# Patient Record
Sex: Female | Born: 1935
Health system: Southern US, Community
[De-identification: ages and names within clinical notes are randomized; demographics above are authoritative.]

## PROBLEM LIST (undated history)

## (undated) DIAGNOSIS — Z9109 Other allergy status, other than to drugs and biological substances: Secondary | ICD-10-CM

## (undated) DIAGNOSIS — T7840XA Allergy, unspecified, initial encounter: Secondary | ICD-10-CM

## (undated) DIAGNOSIS — M1990 Unspecified osteoarthritis, unspecified site: Secondary | ICD-10-CM

## (undated) DIAGNOSIS — D126 Benign neoplasm of colon, unspecified: Secondary | ICD-10-CM

## (undated) DIAGNOSIS — J449 Chronic obstructive pulmonary disease, unspecified: Secondary | ICD-10-CM

## (undated) DIAGNOSIS — R911 Solitary pulmonary nodule: Secondary | ICD-10-CM

## (undated) DIAGNOSIS — I10 Essential (primary) hypertension: Secondary | ICD-10-CM

## (undated) DIAGNOSIS — F419 Anxiety disorder, unspecified: Secondary | ICD-10-CM

## (undated) DIAGNOSIS — G500 Trigeminal neuralgia: Secondary | ICD-10-CM

## (undated) DIAGNOSIS — B029 Zoster without complications: Secondary | ICD-10-CM

## (undated) DIAGNOSIS — I70209 Unspecified atherosclerosis of native arteries of extremities, unspecified extremity: Secondary | ICD-10-CM

## (undated) DIAGNOSIS — K219 Gastro-esophageal reflux disease without esophagitis: Secondary | ICD-10-CM

## (undated) DIAGNOSIS — K5790 Diverticulosis of intestine, part unspecified, without perforation or abscess without bleeding: Secondary | ICD-10-CM

## (undated) DIAGNOSIS — I739 Peripheral vascular disease, unspecified: Secondary | ICD-10-CM

## (undated) DIAGNOSIS — I82409 Acute embolism and thrombosis of unspecified deep veins of unspecified lower extremity: Secondary | ICD-10-CM

## (undated) DIAGNOSIS — I499 Cardiac arrhythmia, unspecified: Secondary | ICD-10-CM

## (undated) DIAGNOSIS — L309 Dermatitis, unspecified: Secondary | ICD-10-CM

## (undated) DIAGNOSIS — E785 Hyperlipidemia, unspecified: Secondary | ICD-10-CM

## (undated) DIAGNOSIS — K449 Diaphragmatic hernia without obstruction or gangrene: Secondary | ICD-10-CM

## (undated) HISTORY — PX: COLONOSCOPY: SHX174

## (undated) HISTORY — DX: Essential (primary) hypertension: I10

## (undated) HISTORY — DX: Zoster without complications: B02.9

## (undated) HISTORY — DX: Solitary pulmonary nodule: R91.1

## (undated) HISTORY — PX: CATARACT EXTRACTION, BILATERAL: SHX1313

## (undated) HISTORY — DX: Acute embolism and thrombosis of unspecified deep veins of unspecified lower extremity: I82.409

## (undated) HISTORY — DX: Trigeminal neuralgia: G50.0

## (undated) HISTORY — DX: Peripheral vascular disease, unspecified: I73.9

## (undated) HISTORY — DX: Gastro-esophageal reflux disease without esophagitis: K21.9

## (undated) HISTORY — DX: Dermatitis, unspecified: L30.9

## (undated) HISTORY — PX: TONSILLECTOMY: SUR1361

## (undated) HISTORY — PX: TUBAL LIGATION: SHX77

## (undated) HISTORY — DX: Benign neoplasm of colon, unspecified: D12.6

## (undated) HISTORY — DX: Diverticulosis of intestine, part unspecified, without perforation or abscess without bleeding: K57.90

## (undated) HISTORY — DX: Hyperlipidemia, unspecified: E78.5

## (undated) HISTORY — DX: Allergy, unspecified, initial encounter: T78.40XA

## (undated) HISTORY — DX: Unspecified atherosclerosis of native arteries of extremities, unspecified extremity: I70.209

## (undated) HISTORY — DX: Diaphragmatic hernia without obstruction or gangrene: K44.9

---

## 1993-08-09 DIAGNOSIS — K222 Esophageal obstruction: Secondary | ICD-10-CM | POA: Insufficient documentation

## 1996-04-25 HISTORY — PX: CHOLECYSTECTOMY: SHX55

## 1997-09-29 ENCOUNTER — Other Ambulatory Visit: Admission: RE | Admit: 1997-09-29 | Discharge: 1997-09-29 | Payer: Self-pay | Admitting: Internal Medicine

## 1998-12-01 ENCOUNTER — Ambulatory Visit: Admission: RE | Admit: 1998-12-01 | Discharge: 1998-12-01 | Payer: Self-pay

## 1999-01-20 ENCOUNTER — Ambulatory Visit (HOSPITAL_COMMUNITY): Admission: RE | Admit: 1999-01-20 | Discharge: 1999-01-20 | Payer: Self-pay | Admitting: Gastroenterology

## 1999-01-20 ENCOUNTER — Encounter: Payer: Self-pay | Admitting: Gastroenterology

## 1999-03-25 DIAGNOSIS — Z8719 Personal history of other diseases of the digestive system: Secondary | ICD-10-CM | POA: Insufficient documentation

## 1999-08-20 ENCOUNTER — Encounter: Admission: RE | Admit: 1999-08-20 | Discharge: 1999-09-17 | Payer: Self-pay | Admitting: Neurosurgery

## 2000-05-04 ENCOUNTER — Encounter: Admission: RE | Admit: 2000-05-04 | Discharge: 2000-08-02 | Payer: Self-pay | Admitting: Internal Medicine

## 2001-09-10 ENCOUNTER — Other Ambulatory Visit: Admission: RE | Admit: 2001-09-10 | Discharge: 2001-09-10 | Payer: Self-pay | Admitting: Internal Medicine

## 2003-07-04 ENCOUNTER — Encounter: Admission: RE | Admit: 2003-07-04 | Discharge: 2003-07-04 | Payer: Self-pay | Admitting: Internal Medicine

## 2003-08-15 ENCOUNTER — Encounter: Payer: Self-pay | Admitting: Internal Medicine

## 2004-05-04 ENCOUNTER — Ambulatory Visit: Payer: Self-pay | Admitting: Internal Medicine

## 2004-05-17 ENCOUNTER — Ambulatory Visit: Payer: Self-pay | Admitting: Internal Medicine

## 2004-07-05 ENCOUNTER — Ambulatory Visit: Payer: Self-pay | Admitting: Internal Medicine

## 2004-10-25 ENCOUNTER — Ambulatory Visit: Payer: Self-pay | Admitting: Internal Medicine

## 2005-01-10 ENCOUNTER — Ambulatory Visit: Payer: Self-pay | Admitting: Internal Medicine

## 2005-01-31 ENCOUNTER — Ambulatory Visit: Payer: Self-pay

## 2005-02-14 ENCOUNTER — Ambulatory Visit: Payer: Self-pay | Admitting: Internal Medicine

## 2005-02-21 ENCOUNTER — Ambulatory Visit: Payer: Self-pay | Admitting: Internal Medicine

## 2005-05-17 ENCOUNTER — Ambulatory Visit: Payer: Self-pay | Admitting: Internal Medicine

## 2005-05-24 ENCOUNTER — Ambulatory Visit: Payer: Self-pay | Admitting: Internal Medicine

## 2005-08-16 ENCOUNTER — Ambulatory Visit: Payer: Self-pay | Admitting: Internal Medicine

## 2005-10-17 ENCOUNTER — Ambulatory Visit: Payer: Self-pay | Admitting: Internal Medicine

## 2006-01-16 ENCOUNTER — Ambulatory Visit: Payer: Self-pay | Admitting: Internal Medicine

## 2006-04-10 ENCOUNTER — Ambulatory Visit: Payer: Self-pay | Admitting: Internal Medicine

## 2006-04-10 LAB — CONVERTED CEMR LAB
ALT: 16 units/L (ref 0–40)
AST: 20 units/L (ref 0–37)
Albumin: 4.2 g/dL (ref 3.5–5.2)
Chol/HDL Ratio, serum: 3
Cholesterol: 136 mg/dL (ref 0–200)
Folate: >20 ng/mL
HDL: 45.3 mg/dL (ref >39.0)
Total Bilirubin: 0.9 mg/dL (ref 0.3–1.2)

## 2006-04-24 ENCOUNTER — Ambulatory Visit: Payer: Self-pay | Admitting: Internal Medicine

## 2006-06-19 ENCOUNTER — Ambulatory Visit: Payer: Self-pay | Admitting: Internal Medicine

## 2006-07-19 ENCOUNTER — Ambulatory Visit: Payer: Self-pay | Admitting: Gastroenterology

## 2006-08-07 ENCOUNTER — Encounter: Payer: Self-pay | Admitting: Internal Medicine

## 2006-08-07 ENCOUNTER — Ambulatory Visit: Payer: Self-pay | Admitting: Internal Medicine

## 2006-08-21 ENCOUNTER — Ambulatory Visit: Payer: Self-pay | Admitting: Internal Medicine

## 2006-08-29 ENCOUNTER — Ambulatory Visit: Payer: Self-pay | Admitting: Gastroenterology

## 2006-08-29 DIAGNOSIS — D126 Benign neoplasm of colon, unspecified: Secondary | ICD-10-CM

## 2006-08-29 HISTORY — DX: Benign neoplasm of colon, unspecified: D12.6

## 2006-09-11 ENCOUNTER — Ambulatory Visit: Payer: Self-pay | Admitting: Vascular Surgery

## 2006-10-10 ENCOUNTER — Ambulatory Visit: Payer: Self-pay | Admitting: Internal Medicine

## 2006-10-23 ENCOUNTER — Ambulatory Visit: Payer: Self-pay | Admitting: Internal Medicine

## 2006-10-23 DIAGNOSIS — E785 Hyperlipidemia, unspecified: Secondary | ICD-10-CM | POA: Insufficient documentation

## 2006-10-23 DIAGNOSIS — I10 Essential (primary) hypertension: Secondary | ICD-10-CM | POA: Insufficient documentation

## 2006-11-28 ENCOUNTER — Telehealth: Payer: Self-pay | Admitting: Internal Medicine

## 2006-12-04 ENCOUNTER — Ambulatory Visit: Payer: Self-pay | Admitting: Internal Medicine

## 2006-12-04 DIAGNOSIS — F411 Generalized anxiety disorder: Secondary | ICD-10-CM | POA: Insufficient documentation

## 2006-12-04 DIAGNOSIS — K5730 Diverticulosis of large intestine without perforation or abscess without bleeding: Secondary | ICD-10-CM | POA: Insufficient documentation

## 2006-12-04 LAB — CONVERTED CEMR LAB
Albumin: 4.5 g/dL (ref 3.5–5.2)
Alkaline Phosphatase: 70 units/L (ref 39–117)
Basophils Absolute: 0 10*3/uL (ref 0.0–0.1)
Eosinophils Absolute: 0.1 10*3/uL (ref 0.0–0.6)
Lymphocytes Relative: 17.3 % (ref 12.0–46.0)
MCV: 86.9 fL (ref 78.0–100.0)
Monocytes Relative: 7.6 % (ref 3.0–11.0)
Neutro Abs: 6.2 10*3/uL (ref 1.4–7.7)
Platelets: 213 10*3/uL (ref 150–400)

## 2006-12-07 ENCOUNTER — Telehealth (INDEPENDENT_AMBULATORY_CARE_PROVIDER_SITE_OTHER): Payer: Self-pay | Admitting: *Deleted

## 2006-12-07 DIAGNOSIS — R197 Diarrhea, unspecified: Secondary | ICD-10-CM | POA: Insufficient documentation

## 2006-12-08 ENCOUNTER — Ambulatory Visit: Payer: Self-pay | Admitting: Internal Medicine

## 2006-12-11 ENCOUNTER — Encounter: Payer: Self-pay | Admitting: Internal Medicine

## 2006-12-20 ENCOUNTER — Encounter: Payer: Self-pay | Admitting: Internal Medicine

## 2006-12-20 ENCOUNTER — Ambulatory Visit: Payer: Self-pay | Admitting: Internal Medicine

## 2006-12-20 DIAGNOSIS — K5289 Other specified noninfective gastroenteritis and colitis: Secondary | ICD-10-CM | POA: Insufficient documentation

## 2007-02-05 ENCOUNTER — Ambulatory Visit: Payer: Self-pay | Admitting: Internal Medicine

## 2007-02-26 ENCOUNTER — Encounter: Payer: Self-pay | Admitting: Internal Medicine

## 2007-03-05 ENCOUNTER — Ambulatory Visit: Payer: Self-pay | Admitting: Vascular Surgery

## 2007-05-03 ENCOUNTER — Ambulatory Visit: Payer: Self-pay | Admitting: Internal Medicine

## 2007-06-29 DIAGNOSIS — I739 Peripheral vascular disease, unspecified: Secondary | ICD-10-CM | POA: Insufficient documentation

## 2007-08-27 ENCOUNTER — Ambulatory Visit: Payer: Self-pay | Admitting: Internal Medicine

## 2007-08-27 LAB — CONVERTED CEMR LAB
Alkaline Phosphatase: 59 units/L (ref 39–117)
Bilirubin, Direct: 0.1 mg/dL (ref 0.0–0.3)
GFR calc Af Amer: 77 mL/min
GFR calc non Af Amer: 64 mL/min
Glucose, Bld: 126 mg/dL — ABNORMAL HIGH (ref 70–99)
HCT: 40.1 % (ref 36.0–46.0)
HDL: 57.1 mg/dL (ref >39.0)
LDL Cholesterol: 59 mg/dL (ref 0–99)
Lymphocytes Relative: 18.6 % (ref 12.0–46.0)
Monocytes Absolute: 0.7 10*3/uL (ref 0.1–1.0)
Monocytes Relative: 6.2 % (ref 3.0–12.0)
Platelets: 182 10*3/uL (ref 150–400)
Potassium: 4.4 meq/L (ref 3.5–5.1)
RDW: 13.1 % (ref 11.5–14.6)
Sodium: 143 meq/L (ref 135–145)
Total CHOL/HDL Ratio: 2.6
Total Protein: 6.9 g/dL (ref 6.0–8.3)
Triglycerides: 176 mg/dL — ABNORMAL HIGH (ref 0–149)
VLDL: 35 mg/dL (ref 0–40)

## 2007-09-07 ENCOUNTER — Ambulatory Visit: Payer: Self-pay | Admitting: Vascular Surgery

## 2007-11-26 ENCOUNTER — Ambulatory Visit: Payer: Self-pay | Admitting: Internal Medicine

## 2007-11-26 DIAGNOSIS — L309 Dermatitis, unspecified: Secondary | ICD-10-CM | POA: Insufficient documentation

## 2007-11-26 DIAGNOSIS — B0229 Other postherpetic nervous system involvement: Secondary | ICD-10-CM | POA: Insufficient documentation

## 2008-01-30 ENCOUNTER — Telehealth: Payer: Self-pay | Admitting: Internal Medicine

## 2008-02-07 ENCOUNTER — Ambulatory Visit: Payer: Self-pay | Admitting: Internal Medicine

## 2008-02-22 ENCOUNTER — Telehealth: Payer: Self-pay | Admitting: Internal Medicine

## 2008-02-29 ENCOUNTER — Ambulatory Visit: Payer: Self-pay | Admitting: Vascular Surgery

## 2008-03-03 ENCOUNTER — Encounter: Payer: Self-pay | Admitting: Internal Medicine

## 2008-03-24 ENCOUNTER — Ambulatory Visit: Payer: Self-pay | Admitting: Internal Medicine

## 2008-03-24 LAB — CONVERTED CEMR LAB
ALT: 13 units/L (ref 0–35)
Alkaline Phosphatase: 59 units/L (ref 39–117)
Bilirubin, Direct: 0.1 mg/dL (ref 0.0–0.3)
Eosinophils Relative: 2.7 % (ref 0.0–5.0)
HDL: 56.2 mg/dL (ref >39.0)
LDL Cholesterol: 58 mg/dL (ref 0–99)
Lymphocytes Relative: 25.6 % (ref 12.0–46.0)
Monocytes Relative: 7 % (ref 3.0–12.0)
Neutrophils Relative %: 63.8 % (ref 43.0–77.0)
Platelets: 176 10*3/uL (ref 150–400)
Total CHOL/HDL Ratio: 2.6
Total Protein: 6.6 g/dL (ref 6.0–8.3)
VLDL: 30 mg/dL (ref 0–40)
WBC: 7.7 10*3/uL (ref 4.5–10.5)

## 2008-03-31 ENCOUNTER — Ambulatory Visit: Payer: Self-pay | Admitting: Internal Medicine

## 2008-03-31 DIAGNOSIS — J309 Allergic rhinitis, unspecified: Secondary | ICD-10-CM | POA: Insufficient documentation

## 2008-04-26 LAB — HM DEXA SCAN

## 2008-06-30 ENCOUNTER — Ambulatory Visit: Payer: Self-pay | Admitting: Internal Medicine

## 2008-08-04 ENCOUNTER — Ambulatory Visit: Payer: Self-pay | Admitting: Internal Medicine

## 2008-08-04 DIAGNOSIS — H1045 Other chronic allergic conjunctivitis: Secondary | ICD-10-CM | POA: Insufficient documentation

## 2008-08-04 LAB — CONVERTED CEMR LAB
Basophils Absolute: 0.5 10*3/uL — ABNORMAL HIGH (ref 0.0–0.1)
Eosinophils Absolute: 0.2 10*3/uL (ref 0.0–0.7)
Lymphocytes Relative: 17.3 % (ref 12.0–46.0)
MCHC: 34.7 g/dL (ref 30.0–36.0)
Monocytes Absolute: 0.5 10*3/uL (ref 0.1–1.0)
Neutro Abs: 7.3 10*3/uL (ref 1.4–7.7)
Neutrophils Relative %: 71.4 % (ref 43.0–77.0)
RDW: 12.7 % (ref 11.5–14.6)

## 2008-08-29 ENCOUNTER — Ambulatory Visit: Payer: Self-pay | Admitting: Vascular Surgery

## 2008-09-01 ENCOUNTER — Ambulatory Visit: Payer: Self-pay | Admitting: Internal Medicine

## 2008-09-08 ENCOUNTER — Telehealth: Payer: Self-pay | Admitting: *Deleted

## 2008-09-11 ENCOUNTER — Ambulatory Visit: Payer: Self-pay | Admitting: Internal Medicine

## 2008-09-15 ENCOUNTER — Ambulatory Visit: Payer: Self-pay | Admitting: Internal Medicine

## 2008-09-16 ENCOUNTER — Ambulatory Visit: Payer: Self-pay | Admitting: Internal Medicine

## 2008-09-16 ENCOUNTER — Encounter: Payer: Self-pay | Admitting: Internal Medicine

## 2008-10-10 ENCOUNTER — Ambulatory Visit: Payer: Self-pay | Admitting: Internal Medicine

## 2008-10-10 ENCOUNTER — Telehealth (INDEPENDENT_AMBULATORY_CARE_PROVIDER_SITE_OTHER): Payer: Self-pay | Admitting: *Deleted

## 2008-10-13 ENCOUNTER — Ambulatory Visit: Payer: Self-pay | Admitting: Internal Medicine

## 2008-10-17 ENCOUNTER — Ambulatory Visit: Payer: Self-pay | Admitting: Internal Medicine

## 2008-10-20 ENCOUNTER — Ambulatory Visit: Payer: Self-pay | Admitting: Internal Medicine

## 2008-10-24 ENCOUNTER — Ambulatory Visit: Payer: Self-pay | Admitting: Internal Medicine

## 2008-10-28 ENCOUNTER — Ambulatory Visit: Payer: Self-pay | Admitting: Internal Medicine

## 2008-10-31 ENCOUNTER — Ambulatory Visit: Payer: Self-pay | Admitting: Internal Medicine

## 2008-11-04 ENCOUNTER — Ambulatory Visit: Payer: Self-pay | Admitting: Internal Medicine

## 2008-11-05 ENCOUNTER — Telehealth: Payer: Self-pay | Admitting: Internal Medicine

## 2008-11-05 ENCOUNTER — Ambulatory Visit: Payer: Self-pay | Admitting: Internal Medicine

## 2008-11-05 DIAGNOSIS — M25559 Pain in unspecified hip: Secondary | ICD-10-CM | POA: Insufficient documentation

## 2008-11-07 ENCOUNTER — Ambulatory Visit: Payer: Self-pay | Admitting: Internal Medicine

## 2008-11-10 ENCOUNTER — Ambulatory Visit: Payer: Self-pay | Admitting: Internal Medicine

## 2008-11-14 ENCOUNTER — Inpatient Hospital Stay (HOSPITAL_COMMUNITY): Admission: RE | Admit: 2008-11-14 | Discharge: 2008-11-18 | Payer: Self-pay | Admitting: Vascular Surgery

## 2008-11-14 ENCOUNTER — Ambulatory Visit: Payer: Self-pay | Admitting: Vascular Surgery

## 2008-11-24 ENCOUNTER — Ambulatory Visit: Payer: Self-pay | Admitting: Internal Medicine

## 2008-11-26 ENCOUNTER — Ambulatory Visit: Payer: Self-pay | Admitting: Vascular Surgery

## 2008-11-28 ENCOUNTER — Ambulatory Visit: Payer: Self-pay | Admitting: Internal Medicine

## 2008-12-01 ENCOUNTER — Ambulatory Visit: Payer: Self-pay | Admitting: Internal Medicine

## 2008-12-04 ENCOUNTER — Telehealth: Payer: Self-pay | Admitting: Internal Medicine

## 2008-12-12 ENCOUNTER — Ambulatory Visit: Payer: Self-pay | Admitting: Internal Medicine

## 2008-12-16 ENCOUNTER — Ambulatory Visit: Payer: Self-pay | Admitting: Internal Medicine

## 2008-12-19 ENCOUNTER — Ambulatory Visit: Payer: Self-pay | Admitting: Internal Medicine

## 2008-12-22 ENCOUNTER — Ambulatory Visit: Payer: Self-pay | Admitting: Internal Medicine

## 2008-12-26 ENCOUNTER — Ambulatory Visit: Payer: Self-pay | Admitting: Internal Medicine

## 2008-12-30 ENCOUNTER — Ambulatory Visit: Payer: Self-pay | Admitting: Internal Medicine

## 2008-12-30 LAB — CONVERTED CEMR LAB
AST: 19 units/L (ref 0–37)
BUN: 15 mg/dL (ref 6–23)
Basophils Relative: 0.8 % (ref 0.0–3.0)
Calcium: 9.7 mg/dL (ref 8.4–10.5)
Eosinophils Relative: 2.6 % (ref 0.0–5.0)
GFR calc non Af Amer: 63.69 mL/min (ref >60)
HCT: 37.1 % (ref 36.0–46.0)
Hemoglobin: 12.9 g/dL (ref 12.0–15.0)
LDL Cholesterol: 53 mg/dL (ref 0–99)
Lymphs Abs: 1.7 10*3/uL (ref 0.7–4.0)
MCV: 85.7 fL (ref 78.0–100.0)
Monocytes Absolute: 0.5 10*3/uL (ref 0.1–1.0)
Monocytes Relative: 6.6 % (ref 3.0–12.0)
Neutro Abs: 5.4 10*3/uL (ref 1.4–7.7)
Platelets: 182 10*3/uL (ref 150.0–400.0)
Potassium: 4.5 meq/L (ref 3.5–5.1)
Sodium: 142 meq/L (ref 135–145)
TSH: 3.4 microintl units/mL (ref 0.35–5.50)
Total Bilirubin: 0.7 mg/dL (ref 0.3–1.2)
Total CHOL/HDL Ratio: 3
VLDL: 30.2 mg/dL (ref 0.0–40.0)
WBC: 7.9 10*3/uL (ref 4.5–10.5)

## 2009-01-02 ENCOUNTER — Ambulatory Visit: Payer: Self-pay | Admitting: Internal Medicine

## 2009-01-06 ENCOUNTER — Ambulatory Visit: Payer: Self-pay | Admitting: Internal Medicine

## 2009-01-06 DIAGNOSIS — M8580 Other specified disorders of bone density and structure, unspecified site: Secondary | ICD-10-CM | POA: Insufficient documentation

## 2009-01-06 DIAGNOSIS — E8881 Metabolic syndrome: Secondary | ICD-10-CM | POA: Insufficient documentation

## 2009-01-09 ENCOUNTER — Ambulatory Visit: Payer: Self-pay | Admitting: Internal Medicine

## 2009-01-13 ENCOUNTER — Ambulatory Visit: Payer: Self-pay | Admitting: Internal Medicine

## 2009-01-16 ENCOUNTER — Ambulatory Visit: Payer: Self-pay | Admitting: Internal Medicine

## 2009-01-20 ENCOUNTER — Ambulatory Visit: Payer: Self-pay | Admitting: Internal Medicine

## 2009-01-21 ENCOUNTER — Ambulatory Visit: Payer: Self-pay | Admitting: Internal Medicine

## 2009-01-23 ENCOUNTER — Ambulatory Visit: Payer: Self-pay | Admitting: Internal Medicine

## 2009-01-27 ENCOUNTER — Ambulatory Visit: Payer: Self-pay | Admitting: Internal Medicine

## 2009-01-29 ENCOUNTER — Ambulatory Visit: Payer: Self-pay | Admitting: Internal Medicine

## 2009-01-30 ENCOUNTER — Ambulatory Visit: Payer: Self-pay | Admitting: Internal Medicine

## 2009-02-04 ENCOUNTER — Ambulatory Visit: Payer: Self-pay | Admitting: Internal Medicine

## 2009-02-06 ENCOUNTER — Ambulatory Visit: Payer: Self-pay | Admitting: Internal Medicine

## 2009-02-13 ENCOUNTER — Ambulatory Visit: Payer: Self-pay | Admitting: Internal Medicine

## 2009-02-20 ENCOUNTER — Ambulatory Visit: Payer: Self-pay | Admitting: Internal Medicine

## 2009-02-27 ENCOUNTER — Ambulatory Visit: Payer: Self-pay | Admitting: Internal Medicine

## 2009-03-03 ENCOUNTER — Ambulatory Visit: Payer: Self-pay | Admitting: Vascular Surgery

## 2009-03-06 ENCOUNTER — Ambulatory Visit: Payer: Self-pay | Admitting: Internal Medicine

## 2009-03-09 ENCOUNTER — Encounter (INDEPENDENT_AMBULATORY_CARE_PROVIDER_SITE_OTHER): Payer: Self-pay | Admitting: *Deleted

## 2009-03-13 ENCOUNTER — Ambulatory Visit: Payer: Self-pay | Admitting: Internal Medicine

## 2009-03-20 ENCOUNTER — Ambulatory Visit: Payer: Self-pay | Admitting: Internal Medicine

## 2009-03-27 ENCOUNTER — Ambulatory Visit: Payer: Self-pay | Admitting: Internal Medicine

## 2009-04-03 ENCOUNTER — Ambulatory Visit: Payer: Self-pay | Admitting: Internal Medicine

## 2009-04-04 ENCOUNTER — Ambulatory Visit: Payer: Self-pay | Admitting: Vascular Surgery

## 2009-04-04 ENCOUNTER — Inpatient Hospital Stay (HOSPITAL_COMMUNITY): Admission: EM | Admit: 2009-04-04 | Discharge: 2009-04-10 | Payer: Self-pay | Admitting: Emergency Medicine

## 2009-04-09 ENCOUNTER — Ambulatory Visit: Payer: Self-pay | Admitting: Physical Medicine & Rehabilitation

## 2009-04-10 ENCOUNTER — Telehealth: Payer: Self-pay | Admitting: *Deleted

## 2009-04-13 ENCOUNTER — Telehealth (INDEPENDENT_AMBULATORY_CARE_PROVIDER_SITE_OTHER): Payer: Self-pay | Admitting: *Deleted

## 2009-04-23 ENCOUNTER — Ambulatory Visit: Payer: Self-pay | Admitting: Vascular Surgery

## 2009-04-24 ENCOUNTER — Encounter: Payer: Self-pay | Admitting: Internal Medicine

## 2009-04-25 DIAGNOSIS — R911 Solitary pulmonary nodule: Secondary | ICD-10-CM

## 2009-04-25 HISTORY — DX: Solitary pulmonary nodule: R91.1

## 2009-05-08 ENCOUNTER — Ambulatory Visit: Payer: Self-pay | Admitting: Internal Medicine

## 2009-05-15 ENCOUNTER — Ambulatory Visit: Payer: Self-pay | Admitting: Internal Medicine

## 2009-05-19 ENCOUNTER — Ambulatory Visit: Payer: Self-pay | Admitting: Internal Medicine

## 2009-05-19 LAB — CONVERTED CEMR LAB
Bilirubin, Direct: 0 mg/dL (ref 0.0–0.3)
Cholesterol: 170 mg/dL (ref 0–200)
Hgb A1c MFr Bld: 5.9 % (ref 4.6–6.5)
Total Bilirubin: 0.5 mg/dL (ref 0.3–1.2)
Total CHOL/HDL Ratio: 3
Triglycerides: 224 mg/dL — ABNORMAL HIGH (ref 0.0–149.0)

## 2009-05-22 ENCOUNTER — Ambulatory Visit: Payer: Self-pay | Admitting: Internal Medicine

## 2009-05-25 ENCOUNTER — Ambulatory Visit: Payer: Self-pay | Admitting: Internal Medicine

## 2009-05-25 LAB — CONVERTED CEMR LAB
Cholesterol, target level: 200 mg/dL
LDL Goal: 100 mg/dL

## 2009-05-29 ENCOUNTER — Ambulatory Visit: Payer: Self-pay | Admitting: Internal Medicine

## 2009-06-01 ENCOUNTER — Ambulatory Visit: Payer: Self-pay | Admitting: Internal Medicine

## 2009-06-05 ENCOUNTER — Ambulatory Visit: Payer: Self-pay | Admitting: Internal Medicine

## 2009-06-05 ENCOUNTER — Telehealth: Payer: Self-pay | Admitting: Internal Medicine

## 2009-06-12 ENCOUNTER — Ambulatory Visit: Payer: Self-pay | Admitting: Internal Medicine

## 2009-06-19 ENCOUNTER — Ambulatory Visit: Payer: Self-pay | Admitting: Internal Medicine

## 2009-06-26 ENCOUNTER — Ambulatory Visit: Payer: Self-pay | Admitting: Internal Medicine

## 2009-07-08 ENCOUNTER — Ambulatory Visit: Payer: Self-pay | Admitting: Vascular Surgery

## 2009-07-10 ENCOUNTER — Ambulatory Visit: Payer: Self-pay | Admitting: Internal Medicine

## 2009-07-17 ENCOUNTER — Ambulatory Visit: Payer: Self-pay | Admitting: Internal Medicine

## 2009-07-24 ENCOUNTER — Ambulatory Visit: Payer: Self-pay | Admitting: Internal Medicine

## 2009-07-31 ENCOUNTER — Ambulatory Visit: Payer: Self-pay | Admitting: Internal Medicine

## 2009-08-07 ENCOUNTER — Ambulatory Visit: Payer: Self-pay | Admitting: Internal Medicine

## 2009-08-18 ENCOUNTER — Ambulatory Visit: Payer: Self-pay | Admitting: Internal Medicine

## 2009-08-18 LAB — CONVERTED CEMR LAB
BUN: 15 mg/dL (ref 6–23)
CO2: 32 meq/L (ref 19–32)
Chloride: 103 meq/L (ref 96–112)
Creatinine, Ser: 0.9 mg/dL (ref 0.4–1.2)
Eosinophils Relative: 3.6 % (ref 0.0–5.0)
HCT: 38.1 % (ref 36.0–46.0)
Hemoglobin: 13 g/dL (ref 12.0–15.0)
Lymphs Abs: 1.7 10*3/uL (ref 0.7–4.0)
MCV: 85.6 fL (ref 78.0–100.0)
Monocytes Absolute: 0.5 10*3/uL (ref 0.1–1.0)
Monocytes Relative: 6.5 % (ref 3.0–12.0)
Neutro Abs: 5.8 10*3/uL (ref 1.4–7.7)
Platelets: 184 10*3/uL (ref 150.0–400.0)
Potassium: 5.1 meq/L (ref 3.5–5.1)
RDW: 13.9 % (ref 11.5–14.6)
TSH: 3.85 microintl units/mL (ref 0.35–5.50)
WBC: 8.4 10*3/uL (ref 4.5–10.5)

## 2009-08-21 ENCOUNTER — Ambulatory Visit: Payer: Self-pay | Admitting: Internal Medicine

## 2009-08-24 ENCOUNTER — Ambulatory Visit: Payer: Self-pay | Admitting: Internal Medicine

## 2009-08-28 ENCOUNTER — Ambulatory Visit: Payer: Self-pay | Admitting: Internal Medicine

## 2009-09-04 ENCOUNTER — Ambulatory Visit: Payer: Self-pay | Admitting: Internal Medicine

## 2009-09-11 ENCOUNTER — Ambulatory Visit: Payer: Self-pay | Admitting: Internal Medicine

## 2009-09-18 ENCOUNTER — Ambulatory Visit: Payer: Self-pay | Admitting: Internal Medicine

## 2009-09-25 ENCOUNTER — Telehealth (INDEPENDENT_AMBULATORY_CARE_PROVIDER_SITE_OTHER): Payer: Self-pay | Admitting: *Deleted

## 2009-09-25 ENCOUNTER — Ambulatory Visit: Payer: Self-pay | Admitting: Internal Medicine

## 2009-09-30 ENCOUNTER — Encounter: Payer: Self-pay | Admitting: Internal Medicine

## 2009-10-02 ENCOUNTER — Ambulatory Visit: Payer: Self-pay | Admitting: Internal Medicine

## 2009-10-09 ENCOUNTER — Ambulatory Visit: Payer: Self-pay | Admitting: Internal Medicine

## 2009-10-12 ENCOUNTER — Ambulatory Visit: Payer: Self-pay | Admitting: Internal Medicine

## 2009-10-16 ENCOUNTER — Ambulatory Visit: Payer: Self-pay | Admitting: Internal Medicine

## 2009-10-23 ENCOUNTER — Ambulatory Visit: Payer: Self-pay | Admitting: Internal Medicine

## 2009-10-30 ENCOUNTER — Ambulatory Visit: Payer: Self-pay | Admitting: Internal Medicine

## 2009-11-13 ENCOUNTER — Ambulatory Visit: Payer: Self-pay | Admitting: Internal Medicine

## 2009-11-20 ENCOUNTER — Ambulatory Visit: Payer: Self-pay | Admitting: Internal Medicine

## 2009-11-24 ENCOUNTER — Ambulatory Visit: Payer: Self-pay | Admitting: Internal Medicine

## 2009-11-24 LAB — CONVERTED CEMR LAB
Hgb A1c MFr Bld: 5.9 % (ref 4.6–6.5)
LDL Cholesterol: 77 mg/dL (ref 0–99)
TSH: 3.56 microintl units/mL (ref 0.35–5.50)
VLDL: 33.2 mg/dL (ref 0.0–40.0)

## 2009-11-27 ENCOUNTER — Ambulatory Visit: Payer: Self-pay | Admitting: Internal Medicine

## 2009-11-27 ENCOUNTER — Ambulatory Visit: Payer: Self-pay | Admitting: Family Medicine

## 2009-11-27 DIAGNOSIS — L03221 Cellulitis of neck: Secondary | ICD-10-CM

## 2009-11-27 DIAGNOSIS — L0211 Cutaneous abscess of neck: Secondary | ICD-10-CM | POA: Insufficient documentation

## 2009-12-01 ENCOUNTER — Ambulatory Visit: Payer: Self-pay | Admitting: Internal Medicine

## 2009-12-04 ENCOUNTER — Ambulatory Visit: Payer: Self-pay | Admitting: Internal Medicine

## 2009-12-11 ENCOUNTER — Ambulatory Visit: Payer: Self-pay | Admitting: Internal Medicine

## 2009-12-15 ENCOUNTER — Telehealth: Payer: Self-pay | Admitting: Internal Medicine

## 2009-12-18 ENCOUNTER — Ambulatory Visit: Payer: Self-pay | Admitting: Internal Medicine

## 2009-12-25 ENCOUNTER — Ambulatory Visit: Payer: Self-pay | Admitting: Internal Medicine

## 2009-12-30 ENCOUNTER — Encounter: Payer: Self-pay | Admitting: Internal Medicine

## 2010-01-01 ENCOUNTER — Ambulatory Visit: Payer: Self-pay | Admitting: Internal Medicine

## 2010-01-08 ENCOUNTER — Ambulatory Visit: Payer: Self-pay | Admitting: Internal Medicine

## 2010-01-11 ENCOUNTER — Telehealth: Payer: Self-pay | Admitting: Internal Medicine

## 2010-01-11 ENCOUNTER — Ambulatory Visit: Payer: Self-pay | Admitting: Family Medicine

## 2010-01-11 DIAGNOSIS — Z87891 Personal history of nicotine dependence: Secondary | ICD-10-CM | POA: Insufficient documentation

## 2010-01-12 ENCOUNTER — Telehealth (INDEPENDENT_AMBULATORY_CARE_PROVIDER_SITE_OTHER): Payer: Self-pay | Admitting: *Deleted

## 2010-01-12 ENCOUNTER — Encounter: Payer: Self-pay | Admitting: Internal Medicine

## 2010-01-12 ENCOUNTER — Telehealth: Payer: Self-pay | Admitting: Family Medicine

## 2010-01-12 DIAGNOSIS — R911 Solitary pulmonary nodule: Secondary | ICD-10-CM | POA: Insufficient documentation

## 2010-01-14 ENCOUNTER — Ambulatory Visit: Payer: Self-pay | Admitting: Internal Medicine

## 2010-01-15 ENCOUNTER — Ambulatory Visit: Payer: Self-pay | Admitting: Internal Medicine

## 2010-01-18 ENCOUNTER — Encounter: Payer: Self-pay | Admitting: Internal Medicine

## 2010-01-18 ENCOUNTER — Telehealth: Payer: Self-pay | Admitting: Internal Medicine

## 2010-01-22 ENCOUNTER — Ambulatory Visit: Payer: Self-pay | Admitting: Internal Medicine

## 2010-01-22 ENCOUNTER — Telehealth: Payer: Self-pay | Admitting: Internal Medicine

## 2010-01-22 DIAGNOSIS — J449 Chronic obstructive pulmonary disease, unspecified: Secondary | ICD-10-CM | POA: Insufficient documentation

## 2010-01-29 ENCOUNTER — Ambulatory Visit: Payer: Self-pay | Admitting: Internal Medicine

## 2010-02-01 ENCOUNTER — Ambulatory Visit: Payer: Self-pay | Admitting: Vascular Surgery

## 2010-02-03 ENCOUNTER — Ambulatory Visit: Payer: Self-pay | Admitting: Critical Care Medicine

## 2010-02-05 ENCOUNTER — Ambulatory Visit: Payer: Self-pay | Admitting: Internal Medicine

## 2010-02-11 ENCOUNTER — Ambulatory Visit (HOSPITAL_COMMUNITY): Admission: RE | Admit: 2010-02-11 | Discharge: 2010-02-11 | Payer: Self-pay | Admitting: Critical Care Medicine

## 2010-02-11 ENCOUNTER — Encounter: Payer: Self-pay | Admitting: Critical Care Medicine

## 2010-02-12 ENCOUNTER — Ambulatory Visit: Payer: Self-pay | Admitting: Internal Medicine

## 2010-02-19 ENCOUNTER — Ambulatory Visit: Payer: Self-pay | Admitting: Internal Medicine

## 2010-02-19 ENCOUNTER — Encounter (INDEPENDENT_AMBULATORY_CARE_PROVIDER_SITE_OTHER): Payer: Self-pay | Admitting: Ophthalmology

## 2010-02-26 ENCOUNTER — Ambulatory Visit: Payer: Self-pay | Admitting: Internal Medicine

## 2010-03-02 ENCOUNTER — Ambulatory Visit: Payer: Self-pay | Admitting: Internal Medicine

## 2010-03-05 ENCOUNTER — Ambulatory Visit: Payer: Self-pay | Admitting: Internal Medicine

## 2010-03-11 ENCOUNTER — Encounter: Payer: Self-pay | Admitting: Internal Medicine

## 2010-03-17 ENCOUNTER — Ambulatory Visit: Payer: Self-pay | Admitting: Internal Medicine

## 2010-03-26 ENCOUNTER — Ambulatory Visit: Payer: Self-pay | Admitting: Internal Medicine

## 2010-04-01 ENCOUNTER — Ambulatory Visit: Payer: Self-pay | Admitting: Internal Medicine

## 2010-04-23 ENCOUNTER — Ambulatory Visit: Payer: Self-pay | Admitting: Internal Medicine

## 2010-04-27 ENCOUNTER — Ambulatory Visit: Payer: Self-pay | Admitting: Internal Medicine

## 2010-05-06 ENCOUNTER — Ambulatory Visit: Admit: 2010-05-06 | Payer: Self-pay | Admitting: Surgery

## 2010-05-06 ENCOUNTER — Ambulatory Visit
Admission: RE | Admit: 2010-05-06 | Discharge: 2010-05-06 | Payer: Self-pay | Source: Home / Self Care | Attending: Surgery | Admitting: Surgery

## 2010-05-10 ENCOUNTER — Ambulatory Visit: Payer: Self-pay | Admitting: Internal Medicine

## 2010-05-14 ENCOUNTER — Ambulatory Visit: Payer: Self-pay | Admitting: Internal Medicine

## 2010-05-20 ENCOUNTER — Ambulatory Visit: Payer: Self-pay | Admitting: Internal Medicine

## 2010-05-23 ENCOUNTER — Ambulatory Visit: Payer: Self-pay | Admitting: Internal Medicine

## 2010-05-25 ENCOUNTER — Other Ambulatory Visit: Payer: Self-pay | Admitting: Critical Care Medicine

## 2010-05-25 ENCOUNTER — Telehealth (INDEPENDENT_AMBULATORY_CARE_PROVIDER_SITE_OTHER): Payer: Self-pay | Admitting: *Deleted

## 2010-05-25 DIAGNOSIS — R911 Solitary pulmonary nodule: Secondary | ICD-10-CM

## 2010-05-27 NOTE — Miscellaneous (Signed)
Summary: Health Care Power of [facility] Power of Attorney   Imported By: Maryln Gottron 01/28/2010 09:21:50  _____________________________________________________________________  External Attachment:    Type:   Image     Comment:   External Document

## 2010-05-27 NOTE — Progress Notes (Signed)
Summary: Test results  Phone Note Call from Patient   Summary of Call: Pt called requesting test results? Initial call taken by: Josph Macho RMA,  January 12, 2010 1:50 PM  Follow-up for Phone Call        so I forwarded the results to her PMD to ask what they would like done next and have not heard back yet. The CXR does not show any acute process but does show a right sided nodule that is stable since 12/10 but new from 7/10. Needs a CXR in about 10-12 weeks after treatment to assess stability and may consider a CT of chest if symptoms worsen or do not resolve Follow-up by: Danise Edge MD,  January 12, 2010 2:24 PM  Additional Follow-up for Phone Call Additional follow up Details #1::        Patient informed. Additional Follow-up by: Josph Macho RMA,  January 12, 2010 2:51 PM

## 2010-05-27 NOTE — Miscellaneous (Signed)
Summary: Injection Record / [facility]    Injection Record / [facility]    Imported By: Lennie Odor 03/02/2010 15:07:32  _____________________________________________________________________  External Attachment:    Type:   Image     Comment:   External Document

## 2010-05-27 NOTE — Assessment & Plan Note (Signed)
Summary: pt due per reminder letter/mhh   Copy to:  Dr. Darryll Capers Primary Provider/Referring Provider:  Darryll Capers, MD  CC:  Yearly follow up visit-allergies;still having watery eyes.Marland Kitchen  History of Present Illness: acrimation and rhinorhea .Had seen Dr Corinda Gubler at this office years ago. Skin tested but never tried vaccine. Sensitive to odors from fabric, dyes, grass mowing, soaps, detergents. Latex causes topical rash but no problem from contrast dye or aspirin. Says basic pattern present x 20 years but getting worse. Tears from eyes drop onto food. Went to an Radio producer at [employer] years ago who opened her lacrimal ducts with no benefit.  Some days laryngitis, ears ok, denies wheeze, cough or hx asthma. Tried many different nasal sprays and eyedrops. Cortisone helps eczema but not eyes or nose.  09/11/08- Allergic conjunctiviitis, rhinitis, Eczema Main c/o is localized burning itching and watering eyes and nose  Watering eyes had improved greatly as she built allergy vaccine without problems. Sense of smell is better. In last 2 weeks wind and Fall have brought some back.Ipratropium nasal spray helps. Caught a cold at work- resolved several days ago. Severe eczema has resolved.  April 01, 2010- Allergic conjunctiviitis, rhinitis, Eczema, Lung nodules Nurse-CC: Yearly follow up visit-allergies;still having watery eyes. She credits her allery vaccine with reduced nasal congestion. Going in and out of doors or walking past detergents makes her nose water. Says this watering got worse after eye surgery at [facility] 15 years ago "to open up my eyes". Denes glaucoma. Little sneeze or itch. Denies wheeze. Never used a rescue inhaler sample.  She is now working with Dr Delford Field, tracking right lung nodules that appear chronic now. She will follow up with him.   Preventive Screening-Counseling & Management  Alcohol-Tobacco     Smoking Status: quit > 6 months     Year Quit: 1995     Pack  years: 67     Tobacco Counseling: to remain off tobacco products  Current Medications (verified): 1)  Calcium-Vitamin D 500-125 Mg-Unit Tabs (Calcium-Vitamin D) .... Take 1 Tablet By Mouth Two Times A Day 2)  Diovan Hct 160-25 Mg Tabs (Valsartan-Hydrochlorothiazide) .... One By Mouth Daily 3)  Simvastatin 40 Mg Tabs (Simvastatin) .Marland Kitchen.. 1 Once Daily 4)  Carbatrol 100 Mg Xr12h-Cap (Carbamazepine) .... Take 1 Capsule By Mouth At Bedtime 5)  Vitamin D 1000 Unit  Caps (Cholecalciferol) .... Once Daily 6)  Prilosec 20 Mg  Cpdr (Omeprazole) .... Once Daily 7)  Ecotrin 325 Mg Tbec (Aspirin) .Marland Kitchen.. 1 Once Daily 8)  Multivitamins   Caps (Multiple Vitamin) .... Once Daily 9)  Allergy Vaccine 1:10 Gh .... From 1:50 At Next Order. 10)  Aleve 220 Mg Tabs (Naproxen Sodium) .... Take 1-2 By Mouth Once Daily As Needed Pain  Allergies (verified): 1)  Sulfamethoxazole (Sulfamethoxazole) 2)  * Latex  Past History:  Past Surgical History: Last updated: 08/04/2008 s/p fem pop bypass with gortex grafts (1996) Tubal ligation Cholecystectomy (1998) Tonsillectomy  Family History: Last updated: 08/04/2008 Family History of CAD Female 1st degree relative <50 father Family History of Breast Cancer: Sister x 2 brother/sister- lung cancer Family History of Colon Cancer: Sister Family History of Colon Polyps: Father Children- allergies Arthritis- mother  Social History: Last updated: 02/03/2010 Retired from office work Former Smoker- 2 ppd x 30 yrs, quit approx 1995 Alcohol Use - no Illicit Drug Use - no\par alone, widowed , part-time Agricultural consultant work at [employer]   Risk Factors: Smoking Status: quit > 6 months (04/01/2010)  Past Medical History: GERD Hyperlipidemia Hypertension popliteal stenosis Colonic polyps, hx of Diverticulosis, colon ( chronic diarrhea now subsided) Shingles Eczema (DR. Terri Piedra). Latex- contact rash Allergic rhinoconjunctivitis- ST Pos 09/11/08 Lung nodule- 2011  Review of  Systems      See HPI  The patient denies shortness of breath with activity, shortness of breath at rest, productive cough, non-productive cough, coughing up blood, chest pain, irregular heartbeats, acid heartburn, indigestion, loss of appetite, weight change, abdominal pain, difficulty swallowing, sore throat, tooth/dental problems, headaches, nasal congestion/difficulty breathing through nose, and sneezing.    Vital Signs:  Patient profile:   77 year old female Height:      62 inches Weight:      179.13 pounds BMI:     32.88 O2 Sat:      95 % on Room air Pulse rate:   62 / minute BP sitting:   138 / 60  (left arm) Cuff size:   large  Vitals Entered By: Reynaldo Minium CMA (April 01, 2010 10:55 AM)  O2 Flow:  Room air CC: Yearly follow up visit-allergies;still having watery eyes.   Physical Exam  Additional Exam:  General: A/Ox3; pleasant and cooperative, NAD, not tearing now SKIN:.No rash  NODES: no lymphadenopathy HEENT: [place]/AT, EOM- WNL, Conjuctivae-minor erythema, PERRLA, TM-WNL, Nose- clear, some pallor on inferior trubinates, sniffing, Throat- clear and wnl, dentures, Mallampati  II NECK: Supple w/ fair ROM, JVD- none, normal carotid impulses w/o bruits  CHEST: Clear to P&A, HEART: RRR, no m/g/r heard ABDOMEN: soft EXT: no edema NEURO: Grossly intact to observation      Impression & Recommendations:  Problem # 1:  ALLERGIC RHINITIS CAUSE UNSPECIFIED (ICD-477.9)  She continues allergy vaccine. i think her complaint of watering eyes and nose when she goes into temperature change reflects vasomotor rhinitis. That is more likely at her age and in this season, than allergic rhinitis and conjunctivitis. . We will see if ipratropium nasal spray helps. She has failed antihistamines and eye drops. She asks about pneumovax- last had it in 2007 so I told her she was covered.   Her updated medication list for this problem includes:    Ipratropium Bromide 0.06 % Soln  (Ipratropium bromide) .Marland Kitchen... 1-2 puffs each nostril up to three times a day when needed.  Problem # 2:  CHRONIC OBSTRUCTIVE PULMONARY DISEASE (ICD-496) I looked back with her at radiology reports, and recommended she look to f/u this with Dr Delford Field as scheduled, since he has started with her on this concern..   Medications Added to Medication List This Visit: 1)  Aleve 220 Mg Tabs (Naproxen sodium) .... Take 1-2 by mouth once daily as needed pain 2)  Ipratropium Bromide 0.06 % Soln (Ipratropium bromide) .Marland Kitchen.. 1-2 puffs each nostril up to three times a day when needed.  Other Orders: Est. Patient Level IV ([address])  Patient Instructions: 1)  Please schedule a follow-up appointment in 4 months. 2)  Script for ipratropium nasal spray to treat watery eyes and nose, sent to [facility].  Prescriptions: IPRATROPIUM BROMIDE 0.06 % SOLN (IPRATROPIUM BROMIDE) 1-2 puffs each nostril up to three times a day when needed.  #1 vial x prn   Entered and Authorized by:   Waymon Budge MD   Signed by:   Waymon Budge MD on 04/01/2010   Method used:   Electronically to        [facility] [phone]* (retail)       [address] [place]  [place], [place]  [address]       Ph: [phone]       Fax: [phone]   RxID:   [id number]

## 2010-05-27 NOTE — Assessment & Plan Note (Signed)
Summary: roa/bmw   Vital Signs:  Patient profile:   77 year old female Height:      62 inches Weight:      167 pounds BMI:     30.66 Temp:     98.2 degrees F oral Pulse rate:   68 / minute Resp:     14 per minute BP sitting:   130 / 80  (left arm)  Vitals Entered By: Willy Eddy, LPN (December 01, 2009 1:35 PM)  Nutrition Counseling: Patient's BMI is greater than 25 and therefore counseled on weight management options. CC: roa- saw dr Abner Greenspan last week for sebacceous type cyst on neck, Hypertension Management Is Patient Diabetic? No  Does patient need assistance? Functional Status Self care Ambulation Normal   Primary Care Provider:  Darryll Capers, MD  CC:  roa- saw dr Abner Greenspan last week for sebacceous type cyst on neck and Hypertension Management.  History of Present Illness: follow up from acute visit from shingles on neck and routine scheduled visit for chronic conditions of HTN and hyperlipidemia and gerd all stable  Follow-Up Visit      This is an 77 year old woman who presents for Follow-up visit.  The patient denies chest pain, palpitations, dizziness, syncope, low blood sugar symptoms, high blood sugar symptoms, edema, SOB, DOE, PND, and orthopnea.  The patient reports taking meds as prescribed.  When questioned about possible medication side effects, the patient notes none.    Hypertension History:      She denies headache, chest pain, palpitations, dyspnea with exertion, orthopnea, PND, peripheral edema, visual symptoms, neurologic problems, syncope, and side effects from treatment.        Positive major cardiovascular risk factors include female age 3 years old or older, hyperlipidemia, and hypertension.  Negative major cardiovascular risk factors include non-tobacco-user status.        Positive history for target organ damage include peripheral vascular disease.  Further assessment for target organ damage reveals no history of stroke/TIA.     Preventive  Screening-Counseling & Management  Alcohol-Tobacco     Smoking Status: quit     Tobacco Counseling: to remain off tobacco products  Problems Prior to Update: 1)  Arm Pain, Right  (ICD-729.5) 2)  Cellulitis and Abscess of Neck  (ICD-682.1) 3)  Osteoarthritis, Moderate  (ICD-715.90) 4)  Impaired Glucose Tolerance  (ICD-271.3) 5)  Osteoporosis  (ICD-733.00) 6)  Hip Pain, Left  (ICD-719.45) 7)  Conjunctivitis, Allergic  (ICD-372.14) 8)  Allergic Rhinitis Cause Unspecified  (ICD-477.9) 9)  Epiphora  (ICD-375.20) 10)  Eczema  (ICD-692.9) 11)  Fh of Colon Cancer  (ICD-153.9) 12)  Postherpetic Neuralgia  (ICD-053.19) 13)  Hiatal Hernia, Hx of  (ICD-V12.79) 14)  Esophageal Stricture  (ICD-530.3) 15)  Duodenitis  (ICD-535.60) 16)  Colonic Polyps, Recurrent  (ICD-211.3) 17)  Colitis  (ICD-558.9) 18)  Shingles, Hx of  (ICD-V13.8) 19)  Peripheral Vascular Disease  (ICD-443.9) 20)  Diarrhea, Chronic  (ICD-787.91) 21)  Anxiety State Nec  (ICD-300.09) 22)  Diverticulitis, Colon W/hem  (ICD-562.13) 23)  Family History of Cad Female 1st Degree Relative <50  (ICD-V17.3) 24)  Diverticulosis, Colon  (ICD-562.10) 25)  Colonic Polyps, Hx of  (ICD-V12.72) 26)  Hypertension  (ICD-401.9) 27)  Hyperlipidemia  (ICD-272.4)  Current Problems (verified): 1)  Tinea Corporis  (ICD-110.5) 2)  Cellulitis and Abscess of Neck  (ICD-682.1) 3)  Osteoarthritis, Moderate  (ICD-715.90) 4)  Foreign Body in Main Bronchus  (ICD-934.1) 5)  Impaired Glucose Tolerance  (ICD-271.3)  6)  Osteoporosis  (ICD-733.00) 7)  Hip Pain, Left  (ICD-719.45) 8)  Conjunctivitis, Allergic  (ICD-372.14) 9)  Allergic Rhinitis Cause Unspecified  (ICD-477.9) 10)  Epiphora  (ICD-375.20) 11)  Eczema  (ICD-692.9) 12)  Fh of Colon Cancer  (ICD-153.9) 13)  Postherpetic Neuralgia  (ICD-053.19) 14)  Hiatal Hernia, Hx of  (ICD-V12.79) 15)  Esophageal Stricture  (ICD-530.3) 16)  Duodenitis  (ICD-535.60) 17)  Colonic Polyps, Recurrent   (ICD-211.3) 18)  Colitis  (ICD-558.9) 19)  Shingles, Hx of  (ICD-V13.8) 20)  Peripheral Vascular Disease  (ICD-443.9) 21)  Trigeminal Neuralgia  (ICD-350.1) 22)  Diarrhea, Chronic  (ICD-787.91) 23)  Anxiety State Nec  (ICD-300.09) 24)  Diverticulitis, Colon W/hem  (ICD-562.13) 25)  Family History of Cad Female 1st Degree Relative <50  (ICD-V17.3) 26)  Diverticulosis, Colon  (ICD-562.10) 27)  Colonic Polyps, Hx of  (ICD-V12.72) 28)  Hypertension  (ICD-401.9) 29)  Hyperlipidemia  (ICD-272.4) 30)  Gerd  (ICD-530.81)  Medications Prior to Update: 1)  Calcium 600/vitamin D 600-400 Mg-Unit  Tabs (Calcium Carbonate-Vitamin D) .... One Ppo Bid 2)  Diovan Hct 160-12.5 Mg  Tabs (Valsartan-Hydrochlorothiazide) .... Once Daily 3)  Simvastatin 40 Mg Tabs (Simvastatin) .Marland Kitchen.. 1 Once Daily 4)  Gabapentin 300 Mg  Caps (Gabapentin) .Marland Kitchen.. 1 At Bedtime 5)  Vitamin D 1000 Unit  Caps (Cholecalciferol) .... Once Daily 6)  Prilosec 20 Mg  Cpdr (Omeprazole) .... Once Daily 7)  Ecotrin 325 Mg Tbec (Aspirin) .Marland Kitchen.. 1 Once Daily 8)  Multivitamins   Caps (Multiple Vitamin) .... Once Daily 9)  Allergy Vaccine 1:10 Gh .... From 1:50 At Next Order. 10)  Tylenol Arthritis Pain 650 Mg Cr-Tabs (Acetaminophen) .Marland Kitchen.. 1-2 By Mouth P Rn Pain 11)  Keflex 500 Mg Caps (Cephalexin) .Marland Kitchen.. 1 Cap By Mouth 4 X Daily X 7 Days 12)  Clotrimazole 1 % Crea (Clotrimazole) .... Apply To Lesions Two Times A Day Until Gone or 30days.  Current Medications (verified): 1)  Calcium 600/vitamin D 600-400 Mg-Unit  Tabs (Calcium Carbonate-Vitamin D) .... One Ppo Bid 2)  Diovan Hct 160-25 Mg Tabs (Valsartan-Hydrochlorothiazide) .... One By Mouth Daily 3)  Simvastatin 40 Mg Tabs (Simvastatin) .Marland Kitchen.. 1 Once Daily 4)  Gabapentin 300 Mg  Caps (Gabapentin) .Marland Kitchen.. 1 At Bedtime 5)  Vitamin D 1000 Unit  Caps (Cholecalciferol) .... Once Daily 6)  Prilosec 20 Mg  Cpdr (Omeprazole) .... Once Daily 7)  Ecotrin 325 Mg Tbec (Aspirin) .Marland Kitchen.. 1 Once Daily 8)   Multivitamins   Caps (Multiple Vitamin) .... Once Daily 9)  Allergy Vaccine 1:10 Gh .... From 1:50 At Next Order. 10)  Tylenol Arthritis Pain 650 Mg Cr-Tabs (Acetaminophen) .Marland Kitchen.. 1-2 By Mouth P Rn Pain 11)  Keflex 500 Mg Caps (Cephalexin) .Marland Kitchen.. 1 Cap By Mouth 4 X Daily X 7 Days 12)  Clotrimazole 1 % Crea (Clotrimazole) .... Apply To Lesions Two Times A Day Until Gone or 30days.  Allergies (verified): 1)  Sulfamethoxazole (Sulfamethoxazole)  Past History:  Family History: Last updated: 08/04/2008 Family History of CAD Female 1st degree relative <50 father Family History of Breast Cancer: Sister x 2 brother/sister- lung cancer Family History of Colon Cancer: Sister Family History of Colon Polyps: Father Children- allergies Arthritis- mother  Social History: Last updated: 08/04/2008 Retired Former Smoker- 2 ppd x 30 yrs, quit approx 1995 Alcohol Use - no Illicit Drug Use - no\par alone, widowed , part-time Agricultural consultant work  Risk Factors: Smoking Status: quit (12/01/2009)  Past medical, surgical, family and social histories (including risk factors)  reviewed, and no changes noted (except as noted below).  Past Medical History: Reviewed history from 09/11/2008 and no changes required. GERD Hyperlipidemia Hypertension popliteal stenosis Colonic polyps, hx of Diverticulosis, colon ( chronic diarrhea now subsided) Shingles Eczema (DR. Terri Piedra). Latex- contact rash Allergicr rhinoconjunctivitis- ST Pos 09/11/08  Past Surgical History: Reviewed history from 08/04/2008 and no changes required. s/p fem pop bypass with gortex grafts (1996) Tubal ligation Cholecystectomy (1998) Tonsillectomy  Family History: Reviewed history from 08/04/2008 and no changes required. Family History of CAD Female 1st degree relative <50 father Family History of Breast Cancer: Sister x 2 brother/sister- lung cancer Family History of Colon Cancer: Sister Family History of Colon Polyps: Father Children-  allergies Arthritis- mother  Social History: Reviewed history from 08/04/2008 and no changes required. Retired Former Smoker- 2 ppd x 30 yrs, quit approx 1995 Alcohol Use - no Illicit Drug Use - no\par alone, widowed , part-time Agricultural consultant work  Review of Systems  The patient denies anorexia, fever, weight loss, weight gain, vision loss, decreased hearing, hoarseness, chest pain, syncope, dyspnea on exertion, peripheral edema, prolonged cough, headaches, hemoptysis, abdominal pain, melena, hematochezia, severe indigestion/heartburn, hematuria, incontinence, genital sores, muscle weakness, suspicious skin lesions, transient blindness, difficulty walking, depression, unusual weight change, abnormal bleeding, enlarged lymph nodes, angioedema, and breast masses.    Physical Exam  General:  Well-developed,well-nourished,in no acute distress; alert,appropriate and cooperative throughout examination Head:  Normocephalic and atraumatic without obvious abnormalities Eyes:  conjunctival injection and excessive tearing.   Ears:  R ear normal and L ear normal.   Mouth:  posterior lymphoid hypertrophy and postnasal drip.   Neck:  No deformities, masses, or tenderness noted shingle rash resolved Lungs:  Normal respiratory effort, chest expands symmetrically. Lungs are clear to auscultation, no crackles or wheezes. Heart:  normal rate.   Abdomen:  soft and non-tender.   Msk:  normal ROM, no redness over joints, and no joint deformities.   Skin:  1 cm macular lesion, erythematous and slightly scaly on left breast, 2 similar lesions on right breast. 2 x 5 cm raised, erythematous patch over left posterior neck from hair line down to base of neck these  lesions are improved and almost completely resolved   Impression & Recommendations:  Problem # 1:  POSTHERPETIC NEURALGIA (ICD-053.19) Assessment Unchanged still with tendernes at the site of the rash had "a shingles vaccine"  in the distant past ( 14  years)  Orders: T- * Misc. Laboratory test [id number])  Problem # 2:  SHINGLES, HX OF (ICD-V13.8) needs to get a vacine if the titer are indeed low  Problem # 3:  HYPERTENSION (ICD-401.9) Assessment: Improved mild recurrent edema Her updated medication list for this problem includes:    Diovan Hct 160-25 Mg Tabs (Valsartan-hydrochlorothiazide) ..... One by mouth daily  BP today: 130/80 Prior BP: 144/72 (11/27/2009)  Prior 10 Yr Risk Heart Disease: 5 % (08/24/2009)  Labs Reviewed: K+: 5.1 (08/18/2009) Creat: : 0.9 (08/18/2009)   Chol: 170 (05/19/2009)   HDL: 63.80 (05/19/2009)   LDL: 53 (12/30/2008)   TG: 224.0 (05/19/2009)  Problem # 4:  OSTEOPOROSIS (ICD-733.00)  Discussed medication use, applications of heat or ice, and exercises.   Problem # 5:  HYPERLIPIDEMIA (ICD-272.4) the current lipids and the a1c are at goals Her updated medication list for this problem includes:    Simvastatin 40 Mg Tabs (Simvastatin) .Marland Kitchen... 1 once daily  Labs Reviewed: SGOT: 18 (05/19/2009)   SGPT: 13 (05/19/2009)  Lipid Goals: Chol Goal: 200 (05/25/2009)  HDL Goal: 40 (05/25/2009)   LDL Goal: 100 (05/25/2009)   TG Goal: 150 (05/25/2009)  Prior 10 Yr Risk Heart Disease: 5 % (08/24/2009)   HDL:63.80 (05/19/2009), 49.70 (12/30/2008)  LDL:53 (12/30/2008), 58 (03/24/2008)  Chol:170 (05/19/2009), 133 (12/30/2008)  Trig:224.0 (05/19/2009), 151.0 (12/30/2008)  Problem # 6:  CELLULITIS AND ABSCESS OF NECK (ICD-682.1) resolved Her updated medication list for this problem includes:    Keflex 500 Mg Caps (Cephalexin) .Marland Kitchen... 1 cap by mouth 4 x daily x 7 days  Complete Medication List: 1)  Calcium 600/vitamin D 600-400 Mg-unit Tabs (Calcium carbonate-vitamin d) .... One ppo bid 2)  Diovan Hct 160-25 Mg Tabs (Valsartan-hydrochlorothiazide) .... One by mouth daily 3)  Simvastatin 40 Mg Tabs (Simvastatin) .Marland Kitchen.. 1 once daily 4)  Gabapentin 300 Mg Caps (Gabapentin) .Marland Kitchen.. 1 at bedtime 5)  Vitamin D 1000 Unit Caps  (Cholecalciferol) .... Once daily 6)  Prilosec 20 Mg Cpdr (Omeprazole) .... Once daily 7)  Ecotrin 325 Mg Tbec (Aspirin) .Marland Kitchen.. 1 once daily 8)  Multivitamins Caps (Multiple vitamin) .... Once daily 9)  Allergy Vaccine 1:10 Gh  .... From 1:50 at next order. 10)  Tylenol Arthritis Pain 650 Mg Cr-tabs (Acetaminophen) .Marland Kitchen.. 1-2 by mouth p rn pain 11)  Keflex 500 Mg Caps (Cephalexin) .Marland Kitchen.. 1 cap by mouth 4 x daily x 7 days 12)  Clotrimazole 1 % Crea (Clotrimazole) .... Apply to lesions two times a day until gone or 30days.  Hypertension Assessment/Plan:      The patient's hypertensive risk group is category C: Target organ damage and/or diabetes.  Her calculated 10 year risk of coronary heart disease is 5 %.  Today's blood pressure is 130/80.  Her blood pressure goal is < 140/90.  Patient Instructions: 1)  hold the zocar for 2 weeks and see if  muscle pain stops in the right arm pain stops... call me if the pain stops and we wil try liptor in a pulse 20 mg twice a week 2)  Please schedule a follow-up appointment in 3 months. Prescriptions: DIOVAN HCT 160-25 MG TABS (VALSARTAN-HYDROCHLOROTHIAZIDE) one by mouth daily  #90 x 3   Entered and Authorized by:   Stacie Glaze MD   Signed by:   Stacie Glaze MD on 12/01/2009   Method used:   Electronically to        [facility] [phone]* (retail)       [address] [place], [place]  [address]       Ph: [phone]       Fax: [phone]   RxID:   [id number]

## 2010-05-27 NOTE — Progress Notes (Signed)
----   Converted from flag ---- ---- 01/12/2010 4:50 PM, Danise Edge MD wrote:  Neysa Bonito  Can you check with patient and make sure she is willing to proceed with CT scan and then we can order it. Dr Lovell Sheehan agrees it would be helpful. Need a CT of chest with and without contrast to evaluate SOB and right sided nodule. Needs to have a renal panel done in past 30 days to get the CT done ---- 01/12/2010 3:26 PM, Stacie Glaze MD wrote: ct reasonable  ---- 01/12/2010 8:37 AM, Danise Edge MD wrote: good morning, curious if you would like me to order a CT on this gal? She came in SOB yesterday, treating a Bronchitis at present and have ordered PFTs due to previous h/o 2 ppd habit. RLL nodule is present stable from 12/10 but not seen 7/10 ------------------------------     New Problems: PULMONARY NODULE, RIGHT LOWER LOBE (ICD-518.89)   New Problems: PULMONARY NODULE, RIGHT LOWER LOBE (ICD-518.89)  Appended Document:  Pt wants CT scheduled.

## 2010-05-27 NOTE — Miscellaneous (Signed)
Summary: Injection Orders / [facility]    Injection Orders / [facility]    Imported By: Lennie Odor 09/22/2009 15:56:43  _____________________________________________________________________  External Attachment:    Type:   Image     Comment:   External Document

## 2010-05-27 NOTE — Progress Notes (Signed)
Summary: Coughed up aspirated pill  Phone Note Call from Patient Call back at Home Phone [phone]   Caller: Patient Call For: young Reason for Call: Talk to Nurse, Lab or Test Results Summary of Call: pt would like results of her cxr Initial call taken by: Eugene Gavia,  June 05, 2009 3:42 PM  Follow-up for Phone Call        Please advise cxr results, thanks! Vernie Murders  June 05, 2009 3:43 PM   Additional Follow-up for Phone Call Additional follow up Details #1::        Spoke with pt and made aware of results per append.  Pt states that late Friday night she actually coughed up the pill.  She feels much better now.  Still has some occ dry cough.  Just wanted to let you know. Additional Follow-up by: Vernie Murders,  June 08, 2009 8:56 AM    Additional Follow-up for Phone Call Additional follow up Details #2::    Noted. Follow-up by: Waymon Budge MD,  June 09, 2009 5:52 PM

## 2010-05-27 NOTE — Miscellaneous (Signed)
Summary: Orders Update pft charges  Clinical Lists Changes  Orders: Added new Service order of Carbon Monoxide diffusing w/capacity (94720) - Signed Added new Service order of Lung Volumes (94240) - Signed Added new Service order of Spirometry (Pre & Post) (94060) - Signed 

## 2010-05-27 NOTE — Assessment & Plan Note (Signed)
Summary: Pulmonary Consultation   Copy to:  Dr. Darryll Capers Primary Provider/Referring Provider:  Darryll Capers, MD  CC:  Pulmonary Consult.  Marland Kitchen  History of Present Illness:  Pulmonary Consultation       This is an 77 year old female who presents with an abnormal radiology finding.  The patient complains of shortness of breath, chest tightness, and cough, but denies history of diagnosed COPD, chest pain worse with breathing and coughing, wheezing, mucous production, nocturnal awakening, exercise induced symptoms, and congestion.  The abnormality is described as multiple nodules.  Nodules are less than 5 mm.  The mass is described as right middle lobe.    Pt notes abn CT 12/10  two nodules  and f/u ct 12/2009 still present.  Nodules are noncalcified and small <56mm    hx of allergies and sees young for same Pt reports dyspnea comes and goes No real cough No chest pain.  No associated other symptoms  Preventive Screening-Counseling & Management  Alcohol-Tobacco     Smoking Status: quit > 6 months     Year Quit: 1995     Pack years: 26  Current Medications (verified): 1)  Calcium-Vitamin D 500-125 Mg-Unit Tabs (Calcium-Vitamin D) .... Take 1 Tablet By Mouth Two Times A Day 2)  Diovan Hct 160-25 Mg Tabs (Valsartan-Hydrochlorothiazide) .... One By Mouth Daily 3)  Simvastatin 40 Mg Tabs (Simvastatin) .Marland Kitchen.. 1 Once Daily 4)  Carbatrol 100 Mg Xr12h-Cap (Carbamazepine) .... Take 1 Capsule By Mouth At Bedtime 5)  Vitamin D 1000 Unit  Caps (Cholecalciferol) .... Once Daily 6)  Prilosec 20 Mg  Cpdr (Omeprazole) .... Once Daily 7)  Ecotrin 325 Mg Tbec (Aspirin) .Marland Kitchen.. 1 Once Daily 8)  Multivitamins   Caps (Multiple Vitamin) .... Once Daily 9)  Allergy Vaccine 1:10 Gh .... From 1:50 At Next Order. 10)  Tylenol Arthritis Pain 650 Mg Cr-Tabs (Acetaminophen) .Marland Kitchen.. 1-2 By Mouth As Needed For  Pain 11)  Ventolin Hfa 108 (90 Base) Mcg/act Aers (Albuterol Sulfate) .Marland Kitchen.. 1-2 Puffs By Mouth Q 4-6 Hours As  Needed Cough/wheeze/sob  Allergies (verified): 1)  Sulfamethoxazole (Sulfamethoxazole) 2)  * Latex  Past History:  Past medical, surgical, family and social histories (including risk factors) reviewed, and no changes noted (except as noted below).  Past Medical History: Reviewed history from 09/11/2008 and no changes required. GERD Hyperlipidemia Hypertension popliteal stenosis Colonic polyps, hx of Diverticulosis, colon ( chronic diarrhea now subsided) Shingles Eczema (DR. Terri Piedra). Latex- contact rash Allergicr rhinoconjunctivitis- ST Pos 09/11/08  Past Surgical History: Reviewed history from 08/04/2008 and no changes required. s/p fem pop bypass with gortex grafts (1996) Tubal ligation Cholecystectomy (1998) Tonsillectomy  Family History: Reviewed history from 08/04/2008 and no changes required. Family History of CAD Female 1st degree relative <50 father Family History of Breast Cancer: Sister x 2 brother/sister- lung cancer Family History of Colon Cancer: Sister Family History of Colon Polyps: Father Children- allergies Arthritis- mother  Social History: Reviewed history from 08/04/2008 and no changes required. Retired from office work Former Smoker- 2 ppd x 30 yrs, quit approx 1995 Alcohol Use - no Illicit Drug Use - no\par alone, widowed , part-time Agricultural consultant work at [employer]  Smoking Status:  quit > 6 months Pack years:  60  Review of Systems       The patient complains of shortness of breath with activity, shortness of breath at rest, acid heartburn, indigestion, nasal congestion/difficulty breathing through nose, sneezing, anxiety, and hand/feet swelling.  The patient denies productive cough,  non-productive cough, coughing up blood, chest pain, irregular heartbeats, loss of appetite, weight change, abdominal pain, difficulty swallowing, sore throat, tooth/dental problems, headaches, itching, ear ache, depression, joint stiffness or pain, rash, change in color of  mucus, and fever.        See HPI for Pulmonary, Cardiac, General, and ENT review of systems.  Vital Signs:  Patient profile:   77 year old female Height:      62 inches Weight:      177.50 pounds BMI:     32.58 O2 Sat:      96 % on Room air Temp:     97.7 degrees F oral Pulse rate:   71 / minute BP sitting:   164 / 84  (right arm) Cuff size:   regular  Vitals Entered By: Gweneth Dimitri RN (February 03, 2010 3:58 PM)  O2 Flow:  Room air CC: Pulmonary Consult.   Comments Medications reviewed with patient Daytime contact number verified with patient. Gweneth Dimitri RN  February 03, 2010 3:58 PM    Physical Exam  Additional Exam:  General: A/Ox3; pleasant and cooperative, NAD, not tearing now SKIN:.No rash  NODES: no lymphadenopathy HEENT: [place]/AT, EOM- WNL, Conjuctivae- clear, PERRLA, TM-WNL, Nose- clear, some pallor on inferior trubinates, Throat- clear and wnl, dentures, [place] II NECK: Supple w/ fair ROM, JVD- none, normal carotid impulses w/o bruits  CHEST: Clear to P&A, HEART: RRR, no m/g/r heard ABDOMEN: soft EXT: no edema NEURO: Grossly intact to observation      CT of Chest  Procedure date:  01/15/2010  Findings:      IMPRESSION:   1.  Two pulmonary nodules in the right middle lobe corresponding to the areas of concern seen on chest x-ray.  The areas are large enough that PET CT may be of use.  Alternatively, percutaneous biopsy could also be performed.  No adenopathy or other areas of airspace disease. 2.  Atherosclerosis detailed above. 3.  Incidental findings as detailed above.  Impression & Recommendations:  Problem # 1:  PULMONARY NODULE, RIGHT LOWER LOBE (ICD-518.89) Assessment Unchanged RML lung nodules.  Non calcified. smoking hx noted plan PET scan.  COnsider ENB or open lung bx depending upon pet scan result Orders: New Patient Level V (91478) Radiology Referral (Radiology)  Medications Added to Medication List This Visit: 1)   Calcium-vitamin D 500-125 Mg-unit Tabs (Calcium-vitamin d) .... Take 1 tablet by mouth two times a day 2)  Carbatrol 100 Mg Xr12h-cap (Carbamazepine) .... Take 1 capsule by mouth at bedtime 3)  Tylenol Arthritis Pain 650 Mg Cr-tabs (Acetaminophen) .Marland Kitchen.. 1-2 by mouth as needed for  pain  Complete Medication List: 1)  Calcium-vitamin D 500-125 Mg-unit Tabs (Calcium-vitamin d) .... Take 1 tablet by mouth two times a day 2)  Diovan Hct 160-25 Mg Tabs (Valsartan-hydrochlorothiazide) .... One by mouth daily 3)  Simvastatin 40 Mg Tabs (Simvastatin) .Marland Kitchen.. 1 once daily 4)  Carbatrol 100 Mg Xr12h-cap (Carbamazepine) .... Take 1 capsule by mouth at bedtime 5)  Vitamin D 1000 Unit Caps (Cholecalciferol) .... Once daily 6)  Prilosec 20 Mg Cpdr (Omeprazole) .... Once daily 7)  Ecotrin 325 Mg Tbec (Aspirin) .Marland Kitchen.. 1 once daily 8)  Multivitamins Caps (Multiple vitamin) .... Once daily 9)  Allergy Vaccine 1:10 Gh  .... From 1:50 at next order. 10)  Tylenol Arthritis Pain 650 Mg Cr-tabs (Acetaminophen) .Marland Kitchen.. 1-2 by mouth as needed for  pain 11)  Ventolin Hfa 108 (90 Base) Mcg/act Aers (Albuterol sulfate) .Marland Kitchen.. 1-2 puffs  by mouth q 4-6 hours as needed cough/wheeze/sob  Patient Instructions: 1)  A PET Scan will be obtained 2)  I will call with results   Immunization History:  Influenza Immunization History:    Influenza:  historical (01/23/2010)

## 2010-05-27 NOTE — Progress Notes (Signed)
Summary: allergy shot  Phone Note Call from Patient Call back at Home Phone [phone]   Caller: Patient Call For: young Summary of Call: pt just received her allergy shot. wanted to leave a msg for dr young. pt states that her eyes have been tearing up. wants to know if the dosage for allergy serum should be increased.  Initial call taken by: Tivis Ringer, CNA,  September 25, 2009 12:56 PM  Follow-up for Phone Call        [facility] Vernie Murders  September 25, 2009 2:18 PM  Pt states she is still tearing up alot and wanted to know if her allergy vaccine can be increased. Please advise. Carron Curie CMA  September 25, 2009 3:14 PM   Additional Follow-up for Phone Call Additional follow up Details #1::        we will advance vaccine concentration to 1:10 with her next order.  Meanwhile, stay in airconditioning mid-day to avoid air pollution. OK to use antihistamine of choice. Additional Follow-up by: Waymon Budge MD,  September 30, 2009 9:13 PM    New/Updated Medications: * ALLERGY VACCINE 1:10 GH From 1:50 at next order.

## 2010-05-27 NOTE — Progress Notes (Signed)
Summary: add a test to labs   Phone Note Call from Patient Call back at Home Phone [phone]   Caller: Patient Call For: Stacie Glaze MD Reason for Call: Refill Medication Summary of Call: pt called and requested that a TSH be added to her labs in September. is this okay? Initial call taken by: Army Fossa CMA,  December 04, 2008 11:45 AM  Follow-up for Phone Call        yes that is fine per dr Esperanza Heir Follow-up by: Willy Eddy, LPN,  December 04, 2008 11:47 AM  Additional Follow-up for Phone Call Additional follow up Details #1::        Phone Call Completed Additional Follow-up by: Army Fossa CMA,  December 04, 2008 11:48 AM

## 2010-05-27 NOTE — Assessment & Plan Note (Signed)
Summary: SHORTWINDED & bp ELEV/J PT/SAW NEUROLOGIST WHO SAID SEE PCP/SP   Vital Signs:  Patient profile:   77 year old female Height:      62 inches (157.48 cm) Weight:      174 pounds (79.09 kg) O2 Sat:      94 % on Room air Temp:     98.2 degrees F (36.78 degrees C) oral Pulse rate:   78 / minute BP sitting:   140 / 74  (left arm) Cuff size:   regular  Vitals Entered By: Josph Macho RMA (January 11, 2010 3:40 PM)  O2 Flow:  Room air CC: Short winded and BP elevated x4 days/ Neurologist wanted pt to see PCP/ CF Is Patient Diabetic? No   History of Present Illness: patient is in today for evaluation of elevated blood pressure and shortness of breath. She was in to see her neurologist this morning and her blood pressure was noted to be elevated in the 170s over 70s so they asked her to come in here for evaluation. She reports they also thought they heard a slight wheeze as well. She does acknowledge that she has been struggling with worsening shortness of breath for some time multiple months. Over the last 5 days it's gotten acutely worse. She believes with the weather change from hot to cold she began to notice more shortness of breath or cough. She denies any fevers any chills any chest pain, palpitations, GI, GU complaints she has a long history of allergies with excessive tearing pruritus rhinitis but this is not notably worse at the present time. She denies any GI symptoms such as diarrhea, anorexia, nausea, vomiting, constipation. She denies any present GU symptoms but does note having some trouble with dysuria roughly 2 weeks ago without any notable hematuria fevers at that time. She does acknowledge a 2 pack per day smoking habit which she quit about 15 years ago. She did lose her husband to end-stage COPD 11 years ago. She finds herself anxious over the possibility of this diagnosis.  Current Medications (verified): 1)  Calcium 600/vitamin D 600-400 Mg-Unit  Tabs (Calcium  Carbonate-Vitamin D) .... One Ppo Bid 2)  Diovan Hct 160-25 Mg Tabs (Valsartan-Hydrochlorothiazide) .... One By Mouth Daily 3)  Simvastatin 40 Mg Tabs (Simvastatin) .Marland Kitchen.. 1 Once Daily 4)  Gabapentin 300 Mg  Caps (Gabapentin) .Marland Kitchen.. 1 At Bedtime 5)  Vitamin D 1000 Unit  Caps (Cholecalciferol) .... Once Daily 6)  Prilosec 20 Mg  Cpdr (Omeprazole) .... Once Daily 7)  Ecotrin 325 Mg Tbec (Aspirin) .Marland Kitchen.. 1 Once Daily 8)  Multivitamins   Caps (Multiple Vitamin) .... Once Daily 9)  Allergy Vaccine 1:10 Gh .... From 1:50 At Next Order. 10)  Tylenol Arthritis Pain 650 Mg Cr-Tabs (Acetaminophen) .Marland Kitchen.. 1-2 By Mouth P Rn Pain 11)  Clotrimazole 1 % Crea (Clotrimazole) .... Apply To Lesions Two Times A Day Until Gone or 30days.  Allergies (verified): 1)  Sulfamethoxazole (Sulfamethoxazole)  Past History:  Past medical history reviewed for relevance to current acute and chronic problems. Social history (including risk factors) reviewed for relevance to current acute and chronic problems.  Past Medical History: Reviewed history from 09/11/2008 and no changes required. GERD Hyperlipidemia Hypertension popliteal stenosis Colonic polyps, hx of Diverticulosis, colon ( chronic diarrhea now subsided) Shingles Eczema (DR. Terri Piedra). Latex- contact rash Allergicr rhinoconjunctivitis- ST Pos 09/11/08  Social History: Reviewed history from 08/04/2008 and no changes required. Retired Former Smoker- 2 ppd x 30 yrs, quit approx 1995 Alcohol Use -  no Illicit Drug Use - no\par alone, widowed , part-time Agricultural consultant work  Review of Systems      See HPI  Physical Exam  General:  Well-developed,well-nourished,in no acute distress; alert,appropriate and cooperative throughout examination Head:  Normocephalic and atraumatic without obvious abnormalities. No apparent alopecia or balding. Ears:  External ear exam shows no significant lesions or deformities.  Otoscopic examination reveals clear canals, tympanic  membranes are intact bilaterally without bulging, retraction, inflammation or discharge. Hearing is grossly normal bilaterally. Nose:  External nasal examination shows no deformity or inflammation. Nasal mucosa are pink and moist without lesions or exudates. Mouth:  Oral mucosa and oropharynx without lesions or exudates.  Teeth in good repair. Neck:  No deformities, masses, or tenderness noted. Lungs:  no intercostal retractions, no accessory muscle use, no dullness, no crackles, and no wheezes.  Prolonged expiratory phase is noted Heart:  Normal rate and regular rhythm. S1 and S2 normal without gallop, murmur, click, rub or other extra sounds. Abdomen:  Bowel sounds positive,abdomen soft and non-tender without masses, organomegaly or hernias noted. Msk:  No deformity or scoliosis noted of thoracic or lumbar spine.   Extremities:  No clubbing, cyanosis, edema, or deformity noted  Cervical Nodes:  No lymphadenopathy noted Psych:  Cognition and judgment appear intact. Alert and cooperative with normal attention span and concentration. No apparent delusions, illusions, hallucinations. slightly anxious.     Impression & Recommendations:  Problem # 1:  SHORTNESS OF BREATH (ICD-786.05)  Orders: T-2 View CXR (71020TC) Spirometry (Pre & Post) (94060) Misc. Referral (Misc. Ref) Acute bronchitis with likely underlying COPD due to history of 2 ppd habit and prolonged expiration. Given sample of Albuterol to use as directed, started on Qvar 1 puff by mouth two times a day and started on Ciprofloxacin  Problem # 2:  DYSURIA, HX OF (ICD-V13.09)  Orders: UA Dipstick w/o Micro (automated)  (81003) Started on Ciprofloxacin  Problem # 3:  HYPERTENSION (ICD-401.9)  Her updated medication list for this problem includes:    Diovan Hct 160-25 Mg Tabs (Valsartan-hydrochlorothiazide) ..... One by mouth daily Mild elevation with acute illness, has appt with PMD in next month, encouraged her to keep  that appt and call if symptoms worsen  Complete Medication List: 1)  Calcium 600/vitamin D 600-400 Mg-unit Tabs (Calcium carbonate-vitamin d) .... One ppo bid 2)  Diovan Hct 160-25 Mg Tabs (Valsartan-hydrochlorothiazide) .... One by mouth daily 3)  Simvastatin 40 Mg Tabs (Simvastatin) .Marland Kitchen.. 1 once daily 4)  Gabapentin 300 Mg Caps (Gabapentin) .Marland Kitchen.. 1 at bedtime 5)  Vitamin D 1000 Unit Caps (Cholecalciferol) .... Once daily 6)  Prilosec 20 Mg Cpdr (Omeprazole) .... Once daily 7)  Ecotrin 325 Mg Tbec (Aspirin) .Marland Kitchen.. 1 once daily 8)  Multivitamins Caps (Multiple vitamin) .... Once daily 9)  Allergy Vaccine 1:10 Gh  .... From 1:50 at next order. 10)  Tylenol Arthritis Pain 650 Mg Cr-tabs (Acetaminophen) .Marland Kitchen.. 1-2 by mouth p rn pain 11)  Clotrimazole 1 % Crea (Clotrimazole) .... Apply to lesions two times a day until gone or 30days. 12)  Ventolin Hfa 108 (90 Base) Mcg/act Aers (Albuterol sulfate) .Marland Kitchen.. 1-2 puffs by mouth q 4-6 hours as needed cough/wheeze/sob 13)  Qvar 80 Mcg/act Aers (Beclomethasone dipropionate) .Marland Kitchen.. 1 puff by mouth two times a day 14)  Cipro 250 Mg Tabs (Ciprofloxacin hcl) .Marland Kitchen.. 1 tab by mouth two times a day x 10 days  Patient Instructions: 1)  Please schedule an appointment with your primary doctor as scheduled.  2)  Take your antibiotic as prescribed until ALL of it is gone, but stop if you develop a rash or swelling and contact our office as soon as possible.  3)  Acute Bronchitis symptoms for less then 10 days are not  helped by antibiotics. Take over the counter cough medications. Call if no improvement in 5-7 days, sooner if increasing cough, fever, or new symptoms ( shortness of breath, chest pain) .  4)  While taking an antibiotic recommend taking a hi dose yogurt such as Activia or a probiotic such as Librarian, academic daily Prescriptions: CIPRO 250 MG TABS (CIPROFLOXACIN HCL) 1 tab by mouth two times a day x 10 days  #20 x 0   Entered and Authorized by:   Danise Edge MD   Signed  by:   Danise Edge MD on 01/11/2010   Method used:   Electronically to        [facility] [id number] (retail)       [address] [place], [place]  [address]       Ph: [phone]       Fax: [phone]   RxID:   [id number] QVAR 80 MCG/ACT AERS (BECLOMETHASONE DIPROPIONATE) 1 puff by mouth two times a day  #1 x 0   Entered and Authorized by:   Danise Edge MD   Signed by:   Danise Edge MD on 01/11/2010   Method used:   Samples Given   RxID:   [id number] VENTOLIN HFA 108 (90 BASE) MCG/ACT AERS (ALBUTEROL SULFATE) 1-2 puffs by mouth q 4-6 hours as needed cough/wheeze/SOB  #1 x 0   Entered and Authorized by:   Danise Edge MD   Signed by:   Danise Edge MD on 01/11/2010   Method used:   Samples Given   RxID:   [id number]   Appended Document: SHORTWINDED & bp ELEV/J PT/SAW NEUROLOGIST WHO SAID SEE PCP/SP  Laboratory Results   Urine Tests    Routine Urinalysis   Color: yellow Appearance: Clear Glucose: negative   (Normal Range: Negative) Bilirubin: negative   (Normal Range: Negative) Ketone: negative   (Normal Range: Negative) Spec. Gravity: 1.025   (Normal Range: 1.003-1.035) Blood: negative   (Normal Range: Negative) pH: 5.5   (Normal Range: 5.0-8.0) Protein: negative   (Normal Range: Negative) Urobilinogen: 0.2   (Normal Range: 0-1) Nitrite: negative   (Normal Range: Negative) Leukocyte Esterace: 1+   (Normal Range: Negative)    Comments: Rita Ohara  January 11, 2010 4:52 PM

## 2010-05-27 NOTE — Progress Notes (Signed)
Summary: Thinks she aspirated a pill  Phone Note Call from Patient   Summary of Call: Patient in today for allergy shot, says she thinks she aspirated a pill this morning and feels tighter in chest. No capsules, but she doesn't knw what she took. Initial call taken by: Waymon Budge MD,  June 05, 2009 12:48 PM  New Problems: FOREIGN BODY IN MAIN BRONCHUS (ICD-934.1)   New Problems: FOREIGN BODY IN MAIN BRONCHUS (ICD-934.1)

## 2010-05-27 NOTE — Letter (Signed)
Summary: [facility]   [facility]   Imported By: Maryln Gottron 05/08/2009 13:19:04  _____________________________________________________________________  External Attachment:    Type:   Image     Comment:   External Document

## 2010-05-27 NOTE — Assessment & Plan Note (Signed)
Summary: 3 mo ROV/cb   Vital Signs:  Patient profile:   77 year old female Height:      62 inches Weight:      176 pounds BMI:     32.31 Temp:     98.2 degrees F oral Pulse rate:   76 / minute Resp:     14 per minute BP sitting:   140 / 80  (left arm)  Nutrition Counseling: Patient's BMI is greater than 25 and therefore counseled on weight management options. CC: roa Is Patient Diabetic? No   Primary Care Provider:  Darryll Capers, MD  CC:  roa.  History of Present Illness: the pt is still concerned about the initial call about the nodules she has had consults from pulmonary about her nodules she has concerns about shingles. She has mild to moderate COPD ( see PFT's) Dr Delford Field "was great" the pet scan was negative and the pulmonary nodules appear to be benign she is comfotable with the monitering plan  Hypertension History:      She denies headache, chest pain, palpitations, dyspnea with exertion, orthopnea, PND, peripheral edema, visual symptoms, neurologic problems, syncope, and side effects from treatment.        Positive major cardiovascular risk factors include female age 80 years old or older, hyperlipidemia, and hypertension.  Negative major cardiovascular risk factors include non-tobacco-user status.        Positive history for target organ damage include peripheral vascular disease.  Further assessment for target organ damage reveals no history of stroke/TIA.     Preventive Screening-Counseling & Management  Alcohol-Tobacco     Smoking Status: quit > 6 months     Year Quit: 1995     Pack years: 64     Tobacco Counseling: to remain off tobacco products  Problems Prior to Update: 1)  Chronic Obstructive Pulmonary Disease  (ICD-496) 2)  Pulmonary Nodule, Right Lower Lobe  (ICD-518.89) 3)  Tobacco Abuse, Hx of  (ICD-V15.82) 4)  Shortness of Breath  (ICD-786.05) 5)  Cellulitis and Abscess of Neck  (ICD-682.1) 6)  Osteoarthritis, Moderate  (ICD-715.90) 7)   Impaired Glucose Tolerance  (ICD-271.3) 8)  Osteoporosis  (ICD-733.00) 9)  Hip Pain, Left  (ICD-719.45) 10)  Conjunctivitis, Allergic  (ICD-372.14) 11)  Allergic Rhinitis Cause Unspecified  (ICD-477.9) 12)  Epiphora  (ICD-375.20) 13)  Eczema  (ICD-692.9) 14)  Fh of Colon Cancer  (ICD-153.9) 15)  Hx of Postherpetic Neuralgia  (ICD-053.19) 16)  Hiatal Hernia, Hx of  (ICD-V12.79) 17)  Esophageal Stricture  (ICD-530.3) 18)  Duodenitis  (ICD-535.60) 19)  Colonic Polyps, Recurrent  (ICD-211.3) 20)  Colitis  (ICD-558.9) 21)  Peripheral Vascular Disease  (ICD-443.9) 22)  Diarrhea, Chronic  (ICD-787.91) 23)  Anxiety State Nec  (ICD-300.09) 24)  Family History of Cad Female 1st Degree Relative <50  (ICD-V17.3) 25)  Diverticulosis, Colon  (ICD-562.10) 26)  Colonic Polyps, Hx of  (ICD-V12.72) 27)  Hypertension  (ICD-401.9) 28)  Hyperlipidemia  (ICD-272.4)  Current Problems (verified): 1)  Chronic Obstructive Pulmonary Disease  (ICD-496) 2)  Pulmonary Nodule, Right Lower Lobe  (ICD-518.89) 3)  Dysuria, Hx of  (ICD-V13.09) 4)  Tobacco Abuse, Hx of  (ICD-V15.82) 5)  Shortness of Breath  (ICD-786.05) 6)  Arm Pain, Right  (ICD-729.5) 7)  Cellulitis and Abscess of Neck  (ICD-682.1) 8)  Osteoarthritis, Moderate  (ICD-715.90) 9)  Impaired Glucose Tolerance  (ICD-271.3) 10)  Osteoporosis  (ICD-733.00) 11)  Hip Pain, Left  (ICD-719.45) 12)  Conjunctivitis, Allergic  (ICD-372.14) 13)  Allergic Rhinitis Cause Unspecified  (ICD-477.9) 14)  Epiphora  (ICD-375.20) 15)  Eczema  (ICD-692.9) 16)  Fh of Colon Cancer  (ICD-153.9) 17)  Postherpetic Neuralgia  (ICD-053.19) 18)  Hiatal Hernia, Hx of  (ICD-V12.79) 19)  Esophageal Stricture  (ICD-530.3) 20)  Duodenitis  (ICD-535.60) 21)  Colonic Polyps, Recurrent  (ICD-211.3) 22)  Colitis  (ICD-558.9) 23)  Shingles, Hx of  (ICD-V13.8) 24)  Peripheral Vascular Disease  (ICD-443.9) 25)  Diarrhea, Chronic  (ICD-787.91) 26)  Anxiety State Nec   (ICD-300.09) 27)  Diverticulitis, Colon W/hem  (ICD-562.13) 28)  Family History of Cad Female 1st Degree Relative <50  (ICD-V17.3) 29)  Diverticulosis, Colon  (ICD-562.10) 30)  Colonic Polyps, Hx of  (ICD-V12.72) 31)  Hypertension  (ICD-401.9) 32)  Hyperlipidemia  (ICD-272.4)  Medications Prior to Update: 1)  Calcium-Vitamin D 500-125 Mg-Unit Tabs (Calcium-Vitamin D) .... Take 1 Tablet By Mouth Two Times A Day 2)  Diovan Hct 160-25 Mg Tabs (Valsartan-Hydrochlorothiazide) .... One By Mouth Daily 3)  Simvastatin 40 Mg Tabs (Simvastatin) .Marland Kitchen.. 1 Once Daily 4)  Carbatrol 100 Mg Xr12h-Cap (Carbamazepine) .... Take 1 Capsule By Mouth At Bedtime 5)  Vitamin D 1000 Unit  Caps (Cholecalciferol) .... Once Daily 6)  Prilosec 20 Mg  Cpdr (Omeprazole) .... Once Daily 7)  Ecotrin 325 Mg Tbec (Aspirin) .Marland Kitchen.. 1 Once Daily 8)  Multivitamins   Caps (Multiple Vitamin) .... Once Daily 9)  Allergy Vaccine 1:10 Gh .... From 1:50 At Next Order. 10)  Tylenol Arthritis Pain 650 Mg Cr-Tabs (Acetaminophen) .Marland Kitchen.. 1-2 By Mouth As Needed For  Pain 11)  Ventolin Hfa 108 (90 Base) Mcg/act Aers (Albuterol Sulfate) .Marland Kitchen.. 1-2 Puffs By Mouth Q 4-6 Hours As Needed Cough/wheeze/sob  Current Medications (verified): 1)  Calcium-Vitamin D 500-125 Mg-Unit Tabs (Calcium-Vitamin D) .... Take 1 Tablet By Mouth Two Times A Day 2)  Diovan Hct 160-25 Mg Tabs (Valsartan-Hydrochlorothiazide) .... One By Mouth Daily 3)  Simvastatin 40 Mg Tabs (Simvastatin) .Marland Kitchen.. 1 Once Daily 4)  Carbatrol 100 Mg Xr12h-Cap (Carbamazepine) .... Take 1 Capsule By Mouth At Bedtime 5)  Vitamin D 1000 Unit  Caps (Cholecalciferol) .... Once Daily 6)  Prilosec 20 Mg  Cpdr (Omeprazole) .... Once Daily 7)  Ecotrin 325 Mg Tbec (Aspirin) .Marland Kitchen.. 1 Once Daily 8)  Multivitamins   Caps (Multiple Vitamin) .... Once Daily 9)  Allergy Vaccine 1:10 Gh .... From 1:50 At Next Order. 10)  Tylenol Arthritis Pain 650 Mg Cr-Tabs (Acetaminophen) .Marland Kitchen.. 1-2 By Mouth As Needed For  Pain 11)   Ventolin Hfa 108 (90 Base) Mcg/act Aers (Albuterol Sulfate) .Marland Kitchen.. 1-2 Puffs By Mouth Q 4-6 Hours As Needed Cough/wheeze/sob 12)  Mupirocin 2 % Oint (Mupirocin) .... Aplly To Base of Neck Daily For One Week Then As Needed  Allergies (verified): 1)  Sulfamethoxazole (Sulfamethoxazole) 2)  * Latex  Past History:  Family History: Last updated: 08/04/2008 Family History of CAD Female 1st degree relative <50 father Family History of Breast Cancer: Sister x 2 brother/sister- lung cancer Family History of Colon Cancer: Sister Family History of Colon Polyps: Father Children- allergies Arthritis- mother  Social History: Last updated: 02/03/2010 Retired from office work Former Smoker- 2 ppd x 30 yrs, quit approx 1995 Alcohol Use - no Illicit Drug Use - no\par alone, widowed , part-time Agricultural consultant work at [employer]   Risk Factors: Smoking Status: quit > 6 months (03/02/2010)  Past medical, surgical, family and social histories (including risk factors) reviewed, and no changes noted (except as noted below).  Past  Medical History: Reviewed history from 09/11/2008 and no changes required. GERD Hyperlipidemia Hypertension popliteal stenosis Colonic polyps, hx of Diverticulosis, colon ( chronic diarrhea now subsided) Shingles Eczema (DR. Terri Piedra). Latex- contact rash Allergicr rhinoconjunctivitis- ST Pos 09/11/08  Past Surgical History: Reviewed history from 08/04/2008 and no changes required. s/p fem pop bypass with gortex grafts (1996) Tubal ligation Cholecystectomy (1998) Tonsillectomy  Family History: Reviewed history from 08/04/2008 and no changes required. Family History of CAD Female 1st degree relative <50 father Family History of Breast Cancer: Sister x 2 brother/sister- lung cancer Family History of Colon Cancer: Sister Family History of Colon Polyps: Father Children- allergies Arthritis- mother  Social History: Reviewed history from 02/03/2010 and no changes  required. Retired from office work Former Smoker- 2 ppd x 30 yrs, quit approx 1995 Alcohol Use - no Illicit Drug Use - no\par alone, widowed , part-time Agricultural consultant work at [employer]   Review of Systems  The patient denies anorexia, fever, weight loss, weight gain, vision loss, decreased hearing, hoarseness, chest pain, syncope, dyspnea on exertion, peripheral edema, prolonged cough, headaches, hemoptysis, abdominal pain, melena, hematochezia, severe indigestion/heartburn, hematuria, incontinence, genital sores, muscle weakness, suspicious skin lesions, transient blindness, difficulty walking, depression, unusual weight change, abnormal bleeding, enlarged lymph nodes, angioedema, and breast masses.    Physical Exam  General:  Well-developed,well-nourished,in no acute distress; alert,appropriate and cooperative throughout examination Head:  Normocephalic and atraumatic without obvious abnormalities. No apparent alopecia or balding. Eyes:  conjunctival injection and excessive tearing.   Ears:  R ear normal and L ear normal.   Nose:  no external deformity and no nasal discharge.   Mouth:  good dentition and pharynx pink and moist.   Neck:  No deformities, masses, or tenderness noted. Lungs:  no intercostal retractions, no accessory muscle use, no dullness, no crackles, and no wheezes.  Prolonged expiratory phase is noted Heart:  Normal rate and regular rhythm. S1 and S2 normal without gallop, murmur, click, rub or other extra sounds.   Impression & Recommendations:  Problem # 1:  CHRONIC OBSTRUCTIVE PULMONARY DISEASE (ICD-496)  Her updated medication list for this problem includes:    Ventolin Hfa 108 (90 Base) Mcg/act Aers (Albuterol sulfate) .Marland Kitchen... 1-2 puffs by mouth q 4-6 hours as needed cough/wheeze/sob  Pulmonary Functions Reviewed: O2 sat: 96 (02/03/2010)     Vaccines Reviewed: Pneumovax: Historical (04/25/2005)   Flu Vax: Historical (01/23/2010)  Problem # 2:  PULMONARY NODULE, RIGHT  LOWER LOBE (ICD-518.89) repeat CT  to moniter  size  Problem # 3:  ALLERGIC RHINITIS CAUSE UNSPECIFIED (ICD-477.9) sees Dr Maple Hudson Discussed use of allergy medications and environmental measures.   Problem # 4:  SHINGLES, HX OF (ICD-V13.8) discussion   Problem # 5:  HYPERTENSION (ICD-401.9)  Her updated medication list for this problem includes:    Diovan Hct 160-25 Mg Tabs (Valsartan-hydrochlorothiazide) ..... One by mouth daily  BP today: 140/80 Prior BP: 164/84 (02/03/2010)  10 Yr Risk Heart Disease: 9 % Prior 10 Yr Risk Heart Disease: 5 % (08/24/2009)  Labs Reviewed: K+: 5.1 (08/18/2009) Creat: : 0.9 (08/18/2009)   Chol: 165 (11/24/2009)   HDL: 55.30 (11/24/2009)   LDL: 77 (11/24/2009)   TG: 166.0 (11/24/2009)  Complete Medication List: 1)  Calcium-vitamin D 500-125 Mg-unit Tabs (Calcium-vitamin d) .... Take 1 tablet by mouth two times a day 2)  Diovan Hct 160-25 Mg Tabs (Valsartan-hydrochlorothiazide) .... One by mouth daily 3)  Simvastatin 40 Mg Tabs (Simvastatin) .Marland Kitchen.. 1 once daily 4)  Carbatrol 100 Mg Xr12h-cap (  Carbamazepine) .... Take 1 capsule by mouth at bedtime 5)  Vitamin D 1000 Unit Caps (Cholecalciferol) .... Once daily 6)  Prilosec 20 Mg Cpdr (Omeprazole) .... Once daily 7)  Ecotrin 325 Mg Tbec (Aspirin) .Marland Kitchen.. 1 once daily 8)  Multivitamins Caps (Multiple vitamin) .... Once daily 9)  Allergy Vaccine 1:10 Gh  .... From 1:50 at next order. 10)  Tylenol Arthritis Pain 650 Mg Cr-tabs (Acetaminophen) .Marland Kitchen.. 1-2 by mouth as needed for  pain 11)  Ventolin Hfa 108 (90 Base) Mcg/act Aers (Albuterol sulfate) .Marland Kitchen.. 1-2 puffs by mouth q 4-6 hours as needed cough/wheeze/sob 12)  Mupirocin 2 % Oint (Mupirocin) .... Aplly to base of neck daily for one week then as needed  Hypertension Assessment/Plan:      The patient's hypertensive risk group is category C: Target organ damage and/or diabetes.  Her calculated 10 year risk of coronary heart disease is 9 %.  Today's blood pressure  is 140/80.  Her blood pressure goal is < 140/90.  Patient Instructions: 1)  Feb  follow up exam for CT and nodules 2)  collect 29 for shot Prescriptions: MUPIROCIN 2 % OINT (MUPIROCIN) aplly to base of neck daily for one week then as needed  #15 gms x 0   Entered and Authorized by:   Stacie Glaze MD   Signed by:   Stacie Glaze MD on 03/02/2010   Method used:   Electronically to        [facility] [phone]* (retail)       [address] [place], [place]  [address]       Ph: [phone]       Fax: [phone]   RxID:   [id number]    Orders Added: 1)  Est. Patient Level IV [32440]   Immunizations Administered:  Zostavax # 1:    Vaccine Type: Zostavax    Site: right deltoid    Mfr: Merck    Dose: 0.5 ml    Route: [place]    Given by: Willy Eddy, LPN    Exp. Date: 12/11/2010    Lot #: [id number]    VIS given: 02/04/05 given March 02, 2010.   Immunizations Administered:  Zostavax # 1:    Vaccine Type: Zostavax    Site: right deltoid    Mfr: Merck    Dose: 0.5 ml    Route: [place]    Given by: Willy Eddy, LPN    Exp. Date: 12/11/2010    Lot #: [id number]    VIS given: 02/04/05 given March 02, 2010.

## 2010-05-27 NOTE — Assessment & Plan Note (Signed)
Summary: 3 month rov/njr/pt rsc/cjr/pt rsc/cjr   Vital Signs:  Patient profile:   77 year old female Height:      62 inches Weight:      164 pounds BMI:     30.10 Temp:     98.2 degrees F oral Pulse rate:   76 / minute Resp:     14 per minute BP sitting:   135 / 70  (left arm)  Vitals Entered By: Willy Eddy, LPN (May 25, 2009 10:33 AM) CC: roa labs, Hypertension Management, Lipid Management   Primary Care Provider:  Darryll Capers, MD  CC:  roa labs, Hypertension Management, and Lipid Management.  History of Present Illness: weight loss and DM folow up with liver and Hgb A1C   Hypertension History:      She denies headache, chest pain, palpitations, dyspnea with exertion, orthopnea, PND, peripheral edema, visual symptoms, neurologic problems, syncope, and side effects from treatment.        Positive major cardiovascular risk factors include female age 53 years old or older, hyperlipidemia, and hypertension.  Negative major cardiovascular risk factors include non-tobacco-user status.        Positive history for target organ damage include peripheral vascular disease.  Further assessment for target organ damage reveals no history of stroke/TIA.    Lipid Management History:      Positive NCEP/ATP III risk factors include female age 93 years old or older, hypertension, and peripheral vascular disease.  Negative NCEP/ATP III risk factors include non-tobacco-user status, no prior stroke/TIA, and no history of aortic aneurysm.      Preventive Screening-Counseling & Management  Alcohol-Tobacco     Smoking Status: quit  Problems Prior to Update: 1)  Impaired Glucose Tolerance  (ICD-271.3) 2)  Osteoporosis  (ICD-733.00) 3)  Hip Pain, Left  (ICD-719.45) 4)  Conjunctivitis, Allergic  (ICD-372.14) 5)  Allergic Rhinitis Cause Unspecified  (ICD-477.9) 6)  Epiphora  (ICD-375.20) 7)  Eczema  (ICD-692.9) 8)  Fh of Colon Cancer  (ICD-153.9) 9)  Postherpetic Neuralgia   (ICD-053.19) 10)  Hiatal Hernia, Hx of  (ICD-V12.79) 11)  Esophageal Stricture  (ICD-530.3) 12)  Duodenitis  (ICD-535.60) 13)  Colonic Polyps, Recurrent  (ICD-211.3) 14)  Colitis  (ICD-558.9) 15)  Shingles, Hx of  (ICD-V13.8) 16)  Peripheral Vascular Disease  (ICD-443.9) 17)  Trigeminal Neuralgia  (ICD-350.1) 18)  Diarrhea, Chronic  (ICD-787.91) 19)  Anxiety State Nec  (ICD-300.09) 20)  Diverticulitis, Colon W/hem  (ICD-562.13) 21)  Family History of Cad Female 1st Degree Relative <50  (ICD-V17.3) 22)  Diverticulosis, Colon  (ICD-562.10) 23)  Colonic Polyps, Hx of  (ICD-V12.72) 24)  Hypertension  (ICD-401.9) 25)  Hyperlipidemia  (ICD-272.4) 26)  Gerd  (ICD-530.81)  Current Problems (verified): 1)  Impaired Glucose Tolerance  (ICD-271.3) 2)  Osteoporosis  (ICD-733.00) 3)  Hip Pain, Left  (ICD-719.45) 4)  Conjunctivitis, Allergic  (ICD-372.14) 5)  Allergic Rhinitis Cause Unspecified  (ICD-477.9) 6)  Epiphora  (ICD-375.20) 7)  Eczema  (ICD-692.9) 8)  Fh of Colon Cancer  (ICD-153.9) 9)  Postherpetic Neuralgia  (ICD-053.19) 10)  Hiatal Hernia, Hx of  (ICD-V12.79) 11)  Esophageal Stricture  (ICD-530.3) 12)  Duodenitis  (ICD-535.60) 13)  Colonic Polyps, Recurrent  (ICD-211.3) 14)  Colitis  (ICD-558.9) 15)  Shingles, Hx of  (ICD-V13.8) 16)  Peripheral Vascular Disease  (ICD-443.9) 17)  Trigeminal Neuralgia  (ICD-350.1) 18)  Diarrhea, Chronic  (ICD-787.91) 19)  Anxiety State Nec  (ICD-300.09) 20)  Diverticulitis, Colon W/hem  (ICD-562.13) 21)  Family History  of Cad Female 1st Degree Relative <50  (ICD-V17.3) 22)  Diverticulosis, Colon  (ICD-562.10) 23)  Colonic Polyps, Hx of  (ICD-V12.72) 24)  Hypertension  (ICD-401.9) 25)  Hyperlipidemia  (ICD-272.4) 26)  Gerd  (ICD-530.81)  Medications Prior to Update: 1)  Calcium 600/vitamin D 600-400 Mg-Unit  Tabs (Calcium Carbonate-Vitamin D) .... One Ppo Bid 2)  Diovan Hct 160-12.5 Mg  Tabs (Valsartan-Hydrochlorothiazide) .... Once Daily 3)   Vytorin 10-20 Mg  Tabs (Ezetimibe-Simvastatin) .... Once Daily 4)  Gabapentin 300 Mg  Caps (Gabapentin) .Marland Kitchen.. 1 At Bedtime 5)  Vitamin D 1000 Unit  Caps (Cholecalciferol) .... Once Daily 6)  Prilosec 20 Mg  Cpdr (Omeprazole) .... Once Daily 7)  Bayer Aspirin Ec Low Dose 81 Mg Tbec (Aspirin) .... Take 1 By Mouth Once Daily 8)  Multivitamins   Caps (Multiple Vitamin) .... Once Daily 9)  Levsin/sl 0.125 Mg  Subl (Hyoscyamine Sulfate) .... One Sl As Needed  Forcramping  in Abd 10)  Allegra 180 Mg Tabs (Fexofenadine Hcl) .... Take 1 By Mouth Once Daily 11)  Ipratropium Bromide 0.06 % Soln (Ipratropium Bromide) .... One Spay in Each Nostril Before Bedtime 12)  Plavix 75 Mg Tabs (Clopidogrel Bisulfate) .Marland Kitchen.. 1 Once Daily 13)  Allergy Vaccine 1:50 Gh .... 2 Times A Week Build-Up  Current Medications (verified): 1)  Calcium 600/vitamin D 600-400 Mg-Unit  Tabs (Calcium Carbonate-Vitamin D) .... One Ppo Bid 2)  Diovan Hct 160-12.5 Mg  Tabs (Valsartan-Hydrochlorothiazide) .... Once Daily 3)  Vytorin 10-20 Mg  Tabs (Ezetimibe-Simvastatin) .... Once Daily 4)  Gabapentin 300 Mg  Caps (Gabapentin) .Marland Kitchen.. 1 At Bedtime 5)  Vitamin D 1000 Unit  Caps (Cholecalciferol) .... Once Daily 6)  Prilosec 20 Mg  Cpdr (Omeprazole) .... Once Daily 7)  Ecotrin 325 Mg Tbec (Aspirin) .Marland Kitchen.. 1 Once Daily 8)  Multivitamins   Caps (Multiple Vitamin) .... Once Daily 9)  Allergy Vaccine 1:50 Gh .... 2 Times A Week Build-Up  Allergies (verified): 1)  Sulfamethoxazole (Sulfamethoxazole)  Past History:  Family History: Last updated: 08/04/2008 Family History of CAD Female 1st degree relative <50 father Family History of Breast Cancer: Sister x 2 brother/sister- lung cancer Family History of Colon Cancer: Sister Family History of Colon Polyps: Father Children- allergies Arthritis- mother  Social History: Last updated: 08/04/2008 Retired Former Smoker- 2 ppd x 30 yrs, quit approx 1995 Alcohol Use - no Illicit Drug Use -  no\par alone, widowed , part-time Agricultural consultant work  Risk Factors: Smoking Status: quit (05/25/2009)  Past medical, surgical, family and social histories (including risk factors) reviewed, and no changes noted (except as noted below).  Past Medical History: Reviewed history from 09/11/2008 and no changes required. GERD Hyperlipidemia Hypertension popliteal stenosis Colonic polyps, hx of Diverticulosis, colon ( chronic diarrhea now subsided) Shingles Eczema (DR. Terri Piedra). Latex- contact rash Allergicr rhinoconjunctivitis- ST Pos 09/11/08  Past Surgical History: Reviewed history from 08/04/2008 and no changes required. s/p fem pop bypass with gortex grafts (1996) Tubal ligation Cholecystectomy (1998) Tonsillectomy  Family History: Reviewed history from 08/04/2008 and no changes required. Family History of CAD Female 1st degree relative <50 father Family History of Breast Cancer: Sister x 2 brother/sister- lung cancer Family History of Colon Cancer: Sister Family History of Colon Polyps: Father Children- allergies Arthritis- mother  Social History: Reviewed history from 08/04/2008 and no changes required. Retired Former Smoker- 2 ppd x 30 yrs, quit approx 1995 Alcohol Use - no Illicit Drug Use - no\par alone, widowed , part-time Agricultural consultant work  Review of  Systems  The patient denies anorexia, fever, weight loss, weight gain, vision loss, decreased hearing, hoarseness, chest pain, syncope, dyspnea on exertion, peripheral edema, prolonged cough, headaches, hemoptysis, abdominal pain, melena, hematochezia, severe indigestion/heartburn, hematuria, incontinence, genital sores, muscle weakness, suspicious skin lesions, transient blindness, difficulty walking, depression, unusual weight change, abnormal bleeding, enlarged lymph nodes, angioedema, and breast masses.    Physical Exam  General:  overweight.well-developed.   Head:  Normocephalic and atraumatic without obvious  abnormalities. No apparent alopecia or balding. Eyes:  conjunctival injection and excessive tearing.   Ears:  R ear normal and L ear normal.   Mouth:  posterior lymphoid hypertrophy and postnasal drip.   Neck:  No deformities, masses, or tenderness noted. Lungs:  normal respiratory effort and no wheezes.   Heart:  normal rate and regular rhythm.   Abdomen:  soft, non-tender, and normal bowel sounds.   Msk:  normal ROM and no joint tenderness.   Extremities:  trace left pedal edema and trace right pedal edema.   Neurologic:  alert & oriented X3, DTRs symmetrical and normal, and finger-to-nose normal.    Diabetes Management Exam:    Eye Exam:       Eye Exam done elsewhere          Date: 05/29/2008          Results: normal          Done by: groat   Impression & Recommendations:  Problem # 1:  IMPAIRED GLUCOSE TOLERANCE (ICD-271.3) the a 1 c was normal   but she has episodes of hypoglucemia that need diet intervention  Problem # 2:  PERIPHERAL VASCULAR DISEASE (ICD-443.9) discusion of lipid and the recovery time for the recent surgery to redo the leg bypass monitering the lipids  Problem # 3:  HYPERTENSION (ICD-401.9) slight elevaton today record Her updated medication list for this problem includes:    Diovan Hct 160-12.5 Mg Tabs (Valsartan-hydrochlorothiazide) ..... Once daily  BP today: 142/70 Prior BP: 12/70 (01/29/2009)  10 Yr Risk Heart Disease: 11 % Prior 10 Yr Risk Heart Disease: 9 % (09/01/2008)  Labs Reviewed: K+: 4.5 (12/30/2008) Creat: : 0.9 (12/30/2008)   Chol: 133 (12/30/2008)   HDL: 49.70 (12/30/2008)   LDL: 53 (12/30/2008)   TG: 151.0 (12/30/2008)  Problem # 4:  HYPERLIPIDEMIA (ICD-272.4) Assessment: Unchanged lipid monitering today contine the vytorin Her updated medication list for this problem includes:    Vytorin 10-20 Mg Tabs (Ezetimibe-simvastatin) ..... Once daily  Labs Reviewed: SGOT: 18 (05/19/2009)   SGPT: 13 (05/19/2009)  10 Yr Risk Heart  Disease: 11 % Prior 10 Yr Risk Heart Disease: 9 % (09/01/2008)   HDL:49.70 (12/30/2008), 56.2 (03/24/2008)  LDL:53 (12/30/2008), 58 (03/24/2008)  Chol:133 (12/30/2008), 144 (03/24/2008)  Trig:151.0 (12/30/2008), 150 (03/24/2008)  Complete Medication List: 1)  Calcium 600/vitamin D 600-400 Mg-unit Tabs (Calcium carbonate-vitamin d) .... One ppo bid 2)  Diovan Hct 160-12.5 Mg Tabs (Valsartan-hydrochlorothiazide) .... Once daily 3)  Vytorin 10-20 Mg Tabs (Ezetimibe-simvastatin) .... Once daily 4)  Gabapentin 300 Mg Caps (Gabapentin) .Marland Kitchen.. 1 at bedtime 5)  Vitamin D 1000 Unit Caps (Cholecalciferol) .... Once daily 6)  Prilosec 20 Mg Cpdr (Omeprazole) .... Once daily 7)  Ecotrin 325 Mg Tbec (Aspirin) .Marland Kitchen.. 1 once daily 8)  Multivitamins Caps (Multiple vitamin) .... Once daily 9)  Allergy Vaccine 1:50 Gh  .... 2 times a week build-up  Hypertension Assessment/Plan:      The patient's hypertensive risk group is category C: Target organ damage and/or diabetes.  Her calculated 10 year risk of coronary heart disease is 8 %.  Today's blood pressure is 135/70.  Her blood pressure goal is < 140/90.  Lipid Assessment/Plan:      Based on NCEP/ATP III, the patient's risk factor category is "history of coronary disease, peripheral vascular disease, cerebrovascular disease, or aortic aneurysm".  The patient's lipid goals are as follows: Total cholesterol goal is 200; LDL cholesterol goal is 100; HDL cholesterol goal is 40; Triglyceride goal is 150.  Her LDL cholesterol goal has been met.    Patient Instructions: 1)  Please schedule a follow-up appointment in 3 months. 2)  BMP prior to visit, ICD-9:401.9 3)  TSH prior to visit, ICD-9:272.4 4)  CBC w/ Diff prior to visit, ICD-9:272.4

## 2010-05-27 NOTE — Miscellaneous (Signed)
Summary: Order/[facility]  Order/[facility]   Imported By: Sherian Rein 10/15/2009 08:08:22  _____________________________________________________________________  External Attachment:    Type:   Image     Comment:   External Document

## 2010-05-27 NOTE — Miscellaneous (Signed)
Summary: Injection Record/[facility]  Injection Record/[facility]   Imported By: Sherian Rein 08/26/2009 15:02:55  _____________________________________________________________________  External Attachment:    Type:   Image     Comment:   External Document

## 2010-05-27 NOTE — Assessment & Plan Note (Signed)
Summary: 3 MNTH ROV//SLM   Vital Signs:  Patient profile:   77 year old female Height:      62 inches Weight:      168 pounds BMI:     30.84 Temp:     98.2 degrees F oral Pulse rate:   72 / minute Resp:     14 per minute BP sitting:   130 / 70  (left arm)  Vitals Entered By: Willy Eddy, LPN (Aug 25, 2954 9:19 AM) CC: roa labs, Hypertension Management   Primary Care Provider:  Darryll Capers, MD  CC:  roa labs and Hypertension Management.  History of Present Illness: Follow up blood work for CBC and cmet with elevated glucos in the dysmetabilic range we dicsussed diet, carbohydrates and sugar  Hypertension History:      She denies headache, chest pain, palpitations, dyspnea with exertion, orthopnea, PND, peripheral edema, visual symptoms, neurologic problems, syncope, and side effects from treatment.        Positive major cardiovascular risk factors include female age 29 years old or older, hyperlipidemia, and hypertension.  Negative major cardiovascular risk factors include non-tobacco-user status.        Positive history for target organ damage include peripheral vascular disease.  Further assessment for target organ damage reveals no history of stroke/TIA.     Preventive Screening-Counseling & Management  Alcohol-Tobacco     Smoking Status: quit  Current Problems (verified): 1)  Foreign Body in Main Bronchus  (ICD-934.1) 2)  Impaired Glucose Tolerance  (ICD-271.3) 3)  Osteoporosis  (ICD-733.00) 4)  Hip Pain, Left  (ICD-719.45) 5)  Conjunctivitis, Allergic  (ICD-372.14) 6)  Allergic Rhinitis Cause Unspecified  (ICD-477.9) 7)  Epiphora  (ICD-375.20) 8)  Eczema  (ICD-692.9) 9)  Fh of Colon Cancer  (ICD-153.9) 10)  Postherpetic Neuralgia  (ICD-053.19) 11)  Hiatal Hernia, Hx of  (ICD-V12.79) 12)  Esophageal Stricture  (ICD-530.3) 13)  Duodenitis  (ICD-535.60) 14)  Colonic Polyps, Recurrent  (ICD-211.3) 15)  Colitis  (ICD-558.9) 16)  Shingles, Hx of   (ICD-V13.8) 17)  Peripheral Vascular Disease  (ICD-443.9) 18)  Trigeminal Neuralgia  (ICD-350.1) 19)  Diarrhea, Chronic  (ICD-787.91) 20)  Anxiety State Nec  (ICD-300.09) 21)  Diverticulitis, Colon W/hem  (ICD-562.13) 22)  Family History of Cad Female 1st Degree Relative <50  (ICD-V17.3) 23)  Diverticulosis, Colon  (ICD-562.10) 24)  Colonic Polyps, Hx of  (ICD-V12.72) 25)  Hypertension  (ICD-401.9) 26)  Hyperlipidemia  (ICD-272.4) 27)  Gerd  (ICD-530.81)  Current Medications (verified): 1)  Calcium 600/vitamin D 600-400 Mg-Unit  Tabs (Calcium Carbonate-Vitamin D) .... One Ppo Bid 2)  Diovan Hct 160-12.5 Mg  Tabs (Valsartan-Hydrochlorothiazide) .... Once Daily 3)  Vytorin 10-20 Mg  Tabs (Ezetimibe-Simvastatin) .... Once Daily 4)  Gabapentin 300 Mg  Caps (Gabapentin) .Marland Kitchen.. 1 At Bedtime 5)  Vitamin D 1000 Unit  Caps (Cholecalciferol) .... Once Daily 6)  Prilosec 20 Mg  Cpdr (Omeprazole) .... Once Daily 7)  Ecotrin 325 Mg Tbec (Aspirin) .Marland Kitchen.. 1 Once Daily 8)  Multivitamins   Caps (Multiple Vitamin) .... Once Daily 9)  Allergy Vaccine 1:50 Gh .... 2 Times A Week Build-Up 10)  Tylenol Arthritis Pain 650 Mg Cr-Tabs (Acetaminophen) .Marland Kitchen.. 1-2 By Mouth P Rn Pain  Allergies (verified): 1)  Sulfamethoxazole (Sulfamethoxazole)  Past History:  Family History: Last updated: 08/04/2008 Family History of CAD Female 1st degree relative <50 father Family History of Breast Cancer: Sister x 2 brother/sister- lung cancer Family History of Colon Cancer: Sister Family  History of Colon Polyps: Father Children- allergies Arthritis- mother  Social History: Last updated: 08/04/2008 Retired Former Smoker- 2 ppd x 30 yrs, quit approx 1995 Alcohol Use - no Illicit Drug Use - no\par alone, widowed , part-time Agricultural consultant work  Risk Factors: Smoking Status: quit (08/24/2009)  Past medical, surgical, family and social histories (including risk factors) reviewed, and no changes noted (except as noted  below).  Past Medical History: Reviewed history from 09/11/2008 and no changes required. GERD Hyperlipidemia Hypertension popliteal stenosis Colonic polyps, hx of Diverticulosis, colon ( chronic diarrhea now subsided) Shingles Eczema (DR. Terri Piedra). Latex- contact rash Allergicr rhinoconjunctivitis- ST Pos 09/11/08  Past Surgical History: Reviewed history from 08/04/2008 and no changes required. s/p fem pop bypass with gortex grafts (1996) Tubal ligation Cholecystectomy (1998) Tonsillectomy  Family History: Reviewed history from 08/04/2008 and no changes required. Family History of CAD Female 1st degree relative <50 father Family History of Breast Cancer: Sister x 2 brother/sister- lung cancer Family History of Colon Cancer: Sister Family History of Colon Polyps: Father Children- allergies Arthritis- mother  Social History: Reviewed history from 08/04/2008 and no changes required. Retired Former Smoker- 2 ppd x 30 yrs, quit approx 1995 Alcohol Use - no Illicit Drug Use - no\par alone, widowed , part-time Agricultural consultant work  Review of Systems  The patient denies anorexia, fever, weight loss, weight gain, vision loss, decreased hearing, hoarseness, chest pain, syncope, dyspnea on exertion, peripheral edema, prolonged cough, headaches, hemoptysis, abdominal pain, melena, hematochezia, severe indigestion/heartburn, hematuria, incontinence, genital sores, muscle weakness, suspicious skin lesions, transient blindness, difficulty walking, depression, unusual weight change, abnormal bleeding, enlarged lymph nodes, angioedema, and breast masses.    Physical Exam  General:  overweight.well-developed.   Head:  Normocephalic and atraumatic without obvious abnormalities. No apparent alopecia or balding. Eyes:  conjunctival injection and excessive tearing.   Ears:  R ear normal and L ear normal.   Mouth:  posterior lymphoid hypertrophy and postnasal drip.   Neck:  No deformities, masses,  or tenderness noted. Lungs:  normal respiratory effort and no wheezes.   Heart:  normal rate and regular rhythm.   Abdomen:  soft, non-tender, and normal bowel sounds.   Msk:  normal ROM and no joint tenderness.   Extremities:  trace left pedal edema and trace right pedal edema.   Neurologic:  alert & oriented X3.     Impression & Recommendations:  Problem # 1:  IMPAIRED GLUCOSE TOLERANCE (ICD-271.3) dysmetabolic meets the criterion weight loss and exercize are the key  Problem # 2:  ESOPHAGEAL STRICTURE (ICD-530.3) the stricture is  stable swallowing well  Problem # 3:  HYPERTENSION (ICD-401.9) good blood pressure control Her updated medication list for this problem includes:    Diovan Hct 160-12.5 Mg Tabs (Valsartan-hydrochlorothiazide) ..... Once daily  BP today: 130/70 Prior BP: 135/70 (05/25/2009)  10 Yr Risk Heart Disease: 5 % Prior 10 Yr Risk Heart Disease: 8 % (05/25/2009)  Labs Reviewed: K+: 5.1 (08/18/2009) Creat: : 0.9 (08/18/2009)   Chol: 170 (05/19/2009)   HDL: 63.80 (05/19/2009)   LDL: 53 (12/30/2008)   TG: 224.0 (05/19/2009)  Problem # 4:  OSTEOARTHRITIS, MODERATE (ICD-715.90)  using weight in  rehab has right neck tension with mod radicular pain in right Her updated medication list for this problem includes:    Ecotrin 325 Mg Tbec (Aspirin) .Marland Kitchen... 1 once daily    Tylenol Arthritis Pain 650 Mg Cr-tabs (Acetaminophen) .Marland Kitchen... 1-2 by mouth p rn pain  Discussed use of medications, application of heat  or cold, and exercises.   Complete Medication List: 1)  Calcium 600/vitamin D 600-400 Mg-unit Tabs (Calcium carbonate-vitamin d) .... One ppo bid 2)  Diovan Hct 160-12.5 Mg Tabs (Valsartan-hydrochlorothiazide) .... Once daily 3)  Vytorin 10-20 Mg Tabs (Ezetimibe-simvastatin) .... Once daily 4)  Gabapentin 300 Mg Caps (Gabapentin) .Marland Kitchen.. 1 at bedtime 5)  Vitamin D 1000 Unit Caps (Cholecalciferol) .... Once daily 6)  Prilosec 20 Mg Cpdr (Omeprazole) .... Once  daily 7)  Ecotrin 325 Mg Tbec (Aspirin) .Marland Kitchen.. 1 once daily 8)  Multivitamins Caps (Multiple vitamin) .... Once daily 9)  Allergy Vaccine 1:50 Gh  .... 2 times a week build-up 10)  Tylenol Arthritis Pain 650 Mg Cr-tabs (Acetaminophen) .Marland Kitchen.. 1-2 by mouth p rn pain  Hypertension Assessment/Plan:      The patient's hypertensive risk group is category C: Target organ damage and/or diabetes.  Her calculated 10 year risk of coronary heart disease is 5 %.  Today's blood pressure is 130/70.  Her blood pressure goal is < 140/90.  Patient Instructions: 1)  Please schedule a follow-up appointment in 3 months. 2)  zyrtec daily for allergies 3)  Lipid Panel prior to visit, ICD-9:272.4 4)  TSH prior to visit, ICD-9:272.4 5)  HbgA1C prior to visit, ICD-9:250.00

## 2010-05-27 NOTE — Miscellaneous (Signed)
Summary: Orders Update   Clinical Lists Changes  Orders: Added new Referral order of Radiology Referral (Radiology) - Signed 

## 2010-05-27 NOTE — Progress Notes (Signed)
Summary: confused info wants [name] to call  Phone Note Call from Patient   Caller: daughter,cindy ellington,[phone] Call For: bonnye Reason for Call: Talk to Nurse Summary of Call: Mother got info couple different people and is confused & concerned.  Need to talk to [facility]. Initial call taken by: Rudy Jew, RN,  January 18, 2010 10:12 AM  Follow-up for Phone Call        dr Lovell Sheehan called an d talked with pt and discussed with pt and answered all her question- Follow-up by: Willy Eddy, LPN,  January 18, 2010 11:36 AM

## 2010-05-27 NOTE — Progress Notes (Signed)
Summary: Pts daughter/POA called and is req call back from Dr Lovell Sheehan  Phone Note Call from Patient Call back at [phone] cell   Caller: daughter and POA  - Okey Regal Summary of Call: Pts daughter called and said that she has been expecting a returned call back from Dr. Lovell Sheehan since Tues 01-19-10. Pls call back at earliest convenience.  Initial call taken by: Lucy Antigua,  January 22, 2010 9:48 AM  Follow-up for Phone Call        called and discussed case Follow-up by: Stacie Glaze MD,  January 22, 2010 9:50 AM  New Problems: CHRONIC OBSTRUCTIVE PULMONARY DISEASE (ICD-496)   New Problems: CHRONIC OBSTRUCTIVE PULMONARY DISEASE (ICD-496)

## 2010-05-27 NOTE — Progress Notes (Signed)
Summary: continued arm pain  Phone Note Call from Patient   Caller: Patient Call For: Stacie Glaze MD Summary of Call: Pt has discontinued chol meds x 2 weeks but she is stil having arm pain. [phone] Initial call taken by: Lynann Beaver CMA,  December 15, 2009 12:31 PM  Follow-up for Phone Call        per dr Gwenyth Bouillon zocor and send to orthpedist for arm pain Follow-up by: Willy Eddy, LPN,  December 15, 2009 3:39 PM  Additional Follow-up for Phone Call Additional follow up Details #1::        Left message on pt's personal voice mail. Additional Follow-up by: Lynann Beaver CMA,  December 15, 2009 3:49 PM  New Problems: ARM PAIN, RIGHT (ICD-729.5)   New Problems: ARM PAIN, RIGHT (ICD-729.5)

## 2010-05-27 NOTE — Miscellaneous (Signed)
Summary: Injection Record / [facility]    Injection Record / [facility]    Imported By: Lennie Odor 12/25/2009 09:50:19  _____________________________________________________________________  External Attachment:    Type:   Image     Comment:   External Document

## 2010-05-27 NOTE — Assessment & Plan Note (Signed)
Summary: SHINGLES/J PT/PS   Vital Signs:  Patient profile:   77 year old female Height:      62 inches (157.48 cm) Weight:      168 pounds (76.36 kg) O2 Sat:      98 % on Room air Temp:     97.7 degrees F (36.50 degrees C) oral Pulse rate:   69 / minute BP sitting:   144 / 72  (left arm) Cuff size:   regular  Vitals Entered By: Josph Macho RMA (November 27, 2009 2:03 PM)  O2 Flow:  Room air  Serial Vital Signs/Assessments:  Time      Position  BP       Pulse  Resp  Temp     By                     138/72                         Danise Edge MD  CC: Possible shingles on back of neck X 1 week/ CF Is Patient Diabetic? No   History of Present Illness: Patient in today for evaluation of some skin lesions. She has been struggling with a pruritic rash on the back of her neck for several days now. The rash is enlarging and beginning to burn so she has become afraid that a new case of shingles is developing. She has had a very bad time with shingles in the past and to this date has occasional posterherpetic pain in right flank. No fevers/chills/malaise/URI or GU symptoms. She is also noting some small lesions on b/l breasts, she first noted them when getting out of the shower. They are erythematous patches unchanged from when she first noticed them. Not pruritic or painful  Current Medications (verified): 1)  Calcium 600/vitamin D 600-400 Mg-Unit  Tabs (Calcium Carbonate-Vitamin D) .... One Ppo Bid 2)  Diovan Hct 160-12.5 Mg  Tabs (Valsartan-Hydrochlorothiazide) .... Once Daily 3)  Simvastatin 40 Mg Tabs (Simvastatin) .Marland Kitchen.. 1 Once Daily 4)  Gabapentin 300 Mg  Caps (Gabapentin) .Marland Kitchen.. 1 At Bedtime 5)  Vitamin D 1000 Unit  Caps (Cholecalciferol) .... Once Daily 6)  Prilosec 20 Mg  Cpdr (Omeprazole) .... Once Daily 7)  Ecotrin 325 Mg Tbec (Aspirin) .Marland Kitchen.. 1 Once Daily 8)  Multivitamins   Caps (Multiple Vitamin) .... Once Daily 9)  Allergy Vaccine 1:10 Gh .... From 1:50 At Next Order. 10)   Tylenol Arthritis Pain 650 Mg Cr-Tabs (Acetaminophen) .Marland Kitchen.. 1-2 By Mouth P Rn Pain  Allergies (verified): 1)  Sulfamethoxazole (Sulfamethoxazole)  Past History:  Past medical history reviewed for relevance to current acute and chronic problems. Social history (including risk factors) reviewed for relevance to current acute and chronic problems.  Past Medical History: Reviewed history from 09/11/2008 and no changes required. GERD Hyperlipidemia Hypertension popliteal stenosis Colonic polyps, hx of Diverticulosis, colon ( chronic diarrhea now subsided) Shingles Eczema (DR. Terri Piedra). Latex- contact rash Allergicr rhinoconjunctivitis- ST Pos 09/11/08  Social History: Reviewed history from 08/04/2008 and no changes required. Retired Former Smoker- 2 ppd x 30 yrs, quit approx 1995 Alcohol Use - no Illicit Drug Use - no\par alone, widowed , part-time Agricultural consultant work  Review of Systems      See HPI  Physical Exam  General:  Well-developed,well-nourished,in no acute distress; alert,appropriate and cooperative throughout examination Head:  Normocephalic and atraumatic without obvious abnormalities Lungs:  Normal respiratory effort, chest expands symmetrically. Lungs are clear  to auscultation, no crackles or wheezes. Heart:  Normal rate and regular rhythm. S1 and S2 normal without gallop, click, rub or other extra sounds.grade  2/6 systolic murmur.   Abdomen:  Bowel sounds positive,abdomen soft and non-tender without masses, organomegaly or hernias noted. Extremities:  No clubbing, cyanosis, edema, or deformity noted    Skin:  1 cm macular lesion, erythematous and slightly scaly on left breast, 2 similar lesions on right breast. 2 x 5 cm raised, erythematous patch over left posterior neck from hair line down to base of neck Cervical Nodes:  No lymphadenopathy noted Psych:  Cognition and judgment appear intact. Alert and cooperative with normal attention span and concentration. No  apparent delusions, illusions, hallucinations   Impression & Recommendations:  Problem # 1:  CELLULITIS AND ABSCESS OF NECK (ICD-682.1)  Her updated medication list for this problem includes:    Keflex 500 Mg Caps (Cephalexin) .Marland Kitchen... 1 cap by mouth 4 x daily x 7 days Continue washing with warm water and Dove daily, eat a daily yogurt while on Abx and f/u with PMD next week  Orders: Prescription Created Electronically [phone])  Problem # 2:  TINEA CORPORIS (ICD-110.5)  b/l breasts, Clotrimazole cream apply two times a day to lesions if no resolution needs to have further evaluation  Orders: Prescription Created Electronically [phone])  Problem # 3:  HYPERTENSION (ICD-401.9)  Her updated medication list for this problem includes:    Diovan Hct 160-12.5 Mg Tabs (Valsartan-hydrochlorothiazide) ..... Once daily On repeat adequate control noted no change in therapy  Complete Medication List: 1)  Calcium 600/vitamin D 600-400 Mg-unit Tabs (Calcium carbonate-vitamin d) .... One ppo bid 2)  Diovan Hct 160-12.5 Mg Tabs (Valsartan-hydrochlorothiazide) .... Once daily 3)  Simvastatin 40 Mg Tabs (Simvastatin) .Marland Kitchen.. 1 once daily 4)  Gabapentin 300 Mg Caps (Gabapentin) .Marland Kitchen.. 1 at bedtime 5)  Vitamin D 1000 Unit Caps (Cholecalciferol) .... Once daily 6)  Prilosec 20 Mg Cpdr (Omeprazole) .... Once daily 7)  Ecotrin 325 Mg Tbec (Aspirin) .Marland Kitchen.. 1 once daily 8)  Multivitamins Caps (Multiple vitamin) .... Once daily 9)  Allergy Vaccine 1:10 Gh  .... From 1:50 at next order. 10)  Tylenol Arthritis Pain 650 Mg Cr-tabs (Acetaminophen) .Marland Kitchen.. 1-2 by mouth p rn pain 11)  Keflex 500 Mg Caps (Cephalexin) .Marland Kitchen.. 1 cap by mouth 4 x daily x 7 days 12)  Clotrimazole 1 % Crea (Clotrimazole) .... Apply to lesions two times a day until gone or 30days.  Patient Instructions: 1)  Please schedule an appointment with your primary doctor next week  as sceduled 2)  Take your antibiotic as prescribed until ALL of it is gone,  but stop if you develop a rash or swelling and contact our office as soon as possible.  3)  Continue to cleanse daily with warm water and Dove soap 4)  Apply Clotrimazole to lesions on chest wall as directed Prescriptions: CLOTRIMAZOLE 1 % CREA (CLOTRIMAZOLE) apply to lesions two times a day until gone or 30days.  #1 tube x 1   Entered and Authorized by:   Danise Edge MD   Signed by:   Danise Edge MD on 11/27/2009   Method used:   Electronically to        [facility] [address] (retail)       [address] [place], [place]  [address]       Ph: [phone]  Fax: [phone]   RxID:   [id number] KEFLEX 500 MG CAPS (CEPHALEXIN) 1 cap by mouth 4 x daily x 7 days  #28 x 0   Entered and Authorized by:   Danise Edge MD   Signed by:   Danise Edge MD on 11/27/2009   Method used:   Electronically to        [facility] [address] (retail)       [address] [place], [place]  [address]       Ph: [phone]       Fax: [phone]   RxID:   [id number]

## 2010-05-27 NOTE — Letter (Signed)
Summary: [facility]   Imported By: Maryln Gottron 01/11/2010 12:35:29  _____________________________________________________________________  External Attachment:    Type:   Image     Comment:   External Document

## 2010-05-27 NOTE — Progress Notes (Signed)
Summary: shortwinded & BP elev at neurol appt  Phone Note Call from Patient Call back at Home Phone [phone]   Caller: vm Summary of Call: To doctor at [facility] & she said to call - short winded & BP elevated.  She is faxing Dr. Shela Commons to see me.   Initial call taken by: Rudy Jew, RN,  January 11, 2010 2:02 PM  Follow-up for Phone Call        3:30 Dr. Abner Greenspan per Dr. Shela Commons.   Follow-up by: Rudy Jew, RN,  January 11, 2010 2:07 PM

## 2010-05-28 DIAGNOSIS — J301 Allergic rhinitis due to pollen: Secondary | ICD-10-CM

## 2010-05-28 NOTE — Procedures (Unsigned)
BYPASS GRAFT EVALUATION  INDICATION:  Follow up bilateral femoral bypass grafts.  HISTORY: Diabetes:  No. Cardiac:  No. Hypertension:  Yes. Smoking:  Previous. Previous Surgery:  Bilateral femoral-popliteal bypass grafts in 1996. Thrombolysis angioplasty of the right fem-pop bypass graft on 11/15/2008.  Thrombectomy/angioplasty with interpositional graft from the above-knee to below-knee popliteal artery on 04/07/2009.  SINGLE LEVEL ARTERIAL EXAM                              RIGHT              LEFT Brachial:                    128                125 Anterior tibial:             113                126 Posterior tibial:            128                117 Peroneal: Ankle/brachial index:        1.0                0.98  PREVIOUS ABI:  Date: 02/01/2010  RIGHT:  0.97  LEFT:  1.09  LOWER EXTREMITY BYPASS GRAFT DUPLEX EXAM:  DUPLEX:  Triphasic/biphasic waveforms noted throughout bilateral bypass grafts with elevated velocities within the left upper thigh at 2.00 m/s.  IMPRESSION: 1. Stable ankle brachial index. 2. Patent bilateral femoral-popliteal bypass grafts with elevated     velocities, as described above.  ___________________________________________ Janetta Hora. Fields, MD  OD/MEDQ  D:  05/06/2010  T:  05/06/2010  Job:  [id number]

## 2010-05-31 ENCOUNTER — Ambulatory Visit (INDEPENDENT_AMBULATORY_CARE_PROVIDER_SITE_OTHER)
Admission: RE | Admit: 2010-05-31 | Discharge: 2010-05-31 | Disposition: A | Discharge status: Home / Self Care | Payer: Medicare Other | Source: Ambulatory Visit | Attending: Critical Care Medicine | Admitting: Critical Care Medicine

## 2010-05-31 ENCOUNTER — Encounter: Payer: Self-pay | Admitting: Critical Care Medicine

## 2010-05-31 DIAGNOSIS — R911 Solitary pulmonary nodule: Secondary | ICD-10-CM

## 2010-05-31 DIAGNOSIS — J984 Other disorders of lung: Secondary | ICD-10-CM

## 2010-06-02 ENCOUNTER — Other Ambulatory Visit: Payer: Self-pay

## 2010-06-02 NOTE — Progress Notes (Signed)
Summary: follow up ct in Jan or Feb  Phone Note Call from Patient   Call For: DR Delford Field Summary of Call: Patient called stated that when she saw Dr Delford Field in the fall and was told that she should have a follow up CT and she wants to know if the has been scheduled. She stated that he told her to have the follow up CT in Jan or Feb. Patient can be reached at [phone]. If she is not available please leave a message and she will get back with [place]. Initial call taken by: Vedia Coffer,  May 25, 2010 9:26 AM  Follow-up for Phone Call        [facility].  Looks like PW already sent order to [facility] back on 02/11/2010 to schedule f/u CT in 6 months.  Will forward this message to [facility] to schedule pt for appt.  Aundra Millet Reynolds LPN  May 25, 2010 11:03 AM   Additional Follow-up for Phone Call Additional follow up Details #1::        noncontrast chest ct@[facility]  05/31/10@9 :00am pt is aware of this appt Additional Follow-up by: Oneita Jolly,  May 25, 2010 1:59 PM

## 2010-06-04 DIAGNOSIS — J301 Allergic rhinitis due to pollen: Secondary | ICD-10-CM

## 2010-06-07 ENCOUNTER — Telehealth (INDEPENDENT_AMBULATORY_CARE_PROVIDER_SITE_OTHER): Payer: Self-pay | Admitting: *Deleted

## 2010-06-10 NOTE — Miscellaneous (Signed)
Summary: Orders Update   Clinical Lists Changes  Orders: Added new Referral order of Radiology Referral (Radiology) - Signed 

## 2010-06-10 NOTE — Miscellaneous (Signed)
Summary: CT Chest    Clinical Lists Changes  Observations: Added new observation of CT OF CHEST: IMPRESSION:   1.  Stable pulmonary nodules in the right middle lobe over 20-month interval.  These lesions were not hypermetabolic on comparison CT. Recommend follow-up CT thorax (noncontrast) in  6 months from today's date then at 24 months to establish long-term stability as a low grade adenocarcinoma cannot be excluded.   (05/31/2010 14:48)      CT of Chest  Procedure date:  05/31/2010  Findings:      IMPRESSION:   1.  Stable pulmonary nodules in the right middle lobe over 76-month interval.  These lesions were not hypermetabolic on comparison CT. Recommend follow-up CT thorax (noncontrast) in  6 months from today's date then at 24 months to establish long-term stability as a low grade adenocarcinoma cannot be excluded.    Appended Document: CT Chest  CT shows no change in nodules since 10/11. Plan will be to recheck CT chest in 6months one more time,  if stable at that time will not need further scanning.

## 2010-06-11 ENCOUNTER — Ambulatory Visit (INDEPENDENT_AMBULATORY_CARE_PROVIDER_SITE_OTHER): Payer: Medicare Other

## 2010-06-11 ENCOUNTER — Encounter: Payer: Self-pay | Admitting: Internal Medicine

## 2010-06-11 DIAGNOSIS — J301 Allergic rhinitis due to pollen: Secondary | ICD-10-CM

## 2010-06-16 NOTE — Progress Notes (Signed)
Summary: prescription  LMTCBX1  Phone Note Call from Patient   Caller: Patient Call For: Young Summary of Call: Patient phoned stated that the last time she was in Dr Maple Hudson gave her a sample of Ipratropium Bromide and she thought that he told her to call if she thought that it helped and he would give her a prescription for this. She stated that this has helped her and would lie a prescription called in she uses [facility] on [address]. Patient can be reached at [phone] Initial call taken by: Vedia Coffer,  June 07, 2010 3:40 PM  Follow-up for Phone Call        pt was seen by CY on 04-01-2010 and he sent a rx x 11 refills to [facility].  Unsure what she would be calling for refills.  called and spoke with Florentina Addison at [facility] and verified that indeed pt does have a rx on file with as needed refills.  LMOMTCBX1 to inform pt of the above information.  Aundra Millet Reynolds LPN  June 07, 2010 4:56 PM   Pt returning call.Darletta Moll  June 07, 2010 4:57 PM   Additional Follow-up for Phone Call Additional follow up Details #1::        pt called back.  informed her of the above information.  nothing further needed.  Aundra Millet Reynolds LPN  June 07, 2010 5:00 PM

## 2010-06-17 ENCOUNTER — Ambulatory Visit: Payer: Self-pay | Admitting: Internal Medicine

## 2010-06-18 ENCOUNTER — Ambulatory Visit (INDEPENDENT_AMBULATORY_CARE_PROVIDER_SITE_OTHER): Payer: Medicare Other

## 2010-06-18 ENCOUNTER — Encounter: Payer: Self-pay | Admitting: Internal Medicine

## 2010-06-18 DIAGNOSIS — J301 Allergic rhinitis due to pollen: Secondary | ICD-10-CM

## 2010-06-21 ENCOUNTER — Encounter: Payer: Self-pay | Admitting: Internal Medicine

## 2010-06-21 ENCOUNTER — Ambulatory Visit: Payer: Medicare Other | Admitting: Internal Medicine

## 2010-06-21 VITALS — BP 136/76 | HR 72 | Temp 98.2°F | Resp 14 | Ht 62.0 in | Wt 170.0 lb

## 2010-06-21 DIAGNOSIS — J449 Chronic obstructive pulmonary disease, unspecified: Secondary | ICD-10-CM

## 2010-06-21 DIAGNOSIS — I10 Essential (primary) hypertension: Secondary | ICD-10-CM

## 2010-06-21 DIAGNOSIS — L259 Unspecified contact dermatitis, unspecified cause: Secondary | ICD-10-CM

## 2010-06-21 DIAGNOSIS — J984 Other disorders of lung: Secondary | ICD-10-CM

## 2010-06-21 DIAGNOSIS — R0602 Shortness of breath: Secondary | ICD-10-CM

## 2010-06-21 LAB — BASIC METABOLIC PANEL
BUN: 16 mg/dL (ref 6–23)
Chloride: 104 mEq/L (ref 96–112)
GFR: 87.7 mL/min (ref >60.00)
Potassium: 4.6 mEq/L (ref 3.5–5.1)
Sodium: 142 mEq/L (ref 135–145)

## 2010-06-21 NOTE — Assessment & Plan Note (Signed)
Referral to [facility] for therapy

## 2010-06-21 NOTE — Progress Notes (Signed)
  Subjective:    Patient ID: [name] [name], female    DOB: 1924/05/22, 77 y.o.   MRN: [id number]  HPI    Review of Systems     Objective:   Physical Exam        Assessment & Plan:

## 2010-06-21 NOTE — Assessment & Plan Note (Signed)
The patient has been stable without use of any of her inhalers at this time she has  no rescue inhaler use

## 2010-06-21 NOTE — Assessment & Plan Note (Signed)
Shortness of breath is improved with exercise

## 2010-06-21 NOTE — Assessment & Plan Note (Signed)
Stableon the diovan HCTZ with stable creatinine and stable potassium do a basic metabolic panel to monitor these parameters

## 2010-06-21 NOTE — Assessment & Plan Note (Signed)
Dr. Delford Field has ordered a CT scan for 6 months if there is demonstrated stability of the nodule for another 6 months. He is recommending no further CT scans.

## 2010-06-25 ENCOUNTER — Ambulatory Visit (INDEPENDENT_AMBULATORY_CARE_PROVIDER_SITE_OTHER): Payer: Medicare Other

## 2010-06-25 ENCOUNTER — Encounter: Payer: Self-pay | Admitting: Internal Medicine

## 2010-06-25 DIAGNOSIS — J301 Allergic rhinitis due to pollen: Secondary | ICD-10-CM | POA: Insufficient documentation

## 2010-07-01 NOTE — Assessment & Plan Note (Signed)
Summary: ALLERGY/CB   Nurse Visit   Allergies: 1)  Sulfamethoxazole (Sulfamethoxazole) 2)  * Latex  Orders Added: 1)  Allergy Injection (1) [16109]

## 2010-07-02 ENCOUNTER — Ambulatory Visit (INDEPENDENT_AMBULATORY_CARE_PROVIDER_SITE_OTHER): Payer: Medicare Other

## 2010-07-02 ENCOUNTER — Encounter: Payer: Self-pay | Admitting: Internal Medicine

## 2010-07-02 DIAGNOSIS — J301 Allergic rhinitis due to pollen: Secondary | ICD-10-CM

## 2010-07-07 LAB — GLUCOSE, CAPILLARY: Glucose-Capillary: 130 mg/dL — ABNORMAL HIGH (ref 70–99)

## 2010-07-09 ENCOUNTER — Ambulatory Visit (INDEPENDENT_AMBULATORY_CARE_PROVIDER_SITE_OTHER): Payer: Medicare Other

## 2010-07-09 ENCOUNTER — Encounter: Payer: Self-pay | Admitting: Internal Medicine

## 2010-07-09 DIAGNOSIS — J301 Allergic rhinitis due to pollen: Secondary | ICD-10-CM

## 2010-07-13 NOTE — Assessment & Plan Note (Signed)
Summary: allergy/cb   Nurse Visit   Allergies: 1)  Sulfamethoxazole (Sulfamethoxazole) 2)  * Latex  Orders Added: 1)  Allergy Injection (1) [95115] 

## 2010-07-13 NOTE — Assessment & Plan Note (Signed)
Summary: allergy/cb   Nurse Visit   Allergies: 1)  Sulfamethoxazole (Sulfamethoxazole) 2)  * Latex  Orders Added: 1)  Allergy Injection (1) [45409]

## 2010-07-13 NOTE — Miscellaneous (Signed)
Summary: [facility] [employer]   Imported By: Sherian Rein 07/05/2010 14:34:07  _____________________________________________________________________  External Attachment:    Type:   Image     Comment:   External Document

## 2010-07-16 ENCOUNTER — Ambulatory Visit (INDEPENDENT_AMBULATORY_CARE_PROVIDER_SITE_OTHER): Payer: Medicare Other

## 2010-07-16 DIAGNOSIS — J301 Allergic rhinitis due to pollen: Secondary | ICD-10-CM

## 2010-07-23 ENCOUNTER — Ambulatory Visit (INDEPENDENT_AMBULATORY_CARE_PROVIDER_SITE_OTHER): Payer: Medicare Other

## 2010-07-23 DIAGNOSIS — J301 Allergic rhinitis due to pollen: Secondary | ICD-10-CM

## 2010-07-27 LAB — CREATININE, SERUM
Creatinine, Ser: 0.73 mg/dL (ref 0.4–1.2)
GFR calc Af Amer: >60 mL/min (ref >60)

## 2010-07-27 LAB — CBC
HCT: 35.4 % — ABNORMAL LOW (ref 36.0–46.0)
HCT: 38.9 % (ref 36.0–46.0)
Hemoglobin: 10.7 g/dL — ABNORMAL LOW (ref 12.0–15.0)
Hemoglobin: 12.3 g/dL (ref 12.0–15.0)
Hemoglobin: 13.4 g/dL (ref 12.0–15.0)
MCHC: 34.2 g/dL (ref 30.0–36.0)
MCHC: 34.7 g/dL (ref 30.0–36.0)
MCHC: 35 g/dL (ref 30.0–36.0)
MCV: 84.8 fL (ref 78.0–100.0)
MCV: 84.8 fL (ref 78.0–100.0)
MCV: 84.8 fL (ref 78.0–100.0)
MCV: 84.9 fL (ref 78.0–100.0)
Platelets: 125 10*3/uL — ABNORMAL LOW (ref 150–400)
Platelets: 147 10*3/uL — ABNORMAL LOW (ref 150–400)
Platelets: 156 10*3/uL (ref 150–400)
Platelets: 178 10*3/uL (ref 150–400)
RBC: 3.64 MIL/uL — ABNORMAL LOW (ref 3.87–5.11)
RBC: 3.74 MIL/uL — ABNORMAL LOW (ref 3.87–5.11)
RBC: 4.07 MIL/uL (ref 3.87–5.11)
RBC: 4.17 MIL/uL (ref 3.87–5.11)
RBC: 4.58 MIL/uL (ref 3.87–5.11)
RDW: 13.3 % (ref 11.5–15.5)
RDW: 13.5 % (ref 11.5–15.5)
RDW: 13.6 % (ref 11.5–15.5)
WBC: 10.3 10*3/uL (ref 4.0–10.5)
WBC: 11.1 10*3/uL — ABNORMAL HIGH (ref 4.0–10.5)
WBC: 8.8 10*3/uL (ref 4.0–10.5)
WBC: 8.9 10*3/uL (ref 4.0–10.5)

## 2010-07-27 LAB — BASIC METABOLIC PANEL
BUN: 16 mg/dL (ref 6–23)
BUN: 3 mg/dL — ABNORMAL LOW (ref 6–23)
Chloride: 103 mEq/L (ref 96–112)
Chloride: 96 mEq/L (ref 96–112)
Creatinine, Ser: 0.72 mg/dL (ref 0.4–1.2)
GFR calc Af Amer: >60 mL/min (ref >60)
GFR calc non Af Amer: 58 mL/min — ABNORMAL LOW (ref >60)
GFR calc non Af Amer: >60 mL/min (ref >60)
Potassium: 4.2 mEq/L (ref 3.5–5.1)
Sodium: 139 mEq/L (ref 135–145)

## 2010-07-27 LAB — TYPE AND SCREEN: Antibody Screen: POSITIVE

## 2010-07-27 LAB — URINALYSIS, ROUTINE W REFLEX MICROSCOPIC
Glucose, UA: NEGATIVE mg/dL
Ketones, ur: NEGATIVE mg/dL
Nitrite: NEGATIVE
Protein, ur: NEGATIVE mg/dL
pH: 5.5 (ref 5.0–8.0)

## 2010-07-27 LAB — APTT: aPTT: 30 seconds (ref 24–37)

## 2010-07-27 LAB — FIBRINOGEN
Fibrinogen: 236 mg/dL (ref 204–475)
Fibrinogen: 248 mg/dL (ref 204–475)

## 2010-07-27 LAB — HEPARIN LEVEL (UNFRACTIONATED): Heparin Unfractionated: 0.17 IU/mL — ABNORMAL LOW (ref 0.30–0.70)

## 2010-07-27 LAB — GLUCOSE, CAPILLARY: Glucose-Capillary: 143 mg/dL — ABNORMAL HIGH (ref 70–99)

## 2010-08-01 LAB — COMPREHENSIVE METABOLIC PANEL
AST: 22 U/L (ref 0–37)
BUN: 19 mg/dL (ref 6–23)
CO2: 25 mEq/L (ref 19–32)
Chloride: 100 mEq/L (ref 96–112)
Creatinine, Ser: 0.79 mg/dL (ref 0.4–1.2)
GFR calc non Af Amer: >60 mL/min (ref >60)
Total Bilirubin: 0.7 mg/dL (ref 0.3–1.2)

## 2010-08-01 LAB — CBC
HCT: 39 % (ref 36.0–46.0)
HCT: 39.9 % (ref 36.0–46.0)
HCT: 41 % (ref 36.0–46.0)
HCT: 43 % (ref 36.0–46.0)
Hemoglobin: 13.4 g/dL (ref 12.0–15.0)
Hemoglobin: 13.8 g/dL (ref 12.0–15.0)
Hemoglobin: 14.8 g/dL (ref 12.0–15.0)
MCHC: 33.3 g/dL (ref 30.0–36.0)
MCHC: 33.7 g/dL (ref 30.0–36.0)
MCHC: 33.8 g/dL (ref 30.0–36.0)
MCHC: 34.4 g/dL (ref 30.0–36.0)
MCHC: 34.5 g/dL (ref 30.0–36.0)
MCHC: 34.5 g/dL (ref 30.0–36.0)
MCV: 85.8 fL (ref 78.0–100.0)
MCV: 86 fL (ref 78.0–100.0)
MCV: 86.1 fL (ref 78.0–100.0)
MCV: 86.2 fL (ref 78.0–100.0)
MCV: 86.2 fL (ref 78.0–100.0)
MCV: 86.2 fL (ref 78.0–100.0)
MCV: 86.3 fL (ref 78.0–100.0)
Platelets: 148 10*3/uL — ABNORMAL LOW (ref 150–400)
Platelets: 162 10*3/uL (ref 150–400)
Platelets: 195 10*3/uL (ref 150–400)
RBC: 4.5 MIL/uL (ref 3.87–5.11)
RBC: 4.62 MIL/uL (ref 3.87–5.11)
RBC: 4.62 MIL/uL (ref 3.87–5.11)
RBC: 5.01 MIL/uL (ref 3.87–5.11)
RDW: 13.4 % (ref 11.5–15.5)
RDW: 13.7 % (ref 11.5–15.5)
RDW: 14 % (ref 11.5–15.5)
RDW: 14 % (ref 11.5–15.5)
RDW: 14 % (ref 11.5–15.5)
RDW: 14.1 % (ref 11.5–15.5)
WBC: 11.2 10*3/uL — ABNORMAL HIGH (ref 4.0–10.5)
WBC: 13 10*3/uL — ABNORMAL HIGH (ref 4.0–10.5)
WBC: 15.3 10*3/uL — ABNORMAL HIGH (ref 4.0–10.5)

## 2010-08-01 LAB — FIBRINOGEN
Fibrinogen: 285 mg/dL (ref 204–475)
Fibrinogen: 285 mg/dL (ref 204–475)

## 2010-08-01 LAB — BASIC METABOLIC PANEL
CO2: 28 mEq/L (ref 19–32)
CO2: 28 mEq/L (ref 19–32)
Calcium: 8.8 mg/dL (ref 8.4–10.5)
Chloride: 103 mEq/L (ref 96–112)
Chloride: 105 mEq/L (ref 96–112)
Creatinine, Ser: 0.7 mg/dL (ref 0.4–1.2)
GFR calc Af Amer: >60 mL/min (ref >60)
Glucose, Bld: 119 mg/dL — ABNORMAL HIGH (ref 70–99)
Potassium: 4.6 mEq/L (ref 3.5–5.1)
Sodium: 139 mEq/L (ref 135–145)

## 2010-08-01 LAB — TYPE AND SCREEN: ABO/RH(D): O NEG

## 2010-08-01 LAB — GLUCOSE, CAPILLARY: Glucose-Capillary: 106 mg/dL — ABNORMAL HIGH (ref 70–99)

## 2010-08-01 LAB — HEPARIN LEVEL (UNFRACTIONATED)
Heparin Unfractionated: 0.17 IU/mL — ABNORMAL LOW (ref 0.30–0.70)
Heparin Unfractionated: 0.19 IU/mL — ABNORMAL LOW (ref 0.30–0.70)

## 2010-08-01 LAB — CREATININE, SERUM
Creatinine, Ser: 0.82 mg/dL (ref 0.4–1.2)
GFR calc Af Amer: >60 mL/min (ref >60)

## 2010-08-01 LAB — MRSA PCR SCREENING

## 2010-08-01 LAB — PROTIME-INR: Prothrombin Time: 12.9 seconds (ref 11.6–15.2)

## 2010-08-01 LAB — APTT: aPTT: 30 seconds (ref 24–37)

## 2010-08-02 ENCOUNTER — Ambulatory Visit (INDEPENDENT_AMBULATORY_CARE_PROVIDER_SITE_OTHER): Payer: Medicare Other

## 2010-08-02 ENCOUNTER — Encounter: Payer: Self-pay | Admitting: Internal Medicine

## 2010-08-02 ENCOUNTER — Ambulatory Visit (INDEPENDENT_AMBULATORY_CARE_PROVIDER_SITE_OTHER): Payer: Medicare Other | Admitting: Internal Medicine

## 2010-08-02 VITALS — BP 118/64 | HR 61 | Ht 62.0 in | Wt 177.0 lb

## 2010-08-02 DIAGNOSIS — J301 Allergic rhinitis due to pollen: Secondary | ICD-10-CM

## 2010-08-02 DIAGNOSIS — H1045 Other chronic allergic conjunctivitis: Secondary | ICD-10-CM

## 2010-08-02 DIAGNOSIS — L259 Unspecified contact dermatitis, unspecified cause: Secondary | ICD-10-CM

## 2010-08-02 DIAGNOSIS — J309 Allergic rhinitis, unspecified: Secondary | ICD-10-CM

## 2010-08-02 NOTE — Assessment & Plan Note (Signed)
Not clear if there is a connection between her lacrimation and the eczema.

## 2010-08-02 NOTE — Patient Instructions (Signed)
Sample Spiriva - 1 daily. This may improve your breathing some, but I am particularly interested it whether it helps the watery eyes.

## 2010-08-02 NOTE — Assessment & Plan Note (Signed)
We are going to let her try Spiriva sample, to see if the anticholinergic effects can dry her eyes for her.

## 2010-08-02 NOTE — Progress Notes (Signed)
  Subjective:    Patient ID: [name] [name], female    DOB: 08-31-1924, 77 y.o.   MRN: [id number]  HPI 11 yoF former smoker, followed here for allergic rhinitis on allergy vaccine, and COPD. She is bothered on chronic basis by her watery, burning eyes. She just saw Dr Dione Booze, with all his drops tried. She is worse in past 1-2 months with Spring pollen season. She is resigned to staying this way. Seeing Dr Terri Piedra for significant eczema of fingertips.  She remains satisfied that the allergy vaccine is helpful and worth cntinuing.  Review of Systems See HPI Constitutional:   No weight loss, night sweats,  Fevers, chills, fatigue, lassitude. HEENT:   No headaches,  Difficulty swallowing,  Tooth/dental problems,  Sore throat,               Minimal sneezing, itching, ear ache, nasal congestion, post nasal drip,   CV:  No chest pain,  Orthopnea, PND, swelling in lower extremities, anasarca, dizziness, palpitations  GI  No heartburn, indigestion, abdominal pain, nausea, vomiting, diarrhea, change in bowel habits, loss of appetite  Resp: No shortness of breath with exertion or at rest.  No excess mucus, no productive cough,  No non-productive cough,  No coughing up of blood.  No change in color of mucus.  No wheezing.  Skin: no rash or lesions.  GU: no dysuria, change in color of urine, no urgency or frequency.  No flank pain.  MS:  No joint pain or swelling.  No decreased range of motion.  No back pain.  Psych:  No change in mood or affect. No depression or anxiety.  No memory loss.      Objective:   Physical Exam General- Alert, Oriented, Affect-appropriate, Distress- none acute  Skin- deep peeling all finger tips- eczema  Lymphadenopathy- none  Head- atraumatic  Eyes- Gross vision intact, PERRLA, conjunctivae clear/ secretions are overflowing, She blots at eyes with handkerchief  Ears-  Normal-Hearing, canals, Tm L ,   R ,  Nose- Clear, Septal dev, mucus, polyps, erosion,  perforation   Throat- Mallampati II , mucosa clear , drainage- none, tonsils- atrophic  Neck- flexible , trachea midline, no stridor , thyroid nl, carotid no bruit  Chest - symmetrical excursion , unlabored     Heart/CV- RRR , no murmur , no gallop  , no rub, nl s1 s2                     - JVD- none , edema- none, stasis changes- none, varices- none     Lung- clear to P&A, wheeze- none, cough- none , dullness-none, rub- none     Chest wall-   Abd- tender-no, distended-no, bowel sounds-present, HSM- no  Br/ Gen/ Rectal- Not done, not indicated  Extrem- cyanosis- none, clubbing, none, atrophy- none, strength- nl  Neuro- grossly intact to observation         Assessment & Plan:

## 2010-08-05 ENCOUNTER — Encounter: Payer: Self-pay | Admitting: Internal Medicine

## 2010-08-05 NOTE — Assessment & Plan Note (Signed)
There has been an atopic component in the past, but at her age, more of the bothersome overflow is likely non-allergic. She has seen enough eye doctors that lacrimal duct obstruction should have been noted.

## 2010-08-10 ENCOUNTER — Telehealth: Payer: Self-pay | Admitting: Internal Medicine

## 2010-08-10 NOTE — Telephone Encounter (Signed)
Pt states since using Spiriva the watery eyes have reduced significantly, however, "not a whole lot" with the breathing. Pt states Dr. Maple Hudson advised her to call back with report and she wants to know what is the next step. If prescription for Spiriva is sent pt requesting 90 supply. Please advise. Thank you.  Drug Allergies: Latex, Sulfa drugs

## 2010-08-11 NOTE — Telephone Encounter (Signed)
LMTCB in the morning to discuss recs with patient.

## 2010-08-11 NOTE — Telephone Encounter (Signed)
I don't have an eyedrop that does what Spiriva does, but we could let her try ipratropium 0.06% nasal spray to see if that would be cheaper for her than Spiriva. -------1 vial,     1-2 puffs in each nostril 3 times daily if needed.

## 2010-08-12 MED ORDER — IPRATROPIUM BROMIDE 0.06 % NA SOLN: NASAL | Status: DC

## 2010-08-12 NOTE — Telephone Encounter (Signed)
I spoke with patient this morning; she is aware of CDY's recs and request the Rx be sent to [facility] on [address]. Pt will call me back once she has used the rx to let me know if she would like to have a 90 day supply sent for her. Otherwise we will need to ask CDY to advise other recs.

## 2010-08-13 ENCOUNTER — Ambulatory Visit (INDEPENDENT_AMBULATORY_CARE_PROVIDER_SITE_OTHER): Payer: Medicare Other

## 2010-08-13 DIAGNOSIS — J309 Allergic rhinitis, unspecified: Secondary | ICD-10-CM

## 2010-08-27 ENCOUNTER — Ambulatory Visit (INDEPENDENT_AMBULATORY_CARE_PROVIDER_SITE_OTHER): Payer: Medicare Other

## 2010-08-27 DIAGNOSIS — J309 Allergic rhinitis, unspecified: Secondary | ICD-10-CM

## 2010-08-30 ENCOUNTER — Telehealth: Payer: Self-pay | Admitting: Critical Care Medicine

## 2010-08-30 DIAGNOSIS — R911 Solitary pulmonary nodule: Secondary | ICD-10-CM

## 2010-08-30 NOTE — Telephone Encounter (Signed)
Pt needs CT chest non contrasted scheduled, to f/u RML lung nodule See orders

## 2010-08-30 NOTE — Telephone Encounter (Signed)
Chest ct non contrast@[facility]  09/01/10@11 :00am

## 2010-09-02 ENCOUNTER — Ambulatory Visit (INDEPENDENT_AMBULATORY_CARE_PROVIDER_SITE_OTHER)
Admission: RE | Admit: 2010-09-02 | Discharge: 2010-09-02 | Disposition: A | Discharge status: Home / Self Care | Payer: Medicare Other | Source: Ambulatory Visit | Attending: Critical Care Medicine | Admitting: Critical Care Medicine

## 2010-09-02 DIAGNOSIS — R911 Solitary pulmonary nodule: Secondary | ICD-10-CM

## 2010-09-02 DIAGNOSIS — J984 Other disorders of lung: Secondary | ICD-10-CM

## 2010-09-03 ENCOUNTER — Ambulatory Visit (INDEPENDENT_AMBULATORY_CARE_PROVIDER_SITE_OTHER): Payer: Medicare Other

## 2010-09-03 DIAGNOSIS — J309 Allergic rhinitis, unspecified: Secondary | ICD-10-CM

## 2010-09-07 NOTE — Assessment & Plan Note (Signed)
[facility] OFFICE NOTE   NAME:Buczynski, LAURELAI LEPP                     MRN:          [id number]  DATE:10/10/2006                            DOB:          [birth year]    A 77 year old female with a 2-week history of left hemifacial pain. She  describes frequent daily episodes of sharp fleeting pain in the left  maxillary facial area. These are aggravated by eating, brushing teeth,  etc. Examination revealed normal blood pressure. There is no tenderness  over the TMJ area. There is no problems with facial sensation or facial  weakness. ENT unremarkable. There are no carotid bruits.   IMPRESSION:  Left hemifacial pain, probable trigeminal neuralgia V2  distribution.   DISPOSITION:  She will be given a trial of Tegretol 100 mg b.i.d. She  has been asked to return in 1 week. Depending on her clinical response  may increase the dose at that time. Will also check a CBC, electrolytes  and liver function studies.     Gordy Savers, MD  Electronically Signed    PFK/MedQ  DD: 10/10/2006  DT: 10/11/2006  Job #: [id number]

## 2010-09-07 NOTE — Procedures (Signed)
BYPASS GRAFT EVALUATION   INDICATION:  Follow-up bilateral lower extremity bypass graft.   HISTORY:  Diabetes:  No.  Cardiac:  No.  Hypertension:  Yes.  Smoking:  No.  Previous Surgery :Bilateral fem-pop bypass graft in November 1996, by  Dr. Orson Slick.   SINGLE LEVEL ARTERIAL EXAM                               RIGHT              LEFT  Brachial:                    122                117  Anterior tibial:             105                116  Posterior tibial:            109                108  Peroneal:  Ankle/brachial index:        0.89               0.95   PREVIOUS ABI:  Date: 09/07/2007  RIGHT:  0.87  LEFT:  0.72   LOWER EXTREMITY BYPASS GRAFT DUPLEX EXAM:   DUPLEX:  Biphasic Doppler waveforms noted throughout the bilateral lower  extremity bypass grafts and their native vessels with no increase in  Doppler velocities noted.   IMPRESSION:  1. Patent bilateral fem-pop bypass grafts with no evidence of      stenosis.  2. Stable bilateral ankle brachial indices noted when compared to the      previous exams.   ___________________________________________  Larina Earthly, M.D.   CH/MEDQ  D:  02/29/2008  T:  02/29/2008  Job:  [id number]

## 2010-09-07 NOTE — Procedures (Signed)
BYPASS GRAFT EVALUATION   INDICATION:  Follow up bilateral fem-pop bypass graft.   HISTORY:  Diabetes:  No.  Cardiac:  No.  Hypertension:  Yes.  Smoking:  No.  Previous Surgery:  Bilateral fem-pop bypass graft.   SINGLE LEVEL ARTERIAL EXAM                               RIGHT              LEFT  Brachial:                    144                124  Anterior tibial:             129                132  Posterior tibial:            109                107  Peroneal:  Ankle/brachial index:        0.90               0.92   PREVIOUS ABI:  Date: 09/11/2006  RIGHT:  0.88  LEFT:  0.84   LOWER EXTREMITY BYPASS GRAFT DUPLEX EXAM:   DUPLEX:  Patent bilateral fem-pop bypass graft with no evidence of focal  stenosis.   IMPRESSION:  1. Patent bilateral femoropopliteal bypass graft with no evidence of      focal stenosis.  2. Mildly abnormal ankle brachial index with biphasic Doppler      waveforms noted in bilateral legs, status post bilateral      femoropopliteal bypass graft.   ___________________________________________  Larina Earthly, M.D.   MG/MEDQ  D:  03/05/2007  T:  03/06/2007  Job:  [id number]

## 2010-09-07 NOTE — Procedures (Signed)
BYPASS GRAFT EVALUATION   INDICATION:  Follow-up evaluation of bilateral lower extremity bypass  grafts.  Patient complains of chronic bilateral hip and calf  claudication, which is not worsened in the last few years.   HISTORY:  Diabetes:  No.  Cardiac:  No.  Hypertension:  Yes.  Smoking:  No.  Previous Surgery:  Bilateral fem-pop bypass grafts with Gore-Tex in  November, 1996 by Dr. Orson Slick.   SINGLE LEVEL ARTERIAL EXAM                               RIGHT              LEFT  Brachial:                    136                132  Anterior tibial:             118                98  Posterior tibial:            98                 88  Peroneal:  Ankle/brachial index:        0.87               0.72   PREVIOUS ABI:  Date: 03/05/07  RIGHT:  0.90  LEFT:  0.92   LOWER EXTREMITY BYPASS GRAFT DUPLEX EXAM:   DUPLEX:  Doppler arterial waveforms are triphasic proximal to, within,  and distal to the bypass grafts bilaterally.   IMPRESSION:  1. Patent femoropopliteal bypass grafts bilaterally.  2. Right ankle brachial index is stable from previous study.  3. Left ankle brachial index is slightly lower than previously      recorded.   ___________________________________________  Larina Earthly, M.D.   MC/MEDQ  D:  09/07/2007  T:  09/07/2007  Job:  [id number]

## 2010-09-07 NOTE — Assessment & Plan Note (Signed)
[facility]                                 ON-CALL NOTE   [name], [name]                       MRN:          [id number]  DATE:08/26/2007                            DOB:          [birth year]    This is a patient of Dr. Lina Sar.   [name] called today. She has had an uneasy feeling in her lower  abdomen for the past 2-3 days.  Some mild bloating.  She has had more  gas than usual.  She has had no diarrhea.  She tells me she had some  type of colitis several months ago for which she was on steroids and  Imodium, and Dr. Juanda Chance did a flexible sigmoidoscopy.  This sounds like  she had microscopic colitis.  She has had no bleeding and no diarrhea at  all so far.   I recommended that she just simply keep [place] informed of her progress.  She is going out of town to [place] in 3 days, and I think that is  mainly what she is concerned about. She has started taking Imodium 1  pill twice a day as of earlier this morning, and I told her that is fine  to do as long as she does not get constipated.  She will call if things  worsen.     Rachael Fee, MD  Electronically Signed    DPJ/MedQ  DD: 08/26/2007  DT: 08/26/2007  Job #: [id number]   cc:   Hedwig Morton. Juanda Chance, MD

## 2010-09-07 NOTE — Assessment & Plan Note (Signed)
OFFICE VISIT   [name] [name], [name] [name]  DOB:  [birth year]                                       11/26/2008  NFAOZ#:[id number]    The patient is an 77 year old female who recently had acute occlusion of  her right femoral to above knee popliteal bypass.  She was admitted to  [facility] last week for lysis and angioplasty of her distal  anastomosis.  She had a good technical result of the distal anastomosis  with increased perfusion to her right foot and successful thrombolysis.  She returns today for further followup.   PHYSICAL EXAM:  Blood pressure is 123/73, pulse is 80 and regular.  Lower extremities:  She has 1+ dorsalis pedis pulses bilaterally.  Feet  are pink, warm and well-perfused.  Left groin puncture site is well-  healed with no evidence of pseudoaneurysm.  Numbness and tingling that  was present in her right foot at the time of her fem-pop occlusion is  now completely resolved.  Also the short distance claudication she had  in her right leg is now completely resolved.   She had bilateral ABIs today which were 0.8 on the right and 0.88 on the  left.  Waveforms were biphasic in character.  Right side is a  significant improvement from November 15, 2008, at which time the ABI had  fallen to 0.37.   I had a lengthy discussion with the patient today regarding continued  followup for her lower extremity bypass grafts and also to certainly  call us again if she had any signs or symptoms of graft occlusion which  she clearly recognizes at this point.  We will see her at 3 month  intervals initially to make sure that she has not had any restenosis of  her distal anastomosis on the right side.  She will then continue in our  bypass graft surveillance protocol as before.   Janetta Hora. Fields, MD  Electronically Signed   CEF/MEDQ  D:  11/27/2008  T:  11/27/2008  Job:  [id number]   cc:   Stacie Glaze, MD

## 2010-09-07 NOTE — H&P (Signed)
HISTORY AND PHYSICAL EXAMINATION   November 14, 2008   Re:  [place], [place]              DOB:  1925-03-15   The patient is a very pleasant, active 77 year old white female who  presents today with right foot pain.  She is status post bilateral  staged Gore-Tex fem-pop bypasses by Dr. Lebron Conners.  She reports  this was done for bilateral claudication in 1996.  I have seen her  several times in the office for followup of this.  She had most recently  been seen in our office for vascular lab protocol in May.  At that time  she had an ankle arm index of 0.66 on the right and 0.76 on the left  with some elevated velocities of both the proximal and distal  anastomoses bilaterally.  She presents today reporting new sensation of  pain, aching and a severe claudication in her right foot and calf.  She  does have numbness and tingling as well.  This is all new, and had no  prior episodes of this.   PAST MEDICAL HISTORY:  Negative for cardiac disease or diabetes.  She  does not smoke.  She does have history of hypertension.   ALLERGIES:  To sulfa drugs.   MEDICATIONS:  Prednisone for back pain, multivitamins, vitamin D,  Vytorin, Diovan, Prilosec, Aleve, gabapentin.   PHYSICAL EXAMINATION:  A well-developed, well-nourished white female  appearing younger than stated age of 70.  Systolic blood pressure is 176  in the brachial artery.  Her chest is clear bilaterally.  Heart regular  rate and rhythm.  She does have palpable femoral pulses bilaterally.  She does have palpable left posterior tibial pulse with a warm foot.  I  do not feel pulses on the right and her foot is somewhat cool compared  to the left.   She underwent noninvasive vascular laboratory studies in our office.  This reveals a near normal ankle arm index on the left at 0.87, on the  right she has absent flow in her posterior tibial and peroneal arteries  and has markedly diminished flow in her anterior  tibial artery.   IMPRESSION:  Acute occlusion of a right prostatic fem-pop bypass today.   PLAN:  The patient will be transferred to [facility] where she  will be evaluated by Dr. Fabienne Bruns who is on call for our practice.  I discussed options with the patient to include surgical thrombectomy  and revision versus attempt at thrombolysis.  She understands this is  limb threatening.  She will proceed to the hospital from our office.   Larina Earthly, M.D.  Electronically Signed   TFE/MEDQ  D:  11/14/2008  T:  11/15/2008  Job:  [id number]   cc:   Stacie Glaze, MD

## 2010-09-07 NOTE — Discharge Summary (Signed)
[name] [name], [name] [name]              ACCOUNT NO.:  [id number]   MEDICAL RECORD NO.:  [id number]          PATIENT TYPE:  INP   LOCATION:  2008                         FACILITY:  [facility]   PHYSICIAN:  Janetta Hora. Fields, MD  DATE OF BIRTH:  05-09-1924   DATE OF ADMISSION:  11/14/2008  DATE OF DISCHARGE:  11/18/2008                               DISCHARGE SUMMARY   FINAL DISCHARGE DIAGNOSES:  1. Right lower extremity clot and femoral popliteal bypass graft.  2. Peripheral vascular disease.  3. Hypertension.  4. Dyslipidemia.   PROCEDURE PERFORMED:  Clot lysis by Radiology.   COMPLICATIONS:  None.   CONDITION AT DISCHARGE:  Stable and improving.   DISCHARGE MEDICATIONS:  She is instructed to resume all previous  medications consisting of;  1. Prednisone __________ p.o. daily.  2. Aspirin 325 mg p.o. daily.  3. Multivitamin one p.o. daily.  4. Calcium 500 mg 2 tablets p.o. daily.  5. Vitamin D 1000 mg 2 tablets p.o. daily.  6. Prilosec 20 mg p.o. daily.  7. Diovan/hydrochlorothiazide 160/12.5 mg p.o. daily.  8. Vytorin 10/20 p.o. daily.  9. Neurontin 300 mg p.o. nightly.   She is given prescription for Percocet 5/325 one to two p.o. q.4 h.  p.r.n. pain and Plavix 75 mg p.o. daily for the next 6 weeks.   DISPOSITION:  She is being discharged home in stable condition.  She is  given careful instructions specifically, she is not to lift objects  weighing greater than 10 pounds for the next week or so.  She is to  return to work in 1 week.  She is to follow up with Dr. Darrick Penna in 4  weeks for evaluation.   BRIEF IDENTIFYING STATEMENT:  For complete details, please refer to the  typed history and physical.  Briefly, this 77 year old woman presented  to the emergency department with right lower extremity pain.  She was  evaluated by Dr. Darrick Penna and found to have a clot in a femoral-popliteal  bypass graft.  Dr. Darrick Penna recommended lysis of the clot.  She was  informed of the risks and  benefits of the procedure and after careful  consideration, elected to proceed.   HOSPITAL COURSE:  She was admitted to a bed on a step-down unit.  Lysis  catheters were placed.  She underwent successful clot lysis.  She is  liable to be removed on July 26.  She was transferred to a bed on a  surgical convalescent floor.  Her activity level was then advanced as  tolerated.  The following morning, she was desirous of discharge and was  discharged home in stable condition.      Wilmon Arms, PA      Janetta Hora. Fields, MD  Electronically Signed    KEL/MEDQ  D:  11/18/2008  T:  11/18/2008  Job:  [id number]

## 2010-09-07 NOTE — Procedures (Signed)
BYPASS GRAFT EVALUATION   INDICATION:  Follow up right lower extremity bypass grafts.   HISTORY:  Diabetes:  No.  Cardiac:  No.  Hypertension:  Yes.  Smoking:  No.  Previous Surgery:  Bilateral femoropopliteal bypass grafts in 1996,  thrombolysis/angioplasty of the right fem-pop bypass graft on  11/15/2008, thrombectomy/angioplasty with interpositional graft from the  above-knee to below-knee popliteal artery on 04/07/2009.   SINGLE LEVEL ARTERIAL EXAM                               RIGHT              LEFT  Brachial:                    125                127  Anterior tibial:             123                139  Posterior tibial:            111                131  Peroneal:  Ankle/brachial index:        0.97               1.09   PREVIOUS ABI:  Date: 07/08/2009  RIGHT:  0.78  LEFT:  0.79   LOWER EXTREMITY BYPASS GRAFT DUPLEX EXAM:   DUPLEX:  Biphasic Doppler waveforms noted throughout the right lower  extremity bypass graft in its native vessels with no increase in  velocities noted.   IMPRESSION:  1. Patent right femoropopliteal and interpositional above-knee to      below-knee popliteal artery bypass grafts with no evidence of      stenosis.  2. Stable bilateral ankle brachial indices noted when compared to the      previous studies.   ___________________________________________  Janetta Hora Fields, MD   CH/MEDQ  D:  02/02/2010  T:  02/02/2010  Job:  [id number]

## 2010-09-07 NOTE — Procedures (Signed)
BYPASS GRAFT EVALUATION   INDICATION:  Follow up evaluation of lower extremity bypass graft.   HISTORY:  Diabetes:  No.  Cardiac:  No.  Hypertension:  Yes.  Smoking:  No.  Previous Surgery:  Bilateral fem-pop bypass graft in November 2006 by  Dr. Orson Slick.   SINGLE LEVEL ARTERIAL EXAM                               RIGHT              LEFT  Brachial:                    123                135  Anterior tibial:             80                 103  Posterior tibial:            89                 93  Peroneal:  Ankle/brachial index:        0.66               0.76   PREVIOUS ABI:  Date: 02/29/2008  RIGHT:  0.89  LEFT:  0.95   LOWER EXTREMITY BYPASS GRAFT DUPLEX EXAM:   DUPLEX:  1. The right fem-pop bypass graft has increased velocities of 232 cm/s      at the proximal anastomosis, 216 cm/s at the distal graft and 285      cm/s at the distal anastomosis.  2. The left fem-pop has increased velocities of 295 cm/s at the inflow      artery and 246 cm/s at the proximal anastomosis.   IMPRESSION:  Bilateral lower extremity ABIs suggest moderate to severe  arterial disease with biphasic Doppler waveform.   ___________________________________________  Larina Earthly, M.D.   AC/MEDQ  D:  08/29/2008  T:  08/29/2008  Job:  [id number]

## 2010-09-07 NOTE — Assessment & Plan Note (Signed)
[facility]                         GASTROENTEROLOGY OFFICE NOTE   NAME:[name] [name], [name] [name]                     MRN:          [id number]  DATE:12/08/2006                            DOB:          1924-08-21    [name] [name] is a very nice 77 year old patient of Dr. Darryll Capers' who  has had an acute diarrheal illness.  It started 3 weeks ago with sudden  onset diarrhea, which persisted for the past 3 weeks and is slowly  getting better.  There was no abdominal pain.  Stools are rather watery  without any specific odor.  There was no blood in the stools.  There was  no nausea or vomiting, or fever.  The episode occurred within 24 hours  after eating out in a restaurant with a group of friends.  None of them  developed diarrhea.  She had a colonoscopy for followup of colon polyps  by Dr. Corinda Gubler on Aug 29, 2006.  She was found to have diverticulosis of  the left colon and multiple polyps, which were all hyperplastic.   OTHER MEDICAL PROBLEMS:  Include eczema of the hands, gastroesophageal  reflux.  She had a tubal ligation in the past.  High blood pressure.   MEDICATIONS:  1. Calcium and vitamin D.  2. Aspirin 325 mg p.o. daily.  3. Diovan 160/25 one p.o. daily.  4. Multiple vitamins.  5. Prilosec 20 mg p.o. daily.  6. Vytorin 10/20 one p.o. daily.  7. Aggrenox 25/200 two tablets daily.  8. She was also started on Cipro 250 p.o. b.i.d.  9. Flagyl 250 mg p.o. q.i.d.  She completed 1 course of it and started      a second course 4 days ago.   PHYSICAL EXAM:  Blood pressure 136/60, pulse 70, and weight 174 pounds.  She was overweight, alert and oriented, in no distress.  LUNGS:  Clear to auscultation.  COR:  Normal S1, normal S2.  ABDOMEN:  Mildly hyperactive bowel sounds.  No abnormal rushes and no  distension.  There was no tenderness.  RECTAL:  Normal rectal tone.  Stool was soft, Hemoccult negative.  EXTREMITIES:  No edema.  Exam of the hands showed  eczema.   IMPRESSION:  A 77 year old white female with acute diarrheal illness  consistent with infectious gastroenteritis, possibly salmonella,  possibly viral.  The diarrhea is painless, most likely due to ischemic  colitis.  Very unlikely due to new onset inflammatory bowel disease.  She seems to be getting better.  Frequency of the stool has decreased,  and there are no longer any nocturnal bowel movements.   PLAN:  1. Continue Cipro and Flagyl as prescribed by Dr. Lovell Sheehan.  2. We obtained stool for culture, pathogens, O&P, and clostridium      difficile.  3. Imodium 1 p.o. daily.  The patient to call me within the week to      confirm continued improvement.  If not improved, consider flexible      sigmoidoscopy and a biopsy.     [name] Morton. Juanda Chance, MD  Electronically Signed    DMB/MedQ  DD: 12/08/2006  DT: 12/08/2006  Job #: [id number]   cc:   [name] Glaze, MD

## 2010-09-07 NOTE — Procedures (Signed)
BYPASS GRAFT EVALUATION   INDICATION:  Follow up bilateral femoral-popliteal bypass graft in 2006.   HISTORY:  Diabetes:  No.  Cardiac:  No.  Hypertension:  Yes.  Smoking:  No.  Previous Surgery:  Bilateral femoral-popliteal bypass graft in 2006,  thrombolysis/angioplasty on right femoral-popliteal bypass graft on  11/15/08, thrombectomy and balloon angioplasty, 04/06/09 with  interposition graft from above-knee to below-knee, 04/07/09.   SINGLE LEVEL ARTERIAL EXAM                               RIGHT              LEFT  Brachial:                    120                121  Anterior tibial:             94                 95  Posterior tibial:            90                 95  Peroneal:  Ankle/brachial index:        0.78               0.79   PREVIOUS ABI:  Date: 04/23/09  RIGHT:  0.95  LEFT:  1.05   LOWER EXTREMITY BYPASS GRAFT DUPLEX EXAM:   DUPLEX:  Patent bilateral femoral-popliteal and right above-knee to  below-knee graft.  There appears to be elevated velocities at the left  common femoral artery of 209 cm/s and the right outflow level of 134  cm/s.   IMPRESSION:  1. Bilateral ankle brachial indices have dropped and suggest moderate      arterial disease.  2. Patent bilateral femoral bypass grafts with elevated velocities, as      described above.   ___________________________________________  Janetta Hora. Fields, MD   CB/MEDQ  D:  07/08/2009  T:  07/08/2009  Job:  [id number]

## 2010-09-07 NOTE — Procedures (Signed)
BYPASS GRAFT EVALUATION   INDICATION:  Follow-up evaluation of bilateral femoral popliteal bypass  graft.   HISTORY:  Diabetes:  No.  Cardiac:  No.  Hypertension:  Yes.  Smoking:  No.  Previous Surgery:  Bilateral femoral-popliteal bypass grafts in 2006.  A  thrombolysis angioplasty on right femoral-popliteal bypass graft on  11/15/08.   SINGLE LEVEL ARTERIAL EXAM                               RIGHT              LEFT  Brachial:                    135                129  Anterior tibial:             111                126  Posterior tibial:            99                 97  Peroneal:  Ankle/brachial index:        0.82               0.93   PREVIOUS ABI:  Date: 11/26/08  RIGHT:  0.80  LEFT:  0.88   LOWER EXTREMITY BYPASS GRAFT DUPLEX EXAM:   DUPLEX:  Patent bilateral femoral-popliteal bypass grafts with biphasic  arterial Doppler waveforms.  The right bypass graft has elevated  velocities at the outflow level of 281 cm/s.  The left femoral-popliteal  bypass graft has increased velocities at the inflow and proximal graft.   IMPRESSION:  1. Stable ankle brachial indices in comparison to previous study.  2. Patent bilateral femoral-popliteal bypass grafts.  3. The right femoral-popliteal bypass graft suggests 50-75% stenosis      at the outflow level.        ___________________________________________  Janetta Hora. Fields, MD   CB/MEDQ  D:  03/03/2009  T:  03/03/2009  Job:  [id number]

## 2010-09-07 NOTE — Assessment & Plan Note (Signed)
[facility]                         GASTROENTEROLOGY OFFICE NOTE   NAME:[name] [name]                     MRN:          [id number]  DATE:05/03/2007                            DOB:          21-Jan-1925    [name] [name] is an 77 year old white female with newly diagnosed  lymphocytic colitis.  This was demonstrated on a colonic biopsy done  during colonoscopy on December 20, 2006 for __________  of diarrhea.  She  had increased intraepithelial inflammation and features consistent with  lymphocytic colitis.  She responded to low dose steroids which were  tapered off at the end of November, 2008.  She has been off steroids  taking Imodium once or twice a day with complete resolution of the  diarrhea.  She comes satisfied with her condition.   PHYSICAL EXAMINATION:  Blood pressure: 122/68.  Pulse:  72.  Weight:  178 pounds.  The patient has gradually gained about 20 pounds in the  last several years since the death of her husband in 1998-09-06.  Abdomen was  soft but moderately diffusely tender mostly across the transverse colon  and left lower quadrant.  Bowel sounds were normal and active.  There  was no tympani.  Rectal exam not repeated.   IMPRESSION:  77 year old white female with newly diagnosed  lymphocytic colitis currently under good control off steroids.  Taking  only Imodium, one a day.   PLAN:  1. Continue the same regimen.  2. Serious attempt for weight loss.  3. Office visit in six months.  Consider using ________ next time.     [name] [name]. Juanda Chance, MD  Electronically Signed    DMB/MedQ  DD: 05/03/2007  DT: 05/03/2007  Job #: [id number]   cc:   Stacie Glaze, MD

## 2010-09-07 NOTE — Assessment & Plan Note (Signed)
OFFICE VISIT   [name], [name] B  DOB:  18-Jun-1924                                       04/23/2009  ZOXWR#:[id number]   The patient returns for followup today.  She recently reoccluded her  right femoral to above-knee popliteal bypass and admitted to hospital  for thrombolysis.  We then revised the distal anastomosis by moving her  above-knee anastomosis down to the below-knee popliteal segment.  She  returns today for further followup.   PHYSICAL EXAM:  Temperature is 97.4, oxygen saturation 100%, blood  pressure in her left arm is 128/70, heart rate 78 and regular.  Right  lower extremity incisions above and below the knee are well-healed, and  staples are removed today.  She has a 1+ dorsalis pedis pulse in the  right foot.  Foot is pink, warm and well-perfused.  She had bilateral  ABIs today which were 0.95 on the right, 1.05 on the left with triphasic  to biphasic waveforms.   Overall the patient looks well at this point.  Her bypass is patent in  the right leg.  ABIs also suggested bypass in the left leg is widely  patent.  She will continue on our graft surveillance protocol.  Hopefully this will be a durable revision for her.     Janetta Hora. Fields, MD  Electronically Signed   CEF/MEDQ  D:  04/24/2009  T:  04/28/2009  Job:  [id number]   cc:   Stacie Glaze, MD

## 2010-09-07 NOTE — Assessment & Plan Note (Signed)
[name]                         GASTROENTEROLOGY OFFICE NOTE   NAME:[name], [name] BURGENER                     MRN:          [id number]  DATE:02/05/2007                            DOB:          Oct 27, 1924    The patient is an 77 year old white female who was just diagnosed with  lymphocytic colitis.  This was on flexible sigmoidoscopy done on December 20, 2006, after she presented with unrelenting diarrhea.  Her  colonoscopy in May of 2008 showed only diverticulosis of the colon.  It  appears that the diarrhea was associated with her starting on Tegretol  for presumed trigeminal neuralgia.  The pain in her face subsided, but  she has developed diarrhea.   MEDICATIONS:  Diovan, Prilosec, Vytorin, and calcium supplements.   Since we put her on prednisone 20 mg a day the patient has had complete  resolution of the colitis and diarrhea.  She went down to 15 mg a day  for two weeks and 10 mg for two weeks.  This morning she had her first  episode of diarrhea after being on 10 mg of prednisone for about 10  days.  She has been under a great deal of stress.  She woke up at 4 a.m.  with crampy abdominal pain and had some diarrhea.   PHYSICAL EXAMINATION:  VITAL SIGNS:  Blood pressure 140/58, pulse 78,  and weight 173 pounds.  This is unchanged from last time.  GENERAL:  She was alert and oriented and in no distress.  The patient  was not examined today.   IMPRESSION:  An 77 year old white female with lymphocytic colitis on  biopsies from flexible sigmoidoscopy on December 20, 2006.  She responded  to systemic steroids, but has relapsed after decreasing to 10 mg.  She  has been at the same time on a great deal of stress.   PLAN:  Continue on prednisone 10 mg a day until the end of this week  when she is supposed to switch to 5 mg a day.  She will let me know by  the end of the week if the diarrhea continues.  She has so far had only  one isolated episode this  morning which could have been related to  stress.  If she has breakthrough diarrhea, we will have to increase her  prednisone and consider using 6-mercaptopurine if it appears that she  will have a steroid requirement on a longterm basis.   1. Continue prednisone 10 mg a day.  2. Call by the end of the week.  3. Consider increasing prednisone to 12.5 mg a day. We will send by      doctor first prednisone 5 mg tablets so she can break it in half in      case we have to go down slow.    Hedwig Morton. Juanda Chance, MD  Electronically Signed   DMB/MedQ  DD: 02/05/2007  DT: 02/05/2007  Job #: [id number]   cc:   Stacie Glaze, MD

## 2010-09-07 NOTE — Procedures (Signed)
BYPASS GRAFT EVALUATION   INDICATION:  Sudden onset of right leg pain this morning.   HISTORY:  Diabetes:  No.  Cardiac:  No.  Hypertension:  Yes.  Smoking:  No.  Previous Surgery:  Bilateral fem-pop bypass grafts in November of 2006  by Dr. Orson Slick with Gore-Tex.   SINGLE LEVEL ARTERIAL EXAM                               RIGHT              LEFT  Brachial:                    180                176  Anterior tibial:             66                 144  Posterior tibial:            Absent  Peroneal:                    Absent             158  Ankle/brachial index:        0.37               0.87   PREVIOUS ABI:  Date:  08/29/2008  RIGHT:  0.66  LEFT:  0.76   LOWER EXTREMITY BYPASS GRAFT DUPLEX EXAM:   DUPLEX:  Limited to and fro flow is seen in the proximal portion of the  right fem-pop bypass graft.  The remaining portion of the right fem-pop bypass graft appears to be  occluded.   IMPRESSION:  1. Occluded right fem-pop bypass graft.  2. Right ABI has decreased significantly since previous study.  3. Left ABI is stable compared to previous study and suggests a patent      left fem-pop bypass graft.   ___________________________________________  Larina Earthly, M.D.   MC/MEDQ  D:  11/14/2008  T:  11/14/2008  Job:  [id number]

## 2010-09-10 ENCOUNTER — Ambulatory Visit (INDEPENDENT_AMBULATORY_CARE_PROVIDER_SITE_OTHER): Payer: Medicare Other

## 2010-09-10 DIAGNOSIS — J309 Allergic rhinitis, unspecified: Secondary | ICD-10-CM

## 2010-09-10 NOTE — Assessment & Plan Note (Signed)
[name] [name]                         [name] OFFICE NOTE   NAME:[name], [name] [name]                     MRN:          [id number]  DATE:07/19/2006                            DOB:          Feb 08, 1925    The patient comes in as a new office visit for discussion about a five  year colonoscopy followup. Her last procedure was April 2003, and she  had multiple small polyps and was advised to have a followup endoscopy  in five years. She has been doing well health wise. She does get some  dysphagia and heartburn and indigestion at times and Prilosec seems to  help.   Her other medications include:  1. Aspirin.  2. Vytorin.  3. Aggrenox.   FAMILY HISTORY:  Reveals her sister and father have polyps of the colon  and a sister had colon cancer.   PAST MEDICAL HISTORY:  1. She has had hypertension for over 20 years.  2. Has hyperlipidemia.  3. Allergies.  4. Status post tubal ligation.  5. Status post cholecystectomy.   SOCIAL HISTORY:  Is really noncontributory.   REVIEW OF SYSTEMS:  Reveals occasional skin rash and some back pain.   PHYSICAL EXAMINATION:  She is 5 feet, 2 inches. Weight is 174. Blood  pressure is 120/60. Pulse 78 and regular.  NECK, HEART, EXTREMITIES: Are all basically unremarkable.  She looked very good for her age.   IMPRESSION:  1. Hypertension.  2. Status post cholecystectomy and tubal ligation.  3. Allergies.  4. Hyperlipidemia.  5. Mild obesity.   RECOMMENDATIONS:  Is to be scheduled for colonoscopic examination some  time in the near future.     Ulyess Mort, MD  Electronically Signed    SML/MedQ  DD: 07/19/2006  DT: 07/19/2006  Job #: [id number]

## 2010-09-13 ENCOUNTER — Ambulatory Visit (INDEPENDENT_AMBULATORY_CARE_PROVIDER_SITE_OTHER): Payer: Medicare Other | Admitting: Internal Medicine

## 2010-09-13 ENCOUNTER — Encounter: Payer: Self-pay | Admitting: Internal Medicine

## 2010-09-13 ENCOUNTER — Other Ambulatory Visit: Payer: Self-pay | Admitting: *Deleted

## 2010-09-13 ENCOUNTER — Ambulatory Visit (INDEPENDENT_AMBULATORY_CARE_PROVIDER_SITE_OTHER)
Admission: RE | Admit: 2010-09-13 | Discharge: 2010-09-13 | Disposition: A | Discharge status: Home / Self Care | Payer: Medicare Other | Source: Ambulatory Visit | Attending: Internal Medicine | Admitting: Internal Medicine

## 2010-09-13 VITALS — BP 140/86 | HR 72 | Temp 98.2°F | Resp 16 | Ht 62.0 in | Wt 170.0 lb

## 2010-09-13 DIAGNOSIS — E785 Hyperlipidemia, unspecified: Secondary | ICD-10-CM

## 2010-09-13 DIAGNOSIS — J301 Allergic rhinitis due to pollen: Secondary | ICD-10-CM

## 2010-09-13 DIAGNOSIS — I10 Essential (primary) hypertension: Secondary | ICD-10-CM

## 2010-09-13 DIAGNOSIS — M545 Low back pain, unspecified: Secondary | ICD-10-CM

## 2010-09-13 DIAGNOSIS — M79604 Pain in right leg: Secondary | ICD-10-CM

## 2010-09-13 DIAGNOSIS — G8929 Other chronic pain: Secondary | ICD-10-CM | POA: Insufficient documentation

## 2010-09-13 DIAGNOSIS — E559 Vitamin D deficiency, unspecified: Secondary | ICD-10-CM

## 2010-09-13 LAB — BASIC METABOLIC PANEL
CO2: 30 mEq/L (ref 19–32)
Calcium: 10 mg/dL (ref 8.4–10.5)
Chloride: 101 mEq/L (ref 96–112)
Glucose, Bld: 85 mg/dL (ref 70–99)
Sodium: 138 mEq/L (ref 135–145)

## 2010-09-13 MED ORDER — MELOXICAM 15 MG PO TABS: 15.0000 mg | ORAL_TABLET | Freq: Every day | ORAL | Status: DC

## 2010-09-13 NOTE — Assessment & Plan Note (Signed)
The pt states that the pain is relieved by the aleve but when they "wear off" she has pain. Hx of DJD. Needs a back film series and 15 mg of mobic may provide better pain control

## 2010-09-13 NOTE — Progress Notes (Signed)
Subjective:    Patient ID: [name] [name], female    DOB: 1924/10/23, 77 y.o.   MRN: [id number]  HPI patient presents for followup of hypertension with a chief complaint today of severe radicular type back pain radiating down the left leg. She states that she takes Aleve first thing in the morning and she has control of her pain until the Aleve wears off. Then she has moderate to severe. She has a history of degenerative joint disease of the back of unknown level.  But her pain would be most consistent with L5 radiculopathy verses spondylolisthesis.  We will obtain plain films with flexion and extension today to help differentiate this.     Review of Systems  Constitutional: Negative for activity change, appetite change and fatigue.  HENT: Negative for ear pain, congestion, neck pain, postnasal drip and sinus pressure.   Eyes: Negative for redness and visual disturbance.  Respiratory: Negative for cough, shortness of breath and wheezing.   Gastrointestinal: Negative for abdominal pain and abdominal distention.  Genitourinary: Negative for dysuria, frequency and menstrual problem.  Musculoskeletal: Negative for myalgias, joint swelling and arthralgias.  Skin: Negative for rash and wound.  Neurological: Negative for dizziness, weakness and headaches.  Hematological: Negative for adenopathy. Does not bruise/bleed easily.  Psychiatric/Behavioral: Negative for sleep disturbance and decreased concentration.       Past Medical History  Diagnosis Date  . GERD (gastroesophageal reflux disease)   . Hyperlipidemia   . Hypertension   . Stenosis of popliteal artery   . History of colonic polyps   . Diverticulosis   . Shingles   . Eczema   . Allergic rhinoconjunctivitis   . Lung nodule 2011   Past Surgical History  Procedure Date  . Femoral-popliteal bypass graft   . Tubal ligation   . Tonsilectomy, adenoidectomy, bilateral myringotomy and tubes   . Cholecystectomy 1998    reports  that she quit smoking about 17 years ago. She does not have any smokeless tobacco history on file. She reports that she does not drink alcohol or use illicit drugs. family history includes Arthritis in her mother; Breast cancer in her sister; Colon cancer in her sister; Colon polyps in her father; Heart disease in her father; and Lung cancer in her brother and sister. Allergies  Allergen Reactions  . Latex     REACTION: rash  . Sulfamethoxazole     REACTION: disorientation    Objective:   Physical Exam  Constitutional: She is oriented to person, place, and time. She appears well-developed and well-nourished. No distress.  HENT:  Head: Normocephalic and atraumatic.  Right Ear: External ear normal.  Left Ear: External ear normal.  Nose: Nose normal.  Mouth/Throat: Oropharynx is clear and moist.  Eyes: Conjunctivae and EOM are normal. Pupils are equal, round, and reactive to light.  Neck: Normal range of motion. Neck supple. No JVD present. No tracheal deviation present. No thyromegaly present.  Cardiovascular: Normal rate, regular rhythm, normal heart sounds and intact distal pulses.   No murmur heard. Pulmonary/Chest: Effort normal and breath sounds normal. She has no wheezes. She exhibits no tenderness.  Abdominal: Soft. Bowel sounds are normal.  Musculoskeletal: She exhibits tenderness. She exhibits no edema.  Lymphadenopathy:    She has no cervical adenopathy.  Neurological: She is alert and oriented to person, place, and time. She has normal reflexes. No cranial nerve deficit.  Skin: Skin is warm and dry. She is not diaphoretic.  Psychiatric: She has a normal mood and  affect. Her behavior is normal.          Assessment & Plan:  The patient has an acute complaint of low back pain. She will be sent today for back x-rays with flexion and appropriate referral made either to physical therapy or for an MRI depending upon the nature of the disease.  Her blood pressure is  well-controlled her allergies are gradually improving with allergen therapy. .30 Her hand eczema is much improved

## 2010-09-14 LAB — VITAMIN D 25 HYDROXY (VIT D DEFICIENCY, FRACTURES): Vit D, 25-Hydroxy: 49 ng/mL (ref 30–89)

## 2010-09-17 ENCOUNTER — Ambulatory Visit (INDEPENDENT_AMBULATORY_CARE_PROVIDER_SITE_OTHER): Payer: Medicare Other

## 2010-09-17 DIAGNOSIS — J309 Allergic rhinitis, unspecified: Secondary | ICD-10-CM

## 2010-09-21 ENCOUNTER — Other Ambulatory Visit: Payer: Self-pay | Admitting: *Deleted

## 2010-09-21 DIAGNOSIS — M4300 Spondylolysis, site unspecified: Secondary | ICD-10-CM

## 2010-09-24 ENCOUNTER — Ambulatory Visit (INDEPENDENT_AMBULATORY_CARE_PROVIDER_SITE_OTHER): Payer: Medicare Other

## 2010-09-24 DIAGNOSIS — J309 Allergic rhinitis, unspecified: Secondary | ICD-10-CM

## 2010-10-08 ENCOUNTER — Ambulatory Visit (INDEPENDENT_AMBULATORY_CARE_PROVIDER_SITE_OTHER): Payer: Medicare Other

## 2010-10-08 DIAGNOSIS — J309 Allergic rhinitis, unspecified: Secondary | ICD-10-CM

## 2010-10-15 ENCOUNTER — Ambulatory Visit (INDEPENDENT_AMBULATORY_CARE_PROVIDER_SITE_OTHER): Payer: Medicare Other

## 2010-10-15 DIAGNOSIS — J309 Allergic rhinitis, unspecified: Secondary | ICD-10-CM

## 2010-10-22 ENCOUNTER — Other Ambulatory Visit: Payer: Self-pay | Admitting: Critical Care Medicine

## 2010-10-22 ENCOUNTER — Ambulatory Visit (INDEPENDENT_AMBULATORY_CARE_PROVIDER_SITE_OTHER): Payer: Medicare Other

## 2010-10-22 DIAGNOSIS — J984 Other disorders of lung: Secondary | ICD-10-CM

## 2010-10-22 DIAGNOSIS — J309 Allergic rhinitis, unspecified: Secondary | ICD-10-CM

## 2010-10-29 ENCOUNTER — Ambulatory Visit (INDEPENDENT_AMBULATORY_CARE_PROVIDER_SITE_OTHER): Payer: Medicare Other

## 2010-10-29 DIAGNOSIS — J309 Allergic rhinitis, unspecified: Secondary | ICD-10-CM

## 2010-11-01 ENCOUNTER — Encounter: Payer: Self-pay | Admitting: Internal Medicine

## 2010-11-12 ENCOUNTER — Ambulatory Visit (INDEPENDENT_AMBULATORY_CARE_PROVIDER_SITE_OTHER): Payer: Medicare Other

## 2010-11-12 DIAGNOSIS — J309 Allergic rhinitis, unspecified: Secondary | ICD-10-CM

## 2010-11-19 ENCOUNTER — Ambulatory Visit (INDEPENDENT_AMBULATORY_CARE_PROVIDER_SITE_OTHER): Payer: Medicare Other

## 2010-11-19 DIAGNOSIS — J309 Allergic rhinitis, unspecified: Secondary | ICD-10-CM

## 2010-11-25 ENCOUNTER — Ambulatory Visit (INDEPENDENT_AMBULATORY_CARE_PROVIDER_SITE_OTHER): Payer: Medicare Other

## 2010-11-25 DIAGNOSIS — J309 Allergic rhinitis, unspecified: Secondary | ICD-10-CM

## 2010-11-26 ENCOUNTER — Ambulatory Visit (INDEPENDENT_AMBULATORY_CARE_PROVIDER_SITE_OTHER): Payer: Medicare Other

## 2010-11-26 DIAGNOSIS — J309 Allergic rhinitis, unspecified: Secondary | ICD-10-CM

## 2010-11-29 ENCOUNTER — Encounter (INDEPENDENT_AMBULATORY_CARE_PROVIDER_SITE_OTHER): Payer: Medicare Other

## 2010-11-29 DIAGNOSIS — I70309 Unspecified atherosclerosis of unspecified type of bypass graft(s) of the extremities, unspecified extremity: Secondary | ICD-10-CM

## 2010-11-29 DIAGNOSIS — Z48812 Encounter for surgical aftercare following surgery on the circulatory system: Secondary | ICD-10-CM

## 2010-11-30 ENCOUNTER — Other Ambulatory Visit: Payer: Self-pay | Admitting: Internal Medicine

## 2010-12-02 ENCOUNTER — Encounter: Payer: Self-pay | Admitting: Internal Medicine

## 2010-12-02 ENCOUNTER — Ambulatory Visit (INDEPENDENT_AMBULATORY_CARE_PROVIDER_SITE_OTHER): Payer: Medicare Other | Admitting: Internal Medicine

## 2010-12-02 VITALS — BP 140/80 | HR 72 | Temp 98.2°F | Resp 16 | Ht 62.0 in | Wt 174.0 lb

## 2010-12-02 DIAGNOSIS — M25559 Pain in unspecified hip: Secondary | ICD-10-CM

## 2010-12-02 DIAGNOSIS — J984 Other disorders of lung: Secondary | ICD-10-CM

## 2010-12-02 DIAGNOSIS — I10 Essential (primary) hypertension: Secondary | ICD-10-CM

## 2010-12-02 DIAGNOSIS — M545 Low back pain, unspecified: Secondary | ICD-10-CM

## 2010-12-02 DIAGNOSIS — K219 Gastro-esophageal reflux disease without esophagitis: Secondary | ICD-10-CM

## 2010-12-02 DIAGNOSIS — M79604 Pain in right leg: Secondary | ICD-10-CM

## 2010-12-02 MED ORDER — DICLOFENAC SODIUM 75 MG PO TBEC: 75.0000 mg | DELAYED_RELEASE_TABLET | Freq: Two times a day (BID) | ORAL | Status: DC

## 2010-12-02 NOTE — Assessment & Plan Note (Signed)
CT scan per pulmonary nodule followup scheduled this month.

## 2010-12-02 NOTE — Progress Notes (Signed)
Subjective:    Patient ID: [name] [name], female    DOB: 1924-10-05, 77 y.o.   MRN: [id number]  HPI [name] [name] is a pleasant 77 year old female who presents for followup of hypertension hyperlipidemia gastroesophageal reflux and chronic back pain.  She was referred to physical therapy which has helped somewhat and mobility and she has started an exercise program again she states that she does believe that she is improving slowly but still has pain on a daily basis.  She was given Mobic which he states does not affect the pain at all and she resumed taking Aleve over-the-counter as needed. She is a former smoker   Review of Systems  Constitutional: Negative for activity change, appetite change and fatigue.  HENT: Negative for ear pain, congestion, neck pain, postnasal drip and sinus pressure.   Eyes: Negative for redness and visual disturbance.  Respiratory: Negative for cough, shortness of breath and wheezing.   Gastrointestinal: Negative for abdominal pain and abdominal distention.  Genitourinary: Negative for dysuria, frequency and menstrual problem.  Musculoskeletal: Negative for myalgias, joint swelling and arthralgias.  Skin: Negative for rash and wound.  Neurological: Negative for dizziness, weakness and headaches.  Hematological: Negative for adenopathy. Does not bruise/bleed easily.  Psychiatric/Behavioral: Negative for sleep disturbance and decreased concentration.   Past Medical History  Diagnosis Date  . GERD (gastroesophageal reflux disease)   . Hyperlipidemia   . Hypertension   . Stenosis of popliteal artery   . History of colonic polyps   . Diverticulosis   . Shingles   . Eczema   . Allergic rhinoconjunctivitis   . Lung nodule 2011   Past Surgical History  Procedure Date  . Femoral-popliteal bypass graft   . Tubal ligation   . Tonsilectomy, adenoidectomy, bilateral myringotomy and tubes   . Cholecystectomy 1998    reports that she quit smoking about  17 years ago. She does not have any smokeless tobacco history on file. She reports that she does not drink alcohol or use illicit drugs. family history includes Arthritis in her mother; Breast cancer in her sister; Colon cancer in her sister; Colon polyps in her father; Heart disease in her father; and Lung cancer in her brother and sister. Allergies  Allergen Reactions  . Latex     REACTION: rash  . Sulfamethoxazole     REACTION: disorientation        Objective:   Physical Exam  Nursing note and vitals reviewed. Constitutional: She is oriented to person, place, and time. She appears well-developed and well-nourished. No distress.  HENT:  Head: Normocephalic and atraumatic.  Right Ear: External ear normal.  Left Ear: External ear normal.  Nose: Nose normal.  Mouth/Throat: Oropharynx is clear and moist.  Eyes: Conjunctivae and EOM are normal. Pupils are equal, round, and reactive to light.  Neck: Normal range of motion. Neck supple. No JVD present. No tracheal deviation present. No thyromegaly present.  Cardiovascular: Normal rate, regular rhythm, normal heart sounds and intact distal pulses.   No murmur heard. Pulmonary/Chest: Effort normal and breath sounds normal. She has no wheezes. She exhibits no tenderness.  Abdominal: Soft. Bowel sounds are normal.  Musculoskeletal: Normal range of motion. She exhibits edema and tenderness.       Chronic low back spasm  Lymphadenopathy:    She has no cervical adenopathy.  Neurological: She is alert and oriented to person, place, and time. She has normal reflexes. No cranial nerve deficit.  Skin: Skin is warm and dry. She is  not diaphoretic.  Psychiatric: She has a normal mood and affect. Her behavior is normal.          Assessment & Plan:  Persistent low back pain with known pathology.  Continue home physical therapy change anti-inflammatory therapy to full tear and 75 mg by mouth twice a day monitor renal function at next office  visit if any signs of GI bleeding ARC stools or lightheadedness occur patient is to stop the medication notify our office. Blood pressure stable at this time gastroesophageal reflux is stable but I'm concerned about worsening or esophageal reflux with a nonsteroidal use she'll monitor this closely and report back to [place]. A concomitant PPI may be needed to ensure safety with nonsteroidal therapy

## 2010-12-03 ENCOUNTER — Ambulatory Visit (INDEPENDENT_AMBULATORY_CARE_PROVIDER_SITE_OTHER)
Admission: RE | Admit: 2010-12-03 | Discharge: 2010-12-03 | Disposition: A | Discharge status: Home / Self Care | Payer: Medicare Other | Source: Ambulatory Visit | Attending: Critical Care Medicine | Admitting: Critical Care Medicine

## 2010-12-03 ENCOUNTER — Ambulatory Visit (INDEPENDENT_AMBULATORY_CARE_PROVIDER_SITE_OTHER): Payer: Medicare Other

## 2010-12-03 DIAGNOSIS — J984 Other disorders of lung: Secondary | ICD-10-CM

## 2010-12-03 DIAGNOSIS — J309 Allergic rhinitis, unspecified: Secondary | ICD-10-CM

## 2010-12-07 NOTE — Progress Notes (Signed)
Quick Note:  Called, spoke with pt. She is aware nodule unchanged per PW and he recs another CT in 12 months. She verbalized understanding of these results and recs and voiced no further questions/concerns at this time. ______

## 2010-12-10 ENCOUNTER — Ambulatory Visit (INDEPENDENT_AMBULATORY_CARE_PROVIDER_SITE_OTHER): Payer: Medicare Other

## 2010-12-10 DIAGNOSIS — J309 Allergic rhinitis, unspecified: Secondary | ICD-10-CM

## 2010-12-13 ENCOUNTER — Ambulatory Visit: Payer: Medicare Other | Admitting: Internal Medicine

## 2010-12-17 ENCOUNTER — Ambulatory Visit (INDEPENDENT_AMBULATORY_CARE_PROVIDER_SITE_OTHER): Payer: Medicare Other

## 2010-12-17 DIAGNOSIS — J309 Allergic rhinitis, unspecified: Secondary | ICD-10-CM

## 2010-12-17 NOTE — Procedures (Unsigned)
BYPASS GRAFT EVALUATION  INDICATION:  Follow up right lower extremity bypass graft.  HISTORY: Diabetes:  No. Cardiac:  No. Hypertension:  Yes. Smoking:  Previous. Previous Surgery:  Bilateral fem-pop bypass grafts in 1996, thrombolysis angioplasty of the right fem-pop bypass graft on 11/15/2008, thrombectomy/angioplasty with interpositional graft from above-knee to below-knee popliteal artery on 04/07/2009.  SINGLE LEVEL ARTERIAL EXAM                              RIGHT              LEFT Brachial:                    119                123 Anterior tibial:             122                129 Posterior tibial:            124                124 Peroneal: Ankle/brachial index:        1.01               1.05  PREVIOUS ABI:  Date: 05/06/2010  RIGHT:  1.0  LEFT:  0.98  LOWER EXTREMITY BYPASS GRAFT DUPLEX EXAM:  DUPLEX:  Biphasic Doppler waveforms noted throughout the right lower extremity bypass graft with no increased velocities.  IMPRESSION: 1. Patent right femoropopliteal bypass graft with no evidence of     stenosis. 2. The bilateral ankle brachial indices suggest normal perfusion of     the bilateral lower extremities.  Bilateral ankle brachial indices     are stable when compared to the previous examination.  ___________________________________________ V. Charlena Cross, MD  CH/MEDQ  D:  12/01/2010  T:  12/01/2010  Job:  [id number]

## 2010-12-24 ENCOUNTER — Ambulatory Visit (INDEPENDENT_AMBULATORY_CARE_PROVIDER_SITE_OTHER): Payer: Medicare Other

## 2010-12-24 DIAGNOSIS — J309 Allergic rhinitis, unspecified: Secondary | ICD-10-CM

## 2010-12-28 ENCOUNTER — Other Ambulatory Visit: Payer: Self-pay | Admitting: Internal Medicine

## 2010-12-28 ENCOUNTER — Ambulatory Visit (INDEPENDENT_AMBULATORY_CARE_PROVIDER_SITE_OTHER): Payer: Medicare Other | Admitting: Family Medicine

## 2010-12-28 ENCOUNTER — Encounter: Payer: Self-pay | Admitting: Family Medicine

## 2010-12-28 VITALS — BP 150/70 | Temp 98.4°F | Wt 172.0 lb

## 2010-12-28 DIAGNOSIS — R195 Other fecal abnormalities: Secondary | ICD-10-CM

## 2010-12-28 DIAGNOSIS — K921 Melena: Secondary | ICD-10-CM

## 2010-12-28 LAB — CBC WITH DIFFERENTIAL/PLATELET
Basophils Absolute: 0 10*3/uL (ref 0.0–0.1)
Basophils Relative: 0.5 % (ref 0.0–3.0)
Eosinophils Absolute: 0.1 10*3/uL (ref 0.0–0.7)
Hemoglobin: 12.5 g/dL (ref 12.0–15.0)
Lymphocytes Relative: 22.6 % (ref 12.0–46.0)
Lymphs Abs: 2.1 10*3/uL (ref 0.7–4.0)
MCHC: 32.7 g/dL (ref 30.0–36.0)
MCV: 87.4 fl (ref 78.0–100.0)
Monocytes Absolute: 0.5 10*3/uL (ref 0.1–1.0)
Neutro Abs: 6.5 10*3/uL (ref 1.4–7.7)
RBC: 4.38 Mil/uL (ref 3.87–5.11)
RDW: 13.1 % (ref 11.5–14.6)

## 2010-12-28 NOTE — Telephone Encounter (Signed)
Pt leaves a message on Triage that she has abd pain that sounds lie PUD pain, and tarry, black stools x 2 days.  Is not home, and asked that we leave a message.  Left message to to to the ER ASAP and explained the reasons why.  Discussed with Dr. Cato Mulligan and agreed with plan of action. Will wait to see if pt call back today .  Hopefully she will go to the ER when she returns from her appointment.

## 2010-12-28 NOTE — Telephone Encounter (Signed)
Pt called back. She wants to know if she really needs to go to the ER, or if she can just get in this week to see someone. Should she stop the Voltaren in the mean time? Thanks.

## 2010-12-28 NOTE — Patient Instructions (Signed)
Do not take any further Diclofenac. Hold aspirin for the next few days Continue with Prilosec. Follow up immediately for any dizziness, abdominal pain, or other concerning symptoms.

## 2010-12-28 NOTE — Telephone Encounter (Signed)
Pt was seen in office today

## 2010-12-28 NOTE — Progress Notes (Signed)
  Subjective:    Patient ID: [name] [name], female    DOB: 1925/04/18, 77 y.o.   MRN: [id number]  HPI Patient seen and describes possible melena with onset Friday - 4 days ago. Over the weekend stools have become more brownish in color. She was placed on diclofenac about 2 weeks ago also takes aspirin. Denies epigastric pain. No dizziness. No nausea or vomiting. No hematemesis. Stools normal in frequency. No history of known peptic disease. Takes Prilosec 20 mg daily  Past Medical History  Diagnosis Date  . GERD (gastroesophageal reflux disease)   . Hyperlipidemia   . Hypertension   . Stenosis of popliteal artery   . History of colonic polyps   . Diverticulosis   . Shingles   . Eczema   . Allergic rhinoconjunctivitis   . Lung nodule 2011   Past Surgical History  Procedure Date  . Femoral-popliteal bypass graft   . Tubal ligation   . Tonsilectomy, adenoidectomy, bilateral myringotomy and tubes   . Cholecystectomy 1998    reports that she quit smoking about 17 years ago. She does not have any smokeless tobacco history on file. She reports that she does not drink alcohol or use illicit drugs. family history includes Arthritis in her mother; Breast cancer in her sister; Colon cancer in her sister; Colon polyps in her father; Heart disease in her father; and Lung cancer in her brother and sister. Allergies  Allergen Reactions  . Latex     REACTION: rash  . Sulfamethoxazole     REACTION: disorientation      Review of Systems  Constitutional: Negative for fever, chills and fatigue.  Respiratory: Negative for shortness of breath.   Cardiovascular: Negative for chest pain.  Gastrointestinal: Negative for nausea, vomiting, abdominal pain, diarrhea, constipation and abdominal distention.  Neurological: Negative for dizziness, syncope and headaches.       Objective:   Physical Exam  Constitutional: She appears well-developed and well-nourished. No distress.  Cardiovascular:  Normal rate, regular rhythm and normal heart sounds.   Pulmonary/Chest: Effort normal and breath sounds normal. No respiratory distress. She has no wheezes. She has no rales.  Abdominal: Soft. Bowel sounds are normal. She exhibits no distension and no mass. There is no tenderness. There is no rebound and no guarding.  Genitourinary:       Rectal reveals dark brown stool. Hemoccult negative          Assessment & Plan:  Black stool. By description, concern for possible melena. Is actually Hemoccult negative today. No evidence for active bleed with no orthostasis, tachycardia, heme neg, etc.  Discontinue diclofenac. Hold aspirin for the next few days. Continue prilosec. Check CBC. Follow up promptly for any recurrent symptoms,dizziness, or abdominal pain.

## 2010-12-29 NOTE — Progress Notes (Signed)
Quick Note:  Pt informed on home VM ______ 

## 2011-01-03 ENCOUNTER — Encounter: Payer: Self-pay | Admitting: Family Medicine

## 2011-01-03 ENCOUNTER — Ambulatory Visit (INDEPENDENT_AMBULATORY_CARE_PROVIDER_SITE_OTHER): Payer: Medicare Other | Admitting: Family Medicine

## 2011-01-03 VITALS — BP 130/58 | Temp 98.1°F | Wt 174.0 lb

## 2011-01-03 DIAGNOSIS — R195 Other fecal abnormalities: Secondary | ICD-10-CM

## 2011-01-03 DIAGNOSIS — M47817 Spondylosis without myelopathy or radiculopathy, lumbosacral region: Secondary | ICD-10-CM

## 2011-01-03 DIAGNOSIS — K921 Melena: Secondary | ICD-10-CM

## 2011-01-03 DIAGNOSIS — M47816 Spondylosis without myelopathy or radiculopathy, lumbar region: Secondary | ICD-10-CM

## 2011-01-03 MED ORDER — TRAMADOL HCL 50 MG PO TBDP: 1.0000 | ORAL_TABLET | Freq: Four times a day (QID) | ORAL | Status: DC | PRN

## 2011-01-03 NOTE — Progress Notes (Signed)
  Subjective:    Patient ID: [name] [name], female    DOB: 01-27-1925, 77 y.o.   MRN: [id number]  HPI Patient seen as a work in with some persistent low back pain. Had plain x-rays back in May which confirmed lumbar spondylosis. Her pain is diffuse lower lumbar. No recent injury. Worse with walking and improved with sitting and rest. 9/10 severity at times. Deep achy discomfort. Radiates toward both buttocks and occasionally toward the hip. Recently took diclofenac but had black tarry stools though Hemoccult negative here. Patient had recent physical therapy without much improvement., All without improvement. No incontinence. Denies recent appetite or weight changes. No dysuria.   Does have hx of reported fem-popliteal bypass but no claudication symptoms.  No further black stools since last visit. Her hemoglobin was stable and Hemoccult negative. She is off diclofenac at this time  Past Medical History  Diagnosis Date  . GERD (gastroesophageal reflux disease)   . Hyperlipidemia   . Hypertension   . Stenosis of popliteal artery   . History of colonic polyps   . Diverticulosis   . Shingles   . Eczema   . Allergic rhinoconjunctivitis   . Lung nodule 2011   Past Surgical History  Procedure Date  . Femoral-popliteal bypass graft   . Tubal ligation   . Tonsilectomy, adenoidectomy, bilateral myringotomy and tubes   . Cholecystectomy 1998    reports that she quit smoking about 17 years ago. She does not have any smokeless tobacco history on file. She reports that she does not drink alcohol or use illicit drugs. family history includes Arthritis in her mother; Breast cancer in her sister; Colon cancer in her sister; Colon polyps in her father; Heart disease in her father; and Lung cancer in her brother and sister. Allergies  Allergen Reactions  . Latex     REACTION: rash  . Sulfamethoxazole     REACTION: disorientation      Review of Systems  Constitutional: Negative for chills,  appetite change, fatigue and unexpected weight change.  Respiratory: Negative for cough and shortness of breath.   Cardiovascular: Negative for chest pain and leg swelling.  Gastrointestinal: Negative for abdominal pain and blood in stool.  Genitourinary: Negative for dysuria.  Musculoskeletal: Positive for back pain. Negative for joint swelling.  Skin: Negative for rash.  Neurological: Negative for weakness.       Objective:   Physical Exam  Constitutional: She appears well-developed and well-nourished.  Cardiovascular: Normal rate and regular rhythm.   Pulmonary/Chest: Effort normal and breath sounds normal. No respiratory distress. She has no wheezes. She has no rales.  Musculoskeletal: She exhibits no edema.       Straight leg raise is negative bilaterally  Neurological:       Symmetric deep tendon reflexes lower extremities. No strength deficits. Gait normal          Assessment & Plan:  #1 persistent low back pain. Known lumbar spondylosis and suspect spinal stenosis based on history. Trial of tramadol 50 mg one to 2 tablets every 6 hours as needed. Consider MRI if progressing #2 recent black stool resolved off diclofenac.

## 2011-01-03 NOTE — Patient Instructions (Signed)
Follow up with Dr Lovell Sheehan for any progressive back pain or any weakness or other new symptoms.

## 2011-01-07 ENCOUNTER — Ambulatory Visit (INDEPENDENT_AMBULATORY_CARE_PROVIDER_SITE_OTHER): Payer: Medicare Other

## 2011-01-07 DIAGNOSIS — J309 Allergic rhinitis, unspecified: Secondary | ICD-10-CM

## 2011-01-10 ENCOUNTER — Ambulatory Visit (INDEPENDENT_AMBULATORY_CARE_PROVIDER_SITE_OTHER): Payer: Medicare Other | Admitting: Family Medicine

## 2011-01-10 ENCOUNTER — Encounter: Payer: Self-pay | Admitting: Family Medicine

## 2011-01-10 VITALS — BP 120/66 | Temp 98.3°F | Wt 173.0 lb

## 2011-01-10 DIAGNOSIS — M47816 Spondylosis without myelopathy or radiculopathy, lumbar region: Secondary | ICD-10-CM

## 2011-01-10 DIAGNOSIS — M47817 Spondylosis without myelopathy or radiculopathy, lumbosacral region: Secondary | ICD-10-CM

## 2011-01-10 NOTE — Progress Notes (Signed)
  Subjective:    Patient ID: [name] [name], female    DOB: 21-Feb-1925, 77 y.o.   MRN: [id number]  HPI Patient comes back in today regarding back pain. Tramadol has helped. 9/10 pain reduced to 5-6/10. No side effects other than mild sedation. She continues to have lower lumbar pain with bilateral radiculopathy symptoms mostly to buttock. Worse with standing and walking and relieved at rest. No claudication symptoms. No urine incontinence. No lower extremity weakness.   Review of Systems  Constitutional: Negative for fever, chills, appetite change and unexpected weight change.  Gastrointestinal: Negative for abdominal pain.  Genitourinary: Negative for dysuria.  Neurological: Negative for weakness.  Hematological: Negative for adenopathy.       Objective:   Physical Exam  Constitutional: She appears well-developed and well-nourished. No distress.  Cardiovascular: Normal rate and regular rhythm.   Pulmonary/Chest: Effort normal and breath sounds normal. No respiratory distress. She has no wheezes. She has no rales.  Musculoskeletal: She exhibits no edema.       Straight leg raises remain negative. Good distal foot pulses  Neurological:       No lower extremity weakness. Symmetric reflexes          Assessment & Plan:  Bilateral lumbar pain with bilateral radiculopathy symptoms. Known lumbar spondylosis. Pain slightly improved with Ultram. Schedule MRI without contrast to further assess.

## 2011-01-14 ENCOUNTER — Ambulatory Visit (INDEPENDENT_AMBULATORY_CARE_PROVIDER_SITE_OTHER): Payer: Medicare Other

## 2011-01-14 ENCOUNTER — Telehealth: Payer: Self-pay | Admitting: *Deleted

## 2011-01-14 DIAGNOSIS — J309 Allergic rhinitis, unspecified: Secondary | ICD-10-CM

## 2011-01-14 NOTE — Telephone Encounter (Signed)
Lorazepam 1 mg. To [facility], and pt notified.

## 2011-01-14 NOTE — Telephone Encounter (Signed)
Lorazepam 1 mg 1 hour prior to procedure.  Make sure she has someone to drive.

## 2011-01-14 NOTE — Telephone Encounter (Signed)
Pt is asking for a sedative to use during her MRI next week.

## 2011-01-18 ENCOUNTER — Ambulatory Visit
Admission: RE | Admit: 2011-01-18 | Discharge: 2011-01-18 | Disposition: A | Discharge status: Home / Self Care | Payer: Medicare Other | Source: Ambulatory Visit | Attending: Family Medicine | Admitting: Family Medicine

## 2011-01-18 DIAGNOSIS — M47816 Spondylosis without myelopathy or radiculopathy, lumbar region: Secondary | ICD-10-CM

## 2011-01-19 ENCOUNTER — Other Ambulatory Visit: Payer: Self-pay | Admitting: Internal Medicine

## 2011-01-19 ENCOUNTER — Telehealth: Payer: Self-pay | Admitting: Internal Medicine

## 2011-01-19 DIAGNOSIS — IMO0002 Reserved for concepts with insufficient information to code with codable children: Secondary | ICD-10-CM

## 2011-01-19 NOTE — Progress Notes (Signed)
Quick Note:  Message left on confidential VM ______

## 2011-01-19 NOTE — Telephone Encounter (Signed)
Pt req to get a referral to Neurologist, due to results from MRI yesterday.

## 2011-01-19 NOTE — Telephone Encounter (Signed)
Per note from dr burchette that ordered mri- to neurosurgeon- referral sent to [facility] for [facility]-pt informed

## 2011-01-21 ENCOUNTER — Ambulatory Visit (INDEPENDENT_AMBULATORY_CARE_PROVIDER_SITE_OTHER): Payer: Medicare Other

## 2011-01-21 DIAGNOSIS — J309 Allergic rhinitis, unspecified: Secondary | ICD-10-CM

## 2011-01-28 ENCOUNTER — Ambulatory Visit (INDEPENDENT_AMBULATORY_CARE_PROVIDER_SITE_OTHER): Payer: Medicare Other

## 2011-01-28 DIAGNOSIS — J309 Allergic rhinitis, unspecified: Secondary | ICD-10-CM

## 2011-01-31 ENCOUNTER — Ambulatory Visit (INDEPENDENT_AMBULATORY_CARE_PROVIDER_SITE_OTHER): Payer: Medicare Other | Admitting: Internal Medicine

## 2011-01-31 ENCOUNTER — Encounter: Payer: Self-pay | Admitting: Internal Medicine

## 2011-01-31 VITALS — BP 142/82 | HR 104 | Ht 62.0 in | Wt 171.8 lb

## 2011-01-31 DIAGNOSIS — J984 Other disorders of lung: Secondary | ICD-10-CM

## 2011-01-31 DIAGNOSIS — J301 Allergic rhinitis due to pollen: Secondary | ICD-10-CM

## 2011-01-31 DIAGNOSIS — H1045 Other chronic allergic conjunctivitis: Secondary | ICD-10-CM

## 2011-01-31 NOTE — Progress Notes (Signed)
Patient ID: [name] [name], female    DOB: 12/15/1924, 77 y.o.   MRN: [id number]  HPI 08/02/10-85 yoF former smoker, followed here for allergic rhinitis on allergy vaccine, and COPD. She is bothered on chronic basis by her watery, burning eyes. She just saw Dr Dione Booze, with all his drops tried. She is worse in past 1-2 months with Spring pollen season. She is resigned to staying this way. Seeing Dr Terri Piedra for significant eczema of fingertips.  She remains satisfied that the allergy vaccine is helpful and worth continuing.  01/31/11-  86 yoF former smoker, followed here for allergic rhinitis on allergy vaccine, and COPD, lung nodules. She is bothered on chronic basis by her watery, burning eyes. Acute visit-ill for past 2 days. Complains of pressure in her ears, weakness and shakiness, headache, nausea. Eyes always water when she goes outdoors. She denies fever, swollen glands, sore throat, discolored sputum, chest tightness or wheeze. She has been on tramadol for several weeks and asks if that might be what started causing her to feel badly just a few days ago. CT chest 12/07/10-  IMPRESSION:  1. Compared with previous exam the previously referenced pulmonary  nodules in the right middle lobe have not changed in size.  Recommend continued follow-up to two years from initial study  01/15/2010.  Original Report Authenticated By: Rosealee Albee, M.D.      Review of Systems See HPI Constitutional:   No-   weight loss, night sweats, fevers, chills,+ fatigue, lassitude. HEENT:   No-  headaches, difficulty swallowing, tooth/dental problems, sore throat,       No-  sneezing, itching, ear ache, nasal congestion, post nasal drip,  CV:  No-   chest pain, orthopnea, PND, swelling in lower extremities, anasarca, dizziness, palpitations Resp: No-   shortness of breath with exertion or at rest.              No-   productive cough,  No non-productive cough,  No-  coughing up of blood.              No-    change in color of mucus.  No- wheezing.   Skin: No-   rash or lesions. GI:  No-   heartburn, indigestion, abdominal pain, + nausea,No-vomiting, diarrhea,                 change in bowel habits, loss of appetite GU: No-   dysuria, change in color of urine, no urgency or frequency.  No- flank pain. MS:  No-   joint pain or swelling.  No- decreased range of motion.  No- back pain. Neuro- grossly normal to observation, Or:  Psych:  No- change in mood or affect. No depression or anxiety.  No memory loss.      Objective:   Physical Exam General- Alert, Oriented, Affect-appropriate, Distress- none acute Skin- rash-none, lesions- none, excoriation- none Lymphadenopathy- none Head- atraumatic            Eyes- Gross vision intact, PERRLA, conjunctivae clear secretions            Ears- Hearing, canals-normal            Nose- Clear, no-Septal dev, mucus, polyps, erosion, perforation             Throat- Mallampati II , mucosa clear , drainage- none, tonsils- atrophic Neck- flexible , trachea midline, no stridor , thyroid nl, carotid no bruit Chest - symmetrical excursion , unlabored  Heart/CV- RRR , no murmur , no gallop  , no rub, nl s1 s2                           - JVD- none , edema- none, stasis changes- none, varices- none           Lung- clear to P&A, wheeze- none, cough- none , dullness-none, rub- none           Chest wall-  Abd- tender-no, distended-no, bowel sounds-present, HSM- no Br/ Gen/ Rectal- Not done, not indicated Extrem- cyanosis- none, clubbing, none, atrophy- none, strength- nl Neuro- grossly intact to observation; mildly tremulous

## 2011-01-31 NOTE — Patient Instructions (Addendum)
Treat your current discomfort as a viral infection, with rest, fluids and avoid getting chilled or overly tired. It should clear in a week or so. Please call as needed.

## 2011-02-01 NOTE — Assessment & Plan Note (Signed)
Sample Pataday eyedrops. Suggest sunglasses when outdoors

## 2011-02-01 NOTE — Assessment & Plan Note (Signed)
Asymptomatic nodules appear stable in this former smoker. Need followup CT September 2013

## 2011-02-01 NOTE — Assessment & Plan Note (Addendum)
She feels comfortably controlled without problems except per her chronic watery eyes. Her current acute complaints appear to be a viral syndrome. I suggested fluid, rest and supportive care as discussed.

## 2011-02-04 ENCOUNTER — Ambulatory Visit (INDEPENDENT_AMBULATORY_CARE_PROVIDER_SITE_OTHER): Payer: Medicare Other

## 2011-02-04 DIAGNOSIS — J309 Allergic rhinitis, unspecified: Secondary | ICD-10-CM

## 2011-02-07 ENCOUNTER — Ambulatory Visit (INDEPENDENT_AMBULATORY_CARE_PROVIDER_SITE_OTHER): Payer: Medicare Other | Admitting: Internal Medicine

## 2011-02-07 ENCOUNTER — Encounter: Payer: Self-pay | Admitting: Internal Medicine

## 2011-02-07 VITALS — BP 154/90 | HR 80 | Temp 98.2°F | Resp 16 | Ht 62.0 in | Wt 167.0 lb

## 2011-02-07 DIAGNOSIS — F411 Generalized anxiety disorder: Secondary | ICD-10-CM

## 2011-02-07 DIAGNOSIS — M545 Low back pain, unspecified: Secondary | ICD-10-CM

## 2011-02-07 DIAGNOSIS — I739 Peripheral vascular disease, unspecified: Secondary | ICD-10-CM

## 2011-02-07 DIAGNOSIS — M79604 Pain in right leg: Secondary | ICD-10-CM

## 2011-02-07 DIAGNOSIS — J4489 Other specified chronic obstructive pulmonary disease: Secondary | ICD-10-CM

## 2011-02-07 DIAGNOSIS — B0229 Other postherpetic nervous system involvement: Secondary | ICD-10-CM

## 2011-02-07 DIAGNOSIS — J449 Chronic obstructive pulmonary disease, unspecified: Secondary | ICD-10-CM

## 2011-02-07 MED ORDER — MORPHINE SULFATE ER BEADS 90 MG PO CP24: 90.0000 mg | ORAL_CAPSULE | Freq: Every day | ORAL | Status: DC

## 2011-02-07 NOTE — Patient Instructions (Signed)
Stop the tramadol Hopefully the tremors are a side effect of the tramadol and will cease when he stopped the tramadol medications.  For pain we are going to use a long-acting morphine

## 2011-02-11 ENCOUNTER — Ambulatory Visit (INDEPENDENT_AMBULATORY_CARE_PROVIDER_SITE_OTHER): Payer: Medicare Other

## 2011-02-11 DIAGNOSIS — J309 Allergic rhinitis, unspecified: Secondary | ICD-10-CM

## 2011-02-14 ENCOUNTER — Telehealth: Payer: Self-pay | Admitting: Family Medicine

## 2011-02-14 ENCOUNTER — Encounter: Payer: Self-pay | Admitting: Internal Medicine

## 2011-02-14 NOTE — Telephone Encounter (Signed)
Pt is taking Avinza 90 mg and she took it on Monday, Tues, Wed.  She quit taking it because she was having some emotional issues but today was the 1st day she could eat and feels "normal".  Her back pain is coming back and she was told by Dr Delma Officer that she needs surgery.  She has 3 wks to make a decision and would like to be worked in with Dr Lovell Sheehan on Friday to discuss the surgery and the Avinza.  I told pt to stay off the Avinza and I would have [name] call her back tomorrow to discuss getting her worked.  Pt was ok with that.

## 2011-02-14 NOTE — Telephone Encounter (Signed)
Pt states Dr. Lovell Sheehan gave her a new med last wk - did not state name of Rx. She took 2 pills, and says that it isn't working, and that it gives her "crazy thoughts" and is weird. She has stopped taking it. Please call ASAP to advise.  Call at # given or [phone].

## 2011-02-15 NOTE — Telephone Encounter (Signed)
Left message on machine appt with dr Lovell Sheehan - 10-26

## 2011-02-18 ENCOUNTER — Encounter: Payer: Medicare Other | Admitting: Internal Medicine

## 2011-02-18 ENCOUNTER — Ambulatory Visit (INDEPENDENT_AMBULATORY_CARE_PROVIDER_SITE_OTHER): Payer: Medicare Other

## 2011-02-18 ENCOUNTER — Encounter: Payer: Self-pay | Admitting: Internal Medicine

## 2011-02-18 ENCOUNTER — Encounter (INDEPENDENT_AMBULATORY_CARE_PROVIDER_SITE_OTHER): Payer: Medicare Other | Admitting: Internal Medicine

## 2011-02-18 VITALS — BP 140/70 | HR 76 | Temp 98.2°F | Resp 16 | Ht 62.0 in | Wt 167.0 lb

## 2011-02-18 DIAGNOSIS — M545 Low back pain, unspecified: Secondary | ICD-10-CM

## 2011-02-18 DIAGNOSIS — M79604 Pain in right leg: Secondary | ICD-10-CM

## 2011-02-18 DIAGNOSIS — J309 Allergic rhinitis, unspecified: Secondary | ICD-10-CM

## 2011-02-18 MED ORDER — OXYCODONE-ACETAMINOPHEN 5-325 MG PO TABS: 1.0000 | ORAL_TABLET | Freq: Four times a day (QID) | ORAL | Status: DC | PRN

## 2011-02-18 MED ORDER — NAPROXEN 500 MG PO TABS: 500.0000 mg | ORAL_TABLET | Freq: Two times a day (BID) | ORAL | Status: DC

## 2011-02-18 MED ORDER — CARBAMAZEPINE ER 100 MG PO CP12: 100.0000 mg | ORAL_CAPSULE | Freq: Two times a day (BID) | ORAL | Status: DC

## 2011-02-18 NOTE — Patient Instructions (Addendum)
The patient is instructed to continue all medications as prescribed. Schedule followup with check out clerk upon leaving the clinic   1. If I do not have the surgery well I rapidly lose mobility and if I do have surgery or will it give me a reasonable chance to preserve my mobility. 2. if I have the surgery is there a reasonable chance that my pain will be controlled without medication. 3. if I have this surgery is a reasonable chance that it will not create another complication resulting in  4. If I was sure mother would you recommend a surgery to her (assuming that he loved his mother)

## 2011-02-25 ENCOUNTER — Ambulatory Visit (INDEPENDENT_AMBULATORY_CARE_PROVIDER_SITE_OTHER): Payer: Medicare Other

## 2011-02-25 DIAGNOSIS — J309 Allergic rhinitis, unspecified: Secondary | ICD-10-CM

## 2011-03-04 ENCOUNTER — Ambulatory Visit (INDEPENDENT_AMBULATORY_CARE_PROVIDER_SITE_OTHER): Payer: Medicare Other

## 2011-03-04 DIAGNOSIS — J309 Allergic rhinitis, unspecified: Secondary | ICD-10-CM

## 2011-03-10 ENCOUNTER — Encounter: Payer: Self-pay | Admitting: Internal Medicine

## 2011-03-10 ENCOUNTER — Ambulatory Visit (INDEPENDENT_AMBULATORY_CARE_PROVIDER_SITE_OTHER): Payer: Medicare Other | Admitting: Internal Medicine

## 2011-03-10 VITALS — BP 140/80 | HR 72 | Temp 98.6°F | Resp 16 | Ht 62.0 in | Wt 172.0 lb

## 2011-03-10 DIAGNOSIS — M549 Dorsalgia, unspecified: Secondary | ICD-10-CM

## 2011-03-10 DIAGNOSIS — G8929 Other chronic pain: Secondary | ICD-10-CM

## 2011-03-10 NOTE — Progress Notes (Signed)
Subjective:    Patient ID: [name] [name], female    DOB: [birth year], 77 y.o.   MRN: [id number]  HPI  The patient has chronic back pain and is now in a protocol with combination of Roxicet for severe pain and percent twice daily and she has stable control of her pain with this  Her blood pressures not elevated on this she has no side effects from these medications at this time I believe that this would be an appropriate long-term therapy for her as long as we can keep the use consistent  Review of Systems  Constitutional: Negative for activity change, appetite change and fatigue.  HENT: Negative for ear pain, congestion, neck pain, postnasal drip and sinus pressure.   Eyes: Negative for redness and visual disturbance.  Respiratory: Negative for cough, shortness of breath and wheezing.   Gastrointestinal: Negative for abdominal pain and abdominal distention.  Genitourinary: Negative for dysuria, frequency and menstrual problem.  Musculoskeletal: Positive for back pain, joint swelling and gait problem. Negative for myalgias and arthralgias.  Skin: Negative for rash and wound.  Neurological: Negative for dizziness, weakness and headaches.  Hematological: Negative for adenopathy. Does not bruise/bleed easily.  Psychiatric/Behavioral: Negative for sleep disturbance and decreased concentration.   Past Medical History  Diagnosis Date  . GERD (gastroesophageal reflux disease)   . Hyperlipidemia   . Hypertension   . Stenosis of popliteal artery   . History of colonic polyps   . Diverticulosis   . Shingles   . Eczema   . Allergic rhinoconjunctivitis   . Lung nodule 2011    History   Social History  . Marital Status: Widowed    Spouse Name: N/A    Number of Children: N/A  . Years of Education: N/A   Occupational History  . Not on file.   Social History Main Topics  . Smoking status: Former Smoker -- 2.0 packs/day    Quit date: 06/18/1993  . Smokeless tobacco: Not on file   Comment: QUIT IN 1995  . Alcohol Use: No  . Drug Use: No  . Sexually Active: Not Currently   Other Topics Concern  . Not on file   Social History Narrative  . No narrative on file    Past Surgical History  Procedure Date  . Femoral-popliteal bypass graft   . Tubal ligation   . Tonsilectomy, adenoidectomy, bilateral myringotomy and tubes   . Cholecystectomy 1998    Family History  Problem Relation Age of Onset  . Arthritis Mother   . Heart disease Father   . Colon polyps Father   . Breast cancer Sister   . Lung cancer Sister   . Colon cancer Sister   . Lung cancer Brother     Allergies  Allergen Reactions  . Latex     REACTION: rash  . Sulfamethoxazole     REACTION: disorientation    Current Outpatient Prescriptions on File Prior to Visit  Medication Sig Dispense Refill  . aspirin 81 MG tablet Take 81 mg by mouth daily.        . calcium-vitamin D (OSCAL WITH D 500-200) 500-200 MG-UNIT per tablet Take 1 tablet by mouth daily.        . carbamazepine (CARBATROL) 100 MG 12 hr capsule Take 1 capsule (100 mg total) by mouth 2 (two) times daily. 1 at night but may take a second one in the morning for pain  60 capsule  3  . Cholecalciferol (VITAMIN D3) 1000 UNITS CAPS Take  1,000 Units by mouth daily.        Marland Kitchen DIOVAN HCT 160-25 MG per tablet TAKE ONE TABLET BY MOUTH DAILY  90 tablet  3  . ipratropium (ATROVENT) 0.06 % nasal spray 1-2 sprays in each nostril three times a day if needed  15 mL  0  . Multiple Vitamin (MULTIVITAMIN) tablet Take 1 tablet by mouth daily.        . naproxen (NAPROSYN) 500 MG tablet Take 1 tablet (500 mg total) by mouth 2 (two) times daily with a meal.  180 tablet  3  . omeprazole (PRILOSEC) 20 MG capsule Take 20 mg by mouth daily.        Marland Kitchen oxyCODONE-acetaminophen (ROXICET) 5-325 MG per tablet Take 1 tablet by mouth every 6 (six) hours as needed for pain.  30 tablet  0  . simvastatin (ZOCOR) 40 MG tablet TAKE 1 TABLET BY MOUTH ONCE A DAY  30 tablet  6      BP 140/80  Pulse 72  Temp 98.6 F (37 C)  Resp 16  Ht 5\' 2"  (1.575 m)  Wt 172 lb (78.019 kg)  BMI 31.46 kg/m2       Objective:   Physical Exam  Nursing note and vitals reviewed. Constitutional: She is oriented to person, place, and time. She appears well-nourished.  HENT:  Head: Normocephalic.  Eyes: Conjunctivae are normal. Pupils are equal, round, and reactive to light.  Pulmonary/Chest: Effort normal and breath sounds normal.  Abdominal: Soft. Bowel sounds are normal.  Neurological: She is alert and oriented to person, place, and time.  Skin: Skin is warm and dry.          Assessment & Plan:  This is a complicated picture for this patient has potential surgical back pain but the surgery has high risks that she is not willing to take at this point so we have agreed to pursue medical management of her back pain until such point that she decides that surgery is the better option.  She feels that the current regimen that we're using is controlling her pain adequately but we'll follow her on a careful short-term basis

## 2011-03-14 ENCOUNTER — Telehealth: Payer: Self-pay | Admitting: Internal Medicine

## 2011-03-14 DIAGNOSIS — M79604 Pain in right leg: Secondary | ICD-10-CM

## 2011-03-14 DIAGNOSIS — M545 Pain in right leg: Secondary | ICD-10-CM

## 2011-03-14 MED ORDER — OXYCODONE-ACETAMINOPHEN 5-325 MG PO TABS: 1.0000 | ORAL_TABLET | Freq: Four times a day (QID) | ORAL | Status: DC | PRN

## 2011-03-14 NOTE — Telephone Encounter (Signed)
Pt need new rx oxycodone °

## 2011-03-14 NOTE — Telephone Encounter (Signed)
Pt informed that script is ready for pick up

## 2011-03-18 ENCOUNTER — Ambulatory Visit (INDEPENDENT_AMBULATORY_CARE_PROVIDER_SITE_OTHER): Payer: Medicare Other

## 2011-03-18 DIAGNOSIS — J309 Allergic rhinitis, unspecified: Secondary | ICD-10-CM

## 2011-03-25 ENCOUNTER — Ambulatory Visit (INDEPENDENT_AMBULATORY_CARE_PROVIDER_SITE_OTHER): Payer: Medicare Other

## 2011-03-25 DIAGNOSIS — J309 Allergic rhinitis, unspecified: Secondary | ICD-10-CM

## 2011-03-28 ENCOUNTER — Encounter: Payer: Self-pay | Admitting: Internal Medicine

## 2011-03-28 ENCOUNTER — Ambulatory Visit (INDEPENDENT_AMBULATORY_CARE_PROVIDER_SITE_OTHER): Payer: Medicare Other

## 2011-03-28 DIAGNOSIS — J309 Allergic rhinitis, unspecified: Secondary | ICD-10-CM

## 2011-03-31 ENCOUNTER — Ambulatory Visit: Payer: Medicare Other

## 2011-04-01 ENCOUNTER — Ambulatory Visit (INDEPENDENT_AMBULATORY_CARE_PROVIDER_SITE_OTHER): Payer: Medicare Other

## 2011-04-01 DIAGNOSIS — J309 Allergic rhinitis, unspecified: Secondary | ICD-10-CM

## 2011-04-04 ENCOUNTER — Telehealth: Payer: Self-pay | Admitting: Internal Medicine

## 2011-04-04 DIAGNOSIS — M545 Low back pain: Secondary | ICD-10-CM

## 2011-04-04 DIAGNOSIS — M79604 Pain in right leg: Secondary | ICD-10-CM

## 2011-04-04 MED ORDER — OXYCODONE-ACETAMINOPHEN 5-325 MG PO TABS: 1.0000 | ORAL_TABLET | Freq: Four times a day (QID) | ORAL | Status: DC | PRN

## 2011-04-04 NOTE — Telephone Encounter (Signed)
Refill Oxycodone. Thanks. °

## 2011-04-04 NOTE — Telephone Encounter (Signed)
Left message on machine Ready for pick up 

## 2011-04-08 ENCOUNTER — Ambulatory Visit (INDEPENDENT_AMBULATORY_CARE_PROVIDER_SITE_OTHER): Payer: Medicare Other

## 2011-04-08 DIAGNOSIS — J309 Allergic rhinitis, unspecified: Secondary | ICD-10-CM

## 2011-04-15 ENCOUNTER — Ambulatory Visit (INDEPENDENT_AMBULATORY_CARE_PROVIDER_SITE_OTHER): Payer: Medicare Other

## 2011-04-15 DIAGNOSIS — J309 Allergic rhinitis, unspecified: Secondary | ICD-10-CM

## 2011-04-22 ENCOUNTER — Ambulatory Visit (INDEPENDENT_AMBULATORY_CARE_PROVIDER_SITE_OTHER): Payer: Medicare Other

## 2011-04-22 DIAGNOSIS — J309 Allergic rhinitis, unspecified: Secondary | ICD-10-CM

## 2011-04-28 ENCOUNTER — Telehealth: Payer: Self-pay | Admitting: Internal Medicine

## 2011-04-28 NOTE — Telephone Encounter (Signed)
Has been taking oxycodone 5-325 for several months and helped at first, but now pain is not being relieved and can not sleep- has 7-8 pills remaining--please advise

## 2011-04-28 NOTE — Telephone Encounter (Signed)
Needs to discuss her change in her pain medication. Would like for only [name] to return her call. Thanks.

## 2011-04-29 ENCOUNTER — Ambulatory Visit (INDEPENDENT_AMBULATORY_CARE_PROVIDER_SITE_OTHER): Payer: Medicare Other

## 2011-04-29 ENCOUNTER — Other Ambulatory Visit: Payer: Self-pay | Admitting: *Deleted

## 2011-04-29 DIAGNOSIS — J309 Allergic rhinitis, unspecified: Secondary | ICD-10-CM

## 2011-04-29 MED ORDER — OXYCODONE-ACETAMINOPHEN 10-500 MG PO TABS: 1.0000 | ORAL_TABLET | Freq: Three times a day (TID) | ORAL | Status: DC

## 2011-04-29 NOTE — Telephone Encounter (Signed)
Pt informed and script given to pt

## 2011-04-29 NOTE — Telephone Encounter (Signed)
May increase to 10/500 TID number [id number]

## 2011-04-29 NOTE — Telephone Encounter (Signed)
Pt informed new script is ready for pick up

## 2011-05-02 ENCOUNTER — Other Ambulatory Visit: Payer: Self-pay | Admitting: *Deleted

## 2011-05-02 MED ORDER — OXYCODONE-ACETAMINOPHEN 10-325 MG PO TABS: 1.0000 | ORAL_TABLET | Freq: Three times a day (TID) | ORAL | Status: AC

## 2011-05-06 ENCOUNTER — Ambulatory Visit (INDEPENDENT_AMBULATORY_CARE_PROVIDER_SITE_OTHER): Payer: Medicare Other

## 2011-05-06 DIAGNOSIS — J309 Allergic rhinitis, unspecified: Secondary | ICD-10-CM | POA: Diagnosis not present

## 2011-05-12 ENCOUNTER — Encounter: Payer: Self-pay | Admitting: Internal Medicine

## 2011-05-12 ENCOUNTER — Ambulatory Visit (INDEPENDENT_AMBULATORY_CARE_PROVIDER_SITE_OTHER): Payer: Medicare Other | Admitting: Internal Medicine

## 2011-05-12 DIAGNOSIS — J449 Chronic obstructive pulmonary disease, unspecified: Secondary | ICD-10-CM | POA: Diagnosis not present

## 2011-05-12 DIAGNOSIS — M549 Dorsalgia, unspecified: Secondary | ICD-10-CM

## 2011-05-12 NOTE — Patient Instructions (Addendum)
The patient is instructed to continue all medications as prescribed. Schedule followup with check out clerk upon leaving the clinic Ordering pulmonary function tests

## 2011-05-12 NOTE — Progress Notes (Signed)
Subjective:    Patient ID: [name] [name], female    DOB: [birth year], 78 y.o.   MRN: [id number]  HPI Back pain and pain control Has an appointment with Dr Lovell Sheehan to discuss back surgery The pain is increased and the impact on mobility is significant. The main factor seems to be the mobility issue and the consistency of the pain With constant pain and decline in functioning the patient has determinrd that surgery is indicated   Review of Systems  Constitutional: Negative for activity change, appetite change and fatigue.  HENT: Negative for ear pain, congestion, neck pain, postnasal drip and sinus pressure.   Eyes: Negative for redness and visual disturbance.  Respiratory: Negative for cough, shortness of breath and wheezing.   Gastrointestinal: Negative for abdominal pain and abdominal distention.  Genitourinary: Negative for dysuria, frequency and menstrual problem.  Musculoskeletal: Negative for myalgias, joint swelling and arthralgias.  Skin: Negative for rash and wound.  Neurological: Negative for dizziness, weakness and headaches.  Hematological: Negative for adenopathy. Does not bruise/bleed easily.  Psychiatric/Behavioral: Negative for sleep disturbance and decreased concentration.   Past Medical History  Diagnosis Date  . GERD (gastroesophageal reflux disease)   . Hyperlipidemia   . Hypertension   . Stenosis of popliteal artery   . History of colonic polyps   . Diverticulosis   . Shingles   . Eczema   . Allergic rhinoconjunctivitis   . Lung nodule 2011    History   Social History  . Marital Status: Widowed    Spouse Name: N/A    Number of Children: N/A  . Years of Education: N/A   Occupational History  . Not on file.   Social History Main Topics  . Smoking status: Former Smoker -- 2.0 packs/day    Quit date: 06/18/1993  . Smokeless tobacco: Not on file   Comment: QUIT IN 1995  . Alcohol Use: No  . Drug Use: No  . Sexually Active: Not Currently    Other Topics Concern  . Not on file   Social History Narrative  . No narrative on file    Past Surgical History  Procedure Date  . Femoral-popliteal bypass graft   . Tubal ligation   . Tonsilectomy, adenoidectomy, bilateral myringotomy and tubes   . Cholecystectomy 1998    Family History  Problem Relation Age of Onset  . Arthritis Mother   . Heart disease Father   . Colon polyps Father   . Breast cancer Sister   . Lung cancer Sister   . Colon cancer Sister   . Lung cancer Brother     Allergies  Allergen Reactions  . Latex     REACTION: rash  . Sulfamethoxazole     REACTION: disorientation    Current Outpatient Prescriptions on File Prior to Visit  Medication Sig Dispense Refill  . aspirin 81 MG tablet Take 81 mg by mouth daily.        . calcium-vitamin D (OSCAL WITH D 500-200) 500-200 MG-UNIT per tablet Take 1 tablet by mouth daily.        . carbamazepine (CARBATROL) 100 MG 12 hr capsule Take 1 capsule (100 mg total) by mouth 2 (two) times daily. 1 at night but may take a second one in the morning for pain  60 capsule  3  . Cholecalciferol (VITAMIN D3) 1000 UNITS CAPS Take 1,000 Units by mouth daily.        Marland Kitchen DIOVAN HCT 160-25 MG per tablet TAKE ONE TABLET BY  MOUTH DAILY  90 tablet  3  . ipratropium (ATROVENT) 0.06 % nasal spray 1-2 sprays in each nostril three times a day if needed  15 mL  0  . Multiple Vitamin (MULTIVITAMIN) tablet Take 1 tablet by mouth daily.        . naproxen (NAPROSYN) 500 MG tablet Take 1 tablet (500 mg total) by mouth 2 (two) times daily with a meal.  180 tablet  3  . omeprazole (PRILOSEC) 20 MG capsule Take 20 mg by mouth daily.        Marland Kitchen oxyCODONE-acetaminophen (PERCOCET) 10-325 MG per tablet Take 1 tablet by mouth 3 (three) times daily.  90 tablet  0  . simvastatin (ZOCOR) 40 MG tablet TAKE 1 TABLET BY MOUTH ONCE A DAY  30 tablet  6    BP 140/72  Pulse 72  Temp 98.3 F (36.8 C)  Resp 16  Ht 5\' 2"  (1.575 m)  Wt 175 lb (79.379 kg)   BMI 32.01 kg/m2       Objective:   Physical Exam  Nursing note and vitals reviewed. Constitutional: She is oriented to person, place, and time. She appears well-developed and well-nourished. No distress.  HENT:  Head: Normocephalic and atraumatic.  Right Ear: External ear normal.  Left Ear: External ear normal.  Nose: Nose normal.  Mouth/Throat: Oropharynx is clear and moist.  Eyes: Conjunctivae and EOM are normal. Pupils are equal, round, and reactive to light.  Neck: Normal range of motion. Neck supple. No JVD present. No tracheal deviation present. No thyromegaly present.  Cardiovascular: Normal rate, regular rhythm, normal heart sounds and intact distal pulses.   No murmur heard. Pulmonary/Chest: Effort normal and breath sounds normal. She has no wheezes. She exhibits no tenderness.  Abdominal: Soft. Bowel sounds are normal.  Musculoskeletal: Normal range of motion. She exhibits no edema and no tenderness.  Lymphadenopathy:    She has no cervical adenopathy.  Neurological: She is alert and oriented to person, place, and time. She has normal reflexes. No cranial nerve deficit.  Skin: Skin is warm and dry. She is not diaphoretic.  Psychiatric: She has a normal mood and affect. Her behavior is normal.          Assessment & Plan:  Surgery needed and the risks discussed PFT's prior reviewed risks  I have spent more than 30 minutes examining this patient face-to-face of which over half was spent in counseling

## 2011-05-13 DIAGNOSIS — M545 Low back pain, unspecified: Secondary | ICD-10-CM | POA: Diagnosis not present

## 2011-05-13 DIAGNOSIS — IMO0002 Reserved for concepts with insufficient information to code with codable children: Secondary | ICD-10-CM | POA: Diagnosis not present

## 2011-05-13 DIAGNOSIS — M4310 Spondylolisthesis, site unspecified: Secondary | ICD-10-CM | POA: Diagnosis not present

## 2011-05-20 ENCOUNTER — Ambulatory Visit (INDEPENDENT_AMBULATORY_CARE_PROVIDER_SITE_OTHER): Payer: Medicare Other

## 2011-05-20 DIAGNOSIS — J309 Allergic rhinitis, unspecified: Secondary | ICD-10-CM | POA: Diagnosis not present

## 2011-05-24 ENCOUNTER — Ambulatory Visit (INDEPENDENT_AMBULATORY_CARE_PROVIDER_SITE_OTHER): Payer: Medicare Other | Admitting: Internal Medicine

## 2011-05-24 DIAGNOSIS — J449 Chronic obstructive pulmonary disease, unspecified: Secondary | ICD-10-CM | POA: Diagnosis not present

## 2011-05-24 LAB — PULMONARY FUNCTION TEST

## 2011-05-24 NOTE — Progress Notes (Signed)
PFT done today. 

## 2011-05-26 ENCOUNTER — Encounter: Payer: Self-pay | Admitting: Internal Medicine

## 2011-05-27 ENCOUNTER — Ambulatory Visit (INDEPENDENT_AMBULATORY_CARE_PROVIDER_SITE_OTHER): Payer: Medicare Other

## 2011-05-27 DIAGNOSIS — J309 Allergic rhinitis, unspecified: Secondary | ICD-10-CM | POA: Diagnosis not present

## 2011-05-31 ENCOUNTER — Encounter: Payer: Self-pay | Admitting: Internal Medicine

## 2011-06-01 ENCOUNTER — Other Ambulatory Visit: Payer: Self-pay | Admitting: *Deleted

## 2011-06-01 ENCOUNTER — Other Ambulatory Visit: Payer: Self-pay | Admitting: Internal Medicine

## 2011-06-01 MED ORDER — OXYCODONE-ACETAMINOPHEN 10-500 MG PO TABS: 1.0000 | ORAL_TABLET | Freq: Three times a day (TID) | ORAL | Status: DC

## 2011-06-01 MED ORDER — TIOTROPIUM BROMIDE MONOHYDRATE 18 MCG IN CAPS: 18.0000 ug | ORAL_CAPSULE | Freq: Every day | RESPIRATORY_TRACT | Status: DC

## 2011-06-01 NOTE — Telephone Encounter (Signed)
Per dr Lovell Sheehan- she does have moderate obstructive airway disease- per dr Lovell Sheehan- give spiriva qd - oxycodone is ready for pick up

## 2011-06-01 NOTE — Telephone Encounter (Signed)
Pt needs new rx oxycodone 10-325 mg and results of pfts

## 2011-06-03 ENCOUNTER — Ambulatory Visit (INDEPENDENT_AMBULATORY_CARE_PROVIDER_SITE_OTHER): Payer: Medicare Other

## 2011-06-03 ENCOUNTER — Other Ambulatory Visit: Payer: Self-pay | Admitting: *Deleted

## 2011-06-03 ENCOUNTER — Telehealth: Payer: Self-pay | Admitting: Allergy

## 2011-06-03 DIAGNOSIS — J309 Allergic rhinitis, unspecified: Secondary | ICD-10-CM | POA: Diagnosis not present

## 2011-06-03 MED ORDER — OXYCODONE-ACETAMINOPHEN 10-325 MG PO TABS: 1.0000 | ORAL_TABLET | Freq: Three times a day (TID) | ORAL | Status: DC

## 2011-06-03 NOTE — Telephone Encounter (Signed)
Spoke with pt made her aware that she needed to call dr Lovell Sheehan office on Monday morning to acquire about the pft an the sprivia. She will also call an schedule an appt to see Dr young .nothing further was needed by patient.

## 2011-06-03 NOTE — Telephone Encounter (Signed)
Pt stopped in today for allergy injection had some questions about her pft and  Why she was put on spiriva. Would like some one to call her back and explain this to her.

## 2011-06-03 NOTE — Telephone Encounter (Signed)
The PFT was abnormal, but it was ordered by Dr Lovell Sheehan, the Spiriva was ordered by Dr Lovell Sheehan, and she hasn't made a return appointment to see me.  So I suggest she ask Dr Lovell Sheehan about the PFT and the Spiriva.  She does need to see me at least once a year while on allergy shots.

## 2011-06-06 ENCOUNTER — Telehealth: Payer: Self-pay | Admitting: *Deleted

## 2011-06-06 NOTE — Telephone Encounter (Signed)
Pt would like to speak to Dr. Lovell Sheehan ONLY re: Pulmonary testing to make a decision re: some upcoming surgery, please.

## 2011-06-06 NOTE — Telephone Encounter (Signed)
Per dr Lovell Sheehan- mild obstructive airway disease--use spiriva to help- can probably stop after surgery- pt notified and understands

## 2011-06-09 ENCOUNTER — Encounter: Payer: Self-pay | Admitting: Physician Assistant

## 2011-06-09 ENCOUNTER — Other Ambulatory Visit: Payer: Self-pay | Admitting: Neurosurgery

## 2011-06-10 ENCOUNTER — Ambulatory Visit (INDEPENDENT_AMBULATORY_CARE_PROVIDER_SITE_OTHER): Payer: Medicare Other

## 2011-06-10 ENCOUNTER — Ambulatory Visit (INDEPENDENT_AMBULATORY_CARE_PROVIDER_SITE_OTHER): Payer: Medicare Other | Admitting: Physician Assistant

## 2011-06-10 ENCOUNTER — Encounter (INDEPENDENT_AMBULATORY_CARE_PROVIDER_SITE_OTHER): Payer: Medicare Other | Admitting: *Deleted

## 2011-06-10 ENCOUNTER — Encounter: Payer: Self-pay | Admitting: Physician Assistant

## 2011-06-10 VITALS — BP 136/67 | HR 64 | Ht 62.0 in | Wt 173.0 lb

## 2011-06-10 DIAGNOSIS — Z48812 Encounter for surgical aftercare following surgery on the circulatory system: Secondary | ICD-10-CM | POA: Diagnosis not present

## 2011-06-10 DIAGNOSIS — I70219 Atherosclerosis of native arteries of extremities with intermittent claudication, unspecified extremity: Secondary | ICD-10-CM | POA: Diagnosis not present

## 2011-06-10 DIAGNOSIS — I739 Peripheral vascular disease, unspecified: Secondary | ICD-10-CM

## 2011-06-10 DIAGNOSIS — J309 Allergic rhinitis, unspecified: Secondary | ICD-10-CM | POA: Diagnosis not present

## 2011-06-10 NOTE — Progress Notes (Signed)
[facility] [place]  HISTORY AND PHYSICAL   CC: f/u for right above knee to below knee femoral to popliteal bypass and she has had a left fem pop BPG in the past ~ 16-17 yrs ago.   Jenkins, John Edward, MD   HPI: This is a 78 y.o. female here for f/u for right above knee to below knee femoral to popliteal bypass by Dr. Fields on 04/07/09. ABIs at that time were > 1.0 bilaterally. She returns today stating that she is having back surgery for extensive back pain on 07/06/11. She states that she does not notice pain in her legs and that it is mainly her back that is an issue.  Past Medical History   Diagnosis  Date   .  GERD (gastroesophageal reflux disease)    .  Hyperlipidemia    .  Hypertension    .  Stenosis of popliteal artery    .  History of colonic polyps    .  Diverticulosis    .  Shingles    .  Eczema    .  Allergic rhinoconjunctivitis    .  Lung nodule  2011    Past Surgical History   Procedure  Date   .  Femoral-popliteal bypass graft    .  Tubal ligation    .  Tonsilectomy, adenoidectomy, bilateral myringotomy and tubes    .  Cholecystectomy  1998    Allergies   Allergen  Reactions   .  Latex      REACTION: rash   .  Sulfamethoxazole      REACTION: disorientation    Current Outpatient Prescriptions   Medication  Sig  Dispense  Refill   .  aspirin 81 MG tablet  Take 81 mg by mouth daily.     .  calcium-vitamin D (OSCAL WITH D 500-200) 500-200 MG-UNIT per tablet  Take 1 tablet by mouth daily.     .  carbamazepine (CARBATROL) 100 MG 12 hr capsule  Take 1 capsule (100 mg total) by mouth 2 (two) times daily. 1 at night but may take a second one in the morning for pain  60 capsule  3   .  Cholecalciferol (VITAMIN D3) 1000 UNITS CAPS  Take 1,000 Units by mouth daily.     .  DIOVAN HCT 160-25 MG per tablet  TAKE ONE TABLET BY MOUTH DAILY  90 tablet  3   .  ipratropium (ATROVENT) 0.06 % nasal spray  1-2 sprays in each nostril three times a day if needed   15 mL  0   .  Multiple Vitamin (MULTIVITAMIN) tablet  Take 1 tablet by mouth daily.     .  omeprazole (PRILOSEC) 20 MG capsule  Take 20 mg by mouth daily.     .  oxyCODONE-acetaminophen (PERCOCET) 10-325 MG per tablet  Take 1 tablet by mouth 3 (three) times daily.  90 tablet  0   .  simvastatin (ZOCOR) 40 MG tablet  TAKE 1 TABLET BY MOUTH ONCE A DAY  30 tablet  6   .  tiotropium (SPIRIVA HANDIHALER) 18 MCG inhalation capsule  Place 1 capsule (18 mcg total) into inhaler and inhale daily.  30 capsule  12    Family History   Problem  Relation  Age of Onset   .  Arthritis  Mother    .  Heart disease  Father    .  Colon polyps  Father    .    Breast cancer  Sister    .  Lung cancer  Sister    .  Colon cancer  Sister    .  Lung cancer  Brother     History    Social History   .  Marital Status:  Widowed     Spouse Name:  N/A     Number of Children:  N/A   .  Years of Education:  N/A    Occupational History   .  Not on file.    Social History Main Topics   .  Smoking status:  Former Smoker -- 2.0 packs/day     Quit date:  06/18/1993   .  Smokeless tobacco:  Not on file     Comment: QUIT IN 1995    .  Alcohol Use:  No   .  Drug Use:  No   .  Sexually Active:  Not Currently    Other Topics  Concern   .  Not on file    Social History Narrative   .  No narrative on file    ROS: [x] Positive [ ] Negative [ ] All sytems reviewed and are negative  Cardiovascular: []chest pain; []chest pressure; []palpitations; []SOB lying flat; []DOE; []pain in legs with walking; []pain in legs when lying flat; [x]Hx of DVT; []Hx phlebitis; []swelling in legs; []varicose veins  Pulmonary: []productive cough; []asthma; []wheezing  Neurologic: []Hx CVA; []weakness in arms or legs; []numbness in arms or legs; []difficulty in speaking or slurred speech; []temporary loss of vision in one eye; [x]dizziness (from meds)  Hematologic: []bleeding problems; []clots easily  GI: []vomiting blood; [] blood in  stool; []PUD  GU: [] Dysuria; []hematuria  Psychiatric: []Hx major depression  Integumentary: []rashes; []ulcers  Constitutional: []fever; []chills   PHYSICAL EXAMINATION:  Filed Vitals:    06/10/11 1406   BP:  136/67   Pulse:  64    There is no height or weight on file to calculate BMI.  General: WDWN in NAD  Gait: Normal  HENT: WNL  Eyes: PERRL  Pulmonary: normal non-labored breathing , without Rales, rhonchi, wheezing  Cardiac: RRR, without Murmurs, rubs or gallops;  Abdomen: soft, NT, no masses  Skin: no rashes, ulcers noted  Vascular Exam/Pulses: She has palpable DP pulses bilaterally.  Extremities without ischemic changes, no Gangrene , no cellulitis; no open wounds; no edema  Musculoskeletal: no muscle wasting or atrophy  Neurologic: A&O X 3; Appropriate Affect ; SENSATION: normal; MOTOR FUNCTION: moving all extremities equally. Speech is fluent/normal   Non-Invasive Vascular Imaging:  06/10/2011  ABIs 0.76 on the right  ABIs 0.78 on the left  Previous ABIs on 11/29/10  Right 1.01  Left 1.05  The velocities in the proximal portion of the graft have increased from 190 to 339 on the right.  There are elevated velocities in the proximal portion on the left at 222, but the left was not done the last visit and therefore, nothing to compare to.   ASSESSMENT: 78 y.o. female f/u for right above knee to below knee femoral to popliteal bypass 04/07/09 and la left fem pop bypass graft ~ 16-17 yrs ago. She is very active and volunteers at [employer] 2 days a week and works at the [employer] 2 day/week. She has minimal complaints about claudication, however, the pain in her back may be limiting her walking.   PLAN: She is planning on having back surgery on 07/06/11. Given her velocities are significantly elevated, will d/w   Dr. Fields if he wants to schedule an angiogram to evaluate before her back surgery. I will have him call the pt to discuss plan next week.   Constanza Mincy Ellington, PA-C    [facility]  [phone]   Clinic MD Chen  

## 2011-06-13 ENCOUNTER — Encounter (HOSPITAL_COMMUNITY): Payer: Self-pay

## 2011-06-13 ENCOUNTER — Other Ambulatory Visit: Payer: Self-pay | Admitting: *Deleted

## 2011-06-16 MED ORDER — CEFAZOLIN SODIUM 1-5 GM-% IV SOLN
1.0000 g | INTRAVENOUS | Status: DC
  Filled 2011-06-16: qty 50

## 2011-06-17 ENCOUNTER — Other Ambulatory Visit: Payer: Self-pay

## 2011-06-17 ENCOUNTER — Encounter (HOSPITAL_COMMUNITY)
Admission: RE | Disposition: A | Discharge status: Home / Self Care | Payer: Self-pay | Source: Ambulatory Visit | Attending: Vascular Surgery

## 2011-06-17 ENCOUNTER — Ambulatory Visit (HOSPITAL_COMMUNITY)
Admission: RE | Admit: 2011-06-17 | Discharge: 2011-06-17 | Disposition: A | Discharge status: Home / Self Care | Payer: Medicare Other | Source: Ambulatory Visit | Attending: Vascular Surgery | Admitting: Vascular Surgery

## 2011-06-17 DIAGNOSIS — E785 Hyperlipidemia, unspecified: Secondary | ICD-10-CM | POA: Diagnosis not present

## 2011-06-17 DIAGNOSIS — K219 Gastro-esophageal reflux disease without esophagitis: Secondary | ICD-10-CM | POA: Insufficient documentation

## 2011-06-17 DIAGNOSIS — I10 Essential (primary) hypertension: Secondary | ICD-10-CM | POA: Diagnosis not present

## 2011-06-17 DIAGNOSIS — I70309 Unspecified atherosclerosis of unspecified type of bypass graft(s) of the extremities, unspecified extremity: Secondary | ICD-10-CM | POA: Insufficient documentation

## 2011-06-17 DIAGNOSIS — I739 Peripheral vascular disease, unspecified: Secondary | ICD-10-CM | POA: Diagnosis not present

## 2011-06-17 HISTORY — PX: ABDOMINAL AORTAGRAM: SHX5454

## 2011-06-17 LAB — POCT I-STAT, CHEM 8
BUN: 32 mg/dL — ABNORMAL HIGH (ref 6–23)
Chloride: 107 mEq/L (ref 96–112)
Potassium: 5.2 mEq/L — ABNORMAL HIGH (ref 3.5–5.1)
Sodium: 137 mEq/L (ref 135–145)

## 2011-06-17 SURGERY — ABDOMINAL AORTAGRAM
Anesthesia: LOCAL

## 2011-06-17 MED ORDER — MORPHINE SULFATE 10 MG/ML IJ SOLN: 2.0000 mg | INTRAMUSCULAR | Status: DC | PRN

## 2011-06-17 MED ORDER — ACETAMINOPHEN 325 MG RE SUPP: 325.0000 mg | RECTAL | Status: DC | PRN

## 2011-06-17 MED ORDER — SODIUM CHLORIDE 0.45 % IV SOLN: INTRAVENOUS | Status: DC

## 2011-06-17 MED ORDER — MIDAZOLAM HCL 2 MG/2ML IJ SOLN
INTRAMUSCULAR | Status: AC
  Filled 2011-06-17: qty 2

## 2011-06-17 MED ORDER — PHENOL 1.4 % MT LIQD: 1.0000 | OROMUCOSAL | Status: DC | PRN

## 2011-06-17 MED ORDER — METOPROLOL TARTRATE 1 MG/ML IV SOLN: 2.0000 mg | INTRAVENOUS | Status: DC | PRN

## 2011-06-17 MED ORDER — HYDRALAZINE HCL 20 MG/ML IJ SOLN: 10.0000 mg | INTRAMUSCULAR | Status: DC | PRN

## 2011-06-17 MED ORDER — MORPHINE SULFATE 10 MG/ML IJ SOLN
INTRAMUSCULAR | Status: AC
  Filled 2011-06-17: qty 1

## 2011-06-17 MED ORDER — ONDANSETRON HCL 4 MG/2ML IJ SOLN: 4.0000 mg | Freq: Four times a day (QID) | INTRAMUSCULAR | Status: DC | PRN

## 2011-06-17 MED ORDER — OXYCODONE HCL 5 MG PO TABS
5.0000 mg | ORAL_TABLET | ORAL | Status: DC | PRN
  Administered 2011-06-17: 5 mg via ORAL

## 2011-06-17 MED ORDER — SODIUM CHLORIDE 0.9 % IV SOLN
INTRAVENOUS | Status: DC
  Administered 2011-06-17: 10:00:00 via INTRAVENOUS

## 2011-06-17 MED ORDER — ACETAMINOPHEN 325 MG PO TABS: 325.0000 mg | ORAL_TABLET | ORAL | Status: DC | PRN

## 2011-06-17 MED ORDER — GUAIFENESIN-DM 100-10 MG/5ML PO SYRP: 15.0000 mL | ORAL_SOLUTION | ORAL | Status: DC | PRN

## 2011-06-17 MED ORDER — FENTANYL CITRATE 0.05 MG/ML IJ SOLN
INTRAMUSCULAR | Status: AC
  Filled 2011-06-17: qty 2

## 2011-06-17 MED ORDER — SODIUM CHLORIDE 0.9 % IV SOLN: 500.0000 mL | Freq: Once | INTRAVENOUS | Status: DC | PRN

## 2011-06-17 MED ORDER — LABETALOL HCL 5 MG/ML IV SOLN: 10.0000 mg | INTRAVENOUS | Status: DC | PRN

## 2011-06-17 MED ORDER — OXYCODONE HCL 5 MG PO TABS
ORAL_TABLET | ORAL | Status: AC
  Filled 2011-06-17: qty 2

## 2011-06-17 NOTE — Procedures (Unsigned)
BYPASS GRAFT EVALUATION  INDICATION:  Follow up bilateral bypass grafts.  HISTORY: Diabetes:  No. Cardiac:  No. Hypertension:  Yes. Smoking:  Previous. Previous Surgery:  Bilateral femoral-to-popliteal bypass grafts in 1996, thrombolysis angioplasty of the right femoral-to-popliteal bypass graft on 11/15/2008, thrombectomy/angioplasty with interpositional graft from above knee-to-below knee popliteal artery on 04/07/2009.  SINGLE LEVEL ARTERIAL EXAM                              RIGHT              LEFT Brachial:                    122                119 Anterior tibial:             93                 95 Posterior tibial:            76                 73 Peroneal: Ankle/brachial index:        0.76               0.78  PREVIOUS ABI:  Date: 11/29/2010  RIGHT:  1.01  LEFT:  1.05  LOWER EXTREMITY BYPASS GRAFT DUPLEX EXAM:  DUPLEX: 1. Patent right femoral-to-popliteal bypass graft with an elevated     velocity of 339 cm/s noted at the proximal anastomosis. 2. Patent left femoral-to-popliteal bypass graft with an elevated     velocity noted in the common femoral artery and proximal     anastomosis of 263 cm/s and 222 cm/s.  IMPRESSION: 1. Bilateral ankle brachial indices have declined significantly in     comparison to the previous exam. 2. Patent bilateral femoral-to-popliteal bypass grafts with elevated     velocities as described above.  ___________________________________________ [name] [name]. Fields, MD  EM/MEDQ  D:  06/13/2011  T:  06/13/2011  Job:  [id number]

## 2011-06-17 NOTE — Interval H&P Note (Signed)
History and Physical Interval Note:  06/17/2011 10:49 AM  [name] [name]  has presented today for surgery, with the diagnosis of PVD  The various methods of treatment have been discussed with the patient and family. After consideration of risks, benefits and other options for treatment, the patient has consented to  Procedure(s) (LRB): ABDOMINAL AORTAGRAM (N/A) as a surgical intervention .  The patients' history has been reviewed, patient examined, no change in status, stable for surgery.  I have reviewed the patients' chart and labs.  Questions were answered to the patient's satisfaction.     Bryndan Bilyk E

## 2011-06-17 NOTE — Op Note (Addendum)
Procedure: Aortogram with bilateral lower extremity runoff  Preoperative diagnosis: Proximal bypass stenosis  Postoperative diagnosis: Same  Anesthesia Local  Operative details: After obtaining informed consent, the patient was taken to the [facility] LAB. The patient was placed in supine position on the Angio table. Both groins were prepped and draped in usual sterile fashion. Local anesthesia was infiltrated over the left common femoral artery. Initially, ultrasound was used to identify the common femoral artery and an attempt was made to use a micropuncture to establish access.  After 2 passes I felt that I could not visualized the artery very well so I reverted to a standard technique of palpation.  An introducer needle was used to cannulate the left common femoral artery and 035 versacore wire threaded into the abdominal aorta under fluoroscopic guidance. Next a 5 French sheath is placed over the guidewire in the left common femoral artery. A 5 French pigtail catheter was placed over the guidewire into the abdominal aorta and abdominal aortogram was obtained. The infrarenal abdominal aorta is patent. The left and right common internal and external iliac arteries are patent.   Bilateral lower extremity runoff was then performed via the pigtail catheter.     In the right leg, the common femoral artery is patent.  The right SFA is chronically occluded.  The profunda is patent.   There is a patent bypass originating from the right common femoral and terminating in the right below knee popliteal artery.  This is patent with 25% narrowing of the proximal anastomosis.   The below knee popliteal artery is patent.  There is 2 vessel runoff via the Anterior tibial and peroneal arteries.  The peroneal is very diseased.  The posterior tibial artery is occluded.     In the left leg, the common femoral artery is patent.  The left SFA is chronically occluded.  The profunda is patent.   There is a patent bypass originating  from the left common femoral and terminating in the left above knee popliteal artery.  This is patent without significant narrowing. The below knee popliteal artery is patent.  There is 2 vessel runoff via the Anterior tibial and peroneal arteries.  The peroneal is very diseased.  The posterior tibial artery is occluded.     In order to get increased opacification and additional views of the right femoral anastomosis a 5 Fr crossover catheter was brought up on the field.  The crossover catheter was used to selectively catheterize the right common iliac artery and the guidewire advanced into the right distal external iliac artery. The crossover catheter was removed and replaced with a 5 French straight catheter. Angiogram was then performed the right lower extremity. Multiple projections of the right femoral artery were performed showing no significant stenosis of the proximal anastomosis.     Next the 5 French straight catheter was removed over a guidewire.  The 5 Fr sheath was left in place to be pulled in the holding area. The patient tolerated the procedure well and there were no complications. Patient was taken to the holding area in stable condition.  Operative findings: Left leg- patent right femoral to above knee popliteal bypass with 2 vessel runoff via the AT and peroneal Right leg- patent right femoral to below knee popliteal bypass with 2 vessel runoff via the AT and peroneal    Management: The patient will be scheduled for a follow up duplex and ABI in 1 year  Fabienne Bruns, MD [facility] [place]  Office: [phone] Pager: [phone]

## 2011-06-17 NOTE — H&P (View-Only) (Signed)
[facility] [place]  HISTORY AND PHYSICAL   CC: f/u for right above knee to below knee femoral to popliteal bypass and she has had a left fem pop BPG in the past ~ 16-17 yrs ago.   Carrie Mew, MD   HPI: This is a 78 y.o. female here for f/u for right above knee to below knee femoral to popliteal bypass by Dr. Darrick Penna on 04/07/09. ABIs at that time were > 1.0 bilaterally. She returns today stating that she is having back surgery for extensive back pain on 07/06/11. She states that she does not notice pain in her legs and that it is mainly her back that is an issue.  Past Medical History   Diagnosis  Date   .  GERD (gastroesophageal reflux disease)    .  Hyperlipidemia    .  Hypertension    .  Stenosis of popliteal artery    .  History of colonic polyps    .  Diverticulosis    .  Shingles    .  Eczema    .  Allergic rhinoconjunctivitis    .  Lung nodule  2011    Past Surgical History   Procedure  Date   .  Femoral-popliteal bypass graft    .  Tubal ligation    .  Tonsilectomy, adenoidectomy, bilateral myringotomy and tubes    .  Cholecystectomy  1998    Allergies   Allergen  Reactions   .  Latex      REACTION: rash   .  Sulfamethoxazole      REACTION: disorientation    Current Outpatient Prescriptions   Medication  Sig  Dispense  Refill   .  aspirin 81 MG tablet  Take 81 mg by mouth daily.     .  calcium-vitamin D (OSCAL WITH D 500-200) 500-200 MG-UNIT per tablet  Take 1 tablet by mouth daily.     .  carbamazepine (CARBATROL) 100 MG 12 hr capsule  Take 1 capsule (100 mg total) by mouth 2 (two) times daily. 1 at night but may take a second one in the morning for pain  60 capsule  3   .  Cholecalciferol (VITAMIN D3) 1000 UNITS CAPS  Take 1,000 Units by mouth daily.     Marland Kitchen  DIOVAN HCT 160-25 MG per tablet  TAKE ONE TABLET BY MOUTH DAILY  90 tablet  3   .  ipratropium (ATROVENT) 0.06 % nasal spray  1-2 sprays in each nostril three times a day if needed   15 mL  0   .  Multiple Vitamin (MULTIVITAMIN) tablet  Take 1 tablet by mouth daily.     Marland Kitchen  omeprazole (PRILOSEC) 20 MG capsule  Take 20 mg by mouth daily.     Marland Kitchen  oxyCODONE-acetaminophen (PERCOCET) 10-325 MG per tablet  Take 1 tablet by mouth 3 (three) times daily.  90 tablet  0   .  simvastatin (ZOCOR) 40 MG tablet  TAKE 1 TABLET BY MOUTH ONCE A DAY  30 tablet  6   .  tiotropium (SPIRIVA HANDIHALER) 18 MCG inhalation capsule  Place 1 capsule (18 mcg total) into inhaler and inhale daily.  30 capsule  12    Family History   Problem  Relation  Age of Onset   .  Arthritis  Mother    .  Heart disease  Father    .  Colon polyps  Father    .  Breast cancer  Sister    .  Lung cancer  Sister    .  Colon cancer  Sister    .  Lung cancer  Brother     History    Social History   .  Marital Status:  Widowed     Spouse Name:  N/A     Number of Children:  N/A   .  Years of Education:  N/A    Occupational History   .  Not on file.    Social History Main Topics   .  Smoking status:  Former Smoker -- 2.0 packs/day     Quit date:  06/18/1993   .  Smokeless tobacco:  Not on file     Comment: QUIT IN 1995    .  Alcohol Use:  No   .  Drug Use:  No   .  Sexually Active:  Not Currently    Other Topics  Concern   .  Not on file    Social History Narrative   .  No narrative on file    ROS: [x]  Positive [ ]  Negative [ ]  All sytems reviewed and are negative  Cardiovascular: [] chest pain; [] chest pressure; [] palpitations; [] SOB lying flat; [] DOE; [] pain in legs with walking; [] pain in legs when lying flat; [x] Hx of DVT; [] Hx phlebitis; [] swelling in legs; [] varicose veins  Pulmonary: [] productive cough; [] asthma; [] wheezing  Neurologic: [] Hx CVA; [] weakness in arms or legs; [] numbness in arms or legs; [] difficulty in speaking or slurred speech; [] temporary loss of vision in one eye; [x] dizziness (from meds)  Hematologic: [] bleeding problems; [] clots easily  GI: [] vomiting blood; []  blood in  stool; [] PUD  GU: []  Dysuria; [] hematuria  Psychiatric: [] Hx major depression  Integumentary: [] rashes; [] ulcers  Constitutional: [] fever; [] chills   PHYSICAL EXAMINATION:  Filed Vitals:    06/10/11 1406   BP:  136/67   Pulse:  64    There is no height or weight on file to calculate BMI.  General: WDWN in NAD  Gait: Normal  HENT: WNL  Eyes: PERRL  Pulmonary: normal non-labored breathing , without Rales, rhonchi, wheezing  Cardiac: RRR, without Murmurs, rubs or gallops;  Abdomen: soft, NT, no masses  Skin: no rashes, ulcers noted  Vascular Exam/Pulses: She has palpable DP pulses bilaterally.  Extremities without ischemic changes, no Gangrene , no cellulitis; no open wounds; no edema  Musculoskeletal: no muscle wasting or atrophy  Neurologic: A&O X 3; Appropriate Affect ; SENSATION: normal; MOTOR FUNCTION: moving all extremities equally. Speech is fluent/normal   Non-Invasive Vascular Imaging:  06/10/2011  ABIs 0.76 on the right  ABIs 0.78 on the left  Previous ABIs on 11/29/10  Right 1.01  Left 1.05  The velocities in the proximal portion of the graft have increased from 190 to 339 on the right.  There are elevated velocities in the proximal portion on the left at 222, but the left was not done the last visit and therefore, nothing to compare to.   ASSESSMENT: 78 y.o. female f/u for right above knee to below knee femoral to popliteal bypass 04/07/09 and la left fem pop bypass graft ~ 16-17 yrs ago. She is very active and volunteers at [facility] 2 days a week and works at the [employer] 2 day/week. She has minimal complaints about claudication, however, the pain in her back may be limiting her walking.   PLAN: She is planning on having back surgery on 07/06/11. Given her velocities are significantly elevated, will d/w  Dr. Darrick Penna if he wants to schedule an angiogram to evaluate before her back surgery. I will have him call the pt to discuss plan next week.   Newton Pigg, PA-C    [facility]  [phone]   Clinic MD Imogene Burn

## 2011-06-21 ENCOUNTER — Encounter (HOSPITAL_COMMUNITY): Payer: Self-pay | Admitting: Pharmacy Technician

## 2011-06-24 ENCOUNTER — Ambulatory Visit (INDEPENDENT_AMBULATORY_CARE_PROVIDER_SITE_OTHER): Payer: Medicare Other

## 2011-06-24 DIAGNOSIS — J309 Allergic rhinitis, unspecified: Secondary | ICD-10-CM

## 2011-06-27 ENCOUNTER — Encounter (HOSPITAL_COMMUNITY)
Admission: RE | Admit: 2011-06-27 | Discharge: 2011-06-27 | Disposition: A | Discharge status: Home / Self Care | Payer: Medicare Other | Source: Ambulatory Visit | Attending: Anesthesiology | Admitting: Anesthesiology

## 2011-06-27 ENCOUNTER — Encounter (HOSPITAL_COMMUNITY)
Admission: RE | Admit: 2011-06-27 | Discharge: 2011-06-27 | Disposition: A | Discharge status: Home / Self Care | Payer: Medicare Other | Source: Ambulatory Visit | Attending: Neurosurgery | Admitting: Neurosurgery

## 2011-06-27 ENCOUNTER — Encounter (HOSPITAL_COMMUNITY): Payer: Self-pay

## 2011-06-27 DIAGNOSIS — J984 Other disorders of lung: Secondary | ICD-10-CM | POA: Diagnosis not present

## 2011-06-27 DIAGNOSIS — R911 Solitary pulmonary nodule: Secondary | ICD-10-CM | POA: Diagnosis not present

## 2011-06-27 DIAGNOSIS — Z01811 Encounter for preprocedural respiratory examination: Secondary | ICD-10-CM | POA: Diagnosis not present

## 2011-06-27 HISTORY — DX: Anxiety disorder, unspecified: F41.9

## 2011-06-27 HISTORY — DX: Other allergy status, other than to drugs and biological substances: Z91.09

## 2011-06-27 HISTORY — DX: Unspecified osteoarthritis, unspecified site: M19.90

## 2011-06-27 LAB — BASIC METABOLIC PANEL
Chloride: 98 mEq/L (ref 96–112)
Creatinine, Ser: 0.96 mg/dL (ref 0.50–1.10)
GFR calc Af Amer: 61 mL/min — ABNORMAL LOW (ref >90)
Potassium: 4.6 mEq/L (ref 3.5–5.1)

## 2011-06-27 LAB — SURGICAL PCR SCREEN
MRSA, PCR: NEGATIVE
Staphylococcus aureus: POSITIVE — AB

## 2011-06-27 LAB — CBC
HCT: 38 % (ref 36.0–46.0)
Hemoglobin: 12.9 g/dL (ref 12.0–15.0)
RDW: 13.3 % (ref 11.5–15.5)
WBC: 13.1 10*3/uL — ABNORMAL HIGH (ref 4.0–10.5)

## 2011-06-27 NOTE — Pre-Procedure Instructions (Signed)
[name] [name]  06/27/2011   Your procedure is scheduled on:  07/06/11  Report to [facility] at 6:30 AM.  Call this number if you have problems the morning of surgery: [phone]   Remember:   Do not eat food:After Midnight.  May have clear liquids: up to 4 Hours before arrival.  Clear liquids include soda, tea, black coffee, apple or grape juice, broth.  Take these medicines the morning of surgery with A SIP OF WATER: PRILOSEC, OXYCODONE   Do not wear jewelry, make-up or nail polish.  Do not wear lotions, powders, or perfumes. You may wear deodorant.  Do not shave 48 hours prior to surgery.  Do not bring valuables to the hospital.  Contacts, dentures or bridgework may not be worn into surgery.  Leave suitcase in the car. After surgery it may be brought to your room.  For patients admitted to the hospital, checkout time is 11:00 AM the day of discharge.   Patients discharged the day of surgery will not be allowed to drive home.  Name and phone number of your driver:  With Daughter   Special Instructions: CHG Shower Use Special Wash: 1/2 bottle night before surgery and 1/2 bottle morning of surgery.   Please read over the following fact sheets that you were given: Pain Booklet, Coughing and Deep Breathing, Blood Transfusion Information, MRSA Information and Surgical Site Infection Prevention

## 2011-06-28 ENCOUNTER — Other Ambulatory Visit: Payer: Self-pay | Admitting: Internal Medicine

## 2011-06-30 ENCOUNTER — Telehealth: Payer: Self-pay | Admitting: Internal Medicine

## 2011-06-30 MED ORDER — OXYCODONE-ACETAMINOPHEN 10-325 MG PO TABS: 1.0000 | ORAL_TABLET | Freq: Three times a day (TID) | ORAL | Status: DC

## 2011-06-30 NOTE — Telephone Encounter (Signed)
Printed a nd ready for pick up in am =pt notified

## 2011-06-30 NOTE — Telephone Encounter (Signed)
Pt is sched for surgery on her back on Wed 07/06/11. Pt doesn't have enough oxycodone to last until after surgery and didn't know if pcp wanted to give patient a partial refill or full refill of this med. Pt said that the surgeon may decided to change pain med. Pt req call back from nurse.

## 2011-07-01 ENCOUNTER — Ambulatory Visit (INDEPENDENT_AMBULATORY_CARE_PROVIDER_SITE_OTHER): Payer: Medicare Other

## 2011-07-01 DIAGNOSIS — J309 Allergic rhinitis, unspecified: Secondary | ICD-10-CM

## 2011-07-06 ENCOUNTER — Encounter (HOSPITAL_COMMUNITY)
Admission: RE | Disposition: A | Discharge status: Skilled Nursing Facility | Payer: Self-pay | Source: Ambulatory Visit | Attending: Neurosurgery

## 2011-07-06 ENCOUNTER — Encounter (HOSPITAL_COMMUNITY): Payer: Self-pay | Admitting: Anesthesiology

## 2011-07-06 ENCOUNTER — Inpatient Hospital Stay (HOSPITAL_COMMUNITY): Payer: Medicare Other | Admitting: Anesthesiology

## 2011-07-06 ENCOUNTER — Encounter (HOSPITAL_COMMUNITY): Payer: Self-pay | Admitting: *Deleted

## 2011-07-06 ENCOUNTER — Inpatient Hospital Stay (HOSPITAL_COMMUNITY): Payer: Medicare Other

## 2011-07-06 ENCOUNTER — Inpatient Hospital Stay (HOSPITAL_COMMUNITY)
Admission: RE | Admit: 2011-07-06 | Discharge: 2011-07-11 | DRG: 460 | Disposition: A | Discharge status: Skilled Nursing Facility | Payer: Medicare Other | Source: Ambulatory Visit | Attending: Neurosurgery | Admitting: Neurosurgery

## 2011-07-06 ENCOUNTER — Encounter (HOSPITAL_COMMUNITY): Payer: Self-pay | Admitting: General Practice

## 2011-07-06 DIAGNOSIS — I10 Essential (primary) hypertension: Secondary | ICD-10-CM | POA: Diagnosis present

## 2011-07-06 DIAGNOSIS — M48062 Spinal stenosis, lumbar region with neurogenic claudication: Secondary | ICD-10-CM | POA: Diagnosis present

## 2011-07-06 DIAGNOSIS — R7309 Other abnormal glucose: Secondary | ICD-10-CM | POA: Diagnosis not present

## 2011-07-06 DIAGNOSIS — M48061 Spinal stenosis, lumbar region without neurogenic claudication: Secondary | ICD-10-CM | POA: Diagnosis not present

## 2011-07-06 DIAGNOSIS — M51379 Other intervertebral disc degeneration, lumbosacral region without mention of lumbar back pain or lower extremity pain: Secondary | ICD-10-CM | POA: Diagnosis present

## 2011-07-06 DIAGNOSIS — Z79899 Other long term (current) drug therapy: Secondary | ICD-10-CM | POA: Diagnosis not present

## 2011-07-06 DIAGNOSIS — Z01812 Encounter for preprocedural laboratory examination: Secondary | ICD-10-CM

## 2011-07-06 DIAGNOSIS — Z5189 Encounter for other specified aftercare: Secondary | ICD-10-CM | POA: Diagnosis not present

## 2011-07-06 DIAGNOSIS — M549 Dorsalgia, unspecified: Secondary | ICD-10-CM | POA: Diagnosis not present

## 2011-07-06 DIAGNOSIS — K222 Esophageal obstruction: Secondary | ICD-10-CM | POA: Diagnosis not present

## 2011-07-06 DIAGNOSIS — K219 Gastro-esophageal reflux disease without esophagitis: Secondary | ICD-10-CM | POA: Diagnosis present

## 2011-07-06 DIAGNOSIS — M4310 Spondylolisthesis, site unspecified: Secondary | ICD-10-CM | POA: Diagnosis not present

## 2011-07-06 DIAGNOSIS — M4800 Spinal stenosis, site unspecified: Secondary | ICD-10-CM | POA: Diagnosis not present

## 2011-07-06 DIAGNOSIS — E669 Obesity, unspecified: Secondary | ICD-10-CM | POA: Diagnosis present

## 2011-07-06 DIAGNOSIS — Q762 Congenital spondylolisthesis: Secondary | ICD-10-CM | POA: Diagnosis not present

## 2011-07-06 DIAGNOSIS — Z87891 Personal history of nicotine dependence: Secondary | ICD-10-CM | POA: Diagnosis not present

## 2011-07-06 DIAGNOSIS — Z981 Arthrodesis status: Secondary | ICD-10-CM | POA: Diagnosis not present

## 2011-07-06 DIAGNOSIS — M5137 Other intervertebral disc degeneration, lumbosacral region: Secondary | ICD-10-CM | POA: Diagnosis not present

## 2011-07-06 DIAGNOSIS — S2239XA Fracture of one rib, unspecified side, initial encounter for closed fracture: Secondary | ICD-10-CM | POA: Diagnosis not present

## 2011-07-06 DIAGNOSIS — J449 Chronic obstructive pulmonary disease, unspecified: Secondary | ICD-10-CM | POA: Diagnosis not present

## 2011-07-06 DIAGNOSIS — E785 Hyperlipidemia, unspecified: Secondary | ICD-10-CM | POA: Diagnosis present

## 2011-07-06 DIAGNOSIS — F411 Generalized anxiety disorder: Secondary | ICD-10-CM | POA: Diagnosis not present

## 2011-07-06 DIAGNOSIS — Z01818 Encounter for other preprocedural examination: Secondary | ICD-10-CM | POA: Diagnosis not present

## 2011-07-06 DIAGNOSIS — I739 Peripheral vascular disease, unspecified: Secondary | ICD-10-CM | POA: Diagnosis not present

## 2011-07-06 DIAGNOSIS — Q7649 Other congenital malformations of spine, not associated with scoliosis: Secondary | ICD-10-CM | POA: Diagnosis not present

## 2011-07-06 DIAGNOSIS — M4316 Spondylolisthesis, lumbar region: Secondary | ICD-10-CM

## 2011-07-06 HISTORY — DX: Chronic obstructive pulmonary disease, unspecified: J44.9

## 2011-07-06 HISTORY — PX: LUMBAR FUSION: SHX111

## 2011-07-06 LAB — TYPE AND SCREEN

## 2011-07-06 SURGERY — POSTERIOR LUMBAR FUSION 1 LEVEL
Anesthesia: General | Site: Back | Wound class: Clean

## 2011-07-06 MED ORDER — THROMBIN 20000 UNITS EX KIT
PACK | CUTANEOUS | Status: DC | PRN
  Administered 2011-07-06: 10:00:00 via TOPICAL

## 2011-07-06 MED ORDER — CARBAMAZEPINE ER 100 MG PO CP12: 100.0000 mg | ORAL_CAPSULE | Freq: Two times a day (BID) | ORAL | Status: DC

## 2011-07-06 MED ORDER — SODIUM CHLORIDE 0.9 % IV SOLN
10.0000 mg | INTRAVENOUS | Status: DC | PRN
  Administered 2011-07-06: 25 ug/min via INTRAVENOUS

## 2011-07-06 MED ORDER — SODIUM CHLORIDE 0.9 % IV SOLN: 250.0000 mL | INTRAVENOUS | Status: DC

## 2011-07-06 MED ORDER — ACETAMINOPHEN 325 MG PO TABS
650.0000 mg | ORAL_TABLET | ORAL | Status: DC | PRN
  Administered 2011-07-06: 650 mg via ORAL
  Filled 2011-07-06: qty 2

## 2011-07-06 MED ORDER — MORPHINE SULFATE (PF) 1 MG/ML IV SOLN
INTRAVENOUS | Status: AC
  Administered 2011-07-06: 23:00:00
  Filled 2011-07-06: qty 25

## 2011-07-06 MED ORDER — DEXAMETHASONE SODIUM PHOSPHATE 4 MG/ML IJ SOLN
INTRAMUSCULAR | Status: DC | PRN
  Administered 2011-07-06: 8 mg via INTRAVENOUS

## 2011-07-06 MED ORDER — ROCURONIUM BROMIDE 100 MG/10ML IV SOLN
INTRAVENOUS | Status: DC | PRN
  Administered 2011-07-06: 50 mg via INTRAVENOUS

## 2011-07-06 MED ORDER — SODIUM CHLORIDE 0.9 % IJ SOLN
3.0000 mL | Freq: Two times a day (BID) | INTRAMUSCULAR | Status: DC
  Administered 2011-07-06 – 2011-07-09 (×7): 3 mL via INTRAVENOUS

## 2011-07-06 MED ORDER — DIPHENHYDRAMINE HCL 50 MG/ML IJ SOLN
INTRAMUSCULAR | Status: DC | PRN
  Administered 2011-07-06: 12.5 mg via INTRAVENOUS

## 2011-07-06 MED ORDER — CALCIUM CARBONATE-VITAMIN D 500-200 MG-UNIT PO TABS
1.0000 | ORAL_TABLET | Freq: Every day | ORAL | Status: DC
  Administered 2011-07-07 – 2011-07-11 (×5): 1 via ORAL
  Filled 2011-07-06 (×7): qty 1

## 2011-07-06 MED ORDER — CEFAZOLIN SODIUM-DEXTROSE 2-3 GM-% IV SOLR
INTRAVENOUS | Status: AC
  Administered 2011-07-06: 2 g via INTRAVENOUS
  Filled 2011-07-06: qty 50

## 2011-07-06 MED ORDER — DIPHENHYDRAMINE HCL 12.5 MG/5ML PO ELIX: 12.5000 mg | ORAL_SOLUTION | Freq: Four times a day (QID) | ORAL | Status: DC | PRN

## 2011-07-06 MED ORDER — PROPOFOL 10 MG/ML IV EMUL
INTRAVENOUS | Status: DC | PRN
  Administered 2011-07-06: 150 mg via INTRAVENOUS

## 2011-07-06 MED ORDER — NALOXONE HCL 0.4 MG/ML IJ SOLN: 0.4000 mg | INTRAMUSCULAR | Status: DC | PRN

## 2011-07-06 MED ORDER — VALSARTAN 160 MG PO TABS
160.0000 mg | ORAL_TABLET | Freq: Every day | ORAL | Status: DC
  Administered 2011-07-06 – 2011-07-11 (×6): 160 mg via ORAL
  Filled 2011-07-06 (×6): qty 1

## 2011-07-06 MED ORDER — MIDAZOLAM HCL 5 MG/5ML IJ SOLN
INTRAMUSCULAR | Status: DC | PRN
  Administered 2011-07-06: 1 mg via INTRAVENOUS

## 2011-07-06 MED ORDER — EPHEDRINE SULFATE 50 MG/ML IJ SOLN
INTRAMUSCULAR | Status: DC | PRN
  Administered 2011-07-06: 15 mg via INTRAVENOUS

## 2011-07-06 MED ORDER — DEXTROSE 5 % IV SOLN
INTRAVENOUS | Status: DC | PRN
  Administered 2011-07-06: 09:00:00 via INTRAVENOUS

## 2011-07-06 MED ORDER — CEFAZOLIN SODIUM 1-5 GM-% IV SOLN
1.0000 g | Freq: Three times a day (TID) | INTRAVENOUS | Status: AC
  Administered 2011-07-06 – 2011-07-07 (×2): 1 g via INTRAVENOUS
  Filled 2011-07-06 (×2): qty 50

## 2011-07-06 MED ORDER — HYDROCHLOROTHIAZIDE 25 MG PO TABS
25.0000 mg | ORAL_TABLET | Freq: Every day | ORAL | Status: DC
  Administered 2011-07-06 – 2011-07-11 (×6): 25 mg via ORAL
  Filled 2011-07-06 (×6): qty 1

## 2011-07-06 MED ORDER — SODIUM CHLORIDE 0.9 % IJ SOLN: 9.0000 mL | INTRAMUSCULAR | Status: DC | PRN

## 2011-07-06 MED ORDER — SIMVASTATIN 20 MG PO TABS
20.0000 mg | ORAL_TABLET | Freq: Every day | ORAL | Status: DC
  Administered 2011-07-06 – 2011-07-10 (×5): 20 mg via ORAL
  Filled 2011-07-06 (×6): qty 1

## 2011-07-06 MED ORDER — ONDANSETRON HCL 4 MG/2ML IJ SOLN
INTRAMUSCULAR | Status: DC | PRN
  Administered 2011-07-06: 4 mg via INTRAVENOUS

## 2011-07-06 MED ORDER — HYDROCODONE-ACETAMINOPHEN 5-325 MG PO TABS
1.0000 | ORAL_TABLET | ORAL | Status: DC | PRN
  Administered 2011-07-08 (×2): 2 via ORAL
  Filled 2011-07-06 (×2): qty 2

## 2011-07-06 MED ORDER — BACITRACIN ZINC 500 UNIT/GM EX OINT
TOPICAL_OINTMENT | CUTANEOUS | Status: DC | PRN
  Administered 2011-07-06: 1 via TOPICAL

## 2011-07-06 MED ORDER — LACTATED RINGERS IV SOLN
INTRAVENOUS | Status: DC | PRN
  Administered 2011-07-06 (×3): via INTRAVENOUS

## 2011-07-06 MED ORDER — HYDROMORPHONE HCL PF 1 MG/ML IJ SOLN
0.2500 mg | INTRAMUSCULAR | Status: DC | PRN
  Administered 2011-07-06: 0.5 mg via INTRAVENOUS
  Administered 2011-07-06 (×2): 0.25 mg via INTRAVENOUS

## 2011-07-06 MED ORDER — ACETAMINOPHEN 650 MG RE SUPP: 650.0000 mg | RECTAL | Status: DC | PRN

## 2011-07-06 MED ORDER — BACITRACIN 50000 UNITS IM SOLR
INTRAMUSCULAR | Status: AC
  Filled 2011-07-06: qty 1

## 2011-07-06 MED ORDER — VECURONIUM BROMIDE 10 MG IV SOLR
INTRAVENOUS | Status: DC | PRN
  Administered 2011-07-06: 2 mg via INTRAVENOUS
  Administered 2011-07-06: 3 mg via INTRAVENOUS

## 2011-07-06 MED ORDER — DROPERIDOL 2.5 MG/ML IJ SOLN
INTRAMUSCULAR | Status: DC | PRN
  Administered 2011-07-06: 0.625 mg via INTRAVENOUS

## 2011-07-06 MED ORDER — DIPHENHYDRAMINE HCL 50 MG/ML IJ SOLN: 12.5000 mg | Freq: Four times a day (QID) | INTRAMUSCULAR | Status: DC | PRN

## 2011-07-06 MED ORDER — HYDROMORPHONE HCL PF 1 MG/ML IJ SOLN
INTRAMUSCULAR | Status: AC
  Administered 2011-07-06: 0.25 mg via INTRAVENOUS
  Filled 2011-07-06: qty 1

## 2011-07-06 MED ORDER — ONDANSETRON HCL 4 MG/2ML IJ SOLN
4.0000 mg | INTRAMUSCULAR | Status: DC | PRN
  Administered 2011-07-08: 4 mg via INTRAVENOUS
  Filled 2011-07-06: qty 2

## 2011-07-06 MED ORDER — BUPIVACAINE-EPINEPHRINE PF 0.5-1:200000 % IJ SOLN
INTRAMUSCULAR | Status: DC | PRN
  Administered 2011-07-06: 10 mL
  Administered 2011-07-06: 20 mL

## 2011-07-06 MED ORDER — LACTATED RINGERS IV SOLN
INTRAVENOUS | Status: DC
  Administered 2011-07-07 – 2011-07-08 (×2): via INTRAVENOUS

## 2011-07-06 MED ORDER — VALSARTAN-HYDROCHLOROTHIAZIDE 160-25 MG PO TABS: 1.0000 | ORAL_TABLET | ORAL | Status: DC

## 2011-07-06 MED ORDER — ONDANSETRON HCL 4 MG/2ML IJ SOLN: 4.0000 mg | Freq: Once | INTRAMUSCULAR | Status: DC | PRN

## 2011-07-06 MED ORDER — PANTOPRAZOLE SODIUM 40 MG PO TBEC
40.0000 mg | DELAYED_RELEASE_TABLET | Freq: Every day | ORAL | Status: DC
  Administered 2011-07-06 – 2011-07-10 (×5): 40 mg via ORAL
  Filled 2011-07-06 (×5): qty 1

## 2011-07-06 MED ORDER — LIDOCAINE HCL (CARDIAC) 20 MG/ML IV SOLN
INTRAVENOUS | Status: DC | PRN
  Administered 2011-07-06: 50 mg via INTRAVENOUS

## 2011-07-06 MED ORDER — SODIUM CHLORIDE 0.9 % IJ SOLN: 3.0000 mL | INTRAMUSCULAR | Status: DC | PRN

## 2011-07-06 MED ORDER — OXYCODONE-ACETAMINOPHEN 5-325 MG PO TABS
1.0000 | ORAL_TABLET | ORAL | Status: DC | PRN
  Administered 2011-07-06 (×2): 1 via ORAL
  Administered 2011-07-07 – 2011-07-09 (×5): 2 via ORAL
  Administered 2011-07-10: 1 via ORAL
  Administered 2011-07-10 (×2): 2 via ORAL
  Administered 2011-07-10: 1 via ORAL
  Administered 2011-07-10 – 2011-07-11 (×5): 2 via ORAL
  Filled 2011-07-06 (×9): qty 2
  Filled 2011-07-06 (×3): qty 1
  Filled 2011-07-06 (×2): qty 2
  Filled 2011-07-06: qty 1
  Filled 2011-07-06: qty 2

## 2011-07-06 MED ORDER — DIAZEPAM 5 MG PO TABS
5.0000 mg | ORAL_TABLET | Freq: Four times a day (QID) | ORAL | Status: DC | PRN
  Administered 2011-07-08 – 2011-07-10 (×3): 5 mg via ORAL
  Filled 2011-07-06 (×3): qty 1

## 2011-07-06 MED ORDER — SODIUM CHLORIDE 0.9 % IR SOLN
Status: DC | PRN
  Administered 2011-07-06: 10:00:00

## 2011-07-06 MED ORDER — DOCUSATE SODIUM 100 MG PO CAPS
100.0000 mg | ORAL_CAPSULE | Freq: Two times a day (BID) | ORAL | Status: DC
  Administered 2011-07-06 – 2011-07-10 (×10): 100 mg via ORAL
  Filled 2011-07-06 (×10): qty 1

## 2011-07-06 MED ORDER — ADULT MULTIVITAMIN W/MINERALS CH
1.0000 | ORAL_TABLET | Freq: Every day | ORAL | Status: DC
  Administered 2011-07-06 – 2011-07-11 (×6): 1 via ORAL
  Filled 2011-07-06 (×6): qty 1

## 2011-07-06 MED ORDER — SODIUM CHLORIDE 0.9 % IV SOLN
INTRAVENOUS | Status: AC
  Filled 2011-07-06: qty 500

## 2011-07-06 MED ORDER — CARBAMAZEPINE ER 100 MG PO TB12
100.0000 mg | ORAL_TABLET | Freq: Two times a day (BID) | ORAL | Status: DC
  Administered 2011-07-06 – 2011-07-11 (×10): 100 mg via ORAL
  Filled 2011-07-06 (×11): qty 1

## 2011-07-06 MED ORDER — MORPHINE SULFATE (PF) 1 MG/ML IV SOLN
INTRAVENOUS | Status: DC
  Administered 2011-07-06 – 2011-07-07 (×2): via INTRAVENOUS
  Administered 2011-07-07: 7.5 mg via INTRAVENOUS
  Administered 2011-07-07: 1.5 mg via INTRAVENOUS
  Administered 2011-07-07: 13.5 mg via INTRAVENOUS
  Administered 2011-07-07: 16.5 mg via INTRAVENOUS

## 2011-07-06 MED ORDER — ONDANSETRON HCL 4 MG/2ML IJ SOLN: 4.0000 mg | Freq: Four times a day (QID) | INTRAMUSCULAR | Status: DC | PRN

## 2011-07-06 MED ORDER — IPRATROPIUM BROMIDE 0.06 % NA SOLN
2.0000 | Freq: Three times a day (TID) | NASAL | Status: DC
  Administered 2011-07-07 – 2011-07-10 (×8): 2 via NASAL
  Filled 2011-07-06 (×2): qty 15

## 2011-07-06 MED ORDER — 0.9 % SODIUM CHLORIDE (POUR BTL) OPTIME
TOPICAL | Status: DC | PRN
  Administered 2011-07-06: 1000 mL

## 2011-07-06 MED ORDER — ACETAMINOPHEN 10 MG/ML IV SOLN
INTRAVENOUS | Status: AC
  Administered 2011-07-06: 1000 mg via INTRAVENOUS
  Filled 2011-07-06: qty 100

## 2011-07-06 MED ORDER — PHENOL 1.4 % MT LIQD
1.0000 | OROMUCOSAL | Status: DC | PRN
  Filled 2011-07-06: qty 177

## 2011-07-06 MED ORDER — FENTANYL CITRATE 0.05 MG/ML IJ SOLN
INTRAMUSCULAR | Status: DC | PRN
  Administered 2011-07-06: 100 ug via INTRAVENOUS
  Administered 2011-07-06 (×3): 50 ug via INTRAVENOUS
  Administered 2011-07-06: 150 ug via INTRAVENOUS
  Administered 2011-07-06: 100 ug via INTRAVENOUS
  Administered 2011-07-06: 50 ug via INTRAVENOUS

## 2011-07-06 MED ORDER — MENTHOL 3 MG MT LOZG
1.0000 | LOZENGE | OROMUCOSAL | Status: DC | PRN
  Administered 2011-07-07: 3 mg via ORAL
  Filled 2011-07-06: qty 9

## 2011-07-06 SURGICAL SUPPLY — 70 items
APL SKNCLS STERI-STRIP NONHPOA (GAUZE/BANDAGES/DRESSINGS) ×1
BAG DECANTER FOR FLEXI CONT (MISCELLANEOUS) ×2 IMPLANT
BENZOIN TINCTURE PRP APPL 2/3 (GAUZE/BANDAGES/DRESSINGS) ×2 IMPLANT
BLADE SURG ROTATE 9660 (MISCELLANEOUS) IMPLANT
BRUSH SCRUB EZ PLAIN DRY (MISCELLANEOUS) ×2 IMPLANT
BUR ACORN 6.0 (BURR) ×2 IMPLANT
BUR MATCHSTICK NEURO 3.0 LAGG (BURR) ×2 IMPLANT
CANISTER SUCTION 2500CC (MISCELLANEOUS) ×2 IMPLANT
CAP REVERE LOCKING (Cap) ×8 IMPLANT
CLOTH BEACON ORANGE TIMEOUT ST (SAFETY) ×2 IMPLANT
CONT SPEC 4OZ CLIKSEAL STRL BL (MISCELLANEOUS) ×2 IMPLANT
COVER BACK TABLE 24X17X13 BIG (DRAPES) IMPLANT
COVER TABLE BACK 60X90 (DRAPES) ×2 IMPLANT
DRAPE C-ARM 42X72 X-RAY (DRAPES) ×4 IMPLANT
DRAPE LAPAROTOMY 100X72X124 (DRAPES) ×2 IMPLANT
DRAPE POUCH INSTRU U-SHP 10X18 (DRAPES) ×2 IMPLANT
DRAPE SURG 17X23 STRL (DRAPES) ×8 IMPLANT
ELECT BLADE 4.0 EZ CLEAN MEGAD (MISCELLANEOUS) ×2
ELECT REM PT RETURN 9FT ADLT (ELECTROSURGICAL) ×2
ELECTRODE BLDE 4.0 EZ CLN MEGD (MISCELLANEOUS) ×1 IMPLANT
ELECTRODE REM PT RTRN 9FT ADLT (ELECTROSURGICAL) ×1 IMPLANT
GAUZE SPONGE 4X4 16PLY XRAY LF (GAUZE/BANDAGES/DRESSINGS) ×2 IMPLANT
GLOVE BIO SURGEON STRL SZ8.5 (GLOVE) ×2 IMPLANT
GLOVE BIOGEL PI IND STRL 7.5 (GLOVE) IMPLANT
GLOVE BIOGEL PI IND STRL 8 (GLOVE) ×2 IMPLANT
GLOVE BIOGEL PI INDICATOR 7.5 (GLOVE) ×1
GLOVE BIOGEL PI INDICATOR 8 (GLOVE) ×2
GLOVE EXAM NITRILE LRG STRL (GLOVE) IMPLANT
GLOVE EXAM NITRILE MD LF STRL (GLOVE) ×2 IMPLANT
GLOVE EXAM NITRILE XL STR (GLOVE) IMPLANT
GLOVE EXAM NITRILE XS STR PU (GLOVE) IMPLANT
GLOVE SS BIOGEL STRL SZ 8 (GLOVE) ×2 IMPLANT
GLOVE SUPERSENSE BIOGEL SZ 8 (GLOVE)
GLOVE SURG SS PI 7.5 STRL IVOR (GLOVE) ×4 IMPLANT
GLOVE SURG SS PI 8.0 STRL IVOR (GLOVE) ×3 IMPLANT
GLOVE SURG SS PI 8.5 STRL IVOR (GLOVE) ×2
GLOVE SURG SS PI 8.5 STRL STRW (GLOVE) ×2 IMPLANT
GOWN BRE IMP SLV AUR LG STRL (GOWN DISPOSABLE) ×1 IMPLANT
GOWN BRE IMP SLV AUR XL STRL (GOWN DISPOSABLE) ×6 IMPLANT
GOWN STRL REIN 2XL LVL4 (GOWN DISPOSABLE) ×3 IMPLANT
KIT BASIN OR (CUSTOM PROCEDURE TRAY) ×2 IMPLANT
KIT ROOM TURNOVER OR (KITS) ×2 IMPLANT
NEEDLE HYPO 21X1.5 SAFETY (NEEDLE) IMPLANT
NEEDLE HYPO 22GX1.5 SAFETY (NEEDLE) ×2 IMPLANT
NS IRRIG 1000ML POUR BTL (IV SOLUTION) ×2 IMPLANT
PACK FOAM VITOSS 10CC (Orthopedic Implant) ×2 IMPLANT
PACK LAMINECTOMY NEURO (CUSTOM PROCEDURE TRAY) ×2 IMPLANT
PAD ARMBOARD 7.5X6 YLW CONV (MISCELLANEOUS) ×6 IMPLANT
PATTIES SURGICAL .5 X1 (DISPOSABLE) IMPLANT
PUTTY 10ML ACTIFUSE ABX (Putty) ×1 IMPLANT
ROD REVERE 6.35 40MM (Rod) ×2 IMPLANT
SCREW REVERE 6.35 6.5MMX45 (Screw) ×2 IMPLANT
SCREW REVERE 6.5X50MM (Screw) ×2 IMPLANT
SPACER SUSTAIN O 10X10X26 (Screw) ×4 IMPLANT
SPONGE GAUZE 4X4 12PLY (GAUZE/BANDAGES/DRESSINGS) ×2 IMPLANT
SPONGE LAP 4X18 X RAY DECT (DISPOSABLE) IMPLANT
SPONGE NEURO XRAY DETECT 1X3 (DISPOSABLE) IMPLANT
SPONGE SURGIFOAM ABS GEL 100 (HEMOSTASIS) ×2 IMPLANT
STRIP CLOSURE SKIN 1/2X4 (GAUZE/BANDAGES/DRESSINGS) ×2 IMPLANT
SUT VIC AB 1 CT1 18XBRD ANBCTR (SUTURE) ×2 IMPLANT
SUT VIC AB 1 CT1 8-18 (SUTURE) ×4
SUT VIC AB 2-0 CP2 18 (SUTURE) ×4 IMPLANT
SYR 20CC LL (SYRINGE) IMPLANT
SYR 20ML ECCENTRIC (SYRINGE) ×2 IMPLANT
TAPE CLOTH SURG 4X10 WHT LF (GAUZE/BANDAGES/DRESSINGS) ×1 IMPLANT
TOWEL OR 17X24 6PK STRL BLUE (TOWEL DISPOSABLE) ×2 IMPLANT
TOWEL OR 17X26 10 PK STRL BLUE (TOWEL DISPOSABLE) ×2 IMPLANT
TRAY FOLEY BAG SILVER LF 14FR (CATHETERS) ×1 IMPLANT
TRAY FOLEY CATH 14FRSI W/METER (CATHETERS) ×1 IMPLANT
WATER STERILE IRR 1000ML POUR (IV SOLUTION) ×2 IMPLANT

## 2011-07-06 NOTE — Anesthesia Postprocedure Evaluation (Signed)
  Anesthesia Post-op Note  Patient: [name] [name]  Procedure(s) Performed: Procedure(s) (LRB): POSTERIOR LUMBAR FUSION 1 LEVEL (N/A)  Patient Location: PACU  Anesthesia Type: General  Level of Consciousness: awake, alert  and oriented  Airway and Oxygen Therapy: Patient Spontanous Breathing and Patient connected to nasal cannula oxygen  Post-op Pain: mild  Post-op Assessment: Post-op Vital signs reviewed and Patient's Cardiovascular Status Stable  Post-op Vital Signs: stable  Complications: No apparent anesthesia complications

## 2011-07-06 NOTE — Anesthesia Postprocedure Evaluation (Signed)
  Anesthesia Post-op Note  Patient: [name] [name]  Procedure(s) Performed: Procedure(s) (LRB): POSTERIOR LUMBAR FUSION 1 LEVEL (N/A)  Patient Location: PACU  Anesthesia Type: General  Level of Consciousness: awake, alert  and oriented  Airway and Oxygen Therapy: Patient Spontanous Breathing and Patient connected to nasal cannula oxygen  Post-op Pain: mild  Post-op Assessment: Post-op Vital signs reviewed and Patient's Cardiovascular Status Stable  Post-op Vital Signs: stable  Complications: No apparent anesthesia complications 

## 2011-07-06 NOTE — Anesthesia Preprocedure Evaluation (Signed)
Anesthesia Evaluation  Patient identified by MRN, date of birth, ID band Patient awake    Reviewed: Allergy & Precautions, H&P , NPO status , Patient's Chart, lab work & pertinent test results  Airway Mallampati: II TM Distance: >3 FB Neck ROM: Full    Dental  (+) Edentulous Upper and Partial Lower   Pulmonary  breath sounds clear to auscultation        Cardiovascular Rhythm:Regular Rate:Normal     Neuro/Psych    GI/Hepatic   Endo/Other    Renal/GU      Musculoskeletal   Abdominal (+) + obese,  Abdomen: soft.    Peds  Hematology   Anesthesia Other Findings   Reproductive/Obstetrics                           Anesthesia Physical Anesthesia Plan  ASA: III  Anesthesia Plan: General   Post-op Pain Management:    Induction: Intravenous  Airway Management Planned: Oral ETT  Additional Equipment: Arterial line  Intra-op Plan:   Post-operative Plan: Extubation in OR  Informed Consent: I have reviewed the patients History and Physical, chart, labs and discussed the procedure including the risks, benefits and alternatives for the proposed anesthesia with the patient or authorized representative who has indicated his/her understanding and acceptance.   Dental advisory given  Plan Discussed with:   Anesthesia Plan Comments: (PVD Htn Obesity GERD  Plan GA  Kipp Brood, MD)        Anesthesia Quick Evaluation

## 2011-07-06 NOTE — Progress Notes (Signed)
Subjective:  The patient is alert and pleasant. She looks well. She is in no apparent distress.  Objective: Vital signs in last 24 hours: Temp:  [98.3 F (36.8 C)-98.8 F (37.1 C)] 98.8 F (37.1 C) (03/13 1240) Pulse Rate:  [67-90] 90  (03/13 1240) Resp:  [20] 20  (03/13 1240) BP: (156-167)/(56-74) 156/56 mmHg (03/13 1240) SpO2:  [96 %-100 %] 100 % (03/13 1240)  Intake/Output from previous day:   Intake/Output this shift: Total I/O In: 2450 [I.V.:2450] Out: 900 [Urine:500; Blood:400]  Physical exam the patient is alert and oriented. She is moving all 4 extremities well.  Lab Results: No results found for this basename: WBC:2,HGB:2,HCT:2,PLT:2 in the last 72 hours BMET No results found for this basename: NA:2,K:2,CL:2,CO2:2,GLUCOSE:2,BUN:2,CREATININE:2,CALCIUM:2 in the last 72 hours  Studies/Results: No results found.  Assessment/Plan: The patient is doing well.  LOS: 0 days     Matej Sappenfield D 07/06/2011, 12:52 PM

## 2011-07-06 NOTE — Op Note (Signed)
Brief history: The patient is an 78 year old white female who has had chronic back buttocks and leg pain consistent with neurogenic claudication. She has failed medical management was worked up with a lumbar MRI. This demonstrated patient had spinal stenosis at t L2-3 L3-4 and L4-5'with a spondylolisthesis at L4-5. I discussed the various treatment options with the patient including surgery she has weighed the risks benefits and alternative surgery like to proceed with at [id number] decompression with the agitation and fusion L4-5.  Preoperative diagnosis: L2-3, L3-4 and L4-5 Degenerative disc disease, spinal stenosis; lumbago; lumbar radiculopathy, L4-5 spondylolisthesis.  Postoperative diagnosis: The same  Procedure: L3 and L4 laminectomy with bilateral L2 Laminotomy/foraminotomies to decompress the bilateral 2, L3, L4 and L5 nerve roots(the work required to do this was in addition to the work required to do the posterior lumbar interbody fusion because of the patient's spinal stenosis, facet arthropathy. Etc. requiring a wide decompression of the nerve roots.); L4-5 posterior lumbar interbody fusion with local morselized autograft bone and Actifusebone graft extender; insertion of interbody prosthesis at L45 (globus peek interbody prosthesis); posterior nonsegmental instrumentation from L4 to L5 with globus titanium pedicle screws and rods; posterior lateral arthrodesis at L4-5 with local morselized autograft bone and Vitoss bone graft extender.  Surgeon: Dr. Delma Officer  Asst.: Dr. Hilda Lias  Anesthesia: Gen. endotracheal  Estimated blood loss: 400 cc  Drains: None  Locations: None  Description of procedure: The patient was brought to the operating room by the anesthesia team. General endotracheal anesthesia was induced. The patient was turned to the prone position on the Wilson frame. The patient's lumbosacral region was then prepared with Betadine scrub and Betadine solution. Sterile  drapes were applied.  I then injected the area to be incised with Marcaine with epinephrine solution. I then used the scalpel to make a linear midline incision over the L2-3, L3-4 and L4-5 interspace. I then used electrocautery to perform a bilateral subperiosteal dissection exposing the spinous process and lamina of L2 down to L5. We then obtained intraoperative radiograph to confirm our location. We then inserted the Verstrac retractor to provide exposure.  I began the decompression by using the high speed drill to perform laminotomies at L2, L3 and L4. We used a Kerrison punches to complete the laminectomy at L3 and L4 We then used the Kerrison punches to widen the laminotomy at L2 and removed the ligamentum flavum at L2-3, L3-4 and L4-5. We used the Kerrison punches to remove the medial facets at L2-3, L3-4 and L4-5. We performed wide foraminotomies about the bilateral L2, L3, L4 and L5 nerve roots completing the decompression.  We now turned our attention to the posterior lumbar interbody fusion. I used a scalpel to incise the intervertebral disc at L4-5. I then performed a partial intervertebral discectomy at L4-5 using the pituitary forceps. We prepared the vertebral endplates at L4-5 for the fusion by removing the soft tissues with the curettes. We then used the trial spacers to pick the appropriate sized interbody prosthesis. We prefilled his prosthesis with a combination of local morselized autograft bone that we obtained during the decompression as well as Actifuse bone graft extender. We inserted the prefilled prosthesis into the interspace at L4-5. There was a good snug fit of the prosthesis in the interspace. We then filled and the remainder of the intervertebral disc space with local morselized autograft bone and Actifuse. This completed the posterior lumbar interbody arthrodesis.  We now turned attention to the instrumentation. Under fluoroscopic guidance we  cannulated the bilateral L4 and L5  pedicles with the bone probe. We then removed the bone probe. He then tapped the pedicle with a 5.5 millimeter tap. We then removed the tap. We probed inside the tapped pedicle with a ball probe to rule out cortical breaches. We then inserted a 6.5 x 45 and 55 millimeter pedicle screw into the L4 and L5 pedicles bilaterally under fluoroscopic guidance. We then palpated along the medial aspect of the pedicles to rule out cortical breaches. There were none. The nerve roots were not injured. We then connected the unilateral pedicle screws with a lordotic rod. We compressed the construct and secured the rod in place with the caps. We then tightened the caps appropriately. This completed the instrumentation from L4-L5.  We now turned our attention to the posterior lateral arthrodesis at L4-5. We used the high-speed drill to decorticate the remainder of the facets, pars, transverse process at L4-5. We then applied a combination of local morselized autograft bone and Vitoss bone graft extender over these decorticated posterior lateral structures. This completed the posterior lateral arthrodesis.  We then obtained hemostasis using bipolar electrocautery. We irrigated the wound out with bacitracin solution. We inspected the thecal sac and nerve roots and noted they were well decompressed. We then removed the retractor. We reapproximated patient's thoracolumbar fascia with interrupted #1 Vicryl suture. We reapproximated patient's subcutaneous tissue with interrupted 2-0 Vicryl suture. The reapproximated patient's skin with Steri-Strips and benzoin. The wound was then coated with bacitracin ointment. A sterile dressing was applied. The drapes were removed. The patient was subsequently returned to the supine position where they were extubated by the anesthesia team. He was then transported to the post anesthesia care unit in stable condition. All sponge instrument and needle counts were correct at the end of this  case.

## 2011-07-06 NOTE — Preoperative (Signed)
Beta Blockers   Reason not to administer Beta Blockers:Not Applicable 

## 2011-07-06 NOTE — Progress Notes (Signed)
Living Will and Healthcare Power of Attorney placed in chart. Given to Nurse by daughter.

## 2011-07-06 NOTE — H&P (Signed)
Subjective: The patient is an 78 year old white female who complains of chronic back hip and leg pain. She has failed medical management worked up with a lumbar MRI which demonstrated she had spinal stenosis at L2-3, L3-4 and L4-5 with spondylolisthesis at L4-5. I discussed the various treatment with the patient and her family including surgery. The patient has weighed the risks, benefits and alternatives surgery went to proceed with a lumbar decompression and fusion.   Past Medical History  Diagnosis Date  . GERD (gastroesophageal reflux disease)   . Hyperlipidemia   . Hypertension   . History of colonic polyps   . Diverticulosis   . Shingles   . Eczema   . Allergic rhinoconjunctivitis   . Lung nodule 2011  . Environmental allergies     allergy shot- q friday in Dr. Roxy Cedar office. PFT's abnormal- recommended Spiriva to use preop & will d/c after surgery  . Arthritis     low back , stenosis  . Anxiety     pt. managed- uses deep breathing   . Stenosis of popliteal artery     blood clots in legs long ago      Past Surgical History  Procedure Date  . Tubal ligation   . Cholecystectomy 1998  . Femoral-popliteal bypass graft        x2 surgeries 1990's & 2009  . Eye surgery     cataracts removed- bilateral /w IOL  . Tonsillectomy     as a teenager   . Tonsilectomy, adenoidectomy, bilateral myringotomy and tubes     no myringotomy tubes     Allergies  Allergen Reactions  . Latex     REACTION: rash  . Sulfamethoxazole     REACTION: disorientation  . Tiotropium Bromide Other (See Comments)    Sore throat    History  Substance Use Topics  . Smoking status: Former Smoker -- 2.0 packs/day    Quit date: 06/18/1993  . Smokeless tobacco: Not on file   Comment: QUIT IN 1995  . Alcohol Use: No    Family History  Problem Relation Age of Onset  . Arthritis Mother   . Heart disease Father   . Colon polyps Father   . Breast cancer Sister   . Lung cancer Sister   . Colon  cancer Sister   . Lung cancer Brother   . Anesthesia problems Neg Hx   . Hypotension Neg Hx   . Malignant hyperthermia Neg Hx   . Pseudochol deficiency Neg Hx    Prior to Admission medications   Medication Sig Start Date End Date Taking? Authorizing Provider  calcium-vitamin D (OSCAL WITH D 500-200) 500-200 MG-UNIT per tablet Take 1 tablet by mouth daily.     Yes Historical Provider, MD  carbamazepine (CARBATROL) 100 MG 12 hr capsule Take 100 mg by mouth 2 (two) times daily. Takes one at night and may take a second in the morning for pain   Yes Historical Provider, MD  Cholecalciferol (VITAMIN D3) 1000 UNITS CAPS Take 1,000 Units by mouth daily.    Yes Historical Provider, MD  ipratropium (ATROVENT) 0.06 % nasal spray USE 1 TO 2 PUFFS IN EACH NOSTRIL UP TO 3 TIMES A DAY WHEN NEEDED 06/28/11 06/27/12 Yes Waymon Budge, MD  Multiple Vitamin (MULITIVITAMIN WITH MINERALS) TABS Take 1 tablet by mouth daily.   Yes Historical Provider, MD  naproxen (NAPROSYN) 500 MG tablet Take 500 mg by mouth Twice daily as needed. For pain 05/17/11  Yes Historical Provider,  MD  omeprazole (PRILOSEC) 20 MG capsule Take 20 mg by mouth every morning.    Yes Historical Provider, MD  oxyCODONE-acetaminophen (PERCOCET) 10-325 MG per tablet Take 1 tablet by mouth 3 (three) times daily. For pain 06/30/11  Yes Stacie Glaze, MD  simvastatin (ZOCOR) 40 MG tablet TAKE 1 TABLET BY MOUTH ONCE A DAY 06/28/11  Yes Stacie Glaze, MD  valsartan-hydrochlorothiazide (DIOVAN-HCT) 160-25 MG per tablet Take 1 tablet by mouth every morning.    Yes Historical Provider, MD  aspirin EC 81 MG tablet Take 81 mg by mouth daily.    Historical Provider, MD     Review of Systems  Positive ROS: As above  All other systems have been reviewed and were otherwise negative with the exception of those mentioned in the HPI and as above.  Objective: Vital signs in last 24 hours: Temp:  [98.3 F (36.8 C)] 98.3 F (36.8 C) (03/13 0722) Pulse Rate:   [67] 67  (03/13 0722) Resp:  [20] 20  (03/13 0722) BP: (167)/(74) 167/74 mmHg (03/13 0722) SpO2:  [96 %] 96 % (03/13 0722)  General Appearance: Alert, cooperative, no distress, appears stated age Head: Normocephalic, without obvious abnormality, atraumatic Eyes: PERRL, conjunctiva/corneas clear, EOM's intact, fundi benign, both eyes      Ears: Normal TM's and external ear canals, both ears Throat: Lips, mucosa, and tongue normal; teeth and gums normal Neck: Supple, symmetrical, trachea midline, no adenopathy; thyroid: No enlargement/tenderness/nodules; no carotid bruit or JVD Back: Symmetric, no curvature, ROM normal, no CVA tenderness Lungs: Clear to auscultation bilaterally, respirations unlabored Heart: Regular rate and rhythm, S1 and S2 normal, no murmur, rub or gallop Abdomen: Soft, non-tender, bowel sounds active all four quadrants, no masses, no organomegaly Extremities: Extremities normal, atraumatic, no cyanosis or edema Pulses: 2+ and symmetric all extremities Skin: Skin color, texture, turgor normal, no rashes or lesions  NEUROLOGIC:   Mental status: alert and oriented, no aphasia, good attention span, Fund of knowledge/ memory ok Motor Exam - grossly normal Sensory Exam - grossly normal Reflexes:  Coordination - grossly normal Gait - grossly normal Balance - grossly normal Cranial Nerves: I: smell Not tested  II: visual acuity  OS: None    OD: None   II: visual fields Full to confrontation  II: pupils Equal, round, reactive to light  III,VII: ptosis None  III,IV,VI: extraocular muscles  Full ROM  V: mastication Normal  V: facial light touch sensation  Normal  V,VII: corneal reflex  Present  VII: facial muscle function - upper  Normal  VII: facial muscle function - lower Normal  VIII: hearing Not tested  IX: soft palate elevation  Normal  IX,X: gag reflex Present  XI: trapezius strength  5/5  XI: sternocleidomastoid strength 5/5  XI: neck flexion strength  5/5    XII: tongue strength  Normal    Data Review Lab Results  Component Value Date   WBC 13.1* 06/27/2011   HGB 12.9 06/27/2011   HCT 38.0 06/27/2011   MCV 85.0 06/27/2011   PLT 218 06/27/2011   Lab Results  Component Value Date   NA 138 06/27/2011   K 4.6 06/27/2011   CL 98 06/27/2011   CO2 29 06/27/2011   BUN 19 06/27/2011   CREATININE 0.96 06/27/2011   GLUCOSE 106* 06/27/2011   Lab Results  Component Value Date   INR 1.01 04/04/2009    Assessment/Plan: L2-3, L3-4 and L4-5 spinal stenosis, L4-5 spinal stenosis, lumbago lumbar radiography: I discussed situation  with the patient and her family. I reviewed the MR scan with them and pointed out the abnormalities. We have discussed the various treatments including surgery. I discussed the risks, benefits and alternatives as well as the likelihood of achieving our goals with surgery. Advanced all the patient and her family's questions. They have decided proceed with surgery.   [name] [name] D 07/06/2011 8:38 AM

## 2011-07-06 NOTE — Transfer of Care (Signed)
Immediate Anesthesia Transfer of Care Note  Patient: [name] [name]  Procedure(s) Performed: Procedure(s) (LRB): POSTERIOR LUMBAR FUSION 1 LEVEL (N/A)  Patient Location: PACU  Anesthesia Type: General  Level of Consciousness: alert , oriented, sedated, patient cooperative and responds to stimulation  Airway & Oxygen Therapy: Patient Spontanous Breathing and Patient connected to nasal cannula oxygen  Post-op Assessment: Report given to PACU RN, Post -op Vital signs reviewed and stable, Patient moving all extremities and Patient moving all extremities X 4  Post vital signs: Reviewed and stable  Complications: No apparent anesthesia complications

## 2011-07-06 NOTE — Anesthesia Procedure Notes (Signed)
Procedure Name: Intubation Date/Time: 07/06/2011 8:59 AM Performed by: Wray Kearns A Pre-anesthesia Checklist: Patient identified, Timeout performed, Emergency Drugs available, Suction available and Patient being monitored Patient Re-evaluated:Patient Re-evaluated prior to inductionOxygen Delivery Method: Circle system utilized Preoxygenation: Pre-oxygenation with 100% oxygen Intubation Type: IV induction and Cricoid Pressure applied Ventilation: Mask ventilation without difficulty and Oral airway inserted - appropriate to patient size Laryngoscope Size: Mac and 3 Grade View: Grade I Tube type: Oral Tube size: 7.5 mm Number of attempts: 1 Airway Equipment and Method: Stylet and LTA kit utilized Placement Confirmation: ETT inserted through vocal cords under direct vision,  positive ETCO2,  CO2 detector and breath sounds checked- equal and bilateral Secured at: 22 cm Tube secured with: Tape Dental Injury: Teeth and Oropharynx as per pre-operative assessment

## 2011-07-07 ENCOUNTER — Ambulatory Visit: Payer: Medicare Other | Admitting: Internal Medicine

## 2011-07-07 LAB — CBC
MCV: 86 fL (ref 78.0–100.0)
Platelets: 172 10*3/uL (ref 150–400)
RDW: 13.9 % (ref 11.5–15.5)
WBC: 10.8 10*3/uL — ABNORMAL HIGH (ref 4.0–10.5)

## 2011-07-07 LAB — BASIC METABOLIC PANEL
Chloride: 101 mEq/L (ref 96–112)
Creatinine, Ser: 0.85 mg/dL (ref 0.50–1.10)
GFR calc Af Amer: 71 mL/min — ABNORMAL LOW (ref >90)

## 2011-07-07 MED ORDER — MORPHINE SULFATE (PF) 1 MG/ML IV SOLN
INTRAVENOUS | Status: AC
  Filled 2011-07-07: qty 25

## 2011-07-07 NOTE — Evaluation (Signed)
Physical Therapy Evaluation Patient Details Name: ELLYN [name] MRN: [id number] DOB: Dec 04, 1924 Today's Date: 07/07/2011  Problem List:  Patient Active Problem List  Diagnoses  . POSTHERPETIC NEURALGIA  . COLONIC POLYPS, RECURRENT  . IMPAIRED GLUCOSE TOLERANCE  . HYPERLIPIDEMIA  . ANXIETY STATE NEC  . CONJUNCTIVITIS, ALLERGIC  . HYPERTENSION  . PERIPHERAL VASCULAR DISEASE  . CHRONIC OBSTRUCTIVE PULMONARY DISEASE  . PULMONARY NODULE, RIGHT LOWER LOBE  . ESOPHAGEAL STRICTURE  . COLITIS  . DIVERTICULOSIS, COLON  . ECZEMA  . HIP PAIN, LEFT  . OSTEOPOROSIS  . HIATAL HERNIA, HX OF  . TOBACCO ABUSE, HX OF  . Allergic rhinitis due to pollen  . Lumbar pain with radiation down right leg    Past Medical History:  Past Medical History  Diagnosis Date  . GERD (gastroesophageal reflux disease)   . Hyperlipidemia   . Hypertension   . History of colonic polyps   . Diverticulosis   . Shingles   . Eczema   . Allergic rhinoconjunctivitis   . Lung nodule 2011  . Environmental allergies     allergy shot- q friday in Dr. Roxy Cedar office. PFT's abnormal- recommended Spiriva to use preop & will d/c after surgery  . Arthritis     low back , stenosis  . Anxiety     pt. managed- uses deep breathing   . Stenosis of popliteal artery     blood clots in legs long ago    . COPD (chronic obstructive pulmonary disease)   . H/O hiatal hernia    Past Surgical History:  Past Surgical History  Procedure Date  . Tubal ligation   . Cholecystectomy 1998  . Femoral-popliteal bypass graft        x2 surgeries 1990's & 2009  . Eye surgery     cataracts removed- bilateral /w IOL  . Tonsillectomy     as a teenager   . Tonsilectomy, adenoidectomy, bilateral myringotomy and tubes     no myringotomy tubes   . Lumbar fusion 07/06/2011    PT Assessment/Plan/Recommendation PT Assessment Clinical Impression Statement: 78 year old pt s/p lumbar fusion. Pt presents with generalized weakness, decreased  independence compared to prior level of function. Pt decided prior to surgery for SNF stay at D/C as she currently lives alone and will not have support at home. Pt mobilizing well. PT Recommendation/Assessment: Patient will need skilled PT in the acute care venue PT Problem List: Decreased activity tolerance;Decreased balance;Decreased mobility;Decreased knowledge of use of DME Barriers to Discharge: Decreased caregiver support PT Therapy Diagnosis : Difficulty walking;Abnormality of gait PT Plan PT Frequency: Min 4X/week PT Treatment/Interventions: DME instruction;Gait training;Functional mobility training;Therapeutic activities;Therapeutic exercise;Balance training;Patient/family education PT Recommendation Follow Up Recommendations: Skilled nursing facility Equipment Recommended: Defer to next venue PT Goals  Acute Rehab PT Goals PT Goal Formulation: With patient Time For Goal Achievement: 7 days Pt will Roll Supine to Right Side: with modified independence PT Goal: Rolling Supine to Right Side - Progress: Goal set today Pt will Roll Supine to Left Side: with modified independence PT Goal: Rolling Supine to Left Side - Progress: Goal set today Pt will go Supine/Side to Sit: with modified independence PT Goal: Supine/Side to Sit - Progress: Goal set today Pt will go Sit to Supine/Side: with modified independence PT Goal: Sit to Supine/Side - Progress: Goal set today Pt will go Sit to Stand: with modified independence PT Goal: Sit to Stand - Progress: Goal set today Pt will go Stand to Sit: with modified  independence PT Goal: Stand to Sit - Progress: Goal set today Pt will Ambulate: >150 feet;with modified independence;with least restrictive assistive device PT Goal: Ambulate - Progress: Goal set today Additional Goals Additional Goal #1: Verbalize 3/3 back precautions, demonstrate adherence to precautions with </= 1 verbal cue. PT Goal: Additional Goal #1 - Progress: Goal set  today  PT Evaluation Precautions/Restrictions  Precautions Precautions: Back Precaution Booklet Issued: Yes (comment) Precaution Comments: Pt educated and received handout on back safety precautions. Required Braces or Orthoses: Yes Spinal Brace: Lumbar corset;Applied in sitting position Restrictions Weight Bearing Restrictions: No Prior Functioning  Home Living Lives With: Alone Type of Home: House Home Layout: One level Home Access: Stairs to enter [employer] of Steps: 1 Bathroom Shower/Tub: Psychologist, counselling;Door [employer] Toilet: Standard Bathroom Accessibility: Yes How Accessible: Accessible via walker Home Adaptive Equipment: None Additional Comments: Pt planning to go to SNF prior to return home Prior Function Level of Independence: Independent with basic ADLs;Independent with gait Driving: Yes 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 Cognition  WFL Sensation/Coordination Sensation Light Touch: Appears Intact (bil. LEs to light touch) Proprioception: Appears Intact Coordination Gross Motor Movements are Fluid and Coordinated: Yes Fine Motor Movements are Fluid and Coordinated: Yes Extremity Assessment RLE Assessment RLE Assessment: Exceptions to [facility] RLE AROM (degrees) Overall AROM Right Lower Extremity: Within functional limits for tasks assessed RLE Strength RLE Overall Strength: Deficits RLE Overall Strength Comments: Generalized deconditioniong, Grossly >/= 4-/5 LLE Assessment LLE Assessment: Exceptions to WFL LLE AROM (degrees) Overall AROM Left Lower Extremity: Within functional limits for tasks assessed LLE Strength LLE Overall Strength: Deficits LLE Overall Strength Comments: Generalized deconditioniong, Grossly >/= 4-/5 Mobility (including Balance) Bed Mobility Bed Mobility: Yes Rolling Right: 5: Supervision;With rail Rolling Right Details (indicate cue type and  reason): Cues for log roll technique, no physical assist needed.  Right Sidelying to Sit: With rails;HOB flat;Other (comment) (min guard assist) Right Sidelying to Sit Details (indicate cue type and reason): min guard assist to trunk for safety Sitting - Scoot to Edge of Bed: 5: Supervision Sitting - Scoot to Edge of Bed Details (indicate cue type and reason): Cues for safe sequencing Transfers Transfers: Yes Sit to Stand: 4: Min assist;From bed;With upper extremity assist Sit to Stand Details (indicate cue type and reason): cueing for sequencing and for safe hand placement  Stand to Sit: 4: Min assist;To chair/3-in-1;With armrests;With upper extremity assist Stand to Sit Details: cueing for safe hand placement and to control descent  Ambulation/Gait Ambulation/Gait: Yes Ambulation/Gait Assistance: Other (comment) (min-guard assist) Ambulation/Gait Assistance Details (indicate cue type and reason): Cues for deep breathing secondary to occasional dips in SpO2 level. Cues for positioning and proximity of RW.  Ambulation Distance (Feet): 300 Feet Assistive device: Rolling walker Gait Pattern: Step-through pattern;Trunk flexed;Decreased stride length (decreased foot clearance bilaterally)  Posture/Postural Control Posture/Postural Control: No significant limitations Balance Balance Assessed: No End of Session PT - End of Session Equipment Utilized During Treatment: Gait belt;Back brace Activity Tolerance: Patient tolerated treatment well Patient left: in chair Nurse Communication: Mobility status for ambulation General Behavior During Session: [facility] for tasks performed Cognition: [facility] for tasks performed  Wilhemina Bonito 07/07/2011, 4:53 PM  Sherie Don) Carleene Mains PT, DPT Acute Rehabilitation [phone]

## 2011-07-07 NOTE — Progress Notes (Signed)
Occupational Therapy Evaluation Patient Details Name: [name] [name] MRN: [id number] DOB: 1924/12/18 Today's Date: 07/07/2011  Problem List:  Patient Active Problem List  Diagnoses  . POSTHERPETIC NEURALGIA  . COLONIC POLYPS, RECURRENT  . IMPAIRED GLUCOSE TOLERANCE  . HYPERLIPIDEMIA  . ANXIETY STATE NEC  . CONJUNCTIVITIS, ALLERGIC  . HYPERTENSION  . PERIPHERAL VASCULAR DISEASE  . CHRONIC OBSTRUCTIVE PULMONARY DISEASE  . PULMONARY NODULE, RIGHT LOWER LOBE  . ESOPHAGEAL STRICTURE  . COLITIS  . DIVERTICULOSIS, COLON  . ECZEMA  . HIP PAIN, LEFT  . OSTEOPOROSIS  . HIATAL HERNIA, HX OF  . TOBACCO ABUSE, HX OF  . Allergic rhinitis due to pollen  . Lumbar pain with radiation down right leg    Past Medical History:  Past Medical History  Diagnosis Date  . GERD (gastroesophageal reflux disease)   . Hyperlipidemia   . Hypertension   . History of colonic polyps   . Diverticulosis   . Shingles   . Eczema   . Allergic rhinoconjunctivitis   . Lung nodule 2011  . Environmental allergies     allergy shot- q friday in Dr. Roxy Cedar office. PFT's abnormal- recommended Spiriva to use preop & will d/c after surgery  . Arthritis     low back , stenosis  . Anxiety     pt. managed- uses deep breathing   . Stenosis of popliteal artery     blood clots in legs long ago    . COPD (chronic obstructive pulmonary disease)   . H/O hiatal hernia    Past Surgical History:  Past Surgical History  Procedure Date  . Tubal ligation   . Cholecystectomy 1998  . Femoral-popliteal bypass graft        x2 surgeries 1990's & 2009  . Eye surgery     cataracts removed- bilateral /w IOL  . Tonsillectomy     as a teenager   . Tonsilectomy, adenoidectomy, bilateral myringotomy and tubes     no myringotomy tubes   . Lumbar fusion 07/06/2011    OT Assessment/Plan/Recommendation OT Assessment Clinical Impression Statement: Pt s/p lumbar fusion thus affecting PLOF.  Will benefit from acute OT in prep  for d/c to SNF. OT Recommendation/Assessment: Patient will need skilled OT in the acute care venue OT Problem List: Decreased activity tolerance;Decreased knowledge of use of DME or AE;Decreased knowledge of precautions;Pain OT Therapy Diagnosis : Acute pain OT Plan OT Frequency: Min 2X/week OT Treatment/Interventions: Self-care/ADL training;DME and/or AE instruction;Therapeutic activities;Patient/family education OT Recommendation Follow Up Recommendations: Skilled nursing facility (pt prefers [facility]) Equipment Recommended: Defer to next venue Individuals Consulted Consulted and Agree with Results and Recommendations: Patient OT Goals Acute Rehab OT Goals OT Goal Formulation: With patient Time For Goal Achievement: 7 days ADL Goals Pt Will Perform Grooming: with set-up;Standing at sink ADL Goal: Grooming - Progress: Goal set today Pt Will Perform Lower Body Bathing: with set-up;Sit to stand from chair;Sit to stand from bed;with adaptive equipment ADL Goal: Lower Body Bathing - Progress: Goal set today Pt Will Perform Lower Body Dressing: with set-up;Sit to stand from chair;Sit to stand from bed;with adaptive equipment ADL Goal: Lower Body Dressing - Progress: Goal set today Pt Will Transfer to Toilet: with supervision;with DME;Ambulation;3-in-1;Maintaining back safety precautions ADL Goal: Toilet Transfer - Progress: Goal set today  OT Evaluation Precautions/Restrictions  Precautions Precautions: Back Precaution Booklet Issued: Yes (comment) Precaution Comments: Pt educated and received handout on back safety precautions. Required Braces or Orthoses: Yes Spinal Brace: Lumbar corset;Applied in sitting  position Restrictions Weight Bearing Restrictions: No Prior Functioning Home Living Lives With: Alone Type of Home: House Home Layout: One level Home Access: Stairs to enter [employer] of Steps: 1 Bathroom Shower/Tub: Psychologist, counselling;Door [employer] Toilet:  Standard Bathroom Accessibility: Yes How Accessible: Accessible via walker Home Adaptive Equipment: None Additional Comments: Pt planning to go to SNF prior to return home Prior Function Level of Independence: Independent with basic ADLs;Independent with gait Driving: Yes 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 ADL ADL Grooming: Simulated;Wash/dry face;Set up Where Assessed - Grooming: Sitting, chair Lower Body Bathing: Simulated;Minimal assistance Lower Body Bathing Details (indicate cue type and reason): min assist for steadying when standing.  cueing for technique while adhering to back precautions. Where Assessed - Lower Body Bathing: Sit to stand from bed Lower Body Dressing: Simulated;Minimal assistance Lower Body Dressing Details (indicate cue type and reason): min assist for steadying when standing. cueing for technique while adhering to back precautions Where Assessed - Lower Body Dressing: Sit to stand from bed Toilet Transfer: Simulated;Minimal assistance Toilet Transfer Details (indicate cue type and reason): bed to chair Toilet Transfer Method: Ambulating Toilet Transfer Equipment: Other (comment) (chair) Equipment Used: Rolling walker Ambulation Related to ADLs: cueing to maintain back precautions Vision/Perception    Cognition Cognition Arousal/Alertness: Awake/alert Overall Cognitive Status: Appears within functional limits for tasks assessed Orientation Level: Oriented X4 Sensation/Coordination Sensation Light Touch: Appears Intact (bil. LEs to light touch) Proprioception: Appears Intact Coordination Gross Motor Movements are Fluid and Coordinated: Yes Fine Motor Movements are Fluid and Coordinated: Yes Extremity Assessment RUE Assessment RUE Assessment: Within Functional Limits LUE Assessment LUE Assessment: Within Functional Limits Mobility  Bed Mobility Bed Mobility:  Yes Rolling Right: 5: Supervision;With rail Rolling Right Details (indicate cue type and reason): cueing for safe technique Right Sidelying to Sit: With rails;HOB flat;Other (comment) (min guard assist) Right Sidelying to Sit Details (indicate cue type and reason): min guard assist to trunk for safety with transition OOB Sitting - Scoot to Edge of Bed: 5: Supervision Transfers Transfers: Yes Sit to Stand: 4: Min assist;From bed;With upper extremity assist Sit to Stand Details (indicate cue type and reason): cueing for sequencing and for safe hand placement Stand to Sit: 4: Min assist;To chair/3-in-1;With armrests;With upper extremity assist Stand to Sit Details: cueing for safe hand placement and to control descent Exercises   End of Session OT - End of Session Equipment Utilized During Treatment: Back brace;Gait belt Activity Tolerance: Patient tolerated treatment well Patient left: in chair;with call bell in reach;with family/visitor present Nurse Communication: Mobility status for transfers General Behavior During Session: [facility] for tasks performed Cognition: [facility] for tasks performed  11:35 AM  07/07/2011 Cipriano Mile OTR/L Pager [phone] Office [phone]

## 2011-07-07 NOTE — Progress Notes (Signed)
UR COMPLETED  

## 2011-07-07 NOTE — Progress Notes (Signed)
Patient ID: [name] [name], female   DOB: Sep 07, 1924, 78 y.o.   MRN: [id number] Subjective:  The patient is alert and pleasant. She looks well. She feels better already.  Objective: Vital signs in last 24 hours: Temp:  [97.4 F (36.3 C)-98.8 F (37.1 C)] 97.9 F (36.6 C) (03/14 0628) Pulse Rate:  [63-90] 77  (03/14 0628) Resp:  [13-20] 19  (03/14 0628) BP: (96-156)/(45-74) 96/57 mmHg (03/14 0628) SpO2:  [92 %-100 %] 100 % (03/14 0628) Weight:  [77.293 kg (170 lb 6.4 oz)] 77.293 kg (170 lb 6.4 oz) (03/13 2130)  Intake/Output from previous day: 03/13 0701 - 03/14 0700 In: 2933 [P.O.:480; I.V.:2453] Out: 3150 [Urine:2750; Blood:400] Intake/Output this shift:    Physical exam the patient is alert and oriented. She is moving all 4 extremities well.  Lab Results:  [facility] 07/07/11 0635  WBC 10.8*  HGB 10.3*  HCT 31.2*  PLT 172   BMET  Basename 07/07/11 0635  NA 138  K 4.0  CL 101  CO2 29  GLUCOSE 118*  BUN 17  CREATININE 0.85  CALCIUM 8.8    Studies/Results: Dg Lumbar Spine 2-3 Views  07/06/2011  *RADIOLOGY REPORT*  Clinical Data: L4-5 PLIF  LUMBAR SPINE - 2-3 VIEW  Comparison: Same day  Findings: Two C-arm images show discectomy and fusion at L4-5. Interbody fusion material is in place.  There are bilateral pedicle screws at L4 and L5.  Posterior rods are not yet in position.  IMPRESSION: PLIF L4-5.  Original Report Authenticated By: Thomasenia Sales, M.D.   Dg Lumbar Spine 1 View  07/06/2011  *RADIOLOGY REPORT*  Clinical Data: L2-L5 laminectomies with L4-5 PLIF for severe diffuse multifactorial spinal stenosis.  OPERATIVE LUMBAR SPINE - 1 VIEW 07/06/2011:  Comparison: MRI lumbar spine 01/18/2011 [facility].  Findings: The same numbering scheme will be utilized as on the prior MRI, with L5 representing the last lumbar segment.  Single lateral image obtained at 0930 hours and submitted for interpretation post-operatively demonstrates the localizer device posterior  to L4, directed toward the L3-4 disc space.  IMPRESSION: L4 localized intraoperatively.  Original Report Authenticated By: Arnell Sieving, M.D.    Assessment/Plan: Postop day 1: The patient is doing well. We will mobilize her with PT and OT.  LOS: 1 day     Trenna Kiely D 07/07/2011, 8:00 AM

## 2011-07-08 MED ORDER — MORPHINE SULFATE 2 MG/ML IJ SOLN: 2.0000 mg | INTRAMUSCULAR | Status: DC | PRN

## 2011-07-08 NOTE — Progress Notes (Signed)
Physical Therapy Treatment Patient Details Name: [name] [name] MRN: [id number] DOB: Aug 12, 1924 Today's Date: 07/08/2011  PT Assessment/Plan  PT - Assessment/Plan Comments on Treatment Session: Pt not feeling well upon entry and did not want to ambulate in the hallway. Assist pt back to bed and into bed. Spoke with pt and family regarding events of this morning and disappointment with meds; RN and Director aware. Will attempt increased ambulation tomorrow. PT Plan: Discharge plan remains appropriate;Frequency remains appropriate PT Frequency: Min 4X/week Follow Up Recommendations: Skilled nursing facility Equipment Recommended: Defer to next venue PT Goals  Acute Rehab PT Goals PT Goal Formulation: With patient PT Goal: Sit to Supine/Side - Progress: Progressing toward goal PT Goal: Sit to Stand - Progress: Progressing toward goal PT Goal: Stand to Sit - Progress: Progressing toward goal PT Goal: Ambulate - Progress: Progressing toward goal Additional Goals PT Goal: Additional Goal #1 - Progress: Progressing toward goal  PT Treatment Precautions/Restrictions  Precautions Precautions: Back Precaution Booklet Issued: Yes (comment) Precaution Comments: Pt able to verbalize 2/3 back precautions. Required Braces or Orthoses: Yes Spinal Brace: Lumbar corset;Applied in sitting position Restrictions Weight Bearing Restrictions: No Mobility (including Balance) Bed Mobility Bed Mobility: Yes Rolling Right: 5: Supervision;With rail Rolling Right Details (indicate cue type and reason): cueing for technique Right Sidelying to Sit: 5: Supervision;With rails Right Sidelying to Sit Details (indicate cue type and reason): cueing to maintain back precautions Sitting - Scoot to Edge of Bed: 5: Supervision Sitting - Scoot to Edge of Bed Details (indicate cue type and reason): supervision for safety Sit to Supine: 4: Min assist;HOB elevated (comment degrees);With rail Sit to Supine - Details  (indicate cue type and reason): VC for sequencing. Min assist with LEs into bed. VC to maintain back precautions throughout Transfers Transfers: Yes Sit to Stand: 4: Min assist;From chair/3-in-1;With upper extremity assist Sit to Stand Details (indicate cue type and reason): VC for hand placement and anterior translation to maintain back precautions Stand to Sit: 4: Min assist;With upper extremity assist;To bed Stand to Sit Details: VC for hand placement. Pt able to control descent with min assist Ambulation/Gait Ambulation/Gait: Yes Ambulation/Gait Assistance: 5: Supervision Ambulation/Gait Assistance Details (indicate cue type and reason): VC for safety with RW. Short ambulation from chair to bed Ambulation Distance (Feet): 3 Feet Assistive device: Rolling walker Gait Pattern: Step-through pattern;Trunk flexed;Decreased stride length    Exercise    End of Session PT - End of Session Equipment Utilized During Treatment: Gait belt;Back brace Activity Tolerance: Patient limited by fatigue;Patient limited by pain Patient left: in bed;with call bell in reach;with family/visitor present Nurse Communication: Mobility status for transfers General Behavior During Session: [facility] for tasks performed Cognition: [facility] for tasks performed  Milana Kidney 07/08/2011, 4:28 PM  07/08/2011 Milana Kidney DPT PAGER: [phone] OFFICE: [phone]

## 2011-07-08 NOTE — Progress Notes (Signed)
Occupational Therapy Treatment Patient Details Name: [name] [name] MRN: [id number] DOB: 1924-05-29 Today's Date: 07/08/2011  OT Assessment/Plan OT Assessment/Plan Comments on Treatment Session: Pt limited today due to fatigue and nausea. OT Plan: Discharge plan remains appropriate OT Frequency: Min 2X/week Follow Up Recommendations: Skilled nursing facility Equipment Recommended: Defer to next venue OT Goals ADL Goals Pt Will Perform Lower Body Dressing: with set-up;Sit to stand from chair;Sit to stand from bed;with adaptive equipment ADL Goal: Lower Body Dressing - Progress: Progressing toward goals Pt Will Transfer to Toilet: with supervision;with DME;Ambulation;3-in-1;Maintaining back safety precautions ADL Goal: Toilet Transfer - Progress: Progressing toward goals  OT Treatment Precautions/Restrictions  Precautions Precautions: Back Precaution Booklet Issued: Yes (comment) Precaution Comments: Pt able to verbalize 2/3 back precautions. Required Braces or Orthoses: Yes Spinal Brace: Lumbar corset;Applied in sitting position Restrictions Weight Bearing Restrictions: No   ADL ADL Lower Body Dressing: Performed;Minimal assistance Lower Body Dressing Details (indicate cue type and reason): min assist to thread Lt. sock over toes. Pt able to don R sock with mod I. Where Assessed - Lower Body Dressing: Sit to stand from bed Toilet Transfer: Performed;Minimal assistance Toilet Transfer Details (indicate cue type and reason): cues for safety and technique Toilet Transfer Method: Stand pivot Toilet Transfer Equipment: Bedside commode Toileting - Clothing Manipulation: Performed;Supervision/safety Toileting - Clothing Manipulation Details (indicate cue type and reason): supervision for safety Where Assessed - Toileting Clothing Manipulation: Standing Toileting - Hygiene: Performed;Supervision/safety Toileting - Hygiene Details (indicate cue type and reason): supervision for  safety Where Assessed - Toileting Hygiene: Standing Equipment Used: Rolling walker Mobility  Bed Mobility Bed Mobility: Yes Rolling Right: 5: Supervision;With rail Rolling Right Details (indicate cue type and reason): cueing for technique Right Sidelying to Sit: 5: Supervision;With rails Right Sidelying to Sit Details (indicate cue type and reason): cueing to maintain back precautions Sitting - Scoot to Edge of Bed: 5: Supervision Sitting - Scoot to Edge of Bed Details (indicate cue type and reason): supervision for safety Transfers Transfers: Yes Sit to Stand: 4: Min assist;From bed;From chair/3-in-1;With upper extremity assist;With armrests Sit to Stand Details (indicate cue type and reason): cues for safe hand placement Stand to Sit: With armrests;To chair/3-in-1;With upper extremity assist;Other (comment) (min guard assist) Stand to Sit Details: cues to control descent Exercises    End of Session OT - End of Session Equipment Utilized During Treatment: Back brace;Gait belt Activity Tolerance: Patient limited by fatigue;Other (comment) (nausea) Patient left: in chair;with call bell in reach Nurse Communication: Other (comment) (pt's nausea) General Behavior During Session: [facility] for tasks performed Cognition: [facility] for tasks performed   2:44 PM 07/08/2011 Cipriano Mile OTR/L Pager [phone] Office [phone]

## 2011-07-08 NOTE — Progress Notes (Signed)
Patient ID: [name] [name], female   DOB: [birth year], 78 y.o.   MRN: [id number] Subjective:  The patient is alert and pleasant. She looks great.  Objective: Vital signs in last 24 hours: Temp:  [97.9 F (36.6 C)-99.6 F (37.6 C)] 98.9 F (37.2 C) (03/15 0600) Pulse Rate:  [64-84] 84  (03/15 0600) Resp:  [17-20] 18  (03/15 0600) BP: (89-121)/(49-66) 102/65 mmHg (03/15 0600) SpO2:  [95 %-98 %] 96 % (03/15 0600)  Intake/Output from previous day:   Intake/Output this shift:    Physical exam the patient is alert and oriented. Her lower extremity strength is normal.  Lab Results:  [facility] 07/07/11 0635  WBC 10.8*  HGB 10.3*  HCT 31.2*  PLT 172   BMET  Basename 07/07/11 0635  NA 138  K 4.0  CL 101  CO2 29  GLUCOSE 118*  BUN 17  CREATININE 0.85  CALCIUM 8.8    Studies/Results: Dg Lumbar Spine 2-3 Views  07/06/2011  *RADIOLOGY REPORT*  Clinical Data: L4-5 PLIF  LUMBAR SPINE - 2-3 VIEW  Comparison: Same day  Findings: Two C-arm images show discectomy and fusion at L4-5. Interbody fusion material is in place.  There are bilateral pedicle screws at L4 and L5.  Posterior rods are not yet in position.  IMPRESSION: PLIF L4-5.  Original Report Authenticated By: [name] [name], M.D.   Dg Lumbar Spine 1 View  07/06/2011  *RADIOLOGY REPORT*  Clinical Data: L2-L5 laminectomies with L4-5 PLIF for severe diffuse multifactorial spinal stenosis.  OPERATIVE LUMBAR SPINE - 1 VIEW 07/06/2011:  Comparison: MRI lumbar spine 01/18/2011 [facility].  Findings: The same numbering scheme will be utilized as on the prior MRI, with L5 representing the last lumbar segment.  Single lateral image obtained at 0930 hours and submitted for interpretation post-operatively demonstrates the localizer device posterior to L4, directed toward the L3-4 disc space.  IMPRESSION: L4 localized intraoperatively.  Original Report Authenticated By: [name] [name], M.D.    Assessment/Plan: Postop day #2: The  patient is doing very well. She has arrangements are made to go to [place]  place for rehabilitation. She will likely be transferred there this weekend. I have given all her instructions and answered all questions.  LOS: 2 days     [name] [name] D 07/08/2011, 7:48 AM

## 2011-07-08 NOTE — Progress Notes (Signed)
Clinical Social Work Department BRIEF PSYCHOSOCIAL ASSESSMENT 07/08/2011  Patient:  SHAQUILLE, MURDY     Account Number:  [id number]     Admit date:  07/06/2011  Clinical Social Worker:  Doreene Nest  Date/Time:  07/08/2011 12:30 PM  Referred by:  Physician  Date Referred:  07/08/2011 Referred for  SNF Placement   Other Referral:   Interview type:  Patient Other interview type:    PSYCHOSOCIAL DATA Living Status:  ALONE Admitted from facility:   Level of care:   Primary support name:  Venetia Prewitt Primary support relationship to patient:  CHILD, ADULT Degree of support available:   good support    CURRENT CONCERNS Current Concerns  Post-Acute Placement   Other Concerns:    SOCIAL WORK ASSESSMENT / PLAN CSW spoke with pt about d/c plan.  Pt has already spoken with a SNF and has made accomadations.  Spoke with SNF and confirmed d/c plan.  CSW will complete apporpriate paperwork and continue to follow to assist with d/c planning.   Assessment/plan status:  Psychosocial Support/Ongoing Assessment of Needs Other assessment/ plan:   Information/referral to community resources:    PATIENT'S/FAMILY'S RESPONSE TO PLAN OF CARE: Pt was familiar with SNF and has been there previously.  Pt was grateful CSW could help with transition.

## 2011-07-08 NOTE — Progress Notes (Signed)
OT Cancellation Note  Pt declined OT treatment this AM due as she had just recently returned to bed.  Pt agreeable to working with OT later today.  Will re-attempt.  Thanks.  07/08/2011 Cipriano Mile OTR/L Pager [phone] Office [phone]

## 2011-07-09 MED ORDER — FLEET ENEMA 7-19 GM/118ML RE ENEM
1.0000 | ENEMA | Freq: Every day | RECTAL | Status: DC | PRN
  Administered 2011-07-09: 1 via RECTAL
  Filled 2011-07-09: qty 1

## 2011-07-09 NOTE — Progress Notes (Signed)
Physical Therapy Treatment Patient Details Name: [name] [name] MRN: [id number] DOB: [birth year] Today's Date: 07/09/2011  PT Assessment/Plan  PT - Assessment/Plan Comments on Treatment Session: Pt progressing well this session, increased ambulation distance and decreased assistance needed throughout mobility. Continue per plan PT Plan: Discharge plan remains appropriate;Frequency remains appropriate PT Frequency: Min 4X/week Follow Up Recommendations: Skilled nursing facility Equipment Recommended: Defer to next venue PT Goals  Acute Rehab PT Goals PT Goal Formulation: With patient PT Goal: Rolling Supine to Right Side - Progress: Progressing toward goal PT Goal: Rolling Supine to Left Side - Progress: Progressing toward goal PT Goal: Supine/Side to Sit - Progress: Progressing toward goal PT Goal: Sit to Supine/Side - Progress: Progressing toward goal PT Goal: Sit to Stand - Progress: Progressing toward goal PT Goal: Stand to Sit - Progress: Progressing toward goal PT Goal: Ambulate - Progress: Progressing toward goal Additional Goals PT Goal: Additional Goal #1 - Progress: Progressing toward goal  PT Treatment Precautions/Restrictions  Precautions Precautions: Back Precaution Booklet Issued: Yes (comment) Precaution Comments: Pt able to verbalize 2/3 back precautions. Pt demonstrated awareness of all 3 precautions throughout session Required Braces or Orthoses: Yes Spinal Brace: Lumbar corset;Applied in sitting position Restrictions Weight Bearing Restrictions: No Mobility (including Balance) Bed Mobility Bed Mobility: Yes Rolling Right: 5: Supervision;With rail Rolling Right Details (indicate cue type and reason): VC for technique and hand placement Right Sidelying to Sit: 4: Min assist;With rails;HOB elevated (comment degrees) Right Sidelying to Sit Details (indicate cue type and reason): VC for technique and hand placement to maintain back precautions. Min assist for  initiation of movement Sitting - Scoot to Edge of Bed: 5: Supervision Sitting - Scoot to [place] of Bed Details (indicate cue type and reason): VC for weight shifting Transfers Transfers: Yes Sit to Stand: 4: Min assist;From bed;With upper extremity assist;From chair/3-in-1 Sit to Stand Details (indicate cue type and reason): VC for hand placement and sequencing. Min assist for stability into standing Stand to Sit: 5: Supervision;With upper extremity assist;To chair/3-in-1 Stand to Sit Details: VC for hand placement. Pt able to control descent without any physical assist Ambulation/Gait Ambulation/Gait: Yes Ambulation/Gait Assistance: 5: Supervision Ambulation/Gait Assistance Details (indicate cue type and reason): VC throughout for safe distance to RW as well as sequencing throughout ambulation Ambulation Distance (Feet): 75 Feet Assistive device: Rolling walker Gait Pattern: Step-through pattern;Trunk flexed;Decreased stride length Gait velocity: decreased gait speed Stairs: No    Exercise    End of Session PT - End of Session Equipment Utilized During Treatment: Gait belt;Back brace Activity Tolerance: Patient tolerated treatment well Patient left: in chair;with call bell in reach;with family/visitor present Nurse Communication: Mobility status for transfers;Mobility status for ambulation General Behavior During Session: [facility] for tasks performed Cognition: [facility] for tasks performed  Milana Kidney 07/09/2011, 12:59 PM  07/09/2011 Milana Kidney DPT PAGER: [phone] OFFICE: [phone]

## 2011-07-09 NOTE — Progress Notes (Signed)
Patient ID: [name] [name], female   DOB: [birth year], 78 y.o.   MRN: [id number] Doing well. No c/o. To snf 07/10/11. No weakness. See orfers

## 2011-07-10 LAB — TYPE AND SCREEN
Antibody Screen: POSITIVE
Donor AG Type: NEGATIVE
PT AG Type: NEGATIVE

## 2011-07-10 NOTE — Progress Notes (Signed)
Filed Vitals:   07/09/11 2352 07/10/11 0213 07/10/11 0728 07/10/11 0900  BP: 132/72 111/66 108/70 113/58  Pulse: 80 72 72 77  Temp: 98.9 F (37.2 C) 98.9 F (37.2 C) 98 F (36.7 C) 98.5 F (36.9 C)  TempSrc: Oral Oral Oral Oral  Resp: 16 18 20 17   Height:      Weight:      SpO2: 92% 93% 96% 95%    Continue to make gradual progress with PT and OT. For transfer tomorrow to [facility].  Plan: Continue postoperative care.  Hewitt Shorts, MD 07/10/2011, 12:29 PM

## 2011-07-11 DIAGNOSIS — K219 Gastro-esophageal reflux disease without esophagitis: Secondary | ICD-10-CM | POA: Diagnosis not present

## 2011-07-11 DIAGNOSIS — K222 Esophageal obstruction: Secondary | ICD-10-CM | POA: Diagnosis not present

## 2011-07-11 DIAGNOSIS — E785 Hyperlipidemia, unspecified: Secondary | ICD-10-CM | POA: Diagnosis not present

## 2011-07-11 DIAGNOSIS — I10 Essential (primary) hypertension: Secondary | ICD-10-CM | POA: Diagnosis not present

## 2011-07-11 DIAGNOSIS — E7800 Pure hypercholesterolemia, unspecified: Secondary | ICD-10-CM | POA: Diagnosis not present

## 2011-07-11 DIAGNOSIS — M48061 Spinal stenosis, lumbar region without neurogenic claudication: Secondary | ICD-10-CM | POA: Diagnosis not present

## 2011-07-11 DIAGNOSIS — Z5189 Encounter for other specified aftercare: Secondary | ICD-10-CM | POA: Diagnosis not present

## 2011-07-11 DIAGNOSIS — M4800 Spinal stenosis, site unspecified: Secondary | ICD-10-CM | POA: Diagnosis not present

## 2011-07-11 DIAGNOSIS — Q7649 Other congenital malformations of spine, not associated with scoliosis: Secondary | ICD-10-CM | POA: Diagnosis not present

## 2011-07-11 DIAGNOSIS — S2239XA Fracture of one rib, unspecified side, initial encounter for closed fracture: Secondary | ICD-10-CM | POA: Diagnosis not present

## 2011-07-11 DIAGNOSIS — R7309 Other abnormal glucose: Secondary | ICD-10-CM | POA: Diagnosis not present

## 2011-07-11 DIAGNOSIS — F411 Generalized anxiety disorder: Secondary | ICD-10-CM | POA: Diagnosis not present

## 2011-07-11 DIAGNOSIS — J449 Chronic obstructive pulmonary disease, unspecified: Secondary | ICD-10-CM | POA: Diagnosis not present

## 2011-07-11 DIAGNOSIS — I739 Peripheral vascular disease, unspecified: Secondary | ICD-10-CM | POA: Diagnosis not present

## 2011-07-11 MED ORDER — DIAZEPAM 2 MG PO TABS: 2.0000 mg | ORAL_TABLET | Freq: Four times a day (QID) | ORAL | Status: AC | PRN

## 2011-07-11 MED ORDER — DSS 100 MG PO CAPS: 100.0000 mg | ORAL_CAPSULE | Freq: Two times a day (BID) | ORAL | Status: AC

## 2011-07-11 MED ORDER — OXYCODONE-ACETAMINOPHEN 10-325 MG PO TABS: 1.0000 | ORAL_TABLET | ORAL | Status: AC | PRN

## 2011-07-11 NOTE — Discharge Summary (Signed)
Physician Discharge Summary  Patient ID: [name] [name] MRN: [id number] DOB/AGE: [birth year] 76 y.o.  Admit date: 07/06/2011 Discharge date: 07/11/2011  Admission Diagnoses: L4-5 spondylolisthesis, spinal stenosis, neurogenic claudication, lumbago, L2-3 and L3-4 stenosis.  Discharge Diagnoses: The same Active Problems:  * No active hospital problems. *    Discharged Condition: good  Hospital Course: I admitted the patient to [facility] on 07/06/2011. On that day performed at L4-5 decompression instrumentation and fusion'with a decompressive laminectomy at L2-3 and L3-4. The surgery went well. Patient's postoperative course was unremarkable and she was transferred to  [facility] on 07/11/2011 at the patient's request.  Consults: None Significant Diagnostic Studies: None Treatments: L4-5 decompression instrumentation and fusion with L2-3 and L3-4 laminectomy. Discharge Exam: Blood pressure 125/71, pulse 69, temperature 98.2 F (36.8 C), temperature source Oral, resp. rate 18, height 5\' 2"  (1.575 m), weight 77.293 kg (170 lb 6.4 oz), SpO2 97.00%. The patient is alert and pleasant. She looks well. Her lower strumming strength is normal.  Disposition: Skilled nursing facility.  Discharge Orders    Future Appointments: Provider: Department: Dept Phone: Center:   08/15/2011 9:45 AM Waymon Budge, MD [facility] [phone] None     Future Orders Please Complete By Expires   Diet - low sodium heart healthy      Increase activity slowly      Discharge instructions      Comments:   Call [phone] for followup appointment.   No dressing needed      Call MD for:  temperature >100.4      Call MD for:  persistant nausea and vomiting      Call MD for:  severe uncontrolled pain      Call MD for:  redness, tenderness, or signs of infection (pain, swelling, redness, odor or green/yellow discharge around incision site)      Call MD for:  difficulty breathing, headache or  visual disturbances      Call MD for:  hives      Call MD for:  persistant dizziness or light-headedness      Call MD for:  extreme fatigue        Medication List  As of 07/11/2011 12:03 PM   STOP taking these medications         naproxen 500 MG tablet         TAKE these medications         aspirin EC 81 MG tablet   Take 81 mg by mouth daily.      calcium-vitamin D 500-200 MG-UNIT per tablet   Commonly known as: OSCAL WITH D   Take 1 tablet by mouth daily.      carbamazepine 100 MG 12 hr capsule   Commonly known as: CARBATROL   Take 100 mg by mouth 2 (two) times daily. Takes one at night and may take a second in the morning for pain      diazepam 2 MG tablet   Commonly known as: VALIUM   Take 1 tablet (2 mg total) by mouth every 6 (six) hours as needed for anxiety (Muscle spasms).      DSS 100 MG Caps   Take 100 mg by mouth 2 (two) times daily.      ipratropium 0.06 % nasal spray   Commonly known as: ATROVENT   USE 1 TO 2 PUFFS IN EACH NOSTRIL UP TO 3 TIMES A DAY WHEN NEEDED      mulitivitamin with minerals Tabs  Take 1 tablet by mouth daily.      oxyCODONE-acetaminophen 10-325 MG per tablet   Commonly known as: PERCOCET   Take 1 tablet by mouth 3 (three) times daily. For pain      oxyCODONE-acetaminophen 10-325 MG per tablet   Commonly known as: PERCOCET   Take 1 tablet by mouth every 4 (four) hours as needed for pain.      PRILOSEC 20 MG capsule   Generic drug: omeprazole   Take 20 mg by mouth every morning.      simvastatin 40 MG tablet   Commonly known as: ZOCOR   TAKE 1 TABLET BY MOUTH ONCE A DAY      valsartan-hydrochlorothiazide 160-25 MG per tablet   Commonly known as: DIOVAN-HCT   Take 1 tablet by mouth every morning.      Vitamin D3 1000 UNITS Caps   Take 1,000 Units by mouth daily.             SignedCristi Loron 07/11/2011, 12:03 PM

## 2011-07-11 NOTE — Progress Notes (Signed)
Pt is ready for discharge today to [facility]. Facility has received discharge summary and is ready to admit pt. Pt and family are agreeable to discharge plan. [facility] will provide transportation. CSW is signing off as no further clinical social work needs identified.   Dede Query, MSW, Theresia Majors [phone]

## 2011-07-11 NOTE — Progress Notes (Signed)
Physical Therapy Treatment Patient Details Name: [name] [name] MRN: [id number] DOB: 1925-02-23 Today's Date: 07/11/2011  PT Assessment/Plan  PT - Assessment/Plan Comments on Treatment Session: Pt increasing mobility today, able to attempt short distance without RW. Pt excited about transfer to SNF, eventually getting home.  PT Plan: Discharge plan remains appropriate;Frequency remains appropriate PT Frequency: Min 4X/week Follow Up Recommendations: Skilled nursing facility Equipment Recommended: Defer to next venue PT Goals  Acute Rehab PT Goals Pt will go Sit to Supine/Side: with modified independence PT Goal: Sit to Supine/Side - Progress: Progressing toward goal Pt will go Sit to Stand: with modified independence PT Goal: Sit to Stand - Progress: Progressing toward goal Pt will go Stand to Sit: with modified independence PT Goal: Stand to Sit - Progress: Progressing toward goal Pt will Ambulate: >150 feet;with modified independence;with least restrictive assistive device PT Goal: Ambulate - Progress: Progressing toward goal Additional Goals Additional Goal #1: Verbalize 3/3 back precautions, demonstrate adherence to precautions with </= 1 verbal cue PT Goal: Additional Goal #1 - Progress: Progressing toward goal  PT Treatment Precautions/Restrictions  Precautions Precautions: Fall;Back Precaution Booklet Issued: Yes (comment) Precaution Comments: Pt able to verbalize 2/3 back precautions, cues required for "arching."  Required Braces or Orthoses: Yes Spinal Brace: Lumbar corset;Applied in sitting position (moderate assist for donning of brace) Restrictions Weight Bearing Restrictions: No Mobility (including Balance) Bed Mobility Bed Mobility: Yes Sit to Supine: 4: Min assist Sit to Supine - Details (indicate cue type and reason): Verbal cues for sequencing and adherence to back precautions. Assist required for bil. LEs.  Transfers Transfers: Yes Sit to Stand: With  upper extremity assist;From chair/3-in-1;Other (comment);With armrests (min-guard assist) Sit to Stand Details (indicate cue type and reason): min-guard assist for safety, particularly with transition of UEs from armrests to RW.  Stand to Sit: 5: Supervision;With upper extremity assist;To bed Stand to Sit Details: VC for hand placement. Pt able to control descent without any physical assist Ambulation/Gait Ambulation/Gait: Yes Ambulation/Gait Assistance: 5: Supervision Ambulation/Gait Assistance Details (indicate cue type and reason): VC throughout for safe distance to RW. Practiced for ~20 ft without RW, pt continues to require min assist secondary to decreased balance however no major losses of balance. Pt reporting without RW discomfort increased, she prefers RW at this time.  Ambulation Distance (Feet): 300 Feet Assistive device: Rolling walker Gait Pattern: Step-through pattern;Trunk flexed;Decreased stride length Gait velocity: decreased gait speed Stairs: No    Exercise  Other Exercises Other Exercises: Educated pt on pelvic floor exercises for stabilization of back. Used cues such as "stopping urine flow." Pt to practice holds throughout day.  End of Session PT - End of Session Equipment Utilized During Treatment: Gait belt;Back brace Activity Tolerance: Patient tolerated treatment well Patient left: with call bell in reach;in bed General Behavior During Session: [facility] for tasks performed Cognition: [facility] for tasks performed  Wilhemina Bonito 07/11/2011, 8:59 AM  Sherie Don) Carleene Mains PT, DPT Acute Rehabilitation [phone]

## 2011-07-11 NOTE — Progress Notes (Signed)
CARE MANAGEMENT NOTE 07/11/2011    Action/Plan:   patient d/c'd to SNF   Anticipated DC Date:  07/11/2011   Anticipated DC Plan:  SKILLED NURSING FACILITY  In-house referral  Clinical Social Worker      DC Planning Services  CM consult      Choice offered to / List presented to:             Status of service:  Completed, signed off  Discharge Disposition:  SKILLED NURSING FACILITY

## 2011-07-13 DIAGNOSIS — K219 Gastro-esophageal reflux disease without esophagitis: Secondary | ICD-10-CM | POA: Diagnosis not present

## 2011-07-13 DIAGNOSIS — M4800 Spinal stenosis, site unspecified: Secondary | ICD-10-CM | POA: Diagnosis not present

## 2011-07-13 DIAGNOSIS — E7800 Pure hypercholesterolemia, unspecified: Secondary | ICD-10-CM | POA: Diagnosis not present

## 2011-07-13 DIAGNOSIS — I10 Essential (primary) hypertension: Secondary | ICD-10-CM | POA: Diagnosis not present

## 2011-07-14 NOTE — Progress Notes (Signed)
LATE ENTRY 07/14/11 at 1753  Clinical Social Work Department CLINICAL SOCIAL WORK PLACEMENT NOTE 07/14/2011  Patient:  [name] [name], [name] [name]  Account Number:  [id number] Admit date:  07/06/2011  Clinical Social Worker:  Truman Hayward, LCSW  Date/time:  07/08/2011 12:30 PM  Clinical Social Work is seeking post-discharge placement for this patient at the following level of care:      (*CSW will update this form in Epic as items are completed)   07/08/2011  Patient/family provided with [facility] Department of Clinical Social Work's list of facilities offering this level of care within the geographic area requested by the patient (or if unable, by the patient's family).  07/08/2011  Patient/family informed of their freedom to choose among providers that offer the needed level of care, that participate in Medicare, Medicaid or managed care program needed by the patient, have an available bed and are willing to accept the patient.  07/08/2011  Patient/family informed of [facility]' ownership interest in [facility], as well as of the fact that they are under no obligation to receive care at this facility.  PASARR submitted to EDS on 07/08/2011 PASARR number received from [employer] on 07/08/2011  FL2 transmitted to all facilities in geographic area requested by pt/family on  07/08/2011 FL2 transmitted to all facilities within larger geographic area on   Patient informed that his/her managed care company has contracts with or will negotiate with  certain facilities, including the following:     Patient/family informed of bed offers received:  07/11/2011 Patient chooses bed at [facility] Physician recommends and patient chooses bed at  [facility]  Patient to be transferred to [facility] on  07/11/2011 Patient to be transferred to facility by [facility]  The following physician request were entered in Epic:   Additional Comments:

## 2011-07-27 NOTE — Progress Notes (Signed)
  Subjective:    Patient ID: [name] [name], female    DOB: [birth year], 78 y.o.   MRN: [id number]  HPI  Opened in error  Review of Systems     Objective:   Physical Exam        Assessment & Plan:

## 2011-08-01 DIAGNOSIS — M4800 Spinal stenosis, site unspecified: Secondary | ICD-10-CM | POA: Diagnosis not present

## 2011-08-01 DIAGNOSIS — I10 Essential (primary) hypertension: Secondary | ICD-10-CM | POA: Diagnosis not present

## 2011-08-01 DIAGNOSIS — K219 Gastro-esophageal reflux disease without esophagitis: Secondary | ICD-10-CM | POA: Diagnosis not present

## 2011-08-01 DIAGNOSIS — E7800 Pure hypercholesterolemia, unspecified: Secondary | ICD-10-CM | POA: Diagnosis not present

## 2011-08-08 ENCOUNTER — Telehealth: Payer: Self-pay | Admitting: Internal Medicine

## 2011-08-08 NOTE — Telephone Encounter (Signed)
Pt called and had surgery in March. Pt is needing to come in for fup ov asap. Pt is sch to come in June 13 to see Dr Lovell Sheehan, but is req to come in sooner than that, if Dr Lovell Sheehan feels that it is necessary.Pls advise.

## 2011-08-08 NOTE — Telephone Encounter (Signed)
Called pt and rsc her ov to 08/30/11 at 11:30 am as noted.

## 2011-08-08 NOTE — Telephone Encounter (Signed)
We have several openings the first week of may- she may use one of t hose days- thanks

## 2011-08-09 DIAGNOSIS — M4310 Spondylolisthesis, site unspecified: Secondary | ICD-10-CM | POA: Diagnosis not present

## 2011-08-15 ENCOUNTER — Ambulatory Visit (INDEPENDENT_AMBULATORY_CARE_PROVIDER_SITE_OTHER): Payer: Medicare Other | Admitting: Internal Medicine

## 2011-08-15 ENCOUNTER — Encounter: Payer: Self-pay | Admitting: Internal Medicine

## 2011-08-15 VITALS — BP 122/72 | HR 68 | Ht 62.0 in | Wt 166.2 lb

## 2011-08-15 DIAGNOSIS — J4489 Other specified chronic obstructive pulmonary disease: Secondary | ICD-10-CM

## 2011-08-15 DIAGNOSIS — J449 Chronic obstructive pulmonary disease, unspecified: Secondary | ICD-10-CM | POA: Diagnosis not present

## 2011-08-15 DIAGNOSIS — J301 Allergic rhinitis due to pollen: Secondary | ICD-10-CM | POA: Diagnosis not present

## 2011-08-15 DIAGNOSIS — J984 Other disorders of lung: Secondary | ICD-10-CM | POA: Diagnosis not present

## 2011-08-15 DIAGNOSIS — R911 Solitary pulmonary nodule: Secondary | ICD-10-CM

## 2011-08-15 NOTE — Patient Instructions (Signed)
We will arrange a last CT to revisit the lung nodule issue when I see you in the Fall  I will have the allergy lab re-start your allergy vaccine - They will call you.  It is ok to use an antihistamine like Allegra in addition to your vaccine as needed

## 2011-08-15 NOTE — Progress Notes (Signed)
Patient ID: [name] [name], female    DOB: 11/05/1924, 78 y.o.   MRN: [id number]  HPI 08/02/10-85 yoF former smoker, followed here for allergic rhinitis on allergy vaccine, and COPD. She is bothered on chronic basis by her watery, burning eyes. She just saw Dr Dione Booze, with all his drops tried. She is worse in past 1-2 months with Spring pollen season. She is resigned to staying this way. Seeing Dr Terri Piedra for significant eczema of fingertips.  She remains satisfied that the allergy vaccine is helpful and worth continuing.  01/31/11-  86 yoF former smoker, followed here for allergic rhinitis on allergy vaccine, and COPD, lung nodules. She is bothered on chronic basis by her watery, burning eyes. Acute visit-ill for past 2 days. Complains of pressure in her ears, weakness and shakiness, headache, nausea. Eyes always water when she goes outdoors. She denies fever, swollen glands, sore throat, discolored sputum, chest tightness or wheeze. She has been on tramadol for several weeks and asks if that might be what started causing her to feel badly just a few days ago. CT chest 12/07/10-  IMPRESSION:  1. Compared with previous exam the previously referenced pulmonary  nodules in the right middle lobe have not changed in size.  Recommend continued follow-up to two years from initial study  01/15/2010.  Original Report Authenticated By: Rosealee Albee, M.D.   08/15/11- 63 yoF former smoker, followed here for allergic rhinitis on allergy vaccine, and COPD, lung nodules. She is bothered on chronic basis by her watery, burning eyes. Off of allergy vaccine for 6 weeks because of back surgery. Wants to resume and we discussed risk and safety considerations and alternatives. Allegra helps. Previously identified lung nodules, back to 2011. She had chest x-ray in hospital 06/27/2011 with stable nodularity right middle lobe. That completes 2 years of surveillance.  Review of Systems-See HPI Constitutional:   No-    weight loss, night sweats, fevers, chills,+ fatigue, lassitude. HEENT:   No-  headaches, difficulty swallowing, tooth/dental problems, sore throat,       No-  sneezing, itching, ear ache, nasal congestion, post nasal drip,  CV:  No-   chest pain, orthopnea, PND, swelling in lower extremities, anasarca, dizziness, palpitations Resp: No-   shortness of breath with exertion or at rest.              No-   productive cough,  No non-productive cough,  No-  coughing up of blood.              No-   change in color of mucus.  No- wheezing.   Skin: No-   rash or lesions. GI:  No-   heartburn, indigestion, abdominal pain, + nausea, GU: . MS:  No-   joint pain or swelling.  Neuro- grossly normal to observation, Or:  Psych:  No- change in mood or affect. No depression or anxiety.  No memory loss.      Objective:   Physical Exam General- Alert, Oriented, Affect-appropriate, Distress- none acute Skin- rash-none, lesions- none, excoriation- none Lymphadenopathy- none Head- atraumatic            Eyes- Gross vision intact, PERRLA, conjunctivae clear secretions            Ears- Hearing, canals-normal            Nose- Clear, no-Septal dev, mucus, polyps, erosion, perforation             Throat- Mallampati II , mucosa clear ,  drainage- none, tonsils- atrophic Neck- flexible , trachea midline, no stridor , thyroid nl, carotid no bruit Chest - symmetrical excursion , unlabored           Heart/CV- RRR , no murmur , no gallop  , no rub, nl s1 s2                           - JVD- none , edema- none, stasis changes- none, varices- none           Lung- clear to P&A, wheeze- none, cough- none , dullness-none, rub- none           Chest wall- wearing back brace after back surgery Abd-  Br/ Gen/ Rectal- Not done, not indicated Extrem- cyanosis- none, clubbing, none, atrophy- none, strength- nl Neuro- grossly intact to observation; mildly tremulous

## 2011-08-18 ENCOUNTER — Encounter: Payer: Self-pay | Admitting: Internal Medicine

## 2011-08-18 DIAGNOSIS — H04129 Dry eye syndrome of unspecified lacrimal gland: Secondary | ICD-10-CM | POA: Diagnosis not present

## 2011-08-18 DIAGNOSIS — Z961 Presence of intraocular lens: Secondary | ICD-10-CM | POA: Diagnosis not present

## 2011-08-18 DIAGNOSIS — H1045 Other chronic allergic conjunctivitis: Secondary | ICD-10-CM | POA: Diagnosis not present

## 2011-08-18 DIAGNOSIS — H40059 Ocular hypertension, unspecified eye: Secondary | ICD-10-CM | POA: Diagnosis not present

## 2011-08-18 NOTE — Assessment & Plan Note (Signed)
controlled 

## 2011-08-18 NOTE — Assessment & Plan Note (Signed)
She had a chest x-ray 06/27/2011 indicating stability. We will accept that imaging and reassess going forward.

## 2011-08-18 NOTE — Assessment & Plan Note (Signed)
We discussed options. Plan-restart allergy vaccine here at 0.1 mL's of 1:10 and build as tolerated.

## 2011-08-19 ENCOUNTER — Ambulatory Visit (INDEPENDENT_AMBULATORY_CARE_PROVIDER_SITE_OTHER): Payer: Medicare Other

## 2011-08-19 DIAGNOSIS — J309 Allergic rhinitis, unspecified: Secondary | ICD-10-CM

## 2011-08-26 ENCOUNTER — Ambulatory Visit (INDEPENDENT_AMBULATORY_CARE_PROVIDER_SITE_OTHER): Payer: Medicare Other

## 2011-08-26 DIAGNOSIS — J309 Allergic rhinitis, unspecified: Secondary | ICD-10-CM | POA: Diagnosis not present

## 2011-08-30 ENCOUNTER — Encounter: Payer: Self-pay | Admitting: Internal Medicine

## 2011-08-30 ENCOUNTER — Ambulatory Visit (INDEPENDENT_AMBULATORY_CARE_PROVIDER_SITE_OTHER): Payer: Medicare Other | Admitting: Internal Medicine

## 2011-08-30 VITALS — BP 140/70 | HR 72 | Temp 98.0°F | Resp 16 | Ht 62.0 in | Wt 162.0 lb

## 2011-08-30 DIAGNOSIS — I10 Essential (primary) hypertension: Secondary | ICD-10-CM

## 2011-08-30 DIAGNOSIS — E785 Hyperlipidemia, unspecified: Secondary | ICD-10-CM

## 2011-08-30 DIAGNOSIS — T887XXA Unspecified adverse effect of drug or medicament, initial encounter: Secondary | ICD-10-CM

## 2011-08-30 LAB — BASIC METABOLIC PANEL
Calcium: 9.9 mg/dL (ref 8.4–10.5)
GFR: 70.45 mL/min (ref >60.00)
Glucose, Bld: 97 mg/dL (ref 70–99)
Potassium: 4.2 mEq/L (ref 3.5–5.1)
Sodium: 139 mEq/L (ref 135–145)

## 2011-08-30 LAB — LIPID PANEL
HDL: 63.6 mg/dL (ref >39.00)
Total CHOL/HDL Ratio: 3
Triglycerides: 242 mg/dL — ABNORMAL HIGH (ref 0.0–149.0)

## 2011-08-30 LAB — CBC WITH DIFFERENTIAL/PLATELET
Basophils Absolute: 0 10*3/uL (ref 0.0–0.1)
Eosinophils Absolute: 0.2 10*3/uL (ref 0.0–0.7)
Hemoglobin: 12.7 g/dL (ref 12.0–15.0)
Lymphs Abs: 1.8 10*3/uL (ref 0.7–4.0)
Monocytes Absolute: 0.6 10*3/uL (ref 0.1–1.0)
Platelets: 173 10*3/uL (ref 150.0–400.0)
RBC: 4.45 Mil/uL (ref 3.87–5.11)
WBC: 7.1 10*3/uL (ref 4.5–10.5)

## 2011-08-30 LAB — HEPATIC FUNCTION PANEL
ALT: 11 U/L (ref 0–35)
Alkaline Phosphatase: 69 U/L (ref 39–117)
Bilirubin, Direct: 0 mg/dL (ref 0.0–0.3)
Total Bilirubin: 0.2 mg/dL — ABNORMAL LOW (ref 0.3–1.2)
Total Protein: 7.3 g/dL (ref 6.0–8.3)

## 2011-08-30 NOTE — Patient Instructions (Signed)
The patient is instructed to continue all medications as prescribed. Schedule followup with check out clerk upon leaving the clinic  

## 2011-08-30 NOTE — Progress Notes (Signed)
Subjective:    Patient ID: [name] [name], female    DOB: February 10, 1925, 78 y.o.   MRN: [id number]  HPI Patient has had excellent pain relief post her back surgery she underwent inpatient rehabilitation and is now home.  She is independent living and doing well she is seeing her allergist and is back on allergy injections and her COPD/asthma stable Blood pressure is stable She presents today for evaluation of her current medications and for basic metabolic panel blood count to assess renal function blood sugar control and CBC differential  Review of Systems  Constitutional: Positive for fatigue. Negative for activity change and appetite change.  HENT: Negative for ear pain, congestion, neck pain, postnasal drip and sinus pressure.   Eyes: Negative for redness and visual disturbance.  Respiratory: Negative for cough, shortness of breath and wheezing.   Cardiovascular: Positive for leg swelling.  Gastrointestinal: Negative for abdominal pain and abdominal distention.  Genitourinary: Negative for dysuria, frequency and menstrual problem.  Musculoskeletal: Positive for myalgias and gait problem. Negative for joint swelling and arthralgias.  Skin: Negative for rash and wound.  Neurological: Negative for dizziness, weakness and headaches.  Hematological: Negative for adenopathy. Does not bruise/bleed easily.  Psychiatric/Behavioral: Negative for disturbed wake/sleep cycle and decreased concentration.   Past Medical History  Diagnosis Date  . GERD (gastroesophageal reflux disease)   . Hyperlipidemia   . History of colonic polyps   . Diverticulosis   . Shingles   . Eczema   . Allergic rhinoconjunctivitis   . Lung nodule 2011  . Environmental allergies     allergy shot- q friday in Dr. Roxy Cedar office. PFT's abnormal- recommended Spiriva to use preop & will d/c after surgery  . Arthritis     low back , stenosis  . Anxiety     pt. managed- uses deep breathing   . Stenosis of popliteal  artery     blood clots in legs long ago    . COPD (chronic obstructive pulmonary disease)   . H/O hiatal hernia   . Hypertension     History   Social History  . Marital Status: Single    Spouse Name: N/A    Number of Children: N/A  . Years of Education: N/A   Occupational History  . Not on file.   Social History Main Topics  . Smoking status: Never Smoker   . Smokeless tobacco: Not on file   Comment: QUIT IN 1995  . Alcohol Use: No  . Drug Use: No  . Sexually Active:    Other Topics Concern  . Not on file   Social History Narrative   ** Merged History Encounter **     Past Surgical History  Procedure Date  . Tubal ligation   . Cholecystectomy 1998  . Femoral-popliteal bypass graft        x2 surgeries 1990's & 2009  . Eye surgery     cataracts removed- bilateral /w IOL  . Tonsillectomy     as a teenager   . Tonsilectomy, adenoidectomy, bilateral myringotomy and tubes     no myringotomy tubes   . Lumbar fusion 07/06/2011    Family History  Problem Relation Age of Onset  . Arthritis Mother   . Heart disease Father   . Colon polyps Father   . Breast cancer Sister   . Lung cancer Sister   . Colon cancer Sister   . Lung cancer Brother   . Anesthesia problems Neg Hx   . Hypotension  Neg Hx   . Malignant hyperthermia Neg Hx   . Pseudochol deficiency Neg Hx     Allergies  Allergen Reactions  . Latex     REACTION: rash  . Sulfa Antibiotics Other (See Comments)    Cold sweat light headed   . Sulfamethoxazole     REACTION: disorientation  . Tiotropium Bromide Other (See Comments)    Sore throat    Current Outpatient Prescriptions on File Prior to Visit  Medication Sig Dispense Refill  . calcium-vitamin D (OSCAL WITH D 500-200) 500-200 MG-UNIT per tablet Take 1 tablet by mouth daily.        . carbamazepine (CARBATROL) 100 MG 12 hr capsule Take 100 mg by mouth 2 (two) times daily. Takes one at night and may take a second in the morning for pain      .  Cholecalciferol (VITAMIN D3) 1000 UNITS CAPS Take 1,000 Units by mouth daily.       . Multiple Vitamin (MULITIVITAMIN WITH MINERALS) TABS Take 1 tablet by mouth daily.      Marland Kitchen omeprazole (PRILOSEC) 20 MG capsule Take 20 mg by mouth every morning.       . valsartan-hydrochlorothiazide (DIOVAN-HCT) 160-25 MG per tablet Take 1 tablet by mouth every morning.       . simvastatin (ZOCOR) 40 MG tablet Take 1 tablet (40 mg total) by mouth at bedtime.  90 tablet  3    BP 140/70  Pulse 72  Temp 98 F (36.7 C)  Resp 16  Ht 5\' 2"  (1.575 m)  Wt 162 lb (73.483 kg)  BMI 29.63 kg/m2       Objective:   Physical Exam  Constitutional: She is oriented to person, place, and time. She appears well-developed and well-nourished. No distress.  HENT:  Head: Normocephalic and atraumatic.  Right Ear: External ear normal.  Left Ear: External ear normal.  Nose: Nose normal.  Mouth/Throat: Oropharynx is clear and moist.  Eyes: Conjunctivae normal and EOM are normal. Pupils are equal, round, and reactive to light.  Neck: Normal range of motion. Neck supple. No JVD present. No tracheal deviation present. No thyromegaly present.  Cardiovascular: Normal rate, regular rhythm and intact distal pulses.   Murmur heard. Pulmonary/Chest: Effort normal and breath sounds normal. She has no wheezes. She exhibits no tenderness.  Abdominal: Soft. Bowel sounds are normal.  Musculoskeletal: Normal range of motion. She exhibits edema. She exhibits no tenderness.  Lymphadenopathy:    She has no cervical adenopathy.  Neurological: She is alert and oriented to person, place, and time. She displays abnormal reflex. No cranial nerve deficit. Coordination abnormal.  Skin: Skin is warm and dry. She is not diaphoretic.  Psychiatric: She has a normal mood and affect. Her behavior is normal.          Assessment & Plan:  Status post back surgery with significant pain improvement.  COPD/asthma stable with maintenance drugs and  now beginning allergy injections.  Stable blood pressure.  Appropriate laboratory screening for postoperative anemia and renal function and blood sugar control

## 2011-08-31 LAB — LDL CHOLESTEROL, DIRECT: Direct LDL: 76.1 mg/dL

## 2011-09-02 ENCOUNTER — Ambulatory Visit (INDEPENDENT_AMBULATORY_CARE_PROVIDER_SITE_OTHER): Payer: Medicare Other

## 2011-09-02 DIAGNOSIS — J309 Allergic rhinitis, unspecified: Secondary | ICD-10-CM | POA: Diagnosis not present

## 2011-09-05 ENCOUNTER — Telehealth: Payer: Self-pay | Admitting: Critical Care Medicine

## 2011-09-05 NOTE — Telephone Encounter (Signed)
Opened in error

## 2011-09-09 ENCOUNTER — Ambulatory Visit (INDEPENDENT_AMBULATORY_CARE_PROVIDER_SITE_OTHER): Payer: Medicare Other

## 2011-09-09 DIAGNOSIS — J309 Allergic rhinitis, unspecified: Secondary | ICD-10-CM | POA: Diagnosis not present

## 2011-09-16 ENCOUNTER — Ambulatory Visit (INDEPENDENT_AMBULATORY_CARE_PROVIDER_SITE_OTHER): Payer: Medicare Other

## 2011-09-16 DIAGNOSIS — J309 Allergic rhinitis, unspecified: Secondary | ICD-10-CM | POA: Diagnosis not present

## 2011-09-23 ENCOUNTER — Ambulatory Visit (INDEPENDENT_AMBULATORY_CARE_PROVIDER_SITE_OTHER): Payer: Medicare Other

## 2011-09-23 DIAGNOSIS — J309 Allergic rhinitis, unspecified: Secondary | ICD-10-CM

## 2011-09-27 ENCOUNTER — Other Ambulatory Visit: Payer: Self-pay | Admitting: *Deleted

## 2011-09-27 MED ORDER — SIMVASTATIN 40 MG PO TABS: 40.0000 mg | ORAL_TABLET | Freq: Every day | ORAL | Status: DC

## 2011-10-06 ENCOUNTER — Ambulatory Visit: Payer: Self-pay | Admitting: Internal Medicine

## 2011-10-07 ENCOUNTER — Ambulatory Visit (INDEPENDENT_AMBULATORY_CARE_PROVIDER_SITE_OTHER): Payer: Medicare Other

## 2011-10-07 DIAGNOSIS — J309 Allergic rhinitis, unspecified: Secondary | ICD-10-CM

## 2011-10-10 ENCOUNTER — Ambulatory Visit (INDEPENDENT_AMBULATORY_CARE_PROVIDER_SITE_OTHER): Payer: Medicare Other

## 2011-10-10 DIAGNOSIS — J309 Allergic rhinitis, unspecified: Secondary | ICD-10-CM | POA: Diagnosis not present

## 2011-10-14 ENCOUNTER — Ambulatory Visit (INDEPENDENT_AMBULATORY_CARE_PROVIDER_SITE_OTHER): Payer: Medicare Other

## 2011-10-14 DIAGNOSIS — J309 Allergic rhinitis, unspecified: Secondary | ICD-10-CM

## 2011-10-21 ENCOUNTER — Ambulatory Visit (INDEPENDENT_AMBULATORY_CARE_PROVIDER_SITE_OTHER): Payer: Medicare Other

## 2011-10-21 DIAGNOSIS — J309 Allergic rhinitis, unspecified: Secondary | ICD-10-CM | POA: Diagnosis not present

## 2011-10-28 ENCOUNTER — Ambulatory Visit (INDEPENDENT_AMBULATORY_CARE_PROVIDER_SITE_OTHER): Payer: Medicare Other

## 2011-10-28 DIAGNOSIS — J309 Allergic rhinitis, unspecified: Secondary | ICD-10-CM | POA: Diagnosis not present

## 2011-11-04 ENCOUNTER — Ambulatory Visit (INDEPENDENT_AMBULATORY_CARE_PROVIDER_SITE_OTHER): Payer: Medicare Other

## 2011-11-04 DIAGNOSIS — J309 Allergic rhinitis, unspecified: Secondary | ICD-10-CM | POA: Diagnosis not present

## 2011-11-08 ENCOUNTER — Encounter: Payer: Self-pay | Admitting: Internal Medicine

## 2011-11-08 ENCOUNTER — Ambulatory Visit (INDEPENDENT_AMBULATORY_CARE_PROVIDER_SITE_OTHER): Payer: Medicare Other | Admitting: Internal Medicine

## 2011-11-08 VITALS — BP 130/70 | HR 72 | Temp 98.6°F | Resp 16 | Ht 62.0 in | Wt 160.0 lb

## 2011-11-08 DIAGNOSIS — IMO0001 Reserved for inherently not codable concepts without codable children: Secondary | ICD-10-CM

## 2011-11-08 DIAGNOSIS — I10 Essential (primary) hypertension: Secondary | ICD-10-CM | POA: Diagnosis not present

## 2011-11-08 DIAGNOSIS — M791 Myalgia, unspecified site: Secondary | ICD-10-CM

## 2011-11-08 DIAGNOSIS — G500 Trigeminal neuralgia: Secondary | ICD-10-CM | POA: Diagnosis not present

## 2011-11-08 DIAGNOSIS — M549 Dorsalgia, unspecified: Secondary | ICD-10-CM

## 2011-11-08 DIAGNOSIS — Z5181 Encounter for therapeutic drug level monitoring: Secondary | ICD-10-CM | POA: Diagnosis not present

## 2011-11-08 MED ORDER — MELOXICAM 15 MG PO TABS: 15.0000 mg | ORAL_TABLET | Freq: Every day | ORAL | Status: DC

## 2011-11-08 MED ORDER — OXYCODONE-ACETAMINOPHEN 10-325 MG PO TABS: 1.0000 | ORAL_TABLET | Freq: Three times a day (TID) | ORAL | Status: DC

## 2011-11-08 NOTE — Progress Notes (Signed)
Subjective:    Patient ID: [name] [name], female    DOB: Apr 23, 1925, 78 y.o.   MRN: [id number]  HPI patient is an 78 year old female who is followed for multiple medical problems including recent back surgery for spinal stenosis. He has a history of glucose intolerance but recent basic metabolic panel showed a normal fasting glucose she has a history of treated hyperlipidemia and she has an excellent HDL LDL and total cholesterol but has elevated triglycerides she has treated hypertension and her blood pressure is controlled with her blood pressure today 130/70 she is mild to moderate COPD    Review of Systems  Constitutional: Negative for activity change, appetite change and fatigue.  HENT: Negative for ear pain, congestion, neck pain, postnasal drip and sinus pressure.   Eyes: Negative for redness and visual disturbance.  Respiratory: Negative for cough, shortness of breath and wheezing.   Gastrointestinal: Negative for abdominal pain and abdominal distention.  Genitourinary: Negative for dysuria, frequency and menstrual problem.  Musculoskeletal: Negative for myalgias, joint swelling and arthralgias.  Skin: Negative for rash and wound.  Neurological: Negative for dizziness, weakness and headaches.  Hematological: Negative for adenopathy. Does not bruise/bleed easily.  Psychiatric/Behavioral: Negative for disturbed wake/sleep cycle and decreased concentration.   Past Medical History  Diagnosis Date  . GERD (gastroesophageal reflux disease)   . Hyperlipidemia   . Hypertension   . History of colonic polyps   . Diverticulosis   . Shingles   . Eczema   . Allergic rhinoconjunctivitis   . Lung nodule 2011  . Environmental allergies     allergy shot- q friday in Dr. Roxy Cedar office. PFT's abnormal- recommended Spiriva to use preop & will d/c after surgery  . Arthritis     low back , stenosis  . Anxiety     pt. managed- uses deep breathing   . Stenosis of popliteal artery     blood  clots in legs long ago    . COPD (chronic obstructive pulmonary disease)   . H/O hiatal hernia     History   Social History  . Marital Status: Widowed    Spouse Name: N/A    Number of Children: N/A  . Years of Education: N/A   Occupational History  . Not on file.   Social History Main Topics  . Smoking status: Former Smoker -- 2.0 packs/day    Quit date: 06/18/1993  . Smokeless tobacco: Never Used   Comment: QUIT IN 1995  . Alcohol Use: No  . Drug Use: No  . Sexually Active: Not Currently    Birth Control/ Protection: Post-menopausal   Other Topics Concern  . Not on file   Social History Narrative  . No narrative on file    Past Surgical History  Procedure Date  . Tubal ligation   . Cholecystectomy 1998  . Femoral-popliteal bypass graft        x2 surgeries 1990's & 2009  . Eye surgery     cataracts removed- bilateral /w IOL  . Tonsillectomy     as a teenager   . Tonsilectomy, adenoidectomy, bilateral myringotomy and tubes     no myringotomy tubes   . Lumbar fusion 07/06/2011    Family History  Problem Relation Age of Onset  . Arthritis Mother   . Heart disease Father   . Colon polyps Father   . Breast cancer Sister   . Lung cancer Sister   . Colon cancer Sister   . Lung cancer Brother   .  Anesthesia problems Neg Hx   . Hypotension Neg Hx   . Malignant hyperthermia Neg Hx   . Pseudochol deficiency Neg Hx     Allergies  Allergen Reactions  . Latex     REACTION: rash  . Sulfamethoxazole     REACTION: disorientation  . Tiotropium Bromide Other (See Comments)    Sore throat    Current Outpatient Prescriptions on File Prior to Visit  Medication Sig Dispense Refill  . aspirin EC 81 MG tablet Take 81 mg by mouth daily.      . calcium-vitamin D (OSCAL WITH D 500-200) 500-200 MG-UNIT per tablet Take 1 tablet by mouth daily.        . carbamazepine (CARBATROL) 100 MG 12 hr capsule Take 100 mg by mouth 2 (two) times daily. Takes one at night and may take  a second in the morning for pain      . Cholecalciferol (VITAMIN D3) 1000 UNITS CAPS Take 1,000 Units by mouth daily.       . Multiple Vitamin (MULITIVITAMIN WITH MINERALS) TABS Take 1 tablet by mouth daily.      Marland Kitchen omeprazole (PRILOSEC) 20 MG capsule Take 20 mg by mouth every morning.       Marland Kitchen oxyCODONE-acetaminophen (PERCOCET) 10-325 MG per tablet Take 1 tablet by mouth 3 (three) times daily. For pain  90 tablet  0  . simvastatin (ZOCOR) 40 MG tablet Take 1 tablet (40 mg total) by mouth at bedtime.  90 tablet  3  . valsartan-hydrochlorothiazide (DIOVAN-HCT) 160-25 MG per tablet Take 1 tablet by mouth every morning.         BP 130/70  Pulse 72  Temp 98.6 F (37 C)  Resp 16  Ht 5\' 2"  (1.575 m)  Wt 160 lb (72.576 kg)  BMI 29.26 kg/m2       Objective:   Physical Exam  Nursing note and vitals reviewed. Constitutional: She is oriented to person, place, and time. She appears well-developed and well-nourished. No distress.  HENT:  Head: Normocephalic and atraumatic.  Right Ear: External ear normal.  Left Ear: External ear normal.  Nose: Nose normal.  Mouth/Throat: Oropharynx is clear and moist.  Eyes: Conjunctivae and EOM are normal. Pupils are equal, round, and reactive to light.  Neck: Normal range of motion. Neck supple. No JVD present. No tracheal deviation present. No thyromegaly present.  Cardiovascular: Normal rate, regular rhythm, normal heart sounds and intact distal pulses.   No murmur heard. Pulmonary/Chest: Effort normal and breath sounds normal. She has no wheezes. She exhibits no tenderness.  Abdominal: Soft. Bowel sounds are normal.  Musculoskeletal: Normal range of motion. She exhibits no edema and no tenderness.  Lymphadenopathy:    She has no cervical adenopathy.  Neurological: She is alert and oriented to person, place, and time. She has normal reflexes. No cranial nerve deficit.  Skin: Skin is warm and dry. She is not diaphoretic.  Psychiatric: She has a normal  mood and affect. Her behavior is normal.          Assessment & Plan:  The elevated triglycerides are most probably related to her  Glucose intolerance we talked about limiting sugar and sweets. Her blood pressure stable her current medication she's done well with her physical therapy posterior surgery and hopes to get her back brace off next week.  Her breathing has been stable List operative anemia had resolved She is stable on her current blood pressure medications her current cholesterol medications  Percocet use  has declined to less than 2 a day   We are adding meloxicam one daily to help with the morning stiffness we will continue to allow her to Percocet for chronic pain postoperatively due to the spinal stenosis would hope that she can continue using Percocet sparingly.  Followup is planned in 2 months

## 2011-11-08 NOTE — Patient Instructions (Signed)
The patient is instructed to continue all medications as prescribed. Schedule followup with check out clerk upon leaving the clinic  

## 2011-11-11 ENCOUNTER — Ambulatory Visit (INDEPENDENT_AMBULATORY_CARE_PROVIDER_SITE_OTHER): Payer: Medicare Other

## 2011-11-11 DIAGNOSIS — J309 Allergic rhinitis, unspecified: Secondary | ICD-10-CM

## 2011-11-18 ENCOUNTER — Ambulatory Visit (INDEPENDENT_AMBULATORY_CARE_PROVIDER_SITE_OTHER): Payer: Medicare Other

## 2011-11-18 DIAGNOSIS — J309 Allergic rhinitis, unspecified: Secondary | ICD-10-CM

## 2011-11-18 DIAGNOSIS — M4310 Spondylolisthesis, site unspecified: Secondary | ICD-10-CM | POA: Diagnosis not present

## 2011-11-25 ENCOUNTER — Ambulatory Visit (INDEPENDENT_AMBULATORY_CARE_PROVIDER_SITE_OTHER): Payer: Medicare Other

## 2011-11-25 DIAGNOSIS — J309 Allergic rhinitis, unspecified: Secondary | ICD-10-CM

## 2011-12-02 ENCOUNTER — Ambulatory Visit (INDEPENDENT_AMBULATORY_CARE_PROVIDER_SITE_OTHER): Payer: Medicare Other

## 2011-12-02 DIAGNOSIS — J309 Allergic rhinitis, unspecified: Secondary | ICD-10-CM

## 2011-12-05 ENCOUNTER — Encounter (HOSPITAL_COMMUNITY): Payer: Self-pay | Admitting: Emergency Medicine

## 2011-12-05 ENCOUNTER — Emergency Department (HOSPITAL_COMMUNITY)
Admission: EM | Admit: 2011-12-05 | Discharge: 2011-12-05 | Discharge status: Against Medical Advice | Payer: Medicare Other

## 2011-12-05 ENCOUNTER — Other Ambulatory Visit: Payer: Self-pay

## 2011-12-05 ENCOUNTER — Emergency Department (HOSPITAL_COMMUNITY)
Admission: EM | Admit: 2011-12-05 | Discharge: 2011-12-05 | Disposition: A | Discharge status: Home / Self Care | Payer: Medicare Other | Attending: Emergency Medicine | Admitting: Emergency Medicine

## 2011-12-05 DIAGNOSIS — R110 Nausea: Secondary | ICD-10-CM | POA: Insufficient documentation

## 2011-12-05 DIAGNOSIS — R55 Syncope and collapse: Secondary | ICD-10-CM | POA: Diagnosis not present

## 2011-12-05 DIAGNOSIS — I10 Essential (primary) hypertension: Secondary | ICD-10-CM | POA: Diagnosis not present

## 2011-12-05 LAB — COMPREHENSIVE METABOLIC PANEL
ALT: 8 U/L (ref 0–35)
AST: 14 U/L (ref 0–37)
Albumin: 4.1 g/dL (ref 3.5–5.2)
Alkaline Phosphatase: 84 U/L (ref 39–117)
CO2: 24 mEq/L (ref 19–32)
Chloride: 98 mEq/L (ref 96–112)
GFR calc non Af Amer: 46 mL/min — ABNORMAL LOW (ref >90)
Potassium: 4.9 mEq/L (ref 3.5–5.1)
Sodium: 135 mEq/L (ref 135–145)
Total Bilirubin: 0.3 mg/dL (ref 0.3–1.2)

## 2011-12-05 LAB — URINALYSIS, MICROSCOPIC ONLY
Glucose, UA: NEGATIVE mg/dL
Protein, ur: NEGATIVE mg/dL
pH: 5.5 (ref 5.0–8.0)

## 2011-12-05 LAB — CBC WITH DIFFERENTIAL/PLATELET
Basophils Absolute: 0 10*3/uL (ref 0.0–0.1)
Basophils Relative: 0 % (ref 0–1)
HCT: 38.3 % (ref 36.0–46.0)
Lymphocytes Relative: 11 % — ABNORMAL LOW (ref 12–46)
MCHC: 33.4 g/dL (ref 30.0–36.0)
Monocytes Absolute: 0.7 10*3/uL (ref 0.1–1.0)
Neutro Abs: 8 10*3/uL — ABNORMAL HIGH (ref 1.7–7.7)
Neutrophils Relative %: 82 % — ABNORMAL HIGH (ref 43–77)
RDW: 13.5 % (ref 11.5–15.5)
WBC: 9.8 10*3/uL (ref 4.0–10.5)

## 2011-12-05 LAB — GLUCOSE, CAPILLARY: Glucose-Capillary: 157 mg/dL — ABNORMAL HIGH (ref 70–99)

## 2011-12-05 MED ORDER — SODIUM CHLORIDE 0.9 % IV BOLUS (SEPSIS)
500.0000 mL | Freq: Once | INTRAVENOUS | Status: AC
  Administered 2011-12-05: 500 mL via INTRAVENOUS

## 2011-12-05 NOTE — ED Notes (Addendum)
Pt volunteer.  Pt was in bathroom when she broke out in cold sweat and felt lightheaded.  Pt felt faint, no loc.  Pt denies pain.

## 2011-12-05 NOTE — ED Notes (Signed)
Pt had bowel movement and states she feels much better.  Pt able to stand without assist.  Pt states nausea is better

## 2011-12-05 NOTE — ED Notes (Signed)
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

## 2011-12-05 NOTE — ED Provider Notes (Signed)
History     CSN: [id number]  Arrival date & time 12/05/11  1046   First MD Initiated Contact with Patient 12/05/11 1130      Chief Complaint  Patient presents with  . Near Syncope  . Nausea    (Consider location/radiation/quality/duration/timing/severity/associated sxs/prior treatment) The history is provided by the patient. No language interpreter was used.  cc: 78 year old female coming in after a near syncopal episode while having a bowel movement. The hospital. 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 States that she took a laxative last night before she went to bed because she is constipated. States that after the bowel movement she was lightheaded and had diaphoresis. Patient has no past medical history as syncope or chest pain. Patient denies shortness of breath chest pain and states that she feels much better now. Patient CBG was 157 and she is on antihypertension medication HCTZ. Patient lives alone and does not want me to call her family at this time. States she did have toast and jelly for breakfast. States that she was a bit nauseated at the time also. Patient does not drink patient does not smoke. No other past medical histories. Her PCP is Dr. Darryll Capers.  Past Medical History  Diagnosis Date  . Hypertension     History reviewed. No pertinent past surgical history.  History reviewed. No pertinent family history.  History  Substance Use Topics  . Smoking status: Never Smoker   . Smokeless tobacco: Not on file  . Alcohol Use: No    OB History    Grav Para Term Preterm Abortions TAB SAB Ect Mult Living                  Review of Systems  Constitutional: Negative.   HENT: Negative.   Eyes: Negative.   Respiratory: Negative.   Cardiovascular: Negative.   Gastrointestinal: Negative.   Neurological: Negative.   Psychiatric/Behavioral: Negative.   All other  systems reviewed and are negative.    Allergies  Review of patient's allergies indicates not on file.  Home Medications  No current outpatient prescriptions on file.  BP 100/59  Pulse 60  Temp 97.7 F (36.5 C) (Oral)  Resp 16  Physical Exam  Nursing note and vitals reviewed. Constitutional: She is oriented to person, place, and time. She appears well-developed and well-nourished.  HENT:  Head: Normocephalic and atraumatic.  Eyes: Conjunctivae and EOM are normal. Pupils are equal, round, and reactive to light.  Neck: Normal range of motion. Neck supple.  Cardiovascular: Normal rate, regular rhythm and normal heart sounds.   Pulmonary/Chest: Effort normal and breath sounds normal. No respiratory distress.  Abdominal: Soft. She exhibits no distension. There is no tenderness.  Musculoskeletal: Normal range of motion.  Neurological: She is alert and oriented to person, place, and time. She has normal reflexes.  Skin: Skin is warm and dry.  Psychiatric: She has a normal mood and affect.    ED Course  Procedures (including critical care time)  Labs Reviewed  GLUCOSE, CAPILLARY - Abnormal; Notable for the following:    Glucose-Capillary 157 (*)     All other components within normal limits  CBC WITH DIFFERENTIAL  COMPREHENSIVE METABOLIC PANEL  TROPONIN I  URINALYSIS, WITH MICROSCOPIC   No results found.   No diagnosis found.    MDM  78 year old female with a vasovagal near syncopal episode while having  a bowel movement this morning. Labs in the ER unremarkable. CBG was 157. Patient feels better after 500 cc of fluid and eating and would like to go home. She will followup with her PCP tomorrow and have neighbors and family check on her tonight. Patient is a hospital volunteer times many years and is aware of signs and symptoms of heart attacks and strokes which she says she has none of them. Troponin is negative and EKG is unremarkable.  Date: 12/05/2011  Rate: 55   Rhythm: sinus bradycardia  QRS Axis: normal  Intervals: normal  ST/T Wave abnormalities: normal  Conduction Disutrbances:none  Narrative Interpretation:   Old EKG Reviewed: unchanged      Labs Reviewed  GLUCOSE, CAPILLARY - Abnormal; Notable for the following:    Glucose-Capillary 157 (*)     All other components within normal limits  CBC WITH DIFFERENTIAL - Abnormal; Notable for the following:    Neutrophils Relative 82 (*)     Neutro Abs 8.0 (*)     Lymphocytes Relative 11 (*)     All other components within normal limits  COMPREHENSIVE METABOLIC PANEL - Abnormal; Notable for the following:    Glucose, Bld 145 (*)     GFR calc non Af Amer 46 (*)     GFR calc Af Amer 53 (*)     All other components within normal limits  URINALYSIS, WITH MICROSCOPIC - Abnormal; Notable for the following:    Color, Urine AMBER (*)  BIOCHEMICALS MAY BE AFFECTED BY COLOR   APPearance CLOUDY (*)     Hgb urine dipstick TRACE (*)     Bilirubin Urine SMALL (*)     Ketones, ur TRACE (*)     Leukocytes, UA SMALL (*)     Bacteria, UA FEW (*)     Squamous Epithelial / LPF FEW (*)     Casts HYALINE CASTS (*)     All other components within normal limits  TROPONIN I          Remi Haggard, NP 12/05/11 1452

## 2011-12-05 NOTE — ED Notes (Signed)
Unable to obtain labs with IV start. Phlebotomist to be notified.

## 2011-12-06 ENCOUNTER — Ambulatory Visit (INDEPENDENT_AMBULATORY_CARE_PROVIDER_SITE_OTHER): Payer: Medicare Other | Admitting: Family

## 2011-12-06 ENCOUNTER — Encounter: Payer: Self-pay | Admitting: Family

## 2011-12-06 ENCOUNTER — Encounter: Payer: Self-pay | Admitting: Internal Medicine

## 2011-12-06 VITALS — BP 138/60 | HR 77 | Temp 98.1°F | Wt 159.0 lb

## 2011-12-06 DIAGNOSIS — R55 Syncope and collapse: Secondary | ICD-10-CM

## 2011-12-06 NOTE — ED Provider Notes (Signed)
Medical screening examination/treatment/procedure(s) were conducted as a shared visit with non-physician practitioner(s) and myself.  I personally evaluated the patient during the encounter.  Pt with a near syncopal episode. No cardiac sequelae. Occurred while defecating. Likely vasovagal. We will still get a ED cardiac workup, including troponin, EKG, CXR, in addition to orthostatics and BMP. If results normal, will be discharged.  Derwood Kaplan, MD 12/06/11 [phone]

## 2011-12-06 NOTE — Patient Instructions (Addendum)
Syncope You have had a fainting (syncopal) spell. A fainting episode is a sudden, short-lived loss of consciousness. It results in complete recovery. It occurs because there has been a temporary shortage of oxygen and/or sugar (glucose) to the brain. CAUSES   Blood pressure pills and other medications that may lower blood pressure below normal. Sudden changes in posture (sudden standing).   Over-medication. Take your medications as directed.   Standing too long. This can cause blood to pool in the legs.   Seizure disorders.   Low blood sugar (hypoglycemia) of diabetes. This more commonly causes coma.   Bearing down to go to the bathroom. This can cause your blood pressure to rise suddenly. Your body compensates by making the blood pressure too low when you stop bearing down.   Hardening of the arteries where the brain temporarily does not receive enough blood.   Irregular heart beat and circulatory problems.   Fear, emotional distress, injury, sight of blood, or illness.  Your caregiver will send you home if the syncope was from non-worrisome causes (benign). Depending on your age and health, you may stay to be monitored and observed. If you return home, have someone stay with you if your caregiver feels that is desirable. It is very important to keep all follow-up referrals and appointments in order to properly manage this condition. This is a serious problem which can lead to serious illness and death if not carefully managed.  WARNING: Do not drive or operate machinery until your caregiver feels that it is safe for you to do so. SEEK IMMEDIATE MEDICAL CARE IF:   You have another fainting episode or faint while lying or sitting down. DO NOT DRIVE YOURSELF. Call 911 if no other help is available.   You have chest pain, are feeling sick to your stomach (nausea), vomiting or abdominal pain.   You have an irregular heartbeat or one that is very fast (pulse over 120 beats per minute).    You have a loss of feeling in some part of your body or lose movement in your arms or legs.   You have difficulty with speech, confusion, severe weakness, or visual problems.   You become sweaty and/or feel light headed.  Make sure you are rechecked as instructed. Document Released: 04/11/2005 Document Revised: 03/31/2011 Document Reviewed: 11/30/2006 [employer] Patient Information 2012 [employer]. 

## 2011-12-07 ENCOUNTER — Encounter: Payer: Self-pay | Admitting: Family

## 2011-12-07 NOTE — Progress Notes (Signed)
Subjective:    Patient ID: [name] [name], female    DOB: April 10, 1925, 78 y.o.   MRN: [id number]  HPI  78 year old white female, nonsmoker is in today for followup from the emergency department. She was seen at the emergency department with a near syncopal episode. Patient reports sitting on the toilet at the hospital having a bowel movement and suddenly becoming weak and faint. She was escorted to the emergency department and workup for syncope. Her labs, EKG and other testing were all negative. Patient denies any constipation. Reports a pretty forceful stool there required little to no effort to bare down while she was defecating. Denies any lightheadedness, dizziness, chest pain, palpitations, shortness of breath or edema. Since his episode, she feels more fatigued than usual.  Review of Systems  Constitutional: Positive for fatigue. Negative for fever, chills and unexpected weight change.  HENT: Negative.   Eyes: Negative.   Respiratory: Negative.   Cardiovascular: Negative.  Negative for chest pain, palpitations and leg swelling.  Gastrointestinal: Negative.   Genitourinary: Negative.   Musculoskeletal: Negative.   Skin: Negative.   Neurological: Negative.   Hematological: Negative.   Psychiatric/Behavioral: Negative.    Past Medical History  Diagnosis Date  . GERD (gastroesophageal reflux disease)   . Hyperlipidemia   . History of colonic polyps   . Diverticulosis   . Shingles   . Eczema   . Allergic rhinoconjunctivitis   . Lung nodule 2011  . Environmental allergies     allergy shot- q friday in Dr. Roxy Cedar office. PFT's abnormal- recommended Spiriva to use preop & will d/c after surgery  . Arthritis     low back , stenosis  . Anxiety     pt. managed- uses deep breathing   . Stenosis of popliteal artery     blood clots in legs long ago    . COPD (chronic obstructive pulmonary disease)   . H/O hiatal hernia   . Hypertension     History   Social History  .  Marital Status: Single    Spouse Name: N/A    Number of Children: N/A  . Years of Education: N/A   Occupational History  . Not on file.   Social History Main Topics  . Smoking status: Never Smoker   . Smokeless tobacco: Not on file   Comment: QUIT IN 1995  . Alcohol Use: No  . Drug Use: No  . Sexually Active:    Other Topics Concern  . Not on file   Social History Narrative   ** Merged History Encounter **     Past Surgical History  Procedure Date  . Tubal ligation   . Cholecystectomy 1998  . Femoral-popliteal bypass graft        x2 surgeries 1990's & 2009  . Eye surgery     cataracts removed- bilateral /w IOL  . Tonsillectomy     as a teenager   . Tonsilectomy, adenoidectomy, bilateral myringotomy and tubes     no myringotomy tubes   . Lumbar fusion 07/06/2011    Family History  Problem Relation Age of Onset  . Arthritis Mother   . Heart disease Father   . Colon polyps Father   . Breast cancer Sister   . Lung cancer Sister   . Colon cancer Sister   . Lung cancer Brother   . Anesthesia problems Neg Hx   . Hypotension Neg Hx   . Malignant hyperthermia Neg Hx   . Pseudochol deficiency Neg Hx  Allergies  Allergen Reactions  . Latex     REACTION: rash  . Sulfa Antibiotics Other (See Comments)    Cold sweat light headed   . Sulfamethoxazole     REACTION: disorientation  . Tiotropium Bromide Other (See Comments)    Sore throat    Current Outpatient Prescriptions on File Prior to Visit  Medication Sig Dispense Refill  . aspirin 81 MG tablet Take 81 mg by mouth daily.      Marland Kitchen aspirin EC 81 MG tablet Take 81 mg by mouth daily.      . calcium-vitamin D (OSCAL WITH D 500-200) 500-200 MG-UNIT per tablet Take 1 tablet by mouth daily.        . calcium-vitamin D (OSCAL WITH D) 500-200 MG-UNIT per tablet Take 1 tablet by mouth 2 (two) times daily.      . carbamazepine (CARBATROL) 100 MG 12 hr capsule Take 100 mg by mouth 2 (two) times daily. Takes one at night  and may take a second in the morning for pain      . carbamazepine (CARBATROL) 100 MG 12 hr capsule Take 100 mg by mouth at bedtime.      . cholecalciferol (VITAMIN D) 1000 UNITS tablet Take 1,000 Units by mouth daily.      . Cholecalciferol (VITAMIN D3) 1000 UNITS CAPS Take 1,000 Units by mouth daily.       . meloxicam (MOBIC) 15 MG tablet Take 1 tablet (15 mg total) by mouth daily.  30 tablet  6  . meloxicam (MOBIC) 15 MG tablet Take 15 mg by mouth daily.      . Multiple Vitamin (MULITIVITAMIN WITH MINERALS) TABS Take 1 tablet by mouth daily.      . Multiple Vitamins-Minerals (MULTIVITAMIN WITH MINERALS) tablet Take 1 tablet by mouth daily.      Marland Kitchen omeprazole (PRILOSEC) 20 MG capsule Take 20 mg by mouth every morning.       Marland Kitchen omeprazole (PRILOSEC) 20 MG capsule Take 20 mg by mouth daily.      Marland Kitchen oxyCODONE-acetaminophen (PERCOCET) 10-325 MG per tablet Take 1 tablet by mouth 3 (three) times daily. For pain  90 tablet  0  . oxyCODONE-acetaminophen (PERCOCET) 10-325 MG per tablet Take 1 tablet by mouth every 4 (four) hours as needed. pain      . simvastatin (ZOCOR) 40 MG tablet Take 1 tablet (40 mg total) by mouth at bedtime.  90 tablet  3  . simvastatin (ZOCOR) 40 MG tablet Take 40 mg by mouth every evening.      . valsartan-hydrochlorothiazide (DIOVAN-HCT) 160-25 MG per tablet Take 1 tablet by mouth every morning.       . valsartan-hydrochlorothiazide (DIOVAN-HCT) 160-25 MG per tablet Take 1 tablet by mouth daily.        BP 138/60  Pulse 77  Temp 98.1 F (36.7 C) (Oral)  Wt 159 lb (72.122 kg)  SpO2 96%chart    Objective:   Physical Exam  Constitutional: She is oriented to person, place, and time. She appears well-developed and well-nourished.  Neck: Normal range of motion. Neck supple. No thyromegaly present.  Cardiovascular: Normal rate, regular rhythm and normal heart sounds.   Pulmonary/Chest: Effort normal and breath sounds normal.  Abdominal: Soft. Bowel sounds are normal.    Musculoskeletal: Normal range of motion.  Neurological: She is alert and oriented to person, place, and time.  Skin: Skin is warm and dry.  Psychiatric: She has a normal mood and affect.  Assessment & Plan:  Assessment: Near syncopal episode  Plan: Refer to cardiology for further management. Labs all stable. Patient to proceed to the emergency department if the symptoms recur. At this point, so she is medically stable.

## 2011-12-09 ENCOUNTER — Ambulatory Visit (INDEPENDENT_AMBULATORY_CARE_PROVIDER_SITE_OTHER): Payer: Medicare Other

## 2011-12-09 DIAGNOSIS — J309 Allergic rhinitis, unspecified: Secondary | ICD-10-CM | POA: Diagnosis not present

## 2011-12-16 ENCOUNTER — Ambulatory Visit (INDEPENDENT_AMBULATORY_CARE_PROVIDER_SITE_OTHER): Payer: Medicare Other

## 2011-12-16 DIAGNOSIS — J309 Allergic rhinitis, unspecified: Secondary | ICD-10-CM

## 2011-12-23 ENCOUNTER — Ambulatory Visit (INDEPENDENT_AMBULATORY_CARE_PROVIDER_SITE_OTHER): Payer: Medicare Other

## 2011-12-23 DIAGNOSIS — J309 Allergic rhinitis, unspecified: Secondary | ICD-10-CM

## 2011-12-29 ENCOUNTER — Encounter: Payer: Self-pay | Admitting: Cardiovascular Disease

## 2011-12-29 ENCOUNTER — Ambulatory Visit (INDEPENDENT_AMBULATORY_CARE_PROVIDER_SITE_OTHER): Payer: Medicare Other | Admitting: Cardiovascular Disease

## 2011-12-29 VITALS — BP 132/72 | HR 64 | Ht 62.0 in | Wt 159.0 lb

## 2011-12-29 DIAGNOSIS — R55 Syncope and collapse: Secondary | ICD-10-CM | POA: Diagnosis not present

## 2011-12-29 NOTE — Assessment & Plan Note (Signed)
Cholesterol is at goal.  Continue current dose of statin and diet Rx.  No myalgias or side effects.  F/U  LFT's in 6 months. Lab Results  Component Value Date   LDLCALC 77 11/24/2009

## 2011-12-29 NOTE — Assessment & Plan Note (Signed)
Well controlled.  Continue current medications and low sodium Dash type diet.    

## 2011-12-29 NOTE — Assessment & Plan Note (Signed)
Likely vagal episode ppt by oxycodone and going to bathroom.  F/U stress echo to make sure hemodynamic response to stress normal and no structural heart disease

## 2011-12-29 NOTE — Patient Instructions (Addendum)
Your physician recommends that you schedule a follow-up appointment in:   AS NEEDED   Your physician recommends that you continue on your current medications as directed. Please refer to the Current Medication list given to you today.   Your physician has requested that you have a stress echocardiogram. For further information please visit [email or web address]. Please follow instruction sheet as given.  

## 2011-12-29 NOTE — Progress Notes (Signed)
Patient ID: [name] [name], female   DOB: [birth year], 78 y.o.   MRN: [id number] 78 yo patient Dr Lovell Sheehan referred for syncopal episode.  Volunteers at hospital.  8/12 seen in ER.  Took oxycodine and had vagal episode in bathroom.  Reviewed records. R/O no arrhythmia and labs ok.  ECG no acute changes or heart block.  No recurrence.  Had not taken oxycodone for a while had it for back surgery done in March.  No previous cardiac disease.  No recurrence.  Denies fever, postural symptoms, palpitations or chest pain.  Felt better after about an hour  Dizzyness associated with nausea and diaphoresis  ROS: Denies fever, malais, weight loss, blurry vision, decreased visual acuity, cough, sputum, SOB, hemoptysis, pleuritic pain, palpitaitons, heartburn, abdominal pain, melena, lower extremity edema, claudication, or rash.  All other systems reviewed and negative   General: Affect appropriate Healthy:  appears stated age HEENT: normal Neck supple with no adenopathy JVP normal no bruits no thyromegaly Lungs clear with no wheezing and good diaphragmatic motion Heart:  S1/S2 no murmur,rub, gallop or click PMI normal Abdomen: benighn, BS positve, no tenderness, no AAA no bruit.  No HSM or HJR Distal pulses intact with no bruits No edema Neuro non-focal Skin warm and dry No muscular weakness  Medications Current Outpatient Prescriptions  Medication Sig Dispense Refill  . aspirin 81 MG tablet Take 81 mg by mouth daily.      . calcium-vitamin D (OSCAL WITH D 500-200) 500-200 MG-UNIT per tablet Take 1 tablet by mouth daily.        . carbamazepine (CARBATROL) 100 MG 12 hr capsule Take 100 mg by mouth 2 (two) times daily. Takes one at night and may take a second in the morning for pain      . Cholecalciferol (VITAMIN D3) 1000 UNITS CAPS Take 1,000 Units by mouth daily.       . meloxicam (MOBIC) 15 MG tablet Take 1 tablet (15 mg total) by mouth daily.  30 tablet  6  . Multiple Vitamin (MULITIVITAMIN WITH  MINERALS) TABS Take 1 tablet by mouth daily.      . simvastatin (ZOCOR) 40 MG tablet Take 1 tablet (40 mg total) by mouth at bedtime.  90 tablet  3  . valsartan-hydrochlorothiazide (DIOVAN-HCT) 160-25 MG per tablet Take 1 tablet by mouth every morning.       Marland Kitchen omeprazole (PRILOSEC) 20 MG capsule Take 20 mg by mouth every morning.       Marland Kitchen DISCONTD: calcium-vitamin D (OSCAL WITH D) 500-200 MG-UNIT per tablet Take 1 tablet by mouth 2 (two) times daily.      Marland Kitchen DISCONTD: carbamazepine (CARBATROL) 100 MG 12 hr capsule Take 100 mg by mouth at bedtime.      Marland Kitchen DISCONTD: omeprazole (PRILOSEC) 20 MG capsule Take 20 mg by mouth daily.      Marland Kitchen DISCONTD: simvastatin (ZOCOR) 40 MG tablet Take 40 mg by mouth every evening.      Marland Kitchen DISCONTD: valsartan-hydrochlorothiazide (DIOVAN-HCT) 160-25 MG per tablet Take 1 tablet by mouth daily.        Allergies Latex; Sulfa antibiotics; Sulfamethoxazole; and Tiotropium bromide  Family History: Family History  Problem Relation Age of Onset  . Arthritis Mother   . Heart disease Father   . Colon polyps Father   . Breast cancer Sister   . Lung cancer Sister   . Colon cancer Sister   . Lung cancer Brother   . Anesthesia problems Neg Hx   .  Hypotension Neg Hx   . Malignant hyperthermia Neg Hx   . Pseudochol deficiency Neg Hx     Social History: History   Social History  . Marital Status: Single    Spouse Name: N/A    Number of Children: N/A  . Years of Education: N/A   Occupational History  . Not on file.   Social History Main Topics  . Smoking status: Never Smoker   . Smokeless tobacco: Not on file   Comment: QUIT IN 1995  . Alcohol Use: No  . Drug Use: No  . Sexually Active:    Other Topics Concern  . Not on file   Social History Narrative   ** Merged History Encounter **     Electrocardiogram:  8/12  NSR no actue changes or heart block  Assessment and Plan

## 2011-12-30 ENCOUNTER — Ambulatory Visit (INDEPENDENT_AMBULATORY_CARE_PROVIDER_SITE_OTHER): Payer: Medicare Other

## 2011-12-30 DIAGNOSIS — J309 Allergic rhinitis, unspecified: Secondary | ICD-10-CM | POA: Diagnosis not present

## 2012-01-03 ENCOUNTER — Ambulatory Visit (INDEPENDENT_AMBULATORY_CARE_PROVIDER_SITE_OTHER)
Admission: RE | Admit: 2012-01-03 | Discharge: 2012-01-03 | Disposition: A | Discharge status: Home / Self Care | Payer: Medicare Other | Source: Ambulatory Visit | Attending: Internal Medicine | Admitting: Internal Medicine

## 2012-01-03 DIAGNOSIS — R911 Solitary pulmonary nodule: Secondary | ICD-10-CM

## 2012-01-03 DIAGNOSIS — R918 Other nonspecific abnormal finding of lung field: Secondary | ICD-10-CM | POA: Diagnosis not present

## 2012-01-04 ENCOUNTER — Telehealth: Payer: Self-pay | Admitting: Internal Medicine

## 2012-01-04 NOTE — Telephone Encounter (Signed)
Calling was calling to inform pt:  Notes Recorded by Waymon Budge, MD on 01/03/2012 at 7:38 PM CT chest- old nodules are stable. There are new, small nodules that may be from a kind of slow infection which we can discuss at next ov.  There is also evidence of coronary artery disease. I will notify Dr Eden Emms cardiology.  ------  Called, spoke with pt.  Informed her of above per Dr. Maple Hudson.  She verbalized understanding of this and is aware of pending OV with Dr. Maple Hudson on 02/13/12 at 10 am.

## 2012-01-04 NOTE — Telephone Encounter (Signed)
Pt states she is returning [name]'s call.  [name] [name]

## 2012-01-04 NOTE — Progress Notes (Signed)
Quick Note:  LMTCB ______ 

## 2012-01-04 NOTE — Progress Notes (Signed)
Quick Note:  Spoke with pt. Informed her of CT Chest results per Dr. Maple Hudson. She verbalized understanding of this and is aware of pending OV with [facility] on 02/13/12 at 10am. Advised to pls call back if anything is needed prior to this. ______

## 2012-01-06 ENCOUNTER — Ambulatory Visit (INDEPENDENT_AMBULATORY_CARE_PROVIDER_SITE_OTHER): Payer: Medicare Other

## 2012-01-06 DIAGNOSIS — J309 Allergic rhinitis, unspecified: Secondary | ICD-10-CM

## 2012-01-09 ENCOUNTER — Ambulatory Visit (HOSPITAL_COMMUNITY): Payer: Medicare Other | Attending: Internal Medicine

## 2012-01-09 ENCOUNTER — Telehealth: Payer: Self-pay | Admitting: Internal Medicine

## 2012-01-09 ENCOUNTER — Encounter: Payer: Self-pay | Admitting: Cardiovascular Disease

## 2012-01-09 DIAGNOSIS — I10 Essential (primary) hypertension: Secondary | ICD-10-CM | POA: Insufficient documentation

## 2012-01-09 DIAGNOSIS — G500 Trigeminal neuralgia: Secondary | ICD-10-CM | POA: Diagnosis not present

## 2012-01-09 DIAGNOSIS — R55 Syncope and collapse: Secondary | ICD-10-CM

## 2012-01-09 DIAGNOSIS — Z8249 Family history of ischemic heart disease and other diseases of the circulatory system: Secondary | ICD-10-CM | POA: Diagnosis not present

## 2012-01-09 DIAGNOSIS — J4489 Other specified chronic obstructive pulmonary disease: Secondary | ICD-10-CM | POA: Insufficient documentation

## 2012-01-09 DIAGNOSIS — J449 Chronic obstructive pulmonary disease, unspecified: Secondary | ICD-10-CM | POA: Insufficient documentation

## 2012-01-09 DIAGNOSIS — I7389 Other specified peripheral vascular diseases: Secondary | ICD-10-CM | POA: Diagnosis not present

## 2012-01-09 DIAGNOSIS — R0989 Other specified symptoms and signs involving the circulatory and respiratory systems: Secondary | ICD-10-CM

## 2012-01-09 NOTE — Telephone Encounter (Signed)
Spoke with patient-states that she wants to discuss CT results in more detail as she is concerned with being told she has new spots(nodules). Pt is coming in on Thursday 01-12-12 at 10:15am.

## 2012-01-09 NOTE — Progress Notes (Signed)
Echocardiogram performed.  

## 2012-01-10 DIAGNOSIS — G500 Trigeminal neuralgia: Secondary | ICD-10-CM | POA: Diagnosis not present

## 2012-01-12 ENCOUNTER — Ambulatory Visit (INDEPENDENT_AMBULATORY_CARE_PROVIDER_SITE_OTHER): Payer: Medicare Other | Admitting: Internal Medicine

## 2012-01-12 ENCOUNTER — Encounter: Payer: Self-pay | Admitting: Internal Medicine

## 2012-01-12 ENCOUNTER — Ambulatory Visit (INDEPENDENT_AMBULATORY_CARE_PROVIDER_SITE_OTHER): Payer: Medicare Other

## 2012-01-12 VITALS — BP 126/62 | HR 63 | Ht 62.0 in | Wt 168.0 lb

## 2012-01-12 DIAGNOSIS — R918 Other nonspecific abnormal finding of lung field: Secondary | ICD-10-CM

## 2012-01-12 DIAGNOSIS — J301 Allergic rhinitis due to pollen: Secondary | ICD-10-CM | POA: Diagnosis not present

## 2012-01-12 DIAGNOSIS — J984 Other disorders of lung: Secondary | ICD-10-CM

## 2012-01-12 DIAGNOSIS — J309 Allergic rhinitis, unspecified: Secondary | ICD-10-CM

## 2012-01-12 NOTE — Patient Instructions (Addendum)
I suspect the new patch of tiny nodules is a kind of slow infection called "atypical AFB or Mycobacterium avium (MAIC). There is no intervention needed now, but we want to see how it acts going forward.  Order- CT chest , no contrast  To be done in 3 months    Dx lung nodules

## 2012-01-12 NOTE — Progress Notes (Signed)
Patient ID: TRESS MOLTER, female    DOB: 1925/04/21, 78 y.o.   MRN: [id number]  HPI 08/02/10-85 yoF former smoker, followed here for allergic rhinitis on allergy vaccine, and COPD. She is bothered on chronic basis by her watery, burning eyes. She just saw Dr Dione Booze, with all his drops tried. She is worse in past 1-2 months with Spring pollen season. She is resigned to staying this way. Seeing Dr Terri Piedra for significant eczema of fingertips.  She remains satisfied that the allergy vaccine is helpful and worth continuing.  01/31/11-  86 yoF former smoker, followed here for allergic rhinitis on allergy vaccine, and COPD, lung nodules. She is bothered on chronic basis by her watery, burning eyes. Acute visit-ill for past 2 days. Complains of pressure in her ears, weakness and shakiness, headache, nausea. Eyes always water when she goes outdoors. She denies fever, swollen glands, sore throat, discolored sputum, chest tightness or wheeze. She has been on tramadol for several weeks and asks if that might be what started causing her to feel badly just a few days ago. CT chest 12/07/10-  IMPRESSION:  1. Compared with previous exam the previously referenced pulmonary  nodules in the right middle lobe have not changed in size.  Recommend continued follow-up to two years from initial study  01/15/2010.  Original Report Authenticated By: Rosealee Albee, M.D.   08/15/11- 77 yoF former smoker, followed here for allergic rhinitis on allergy vaccine, and COPD, lung nodules. She is bothered on chronic basis by her watery, burning eyes. Off of allergy vaccine for 6 weeks because of back surgery. Wants to resume and we discussed risk and safety considerations and alternatives. Allegra helps. Previously identified lung nodules, back to 2011. She had chest x-ray in hospital 06/27/2011 with stable nodularity right middle lobe. That completes 2 years of surveillance.  01/12/12- 35 yoF former smoker, followed here for  allergic rhinitis on allergy vaccine, and COPD, lung nodules. 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 Since last here had lumbar spine surgery, doing well. Evaluated at emergency room for vasovagal syncope and had normal stress test by her report, this week. Continues allergy vaccine 1:10 GH. Discussed  supplemental antihistamines. PPD skin test was negative this year. CT-01/04/12-images reviewed with her. She IMPRESSION:  1. While the two right middle lobe nodules noted on prior  examinations are unchanged, there is a new cluster of  peribronchovascular micronodules in the adjacent lateral segment of  the right middle lobe. Overall, this spectrum of findings strongly  favors a benign process (likely infectious disease), possibly  mycobacterial infection. These new nodules are highly unlikely to  be neoplastic, and the old nodules now demonstrate 2 years of  stability and can be considered radiographically benign. No other  suspicious appearing pulmonary nodules or masses are otherwise  identified.  2. Atherosclerosis, including left main and three-vessel coronary  artery disease.  3. Additional incidental findings, as above.  Original Report Authenticated By: Florencia Reasons, M.D.   Review of Systems-See HPI Constitutional:   No-   weight loss, night sweats, fevers, chills,+ fatigue, lassitude. HEENT:   No-  headaches, difficulty swallowing, tooth/dental problems, sore throat,       No-  sneezing, itching, ear ache, nasal congestion, post nasal drip,  CV:  No-   chest pain, orthopnea, PND, swelling in lower extremities, anasarca, dizziness, palpitations Resp: No-   shortness of  breath with exertion or at rest.              No-   productive cough,  No non-productive cough,  No-  coughing up of blood.              No-   change in color of mucus.  No- wheezing.   Skin: No-   rash or  lesions. GI:  No-   heartburn, indigestion, abdominal pain, + nausea, GU: . MS:  No-   joint pain or swelling.  Neuro- +recent evaluation for vasovagal syncope Psych:  No- change in mood or affect. No depression or anxiety.  No memory loss.    Objective:   Physical Exam General- Alert, Oriented, Affect-appropriate, Distress- none acute Skin- rash-none, lesions- none, excoriation- none Lymphadenopathy- none Head- atraumatic            Eyes- Gross vision intact, PERRLA, conjunctivae clear secretions            Ears- Hearing, canals-normal            Nose- Clear, no-Septal dev, mucus, polyps, erosion, perforation             Throat- Mallampati II , mucosa clear , drainage- none, tonsils- atrophic Neck- flexible , trachea midline, no stridor , thyroid nl, carotid no bruit Chest - symmetrical excursion , unlabored           Heart/CV- RRR , no murmur , no gallop  , no rub, nl s1 s2                           - JVD- none , edema- none, stasis changes- none, varices- none           Lung- clear to P&A, wheeze- none, cough- none , dullness-none, rub- none           Chest wall- wearing back brace after back surgery Abd-  Br/ Gen/ Rectal- Not done, not indicated Extrem- cyanosis- none, clubbing, none, atrophy- none, strength- nl Neuro- grossly intact to observation; mildly tremulous

## 2012-01-16 ENCOUNTER — Telehealth: Payer: Self-pay | Admitting: Internal Medicine

## 2012-01-16 ENCOUNTER — Encounter: Payer: Self-pay | Admitting: Internal Medicine

## 2012-01-16 NOTE — Telephone Encounter (Signed)
Pt will get a flu shot at [facility] on 01-19-2012

## 2012-01-16 NOTE — Telephone Encounter (Signed)
Chest ct @[facility]  04/03/12@9 :30am pt is aware [name] [name]

## 2012-01-17 NOTE — Telephone Encounter (Signed)
noted 

## 2012-01-18 NOTE — Progress Notes (Signed)
  Subjective:    Patient ID: [name] [name], female    DOB: [birth year], 78 y.o.   MRN: [id number]  HPI  Patient presents to discuss options for intractable back pain  in severe pain she is tearful unable to cope with pain  Review of Systems     Objective:   Physical Exam        Assessment & Plan:  Discussed options of pain management including referral for neurosurgical evaluation

## 2012-01-20 ENCOUNTER — Ambulatory Visit (INDEPENDENT_AMBULATORY_CARE_PROVIDER_SITE_OTHER): Payer: Medicare Other

## 2012-01-20 DIAGNOSIS — J309 Allergic rhinitis, unspecified: Secondary | ICD-10-CM

## 2012-01-21 ENCOUNTER — Other Ambulatory Visit: Payer: Self-pay | Admitting: Internal Medicine

## 2012-01-22 ENCOUNTER — Encounter: Payer: Self-pay | Admitting: Internal Medicine

## 2012-01-22 NOTE — Assessment & Plan Note (Signed)
The 2 older nodules are almost certainly benign of the followed for 2 years as discussed. The new process may be atypical AFB. Since she has no sputum, fever or sweats, we will watch it.

## 2012-01-22 NOTE — Assessment & Plan Note (Signed)
She believes allergy vaccine helps and is well-tolerated. She is occasionally needing supplemental antihistamine.

## 2012-01-23 ENCOUNTER — Ambulatory Visit (INDEPENDENT_AMBULATORY_CARE_PROVIDER_SITE_OTHER): Payer: Medicare Other | Admitting: Internal Medicine

## 2012-01-23 ENCOUNTER — Encounter: Payer: Self-pay | Admitting: Internal Medicine

## 2012-01-23 VITALS — BP 132/80 | HR 72 | Temp 98.2°F | Resp 16 | Ht 62.0 in | Wt 166.0 lb

## 2012-01-23 DIAGNOSIS — I10 Essential (primary) hypertension: Secondary | ICD-10-CM | POA: Diagnosis not present

## 2012-01-23 DIAGNOSIS — T887XXA Unspecified adverse effect of drug or medicament, initial encounter: Secondary | ICD-10-CM | POA: Diagnosis not present

## 2012-01-23 DIAGNOSIS — E785 Hyperlipidemia, unspecified: Secondary | ICD-10-CM | POA: Diagnosis not present

## 2012-01-23 NOTE — Progress Notes (Signed)
  Subjective:    Patient ID: [name] [name], female    DOB: [birth year], 78 y.o.   MRN: [id number]  HPI The patient has a syncopal episode and  Was thought to have a vasovagal  Episode. She saw Dr Eden Emms who did a cardiolyte. The patient had her "CT" of lungs and she has an atypical infection in lungs... It appears that the pulmonologist thinks that this is MAC and wants to watch the infection with a repeat CT before ant therapeutic options are discussed.  This increases risk for certain drugs such as prednisone.   Review of Systems  Constitutional: Positive for fatigue. Negative for activity change and appetite change.  HENT: Negative for ear pain, congestion, neck pain, postnasal drip and sinus pressure.   Eyes: Negative for redness and visual disturbance.  Respiratory: Positive for cough. Negative for shortness of breath and wheezing.   Gastrointestinal: Negative for abdominal pain and abdominal distention.  Genitourinary: Negative for dysuria, frequency and menstrual problem.  Musculoskeletal: Negative for myalgias, joint swelling and arthralgias.  Skin: Negative for rash and wound.  Neurological: Positive for weakness and light-headedness. Negative for dizziness and headaches.  Hematological: Negative for adenopathy. Does not bruise/bleed easily.  Psychiatric/Behavioral: Negative for disturbed wake/sleep cycle and decreased concentration.       Objective:   Physical Exam  Nursing note and vitals reviewed. Constitutional: She is oriented to person, place, and time. She appears well-developed and well-nourished. No distress.  HENT:  Head: Normocephalic and atraumatic.  Right Ear: External ear normal.  Left Ear: External ear normal.  Nose: Nose normal.  Mouth/Throat: Oropharynx is clear and moist.  Eyes: Conjunctivae normal and EOM are normal. Pupils are equal, round, and reactive to light.  Neck: Normal range of motion. Neck supple. No JVD present. No tracheal deviation  present. No thyromegaly present.  Cardiovascular: Normal rate, regular rhythm and intact distal pulses.   Murmur heard. Pulmonary/Chest: Effort normal and breath sounds normal. She has no wheezes. She exhibits no tenderness.  Abdominal: Soft. Bowel sounds are normal.  Musculoskeletal: Normal range of motion. She exhibits tenderness. She exhibits no edema.  Lymphadenopathy:    She has no cervical adenopathy.  Neurological: She is alert and oriented to person, place, and time. She has normal reflexes. No cranial nerve deficit.  Skin: Skin is warm and dry. She is not diaphoretic.  Psychiatric: She has a normal mood and affect. Her behavior is normal.          Assessment & Plan:  The radicular pain is gone and she has mild arthriotic pain that is relieved with NSAIDS She probable has MAC and will have an CT in 3 months If stable no treatment recommended. Syncope... Resolved

## 2012-01-23 NOTE — Patient Instructions (Signed)
The patient is instructed to continue all medications as prescribed. Schedule followup with check out clerk upon leaving the clinic  

## 2012-01-27 ENCOUNTER — Ambulatory Visit (INDEPENDENT_AMBULATORY_CARE_PROVIDER_SITE_OTHER): Payer: Medicare Other

## 2012-01-27 DIAGNOSIS — J309 Allergic rhinitis, unspecified: Secondary | ICD-10-CM

## 2012-01-30 ENCOUNTER — Encounter: Payer: Self-pay | Admitting: Internal Medicine

## 2012-01-31 ENCOUNTER — Ambulatory Visit (INDEPENDENT_AMBULATORY_CARE_PROVIDER_SITE_OTHER): Payer: Medicare Other

## 2012-01-31 DIAGNOSIS — J309 Allergic rhinitis, unspecified: Secondary | ICD-10-CM

## 2012-02-03 ENCOUNTER — Ambulatory Visit (INDEPENDENT_AMBULATORY_CARE_PROVIDER_SITE_OTHER): Payer: Medicare Other

## 2012-02-03 DIAGNOSIS — J309 Allergic rhinitis, unspecified: Secondary | ICD-10-CM

## 2012-02-10 ENCOUNTER — Ambulatory Visit (INDEPENDENT_AMBULATORY_CARE_PROVIDER_SITE_OTHER): Payer: Medicare Other

## 2012-02-10 DIAGNOSIS — J309 Allergic rhinitis, unspecified: Secondary | ICD-10-CM | POA: Diagnosis not present

## 2012-02-13 ENCOUNTER — Ambulatory Visit: Payer: Medicare Other | Admitting: Internal Medicine

## 2012-02-17 ENCOUNTER — Ambulatory Visit (INDEPENDENT_AMBULATORY_CARE_PROVIDER_SITE_OTHER): Payer: Medicare Other

## 2012-02-17 DIAGNOSIS — J309 Allergic rhinitis, unspecified: Secondary | ICD-10-CM | POA: Diagnosis not present

## 2012-02-24 ENCOUNTER — Ambulatory Visit (INDEPENDENT_AMBULATORY_CARE_PROVIDER_SITE_OTHER): Payer: Medicare Other

## 2012-02-24 DIAGNOSIS — J309 Allergic rhinitis, unspecified: Secondary | ICD-10-CM | POA: Diagnosis not present

## 2012-03-02 ENCOUNTER — Ambulatory Visit (INDEPENDENT_AMBULATORY_CARE_PROVIDER_SITE_OTHER): Payer: Medicare Other

## 2012-03-02 DIAGNOSIS — J309 Allergic rhinitis, unspecified: Secondary | ICD-10-CM

## 2012-03-09 ENCOUNTER — Ambulatory Visit (INDEPENDENT_AMBULATORY_CARE_PROVIDER_SITE_OTHER): Payer: Medicare Other

## 2012-03-09 DIAGNOSIS — J309 Allergic rhinitis, unspecified: Secondary | ICD-10-CM

## 2012-03-15 DIAGNOSIS — Z803 Family history of malignant neoplasm of breast: Secondary | ICD-10-CM | POA: Diagnosis not present

## 2012-03-15 DIAGNOSIS — Z1231 Encounter for screening mammogram for malignant neoplasm of breast: Secondary | ICD-10-CM | POA: Diagnosis not present

## 2012-03-16 ENCOUNTER — Ambulatory Visit (INDEPENDENT_AMBULATORY_CARE_PROVIDER_SITE_OTHER): Payer: Medicare Other

## 2012-03-16 DIAGNOSIS — J309 Allergic rhinitis, unspecified: Secondary | ICD-10-CM | POA: Diagnosis not present

## 2012-03-23 ENCOUNTER — Encounter: Payer: Self-pay | Admitting: Internal Medicine

## 2012-03-23 ENCOUNTER — Ambulatory Visit (INDEPENDENT_AMBULATORY_CARE_PROVIDER_SITE_OTHER): Payer: Medicare Other

## 2012-03-23 DIAGNOSIS — J309 Allergic rhinitis, unspecified: Secondary | ICD-10-CM | POA: Diagnosis not present

## 2012-03-30 ENCOUNTER — Ambulatory Visit (INDEPENDENT_AMBULATORY_CARE_PROVIDER_SITE_OTHER): Payer: Medicare Other

## 2012-03-30 DIAGNOSIS — J309 Allergic rhinitis, unspecified: Secondary | ICD-10-CM | POA: Diagnosis not present

## 2012-04-03 ENCOUNTER — Ambulatory Visit (INDEPENDENT_AMBULATORY_CARE_PROVIDER_SITE_OTHER)
Admission: RE | Admit: 2012-04-03 | Discharge: 2012-04-03 | Disposition: A | Discharge status: Home / Self Care | Payer: Medicare Other | Source: Ambulatory Visit | Attending: Internal Medicine | Admitting: Internal Medicine

## 2012-04-03 DIAGNOSIS — R918 Other nonspecific abnormal finding of lung field: Secondary | ICD-10-CM

## 2012-04-06 ENCOUNTER — Ambulatory Visit (INDEPENDENT_AMBULATORY_CARE_PROVIDER_SITE_OTHER): Payer: Medicare Other

## 2012-04-06 DIAGNOSIS — J309 Allergic rhinitis, unspecified: Secondary | ICD-10-CM

## 2012-04-10 ENCOUNTER — Ambulatory Visit: Payer: Self-pay | Admitting: Internal Medicine

## 2012-04-13 ENCOUNTER — Ambulatory Visit (INDEPENDENT_AMBULATORY_CARE_PROVIDER_SITE_OTHER): Payer: Medicare Other

## 2012-04-13 DIAGNOSIS — J309 Allergic rhinitis, unspecified: Secondary | ICD-10-CM | POA: Diagnosis not present

## 2012-04-16 ENCOUNTER — Other Ambulatory Visit: Payer: Self-pay

## 2012-04-17 ENCOUNTER — Other Ambulatory Visit (INDEPENDENT_AMBULATORY_CARE_PROVIDER_SITE_OTHER): Payer: Medicare Other

## 2012-04-17 DIAGNOSIS — T887XXA Unspecified adverse effect of drug or medicament, initial encounter: Secondary | ICD-10-CM | POA: Diagnosis not present

## 2012-04-17 DIAGNOSIS — I10 Essential (primary) hypertension: Secondary | ICD-10-CM

## 2012-04-17 DIAGNOSIS — E785 Hyperlipidemia, unspecified: Secondary | ICD-10-CM

## 2012-04-17 LAB — HEPATIC FUNCTION PANEL: Albumin: 4.3 g/dL (ref 3.5–5.2)

## 2012-04-17 LAB — LIPID PANEL
Cholesterol: 170 mg/dL (ref 0–200)
HDL: 65 mg/dL (ref >39.00)
VLDL: 30.2 mg/dL (ref 0.0–40.0)

## 2012-04-17 LAB — BASIC METABOLIC PANEL
BUN: 24 mg/dL — ABNORMAL HIGH (ref 6–23)
CO2: 27 mEq/L (ref 19–32)
Calcium: 10 mg/dL (ref 8.4–10.5)
GFR: 49.6 mL/min — ABNORMAL LOW (ref >60.00)
Glucose, Bld: 135 mg/dL — ABNORMAL HIGH (ref 70–99)
Potassium: 5.2 mEq/L — ABNORMAL HIGH (ref 3.5–5.1)

## 2012-04-19 ENCOUNTER — Ambulatory Visit (INDEPENDENT_AMBULATORY_CARE_PROVIDER_SITE_OTHER): Payer: Medicare Other | Admitting: Internal Medicine

## 2012-04-19 ENCOUNTER — Encounter: Payer: Self-pay | Admitting: Internal Medicine

## 2012-04-19 VITALS — BP 146/60 | HR 68 | Ht 62.0 in | Wt 178.4 lb

## 2012-04-19 DIAGNOSIS — R1310 Dysphagia, unspecified: Secondary | ICD-10-CM | POA: Diagnosis not present

## 2012-04-19 DIAGNOSIS — J984 Other disorders of lung: Secondary | ICD-10-CM

## 2012-04-19 DIAGNOSIS — J449 Chronic obstructive pulmonary disease, unspecified: Secondary | ICD-10-CM

## 2012-04-19 DIAGNOSIS — J301 Allergic rhinitis due to pollen: Secondary | ICD-10-CM

## 2012-04-19 DIAGNOSIS — J4489 Other specified chronic obstructive pulmonary disease: Secondary | ICD-10-CM

## 2012-04-19 MED ORDER — FLUTICASONE FUROATE-VILANTEROL 100-25 MCG/INH IN AEPB: 1.0000 | INHALATION_SPRAY | Freq: Every day | RESPIRATORY_TRACT | Status: DC

## 2012-04-19 NOTE — Patient Instructions (Addendum)
Order- Speech Therapy supervised Modified Barium Swallow  For problem choking with meals, dysphagia  Order- Future schedule CT chest no contrast in 6 months, before next ov here  Dx lung nodules  I encourage you to restart Silver Sneakers. We would like to get some stamina back. Sample Breo Ellipta inhaler      1 puff then rinse mouth, one time daily

## 2012-04-19 NOTE — Progress Notes (Signed)
Patient ID: [name] [name], female    DOB: September 27, 1924, 78 y.o.   MRN: [id number]  HPI 08/02/10-85 yoF former smoker, followed here for allergic rhinitis on allergy vaccine, and COPD. She is bothered on chronic basis by her watery, burning eyes. She just saw Dr Katy Fitch, with all his drops tried. She is worse in past 1-2 months with Spring pollen season. She is resigned to staying this way. Seeing Dr Allyson Sabal for significant eczema of fingertips.  She remains satisfied that the allergy vaccine is helpful and worth continuing.  01/31/11-  86 yoF former smoker, followed here for allergic rhinitis on allergy vaccine, and COPD, lung nodules. She is bothered on chronic basis by her watery, burning eyes. Acute visit-ill for past 2 days. Complains of pressure in her ears, weakness and shakiness, headache, nausea. Eyes always water when she goes outdoors. She denies fever, swollen glands, sore throat, discolored sputum, chest tightness or wheeze. She has been on tramadol for several weeks and asks if that might be what started causing her to feel badly just a few days ago. CT chest 12/07/10-  IMPRESSION:  1. Compared with previous exam the previously referenced pulmonary  nodules in the right middle lobe have not changed in size.  Recommend continued follow-up to two years from initial study  01/15/2010.  Original Report Authenticated By: [name], M.D.   08/15/11- 75 yoF former smoker, followed here for allergic rhinitis on allergy vaccine, and COPD, lung nodules. She is bothered on chronic basis by her watery, burning eyes. Off of allergy vaccine for 6 weeks because of back surgery. Wants to resume and we discussed risk and safety considerations and alternatives. Allegra helps. Previously identified lung nodules, back to 2011. She had chest x-ray in hospital 06/27/2011 with stable nodularity right middle lobe. That completes 2 years of surveillance.  01/12/12- 14 yoF former smoker, followed here for  allergic rhinitis on allergy vaccine, and COPD, lung nodules. 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 Since last here had lumbar spine surgery, doing well. Evaluated at emergency room for vasovagal syncope and had normal stress test by her report, this week. Continues allergy vaccine 1:10 GH. Discussed  supplemental antihistamines. PPD skin test was negative this year. CT-01/04/12-images reviewed with her. She IMPRESSION:  1. While the two right middle lobe nodules noted on prior  examinations are unchanged, there is a new cluster of  peribronchovascular micronodules in the adjacent lateral segment of  the right middle lobe. Overall, this spectrum of findings strongly  favors a benign process (likely infectious disease), possibly  mycobacterial infection. These new nodules are highly unlikely to  be neoplastic, and the old nodules now demonstrate 2 years of  stability and can be considered radiographically benign. No other  suspicious appearing pulmonary nodules or masses are otherwise  identified.  2. Atherosclerosis, including left main and three-vessel coronary  artery disease.  3. Additional incidental findings, as above.  Original Report Authenticated By: [name], M.D.   04/19/12- 14 yoF former smoker, followed here for allergic rhinitis on allergy vaccine, and COPD, lung nodules. FOLLOWS FOR: Pt c/o increased SOB with exertion. Denies cough, wheezing, chest tightness.  She chokes easily on her food, which she finds "aggravating". Otherwise does not usually cough reports no phlegm, fever or sweat. No heartburn or food hanging up. Maybe a little more shortness of breath on exertion over the last  month or 2, climbing stairs. She had a normal stress echocardiogram documented below. She recognized improved stamina when she was going regularly to [employer], but she drifted away from  that over the holidays. Notes Recorded by Wendall Stade, MD on 01/10/2012: Normal stress echo  CT 04/11/12- IMPRESSION:  1. Findings, as above, remain consistent with a chronic indolent  atypical infectious process, likely a mycobacterial infection. The  cluster of peribronchovascular micronodules in the lateral segment  of the right middle lobe that was new on the prior examination is  persistent, however, the largest nodule within this cluster has  resolved. No findings suspicious for malignancy are seen on  today's examination.  2. Atherosclerosis, including left main and three-vessel coronary  artery disease.  3. Dilatation of the pulmonic trunk (3.8 cm in diameter), which  may suggest pulmonary arterial hypertension.  4. Additional incidental findings, as above.  Original Report Authenticated By: Trudie Reed, M.D.  PFT: 05/24/2011 moderate obstructive airways disease with response to bronchodilator, air trapping, diffusion moderately reduced. FEV1 1.12/75%, FEV1/FVC 0.60, FEF 25-75% 0.47/27%. TLC 117% RV 168%, DLCO 57%.   Review of Systems-See HPI Constitutional:   No-   weight loss, night sweats, fevers, chills,+ fatigue, lassitude. HEENT:   No-  headaches, difficulty swallowing, tooth/dental problems, sore throat,       No-  sneezing, itching, ear ache, nasal congestion, post nasal drip,  CV:  No-   chest pain, orthopnea, PND, swelling in lower extremities, anasarca, dizziness, palpitations Resp: +shortness of breath with exertion or at rest.              No-   productive cough,  No non-productive cough,  No-  coughing up of blood.              No-   change in color of mucus.  No- wheezing.   Skin: No-   rash or lesions. GI:  No-   heartburn, indigestion, abdominal pain, + nausea, GU: . MS:  No-   joint pain or swelling.  Neuro- +recent evaluation for vasovagal syncope Psych:  No- change in mood or affect. No depression or anxiety.  No memory loss.    Objective:    Physical Exam General- Alert, Oriented, Affect-appropriate, Distress- none acute Skin- rash-none, lesions- none, excoriation- none Lymphadenopathy- none Head- atraumatic            Eyes- Gross vision intact, PERRLA, conjunctivae clear secretions            Ears- Hearing, canals-normal            Nose- Clear, no-Septal dev, mucus, polyps, erosion, perforation             Throat- Mallampati II , mucosa clear , drainage- none, tonsils- atrophic Neck- flexible , trachea midline, no stridor , thyroid nl, carotid no bruit Chest - symmetrical excursion , unlabored           Heart/CV- RRR , no murmur , no gallop  , no rub, nl s1 s2                           - JVD- none , edema- none, stasis changes- none, varices- none           Lung- clear to P&A, wheeze- none, cough- none , dullness-none, rub- none           Chest wall-  Abd-  Br/ Gen/ Rectal- Not done, not indicated Extrem-  cyanosis- none, clubbing, none, atrophy- none, strength- nl Neuro- grossly intact to observation; mildly tremulous

## 2012-04-20 ENCOUNTER — Ambulatory Visit (INDEPENDENT_AMBULATORY_CARE_PROVIDER_SITE_OTHER): Payer: Medicare Other

## 2012-04-20 DIAGNOSIS — J309 Allergic rhinitis, unspecified: Secondary | ICD-10-CM

## 2012-04-23 ENCOUNTER — Encounter: Payer: Self-pay | Admitting: Internal Medicine

## 2012-04-23 ENCOUNTER — Ambulatory Visit (INDEPENDENT_AMBULATORY_CARE_PROVIDER_SITE_OTHER): Payer: Medicare Other | Admitting: Internal Medicine

## 2012-04-23 VITALS — BP 140/88 | HR 76 | Temp 98.0°F | Resp 16 | Ht 62.0 in | Wt 176.0 lb

## 2012-04-23 DIAGNOSIS — J449 Chronic obstructive pulmonary disease, unspecified: Secondary | ICD-10-CM

## 2012-04-23 DIAGNOSIS — I10 Essential (primary) hypertension: Secondary | ICD-10-CM

## 2012-04-23 DIAGNOSIS — M545 Low back pain, unspecified: Secondary | ICD-10-CM

## 2012-04-23 DIAGNOSIS — E785 Hyperlipidemia, unspecified: Secondary | ICD-10-CM | POA: Diagnosis not present

## 2012-04-23 DIAGNOSIS — M79604 Pain in right leg: Secondary | ICD-10-CM

## 2012-04-23 NOTE — Progress Notes (Signed)
Subjective:    Patient ID: [name] [name], female    DOB: [birth year], 78 y.o.   MRN: [id number]  HPI Back pain stable but not exercising COPD  With hx of smoking nodules where monitored by Pulmonary CT in six months Stable HTN No smoking     Review of Systems  Constitutional: Negative for activity change, appetite change and fatigue.  HENT: Negative for ear pain, congestion, neck pain, postnasal drip and sinus pressure.   Eyes: Negative for redness and visual disturbance.  Respiratory: Positive for shortness of breath. Negative for cough and wheezing.   Cardiovascular: Positive for leg swelling.  Gastrointestinal: Negative for abdominal pain and abdominal distention.  Genitourinary: Negative for dysuria, frequency and menstrual problem.  Musculoskeletal: Positive for back pain and gait problem. Negative for myalgias, joint swelling and arthralgias.  Skin: Negative for rash and wound.  Neurological: Positive for weakness. Negative for dizziness and headaches.  Hematological: Negative for adenopathy. Does not bruise/bleed easily.  Psychiatric/Behavioral: Negative for sleep disturbance and decreased concentration.   Past Medical History  Diagnosis Date  . GERD (gastroesophageal reflux disease)   . Hyperlipidemia   . History of colonic polyps   . Diverticulosis   . Shingles   . Eczema   . Allergic rhinoconjunctivitis   . Lung nodule 2011  . Environmental allergies     allergy shot- q friday in Dr. Roxy Cedar office. PFT's abnormal- recommended Spiriva to use preop & will d/c after surgery  . Arthritis     low back , stenosis  . Anxiety     pt. managed- uses deep breathing   . Stenosis of popliteal artery     blood clots in legs long ago    . COPD (chronic obstructive pulmonary disease)   . H/O hiatal hernia   . Hypertension     History   Social History  . Marital Status: Single    Spouse Name: N/A    Number of Children: N/A  . Years of Education: N/A    Occupational History  . Not on file.   Social History Main Topics  . Smoking status: Former Smoker -- 2.0 packs/day for 30 years    Types: Cigarettes    Quit date: 06/18/1993  . Smokeless tobacco: Not on file     Comment: QUIT IN 1995  . Alcohol Use: No  . Drug Use: No  . Sexually Active: Not on file   Other Topics Concern  . Not on file   Social History Narrative   ** Merged History Encounter **     Past Surgical History  Procedure Date  . Tubal ligation   . Cholecystectomy 1998  . Femoral-popliteal bypass graft        x2 surgeries 1990's & 2009  . Eye surgery     cataracts removed- bilateral /w IOL  . Tonsillectomy     as a teenager   . Tonsilectomy, adenoidectomy, bilateral myringotomy and tubes     no myringotomy tubes   . Lumbar fusion 07/06/2011    Family History  Problem Relation Age of Onset  . Arthritis Mother   . Heart disease Father   . Colon polyps Father   . Breast cancer Sister   . Lung cancer Sister   . Colon cancer Sister   . Lung cancer Brother   . Anesthesia problems Neg Hx   . Hypotension Neg Hx   . Malignant hyperthermia Neg Hx   . Pseudochol deficiency Neg Hx  Allergies  Allergen Reactions  . Latex     REACTION: rash  . Sulfa Antibiotics Other (See Comments)    Cold sweat light headed   . Sulfamethoxazole     REACTION: disorientation  . Tiotropium Bromide Other (See Comments)    Sore throat    Current Outpatient Prescriptions on File Prior to Visit  Medication Sig Dispense Refill  . aspirin 81 MG tablet Take 81 mg by mouth daily.      . calcium-vitamin D (OSCAL WITH D 500-200) 500-200 MG-UNIT per tablet Take 1 tablet by mouth daily.        . carbamazepine (CARBATROL) 100 MG 12 hr capsule Take 100 mg by mouth 2 (two) times daily. Takes one at night and may take a second in the morning for pain      . Cholecalciferol (VITAMIN D3) 1000 UNITS CAPS Take 1,000 Units by mouth daily.       . Fluticasone Furoate-Vilanterol (BREO  ELLIPTA) 100-25 MCG/INH AEPB Inhale 1 puff into the lungs daily.  14 each  0  . Melatonin 3 MG CAPS Take 1 capsule by mouth at bedtime as needed.      Marland Kitchen omeprazole (PRILOSEC) 20 MG capsule Take 20 mg by mouth every morning.       . simvastatin (ZOCOR) 40 MG tablet Take 1 tablet (40 mg total) by mouth at bedtime.  90 tablet  3  . valsartan-hydrochlorothiazide (DIOVAN-HCT) 160-25 MG per tablet TAKE ONE TABLET BY MOUTH DAILY  90 tablet  3    BP 140/88  Pulse 76  Temp 98 F (36.7 C)  Resp 16  Ht 5\' 2"  (1.575 m)  Wt 176 lb (79.833 kg)  BMI 32.19 kg/m2        Objective:   Physical Exam  Nursing note reviewed. Constitutional: She is oriented to person, place, and time. She appears well-developed and well-nourished. No distress.  HENT:  Head: Normocephalic and atraumatic.  Eyes: Conjunctivae normal and EOM are normal. Pupils are equal, round, and reactive to light.  Neck: Normal range of motion. Neck supple. No JVD present. No tracheal deviation present. No thyromegaly present.  Cardiovascular: Normal rate and regular rhythm.   Murmur heard. Pulmonary/Chest: Effort normal. She has wheezes. She exhibits no tenderness.  Abdominal: Soft. Bowel sounds are normal.  Musculoskeletal: Normal range of motion. She exhibits no edema and no tenderness.  Lymphadenopathy:    She has no cervical adenopathy.  Neurological: She is alert and oriented to person, place, and time. She has normal reflexes. No cranial nerve deficit.  Skin: Skin is warm and dry. She is not diaphoretic.  Psychiatric: She has a normal mood and affect. Her behavior is normal.          Assessment & Plan:  Weight gain  10 lbs discussed the roll of weight gain and the risks to pulmonary vascular and back Resume exercise Cut wheat/bread

## 2012-04-23 NOTE — Patient Instructions (Addendum)
Look up paleo diet  "practical paleo" is the book

## 2012-04-30 ENCOUNTER — Telehealth: Payer: Self-pay | Admitting: Internal Medicine

## 2012-04-30 ENCOUNTER — Ambulatory Visit (HOSPITAL_COMMUNITY)
Admission: RE | Admit: 2012-04-30 | Discharge: 2012-04-30 | Disposition: A | Discharge status: Home / Self Care | Payer: Medicare Other | Source: Ambulatory Visit | Attending: Internal Medicine | Admitting: Internal Medicine

## 2012-04-30 DIAGNOSIS — R1310 Dysphagia, unspecified: Secondary | ICD-10-CM

## 2012-04-30 DIAGNOSIS — R1312 Dysphagia, oropharyngeal phase: Secondary | ICD-10-CM | POA: Insufficient documentation

## 2012-04-30 DIAGNOSIS — K219 Gastro-esophageal reflux disease without esophagitis: Secondary | ICD-10-CM | POA: Insufficient documentation

## 2012-04-30 DIAGNOSIS — J449 Chronic obstructive pulmonary disease, unspecified: Secondary | ICD-10-CM | POA: Insufficient documentation

## 2012-04-30 DIAGNOSIS — J4489 Other specified chronic obstructive pulmonary disease: Secondary | ICD-10-CM | POA: Insufficient documentation

## 2012-04-30 DIAGNOSIS — I10 Essential (primary) hypertension: Secondary | ICD-10-CM | POA: Insufficient documentation

## 2012-04-30 MED ORDER — FLUTICASONE FUROATE-VILANTEROL 100-25 MCG/INH IN AEPB: 1.0000 | INHALATION_SPRAY | Freq: Every day | RESPIRATORY_TRACT | Status: DC

## 2012-04-30 NOTE — Assessment & Plan Note (Addendum)
Her dyspnea includes a component of deconditioning. Without finding other acute problems, I have suggested she get back to her exercise program and she agrees. She did respond to bronchodilator on her PFT. Plan trial sample Breo Ellipta  Her tendency to choke easily while eating suggests laryngo-pharyngeal penetration. Plan-speech therapy assisted modified barium swallow

## 2012-04-30 NOTE — Telephone Encounter (Signed)
Pt needed a refill on Breo. She is aware that this will be sent to [facility].

## 2012-04-30 NOTE — Assessment & Plan Note (Signed)
We discussed potential for indolent infection such as MAIC. Question if she is producing sputum for culture. We will watch for disease activity.

## 2012-04-30 NOTE — Assessment & Plan Note (Signed)
She feels allergy vaccine has helped. We can continue as discussed.

## 2012-04-30 NOTE — Assessment & Plan Note (Signed)
Most recent CT would be consistent with indolent infection such as MAIC. We discussed attempt to culture.

## 2012-04-30 NOTE — Procedures (Signed)
Objective Swallowing Evaluation: Modified Barium Swallowing Study  Patient Details  Name: [name] [name] MRN: [id number] Date of Birth: January 18, 1925  Today's Date: 04/30/2012 Time: 1110-1140 SLP Time Calculation (min): 30 min  Past Medical History:  Past Medical History  Diagnosis Date  . GERD (gastroesophageal reflux disease)   . Hyperlipidemia   . History of colonic polyps   . Diverticulosis   . Shingles   . Eczema   . Allergic rhinoconjunctivitis   . Lung nodule 2011  . Environmental allergies     allergy shot- q friday in Dr. Roxy Cedar office. PFT's abnormal- recommended Spiriva to use preop & will d/c after surgery  . Arthritis     low back , stenosis  . Anxiety     pt. managed- uses deep breathing   . Stenosis of popliteal artery     blood clots in legs long ago    . COPD (chronic obstructive pulmonary disease)   . H/O hiatal hernia   . Hypertension    Past Surgical History:  Past Surgical History  Procedure Date  . Tubal ligation   . Cholecystectomy 1998  . Femoral-popliteal bypass graft        x2 surgeries 1990's & 2009  . Eye surgery     cataracts removed- bilateral /w IOL  . Tonsillectomy     as a teenager   . Tonsilectomy, adenoidectomy, bilateral myringotomy and tubes     no myringotomy tubes   . Lumbar fusion 07/06/2011   HPI:  [name] [name] is an 79 year old female arriving for an outpatient MBS due to complaints of coughin/choking with solid foods and pills. Pt reports history of GERD, treated with PPI. She denies history of CVA, pna. Pt specifically reports choking frequently while eating out with friends, episode of pill being stuck, expectorated hours later with excessive mucous.      Assessment / Plan / Recommendation Clinical Impression  Clinical impression: Pt presents with overall normal oropharyngeal function with no evidence of aspiration. Esophageal sweep revealed appearance of very mild stasis with solid POs, cleared with liquid wash. No  radiologist present to confirm. Pt did demonstrate one instance of delay to the valleculae/pyriform sinuses with puree when eating more quickly, with other trials pt very deliberate and conscious of mastication of transit. Suspect pt may experience reflux cough or intermittent penetration of solids due to a mild delay in initiation not outside of normal limits for pts age. This may explain coughing episodes. Strategies suggested to pt to improve esophageal transit, increase attention to PO consumption. Aspiration risk is mild. No SLP f/u needed. Pt verbalized understanding.     Treatment Recommendation       Diet Recommendation Regular;Thin liquid   Liquid Administration via: Cup;Straw Medication Administration: Whole meds with liquid Supervision: Patient able to self feed Compensations: Slow rate;Small sips/bites;Follow solids with liquid Postural Changes and/or Swallow Maneuvers: Seated upright 90 degrees    Other  Recommendations Oral Care Recommendations: Patient independent with oral care   Follow Up Recommendations  None    Frequency and Duration        Pertinent Vitals/Pain NA    SLP Swallow Goals     General HPI: [name] [name] is an 79 year old female arriving for an outpatient MBS due to complaints of coughin/choking with solid foods and pills. Pt reports history of GERD, treated with PPI. She denies history of CVA, pna. Pt specifically reports choking frequently while eating out with friends, episode of pill being stuck,  expectorated hours later with excessive mucous.  Type of Study: Modified Barium Swallowing Study Reason for Referral: Objectively evaluate swallowing function Diet Prior to this Study: Regular;Thin liquids Temperature Spikes Noted: N/A Respiratory Status: Room air History of Recent Intubation: No Behavior/Cognition: Alert;Cooperative;Pleasant mood Oral Cavity - Dentition: Dentures, top;Dentures, bottom Oral Motor / Sensory Function: Within functional  limits Self-Feeding Abilities: Able to feed self Patient Positioning: Upright in chair Baseline Vocal Quality: Clear Volitional Cough: Strong Volitional Swallow: Able to elicit Anatomy: Within functional limits Pharyngeal Secretions: Not observed secondary MBS    Reason for Referral Objectively evaluate swallowing function   Oral Phase Oral Preparation/Oral Phase Oral Phase: WFL   Pharyngeal Phase Pharyngeal Phase Pharyngeal Phase: Impaired Pharyngeal - Thin Pharyngeal - Thin Cup: Within functional limits Pharyngeal - Thin Straw: Within functional limits Pharyngeal - Solids Pharyngeal - Puree: Delayed swallow initiation;Premature spillage to valleculae Pharyngeal - Regular: Within functional limits Pharyngeal - Pill: Within functional limits  Cervical Esophageal Phase    GO         Functional Assessment Tool Used: clinical judgement Functional Limitations: Swallowing Swallow Current Status (Z6109): At least 1 percent but less than 20 percent impaired, limited or restricted Swallow Goal Status [phone]): At least 1 percent but less than 20 percent impaired, limited or restricted Swallow Discharge Status [phone]): At least 1 percent but less than 20 percent impaired, limited or restricted   [facility], [place] CCC-SLP [phone]  [name] [name] 04/30/2012, 1:04 PM

## 2012-05-03 NOTE — Progress Notes (Signed)
Quick Note:  Spoke with pt and notified of results per Dr. Young. Pt verbalized understanding and denied any questions.  ______ 

## 2012-05-04 ENCOUNTER — Telehealth: Payer: Self-pay | Admitting: Internal Medicine

## 2012-05-04 ENCOUNTER — Ambulatory Visit (INDEPENDENT_AMBULATORY_CARE_PROVIDER_SITE_OTHER): Payer: Medicare Other

## 2012-05-04 DIAGNOSIS — J309 Allergic rhinitis, unspecified: Secondary | ICD-10-CM | POA: Diagnosis not present

## 2012-05-04 MED ORDER — FLUTICASONE-SALMETEROL 100-50 MCG/DOSE IN AEPB: 1.0000 | INHALATION_SPRAY | Freq: Two times a day (BID) | RESPIRATORY_TRACT | Status: DC

## 2012-05-04 NOTE — Telephone Encounter (Signed)
[id number] x1 

## 2012-05-04 NOTE — Telephone Encounter (Signed)
Please advise on alternative for breo, or let us know if we need to do a PA. thanks

## 2012-05-04 NOTE — Telephone Encounter (Signed)
Instead of Breo- try sample/ script Advair 100,  1 puff and rinse mouth, twice daily

## 2012-05-04 NOTE — Telephone Encounter (Signed)
Patient returning call.

## 2012-05-04 NOTE — Telephone Encounter (Signed)
Pt called back to check on status.  [name] [name]

## 2012-05-04 NOTE — Telephone Encounter (Signed)
Spoke with pt and notified of recs per CDY She verbalized understanding Sample up front and rx sent Pt aware to have nurse show her technique when pick up sample

## 2012-05-10 ENCOUNTER — Encounter: Payer: Self-pay | Admitting: Internal Medicine

## 2012-05-11 ENCOUNTER — Ambulatory Visit (INDEPENDENT_AMBULATORY_CARE_PROVIDER_SITE_OTHER): Payer: Medicare Other

## 2012-05-11 DIAGNOSIS — J309 Allergic rhinitis, unspecified: Secondary | ICD-10-CM | POA: Diagnosis not present

## 2012-05-18 ENCOUNTER — Ambulatory Visit (INDEPENDENT_AMBULATORY_CARE_PROVIDER_SITE_OTHER): Payer: Medicare Other

## 2012-05-18 ENCOUNTER — Other Ambulatory Visit: Payer: Self-pay | Admitting: *Deleted

## 2012-05-18 DIAGNOSIS — I739 Peripheral vascular disease, unspecified: Secondary | ICD-10-CM

## 2012-05-18 DIAGNOSIS — J309 Allergic rhinitis, unspecified: Secondary | ICD-10-CM

## 2012-05-18 DIAGNOSIS — Z48812 Encounter for surgical aftercare following surgery on the circulatory system: Secondary | ICD-10-CM

## 2012-05-24 ENCOUNTER — Ambulatory Visit (INDEPENDENT_AMBULATORY_CARE_PROVIDER_SITE_OTHER): Payer: Medicare Other

## 2012-05-24 DIAGNOSIS — J309 Allergic rhinitis, unspecified: Secondary | ICD-10-CM | POA: Diagnosis not present

## 2012-05-25 ENCOUNTER — Ambulatory Visit (INDEPENDENT_AMBULATORY_CARE_PROVIDER_SITE_OTHER): Payer: Medicare Other

## 2012-05-25 DIAGNOSIS — J309 Allergic rhinitis, unspecified: Secondary | ICD-10-CM

## 2012-05-30 ENCOUNTER — Encounter: Payer: Self-pay | Admitting: Neurosurgery

## 2012-05-31 ENCOUNTER — Ambulatory Visit: Payer: Medicare Other | Admitting: Neurosurgery

## 2012-06-01 ENCOUNTER — Ambulatory Visit (INDEPENDENT_AMBULATORY_CARE_PROVIDER_SITE_OTHER): Payer: Medicare Other

## 2012-06-01 DIAGNOSIS — J309 Allergic rhinitis, unspecified: Secondary | ICD-10-CM

## 2012-06-08 ENCOUNTER — Ambulatory Visit: Payer: Self-pay

## 2012-06-15 ENCOUNTER — Ambulatory Visit (INDEPENDENT_AMBULATORY_CARE_PROVIDER_SITE_OTHER): Payer: Medicare Other

## 2012-06-15 DIAGNOSIS — J309 Allergic rhinitis, unspecified: Secondary | ICD-10-CM | POA: Diagnosis not present

## 2012-06-20 ENCOUNTER — Encounter: Payer: Self-pay | Admitting: Neurosurgery

## 2012-06-21 ENCOUNTER — Encounter (INDEPENDENT_AMBULATORY_CARE_PROVIDER_SITE_OTHER): Payer: Medicare Other | Admitting: *Deleted

## 2012-06-21 ENCOUNTER — Other Ambulatory Visit: Payer: Self-pay | Admitting: *Deleted

## 2012-06-21 ENCOUNTER — Encounter: Payer: Self-pay | Admitting: Neurosurgery

## 2012-06-21 ENCOUNTER — Ambulatory Visit (INDEPENDENT_AMBULATORY_CARE_PROVIDER_SITE_OTHER): Payer: Medicare Other | Admitting: Neurosurgery

## 2012-06-21 VITALS — BP 130/68 | HR 67 | Resp 16 | Ht 62.0 in | Wt 170.0 lb

## 2012-06-21 DIAGNOSIS — Z48812 Encounter for surgical aftercare following surgery on the circulatory system: Secondary | ICD-10-CM

## 2012-06-21 DIAGNOSIS — I739 Peripheral vascular disease, unspecified: Secondary | ICD-10-CM

## 2012-06-21 NOTE — Progress Notes (Signed)
[facility] Office Note  CC: PAD surveillance Referring Physician: Fields  History of Present Illness: 79 year old female patient of Dr. Darrick Penna status post bilateral femoropopliteal bypass with Dr. Orson Slick, she's also had thrombolysis angioplasty of the right femoropopliteal bypass in 2010 and thrombectomy angioplasty right interpositional graft from above the knee to below the knee popliteal artery in 2010. The patient denies claudication or rest pain, she has had recent back surgery with Dr. Lovell Sheehan and she states that relieved about 80% of her pain and she's been much more active since that time.  Past Medical History  Diagnosis Date  . GERD (gastroesophageal reflux disease)   . Hyperlipidemia   . History of colonic polyps   . Diverticulosis   . Shingles   . Eczema   . Allergic rhinoconjunctivitis   . Lung nodule 2011  . Environmental allergies     allergy shot- q friday in Dr. Roxy Cedar office. PFT's abnormal- recommended Spiriva to use preop & will d/c after surgery  . Arthritis     low back , stenosis  . Anxiety     pt. managed- uses deep breathing   . Stenosis of popliteal artery     blood clots in legs long ago    . COPD (chronic obstructive pulmonary disease)   . H/O hiatal hernia   . Hypertension     ROS: [x]  Positive   [ ]  Denies    General: [ ]  Weight loss, [ ]  Fever, [ ]  chills Neurologic: [ ]  Dizziness, [ ]  Blackouts, [ ]  Seizure [ ]  Stroke, [ ]  "Mini stroke", [ ]  Slurred speech, [ ]  Temporary blindness; [ ]  weakness in arms or legs, [ ]  Hoarseness Cardiac: [ ]  Chest pain/pressure, [ ]  Shortness of breath at rest [ ]  Shortness of breath with exertion, [ ]  Atrial fibrillation or irregular heartbeat Vascular: [ ]  Pain in legs with walking, [ ]  Pain in legs at rest, [ ]  Pain in legs at night,  [ ]  Non-healing ulcer, [ ]  Blood clot in vein/DVT,   Pulmonary: [ ]  Home oxygen, [ ]  Productive cough, [ ]  Coughing up blood, [ ]  Asthma,  [  ] Wheezing Musculoskeletal:  [ ]  Arthritis, [ ]  Low back pain, [ ]  Joint pain Hematologic: [ ]  Easy Bruising, [ ]  Anemia; [ ]  Hepatitis Gastrointestinal: [ ]  Blood in stool, [ ]  Gastroesophageal Reflux/heartburn, [ ]  Trouble swallowing Urinary: [ ]  chronic Kidney disease, [ ]  on HD - [ ]  MWF or [ ]  TTHS, [ ]  Burning with urination, [ ]  Difficulty urinating Skin: [ ]  Rashes, [ ]  Wounds Psychological: [ ]  Anxiety, [ ]  Depression   Social History History  Substance Use Topics  . Smoking status: Former Smoker -- 2.00 packs/day for 30 years    Types: Cigarettes    Quit date: 06/18/1993  . Smokeless tobacco: Never Used     Comment: QUIT IN 1995  . Alcohol Use: No    Family History Family History  Problem Relation Age of Onset  . Arthritis Mother   . Heart disease Father   . Colon polyps Father   . Breast cancer Sister   . Lung cancer Sister   . Colon cancer Sister   . Lung cancer Brother   . Anesthesia problems Neg Hx   . Hypotension Neg Hx   . Malignant hyperthermia Neg Hx   . Pseudochol deficiency Neg Hx     Allergies  Allergen Reactions  . Latex  REACTION: rash  . Sulfa Antibiotics Other (See Comments)    Cold sweat light headed   . Sulfamethoxazole     REACTION: disorientation  . Tiotropium Bromide Other (See Comments)    Sore throat    Current Outpatient Prescriptions  Medication Sig Dispense Refill  . aspirin 81 MG tablet Take 81 mg by mouth daily.      . calcium-vitamin D (OSCAL WITH D 500-200) 500-200 MG-UNIT per tablet Take 1 tablet by mouth daily.        . carbamazepine (CARBATROL) 100 MG 12 hr capsule Take 100 mg by mouth 2 (two) times daily. Takes one at night and may take a second in the morning for pain      . Cholecalciferol (VITAMIN D3) 1000 UNITS CAPS Take 1,000 Units by mouth daily.       . Fluticasone-Salmeterol (ADVAIR DISKUS) 100-50 MCG/DOSE AEPB Inhale 1 puff into the lungs 2 (two) times daily.  14 each  0  . omeprazole (PRILOSEC) 20 MG  capsule Take 20 mg by mouth every morning.       . simvastatin (ZOCOR) 40 MG tablet Take 1 tablet (40 mg total) by mouth at bedtime.  90 tablet  3  . valsartan-hydrochlorothiazide (DIOVAN-HCT) 160-25 MG per tablet TAKE ONE TABLET BY MOUTH DAILY  90 tablet  3  . Fluticasone Furoate-Vilanterol (BREO ELLIPTA) 100-25 MCG/INH AEPB Inhale 1 puff into the lungs daily.  28 each  2  . Fluticasone-Salmeterol (ADVAIR) 100-50 MCG/DOSE AEPB Inhale 1 puff into the lungs 2 (two) times daily.  60 each  5  . Melatonin 3 MG CAPS Take 1 capsule by mouth at bedtime as needed.       No current facility-administered medications for this visit.    Physical Examination  Filed Vitals:   06/21/12 1136  BP: 130/68  Pulse: 67  Resp: 16    Body mass index is 31.09 kg/(m^2).  General:  WDWN in NAD Gait: Normal HEENT: WNL Eyes: Pupils equal Pulmonary: normal non-labored breathing , without Rales, rhonchi,  wheezing Cardiac: RRR, without  Murmurs, rubs or gallops; No carotid bruits Abdomen: soft, NT, no masses Skin: no rashes, ulcers noted Vascular Exam/Pulses: Palpable femoral and lower extremity pulses bilateral  Extremities without ischemic changes, no Gangrene , no cellulitis; no open wounds;  Musculoskeletal: no muscle wasting or atrophy  Neurologic: A&O X 3; Appropriate Affect ; SENSATION: normal; MOTOR FUNCTION:  moving all extremities equally. Speech is fluent/normal  Non-Invasive Vascular Imaging: ABIs today are 0.95 and biphasic on the right, 1.02 and biphasic on the left with a patent lower extremity bypass graft. There were lots these noted 339 cm a second, 263 cm a second, 222 cm a second at the right proximal anastomosis and left common femoral artery and left proximal anastomosis levels respectively on the previous exam in February 2013  ASSESSMENT/PLAN: Asymptomatic patient will followup in one year with repeat ABIs and graft duplex. The patient's questions were encouraged and answered, she is  in agreement with this plan.  Lauree Chandler ANP  Clinic M.D.: Fields

## 2012-06-22 ENCOUNTER — Ambulatory Visit (INDEPENDENT_AMBULATORY_CARE_PROVIDER_SITE_OTHER): Payer: Medicare Other

## 2012-06-22 DIAGNOSIS — J309 Allergic rhinitis, unspecified: Secondary | ICD-10-CM | POA: Diagnosis not present

## 2012-06-29 ENCOUNTER — Ambulatory Visit: Payer: Medicare Other

## 2012-07-06 ENCOUNTER — Ambulatory Visit (INDEPENDENT_AMBULATORY_CARE_PROVIDER_SITE_OTHER): Payer: Medicare Other

## 2012-07-06 DIAGNOSIS — J309 Allergic rhinitis, unspecified: Secondary | ICD-10-CM | POA: Diagnosis not present

## 2012-07-13 ENCOUNTER — Ambulatory Visit (INDEPENDENT_AMBULATORY_CARE_PROVIDER_SITE_OTHER): Payer: Medicare Other

## 2012-07-13 DIAGNOSIS — J309 Allergic rhinitis, unspecified: Secondary | ICD-10-CM | POA: Diagnosis not present

## 2012-07-20 ENCOUNTER — Ambulatory Visit (INDEPENDENT_AMBULATORY_CARE_PROVIDER_SITE_OTHER): Payer: Medicare Other

## 2012-07-20 DIAGNOSIS — J309 Allergic rhinitis, unspecified: Secondary | ICD-10-CM

## 2012-07-27 ENCOUNTER — Ambulatory Visit: Payer: Medicare Other

## 2012-08-03 ENCOUNTER — Ambulatory Visit (INDEPENDENT_AMBULATORY_CARE_PROVIDER_SITE_OTHER): Payer: Medicare Other

## 2012-08-03 DIAGNOSIS — J309 Allergic rhinitis, unspecified: Secondary | ICD-10-CM

## 2012-08-03 DIAGNOSIS — M4310 Spondylolisthesis, site unspecified: Secondary | ICD-10-CM | POA: Diagnosis not present

## 2012-08-09 ENCOUNTER — Ambulatory Visit: Payer: Self-pay

## 2012-08-17 ENCOUNTER — Ambulatory Visit (INDEPENDENT_AMBULATORY_CARE_PROVIDER_SITE_OTHER): Payer: Medicare Other

## 2012-08-17 DIAGNOSIS — J309 Allergic rhinitis, unspecified: Secondary | ICD-10-CM | POA: Diagnosis not present

## 2012-08-24 ENCOUNTER — Ambulatory Visit (INDEPENDENT_AMBULATORY_CARE_PROVIDER_SITE_OTHER): Payer: Medicare Other

## 2012-08-24 DIAGNOSIS — J309 Allergic rhinitis, unspecified: Secondary | ICD-10-CM

## 2012-08-27 ENCOUNTER — Ambulatory Visit (INDEPENDENT_AMBULATORY_CARE_PROVIDER_SITE_OTHER): Payer: Medicare Other | Admitting: Internal Medicine

## 2012-08-27 ENCOUNTER — Encounter: Payer: Self-pay | Admitting: Internal Medicine

## 2012-08-27 VITALS — BP 140/80 | HR 72 | Temp 98.6°F | Resp 16 | Ht 62.0 in | Wt 171.0 lb

## 2012-08-27 DIAGNOSIS — J449 Chronic obstructive pulmonary disease, unspecified: Secondary | ICD-10-CM

## 2012-08-27 DIAGNOSIS — L570 Actinic keratosis: Secondary | ICD-10-CM

## 2012-08-27 DIAGNOSIS — J4489 Other specified chronic obstructive pulmonary disease: Secondary | ICD-10-CM

## 2012-08-27 DIAGNOSIS — E785 Hyperlipidemia, unspecified: Secondary | ICD-10-CM

## 2012-08-27 DIAGNOSIS — I10 Essential (primary) hypertension: Secondary | ICD-10-CM | POA: Diagnosis not present

## 2012-08-27 NOTE — Progress Notes (Signed)
Subjective:    Patient ID: [name] [name], female    DOB: [birth year], 79 y.o.   MRN: [id number]  HPI Back stable follow up with Neurosurgeon, stable back pain ( controlled) Exercise alleve works Has non healing spot on face   Review of Systems  Constitutional: Negative for activity change, appetite change and fatigue.  HENT: Negative for ear pain, congestion, neck pain, postnasal drip and sinus pressure.   Eyes: Negative for redness and visual disturbance.  Respiratory: Negative for cough, shortness of breath and wheezing.   Gastrointestinal: Negative for abdominal pain and abdominal distention.  Genitourinary: Negative for dysuria, frequency and menstrual problem.  Musculoskeletal: Negative for myalgias, joint swelling and arthralgias.  Skin: Negative for rash and wound.  Neurological: Negative for dizziness, weakness and headaches.  Hematological: Negative for adenopathy. Does not bruise/bleed easily.  Psychiatric/Behavioral: Negative for sleep disturbance and decreased concentration.   Past Medical History  Diagnosis Date  . GERD (gastroesophageal reflux disease)   . Hyperlipidemia   . History of colonic polyps   . Diverticulosis   . Shingles   . Eczema   . Allergic rhinoconjunctivitis   . Lung nodule 2011  . Environmental allergies     allergy shot- q friday in Dr. Roxy Cedar office. PFT's abnormal- recommended Spiriva to use preop & will d/c after surgery  . Arthritis     low back , stenosis  . Anxiety     pt. managed- uses deep breathing   . Stenosis of popliteal artery     blood clots in legs long ago    . COPD (chronic obstructive pulmonary disease)   . H/O hiatal hernia   . Hypertension     History   Social History  . Marital Status: Single    Spouse Name: N/A    Number of Children: N/A  . Years of Education: N/A   Occupational History  . Not on file.   Social History Main Topics  . Smoking status: Former Smoker -- 2.00 packs/day for 30 years   Types: Cigarettes    Quit date: 06/18/1993  . Smokeless tobacco: Never Used     Comment: QUIT IN 1995  . Alcohol Use: No  . Drug Use: No  . Sexually Active: Not on file   Other Topics Concern  . Not on file   Social History Narrative   ** Merged History Encounter **        Past Surgical History  Procedure Laterality Date  . Tubal ligation    . Cholecystectomy  1998  . Femoral-popliteal bypass graft         x2 surgeries 1990's & 2009  . Eye surgery      cataracts removed- bilateral /w IOL  . Tonsillectomy      as a teenager   . Tonsilectomy, adenoidectomy, bilateral myringotomy and tubes      no myringotomy tubes   . Lumbar fusion  07/06/2011  . Spine surgery  March 2013    Family History  Problem Relation Age of Onset  . Arthritis Mother   . Heart disease Father   . Colon polyps Father   . Breast cancer Sister   . Lung cancer Sister   . Colon cancer Sister   . Lung cancer Brother   . Anesthesia problems Neg Hx   . Hypotension Neg Hx   . Malignant hyperthermia Neg Hx   . Pseudochol deficiency Neg Hx     Allergies  Allergen Reactions  . Latex  REACTION: rash  . Sulfa Antibiotics Other (See Comments)    Cold sweat light headed   . Sulfamethoxazole     REACTION: disorientation  . Tiotropium Bromide Other (See Comments)    Sore throat    Current Outpatient Prescriptions on File Prior to Visit  Medication Sig Dispense Refill  . aspirin 81 MG tablet Take 81 mg by mouth daily.      . calcium-vitamin D (OSCAL WITH D 500-200) 500-200 MG-UNIT per tablet Take 1 tablet by mouth daily.        . carbamazepine (CARBATROL) 100 MG 12 hr capsule Take 100 mg by mouth 2 (two) times daily. Takes one at night and may take a second in the morning for pain      . Cholecalciferol (VITAMIN D3) 1000 UNITS CAPS Take 1,000 Units by mouth daily.       . Fluticasone-Salmeterol (ADVAIR) 100-50 MCG/DOSE AEPB Inhale 1 puff into the lungs 2 (two) times daily.  60 each  5  . omeprazole  (PRILOSEC) 20 MG capsule Take 20 mg by mouth every morning.       . simvastatin (ZOCOR) 40 MG tablet Take 1 tablet (40 mg total) by mouth at bedtime.  90 tablet  3  . valsartan-hydrochlorothiazide (DIOVAN-HCT) 160-25 MG per tablet TAKE ONE TABLET BY MOUTH DAILY  90 tablet  3   No current facility-administered medications on file prior to visit.    BP 140/80  Pulse 72  Temp(Src) 98.6 F (37 C)  Resp 16  Ht 5\' 2"  (1.575 m)  Wt 171 lb (77.565 kg)  BMI 31.27 kg/m2       Objective:   Physical Exam  Nursing note and vitals reviewed. Constitutional: She is oriented to person, place, and time. She appears well-developed and well-nourished. No distress.  HENT:  Head: Normocephalic and atraumatic.  Eyes: Conjunctivae and EOM are normal. Pupils are equal, round, and reactive to light.  Neck: Normal range of motion. Neck supple. No JVD present. No tracheal deviation present. No thyromegaly present.  Cardiovascular: Normal rate and regular rhythm.   Murmur heard. Pulmonary/Chest: Effort normal and breath sounds normal. She has no wheezes. She exhibits no tenderness.  Abdominal: Soft. Bowel sounds are normal.  Musculoskeletal: Normal range of motion. She exhibits no edema and no tenderness.  Lymphadenopathy:    She has no cervical adenopathy.  Neurological: She is alert and oriented to person, place, and time. She has normal reflexes. No cranial nerve deficit.  Skin: Skin is warm and dry. She is not diaphoretic.  Psychiatric: She has a normal mood and affect. Her behavior is normal.          Assessment & Plan:  Hypertension stable Needs to go to exercise class.  Informed consent was obtained in the lesion was treated for 60 seconds of liquid nitrogen application the patient tolerated the procedure well as procedural care was discussed with the patient and instructions should the lesion reappears contact our office immediately On face  Chronic back pain stable after spinal stenosis  surgery [facility]/ [facility]? On face

## 2012-08-31 ENCOUNTER — Ambulatory Visit (INDEPENDENT_AMBULATORY_CARE_PROVIDER_SITE_OTHER): Payer: Medicare Other

## 2012-08-31 DIAGNOSIS — J309 Allergic rhinitis, unspecified: Secondary | ICD-10-CM

## 2012-09-07 ENCOUNTER — Ambulatory Visit (INDEPENDENT_AMBULATORY_CARE_PROVIDER_SITE_OTHER): Payer: Medicare Other

## 2012-09-07 DIAGNOSIS — J309 Allergic rhinitis, unspecified: Secondary | ICD-10-CM | POA: Diagnosis not present

## 2012-09-14 ENCOUNTER — Ambulatory Visit: Payer: Self-pay

## 2012-09-14 ENCOUNTER — Ambulatory Visit (INDEPENDENT_AMBULATORY_CARE_PROVIDER_SITE_OTHER): Payer: Medicare Other

## 2012-09-14 DIAGNOSIS — J309 Allergic rhinitis, unspecified: Secondary | ICD-10-CM | POA: Diagnosis not present

## 2012-09-21 ENCOUNTER — Ambulatory Visit (INDEPENDENT_AMBULATORY_CARE_PROVIDER_SITE_OTHER): Payer: Medicare Other

## 2012-09-21 DIAGNOSIS — J309 Allergic rhinitis, unspecified: Secondary | ICD-10-CM

## 2012-09-25 ENCOUNTER — Telehealth: Payer: Self-pay | Admitting: Internal Medicine

## 2012-09-25 NOTE — Telephone Encounter (Signed)
Talked with pt and lesions have gotten smaller- dr Lovell Sheehan will check at [facility] in september

## 2012-09-25 NOTE — Telephone Encounter (Signed)
Caller: Riham/Patient; Phone: [phone]; Reason for Call: 08/24/12 Dr.  Lovell Sheehan froze a possible pre-cancerous place, and it is still there, does she need a referral, or what is the next step ?

## 2012-09-28 ENCOUNTER — Ambulatory Visit (INDEPENDENT_AMBULATORY_CARE_PROVIDER_SITE_OTHER): Payer: Medicare Other

## 2012-09-28 DIAGNOSIS — J309 Allergic rhinitis, unspecified: Secondary | ICD-10-CM | POA: Diagnosis not present

## 2012-10-01 ENCOUNTER — Ambulatory Visit (INDEPENDENT_AMBULATORY_CARE_PROVIDER_SITE_OTHER)
Admission: RE | Admit: 2012-10-01 | Discharge: 2012-10-01 | Disposition: A | Discharge status: Home / Self Care | Payer: Medicare Other | Source: Ambulatory Visit | Attending: Internal Medicine | Admitting: Internal Medicine

## 2012-10-01 DIAGNOSIS — J984 Other disorders of lung: Secondary | ICD-10-CM | POA: Diagnosis not present

## 2012-10-01 DIAGNOSIS — J438 Other emphysema: Secondary | ICD-10-CM | POA: Diagnosis not present

## 2012-10-02 ENCOUNTER — Other Ambulatory Visit: Payer: Self-pay | Admitting: Internal Medicine

## 2012-10-04 ENCOUNTER — Telehealth: Payer: Self-pay | Admitting: Internal Medicine

## 2012-10-04 NOTE — Telephone Encounter (Signed)
Notes Recorded by Waymon Budge, MD on 10/01/2012 at 8:48 PM Nodules appear stable/ benign.   I spoke with patient about results and she verbalized understanding and had no questions

## 2012-10-04 NOTE — Progress Notes (Signed)
Quick Note:  LMOM TCB x1. ______ 

## 2012-10-05 ENCOUNTER — Ambulatory Visit (INDEPENDENT_AMBULATORY_CARE_PROVIDER_SITE_OTHER): Payer: Medicare Other

## 2012-10-05 DIAGNOSIS — J309 Allergic rhinitis, unspecified: Secondary | ICD-10-CM

## 2012-10-10 ENCOUNTER — Ambulatory Visit (INDEPENDENT_AMBULATORY_CARE_PROVIDER_SITE_OTHER): Payer: Medicare Other

## 2012-10-10 DIAGNOSIS — J309 Allergic rhinitis, unspecified: Secondary | ICD-10-CM | POA: Diagnosis not present

## 2012-10-12 ENCOUNTER — Ambulatory Visit (INDEPENDENT_AMBULATORY_CARE_PROVIDER_SITE_OTHER): Payer: Medicare Other

## 2012-10-12 DIAGNOSIS — J309 Allergic rhinitis, unspecified: Secondary | ICD-10-CM | POA: Diagnosis not present

## 2012-10-18 ENCOUNTER — Ambulatory Visit (INDEPENDENT_AMBULATORY_CARE_PROVIDER_SITE_OTHER): Payer: Medicare Other | Admitting: Internal Medicine

## 2012-10-18 ENCOUNTER — Ambulatory Visit (INDEPENDENT_AMBULATORY_CARE_PROVIDER_SITE_OTHER): Payer: Medicare Other

## 2012-10-18 ENCOUNTER — Encounter: Payer: Self-pay | Admitting: Internal Medicine

## 2012-10-18 VITALS — BP 120/76 | HR 63 | Ht 60.0 in | Wt 179.4 lb

## 2012-10-18 DIAGNOSIS — J984 Other disorders of lung: Secondary | ICD-10-CM | POA: Diagnosis not present

## 2012-10-18 DIAGNOSIS — J309 Allergic rhinitis, unspecified: Secondary | ICD-10-CM

## 2012-10-18 DIAGNOSIS — J301 Allergic rhinitis due to pollen: Secondary | ICD-10-CM

## 2012-10-18 NOTE — Patient Instructions (Addendum)
We can continue allergy vaccine at 1:10 GH  Consider trying Claritin/ loratadine as an otc antihistamine for watery sniffing, when needed  Please call as needed

## 2012-10-18 NOTE — Progress Notes (Signed)
Patient ID: [name] [name], female    DOB: September 27, 1924, 79 y.o.   MRN: [id number]  HPI 08/02/10-85 yoF former smoker, followed here for allergic rhinitis on allergy vaccine, and COPD. She is bothered on chronic basis by her watery, burning eyes. She just saw Dr Katy Fitch, with all his drops tried. She is worse in past 1-2 months with Spring pollen season. She is resigned to staying this way. Seeing Dr Allyson Sabal for significant eczema of fingertips.  She remains satisfied that the allergy vaccine is helpful and worth continuing.  01/31/11-  86 yoF former smoker, followed here for allergic rhinitis on allergy vaccine, and COPD, lung nodules. She is bothered on chronic basis by her watery, burning eyes. Acute visit-ill for past 2 days. Complains of pressure in her ears, weakness and shakiness, headache, nausea. Eyes always water when she goes outdoors. She denies fever, swollen glands, sore throat, discolored sputum, chest tightness or wheeze. She has been on tramadol for several weeks and asks if that might be what started causing her to feel badly just a few days ago. CT chest 12/07/10-  IMPRESSION:  1. Compared with previous exam the previously referenced pulmonary  nodules in the right middle lobe have not changed in size.  Recommend continued follow-up to two years from initial study  01/15/2010.  Original Report Authenticated By: [name], M.D.   08/15/11- 75 yoF former smoker, followed here for allergic rhinitis on allergy vaccine, and COPD, lung nodules. She is bothered on chronic basis by her watery, burning eyes. Off of allergy vaccine for 6 weeks because of back surgery. Wants to resume and we discussed risk and safety considerations and alternatives. Allegra helps. Previously identified lung nodules, back to 2011. She had chest x-ray in hospital 06/27/2011 with stable nodularity right middle lobe. That completes 2 years of surveillance.  01/12/12- 14 yoF former smoker, followed here for  allergic rhinitis on allergy vaccine, and COPD, lung nodules. 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 Since last here had lumbar spine surgery, doing well. Evaluated at emergency room for vasovagal syncope and had normal stress test by her report, this week. Continues allergy vaccine 1:10 GH. Discussed  supplemental antihistamines. PPD skin test was negative this year. CT-01/04/12-images reviewed with her. She IMPRESSION:  1. While the two right middle lobe nodules noted on prior  examinations are unchanged, there is a new cluster of  peribronchovascular micronodules in the adjacent lateral segment of  the right middle lobe. Overall, this spectrum of findings strongly  favors a benign process (likely infectious disease), possibly  mycobacterial infection. These new nodules are highly unlikely to  be neoplastic, and the old nodules now demonstrate 2 years of  stability and can be considered radiographically benign. No other  suspicious appearing pulmonary nodules or masses are otherwise  identified.  2. Atherosclerosis, including left main and three-vessel coronary  artery disease.  3. Additional incidental findings, as above.  Original Report Authenticated By: [name], M.D.   04/19/12- 14 yoF former smoker, followed here for allergic rhinitis on allergy vaccine, and COPD, lung nodules. FOLLOWS FOR: Pt c/o increased SOB with exertion. Denies cough, wheezing, chest tightness.  She chokes easily on her food, which she finds "aggravating". Otherwise does not usually cough reports no phlegm, fever or sweat. No heartburn or food hanging up. Maybe a little more shortness of breath on exertion over the last  month or 2, climbing stairs. She had a normal stress echocardiogram documented below. She recognized improved stamina when she was going regularly to [employer], but she drifted away from  that over the holidays. Notes Recorded by Wendall Stade, MD on 01/10/2012: Normal stress echo CT 04/11/12- IMPRESSION:  1. Findings, as above, remain consistent with a chronic indolent  atypical infectious process, likely a mycobacterial infection. The  cluster of peribronchovascular micronodules in the lateral segment  of the right middle lobe that was new on the prior examination is  persistent, however, the largest nodule within this cluster has  resolved. No findings suspicious for malignancy are seen on  today's examination.  2. Atherosclerosis, including left main and three-vessel coronary  artery disease.  3. Dilatation of the pulmonic trunk (3.8 cm in diameter), which  may suggest pulmonary arterial hypertension.  4. Additional incidental findings, as above.  Original Report Authenticated By: Trudie Reed, M.D.  PFT: 05/24/2011 moderate obstructive airways disease with response to bronchodilator, air trapping, diffusion moderately reduced. FEV1 1.12/75%, FEV1/FVC 0.60, FEF 25-75% 0.47/27%. TLC 117% RV 168%, DLCO 57%.  10/18/12- 22 yoF former smoker, followed here for allergic rhinitis on allergy vaccine, and COPD, lung nodules/ ?MAIC FOLLOWS FOR: SOB on days with bad humidity; still on vaccine and doing well. Continues allergy vaccine 1:10 GH This has not been a bad pollen season. No cough. CT chest 10/04/12 IMPRESSION:  1. Dominant right middle lobe nodule is unchanged from 01/15/2010  and is therefore considered benign.  2. Additional scattered micro nodularity has a benign or post  infectious appearance.  3. Borderline pulmonary arterial enlargement can be seen with  pulmonary arterial hypertension.  Original Report Authenticated By: Leanna Battles, M.D.  Review of Systems-See HPI Constitutional:   No-   weight loss, night sweats, fevers, chills,+ fatigue, lassitude. HEENT:   No-  headaches, difficulty swallowing, tooth/dental problems, sore throat,       No-   sneezing, itching, ear ache, nasal congestion, post nasal drip,  CV:  No-   chest pain, orthopnea, PND, swelling in lower extremities, anasarca, dizziness, palpitations Resp: +shortness of breath with exertion or at rest.              No-   productive cough,  No non-productive cough,  No-  coughing up of blood.              No-   change in color of mucus.  No- wheezing.   Skin: No-   rash or lesions. GI:  No-   heartburn, indigestion, abdominal pain, + nausea, GU: . MS:  No-   joint pain or swelling.  Neuro- no new process Psych:  No- change in mood or affect. No depression or anxiety.  No memory loss.    Objective:   Physical Exam General- Alert, Oriented, Affect-appropriate, Distress- none acute Skin- rash-none, lesions- none, excoriation- none Lymphadenopathy- none Head- atraumatic            Eyes- Gross vision intact, PERRLA, conjunctivae clear secretions            Ears- Hearing, canals-normal            Nose- +sniffing, no-Septal dev, mucus, polyps, erosion, perforation             Throat- Mallampati II , mucosa clear , drainage- none, tonsils- atrophic Neck- flexible , trachea midline, no stridor , thyroid nl, carotid no bruit Chest - symmetrical excursion , unlabored  Heart/CV- RRR , no murmur , no gallop  , no rub, nl s1 s2                           - JVD- none , edema- none, stasis changes- none, varices- none           Lung- clear to P&A, wheeze- none, cough- none , dullness-none, rub- none           Chest wall-  Abd-  Br/ Gen/ Rectal- Not done, not indicated Extrem- cyanosis- none, clubbing, none, atrophy- none, strength- nl Neuro- grossly intact to observation; mildly tremulous

## 2012-10-19 ENCOUNTER — Ambulatory Visit: Payer: Self-pay

## 2012-10-25 ENCOUNTER — Ambulatory Visit: Payer: Self-pay

## 2012-11-02 ENCOUNTER — Ambulatory Visit (INDEPENDENT_AMBULATORY_CARE_PROVIDER_SITE_OTHER): Payer: Medicare Other

## 2012-11-02 DIAGNOSIS — J309 Allergic rhinitis, unspecified: Secondary | ICD-10-CM

## 2012-11-03 NOTE — Assessment & Plan Note (Signed)
Adequate control. She is satisfied to continue allergy vaccine.

## 2012-11-03 NOTE — Assessment & Plan Note (Signed)
Old granulomatous disease, possibly in active MAIC. Occasional chest x-ray should be sufficient.

## 2012-11-09 ENCOUNTER — Ambulatory Visit (INDEPENDENT_AMBULATORY_CARE_PROVIDER_SITE_OTHER): Payer: Medicare Other

## 2012-11-09 DIAGNOSIS — J309 Allergic rhinitis, unspecified: Secondary | ICD-10-CM

## 2012-11-16 ENCOUNTER — Ambulatory Visit: Payer: Medicare Other

## 2012-11-22 DIAGNOSIS — L723 Sebaceous cyst: Secondary | ICD-10-CM | POA: Diagnosis not present

## 2012-11-22 DIAGNOSIS — D485 Neoplasm of uncertain behavior of skin: Secondary | ICD-10-CM | POA: Diagnosis not present

## 2012-11-23 ENCOUNTER — Ambulatory Visit (INDEPENDENT_AMBULATORY_CARE_PROVIDER_SITE_OTHER): Payer: Medicare Other

## 2012-11-23 DIAGNOSIS — J309 Allergic rhinitis, unspecified: Secondary | ICD-10-CM | POA: Diagnosis not present

## 2012-11-30 ENCOUNTER — Ambulatory Visit: Payer: Self-pay

## 2012-12-07 ENCOUNTER — Ambulatory Visit (INDEPENDENT_AMBULATORY_CARE_PROVIDER_SITE_OTHER): Payer: Medicare Other

## 2012-12-07 DIAGNOSIS — J309 Allergic rhinitis, unspecified: Secondary | ICD-10-CM | POA: Diagnosis not present

## 2012-12-14 ENCOUNTER — Ambulatory Visit (INDEPENDENT_AMBULATORY_CARE_PROVIDER_SITE_OTHER): Payer: Medicare Other

## 2012-12-14 DIAGNOSIS — J309 Allergic rhinitis, unspecified: Secondary | ICD-10-CM | POA: Diagnosis not present

## 2012-12-21 ENCOUNTER — Ambulatory Visit (INDEPENDENT_AMBULATORY_CARE_PROVIDER_SITE_OTHER): Payer: Medicare Other

## 2012-12-21 DIAGNOSIS — J309 Allergic rhinitis, unspecified: Secondary | ICD-10-CM

## 2012-12-28 ENCOUNTER — Ambulatory Visit (INDEPENDENT_AMBULATORY_CARE_PROVIDER_SITE_OTHER): Payer: Medicare Other

## 2012-12-28 DIAGNOSIS — J309 Allergic rhinitis, unspecified: Secondary | ICD-10-CM

## 2012-12-31 ENCOUNTER — Encounter: Payer: Self-pay | Admitting: Internal Medicine

## 2012-12-31 ENCOUNTER — Ambulatory Visit (INDEPENDENT_AMBULATORY_CARE_PROVIDER_SITE_OTHER): Payer: Medicare Other | Admitting: Internal Medicine

## 2012-12-31 VITALS — BP 149/90 | HR 80 | Temp 98.2°F | Resp 16 | Ht 60.0 in | Wt 175.0 lb

## 2012-12-31 DIAGNOSIS — T887XXA Unspecified adverse effect of drug or medicament, initial encounter: Secondary | ICD-10-CM

## 2012-12-31 DIAGNOSIS — E785 Hyperlipidemia, unspecified: Secondary | ICD-10-CM

## 2012-12-31 DIAGNOSIS — Z23 Encounter for immunization: Secondary | ICD-10-CM

## 2012-12-31 DIAGNOSIS — Z86010 Personal history of colonic polyps: Secondary | ICD-10-CM

## 2012-12-31 DIAGNOSIS — I10 Essential (primary) hypertension: Secondary | ICD-10-CM | POA: Diagnosis not present

## 2012-12-31 LAB — BASIC METABOLIC PANEL
BUN: 26 mg/dL — ABNORMAL HIGH (ref 6–23)
Chloride: 105 mEq/L (ref 96–112)
Creatinine, Ser: 1 mg/dL (ref 0.4–1.2)
GFR: 56.51 mL/min — ABNORMAL LOW (ref >60.00)
Potassium: 4.9 mEq/L (ref 3.5–5.1)

## 2012-12-31 LAB — HEPATIC FUNCTION PANEL
ALT: 12 U/L (ref 0–35)
AST: 15 U/L (ref 0–37)
Bilirubin, Direct: 0 mg/dL (ref 0.0–0.3)
Total Bilirubin: 0.5 mg/dL (ref 0.3–1.2)

## 2012-12-31 LAB — LIPID PANEL
Cholesterol: 166 mg/dL (ref 0–200)
Total CHOL/HDL Ratio: 3
Triglycerides: 240 mg/dL — ABNORMAL HIGH (ref 0.0–149.0)
VLDL: 48 mg/dL — ABNORMAL HIGH (ref 0.0–40.0)

## 2012-12-31 LAB — CBC WITH DIFFERENTIAL/PLATELET
Basophils Absolute: 0 10*3/uL (ref 0.0–0.1)
Eosinophils Absolute: 0.1 10*3/uL (ref 0.0–0.7)
Lymphocytes Relative: 27.5 % (ref 12.0–46.0)
MCHC: 34.1 g/dL (ref 30.0–36.0)
Monocytes Relative: 6.2 % (ref 3.0–12.0)
Neutrophils Relative %: 64.5 % (ref 43.0–77.0)
RDW: 13.7 % (ref 11.5–14.6)

## 2012-12-31 LAB — TSH: TSH: 0.76 u[IU]/mL (ref 0.35–5.50)

## 2012-12-31 NOTE — Progress Notes (Signed)
Subjective:    Patient ID: [name] [name], female    DOB: [birth year], 79 y.o.   MRN: [id number]  HPI Increased stress HTN increased No SOB or chest pain  no Head aches Has been following a lower sweet diet She is on diovan and HCTZ Lacks exercise   Review of Systems  Constitutional: Negative for activity change, appetite change and fatigue.  HENT: Negative for ear pain, congestion, neck pain, postnasal drip and sinus pressure.   Eyes: Negative for redness and visual disturbance.  Respiratory: Negative for cough, shortness of breath and wheezing.   Gastrointestinal: Negative for abdominal pain and abdominal distention.  Genitourinary: Negative for dysuria, frequency and menstrual problem.  Musculoskeletal: Negative for myalgias, joint swelling and arthralgias.  Skin: Negative for rash and wound.  Neurological: Negative for dizziness, weakness and headaches.  Hematological: Negative for adenopathy. Does not bruise/bleed easily.  Psychiatric/Behavioral: Negative for sleep disturbance and decreased concentration.   Past Medical History  Diagnosis Date  . GERD (gastroesophageal reflux disease)   . Hyperlipidemia   . History of colonic polyps   . Diverticulosis   . Shingles   . Eczema   . Allergic rhinoconjunctivitis   . Lung nodule 2011  . Environmental allergies     allergy shot- q friday in Dr. Roxy Cedar office. PFT's abnormal- recommended Spiriva to use preop & will d/c after surgery  . Arthritis     low back , stenosis  . Anxiety     pt. managed- uses deep breathing   . Stenosis of popliteal artery     blood clots in legs long ago    . COPD (chronic obstructive pulmonary disease)   . H/O hiatal hernia   . Hypertension     History   Social History  . Marital Status: Single    Spouse Name: N/A    Number of Children: N/A  . Years of Education: N/A   Occupational History  . Not on file.   Social History Main Topics  . Smoking status: Former Smoker -- 2.00  packs/day for 30 years    Types: Cigarettes    Quit date: 06/18/1993  . Smokeless tobacco: Never Used     Comment: QUIT IN 1995  . Alcohol Use: No  . Drug Use: No  . Sexual Activity: Not on file   Other Topics Concern  . Not on file   Social History Narrative   ** Merged History Encounter **        Past Surgical History  Procedure Laterality Date  . Tubal ligation    . Cholecystectomy  1998  . Femoral-popliteal bypass graft         x2 surgeries 1990's & 2009  . Eye surgery      cataracts removed- bilateral /w IOL  . Tonsillectomy      as a teenager   . Tonsilectomy, adenoidectomy, bilateral myringotomy and tubes      no myringotomy tubes   . Lumbar fusion  07/06/2011  . Spine surgery  March 2013    Family History  Problem Relation Age of Onset  . Arthritis Mother   . Heart disease Father   . Colon polyps Father   . Breast cancer Sister   . Lung cancer Sister   . Colon cancer Sister   . Lung cancer Brother   . Anesthesia problems Neg Hx   . Hypotension Neg Hx   . Malignant hyperthermia Neg Hx   . Pseudochol deficiency Neg Hx  Allergies  Allergen Reactions  . Latex     REACTION: rash  . Sulfa Antibiotics Other (See Comments)    Cold sweat light headed   . Sulfamethoxazole     REACTION: disorientation  . Tiotropium Bromide Other (See Comments)    Sore throat    Current Outpatient Prescriptions on File Prior to Visit  Medication Sig Dispense Refill  . aspirin 81 MG tablet Take 81 mg by mouth daily.      . calcium-vitamin D (OSCAL WITH D 500-200) 500-200 MG-UNIT per tablet Take 1 tablet by mouth daily.        . carbamazepine (CARBATROL) 100 MG 12 hr capsule Take 100 mg by mouth 2 (two) times daily. Takes one at night and may take a second in the morning for pain      . Cholecalciferol (VITAMIN D3) 1000 UNITS CAPS Take 1,000 Units by mouth daily.       . Fluticasone-Salmeterol (ADVAIR) 100-50 MCG/DOSE AEPB Inhale 1 puff into the lungs 2 (two) times daily.   60 each  5  . omeprazole (PRILOSEC) 20 MG capsule Take 20 mg by mouth every morning.       . simvastatin (ZOCOR) 40 MG tablet TAKE 1 TABLET BY MOUTH DAILY AT BEDTIME  90 tablet  3  . valsartan-hydrochlorothiazide (DIOVAN-HCT) 160-25 MG per tablet TAKE ONE TABLET BY MOUTH DAILY  90 tablet  3   No current facility-administered medications on file prior to visit.    BP 149/90  Pulse 80  Temp(Src) 98.2 F (36.8 C)  Resp 16  Ht 5' (1.524 m)  Wt 175 lb (79.379 kg)  BMI 34.18 kg/m2       Objective:   Physical Exam  Constitutional: She is oriented to person, place, and time. She appears well-developed and well-nourished.  Eyes: Conjunctivae are normal. Pupils are equal, round, and reactive to light.  Cardiovascular: Normal rate and regular rhythm.   Murmur heard. Abdominal: She exhibits no distension. There is no tenderness.  Musculoskeletal: She exhibits edema and tenderness.  Neurological: She is alert and oriented to person, place, and time.  Skin: Skin is warm and dry.          Assessment & Plan:  Back pain stable Mood and aggrivation with sister contributes to elevation of blood pressure  Asthma stable GERD stable Takes aleve for back pain that is acceptable Has not been exercising

## 2013-01-01 LAB — LDL CHOLESTEROL, DIRECT: Direct LDL: 80.1 mg/dL

## 2013-01-03 ENCOUNTER — Encounter: Payer: Self-pay | Admitting: Neurology

## 2013-01-03 ENCOUNTER — Ambulatory Visit (INDEPENDENT_AMBULATORY_CARE_PROVIDER_SITE_OTHER): Payer: Medicare Other | Admitting: Neurology

## 2013-01-03 VITALS — BP 156/76 | HR 71 | Resp 16 | Wt 183.0 lb

## 2013-01-03 DIAGNOSIS — R251 Tremor, unspecified: Secondary | ICD-10-CM

## 2013-01-03 DIAGNOSIS — R259 Unspecified abnormal involuntary movements: Secondary | ICD-10-CM

## 2013-01-03 DIAGNOSIS — G2589 Other specified extrapyramidal and movement disorders: Secondary | ICD-10-CM

## 2013-01-03 DIAGNOSIS — G248 Other dystonia: Secondary | ICD-10-CM

## 2013-01-03 DIAGNOSIS — G500 Trigeminal neuralgia: Secondary | ICD-10-CM

## 2013-01-03 MED ORDER — CARBAMAZEPINE ER 100 MG PO CP12: 100.0000 mg | ORAL_CAPSULE | Freq: Two times a day (BID) | ORAL | Status: DC

## 2013-01-03 NOTE — Progress Notes (Signed)
[facility]  Provider:  Melvyn Novas, M D  Referring Provider: Stacie Glaze, MD Primary Care Physician:  Carrie Mew, MD    HPI:  [name] [name] is a 79 y.o. female  Is seen here as a referral/ revisit  from Dr. Lovell Sheehan for the visit. I had seen the patient last in 2012 when she was followed here for her trigeminal neuralgia. She had reported at that time occupational reefed discomfort when chewing. She did have an emergency room visit for a fainting spell she fell from the commode. The patient also underwent multiple back surgeries in spring of last year and spent afterwards 3 weeks  in [facility] for rehabilitation. The patient has in the past had operation for a bypass of the femoral popliteal artery.  She was temporarily on oxycodone for the treatment of back pain which she has neither enjoyed  nor maintained.  The patient has been a member in the [employer] program until her 91 year old sister fell and fractured her spine and hip . 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 She had no recent falls, no balance issues, no headaches. She has occasionally noted a tremor in her right hand which is still there.       Review of Systems: Out of a complete 14 system review, the patient complains of only the following symptoms, and all other reviewed systems are negative.  trigemial neuralgia has changed from jaw pain, to a more electric sensation in the mouth.  Drooling, tremor right hand.   History   Social History  . Marital Status: Single    Spouse Name: N/A    Number of Children: N/A  . Years of Education: N/A   Occupational History  . Not on file.   Social History Main Topics  . Smoking status: Former Smoker -- 2.00 packs/day for 30 years    Types: Cigarettes    Quit date: 06/18/1993  . Smokeless tobacco: Never Used     Comment: QUIT IN 1995  . Alcohol Use: No  . Drug Use: No  .  Sexual Activity: Not on file   Other Topics Concern  . Not on file   Social History Narrative   ** Merged History Encounter **        Family History  Problem Relation Age of Onset  . Arthritis Mother   . Heart disease Father   . Colon polyps Father   . Breast cancer Sister   . Lung cancer Sister   . Colon cancer Sister   . Lung cancer Brother   . Anesthesia problems Neg Hx   . Hypotension Neg Hx   . Malignant hyperthermia Neg Hx   . Pseudochol deficiency Neg Hx     Past Medical History  Diagnosis Date  . GERD (gastroesophageal reflux disease)   . Hyperlipidemia   . History of colonic polyps   . Diverticulosis   . Shingles   . Eczema   . Allergic rhinoconjunctivitis   . Lung nodule 2011  . Environmental allergies     allergy shot- q friday in Dr. Roxy Cedar office. PFT's abnormal- recommended Spiriva to use preop & will d/c after surgery  . Arthritis     low back , stenosis  . Anxiety     pt. managed- uses deep breathing   . Stenosis of popliteal artery     blood clots in legs long ago    .  COPD (chronic obstructive pulmonary disease)   . H/O hiatal hernia   . Hypertension   . Trigeminal neuralgia   . Peripheral vascular disease     Past Surgical History  Procedure Laterality Date  . Tubal ligation    . Cholecystectomy  1998  . Femoral-popliteal bypass graft         x2 surgeries 1990's & 2009  . Eye surgery      cataracts removed- bilateral /w IOL  . Tonsillectomy      as a teenager   . Tonsilectomy, adenoidectomy, bilateral myringotomy and tubes      no myringotomy tubes   . Lumbar fusion  07/06/2011  . Spine surgery  March 2013    Current Outpatient Prescriptions  Medication Sig Dispense Refill  . aspirin 81 MG tablet Take 81 mg by mouth daily.      . calcium-vitamin D (OSCAL WITH D 500-200) 500-200 MG-UNIT per tablet Take 1 tablet by mouth daily.        . carbamazepine (CARBATROL) 100 MG 12 hr capsule Take 100 mg by mouth 2 (two) times daily. Takes  one at night and may take a second in the morning for pain      . Cholecalciferol (VITAMIN D3) 1000 UNITS CAPS Take 1,000 Units by mouth daily.       . Fluticasone-Salmeterol (ADVAIR) 100-50 MCG/DOSE AEPB Inhale 1 puff into the lungs 2 (two) times daily.  60 each  5  . omeprazole (PRILOSEC) 20 MG capsule Take 20 mg by mouth every morning.       . simvastatin (ZOCOR) 40 MG tablet TAKE 1 TABLET BY MOUTH DAILY AT BEDTIME  90 tablet  3  . valsartan-hydrochlorothiazide (DIOVAN-HCT) 160-25 MG per tablet TAKE ONE TABLET BY MOUTH DAILY  90 tablet  3   No current facility-administered medications for this visit.    Allergies as of 01/03/2013 - Review Complete 01/03/2013  Allergen Reaction Noted  . Latex    . Sulfa antibiotics Other (See Comments) 12/05/2011  . Sulfamethoxazole  07/17/2006  . Tiotropium bromide Other (See Comments) 06/21/2011    Vitals: BP 156/76  Pulse 71  Resp 16  Wt 183 lb (83.008 kg)  BMI 35.14 kg/m2 Last Weight:  Wt Readings from Last 1 Encounters:  01/03/13 183 lb (83.008 kg)   Last Height:   Ht Readings from Last 1 Encounters:  01/03/13 5' 0.5" (1.537 m)   Physical exam:  General: The patient is awake, alert and appears not in acute distress. The patient is well groomed. Head: Normocephalic, atraumatic. Neck is supple. Mallampati 3 neck circumference: 16 inches,  Cardiovascular:  Regular rate and rhythm , without  murmurs or carotid bruit, and without distended neck veins. Respiratory: Lungs are clear to auscultation. Skin:  Without evidence of edema, or rash Trunk: BMI is elevated.  Neurologic exam : The patient is awake and alert, oriented to place and time.  Memory subjective  described as intact. There is a normal attention span & concentration ability.  Speech is fluent without  dysarthria, dysphonia or aphasia. Mood and affect are appropriate.  Cranial nerves: Pupils are equal and briskly reactive to light. Funduscopic exam without evidence of pallor  or edema. Extraocular movements  in vertical and horizontal planes intact and without nystagmus. Visual fields by finger perimetry are intact. Hearing to finger rub intact.  Facial sensation intact to fine touch.  Facial motor strength is symmetric and tongue and uvula move midline.  Motor exam:   Normal  tone and normal muscle bulk and symmetric normal strength in all extremities.  Sensory:  Fine touch, pinprick and vibration were tested in all extremities. Proprioception is normal.  Coordination: Rapid alternating movements in the fingers/hands is tested and normal. Finger-to-nose maneuver tested and normal without evidence of ataxia, dysmetria and only mild  tremor.  Gait and station: Patient walks without assistive device .Stance is stable and normal. Tandem gait is deferred.   Deep tendon reflexes: in the  upper and lower extremities are symmetric and intact. Babinski maneuver response is  downgoing.   Assessment:    Patient has a history of left sided trigeminal nausea which is only residually present now. She is responded well to Tegretol. Independent of this she reports that spicy foods sometimes triggers a coughing response that she didn't used to have in the past she does not experience the same the salty, suite or sour food.  She has no measurable side effects from the Tegretol that she takes for the treatment of trigeminal ulcer. There is no nystagmus,  No ataxia. her gait is steady I do not see a resting tremor and her muscle tone is a normal range. She has ankle edema but traces of reflexes are present in her lower extremities as well.  Her tremor is associated with a dystonia of the  Fingers of the right hand and barely noticable at trest   Plan refill meds.  Labs once yearly for natrium and WBC count.

## 2013-01-04 ENCOUNTER — Ambulatory Visit (INDEPENDENT_AMBULATORY_CARE_PROVIDER_SITE_OTHER): Payer: Medicare Other

## 2013-01-04 DIAGNOSIS — J309 Allergic rhinitis, unspecified: Secondary | ICD-10-CM | POA: Diagnosis not present

## 2013-01-04 MED ORDER — CARBAMAZEPINE 200 MG PO TABS: 200.0000 mg | ORAL_TABLET | Freq: Three times a day (TID) | ORAL | Status: DC

## 2013-01-11 ENCOUNTER — Ambulatory Visit (INDEPENDENT_AMBULATORY_CARE_PROVIDER_SITE_OTHER): Payer: Medicare Other

## 2013-01-11 DIAGNOSIS — J309 Allergic rhinitis, unspecified: Secondary | ICD-10-CM | POA: Diagnosis not present

## 2013-01-18 ENCOUNTER — Telehealth: Payer: Self-pay | Admitting: Internal Medicine

## 2013-01-18 ENCOUNTER — Ambulatory Visit (INDEPENDENT_AMBULATORY_CARE_PROVIDER_SITE_OTHER): Payer: Medicare Other

## 2013-01-18 DIAGNOSIS — J309 Allergic rhinitis, unspecified: Secondary | ICD-10-CM

## 2013-01-18 NOTE — Telephone Encounter (Signed)
Pt called and stated that she needed proof of her flu shot faxed to Amy Postel [phone]. She volunteers at [employer], and will need record of this. Please assist.

## 2013-01-18 NOTE — Telephone Encounter (Signed)
Immunization report faxed

## 2013-01-21 ENCOUNTER — Other Ambulatory Visit: Payer: Self-pay | Admitting: *Deleted

## 2013-01-21 ENCOUNTER — Other Ambulatory Visit: Payer: Self-pay | Admitting: Internal Medicine

## 2013-01-21 MED ORDER — VALSARTAN-HYDROCHLOROTHIAZIDE 160-25 MG PO TABS: ORAL_TABLET | ORAL | Status: DC

## 2013-01-25 ENCOUNTER — Ambulatory Visit (INDEPENDENT_AMBULATORY_CARE_PROVIDER_SITE_OTHER): Payer: Medicare Other

## 2013-01-25 DIAGNOSIS — J309 Allergic rhinitis, unspecified: Secondary | ICD-10-CM

## 2013-02-01 ENCOUNTER — Ambulatory Visit (INDEPENDENT_AMBULATORY_CARE_PROVIDER_SITE_OTHER): Payer: Medicare Other

## 2013-02-01 DIAGNOSIS — J309 Allergic rhinitis, unspecified: Secondary | ICD-10-CM | POA: Diagnosis not present

## 2013-02-08 ENCOUNTER — Ambulatory Visit (INDEPENDENT_AMBULATORY_CARE_PROVIDER_SITE_OTHER): Payer: Medicare Other

## 2013-02-08 DIAGNOSIS — J309 Allergic rhinitis, unspecified: Secondary | ICD-10-CM | POA: Diagnosis not present

## 2013-02-12 ENCOUNTER — Ambulatory Visit (INDEPENDENT_AMBULATORY_CARE_PROVIDER_SITE_OTHER): Payer: Medicare Other

## 2013-02-12 DIAGNOSIS — J309 Allergic rhinitis, unspecified: Secondary | ICD-10-CM

## 2013-02-15 ENCOUNTER — Ambulatory Visit: Payer: Self-pay

## 2013-02-22 ENCOUNTER — Ambulatory Visit (INDEPENDENT_AMBULATORY_CARE_PROVIDER_SITE_OTHER): Payer: Medicare Other

## 2013-02-22 DIAGNOSIS — J309 Allergic rhinitis, unspecified: Secondary | ICD-10-CM

## 2013-03-01 ENCOUNTER — Ambulatory Visit (INDEPENDENT_AMBULATORY_CARE_PROVIDER_SITE_OTHER): Payer: Medicare Other

## 2013-03-01 DIAGNOSIS — J309 Allergic rhinitis, unspecified: Secondary | ICD-10-CM | POA: Diagnosis not present

## 2013-03-08 ENCOUNTER — Ambulatory Visit (INDEPENDENT_AMBULATORY_CARE_PROVIDER_SITE_OTHER): Payer: Medicare Other

## 2013-03-08 DIAGNOSIS — J309 Allergic rhinitis, unspecified: Secondary | ICD-10-CM

## 2013-03-15 ENCOUNTER — Ambulatory Visit (INDEPENDENT_AMBULATORY_CARE_PROVIDER_SITE_OTHER): Payer: Medicare Other

## 2013-03-15 DIAGNOSIS — J309 Allergic rhinitis, unspecified: Secondary | ICD-10-CM

## 2013-03-22 ENCOUNTER — Ambulatory Visit: Payer: Self-pay

## 2013-03-25 DIAGNOSIS — Z1231 Encounter for screening mammogram for malignant neoplasm of breast: Secondary | ICD-10-CM | POA: Diagnosis not present

## 2013-03-25 DIAGNOSIS — Z803 Family history of malignant neoplasm of breast: Secondary | ICD-10-CM | POA: Diagnosis not present

## 2013-03-27 ENCOUNTER — Encounter: Payer: Self-pay | Admitting: Internal Medicine

## 2013-03-29 ENCOUNTER — Ambulatory Visit (INDEPENDENT_AMBULATORY_CARE_PROVIDER_SITE_OTHER): Payer: Medicare Other

## 2013-03-29 DIAGNOSIS — J309 Allergic rhinitis, unspecified: Secondary | ICD-10-CM

## 2013-04-01 ENCOUNTER — Encounter: Payer: Self-pay | Admitting: Internal Medicine

## 2013-04-02 NOTE — Telephone Encounter (Signed)
1 

## 2013-04-05 ENCOUNTER — Ambulatory Visit (INDEPENDENT_AMBULATORY_CARE_PROVIDER_SITE_OTHER): Payer: Medicare Other

## 2013-04-05 DIAGNOSIS — J309 Allergic rhinitis, unspecified: Secondary | ICD-10-CM

## 2013-04-10 ENCOUNTER — Encounter: Payer: Self-pay | Admitting: Internal Medicine

## 2013-04-12 ENCOUNTER — Ambulatory Visit: Payer: Self-pay

## 2013-04-19 ENCOUNTER — Ambulatory Visit (INDEPENDENT_AMBULATORY_CARE_PROVIDER_SITE_OTHER): Payer: Medicare Other

## 2013-04-19 ENCOUNTER — Telehealth: Payer: Self-pay | Admitting: Internal Medicine

## 2013-04-19 DIAGNOSIS — J309 Allergic rhinitis, unspecified: Secondary | ICD-10-CM

## 2013-04-19 NOTE — Telephone Encounter (Signed)
Called, spoke with pt.  She had a pna shot in approx 2007.  She has seen the Prevnar 13 shot advertised on TV.  Would like to know if she should get this as a booster to the pna shot from 2007.  If so, she would like to have it done next Friday when she comes for Allergy shots.  Dr. Maple Hudson, pls advise.  Thank you.  Last pna shot:  approx 2007 per pt Last flu shot: Sept 2014  Last OV with [facility]: 10/18/12; asked to f/u in 1 yr  Allergies  Allergen Reactions  . Latex     REACTION: rash  . Sulfa Antibiotics Other (See Comments)    Cold sweat light headed   . Sulfamethoxazole     REACTION: disorientation  . Tiotropium Bromide Other (See Comments)    Sore throat

## 2013-04-19 NOTE — Telephone Encounter (Signed)
Called, spoke with pt.  We have scheduled her for Prevnar injection on Friday, Jan 2 with allergy shot.  Pt aware and voiced no further questions or concerns at this time.

## 2013-04-19 NOTE — Telephone Encounter (Signed)
Ok to get Prevnar pneumonia conjugate vaccine 13 at next allergy shot day.

## 2013-04-23 IMAGING — CR DG CHEST 2V
2 series · 2 of 2 positions shown · non-contrast
Comparison: 01/11/2010.  CT 12/03/2010.

CLINICAL DATA: Preoperative evaluation.  History of previous
smoking.Chronic right lung pulmonary nodular densities.

CHEST - 2 VIEW

[view not recorded (1 of 2)]
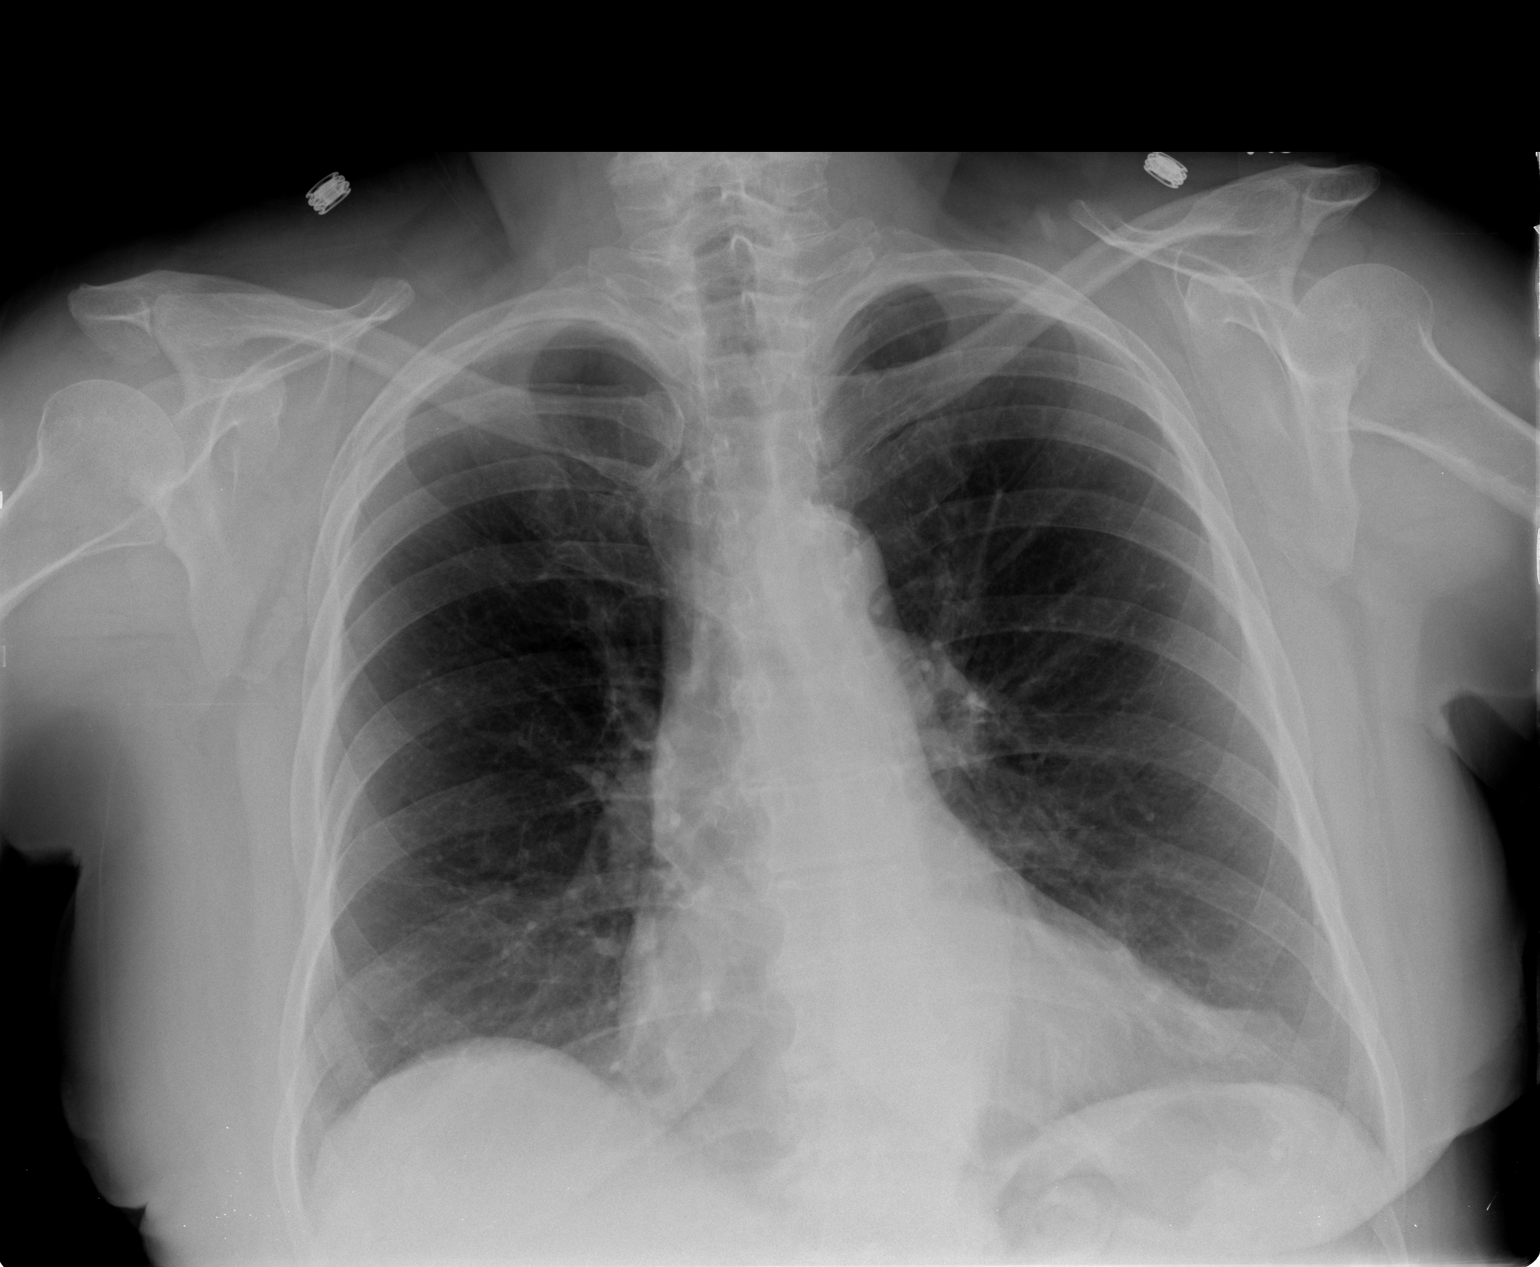

[view not recorded (2 of 2)]
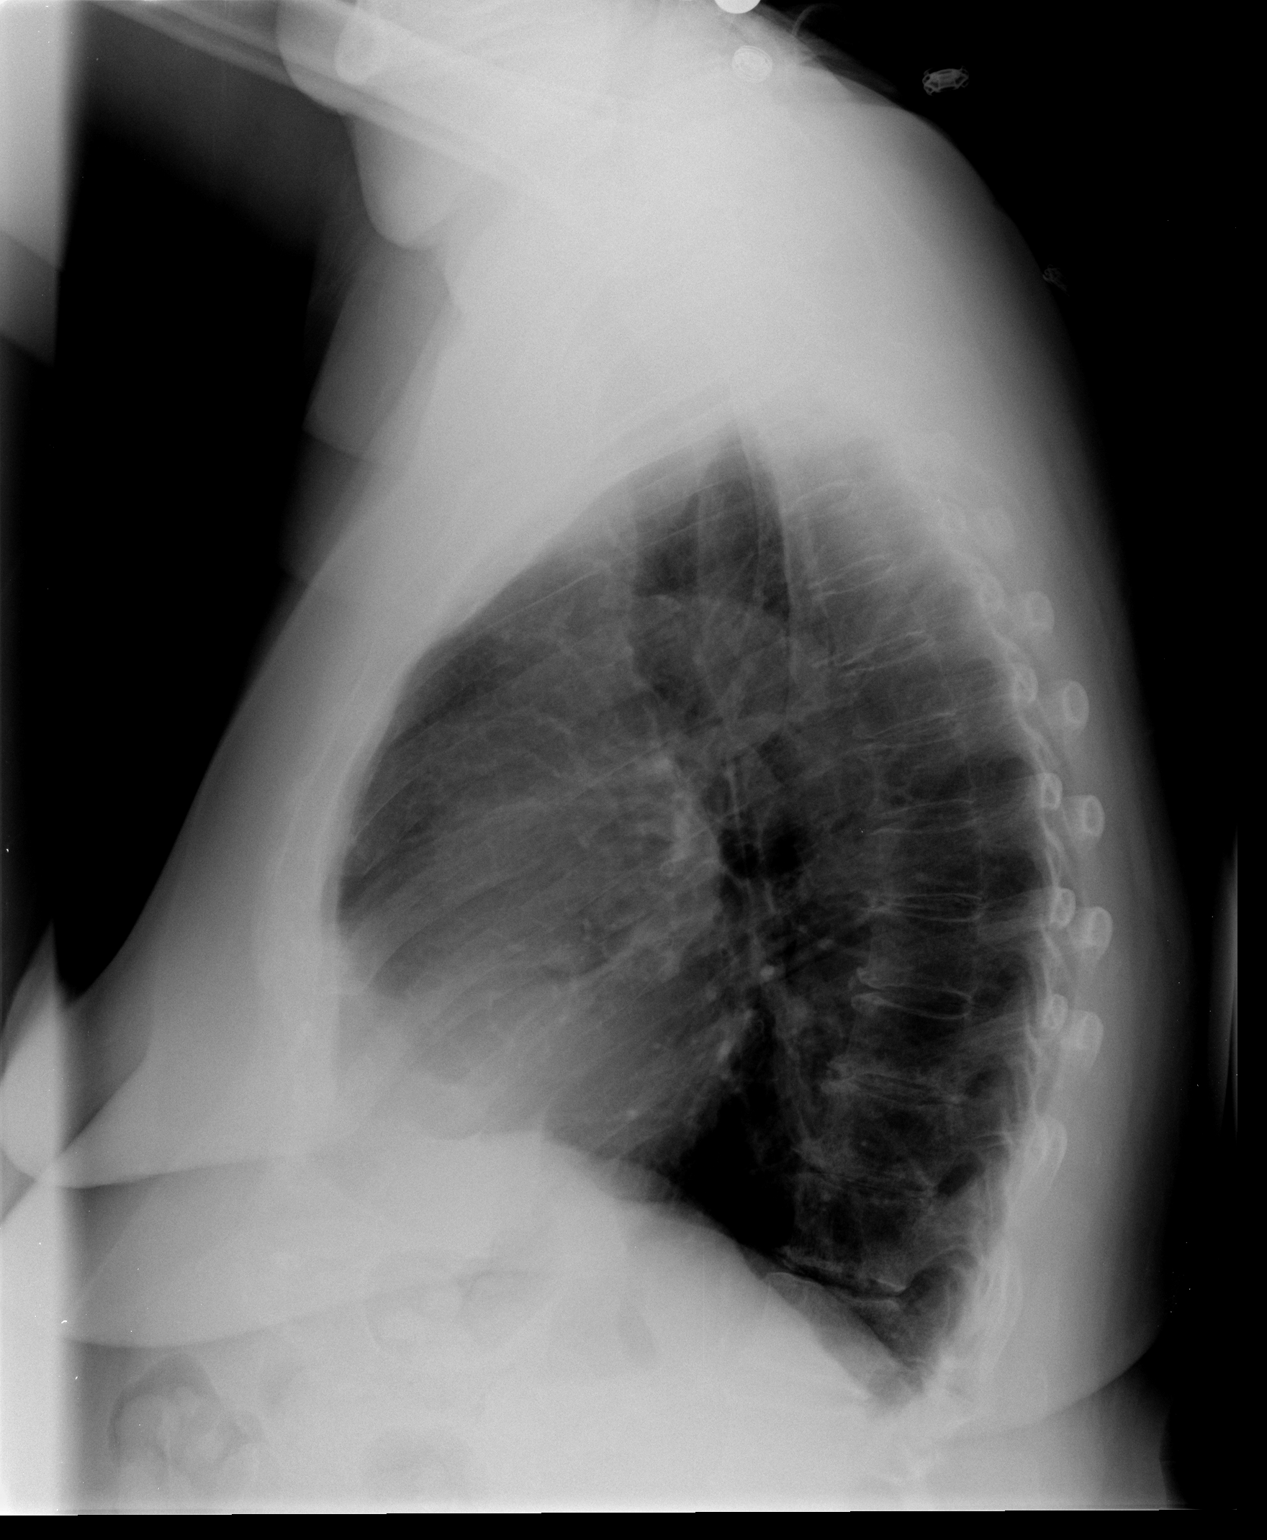

[2 of 2 positions shown; findings below may reference images not displayed]

FINDINGS: Cardiac silhouette is upper normal size. Ectasia and
nonaneurysmal calcification of the thoracic aorta are seen..
Mediastinal and hilar contours appear stable.  Small nodular
densities have been previously demonstrated within the right middle
lobe.  These are faintly seen on the PA radiographic examination
and appear unchanged.  There is epicardial fat density in left
cardiophrenic angle region unchanged.  No pulmonary edema, pleural
effusion, or other acute abnormality is seen.  There is slight
overall hyperinflation configuration.  There is osteopenic
appearance of bones.  There is degenerative spondylosis.
IMPRESSION: No acute or active cardiopulmonary or pleural abnormalities are
seen.  Stable nodularity in right middle lobe by plain image..
Slight hyperinflation configuration suggests element of COPD.

## 2013-04-26 ENCOUNTER — Ambulatory Visit: Payer: Self-pay

## 2013-04-26 ENCOUNTER — Ambulatory Visit (INDEPENDENT_AMBULATORY_CARE_PROVIDER_SITE_OTHER): Payer: Medicare Other

## 2013-04-26 DIAGNOSIS — J309 Allergic rhinitis, unspecified: Secondary | ICD-10-CM

## 2013-05-03 ENCOUNTER — Ambulatory Visit: Payer: Self-pay

## 2013-05-10 ENCOUNTER — Ambulatory Visit (INDEPENDENT_AMBULATORY_CARE_PROVIDER_SITE_OTHER): Payer: Medicare Other

## 2013-05-10 DIAGNOSIS — J309 Allergic rhinitis, unspecified: Secondary | ICD-10-CM | POA: Diagnosis not present

## 2013-05-17 ENCOUNTER — Ambulatory Visit (INDEPENDENT_AMBULATORY_CARE_PROVIDER_SITE_OTHER): Payer: Medicare Other

## 2013-05-17 DIAGNOSIS — J309 Allergic rhinitis, unspecified: Secondary | ICD-10-CM | POA: Diagnosis not present

## 2013-05-24 ENCOUNTER — Ambulatory Visit (INDEPENDENT_AMBULATORY_CARE_PROVIDER_SITE_OTHER): Payer: Medicare Other

## 2013-05-24 DIAGNOSIS — J309 Allergic rhinitis, unspecified: Secondary | ICD-10-CM | POA: Diagnosis not present

## 2013-05-31 ENCOUNTER — Ambulatory Visit (INDEPENDENT_AMBULATORY_CARE_PROVIDER_SITE_OTHER): Payer: Medicare Other

## 2013-05-31 DIAGNOSIS — J309 Allergic rhinitis, unspecified: Secondary | ICD-10-CM | POA: Diagnosis not present

## 2013-06-07 ENCOUNTER — Ambulatory Visit (INDEPENDENT_AMBULATORY_CARE_PROVIDER_SITE_OTHER): Payer: Medicare Other

## 2013-06-07 DIAGNOSIS — J309 Allergic rhinitis, unspecified: Secondary | ICD-10-CM

## 2013-06-14 ENCOUNTER — Ambulatory Visit: Payer: Self-pay

## 2013-06-27 ENCOUNTER — Encounter (HOSPITAL_COMMUNITY): Payer: Self-pay

## 2013-06-27 ENCOUNTER — Ambulatory Visit (INDEPENDENT_AMBULATORY_CARE_PROVIDER_SITE_OTHER): Payer: Medicare Other

## 2013-06-27 ENCOUNTER — Other Ambulatory Visit (HOSPITAL_COMMUNITY): Payer: Self-pay

## 2013-06-27 ENCOUNTER — Ambulatory Visit: Payer: Self-pay | Admitting: Family

## 2013-06-27 DIAGNOSIS — J309 Allergic rhinitis, unspecified: Secondary | ICD-10-CM | POA: Diagnosis not present

## 2013-07-01 ENCOUNTER — Ambulatory Visit (INDEPENDENT_AMBULATORY_CARE_PROVIDER_SITE_OTHER): Payer: Medicare Other | Admitting: Internal Medicine

## 2013-07-01 ENCOUNTER — Encounter: Payer: Self-pay | Admitting: Internal Medicine

## 2013-07-01 VITALS — BP 136/74 | HR 67 | Temp 98.1°F | Resp 20 | Wt 182.0 lb

## 2013-07-01 DIAGNOSIS — R739 Hyperglycemia, unspecified: Secondary | ICD-10-CM

## 2013-07-01 DIAGNOSIS — R7309 Other abnormal glucose: Secondary | ICD-10-CM | POA: Diagnosis not present

## 2013-07-01 DIAGNOSIS — I10 Essential (primary) hypertension: Secondary | ICD-10-CM

## 2013-07-01 DIAGNOSIS — R635 Abnormal weight gain: Secondary | ICD-10-CM | POA: Diagnosis not present

## 2013-07-01 LAB — T4, FREE: Free T4: 0.67 ng/dL (ref 0.60–1.60)

## 2013-07-01 LAB — T3, FREE: T3, Free: 2.3 pg/mL (ref 2.3–4.2)

## 2013-07-01 LAB — HEMOGLOBIN A1C: Hgb A1c MFr Bld: 6.1 % (ref 4.6–6.5)

## 2013-07-01 NOTE — Progress Notes (Signed)
   Subjective:    Patient ID: [name] [name], female    DOB: Nov 13, 1924, 80 y.o.   MRN: [id number]  HPI  Weight control is an issue  Has eliminated sweets Exercise is limited Has been on diet for one month and has gained weight Feels bloating   Review of Systems  Constitutional: Negative for activity change, appetite change and fatigue.  HENT: Negative for congestion, ear pain, postnasal drip and sinus pressure.   Eyes: Negative for redness and visual disturbance.  Respiratory: Negative for cough, shortness of breath and wheezing.   Gastrointestinal: Negative for abdominal pain and abdominal distention.  Genitourinary: Negative for dysuria, frequency and menstrual problem.  Musculoskeletal: Negative for arthralgias, joint swelling, myalgias and neck pain.  Skin: Negative for rash and wound.  Neurological: Negative for dizziness, weakness and headaches.  Hematological: Negative for adenopathy. Does not bruise/bleed easily.  Psychiatric/Behavioral: Negative for sleep disturbance and decreased concentration.       Objective:   Physical Exam  Constitutional: She is oriented to person, place, and time. She appears well-developed and well-nourished. No distress.  HENT:  Head: Normocephalic and atraumatic.  Eyes: Conjunctivae and EOM are normal. Pupils are equal, round, and reactive to light.  Neck: Normal range of motion. Neck supple. No JVD present. No tracheal deviation present. No thyromegaly present.  Cardiovascular: Normal rate and regular rhythm.   Murmur heard. Pulmonary/Chest: Effort normal and breath sounds normal. She has no wheezes. She exhibits no tenderness.  Abdominal: Soft. Bowel sounds are normal.  Musculoskeletal: Normal range of motion. She exhibits no edema and no tenderness.  Lymphadenopathy:    She has no cervical adenopathy.  Neurological: She is alert and oriented to person, place, and time. She has normal reflexes. No cranial nerve deficit.  Skin: Skin is  warm and dry. She is not diaphoretic.  Psychiatric: She has a normal mood and affect. Her behavior is normal.          Assessment & Plan:  assess  Diet  I have spent more than 30 minutes examining this patient face-to-face of which over half was spent in counseling Diet given.

## 2013-07-01 NOTE — Progress Notes (Signed)
Pre-visit discussion using our clinic review tool. No additional management support is needed unless otherwise documented below in the visit note.  

## 2013-07-01 NOTE — Patient Instructions (Signed)
Diet recommendations  Breakfast:  Oatmeal or 2 eggs    And small fruit serving or berries for tree fruit best  Lunch veggies and chicken breast or lean beef or pork  Dinner Root veg.  And green veg  And meat.  May use butter in pan!   Gluten is in breads, dressing prepared foods!!!!

## 2013-07-02 ENCOUNTER — Telehealth: Payer: Self-pay | Admitting: Internal Medicine

## 2013-07-02 NOTE — Telephone Encounter (Signed)
Relevant patient education assigned to patient using Emmi. ° °

## 2013-07-05 ENCOUNTER — Ambulatory Visit (INDEPENDENT_AMBULATORY_CARE_PROVIDER_SITE_OTHER): Payer: Medicare Other

## 2013-07-05 DIAGNOSIS — J309 Allergic rhinitis, unspecified: Secondary | ICD-10-CM

## 2013-07-12 ENCOUNTER — Ambulatory Visit (INDEPENDENT_AMBULATORY_CARE_PROVIDER_SITE_OTHER): Payer: Medicare Other

## 2013-07-12 DIAGNOSIS — J309 Allergic rhinitis, unspecified: Secondary | ICD-10-CM

## 2013-07-19 ENCOUNTER — Ambulatory Visit (INDEPENDENT_AMBULATORY_CARE_PROVIDER_SITE_OTHER): Payer: Medicare Other

## 2013-07-19 DIAGNOSIS — J309 Allergic rhinitis, unspecified: Secondary | ICD-10-CM

## 2013-07-23 ENCOUNTER — Ambulatory Visit (INDEPENDENT_AMBULATORY_CARE_PROVIDER_SITE_OTHER): Payer: Medicare Other

## 2013-07-23 ENCOUNTER — Other Ambulatory Visit: Payer: Self-pay | Admitting: Vascular Surgery

## 2013-07-23 DIAGNOSIS — I739 Peripheral vascular disease, unspecified: Secondary | ICD-10-CM

## 2013-07-23 DIAGNOSIS — J309 Allergic rhinitis, unspecified: Secondary | ICD-10-CM | POA: Diagnosis not present

## 2013-07-23 DIAGNOSIS — Z48812 Encounter for surgical aftercare following surgery on the circulatory system: Secondary | ICD-10-CM

## 2013-07-24 ENCOUNTER — Encounter: Payer: Self-pay | Admitting: Family

## 2013-07-25 ENCOUNTER — Encounter: Payer: Self-pay | Admitting: Family

## 2013-07-25 ENCOUNTER — Ambulatory Visit (INDEPENDENT_AMBULATORY_CARE_PROVIDER_SITE_OTHER)
Admission: RE | Admit: 2013-07-25 | Discharge: 2013-07-25 | Disposition: A | Discharge status: Home / Self Care | Payer: Medicare Other | Source: Ambulatory Visit | Attending: Family | Admitting: Family

## 2013-07-25 ENCOUNTER — Ambulatory Visit: Payer: Self-pay

## 2013-07-25 ENCOUNTER — Ambulatory Visit (INDEPENDENT_AMBULATORY_CARE_PROVIDER_SITE_OTHER): Payer: Medicare Other | Admitting: Family

## 2013-07-25 ENCOUNTER — Ambulatory Visit (HOSPITAL_COMMUNITY)
Admission: RE | Admit: 2013-07-25 | Discharge: 2013-07-25 | Disposition: A | Discharge status: Home / Self Care | Payer: Medicare Other | Source: Ambulatory Visit | Attending: Family | Admitting: Family

## 2013-07-25 VITALS — BP 144/72 | HR 59 | Resp 16 | Ht 62.0 in | Wt 170.0 lb

## 2013-07-25 DIAGNOSIS — Z48812 Encounter for surgical aftercare following surgery on the circulatory system: Secondary | ICD-10-CM | POA: Insufficient documentation

## 2013-07-25 DIAGNOSIS — I739 Peripheral vascular disease, unspecified: Secondary | ICD-10-CM

## 2013-07-25 NOTE — Progress Notes (Signed)
[facility] [place] HISTORY AND PHYSICAL -PAD  History of Present Illness [name] [name] is a 80 y.o. female patient of Dr. Oneida Alar who is status post bilateral femoropopliteal bypass with [name] [phone] and 2009), she's also had thrombolysis angioplasty of the right femoropopliteal bypass in 2010 and thrombectomy angioplasty right interpositional graft from above the knee to below the knee popliteal artery in 2010. Her walking is limited by low back pain, she has had lumbar fusion in March, 2013, states she was told that she has arthritis in her back which is causing this pain. 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 She not longer has claudication symptoms with walking since the above vascular procedures, she denies non healing wounds.  She denies any history of stroke or TIA symptoms, denies cardiac problems. She has been making a concerted effort to lose weight.  The patient denies New Medical or Surgical History.  Pt Diabetic: No Pt smoker: former smoker, quit 20 years ago  Pt meds include: Statin :Yes ASA: Yes Other anticoagulants/antiplatelets: no  Past Medical History  Diagnosis Date  . GERD (gastroesophageal reflux disease)   . Hyperlipidemia   . History of colonic polyps   . Diverticulosis   . Shingles   . Eczema   . Allergic rhinoconjunctivitis   . Lung nodule 2011  . Environmental allergies     allergy shot- q friday in Dr. Janee Morn office. PFT's abnormal- recommended Spiriva to use preop & will d/c after surgery  . Arthritis     low back , stenosis  . Anxiety     pt. managed- uses deep breathing   . Stenosis of popliteal artery     blood clots in legs long ago    . COPD (chronic obstructive pulmonary disease)   . H/O hiatal hernia   . Hypertension   . Trigeminal neuralgia   . Peripheral vascular disease     Social History History  Substance Use Topics  . Smoking status: Former Smoker -- 2.00 packs/day for 30 years    Types: Cigarettes     Quit date: 06/18/1993  . Smokeless tobacco: Never Used     Comment: QUIT IN 1995  . Alcohol Use: No    Family History Family History  Problem Relation Age of Onset  . Arthritis Mother   . Heart disease Father   . Colon polyps Father   . Breast cancer Sister   . Lung cancer Sister   . Colon cancer Sister   . Lung cancer Brother   . Anesthesia problems Neg Hx   . Hypotension Neg Hx   . Malignant hyperthermia Neg Hx   . Pseudochol deficiency Neg Hx     Past Surgical History  Procedure Laterality Date  . Tubal ligation    . Cholecystectomy  1998  . Femoral-popliteal bypass graft         x2 surgeries 1990's & 2009  . Eye surgery      cataracts removed- bilateral /w IOL  . Tonsillectomy      as a teenager   . Tonsilectomy, adenoidectomy, bilateral myringotomy and tubes      no myringotomy tubes   . Lumbar fusion  07/06/2011  . Spine surgery  March 2013    Allergies  Allergen Reactions  . Latex     REACTION: rash  . Sulfa Antibiotics Other (See Comments)    Cold sweat light headed   . Sulfamethoxazole     REACTION: disorientation  . Tiotropium Bromide  Other (See Comments)    Sore throat    Current Outpatient Prescriptions  Medication Sig Dispense Refill  . aspirin 81 MG tablet Take 81 mg by mouth daily.      . calcium-vitamin D (OSCAL WITH D 500-200) 500-200 MG-UNIT per tablet Take 1 tablet by mouth daily.        . carbamazepine (TEGRETOL) 200 MG tablet Take 200 mg by mouth daily.      . Cholecalciferol (VITAMIN D3) 1000 UNITS CAPS Take 1,000 Units by mouth daily.       Marland Kitchen omeprazole (PRILOSEC) 20 MG capsule Take 20 mg by mouth every morning.       . simvastatin (ZOCOR) 40 MG tablet TAKE 1 TABLET BY MOUTH DAILY AT BEDTIME  90 tablet  3  . valsartan-hydrochlorothiazide (DIOVAN-HCT) 160-25 MG per tablet 1 qd  90 tablet  3   No current facility-administered medications for this visit.    ROS: See HPI for pertinent positives and negatives.   Physical  Examination  Filed Vitals:   07/25/13 1038  BP: 144/72  Pulse: 59  Resp: 16   Filed Weights   07/25/13 1038  Weight: 170 lb (77.111 kg)   Body mass index is 31.09 kg/(m^2).  General: A&O x 3, WDWN, obese female. Gait: normal Eyes: PERRLA. Pulmonary: CTAB, without wheezes , rales or rhonchi. Cardiac: regular Rythm , without detected murmur.         Carotid Bruits Left Right   Negative Negative  Aorta is not palpable. Radial pulses: 2+ palpable and =.                           VASCULAR EXAM: Extremities without ischemic changes  without Gangrene; without open wounds.                                                                                                          LE Pulses LEFT RIGHT       FEMORAL  not palpable  not palpable        POPLITEAL  not palpable   not palpable       POSTERIOR TIBIAL  not palpable   not palpable        DORSALIS PEDIS      ANTERIOR TIBIAL  palpable   palpable    Abdomen: soft, NT, no masses. Skin: no rashes, no ulcers noted. Musculoskeletal: no muscle wasting or atrophy.  Neurologic: A&O X 3; Appropriate Affect ; SENSATION: normal; MOTOR FUNCTION:  moving all extremities equally, motor strength 5/5 throughout. Speech is fluent/normal. CN 2-12 intact.    Non-Invasive Vascular Imaging: DATE: 07/25/2013 LOWER EXTREMITY ARTERIAL DUPLEX EVALUATION    INDICATION: Peripheral vascular disease    PREVIOUS INTERVENTION(S):     DUPLEX EXAM:     RIGHT  LEFT   Peak Systolic Velocity (cm/s) Ratio (if abnormal) Waveform  Peak Systolic Velocity (cm/s) Ratio (if abnormal) Waveform  149  T  Inflow Artery 195  T   333 2.23 T Proximal Anastomosis 145  B  73  B Proximal Graft 93  B  53  B Mid Graft 57  B  46  B  Distal Graft 60  B           228 4.95 B Distal Anastomosis 104  B  97  B Outflow Artery 141  B  0.77 Today's ABI / TBI 1.00  0.95 Previous ABI / TBI (06/21/2012  ) 1.02    Waveform:    M - Monophasic       B - Biphasic       T -  Triphasic  If Ankle Brachial Index (ABI) or Toe Brachial Index (TBI) performed, please see complete report     ADDITIONAL FINDINGS:     IMPRESSION: Patent right femoral-below knee popliteal artery and left femoral -above knee popliteal artery bypass grafts. Elevated velocities present involving the right proximal and distal anastomoses suggesting a 50-70% stenosis.    Compared to the previous exam:  Declined right ankle brachial index.     ASSESSMENT: [name] [name] is a 80 y.o. female who is status post bilateral femoropopliteal bypass with [name] [phone] and 2009), she's also had thrombolysis angioplasty of the right femoropopliteal bypass in 2010 and thrombectomy angioplasty right interpositional graft from above the knee to below the knee popliteal artery in 2010. Patent right femoral-below knee popliteal artery and left femoral -above knee popliteal artery bypass grafts. Elevated velocities present involving the right proximal and distal anastomoses suggesting a 50-70% stenosis. Declined right ankle brachial index. She has no claudication symptoms.  PLAN:  Since walking aggravates her low back arthritis, she will go to the gym she has access to and exercise on a stationary bike, goal of 30 minutes/day, 5 days/week. I discussed in depth with the patient the nature of atherosclerosis, and emphasized the importance of maximal medical management including strict control of blood pressure, blood glucose, and lipid levels, obtaining regular exercise, and continued cessation of smoking.  The patient is aware that without maximal medical management the underlying atherosclerotic disease process will progress, limiting the benefit of any interventions.  Based on the patient's vascular studies and examination, and after discussing with Dr. Oneida Alar, pt will return to clinic in 3 months for ABI's and bilateral LE arterial Duplex.  The patient was given information about PAD including signs,  symptoms, treatment, what symptoms should prompt the patient to seek immediate medical care, and risk reduction measures to take.  Clemon Chambers, RN, MSN, FNP-C [facility] Phone: [phone]  Clinic MD: [facility]  07/25/2013 10:34 AM

## 2013-07-25 NOTE — Patient Instructions (Signed)
Peripheral Vascular Disease Peripheral Vascular Disease (PVD), also called Peripheral Arterial Disease (PAD), is a circulation problem caused by cholesterol (atherosclerotic plaque) deposits in the arteries. PVD commonly occurs in the lower extremities (legs) but it can occur in other areas of the body, such as your arms. The cholesterol buildup in the arteries reduces blood flow which can cause pain and other serious problems. The presence of PVD can place a person at risk for Coronary Artery Disease (CAD).  CAUSES  Causes of PVD can be many. It is usually associated with more than one risk factor such as:   High Cholesterol.  Smoking.  Diabetes.  Lack of exercise or inactivity.  High blood pressure (hypertension).  Obesity.  Family history. SYMPTOMS   When the lower extremities are affected, patients with PVD may experience:  Leg pain with exertion or physical activity. This is called INTERMITTENT CLAUDICATION. This may present as cramping or numbness with physical activity. The location of the pain is associated with the level of blockage. For example, blockage at the abdominal level (distal abdominal aorta) may result in buttock or hip pain. Lower leg arterial blockage may result in calf pain.  As PVD becomes more severe, pain can develop with less physical activity.  In people with severe PVD, leg pain may occur at rest.  Other PVD signs and symptoms:  Leg numbness or weakness.  Coldness in the affected leg or foot, especially when compared to the other leg.  A change in leg color.  Patients with significant PVD are more prone to ulcers or sores on toes, feet or legs. These may take longer to heal or may reoccur. The ulcers or sores can become infected.  If signs and symptoms of PVD are ignored, gangrene may occur. This can result in the loss of toes or loss of an entire limb.  Not all leg pain is related to PVD. Other medical conditions can cause leg pain such  as:  Blood clots (embolism) or Deep Vein Thrombosis.  Inflammation of the blood vessels (vasculitis).  Spinal stenosis. DIAGNOSIS  Diagnosis of PVD can involve several different types of tests. These can include:  Pulse Volume Recording Method (PVR). This test is simple, painless and does not involve the use of X-rays. PVR involves measuring and comparing the blood pressure in the arms and legs. An ABI (Ankle-Brachial Index) is calculated. The normal ratio of blood pressures is 1. As this number becomes smaller, it indicates more severe disease.  < 0.95  indicates significant narrowing in one or more leg vessels.  <0.8 there will usually be pain in the foot, leg or buttock with exercise.  <0.4 will usually have pain in the legs at rest.  <0.25  usually indicates limb threatening PVD.  Doppler detection of pulses in the legs. This test is painless and checks to see if you have a pulses in your legs/feet.  A dye or contrast material (a substance that highlights the blood vessels so they show up on x-ray) may be given to help your caregiver better see the arteries for the following tests. The dye is eliminated from your body by the kidney's. Your caregiver may order blood work to check your kidney function and other laboratory values before the following tests are performed:  Magnetic Resonance Angiography (MRA). An MRA is a picture study of the blood vessels and arteries. The MRA machine uses a large magnet to produce images of the blood vessels.  Computed Tomography Angiography (CTA). A CTA is a   specialized x-ray that looks at how the blood flows in your blood vessels. An IV may be inserted into your arm so contrast dye can be injected.  Angiogram. Is a procedure that uses x-rays to look at your blood vessels. This procedure is minimally invasive, meaning a small incision (cut) is made in your groin. A small tube (catheter) is then inserted into the artery of your groin. The catheter is  guided to the blood vessel or artery your caregiver wants to examine. Contrast dye is injected into the catheter. X-rays are then taken of the blood vessel or artery. After the images are obtained, the catheter is taken out. TREATMENT  Treatment of PVD involves many interventions which may include:  Lifestyle changes:  Quitting smoking.  Exercise.  Following a low fat, low cholesterol diet.  Control of diabetes.  Foot care is very important to the PVD patient. Good foot care can help prevent infection.  Medication:  Cholesterol-lowering medicine.  Blood pressure medicine.  Anti-platelet drugs.  Certain medicines may reduce symptoms of Intermittent Claudication.  Interventional/Surgical options:  Angioplasty. An Angioplasty is a procedure that inflates a balloon in the blocked artery. This opens the blocked artery to improve blood flow.  Stent Implant. A wire mesh tube (stent) is placed in the artery. The stent expands and stays in place, allowing the artery to remain open.  Peripheral Bypass Surgery. This is a surgical procedure that reroutes the blood around a blocked artery to help improve blood flow. This type of procedure may be performed if Angioplasty or stent implants are not an option. SEEK IMMEDIATE MEDICAL CARE IF:   You develop pain or numbness in your arms or legs.  Your arm or leg turns cold, becomes blue in color.  You develop redness, warmth, swelling and pain in your arms or legs. MAKE SURE YOU:   Understand these instructions.  Will watch your condition.  Will get help right away if you are not doing well or get worse. Document Released: 05/19/2004 Document Revised: 07/04/2011 Document Reviewed: 04/15/2008 [employer] Patient Information 2014 [employer].  

## 2013-07-25 NOTE — Addendum Note (Signed)
Addended by: Dorthula Rue L on: 07/25/2013 01:35 PM   Modules accepted: Orders

## 2013-08-02 ENCOUNTER — Ambulatory Visit (INDEPENDENT_AMBULATORY_CARE_PROVIDER_SITE_OTHER): Payer: Medicare Other

## 2013-08-02 DIAGNOSIS — J309 Allergic rhinitis, unspecified: Secondary | ICD-10-CM | POA: Diagnosis not present

## 2013-08-09 ENCOUNTER — Ambulatory Visit (INDEPENDENT_AMBULATORY_CARE_PROVIDER_SITE_OTHER): Payer: Medicare Other

## 2013-08-09 DIAGNOSIS — J309 Allergic rhinitis, unspecified: Secondary | ICD-10-CM | POA: Diagnosis not present

## 2013-08-16 ENCOUNTER — Ambulatory Visit (INDEPENDENT_AMBULATORY_CARE_PROVIDER_SITE_OTHER): Payer: Medicare Other

## 2013-08-16 DIAGNOSIS — J309 Allergic rhinitis, unspecified: Secondary | ICD-10-CM | POA: Diagnosis not present

## 2013-08-20 DIAGNOSIS — H40059 Ocular hypertension, unspecified eye: Secondary | ICD-10-CM | POA: Diagnosis not present

## 2013-08-20 DIAGNOSIS — Z961 Presence of intraocular lens: Secondary | ICD-10-CM | POA: Diagnosis not present

## 2013-08-20 DIAGNOSIS — H04419 Chronic dacryocystitis of unspecified lacrimal passage: Secondary | ICD-10-CM | POA: Diagnosis not present

## 2013-08-20 DIAGNOSIS — H1045 Other chronic allergic conjunctivitis: Secondary | ICD-10-CM | POA: Diagnosis not present

## 2013-08-20 DIAGNOSIS — H02839 Dermatochalasis of unspecified eye, unspecified eyelid: Secondary | ICD-10-CM | POA: Diagnosis not present

## 2013-08-20 DIAGNOSIS — H04129 Dry eye syndrome of unspecified lacrimal gland: Secondary | ICD-10-CM | POA: Diagnosis not present

## 2013-08-23 ENCOUNTER — Ambulatory Visit (INDEPENDENT_AMBULATORY_CARE_PROVIDER_SITE_OTHER): Payer: Medicare Other

## 2013-08-23 DIAGNOSIS — J309 Allergic rhinitis, unspecified: Secondary | ICD-10-CM | POA: Diagnosis not present

## 2013-08-26 DIAGNOSIS — L723 Sebaceous cyst: Secondary | ICD-10-CM | POA: Diagnosis not present

## 2013-08-27 ENCOUNTER — Other Ambulatory Visit: Payer: Self-pay | Admitting: Neurology

## 2013-08-30 ENCOUNTER — Ambulatory Visit (INDEPENDENT_AMBULATORY_CARE_PROVIDER_SITE_OTHER): Payer: Medicare Other

## 2013-08-30 DIAGNOSIS — J309 Allergic rhinitis, unspecified: Secondary | ICD-10-CM | POA: Diagnosis not present

## 2013-09-06 ENCOUNTER — Ambulatory Visit (INDEPENDENT_AMBULATORY_CARE_PROVIDER_SITE_OTHER): Payer: Medicare Other

## 2013-09-06 DIAGNOSIS — J309 Allergic rhinitis, unspecified: Secondary | ICD-10-CM | POA: Diagnosis not present

## 2013-09-13 ENCOUNTER — Ambulatory Visit (INDEPENDENT_AMBULATORY_CARE_PROVIDER_SITE_OTHER): Payer: Medicare Other

## 2013-09-13 DIAGNOSIS — J309 Allergic rhinitis, unspecified: Secondary | ICD-10-CM

## 2013-09-20 ENCOUNTER — Ambulatory Visit (INDEPENDENT_AMBULATORY_CARE_PROVIDER_SITE_OTHER): Payer: Medicare Other

## 2013-09-20 DIAGNOSIS — J309 Allergic rhinitis, unspecified: Secondary | ICD-10-CM | POA: Diagnosis not present

## 2013-09-25 ENCOUNTER — Encounter: Payer: Self-pay | Admitting: Internal Medicine

## 2013-09-27 ENCOUNTER — Ambulatory Visit: Payer: Self-pay

## 2013-10-04 ENCOUNTER — Ambulatory Visit (INDEPENDENT_AMBULATORY_CARE_PROVIDER_SITE_OTHER): Payer: Medicare Other

## 2013-10-04 DIAGNOSIS — J309 Allergic rhinitis, unspecified: Secondary | ICD-10-CM

## 2013-10-11 ENCOUNTER — Ambulatory Visit (INDEPENDENT_AMBULATORY_CARE_PROVIDER_SITE_OTHER): Payer: Medicare Other

## 2013-10-11 DIAGNOSIS — J309 Allergic rhinitis, unspecified: Secondary | ICD-10-CM

## 2013-10-12 ENCOUNTER — Inpatient Hospital Stay (HOSPITAL_COMMUNITY)
Admission: EM | Admit: 2013-10-12 | Discharge: 2013-10-17 | DRG: 316 | Disposition: A | Discharge status: Home Health | Payer: Medicare Other | Attending: Vascular Surgery | Admitting: Vascular Surgery

## 2013-10-12 ENCOUNTER — Encounter (HOSPITAL_COMMUNITY): Payer: Self-pay | Admitting: Emergency Medicine

## 2013-10-12 DIAGNOSIS — Z8249 Family history of ischemic heart disease and other diseases of the circulatory system: Secondary | ICD-10-CM

## 2013-10-12 DIAGNOSIS — Z7982 Long term (current) use of aspirin: Secondary | ICD-10-CM

## 2013-10-12 DIAGNOSIS — Y832 Surgical operation with anastomosis, bypass or graft as the cause of abnormal reaction of the patient, or of later complication, without mention of misadventure at the time of the procedure: Secondary | ICD-10-CM | POA: Diagnosis present

## 2013-10-12 DIAGNOSIS — E785 Hyperlipidemia, unspecified: Secondary | ICD-10-CM | POA: Diagnosis present

## 2013-10-12 DIAGNOSIS — Z87891 Personal history of nicotine dependence: Secondary | ICD-10-CM

## 2013-10-12 DIAGNOSIS — K219 Gastro-esophageal reflux disease without esophagitis: Secondary | ICD-10-CM | POA: Diagnosis present

## 2013-10-12 DIAGNOSIS — G500 Trigeminal neuralgia: Secondary | ICD-10-CM | POA: Diagnosis present

## 2013-10-12 DIAGNOSIS — T82898A Other specified complication of vascular prosthetic devices, implants and grafts, initial encounter: Secondary | ICD-10-CM | POA: Diagnosis present

## 2013-10-12 DIAGNOSIS — J449 Chronic obstructive pulmonary disease, unspecified: Secondary | ICD-10-CM | POA: Diagnosis present

## 2013-10-12 DIAGNOSIS — M79604 Pain in right leg: Secondary | ICD-10-CM

## 2013-10-12 DIAGNOSIS — J4489 Other specified chronic obstructive pulmonary disease: Secondary | ICD-10-CM | POA: Diagnosis present

## 2013-10-12 DIAGNOSIS — Z86718 Personal history of other venous thrombosis and embolism: Secondary | ICD-10-CM | POA: Diagnosis not present

## 2013-10-12 DIAGNOSIS — I10 Essential (primary) hypertension: Secondary | ICD-10-CM | POA: Diagnosis present

## 2013-10-12 DIAGNOSIS — M79609 Pain in unspecified limb: Secondary | ICD-10-CM | POA: Diagnosis not present

## 2013-10-12 DIAGNOSIS — Z981 Arthrodesis status: Secondary | ICD-10-CM | POA: Diagnosis not present

## 2013-10-12 DIAGNOSIS — I70219 Atherosclerosis of native arteries of extremities with intermittent claudication, unspecified extremity: Secondary | ICD-10-CM | POA: Diagnosis not present

## 2013-10-12 DIAGNOSIS — F411 Generalized anxiety disorder: Secondary | ICD-10-CM | POA: Diagnosis present

## 2013-10-12 DIAGNOSIS — I739 Peripheral vascular disease, unspecified: Secondary | ICD-10-CM | POA: Diagnosis present

## 2013-10-12 LAB — BASIC METABOLIC PANEL
BUN: 18 mg/dL (ref 6–23)
CO2: 27 meq/L (ref 19–32)
CREATININE: 0.96 mg/dL (ref 0.50–1.10)
Calcium: 10 mg/dL (ref 8.4–10.5)
Chloride: 98 mEq/L (ref 96–112)
GFR calc Af Amer: 60 mL/min — ABNORMAL LOW (ref >90)
GFR calc non Af Amer: 52 mL/min — ABNORMAL LOW (ref >90)
Glucose, Bld: 106 mg/dL — ABNORMAL HIGH (ref 70–99)
Potassium: 4.5 mEq/L (ref 3.7–5.3)
Sodium: 138 mEq/L (ref 137–147)

## 2013-10-12 LAB — URINALYSIS, ROUTINE W REFLEX MICROSCOPIC
Bilirubin Urine: NEGATIVE
Glucose, UA: NEGATIVE mg/dL
Hgb urine dipstick: NEGATIVE
Ketones, ur: NEGATIVE mg/dL
NITRITE: NEGATIVE
PH: 5.5 (ref 5.0–8.0)
Protein, ur: NEGATIVE mg/dL
Specific Gravity, Urine: 1.017 (ref 1.005–1.030)
Urobilinogen, UA: 0.2 mg/dL (ref 0.0–1.0)

## 2013-10-12 LAB — PROTIME-INR
INR: 1 (ref 0.00–1.49)
Prothrombin Time: 13 seconds (ref 11.6–15.2)

## 2013-10-12 LAB — URINE MICROSCOPIC-ADD ON

## 2013-10-12 LAB — CBC
HEMATOCRIT: 37.3 % (ref 36.0–46.0)
HEMOGLOBIN: 12.8 g/dL (ref 12.0–15.0)
MCH: 29 pg (ref 26.0–34.0)
MCHC: 34.3 g/dL (ref 30.0–36.0)
MCV: 84.6 fL (ref 78.0–100.0)
Platelets: 175 10*3/uL (ref 150–400)
RBC: 4.41 MIL/uL (ref 3.87–5.11)
RDW: 13.1 % (ref 11.5–15.5)
WBC: 8.8 10*3/uL (ref 4.0–10.5)

## 2013-10-12 LAB — APTT: APTT: 29 s (ref 24–37)

## 2013-10-12 MED ORDER — METOPROLOL TARTRATE 1 MG/ML IV SOLN: 2.0000 mg | INTRAVENOUS | Status: DC | PRN

## 2013-10-12 MED ORDER — PANTOPRAZOLE SODIUM 40 MG PO TBEC
40.0000 mg | DELAYED_RELEASE_TABLET | Freq: Every day | ORAL | Status: DC
  Administered 2013-10-12 – 2013-10-15 (×3): 40 mg via ORAL
  Filled 2013-10-12 (×3): qty 1

## 2013-10-12 MED ORDER — HEPARIN (PORCINE) IN NACL 100-0.45 UNIT/ML-% IJ SOLN
900.0000 [IU]/h | INTRAMUSCULAR | Status: DC
  Administered 2013-10-12: 900 [IU]/h via INTRAVENOUS
  Filled 2013-10-12: qty 250

## 2013-10-12 MED ORDER — LABETALOL HCL 5 MG/ML IV SOLN
10.0000 mg | INTRAVENOUS | Status: DC | PRN
  Filled 2013-10-12: qty 4

## 2013-10-12 MED ORDER — GUAIFENESIN-DM 100-10 MG/5ML PO SYRP: 15.0000 mL | ORAL_SOLUTION | ORAL | Status: DC | PRN

## 2013-10-12 MED ORDER — OXYCODONE-ACETAMINOPHEN 5-325 MG PO TABS
1.0000 | ORAL_TABLET | ORAL | Status: DC | PRN
  Administered 2013-10-13: 1 via ORAL
  Filled 2013-10-12: qty 1

## 2013-10-12 MED ORDER — PHENOL 1.4 % MT LIQD
1.0000 | OROMUCOSAL | Status: DC | PRN
  Filled 2013-10-12: qty 177

## 2013-10-12 MED ORDER — HEPARIN BOLUS VIA INFUSION
4000.0000 [IU] | Freq: Once | INTRAVENOUS | Status: AC
  Administered 2013-10-12: 4000 [IU] via INTRAVENOUS

## 2013-10-12 MED ORDER — POTASSIUM CHLORIDE CRYS ER 20 MEQ PO TBCR: 20.0000 meq | EXTENDED_RELEASE_TABLET | Freq: Once | ORAL | Status: DC

## 2013-10-12 MED ORDER — ALUM & MAG HYDROXIDE-SIMETH 200-200-20 MG/5ML PO SUSP: 15.0000 mL | ORAL | Status: DC | PRN

## 2013-10-12 MED ORDER — KCL IN DEXTROSE-NACL 20-5-0.45 MEQ/L-%-% IV SOLN
INTRAVENOUS | Status: DC
  Administered 2013-10-12 – 2013-10-17 (×4): via INTRAVENOUS
  Filled 2013-10-12 (×10): qty 1000

## 2013-10-12 MED ORDER — HYDRALAZINE HCL 20 MG/ML IJ SOLN: 10.0000 mg | INTRAMUSCULAR | Status: DC | PRN

## 2013-10-12 MED ORDER — ONDANSETRON HCL 4 MG/2ML IJ SOLN: 4.0000 mg | Freq: Four times a day (QID) | INTRAMUSCULAR | Status: DC | PRN

## 2013-10-12 NOTE — ED Notes (Signed)
Dr. Early at bedside 

## 2013-10-12 NOTE — H&P (Signed)
Patient name: [name] [name] MRN: [id number] DOB: 02-Apr-1925 Sex: female     Reason for referral:  Chief Complaint  Patient presents with  . Leg Pain    HISTORY OF PRESENT ILLNESS: The patient is a very pleasant 80 year old white female who called this afternoon reporting change in sensation in her right foot. She has a very extensive past vascular surgery history. She underwent prior staged bilateral femoral-popliteal bypasses with [name] in the 1990s in mid 2000. She has had several occlusions of her right femoral-popliteal bypass. She underwent lysis of her right pop bypass in 2010 with subsequent failure after approximately 6 months and then revision by Dr. Oneida Alar. She has been followed in our office with patent bilateral grafts. Most recently she was seen in April of 2015. At that time she did have slight reduction in her ankle arm index bilaterally but patent grafts. She reports 20 1 PM she had sensation of tightness in her right leg which was new for her. In the past when she's had occlusion of her graft she has had the severe ischemia with numbness. I was concerned regarding a potential for occlusion and acid she presented to the emergency room for further evaluation  Past Medical History  Diagnosis Date  . GERD (gastroesophageal reflux disease)   . Hyperlipidemia   . History of colonic polyps   . Diverticulosis   . Shingles   . Eczema   . Allergic rhinoconjunctivitis   . Lung nodule 2011  . Environmental allergies     allergy shot- q friday in Dr. Janee Morn office. PFT's abnormal- recommended Spiriva to use preop & will d/c after surgery  . Arthritis     low back , stenosis  . Anxiety     pt. managed- uses deep breathing   . Stenosis of popliteal artery     blood clots in legs long ago    . COPD (chronic obstructive pulmonary disease)   . H/O hiatal hernia   . Hypertension   . Trigeminal neuralgia   . Peripheral vascular disease   . DVT (deep venous  thrombosis)     Past Surgical History  Procedure Laterality Date  . Tubal ligation    . Cholecystectomy  1998  . Femoral-popliteal bypass graft         x2 surgeries 1990's & 2009  . Eye surgery      cataracts removed- bilateral /w IOL  . Tonsillectomy      as a teenager   . Tonsilectomy, adenoidectomy, bilateral myringotomy and tubes      no myringotomy tubes   . Lumbar fusion  07/06/2011  . Spine surgery  March 2013    History   Social History  . Marital Status: Single    Spouse Name: N/A    Number of Children: N/A  . Years of Education: N/A   Occupational History  . Not on file.   Social History Main Topics  . Smoking status: Former Smoker -- 2.00 packs/day for 30 years    Types: Cigarettes    Quit date: 06/18/1993  . Smokeless tobacco: Never Used     Comment: QUIT IN 1995  . Alcohol Use: No  . Drug Use: No  . Sexual Activity: Not on file   Other Topics Concern  . Not on file   Social History Narrative   ** Merged History Encounter **        Family History  Problem Relation Age of Onset  .  Arthritis Mother   . Hypertension Mother   . Heart disease Father   . Colon polyps Father   . Breast cancer Sister   . Lung cancer Sister   . Colon cancer Sister   . Cancer Sister     Breast  . Lung cancer Brother   . Cancer Brother     Lung  . Anesthesia problems Neg Hx   . Hypotension Neg Hx   . Malignant hyperthermia Neg Hx   . Pseudochol deficiency Neg Hx   . Cancer Daughter     Breast    Allergies as of 10/12/2013 - Review Complete 10/12/2013  Allergen Reaction Noted  . Sulfa antibiotics Other (See Comments) 12/05/2011  . Tiotropium bromide Shortness Of Breath and Other (See Comments) 06/21/2011  . Latex Itching and Rash     No current facility-administered medications on file prior to encounter.   Current Outpatient Prescriptions on File Prior to Encounter  Medication Sig Dispense Refill  . aspirin 81 MG tablet Take 81 mg by mouth daily.        . calcium-vitamin D (OSCAL WITH D 500-200) 500-200 MG-UNIT per tablet Take 1 tablet by mouth daily.        . Cholecalciferol (VITAMIN D3) 1000 UNITS CAPS Take 1,000 Units by mouth daily.       . Naproxen Sodium (ALEVE PO) Take 220 mg by mouth daily as needed (for arthiritis). Back pain      . omeprazole (PRILOSEC) 20 MG capsule Take 20 mg by mouth every morning.          REVIEW OF SYSTEMS:  Negative except for as noted in her past medical history PHYSICAL EXAMINATION:  General: The patient is a well-nourished female, in no acute distress. Vital signs are BP 174/71  Pulse 69  Temp(Src) 98.3 F (36.8 C) (Oral)  Resp 20  Ht 5\' 2"  (1.575 m)  Wt 173 lb (78.472 kg)  BMI 31.63 kg/m2  SpO2 97% Pulmonary: There is a good air exchange Abdomen: Soft and non-tender. Musculoskeletal: There are no major deformities.  There is no significant extremity pain. Neurologic: No focal weakness or paresthesias are detected, Skin: There are no ulcer or rashes noted. Psychiatric: The patient has normal affect. Cardiovascular: Palpable femoral pulses bilaterally, 1-2+ left dorsalis pedis pulses with absent right pedal pulses. Both feet are warm and motor and sensory is intact She does have dorsalis pedis and posterior tibial Doppler flow in her right foot but this is dampened compared to the left    Impression and Plan:  Change in status of her right femoral popliteal bypass with possible occlusion or high-grade stenosis. She is not profoundly ischemic. I did explain that since it is Saturday interventional radiology would be involved for evaluation and treatment. I did discuss the case with interventional radiology and plan for heparin this evening with diagnostic imaging tomorrow in interventional radiology. I discussed options of lysis versus bypass revision versus observation depending on the findings of the arteriogram. Discussed at length with the patient and her son present. Will be admitted for  anticoagulation and imaging in the morning    EARLY, TODD [facility]: [phone]

## 2013-10-12 NOTE — Progress Notes (Signed)
ANTICOAGULATION CONSULT NOTE - Initial Consult  Pharmacy Consult for heparin Indication: Right leg ischemia   Allergies  Allergen Reactions  . Sulfa Antibiotics Other (See Comments)    Cold sweat light headed and disorientation  . Tiotropium Bromide Shortness Of Breath and Other (See Comments)    Sore throat also  . Latex Itching and Rash    Patient Measurements: Height: 5\' 2"  (157.5 cm) Weight: 173 lb (78.472 kg) IBW/kg (Calculated) : 50.1 Heparin Dosing Weight: 67 kg  Vital Signs: Temp: 98.3 F (36.8 C) (06/20 1704) Temp src: Oral (06/20 1704) BP: 174/71 mmHg (06/20 1704) Pulse Rate: 69 (06/20 1704)  Labs:  Recent Labs  10/12/13 1750  HGB 12.8  HCT 37.3  PLT 175  APTT 29  LABPROT 13.0  INR 1.00  CREATININE 0.96    Estimated Creatinine Clearance: 40.8 ml/min (by C-G formula based on Cr of 0.96).   Medical History: Past Medical History  Diagnosis Date  . GERD (gastroesophageal reflux disease)   . Hyperlipidemia   . History of colonic polyps   . Diverticulosis   . Shingles   . Eczema   . Allergic rhinoconjunctivitis   . Lung nodule 2011  . Environmental allergies     allergy shot- q friday in Dr. Janee Morn office. PFT's abnormal- recommended Spiriva to use preop & will d/c after surgery  . Arthritis     low back , stenosis  . Anxiety     pt. managed- uses deep breathing   . Stenosis of popliteal artery     blood clots in legs long ago    . COPD (chronic obstructive pulmonary disease)   . H/O hiatal hernia   . Hypertension   . Trigeminal neuralgia   . Peripheral vascular disease   . DVT (deep venous thrombosis)     Medications:   see med rec  Assessment: Patient is an 80 y.o F with hx bilateral fem-pop bypasses.  She c/o of right leg numbness today and now admitted to [facility] for further evaluation.  To start heparin for suspected ischemic leg with plan for IR diagnostic imaging on 6/21.  Goal of Therapy:  Heparin level 0.3-0.7 units/ml Monitor  platelets by anticoagulation protocol: Yes   Plan:  1) heparin 4000 units IV x1 bolus, then 900 units/hr 2) check 8 hr heparin level  Pham, Anh P 10/12/2013,7:10 PM

## 2013-10-12 NOTE — ED Notes (Signed)
Report to [facility] on 2W. Heparin not yet arrived from pharmacy.  Will send to 2W upon arrival in ED as pharmacy has sent two min ago.

## 2013-10-12 NOTE — ED Notes (Signed)
Dr. Donnetta Hutching to see pt in the ED

## 2013-10-12 NOTE — ED Notes (Signed)
Rt. Leg pain began around 13:00.  Denies any injury. Pain is located in the calf. No swelling or warmth or redness.   Hx of blood clots in both legs.  Pt. Has had surgery in the rt. Leg.  Unable to stand on the leg for long periods of time.  Difficulty walking.

## 2013-10-12 NOTE — ED Provider Notes (Signed)
CSN: [id number]     Arrival date & time 10/12/13  1700 History   First MD Initiated Contact with Patient 10/12/13 1714     Chief Complaint  Patient presents with  . Leg Pain     (Consider location/radiation/quality/duration/timing/severity/associated sxs/prior Treatment) Patient is a 80 y.o. female presenting with leg pain. The history is provided by the patient.  Leg Pain Location:  Leg Injury: no   Leg location:  R lower leg Pain details:    Quality:  Aching   Radiates to:  Does not radiate   Severity:  Moderate   Onset quality:  Sudden   Timing:  Constant   Progression:  Unchanged Chronicity:  Recurrent Dislocation: no   Relieved by:  Nothing Worsened by:  Nothing tried Associated symptoms: no fever     Past Medical History  Diagnosis Date  . GERD (gastroesophageal reflux disease)   . Hyperlipidemia   . History of colonic polyps   . Diverticulosis   . Shingles   . Eczema   . Allergic rhinoconjunctivitis   . Lung nodule 2011  . Environmental allergies     allergy shot- q friday in Dr. Janee Morn office. PFT's abnormal- recommended Spiriva to use preop & will d/c after surgery  . Arthritis     low back , stenosis  . Anxiety     pt. managed- uses deep breathing   . Stenosis of popliteal artery     blood clots in legs long ago    . COPD (chronic obstructive pulmonary disease)   . H/O hiatal hernia   . Hypertension   . Trigeminal neuralgia   . Peripheral vascular disease   . DVT (deep venous thrombosis)    Past Surgical History  Procedure Laterality Date  . Tubal ligation    . Cholecystectomy  1998  . Femoral-popliteal bypass graft         x2 surgeries 1990's & 2009  . Eye surgery      cataracts removed- bilateral /w IOL  . Tonsillectomy      as a teenager   . Tonsilectomy, adenoidectomy, bilateral myringotomy and tubes      no myringotomy tubes   . Lumbar fusion  07/06/2011  . Spine surgery  March 2013   Family History  Problem Relation Age of Onset   . Arthritis Mother   . Hypertension Mother   . Heart disease Father   . Colon polyps Father   . Breast cancer Sister   . Lung cancer Sister   . Colon cancer Sister   . Cancer Sister     Breast  . Lung cancer Brother   . Cancer Brother     Lung  . Anesthesia problems Neg Hx   . Hypotension Neg Hx   . Malignant hyperthermia Neg Hx   . Pseudochol deficiency Neg Hx   . Cancer Daughter     Breast   History  Substance Use Topics  . Smoking status: Former Smoker -- 2.00 packs/day for 30 years    Types: Cigarettes    Quit date: 06/18/1993  . Smokeless tobacco: Never Used     Comment: QUIT IN 1995  . Alcohol Use: No   OB History   Grav Para Term Preterm Abortions TAB SAB Ect Mult Living                 Review of Systems  Constitutional: Negative for fever.  Respiratory: Negative for cough and shortness of breath.   Gastrointestinal: Negative for  vomiting and abdominal pain.  All other systems reviewed and are negative.     Allergies  Latex; Sulfa antibiotics; Sulfamethoxazole; and Tiotropium bromide  Home Medications   Prior to Admission medications   Medication Sig Start Date End Date Taking? Authorizing Provider  aspirin 81 MG tablet Take 81 mg by mouth daily.    Historical Provider, MD  calcium-vitamin D (OSCAL WITH D 500-200) 500-200 MG-UNIT per tablet Take 1 tablet by mouth daily.      Historical Provider, MD  carbamazepine (TEGRETOL) 200 MG tablet TAKE 1 TABLET BY MOUTH THREE TIMES A DAY 08/27/13   Larey Seat, MD  Cholecalciferol (VITAMIN D3) 1000 UNITS CAPS Take 1,000 Units by mouth daily.     Historical Provider, MD  Naproxen Sodium (ALEVE PO) Take by mouth continuous as needed. Back pain    Historical Provider, MD  omeprazole (PRILOSEC) 20 MG capsule Take 20 mg by mouth every morning.     Historical Provider, MD  simvastatin (ZOCOR) 40 MG tablet TAKE 1 TABLET BY MOUTH DAILY AT BEDTIME 10/02/12   Ricard Dillon, MD  valsartan-hydrochlorothiazide (DIOVAN-HCT)  160-25 MG per tablet 1 qd 01/21/13   Ricard Dillon, MD   BP 174/71  Pulse 69  Temp(Src) 98.3 F (36.8 C) (Oral)  Resp 20  Ht 5\' 2"  (1.575 m)  Wt 173 lb (78.472 kg)  BMI 31.63 kg/m2  SpO2 97% Physical Exam  Nursing note and vitals reviewed. Constitutional: She is oriented to person, place, and time. She appears well-developed and well-nourished. No distress.  HENT:  Head: Normocephalic and atraumatic.  Eyes: EOM are normal. Pupils are equal, round, and reactive to light.  Neck: Normal range of motion. Neck supple.  Cardiovascular: Normal rate and regular rhythm.  Exam reveals no friction rub.   No murmur heard. Pulmonary/Chest: Effort normal and breath sounds normal. No respiratory distress. She has no wheezes. She has no rales.  Abdominal: Soft. She exhibits no distension. There is no tenderness. There is no rebound.  Musculoskeletal: Normal range of motion. She exhibits no edema.  R foot cool, mild pallor compared to L. No pulse appreciated in R foot. Capillary refill delayed in R leg  Neurological: She is alert and oriented to person, place, and time.  Skin: Skin is warm. She is not diaphoretic.    ED Course  Procedures (including critical care time) Labs Review Labs Reviewed  BASIC METABOLIC PANEL - Abnormal; Notable for the following:    Glucose, Bld 106 (*)    GFR calc non Af Amer 52 (*)    GFR calc Af Amer 60 (*)    All other components within normal limits  URINALYSIS, ROUTINE W REFLEX MICROSCOPIC - Abnormal; Notable for the following:    Leukocytes, UA MODERATE (*)    All other components within normal limits  CBC  PROTIME-INR  APTT  URINE MICROSCOPIC-ADD ON  HEPARIN LEVEL (UNFRACTIONATED)  CBC    Imaging Review No results found.   EKG Interpretation None      MDM   Final diagnoses:  Right leg pain    30F with hx of arterial emboli presents with findings concerning for R leg arterial clot. Hx of clots in both legs. Multiple vascular surgeries  including angioplasties of Fem-Pop bypass grafts.  Acute onset of R leg pain when getting up from sitting. Did not hear a pop. No decreased ROM or difficulty with moving her R leg. Pain diffusely in posterior R leg.  Patient spoke with Dr. Donnetta Hutching  with Vascular Surgery, who recommended coming to the ED. Here, well-appearing, appears much younger than stated age. No palpable pulse appreciated in the R foot. Mild pallor to R leg. No pulse appreciated when using the Doppler. R leg is cold and has decreased capillary refill. Labs drawn, heparin ordered. Vascular surgery consulted - concern for arterial embolization of her R leg. Has normal function, sensation, strength. Dr. Donnetta Hutching evaluated patient, will hold off on heparin at this time, will admit for further eval.   Osvaldo Shipper, MD 10/13/13 [phone]

## 2013-10-12 NOTE — ED Notes (Signed)
attempted report 

## 2013-10-12 NOTE — ED Notes (Signed)
MD at bedside. 

## 2013-10-13 ENCOUNTER — Encounter (HOSPITAL_COMMUNITY): Payer: Self-pay | Admitting: Radiology

## 2013-10-13 ENCOUNTER — Inpatient Hospital Stay (HOSPITAL_COMMUNITY): Payer: Medicare Other

## 2013-10-13 DIAGNOSIS — I70219 Atherosclerosis of native arteries of extremities with intermittent claudication, unspecified extremity: Secondary | ICD-10-CM

## 2013-10-13 LAB — CBC
HCT: 35.2 % — ABNORMAL LOW (ref 36.0–46.0)
HEMATOCRIT: 34.4 % — AB (ref 36.0–46.0)
HEMATOCRIT: 34.6 % — AB (ref 36.0–46.0)
HEMOGLOBIN: 11.3 g/dL — AB (ref 12.0–15.0)
Hemoglobin: 11.7 g/dL — ABNORMAL LOW (ref 12.0–15.0)
Hemoglobin: 11.3 g/dL — ABNORMAL LOW (ref 12.0–15.0)
MCH: 28.4 pg (ref 26.0–34.0)
MCH: 28 pg (ref 26.0–34.0)
MCH: 28.1 pg (ref 26.0–34.0)
MCHC: 33.2 g/dL (ref 30.0–36.0)
MCHC: 32.8 g/dL (ref 30.0–36.0)
MCHC: 32.7 g/dL (ref 30.0–36.0)
MCV: 85.4 fL (ref 78.0–100.0)
MCV: 85.4 fL (ref 78.0–100.0)
MCV: 86.1 fL (ref 78.0–100.0)
PLATELETS: 153 10*3/uL (ref 150–400)
PLATELETS: 152 10*3/uL (ref 150–400)
Platelets: 147 10*3/uL — ABNORMAL LOW (ref 150–400)
RBC: 4.12 MIL/uL (ref 3.87–5.11)
RBC: 4.03 MIL/uL (ref 3.87–5.11)
RBC: 4.02 MIL/uL (ref 3.87–5.11)
RDW: 13.3 % (ref 11.5–15.5)
RDW: 13.2 % (ref 11.5–15.5)
RDW: 13.4 % (ref 11.5–15.5)
WBC: 7.6 10*3/uL (ref 4.0–10.5)
WBC: 8.5 10*3/uL (ref 4.0–10.5)
WBC: 8.3 10*3/uL (ref 4.0–10.5)

## 2013-10-13 LAB — FIBRINOGEN
Fibrinogen: 358 mg/dL (ref 204–475)
Fibrinogen: 784 mg/dL — ABNORMAL HIGH (ref 204–475)

## 2013-10-13 LAB — MRSA PCR SCREENING: MRSA BY PCR: NEGATIVE

## 2013-10-13 LAB — HEPARIN LEVEL (UNFRACTIONATED)
Heparin Unfractionated: 0.59 IU/mL (ref 0.30–0.70)
Heparin Unfractionated: 0.22 IU/mL — ABNORMAL LOW (ref 0.30–0.70)

## 2013-10-13 MED ORDER — MIDAZOLAM HCL 2 MG/2ML IJ SOLN
INTRAMUSCULAR | Status: DC | PRN
  Administered 2013-10-13 (×2): 2 mg via INTRAVENOUS

## 2013-10-13 MED ORDER — MORPHINE SULFATE 4 MG/ML IJ SOLN
5.0000 mg | INTRAMUSCULAR | Status: DC | PRN
  Administered 2013-10-13 – 2013-10-15 (×5): 5 mg via INTRAVENOUS
  Filled 2013-10-13 (×6): qty 2

## 2013-10-13 MED ORDER — FENTANYL CITRATE 0.05 MG/ML IJ SOLN
INTRAMUSCULAR | Status: AC | PRN
  Administered 2013-10-13: 50 ug via INTRAVENOUS
  Administered 2013-10-13: 12.5 ug via INTRAVENOUS
  Administered 2013-10-13: 50 ug via INTRAVENOUS
  Administered 2013-10-13 (×2): 12.5 ug via INTRAVENOUS

## 2013-10-13 MED ORDER — SODIUM CHLORIDE 0.9 % IJ SOLN: 3.0000 mL | INTRAMUSCULAR | Status: DC | PRN

## 2013-10-13 MED ORDER — MIDAZOLAM HCL 2 MG/2ML IJ SOLN
INTRAMUSCULAR | Status: AC
  Filled 2013-10-13: qty 4

## 2013-10-13 MED ORDER — TENECTEPLASE 50 MG IV KIT
0.2500 mg/h | PACK | INTRAVENOUS | Status: DC
  Administered 2013-10-14 (×2): 0.25 mg/h
  Filled 2013-10-13 (×4): qty 0.5

## 2013-10-13 MED ORDER — HEPARIN (PORCINE) IN NACL 100-0.45 UNIT/ML-% IJ SOLN
850.0000 [IU]/h | INTRAMUSCULAR | Status: DC
  Administered 2013-10-13: 850 [IU]/h via INTRAVENOUS
  Filled 2013-10-13 (×3): qty 250

## 2013-10-13 MED ORDER — SODIUM CHLORIDE 0.9 % IV SOLN: 250.0000 mL | INTRAVENOUS | Status: DC | PRN

## 2013-10-13 MED ORDER — IODIXANOL 320 MG/ML IV SOLN
100.0000 mL | Freq: Once | INTRAVENOUS | Status: AC | PRN
  Administered 2013-10-13: 80 mL via INTRAVENOUS

## 2013-10-13 MED ORDER — MIDAZOLAM HCL 2 MG/2ML IJ SOLN: 1.0000 mg | INTRAMUSCULAR | Status: DC | PRN

## 2013-10-13 MED ORDER — ONDANSETRON HCL 4 MG/2ML IJ SOLN
4.0000 mg | Freq: Four times a day (QID) | INTRAMUSCULAR | Status: DC | PRN
  Administered 2013-10-15: 4 mg via INTRAVENOUS
  Filled 2013-10-13: qty 2

## 2013-10-13 MED ORDER — SODIUM CHLORIDE 0.9 % IJ SOLN
3.0000 mL | Freq: Two times a day (BID) | INTRAMUSCULAR | Status: DC
Start: 2013-10-13 — End: 2013-10-17
  Administered 2013-10-13 – 2013-10-16 (×7): 3 mL via INTRAVENOUS

## 2013-10-13 MED ORDER — FENTANYL CITRATE 0.05 MG/ML IJ SOLN
INTRAMUSCULAR | Status: AC
  Filled 2013-10-13: qty 4

## 2013-10-13 NOTE — Progress Notes (Signed)
Pt denies pain in right leg at rest. She states pain from knee down when she is up walking to the bathroom, but it resolves when back in bed. Her right foot is slightly cooler than left. Right dorsalis pedis and posterior tibial pulses are present per doppler. R foot capillary refill < 3 seconds and sensation is intact. Pt instructed to notify nurse if she notes any changes.

## 2013-10-13 NOTE — Progress Notes (Signed)
ANTICOAGULATION CONSULT NOTE - Follow Up Consult  Pharmacy Consult for Heparin Indication: DVT, catheter directed lysis  Allergies  Allergen Reactions  . Sulfa Antibiotics Other (See Comments)    Cold sweat light headed and disorientation  . Tiotropium Bromide Shortness Of Breath and Other (See Comments)    Sore throat also  . Latex Itching and Rash    Patient Measurements: Height: 5\' 2"  (157.5 cm) Weight: 174 lb 6.1 oz (79.1 kg) IBW/kg (Calculated) : 50.1  Vital Signs: Temp: 98.2 F (36.8 C) (06/21 1528) Temp src: Oral (06/21 1528) BP: 125/40 mmHg (06/21 1800) Pulse Rate: 53 (06/21 1800)  Labs:  Recent Labs  10/12/13 1750 10/13/13 0350 10/13/13 1700  HGB 12.8 11.7* 11.3*  HCT 37.3 35.2* 34.4*  PLT 175 153 152  APTT 29  --   --   LABPROT 13.0  --   --   INR 1.00  --   --   HEPARINUNFRC  --  0.59 0.22*  CREATININE 0.96  --   --     Estimated Creatinine Clearance: 41 ml/min (by C-G formula based on Cr of 0.96).   Medications:  Prescriptions prior to admission  Medication Sig Dispense Refill  . aspirin 81 MG tablet Take 81 mg by mouth daily.      . calcium-vitamin D (OSCAL WITH D 500-200) 500-200 MG-UNIT per tablet Take 1 tablet by mouth daily.        . carbamazepine (TEGRETOL) 200 MG tablet Take 200 mg by mouth at bedtime.      . Cholecalciferol (VITAMIN D3) 1000 UNITS CAPS Take 1,000 Units by mouth daily.       . Naproxen Sodium (ALEVE PO) Take 220 mg by mouth daily as needed (for arthiritis). Back pain      . omeprazole (PRILOSEC) 20 MG capsule Take 20 mg by mouth every morning.       . simvastatin (ZOCOR) 40 MG tablet Take 40 mg by mouth at bedtime.      Marland Kitchen tetrahydrozoline 0.05 % ophthalmic solution Place 1 drop into both eyes daily as needed (for allergies).      . valsartan-hydrochlorothiazide (DIOVAN-HCT) 160-12.5 MG per tablet Take 1 tablet by mouth daily.        Assessment: 80 yo F admitted 10/12/2013 with ischemic leg. Pharmacy consulted to dose  heparin.  Coag: VTE, occluded R fem pop bypass, heparin infusing at 900 units/hr AM level at goal. Catheter lysis initiated at 0.5 mg/hr TNK, H/H stable, am fibrinogen 358 After lysis resumed heparin at 1215at 800 units/hr > q6h CBC/HL and fibrinogen, heparin just below goal on 800 units/hr  Goal of Therapy:  Heparin level 0.3-0.7 units/ml Monitor platelets by anticoagulation protocol: Yes   Plan:  1. Increase to 850 units/hr 2. Follow up with next q6h lab draw.  Thank you for allowing pharmacy to be a part of this patients care team.  Rowe Robert Pharm.D., BCPS, AQ-Cardiology Clinical Pharmacist 10/13/2013 6:35 PM Pager: [phone] Phone: [phone]

## 2013-10-13 NOTE — Progress Notes (Signed)
Patient ID: [name] [name], female   DOB: Nov 28, 1924, 80 y.o.   MRN: [id number] Patient is in interventional radiology and is just completing the diagnostic portion. This does show complete occlusion of her right femoral-popliteal bypass. There is no evidence of inflow disease and also no evidence of runoff disease. 4 placement of a directional lysis catheter and lysis with reevaluation in the morning.

## 2013-10-13 NOTE — Progress Notes (Signed)
Brief Radiology Progress Note  Pt allowed to run at full dose (0.5 mg/hr) TNK for 6 hrs.  Initial labs look good.  Talked with nursing and pt doing well.  Due to advanced age, TNK decreased at 6:00 pm to 0.25 mg/hr for the remainder of the evening to try and minimize risk.  Signed,  Criselda Peaches, MD [facility]

## 2013-10-13 NOTE — H&P (Signed)
Agree with PA note.  Based on less impressive clinical presentation than previously seen with graft thrombosis I suspect she may have recurrent stenosis at the proximal anastomosis.  Will proceed with angiogram and possible additional intervention as needed.  Signed,  Criselda Peaches, MD [facility]

## 2013-10-13 NOTE — Progress Notes (Signed)
Subjective: Interval History: none.. comfortable overnight. Reports calf claudication when she was up walking in the room  Objective: Vital signs in last 24 hours: Temp:  [97.4 F (36.3 C)-98.3 F (36.8 C)] 97.9 F (36.6 C) (06/21 0325) Pulse Rate:  [55-69] 58 (06/21 0325) Resp:  [18-23] 18 (06/21 0325) BP: (127-188)/(46-93) 128/61 mmHg (06/21 0325) SpO2:  [94 %-98 %] 95 % (06/21 0325) Weight:  [173 lb (78.472 kg)-174 lb 6.1 oz (79.1 kg)] 174 lb 6.1 oz (79.1 kg) (06/20 1957)  Intake/Output from previous day: 06/20 0701 - 06/21 0700 In: -  Out: 1550 [Urine:1550] Intake/Output this shift:    Right foot warm. Motor and sensory intact.  Lab Results:  Recent Labs  10/12/13 1750 10/13/13 0350  WBC 8.8 7.6  HGB 12.8 11.7*  HCT 37.3 35.2*  PLT 175 153   BMET  Recent Labs  10/12/13 1750  NA 138  K 4.5  CL 98  CO2 27  GLUCOSE 106*  BUN 18  CREATININE 0.96  CALCIUM 10.0    Studies/Results: No results found. Anti-infectives: Anti-infectives   None      Assessment/Plan: s/p * No surgery found * 4 arteriogram this morning in interventional radiology and possible thrombolysis versus surgery. We'll make plans pending study   LOS: 1 day   EARLY, TODD 10/13/2013, 8:50 AM

## 2013-10-13 NOTE — Procedures (Signed)
Interventional Radiology Procedure Note  Procedure: 1.) Pelvic and RLE angiogram - occluded Fem-BKpop bypass graft.  Moderate inflow stenosis at proximal stenosis 2.) Initiation of arterial lysis at 0.5 mg/hr tnk-TPA  Access:  Left CFA, 66F sheath Complications: None immediate Recommendations: - Lyse overnight - Q6 hr labs per protocol - Bedrest with leg straight - Clears only  Signed,  Criselda Peaches, MD [facility]

## 2013-10-13 NOTE — Progress Notes (Signed)
PHARMACY NOTE  Pharmacy Consult :  80 y.o. female is currently on Heparin infusion for ischemic leg.   Heparin Dosing Wt :  67 kg  Hematology :  Recent Labs  10/12/13 1750 10/13/13 0350  HGB 12.8 11.7*  HCT 37.3 35.2*  PLT 175 153  APTT 29  --   LABPROT 13.0  --   INR 1.00  --   HEPARINUNFRC  --  0.59  CREATININE 0.96  --     Current Medication[s] Include: Medication PTA: Prescriptions prior to admission  Medication Sig Dispense Refill  . aspirin 81 MG tablet Take 81 mg by mouth daily.      . calcium-vitamin D (OSCAL WITH D 500-200) 500-200 MG-UNIT per tablet Take 1 tablet by mouth daily.        . carbamazepine (TEGRETOL) 200 MG tablet Take 200 mg by mouth at bedtime.      . Cholecalciferol (VITAMIN D3) 1000 UNITS CAPS Take 1,000 Units by mouth daily.       . Naproxen Sodium (ALEVE PO) Take 220 mg by mouth daily as needed (for arthiritis). Back pain      . omeprazole (PRILOSEC) 20 MG capsule Take 20 mg by mouth every morning.       . simvastatin (ZOCOR) 40 MG tablet Take 40 mg by mouth at bedtime.      Marland Kitchen tetrahydrozoline 0.05 % ophthalmic solution Place 1 drop into both eyes daily as needed (for allergies).      . valsartan-hydrochlorothiazide (DIOVAN-HCT) 160-12.5 MG per tablet Take 1 tablet by mouth daily.       Scheduled:  Scheduled:  . pantoprazole  40 mg Oral Daily  . potassium chloride  20-40 mEq Oral Once   Infusion[s]: Infusions:  . dextrose 5 % and 0.45 % NaCl with KCl 20 mEq/L 50 mL/hr at 10/12/13 2219  . heparin 900 Units/hr (10/12/13 2027)   Assessment :  Heparin infusing at 900 units/hr  Heparin level 0.59 units/ml.  This is within the therapeutic range.  No evidence of bleeding complications observed.  Goal :  Heparin goal is Heparin level 0.3-0.7 units/ml.  Plan : 1. Heparin will be continued at 900 units/hr.   The next Heparin Level will be repeated in 8 hours to confirm therapeutic window. 2. Daily Heparin  level, CBC while on Heparin.  Monitor for bleeding complications. Follow Platelet counts.  Vikash Nest, Craig Guess,  Pharm.D  10/13/2013  4:34 AM

## 2013-10-13 NOTE — H&P (Signed)
Chief Complaint: "Right calf pain." Referring Physician: Dr. Donnetta Hutching HPI: [name] [name] is an 80 y.o. female with history of PVD s/p B/L fem-pop bypass in 1990 and 2009. The patient has had previous occlusions of RLE fem pop bypass including lysis therapy in 2010 followed by revision. She was seen in the office in 07/2013 with slight diminished ABI of RLE and presented to ED 6/20 after c/o RLE pain in her calf starting at 1pm. She also admits to a "cold" sensation that occurred at rest and with exertion. She states this episode is different from previous because she feels a "hot and tight" sensation rather than weakness and numbness. She has been admitted and on IV heparin overnight with improvement. She denies any resting pain now and states the "cold" sensation has improved. She denies any active bleeding and denies any history of cancer, brain tumor or stroke or history of GIB. She denies any kidney dysfunction or allergy to iodinated contrast. She denies any chest pain, shortness of breath or palpitations. She denies any recent fever or chills. The patient denies any history of sleep apnea or chronic oxygen use. She has previously tolerated sedation without complications. IR received request for RLE arteriogram today with possible intervention.     Past Medical History:  Past Medical History  Diagnosis Date  . GERD (gastroesophageal reflux disease)   . Hyperlipidemia   . History of colonic polyps   . Diverticulosis   . Shingles   . Eczema   . Allergic rhinoconjunctivitis   . Lung nodule 2011  . Environmental allergies     allergy shot- q friday in Dr. Janee Morn office. PFT's abnormal- recommended Spiriva to use preop & will d/c after surgery  . Arthritis     low back , stenosis  . Anxiety     pt. managed- uses deep breathing   . Stenosis of popliteal artery     blood clots in legs long ago    . COPD (chronic obstructive pulmonary disease)   . H/O hiatal hernia   . Hypertension   .  Trigeminal neuralgia   . Peripheral vascular disease   . DVT (deep venous thrombosis)     Past Surgical History:  Past Surgical History  Procedure Laterality Date  . Tubal ligation    . Cholecystectomy  1998  . Femoral-popliteal bypass graft         x2 surgeries 1990's & 2009  . Eye surgery      cataracts removed- bilateral /w IOL  . Tonsillectomy      as a teenager   . Tonsilectomy, adenoidectomy, bilateral myringotomy and tubes      no myringotomy tubes   . Lumbar fusion  07/06/2011  . Spine surgery  March 2013    Family History:  Family History  Problem Relation Age of Onset  . Arthritis Mother   . Hypertension Mother   . Heart disease Father   . Colon polyps Father   . Breast cancer Sister   . Lung cancer Sister   . Colon cancer Sister   . Cancer Sister     Breast  . Lung cancer Brother   . Cancer Brother     Lung  . Anesthesia problems Neg Hx   . Hypotension Neg Hx   . Malignant hyperthermia Neg Hx   . Pseudochol deficiency Neg Hx   . Cancer Daughter     Breast    Social History:  reports that she quit smoking about 20 years  ago. Her smoking use included Cigarettes. She has a 60 pack-year smoking history. She has never used smokeless tobacco. She reports that she does not drink alcohol or use illicit drugs.  Allergies:  Allergies  Allergen Reactions  . Sulfa Antibiotics Other (See Comments)    Cold sweat light headed and disorientation  . Tiotropium Bromide Shortness Of Breath and Other (See Comments)    Sore throat also  . Latex Itching and Rash    Medications:   Medication List    ASK your doctor about these medications       ALEVE PO  Take 220 mg by mouth daily as needed (for arthiritis). Back pain     aspirin 81 MG tablet  Take 81 mg by mouth daily.     calcium-vitamin D 500-200 MG-UNIT per tablet  Commonly known as:  OSCAL WITH D  Take 1 tablet by mouth daily.     carbamazepine 200 MG tablet  Commonly known as:  TEGRETOL  Take 200 mg  by mouth at bedtime.     PRILOSEC 20 MG capsule  Generic drug:  omeprazole  Take 20 mg by mouth every morning.     simvastatin 40 MG tablet  Commonly known as:  ZOCOR  Take 40 mg by mouth at bedtime.     tetrahydrozoline 0.05 % ophthalmic solution  Place 1 drop into both eyes daily as needed (for allergies).     valsartan-hydrochlorothiazide 160-12.5 MG per tablet  Commonly known as:  DIOVAN-HCT  Take 1 tablet by mouth daily.     Vitamin D3 1000 UNITS Caps  Take 1,000 Units by mouth daily.       Please HPI for pertinent positives, otherwise complete 10 system ROS negative.  Physical Exam: BP 128/61  Pulse 58  Temp(Src) 97.9 F (36.6 C) (Oral)  Resp 18  Ht 5' 2"  (1.575 m)  Wt 174 lb 6.1 oz (79.1 kg)  BMI 31.89 kg/m2  SpO2 95% Body mass index is 31.89 kg/(m^2).  General Appearance:  Alert, cooperative, no distress  Head:  Normocephalic, without obvious abnormality, atraumatic  Neck: Supple, symmetrical, trachea midline  Lungs:   Clear to auscultation bilaterally, no w/r/r, respirations unlabored without use of accessory muscles.  Chest Wall:  No tenderness or deformity  Heart:  Regular rate and rhythm, S1, S2 normal, no murmur, rub or gallop.  Abdomen:   Soft, non-tender, non distended, (+) BS  Extremities: Extremities normal, atraumatic, no cyanosis or edema  Pulses: LLE DP and PT 1+ , RLE (+) doppler DP and PT, feet bilaterally warm with good capillary refill  Neurologic: Normal affect, no gross deficits.   Results for orders placed during the hospital encounter of 10/12/13 (from the past 48 hour(s))  CBC     Status: None   Collection Time    10/12/13  5:50 PM      Result Value Ref Range   WBC 8.8  4.0 - 10.5 K/uL   RBC 4.41  3.87 - 5.11 MIL/uL   Hemoglobin 12.8  12.0 - 15.0 g/dL   HCT 37.3  36.0 - 46.0 %   MCV 84.6  78.0 - 100.0 fL   MCH 29.0  26.0 - 34.0 pg   MCHC 34.3  30.0 - 36.0 g/dL   RDW 13.1  11.5 - 15.5 %   Platelets 175  150 - 400 K/uL  BASIC  METABOLIC PANEL     Status: Abnormal   Collection Time    10/12/13  5:50 PM  Result Value Ref Range   Sodium 138  137 - 147 mEq/L   Potassium 4.5  3.7 - 5.3 mEq/L   Chloride 98  96 - 112 mEq/L   CO2 27  19 - 32 mEq/L   Glucose, Bld 106 (*) 70 - 99 mg/dL   BUN 18  6 - 23 mg/dL   Creatinine, Ser 0.96  0.50 - 1.10 mg/dL   Calcium 10.0  8.4 - 10.5 mg/dL   GFR calc non Af Amer 52 (*) >90 mL/min   GFR calc Af Amer 60 (*) >90 mL/min   Comment: (NOTE)     The eGFR has been calculated using the CKD EPI equation.     This calculation has not been validated in all clinical situations.     eGFR's persistently <90 mL/min signify possible Chronic Kidney     Disease.  PROTIME-INR     Status: None   Collection Time    10/12/13  5:50 PM      Result Value Ref Range   Prothrombin Time 13.0  11.6 - 15.2 seconds   INR 1.00  0.00 - 1.49  APTT     Status: None   Collection Time    10/12/13  5:50 PM      Result Value Ref Range   aPTT 29  24 - 37 seconds  URINALYSIS, ROUTINE W REFLEX MICROSCOPIC     Status: Abnormal   Collection Time    10/12/13  8:54 PM      Result Value Ref Range   Color, Urine YELLOW  YELLOW   APPearance CLEAR  CLEAR   Specific Gravity, Urine 1.017  1.005 - 1.030   pH 5.5  5.0 - 8.0   Glucose, UA NEGATIVE  NEGATIVE mg/dL   Hgb urine dipstick NEGATIVE  NEGATIVE   Bilirubin Urine NEGATIVE  NEGATIVE   Ketones, ur NEGATIVE  NEGATIVE mg/dL   Protein, ur NEGATIVE  NEGATIVE mg/dL   Urobilinogen, UA 0.2  0.0 - 1.0 mg/dL   Nitrite NEGATIVE  NEGATIVE   Leukocytes, UA MODERATE (*) NEGATIVE  URINE MICROSCOPIC-ADD ON     Status: None   Collection Time    10/12/13  8:54 PM      Result Value Ref Range   Squamous Epithelial / LPF RARE  RARE   WBC, UA 3-6  <3 WBC/hpf  HEPARIN LEVEL (UNFRACTIONATED)     Status: None   Collection Time    10/13/13  3:50 AM      Result Value Ref Range   Heparin Unfractionated 0.59  0.30 - 0.70 IU/mL   Comment:            IF HEPARIN RESULTS ARE  BELOW     EXPECTED VALUES, AND PATIENT     DOSAGE HAS BEEN CONFIRMED,     SUGGEST FOLLOW UP TESTING     OF ANTITHROMBIN III LEVELS.  CBC     Status: Abnormal   Collection Time    10/13/13  3:50 AM      Result Value Ref Range   WBC 7.6  4.0 - 10.5 K/uL   RBC 4.12  3.87 - 5.11 MIL/uL   Hemoglobin 11.7 (*) 12.0 - 15.0 g/dL   HCT 35.2 (*) 36.0 - 46.0 %   MCV 85.4  78.0 - 100.0 fL   MCH 28.4  26.0 - 34.0 pg   MCHC 33.2  30.0 - 36.0 g/dL   RDW 13.3  11.5 - 15.5 %   Platelets 153  150 - 400 K/uL   No results found.  Assessment/Plan Right lower extremity pain with diminished doppler flow now on IV heparin History of multiple vascular surgeries including RLE fem pop bypass s/p lysis and revision, follows with Dr. Donnetta Hutching Request for image guided right lower extremity arteriogram with possible catheter lysis, balloon or stenting Patient has been NPO, labs reviewed, no evidence of recent bleeding or active bleeding. Risks and Benefits discussed with the patient. All of the patient's questions were answered, patient is agreeable to proceed. Consent signed and in chart.   Tsosie Billing D PA-C 10/13/2013, 9:29 AM

## 2013-10-14 ENCOUNTER — Inpatient Hospital Stay (HOSPITAL_COMMUNITY): Payer: Medicare Other

## 2013-10-14 LAB — HEPARIN LEVEL (UNFRACTIONATED)
HEPARIN UNFRACTIONATED: 0.34 [IU]/mL (ref 0.30–0.70)
Heparin Unfractionated: 0.34 IU/mL (ref 0.30–0.70)
Heparin Unfractionated: 1.08 IU/mL — ABNORMAL HIGH (ref 0.30–0.70)
Heparin Unfractionated: 0.39 IU/mL (ref 0.30–0.70)

## 2013-10-14 LAB — POCT ACTIVATED CLOTTING TIME: ACTIVATED CLOTTING TIME: 123 s

## 2013-10-14 LAB — BASIC METABOLIC PANEL
BUN: 14 mg/dL (ref 6–23)
CHLORIDE: 101 meq/L (ref 96–112)
CO2: 26 mEq/L (ref 19–32)
Calcium: 8.7 mg/dL (ref 8.4–10.5)
Creatinine, Ser: 0.85 mg/dL (ref 0.50–1.10)
GFR calc non Af Amer: 60 mL/min — ABNORMAL LOW (ref >90)
GFR, EST AFRICAN AMERICAN: 70 mL/min — AB (ref >90)
Glucose, Bld: 150 mg/dL — ABNORMAL HIGH (ref 70–99)
POTASSIUM: 4.6 meq/L (ref 3.7–5.3)
SODIUM: 138 meq/L (ref 137–147)

## 2013-10-14 LAB — CBC
HCT: 34.6 % — ABNORMAL LOW (ref 36.0–46.0)
HCT: 32.8 % — ABNORMAL LOW (ref 36.0–46.0)
HEMOGLOBIN: 11.2 g/dL — AB (ref 12.0–15.0)
HEMOGLOBIN: 10.5 g/dL — AB (ref 12.0–15.0)
MCH: 28.1 pg (ref 26.0–34.0)
MCH: 27.9 pg (ref 26.0–34.0)
MCHC: 32.4 g/dL (ref 30.0–36.0)
MCHC: 32 g/dL (ref 30.0–36.0)
MCV: 86.9 fL (ref 78.0–100.0)
MCV: 87.2 fL (ref 78.0–100.0)
Platelets: 147 10*3/uL — ABNORMAL LOW (ref 150–400)
Platelets: 131 10*3/uL — ABNORMAL LOW (ref 150–400)
RBC: 3.98 MIL/uL (ref 3.87–5.11)
RBC: 3.76 MIL/uL — AB (ref 3.87–5.11)
RDW: 13.6 % (ref 11.5–15.5)
RDW: 13.5 % (ref 11.5–15.5)
WBC: 8.9 10*3/uL (ref 4.0–10.5)
WBC: 8.6 10*3/uL (ref 4.0–10.5)

## 2013-10-14 LAB — FIBRINOGEN
FIBRINOGEN: 300 mg/dL (ref 204–475)
FIBRINOGEN: 297 mg/dL (ref 204–475)
Fibrinogen: 271 mg/dL (ref 204–475)

## 2013-10-14 MED ORDER — FENTANYL CITRATE 0.05 MG/ML IJ SOLN
INTRAMUSCULAR | Status: AC
  Filled 2013-10-14: qty 4

## 2013-10-14 MED ORDER — IOHEXOL 300 MG/ML  SOLN
100.0000 mL | Freq: Once | INTRAMUSCULAR | Status: AC | PRN
  Administered 2013-10-14: 100 mL via INTRA_ARTERIAL

## 2013-10-14 MED ORDER — MIDAZOLAM HCL 2 MG/2ML IJ SOLN
INTRAMUSCULAR | Status: AC
  Filled 2013-10-14: qty 4

## 2013-10-14 NOTE — Progress Notes (Addendum)
ANTICOAGULATION CONSULT NOTE - Follow Up Consult  Pharmacy Consult for Heparin Indication: DVT, catheter directed lysis  Allergies  Allergen Reactions  . Sulfa Antibiotics Other (See Comments)    Cold sweat light headed and disorientation  . Tiotropium Bromide Shortness Of Breath and Other (See Comments)    Sore throat also  . Latex Itching and Rash    Patient Measurements: Height: 5\' 2"  (157.5 cm) Weight: 174 lb 6.1 oz (79.1 kg) IBW/kg (Calculated) : 50.1  Vital Signs: Temp: 97.8 F (36.6 C) (06/22 0815) Temp src: Axillary (06/22 0815) BP: 120/59 mmHg (06/22 0700) Pulse Rate: 70 (06/22 0800)  Labs:  Recent Labs  10/12/13 1750  10/13/13 1700 10/13/13 2300 10/14/13 0500  HGB 12.8  < > 11.3* 11.3* 11.2*  HCT 37.3  < > 34.4* 34.6* 34.6*  PLT 175  < > 152 147* 147*  APTT 29  --   --   --   --   LABPROT 13.0  --   --   --   --   INR 1.00  --   --   --   --   HEPARINUNFRC  --   < > 0.22* 0.34 0.34  CREATININE 0.96  --   --   --  0.85  < > = values in this interval not displayed.  Estimated Creatinine Clearance: 46.3 ml/min (by C-G formula based on Cr of 0.85).   Medications:  Prescriptions prior to admission  Medication Sig Dispense Refill  . aspirin 81 MG tablet Take 81 mg by mouth daily.      . calcium-vitamin D (OSCAL WITH D 500-200) 500-200 MG-UNIT per tablet Take 1 tablet by mouth daily.        . carbamazepine (TEGRETOL) 200 MG tablet Take 200 mg by mouth at bedtime.      . Cholecalciferol (VITAMIN D3) 1000 UNITS CAPS Take 1,000 Units by mouth daily.       . Naproxen Sodium (ALEVE PO) Take 220 mg by mouth daily as needed (for arthiritis). Back pain      . omeprazole (PRILOSEC) 20 MG capsule Take 20 mg by mouth every morning.       . simvastatin (ZOCOR) 40 MG tablet Take 40 mg by mouth at bedtime.      Marland Kitchen tetrahydrozoline 0.05 % ophthalmic solution Place 1 drop into both eyes daily as needed (for allergies).      . valsartan-hydrochlorothiazide (DIOVAN-HCT)  160-12.5 MG per tablet Take 1 tablet by mouth daily.        Assessment: 77 yof continues on IV heparin and TNK. Heparin level remains at goal at 0.34. CBC is stable and no bleeding. Fibrinogen is 300.   Goal of Therapy:  Heparin level 0.2-0.5 units/ml Monitor platelets by anticoagulation protocol: Yes   Plan:  1. Continue heparin 850 units/hr 2. F/u [facility] labs  Salome Arnt, PharmD, BCPS Pager # [phone] 10/14/2013 11:15 AM  Addendum: 6 hour heparin level checked and was high at 1.08. However this is very inconsistant with previous labs. Asked RN to recheck a STAT level to be sure lab is accurate. Recheck came back at 0.39 which is at goal.   Plan: 1. Continue heparin gtt 850 units/hr 2. Continue Q6H labs  Salome Arnt, PharmD, BCPS Pager # [phone] 10/14/2013 2:20 PM

## 2013-10-14 NOTE — Progress Notes (Signed)
PHARMACY NOTE  Pharmacy Consult :  80 y.o. female is currently on Heparin infusion for DVT, catheter directed lysis  Heparin Dosing Wt :  67 kg  Hematology :  Recent Labs  10/12/13 1750 10/13/13 0350 10/13/13 1700 10/13/13 2300  HGB 12.8 11.7* 11.3* 11.3*  HCT 37.3 35.2* 34.4* 34.6*  PLT 175 153 152 147*  APTT 29  --   --   --   LABPROT 13.0  --   --   --   INR 1.00  --   --   --   HEPARINUNFRC  --  0.59 0.22* 0.34  CREATININE 0.96  --   --   --     Current Medication[s] Include: Scheduled:  . pantoprazole  40 mg Oral Daily  . potassium chloride  20-40 mEq Oral Once  . sodium chloride  3 mL Intravenous Q12H   Infusion[s]: . dextrose 5 % and 0.45 % NaCl with KCl 20 mEq/L 50 mL/hr at 10/14/13 0000  . heparin 850 Units/hr (10/14/13 0000)  . tenecteplase (TNKase) infusion 0.25 mg/hr (10/14/13 0000)    Assessment :  Heparin infusing at 850 units/hr.  Tenecteplase is continuing at 0.25 mg/hr.    Heparin level 0.34 units/hr.  This is within therapeutic range of heparin with a lytic agent.  No evidence of bleeding complications observed.  Goal :  Heparin goal is 0.2 - 0.5 units/ml.when given with a lytic agent.  Plan : 1. Continuing Tenecteplase as previously ordered. 2. Heparin will be continued at 850 units/hr.   The next Heparin Level, CBC, Fibrinogen in 6 hours  Stramoski, Craig Guess,  Pharm.D  10/14/2013  12:59 AM

## 2013-10-14 NOTE — Progress Notes (Signed)
Utilization review completed. Bertha Stanfill, RN, BSN. 

## 2013-10-14 NOTE — Procedures (Signed)
RLE lysis check: complete No complication No blood loss. See complete dictation in [facility].

## 2013-10-14 NOTE — Progress Notes (Signed)
Subjective: Interval History: none.. currently in angiography suite  Objective: Vital signs in last 24 hours: Temp:  [97.4 F (36.3 C)-98.3 F (36.8 C)] 98.1 F (36.7 C) (06/22 1541) Pulse Rate:  [53-72] 65 (06/22 1623) Resp:  [9-24] 19 (06/22 1710) BP: (104-177)/(38-101) 177/54 mmHg (06/22 1710) SpO2:  [93 %-100 %] 96 % (06/22 1710)  Intake/Output from previous day: 06/21 0701 - 06/22 0700 In: 2593.5 [P.O.:480; I.V.:1488.5; IV Piggyback:625] Out: 1675 [Urine:1675] Intake/Output this shift: Total I/O In: 1291.5 [P.O.:360; I.V.:706.5; IV Piggyback:225] Out: 775 [Urine:775]   Lab Results:  Recent Labs  10/14/13 0500 10/14/13 1100  WBC 8.9 8.6  HGB 11.2* 10.5*  HCT 34.6* 32.8*  PLT 147* 131*   BMET  Recent Labs  10/12/13 1750 10/14/13 0500  NA 138 138  K 4.5 4.6  CL 98 101  CO2 27 26  GLUCOSE 106* 150*  BUN 18 14  CREATININE 0.96 0.85  CALCIUM 10.0 8.7    Studies/Results: Ir Angiogram Extremity Right  10/13/2013   CLINICAL DATA:  80 year old female with peripheral arterial disease well known to both the vascular surgery and interventional radiology services. She has bilateral femoral to popliteal bypass grafts. She is currently admitted with acute onset of right lower extremity claudication concerning for recurrent thrombosis of her right femoral to below the knee popliteal bypass graft. This graft has thrombosed in the past due to a combination of inflow and outflow disease. The inflow disease was treated with percutaneous angioplasty and the outflow treated initially by percutaneous angioplasty and ultimately by surgical revision with a 6 mm in-line Gotex jump graft to the below the knee popliteal artery. This performed by Dr. Oneida Alar in December of 2010. Her most recent arteriogram was performed in February of 2013 and demonstrated patency of both grafts with minimal (25%) stenosis at the proximal anastomosis on the right.  EXAM: RIGHT EXTREMITY ARTERIOGRAPHY;  ADDITIONAL ARTERIOGRAPHY; IR INFUSION THROMBOL ARTERIAL INITIAL (MS); IR ULTRASOUND GUIDANCE VASC ACCESS LEFT  Date: 10/13/2013  PROCEDURE: 1. Ultrasound-guided puncture of the left common femoral artery 2. Catheterization of the abdominal aorta with distal abdominal and pelvic arteriogram 3. Up in over catheterization of the right common femoral artery 4. Multi station right lower extremity arteriogram 5. Placement of a 90 cm total length 40 cm infusion length Unifuse infusion catheter 6. Initiation of arterial thrombolysis Interventional Radiologist:  Criselda Peaches, MD  ANESTHESIA/SEDATION: Moderate (conscious) sedation was used. Four mg Versed, 137.5 mcg Fentanyl were administered intravenously. The patient's vital signs were monitored continuously by radiology nursing throughout the procedure.  Sedation Time: 80 minutes  FLUOROSCOPY TIME:  10 minutes  CONTRAST:  71mL VISIPAQUE IODIXANOL 320 MG/ML IV SOLN  TECHNIQUE: Informed consent was obtained from the patient following explanation of the procedure, risks, benefits and alternatives. The patient understands, agrees and consents for the procedure. All questions were addressed. A time out was performed.  Maximal barrier sterile technique utilized including caps, mask, sterile gowns, sterile gloves, large sterile drape, hand hygiene, and Betadine skin prep.  The left groin was interrogated with ultrasound. The common femoral artery was identified and found to be widely patent. And images obtained and stored for the medical record. Local anesthesia was attained by infiltration with 1% lidocaine. A small dermatotomy was made. Under real-time sonographic guidance, the common femoral artery was punctured with a 21 gauge micropuncture needle. An image was obtained and stored for the medical record.  With the aid of a 4 [place] transitional micro sheath an Amplatz wire  was advanced into the common iliac vein. The micro sheath was then exchanged for a working 5  [place] vascular sheath. A glidewire was successfully advanced into the abdominal aorta. There is at least some stenosis of the origin of the left common iliac artery. A pigtail catheter was advanced into the abdominal aorta at the bifurcation and a pelvic arteriogram was performed. There is mild-to-moderate narrowing of the origin of the left common iliac artery. The right common iliac artery is patent. The external and common femoral arteries are widely patent bilaterally. The bilateral profunda femoral arteries are unremarkable.  The pigtail catheter was exchanged for an angled catheter in the angled catheter was advanced into the right common femoral artery. A multi station right common femoral arteriogram was then performed. There is a focal approximately 30- 40% stenosis at the proximal graft anastomosis. Approximately 2 cm beyond the anastomosis the graft fails to opacified with contrast material consistent with thrombosis. There are prominent profunda collaterals which reconstitute the below the knee popliteal artery. The distal graft to popliteal anastomosis appears widely patent. The native by in the knee popliteal artery opacifies via retrograde flow. There is persistent 2 vessel runoff to the ankle from the anterior tibial and peroneal arteries. The posterior tibial artery remains occluded as previously seen.  A Rosen wire was advanced in the right common femoral artery. The 5 [place] vascular sheath was exchanged for a 6 [place] cook Ansel flexor sheath which was advanced up and over the bifurcation and into the right common femoral artery. Using an angled catheter and Bentson wire, the Bentson wire was advanced into the patent below the knee popliteal artery. A 5 French Unifuse multi side hole infusion catheter (90 cm total length, 50 cm infusion length) was then advanced over the wire and positioned with the proximal side holes just above the thrombosed portion of the graft in the distal sideholes just  within the distal aspect of the graft.  The infusion catheter was secured to the 6 French sheath. Sterile dressings were applied. Arterial thrombolysis was initiated at a rate of 0.5 mg/hr TPA. The patient tolerated the procedure well. There was no immediate complication.  COMPLICATIONS: None immediate.  IMPRESSION: 1. 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 There is a mild-moderate (30- 40%) stenosis at the proximal anastomosis. The distal anastomosis appears widely patent. Suspect inflow stenosis may have resulted in slow and or turbulent flow resulting in graft thrombosis. 2. Initiation of arterial thrombolysis at 0.5 mg/hr tenecteplace. 3. Repeat angiogram the morning of 10/14/2013 with possible angioplasty of inflow stenosis. Discuss results of morning angiogram with Dr. Oneida Alar. Signed,  Criselda Peaches, MD  [facility]  [facility]   Electronically Signed   By: Jacqulynn Cadet M.D.   On: 10/13/2013 14:23   Ir Angiogram Selective Each Additional Vessel  10/13/2013   CLINICAL DATA:  80 year old female with peripheral arterial disease well known to both the vascular surgery and interventional radiology services. She has bilateral femoral to popliteal bypass grafts. She is currently admitted with acute onset of right lower extremity claudication concerning for recurrent thrombosis of her right femoral to below the knee popliteal bypass graft. This graft has thrombosed in the past due to a combination of inflow and outflow disease. The inflow disease was treated with percutaneous angioplasty and the outflow treated initially by percutaneous angioplasty and ultimately by surgical revision with a 6 mm in-line Gotex jump graft to  the below the knee popliteal artery. This performed by Dr. Oneida Alar in December of 2010. Her most recent arteriogram was performed in February of 2013 and demonstrated patency  of both grafts with minimal (25%) stenosis at the proximal anastomosis on the right.  EXAM: RIGHT EXTREMITY ARTERIOGRAPHY; ADDITIONAL ARTERIOGRAPHY; IR INFUSION THROMBOL ARTERIAL INITIAL (MS); IR ULTRASOUND GUIDANCE VASC ACCESS LEFT  Date: 10/13/2013  PROCEDURE: 1. Ultrasound-guided puncture of the left common femoral artery 2. Catheterization of the abdominal aorta with distal abdominal and pelvic arteriogram 3. Up in over catheterization of the right common femoral artery 4. Multi station right lower extremity arteriogram 5. Placement of a 90 cm total length 40 cm infusion length Unifuse infusion catheter 6. Initiation of arterial thrombolysis Interventional Radiologist:  Criselda Peaches, MD  ANESTHESIA/SEDATION: Moderate (conscious) sedation was used. Four mg Versed, 137.5 mcg Fentanyl were administered intravenously. The patient's vital signs were monitored continuously by radiology nursing throughout the procedure.  Sedation Time: 80 minutes  FLUOROSCOPY TIME:  10 minutes  CONTRAST:  44mL VISIPAQUE IODIXANOL 320 MG/ML IV SOLN  TECHNIQUE: Informed consent was obtained from the patient following explanation of the procedure, risks, benefits and alternatives. The patient understands, agrees and consents for the procedure. All questions were addressed. A time out was performed.  Maximal barrier sterile technique utilized including caps, mask, sterile gowns, sterile gloves, large sterile drape, hand hygiene, and Betadine skin prep.  The left groin was interrogated with ultrasound. The common femoral artery was identified and found to be widely patent. And images obtained and stored for the medical record. Local anesthesia was attained by infiltration with 1% lidocaine. A small dermatotomy was made. Under real-time sonographic guidance, the common femoral artery was punctured with a 21 gauge micropuncture needle. An image was obtained and stored for the medical record.  With the aid of a 4 [place] transitional  micro sheath an Amplatz wire was advanced into the common iliac vein. The micro sheath was then exchanged for a working 5 [place] vascular sheath. A glidewire was successfully advanced into the abdominal aorta. There is at least some stenosis of the origin of the left common iliac artery. A pigtail catheter was advanced into the abdominal aorta at the bifurcation and a pelvic arteriogram was performed. There is mild-to-moderate narrowing of the origin of the left common iliac artery. The right common iliac artery is patent. The external and common femoral arteries are widely patent bilaterally. The bilateral profunda femoral arteries are unremarkable.  The pigtail catheter was exchanged for an angled catheter in the angled catheter was advanced into the right common femoral artery. A multi station right common femoral arteriogram was then performed. There is a focal approximately 30- 40% stenosis at the proximal graft anastomosis. Approximately 2 cm beyond the anastomosis the graft fails to opacified with contrast material consistent with thrombosis. There are prominent profunda collaterals which reconstitute the below the knee popliteal artery. The distal graft to popliteal anastomosis appears widely patent. The native by in the knee popliteal artery opacifies via retrograde flow. There is persistent 2 vessel runoff to the ankle from the anterior tibial and peroneal arteries. The posterior tibial artery remains occluded as previously seen.  A Rosen wire was advanced in the right common femoral artery. The 5 [place] vascular sheath was exchanged for a 6 [place] cook Ansel flexor sheath which was advanced up and over the bifurcation and into the right common femoral artery. Using an angled catheter and Bentson wire, the Bentson wire was advanced into  the patent below the knee popliteal artery. A 5 French Unifuse multi side hole infusion catheter (90 cm total length, 50 cm infusion length) was then advanced over the wire  and positioned with the proximal side holes just above the thrombosed portion of the graft in the distal sideholes just within the distal aspect of the graft.  The infusion catheter was secured to the 6 French sheath. Sterile dressings were applied. Arterial thrombolysis was initiated at a rate of 0.5 mg/hr TPA. The patient tolerated the procedure well. There was no immediate complication.  COMPLICATIONS: None immediate.  IMPRESSION: 1. 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 There is a mild-moderate (30- 40%) stenosis at the proximal anastomosis. The distal anastomosis appears widely patent. Suspect inflow stenosis may have resulted in slow and or turbulent flow resulting in graft thrombosis. 2. Initiation of arterial thrombolysis at 0.5 mg/hr tenecteplace. 3. Repeat angiogram the morning of 10/14/2013 with possible angioplasty of inflow stenosis. Discuss results of morning angiogram with Dr. Oneida Alar. Signed,  Criselda Peaches, MD  [facility]  [facility]iology   Electronically Signed   By: Jacqulynn Cadet M.D.   On: 10/13/2013 14:23   Ir US Guide Vasc Access Left  10/13/2013   CLINICAL DATA:  80 year old female with peripheral arterial disease well known to both the vascular surgery and interventional radiology services. She has bilateral femoral to popliteal bypass grafts. She is currently admitted with acute onset of right lower extremity claudication concerning for recurrent thrombosis of her right femoral to below the knee popliteal bypass graft. This graft has thrombosed in the past due to a combination of inflow and outflow disease. The inflow disease was treated with percutaneous angioplasty and the outflow treated initially by percutaneous angioplasty and ultimately by surgical revision with a 6 mm in-line Gotex jump graft to the below the knee popliteal artery. This performed by Dr. Oneida Alar in  December of 2010. Her most recent arteriogram was performed in February of 2013 and demonstrated patency of both grafts with minimal (25%) stenosis at the proximal anastomosis on the right.  EXAM: RIGHT EXTREMITY ARTERIOGRAPHY; ADDITIONAL ARTERIOGRAPHY; IR INFUSION THROMBOL ARTERIAL INITIAL (MS); IR ULTRASOUND GUIDANCE VASC ACCESS LEFT  Date: 10/13/2013  PROCEDURE: 1. Ultrasound-guided puncture of the left common femoral artery 2. Catheterization of the abdominal aorta with distal abdominal and pelvic arteriogram 3. Up in over catheterization of the right common femoral artery 4. Multi station right lower extremity arteriogram 5. Placement of a 90 cm total length 40 cm infusion length Unifuse infusion catheter 6. Initiation of arterial thrombolysis Interventional Radiologist:  Criselda Peaches, MD  ANESTHESIA/SEDATION: Moderate (conscious) sedation was used. Four mg Versed, 137.5 mcg Fentanyl were administered intravenously. The patient's vital signs were monitored continuously by radiology nursing throughout the procedure.  Sedation Time: 80 minutes  FLUOROSCOPY TIME:  10 minutes  CONTRAST:  50mL VISIPAQUE IODIXANOL 320 MG/ML IV SOLN  TECHNIQUE: Informed consent was obtained from the patient following explanation of the procedure, risks, benefits and alternatives. The patient understands, agrees and consents for the procedure. All questions were addressed. A time out was performed.  Maximal barrier sterile technique utilized including caps, mask, sterile gowns, sterile gloves, large sterile drape, hand hygiene, and Betadine skin prep.  The left groin was interrogated with ultrasound. The common femoral artery was identified and found to be widely patent. And images obtained and stored for the medical record. Local anesthesia was attained by infiltration  with 1% lidocaine. A small dermatotomy was made. Under real-time sonographic guidance, the common femoral artery was punctured with a 21 gauge micropuncture  needle. An image was obtained and stored for the medical record.  With the aid of a 4 [place] transitional micro sheath an Amplatz wire was advanced into the common iliac vein. The micro sheath was then exchanged for a working 5 [place] vascular sheath. A glidewire was successfully advanced into the abdominal aorta. There is at least some stenosis of the origin of the left common iliac artery. A pigtail catheter was advanced into the abdominal aorta at the bifurcation and a pelvic arteriogram was performed. There is mild-to-moderate narrowing of the origin of the left common iliac artery. The right common iliac artery is patent. The external and common femoral arteries are widely patent bilaterally. The bilateral profunda femoral arteries are unremarkable.  The pigtail catheter was exchanged for an angled catheter in the angled catheter was advanced into the right common femoral artery. A multi station right common femoral arteriogram was then performed. There is a focal approximately 30- 40% stenosis at the proximal graft anastomosis. Approximately 2 cm beyond the anastomosis the graft fails to opacified with contrast material consistent with thrombosis. There are prominent profunda collaterals which reconstitute the below the knee popliteal artery. The distal graft to popliteal anastomosis appears widely patent. The native by in the knee popliteal artery opacifies via retrograde flow. There is persistent 2 vessel runoff to the ankle from the anterior tibial and peroneal arteries. The posterior tibial artery remains occluded as previously seen.  A Rosen wire was advanced in the right common femoral artery. The 5 [place] vascular sheath was exchanged for a 6 [place] cook Ansel flexor sheath which was advanced up and over the bifurcation and into the right common femoral artery. Using an angled catheter and Bentson wire, the Bentson wire was advanced into the patent below the knee popliteal artery. A 5 French Unifuse  multi side hole infusion catheter (90 cm total length, 50 cm infusion length) was then advanced over the wire and positioned with the proximal side holes just above the thrombosed portion of the graft in the distal sideholes just within the distal aspect of the graft.  The infusion catheter was secured to the 6 French sheath. Sterile dressings were applied. Arterial thrombolysis was initiated at a rate of 0.5 mg/hr TPA. The patient tolerated the procedure well. There was no immediate complication.  COMPLICATIONS: None immediate.  IMPRESSION: 1. 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 There is a mild-moderate (30- 40%) stenosis at the proximal anastomosis. The distal anastomosis appears widely patent. Suspect inflow stenosis may have resulted in slow and or turbulent flow resulting in graft thrombosis. 2. Initiation of arterial thrombolysis at 0.5 mg/hr tenecteplace. 3. Repeat angiogram the morning of 10/14/2013 with possible angioplasty of inflow stenosis. Discuss results of morning angiogram with Dr. Oneida Alar. Signed,  Criselda Peaches, MD  [facility]  [facility]   Electronically Signed   By: Jacqulynn Cadet M.D.   On: 10/13/2013 14:23   Ir Infusion Thrombol Arterial Initial (ms)  10/13/2013   CLINICAL DATA:  80 year old female with peripheral arterial disease well known to both the vascular surgery and interventional radiology services. She has bilateral femoral to popliteal bypass grafts. She is currently admitted with acute onset of right lower extremity claudication concerning for recurrent thrombosis of her right femoral to below the knee popliteal  bypass graft. This graft has thrombosed in the past due to a combination of inflow and outflow disease. The inflow disease was treated with percutaneous angioplasty and the outflow treated initially by percutaneous angioplasty and ultimately by  surgical revision with a 6 mm in-line Gotex jump graft to the below the knee popliteal artery. This performed by Dr. Oneida Alar in December of 2010. Her most recent arteriogram was performed in February of 2013 and demonstrated patency of both grafts with minimal (25%) stenosis at the proximal anastomosis on the right.  EXAM: RIGHT EXTREMITY ARTERIOGRAPHY; ADDITIONAL ARTERIOGRAPHY; IR INFUSION THROMBOL ARTERIAL INITIAL (MS); IR ULTRASOUND GUIDANCE VASC ACCESS LEFT  Date: 10/13/2013  PROCEDURE: 1. Ultrasound-guided puncture of the left common femoral artery 2. Catheterization of the abdominal aorta with distal abdominal and pelvic arteriogram 3. Up in over catheterization of the right common femoral artery 4. Multi station right lower extremity arteriogram 5. Placement of a 90 cm total length 40 cm infusion length Unifuse infusion catheter 6. Initiation of arterial thrombolysis Interventional Radiologist:  Criselda Peaches, MD  ANESTHESIA/SEDATION: Moderate (conscious) sedation was used. Four mg Versed, 137.5 mcg Fentanyl were administered intravenously. The patient's vital signs were monitored continuously by radiology nursing throughout the procedure.  Sedation Time: 80 minutes  FLUOROSCOPY TIME:  10 minutes  CONTRAST:  51mL VISIPAQUE IODIXANOL 320 MG/ML IV SOLN  TECHNIQUE: Informed consent was obtained from the patient following explanation of the procedure, risks, benefits and alternatives. The patient understands, agrees and consents for the procedure. All questions were addressed. A time out was performed.  Maximal barrier sterile technique utilized including caps, mask, sterile gowns, sterile gloves, large sterile drape, hand hygiene, and Betadine skin prep.  The left groin was interrogated with ultrasound. The common femoral artery was identified and found to be widely patent. And images obtained and stored for the medical record. Local anesthesia was attained by infiltration with 1% lidocaine. A small  dermatotomy was made. Under real-time sonographic guidance, the common femoral artery was punctured with a 21 gauge micropuncture needle. An image was obtained and stored for the medical record.  With the aid of a 4 [place] transitional micro sheath an Amplatz wire was advanced into the common iliac vein. The micro sheath was then exchanged for a working 5 [place] vascular sheath. A glidewire was successfully advanced into the abdominal aorta. There is at least some stenosis of the origin of the left common iliac artery. A pigtail catheter was advanced into the abdominal aorta at the bifurcation and a pelvic arteriogram was performed. There is mild-to-moderate narrowing of the origin of the left common iliac artery. The right common iliac artery is patent. The external and common femoral arteries are widely patent bilaterally. The bilateral profunda femoral arteries are unremarkable.  The pigtail catheter was exchanged for an angled catheter in the angled catheter was advanced into the right common femoral artery. A multi station right common femoral arteriogram was then performed. There is a focal approximately 30- 40% stenosis at the proximal graft anastomosis. Approximately 2 cm beyond the anastomosis the graft fails to opacified with contrast material consistent with thrombosis. There are prominent profunda collaterals which reconstitute the below the knee popliteal artery. The distal graft to popliteal anastomosis appears widely patent. The native by in the knee popliteal artery opacifies via retrograde flow. There is persistent 2 vessel runoff to the ankle from the anterior tibial and peroneal arteries. The posterior tibial artery remains occluded as previously seen.  A Rosen wire was advanced in  the right common femoral artery. The 5 [place] vascular sheath was exchanged for a 6 [place] cook Ansel flexor sheath which was advanced up and over the bifurcation and into the right common femoral artery. Using an  angled catheter and Bentson wire, the Bentson wire was advanced into the patent below the knee popliteal artery. A 5 French Unifuse multi side hole infusion catheter (90 cm total length, 50 cm infusion length) was then advanced over the wire and positioned with the proximal side holes just above the thrombosed portion of the graft in the distal sideholes just within the distal aspect of the graft.  The infusion catheter was secured to the 6 French sheath. Sterile dressings were applied. Arterial thrombolysis was initiated at a rate of 0.5 mg/hr TPA. The patient tolerated the procedure well. There was no immediate complication.  COMPLICATIONS: None immediate.  IMPRESSION: 1. 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 There is a mild-moderate (30- 40%) stenosis at the proximal anastomosis. The distal anastomosis appears widely patent. Suspect inflow stenosis may have resulted in slow and or turbulent flow resulting in graft thrombosis. 2. Initiation of arterial thrombolysis at 0.5 mg/hr tenecteplace. 3. Repeat angiogram the morning of 10/14/2013 with possible angioplasty of inflow stenosis. Discuss results of morning angiogram with Dr. Oneida Alar. Signed,  Criselda Peaches, MD  [facility]  [facility]   Electronically Signed   By: Jacqulynn Cadet M.D.   On: 10/13/2013 14:23   Anti-infectives: Anti-infectives   None      Assessment/Plan: s/p * No surgery found * Reviewed the films with Dr. Vernard Gambles and discussed with the patient. Complete resolution of clot in her right femoral to below-knee popliteal bypass. For discontinuation of femoral sheath in the morning.   LOS: 2 days   EARLY, TODD 10/14/2013, 5:28 PM

## 2013-10-15 LAB — CBC
HEMATOCRIT: 31.4 % — AB (ref 36.0–46.0)
Hemoglobin: 10.5 g/dL — ABNORMAL LOW (ref 12.0–15.0)
MCH: 28.8 pg (ref 26.0–34.0)
MCHC: 33.4 g/dL (ref 30.0–36.0)
MCV: 86.3 fL (ref 78.0–100.0)
PLATELETS: 119 10*3/uL — AB (ref 150–400)
RBC: 3.64 MIL/uL — ABNORMAL LOW (ref 3.87–5.11)
RDW: 13.4 % (ref 11.5–15.5)
WBC: 8.1 10*3/uL (ref 4.0–10.5)

## 2013-10-15 MED ORDER — PATIENT'S GUIDE TO USING COUMADIN BOOK
Freq: Once | Status: AC
  Administered 2013-10-15: 18:00:00
  Filled 2013-10-15: qty 1

## 2013-10-15 MED ORDER — WARFARIN SODIUM 5 MG PO TABS
5.0000 mg | ORAL_TABLET | Freq: Once | ORAL | Status: AC
  Administered 2013-10-15: 5 mg via ORAL
  Filled 2013-10-15: qty 1

## 2013-10-15 MED ORDER — HYDROCHLOROTHIAZIDE 12.5 MG PO CAPS
12.5000 mg | ORAL_CAPSULE | Freq: Every day | ORAL | Status: DC
  Administered 2013-10-15 – 2013-10-17 (×3): 12.5 mg via ORAL
  Filled 2013-10-15 (×3): qty 1

## 2013-10-15 MED ORDER — HEPARIN (PORCINE) IN NACL 100-0.45 UNIT/ML-% IJ SOLN
1450.0000 [IU]/h | INTRAMUSCULAR | Status: DC
  Administered 2013-10-15: 850 [IU]/h via INTRAVENOUS
  Administered 2013-10-16: 1000 [IU]/h via INTRAVENOUS
  Administered 2013-10-16: 1250 [IU]/h via INTRAVENOUS
  Administered 2013-10-17: 1450 [IU]/h via INTRAVENOUS
  Filled 2013-10-15 (×4): qty 250

## 2013-10-15 MED ORDER — WARFARIN - PHARMACIST DOSING INPATIENT
Freq: Every day | Status: DC
  Administered 2013-10-15: 18:00:00

## 2013-10-15 MED ORDER — SIMVASTATIN 40 MG PO TABS
40.0000 mg | ORAL_TABLET | Freq: Every day | ORAL | Status: DC
  Administered 2013-10-15 – 2013-10-16 (×2): 40 mg via ORAL
  Filled 2013-10-15 (×3): qty 1

## 2013-10-15 MED ORDER — VALSARTAN-HYDROCHLOROTHIAZIDE 160-12.5 MG PO TABS: 1.0000 | ORAL_TABLET | Freq: Every day | ORAL | Status: DC

## 2013-10-15 MED ORDER — ASPIRIN 81 MG PO TABS: 81.0000 mg | ORAL_TABLET | Freq: Every day | ORAL | Status: DC

## 2013-10-15 MED ORDER — ASPIRIN EC 81 MG PO TBEC
81.0000 mg | DELAYED_RELEASE_TABLET | Freq: Every day | ORAL | Status: DC
  Administered 2013-10-15 – 2013-10-17 (×3): 81 mg via ORAL
  Filled 2013-10-15 (×3): qty 1

## 2013-10-15 MED ORDER — VITAMIN D3 25 MCG (1000 UNIT) PO TABS
1000.0000 [IU] | ORAL_TABLET | Freq: Every day | ORAL | Status: DC
  Administered 2013-10-15 – 2013-10-17 (×3): 1000 [IU] via ORAL
  Filled 2013-10-15 (×3): qty 1

## 2013-10-15 MED ORDER — CARBAMAZEPINE 200 MG PO TABS
200.0000 mg | ORAL_TABLET | Freq: Every day | ORAL | Status: DC
  Administered 2013-10-15 – 2013-10-16 (×2): 200 mg via ORAL
  Filled 2013-10-15 (×3): qty 1

## 2013-10-15 MED ORDER — IRBESARTAN 150 MG PO TABS
150.0000 mg | ORAL_TABLET | Freq: Every day | ORAL | Status: DC
  Administered 2013-10-15 – 2013-10-17 (×3): 150 mg via ORAL
  Filled 2013-10-15 (×3): qty 1

## 2013-10-15 MED ORDER — OMEPRAZOLE 20 MG PO CPDR
20.0000 mg | DELAYED_RELEASE_CAPSULE | Freq: Every day | ORAL | Status: DC
  Administered 2013-10-16 – 2013-10-17 (×2): 20 mg via ORAL
  Filled 2013-10-15 (×2): qty 1

## 2013-10-15 MED ORDER — NON FORMULARY: 20.0000 mg | Freq: Every morning | Status: DC

## 2013-10-15 MED ORDER — CALCIUM CARBONATE-VITAMIN D 500-200 MG-UNIT PO TABS
1.0000 | ORAL_TABLET | Freq: Every day | ORAL | Status: DC
  Administered 2013-10-15 – 2013-10-17 (×3): 1 via ORAL
  Filled 2013-10-15 (×3): qty 1

## 2013-10-15 MED ORDER — NAPROXEN 250 MG PO TABS
250.0000 mg | ORAL_TABLET | Freq: Two times a day (BID) | ORAL | Status: DC | PRN
  Filled 2013-10-15: qty 1

## 2013-10-15 MED ORDER — NAPROXEN SODIUM 220 MG PO TABS: 220.0000 mg | ORAL_TABLET | Freq: Two times a day (BID) | ORAL | Status: DC | PRN

## 2013-10-15 MED ORDER — VITAMIN D3 25 MCG (1000 UT) PO CAPS: 1000.0000 [IU] | ORAL_CAPSULE | Freq: Every day | ORAL | Status: DC

## 2013-10-15 NOTE — Progress Notes (Signed)
Pt received into room 2w26, pt aware of bedrest till 1300, daughter at bedside, pt has no complaints at this time, pt placed on tele, left groin soft dry and intact, will continue to monitor pt Rickard Rhymes, RN

## 2013-10-15 NOTE — Progress Notes (Signed)
[facility] [place]  Subjective  - Uneventful night, some soreness around sheath   Objective 139/44 64 98.5 F (36.9 C) (Oral) 14 96%  Intake/Output Summary (Last 24 hours) at 10/15/13 0756 Last data filed at 10/15/13 0600  Gross per 24 hour  Intake 2031.5 ml  Output   2275 ml  Net -243.5 ml   Left femoral sheath no hematoma or ooze Both feet pink warm subjectively improved  Assessment/Planning: IR to remove sheath today Transfer 2W after sheath is out Will start heparin later today when ok with IR.  Plan to start coumadin today after heparin started Will check post lysis ABI  FIELDS,CHARLES E 10/15/2013 7:56 AM --  Laboratory Lab Results:  Recent Labs  10/14/13 1100 10/15/13 0449  WBC 8.6 8.1  HGB 10.5* 10.5*  HCT 32.8* 31.4*  PLT 131* 119*   BMET  Recent Labs  10/12/13 1750 10/14/13 0500  NA 138 138  K 4.5 4.6  CL 98 101  CO2 27 26  GLUCOSE 106* 150*  BUN 18 14  CREATININE 0.96 0.85  CALCIUM 10.0 8.7    COAG Lab Results  Component Value Date   INR 1.00 10/12/2013   INR 1.01 04/04/2009   INR 1.0 11/14/2008   No results found for this basename: PTT

## 2013-10-15 NOTE — Progress Notes (Signed)
Subjective: RLE ischemia Arterial thrombolysis 6/21-6/22 Pt has done very well RLE with good sensation and good pulses  Objective: Vital signs in last 24 hours: Temp:  [98.1 F (36.7 C)-100.5 F (38.1 C)] 98.5 F (36.9 C) (06/23 0745) Pulse Rate:  [57-77] 64 (06/23 0700) Resp:  [12-24] 14 (06/23 0700) BP: (109-177)/(33-101) 139/44 mmHg (06/23 0700) SpO2:  [92 %-100 %] 96 % (06/23 0700) Last BM Date: 10/12/13  Intake/Output from previous day: 06/22 0701 - 06/23 0700 In: 2031.5 [P.O.:360; I.V.:1446.5; IV Piggyback:225] Out: 2275 [Urine:2275] Intake/Output this shift:    PE:  tmax last night 100.5 afeb this am Only complaint is lying still on back Rt leg warm; good sensation and movement 2+ pulses Lt groin sheath intact; NT; no hematoma; clean and dry NT to touch 2+ pulses Leg comparison is = Labs wnl Sl drop in plt to 119 (131)  Lab Results:   Recent Labs  10/14/13 1100 10/15/13 0449  WBC 8.6 8.1  HGB 10.5* 10.5*  HCT 32.8* 31.4*  PLT 131* 119*   BMET  Recent Labs  10/12/13 1750 10/14/13 0500  NA 138 138  K 4.5 4.6  CL 98 101  CO2 27 26  GLUCOSE 106* 150*  BUN 18 14  CREATININE 0.96 0.85  CALCIUM 10.0 8.7   PT/INR  Recent Labs  10/12/13 1750  LABPROT 13.0  INR 1.00   ABG No results found for this basename: PHART, PCO2, PO2, HCO3,  in the last 72 hours  Studies/Results: Ir Angiogram Extremity Right  10/13/2013   CLINICAL DATA:  80 year old female with peripheral arterial disease well known to both the vascular surgery and interventional radiology services. She has bilateral femoral to popliteal bypass grafts. She is currently admitted with acute onset of right lower extremity claudication concerning for recurrent thrombosis of her right femoral to below the knee popliteal bypass graft. This graft has thrombosed in the past due to a combination of inflow and outflow disease. The inflow disease was treated with percutaneous angioplasty and  the outflow treated initially by percutaneous angioplasty and ultimately by surgical revision with a 6 mm in-line Gotex jump graft to the below the knee popliteal artery. This performed by Dr. Oneida Alar in December of 2010. Her most recent arteriogram was performed in February of 2013 and demonstrated patency of both grafts with minimal (25%) stenosis at the proximal anastomosis on the right.  EXAM: RIGHT EXTREMITY ARTERIOGRAPHY; ADDITIONAL ARTERIOGRAPHY; IR INFUSION THROMBOL ARTERIAL INITIAL (MS); IR ULTRASOUND GUIDANCE VASC ACCESS LEFT  Date: 10/13/2013  PROCEDURE: 1. Ultrasound-guided puncture of the left common femoral artery 2. Catheterization of the abdominal aorta with distal abdominal and pelvic arteriogram 3. Up in over catheterization of the right common femoral artery 4. Multi station right lower extremity arteriogram 5. Placement of a 90 cm total length 40 cm infusion length Unifuse infusion catheter 6. Initiation of arterial thrombolysis Interventional Radiologist:  Criselda Peaches, MD  ANESTHESIA/SEDATION: Moderate (conscious) sedation was used. Four mg Versed, 137.5 mcg Fentanyl were administered intravenously. The patient's vital signs were monitored continuously by radiology nursing throughout the procedure.  Sedation Time: 80 minutes  FLUOROSCOPY TIME:  10 minutes  CONTRAST:  16mL VISIPAQUE IODIXANOL 320 MG/ML IV SOLN  TECHNIQUE: Informed consent was obtained from the patient following explanation of the procedure, risks, benefits and alternatives. The patient understands, agrees and consents for the procedure. All questions were addressed. A time out was performed.  Maximal barrier sterile technique utilized including caps, mask, sterile gowns, sterile gloves, large  sterile drape, hand hygiene, and Betadine skin prep.  The left groin was interrogated with ultrasound. The common femoral artery was identified and found to be widely patent. And images obtained and stored for the medical record. Local  anesthesia was attained by infiltration with 1% lidocaine. A small dermatotomy was made. Under real-time sonographic guidance, the common femoral artery was punctured with a 21 gauge micropuncture needle. An image was obtained and stored for the medical record.  With the aid of a 4 [place] transitional micro sheath an Amplatz wire was advanced into the common iliac vein. The micro sheath was then exchanged for a working 5 [place] vascular sheath. A glidewire was successfully advanced into the abdominal aorta. There is at least some stenosis of the origin of the left common iliac artery. A pigtail catheter was advanced into the abdominal aorta at the bifurcation and a pelvic arteriogram was performed. There is mild-to-moderate narrowing of the origin of the left common iliac artery. The right common iliac artery is patent. The external and common femoral arteries are widely patent bilaterally. The bilateral profunda femoral arteries are unremarkable.  The pigtail catheter was exchanged for an angled catheter in the angled catheter was advanced into the right common femoral artery. A multi station right common femoral arteriogram was then performed. There is a focal approximately 30- 40% stenosis at the proximal graft anastomosis. Approximately 2 cm beyond the anastomosis the graft fails to opacified with contrast material consistent with thrombosis. There are prominent profunda collaterals which reconstitute the below the knee popliteal artery. The distal graft to popliteal anastomosis appears widely patent. The native by in the knee popliteal artery opacifies via retrograde flow. There is persistent 2 vessel runoff to the ankle from the anterior tibial and peroneal arteries. The posterior tibial artery remains occluded as previously seen.  A Rosen wire was advanced in the right common femoral artery. The 5 [place] vascular sheath was exchanged for a 6 [place] cook Ansel flexor sheath which was advanced up and over the  bifurcation and into the right common femoral artery. Using an angled catheter and Bentson wire, the Bentson wire was advanced into the patent below the knee popliteal artery. A 5 French Unifuse multi side hole infusion catheter (90 cm total length, 50 cm infusion length) was then advanced over the wire and positioned with the proximal side holes just above the thrombosed portion of the graft in the distal sideholes just within the distal aspect of the graft.  The infusion catheter was secured to the 6 French sheath. Sterile dressings were applied. Arterial thrombolysis was initiated at a rate of 0.5 mg/hr TPA. The patient tolerated the procedure well. There was no immediate complication.  COMPLICATIONS: None immediate.  IMPRESSION: 1. 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 There is a mild-moderate (30- 40%) stenosis at the proximal anastomosis. The distal anastomosis appears widely patent. Suspect inflow stenosis may have resulted in slow and or turbulent flow resulting in graft thrombosis. 2. Initiation of arterial thrombolysis at 0.5 mg/hr tenecteplace. 3. Repeat angiogram the morning of 10/14/2013 with possible angioplasty of inflow stenosis. Discuss results of morning angiogram with Dr. Oneida Alar. Signed,  Criselda Peaches, MD  [facility]  [facility]   Electronically Signed   By: Jacqulynn Cadet M.D.   On: 10/13/2013 14:23   Ir Angiogram Selective Each Additional Vessel  10/13/2013   CLINICAL DATA:  80 year old female with peripheral arterial disease well  known to both the vascular surgery and interventional radiology services. She has bilateral femoral to popliteal bypass grafts. She is currently admitted with acute onset of right lower extremity claudication concerning for recurrent thrombosis of her right femoral to below the knee popliteal bypass graft. This graft has thrombosed in the past  due to a combination of inflow and outflow disease. The inflow disease was treated with percutaneous angioplasty and the outflow treated initially by percutaneous angioplasty and ultimately by surgical revision with a 6 mm in-line Gotex jump graft to the below the knee popliteal artery. This performed by Dr. Oneida Alar in December of 2010. Her most recent arteriogram was performed in February of 2013 and demonstrated patency of both grafts with minimal (25%) stenosis at the proximal anastomosis on the right.  EXAM: RIGHT EXTREMITY ARTERIOGRAPHY; ADDITIONAL ARTERIOGRAPHY; IR INFUSION THROMBOL ARTERIAL INITIAL (MS); IR ULTRASOUND GUIDANCE VASC ACCESS LEFT  Date: 10/13/2013  PROCEDURE: 1. Ultrasound-guided puncture of the left common femoral artery 2. Catheterization of the abdominal aorta with distal abdominal and pelvic arteriogram 3. Up in over catheterization of the right common femoral artery 4. Multi station right lower extremity arteriogram 5. Placement of a 90 cm total length 40 cm infusion length Unifuse infusion catheter 6. Initiation of arterial thrombolysis Interventional Radiologist:  Criselda Peaches, MD  ANESTHESIA/SEDATION: Moderate (conscious) sedation was used. Four mg Versed, 137.5 mcg Fentanyl were administered intravenously. The patient's vital signs were monitored continuously by radiology nursing throughout the procedure.  Sedation Time: 80 minutes  FLUOROSCOPY TIME:  10 minutes  CONTRAST:  29mL VISIPAQUE IODIXANOL 320 MG/ML IV SOLN  TECHNIQUE: Informed consent was obtained from the patient following explanation of the procedure, risks, benefits and alternatives. The patient understands, agrees and consents for the procedure. All questions were addressed. A time out was performed.  Maximal barrier sterile technique utilized including caps, mask, sterile gowns, sterile gloves, large sterile drape, hand hygiene, and Betadine skin prep.  The left groin was interrogated with ultrasound. The common  femoral artery was identified and found to be widely patent. And images obtained and stored for the medical record. Local anesthesia was attained by infiltration with 1% lidocaine. A small dermatotomy was made. Under real-time sonographic guidance, the common femoral artery was punctured with a 21 gauge micropuncture needle. An image was obtained and stored for the medical record.  With the aid of a 4 [place] transitional micro sheath an Amplatz wire was advanced into the common iliac vein. The micro sheath was then exchanged for a working 5 [place] vascular sheath. A glidewire was successfully advanced into the abdominal aorta. There is at least some stenosis of the origin of the left common iliac artery. A pigtail catheter was advanced into the abdominal aorta at the bifurcation and a pelvic arteriogram was performed. There is mild-to-moderate narrowing of the origin of the left common iliac artery. The right common iliac artery is patent. The external and common femoral arteries are widely patent bilaterally. The bilateral profunda femoral arteries are unremarkable.  The pigtail catheter was exchanged for an angled catheter in the angled catheter was advanced into the right common femoral artery. A multi station right common femoral arteriogram was then performed. There is a focal approximately 30- 40% stenosis at the proximal graft anastomosis. Approximately 2 cm beyond the anastomosis the graft fails to opacified with contrast material consistent with thrombosis. There are prominent profunda collaterals which reconstitute the below the knee popliteal artery. The distal graft to popliteal anastomosis appears widely patent. The  native by in the knee popliteal artery opacifies via retrograde flow. There is persistent 2 vessel runoff to the ankle from the anterior tibial and peroneal arteries. The posterior tibial artery remains occluded as previously seen.  A Rosen wire was advanced in the right common femoral  artery. The 5 [place] vascular sheath was exchanged for a 6 [place] cook Ansel flexor sheath which was advanced up and over the bifurcation and into the right common femoral artery. Using an angled catheter and Bentson wire, the Bentson wire was advanced into the patent below the knee popliteal artery. A 5 French Unifuse multi side hole infusion catheter (90 cm total length, 50 cm infusion length) was then advanced over the wire and positioned with the proximal side holes just above the thrombosed portion of the graft in the distal sideholes just within the distal aspect of the graft.  The infusion catheter was secured to the 6 French sheath. Sterile dressings were applied. Arterial thrombolysis was initiated at a rate of 0.5 mg/hr TPA. The patient tolerated the procedure well. There was no immediate complication.  COMPLICATIONS: None immediate.  IMPRESSION: 1. 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 There is a mild-moderate (30- 40%) stenosis at the proximal anastomosis. The distal anastomosis appears widely patent. Suspect inflow stenosis may have resulted in slow and or turbulent flow resulting in graft thrombosis. 2. Initiation of arterial thrombolysis at 0.5 mg/hr tenecteplace. 3. Repeat angiogram the morning of 10/14/2013 with possible angioplasty of inflow stenosis. Discuss results of morning angiogram with Dr. Oneida Alar. Signed,  Criselda Peaches, MD  [facility]  [facility] Radiology   Electronically Signed   By: Jacqulynn Cadet M.D.   On: 10/13/2013 14:23   Ir US Guide Vasc Access Left  10/13/2013   CLINICAL DATA:  80 year old female with peripheral arterial disease well known to both the vascular surgery and interventional radiology services. She has bilateral femoral to popliteal bypass grafts. She is currently admitted with acute onset of right lower extremity claudication concerning for recurrent  thrombosis of her right femoral to below the knee popliteal bypass graft. This graft has thrombosed in the past due to a combination of inflow and outflow disease. The inflow disease was treated with percutaneous angioplasty and the outflow treated initially by percutaneous angioplasty and ultimately by surgical revision with a 6 mm in-line Gotex jump graft to the below the knee popliteal artery. This performed by Dr. Oneida Alar in December of 2010. Her most recent arteriogram was performed in February of 2013 and demonstrated patency of both grafts with minimal (25%) stenosis at the proximal anastomosis on the right.  EXAM: RIGHT EXTREMITY ARTERIOGRAPHY; ADDITIONAL ARTERIOGRAPHY; IR INFUSION THROMBOL ARTERIAL INITIAL (MS); IR ULTRASOUND GUIDANCE VASC ACCESS LEFT  Date: 10/13/2013  PROCEDURE: 1. Ultrasound-guided puncture of the left common femoral artery 2. Catheterization of the abdominal aorta with distal abdominal and pelvic arteriogram 3. Up in over catheterization of the right common femoral artery 4. Multi station right lower extremity arteriogram 5. Placement of a 90 cm total length 40 cm infusion length Unifuse infusion catheter 6. Initiation of arterial thrombolysis Interventional Radiologist:  Criselda Peaches, MD  ANESTHESIA/SEDATION: Moderate (conscious) sedation was used. Four mg Versed, 137.5 mcg Fentanyl were administered intravenously. The patient's vital signs were monitored continuously by radiology nursing throughout the procedure.  Sedation Time: 80 minutes  FLUOROSCOPY TIME:  10 minutes  CONTRAST:  21mL VISIPAQUE IODIXANOL 320 MG/ML IV SOLN  TECHNIQUE:  Informed consent was obtained from the patient following explanation of the procedure, risks, benefits and alternatives. The patient understands, agrees and consents for the procedure. All questions were addressed. A time out was performed.  Maximal barrier sterile technique utilized including caps, mask, sterile gowns, sterile gloves, large  sterile drape, hand hygiene, and Betadine skin prep.  The left groin was interrogated with ultrasound. The common femoral artery was identified and found to be widely patent. And images obtained and stored for the medical record. Local anesthesia was attained by infiltration with 1% lidocaine. A small dermatotomy was made. Under real-time sonographic guidance, the common femoral artery was punctured with a 21 gauge micropuncture needle. An image was obtained and stored for the medical record.  With the aid of a 4 [place] transitional micro sheath an Amplatz wire was advanced into the common iliac vein. The micro sheath was then exchanged for a working 5 [place] vascular sheath. A glidewire was successfully advanced into the abdominal aorta. There is at least some stenosis of the origin of the left common iliac artery. A pigtail catheter was advanced into the abdominal aorta at the bifurcation and a pelvic arteriogram was performed. There is mild-to-moderate narrowing of the origin of the left common iliac artery. The right common iliac artery is patent. The external and common femoral arteries are widely patent bilaterally. The bilateral profunda femoral arteries are unremarkable.  The pigtail catheter was exchanged for an angled catheter in the angled catheter was advanced into the right common femoral artery. A multi station right common femoral arteriogram was then performed. There is a focal approximately 30- 40% stenosis at the proximal graft anastomosis. Approximately 2 cm beyond the anastomosis the graft fails to opacified with contrast material consistent with thrombosis. There are prominent profunda collaterals which reconstitute the below the knee popliteal artery. The distal graft to popliteal anastomosis appears widely patent. The native by in the knee popliteal artery opacifies via retrograde flow. There is persistent 2 vessel runoff to the ankle from the anterior tibial and peroneal arteries. The  posterior tibial artery remains occluded as previously seen.  A Rosen wire was advanced in the right common femoral artery. The 5 [place] vascular sheath was exchanged for a 6 [place] cook Ansel flexor sheath which was advanced up and over the bifurcation and into the right common femoral artery. Using an angled catheter and Bentson wire, the Bentson wire was advanced into the patent below the knee popliteal artery. A 5 French Unifuse multi side hole infusion catheter (90 cm total length, 50 cm infusion length) was then advanced over the wire and positioned with the proximal side holes just above the thrombosed portion of the graft in the distal sideholes just within the distal aspect of the graft.  The infusion catheter was secured to the 6 French sheath. Sterile dressings were applied. Arterial thrombolysis was initiated at a rate of 0.5 mg/hr TPA. The patient tolerated the procedure well. There was no immediate complication.  COMPLICATIONS: None immediate.  IMPRESSION: 1. 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 There is a mild-moderate (30- 40%) stenosis at the proximal anastomosis. The distal anastomosis appears widely patent. Suspect inflow stenosis may have resulted in slow and or turbulent flow resulting in graft thrombosis. 2. Initiation of arterial thrombolysis at 0.5 mg/hr tenecteplace. 3. Repeat angiogram the morning of 10/14/2013 with possible angioplasty of inflow stenosis. Discuss results of morning angiogram with Dr. Oneida Alar. Signed,  Criselda Peaches,  MD  [facility]  [facility] Radiology   Electronically Signed   By: Jacqulynn Cadet M.D.   On: 10/13/2013 14:23   Ir Infusion Thrombol Arterial Initial (ms)  10/13/2013   CLINICAL DATA:  80 year old female with peripheral arterial disease well known to both the vascular surgery and interventional radiology services. She has bilateral femoral to  popliteal bypass grafts. She is currently admitted with acute onset of right lower extremity claudication concerning for recurrent thrombosis of her right femoral to below the knee popliteal bypass graft. This graft has thrombosed in the past due to a combination of inflow and outflow disease. The inflow disease was treated with percutaneous angioplasty and the outflow treated initially by percutaneous angioplasty and ultimately by surgical revision with a 6 mm in-line Gotex jump graft to the below the knee popliteal artery. This performed by Dr. Oneida Alar in December of 2010. Her most recent arteriogram was performed in February of 2013 and demonstrated patency of both grafts with minimal (25%) stenosis at the proximal anastomosis on the right.  EXAM: RIGHT EXTREMITY ARTERIOGRAPHY; ADDITIONAL ARTERIOGRAPHY; IR INFUSION THROMBOL ARTERIAL INITIAL (MS); IR ULTRASOUND GUIDANCE VASC ACCESS LEFT  Date: 10/13/2013  PROCEDURE: 1. Ultrasound-guided puncture of the left common femoral artery 2. Catheterization of the abdominal aorta with distal abdominal and pelvic arteriogram 3. Up in over catheterization of the right common femoral artery 4. Multi station right lower extremity arteriogram 5. Placement of a 90 cm total length 40 cm infusion length Unifuse infusion catheter 6. Initiation of arterial thrombolysis Interventional Radiologist:  Criselda Peaches, MD  ANESTHESIA/SEDATION: Moderate (conscious) sedation was used. Four mg Versed, 137.5 mcg Fentanyl were administered intravenously. The patient's vital signs were monitored continuously by radiology nursing throughout the procedure.  Sedation Time: 80 minutes  FLUOROSCOPY TIME:  10 minutes  CONTRAST:  36mL VISIPAQUE IODIXANOL 320 MG/ML IV SOLN  TECHNIQUE: Informed consent was obtained from the patient following explanation of the procedure, risks, benefits and alternatives. The patient understands, agrees and consents for the procedure. All questions were addressed. A  time out was performed.  Maximal barrier sterile technique utilized including caps, mask, sterile gowns, sterile gloves, large sterile drape, hand hygiene, and Betadine skin prep.  The left groin was interrogated with ultrasound. The common femoral artery was identified and found to be widely patent. And images obtained and stored for the medical record. Local anesthesia was attained by infiltration with 1% lidocaine. A small dermatotomy was made. Under real-time sonographic guidance, the common femoral artery was punctured with a 21 gauge micropuncture needle. An image was obtained and stored for the medical record.  With the aid of a 4 [place] transitional micro sheath an Amplatz wire was advanced into the common iliac vein. The micro sheath was then exchanged for a working 5 [place] vascular sheath. A glidewire was successfully advanced into the abdominal aorta. There is at least some stenosis of the origin of the left common iliac artery. A pigtail catheter was advanced into the abdominal aorta at the bifurcation and a pelvic arteriogram was performed. There is mild-to-moderate narrowing of the origin of the left common iliac artery. The right common iliac artery is patent. The external and common femoral arteries are widely patent bilaterally. The bilateral profunda femoral arteries are unremarkable.  The pigtail catheter was exchanged for an angled catheter in the angled catheter was advanced into the right common femoral artery. A multi station right common femoral arteriogram was then performed. There is a focal approximately  30- 40% stenosis at the proximal graft anastomosis. Approximately 2 cm beyond the anastomosis the graft fails to opacified with contrast material consistent with thrombosis. There are prominent profunda collaterals which reconstitute the below the knee popliteal artery. The distal graft to popliteal anastomosis appears widely patent. The native by in the knee popliteal artery opacifies  via retrograde flow. There is persistent 2 vessel runoff to the ankle from the anterior tibial and peroneal arteries. The posterior tibial artery remains occluded as previously seen.  A Rosen wire was advanced in the right common femoral artery. The 5 [place] vascular sheath was exchanged for a 6 [place] cook Ansel flexor sheath which was advanced up and over the bifurcation and into the right common femoral artery. Using an angled catheter and Bentson wire, the Bentson wire was advanced into the patent below the knee popliteal artery. A 5 French Unifuse multi side hole infusion catheter (90 cm total length, 50 cm infusion length) was then advanced over the wire and positioned with the proximal side holes just above the thrombosed portion of the graft in the distal sideholes just within the distal aspect of the graft.  The infusion catheter was secured to the 6 French sheath. Sterile dressings were applied. Arterial thrombolysis was initiated at a rate of 0.5 mg/hr TPA. The patient tolerated the procedure well. There was no immediate complication.  COMPLICATIONS: None immediate.  IMPRESSION: 1. 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 There is a mild-moderate (30- 40%) stenosis at the proximal anastomosis. The distal anastomosis appears widely patent. Suspect inflow stenosis may have resulted in slow and or turbulent flow resulting in graft thrombosis. 2. Initiation of arterial thrombolysis at 0.5 mg/hr tenecteplace. 3. Repeat angiogram the morning of 10/14/2013 with possible angioplasty of inflow stenosis. Discuss results of morning angiogram with Dr. Oneida Alar. Signed,  Criselda Peaches, MD  [facility]  [facility]   Electronically Signed   By: Jacqulynn Cadet M.D.   On: 10/13/2013 14:23    Anti-infectives: Anti-infectives   None      Assessment/Plan: s/p * No surgery found *  RLE  ischemia Arterial Lysis 6/21-6/22 Pt has done well Lt groin sheath still intact For removal today Plan per Dr Early   LOS: 3 days    TURPIN,PAMELA A 10/15/2013

## 2013-10-15 NOTE — Progress Notes (Signed)
Left femoral artery 66fr Sheath removed after placing 46fr exoseal closure device. Hemostasis archived after holding pressure  on access site for 5 minutes. Site dressed with gauze and Tegaderm. Distal pulses check and present. Tillman Sers and [place].

## 2013-10-15 NOTE — Progress Notes (Signed)
[facility] for Heparin and coumadin Indication: DVT/PAD, s/p catheter directed lysis  Allergies  Allergen Reactions  . Sulfa Antibiotics Other (See Comments)    Cold sweat light headed and disorientation  . Tiotropium Bromide Shortness Of Breath and Other (See Comments)    Sore throat also  . Latex Itching and Rash    Patient Measurements: Height: 5\' 2"  (157.5 cm) Weight: 174 lb 6.1 oz (79.1 kg) IBW/kg (Calculated) : 50.1  Vital Signs: Temp: 98.2 F (36.8 C) (06/23 1157) Temp src: Oral (06/23 1157) BP: 144/54 mmHg (06/23 1157) Pulse Rate: 65 (06/23 1157)  Labs:  Recent Labs  10/12/13 1750  10/14/13 0500 10/14/13 1100 10/14/13 1350 10/15/13 0449  HGB 12.8  < > 11.2* 10.5*  --  10.5*  HCT 37.3  < > 34.6* 32.8*  --  31.4*  PLT 175  < > 147* 131*  --  119*  APTT 29  --   --   --   --   --   LABPROT 13.0  --   --   --   --   --   INR 1.00  --   --   --   --   --   HEPARINUNFRC  --   < > 0.34 1.08* 0.39  --   CREATININE 0.96  --  0.85  --   --   --   < > = values in this interval not displayed.  Estimated Creatinine Clearance: 46.3 ml/min (by C-G formula based on Cr of 0.85).   Assessment: 30 yof  s/p  Arterial thrombolysis of RLE 6/21-6/22 with TNKase and IV heparin.   Complete resolution of clot in her right femoral to below-knee popliteal bypass.  Femoral sheath removed this morning in IR. Per Dr. Reesa Chew: start heparin with no bolus at 1400 today. She was previously therapeutic with heparin drip at 850 units/hr and TNKase at 0.25 mg/hr.  TNKase off since 6/22 PM.  Hg stable at 10.5, plct low at 119.  Coumadin score = 3.  Drug Interactions: aspirin 81 mg and prn naprosyn increase risk of GI bleed.  Carbemazepine may decrease the hypothrombinemic effect of warfarin.   Goal of Therapy:  Heparin level 0.3-0.7 units/ml Monitor platelets by anticoagulation protocol: Yes INR 2-3   Plan:  1. Restart heparin at 850 units/hr with no  bolus today at 1400 (per Dr Reesa Chew) and check 8 hr heparin level at 2200 2. Coumadin 5 mg po x 1 dose today 3. Coumadin book for education (video system down) 4. Daily HL, CBC, INR Eudelia Bunch, Pharm.D. [phone] 10/15/2013 12:34 PM

## 2013-10-15 NOTE — Progress Notes (Signed)
Patient transferred with belongings, medications, and chart to room [place] [address] with nurse. Attached to monitor and new nurse at bedside. No new problems at this time. Sandre Kitty

## 2013-10-16 LAB — CBC
HCT: 32.5 % — ABNORMAL LOW (ref 36.0–46.0)
HEMOGLOBIN: 10.9 g/dL — AB (ref 12.0–15.0)
MCH: 28.4 pg (ref 26.0–34.0)
MCHC: 33.5 g/dL (ref 30.0–36.0)
MCV: 84.6 fL (ref 78.0–100.0)
Platelets: 107 10*3/uL — ABNORMAL LOW (ref 150–400)
RBC: 3.84 MIL/uL — AB (ref 3.87–5.11)
RDW: 13.2 % (ref 11.5–15.5)
WBC: 10.4 10*3/uL (ref 4.0–10.5)

## 2013-10-16 LAB — PROTIME-INR
INR: 1.08 (ref 0.00–1.49)
Prothrombin Time: 13.8 seconds (ref 11.6–15.2)

## 2013-10-16 LAB — HEPARIN LEVEL (UNFRACTIONATED)
Heparin Unfractionated: <0.1 IU/mL — ABNORMAL LOW (ref 0.30–0.70)
Heparin Unfractionated: 0.25 IU/mL — ABNORMAL LOW (ref 0.30–0.70)

## 2013-10-16 MED ORDER — WARFARIN SODIUM 5 MG PO TABS
5.0000 mg | ORAL_TABLET | Freq: Once | ORAL | Status: AC
  Administered 2013-10-16: 5 mg via ORAL
  Filled 2013-10-16: qty 1

## 2013-10-16 MED ORDER — HEPARIN BOLUS VIA INFUSION
2000.0000 [IU] | Freq: Once | INTRAVENOUS | Status: AC
  Administered 2013-10-16: 2000 [IU] via INTRAVENOUS
  Filled 2013-10-16: qty 2000

## 2013-10-16 NOTE — Discharge Instructions (Signed)
Information on my medicine - Coumadin®   (Warfarin) ° °This medication education was reviewed with me or my healthcare representative as part of my discharge preparation.  The pharmacist that spoke with me during my hospital stay was:  Yuritzi Kamp T, RPH ° °Why was Coumadin prescribed for you? °Coumadin was prescribed for you because you have a blood clot or a medical condition that can cause an increased risk of forming blood clots. Blood clots can cause serious health problems by blocking the flow of blood to the heart, lung, or brain. Coumadin can prevent harmful blood clots from forming. °As a reminder your indication for Coumadin is:   Deep Vein Thrombosis Treatment ° °What test will check on my response to Coumadin? °While on Coumadin (warfarin) you will need to have an INR test regularly to ensure that your dose is keeping you in the desired range. The INR (international normalized ratio) number is calculated from the result of the laboratory test called prothrombin time (PT). ° °If an INR APPOINTMENT HAS NOT ALREADY BEEN MADE FOR YOU please schedule an appointment to have this lab work done by your health care provider within 7 days. °Your INR goal is usually a number between:  2 to 3 or your provider may give you a more narrow range like 2-2.5.  Ask your health care provider during an office visit what your goal INR is. ° °What  do you need to  know  About  COUMADIN? °Take Coumadin (warfarin) exactly as prescribed by your healthcare provider about the same time each day.  DO NOT stop taking without talking to the doctor who prescribed the medication.  Stopping without other blood clot prevention medication to take the place of Coumadin may increase your risk of developing a new clot or stroke.  Get refills before you run out. ° °What do you do if you miss a dose? °If you miss a dose, take it as soon as you remember on the same day then continue your regularly scheduled regimen the next day.  Do not take  two doses of Coumadin at the same time. ° °Important Safety Information °A possible side effect of Coumadin (Warfarin) is an increased risk of bleeding. You should call your healthcare provider right away if you experience any of the following: °? Bleeding from an injury or your nose that does not stop. °? Unusual colored urine (red or dark brown) or unusual colored stools (red or black). °? Unusual bruising for unknown reasons. °? A serious fall or if you hit your head (even if there is no bleeding). ° °Some foods or medicines interact with Coumadin® (warfarin) and might alter your response to warfarin. To help avoid this: °? Eat a balanced diet, maintaining a consistent amount of Vitamin K. °? Notify your provider about major diet changes you plan to make. °? Avoid alcohol or limit your intake to 1 drink for women and 2 drinks for men per day. °(1 drink is 5 oz. wine, 12 oz. beer, or 1.5 oz. liquor.) ° °Make sure that ANY health care provider who prescribes medication for you knows that you are taking Coumadin (warfarin).  Also make sure the healthcare provider who is monitoring your Coumadin knows when you have started a new medication including herbals and non-prescription products. ° °Coumadin® (Warfarin)  Major Drug Interactions  °Increased Warfarin Effect Decreased Warfarin Effect  °Alcohol (large quantities) °Antibiotics (esp. Septra/Bactrim, Flagyl, Cipro) °Amiodarone (Cordarone) °Aspirin (ASA) °Cimetidine (Tagamet) °Megestrol (Megace) °NSAIDs (ibuprofen, naproxen, etc.) °  Tagamet) Megestrol (Megace) NSAIDs (ibuprofen, naproxen, etc.) Piroxicam (Feldene) Propafenone (Rythmol SR) Propranolol (Inderal) Isoniazid (INH) Posaconazole (Noxafil) Barbiturates (Phenobarbital) Carbamazepine (Tegretol) Chlordiazepoxide (Librium) Cholestyramine (Questran) Griseofulvin Oral Contraceptives Rifampin Sucralfate (Carafate) Vitamin K   Coumadin (Warfarin) Major Herbal Interactions  Increased Warfarin Effect Decreased Warfarin Effect  Garlic Ginseng Ginkgo biloba  Coenzyme Q10 Green tea St. Johns wort    Coumadin (Warfarin) FOOD Interactions  Eat a consistent number of servings per week of foods HIGH in Vitamin K (1 serving =  cup)  Collards (cooked, or boiled & drained) Kale (cooked, or boiled & drained) Mustard greens (cooked, or boiled & drained) Parsley *serving size only =  cup Spinach (cooked, or boiled & drained) Swiss chard (cooked, or boiled & drained) Turnip greens (cooked, or boiled & drained)  Eat a consistent number of servings per week of foods MEDIUM-HIGH in Vitamin K (1 serving = 1 cup)  Asparagus (cooked, or boiled & drained) Broccoli (cooked, boiled & drained, or raw & chopped) Brussel sprouts (cooked, or boiled & drained) *serving size only =  cup Lettuce, raw (green leaf, endive, romaine) Spinach, raw Turnip greens, raw & chopped   These websites have more information on Coumadin (warfarin):  FailFactory.se; VeganReport.com.au;  Information on my medicine - Coumadin   (Warfarin)  This medication education was reviewed with me or my healthcare representative as part of my discharge preparation.  The pharmacist that spoke with me during my hospital stay was:  Eudelia Bunch, [facility]  Why was Coumadin prescribed for you? Coumadin was prescribed for you because you have a blood clot or a medical condition that can cause an increased risk of forming blood clots. Blood clots can cause serious health problems by blocking the flow of blood to the heart, lung, or brain. Coumadin can prevent harmful blood clots from forming. As a reminder your indication for Coumadin is:   Select from menu  What test will check on my response to Coumadin? While on Coumadin (warfarin) you will need to have an INR test regularly to ensure that your dose is keeping you in the desired range. The INR (international normalized ratio) number is calculated from the result of the laboratory test called prothrombin time (PT).  If an  INR APPOINTMENT HAS NOT ALREADY BEEN MADE FOR YOU please schedule an appointment to have this lab work done by your health care provider within 7 days. Your INR goal is usually a number between:  2 to 3 or your provider may give you a more narrow range like 2-2.5.  Ask your health care provider during an office visit what your goal INR is.  What  do you need to  know  About  COUMADIN? Take Coumadin (warfarin) exactly as prescribed by your healthcare provider about the same time each day.  DO NOT stop taking without talking to the doctor who prescribed the medication.  Stopping without other blood clot prevention medication to take the place of Coumadin may increase your risk of developing a new clot or stroke.  Get refills before you run out.  What do you do if you miss a dose? If you miss a dose, take it as soon as you remember on the same day then continue your regularly scheduled regimen the next day.  Do not take two doses of Coumadin at the same time.  Important Safety Information A possible side effect of Coumadin (Warfarin) is an increased risk of bleeding. You should call your healthcare provider right away if you  experience any of the following:   Bleeding from an injury or your nose that does not stop.   Unusual colored urine (red or dark brown) or unusual colored stools (red or black).   Unusual bruising for unknown reasons.   A serious fall or if you hit your head (even if there is no bleeding).  Some foods or medicines interact with Coumadin (warfarin) and might alter your response to warfarin. To help avoid this:   Eat a balanced diet, maintaining a consistent amount of Vitamin K.   Notify your provider about major diet changes you plan to make.   Avoid alcohol or limit your intake to 1 drink for women and 2 drinks for men per day. (1 drink is 5 oz. wine, 12 oz. beer, or 1.5 oz. liquor.)  Make sure that ANY health care provider who prescribes medication for you knows that you are  taking Coumadin (warfarin).  Also make sure the healthcare provider who is monitoring your Coumadin knows when you have started a new medication including herbals and non-prescription products.  Coumadin (Warfarin)  Major Drug Interactions  Increased Warfarin Effect Decreased Warfarin Effect  Alcohol (large quantities) Antibiotics (esp. Septra/Bactrim, Flagyl, Cipro) Amiodarone (Cordarone) Aspirin (ASA) Cimetidine (Tagamet) Megestrol (Megace) NSAIDs (ibuprofen, naproxen, etc.) Piroxicam (Feldene) Propafenone (Rythmol SR) Propranolol (Inderal) Isoniazid (INH) Posaconazole (Noxafil) Barbiturates (Phenobarbital) Carbamazepine (Tegretol) Chlordiazepoxide (Librium) Cholestyramine (Questran) Griseofulvin Oral Contraceptives Rifampin Sucralfate (Carafate) Vitamin K   Coumadin (Warfarin) Major Herbal Interactions  Increased Warfarin Effect Decreased Warfarin Effect  Garlic Ginseng Ginkgo biloba Coenzyme Q10 Green tea St. Johns wort    Coumadin (Warfarin) FOOD Interactions  Eat a consistent number of servings per week of foods HIGH in Vitamin K (1 serving =  cup)  Collards (cooked, or boiled & drained) Kale (cooked, or boiled & drained) Mustard greens (cooked, or boiled & drained) Parsley *serving size only =  cup Spinach (cooked, or boiled & drained) Swiss chard (cooked, or boiled & drained) Turnip greens (cooked, or boiled & drained)  Eat a consistent number of servings per week of foods MEDIUM-HIGH in Vitamin K (1 serving = 1 cup)  Asparagus (cooked, or boiled & drained) Broccoli (cooked, boiled & drained, or raw & chopped) Brussel sprouts (cooked, or boiled & drained) *serving size only =  cup Lettuce, raw (green leaf, endive, romaine) Spinach, raw Turnip greens, raw & chopped   These websites have more information on Coumadin (warfarin):  FailFactory.se; VeganReport.com.au;

## 2013-10-16 NOTE — Care Management Note (Signed)
    Page 1 of 2   10/17/2013     4:05:22 PM CARE MANAGEMENT NOTE 10/17/2013  Patient:  [name] [name], [name] [name]   Account Number:  [id number]  Date Initiated:  10/16/2013  Documentation initiated by:  AMERSON,JULIE  Subjective/Objective Assessment:   Pt adm with RLE ischemia s/p arterial thrombolysis.  PTA, pt independent, lives alone.     Action/Plan:   PT eval today; recommending HHPT follow up, RW for home. MD please order, CM will arrange services.   Anticipated DC Date:  10/17/2013   Anticipated DC Plan:  [place]  CM consult      [facility] Choice  HOME HEALTH   Choice offered to / List presented to:  C-1 Patient   DME arranged  Vassie Moselle      DME agency  [place] arranged  HH-2 PT      [place]   Status of service:  Completed, signed off Medicare Important Message given?  YES (If response is "NO", the following Medicare IM given date fields will be blank) Date Medicare IM given:  10/16/2013 Date Additional Medicare IM given:    Discharge Disposition:  [place]  Per UR Regulation:  Reviewed for med. necessity/level of care/duration of stay  If discussed at [place] of Stay Meetings, dates discussed:   10/17/2013    Comments:  10/17/13 Ellan Lambert, RN, BSN [phone] Pt for dc home today.  Needs HH follow up; referral to [facility], per pt choice.  Start of care 24-48h post dc date. Pt provided with RW for home by [facility] prior to dc home.

## 2013-10-16 NOTE — Progress Notes (Signed)
ANTICOAGULATION CONSULT NOTE - Follow Up Consult  Pharmacy Consult for heparin Indication: DVT/PAD, s/p TNKase (off on 06/22)  Allergies  Allergen Reactions  . Sulfa Antibiotics Other (See Comments)    Cold sweat light headed and disorientation  . Tiotropium Bromide Shortness Of Breath and Other (See Comments)    Sore throat also  . Latex Itching and Rash    Patient Measurements: Height: 5\' 2"  (157.5 cm) Weight: 174 lb 6.1 oz (79.1 kg) IBW/kg (Calculated) : 50.1 Heparin Dosing Weight: 67.6 kg  Vital Signs: Temp: 98.7 F (37.1 C) (06/24 1923) Temp src: Oral (06/24 1923) BP: 136/55 mmHg (06/24 1923) Pulse Rate: 66 (06/24 1923)  Labs:  Recent Labs  10/14/13 0500 10/14/13 1100  10/15/13 0449 10/15/13 2143 10/16/13 0305 10/16/13 1012 10/16/13 2204  HGB 11.2* 10.5*  --  10.5*  --  10.9*  --   --   HCT 34.6* 32.8*  --  31.4*  --  32.5*  --   --   PLT 147* 131*  --  119*  --  107*  --   --   LABPROT  --   --   --   --   --  13.8  --   --   INR  --   --   --   --   --  1.08  --   --   HEPARINUNFRC 0.34 1.08*  < >  --  <0.10*  --  <0.10* 0.25*  CREATININE 0.85  --   --   --   --   --   --   --   < > = values in this interval not displayed.  Estimated Creatinine Clearance: 46.3 ml/min (by C-G formula based on Cr of 0.85).   Medications:  Scheduled:  . aspirin EC  81 mg Oral Daily  . calcium-vitamin D  1 tablet Oral Daily  . carbamazepine  200 mg Oral QHS  . cholecalciferol  1,000 Units Oral Daily  . hydrochlorothiazide  12.5 mg Oral Daily  . irbesartan  150 mg Oral Daily  . omeprazole  20 mg Oral Daily  . potassium chloride  20-40 mEq Oral Once  . simvastatin  40 mg Oral QHS  . sodium chloride  3 mL Intravenous Q12H  . Warfarin - Pharmacist Dosing Inpatient   Does not apply q1800   Infusions:  . dextrose 5 % and 0.45 % NaCl with KCl 20 mEq/L 50 mL/hr at 10/15/13 1158  . heparin 1,250 Units/hr (10/16/13 1350)    Assessment: 80 yo female with DVT/PAD, s/p  TNKase which was stopped on 06/22, is currently on subtherapeutic heparin.  Heparin level is 0.25.  Goal of Therapy:  Heparin level 0.3-0.7 units/ml Monitor platelets by anticoagulation protocol: Yes   Plan:  1) Increase heparin drip to 1350 units/hr. Check 8hr heparin level  So, Tsz-Yin 10/16/2013,10:46 PM

## 2013-10-16 NOTE — Progress Notes (Signed)
ANTICOAGULATION CONSULT NOTE - Follow Up Consult  Pharmacy Consult for Heparin  Indication: DVT/PAD, s/p TNKase (stopped 6/22)  Allergies  Allergen Reactions  . Sulfa Antibiotics Other (See Comments)    Cold sweat light headed and disorientation  . Tiotropium Bromide Shortness Of Breath and Other (See Comments)    Sore throat also  . Latex Itching and Rash    Patient Measurements: Height: 5\' 2"  (157.5 cm) Weight: 174 lb 6.1 oz (79.1 kg) IBW/kg (Calculated) : 50.1  Vital Signs: Temp: 98.7 F (37.1 C) (06/24 0425) Temp src: Oral (06/24 0425) BP: 123/46 mmHg (06/24 0425) Pulse Rate: 67 (06/24 0425)  Labs:  Recent Labs  10/14/13 0500 10/14/13 1100 10/14/13 1350 10/15/13 0449 10/15/13 2143 10/16/13 0305 10/16/13 1012  HGB 11.2* 10.5*  --  10.5*  --  10.9*  --   HCT 34.6* 32.8*  --  31.4*  --  32.5*  --   PLT 147* 131*  --  119*  --  107*  --   LABPROT  --   --   --   --   --  13.8  --   INR  --   --   --   --   --  1.08  --   HEPARINUNFRC 0.34 1.08* 0.39  --  <0.10*  --  <0.10*  CREATININE 0.85  --   --   --   --   --   --     Estimated Creatinine Clearance: 46.3 ml/min (by C-G formula based on Cr of 0.85).   Medications:  Heparin 1000 units/hr  Assessment: 80 y/o F s/p thrombolysis from 6/21-6/22 with TNKase and heparin with complete resolution of clot in her right femoral to below-knee popliteal bypass. Heparin restarted 6/23, first HL after re-start is <0.1 and rate increased to 1000 units/hr. HL drawn after rate increase still <0.1.  IV site inspected by RN and myself and seems to be infusing well.   She does have a hematoma at IV site from insertion.  Hg stable at 10.9, pltc low at 107. PLTC 175>143>131>119>107.    No overt bleeding reported.  INR still baseline at 1.08 after first dose of coumadin 5 mg as expected.   Drug Interactions: aspirin 81 mg and prn naprosyn increase risk of GI bleed.  Carbemazepine may decrease the hypothrombinemic effect of warfarin.     Goal of Therapy:  Heparin level 0.3-0.7 units/ml Monitor platelets by anticoagulation protocol: Yes   Plan:  Heparin bolus 2000 units and increase drip rate to 1250 units/hr.  Rn to flush IV site again as well. Check 8 hr HL Coumadin 5 mg po x 1 dose today Daily HL, CBC, INR  Eudelia Bunch, Pharm.D. [phone] 10/16/2013 1:24 PM

## 2013-10-16 NOTE — Progress Notes (Addendum)
  [facility] Progress Note  10/16/2013 1:46 PM   Subjective:  Pt wants to work with physical therapy for assistance with ambulation. Feels that legs are weak. Not ready to go home today.   Filed Vitals:   10/16/13 0425  BP: 123/46  Pulse: 67  Temp: 98.7 F (37.1 C)  Resp: 18    Physical Exam: Incisions:  No bleeding or hematoma around left femoral sheath site.  Extremities:  Palpable DP pulses bilaterally.   CBC    Component Value Date/Time   WBC 10.4 10/16/2013 0305   RBC 3.84* 10/16/2013 0305   HGB 10.9* 10/16/2013 0305   HCT 32.5* 10/16/2013 0305   PLT 107* 10/16/2013 0305   MCV 84.6 10/16/2013 0305   MCH 28.4 10/16/2013 0305   MCHC 33.5 10/16/2013 0305   RDW 13.2 10/16/2013 0305   LYMPHSABS 2.4 12/31/2012 1419   MONOABS 0.6 12/31/2012 1419   EOSABS 0.1 12/31/2012 1419   BASOSABS 0.0 12/31/2012 1419    BMET    Component Value Date/Time   NA 138 10/14/2013 0500   K 4.6 10/14/2013 0500   CL 101 10/14/2013 0500   CO2 26 10/14/2013 0500   GLUCOSE 150* 10/14/2013 0500   BUN 14 10/14/2013 0500   CREATININE 0.85 10/14/2013 0500   CALCIUM 8.7 10/14/2013 0500   GFRNONAA 60* 10/14/2013 0500   GFRAA 70* 10/14/2013 0500    INR    Component Value Date/Time   INR 1.08 10/16/2013 0305     Intake/Output Summary (Last 24 hours) at 10/16/13 1346 Last data filed at 10/16/13 0844  Gross per 24 hour  Intake    480 ml  Output   1800 ml  Net  -1320 ml     Assessment:  80 y.o. female with right femoral popliteal bypass occlusion s/p IR lysis   Plan: -No pain or bleeding around incision site.  -Patient does not feel ready to go home today. Wants physical therapy to evaluate.  -Patient started on coumadin and heparin drip. Discussed following up with PCP to check INR on Monday.  -D/C foley -Dispo: Anticipate discharge home tomorrow.    Virgina Jock, PA-C [facility] Office: [phone] Pager: [phone] 10/16/2013 1:46 PM   Some  deconditioning 2+ DP bilat Continue heparin coumadin Probable d/c tomorrow Needs rolling walker for home  [name], MD [facility] [place]: [phone] Pager: [phone]

## 2013-10-16 NOTE — Evaluation (Signed)
Physical Therapy Evaluation Patient Details Name: [name] [name] MRN: [id number] DOB: May 07, 1924 Today's Date: 10/16/2013   History of Present Illness  80 y/o WF s/p RLE ischemia with arterial thrombolysis 6/21-6/22.  Clinical Impression  Pt admitted with above. Pt currently with functional limitations due to the deficits listed below (see PT Problem List).  Pt will benefit from skilled PT to increase their independence and safety with mobility to allow discharge to the venue listed below. Pt would benefit from HHPT and short term use of RW while she recovers from recent bedrest and increases her strength.  Pt is in agreement.     Follow Up Recommendations Home health PT    Equipment Recommendations  Rolling walker with 5" wheels    Recommendations for Other Services       Precautions / Restrictions Precautions Precautions: None      Mobility  Bed Mobility               General bed mobility comments: Pt up in recliner upon arrival  Transfers Overall transfer level: Modified independent                  Ambulation/Gait Ambulation/Gait assistance: Min guard;Supervision Ambulation Distance (Feet): 80 Feet Assistive device: Rolling walker (2 wheeled) Gait Pattern/deviations: Decreased step length - right;Decreased step length - left;Antalgic Gait velocity: decreased   General Gait Details: Pt reports she just feels stiff and weak from having to lay still on her back for days.  Stairs            Wheelchair Mobility    Modified Rankin (Stroke Patients Only)       Balance Overall balance assessment: No apparent balance deficits (not formally assessed)                                           Pertinent Vitals/Pain Soreness in back and stiffness in legs due to laying in bed for days    Home Living Family/patient expects to be discharged to:: Private residence Living Arrangements: Alone   Type of Home: House Home Access:  Stairs to enter   [employer] of Steps: 3 small steps  but they are not all together. sidewalks in between [place]: One level Home Equipment: Shower seat      Prior Function Level of Independence: Independent         Comments: Works at [employer], volunteers at [employer] patients, volunteers with church     Hand Dominance        Extremity/Trunk Assessment   Upper Extremity Assessment: Overall WFL for tasks assessed           Lower Extremity Assessment: Overall WFL for tasks assessed (c/o "stiffness")      Cervical / Trunk Assessment: Normal  Communication   Communication: No difficulties  Cognition Arousal/Alertness: Awake/alert Behavior During Therapy: WFL for tasks assessed/performed Overall Cognitive Status: Within Functional Limits for tasks assessed                      General Comments      Exercises Other Exercises Other Exercises: Discussed general LE ther ex to decrease stiffness and for circulation      Assessment/Plan    PT Assessment Patient needs continued PT services  PT Diagnosis Difficulty walking   PT Problem List Decreased activity tolerance;Decreased balance;Decreased mobility  PT Treatment Interventions Gait training;Stair training;Functional mobility training;Therapeutic exercise;Therapeutic activities   PT Goals (Current goals can be found in the Care Plan section) Acute Rehab PT Goals Patient Stated Goal: Go home and get her strength back so she can get back to work and volunteering. PT Goal Formulation: With patient Time For Goal Achievement: 10/23/13 Potential to Achieve Goals: Good    Frequency Min 3X/week   Barriers to discharge        Co-evaluation               End of Session Equipment Utilized During Treatment: Gait belt Activity Tolerance: Patient tolerated treatment well Patient left: in chair Nurse Communication: Mobility status         Time:  1610-9604 PT Time Calculation (min): 27 min   Charges:   PT Evaluation $Initial PT Evaluation Tier I: 1 Procedure PT Treatments $Gait Training: 23-37 mins   PT G Codes:          SMITH,KAREN LUBECK 10/16/2013, 2:39 PM

## 2013-10-16 NOTE — Progress Notes (Signed)
ANTICOAGULATION CONSULT NOTE - Follow Up Consult  Pharmacy Consult for Heparin  Indication: DVT/PAD, s/p TNKase (stopped 6/22)  Allergies  Allergen Reactions  . Sulfa Antibiotics Other (See Comments)    Cold sweat light headed and disorientation  . Tiotropium Bromide Shortness Of Breath and Other (See Comments)    Sore throat also  . Latex Itching and Rash    Patient Measurements: Height: 5\' 2"  (157.5 cm) Weight: 174 lb 6.1 oz (79.1 kg) IBW/kg (Calculated) : 50.1  Vital Signs: Temp: 99.1 F (37.3 C) (06/23 2147) Temp src: Oral (06/23 2147) BP: 143/39 mmHg (06/23 2147) Pulse Rate: 65 (06/23 2147)  Labs:  Recent Labs  10/14/13 0500 10/14/13 1100 10/14/13 1350 10/15/13 0449 10/15/13 2143  HGB 11.2* 10.5*  --  10.5*  --   HCT 34.6* 32.8*  --  31.4*  --   PLT 147* 131*  --  119*  --   HEPARINUNFRC 0.34 1.08* 0.39  --  <0.10*  CREATININE 0.85  --   --   --   --     Estimated Creatinine Clearance: 46.3 ml/min (by C-G formula based on Cr of 0.85).   Medications:  Heparin 850 units/hr  Assessment: 80 y/o F s/p thrombolysis from 6/21-6/22, heparin restarted 6/23, first HL after re-start is <0.1. Other labs as above. Infusing ok per RN.   Goal of Therapy:  Heparin level 0.3-0.7 units/ml Monitor platelets by anticoagulation protocol: Yes   Plan:  -Increase heparin to 1000 units/hr -1000 HL -Daily CBC/HL -Monitor for bleeding  Narda Bonds 10/16/2013,1:15 AM

## 2013-10-17 LAB — CBC
HCT: 30.7 % — ABNORMAL LOW (ref 36.0–46.0)
Hemoglobin: 10.3 g/dL — ABNORMAL LOW (ref 12.0–15.0)
MCH: 28.5 pg (ref 26.0–34.0)
MCHC: 33.6 g/dL (ref 30.0–36.0)
MCV: 85 fL (ref 78.0–100.0)
PLATELETS: 137 10*3/uL — AB (ref 150–400)
RBC: 3.61 MIL/uL — ABNORMAL LOW (ref 3.87–5.11)
RDW: 13.1 % (ref 11.5–15.5)
WBC: 9.8 10*3/uL (ref 4.0–10.5)

## 2013-10-17 LAB — PROTIME-INR
INR: 1.26 (ref 0.00–1.49)
Prothrombin Time: 15.8 seconds — ABNORMAL HIGH (ref 11.6–15.2)

## 2013-10-17 LAB — HEPARIN LEVEL (UNFRACTIONATED): Heparin Unfractionated: 0.27 IU/mL — ABNORMAL LOW (ref 0.30–0.70)

## 2013-10-17 MED ORDER — WARFARIN SODIUM 5 MG PO TABS: 5.0000 mg | ORAL_TABLET | Freq: Once | ORAL | Status: DC

## 2013-10-17 NOTE — Discharge Summary (Signed)
[facility] Discharge Summary  [name] [name] [birth year] 80 y.o. female  [id number]  Admission Date: 10/12/2013  Discharge Date: 10/17/2013  Physician: No att. providers found  Admission Diagnosis: Right leg pain [729.5]   HPI:   This is a 80 y.o. female who was seen by Dr. Donnetta Hutching in the [facility] office reporting change in sensation in her right foot. She has a very extensive past vascular surgery history. She underwent prior staged bilateral femoral-popliteal bypasses with [name] in the 1990s in mid 2000. She has had several occlusions of her right femoral-popliteal bypass. She underwent lysis of her right pop bypass in 2010 with subsequent failure after approximately 6 months and then revision by Dr. Oneida Alar. She has been followed in our office with patent bilateral grafts. Most recently she was seen in April of 2015. At that time she did have slight reduction in her ankle arm index bilaterally but patent grafts. She reported she had sensation of tightness in her right leg which was new for her. She was told to report to the emergency room for further evaluation. Dr. Donnetta Hutching discussed options of lysis versus bypass revision versus observation depending on arteriogram findings.   Hospital Course:  The patient was admitted to the hospital on 10/13/13 and underwent arteriogram by interventional radiology of her right lower extremity. Her arteriogram revealed an occluded right femoral to below-knee popliteal bypass graft. Lysis was initiated through the left common femoral artery with a 45F sheath. Following her procedure, she was placed on a heparin drip and started on coumadin. Her left femoral sheath site had no active bleeding or hematoma. She had palpable dorsalis pedis pulses. She stayed in the hospital for an additional day after her lysis intervention due to deconditioning. After working with physical therapy, she was recommended to go home with a rolling walker. The  remainder of the hospital course consisted of increasing mobilization and increasing intake of solids without difficulty. She was discharged home with home health. She was prescribed coumadin and an appointment was made for her to follow-up on 10/21/13 to check her INR.   CBC    Component Value Date/Time   WBC 9.8 10/17/2013 0245   RBC 3.61* 10/17/2013 0245   HGB 10.3* 10/17/2013 0245   HCT 30.7* 10/17/2013 0245   PLT 137* 10/17/2013 0245   MCV 85.0 10/17/2013 0245   MCH 28.5 10/17/2013 0245   MCHC 33.6 10/17/2013 0245   RDW 13.1 10/17/2013 0245   LYMPHSABS 2.4 12/31/2012 1419   MONOABS 0.6 12/31/2012 1419   EOSABS 0.1 12/31/2012 1419   BASOSABS 0.0 12/31/2012 1419    BMET    Component Value Date/Time   NA 138 10/14/2013 0500   K 4.6 10/14/2013 0500   CL 101 10/14/2013 0500   CO2 26 10/14/2013 0500   GLUCOSE 150* 10/14/2013 0500   BUN 14 10/14/2013 0500   CREATININE 0.85 10/14/2013 0500   CALCIUM 8.7 10/14/2013 0500   GFRNONAA 60* 10/14/2013 0500   GFRAA 70* 10/14/2013 0500     Discharge Instructions:   The patient is discharged to home with home health with extensive instructions on wound care and progressive ambulation.  They are instructed not to drive or perform any heavy lifting until returning to see the physician in his office.  Discharge Instructions   Call MD for:  redness, tenderness, or signs of infection (pain, swelling, bleeding, redness, odor or green/yellow discharge around incision site)    Complete by:  As directed  Call MD for:  severe or increased pain, loss or decreased feeling  in affected limb(s)    Complete by:  As directed      Call MD for:  temperature >100.5    Complete by:  As directed      Discharge instructions    Complete by:  As directed   Please take your coumadin at 6:00 pm every day. Please have your blood checked on Monday, 10/21/13 at 11:30 am at Dr. Adline Mango office.     Driving Restrictions    Complete by:  As directed   No driving restrictions.      Increase activity slowly    Complete by:  As directed   Walk with assistance use walker or cane as needed  Please use walker for ambulation.     Lifting restrictions    Complete by:  As directed   No lifting for 2 weeks     No wound care    Complete by:  As directed      Nursing communication    Complete by:  As directed   Please have patient pick up coumadin prescription at her pharmacy. She will be taking her coumadin daily at 1800. Please remind her to have her INR checked on Monday at 11:30 at Dr. Arnoldo Morale office.     Resume previous diet    Complete by:  As directed            Discharge Diagnosis:  Right leg pain [729.5]  Secondary Diagnosis: Patient Active Problem List   Diagnosis Date Noted  . PVD (peripheral vascular disease) 07/25/2013  . Syncope 12/29/2011  . Lumbar pain with radiation down right leg 09/13/2010  . Allergic rhinitis due to pollen 06/25/2010  . CHRONIC OBSTRUCTIVE PULMONARY DISEASE 01/22/2010  . PULMONARY NODULE, RIGHT LOWER LOBE 01/12/2010  . TOBACCO ABUSE, HX OF 01/11/2010  . IMPAIRED GLUCOSE TOLERANCE 01/06/2009  . OSTEOPOROSIS 01/06/2009  . HIP PAIN, LEFT 11/05/2008  . CONJUNCTIVITIS, ALLERGIC 08/04/2008  . POSTHERPETIC NEURALGIA 11/26/2007  . ECZEMA 11/26/2007  . PERIPHERAL VASCULAR DISEASE 06/29/2007  . COLITIS 12/20/2006  . ANXIETY STATE NEC 12/04/2006  . DIVERTICULOSIS, COLON 12/04/2006  . HYPERLIPIDEMIA 10/23/2006  . HYPERTENSION 10/23/2006  . COLONIC POLYPS, RECURRENT 08/29/2006  . HIATAL HERNIA, HX OF 03/25/1999  . ESOPHAGEAL STRICTURE 08/09/1993   Past Medical History  Diagnosis Date  . GERD (gastroesophageal reflux disease)   . Hyperlipidemia   . History of colonic polyps   . Diverticulosis   . Shingles   . Eczema   . Allergic rhinoconjunctivitis   . Lung nodule 2011  . Environmental allergies     allergy shot- q friday in Dr. Janee Morn office. PFT's abnormal- recommended Spiriva to use preop & will d/c after surgery  .  Arthritis     low back , stenosis  . Anxiety     pt. managed- uses deep breathing   . Stenosis of popliteal artery     blood clots in legs long ago    . COPD (chronic obstructive pulmonary disease)   . H/O hiatal hernia   . Hypertension   . Trigeminal neuralgia   . Peripheral vascular disease   . DVT (deep venous thrombosis)        Medication List         ALEVE PO  Take 220 mg by mouth daily as needed (for arthiritis). Back pain     aspirin 81 MG tablet  Take 81 mg by mouth daily.  calcium-vitamin D 500-200 MG-UNIT per tablet  Commonly known as:  OSCAL WITH D  Take 1 tablet by mouth daily.     carbamazepine 200 MG tablet  Commonly known as:  TEGRETOL  Take 200 mg by mouth at bedtime.     PRILOSEC 20 MG capsule  Generic drug:  omeprazole  Take 20 mg by mouth every morning.     simvastatin 40 MG tablet  Commonly known as:  ZOCOR  Take 40 mg by mouth at bedtime.     tetrahydrozoline 0.05 % ophthalmic solution  Place 1 drop into both eyes daily as needed (for allergies).     valsartan-hydrochlorothiazide 160-12.5 MG per tablet  Commonly known as:  DIOVAN-HCT  Take 1 tablet by mouth daily.     Vitamin D3 1000 UNITS Caps  Take 1,000 Units by mouth daily.     warfarin 5 MG tablet  Commonly known as:  COUMADIN  Take 1 tablet (5 mg total) by mouth one time only at 6 PM.        Prescriptions: Coumadin 5 mg q 1800, 0 RF; No medications filled.   Disposition: Home  Patient's condition: is Good  Follow up: 1. Dr. Arnoldo Morale on 10/21/13 for INR check.    Virgina Jock, PA-C [facility] [phone] 10/17/2013  3:55 PM

## 2013-10-17 NOTE — Progress Notes (Signed)
IV and tele monitor d/c at this time; pt to d/c home with daughter; pt awaiting walker to be delivered to room prior to d/c home; pt given d/c instructions and follow up appointments; will cont. To monitor.

## 2013-10-17 NOTE — Progress Notes (Addendum)
  [facility] Progress Note  10/17/2013 7:24 AM   Subjective:  Patient is doing well this morning. No complaints. Is ready to go home.    Filed Vitals:   10/17/13 0451  BP: 124/37  Pulse: 68  Temp: 99.2 F (37.3 C)  Resp: 18    Physical Exam: Incisions:  Left groin sheath site with no bleeding or hematoma Extremities:  2+ left DP pulse, dopplerable right DP signal   CBC    Component Value Date/Time   WBC 9.8 10/17/2013 0245   RBC 3.61* 10/17/2013 0245   HGB 10.3* 10/17/2013 0245   HCT 30.7* 10/17/2013 0245   PLT 137* 10/17/2013 0245   MCV 85.0 10/17/2013 0245   MCH 28.5 10/17/2013 0245   MCHC 33.6 10/17/2013 0245   RDW 13.1 10/17/2013 0245   LYMPHSABS 2.4 12/31/2012 1419   MONOABS 0.6 12/31/2012 1419   EOSABS 0.1 12/31/2012 1419   BASOSABS 0.0 12/31/2012 1419    BMET    Component Value Date/Time   NA 138 10/14/2013 0500   K 4.6 10/14/2013 0500   CL 101 10/14/2013 0500   CO2 26 10/14/2013 0500   GLUCOSE 150* 10/14/2013 0500   BUN 14 10/14/2013 0500   CREATININE 0.85 10/14/2013 0500   CALCIUM 8.7 10/14/2013 0500   GFRNONAA 60* 10/14/2013 0500   GFRAA 70* 10/14/2013 0500    INR    Component Value Date/Time   INR 1.26 10/17/2013 0245     Intake/Output Summary (Last 24 hours) at 10/17/13 0724 Last data filed at 10/17/13 0452  Gross per 24 hour  Intake    480 ml  Output   2100 ml  Net  -1620 ml     Assessment:  80 y.o. female is with right femoral popliteal bypass occlusion s/p IR lysis  Plan: -Ordered rolling walker for home.  -Pt will follow up Monday for INR check.  -D/C home today on coumadin.   Virgina Jock, PA-C [facility] Office: [phone] Pager: [phone] 10/17/2013 7:24 AM     Agree with above d/c home Needs graft duplex in 3 months  [name], MD [facility] [place] Office: [phone] Pager: [phone]

## 2013-10-17 NOTE — Progress Notes (Signed)
ANTICOAGULATION CONSULT NOTE - Follow Up Consult  Pharmacy Consult for Heparin  Indication: DVT/PAD, s/p TNKase (stopped 6/22)  Allergies  Allergen Reactions  . Sulfa Antibiotics Other (See Comments)    Cold sweat light headed and disorientation  . Tiotropium Bromide Shortness Of Breath and Other (See Comments)    Sore throat also  . Latex Itching and Rash    Patient Measurements: Height: 5\' 2"  (157.5 cm) Weight: 174 lb 6.1 oz (79.1 kg) IBW/kg (Calculated) : 50.1  Vital Signs: Temp: 98.7 F (37.1 C) (06/24 1923) Temp src: Oral (06/24 1923) BP: 136/55 mmHg (06/24 1923) Pulse Rate: 66 (06/24 1923)  Labs:  Recent Labs  10/14/13 0500  10/15/13 0449  10/16/13 0305 10/16/13 1012 10/16/13 2204 10/17/13 0245  HGB 11.2*  < > 10.5*  --  10.9*  --   --  10.3*  HCT 34.6*  < > 31.4*  --  32.5*  --   --  30.7*  PLT 147*  < > 119*  --  107*  --   --  137*  LABPROT  --   --   --   --  13.8  --   --  15.8*  INR  --   --   --   --  1.08  --   --  1.26  HEPARINUNFRC 0.34  < >  --   < >  --  <0.10* 0.25* 0.27*  CREATININE 0.85  --   --   --   --   --   --   --   < > = values in this interval not displayed.  Estimated Creatinine Clearance: 46.3 ml/min (by C-G formula based on Cr of 0.85).   Medications:  Heparin 1350 units/hr  Assessment: 80 y/o F s/p thrombolysis from 6/21-6/22, heparin restarted 6/23, HL is 0.27 after rate increase. Other labs as above.   Goal of Therapy:  Heparin level 0.3-0.7 units/ml Monitor platelets by anticoagulation protocol: Yes   Plan:  -Increase heparin to 1450 units/hr -1200 HL -Daily CBC/HL -Monitor for bleeding  Narda Bonds 10/17/2013,3:47 AM

## 2013-10-18 ENCOUNTER — Ambulatory Visit: Payer: Self-pay

## 2013-10-18 DIAGNOSIS — Z5181 Encounter for therapeutic drug level monitoring: Secondary | ICD-10-CM | POA: Diagnosis not present

## 2013-10-18 DIAGNOSIS — Z7901 Long term (current) use of anticoagulants: Secondary | ICD-10-CM | POA: Diagnosis not present

## 2013-10-18 DIAGNOSIS — M6281 Muscle weakness (generalized): Secondary | ICD-10-CM | POA: Diagnosis not present

## 2013-10-18 DIAGNOSIS — M48061 Spinal stenosis, lumbar region without neurogenic claudication: Secondary | ICD-10-CM | POA: Diagnosis not present

## 2013-10-18 DIAGNOSIS — I70209 Unspecified atherosclerosis of native arteries of extremities, unspecified extremity: Secondary | ICD-10-CM | POA: Diagnosis not present

## 2013-10-18 DIAGNOSIS — Z48812 Encounter for surgical aftercare following surgery on the circulatory system: Secondary | ICD-10-CM | POA: Diagnosis not present

## 2013-10-21 ENCOUNTER — Ambulatory Visit: Payer: Self-pay

## 2013-10-21 ENCOUNTER — Ambulatory Visit (INDEPENDENT_AMBULATORY_CARE_PROVIDER_SITE_OTHER): Payer: Medicare Other | Admitting: General Practice

## 2013-10-21 ENCOUNTER — Ambulatory Visit: Payer: Self-pay | Admitting: Internal Medicine

## 2013-10-21 DIAGNOSIS — Z7901 Long term (current) use of anticoagulants: Secondary | ICD-10-CM | POA: Diagnosis not present

## 2013-10-21 DIAGNOSIS — M48061 Spinal stenosis, lumbar region without neurogenic claudication: Secondary | ICD-10-CM | POA: Diagnosis not present

## 2013-10-21 DIAGNOSIS — Z5181 Encounter for therapeutic drug level monitoring: Secondary | ICD-10-CM | POA: Insufficient documentation

## 2013-10-21 DIAGNOSIS — I70209 Unspecified atherosclerosis of native arteries of extremities, unspecified extremity: Secondary | ICD-10-CM | POA: Diagnosis not present

## 2013-10-21 DIAGNOSIS — M6281 Muscle weakness (generalized): Secondary | ICD-10-CM | POA: Diagnosis not present

## 2013-10-21 DIAGNOSIS — I749 Embolism and thrombosis of unspecified artery: Secondary | ICD-10-CM | POA: Insufficient documentation

## 2013-10-21 DIAGNOSIS — Z48812 Encounter for surgical aftercare following surgery on the circulatory system: Secondary | ICD-10-CM | POA: Diagnosis not present

## 2013-10-21 LAB — POCT INR: INR: 1.26

## 2013-10-21 NOTE — Progress Notes (Signed)
Pre visit review using our clinic review tool, if applicable. No additional management support is needed unless otherwise documented below in the visit note. 

## 2013-10-22 ENCOUNTER — Telehealth: Payer: Self-pay | Admitting: Vascular Surgery

## 2013-10-22 DIAGNOSIS — M48061 Spinal stenosis, lumbar region without neurogenic claudication: Secondary | ICD-10-CM | POA: Diagnosis not present

## 2013-10-22 DIAGNOSIS — Z5181 Encounter for therapeutic drug level monitoring: Secondary | ICD-10-CM | POA: Diagnosis not present

## 2013-10-22 DIAGNOSIS — I70209 Unspecified atherosclerosis of native arteries of extremities, unspecified extremity: Secondary | ICD-10-CM | POA: Diagnosis not present

## 2013-10-22 DIAGNOSIS — Z7901 Long term (current) use of anticoagulants: Secondary | ICD-10-CM | POA: Diagnosis not present

## 2013-10-22 DIAGNOSIS — Z48812 Encounter for surgical aftercare following surgery on the circulatory system: Secondary | ICD-10-CM | POA: Diagnosis not present

## 2013-10-22 DIAGNOSIS — M6281 Muscle weakness (generalized): Secondary | ICD-10-CM | POA: Diagnosis not present

## 2013-10-22 NOTE — Telephone Encounter (Addendum)
Message copied by Gena Fray on Tue Oct 22, 2013  3:02 PM ------      Message from: Elam Dutch      Created: Tue Oct 22, 2013 10:16 AM      Regarding: RE: Follow Up       3 weeks with ABI and graft duplex at that visit            Juanda Crumble      ----- Message -----         From: Gena Fray         Sent: 10/21/2013  12:23 PM           To: Elam Dutch, MD      Subject: Follow Up                                                Dr Oneida Alar,            Ms Elster called to ask when you would like to see her again. She thought you said 3 weeks, but your progress note from the [facility] stated 3 months with graft duplex. Please advise.            Thanks,      Hinton Dyer       ------  10/22/13: lm for [facility] with new appt of 07/21, dpm

## 2013-10-23 ENCOUNTER — Other Ambulatory Visit: Payer: Self-pay | Admitting: Internal Medicine

## 2013-10-24 ENCOUNTER — Other Ambulatory Visit (HOSPITAL_COMMUNITY): Payer: Self-pay

## 2013-10-24 ENCOUNTER — Encounter (HOSPITAL_COMMUNITY): Payer: Self-pay

## 2013-10-24 ENCOUNTER — Ambulatory Visit (INDEPENDENT_AMBULATORY_CARE_PROVIDER_SITE_OTHER): Payer: Medicare Other | Admitting: General Practice

## 2013-10-24 ENCOUNTER — Ambulatory Visit: Payer: Self-pay | Admitting: Family

## 2013-10-24 DIAGNOSIS — Z5181 Encounter for therapeutic drug level monitoring: Secondary | ICD-10-CM | POA: Diagnosis not present

## 2013-10-24 DIAGNOSIS — I749 Embolism and thrombosis of unspecified artery: Secondary | ICD-10-CM

## 2013-10-24 DIAGNOSIS — Z7901 Long term (current) use of anticoagulants: Secondary | ICD-10-CM | POA: Diagnosis not present

## 2013-10-24 DIAGNOSIS — I70209 Unspecified atherosclerosis of native arteries of extremities, unspecified extremity: Secondary | ICD-10-CM | POA: Diagnosis not present

## 2013-10-24 DIAGNOSIS — Z48812 Encounter for surgical aftercare following surgery on the circulatory system: Secondary | ICD-10-CM | POA: Diagnosis not present

## 2013-10-24 DIAGNOSIS — M6281 Muscle weakness (generalized): Secondary | ICD-10-CM | POA: Diagnosis not present

## 2013-10-24 DIAGNOSIS — M48061 Spinal stenosis, lumbar region without neurogenic claudication: Secondary | ICD-10-CM | POA: Diagnosis not present

## 2013-10-24 LAB — POCT INR: INR: 1.1

## 2013-10-24 NOTE — Progress Notes (Signed)
Pre visit review using our clinic review tool, if applicable. No additional management support is needed unless otherwise documented below in the visit note. 

## 2013-10-28 ENCOUNTER — Ambulatory Visit (INDEPENDENT_AMBULATORY_CARE_PROVIDER_SITE_OTHER): Payer: Medicare Other | Admitting: General Practice

## 2013-10-28 DIAGNOSIS — Z5181 Encounter for therapeutic drug level monitoring: Secondary | ICD-10-CM

## 2013-10-28 DIAGNOSIS — M48061 Spinal stenosis, lumbar region without neurogenic claudication: Secondary | ICD-10-CM | POA: Diagnosis not present

## 2013-10-28 DIAGNOSIS — I70209 Unspecified atherosclerosis of native arteries of extremities, unspecified extremity: Secondary | ICD-10-CM | POA: Diagnosis not present

## 2013-10-28 DIAGNOSIS — Z7901 Long term (current) use of anticoagulants: Secondary | ICD-10-CM | POA: Diagnosis not present

## 2013-10-28 DIAGNOSIS — M6281 Muscle weakness (generalized): Secondary | ICD-10-CM | POA: Diagnosis not present

## 2013-10-28 DIAGNOSIS — I749 Embolism and thrombosis of unspecified artery: Secondary | ICD-10-CM

## 2013-10-28 DIAGNOSIS — Z48812 Encounter for surgical aftercare following surgery on the circulatory system: Secondary | ICD-10-CM | POA: Diagnosis not present

## 2013-10-28 LAB — POCT INR: INR: 1.4

## 2013-10-28 NOTE — Progress Notes (Signed)
Pre visit review using our clinic review tool, if applicable. No additional management support is needed unless otherwise documented below in the visit note. 

## 2013-10-31 ENCOUNTER — Other Ambulatory Visit: Payer: Self-pay | Admitting: General Practice

## 2013-10-31 ENCOUNTER — Ambulatory Visit (INDEPENDENT_AMBULATORY_CARE_PROVIDER_SITE_OTHER): Payer: Medicare Other | Admitting: General Practice

## 2013-10-31 DIAGNOSIS — I749 Embolism and thrombosis of unspecified artery: Secondary | ICD-10-CM

## 2013-10-31 DIAGNOSIS — Z5181 Encounter for therapeutic drug level monitoring: Secondary | ICD-10-CM | POA: Diagnosis not present

## 2013-10-31 LAB — POCT INR: INR: 1.9

## 2013-10-31 MED ORDER — WARFARIN SODIUM 5 MG PO TABS: ORAL_TABLET | ORAL | Status: DC

## 2013-10-31 NOTE — Progress Notes (Signed)
Pre visit review using our clinic review tool, if applicable. No additional management support is needed unless otherwise documented below in the visit note. 

## 2013-11-01 ENCOUNTER — Ambulatory Visit (INDEPENDENT_AMBULATORY_CARE_PROVIDER_SITE_OTHER): Payer: Medicare Other

## 2013-11-01 DIAGNOSIS — J309 Allergic rhinitis, unspecified: Secondary | ICD-10-CM

## 2013-11-04 ENCOUNTER — Ambulatory Visit (HOSPITAL_COMMUNITY)
Admission: RE | Admit: 2013-11-04 | Discharge: 2013-11-04 | Disposition: A | Discharge status: Home / Self Care | Payer: Medicare Other | Source: Ambulatory Visit | Attending: Vascular Surgery | Admitting: Vascular Surgery

## 2013-11-04 ENCOUNTER — Other Ambulatory Visit: Payer: Self-pay | Admitting: Family

## 2013-11-04 ENCOUNTER — Ambulatory Visit (INDEPENDENT_AMBULATORY_CARE_PROVIDER_SITE_OTHER)
Admission: RE | Admit: 2013-11-04 | Discharge: 2013-11-04 | Disposition: A | Discharge status: Home / Self Care | Payer: Medicare Other | Source: Ambulatory Visit | Attending: Family | Admitting: Family

## 2013-11-04 DIAGNOSIS — Z48812 Encounter for surgical aftercare following surgery on the circulatory system: Secondary | ICD-10-CM

## 2013-11-04 DIAGNOSIS — I739 Peripheral vascular disease, unspecified: Secondary | ICD-10-CM | POA: Insufficient documentation

## 2013-11-07 ENCOUNTER — Ambulatory Visit (INDEPENDENT_AMBULATORY_CARE_PROVIDER_SITE_OTHER): Payer: Medicare Other | Admitting: General Practice

## 2013-11-07 DIAGNOSIS — Z5181 Encounter for therapeutic drug level monitoring: Secondary | ICD-10-CM | POA: Diagnosis not present

## 2013-11-07 DIAGNOSIS — I749 Embolism and thrombosis of unspecified artery: Secondary | ICD-10-CM | POA: Diagnosis not present

## 2013-11-07 LAB — POCT INR: INR: 2

## 2013-11-07 NOTE — Progress Notes (Signed)
Pre visit review using our clinic review tool, if applicable. No additional management support is needed unless otherwise documented below in the visit note. 

## 2013-11-08 ENCOUNTER — Ambulatory Visit (INDEPENDENT_AMBULATORY_CARE_PROVIDER_SITE_OTHER): Payer: Medicare Other

## 2013-11-08 DIAGNOSIS — J309 Allergic rhinitis, unspecified: Secondary | ICD-10-CM | POA: Diagnosis not present

## 2013-11-12 ENCOUNTER — Other Ambulatory Visit (HOSPITAL_COMMUNITY): Payer: Self-pay

## 2013-11-12 ENCOUNTER — Ambulatory Visit: Payer: Self-pay | Admitting: Vascular Surgery

## 2013-11-12 ENCOUNTER — Encounter (HOSPITAL_COMMUNITY): Payer: Self-pay

## 2013-11-13 ENCOUNTER — Encounter: Payer: Self-pay | Admitting: Vascular Surgery

## 2013-11-14 ENCOUNTER — Ambulatory Visit (INDEPENDENT_AMBULATORY_CARE_PROVIDER_SITE_OTHER): Payer: Medicare Other | Admitting: Vascular Surgery

## 2013-11-14 ENCOUNTER — Encounter: Payer: Self-pay | Admitting: Vascular Surgery

## 2013-11-14 ENCOUNTER — Ambulatory Visit: Payer: Self-pay | Admitting: Internal Medicine

## 2013-11-14 VITALS — BP 177/65 | HR 63 | Ht 62.0 in | Wt 173.0 lb

## 2013-11-14 DIAGNOSIS — I739 Peripheral vascular disease, unspecified: Secondary | ICD-10-CM

## 2013-11-14 NOTE — Addendum Note (Signed)
Addended by: Mena Goes on: 11/14/2013 01:46 PM   Modules accepted: Orders

## 2013-11-14 NOTE — Progress Notes (Signed)
Patient is an 80 year old female returns for followup today. She recently underwent thrombolysis for a right femoral to below-knee popliteal bypass graft in June. There was no significant stenosis noted after the thrombolysis. She was placed on Coumadin at discharge. She previously underwent right femoral to above-knee popliteal bypass by [name] in 2006. I revised this and extended to the below-knee popliteal artery and 2010. She has also previously had left femoral to above-knee popliteal bypass by [name] 2006. She currently is experiencing no claudication symptoms. She has no rest pain. She is satisfied with her walking distance.  Physical exam:  Filed Vitals:   11/14/13 1121  BP: 177/65  Pulse: 63  Height: 5\' 2"  (1.575 m)  Weight: 173 lb (78.472 kg)  SpO2: 98%    Bilateral lower extremities nonpalpable pedal pulses 2+ femoral pulses bilaterally  Data: The patient had bilateral ABIs performed on July 13. ABI on the left was 0.97 right was 0.86 triphasic waveforms bilaterally. She also underwent graft duplex scan which showed increased velocities at the proximal anastomosis in the right leg. Velocities were as high as 310 cm/s. Stenosis was estimated to be 50-75%  I reviewed her recent arteriogram. There are no oblique views of the proximal anastomosis. The proximal anastomosis although with some waisting I would say is certainly 53% or less narrowed.  Assessment: Status post thrombolysis for occlusion of right femoropopliteal bypass graft. Possible mild proximal anastomotic narrowing right leg Currently on Coumadin.  Plan:  Close observation for now. If velocities continued to increase we'll consider revision of proximal anastomosis right leg.  Continue Coumadin.  [name], MD [facility] [place] Office: [phone] Pager: [phone]

## 2013-11-15 ENCOUNTER — Ambulatory Visit (INDEPENDENT_AMBULATORY_CARE_PROVIDER_SITE_OTHER): Payer: Medicare Other

## 2013-11-15 DIAGNOSIS — J309 Allergic rhinitis, unspecified: Secondary | ICD-10-CM | POA: Diagnosis not present

## 2013-11-21 ENCOUNTER — Ambulatory Visit (INDEPENDENT_AMBULATORY_CARE_PROVIDER_SITE_OTHER): Payer: Medicare Other | Admitting: General Practice

## 2013-11-21 DIAGNOSIS — Z5181 Encounter for therapeutic drug level monitoring: Secondary | ICD-10-CM | POA: Diagnosis not present

## 2013-11-21 DIAGNOSIS — I749 Embolism and thrombosis of unspecified artery: Secondary | ICD-10-CM | POA: Diagnosis not present

## 2013-11-21 LAB — POCT INR: INR: 2.1

## 2013-11-21 NOTE — Progress Notes (Signed)
Pre visit review using our clinic review tool, if applicable. No additional management support is needed unless otherwise documented below in the visit note. 

## 2013-11-22 ENCOUNTER — Ambulatory Visit (INDEPENDENT_AMBULATORY_CARE_PROVIDER_SITE_OTHER): Payer: Medicare Other

## 2013-11-22 DIAGNOSIS — J309 Allergic rhinitis, unspecified: Secondary | ICD-10-CM

## 2013-11-29 ENCOUNTER — Ambulatory Visit (INDEPENDENT_AMBULATORY_CARE_PROVIDER_SITE_OTHER): Payer: Medicare Other

## 2013-11-29 DIAGNOSIS — J309 Allergic rhinitis, unspecified: Secondary | ICD-10-CM

## 2013-12-02 ENCOUNTER — Ambulatory Visit (INDEPENDENT_AMBULATORY_CARE_PROVIDER_SITE_OTHER): Payer: Medicare Other

## 2013-12-02 DIAGNOSIS — J309 Allergic rhinitis, unspecified: Secondary | ICD-10-CM | POA: Diagnosis not present

## 2013-12-06 ENCOUNTER — Ambulatory Visit (INDEPENDENT_AMBULATORY_CARE_PROVIDER_SITE_OTHER): Payer: Medicare Other

## 2013-12-06 DIAGNOSIS — J309 Allergic rhinitis, unspecified: Secondary | ICD-10-CM

## 2013-12-12 ENCOUNTER — Ambulatory Visit: Payer: Medicare Other

## 2013-12-12 ENCOUNTER — Ambulatory Visit (INDEPENDENT_AMBULATORY_CARE_PROVIDER_SITE_OTHER): Payer: Medicare Other | Admitting: Family

## 2013-12-12 DIAGNOSIS — I749 Embolism and thrombosis of unspecified artery: Secondary | ICD-10-CM | POA: Diagnosis not present

## 2013-12-12 DIAGNOSIS — Z5181 Encounter for therapeutic drug level monitoring: Secondary | ICD-10-CM

## 2013-12-12 LAB — POCT INR: INR: 2.2

## 2013-12-12 NOTE — Patient Instructions (Signed)
Take 2 tablets (10 mg) all days except 1 1/2 tablets (7.5 mg) on Tuesday, Thursday and Saturday.  Re-check in 4 weeks.  Anticoagulation Dose Instructions as of 12/12/2013     [name] Tue Wed Thu Fri Sat   New Dose 10 mg 10 mg 7.5 mg 10 mg 7.5 mg 10 mg 7.5 mg    Description       Take 2 tablets (10 mg) all days except 1 1/2 tablets (7.5 mg) on Tuesday, Thursday and Saturday.  Re-check in 4 weeks.

## 2013-12-13 ENCOUNTER — Ambulatory Visit (INDEPENDENT_AMBULATORY_CARE_PROVIDER_SITE_OTHER): Payer: Medicare Other

## 2013-12-13 DIAGNOSIS — J309 Allergic rhinitis, unspecified: Secondary | ICD-10-CM | POA: Diagnosis not present

## 2013-12-16 ENCOUNTER — Ambulatory Visit: Payer: Medicare Other | Admitting: Internal Medicine

## 2013-12-16 ENCOUNTER — Telehealth: Payer: Self-pay | Admitting: Internal Medicine

## 2013-12-16 ENCOUNTER — Encounter: Payer: Self-pay | Admitting: Internal Medicine

## 2013-12-16 VITALS — BP 122/70 | HR 55 | Ht 60.0 in | Wt 176.0 lb

## 2013-12-16 DIAGNOSIS — J301 Allergic rhinitis due to pollen: Secondary | ICD-10-CM

## 2013-12-16 MED ORDER — NEDOCROMIL SODIUM 2 % OP SOLN: 1.0000 [drp] | Freq: Two times a day (BID) | OPHTHALMIC | Status: DC | PRN

## 2013-12-16 MED ORDER — CROMOLYN SODIUM 4 % OP SOLN: 1.0000 [drp] | Freq: Two times a day (BID) | OPHTHALMIC | Status: DC | PRN

## 2013-12-16 NOTE — Patient Instructions (Addendum)
Script for Alocril/ cromolyn eye drops sent   Try using every day  Sample Patanase nasal spray - 1-2 puffs each nostril twice daily as needed    Try using every day  We can continue allergy vaccine 1:10 GH

## 2013-12-16 NOTE — Telephone Encounter (Signed)
Do they have a generic cromolyn eye drop- same directions?

## 2013-12-16 NOTE — Telephone Encounter (Signed)
Yes they did have this generic medication and this was going to cost her $14.  This has been sent in for her.  Thank you.

## 2013-12-16 NOTE — Telephone Encounter (Signed)
CY sent in alocril eye drops into the pharmacy today for the pt at her [facility].  The pharmacy is calling since this RX is $192 and is not covered by the pts insurance.  Requesting that something else be sent in for her.  CY please advise. Thanks  Allergies  Allergen Reactions  . Sulfa Antibiotics Other (See Comments)    Cold sweat light headed and disorientation  . Tiotropium Bromide Shortness Of Breath and Other (See Comments)    Sore throat also  . Latex Itching and Rash    Current Outpatient Prescriptions on File Prior to Visit  Medication Sig Dispense Refill  . Acetaminophen (TYLENOL ARTHRITIS PAIN PO) Take by mouth. Take as directed (usually once to twice daily)      . aspirin 81 MG tablet Take 81 mg by mouth daily.      . calcium-vitamin D (OSCAL WITH D 500-200) 500-200 MG-UNIT per tablet Take 1 tablet by mouth daily.        . carbamazepine (TEGRETOL) 200 MG tablet Take 200 mg by mouth at bedtime.      . Cholecalciferol (VITAMIN D3) 1000 UNITS CAPS Take 1,000 Units by mouth daily.       . nedocromil (ALOCRIL) 2 % ophthalmic solution Place 1 drop into both eyes 2 (two) times daily as needed.  5 mL  prn  . omeprazole (PRILOSEC) 20 MG capsule Take 20 mg by mouth every morning.       . simvastatin (ZOCOR) 40 MG tablet TAKE 1 TABLET BY MOUTH DAILY AT BEDTIME  90 tablet  0  . tetrahydrozoline 0.05 % ophthalmic solution Place 1 drop into both eyes daily as needed (for allergies).      . valsartan-hydrochlorothiazide (DIOVAN-HCT) 160-12.5 MG per tablet Take 1 tablet by mouth daily.      Marland Kitchen warfarin (COUMADIN) 5 MG tablet Take as directed by anticoagulation clinic  120 tablet  0   No current facility-administered medications on file prior to visit.

## 2013-12-16 NOTE — Progress Notes (Signed)
Patient ID: [name] [name], female    DOB: September 27, 1924, 80 y.o.   MRN: [id number]  HPI 08/02/10-85 yoF former smoker, followed here for allergic rhinitis on allergy vaccine, and COPD. She is bothered on chronic basis by her watery, burning eyes. She just saw Dr Katy Fitch, with all his drops tried. She is worse in past 1-2 months with Spring pollen season. She is resigned to staying this way. Seeing Dr Allyson Sabal for significant eczema of fingertips.  She remains satisfied that the allergy vaccine is helpful and worth continuing.  01/31/11-  86 yoF former smoker, followed here for allergic rhinitis on allergy vaccine, and COPD, lung nodules. She is bothered on chronic basis by her watery, burning eyes. Acute visit-ill for past 2 days. Complains of pressure in her ears, weakness and shakiness, headache, nausea. Eyes always water when she goes outdoors. She denies fever, swollen glands, sore throat, discolored sputum, chest tightness or wheeze. She has been on tramadol for several weeks and asks if that might be what started causing her to feel badly just a few days ago. CT chest 12/07/10-  IMPRESSION:  1. Compared with previous exam the previously referenced pulmonary  nodules in the right middle lobe have not changed in size.  Recommend continued follow-up to two years from initial study  01/15/2010.  Original Report Authenticated By: [name], M.D.   08/15/11- 75 yoF former smoker, followed here for allergic rhinitis on allergy vaccine, and COPD, lung nodules. She is bothered on chronic basis by her watery, burning eyes. Off of allergy vaccine for 6 weeks because of back surgery. Wants to resume and we discussed risk and safety considerations and alternatives. Allegra helps. Previously identified lung nodules, back to 2011. She had chest x-ray in hospital 06/27/2011 with stable nodularity right middle lobe. That completes 2 years of surveillance.  01/12/12- 14 yoF former smoker, followed here for  allergic rhinitis on allergy vaccine, and COPD, lung nodules. 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 Since last here had lumbar spine surgery, doing well. Evaluated at emergency room for vasovagal syncope and had normal stress test by her report, this week. Continues allergy vaccine 1:10 GH. Discussed  supplemental antihistamines. PPD skin test was negative this year. CT-01/04/12-images reviewed with her. She IMPRESSION:  1. While the two right middle lobe nodules noted on prior  examinations are unchanged, there is a new cluster of  peribronchovascular micronodules in the adjacent lateral segment of  the right middle lobe. Overall, this spectrum of findings strongly  favors a benign process (likely infectious disease), possibly  mycobacterial infection. These new nodules are highly unlikely to  be neoplastic, and the old nodules now demonstrate 2 years of  stability and can be considered radiographically benign. No other  suspicious appearing pulmonary nodules or masses are otherwise  identified.  2. Atherosclerosis, including left main and three-vessel coronary  artery disease.  3. Additional incidental findings, as above.  Original Report Authenticated By: [name], M.D.   04/19/12- 14 yoF former smoker, followed here for allergic rhinitis on allergy vaccine, and COPD, lung nodules. FOLLOWS FOR: Pt c/o increased SOB with exertion. Denies cough, wheezing, chest tightness.  She chokes easily on her food, which she finds "aggravating". Otherwise does not usually cough reports no phlegm, fever or sweat. No heartburn or food hanging up. Maybe a little more shortness of breath on exertion over the last  month or 2, climbing stairs. She had a normal stress echocardiogram documented below. She recognized improved stamina when she was going regularly to [employer], but she drifted away from  that over the holidays. Notes Recorded by Josue Hector, MD on 01/10/2012: Normal stress echo CT 04/11/12- IMPRESSION:  1. Findings, as above, remain consistent with a chronic indolent  atypical infectious process, likely a mycobacterial infection. The  cluster of peribronchovascular micronodules in the lateral segment  of the right middle lobe that was new on the prior examination is  persistent, however, the largest nodule within this cluster has  resolved. No findings suspicious for malignancy are seen on  today's examination.  2. Atherosclerosis, including left main and three-vessel coronary  artery disease.  3. Dilatation of the pulmonic trunk (3.8 cm in diameter), which  may suggest pulmonary arterial hypertension.  4. Additional incidental findings, as above.  Original Report Authenticated By: [name], M.D.  PFT: 05/24/2011 moderate obstructive airways disease with response to bronchodilator, air trapping, diffusion moderately reduced. FEV1 1.12/75%, FEV1/FVC 0.60, FEF 25-75% 0.47/27%. TLC 117% RV 168%, DLCO 57%.  10/18/12- 44 yoF former smoker, followed here for allergic rhinitis on allergy vaccine, and COPD, lung nodules/ ?MAIC FOLLOWS FOR: SOB on days with bad humidity; still on vaccine and doing well. Continues allergy vaccine 1:10 GH This has not been a bad pollen season. No cough. CT chest 10/04/12 IMPRESSION:  1. Dominant right middle lobe nodule is unchanged from 01/15/2010  and is therefore considered benign.  2. Additional scattered micro nodularity has a benign or post  infectious appearance.  3. Borderline pulmonary arterial enlargement can be seen with  pulmonary arterial hypertension.  Original Report Authenticated By: [name], M.D.  12/16/13- 30 yoF former smoker, followed here for allergic rhinitis on allergy vaccine, and COPD, lung nodules/ ?MAIC FOLLOWS NID:POEUM on  Allergy vaccine 1:10 GH and doing well.    Review of Systems-See  HPI Constitutional:   No-   weight loss, night sweats, fevers, chills,+ fatigue, lassitude. HEENT:   No-  headaches, difficulty swallowing, tooth/dental problems, sore throat,       No-  sneezing, itching, ear ache, nasal congestion, post nasal drip,  CV:  No-   chest pain, orthopnea, PND, swelling in lower extremities, anasarca, dizziness, palpitations Resp: +shortness of breath with exertion or at rest.              No-   productive cough,  No non-productive cough,  No-  coughing up of blood.              No-   change in color of mucus.  No- wheezing.   Skin: No-   rash or lesions. GI:  No-   heartburn, indigestion, abdominal pain, + nausea, GU: . MS:  No-   joint pain or swelling.  Neuro- no new process Psych:  No- change in mood or affect. No depression or anxiety.  No memory loss.    Objective:   Physical Exam General- Alert, Oriented, Affect-appropriate, Distress- none acute Skin- rash-none, lesions- none, excoriation- none Lymphadenopathy- none Head- atraumatic            Eyes- Gross vision intact, PERRLA, conjunctivae clear secretions            Ears- Hearing, canals-normal            Nose- +sniffing, no-Septal dev, mucus, polyps, erosion, perforation             Throat- Mallampati II ,  mucosa clear , drainage- none, tonsils- atrophic Neck- flexible , trachea midline, no stridor , thyroid nl, carotid no bruit Chest - symmetrical excursion , unlabored           Heart/CV- RRR , no murmur , no gallop  , no rub, nl s1 s2                           - JVD- none , edema- none, stasis changes- none, varices- none           Lung- clear to P&A, wheeze- none, cough- none , dullness-none, rub- none           Chest wall-  Abd-  Br/ Gen/ Rectal- Not done, not indicated Extrem- cyanosis- none, clubbing, none, atrophy- none, strength- nl Neuro- grossly intact to observation; mildly tremulous

## 2013-12-20 ENCOUNTER — Ambulatory Visit (INDEPENDENT_AMBULATORY_CARE_PROVIDER_SITE_OTHER): Payer: Medicare Other

## 2013-12-20 DIAGNOSIS — J309 Allergic rhinitis, unspecified: Secondary | ICD-10-CM

## 2013-12-25 ENCOUNTER — Encounter: Payer: Self-pay | Admitting: Internal Medicine

## 2013-12-27 ENCOUNTER — Ambulatory Visit (INDEPENDENT_AMBULATORY_CARE_PROVIDER_SITE_OTHER): Payer: Medicare Other

## 2013-12-27 DIAGNOSIS — J309 Allergic rhinitis, unspecified: Secondary | ICD-10-CM | POA: Diagnosis not present

## 2014-01-02 ENCOUNTER — Telehealth: Payer: Self-pay | Admitting: *Deleted

## 2014-01-02 NOTE — Telephone Encounter (Signed)
Calling patient to r/s appointment time on 01/09/14, time was changed to 10:30 am, patient also informed me that she does have a prior appointment at 9 am, but believes that she will make it on time.

## 2014-01-03 ENCOUNTER — Ambulatory Visit: Payer: Medicare Other

## 2014-01-06 ENCOUNTER — Ambulatory Visit: Payer: Self-pay | Admitting: Internal Medicine

## 2014-01-07 ENCOUNTER — Other Ambulatory Visit: Payer: Self-pay | Admitting: Internal Medicine

## 2014-01-09 ENCOUNTER — Ambulatory Visit (INDEPENDENT_AMBULATORY_CARE_PROVIDER_SITE_OTHER): Payer: Medicare Other | Admitting: Adult Health

## 2014-01-09 ENCOUNTER — Ambulatory Visit: Payer: Medicare Other | Admitting: Nurse Practitioner

## 2014-01-09 ENCOUNTER — Ambulatory Visit (INDEPENDENT_AMBULATORY_CARE_PROVIDER_SITE_OTHER): Payer: Medicare Other | Admitting: Family

## 2014-01-09 ENCOUNTER — Encounter: Payer: Self-pay | Admitting: Adult Health

## 2014-01-09 VITALS — BP 142/85 | HR 57 | Ht 61.5 in | Wt 176.0 lb

## 2014-01-09 DIAGNOSIS — Z5181 Encounter for therapeutic drug level monitoring: Secondary | ICD-10-CM | POA: Diagnosis not present

## 2014-01-09 DIAGNOSIS — G500 Trigeminal neuralgia: Secondary | ICD-10-CM | POA: Diagnosis not present

## 2014-01-09 DIAGNOSIS — I749 Embolism and thrombosis of unspecified artery: Secondary | ICD-10-CM | POA: Diagnosis not present

## 2014-01-09 LAB — POCT INR: INR: 1.5

## 2014-01-09 MED ORDER — CARBAMAZEPINE 200 MG PO TABS: 100.0000 mg | ORAL_TABLET | Freq: Every day | ORAL | Status: DC

## 2014-01-09 MED ORDER — WARFARIN SODIUM 5 MG PO TABS: ORAL_TABLET | ORAL | Status: DC

## 2014-01-09 NOTE — Patient Instructions (Signed)
Trigeminal Neuralgia  Trigeminal neuralgia is a nerve disorder that causes sudden attacks of severe facial pain. It is caused by damage to the trigeminal nerve, a major nerve in the face. It is more common in women and in the elderly, although it can also happen in younger patients. Attacks last from a few seconds to several minutes and can occur from a couple of times per year to several times per day. Trigeminal neuralgia can be a very distressing and disabling condition. Surgery may be needed in very severe cases if medical treatment does not give relief.  HOME CARE INSTRUCTIONS    If your caregiver prescribed medication to help prevent attacks, take as directed.   To help prevent attacks:   Chew on the unaffected side of the mouth.   Avoid touching your face.   Avoid blasts of hot or cold air.   Men may wish to grow a beard to avoid having to shave.  SEEK IMMEDIATE MEDICAL CARE IF:   Pain is unbearable and your medicine does not help.   You develop new, unexplained symptoms (problems).   You have problems that may be related to a medication you are taking.  Document Released: 04/08/2000 Document Revised: 07/04/2011 Document Reviewed: 02/06/2009  [employer] Patient Information 2015 [employer]. This information is not intended to replace advice given to you by your health care provider. Make sure you discuss any questions you have with your health care provider.

## 2014-01-09 NOTE — Patient Instructions (Signed)
Take and extra 1/2 tab today (thurs) and tomorrow (friday). Then continue 2 tablets (10 mg) all days except 1 1/2 tablets (7.5 mg) on Tuesday, Thursday and Saturday.  Re-check in 3 weeks. Anticoagulation Dose Instructions as of 01/09/2014     [name] Tue Wed Thu Fri Sat   New Dose 10 mg 10 mg 7.5 mg 10 mg 7.5 mg 10 mg 7.5 mg    Description       Take and extra 1/2 tab today (thurs) and tomorrow (friday). Then continue 2 tablets (10 mg) all days except 1 1/2 tablets (7.5 mg) on Tuesday, Thursday and Saturday.  Re-check in 3 weeks.

## 2014-01-09 NOTE — Progress Notes (Signed)
I agree with the assessment and plan as directed by NP .The patient is known to me .   Mikhi Athey, MD  

## 2014-01-09 NOTE — Progress Notes (Signed)
PATIENT: [name] [name] DOB: [birth year]  REASON FOR VISIT: follow up HISTORY FROM: patient  HISTORY OF PRESENT ILLNESS: [name] [name] is an 80 year old female with a history of trigeminal neuralgia. She returns today for follow-up. She currently taking tegretol and is tolerating it well. She reports that her pain has been under control. She states that her only complaint is that it makes her sleepy. She confirms that she only takes the medication at night. She does have some back pain due to arthritis and takes tylenol for that. She is on coumadin for DVTs.  Otherwise the patient is doing well.   HISTORY 01/03/13 (CD): 80 y.o. female Is seen here as a referral/ revisit from Dr. Arnoldo Morale for the visit. I had seen the patient last in 2012 when she was followed here for her trigeminal neuralgia. She had reported at that time occupational reefed discomfort when chewing. She did have an emergency room visit for a fainting spell she fell from the commode. The patient also underwent multiple back surgeries in spring of last year and spent afterwards 3 weeks in [facility] for rehabilitation. The patient has in the past had operation for a bypass of the femoral popliteal artery.  She was temporarily on oxycodone for the treatment of back pain which she has neither enjoyed nor maintained.  The patient has been a member in the [employer] program until her 78 year old sister fell and fractured her spine and hip . 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  She still works 2 days a week and volunteers. She had no recent falls, no balance issues, no headaches. She has occasionally noted a tremor in her right hand which is still there.   REVIEW OF SYSTEMS: Full 14 system review of systems performed and notable only for:  Constitutional: N/A  Eyes: Eye itching Ear/Nose/Throat: Hearing loss, runny nose  Skin: N/A  Cardiovascular: N/A  Respiratory: N/A  Gastrointestinal: N/A  Genitourinary:  N/A Hematology/Lymphatic: N/A  Endocrine: N/A Musculoskeletal: Walking difficulty  Allergy/Immunology: Environmental allergies Neurological: N/A Psychiatric: N/A Sleep: N/A   ALLERGIES: Allergies  Allergen Reactions  . Sulfa Antibiotics Other (See Comments)    Cold sweat light headed and disorientation  . Tiotropium Bromide Shortness Of Breath and Other (See Comments)    Sore throat also  . Latex Itching and Rash    HOME MEDICATIONS: Outpatient Prescriptions Prior to Visit  Medication Sig Dispense Refill  . Acetaminophen (TYLENOL ARTHRITIS PAIN PO) Take by mouth. Take as directed (usually once to twice daily)      . aspirin 81 MG tablet Take 81 mg by mouth daily.      . calcium-vitamin D (OSCAL WITH D 500-200) 500-200 MG-UNIT per tablet Take 1 tablet by mouth daily.        . carbamazepine (TEGRETOL) 200 MG tablet Take 200 mg by mouth at bedtime.      . Cholecalciferol (VITAMIN D3) 1000 UNITS CAPS Take 1,000 Units by mouth daily.       Marland Kitchen omeprazole (PRILOSEC) 20 MG capsule Take 20 mg by mouth every morning.       . simvastatin (ZOCOR) 40 MG tablet TAKE 1 TABLET BY MOUTH DAILY AT BEDTIME  90 tablet  0  . valsartan-hydrochlorothiazide (DIOVAN-HCT) 160-12.5 MG per tablet Take 1 tablet by mouth daily.      Marland Kitchen warfarin (COUMADIN) 5 MG tablet TAKE AS DIRECTED BY ANTICOAGULATION CLINIC  120 tablet  0  . cromolyn (OPTICROM) 4 %  ophthalmic solution Place 1 drop into both eyes 2 (two) times daily as needed.  10 mL  0  . tetrahydrozoline 0.05 % ophthalmic solution Place 1 drop into both eyes daily as needed (for allergies).       No facility-administered medications prior to visit.    PAST MEDICAL HISTORY: Past Medical History  Diagnosis Date  . GERD (gastroesophageal reflux disease)   . Hyperlipidemia   . History of colonic polyps   . Diverticulosis   . Shingles   . Eczema   . Allergic rhinoconjunctivitis   . Lung nodule 2011  . Environmental allergies     allergy shot- q friday  in Dr. Janee Morn office. PFT's abnormal- recommended Spiriva to use preop & will d/c after surgery  . Arthritis     low back , stenosis  . Anxiety     pt. managed- uses deep breathing   . Stenosis of popliteal artery     blood clots in legs long ago    . COPD (chronic obstructive pulmonary disease)   . H/O hiatal hernia   . Hypertension   . Trigeminal neuralgia   . Peripheral vascular disease   . DVT (deep venous thrombosis)     PAST SURGICAL HISTORY: Past Surgical History  Procedure Laterality Date  . Tubal ligation    . Cholecystectomy  1998  . Femoral-popliteal bypass graft         x2 surgeries 1990's & 2009  . Eye surgery      cataracts removed- bilateral /w IOL  . Tonsillectomy      as a teenager   . Tonsilectomy, adenoidectomy, bilateral myringotomy and tubes      no myringotomy tubes   . Lumbar fusion  07/06/2011  . Spine surgery  March 2013    FAMILY HISTORY: Family History  Problem Relation Age of Onset  . Arthritis Mother   . Hypertension Mother   . Heart disease Father   . Colon polyps Father   . Breast cancer Sister   . Lung cancer Sister   . Colon cancer Sister   . Cancer Sister     Breast  . Lung cancer Brother   . Cancer Brother     Lung  . Anesthesia problems Neg Hx   . Hypotension Neg Hx   . Malignant hyperthermia Neg Hx   . Pseudochol deficiency Neg Hx   . Cancer Daughter     Breast    SOCIAL HISTORY: History   Social History  . Marital Status: Single    Spouse Name: N/A    Number of Children: 4  . Years of Education: 12   Occupational History  .     Social History Main Topics  . Smoking status: Former Smoker -- 2.00 packs/day for 30 years    Types: Cigarettes    Quit date: 06/18/1993  . Smokeless tobacco: Never Used     Comment: QUIT IN 1995  . Alcohol Use: No  . Drug Use: No  . Sexual Activity: Not on file   Other Topics Concern  . Not on file   Social History Narrative   Patient is single with 4 children.   Patient is  right handed.   Patient has high school education.   Patient drinks 1 cup daily.      PHYSICAL EXAM  Filed Vitals:   01/09/14 1006  BP: 142/85  Pulse: 57  Height: 5' 1.5" (1.562 m)  Weight: 176 lb (79.833 kg)   Body  mass index is 32.72 kg/(m^2).  Generalized: Well developed, in no acute distress   Neurological examination  Mentation: Alert oriented to time, place, history taking. Follows all commands speech and language fluent Cranial nerve II-XII: Pupils were equal round reactive to light. Extraocular movements were full, visual field were full on confrontational test. Facial sensation and strength were normal.  Uvula tongue midline. Head turning and shoulder shrug  were normal and symmetric. Motor: The motor testing reveals 5 over 5 strength of all 4 extremities. Good symmetric motor tone is noted throughout.  Sensory: Sensory testing is intact to soft touch on all 4 extremities. No evidence of extinction is noted.  Coordination: Cerebellar testing reveals good finger-nose-finger and heel-to-shin bilaterally.  Gait and station: Gait is normal. Tandem gait is slightly unsteady. Romberg is negative. No drift is seen.  Reflexes: Deep tendon reflexes are symmetric and normal bilaterally.    DIAGNOSTIC DATA (LABS, IMAGING, TESTING) - I reviewed patient records, labs, notes, testing and imaging myself where available.  Lab Results  Component Value Date   WBC 9.8 10/17/2013   HGB 10.3* 10/17/2013   HCT 30.7* 10/17/2013   MCV 85.0 10/17/2013   PLT 137* 10/17/2013      Component Value Date/Time   NA 138 10/14/2013 0500   K 4.6 10/14/2013 0500   CL 101 10/14/2013 0500   CO2 26 10/14/2013 0500   GLUCOSE 150* 10/14/2013 0500   BUN 14 10/14/2013 0500   CREATININE 0.85 10/14/2013 0500   CALCIUM 8.7 10/14/2013 0500   PROT 7.2 12/31/2012 1419   ALBUMIN 4.4 12/31/2012 1419   AST 15 12/31/2012 1419   ALT 12 12/31/2012 1419   ALKPHOS 70 12/31/2012 1419   BILITOT 0.5 12/31/2012 1419   GFRNONAA 60*  10/14/2013 0500   GFRAA 70* 10/14/2013 0500   Lab Results  Component Value Date   CHOL 166 12/31/2012   HDL 65.30 12/31/2012   LDLCALC 75 04/17/2012   LDLDIRECT 80.1 12/31/2012   TRIG 240.0* 12/31/2012   CHOLHDL 3 12/31/2012   Lab Results  Component Value Date   HGBA1C 6.1 07/01/2013   Lab Results  Component Value Date   VITAMINB12 339 04/10/2006   Lab Results  Component Value Date   TSH 0.76 12/31/2012      ASSESSMENT AND PLAN 80 y.o. year old female  has a past medical history of GERD (gastroesophageal reflux disease); Hyperlipidemia; History of colonic polyps; Diverticulosis; Shingles; Eczema; Allergic rhinoconjunctivitis; Lung nodule (2011); Environmental allergies; Arthritis; Anxiety; Stenosis of popliteal artery; COPD (chronic obstructive pulmonary disease); H/O hiatal hernia; Hypertension; Trigeminal neuralgia; Peripheral vascular disease; and DVT (deep venous thrombosis). here with:  1. trigeminal neuralgia  We will decrease the carbamazepine to 100 mg to see if that helps with her sleepiness. If her facial pain returns, she will have to go back to the 200 mg. The patient is doing well. She stays active in the community. She continues to work [employer]. She should follow-up in 1 year or sooner if needed.    Ward Givens, MSN, NP-C 01/09/2014, 10:44 AM [facility] [address], [place],  [address] [phone]  Note: This document was prepared with digital dictation and possible smart phrase technology. Any transcriptional errors that result from this process are unintentional.

## 2014-01-10 ENCOUNTER — Ambulatory Visit (INDEPENDENT_AMBULATORY_CARE_PROVIDER_SITE_OTHER): Payer: Medicare Other

## 2014-01-10 DIAGNOSIS — J309 Allergic rhinitis, unspecified: Secondary | ICD-10-CM

## 2014-01-17 ENCOUNTER — Ambulatory Visit (INDEPENDENT_AMBULATORY_CARE_PROVIDER_SITE_OTHER): Payer: Medicare Other

## 2014-01-17 DIAGNOSIS — J309 Allergic rhinitis, unspecified: Secondary | ICD-10-CM

## 2014-01-24 ENCOUNTER — Ambulatory Visit (INDEPENDENT_AMBULATORY_CARE_PROVIDER_SITE_OTHER): Payer: Medicare Other

## 2014-01-24 DIAGNOSIS — J309 Allergic rhinitis, unspecified: Secondary | ICD-10-CM

## 2014-01-25 ENCOUNTER — Other Ambulatory Visit: Payer: Self-pay | Admitting: Internal Medicine

## 2014-01-29 ENCOUNTER — Other Ambulatory Visit: Payer: Self-pay | Admitting: Internal Medicine

## 2014-01-30 ENCOUNTER — Ambulatory Visit: Payer: Medicare Other

## 2014-01-30 ENCOUNTER — Ambulatory Visit (INDEPENDENT_AMBULATORY_CARE_PROVIDER_SITE_OTHER): Payer: Medicare Other | Admitting: Family

## 2014-01-30 DIAGNOSIS — Z5181 Encounter for therapeutic drug level monitoring: Secondary | ICD-10-CM

## 2014-01-30 LAB — POCT INR: INR: 1.9

## 2014-01-30 NOTE — Patient Instructions (Signed)
Take and extra 1/2 tab today (thursday). Then continue 2 tablets (10 mg) all days except 1 1/2 tablets (7.5 mg) on Tuesday, Thursday and Saturday.  Re-check in 4 weeks.  Anticoagulation Dose Instructions as of 01/30/2014     [name] [name] Tue Wed Thu Fri Sat   New Dose 10 mg 10 mg 7.5 mg 10 mg 7.5 mg 10 mg 7.5 mg    Description       Take and extra 1/2 tab today (thursday). Then continue 2 tablets (10 mg) all days except 1 1/2 tablets (7.5 mg) on Tuesday, Thursday and Saturday.  Re-check in 4 weeks.

## 2014-01-31 ENCOUNTER — Ambulatory Visit (INDEPENDENT_AMBULATORY_CARE_PROVIDER_SITE_OTHER): Payer: Medicare Other

## 2014-01-31 DIAGNOSIS — J309 Allergic rhinitis, unspecified: Secondary | ICD-10-CM

## 2014-02-05 ENCOUNTER — Telehealth: Payer: Self-pay | Admitting: Internal Medicine

## 2014-02-05 MED ORDER — SIMVASTATIN 40 MG PO TABS: 40.0000 mg | ORAL_TABLET | Freq: Every day | ORAL | Status: DC

## 2014-02-05 NOTE — Telephone Encounter (Signed)
Medication refilled

## 2014-02-05 NOTE — Telephone Encounter (Signed)
Pt request refill  simvastatin (ZOCOR) 40 MG tablet 90 day w/ one refill will get her to appt w/ [facility]  Pt has attempted several times to get this filled

## 2014-02-07 ENCOUNTER — Ambulatory Visit (INDEPENDENT_AMBULATORY_CARE_PROVIDER_SITE_OTHER): Payer: Medicare Other

## 2014-02-07 DIAGNOSIS — J309 Allergic rhinitis, unspecified: Secondary | ICD-10-CM | POA: Diagnosis not present

## 2014-02-14 ENCOUNTER — Ambulatory Visit (INDEPENDENT_AMBULATORY_CARE_PROVIDER_SITE_OTHER): Payer: Medicare Other

## 2014-02-14 DIAGNOSIS — J309 Allergic rhinitis, unspecified: Secondary | ICD-10-CM | POA: Diagnosis not present

## 2014-02-19 ENCOUNTER — Encounter: Payer: Self-pay | Admitting: Family

## 2014-02-20 ENCOUNTER — Ambulatory Visit (HOSPITAL_COMMUNITY)
Admission: RE | Admit: 2014-02-20 | Discharge: 2014-02-20 | Disposition: A | Discharge status: Home / Self Care | Payer: Medicare Other | Source: Ambulatory Visit | Attending: Family | Admitting: Family

## 2014-02-20 ENCOUNTER — Ambulatory Visit (INDEPENDENT_AMBULATORY_CARE_PROVIDER_SITE_OTHER): Payer: Medicare Other | Admitting: Family

## 2014-02-20 ENCOUNTER — Encounter: Payer: Self-pay | Admitting: Family

## 2014-02-20 VITALS — BP 170/80 | HR 61 | Resp 16 | Ht 62.0 in | Wt 177.0 lb

## 2014-02-20 DIAGNOSIS — I739 Peripheral vascular disease, unspecified: Secondary | ICD-10-CM | POA: Insufficient documentation

## 2014-02-20 DIAGNOSIS — Z48812 Encounter for surgical aftercare following surgery on the circulatory system: Secondary | ICD-10-CM

## 2014-02-20 NOTE — Patient Instructions (Signed)

## 2014-02-20 NOTE — Progress Notes (Signed)
[facility] [place] HISTORY AND PHYSICAL -PAD  History of Present Illness [name] [name] is a 80 y.o. female patient of Dr. Oneida Alar who is status post bilateral femoropopliteal bypass with [name] [phone] and 2009), she's also had thrombolysis angioplasty of the right femoropopliteal bypass in 2010, again on 10/13/13,  and thrombectomy angioplasty right interpositional graft from above the knee to below the knee popliteal artery in 2010.  She returns today for follow up accompanied by her daughter.  On 10/13/13 she underwent arteriogram by interventional radiology at [facility] of her right lower extremity. Her arteriogram revealed an occluded right femoral to below-knee popliteal bypass graft. Lysis was initiated through the left common femoral artery with a 27F sheath. Following her procedure, she was placed on a heparin drip and started on coumadin. Her left femoral sheath site had no active bleeding or hematoma. She had palpable dorsalis pedis pulses. She stayed in the hospital for an additional day after her lysis intervention due to deconditioning. After working with physical therapy, she was recommended to go home with a rolling walker. The remainder of the hospital course consisted of increasing mobilization and increasing intake of solids without difficulty. She was discharged home with home health. She was prescribed coumadin and an appointment was made for her to follow-up on 10/21/13 to check her INR.   Her walking is limited by low back pain, she has had lumbar fusion in March, 2013, states she was told that she has arthritis in her back which is causing this pain.   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   Pt states her blood pressure usually runs 130-140/60's, states it goes up in a medical office.  She no longer has claudication symptoms with walking since the above vascular procedures, she denies non healing wounds.  She denies any history of stroke or TIA symptoms, denies  cardiac problems.  She has been making a concerted effort to lose weight.  The patient denies New Medical or Surgical History.   Pt Diabetic: No  Pt smoker: former smoker, quit about 1995  Pt meds include:  Statin :Yes  ASA: Yes  Other anticoagulants/antiplatelets: coumadin started by Dr. Oneida Alar July, 2015, and chronically managed by her PCP, was Dr. Arnoldo Morale, will be Dr. Yong Channel     Past Medical History  Diagnosis Date  . GERD (gastroesophageal reflux disease)   . Hyperlipidemia   . History of colonic polyps   . Diverticulosis   . Shingles   . Eczema   . Allergic rhinoconjunctivitis   . Lung nodule 2011  . Environmental allergies     allergy shot- q friday in Dr. Janee Morn office. PFT's abnormal- recommended Spiriva to use preop & will d/c after surgery  . Arthritis     low back , stenosis  . Anxiety     pt. managed- uses deep breathing   . Stenosis of popliteal artery     blood clots in legs long ago    . COPD (chronic obstructive pulmonary disease)   . H/O hiatal hernia   . Hypertension   . Trigeminal neuralgia   . Peripheral vascular disease   . DVT (deep venous thrombosis)     Social History History  Substance Use Topics  . Smoking status: Former Smoker -- 2.00 packs/day for 30 years    Types: Cigarettes    Quit date: 06/18/1993  . Smokeless tobacco: Never Used     Comment: QUIT IN 1995  . Alcohol Use: No  Family History Family History  Problem Relation Age of Onset  . Arthritis Mother   . Hypertension Mother   . Heart disease Father   . Colon polyps Father   . Breast cancer Sister   . Lung cancer Sister   . Colon cancer Sister   . Cancer Sister     Breast  . Lung cancer Brother   . Cancer Brother     Lung  . Anesthesia problems Neg Hx   . Hypotension Neg Hx   . Malignant hyperthermia Neg Hx   . Pseudochol deficiency Neg Hx   . Cancer Daughter     Breast    Past Surgical History  Procedure Laterality Date  . Tubal ligation    .  Cholecystectomy  1998  . Femoral-popliteal bypass graft         x2 surgeries 1990's & 2009  . Eye surgery      cataracts removed- bilateral /w IOL  . Tonsillectomy      as a teenager   . Tonsilectomy, adenoidectomy, bilateral myringotomy and tubes      no myringotomy tubes   . Lumbar fusion  07/06/2011  . Spine surgery  March 2013    Allergies  Allergen Reactions  . Sulfa Antibiotics Other (See Comments)    Cold sweat light headed and disorientation  . Tiotropium Bromide Shortness Of Breath and Other (See Comments)    Sore throat also  . Latex Itching and Rash    Current Outpatient Prescriptions  Medication Sig Dispense Refill  . Acetaminophen (TYLENOL ARTHRITIS PAIN PO) Take by mouth. Take as directed (usually once to twice daily)      . aspirin 81 MG tablet Take 81 mg by mouth daily.      . calcium-vitamin D (OSCAL WITH D 500-200) 500-200 MG-UNIT per tablet Take 1 tablet by mouth daily.        . carbamazepine (TEGRETOL) 200 MG tablet Take 0.5 tablets (100 mg total) by mouth at bedtime.      . Cholecalciferol (VITAMIN D3) 1000 UNITS CAPS Take 1,000 Units by mouth daily.       . cromolyn (OPTICROM) 4 % ophthalmic solution continuous as needed.      Marland Kitchen omeprazole (PRILOSEC) 20 MG capsule Take 20 mg by mouth every morning.       . simvastatin (ZOCOR) 40 MG tablet Take 1 tablet (40 mg total) by mouth daily at 6 PM.  90 tablet  1  . valsartan-hydrochlorothiazide (DIOVAN-HCT) 160-25 MG per tablet TAKE 1 TABLET BY MOUTH ONCE DAILY  90 tablet  1  . warfarin (COUMADIN) 5 MG tablet TAKE AS DIRECTED BY ANTICOAGULATION CLINIC  120 tablet  0   No current facility-administered medications for this visit.    ROS: See HPI for pertinent positives and negatives.   Physical Examination  Filed Vitals:   02/20/14 1043  BP: 170/80  Pulse: 61  Resp: 16  Height: 5\' 2"  (1.575 m)  Weight: 177 lb (80.287 kg)  SpO2: 96%   Body mass index is 32.37 kg/(m^2).  General: A&O x 3, WDWN, obese  female.  Gait: normal  Eyes: PERRLA.  Pulmonary: CTAB, without wheezes , rales or rhonchi.  Cardiac: regular Rythm , without detected murmur.   Carotid Bruits  Left  Right    Negative  Negative    Aorta is not palpable.  Radial pulses: 2+ palpable and =.   VASCULAR EXAM:  Extremities without ischemic changes  without Gangrene; without open wounds.  LE Pulses  LEFT  RIGHT   FEMORAL  not palpable  not palpable   POPLITEAL  2+ palpable  not palpable   POSTERIOR TIBIAL  not palpable  1+ palpable   DORSALIS PEDIS  ANTERIOR TIBIAL  2+palpable  2+palpable    Abdomen: soft, NT, no masses palpated.  Skin: no rashes, no ulcers noted.  Musculoskeletal: no muscle wasting or atrophy.  Neurologic: A&O X 3; Appropriate Affect ; SENSATION: normal; MOTOR FUNCTION: moving all extremities equally, motor strength 5/5 throughout. Speech is fluent/normal. CN 2-12 intact.       Non-Invasive Vascular Imaging: DATE: 02/20/2014 LOWER EXTREMITY ARTERIAL DUPLEX EVALUATION    INDICATION: Right femoropopliteal artery bypass graft placed 2006 (by [name])  with distal jump graft 12//2010 and thrombolysis  of graft 10/12/2013. Left femoral to popliteal bypass graft placed in 2006 by [name]    PREVIOUS INTERVENTION(S):     DUPLEX EXAM:     RIGHT  LEFT   Peak Systolic Velocity (cm/s) Ratio (if abnormal) Waveform  Peak Systolic Velocity (cm/s) Ratio (if abnormal) Waveform  144  B broad Inflow Artery 224  B  324  B Proximal Anastomosis 122  B  70  B Proximal Graft 126  B  55  B Mid Graft 51  B  52  B  Distal Graft 58  B  66  B Distal Anastomosis 119  B  76  B Outflow Artery 76  B  - Today's ABI / TBI -  - Previous ABI / TBI (  ) -    Waveform:    M - Monophasic       B - Biphasic       T - Triphasic  If Ankle Brachial Index (ABI) or Toe Brachial Index (TBI) performed, please see complete report  ADDITIONAL FINDINGS: Plaque visualized in the right inflow artery/proximal anastomosis     IMPRESSION: 1. Patent right lower extremity bypass graft with 50 - 79% stenosis of the inflow artery/proximal anastomosis. 2. Patent left femoral to popliteal bypass graft with no evidence for restenosis.    Compared to the previous exam:  No significant change.     ASSESSMENT: [name] [name] is a 80 y.o. female who is status post bilateral femoropopliteal bypass with [name] [phone] and 2009), she's also had thrombolysis angioplasty of the right femoropopliteal bypass in 2010, again on 10/13/13,  and thrombectomy angioplasty right interpositional graft from above the knee to below the knee popliteal artery in 2010.  Her walking is limited by back pain. She no longer has claudication symptoms with walking since the above vascular procedures, she denies non healing wounds.  Today's bilateral LE arterial Duplex reveals a patent right lower extremity bypass graft with 50 - 79% stenosis of the inflow artery/proximal anastomosis and a patent left femoral to popliteal bypass graft with no evidence for restenosis. No significant change from the previous Duplex.   PLAN:  Graduated walking program, continue daily leg exercises.  I discussed in depth with the patient the nature of atherosclerosis, and emphasized the importance of maximal medical management including strict control of blood pressure, blood glucose, and lipid levels, obtaining regular exercise, and continued cessation of smoking.  The patient is aware that without maximal medical management the underlying atherosclerotic disease process will progress, limiting the benefit of any interventions.  Based on the patient's vascular studies and examination, and after discussing with Dr. Oneida Alar,  pt will return to clinic in 3 months with ABI's and  bilateral LE arterial Duplex, follow up with Dr. Oneida Alar.   The patient was given information about PAD including signs, symptoms, treatment, what symptoms should prompt the patient to seek  immediate medical care, and risk reduction measures to take.  Clemon Chambers, RN, MSN, FNP-C [facility] Phone: [phone]  Clinic MD: [facility]  02/20/2014 10:44 AM

## 2014-02-21 ENCOUNTER — Ambulatory Visit (INDEPENDENT_AMBULATORY_CARE_PROVIDER_SITE_OTHER): Payer: Medicare Other

## 2014-02-21 DIAGNOSIS — J309 Allergic rhinitis, unspecified: Secondary | ICD-10-CM | POA: Diagnosis not present

## 2014-02-26 ENCOUNTER — Encounter: Payer: Self-pay | Admitting: Internal Medicine

## 2014-02-27 ENCOUNTER — Ambulatory Visit (INDEPENDENT_AMBULATORY_CARE_PROVIDER_SITE_OTHER): Payer: Medicare Other | Admitting: Family

## 2014-02-27 DIAGNOSIS — Z5181 Encounter for therapeutic drug level monitoring: Secondary | ICD-10-CM | POA: Diagnosis not present

## 2014-02-27 LAB — POCT INR: INR: 1.5

## 2014-02-27 NOTE — Patient Instructions (Signed)
Today, take an extra tab. Then continue 2 tablets (10 mg) all days except 1 1/2 tablets (7.5 mg) on Tuesday, Thursday.  Re-check in 4 weeks.  Anticoagulation Dose Instructions as of 02/27/2014      [name] [name] Tue Wed Thu Fri Sat   New Dose 10 mg 10 mg 7.5 mg 10 mg 7.5 mg 10 mg 10 mg    Description        Today, take an extra tab. Then continue 2 tablets (10 mg) all days except 1 1/2 tablets (7.5 mg) on Tuesday, Thursday.  Re-check in 4 weeks.

## 2014-02-28 ENCOUNTER — Ambulatory Visit (INDEPENDENT_AMBULATORY_CARE_PROVIDER_SITE_OTHER): Payer: Medicare Other

## 2014-02-28 DIAGNOSIS — J309 Allergic rhinitis, unspecified: Secondary | ICD-10-CM

## 2014-03-07 ENCOUNTER — Ambulatory Visit (INDEPENDENT_AMBULATORY_CARE_PROVIDER_SITE_OTHER): Payer: Medicare Other

## 2014-03-07 DIAGNOSIS — J309 Allergic rhinitis, unspecified: Secondary | ICD-10-CM | POA: Diagnosis not present

## 2014-03-11 ENCOUNTER — Other Ambulatory Visit: Payer: Self-pay | Admitting: Family

## 2014-03-13 ENCOUNTER — Encounter: Payer: Self-pay | Admitting: Family Medicine

## 2014-03-13 ENCOUNTER — Ambulatory Visit (INDEPENDENT_AMBULATORY_CARE_PROVIDER_SITE_OTHER): Payer: Medicare Other | Admitting: Family Medicine

## 2014-03-13 ENCOUNTER — Ambulatory Visit (INDEPENDENT_AMBULATORY_CARE_PROVIDER_SITE_OTHER): Payer: Medicare Other

## 2014-03-13 VITALS — BP 140/80 | HR 68 | Temp 98.6°F | Wt 172.0 lb

## 2014-03-13 DIAGNOSIS — M79674 Pain in right toe(s): Secondary | ICD-10-CM

## 2014-03-13 DIAGNOSIS — Z5181 Encounter for therapeutic drug level monitoring: Secondary | ICD-10-CM | POA: Diagnosis not present

## 2014-03-13 DIAGNOSIS — I749 Embolism and thrombosis of unspecified artery: Secondary | ICD-10-CM | POA: Diagnosis not present

## 2014-03-13 LAB — POCT INR: INR: 1.6

## 2014-03-13 MED ORDER — PREDNISONE 20 MG PO TABS: 20.0000 mg | ORAL_TABLET | Freq: Every day | ORAL | Status: DC

## 2014-03-13 NOTE — Progress Notes (Signed)
[name], MD Phone: [phone]  Subjective:   [name] [name] is a 80 y.o. year old very pleasant female patient who presents with the following:  Right toe pain INR below goal Dr. Oneida Alar vascular surgeon. Does not describe DVT history (lab had led me to believe history of DVT) but instead arterial history. She has had had 3 surgeries which sound like vascular reperfusion. One was after intense pain in right calf down to ankle. The other was a cold feeling throughout left leg. The inability to walk was final presenting symptom in left leg. Review of notes from vascular show PVD.   Patient describes toe pain at right MTP great toe. Started hurting last night. Felt like had stubbed her toe but had not. No history gout. History of arthritis in hips. Never had pain like this before. Tried tylenol with only mild relief. Aching pain. Resting right now pain 3/4 but pain up to 7/8 with walking. No injury or fall. No coolness to foot or erythema/warmth.   ROS-no paresthesias . No fever/chills/shortness of breath. No calf pain or leg pain. No coolness to leg.   Past Medical History- Patient Active Problem List   Diagnosis Date Noted  . Aftercare following surgery of the circulatory system 02/20/2014  . Embolism and thrombosis of unspecified site 10/21/2013  . Encounter for therapeutic drug monitoring 10/21/2013  . PVD (peripheral vascular disease) 07/25/2013  . Syncope 12/29/2011  . Lumbar pain with radiation down right leg 09/13/2010  . Allergic rhinitis due to pollen 06/25/2010  . CHRONIC OBSTRUCTIVE PULMONARY DISEASE 01/22/2010  . PULMONARY NODULE, RIGHT LOWER LOBE 01/12/2010  . TOBACCO ABUSE, HX OF 01/11/2010  . IMPAIRED GLUCOSE TOLERANCE 01/06/2009  . OSTEOPOROSIS 01/06/2009  . HIP PAIN, LEFT 11/05/2008  . CONJUNCTIVITIS, ALLERGIC 08/04/2008  . POSTHERPETIC NEURALGIA 11/26/2007  . ECZEMA 11/26/2007  . PERIPHERAL VASCULAR DISEASE 06/29/2007  . COLITIS 12/20/2006  . ANXIETY  STATE NEC 12/04/2006  . DIVERTICULOSIS, COLON 12/04/2006  . HYPERLIPIDEMIA 10/23/2006  . HYPERTENSION 10/23/2006  . COLONIC POLYPS, RECURRENT 08/29/2006  . HIATAL HERNIA, HX OF 03/25/1999  . ESOPHAGEAL STRICTURE 08/09/1993   Medications- reviewed and updated Current Outpatient Prescriptions  Medication Sig Dispense Refill  . Acetaminophen (TYLENOL ARTHRITIS PAIN PO) Take by mouth. Take as directed (usually once to twice daily)    . aspirin 81 MG tablet Take 81 mg by mouth daily.    . calcium-vitamin D (OSCAL WITH D 500-200) 500-200 MG-UNIT per tablet Take 1 tablet by mouth daily.      . carbamazepine (TEGRETOL) 200 MG tablet Take 0.5 tablets (100 mg total) by mouth at bedtime.    . Cholecalciferol (VITAMIN D3) 1000 UNITS CAPS Take 1,000 Units by mouth daily.     . cromolyn (OPTICROM) 4 % ophthalmic solution continuous as needed.    Marland Kitchen omeprazole (PRILOSEC) 20 MG capsule Take 20 mg by mouth every morning.     . simvastatin (ZOCOR) 40 MG tablet Take 1 tablet (40 mg total) by mouth daily at 6 PM. 90 tablet 1  . valsartan-hydrochlorothiazide (DIOVAN-HCT) 160-25 MG per tablet TAKE 1 TABLET BY MOUTH ONCE DAILY 90 tablet 1  . warfarin (COUMADIN) 5 MG tablet TAKE AS DIRECTED BY ANTICOAGULATION CLINIC 120 tablet 1   No current facility-administered medications for this visit.    Objective: BP 140/80 mmHg  Pulse 68  Temp(Src) 98.6 F (37 C) (Oral)  Wt 172 lb (78.019 kg)  SpO2 98% Ext: Pain at MTP joint with palpation and with motion of  joint. No obvious warmth or swelling 2+ DP pulses. No pain in any other portion of foot. No coolness to foot or abnormal warmth. No pallor.  Skin: warm, dry, no rash on foot Neuro: intact distal sensation  Assessment/Plan:  Right toe pain INR below goal I saw patient as a work in for lab after they reported decreased INR and patient with history of blood clots in her leg with current lower extremity pain. The patient was very concerned about potential  clot.  Patient has good pulses in right foot. Her pain is at MTP joint and I think this is likely osteoarthritis with acute flare. Could also be gout or pseudogout. She states she's been told not to use NSAIDs before for unclear reason. For this reason we will trial prednisone 20 mg 5 days. I comforted patient did not think this was related to her vascular disease history. I gave her warning signs first and return.  Regarding INR of 1.6 with goal to, we will give patient an extra 2.5 mg today and tomorrow. She is currently taking 65 mg per week. She will see Dr. Megan Salon in Coumadin clinic on Monday for recheck.  Decision making high risk due to peripheral vascular disease history.  Meds ordered this encounter  Medications  . predniSONE (DELTASONE) 20 MG tablet    Sig: Take 1 tablet (20 mg total) by mouth daily with breakfast.    Dispense:  5 tablet    Refill:  0

## 2014-03-13 NOTE — Patient Instructions (Addendum)
I think this is arthritis of your first right toe at the MTP joint.   I think this is very unlikely to be related to your poor blood flow. You have 2+ pulses in your foot which is really reassuring. Still, i fyou had new or worsening symptoms you need to seek care.   Since we want to avoid ibuprofen type products let's try prednisone low dose for 5 days. Can still take tylenol.   Today take 2 pills of coumadin, tomorrow take 2.5 pills, return to normal schedule aft that. Then see Dr. Megan Salon  on Monday

## 2014-03-13 NOTE — Progress Notes (Signed)
Pre visit review using our clinic review tool, if applicable. No additional management support is needed unless otherwise documented below in the visit note. 

## 2014-03-14 ENCOUNTER — Ambulatory Visit (INDEPENDENT_AMBULATORY_CARE_PROVIDER_SITE_OTHER): Payer: Medicare Other

## 2014-03-14 DIAGNOSIS — J309 Allergic rhinitis, unspecified: Secondary | ICD-10-CM | POA: Diagnosis not present

## 2014-03-17 ENCOUNTER — Encounter: Payer: Self-pay | Admitting: Internal Medicine

## 2014-03-17 ENCOUNTER — Ambulatory Visit (INDEPENDENT_AMBULATORY_CARE_PROVIDER_SITE_OTHER): Payer: Medicare Other | Admitting: Family

## 2014-03-17 DIAGNOSIS — Z5181 Encounter for therapeutic drug level monitoring: Secondary | ICD-10-CM | POA: Diagnosis not present

## 2014-03-17 LAB — POCT INR: INR: 2.1

## 2014-03-17 NOTE — Patient Instructions (Signed)
Continue 2 tablets (10 mg) all days except 1 1/2 tablets (7.5 mg) on Tuesday, Thursday.  Re-check in 3 weeks.   Anticoagulation Dose Instructions as of 03/17/2014      [name] Tue Wed Thu Fri Sat   New Dose 10 mg 10 mg 7.5 mg 10 mg 7.5 mg 10 mg 10 mg    Description        Continue 2 tablets (10 mg) all days except 1 1/2 tablets (7.5 mg) on Tuesday, Thursday.  Re-check in 3 weeks.

## 2014-03-18 ENCOUNTER — Ambulatory Visit (INDEPENDENT_AMBULATORY_CARE_PROVIDER_SITE_OTHER): Payer: Medicare Other

## 2014-03-18 DIAGNOSIS — J309 Allergic rhinitis, unspecified: Secondary | ICD-10-CM | POA: Diagnosis not present

## 2014-03-26 DIAGNOSIS — Z1231 Encounter for screening mammogram for malignant neoplasm of breast: Secondary | ICD-10-CM | POA: Diagnosis not present

## 2014-03-26 LAB — HM MAMMOGRAPHY: HM Mammogram: NORMAL

## 2014-03-27 ENCOUNTER — Encounter: Payer: Self-pay | Admitting: Family Medicine

## 2014-03-27 DIAGNOSIS — L218 Other seborrheic dermatitis: Secondary | ICD-10-CM | POA: Diagnosis not present

## 2014-03-28 ENCOUNTER — Ambulatory Visit (INDEPENDENT_AMBULATORY_CARE_PROVIDER_SITE_OTHER): Payer: Medicare Other

## 2014-03-28 DIAGNOSIS — J309 Allergic rhinitis, unspecified: Secondary | ICD-10-CM | POA: Diagnosis not present

## 2014-04-02 ENCOUNTER — Ambulatory Visit (INDEPENDENT_AMBULATORY_CARE_PROVIDER_SITE_OTHER): Payer: Medicare Other

## 2014-04-02 DIAGNOSIS — J309 Allergic rhinitis, unspecified: Secondary | ICD-10-CM | POA: Diagnosis not present

## 2014-04-03 ENCOUNTER — Encounter (HOSPITAL_COMMUNITY): Payer: Self-pay | Admitting: Vascular Surgery

## 2014-04-04 ENCOUNTER — Ambulatory Visit (INDEPENDENT_AMBULATORY_CARE_PROVIDER_SITE_OTHER): Payer: Medicare Other

## 2014-04-04 DIAGNOSIS — J309 Allergic rhinitis, unspecified: Secondary | ICD-10-CM | POA: Diagnosis not present

## 2014-04-07 ENCOUNTER — Ambulatory Visit: Payer: Medicare Other | Admitting: Family

## 2014-04-09 ENCOUNTER — Ambulatory Visit: Payer: Medicare Other

## 2014-04-09 ENCOUNTER — Ambulatory Visit: Payer: Medicare Other | Admitting: Family

## 2014-04-09 ENCOUNTER — Ambulatory Visit (INDEPENDENT_AMBULATORY_CARE_PROVIDER_SITE_OTHER): Payer: Medicare Other | Admitting: Family Medicine

## 2014-04-09 DIAGNOSIS — Z5181 Encounter for therapeutic drug level monitoring: Secondary | ICD-10-CM

## 2014-04-09 LAB — POCT INR: INR: 1.7

## 2014-04-09 NOTE — Patient Instructions (Signed)
Continue 2 tablets (10 mg) all days except 1 1/2 tablets (7.5 mg) on Tuesday.  Re-check in 2 weeks.   Anticoagulation Dose Instructions as of 04/09/2014      [name] Tue Wed Thu Fri Sat   New Dose 10 mg 10 mg 7.5 mg 10 mg 10 mg 10 mg 10 mg    Description        Continue 2 tablets (10 mg) all days except 1 1/2 tablets (7.5 mg) on Tuesday.  Re-check in 2 weeks.

## 2014-04-11 ENCOUNTER — Ambulatory Visit (INDEPENDENT_AMBULATORY_CARE_PROVIDER_SITE_OTHER): Payer: Medicare Other

## 2014-04-11 DIAGNOSIS — J309 Allergic rhinitis, unspecified: Secondary | ICD-10-CM | POA: Diagnosis not present

## 2014-04-17 ENCOUNTER — Ambulatory Visit: Payer: Medicare Other

## 2014-04-23 ENCOUNTER — Ambulatory Visit (INDEPENDENT_AMBULATORY_CARE_PROVIDER_SITE_OTHER): Payer: Medicare Other

## 2014-04-23 ENCOUNTER — Ambulatory Visit (INDEPENDENT_AMBULATORY_CARE_PROVIDER_SITE_OTHER): Payer: Medicare Other | Admitting: Family

## 2014-04-23 DIAGNOSIS — J309 Allergic rhinitis, unspecified: Secondary | ICD-10-CM

## 2014-04-23 DIAGNOSIS — Z5181 Encounter for therapeutic drug level monitoring: Secondary | ICD-10-CM | POA: Diagnosis not present

## 2014-04-23 LAB — POCT INR: INR: 2.5

## 2014-04-23 NOTE — Patient Instructions (Signed)
Continue 2 tablets (10 mg) all days except 1 1/2 tablets (7.5 mg) on Tuesday.   Anticoagulation Dose Instructions as of 04/23/2014      [name] Tue Wed Thu Fri Sat   New Dose 10 mg 10 mg 7.5 mg 10 mg 10 mg 10 mg 10 mg    Description        Continue 2 tablets (10 mg) all days except 1 1/2 tablets (7.5 mg) on Tuesday.

## 2014-04-30 ENCOUNTER — Ambulatory Visit: Payer: Medicare Other

## 2014-05-02 ENCOUNTER — Ambulatory Visit (INDEPENDENT_AMBULATORY_CARE_PROVIDER_SITE_OTHER): Payer: Medicare Other

## 2014-05-02 DIAGNOSIS — J309 Allergic rhinitis, unspecified: Secondary | ICD-10-CM

## 2014-05-09 ENCOUNTER — Ambulatory Visit (INDEPENDENT_AMBULATORY_CARE_PROVIDER_SITE_OTHER): Payer: Medicare Other

## 2014-05-09 DIAGNOSIS — J309 Allergic rhinitis, unspecified: Secondary | ICD-10-CM

## 2014-05-12 ENCOUNTER — Ambulatory Visit (INDEPENDENT_AMBULATORY_CARE_PROVIDER_SITE_OTHER): Payer: Medicare Other | Admitting: Internal Medicine

## 2014-05-12 ENCOUNTER — Encounter: Payer: Self-pay | Admitting: Internal Medicine

## 2014-05-12 VITALS — BP 140/76 | Temp 97.8°F | Wt 179.0 lb

## 2014-05-12 DIAGNOSIS — M79602 Pain in left arm: Secondary | ICD-10-CM | POA: Diagnosis not present

## 2014-05-12 DIAGNOSIS — Z5181 Encounter for therapeutic drug level monitoring: Secondary | ICD-10-CM | POA: Diagnosis not present

## 2014-05-12 DIAGNOSIS — I10 Essential (primary) hypertension: Secondary | ICD-10-CM | POA: Diagnosis not present

## 2014-05-12 MED ORDER — TRAMADOL HCL 50 MG PO TABS: 50.0000 mg | ORAL_TABLET | Freq: Three times a day (TID) | ORAL | Status: DC | PRN

## 2014-05-12 NOTE — Progress Notes (Signed)
Subjective:    Patient ID: [name] [name], female    DOB: 1925-03-01, 81 y.o.   MRN: [id number]  HPI  81 year old patient who presents with a one-day history of left elbow pain.  She states she fell asleep while watching TV yesterday sleeping on her left side.  When she awoke, she had considerable discomfort in the left elbow that has persisted.  She has had a difficult time getting dressed due to the left elbow discomfort.  Pain is aggravated by movement and resolved at rest  Past Medical History  Diagnosis Date  . GERD (gastroesophageal reflux disease)   . Hyperlipidemia   . History of colonic polyps   . Diverticulosis   . Shingles   . Eczema   . Allergic rhinoconjunctivitis   . Lung nodule 2011  . Environmental allergies     allergy shot- q friday in Dr. Janee Morn office. PFT's abnormal- recommended Spiriva to use preop & will d/c after surgery  . Arthritis     low back , stenosis  . Anxiety     pt. managed- uses deep breathing   . Stenosis of popliteal artery     blood clots in legs long ago    . COPD (chronic obstructive pulmonary disease)   . H/O hiatal hernia   . Hypertension   . Trigeminal neuralgia   . Peripheral vascular disease   . DVT (deep venous thrombosis)     History   Social History  . Marital Status: Single    Spouse Name: N/A    Number of Children: 4  . Years of Education: 12   Occupational History  .     Social History Main Topics  . Smoking status: Former Smoker -- 2.00 packs/day for 30 years    Types: Cigarettes    Quit date: 06/18/1993  . Smokeless tobacco: Never Used     Comment: QUIT IN 1995  . Alcohol Use: No  . Drug Use: No  . Sexual Activity: Not on file   Other Topics Concern  . Not on file   Social History Narrative   Patient is single with 4 children.   Patient is right handed.   Patient has high school education.   Patient drinks 1 cup daily.    Past Surgical History  Procedure Laterality Date  . Tubal ligation    .  Cholecystectomy  1998  . Femoral-popliteal bypass graft         x2 surgeries 1990's & 2009  . Eye surgery      cataracts removed- bilateral /w IOL  . Tonsillectomy      as a teenager   . Tonsilectomy, adenoidectomy, bilateral myringotomy and tubes      no myringotomy tubes   . Lumbar fusion  07/06/2011  . Spine surgery  March 2013  . Abdominal aortagram N/A 06/17/2011    Procedure: ABDOMINAL Maxcine Ham;  Surgeon: Elam Dutch, MD;  Location: [facility] CATH LAB;  Service: Cardiovascular;  Laterality: N/A;    Family History  Problem Relation Age of Onset  . Arthritis Mother   . Hypertension Mother   . Heart disease Father   . Colon polyps Father   . Breast cancer Sister   . Lung cancer Sister   . Colon cancer Sister   . Cancer Sister     Breast  . Lung cancer Brother   . Cancer Brother     Lung  . Anesthesia problems Neg Hx   . Hypotension Neg Hx   .  Malignant hyperthermia Neg Hx   . Pseudochol deficiency Neg Hx   . Cancer Daughter     Breast    Allergies  Allergen Reactions  . Sulfa Antibiotics Other (See Comments)    Cold sweat light headed and disorientation  . Tiotropium Bromide Shortness Of Breath and Other (See Comments)    Sore throat also  . Latex Itching and Rash    Current Outpatient Prescriptions on File Prior to Visit  Medication Sig Dispense Refill  . Acetaminophen (TYLENOL ARTHRITIS PAIN PO) Take by mouth. Take as directed (usually once to twice daily)    . aspirin 81 MG tablet Take 81 mg by mouth daily.    . calcium-vitamin D (OSCAL WITH D 500-200) 500-200 MG-UNIT per tablet Take 1 tablet by mouth daily.      . carbamazepine (TEGRETOL) 200 MG tablet Take 0.5 tablets (100 mg total) by mouth at bedtime.    . Cholecalciferol (VITAMIN D3) 1000 UNITS CAPS Take 1,000 Units by mouth daily.     . cromolyn (OPTICROM) 4 % ophthalmic solution continuous as needed.    Marland Kitchen omeprazole (PRILOSEC) 20 MG capsule Take 20 mg by mouth every morning.     . simvastatin (ZOCOR)  40 MG tablet Take 1 tablet (40 mg total) by mouth daily at 6 PM. 90 tablet 1  . valsartan-hydrochlorothiazide (DIOVAN-HCT) 160-25 MG per tablet TAKE 1 TABLET BY MOUTH ONCE DAILY 90 tablet 1  . warfarin (COUMADIN) 5 MG tablet TAKE AS DIRECTED BY ANTICOAGULATION CLINIC 120 tablet 1   No current facility-administered medications on file prior to visit.    BP 140/76 mmHg  Temp(Src) 97.8 F (36.6 C)  Wt 179 lb (81.194 kg)      Review of Systems  Musculoskeletal:       Left elbow pain       Objective:   Physical Exam  Constitutional: She appears well-developed and well-nourished.  Musculoskeletal:  No inflammatory changes about the left elbow Elbow, especially in the antecubital fossa is tender with flexion and extension of the arm.  No tenderness over the lateral aspect of the elbow          Assessment & Plan:   Traumatic left elbow pain.  The patient will place her left arm in a sling and will observe.  The patient is on Coumadin anticoagulation and will treat with tramadol if needed Avoid painful motions and activities.  Apply ice to the sore area for 15 to 20 minutes 3 or 4 times daily for the next two to 3 days.

## 2014-05-12 NOTE — Patient Instructions (Signed)
You  may move around, but avoid painful motions and activities.  Apply ice to the sore area for 15 to 20 minutes 3 or 4 times daily for the next two to 3 days. 

## 2014-05-16 ENCOUNTER — Ambulatory Visit: Payer: Medicare Other

## 2014-05-21 ENCOUNTER — Ambulatory Visit: Payer: Medicare Other | Admitting: Family

## 2014-05-22 ENCOUNTER — Ambulatory Visit: Payer: Medicare Other | Admitting: Vascular Surgery

## 2014-05-22 ENCOUNTER — Ambulatory Visit (INDEPENDENT_AMBULATORY_CARE_PROVIDER_SITE_OTHER): Payer: Medicare Other | Admitting: Family

## 2014-05-22 ENCOUNTER — Encounter (HOSPITAL_COMMUNITY): Payer: Medicare Other

## 2014-05-22 ENCOUNTER — Other Ambulatory Visit (HOSPITAL_COMMUNITY): Payer: Medicare Other

## 2014-05-22 DIAGNOSIS — Z5181 Encounter for therapeutic drug level monitoring: Secondary | ICD-10-CM | POA: Diagnosis not present

## 2014-05-22 LAB — POCT INR: INR: 1.6

## 2014-05-22 NOTE — Patient Instructions (Signed)
Take an extra 1/2 today and tomorrow then continue 2 tablets (10 mg) all days except 1 1/2 tablets (7.5 mg) on Tuesday. Recheck in 2 weeks    Anticoagulation Dose Instructions as of 05/22/2014      [name] Tue Wed Thu Fri Sat   New Dose 10 mg 10 mg 7.5 mg 10 mg 10 mg 10 mg 10 mg    Description        Take an extra 1/2 today and tomorrow then continue 2 tablets (10 mg) all days except 1 1/2 tablets (7.5 mg) on Tuesday. Recheck in 2 weeks

## 2014-05-23 ENCOUNTER — Ambulatory Visit (INDEPENDENT_AMBULATORY_CARE_PROVIDER_SITE_OTHER): Payer: Medicare Other

## 2014-05-23 DIAGNOSIS — J309 Allergic rhinitis, unspecified: Secondary | ICD-10-CM

## 2014-05-29 ENCOUNTER — Ambulatory Visit: Payer: Medicare Other | Admitting: Vascular Surgery

## 2014-05-29 ENCOUNTER — Encounter (HOSPITAL_COMMUNITY): Payer: Medicare Other

## 2014-05-29 ENCOUNTER — Other Ambulatory Visit (HOSPITAL_COMMUNITY): Payer: Medicare Other

## 2014-05-30 ENCOUNTER — Ambulatory Visit (INDEPENDENT_AMBULATORY_CARE_PROVIDER_SITE_OTHER): Payer: Medicare Other

## 2014-05-30 DIAGNOSIS — J309 Allergic rhinitis, unspecified: Secondary | ICD-10-CM | POA: Diagnosis not present

## 2014-06-05 ENCOUNTER — Ambulatory Visit (INDEPENDENT_AMBULATORY_CARE_PROVIDER_SITE_OTHER): Payer: Medicare Other | Admitting: General Practice

## 2014-06-05 ENCOUNTER — Encounter: Payer: Self-pay | Admitting: Family Medicine

## 2014-06-05 ENCOUNTER — Ambulatory Visit (INDEPENDENT_AMBULATORY_CARE_PROVIDER_SITE_OTHER): Payer: Medicare Other | Admitting: Family Medicine

## 2014-06-05 VITALS — BP 142/58 | Temp 98.1°F | Wt 177.0 lb

## 2014-06-05 DIAGNOSIS — G500 Trigeminal neuralgia: Secondary | ICD-10-CM | POA: Insufficient documentation

## 2014-06-05 DIAGNOSIS — R739 Hyperglycemia, unspecified: Secondary | ICD-10-CM

## 2014-06-05 DIAGNOSIS — Z5181 Encounter for therapeutic drug level monitoring: Secondary | ICD-10-CM | POA: Diagnosis not present

## 2014-06-05 DIAGNOSIS — E785 Hyperlipidemia, unspecified: Secondary | ICD-10-CM

## 2014-06-05 DIAGNOSIS — I10 Essential (primary) hypertension: Secondary | ICD-10-CM | POA: Diagnosis not present

## 2014-06-05 DIAGNOSIS — I739 Peripheral vascular disease, unspecified: Secondary | ICD-10-CM | POA: Diagnosis not present

## 2014-06-05 LAB — BASIC METABOLIC PANEL
BUN: 23 mg/dL (ref 6–23)
CO2: 32 mEq/L (ref 19–32)
Calcium: 9.4 mg/dL (ref 8.4–10.5)
Chloride: 103 mEq/L (ref 96–112)
Creatinine, Ser: 0.99 mg/dL (ref 0.40–1.20)
GFR: 56.32 mL/min — ABNORMAL LOW (ref >60.00)
Glucose, Bld: 136 mg/dL — ABNORMAL HIGH (ref 70–99)
Potassium: 4.7 mEq/L (ref 3.5–5.1)
Sodium: 140 mEq/L (ref 135–145)

## 2014-06-05 LAB — POCT INR: INR: 1.7

## 2014-06-05 LAB — LDL CHOLESTEROL, DIRECT: Direct LDL: 70 mg/dL

## 2014-06-05 LAB — HEMOGLOBIN A1C: Hgb A1c MFr Bld: 6 % (ref 4.6–6.5)

## 2014-06-05 NOTE — Progress Notes (Signed)
Pre visit review using our clinic review tool, if applicable. No additional management support is needed unless otherwise documented below in the visit note. 

## 2014-06-05 NOTE — Assessment & Plan Note (Signed)
Continue warfarin, asa, simvastatin. See HTN discussion about need to push BP lower and requests for Dr. Oneida Alar input on 1 week follow up.

## 2014-06-05 NOTE — Progress Notes (Signed)
[name], MD Phone: [phone]  Subjective:  Patient presents today to establish care with me as their new primary care provider. Patient was formerly a patient of Dr. Arnoldo Morale. Chief complaint-noted.   Hypertension-mild poor control on valsartan 160-25mg  considering PAD  BP Readings from Last 3 Encounters:  06/05/14 142/58  05/12/14 140/76  03/13/14 140/80  Home BP monitoring-no Compliant with medications-yes without side effects ROS-Denies any CP, HA, SOB, blurry vision.   Peripheral arterial disease- presume stable Follows with Dr. Oneida Alar. Medical management currently. 1 week follow up planne.d on warfarin, asa, simvastatin, BP control ROS- walks only short distances due to claudication  Hyperlipidemia-controlled on last check  Lab Results  Component Value Date   LDLCALC 75 04/17/2012  On statin: simvastatin 40mg  Regular exercise: limited by claudication ROS- no chest pain or shortness of breath. No myalgias   The following were reviewed and entered/updated in epic: Past Medical History  Diagnosis Date  . GERD (gastroesophageal reflux disease)   . Hyperlipidemia   . History of colonic polyps   . Diverticulosis   . Shingles   . Eczema   . Allergic rhinoconjunctivitis   . Lung nodule 2011  . Environmental allergies     allergy shot- q friday in Dr. Janee Morn office. PFT's abnormal- recommended Spiriva to use preop & will d/c after surgery  . Arthritis     low back , stenosis  . Anxiety     pt. managed- uses deep breathing   . Stenosis of popliteal artery     blood clots in legs long ago    . COPD (chronic obstructive pulmonary disease)   . H/O hiatal hernia   . Hypertension   . Trigeminal neuralgia   . Peripheral vascular disease   . DVT (deep venous thrombosis)   . COLONIC POLYPS, RECURRENT 08/29/2006    2008 last colonoscopy. No further colonoscopy.      Patient Active Problem List   Diagnosis Date Noted  . Peripheral arterial disease 06/29/2007   Priority: High  . COPD (chronic obstructive pulmonary disease) 01/22/2010    Priority: Medium  . IMPAIRED GLUCOSE TOLERANCE 01/06/2009    Priority: Medium  . Postherpetic neuralgia 11/26/2007    Priority: Medium  . Hyperlipidemia 10/23/2006    Priority: Medium  . Essential hypertension 10/23/2006    Priority: Medium  . ESOPHAGEAL STRICTURE 08/09/1993    Priority: Medium  . Encounter for therapeutic drug monitoring 10/21/2013    Priority: Low  . Syncope 12/29/2011    Priority: Low  . Allergic rhinitis due to pollen 06/25/2010    Priority: Low  . Solitary pulmonary nodule 01/12/2010    Priority: Low  . Osteopenia 01/06/2009    Priority: Low  . ECZEMA 11/26/2007    Priority: Low  . Trigeminal neuralgia    Past Surgical History  Procedure Laterality Date  . Tubal ligation    . Cholecystectomy  1998  . Femoral-popliteal bypass graft         x2 surgeries 1990's & 2009  . Eye surgery      cataracts removed- bilateral /w IOL  . Tonsillectomy      as a teenager   . Lumbar fusion  07/06/2011  . Abdominal aortagram N/A 06/17/2011    Procedure: ABDOMINAL Maxcine Ham;  Surgeon: Elam Dutch, MD;  Location: [facility] CATH LAB;  Service: Cardiovascular;  Laterality: N/A;    Family History  Problem Relation Age of Onset  . Arthritis Mother   . Hypertension Mother   .  Heart disease Father   . Colon polyps Father   . Breast cancer Sister   . Lung cancer Sister   . Colon cancer Sister   . Cancer Sister     Breast  . Lung cancer Brother   . Cancer Brother     Lung  . Anesthesia problems Neg Hx   . Hypotension Neg Hx   . Malignant hyperthermia Neg Hx   . Pseudochol deficiency Neg Hx   . Cancer Daughter     Breast    Medications- reviewed and updated Current Outpatient Prescriptions  Medication Sig Dispense Refill  . aspirin 81 MG tablet Take 81 mg by mouth daily.    . calcium-vitamin D (OSCAL WITH D 500-200) 500-200 MG-UNIT per tablet Take 1 tablet by mouth daily.      .  carbamazepine (TEGRETOL) 200 MG tablet Take 0.5 tablets (100 mg total) by mouth at bedtime.    . Cholecalciferol (VITAMIN D3) 1000 UNITS CAPS Take 1,000 Units by mouth daily.     . cromolyn (OPTICROM) 4 % ophthalmic solution continuous as needed.    Marland Kitchen omeprazole (PRILOSEC) 20 MG capsule Take 20 mg by mouth every morning.     . simvastatin (ZOCOR) 40 MG tablet Take 1 tablet (40 mg total) by mouth daily at 6 PM. 90 tablet 1  . valsartan-hydrochlorothiazide (DIOVAN-HCT) 160-25 MG per tablet TAKE 1 TABLET BY MOUTH ONCE DAILY 90 tablet 1  . warfarin (COUMADIN) 5 MG tablet TAKE AS DIRECTED BY ANTICOAGULATION CLINIC 120 tablet 1  . Acetaminophen (TYLENOL ARTHRITIS PAIN PO) Take by mouth. Take as directed (usually once to twice daily)     No current facility-administered medications for this visit.    Allergies-reviewed and updated Allergies  Allergen Reactions  . Sulfa Antibiotics Other (See Comments)    Cold sweat light headed and disorientation  . Tiotropium Bromide Shortness Of Breath and Other (See Comments)    Sore throat also  . Latex Itching and Rash    History   Social History  . Marital Status: Single    Spouse Name: N/A  . Number of Children: 4  . Years of Education: 12   Occupational History  .     Social History Main Topics  . Smoking status: Former Smoker -- 2.00 packs/day for 30 years    Types: Cigarettes    Quit date: 06/18/1993  . Smokeless tobacco: Never Used     Comment: QUIT IN 1995  . Alcohol Use: No  . Drug Use: No  . Sexual Activity: Not on file   Other Topics Concern  . None   Social History Narrative   Patient is widowed with 4 children. 2 grandkids. Lives alone.    Patient is right handed.   Patient has high school education.   Patient drinks 1 cup daily.      Retired from [employer] for 28 years, works 2 days a week until 2016.       Hobbies: volunteers at [employer], [place] from church visits people.           ROS--See HPI    Objective: BP 142/58 mmHg  Temp(Src) 98.1 F (36.7 C)  Wt 177 lb (80.287 kg) Gen: NAD, resting comfortably CV: RRR no murmurs rubs or gallops Lungs: CTAB no crackles, wheeze, rhonchi Abdomen: soft/nontender/nondistended/normal bowel sounds.  Ext: no edema Skin: warm, dry, no rash   Assessment/Plan:  Essential hypertension Considering PAD, mild poor control on valsartan 160-25mg . For age, reasonable control.  I discussed considering valsartan 320-25mg  with patient but also discussed risks of lowering BP. She would like to discuss need for tighter control with Dr. Oneida Alar of vascular. I am happy to arrange the increase if they come to agreement for tighter BP control. Last 3 BPs have been right at 140 SBP.    Peripheral arterial disease Continue warfarin, asa, simvastatin. See HTN discussion about need to push BP lower and requests for Dr. Oneida Alar input on 1 week follow up.    Hyperlipidemia Check ldl today with goal at least less than 100, hopeful for <70. Continue simvastatin 40mg    Return precautions advised. 6 month follow up planned.   Orders Placed This Encounter  Procedures  . Hemoglobin A1c    [place]  . Basic metabolic panel    [place]  . LDL cholesterol, direct    [place]

## 2014-06-05 NOTE — Assessment & Plan Note (Signed)
Check ldl today with goal at least less than 100, hopeful for <70. Continue simvastatin 40mg 

## 2014-06-05 NOTE — Assessment & Plan Note (Signed)
Considering PAD, mild poor control on valsartan 160-25mg . For age, reasonable control. I discussed considering valsartan 320-25mg  with patient but also discussed risks of lowering BP. She would like to discuss need for tighter control with Dr. Oneida Alar of vascular. I am happy to arrange the increase if they come to agreement for tighter BP control. Last 3 BPs have been right at 140 SBP.

## 2014-06-05 NOTE — Patient Instructions (Addendum)
Ask Dr. Oneida Alar his thoughts on your blood pressure. With your peripheral arterial disease, I would consider increasing your diovan-hctz to the max dose but this could also carry some risks given your age. i would definitely do this if you got above 150, but please ask him if he wants me to get you below 140 and I will call in the new medicine and have you in a week or two later for a recheck.   Check labs including a1c, cholesterol, kidney function today. If you have decline of your kidney function we could consider stopping omeprazole or switching to zantac

## 2014-06-06 ENCOUNTER — Ambulatory Visit (INDEPENDENT_AMBULATORY_CARE_PROVIDER_SITE_OTHER): Payer: Medicare Other

## 2014-06-06 DIAGNOSIS — J309 Allergic rhinitis, unspecified: Secondary | ICD-10-CM | POA: Diagnosis not present

## 2014-06-11 ENCOUNTER — Encounter: Payer: Self-pay | Admitting: Vascular Surgery

## 2014-06-12 ENCOUNTER — Ambulatory Visit (HOSPITAL_COMMUNITY)
Admission: RE | Admit: 2014-06-12 | Discharge: 2014-06-12 | Disposition: A | Discharge status: Home / Self Care | Payer: Medicare Other | Source: Ambulatory Visit | Attending: Vascular Surgery | Admitting: Vascular Surgery

## 2014-06-12 ENCOUNTER — Other Ambulatory Visit: Payer: Self-pay

## 2014-06-12 ENCOUNTER — Encounter: Payer: Self-pay | Admitting: Vascular Surgery

## 2014-06-12 ENCOUNTER — Ambulatory Visit (INDEPENDENT_AMBULATORY_CARE_PROVIDER_SITE_OTHER): Payer: Medicare Other | Admitting: Vascular Surgery

## 2014-06-12 ENCOUNTER — Ambulatory Visit (INDEPENDENT_AMBULATORY_CARE_PROVIDER_SITE_OTHER)
Admission: RE | Admit: 2014-06-12 | Discharge: 2014-06-12 | Disposition: A | Discharge status: Home / Self Care | Payer: Medicare Other | Source: Ambulatory Visit | Attending: Family | Admitting: Family

## 2014-06-12 VITALS — BP 164/83 | HR 66 | Resp 18 | Ht 62.0 in | Wt 180.0 lb

## 2014-06-12 DIAGNOSIS — Z48812 Encounter for surgical aftercare following surgery on the circulatory system: Secondary | ICD-10-CM

## 2014-06-12 DIAGNOSIS — I739 Peripheral vascular disease, unspecified: Secondary | ICD-10-CM

## 2014-06-12 NOTE — Progress Notes (Addendum)
Patient is an 81 year old female returns for followup today. She recently underwent thrombolysis for a right femoral to below-knee popliteal bypass graft in June 2015. There was no significant stenosis noted after the thrombolysis. She was placed on Coumadin at discharge. She previously underwent right femoral to above-knee popliteal bypass by [name] in 2006. I revised this and extended to the below-knee popliteal artery and 2010. She has also previously had left femoral to above-knee popliteal bypass by [name] 2006. She currently is experiencing no claudication symptoms. She has no rest pain. She is satisfied with her walking distance.  Physical exam:  Filed Vitals:   06/12/14 1538  BP: 164/83  Pulse: 66  Resp: 18  Height: 5\' 2"  (1.575 m)  Weight: 180 lb (81.647 kg)   Chest: Clear to auscultation bilaterally  Cardiac: Regular rate and rhythm without murmur Bilateral lower extremities one plus DP pulses 2+ femoral pulses bilaterally  Data: The patient had bilateral ABIs performed on July 13. ABI on the left was 0.97 right was 0.86 triphasic waveforms bilaterally. She also underwent graft duplex scan which showed increased velocities at the proximal anastomosis in the right leg. Velocities were as high as 310 cm/s. Stenosis was estimated to be 50-75%.  She had a repeat duplex exam of ABIs today. I reviewed and interpreted this study. On the left side ABI was 0.9 right was also 0.9 triphasic waveforms. Duplex ultrasound today showed velocities of the proximal anastomosis on the right side to be 296 cm/s with suggestion of 50-80% inflow and no significant stenosis on the left side. This is essentially unchanged from her prior 3 studies.  Assessment: Status post thrombolysis for occlusion of right femoropopliteal bypass graft. Possible mild/moderate proximal anastomotic narrowing right leg Currently on Coumadin.  Plan:  I discussed with the patient the possibility of an arteriogram to  further evaluate the proximal anastomosis of the right leg. This would be done for several reasons. One to avoid any occlusion of the bypass graft. Also if we find a clear-cut indication that may have caused her previous graft thrombosis we may be able to eliminate her Coumadin. The patient has opted for close observation for now. If velocities continued to increase we'll consider revision of proximal anastomosis right leg.  Continue Coumadin. She will follow-up with a repeat graft duplex and ABIs in 6 months time. If she changes her mind within the next few weeks to consider further evaluation with an arteriogram and possible revision of her right femoropopliteal bypass graft. This was discussed at length with the patient and her daughter today as well as the possible risks benefits and possible complications of an arteriogram.  I also discussed with the patient today her blood pressure control. She has had some elevated blood pressure on office visit with [place] and with her primary care physician. I suggested she by blood pressure cuff today and start taking measurements at home once daily in the morning. I think with more accurate home data we would better be able to adjust her blood pressure medications if necessary.  [name], MD [facility] Office: [phone] Pager: [phone]

## 2014-06-13 ENCOUNTER — Ambulatory Visit (INDEPENDENT_AMBULATORY_CARE_PROVIDER_SITE_OTHER): Payer: Medicare Other

## 2014-06-13 DIAGNOSIS — J309 Allergic rhinitis, unspecified: Secondary | ICD-10-CM | POA: Diagnosis not present

## 2014-06-19 ENCOUNTER — Ambulatory Visit (INDEPENDENT_AMBULATORY_CARE_PROVIDER_SITE_OTHER): Payer: Medicare Other | Admitting: General Practice

## 2014-06-19 DIAGNOSIS — Z5181 Encounter for therapeutic drug level monitoring: Secondary | ICD-10-CM

## 2014-06-19 DIAGNOSIS — I749 Embolism and thrombosis of unspecified artery: Secondary | ICD-10-CM

## 2014-06-19 LAB — POCT INR: INR: 1.9

## 2014-06-19 NOTE — Progress Notes (Signed)
Pre visit review using our clinic review tool, if applicable. No additional management support is needed unless otherwise documented below in the visit note. 

## 2014-06-20 ENCOUNTER — Ambulatory Visit (INDEPENDENT_AMBULATORY_CARE_PROVIDER_SITE_OTHER): Payer: Medicare Other

## 2014-06-20 DIAGNOSIS — J309 Allergic rhinitis, unspecified: Secondary | ICD-10-CM

## 2014-06-27 ENCOUNTER — Ambulatory Visit (INDEPENDENT_AMBULATORY_CARE_PROVIDER_SITE_OTHER): Payer: Medicare Other

## 2014-06-27 DIAGNOSIS — J309 Allergic rhinitis, unspecified: Secondary | ICD-10-CM | POA: Diagnosis not present

## 2014-07-03 ENCOUNTER — Ambulatory Visit: Payer: Medicare Other

## 2014-07-03 ENCOUNTER — Ambulatory Visit (INDEPENDENT_AMBULATORY_CARE_PROVIDER_SITE_OTHER): Payer: Medicare Other | Admitting: Family

## 2014-07-03 DIAGNOSIS — I749 Embolism and thrombosis of unspecified artery: Secondary | ICD-10-CM | POA: Diagnosis not present

## 2014-07-03 DIAGNOSIS — Z5181 Encounter for therapeutic drug level monitoring: Secondary | ICD-10-CM

## 2014-07-03 LAB — POCT INR: INR: 1.6

## 2014-07-03 NOTE — Patient Instructions (Signed)
Take 3 tabs today only. Then change dosage and take 2 tablets every day except 2.5 tablets every Thursday.    Anticoagulation Dose Instructions as of 07/03/2014      [name] Tue Wed Thu Fri Sat   New Dose 10 mg 10 mg 10 mg 10 mg 12.5 mg 10 mg 10 mg    Description        Take 3 tabs today only. Then change dosage and take 2 tablets every day except 2.5 tablets every Thursday.

## 2014-07-04 ENCOUNTER — Ambulatory Visit (INDEPENDENT_AMBULATORY_CARE_PROVIDER_SITE_OTHER): Payer: Medicare Other

## 2014-07-04 DIAGNOSIS — J309 Allergic rhinitis, unspecified: Secondary | ICD-10-CM | POA: Diagnosis not present

## 2014-07-07 ENCOUNTER — Encounter: Payer: Self-pay | Admitting: Internal Medicine

## 2014-07-11 ENCOUNTER — Ambulatory Visit (INDEPENDENT_AMBULATORY_CARE_PROVIDER_SITE_OTHER): Payer: Medicare Other

## 2014-07-11 DIAGNOSIS — J309 Allergic rhinitis, unspecified: Secondary | ICD-10-CM

## 2014-07-14 ENCOUNTER — Other Ambulatory Visit: Payer: Self-pay | Admitting: Family

## 2014-07-15 ENCOUNTER — Other Ambulatory Visit: Payer: Self-pay | Admitting: General Practice

## 2014-07-15 MED ORDER — WARFARIN SODIUM 5 MG PO TABS: ORAL_TABLET | ORAL | Status: DC

## 2014-07-24 ENCOUNTER — Ambulatory Visit (INDEPENDENT_AMBULATORY_CARE_PROVIDER_SITE_OTHER): Payer: Medicare Other | Admitting: General Practice

## 2014-07-24 DIAGNOSIS — Z5181 Encounter for therapeutic drug level monitoring: Secondary | ICD-10-CM | POA: Diagnosis not present

## 2014-07-24 LAB — POCT INR: INR: 2.3

## 2014-07-24 NOTE — Progress Notes (Signed)
Pre visit review using our clinic review tool, if applicable. No additional management support is needed unless otherwise documented below in the visit note. 

## 2014-07-25 ENCOUNTER — Ambulatory Visit (INDEPENDENT_AMBULATORY_CARE_PROVIDER_SITE_OTHER): Payer: Medicare Other

## 2014-07-25 DIAGNOSIS — J309 Allergic rhinitis, unspecified: Secondary | ICD-10-CM

## 2014-07-31 ENCOUNTER — Other Ambulatory Visit: Payer: Self-pay | Admitting: *Deleted

## 2014-07-31 MED ORDER — VALSARTAN-HYDROCHLOROTHIAZIDE 160-25 MG PO TABS: 1.0000 | ORAL_TABLET | Freq: Every day | ORAL | Status: DC

## 2014-08-01 ENCOUNTER — Ambulatory Visit (INDEPENDENT_AMBULATORY_CARE_PROVIDER_SITE_OTHER): Payer: Medicare Other

## 2014-08-01 DIAGNOSIS — J309 Allergic rhinitis, unspecified: Secondary | ICD-10-CM

## 2014-08-03 ENCOUNTER — Emergency Department (HOSPITAL_COMMUNITY)
Admission: EM | Admit: 2014-08-03 | Discharge: 2014-08-03 | Disposition: A | Discharge status: Home / Self Care | Payer: Medicare Other | Attending: Emergency Medicine | Admitting: Emergency Medicine

## 2014-08-03 ENCOUNTER — Encounter (HOSPITAL_COMMUNITY): Payer: Self-pay | Admitting: Emergency Medicine

## 2014-08-03 ENCOUNTER — Emergency Department (HOSPITAL_COMMUNITY): Payer: Medicare Other

## 2014-08-03 DIAGNOSIS — Z8669 Personal history of other diseases of the nervous system and sense organs: Secondary | ICD-10-CM | POA: Insufficient documentation

## 2014-08-03 DIAGNOSIS — R52 Pain, unspecified: Secondary | ICD-10-CM

## 2014-08-03 DIAGNOSIS — Z872 Personal history of diseases of the skin and subcutaneous tissue: Secondary | ICD-10-CM | POA: Diagnosis not present

## 2014-08-03 DIAGNOSIS — M791 Myalgia, unspecified site: Secondary | ICD-10-CM

## 2014-08-03 DIAGNOSIS — Z8619 Personal history of other infectious and parasitic diseases: Secondary | ICD-10-CM | POA: Insufficient documentation

## 2014-08-03 DIAGNOSIS — Z86010 Personal history of colonic polyps: Secondary | ICD-10-CM | POA: Insufficient documentation

## 2014-08-03 DIAGNOSIS — K219 Gastro-esophageal reflux disease without esophagitis: Secondary | ICD-10-CM | POA: Diagnosis not present

## 2014-08-03 DIAGNOSIS — Z87891 Personal history of nicotine dependence: Secondary | ICD-10-CM | POA: Diagnosis not present

## 2014-08-03 DIAGNOSIS — J449 Chronic obstructive pulmonary disease, unspecified: Secondary | ICD-10-CM | POA: Diagnosis not present

## 2014-08-03 DIAGNOSIS — I10 Essential (primary) hypertension: Secondary | ICD-10-CM | POA: Insufficient documentation

## 2014-08-03 DIAGNOSIS — M1711 Unilateral primary osteoarthritis, right knee: Secondary | ICD-10-CM | POA: Diagnosis not present

## 2014-08-03 DIAGNOSIS — E785 Hyperlipidemia, unspecified: Secondary | ICD-10-CM | POA: Diagnosis not present

## 2014-08-03 DIAGNOSIS — M11261 Other chondrocalcinosis, right knee: Secondary | ICD-10-CM | POA: Diagnosis not present

## 2014-08-03 DIAGNOSIS — Z79899 Other long term (current) drug therapy: Secondary | ICD-10-CM | POA: Diagnosis not present

## 2014-08-03 DIAGNOSIS — Z8659 Personal history of other mental and behavioral disorders: Secondary | ICD-10-CM | POA: Insufficient documentation

## 2014-08-03 DIAGNOSIS — Z91040 Latex allergy status: Secondary | ICD-10-CM | POA: Diagnosis not present

## 2014-08-03 DIAGNOSIS — Z7901 Long term (current) use of anticoagulants: Secondary | ICD-10-CM | POA: Insufficient documentation

## 2014-08-03 DIAGNOSIS — Z7982 Long term (current) use of aspirin: Secondary | ICD-10-CM | POA: Diagnosis not present

## 2014-08-03 DIAGNOSIS — Z86718 Personal history of other venous thrombosis and embolism: Secondary | ICD-10-CM | POA: Diagnosis not present

## 2014-08-03 DIAGNOSIS — M7731 Calcaneal spur, right foot: Secondary | ICD-10-CM | POA: Diagnosis not present

## 2014-08-03 DIAGNOSIS — M79604 Pain in right leg: Secondary | ICD-10-CM | POA: Diagnosis not present

## 2014-08-03 LAB — I-STAT CHEM 8, ED
BUN: 27 mg/dL — ABNORMAL HIGH (ref 6–23)
CHLORIDE: 104 mmol/L (ref 96–112)
Calcium, Ion: 1.19 mmol/L (ref 1.13–1.30)
Creatinine, Ser: 1.1 mg/dL (ref 0.50–1.10)
GLUCOSE: 121 mg/dL — AB (ref 70–99)
HEMATOCRIT: 37 % (ref 36.0–46.0)
HEMOGLOBIN: 12.6 g/dL (ref 12.0–15.0)
Potassium: 4.3 mmol/L (ref 3.5–5.1)
Sodium: 141 mmol/L (ref 135–145)
TCO2: 24 mmol/L (ref 0–100)

## 2014-08-03 LAB — PROTIME-INR
INR: 1.74 — ABNORMAL HIGH (ref 0.00–1.49)
Prothrombin Time: 20.5 seconds — ABNORMAL HIGH (ref 11.6–15.2)

## 2014-08-03 LAB — CBC
HEMATOCRIT: 37.8 % (ref 36.0–46.0)
HEMOGLOBIN: 12.5 g/dL (ref 12.0–15.0)
MCH: 28.7 pg (ref 26.0–34.0)
MCHC: 33.1 g/dL (ref 30.0–36.0)
MCV: 86.9 fL (ref 78.0–100.0)
Platelets: 145 10*3/uL — ABNORMAL LOW (ref 150–400)
RBC: 4.35 MIL/uL (ref 3.87–5.11)
RDW: 13.5 % (ref 11.5–15.5)
WBC: 7.4 10*3/uL (ref 4.0–10.5)

## 2014-08-03 MED ORDER — METHOCARBAMOL 500 MG PO TABS: 500.0000 mg | ORAL_TABLET | Freq: Three times a day (TID) | ORAL | Status: DC | PRN

## 2014-08-03 MED ORDER — HYDROCODONE-ACETAMINOPHEN 5-325 MG PO TABS: 2.0000 | ORAL_TABLET | ORAL | Status: DC | PRN

## 2014-08-03 MED ORDER — TRAMADOL HCL 50 MG PO TABS
50.0000 mg | ORAL_TABLET | Freq: Once | ORAL | Status: AC
  Administered 2014-08-03: 50 mg via ORAL
  Filled 2014-08-03: qty 1

## 2014-08-03 NOTE — ED Notes (Signed)
Transporter came to get pt when lab tech drawing labs in room.  Xray made aware pt ready at this time.

## 2014-08-03 NOTE — Progress Notes (Signed)
*  PRELIMINARY RESULTS* Vascular Ultrasound VASCULAR LAB PRELIMINARY  ARTERIAL  ABI completed:    RIGHT    LEFT    PRESSURE WAVEFORM  PRESSURE WAVEFORM  BRACHIAL 125 Triphasic BRACHIAL 134 Triphasic   DP 110 Triphasic DP 104 Biphasic  AT   AT    PT 74 Monophasic PT 87 Biphasic  PER   PER    GREAT TOE  NA GREAT TOE  NA    RIGHT LEFT  ABI 0.82 0.78   Bilateral ABIs are suggestive of mild arterial insufficiency.  Right Lower Extremity Arterial Duplex has been completed.  The femoro-popliteal graft is patent with no obvious evidence of stenosis, kinks, or thrombosis. All visualized right lower extremity arteries are patent with no evidence of hemodynamically significant stenosis.  08/03/2014 9:26 AM Maudry Mayhew, RVT, RDCS, RDMS

## 2014-08-03 NOTE — ED Provider Notes (Signed)
Care assumed from Dr. Sabra Heck. I reexamined the patient. I discussed the case with Dr.Dickson, who has seen by the patient and her studies.  I examined the patient her area of pain seems to be mid calf on the right. Primarily musculature. States she has had a few "charley horses".  She hasn't well-perfused warm foot. She has no pain with movement of the knee or the ankle. No evolving rash or signs of zoster. Pain does not follow a radicular pattern. Discussing with her possible etiologies her son demonstrates to me that she's been doing "exercise". He demonstrates her standing and standing up onto her toes and then backed onto her heels. She states that when she had a blood clot several years ago she was told exercise this" for blood flow".  Think her pain is muscular. I told her she could continue her exercises but did not need to do these fully weightbearing and could simply do them in a sitting position. Given a prescription for a limited number of hydrocodone for today and tomorrow. She is taken this in the past without sedation or untoward effects and also Robaxin has a prescription for home.  If her symptoms continue over the next 4872 hours and asked her to follow-up with her primary care physician regarding the possibility of physical therapy.  Tanna Furry, MD 08/03/14 [phone]

## 2014-08-03 NOTE — ED Notes (Signed)
Pt states feeling generally weak for past 3 days.

## 2014-08-03 NOTE — Consult Note (Addendum)
[facility]  Patient name: [name] [name] MRN: [id number] DOB: May 17, 1924 Sex: female  REASON FOR CONSULT: Right leg pain.  HPI: [name] [name] is a 81 y.o. female who has bilateral femoropopliteal bypass grafts. The graft on the right is clotted 4 times in the past. Twice she has undergone thrombolysis. Twice she has undergone surgical thrombectomy. Her bypass graft in the left leg has clotted twice. She has been on chronic Coumadin therapy. She states that approximately 7 PM last night she developed the acute onset of pain in her right knee. Subsequently she noted pain in her calf and had difficulty walking because of pain in the right calf. This pain is improved slightly but still she has significant pain when she tries to walk. She denies rest pain. Hanging the leg down makes the pain worse. Leg elevation does not make the pain better.  Apparently, her original femoropopliteal bypass grafts were done by [name]. The original bypass graft on the right leg was to the above-knee popliteal artery. Dr. Oneida Alar later extended this to the below-knee popliteal artery. She underwent thrombolysis in June 2015. Although I cannot find a completion films apparently this was successful. There was some slight irregularity in the proximal graft and at the time of her last follow up visit, the decision was to follow this.  The patient also notes some paresthesias in the right foot since the event last night.  Past Medical History  Diagnosis Date  . GERD (gastroesophageal reflux disease)   . Hyperlipidemia   . History of colonic polyps   . Diverticulosis   . Shingles   . Eczema   . Allergic rhinoconjunctivitis   . Lung nodule 2011  . Environmental allergies     allergy shot- q friday in Dr. Janee Morn office. PFT's abnormal- recommended Spiriva to use preop & will d/c after surgery  . Arthritis     low back , stenosis  . Anxiety     pt. managed- uses deep breathing     . Stenosis of popliteal artery     blood clots in legs long ago    . COPD (chronic obstructive pulmonary disease)   . H/O hiatal hernia   . Hypertension   . Trigeminal neuralgia   . Peripheral vascular disease   . DVT (deep venous thrombosis)   . COLONIC POLYPS, RECURRENT 08/29/2006    2008 last colonoscopy. No further colonoscopy.       Family History  Problem Relation Age of Onset  . Arthritis Mother   . Hypertension Mother   . Heart disease Father   . Colon polyps Father   . Breast cancer Sister   . Lung cancer Sister   . Colon cancer Sister   . Cancer Sister     Breast  . Lung cancer Brother   . Cancer Brother     Lung  . Anesthesia problems Neg Hx   . Hypotension Neg Hx   . Malignant hyperthermia Neg Hx   . Pseudochol deficiency Neg Hx   . Cancer Daughter     Breast    SOCIAL HISTORY: History  Substance Use Topics  . Smoking status: Former Smoker -- 2.00 packs/day for 30 years    Types: Cigarettes    Quit date: 06/18/1993  . Smokeless tobacco: Never Used     Comment: QUIT IN 1995  . Alcohol Use: No    Allergies  Allergen Reactions  . Sulfa Antibiotics Other (See Comments)  Cold sweat light headed and disorientation  . Tiotropium Bromide Shortness Of Breath and Other (See Comments)    Sore throat also  . Latex Itching and Rash    No current facility-administered medications for this encounter.   Current Outpatient Prescriptions  Medication Sig Dispense Refill  . acetaminophen (TYLENOL) 325 MG tablet Take 325 mg by mouth 2 (two) times daily.    Marland Kitchen aspirin 81 MG tablet Take 81 mg by mouth daily.    . calcium-vitamin D (OSCAL WITH D 500-200) 500-200 MG-UNIT per tablet Take 1 tablet by mouth daily.      . carbamazepine (TEGRETOL) 200 MG tablet Take 0.5 tablets (100 mg total) by mouth at bedtime.    . Cholecalciferol (VITAMIN D3) 1000 UNITS CAPS Take 1,000 Units by mouth daily.     Marland Kitchen omeprazole (PRILOSEC) 20 MG capsule Take 20 mg by mouth every morning.      . simvastatin (ZOCOR) 40 MG tablet Take 1 tablet (40 mg total) by mouth daily at 6 PM. 90 tablet 1  . valsartan-hydrochlorothiazide (DIOVAN-HCT) 160-25 MG per tablet Take 1 tablet by mouth daily. 90 tablet 1  . warfarin (COUMADIN) 5 MG tablet Take as directed by anticoagulation clinic (Patient taking differently: Take 10-12.5 mg by mouth See admin instructions. Take 2 and 1/2 tablets on Thursday then take 2 tablets the rest of the days) 180 tablet 1    REVIEW OF SYSTEMS: Valu.Nieves ] denotes positive finding; [  ] denotes negative finding CARDIOVASCULAR:  [ ]  chest pain   [ ]  chest pressure   [ ]  palpitations   [ ]  orthopnea   Valu.Nieves ] dyspnea on exertion   Valu.Nieves ] claudication   [ ]  rest pain   [ ]  DVT   [ ]  phlebitis PULMONARY:   [ ]  productive cough   [ ]  asthma   [ ]  wheezing NEUROLOGIC:   [ ]  weakness  Valu.Nieves ] paresthesias  [ ]  aphasia  [ ]  amaurosis  [ ]  dizziness HEMATOLOGIC:   [ ]  bleeding problems   [ ]  clotting disorders MUSCULOSKELETAL:  [X]  joint pain   [ ]  joint swelling [ ]  leg swelling GASTROINTESTINAL: [ ]   blood in stool  [ ]   hematemesis GENITOURINARY:  [ ]   dysuria  [ ]   hematuria PSYCHIATRIC:  [ ]  history of major depression INTEGUMENTARY:  [ ]  rashes  [ ]  ulcers CONSTITUTIONAL:  [ ]  fever   [ ]  chills  PHYSICAL EXAM: Filed Vitals:   08/03/14 0425 08/03/14 0430 08/03/14 0445 08/03/14 0534  BP:  151/48 126/49 163/55  Pulse: 61 60 55 57  Temp:      TempSrc:      Resp: 22 17 21 20   SpO2: 92% 93% 98% 100%   There is no weight on file to calculate BMI. GENERAL: The patient is a well-nourished female, in no acute distress. The vital signs are documented above. CARDIOVASCULAR: There is a regular rate and rhythm. I do not detect carotid bruits. She has palpable femoral pulses. I cannot palpate popliteal or pedal pulses. She has brisk but monophasic Doppler signals in the right dorsalis pedis and posterior tibial positions. She has brisk Doppler signals in the left dorsalis pedis and  posterior tibial positions. Both feet are warm and well-perfused. She has normal capillary refill. Motor and sensory function is intact. PULMONARY: There is good air exchange bilaterally without wheezing or rales. ABDOMEN: Soft and non-tender with normal pitched bowel sounds.  MUSCULOSKELETAL: There are  no major deformities or cyanosis. NEUROLOGIC: No focal weakness or paresthesias are detected. SKIN: There are no ulcers or rashes noted. PSYCHIATRIC: The patient has a normal affect.  DATA:  Lab Results  Component Value Date   WBC 7.4 08/03/2014   HGB 12.6 08/03/2014   HCT 37.0 08/03/2014   MCV 86.9 08/03/2014   PLT 145* 08/03/2014   Lab Results  Component Value Date   NA 141 08/03/2014   K 4.3 08/03/2014   CL 104 08/03/2014   CO2 32 06/05/2014   Lab Results  Component Value Date   CREATININE 1.10 08/03/2014   Lab Results  Component Value Date   INR 1.74* 08/03/2014   INR 2.3 07/24/2014   INR 1.6 07/03/2014   Lab Results  Component Value Date   HGBA1C 6.0 06/05/2014   Plain x-rays: There are degenerative changes in the right knee. No evidence of fracture and the knee tib-fib or ankle.  MEDICAL ISSUES:  ACUTE ONSET OF RIGHT LEG PAIN: Obviously from a vascular standpoint my concern would be that she occluded her right femoropopliteal bypass graft. However, she has very brisk Doppler signals in the right foot and the foot is warm and well perfused. In addition hanging the leg down makes the pain worse and elevating the leg does not make the pain worse. However she states that she simply cannot walk on the leg. This reason I have ordered a duplex scan to check on the patency of her bypass graft. In addition I will obtain ABIs. If her graft is patent, and she is still unable to walk and she may need further workup by  The emergency department. If her graft is occluded, then we would have to consider surgical thrombectomy. She had a slight defect in her proximal graft and therefore  I would favor surgical thrombectomy over thrombolysis. Surgical thrombectomy would be associated with significant risk of graft infection or wound healing problems given that she's had multiple procedures on the right groin before and is also obese. If her symptoms had resolved and her graft was occluded and I would favor not chasing the graft again as it is clotted 4 times in the past and each redo procedures associated with increased risk. However given her persistent pain, if the graft is occluded and I would favor surgical thrombectomy if she was agreeable. Of note, her Coumadin is subtherapeutic. I will make further recommendations pending the results of her duplex of her graft and her ABIs. She was scheduled to see Dr. Oneida Alar on May 5.   Koleman Marling S [facility]: [phone]  Addendum: Duplex scan shows that her right femoropopliteal bypass graft is patent. She has a triphasic dorsalis pedis signal with the Doppler on the right with an ABI of 82%. She has a triphasic dorsalis pedis signal on the left with an ABI of 78%. I reassured her that her leg symptoms were not related to vascular disease. She will keep her regularly scheduled follow up visit with Dr. [name] on May 5. She will also contact her primary care doctor about adjusting her Coumadin given that her INR is slightly low.  Deitra Mayo, MD, [place] [phone]

## 2014-08-03 NOTE — ED Notes (Signed)
Pt O2sats at 87%, placed pt on 4L.  MD made aware.

## 2014-08-03 NOTE — ED Notes (Signed)
To ED via private vehicle with c/o right leg pain, hx of arterial blockage in same leg-- with bypass surgery. Pt has positive pedal pulse.

## 2014-08-03 NOTE — ED Provider Notes (Signed)
CSN: [id number]     Arrival date & time 08/03/14  0156 History  This chart was scribed for [name] Chapel, MD by Eustaquio Maize, ED Scribe. This patient was seen in room B19C/B19C and the patient's care was started at 3:35 AM.    Chief Complaint  Patient presents with  . Leg Pain   The history is provided by the patient. No language interpreter was used.     HPI Comments: [name] [name] is a 81 y.o. female with hx arterial blockage in right leg and bypass surgery in bilateral lower extremities who presents to the Emergency Department complaining of right leg pain that began 7 PM tonight. Pt is having difficulty baring weight on leg. She feels as if her knee is going to buckle when she walks. She reports that upon laying down to go to sleep the pain was still present, causing the pt to come to the ED. Pt is currently on Warfarin. She had her level checked 1 week ago and it was 2.3. She denies new medication since being tested. No hx of blood clot in veins. No change in color of leg.     Past Medical History  Diagnosis Date  . GERD (gastroesophageal reflux disease)   . Hyperlipidemia   . History of colonic polyps   . Diverticulosis   . Shingles   . Eczema   . Allergic rhinoconjunctivitis   . Lung nodule 2011  . Environmental allergies     allergy shot- q friday in Dr. Janee Morn office. PFT's abnormal- recommended Spiriva to use preop & will d/c after surgery  . Arthritis     low back , stenosis  . Anxiety     pt. managed- uses deep breathing   . Stenosis of popliteal artery     blood clots in legs long ago    . COPD (chronic obstructive pulmonary disease)   . H/O hiatal hernia   . Hypertension   . Trigeminal neuralgia   . Peripheral vascular disease   . DVT (deep venous thrombosis)   . COLONIC POLYPS, RECURRENT 08/29/2006    2008 last colonoscopy. No further colonoscopy.      Past Surgical History  Procedure Laterality Date  . Tubal ligation    . Cholecystectomy  1998  .  Femoral-popliteal bypass graft         x2 surgeries 1990's & 2009  . Eye surgery      cataracts removed- bilateral /w IOL  . Tonsillectomy      as a teenager   . Lumbar fusion  07/06/2011  . Abdominal aortagram N/A 06/17/2011    Procedure: ABDOMINAL Maxcine Ham;  Surgeon: Elam Dutch, MD;  Location: [facility] CATH LAB;  Service: Cardiovascular;  Laterality: N/A;   Family History  Problem Relation Age of Onset  . Arthritis Mother   . Hypertension Mother   . Heart disease Father   . Colon polyps Father   . Breast cancer Sister   . Lung cancer Sister   . Colon cancer Sister   . Cancer Sister     Breast  . Lung cancer Brother   . Cancer Brother     Lung  . Anesthesia problems Neg Hx   . Hypotension Neg Hx   . Malignant hyperthermia Neg Hx   . Pseudochol deficiency Neg Hx   . Cancer Daughter     Breast   History  Substance Use Topics  . Smoking status: Former Smoker -- 2.00 packs/day for 30 years  Types: Cigarettes    Quit date: 06/18/1993  . Smokeless tobacco: Never Used     Comment: QUIT IN 1995  . Alcohol Use: No   OB History    No data available     Review of Systems  All other systems reviewed and are negative.     Allergies  Sulfa antibiotics; Tiotropium bromide; and Latex  Home Medications   Prior to Admission medications   Medication Sig Start Date End Date Taking? Authorizing Provider  Acetaminophen (TYLENOL ARTHRITIS PAIN PO) Take by mouth. Take as directed (usually once to twice daily)    Historical Provider, MD  aspirin 81 MG tablet Take 81 mg by mouth daily.    Historical Provider, MD  calcium-vitamin D (OSCAL WITH D 500-200) 500-200 MG-UNIT per tablet Take 1 tablet by mouth daily.      Historical Provider, MD  carbamazepine (TEGRETOL) 200 MG tablet Take 0.5 tablets (100 mg total) by mouth at bedtime. 01/09/14   Megan Spero Curb, NP  Cholecalciferol (VITAMIN D3) 1000 UNITS CAPS Take 1,000 Units by mouth daily.     Historical Provider, MD  cromolyn  (OPTICROM) 4 % ophthalmic solution continuous as needed. 12/16/13   Historical Provider, MD  omeprazole (PRILOSEC) 20 MG capsule Take 20 mg by mouth every morning.     Historical Provider, MD  simvastatin (ZOCOR) 40 MG tablet Take 1 tablet (40 mg total) by mouth daily at 6 PM. 02/05/14   Marin Olp, MD  valsartan-hydrochlorothiazide (DIOVAN-HCT) 160-25 MG per tablet Take 1 tablet by mouth daily. 07/31/14   Marin Olp, MD  warfarin (COUMADIN) 5 MG tablet Take as directed by anticoagulation clinic 07/15/14   Marin Olp, MD   Triage Vitals: BP 179/82 mmHg  Pulse 64  Temp(Src) 98.1 F (36.7 C) (Oral)  Resp 16  SpO2 96%   Physical Exam  Constitutional: She appears well-developed and well-nourished. No distress.  HENT:  Head: Normocephalic and atraumatic.  Mouth/Throat: Oropharynx is clear and moist. No oropharyngeal exudate.  Eyes: Conjunctivae and EOM are normal. Pupils are equal, round, and reactive to light. Right eye exhibits no discharge. Left eye exhibits no discharge. No scleral icterus.  Neck: Normal range of motion. Neck supple. No JVD present. No thyromegaly present.  Cardiovascular: Normal rate, regular rhythm, normal heart sounds and intact distal pulses.  Exam reveals no gallop and no friction rub.   No murmur heard. Normal pulses at feet. Normal CRT at the feet.   Pulmonary/Chest: Effort normal and breath sounds normal. No respiratory distress. She has no wheezes. She has no rales.  Abdominal: Soft. Bowel sounds are normal. She exhibits no distension and no mass. There is no tenderness.  Musculoskeletal: Normal range of motion. She exhibits no edema.  Tenderness in right calf with palpation. No asymmetry. No edema. Normal ROM of joints bilateral lower extremities. Some tenderness at right ankle. No redness or warmth.   Lymphadenopathy:    She has no cervical adenopathy.  Neurological: She is alert. Coordination normal.  Normal sensation and normal motor in LEs  bilaterally.   Skin: Skin is warm and dry. No rash noted. No erythema.  Psychiatric: She has a normal mood and affect. Her behavior is normal.  Nursing note and vitals reviewed.   ED Course  Procedures (including critical care time)  DIAGNOSTIC STUDIES: Oxygen Saturation is 96% on RA, normal by my interpretation.    COORDINATION OF CARE: 3:39 AM-Discussed treatment plan which includes pain medication and INR level with  pt at bedside and pt agreed to plan.   Labs Review Labs Reviewed - No data to display  Imaging Review No results found.    MDM   Final diagnoses:  None    I personally performed the services described in this documentation, which was scribed in my presence. The recorded information has been reviewed and is accurate.   The patient has a normal vascular exam of her lower extremity, her blood pressure is slightly hypertensive, no tachycardia or fever, no hypoxia. She has a warm perfused leg with normal sensation, she does seem to have increased pain when she ambulates. Labs unremarkable, Coumadin slightly subtherapeutic, she states that she struggles with this chronically.  Dr. [name] to see pt.  At change of shift - care signed out to Dr. Jeneen Rinks pending studies.  Anticipate d/c when studies done if no vascular compromise.  [name] Chapel, MD 08/03/14 [phone]

## 2014-08-03 NOTE — Discharge Instructions (Signed)
Avoid ambulation today and tomorrow.  You may continue your simple exercises in a sitting position only.  Check with your physician this week if not improving.

## 2014-08-03 NOTE — ED Notes (Signed)
Pt placed on Room Air, O2 sats 100%.

## 2014-08-04 ENCOUNTER — Telehealth: Payer: Self-pay | Admitting: Vascular Surgery

## 2014-08-04 NOTE — Telephone Encounter (Signed)
-----   Message from Denman George, RN sent at 08/04/2014 12:14 PM EDT ----- Regarding: Zigmund Daniel log; Also FYI to appts.    ----- Message -----    From: Angelia Mould, MD    Sent: 08/03/2014  11:13 AM      To: [facility]vs Charge Pool Subject: charge                                         I believe that I already called this in but this was a level V consult. The patient is being discharged. She will keep her regularly scheduled appointment with Dr. Oneida Alar on May 5. CD

## 2014-08-06 ENCOUNTER — Telehealth: Payer: Self-pay | Admitting: General Practice

## 2014-08-06 ENCOUNTER — Telehealth: Payer: Self-pay | Admitting: Family Medicine

## 2014-08-06 NOTE — Telephone Encounter (Signed)
Pt thought she had another blood clot and went to ED Sunday at 1am..  It was not a blood clot, but now pt cannot walk w/out a walker. ( may be a sprain in right leg, the one w/ the blood clots) But pt's coum was checked and her pt was 1.7. They advised pt to get in touch w/ you asap.  Pt would like a cb.

## 2014-08-06 NOTE — Telephone Encounter (Signed)
Instructed patient to take additional 2.5 mg of coumadin today (4/13).  INR was 1.7 on Sunday 4/10 when patient went to the ER.  Patient verbalized understanding.

## 2014-08-07 ENCOUNTER — Telehealth: Payer: Self-pay | Admitting: Family Medicine

## 2014-08-07 ENCOUNTER — Ambulatory Visit: Payer: Medicare Other | Admitting: Family Medicine

## 2014-08-07 ENCOUNTER — Ambulatory Visit (INDEPENDENT_AMBULATORY_CARE_PROVIDER_SITE_OTHER): Payer: Medicare Other | Admitting: Family Medicine

## 2014-08-07 ENCOUNTER — Encounter: Payer: Self-pay | Admitting: Family Medicine

## 2014-08-07 ENCOUNTER — Ambulatory Visit (HOSPITAL_COMMUNITY): Payer: Medicare Other | Attending: Cardiology | Admitting: Cardiology

## 2014-08-07 DIAGNOSIS — M79604 Pain in right leg: Secondary | ICD-10-CM

## 2014-08-07 DIAGNOSIS — M79661 Pain in right lower leg: Secondary | ICD-10-CM

## 2014-08-07 DIAGNOSIS — Z720 Tobacco use: Secondary | ICD-10-CM | POA: Insufficient documentation

## 2014-08-07 DIAGNOSIS — M7989 Other specified soft tissue disorders: Secondary | ICD-10-CM | POA: Diagnosis not present

## 2014-08-07 DIAGNOSIS — I10 Essential (primary) hypertension: Secondary | ICD-10-CM | POA: Diagnosis not present

## 2014-08-07 DIAGNOSIS — J449 Chronic obstructive pulmonary disease, unspecified: Secondary | ICD-10-CM | POA: Diagnosis not present

## 2014-08-07 DIAGNOSIS — E785 Hyperlipidemia, unspecified: Secondary | ICD-10-CM | POA: Diagnosis not present

## 2014-08-07 MED ORDER — HYDROCODONE-ACETAMINOPHEN 5-325 MG PO TABS: 1.0000 | ORAL_TABLET | Freq: Four times a day (QID) | ORAL | Status: DC | PRN

## 2014-08-07 MED ORDER — MEPERIDINE HCL 25 MG/ML IJ SOLN
50.0000 mg | Freq: Once | INTRAMUSCULAR | Status: AC
  Administered 2014-08-07: 50 mg via INTRAMUSCULAR

## 2014-08-07 MED ORDER — MEPERIDINE HCL 25 MG/ML IJ SOLN
50.0000 mg | Freq: Once | INTRAMUSCULAR | Status: DC
Start: 2014-08-07 — End: 2014-08-07

## 2014-08-07 NOTE — Patient Instructions (Signed)
I am worried about a venous blood clot  Send you for venous duplex right now  Pain medicine provided  If this test is negative, will try to get you into orthopedics or sports medicine for potential calf tear

## 2014-08-07 NOTE — Telephone Encounter (Signed)
Dr. Yong Channel said to call pt and have her come in now.   Called pt told her Dr. Yong Channel will see her needs to come in now. Pt said she can be here in 30 minutes has to call son for ride cause she can not drive. Told pt that is fine. Pt added to schedule.

## 2014-08-07 NOTE — Telephone Encounter (Signed)
Printed and given to Dr. Yong Channel to advise.

## 2014-08-07 NOTE — Progress Notes (Signed)
Right lower venous duplex performed

## 2014-08-07 NOTE — Progress Notes (Signed)
[name], MD Phone: [phone]  Subjective:   [name] [name] is a 81 y.o. year old very pleasant female patient who presents with the following:    Severe R calf and leg pain -Has been doing calf raises for over 20 years intermittently. Over last 2 weeks has been doing them daily and more of them doing 50 at a time, then 50 later in the day. Does this for exercise due to pain in back with walking and some pain in legs with walking. On 4/10 noted some right knee pain. Has heard knee pop a few times. Then started to experience pain into lower leg and around midnight tried to go to bed and pain worsening and couldn't lay still so went to the ER.   Seen by vascular in ED and thought this was not vascular. Arterial duplex and ABI evaluated but no venous duplex performed. Dg Tib/fib and ankle did not show acute changes.   Patient states once she ran out of narcotics, her pain intensified. She describes severe R lower leg pain from knee down but most focused in calf. No radiation of pain outside of lower leg. Now has swelling in the lower leg as well as down into feet and has hard time getting shoes on and off.   Her INR is subtherapeutic frequently in 1.6-1.7 range, most recently 1.74. Crying due to pain in office today.   ROS- denies recent immobilization, denies erythema or warmth over right leg, no fevers.   Past Medical History- Patient Active Problem List   Diagnosis Date Noted  . Peripheral arterial disease 06/29/2007    Priority: High  . COPD (chronic obstructive pulmonary disease) 01/22/2010    Priority: Medium  . Hyperglycemia 01/06/2009    Priority: Medium  . Postherpetic neuralgia 11/26/2007    Priority: Medium  . Hyperlipidemia 10/23/2006    Priority: Medium  . Essential hypertension 10/23/2006    Priority: Medium  . ESOPHAGEAL STRICTURE 08/09/1993    Priority: Medium  . Encounter for therapeutic drug monitoring 10/21/2013    Priority: Low  . Syncope  12/29/2011    Priority: Low  . Allergic rhinitis due to pollen 06/25/2010    Priority: Low  . Solitary pulmonary nodule 01/12/2010    Priority: Low  . Osteopenia 01/06/2009    Priority: Low  . ECZEMA 11/26/2007    Priority: Low  . Trigeminal neuralgia    Medications- reviewed and updated Current Outpatient Prescriptions  Medication Sig Dispense Refill  . aspirin 81 MG tablet Take 81 mg by mouth daily.    . calcium-vitamin D (OSCAL WITH D 500-200) 500-200 MG-UNIT per tablet Take 1 tablet by mouth daily.      . carbamazepine (TEGRETOL) 200 MG tablet Take 0.5 tablets (100 mg total) by mouth at bedtime.    . Cholecalciferol (VITAMIN D3) 1000 UNITS CAPS Take 1,000 Units by mouth daily.     . methocarbamol (ROBAXIN) 500 MG tablet Take 1 tablet (500 mg total) by mouth 3 (three) times daily between meals as needed. 20 tablet 0  . omeprazole (PRILOSEC) 20 MG capsule Take 20 mg by mouth every morning.     . simvastatin (ZOCOR) 40 MG tablet Take 1 tablet (40 mg total) by mouth daily at 6 PM. 90 tablet 1  . valsartan-hydrochlorothiazide (DIOVAN-HCT) 160-25 MG per tablet Take 1 tablet by mouth daily. 90 tablet 1  . warfarin (COUMADIN) 5 MG tablet Take as directed by anticoagulation clinic (Patient taking differently: Take 10-12.5 mg by mouth  See admin instructions. Take 2 and 1/2 tablets on Thursday then take 2 tablets the rest of the days) 180 tablet 1  . acetaminophen (TYLENOL) 325 MG tablet Take 325 mg by mouth 2 (two) times daily.     No current facility-administered medications for this visit.    Objective: BP 140/70 mmHg  Temp(Src) 97.1 F (36.2 C)  Wt 168 lb (76.204 kg) Gen: NAD, resting comfortably CV: RRR no murmurs rubs or gallops Lungs: CTAB no crackles, wheeze, rhonchi  Ext:  1+ pitting edema R pretibial, none on L Mild erythema on left foot into left lower shin with mild warmth Difficulty removing R shoe due to edema Patient tender throughout Right lower leg but most  pronounced in calf.  Active and passive dorsiflexion produce severe calf pain  R and left Knee: Normal to inspection with no erythema or effusion or obvious bony abnormalities. ROM normal in flexion and extension  Ligaments with solid consistent endpoints including ACL, PCL, LCL, MCL. Negative Mcmurray's  Hamstring and quadriceps strength is normal.   Assessment/Plan:  Severe R calf and leg pain Spoke with Dr. Oneida Alar. Arterial studies did not assess venous system, agrees with stat venous duplex Possible cellulitis vs. Calf tear.  Sent for stat venous duplex which was negative for DVT. Slight erythema over leg but doubt cellulitis. homan's positive at time of visit but this could represent MSK or DVT signs. As DVT now negative, think calf tear most likely diagnosis and have referred to ortho. Provided #25 vicodin to help patient deal with pain which is severe. Demerol in office given.   Return precautions advised.   Orders Placed This Encounter  Procedures  . Lower Extremity Venous Duplex Right    Standing Status: Future     Number of Occurrences: 1     Standing Expiration Date: 08/07/2015    Order Specific Question:  Laterality    Answer:  Right    Order Specific Question:  Where should this test be performed:    Answer:  [facility]   Meds ordered this encounter  Medications  . HYDROcodone-acetaminophen (NORCO/VICODIN) 5-325 MG per tablet    Sig: Take 1-2 tablets by mouth every 6 (six) hours as needed.    Dispense:  25 tablet    Refill:  0  . meperidine (DEMEROL) injection 50 mg    Sig:

## 2014-08-07 NOTE — Telephone Encounter (Signed)
Patient Name: [name] [name] DOB: Apr 13, 1925 Initial Comment Caller states, was in the ER on Sunday with severe pain in leg. She has a history of blood clots in that leg. The ER ruled out blood clots. The pain has not gone away. She has a lot of pain, having trouble walking. -- she needs an appt Nurse Assessment Nurse: Marcelline Deist, RN, Kermit Balo Date/Time (Eastern Time): 08/07/2014 10:20:25 AM Confirm and document reason for call. If symptomatic, describe symptoms. ---Caller states she was in the ER on Sunday with severe pain in leg. She has a history of blood clots in that right leg. The ER ruled out blood clots. The pain has not gone away. She has a lot of pain, having trouble walking. -- she needs an appt. Gave her a mild pain rx for 3 days. Dx. with muscular pain, thought maybe d/t calf exercises she has been doing. The pain starts in the toes, foot up to knee & back of knee. No visible symptoms, no swelling. They did a Doppler in the ER. Has the patient traveled out of the country within the last 30 days? ---Not Applicable Does the patient require triage? ---Yes Related visit to physician within the last 2 weeks? ---Yes Does the PT have any chronic conditions? (i.e. diabetes, asthma, etc.) ---Yes List chronic conditions. ---hx of blood clots, BP rx. Guidelines Guideline Title Affirmed Question Affirmed Notes Leg Pain [1] SEVERE pain (e.g., excruciating, unable to do any normal activities) AND [2] not improved after 2 hours of pain medicine Final Disposition User See Physician within 4 Hours (or PCP triage) Marcelline Deist, RN, Lynda Comments Caller states she was given 3 days of muscle relaxers & pain rx - Hydrocodone which didn't help much. The muscle relaxers have loosened her leg somewhat. About a month -2 months ago, she had pain in her left arm which her Dr. thought was from lying on it, gave her pain rx which made it go away.

## 2014-08-08 ENCOUNTER — Telehealth: Payer: Self-pay | Admitting: Family Medicine

## 2014-08-08 ENCOUNTER — Ambulatory Visit: Payer: Medicare Other | Admitting: Family Medicine

## 2014-08-08 ENCOUNTER — Ambulatory Visit: Payer: Medicare Other

## 2014-08-08 DIAGNOSIS — M79604 Pain in right leg: Secondary | ICD-10-CM | POA: Diagnosis not present

## 2014-08-08 NOTE — Addendum Note (Signed)
Addended by: Clyde Lundborg A on: 08/08/2014 08:41 AM   Modules accepted: Orders

## 2014-08-08 NOTE — Telephone Encounter (Signed)
This was my prior message-Keba, did you call her today to inform? I routed this to you yesterday.   Cindy-vascular really wants to get patient in 2-3 range quickly with history of clotting in her arterial system. I had her take 2.5 of her pills on Friday as well and wanted her in to see you as soon as possible next week. Keba-since Hannah Beat be back until next week, can you call patient and let her know she will hear from [place] Monday? I called her tonight but didn't know Jenny Reichmann was out

## 2014-08-08 NOTE — Telephone Encounter (Signed)
Pt states Dr. Yong Channel made mention of concern with her INR level.  Pt is inquiring if she needs to come in Monday morning to have her INR level checked again.

## 2014-08-11 ENCOUNTER — Ambulatory Visit (INDEPENDENT_AMBULATORY_CARE_PROVIDER_SITE_OTHER): Payer: Medicare Other | Admitting: General Practice

## 2014-08-11 ENCOUNTER — Telehealth: Payer: Self-pay | Admitting: Family Medicine

## 2014-08-11 ENCOUNTER — Telehealth: Payer: Self-pay | Admitting: General Practice

## 2014-08-11 ENCOUNTER — Other Ambulatory Visit: Payer: Self-pay | Admitting: Orthopaedic Surgery

## 2014-08-11 DIAGNOSIS — M25561 Pain in right knee: Secondary | ICD-10-CM

## 2014-08-11 DIAGNOSIS — Z5181 Encounter for therapeutic drug level monitoring: Secondary | ICD-10-CM

## 2014-08-11 LAB — POCT INR: INR: 2.7

## 2014-08-11 MED ORDER — HYDROCODONE-ACETAMINOPHEN 5-325 MG PO TABS: 1.0000 | ORAL_TABLET | Freq: Four times a day (QID) | ORAL | Status: DC | PRN

## 2014-08-11 NOTE — Telephone Encounter (Signed)
Pt notified that Rx is up front.

## 2014-08-11 NOTE — Telephone Encounter (Signed)
[name] [name] from [facility] is faxing over OV note from Friday.

## 2014-08-11 NOTE — Telephone Encounter (Signed)
MRI decision was to be made under care of orthopedics-she was supposed to be seen last week. [name] [name] will try to get a copy of notes.   I just wanted her on the schedule when [name] [name] is here so since she is here, that is perfect for her to be seen this morning.   I will provide a pain medicine refill but we really need to know underlying cause here and need the ortho notes

## 2014-08-11 NOTE — Progress Notes (Signed)
Pre visit review using our clinic review tool, if applicable. No additional management support is needed unless otherwise documented below in the visit note. 

## 2014-08-11 NOTE — Telephone Encounter (Signed)
-----   Message from Marin Olp, MD sent at 08/11/2014 12:27 PM EDT ----- 2-3 is fine, her vascular surgeon just wanted to make sure we are a little more aggressive about getting her into this range. I would be ok if you said 2-3.5 although I know that is atypical.  Annie Main  Thanks for your help!   ----- Message -----    From: Warden Fillers, RN    Sent: 08/11/2014  12:10 PM      To: Marin Olp, MD  Dr. Yong Channel,  I checked patient's INR today.  She is 2.7.  I increased her warfarin 2.5 mg per week.  Should her goal range be changed to 2.5 - 3.5? ----- Message -----    From: Marin Olp, MD    Sent: 08/07/2014   6:47 PM      To: Warden Fillers, RN  Cindy-vascular really wants to get patient in 2-3 range quickly with history of clotting in her arterial system. I had her take 2.5 of her pills on Friday as well and wanted her in to see you as soon as possible next week. Keba-since Hannah Beat be back until next week, can you call patient and let her know she will hear from [place] Monday? I called her tonight but didn't know Jenny Reichmann was out

## 2014-08-11 NOTE — Telephone Encounter (Signed)
Rx faxed to [facility]

## 2014-08-11 NOTE — Telephone Encounter (Signed)
[facility] pharmacy called stating they received HYDROcodone-acetaminophen (NORCO/VICODIN) 5-325 MG per tablet faxed to them and they can't accept narcotics rx via fax.  Patient needs to come to the office to pick rx up.

## 2014-08-11 NOTE — Telephone Encounter (Signed)
Pt called office after speaking with Dr. Yong Channel late Friday evening to schedule appt for INR check this morning.  Pt scheduled at 11am for INR check.  Pt also requesting refill of pain medication and to check the status of MRI.  I advised pt we would call her back regarding these requests.  I do not see an order for an MRI, please advise if pt is supposed to be scheduled for one.

## 2014-08-12 ENCOUNTER — Telehealth: Payer: Self-pay | Admitting: Family Medicine

## 2014-08-12 NOTE — Telephone Encounter (Signed)
Noted  

## 2014-08-12 NOTE — Telephone Encounter (Signed)
Jan w/ [facility] states some I & R orders for this pt did not get signed from 2015 and they need a doctors signature. It used to be Dr Arnoldo Morale, but she states if dr hunter can sign, just cross out dr Arnoldo Morale name and they will appreciate  Jan will fax to me and I will bring to you if dr hunter can sign  Just wanted to give you a heads up

## 2014-08-15 ENCOUNTER — Other Ambulatory Visit: Payer: Self-pay | Admitting: *Deleted

## 2014-08-15 MED ORDER — SIMVASTATIN 40 MG PO TABS: 40.0000 mg | ORAL_TABLET | Freq: Every day | ORAL | Status: DC

## 2014-08-20 ENCOUNTER — Ambulatory Visit
Admission: RE | Admit: 2014-08-20 | Discharge: 2014-08-20 | Disposition: A | Discharge status: Home / Self Care | Payer: Medicare Other | Source: Ambulatory Visit | Attending: Orthopaedic Surgery | Admitting: Orthopaedic Surgery

## 2014-08-20 DIAGNOSIS — M25561 Pain in right knee: Secondary | ICD-10-CM

## 2014-08-20 DIAGNOSIS — M7121 Synovial cyst of popliteal space [Baker], right knee: Secondary | ICD-10-CM | POA: Diagnosis not present

## 2014-08-20 DIAGNOSIS — M23321 Other meniscus derangements, posterior horn of medial meniscus, right knee: Secondary | ICD-10-CM | POA: Diagnosis not present

## 2014-08-21 ENCOUNTER — Ambulatory Visit (INDEPENDENT_AMBULATORY_CARE_PROVIDER_SITE_OTHER): Payer: Medicare Other | Admitting: General Practice

## 2014-08-21 ENCOUNTER — Telehealth: Payer: Self-pay

## 2014-08-21 DIAGNOSIS — Z5181 Encounter for therapeutic drug level monitoring: Secondary | ICD-10-CM

## 2014-08-21 DIAGNOSIS — I749 Embolism and thrombosis of unspecified artery: Secondary | ICD-10-CM

## 2014-08-21 LAB — POCT INR: INR: 2.3

## 2014-08-21 NOTE — Progress Notes (Signed)
Pre visit review using our clinic review tool, if applicable. No additional management support is needed unless otherwise documented below in the visit note. 

## 2014-08-21 NOTE — Telephone Encounter (Signed)
Yes, we will need a copy of her MRI and ortho's plan after the MRI before we see her. The pain medicine was for acute needs during evaluation but it is not planned for permanent use.

## 2014-08-21 NOTE — Telephone Encounter (Signed)
Pt came in for Coumadin today requesting refill on Hydrocodone. We refilled this for pt on 08/11/14 and pt states she is taking 2 tablets twice daily. The instructions state take 1-2 tablets prn. Since pt is taking differently will pt need OV to discuss this with you?

## 2014-08-21 NOTE — Telephone Encounter (Signed)
Report left on your desk, MRI not scheduled until 09/01/14.

## 2014-08-21 NOTE — Telephone Encounter (Signed)
Bevelyn Ngo, any way they can move up the date of MRI?  I left a note on your desk about the medication.

## 2014-08-21 NOTE — Telephone Encounter (Signed)
Called Tammy at [facility] and left a message on her vm tcb regarding MRI and orthos plan.

## 2014-08-22 ENCOUNTER — Other Ambulatory Visit: Payer: Self-pay

## 2014-08-22 ENCOUNTER — Ambulatory Visit: Payer: Medicare Other

## 2014-08-22 DIAGNOSIS — M7121 Synovial cyst of popliteal space [Baker], right knee: Secondary | ICD-10-CM | POA: Diagnosis not present

## 2014-08-22 DIAGNOSIS — M94261 Chondromalacia, right knee: Secondary | ICD-10-CM | POA: Diagnosis not present

## 2014-08-22 MED ORDER — HYDROCODONE-ACETAMINOPHEN 5-325 MG PO TABS: 1.0000 | ORAL_TABLET | Freq: Three times a day (TID) | ORAL | Status: DC | PRN

## 2014-08-22 MED ORDER — HYDROCODONE-ACETAMINOPHEN 5-325 MG PO TABS: ORAL_TABLET | ORAL | Status: DC

## 2014-08-22 NOTE — Telephone Encounter (Signed)
MRI unable to be moved up, pt to keep appt. On 09/01/14

## 2014-08-27 ENCOUNTER — Encounter: Payer: Self-pay | Admitting: Vascular Surgery

## 2014-08-28 ENCOUNTER — Encounter: Payer: Self-pay | Admitting: Vascular Surgery

## 2014-08-28 ENCOUNTER — Ambulatory Visit (INDEPENDENT_AMBULATORY_CARE_PROVIDER_SITE_OTHER): Payer: Medicare Other | Admitting: Vascular Surgery

## 2014-08-28 VITALS — BP 147/68 | HR 64 | Resp 16 | Ht 60.0 in | Wt 177.0 lb

## 2014-08-28 DIAGNOSIS — M25561 Pain in right knee: Secondary | ICD-10-CM | POA: Diagnosis not present

## 2014-08-28 DIAGNOSIS — M79669 Pain in unspecified lower leg: Secondary | ICD-10-CM | POA: Insufficient documentation

## 2014-08-28 DIAGNOSIS — M7989 Other specified soft tissue disorders: Secondary | ICD-10-CM

## 2014-08-28 DIAGNOSIS — M79661 Pain in right lower leg: Secondary | ICD-10-CM | POA: Diagnosis not present

## 2014-08-28 DIAGNOSIS — M25571 Pain in right ankle and joints of right foot: Secondary | ICD-10-CM | POA: Insufficient documentation

## 2014-08-28 DIAGNOSIS — I739 Peripheral vascular disease, unspecified: Secondary | ICD-10-CM

## 2014-08-28 DIAGNOSIS — M7121 Synovial cyst of popliteal space [Baker], right knee: Secondary | ICD-10-CM

## 2014-08-28 DIAGNOSIS — M7120 Synovial cyst of popliteal space [Baker], unspecified knee: Secondary | ICD-10-CM | POA: Insufficient documentation

## 2014-08-28 DIAGNOSIS — I70219 Atherosclerosis of native arteries of extremities with intermittent claudication, unspecified extremity: Secondary | ICD-10-CM

## 2014-08-28 MED ORDER — OXYCODONE-ACETAMINOPHEN 5-325 MG PO TABS: 1.0000 | ORAL_TABLET | ORAL | Status: DC | PRN

## 2014-08-28 NOTE — Progress Notes (Signed)
Patient is an 81 year old female returns for followup today. She underwent thrombolysis for a right femoral to below-knee popliteal bypass graft in June 2015. There was no significant stenosis noted after the thrombolysis. She was placed on Coumadin at discharge. She previously underwent right femoral to above-knee popliteal bypass by [name] in 2006. I revised this and extended to the below-knee popliteal artery and 2010. She has also previously had left femoral to above-knee popliteal bypass by [name] 2006. She currently is experiencing no claudication symptoms. She has no rest pain. She is satisfied with her walking distance.  However, she had an acute onset of right lower extremity leg swelling on April 10. She was seen in the emergency room by my partner Dr. [name] at that time. She was noted have a patent graft. She subsequently also had a venous duplex ultrasound which showed no evidence of DVT. The patient was having diffuse pain in her right leg at that time. Primarily at this point she has pain in the right medial knee. She has pain in this area that is requiring her to take narcotic pain medication for sleep at night. She has difficulty walking due to pain in the right knee and feels like the knee may give way. She was recently seen by Dr. [name] from orthopedics. Her MRI scan showed a Baker's cyst as well as some degenerative cartilage changes in the right knee. She received a steroid injection of her knee. The swelling in her leg has improved significantly. However the pain has persisted. She denies any fever or chills. She has had no redness around her old scars. She states she did have some swelling at the above-knee incision but this has improved somewhat.  She has been on Coumadin but they have had difficulty regulating her levels. She has rarely been above an INR of 2. This is followed by [name]. She also has long-standing hypertension and reviewed her blood pressure numbers  with me today. For the most part her systolic blood pressure remains in the 130s to high 150s. Her diastolic blood pressure is reasonable usually below 90 and rarely below 80. She stated that her primary care physician Dr. [name] wished for me to review these numbers with her today.  Physical exam: Filed Vitals:   08/28/14 1318  BP: 147/68  Pulse: 64  Resp: 16  Height: 5' (1.524 m)  Weight: 177 lb (80.287 kg)  SpO2: 98%   Chest: Clear to auscultation bilaterally  Cardiac: Regular rate and rhythm without murmur Bilateral lower extremities one plus DP pulses 2+ femoral pulses bilaterally  Data: I reviewed the patient's ABIs dated February 18. This showed an ABI on the right of 0.89 left 0.90.  She did have some slightly increased velocities at the proximal inflow artery but the waveforms were triphasic at this level and we opted for observation rather than an intervention. ABIs on April 10 were 0.8 bilaterally. Waveforms were triphasic to biphasic. Arterial duplex showed a widely patent bypass graft.  Assessment: 1.  Status post thrombolysis for occlusion of right femoropopliteal bypass graft. Possible mild/moderate proximal anastomotic narrowing right leg Currently on Coumadin.     2.  New onset right knee pain.    3.  Hypertension  Plan:  #1 possible mild narrowing of proximal anastomosis of right femoropopliteal bypass graft. However her ABIs have not really changed she has a palpable pulse in her right foot she does not experience any claudication symptoms. Although had previously talked with  her about an arteriogram to further evaluate this I would not consider doing this or any revisions of her bypass graft with her current pain symptoms in her right leg.  #2 right knee pain I am unsure of the etiology of this. However it would be extremely unusual to have isolated right knee pain from arterial occlusive disease. This would be even more rare with a patent bypass graft which she  now has had documented on multiple studies and a palpable pulse in her foot on clinical exam. The only other possibility I would investigate from a vascular standpoint would be to rule out graft infection. We will obtain a CT angiogram of her bypass graft to see if there is any perigraft fluid or inflammation. If this test proves to be negative I do not believe we can explain her pain from a vascular standpoint. At that point I would most likely refer her back to Dr. [name].  The patient was given a prescription today for Percocet No. 30 dispensed to last her for 1 week until she returns after her CT angiogram.  #3 hypertension reasonable control currently although it would be nice to have her systolic blood pressure at least less than 140. I will leave further titration of her blood pressure medications to her primary care physician.   [name] Hinds, MD [facility] Office: [phone] Pager: [phone]

## 2014-08-29 ENCOUNTER — Ambulatory Visit
Admission: RE | Admit: 2014-08-29 | Discharge: 2014-08-29 | Disposition: A | Discharge status: Home / Self Care | Payer: Medicare Other | Source: Ambulatory Visit | Attending: Vascular Surgery | Admitting: Vascular Surgery

## 2014-08-29 DIAGNOSIS — I745 Embolism and thrombosis of iliac artery: Secondary | ICD-10-CM | POA: Diagnosis not present

## 2014-08-29 DIAGNOSIS — I70219 Atherosclerosis of native arteries of extremities with intermittent claudication, unspecified extremity: Secondary | ICD-10-CM

## 2014-08-29 MED ORDER — IOPAMIDOL (ISOVUE-370) INJECTION 76%
125.0000 mL | Freq: Once | INTRAVENOUS | Status: AC | PRN
  Administered 2014-08-29: 125 mL via INTRAVENOUS

## 2014-09-01 ENCOUNTER — Ambulatory Visit (INDEPENDENT_AMBULATORY_CARE_PROVIDER_SITE_OTHER): Payer: Medicare Other | Admitting: General Practice

## 2014-09-01 ENCOUNTER — Encounter: Payer: Self-pay | Admitting: Vascular Surgery

## 2014-09-01 ENCOUNTER — Other Ambulatory Visit: Payer: Medicare Other

## 2014-09-01 DIAGNOSIS — I749 Embolism and thrombosis of unspecified artery: Secondary | ICD-10-CM

## 2014-09-01 DIAGNOSIS — Z5181 Encounter for therapeutic drug level monitoring: Secondary | ICD-10-CM | POA: Diagnosis not present

## 2014-09-01 LAB — POCT INR: INR: 2.3

## 2014-09-01 NOTE — Progress Notes (Signed)
Pre visit review using our clinic review tool, if applicable. No additional management support is needed unless otherwise documented below in the visit note. 

## 2014-09-01 NOTE — Progress Notes (Signed)
Patient ID: [name] [name], female   DOB: 01-25-1925, 81 y.o.   MRN: [id number]    I reviewed the pt CTA abdomen pelvis with runoff today.  She may have some narrowing of her right common iliac artery.  Her bilateral fem pop bypass grafts are patent with no inflammation or surrounding fluid to suggest infection.  I do not believe there are any findings on this CT to explain her right knee pain.  She has follow up scheduled with me soon.  [name], MD [facility] Office: [phone] Pager: [phone]

## 2014-09-03 ENCOUNTER — Encounter: Payer: Self-pay | Admitting: Vascular Surgery

## 2014-09-04 ENCOUNTER — Encounter: Payer: Self-pay | Admitting: Vascular Surgery

## 2014-09-04 ENCOUNTER — Ambulatory Visit (INDEPENDENT_AMBULATORY_CARE_PROVIDER_SITE_OTHER): Payer: Medicare Other | Admitting: Vascular Surgery

## 2014-09-04 VITALS — BP 141/75 | HR 65 | Resp 16 | Ht 62.0 in | Wt 168.0 lb

## 2014-09-04 DIAGNOSIS — M25561 Pain in right knee: Secondary | ICD-10-CM

## 2014-09-04 DIAGNOSIS — I739 Peripheral vascular disease, unspecified: Secondary | ICD-10-CM | POA: Diagnosis not present

## 2014-09-04 MED ORDER — OXYCODONE-ACETAMINOPHEN 5-325 MG PO TABS: 1.0000 | ORAL_TABLET | ORAL | Status: DC | PRN

## 2014-09-04 NOTE — Progress Notes (Signed)
Patient is an 81 year old female returns for followup today after recent CT angiogram abdomen and pelvis with runoff. She had an acute onset of right lower extremity leg swelling on April 10. She was noted have a patent graft. She subsequently also had a venous duplex ultrasound which showed no evidence of DVT. The patient was having diffuse pain in her right leg at that time. Primarily at this point she has pain in the right medial knee. She has pain in this area that is requiring her to take narcotic pain medication for sleep at night. She has difficulty walking due to pain in the right knee and feels like the knee may give way. She was recently seen by Dr. [name] from orthopedics. Her MRI scan showed a Baker's cyst as well as some degenerative cartilage changes in the right knee. She received a steroid injection of her knee. The swelling in her leg has improved significantly. However the pain has persisted although it is improved with for Percocet per day. She denies any fever or chills. She has had no redness around her old scars. She states she did have some swelling at the above-knee incision but this has improved somewhat.  Her past vascular history is significant for the fact that she underwent thrombolysis for a right femoral to below-knee popliteal bypass graft in June 2015. There was no significant stenosis noted after the thrombolysis. She was placed on Coumadin at discharge. She previously underwent right femoral to above-knee popliteal bypass by [name] in 2006. I revised this and extended to the below-knee popliteal artery and 2010. She has also previously had left femoral to above-knee popliteal bypass by [name] 2006. She currently is experiencing no claudication symptoms. She has no rest pain. She is satisfied with her walking distance.  She has been on Coumadin but they have had difficulty regulating her levels. Her last INR was greater than 2. They're continuing to adjust her  Coumadin.  Physical exam: Filed Vitals:   09/04/14 1558 09/04/14 1601  BP: 155/75 141/75  Pulse: 69 65  Resp: 16   Height: 5\' 2"  (1.575 m)   Weight: 168 lb (76.204 kg)    Bilateral lower extremities one plus DP pulses bilaterally  Data: I reviewed the patient's ABIs dated February 18. This showed an ABI on the right of 0.89 left 0.90.  She did have some slightly increased velocities at the proximal inflow artery but the waveforms were triphasic at this level and we opted for observation rather than an intervention. ABIs on April 10 were 0.8 bilaterally. Waveforms were triphasic to biphasic. Arterial duplex showed a widely patent bypass graft.  I reviewed the images of her CT angiogram today. This showed no perigraft inflammation or fluid collection. She has a widely patent bypass graft. She does have mild narrowing of her right common iliac artery but easily palpable femoral and dorsalis pedis pulse suggested that this is most likely not flow limiting.  Assessment:  right knee pain I am unsure of the etiology of this. However it would be extremely unusual to have isolated right knee pain from arterial occlusive disease. This would be even more rare with a patent bypass graft which she now has had documented on multiple studies and a palpable pulse in her foot on clinical exam. She now also has a CT angiogram which again shows a widely patent bypass with good flow to the foot possible mild narrowing of the right common iliac artery which again would not cause right  knee pain.   At that point I would refer her back to Dr. [name].  The patient was given a prescription today for Percocet No. 30 dispensed to last her for 1 week until she returns to see Dr. [name]. Obviously narcotics are not going to be a good long-term solution. However she is significantly more comfortable today on clinical exam.  Patient will follow-up with me in 6 weeks' time. If her right knee pain is resolved at that point we will  discuss whether or not to do further observation for her legs or consider an arteriogram. Again I did not believe the right knee pain is related to arterial occlusive disease.  [name] Hinds, MD [facility] Office: [phone] Pager: [phone]

## 2014-09-04 NOTE — Progress Notes (Signed)
Filed Vitals:   09/04/14 1558 09/04/14 1601  BP: 155/75 141/75  Pulse: 69 65  Resp: 16   Height: 5\' 2"  (1.575 m)   Weight: 168 lb (76.204 kg)    Body mass index is 30.72 kg/(m^2).

## 2014-09-11 DIAGNOSIS — M94261 Chondromalacia, right knee: Secondary | ICD-10-CM | POA: Diagnosis not present

## 2014-09-17 ENCOUNTER — Telehealth: Payer: Self-pay | Admitting: Neurology

## 2014-09-17 MED ORDER — CARBAMAZEPINE 200 MG PO TABS: 100.0000 mg | ORAL_TABLET | Freq: Every day | ORAL | Status: DC

## 2014-09-17 NOTE — Telephone Encounter (Signed)
Patient is calling to get a new Rx for carbamazepine (TEGRETOL) 200 MG tablet. The patient is changing pharmacies. Please call to [facility]. Thank you.

## 2014-09-17 NOTE — Telephone Encounter (Signed)
Rx has been sent  

## 2014-09-18 ENCOUNTER — Ambulatory Visit (INDEPENDENT_AMBULATORY_CARE_PROVIDER_SITE_OTHER): Payer: Medicare Other | Admitting: General Practice

## 2014-09-18 DIAGNOSIS — I749 Embolism and thrombosis of unspecified artery: Secondary | ICD-10-CM | POA: Diagnosis not present

## 2014-09-18 DIAGNOSIS — Z5181 Encounter for therapeutic drug level monitoring: Secondary | ICD-10-CM

## 2014-09-18 LAB — POCT INR: INR: 2.1

## 2014-09-18 NOTE — Progress Notes (Signed)
Pre visit review using our clinic review tool, if applicable. No additional management support is needed unless otherwise documented below in the visit note. 

## 2014-09-18 NOTE — Telephone Encounter (Signed)
Pt's daughter [name] [phone]) called and requested to speak with someone regarding the medication carbamazepine (TEGRETOL) 200 MG tablet. The pt has been taking 600 mg daily but the new prescription states for her to only take 100 mg a day. Please call and advise.

## 2014-09-18 NOTE — Telephone Encounter (Signed)
Last OV note says: We will decrease the carbamazepine to 100 mg to see if that helps with her sleepiness. If her facial pain returns, she will have to go back to the 200 mg.  I called back an spoke with the patient.  She said she is currently taking one tablet (200mg ) nightly.  Advised we can send a new Rx for this dose.  States she just picked up her prescription, so she does not need a refill at this time, but would like a new Rx sent for one tablet (200mg ) nightly when Rx is needed.

## 2014-09-25 DIAGNOSIS — M25561 Pain in right knee: Secondary | ICD-10-CM | POA: Diagnosis not present

## 2014-09-25 DIAGNOSIS — M25461 Effusion, right knee: Secondary | ICD-10-CM | POA: Diagnosis not present

## 2014-09-25 DIAGNOSIS — M7121 Synovial cyst of popliteal space [Baker], right knee: Secondary | ICD-10-CM | POA: Diagnosis not present

## 2014-09-25 DIAGNOSIS — M25661 Stiffness of right knee, not elsewhere classified: Secondary | ICD-10-CM | POA: Diagnosis not present

## 2014-09-30 DIAGNOSIS — M25661 Stiffness of right knee, not elsewhere classified: Secondary | ICD-10-CM | POA: Diagnosis not present

## 2014-09-30 DIAGNOSIS — M25561 Pain in right knee: Secondary | ICD-10-CM | POA: Diagnosis not present

## 2014-09-30 DIAGNOSIS — M25461 Effusion, right knee: Secondary | ICD-10-CM | POA: Diagnosis not present

## 2014-09-30 DIAGNOSIS — M7121 Synovial cyst of popliteal space [Baker], right knee: Secondary | ICD-10-CM | POA: Diagnosis not present

## 2014-10-01 ENCOUNTER — Ambulatory Visit (INDEPENDENT_AMBULATORY_CARE_PROVIDER_SITE_OTHER): Payer: Medicare Other

## 2014-10-01 ENCOUNTER — Ambulatory Visit (INDEPENDENT_AMBULATORY_CARE_PROVIDER_SITE_OTHER): Payer: Medicare Other | Admitting: *Deleted

## 2014-10-01 DIAGNOSIS — M25461 Effusion, right knee: Secondary | ICD-10-CM | POA: Diagnosis not present

## 2014-10-01 DIAGNOSIS — M7121 Synovial cyst of popliteal space [Baker], right knee: Secondary | ICD-10-CM | POA: Diagnosis not present

## 2014-10-01 DIAGNOSIS — M25561 Pain in right knee: Secondary | ICD-10-CM | POA: Diagnosis not present

## 2014-10-01 DIAGNOSIS — M25661 Stiffness of right knee, not elsewhere classified: Secondary | ICD-10-CM | POA: Diagnosis not present

## 2014-10-01 DIAGNOSIS — I749 Embolism and thrombosis of unspecified artery: Secondary | ICD-10-CM | POA: Diagnosis not present

## 2014-10-01 DIAGNOSIS — J309 Allergic rhinitis, unspecified: Secondary | ICD-10-CM | POA: Diagnosis not present

## 2014-10-01 DIAGNOSIS — Z5181 Encounter for therapeutic drug level monitoring: Secondary | ICD-10-CM

## 2014-10-01 LAB — POCT INR: INR: 3.7

## 2014-10-01 NOTE — Progress Notes (Signed)
I have reviewed and agree with the plan. 

## 2014-10-01 NOTE — Progress Notes (Signed)
Pre visit review using our clinic review tool, if applicable. No additional management support is needed unless otherwise documented below in the visit note. 

## 2014-10-02 DIAGNOSIS — M25561 Pain in right knee: Secondary | ICD-10-CM | POA: Diagnosis not present

## 2014-10-02 DIAGNOSIS — M25661 Stiffness of right knee, not elsewhere classified: Secondary | ICD-10-CM | POA: Diagnosis not present

## 2014-10-02 DIAGNOSIS — M7121 Synovial cyst of popliteal space [Baker], right knee: Secondary | ICD-10-CM | POA: Diagnosis not present

## 2014-10-02 DIAGNOSIS — M25461 Effusion, right knee: Secondary | ICD-10-CM | POA: Diagnosis not present

## 2014-10-03 ENCOUNTER — Ambulatory Visit: Payer: Medicare Other

## 2014-10-03 ENCOUNTER — Other Ambulatory Visit: Payer: Self-pay

## 2014-10-03 ENCOUNTER — Telehealth: Payer: Self-pay | Admitting: Family Medicine

## 2014-10-03 MED ORDER — WARFARIN SODIUM 5 MG PO TABS: 10.0000 mg | ORAL_TABLET | ORAL | Status: DC

## 2014-10-03 NOTE — Telephone Encounter (Signed)
Pt request refill warfarin (COUMADIN) 5 MG tablet  Sent to [facility]  Pt had a refill on the rx and [facility] no longer carries warfarin. So pt just needs rx sent to new pharm

## 2014-10-03 NOTE — Telephone Encounter (Signed)
Medication sent to [facility] [address]

## 2014-10-06 DIAGNOSIS — M25461 Effusion, right knee: Secondary | ICD-10-CM | POA: Diagnosis not present

## 2014-10-06 DIAGNOSIS — M25661 Stiffness of right knee, not elsewhere classified: Secondary | ICD-10-CM | POA: Diagnosis not present

## 2014-10-06 DIAGNOSIS — M7121 Synovial cyst of popliteal space [Baker], right knee: Secondary | ICD-10-CM | POA: Diagnosis not present

## 2014-10-06 DIAGNOSIS — M25561 Pain in right knee: Secondary | ICD-10-CM | POA: Diagnosis not present

## 2014-10-07 DIAGNOSIS — M7121 Synovial cyst of popliteal space [Baker], right knee: Secondary | ICD-10-CM | POA: Diagnosis not present

## 2014-10-07 DIAGNOSIS — M25661 Stiffness of right knee, not elsewhere classified: Secondary | ICD-10-CM | POA: Diagnosis not present

## 2014-10-07 DIAGNOSIS — M25561 Pain in right knee: Secondary | ICD-10-CM | POA: Diagnosis not present

## 2014-10-07 DIAGNOSIS — M25461 Effusion, right knee: Secondary | ICD-10-CM | POA: Diagnosis not present

## 2014-10-09 DIAGNOSIS — M25661 Stiffness of right knee, not elsewhere classified: Secondary | ICD-10-CM | POA: Diagnosis not present

## 2014-10-09 DIAGNOSIS — M7121 Synovial cyst of popliteal space [Baker], right knee: Secondary | ICD-10-CM | POA: Diagnosis not present

## 2014-10-09 DIAGNOSIS — M25461 Effusion, right knee: Secondary | ICD-10-CM | POA: Diagnosis not present

## 2014-10-09 DIAGNOSIS — M25561 Pain in right knee: Secondary | ICD-10-CM | POA: Diagnosis not present

## 2014-10-10 ENCOUNTER — Ambulatory Visit (INDEPENDENT_AMBULATORY_CARE_PROVIDER_SITE_OTHER): Payer: Medicare Other

## 2014-10-10 DIAGNOSIS — J309 Allergic rhinitis, unspecified: Secondary | ICD-10-CM | POA: Diagnosis not present

## 2014-10-13 DIAGNOSIS — M25661 Stiffness of right knee, not elsewhere classified: Secondary | ICD-10-CM | POA: Diagnosis not present

## 2014-10-13 DIAGNOSIS — M25461 Effusion, right knee: Secondary | ICD-10-CM | POA: Diagnosis not present

## 2014-10-13 DIAGNOSIS — M7121 Synovial cyst of popliteal space [Baker], right knee: Secondary | ICD-10-CM | POA: Diagnosis not present

## 2014-10-13 DIAGNOSIS — M25561 Pain in right knee: Secondary | ICD-10-CM | POA: Diagnosis not present

## 2014-10-14 DIAGNOSIS — M25561 Pain in right knee: Secondary | ICD-10-CM | POA: Diagnosis not present

## 2014-10-14 DIAGNOSIS — M7121 Synovial cyst of popliteal space [Baker], right knee: Secondary | ICD-10-CM | POA: Diagnosis not present

## 2014-10-14 DIAGNOSIS — M25661 Stiffness of right knee, not elsewhere classified: Secondary | ICD-10-CM | POA: Diagnosis not present

## 2014-10-14 DIAGNOSIS — M25461 Effusion, right knee: Secondary | ICD-10-CM | POA: Diagnosis not present

## 2014-10-16 ENCOUNTER — Ambulatory Visit (INDEPENDENT_AMBULATORY_CARE_PROVIDER_SITE_OTHER): Payer: Medicare Other | Admitting: General Practice

## 2014-10-16 DIAGNOSIS — M25661 Stiffness of right knee, not elsewhere classified: Secondary | ICD-10-CM | POA: Diagnosis not present

## 2014-10-16 DIAGNOSIS — I749 Embolism and thrombosis of unspecified artery: Secondary | ICD-10-CM | POA: Diagnosis not present

## 2014-10-16 DIAGNOSIS — Z5181 Encounter for therapeutic drug level monitoring: Secondary | ICD-10-CM

## 2014-10-16 DIAGNOSIS — M7121 Synovial cyst of popliteal space [Baker], right knee: Secondary | ICD-10-CM | POA: Diagnosis not present

## 2014-10-16 DIAGNOSIS — M25461 Effusion, right knee: Secondary | ICD-10-CM | POA: Diagnosis not present

## 2014-10-16 DIAGNOSIS — M25561 Pain in right knee: Secondary | ICD-10-CM | POA: Diagnosis not present

## 2014-10-16 LAB — POCT INR: INR: 4.1

## 2014-10-16 NOTE — Progress Notes (Signed)
Pre visit review using our clinic review tool, if applicable. No additional management support is needed unless otherwise documented below in the visit note. 

## 2014-10-17 ENCOUNTER — Ambulatory Visit (INDEPENDENT_AMBULATORY_CARE_PROVIDER_SITE_OTHER): Payer: Medicare Other

## 2014-10-17 DIAGNOSIS — J309 Allergic rhinitis, unspecified: Secondary | ICD-10-CM | POA: Diagnosis not present

## 2014-10-20 ENCOUNTER — Other Ambulatory Visit: Payer: Self-pay

## 2014-10-21 ENCOUNTER — Telehealth: Payer: Self-pay | Admitting: Internal Medicine

## 2014-10-21 DIAGNOSIS — M25461 Effusion, right knee: Secondary | ICD-10-CM | POA: Diagnosis not present

## 2014-10-21 DIAGNOSIS — M25661 Stiffness of right knee, not elsewhere classified: Secondary | ICD-10-CM | POA: Diagnosis not present

## 2014-10-21 DIAGNOSIS — M7121 Synovial cyst of popliteal space [Baker], right knee: Secondary | ICD-10-CM | POA: Diagnosis not present

## 2014-10-21 DIAGNOSIS — M25561 Pain in right knee: Secondary | ICD-10-CM | POA: Diagnosis not present

## 2014-10-21 NOTE — Telephone Encounter (Signed)
Date Mixed: 10/21/2014 Vial: AB Strength: AB Here/Mail/Pick Up: Here Mixed By: Desmond Dike, CMA

## 2014-10-22 ENCOUNTER — Ambulatory Visit (INDEPENDENT_AMBULATORY_CARE_PROVIDER_SITE_OTHER): Payer: Medicare Other

## 2014-10-22 ENCOUNTER — Encounter: Payer: Self-pay | Admitting: Vascular Surgery

## 2014-10-22 DIAGNOSIS — J309 Allergic rhinitis, unspecified: Secondary | ICD-10-CM | POA: Diagnosis not present

## 2014-10-23 ENCOUNTER — Ambulatory Visit (INDEPENDENT_AMBULATORY_CARE_PROVIDER_SITE_OTHER): Payer: Medicare Other | Admitting: Vascular Surgery

## 2014-10-23 ENCOUNTER — Encounter: Payer: Self-pay | Admitting: Vascular Surgery

## 2014-10-23 VITALS — BP 143/76 | HR 64 | Resp 14 | Ht 62.0 in | Wt 180.0 lb

## 2014-10-23 DIAGNOSIS — M25561 Pain in right knee: Secondary | ICD-10-CM | POA: Diagnosis not present

## 2014-10-23 DIAGNOSIS — I739 Peripheral vascular disease, unspecified: Secondary | ICD-10-CM

## 2014-10-23 DIAGNOSIS — M7121 Synovial cyst of popliteal space [Baker], right knee: Secondary | ICD-10-CM | POA: Diagnosis not present

## 2014-10-23 DIAGNOSIS — M25461 Effusion, right knee: Secondary | ICD-10-CM | POA: Diagnosis not present

## 2014-10-23 DIAGNOSIS — M25661 Stiffness of right knee, not elsewhere classified: Secondary | ICD-10-CM | POA: Diagnosis not present

## 2014-10-23 NOTE — Progress Notes (Signed)
Filed Vitals:   10/23/14 1515 10/23/14 1518  BP: 155/75 143/76  Pulse: 69 64  Resp: 14   Height: 5\' 2"  (1.575 m)   Weight: 180 lb (81.647 kg)

## 2014-10-23 NOTE — Progress Notes (Signed)
Patient is an 81 year old female returns for followup today  She had an acute onset of right lower extremity leg swelling on April 10. She was noted have a patent graft. She subsequently also had a venous duplex ultrasound which showed no evidence of DVT. The patient was having diffuse pain in her right leg at that time. Primarily at this point she has pain in the right medial knee. She has pain in this area that is requiring her to still take tramadol. She has difficulty walking due to pain in the right knee and feels like the knee may give way. She currently is also receiving physical therapy. She was recently seen by Dr. [name] from orthopedics. Her MRI scan showed a Baker's cyst as well as some degenerative cartilage changes in the right knee. She received a steroid injection of her knee. The swelling in her leg has improved significantly. Overall the pain is slightly improved.. She denies any fever or chills. She has had no redness around her old scars. She states she did have some swelling at the above-knee incision but this has improved somewhat.  Her past vascular history is significant for the fact that she underwent thrombolysis for a right femoral to below-knee popliteal bypass graft in June 2015. There was no significant stenosis noted after the thrombolysis. She was placed on Coumadin at discharge. She previously underwent right femoral to above-knee popliteal bypass by [name] in 2006. I revised this and extended to the below-knee popliteal artery and 2010. She has also previously had left femoral to above-knee popliteal bypass by [name] 2006. She currently is experiencing no claudication symptoms. She has no rest pain. She is satisfied with her walking distance.  She has been on Coumadin but they have had difficulty regulating her levels. Her last INR was greater than 2. They're continuing to adjust her Coumadin.  Physical exam:  Filed Vitals:   10/23/14 1515 10/23/14 1518  BP: 155/75  143/76  Pulse: 69 64  Resp: 14   Height: 5\' 2"  (1.575 m)   Weight: 180 lb (81.647 kg)    Bilateral lower extremities one plus DP pulses bilaterally  Data: I reviewed the patient's ABIs dated February 18. This showed an ABI on the right of 0.89 left 0.90.  She did have some slightly increased velocities at the proximal inflow artery but the waveforms were triphasic at this level and we opted for observation rather than an intervention. ABIs on April 10 were 0.8 bilaterally. Waveforms were triphasic to biphasic. Arterial duplex showed a widely patent bypass graft.  I reviewed the images of her CT angiogram today. This showed no perigraft inflammation or fluid collection. She has a widely patent bypass graft. She does have mild narrowing of her right common iliac artery but easily palpable femoral and dorsalis pedis pulse suggested that this is most likely not flow limiting.  Assessment:  right knee pain I am unsure of the etiology of this. However it would be extremely unusual to have isolated right knee pain from arterial occlusive disease. This would be even more rare with a patent bypass graft which she now has had documented on multiple studies  She is more comfortable today on clinical exam.  Patient will follow-up with me in 3 months time. If her right knee pain is resolved at that point we will discuss whether or not to do further observation for her legs or consider an arteriogram.   She has follow-up with Dr. [name] in the near future.  [name], MD [facility] Office: [phone] Pager: [phone]

## 2014-10-24 ENCOUNTER — Ambulatory Visit (INDEPENDENT_AMBULATORY_CARE_PROVIDER_SITE_OTHER): Payer: Medicare Other

## 2014-10-24 DIAGNOSIS — J309 Allergic rhinitis, unspecified: Secondary | ICD-10-CM

## 2014-10-28 DIAGNOSIS — M7121 Synovial cyst of popliteal space [Baker], right knee: Secondary | ICD-10-CM | POA: Diagnosis not present

## 2014-10-28 DIAGNOSIS — M25561 Pain in right knee: Secondary | ICD-10-CM | POA: Diagnosis not present

## 2014-10-28 DIAGNOSIS — M25461 Effusion, right knee: Secondary | ICD-10-CM | POA: Diagnosis not present

## 2014-10-28 DIAGNOSIS — M25661 Stiffness of right knee, not elsewhere classified: Secondary | ICD-10-CM | POA: Diagnosis not present

## 2014-10-29 DIAGNOSIS — M7121 Synovial cyst of popliteal space [Baker], right knee: Secondary | ICD-10-CM | POA: Diagnosis not present

## 2014-10-29 DIAGNOSIS — M25561 Pain in right knee: Secondary | ICD-10-CM | POA: Diagnosis not present

## 2014-10-29 DIAGNOSIS — M25461 Effusion, right knee: Secondary | ICD-10-CM | POA: Diagnosis not present

## 2014-10-29 DIAGNOSIS — M25661 Stiffness of right knee, not elsewhere classified: Secondary | ICD-10-CM | POA: Diagnosis not present

## 2014-10-30 DIAGNOSIS — M25661 Stiffness of right knee, not elsewhere classified: Secondary | ICD-10-CM | POA: Diagnosis not present

## 2014-10-30 DIAGNOSIS — M25561 Pain in right knee: Secondary | ICD-10-CM | POA: Diagnosis not present

## 2014-10-30 DIAGNOSIS — M79604 Pain in right leg: Secondary | ICD-10-CM | POA: Diagnosis not present

## 2014-10-30 DIAGNOSIS — M25461 Effusion, right knee: Secondary | ICD-10-CM | POA: Diagnosis not present

## 2014-10-30 DIAGNOSIS — M7121 Synovial cyst of popliteal space [Baker], right knee: Secondary | ICD-10-CM | POA: Diagnosis not present

## 2014-10-31 ENCOUNTER — Ambulatory Visit (INDEPENDENT_AMBULATORY_CARE_PROVIDER_SITE_OTHER): Payer: Medicare Other

## 2014-10-31 DIAGNOSIS — J309 Allergic rhinitis, unspecified: Secondary | ICD-10-CM

## 2014-11-04 DIAGNOSIS — M25661 Stiffness of right knee, not elsewhere classified: Secondary | ICD-10-CM | POA: Diagnosis not present

## 2014-11-04 DIAGNOSIS — M25461 Effusion, right knee: Secondary | ICD-10-CM | POA: Diagnosis not present

## 2014-11-04 DIAGNOSIS — M25561 Pain in right knee: Secondary | ICD-10-CM | POA: Diagnosis not present

## 2014-11-04 DIAGNOSIS — M7121 Synovial cyst of popliteal space [Baker], right knee: Secondary | ICD-10-CM | POA: Diagnosis not present

## 2014-11-05 DIAGNOSIS — M25461 Effusion, right knee: Secondary | ICD-10-CM | POA: Diagnosis not present

## 2014-11-05 DIAGNOSIS — M7121 Synovial cyst of popliteal space [Baker], right knee: Secondary | ICD-10-CM | POA: Diagnosis not present

## 2014-11-05 DIAGNOSIS — M25561 Pain in right knee: Secondary | ICD-10-CM | POA: Diagnosis not present

## 2014-11-05 DIAGNOSIS — M25661 Stiffness of right knee, not elsewhere classified: Secondary | ICD-10-CM | POA: Diagnosis not present

## 2014-11-06 ENCOUNTER — Ambulatory Visit (INDEPENDENT_AMBULATORY_CARE_PROVIDER_SITE_OTHER): Payer: Medicare Other | Admitting: General Practice

## 2014-11-06 DIAGNOSIS — Z5181 Encounter for therapeutic drug level monitoring: Secondary | ICD-10-CM

## 2014-11-06 LAB — POCT INR: INR: 2

## 2014-11-06 NOTE — Progress Notes (Signed)
Pre visit review using our clinic review tool, if applicable. No additional management support is needed unless otherwise documented below in the visit note. 

## 2014-11-07 ENCOUNTER — Ambulatory Visit (INDEPENDENT_AMBULATORY_CARE_PROVIDER_SITE_OTHER): Payer: Medicare Other

## 2014-11-07 DIAGNOSIS — J309 Allergic rhinitis, unspecified: Secondary | ICD-10-CM

## 2014-11-17 ENCOUNTER — Encounter: Payer: Self-pay | Admitting: Internal Medicine

## 2014-11-21 ENCOUNTER — Ambulatory Visit (INDEPENDENT_AMBULATORY_CARE_PROVIDER_SITE_OTHER): Payer: Medicare Other

## 2014-11-21 DIAGNOSIS — J309 Allergic rhinitis, unspecified: Secondary | ICD-10-CM

## 2014-11-24 HISTORY — PX: INJECTION KNEE: SHX2446

## 2014-11-26 ENCOUNTER — Ambulatory Visit (INDEPENDENT_AMBULATORY_CARE_PROVIDER_SITE_OTHER): Payer: Medicare Other | Admitting: Family Medicine

## 2014-11-26 ENCOUNTER — Encounter: Payer: Self-pay | Admitting: Family Medicine

## 2014-11-26 VITALS — BP 165/82 | HR 95 | Temp 98.2°F | Ht 62.0 in | Wt 175.0 lb

## 2014-11-26 DIAGNOSIS — R8299 Other abnormal findings in urine: Secondary | ICD-10-CM | POA: Diagnosis not present

## 2014-11-26 DIAGNOSIS — I739 Peripheral vascular disease, unspecified: Secondary | ICD-10-CM | POA: Diagnosis not present

## 2014-11-26 DIAGNOSIS — R319 Hematuria, unspecified: Secondary | ICD-10-CM | POA: Diagnosis not present

## 2014-11-26 DIAGNOSIS — N390 Urinary tract infection, site not specified: Secondary | ICD-10-CM

## 2014-11-26 DIAGNOSIS — R3989 Other symptoms and signs involving the genitourinary system: Secondary | ICD-10-CM

## 2014-11-26 LAB — POCT URINALYSIS DIPSTICK
Bilirubin, UA: NEGATIVE
GLUCOSE UA: NEGATIVE
Ketones, UA: 5
Nitrite, UA: POSITIVE
SPEC GRAV UA: 1.025
UROBILINOGEN UA: 1
pH, UA: 5.5

## 2014-11-26 MED ORDER — NITROFURANTOIN MONOHYD MACRO 100 MG PO CAPS: 100.0000 mg | ORAL_CAPSULE | Freq: Two times a day (BID) | ORAL | Status: DC

## 2014-11-26 NOTE — Progress Notes (Signed)
   Subjective:    Patient ID: [name] [name], female    DOB: 1924/11/07, 81 y.o.   MRN: [id number]  HPI Here for several days of lower abdominal pressure and seeing some blood in the urine. No fever. She drinks plenty of water every day.    Review of Systems  Constitutional: Negative.   Gastrointestinal: Positive for abdominal pain. Negative for nausea, vomiting and diarrhea.  Genitourinary: Positive for hematuria. Negative for dysuria, urgency, frequency, flank pain and difficulty urinating.       Objective:   Physical Exam  Constitutional: She appears well-developed and well-nourished.  Cardiovascular: Normal rate, regular rhythm, normal heart sounds and intact distal pulses.   Pulmonary/Chest: Effort normal and breath sounds normal.  Abdominal: Soft. Bowel sounds are normal. She exhibits no distension and no mass. There is no tenderness. There is no rebound and no guarding.          Assessment & Plan:  UTI. Treat with Macrobid. Culture the sample.

## 2014-11-26 NOTE — Progress Notes (Signed)
Pre visit review using our clinic review tool, if applicable. No additional management support is needed unless otherwise documented below in the visit note. 

## 2014-11-27 ENCOUNTER — Ambulatory Visit (INDEPENDENT_AMBULATORY_CARE_PROVIDER_SITE_OTHER): Payer: Medicare Other | Admitting: General Practice

## 2014-11-27 ENCOUNTER — Other Ambulatory Visit: Payer: Self-pay | Admitting: General Practice

## 2014-11-27 ENCOUNTER — Telehealth: Payer: Self-pay | Admitting: General Practice

## 2014-11-27 ENCOUNTER — Telehealth: Payer: Self-pay

## 2014-11-27 DIAGNOSIS — I749 Embolism and thrombosis of unspecified artery: Secondary | ICD-10-CM | POA: Diagnosis not present

## 2014-11-27 DIAGNOSIS — Z5181 Encounter for therapeutic drug level monitoring: Secondary | ICD-10-CM

## 2014-11-27 LAB — PROTIME-INR: Prothrombin Time: 106.5 s (ref 9.6–13.1)

## 2014-11-27 MED ORDER — PHYTONADIONE 5 MG PO TABS: ORAL_TABLET | ORAL | Status: DC

## 2014-11-27 NOTE — Progress Notes (Signed)
Pre visit review using our clinic review tool, if applicable. No additional management support is needed unless otherwise documented below in the visit note. 

## 2014-11-27 NOTE — Telephone Encounter (Signed)
-----   Message from Laurey Morale, MD sent at 11/27/2014 10:56 AM EDT ----- Noted. I started her on an antibiotic and I knew this would mess up her INR. Just follow your protocol, thanks  ----- Message -----    From: Warden Fillers, RN    Sent: 11/27/2014  10:18 AM      To: Laurey Morale, MD  Hi Dr. Sarajane Jews,  Pt saw you yesterday for kidney infection.  I checked her INR this morning and she is 8.0 on my coag machine.  I had Nana draw a stat pt/inr.  Just wanted you to know because you will probably be notified of results.  If she is a 10.0 or greater I will call in vitamin K.   Thanks, [name], RN

## 2014-11-27 NOTE — Telephone Encounter (Signed)
RESPONSE: Called and spoke to patient. [name] sent in Vitamin K PO for patient to take and has called patient with instructions. Patient has already taken first dose as prescribed.

## 2014-11-27 NOTE — Telephone Encounter (Signed)
CRITICAL VALUE STICKER  CRITICAL VALUE: INR 10.2 and Protime 106.5  RECEIVER (INITALS):ACM    DATE & TIME NOTIFIED: 8.4.2016   MD NOTIFIED: [name] RN  TIME OF NOTIFICATION: 12:06 pm   RESPONSE:

## 2014-11-28 ENCOUNTER — Ambulatory Visit: Payer: Medicare Other

## 2014-11-28 LAB — URINE CULTURE

## 2014-12-01 ENCOUNTER — Ambulatory Visit (INDEPENDENT_AMBULATORY_CARE_PROVIDER_SITE_OTHER): Payer: Medicare Other | Admitting: General Practice

## 2014-12-01 DIAGNOSIS — Z5181 Encounter for therapeutic drug level monitoring: Secondary | ICD-10-CM | POA: Diagnosis not present

## 2014-12-01 DIAGNOSIS — I749 Embolism and thrombosis of unspecified artery: Secondary | ICD-10-CM | POA: Diagnosis not present

## 2014-12-01 LAB — POCT INR: INR: 1.4

## 2014-12-01 NOTE — Progress Notes (Signed)
Pre visit review using our clinic review tool, if applicable. No additional management support is needed unless otherwise documented below in the visit note. 

## 2014-12-04 ENCOUNTER — Ambulatory Visit (INDEPENDENT_AMBULATORY_CARE_PROVIDER_SITE_OTHER): Payer: Medicare Other | Admitting: Family Medicine

## 2014-12-04 ENCOUNTER — Ambulatory Visit (INDEPENDENT_AMBULATORY_CARE_PROVIDER_SITE_OTHER): Payer: Medicare Other | Admitting: General Practice

## 2014-12-04 ENCOUNTER — Encounter: Payer: Self-pay | Admitting: Family Medicine

## 2014-12-04 VITALS — BP 140/68 | HR 87 | Temp 98.3°F | Wt 175.0 lb

## 2014-12-04 DIAGNOSIS — I739 Peripheral vascular disease, unspecified: Secondary | ICD-10-CM

## 2014-12-04 DIAGNOSIS — Z5181 Encounter for therapeutic drug level monitoring: Secondary | ICD-10-CM

## 2014-12-04 DIAGNOSIS — I10 Essential (primary) hypertension: Secondary | ICD-10-CM | POA: Diagnosis not present

## 2014-12-04 DIAGNOSIS — I749 Embolism and thrombosis of unspecified artery: Secondary | ICD-10-CM

## 2014-12-04 LAB — COMPREHENSIVE METABOLIC PANEL
ALT: 12 U/L (ref 0–35)
AST: 15 U/L (ref 0–37)
Albumin: 4.3 g/dL (ref 3.5–5.2)
Alkaline Phosphatase: 80 U/L (ref 39–117)
BUN: 30 mg/dL — ABNORMAL HIGH (ref 6–23)
CO2: 28 meq/L (ref 19–32)
CREATININE: 1.15 mg/dL (ref 0.40–1.20)
Calcium: 9.8 mg/dL (ref 8.4–10.5)
Chloride: 103 mEq/L (ref 96–112)
GFR: 47.32 mL/min — AB (ref >60.00)
Glucose, Bld: 129 mg/dL — ABNORMAL HIGH (ref 70–99)
Potassium: 4.9 mEq/L (ref 3.5–5.1)
Sodium: 139 mEq/L (ref 135–145)
TOTAL PROTEIN: 7.3 g/dL (ref 6.0–8.3)
Total Bilirubin: 0.3 mg/dL (ref 0.2–1.2)

## 2014-12-04 LAB — POCT INR: INR: 2.6

## 2014-12-04 LAB — CBC WITH DIFFERENTIAL/PLATELET
Basophils Absolute: 0 10*3/uL (ref 0.0–0.1)
Basophils Relative: 0.3 % (ref 0.0–3.0)
EOS PCT: 1.7 % (ref 0.0–5.0)
Eosinophils Absolute: 0.2 10*3/uL (ref 0.0–0.7)
HCT: 39 % (ref 36.0–46.0)
Hemoglobin: 13.1 g/dL (ref 12.0–15.0)
LYMPHS PCT: 21.5 % (ref 12.0–46.0)
Lymphs Abs: 2.7 10*3/uL (ref 0.7–4.0)
MCHC: 33.5 g/dL (ref 30.0–36.0)
MCV: 85.7 fl (ref 78.0–100.0)
MONO ABS: 0.9 10*3/uL (ref 0.1–1.0)
MONOS PCT: 7.3 % (ref 3.0–12.0)
NEUTROS PCT: 69.2 % (ref 43.0–77.0)
Neutro Abs: 8.7 10*3/uL — ABNORMAL HIGH (ref 1.4–7.7)
PLATELETS: 291 10*3/uL (ref 150.0–400.0)
RBC: 4.55 Mil/uL (ref 3.87–5.11)
RDW: 13.6 % (ref 11.5–15.5)
WBC: 12.5 10*3/uL — AB (ref 4.0–10.5)

## 2014-12-04 MED ORDER — VALSARTAN-HYDROCHLOROTHIAZIDE 320-25 MG PO TABS: 1.0000 | ORAL_TABLET | Freq: Every day | ORAL | Status: DC

## 2014-12-04 NOTE — Patient Instructions (Addendum)
Medication Instructions:  Change valsartan hct 160-25mg  to 320-25mg  (still 1 pill) Follow up Monday about Coumadin  Other Instructions:  Check morning home blood pressures twice a week and bring log as well as home cuff to next visit  Labwork: Today before you go I am not really clear why you have anemia, we may do further lab work next visit   Follow-Up: Advise you to call Dr. [name] for R knee follow up  See me 6-8 weeks for blood pressure recheck

## 2014-12-04 NOTE — Progress Notes (Signed)
Pre visit review using our clinic review tool, if applicable. No additional management support is needed unless otherwise documented below in the visit note. 

## 2014-12-04 NOTE — Assessment & Plan Note (Signed)
S: Right knee pain deemed not related to vascular issues. Patient has had an INR up to 10 and required vitamin K after reduction in carbamazepine  A/P: carbamazepine being stopped is likely trigger for increase INR. LFTs normal on check today. INR back into 2s today and has close follow up Monday

## 2014-12-04 NOTE — Assessment & Plan Note (Signed)
S:Valsartan 160-25, compliant but poor control BP Readings from Last 3 Encounters:  12/04/14 140/68  11/26/14 165/82  10/23/14 143/76  home readings always over 140 A/P: increase to Valsartan-hctz 320-25, home monitoring, 6-8 week follow up

## 2014-12-04 NOTE — Progress Notes (Signed)
[name] Reddish, MD  Subjective:  [name] [name] is a 81 y.o. year old very pleasant female patient who presents with:  See problem oriented charting ROS- no chest pain or shortness of breath, nausea or vomiting, continued Right knee pain despite 2 injections- to return to Dr. [name]  Past Medical History- severe PAD, trigeminal neuralgia, HLD, hyperglycemia, HTN, esophageal stricture, COPD  Medications- reviewed and updated Current Outpatient Prescriptions  Medication Sig Dispense Refill  . acetaminophen (TYLENOL) 325 MG tablet Take 325 mg by mouth 2 (two) times daily.    Marland Kitchen aspirin 81 MG tablet Take 81 mg by mouth daily.    . calcium-vitamin D (OSCAL WITH D 500-200) 500-200 MG-UNIT per tablet Take 1 tablet by mouth daily.      . Cholecalciferol (VITAMIN D3) 1000 UNITS CAPS Take 1,000 Units by mouth daily.     Marland Kitchen omeprazole (PRILOSEC) 20 MG capsule Take 20 mg by mouth every morning.     . simvastatin (ZOCOR) 40 MG tablet Take 1 tablet (40 mg total) by mouth daily at 6 PM. 90 tablet 1  . valsartan-hydrochlorothiazide (DIOVAN-HCT) 160-25 MG per tablet Take 1 tablet by mouth daily. 90 tablet 1  . warfarin (COUMADIN) 5 MG tablet Take 2-2.5 tablets (10-12.5 mg total) by mouth See admin instructions. Take 2 and 1/2 tablets on Thursday then take 2 tablets the rest of the days (Patient taking differently: Take 5 mg by mouth See admin instructions. Take as directed) 180 tablet 1  . methocarbamol (ROBAXIN) 500 MG tablet Take 1 tablet (500 mg total) by mouth 3 (three) times daily between meals as needed. (Patient not taking: Reported on 11/26/2014) 20 tablet 0  . traMADol (ULTRAM) 50 MG tablet Take 50 mg by mouth every 6 (six) hours as needed.     Objective: BP 140/68 mmHg  Pulse 87  Temp(Src) 98.3 F (36.8 C)  Wt 175 lb (79.379 kg) Gen: NAD, resting comfortably CV: RRR no murmurs rubs or gallops Lungs: CTAB no crackles, wheeze, rhonchi Abdomen: soft/nontender/nondistended/normal bowel sounds. No  rebound or guarding.  Ext: trace edema, 1+ pulse PT R Skin: warm, dry, no rash Neuro: grossly normal, moves all extremities, appears uncomfortable walking on R knee   Assessment/Plan:  Essential hypertension S:Valsartan 160-25, compliant but poor control BP Readings from Last 3 Encounters:  12/04/14 140/68  11/26/14 165/82  10/23/14 143/76  home readings always over 140 A/P: increase to Valsartan-hctz 320-25, home monitoring, 6-8 week follow up   Peripheral arterial disease S: Right knee pain deemed not related to vascular issues. Patient has had an INR up to 10 and required vitamin K after reduction in carbamazepine  A/P: carbamazepine being stopped is likely trigger for increase INR. LFTs normal on check today. INR back into 2s today and has close follow up Monday   Return precautions advised.   Orders Placed This Encounter  Procedures  . CBC with Differential/Platelet  . Comprehensive metabolic panel        Meds ordered this encounter  Medications  . valsartan-hydrochlorothiazide (DIOVAN-HCT) 320-25 MG per tablet    Sig: Take 1 tablet by mouth daily.    Dispense:  30 tablet    Refill:  5

## 2014-12-05 ENCOUNTER — Ambulatory Visit (INDEPENDENT_AMBULATORY_CARE_PROVIDER_SITE_OTHER): Payer: Medicare Other

## 2014-12-05 DIAGNOSIS — M1711 Unilateral primary osteoarthritis, right knee: Secondary | ICD-10-CM | POA: Diagnosis not present

## 2014-12-05 DIAGNOSIS — J309 Allergic rhinitis, unspecified: Secondary | ICD-10-CM | POA: Diagnosis not present

## 2014-12-08 ENCOUNTER — Ambulatory Visit (INDEPENDENT_AMBULATORY_CARE_PROVIDER_SITE_OTHER): Payer: Medicare Other | Admitting: General Practice

## 2014-12-08 DIAGNOSIS — I749 Embolism and thrombosis of unspecified artery: Secondary | ICD-10-CM

## 2014-12-08 DIAGNOSIS — Z5181 Encounter for therapeutic drug level monitoring: Secondary | ICD-10-CM | POA: Diagnosis not present

## 2014-12-08 LAB — POCT INR: INR: 2.5

## 2014-12-08 NOTE — Progress Notes (Signed)
Pre visit review using our clinic review tool, if applicable. No additional management support is needed unless otherwise documented below in the visit note. 

## 2014-12-11 ENCOUNTER — Encounter: Payer: Self-pay | Admitting: Internal Medicine

## 2014-12-12 ENCOUNTER — Ambulatory Visit (INDEPENDENT_AMBULATORY_CARE_PROVIDER_SITE_OTHER): Payer: Medicare Other

## 2014-12-12 DIAGNOSIS — J309 Allergic rhinitis, unspecified: Secondary | ICD-10-CM

## 2014-12-15 DIAGNOSIS — M1711 Unilateral primary osteoarthritis, right knee: Secondary | ICD-10-CM | POA: Diagnosis not present

## 2014-12-18 ENCOUNTER — Ambulatory Visit (INDEPENDENT_AMBULATORY_CARE_PROVIDER_SITE_OTHER): Payer: Medicare Other | Admitting: Internal Medicine

## 2014-12-18 ENCOUNTER — Ambulatory Visit (INDEPENDENT_AMBULATORY_CARE_PROVIDER_SITE_OTHER): Payer: Medicare Other

## 2014-12-18 ENCOUNTER — Encounter: Payer: Self-pay | Admitting: Internal Medicine

## 2014-12-18 VITALS — BP 148/64 | HR 58 | Ht 60.0 in | Wt 177.0 lb

## 2014-12-18 DIAGNOSIS — J309 Allergic rhinitis, unspecified: Secondary | ICD-10-CM

## 2014-12-18 DIAGNOSIS — R911 Solitary pulmonary nodule: Secondary | ICD-10-CM | POA: Diagnosis not present

## 2014-12-18 DIAGNOSIS — J301 Allergic rhinitis due to pollen: Secondary | ICD-10-CM | POA: Diagnosis not present

## 2014-12-18 DIAGNOSIS — J449 Chronic obstructive pulmonary disease, unspecified: Secondary | ICD-10-CM

## 2014-12-18 DIAGNOSIS — I739 Peripheral vascular disease, unspecified: Secondary | ICD-10-CM | POA: Diagnosis not present

## 2014-12-18 MED ORDER — AZELASTINE-FLUTICASONE 137-50 MCG/ACT NA SUSP: 1.0000 | Freq: Every day | NASAL | Status: DC

## 2014-12-18 NOTE — Patient Instructions (Addendum)
We can continue allergy vaccine  1:10 GH  Sample and script to try Dymista nasal spray    1-2 puffs each nostril once daily at bedtime          See if this helps the watery eyes and nose  Prevnar 13 pneumonia vaccine

## 2014-12-18 NOTE — Progress Notes (Signed)
Patient ID: [name] [name], female    DOB: September 27, 1924, 81 y.o.   MRN: [id number]  HPI 08/02/10-81 yoF former smoker, followed here for allergic rhinitis on allergy vaccine, and COPD. She is bothered on chronic basis by her watery, burning eyes. She just saw Dr Katy Fitch, with all his drops tried. She is worse in past 1-2 months with Spring pollen season. She is resigned to staying this way. Seeing Dr Allyson Sabal for significant eczema of fingertips.  She remains satisfied that the allergy vaccine is helpful and worth continuing.  01/31/11-  81 yoF former smoker, followed here for allergic rhinitis on allergy vaccine, and COPD, lung nodules. She is bothered on chronic basis by her watery, burning eyes. Acute visit-ill for past 2 days. Complains of pressure in her ears, weakness and shakiness, headache, nausea. Eyes always water when she goes outdoors. She denies fever, swollen glands, sore throat, discolored sputum, chest tightness or wheeze. She has been on tramadol for several weeks and asks if that might be what started causing her to feel badly just a few days ago. CT chest 12/07/10-  IMPRESSION:  1. Compared with previous exam the previously referenced pulmonary  nodules in the right middle lobe have not changed in size.  Recommend continued follow-up to two years from initial study  01/15/2010.  Original Report Authenticated By: [name], M.D.   08/15/11- 81 yoF former smoker, followed here for allergic rhinitis on allergy vaccine, and COPD, lung nodules. She is bothered on chronic basis by her watery, burning eyes. Off of allergy vaccine for 6 weeks because of back surgery. Wants to resume and we discussed risk and safety considerations and alternatives. Allegra helps. Previously identified lung nodules, back to 2011. She had chest x-ray in hospital 06/27/2011 with stable nodularity right middle lobe. That completes 2 years of surveillance.  01/12/12- 81 yoF former smoker, followed here for  allergic rhinitis on allergy vaccine, and COPD, lung nodules. 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 Since last here had lumbar spine surgery, doing well. Evaluated at emergency room for vasovagal syncope and had normal stress test by her report, this week. Continues allergy vaccine 1:10 GH. Discussed  supplemental antihistamines. PPD skin test was negative this year. CT-01/04/12-images reviewed with her. She IMPRESSION:  1. While the two right middle lobe nodules noted on prior  examinations are unchanged, there is a new cluster of  peribronchovascular micronodules in the adjacent lateral segment of  the right middle lobe. Overall, this spectrum of findings strongly  favors a benign process (likely infectious disease), possibly  mycobacterial infection. These new nodules are highly unlikely to  be neoplastic, and the old nodules now demonstrate 2 years of  stability and can be considered radiographically benign. No other  suspicious appearing pulmonary nodules or masses are otherwise  identified.  2. Atherosclerosis, including left main and three-vessel coronary  artery disease.  3. Additional incidental findings, as above.  Original Report Authenticated By: [name], M.D.   04/19/12- 81 yoF former smoker, followed here for allergic rhinitis on allergy vaccine, and COPD, lung nodules. FOLLOWS FOR: Pt c/o increased SOB with exertion. Denies cough, wheezing, chest tightness.  She chokes easily on her food, which she finds "aggravating". Otherwise does not usually cough reports no phlegm, fever or sweat. No heartburn or food hanging up. Maybe a little more shortness of breath on exertion over the last  month or 2, climbing stairs. She had a normal stress echocardiogram documented below. She recognized improved stamina when she was going regularly to [employer], but she drifted away from  that over the holidays. Notes Recorded by Josue Hector, MD on 01/10/2012: Normal stress echo CT 04/11/12- IMPRESSION:  1. Findings, as above, remain consistent with a chronic indolent  atypical infectious process, likely a mycobacterial infection. The  cluster of peribronchovascular micronodules in the lateral segment  of the right middle lobe that was new on the prior examination is  persistent, however, the largest nodule within this cluster has  resolved. No findings suspicious for malignancy are seen on  today's examination.  2. Atherosclerosis, including left main and three-vessel coronary  artery disease.  3. Dilatation of the pulmonic trunk (3.8 cm in diameter), which  may suggest pulmonary arterial hypertension.  4. Additional incidental findings, as above.  Original Report Authenticated By: [name], M.D.  PFT: 05/24/2011 moderate obstructive airways disease with response to bronchodilator, air trapping, diffusion moderately reduced. FEV1 1.12/75%, FEV1/FVC 0.60, FEF 25-75% 0.47/27%. TLC 117% RV 168%, DLCO 57%.  10/18/12- 81 yoF former smoker, followed here for allergic rhinitis on allergy vaccine, and COPD, lung nodules/ ?MAIC FOLLOWS FOR: SOB on days with bad humidity; still on vaccine and doing well. Continues allergy vaccine 1:10 GH This has not been a bad pollen season. No cough. CT chest 10/04/12 IMPRESSION:  1. Dominant right middle lobe nodule is unchanged from 01/15/2010  and is therefore considered benign.  2. Additional scattered micro nodularity has a benign or post  infectious appearance.  3. Borderline pulmonary arterial enlargement can be seen with  pulmonary arterial hypertension.  Original Report Authenticated By: [name], M.D.  12/16/13- 81 yoF former smoker, followed here for allergic rhinitis on allergy vaccine, and COPD, lung nodules/ ?MAIC FOLLOWS DEY:CXKGY on  Allergy vaccine 1:10 GH and doing well.  12/18/14- 81 yoF former smoker,  followed here for allergic rhinitis on allergy vaccine, and COPD, lung nodules/ ?MAIC FOLLOWS FOR: Pt states she is doing well. Pt still c/o teary eyes and mild rhinorrhea. Pt states she is still on allergy vaccine 1:10 GH and doing well. She recognized increasing symptoms when she dropped off allergy shots because of a leg problem limiting transportation. Perennial sniffing and nasal drip not helped by Claritin or Zyrtec. No coughing and no shortness of breath.  Review of Systems-See HPI Constitutional:   No-   weight loss, night sweats, fevers, chills,+ fatigue, lassitude. HEENT:   No-  headaches, difficulty swallowing, tooth/dental problems, sore throat,       No-  sneezing, itching, ear ache, nasal congestion, + post nasal drip,  CV:  No-   chest pain, orthopnea, PND, swelling in lower extremities, anasarca, dizziness, palpitations Resp: +shortness of breath with exertion or at rest.              No-   productive cough,  No non-productive cough,  No-  coughing up of blood.              No-   change in color of mucus.  No- wheezing.   Skin: No-   rash or lesions. GI:  No-   heartburn, indigestion, abdominal pain, + nausea, GU: . MS:  No-   joint pain or swelling.  Neuro- no new process Psych:  No- change in mood or affect. No depression or anxiety.  No memory loss.    Objective:   Physical Exam General- Alert, Oriented,  Affect-appropriate, Distress- none acute Skin- rash-none, lesions- none, excoriation- none Lymphadenopathy- none Head- atraumatic            Eyes- Gross vision intact, PERRLA, conjunctivae clear secretions            Ears- Hearing, canals-normal            Nose- +sniffing, no-Septal dev, mucus, polyps, erosion, perforation             Throat- Mallampati IV , mucosa clear , drainage- none, tonsils- atrophic Neck- flexible , trachea midline, no stridor , thyroid nl, carotid no bruit Chest - symmetrical excursion , unlabored           Heart/CV- RRR , no murmur , no  gallop  , no rub, nl s1 s2                           - JVD- none , edema- none, stasis changes- none, varices- none           Lung- clear to P&A, wheeze- none, cough- none , dullness-none, rub- none           Chest wall-  Abd-  Br/ Gen/ Rectal- Not done, not indicated Extrem- cyanosis- none, clubbing, none, atrophy- none, strength- nl Neuro- grossly intact to observation; mildly tremulous

## 2014-12-19 ENCOUNTER — Ambulatory Visit: Payer: Medicare Other

## 2014-12-22 ENCOUNTER — Ambulatory Visit (INDEPENDENT_AMBULATORY_CARE_PROVIDER_SITE_OTHER): Payer: Medicare Other | Admitting: General Practice

## 2014-12-22 DIAGNOSIS — I749 Embolism and thrombosis of unspecified artery: Secondary | ICD-10-CM | POA: Diagnosis not present

## 2014-12-22 DIAGNOSIS — Z5181 Encounter for therapeutic drug level monitoring: Secondary | ICD-10-CM | POA: Diagnosis not present

## 2014-12-22 LAB — POCT INR: INR: 3.8

## 2014-12-22 NOTE — Progress Notes (Signed)
Pre visit review using our clinic review tool, if applicable. No additional management support is needed unless otherwise documented below in the visit note. 

## 2014-12-25 ENCOUNTER — Ambulatory Visit: Payer: Medicare Other | Admitting: Vascular Surgery

## 2014-12-25 ENCOUNTER — Ambulatory Visit (HOSPITAL_COMMUNITY)
Admission: RE | Admit: 2014-12-25 | Discharge: 2014-12-25 | Disposition: A | Discharge status: Home / Self Care | Payer: Medicare Other | Source: Ambulatory Visit | Attending: Vascular Surgery | Admitting: Vascular Surgery

## 2014-12-25 ENCOUNTER — Other Ambulatory Visit: Payer: Self-pay | Admitting: Vascular Surgery

## 2014-12-25 ENCOUNTER — Ambulatory Visit (INDEPENDENT_AMBULATORY_CARE_PROVIDER_SITE_OTHER)
Admission: RE | Admit: 2014-12-25 | Discharge: 2014-12-25 | Disposition: A | Discharge status: Home / Self Care | Payer: Medicare Other | Source: Ambulatory Visit | Attending: Vascular Surgery | Admitting: Vascular Surgery

## 2014-12-25 DIAGNOSIS — E785 Hyperlipidemia, unspecified: Secondary | ICD-10-CM | POA: Diagnosis not present

## 2014-12-25 DIAGNOSIS — I10 Essential (primary) hypertension: Secondary | ICD-10-CM | POA: Insufficient documentation

## 2014-12-25 DIAGNOSIS — I739 Peripheral vascular disease, unspecified: Secondary | ICD-10-CM

## 2014-12-26 ENCOUNTER — Ambulatory Visit (INDEPENDENT_AMBULATORY_CARE_PROVIDER_SITE_OTHER): Payer: Medicare Other

## 2014-12-26 DIAGNOSIS — Z23 Encounter for immunization: Secondary | ICD-10-CM | POA: Diagnosis not present

## 2014-12-30 ENCOUNTER — Encounter: Payer: Self-pay | Admitting: Vascular Surgery

## 2014-12-31 ENCOUNTER — Ambulatory Visit (INDEPENDENT_AMBULATORY_CARE_PROVIDER_SITE_OTHER): Payer: Medicare Other | Admitting: Vascular Surgery

## 2014-12-31 ENCOUNTER — Encounter: Payer: Self-pay | Admitting: Vascular Surgery

## 2014-12-31 VITALS — BP 122/71 | HR 63 | Temp 97.5°F | Resp 16 | Ht 61.5 in | Wt 174.0 lb

## 2014-12-31 DIAGNOSIS — I739 Peripheral vascular disease, unspecified: Secondary | ICD-10-CM | POA: Diagnosis not present

## 2014-12-31 NOTE — Progress Notes (Signed)
  Physical exam:   Filed Vitals:   12/31/14 1127  BP: 122/71  Pulse: 63  Temp: 97.5 F (36.4 C)  TempSrc: Oral  Resp: 16  Height: 5' 1.5" (1.562 m)  Weight: 174 lb (78.926 kg)  SpO2: 96%    Bilateral lower extremities one to 2 plus DP pulses bilaterally  Data: I reviewed the patient's ABIs dated February 18. This showed an ABI on the right of 0.89 left 0.90.  She did have some slightly increased velocities at the proximal inflow artery but the waveforms were triphasic at this level and we opted for observation rather than an intervention. ABIs on April 10 were 0.8 bilaterally. Waveforms were triphasic to biphasic. Arterial duplex showed a widely patent bypass graft.  Repeat ABIs in graft duplex from September 1 again showed similar findings velocities at the proximal anastomosis on the right side are less than 300. ABIs on the right 0.93 left 0.97  Assessment:  right knee pain I am unsure of the etiology of this but it is improved. Mild narrowing right proximal anastomosis  Patient will follow-up with me in 6 months time. We will continue to observe the narrowing of her proximal anastomosis for now unless she has symptoms consistent with arterial occlusive disease suspicious calf claudication or nonhealing wounds.  [name], MD [facility] Office: [phone] Pager: [phone]

## 2014-12-31 NOTE — Addendum Note (Signed)
Addended by: Dorthula Rue L on: 12/31/2014 04:47 PM   Modules accepted: Orders

## 2015-01-02 ENCOUNTER — Ambulatory Visit (INDEPENDENT_AMBULATORY_CARE_PROVIDER_SITE_OTHER): Payer: Medicare Other

## 2015-01-02 DIAGNOSIS — J309 Allergic rhinitis, unspecified: Secondary | ICD-10-CM

## 2015-01-03 NOTE — Assessment & Plan Note (Signed)
Nodule benign as of CT 10/01/2012

## 2015-01-03 NOTE — Assessment & Plan Note (Signed)
She is satisfied that she is better off on on allergy vaccine. We discussed antihistamines and nasal sprays.

## 2015-01-03 NOTE — Assessment & Plan Note (Signed)
No recent exacerbation through summer months. Reminded to get flu shot in the fall.

## 2015-01-05 ENCOUNTER — Ambulatory Visit (INDEPENDENT_AMBULATORY_CARE_PROVIDER_SITE_OTHER): Payer: Medicare Other | Admitting: General Practice

## 2015-01-05 DIAGNOSIS — I749 Embolism and thrombosis of unspecified artery: Secondary | ICD-10-CM | POA: Diagnosis not present

## 2015-01-05 DIAGNOSIS — Z5181 Encounter for therapeutic drug level monitoring: Secondary | ICD-10-CM

## 2015-01-05 LAB — POCT INR: INR: 4.7

## 2015-01-05 NOTE — Progress Notes (Signed)
Pre visit review using our clinic review tool, if applicable. No additional management support is needed unless otherwise documented below in the visit note. 

## 2015-01-09 ENCOUNTER — Ambulatory Visit (INDEPENDENT_AMBULATORY_CARE_PROVIDER_SITE_OTHER): Payer: Medicare Other

## 2015-01-09 DIAGNOSIS — J309 Allergic rhinitis, unspecified: Secondary | ICD-10-CM

## 2015-01-12 ENCOUNTER — Ambulatory Visit (INDEPENDENT_AMBULATORY_CARE_PROVIDER_SITE_OTHER): Payer: Medicare Other | Admitting: Adult Health

## 2015-01-12 ENCOUNTER — Encounter: Payer: Self-pay | Admitting: Adult Health

## 2015-01-12 VITALS — BP 138/60 | HR 72 | Resp 20 | Ht 62.0 in | Wt 177.0 lb

## 2015-01-12 DIAGNOSIS — G500 Trigeminal neuralgia: Secondary | ICD-10-CM

## 2015-01-12 DIAGNOSIS — I739 Peripheral vascular disease, unspecified: Secondary | ICD-10-CM | POA: Diagnosis not present

## 2015-01-12 MED ORDER — GABAPENTIN 100 MG PO CAPS: 100.0000 mg | ORAL_CAPSULE | Freq: Every day | ORAL | Status: DC

## 2015-01-12 NOTE — Patient Instructions (Signed)
Begin Gabapentin 100 mg at bedtime.  If your symptoms worsen or you develop new symptoms please let us know.   Gabapentin capsules or tablets What is this medicine? GABAPENTIN (GA ba pen tin) is used to control partial seizures in adults with epilepsy. It is also used to treat certain types of nerve pain. This medicine may be used for other purposes; ask your health care provider or pharmacist if you have questions. COMMON BRAND NAME(S): Orpha Bur, Neurontin What should I tell my health care provider before I take this medicine? They need to know if you have any of these conditions: -kidney disease -suicidal thoughts, plans, or attempt; a previous suicide attempt by you or a family member -an unusual or allergic reaction to gabapentin, other medicines, foods, dyes, or preservatives -pregnant or trying to get pregnant -breast-feeding How should I use this medicine? Take this medicine by mouth with a glass of water. Follow the directions on the prescription label. You can take it with or without food. If it upsets your stomach, take it with food.Take your medicine at regular intervals. Do not take it more often than directed. Do not stop taking except on your doctor's advice. If you are directed to break the 600 or 800 mg tablets in half as part of your dose, the extra half tablet should be used for the next dose. If you have not used the extra half tablet within 28 days, it should be thrown away. A special MedGuide will be given to you by the pharmacist with each prescription and refill. Be sure to read this information carefully each time. Talk to your pediatrician regarding the use of this medicine in children. Special care may be needed. Overdosage: If you think you have taken too much of this medicine contact a poison control center or emergency room at once. NOTE: This medicine is only for you. Do not share this medicine with others. What if I miss a dose? If you miss a dose, take it as  soon as you can. If it is almost time for your next dose, take only that dose. Do not take double or extra doses. What may interact with this medicine? Do not take this medicine with any of the following medications: -other gabapentin products This medicine may also interact with the following medications: -alcohol -antacids -antihistamines for allergy, cough and cold -certain medicines for anxiety or sleep -certain medicines for depression or psychotic disturbances -homatropine; hydrocodone -naproxen -narcotic medicines (opiates) for pain -phenothiazines like chlorpromazine, mesoridazine, prochlorperazine, thioridazine This list may not describe all possible interactions. Give your health care provider a list of all the medicines, herbs, non-prescription drugs, or dietary supplements you use. Also tell them if you smoke, drink alcohol, or use illegal drugs. Some items may interact with your medicine. What should I watch for while using this medicine? Visit your doctor or health care professional for regular checks on your progress. You may want to keep a record at home of how you feel your condition is responding to treatment. You may want to share this information with your doctor or health care professional at each visit. You should contact your doctor or health care professional if your seizures get worse or if you have any new types of seizures. Do not stop taking this medicine or any of your seizure medicines unless instructed by your doctor or health care professional. Stopping your medicine suddenly can increase your seizures or their severity. Wear a medical identification bracelet or chain if you are  taking this medicine for seizures, and carry a card that lists all your medications. You may get drowsy, dizzy, or have blurred vision. Do not drive, use machinery, or do anything that needs mental alertness until you know how this medicine affects you. To reduce dizzy or fainting spells, do  not sit or stand up quickly, especially if you are an older patient. Alcohol can increase drowsiness and dizziness. Avoid alcoholic drinks. Your mouth may get dry. Chewing sugarless gum or sucking hard candy, and drinking plenty of water will help. The use of this medicine may increase the chance of suicidal thoughts or actions. Pay special attention to how you are responding while on this medicine. Any worsening of mood, or thoughts of suicide or dying should be reported to your health care professional right away. Women who become pregnant while using this medicine may enroll in the [place] Pregnancy Registry by calling [phone]. This registry collects information about the safety of antiepileptic drug use during pregnancy. What side effects may I notice from receiving this medicine? Side effects that you should report to your doctor or health care professional as soon as possible: -allergic reactions like skin rash, itching or hives, swelling of the face, lips, or tongue -worsening of mood, thoughts or actions of suicide or dying Side effects that usually do not require medical attention (report to your doctor or health care professional if they continue or are bothersome): -constipation -difficulty walking or controlling muscle movements -dizziness -nausea -slurred speech -tiredness -tremors -weight gain This list may not describe all possible side effects. Call your doctor for medical advice about side effects. You may report side effects to FDA at 1-800-FDA-1088. Where should I keep my medicine? Keep out of reach of children. Store at room temperature between 15 and 30 degrees C (59 and 86 degrees F). Throw away any unused medicine after the expiration date. NOTE: This sheet is a summary. It may not cover all possible information. If you have questions about this medicine, talk to your doctor, pharmacist, or health care provider.  2015, [employer]. (2012-12-13 09:12:48)

## 2015-01-12 NOTE — Progress Notes (Addendum)
PATIENT: [name] [name] DOB: 30-Sep-1924  REASON FOR VISIT: follow up-trigeminal neuralgia HISTORY FROM: patient  HISTORY OF PRESENT ILLNESS: [name] [name] is an 81 year old female with a history of trigeminal neuralgia. She returns today for follow-up. The patient states that she ran out of medication about 1 month ago. She was unable to get a refill therefore she tried to go without the medication. She states that the pain on the left side of face has returned. She states that it occurs daily however the pain is not severe like it was in the past. The patient reports that she is also on Coumadin for DVTs. She states that over the last year she has had a hard time keeping her INR between 2.5-3.5. She is questioning whether she can go back on carbamazepine or another medication. She returns today for an evaluation.  HISTORY  01/09/14: [name] [name] is an 81 year old female with a history of trigeminal neuralgia. She returns today for follow-up. She currently taking tegretol and is tolerating it well. She reports that her pain has been under control. She states that her only complaint is that it makes her sleepy. She confirms that she only takes the medication at night. She does have some back pain due to arthritis and takes tylenol for that. She is on coumadin for DVTs. Otherwise the patient is doing well.   HISTORY 01/03/13 (CD): 81 y.o. female Is seen here as a referral/ revisit from Dr. Arnoldo Morale for the visit. I had seen the patient last in 2012 when she was followed here for her trigeminal neuralgia. She had reported at that time occupational reefed discomfort when chewing. She did have an emergency room visit for a fainting spell she fell from the commode. The patient also underwent multiple back surgeries in spring of last year and spent afterwards 3 weeks in [facility] for rehabilitation. The patient has in the past had operation for a bypass of the femoral popliteal artery.  She was  temporarily on oxycodone for the treatment of back pain which she has neither enjoyed nor maintained.  The patient has been a member in the [employer] program until her 13 year old sister fell and fractured her spine and hip . 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  She still works 2 days a week and volunteers. She had no recent falls, no balance issues, no headaches. She has occasionally noted a tremor in her right hand which is still there.    REVIEW OF SYSTEMS: Out of a complete 14 system review of symptoms, the patient complains only of the following symptoms, and all other reviewed systems are negative.  Restless leg  ALLERGIES: Allergies  Allergen Reactions  . Sulfa Antibiotics Other (See Comments)    Cold sweat light headed and disorientation  . Tiotropium Bromide Shortness Of Breath and Other (See Comments)    Sore throat also  . Latex Itching and Rash    HOME MEDICATIONS: Outpatient Prescriptions Prior to Visit  Medication Sig Dispense Refill  . acetaminophen (TYLENOL) 325 MG tablet Take 325 mg by mouth as needed.     Marland Kitchen aspirin 81 MG tablet Take 81 mg by mouth daily.    . calcium-vitamin D (OSCAL WITH D 500-200) 500-200 MG-UNIT per tablet Take 1 tablet by mouth daily.      . Cholecalciferol (VITAMIN D3) 1000 UNITS CAPS Take 1,000 Units by mouth daily.     Marland Kitchen glucosamine-chondroitin 500-400 MG tablet Take 2 tablets by  mouth daily.    . NONFORMULARY OR COMPOUNDED ITEM Allergy Vaccine 1:10 Given at [facility]    . omeprazole (PRILOSEC) 20 MG capsule Take 20 mg by mouth every morning.     . simvastatin (ZOCOR) 40 MG tablet Take 1 tablet (40 mg total) by mouth daily at 6 PM. 90 tablet 1  . valsartan-hydrochlorothiazide (DIOVAN-HCT) 320-25 MG per tablet Take 1 tablet by mouth daily. 30 tablet 5  . warfarin (COUMADIN) 5 MG tablet Take 2-2.5 tablets (10-12.5 mg total) by mouth See admin instructions. Take 2 and 1/2 tablets on Thursday then take 2 tablets the  rest of the days (Patient taking differently: Take 5 mg by mouth See admin instructions. Take as directed) 180 tablet 1  . Azelastine-Fluticasone (DYMISTA) 137-50 MCG/ACT SUSP Place 1-2 puffs into the nose at bedtime. (Patient not taking: Reported on 01/12/2015) 1 Bottle prn  . traMADol (ULTRAM) 50 MG tablet Take 50 mg by mouth as needed.      No facility-administered medications prior to visit.    PAST MEDICAL HISTORY: Past Medical History  Diagnosis Date  . GERD (gastroesophageal reflux disease)   . Hyperlipidemia   . History of colonic polyps   . Diverticulosis   . Shingles   . Eczema   . Allergic rhinoconjunctivitis   . Lung nodule 2011  . Environmental allergies     allergy shot- q friday in Dr. Janee Morn office. PFT's abnormal- recommended Spiriva to use preop & will d/c after surgery  . Arthritis     low back , stenosis  . Anxiety     pt. managed- uses deep breathing   . Stenosis of popliteal artery     blood clots in legs long ago    . COPD (chronic obstructive pulmonary disease)   . H/O hiatal hernia   . Hypertension   . Trigeminal neuralgia   . Peripheral vascular disease   . DVT (deep venous thrombosis)   . COLONIC POLYPS, RECURRENT 08/29/2006    2008 last colonoscopy. No further colonoscopy.       PAST SURGICAL HISTORY: Past Surgical History  Procedure Laterality Date  . Tubal ligation    . Cholecystectomy  1998  . Femoral-popliteal bypass graft         x2 surgeries 1990's & 2009  . Eye surgery      cataracts removed- bilateral /w IOL  . Tonsillectomy      as a teenager   . Lumbar fusion  07/06/2011  . Abdominal aortagram N/A 06/17/2011    Procedure: ABDOMINAL Maxcine Ham;  Surgeon: Elam Dutch, MD;  Location: [facility] CATH LAB;  Service: Cardiovascular;  Laterality: N/A;  . Injection knee Right Aug. 2016    Gel injection for pain    FAMILY HISTORY: Family History  Problem Relation Age of Onset  . Arthritis Mother   . Hypertension Mother   . Heart disease  Father   . Colon polyps Father   . Breast cancer Sister   . Lung cancer Sister   . Colon cancer Sister   . Cancer Sister     Breast  . Lung cancer Brother   . Cancer Brother     Lung  . Anesthesia problems Neg Hx   . Hypotension Neg Hx   . Malignant hyperthermia Neg Hx   . Pseudochol deficiency Neg Hx   . Cancer Daughter     Breast  . Hypertension Son     SOCIAL HISTORY: Social History   Social History  .  Marital Status: Single    Spouse Name: N/A  . Number of Children: 4  . Years of Education: 12   Occupational History  .     Social History Main Topics  . Smoking status: Former Smoker -- 2.00 packs/day for 30 years    Types: Cigarettes    Quit date: 06/18/1993  . Smokeless tobacco: Never Used     Comment: QUIT IN 1995  . Alcohol Use: No  . Drug Use: No  . Sexual Activity: Not on file   Other Topics Concern  . Not on file   Social History Narrative   Patient is widowed with 4 children. 2 grandkids. Lives alone.    Patient is right handed.   Patient has high school education.   Patient drinks 1 cup daily.      Retired from [employer] for 28 years, works 2 days a week until 2016.       Hobbies: volunteers at [employer], [place] from church visits people.             PHYSICAL EXAM  Filed Vitals:   01/12/15 0921  BP: 138/60  Pulse: 72  Resp: 20  Height: 5\' 2"  (1.575 m)  Weight: 177 lb (80.287 kg)   Body mass index is 32.37 kg/(m^2).  Generalized: Well developed, in no acute distress   Neurological examination  Mentation: Alert oriented to time, place, history taking. Follows all commands speech and language fluent Cranial nerve II-XII: Pupils were equal round reactive to light. Extraocular movements were full, visual field were full on confrontational test. Facial sensation and strength were normal. Uvula tongue midline. Head turning and shoulder shrug  were normal and symmetric. Motor: The motor testing reveals 5 over 5 strength  of all 4 extremities. Good symmetric motor tone is noted throughout.  Sensory: Sensory testing is intact to soft touch on all 4 extremities. No evidence of extinction is noted.  Coordination: Cerebellar testing reveals good finger-nose-finger and heel-to-shin bilaterally.  Gait and station: Gait is normal. Tandem gait is normal. Romberg is negative. No drift is seen.  Reflexes: Deep tendon reflexes are symmetric and normal bilaterally.   DIAGNOSTIC DATA (LABS, IMAGING, TESTING) - I reviewed patient records, labs, notes, testing and imaging myself where available.  Lab Results  Component Value Date   WBC 12.5* 12/04/2014   HGB 13.1 12/04/2014   HCT 39.0 12/04/2014   MCV 85.7 12/04/2014   PLT 291.0 12/04/2014      Component Value Date/Time   NA 139 12/04/2014 1023   K 4.9 12/04/2014 1023   CL 103 12/04/2014 1023   CO2 28 12/04/2014 1023   GLUCOSE 129* 12/04/2014 1023   BUN 30* 12/04/2014 1023   CREATININE 1.15 12/04/2014 1023   CALCIUM 9.8 12/04/2014 1023   PROT 7.3 12/04/2014 1023   ALBUMIN 4.3 12/04/2014 1023   AST 15 12/04/2014 1023   ALT 12 12/04/2014 1023   ALKPHOS 80 12/04/2014 1023   BILITOT 0.3 12/04/2014 1023   GFRNONAA 60* 10/14/2013 0500   GFRAA 75* 10/14/2013 0500    ASSESSMENT AND PLAN 81 y.o. year old female  has a past medical history of GERD (gastroesophageal reflux disease); Hyperlipidemia; History of colonic polyps; Diverticulosis; Shingles; Eczema; Allergic rhinoconjunctivitis; Lung nodule (2011); Environmental allergies; Arthritis; Anxiety; Stenosis of popliteal artery; COPD (chronic obstructive pulmonary disease); H/O hiatal hernia; Hypertension; Trigeminal neuralgia; Peripheral vascular disease; DVT (deep venous thrombosis); and COLONIC POLYPS, RECURRENT (08/29/2006). here with:  1. Trigeminal neuralgia  The patient  has begin to have pain in the left side of face after stopping Carbatrol. The patient is also on Coumadin. Carbatrol can decrease INR levels.  The patient also reports that over the last year she has had a hard time keeping her INR levels between 2.5 and 3.5. Instead of restarting Carbatrol we will try the patient on a low-dose of gabapentin. Gabapentin does not interact with Coumadin. She will begin on 100 mg at bedtime. Patient advised that if her symptoms do not improve she we'll let us know. She will follow-up in 3-4 months or sooner if needed.  Ward Givens, MSN, NP-C 01/12/2015, 9:40 AM [facility] [address], [place], [place] [address] [phone]  Personally  participated in and made any corrections needed to history, physical, neuro exam,assessment and plan as stated above.  Sarina Ill, MD [facility]

## 2015-01-16 ENCOUNTER — Encounter: Payer: Self-pay | Admitting: *Deleted

## 2015-01-16 ENCOUNTER — Ambulatory Visit: Payer: Medicare Other

## 2015-01-16 ENCOUNTER — Ambulatory Visit (INDEPENDENT_AMBULATORY_CARE_PROVIDER_SITE_OTHER): Payer: Medicare Other

## 2015-01-16 DIAGNOSIS — J309 Allergic rhinitis, unspecified: Secondary | ICD-10-CM

## 2015-01-19 ENCOUNTER — Ambulatory Visit (INDEPENDENT_AMBULATORY_CARE_PROVIDER_SITE_OTHER): Payer: Medicare Other | Admitting: General Practice

## 2015-01-19 DIAGNOSIS — I749 Embolism and thrombosis of unspecified artery: Secondary | ICD-10-CM

## 2015-01-19 DIAGNOSIS — Z5181 Encounter for therapeutic drug level monitoring: Secondary | ICD-10-CM | POA: Diagnosis not present

## 2015-01-19 LAB — POCT INR: INR: 3.1

## 2015-01-19 NOTE — Progress Notes (Signed)
Agree with coumadin management. 

## 2015-01-19 NOTE — Progress Notes (Signed)
Pre visit review using our clinic review tool, if applicable. No additional management support is needed unless otherwise documented below in the visit note. 

## 2015-01-23 ENCOUNTER — Ambulatory Visit (INDEPENDENT_AMBULATORY_CARE_PROVIDER_SITE_OTHER): Payer: Medicare Other

## 2015-01-23 DIAGNOSIS — J309 Allergic rhinitis, unspecified: Secondary | ICD-10-CM

## 2015-01-30 ENCOUNTER — Ambulatory Visit (INDEPENDENT_AMBULATORY_CARE_PROVIDER_SITE_OTHER): Payer: Medicare Other

## 2015-01-30 DIAGNOSIS — J309 Allergic rhinitis, unspecified: Secondary | ICD-10-CM | POA: Diagnosis not present

## 2015-02-06 ENCOUNTER — Ambulatory Visit (INDEPENDENT_AMBULATORY_CARE_PROVIDER_SITE_OTHER): Payer: Medicare Other

## 2015-02-06 DIAGNOSIS — J309 Allergic rhinitis, unspecified: Secondary | ICD-10-CM

## 2015-02-09 ENCOUNTER — Ambulatory Visit (INDEPENDENT_AMBULATORY_CARE_PROVIDER_SITE_OTHER): Payer: Medicare Other | Admitting: General Practice

## 2015-02-09 ENCOUNTER — Other Ambulatory Visit: Payer: Self-pay | Admitting: Family Medicine

## 2015-02-09 DIAGNOSIS — Z5181 Encounter for therapeutic drug level monitoring: Secondary | ICD-10-CM

## 2015-02-09 DIAGNOSIS — I749 Embolism and thrombosis of unspecified artery: Secondary | ICD-10-CM | POA: Diagnosis not present

## 2015-02-09 LAB — POCT INR: INR: 3.1

## 2015-02-09 NOTE — Progress Notes (Signed)
Pre visit review using our clinic review tool, if applicable. No additional management support is needed unless otherwise documented below in the visit note. 

## 2015-02-12 ENCOUNTER — Encounter: Payer: Self-pay | Admitting: Family Medicine

## 2015-02-12 ENCOUNTER — Ambulatory Visit (INDEPENDENT_AMBULATORY_CARE_PROVIDER_SITE_OTHER): Payer: Medicare Other | Admitting: Family Medicine

## 2015-02-12 VITALS — BP 130/58 | HR 82 | Temp 98.4°F | Wt 171.0 lb

## 2015-02-12 DIAGNOSIS — M25561 Pain in right knee: Secondary | ICD-10-CM

## 2015-02-12 DIAGNOSIS — I10 Essential (primary) hypertension: Secondary | ICD-10-CM | POA: Diagnosis not present

## 2015-02-12 NOTE — Progress Notes (Signed)
[name], MD  Subjective:  [name] [name] is a 81 y.o. year old very pleasant female patient who presents for/with See problem oriented charting ROS- no chest pain or shortness of breath. Not able to exert herself given knee pain to know if dyspnea on exertion  Past Medical History-  Patient Active Problem List   Diagnosis Date Noted  . Pain in joint, lower leg 08/28/2014    Priority: High  . Peripheral arterial disease ([place]) 06/29/2007    Priority: High  . Trigeminal neuralgia     Priority: Medium  . COPD mixed type ([place]) 01/22/2010    Priority: Medium  . Hyperglycemia 01/06/2009    Priority: Medium  . Hyperlipidemia 10/23/2006    Priority: Medium  . Essential hypertension 10/23/2006    Priority: Medium  . ESOPHAGEAL STRICTURE 08/09/1993    Priority: Medium  . Encounter for therapeutic drug monitoring 10/21/2013    Priority: Low  . Syncope 12/29/2011    Priority: Low  . Allergic rhinitis due to pollen 06/25/2010    Priority: Low  . Solitary pulmonary nodule 01/12/2010    Priority: Low  . Osteopenia 01/06/2009    Priority: Low  . ECZEMA 11/26/2007    Priority: Low    Medications- reviewed and updated Current Outpatient Prescriptions  Medication Sig Dispense Refill  . aspirin 81 MG tablet Take 81 mg by mouth daily.    . Azelastine-Fluticasone (DYMISTA) 137-50 MCG/ACT SUSP Place 1-2 puffs into the nose at bedtime. 1 Bottle prn  . calcium-vitamin D (OSCAL WITH D 500-200) 500-200 MG-UNIT per tablet Take 1 tablet by mouth daily.      . Cholecalciferol (VITAMIN D3) 1000 UNITS CAPS Take 1,000 Units by mouth daily.     Marland Kitchen gabapentin (NEURONTIN) 100 MG capsule Take 1 capsule (100 mg total) by mouth at bedtime. 30 capsule 3  . glucosamine-chondroitin 500-400 MG tablet Take 2 tablets by mouth daily.    . NONFORMULARY OR COMPOUNDED ITEM Allergy Vaccine 1:10 Given at [facility]    . omeprazole (PRILOSEC) 20 MG capsule Take 20 mg by mouth every morning.     .  simvastatin (ZOCOR) 40 MG tablet TAKE 1 TABLET (40 MG TOTAL) BY MOUTH DAILY AT 6 PM. 90 tablet 3  . valsartan-hydrochlorothiazide (DIOVAN-HCT) 320-25 MG per tablet Take 1 tablet by mouth daily. 30 tablet 5  . warfarin (COUMADIN) 5 MG tablet Take 2-2.5 tablets (10-12.5 mg total) by mouth See admin instructions. Take 2 and 1/2 tablets on Thursday then take 2 tablets the rest of the days (Patient taking differently: Take 5 mg by mouth See admin instructions. Take as directed) 180 tablet 1  . acetaminophen (TYLENOL) 325 MG tablet Take 325 mg by mouth as needed.     . traMADol (ULTRAM) 50 MG tablet Take 50 mg by mouth as needed.      No current facility-administered medications for this visit.    Objective: BP 130/58 mmHg  Pulse 82  Temp(Src) 98.4 F (36.9 C)  Wt 171 lb (77.565 kg) Gen: NAD, resting comfortably CV: RRR no murmurs rubs or gallops Lungs: CTAB no crackles, wheeze, rhonchi Abdomen: soft/nontender/nondistended/normal bowel sounds. No rebound or guarding.  Ext: no edema Knee: no effusion, ACL + MCL + PCL + LCl all intact with no pain when stressed. Patient does have pain with palpation on medial side of knee just above joint line in quadricep Skin: warm, dry, no rash or erythema over knee Neuro: grossly normal, moves all extremities  Assessment/Plan:  Pain in joint, lower leg S: Long discussion today. Patient was having anterior sharp pain which improved after synvisc injection but continues to have medial pain just above the knee which is causing severe pain at times and down to 3-4/10 pain with tramadol. She states she simply cannot take the pain but on other hand tramadol makes her nervous/jumpy/and makes her stay awake most of night.  Months of  Issues now and she ahs grown very discouraged. Pain always worsens with walkign but can hurt her at rest as well.  A/P: patient does not want to take tramadol due to side effets. Told patient did not think ideal to go to stronger  narcotics. Avoid nsaids on coumadin. I advised 500-100mg  TID tylenol and to add biofreeze or capsaicin a sa distractor. Goal to get pain down to 5/10 and make it tolerable but discussed would not likely go away with this. Patient to trial this for a week and if not adequate- would consider adding topical lidocaine patch. Also advised icing at times bothers her the most. I also advised patient to return to Dr. [name]. Possible pain could be in muscle groups and considered ultrasound through Dr. Tamala Julian but want her to follow up with Dr. [name] first- they may be able to do ultrasound at his office.    Essential hypertension S: Home readings stared out 138-153 before change and now are into 120s and 130s with rare reading at or above 140. Controlled in office too BP Readings from Last 3 Encounters:  02/12/15 130/58  01/12/15 138/60  12/31/14 122/71  A/P: improved with increase to max dose Valsartan-hctz 320-25 . Continue current rx.    No avs as internet down.    >50% of 30 minute office visit was spent on counseling(discussing need and importance of avoiding stronger narcotics, discussing potential treatments, consoling patient as pain has been very stressful on her) and coordination of care

## 2015-02-12 NOTE — Assessment & Plan Note (Signed)
S: Home readings stared out 138-153 before change and now are into 120s and 130s with rare reading at or above 140. Controlled in office too BP Readings from Last 3 Encounters:  02/12/15 130/58  01/12/15 138/60  12/31/14 122/71  A/P: improved with increase to max dose Valsartan-hctz 320-25 . Continue current rx.

## 2015-02-12 NOTE — Patient Instructions (Signed)
No avs as internet down.

## 2015-02-12 NOTE — Assessment & Plan Note (Addendum)
S: Long discussion today. Patient was having anterior sharp pain which improved after synvisc injection but continues to have medial pain just above the knee which is causing severe pain at times and down to 3-4/10 pain with tramadol. She states she simply cannot take the pain but on other hand tramadol makes her nervous/jumpy/and makes her stay awake most of night.  Months of  Issues now and she ahs grown very discouraged. Pain always worsens with walkign but can hurt her at rest as well.  A/P: patient does not want to take tramadol due to side effets. Told patient did not think ideal to go to stronger narcotics. Avoid nsaids on coumadin. I advised 500-100mg  TID tylenol and to add biofreeze or capsaicin a sa distractor. Goal to get pain down to 5/10 and make it tolerable but discussed would not likely go away with this. Patient to trial this for a week and if not adequate- would consider adding topical lidocaine patch. Also advised icing at times bothers her the most. I also advised patient to return to Dr. [name]. Possible pain could be in muscle groups and considered ultrasound through Dr. Tamala Julian but want her to follow up with Dr. [name] first- they may be able to do ultrasound at his office.

## 2015-02-13 ENCOUNTER — Ambulatory Visit (INDEPENDENT_AMBULATORY_CARE_PROVIDER_SITE_OTHER): Payer: Medicare Other

## 2015-02-13 DIAGNOSIS — J309 Allergic rhinitis, unspecified: Secondary | ICD-10-CM | POA: Diagnosis not present

## 2015-02-17 DIAGNOSIS — M94261 Chondromalacia, right knee: Secondary | ICD-10-CM | POA: Diagnosis not present

## 2015-02-17 DIAGNOSIS — M1711 Unilateral primary osteoarthritis, right knee: Secondary | ICD-10-CM | POA: Diagnosis not present

## 2015-02-18 ENCOUNTER — Telehealth: Payer: Self-pay | Admitting: Family Medicine

## 2015-02-18 NOTE — Telephone Encounter (Signed)
See below [name] [name] 

## 2015-02-18 NOTE — Progress Notes (Signed)
Agree with this plan.

## 2015-02-18 NOTE — Telephone Encounter (Signed)
See below

## 2015-02-18 NOTE — Telephone Encounter (Signed)
As long as she is following closely with Dr. [name]- I am ok with 6 months

## 2015-02-18 NOTE — Telephone Encounter (Signed)
Pt states dr hunter assked her to call yesterday to fu. Dr [name] thinks it might be a tendon that runs through her leg that is causing her pain. He gave her a cortisone shot in her leg..  If you do not need to see her back for this issue, pt wants to know if a 6 mo fup is ok?

## 2015-02-18 NOTE — Telephone Encounter (Signed)
Pt aware and appt made.

## 2015-02-20 ENCOUNTER — Ambulatory Visit (INDEPENDENT_AMBULATORY_CARE_PROVIDER_SITE_OTHER): Payer: Medicare Other

## 2015-02-20 DIAGNOSIS — J309 Allergic rhinitis, unspecified: Secondary | ICD-10-CM

## 2015-02-23 ENCOUNTER — Telehealth: Payer: Self-pay | Admitting: Internal Medicine

## 2015-02-23 ENCOUNTER — Ambulatory Visit (INDEPENDENT_AMBULATORY_CARE_PROVIDER_SITE_OTHER): Payer: Medicare Other

## 2015-02-23 DIAGNOSIS — J309 Allergic rhinitis, unspecified: Secondary | ICD-10-CM

## 2015-02-23 NOTE — Telephone Encounter (Signed)
Allergy Serum Extract Date Mixed: 02/23/15 Vial: 2 Strength: 1:10 Here/Mail/Pick Up: here Mixed By: tbs

## 2015-02-27 ENCOUNTER — Ambulatory Visit (INDEPENDENT_AMBULATORY_CARE_PROVIDER_SITE_OTHER): Payer: Medicare Other

## 2015-02-27 DIAGNOSIS — J309 Allergic rhinitis, unspecified: Secondary | ICD-10-CM

## 2015-03-06 ENCOUNTER — Ambulatory Visit: Payer: Medicare Other

## 2015-03-06 ENCOUNTER — Encounter (HOSPITAL_COMMUNITY): Payer: Self-pay | Admitting: Emergency Medicine

## 2015-03-06 ENCOUNTER — Telehealth: Payer: Self-pay | Admitting: Family Medicine

## 2015-03-06 ENCOUNTER — Emergency Department (HOSPITAL_COMMUNITY)
Admission: EM | Admit: 2015-03-06 | Discharge: 2015-03-06 | Disposition: A | Discharge status: Home / Self Care | Payer: Medicare Other | Attending: Emergency Medicine | Admitting: Emergency Medicine

## 2015-03-06 DIAGNOSIS — E785 Hyperlipidemia, unspecified: Secondary | ICD-10-CM | POA: Diagnosis not present

## 2015-03-06 DIAGNOSIS — F419 Anxiety disorder, unspecified: Secondary | ICD-10-CM | POA: Diagnosis not present

## 2015-03-06 DIAGNOSIS — S40021A Contusion of right upper arm, initial encounter: Secondary | ICD-10-CM | POA: Diagnosis not present

## 2015-03-06 DIAGNOSIS — M1990 Unspecified osteoarthritis, unspecified site: Secondary | ICD-10-CM | POA: Diagnosis not present

## 2015-03-06 DIAGNOSIS — Z86718 Personal history of other venous thrombosis and embolism: Secondary | ICD-10-CM | POA: Insufficient documentation

## 2015-03-06 DIAGNOSIS — Z7901 Long term (current) use of anticoagulants: Secondary | ICD-10-CM | POA: Diagnosis not present

## 2015-03-06 DIAGNOSIS — K219 Gastro-esophageal reflux disease without esophagitis: Secondary | ICD-10-CM | POA: Insufficient documentation

## 2015-03-06 DIAGNOSIS — Z872 Personal history of diseases of the skin and subcutaneous tissue: Secondary | ICD-10-CM | POA: Diagnosis not present

## 2015-03-06 DIAGNOSIS — R791 Abnormal coagulation profile: Secondary | ICD-10-CM | POA: Diagnosis not present

## 2015-03-06 DIAGNOSIS — Z86010 Personal history of colonic polyps: Secondary | ICD-10-CM | POA: Insufficient documentation

## 2015-03-06 DIAGNOSIS — Z7982 Long term (current) use of aspirin: Secondary | ICD-10-CM | POA: Diagnosis not present

## 2015-03-06 DIAGNOSIS — Z79899 Other long term (current) drug therapy: Secondary | ICD-10-CM | POA: Insufficient documentation

## 2015-03-06 DIAGNOSIS — R1012 Left upper quadrant pain: Secondary | ICD-10-CM | POA: Diagnosis not present

## 2015-03-06 DIAGNOSIS — Z8669 Personal history of other diseases of the nervous system and sense organs: Secondary | ICD-10-CM | POA: Diagnosis not present

## 2015-03-06 DIAGNOSIS — R1011 Right upper quadrant pain: Secondary | ICD-10-CM | POA: Diagnosis not present

## 2015-03-06 DIAGNOSIS — J449 Chronic obstructive pulmonary disease, unspecified: Secondary | ICD-10-CM | POA: Diagnosis not present

## 2015-03-06 DIAGNOSIS — Z91040 Latex allergy status: Secondary | ICD-10-CM | POA: Diagnosis not present

## 2015-03-06 DIAGNOSIS — Z87891 Personal history of nicotine dependence: Secondary | ICD-10-CM | POA: Insufficient documentation

## 2015-03-06 DIAGNOSIS — Z8619 Personal history of other infectious and parasitic diseases: Secondary | ICD-10-CM | POA: Diagnosis not present

## 2015-03-06 DIAGNOSIS — M7981 Nontraumatic hematoma of soft tissue: Secondary | ICD-10-CM | POA: Diagnosis present

## 2015-03-06 LAB — CBC WITH DIFFERENTIAL/PLATELET
Basophils Absolute: 0 10*3/uL (ref 0.0–0.1)
Basophils Relative: 0 %
Eosinophils Absolute: 0.2 10*3/uL (ref 0.0–0.7)
Eosinophils Relative: 2 %
HEMATOCRIT: 40.4 % (ref 36.0–46.0)
HEMOGLOBIN: 12.8 g/dL (ref 12.0–15.0)
LYMPHS ABS: 1.9 10*3/uL (ref 0.7–4.0)
LYMPHS PCT: 20 %
MCH: 27.4 pg (ref 26.0–34.0)
MCHC: 31.7 g/dL (ref 30.0–36.0)
MCV: 86.3 fL (ref 78.0–100.0)
Monocytes Absolute: 0.7 10*3/uL (ref 0.1–1.0)
Monocytes Relative: 7 %
NEUTROS PCT: 71 %
Neutro Abs: 6.8 10*3/uL (ref 1.7–7.7)
Platelets: 222 10*3/uL (ref 150–400)
RBC: 4.68 MIL/uL (ref 3.87–5.11)
RDW: 14.5 % (ref 11.5–15.5)
WBC: 9.6 10*3/uL (ref 4.0–10.5)

## 2015-03-06 LAB — COMPREHENSIVE METABOLIC PANEL
ALK PHOS: 58 U/L (ref 38–126)
ALT: 18 U/L (ref 14–54)
AST: 21 U/L (ref 15–41)
Albumin: 4.3 g/dL (ref 3.5–5.0)
Anion gap: 7 (ref 5–15)
BILIRUBIN TOTAL: 0.7 mg/dL (ref 0.3–1.2)
BUN: 19 mg/dL (ref 6–20)
CALCIUM: 9.3 mg/dL (ref 8.9–10.3)
CO2: 27 mmol/L (ref 22–32)
CREATININE: 1.05 mg/dL — AB (ref 0.44–1.00)
Chloride: 104 mmol/L (ref 101–111)
GFR, EST AFRICAN AMERICAN: 53 mL/min — AB (ref >60)
GFR, EST NON AFRICAN AMERICAN: 46 mL/min — AB (ref >60)
Glucose, Bld: 108 mg/dL — ABNORMAL HIGH (ref 65–99)
Potassium: 4.4 mmol/L (ref 3.5–5.1)
Sodium: 138 mmol/L (ref 135–145)
Total Protein: 6.9 g/dL (ref 6.5–8.1)

## 2015-03-06 LAB — LIPASE, BLOOD: LIPASE: 31 U/L (ref 11–51)

## 2015-03-06 LAB — PROTIME-INR
INR: 9.21 (ref 0.00–1.49)
Prothrombin Time: 71.2 seconds — ABNORMAL HIGH (ref 11.6–15.2)

## 2015-03-06 MED ORDER — PHYTONADIONE 5 MG PO TABS
2.5000 mg | ORAL_TABLET | Freq: Once | ORAL | Status: AC
  Administered 2015-03-06: 2.5 mg via ORAL
  Filled 2015-03-06: qty 1

## 2015-03-06 NOTE — ED Notes (Signed)
Pt noticed a bruise on her R upper arm this am. Does not remember injuring arm. Pt on coumadin. Called PCP office and was told to come here for pt/inr.

## 2015-03-06 NOTE — Telephone Encounter (Signed)
[facility]  Patient Name: [name] [name]  DOB: July 14, 1924    Initial Comment Caller states on coumadin; has a huge bruise place on upper right outside arm; almost the size of an orange; just overnight; also a couple of other places lower on arm and one where had cortisone shot 2 wks ago but not as large;    Nurse Assessment  Nurse: Wynetta Emery, RN, Baker Janus Date/Time ([place] Time): 03/06/2015 10:08:26 AM  Confirm and document reason for call. If symptomatic, describe symptoms. ---Tomekia has a large purple bruise on upper arm and the injection site two weeks ago for cortisone shot is still discolored. on coumadin  Has the patient traveled out of the country within the last 30 days? ---No  Does the patient have any new or worsening symptoms? ---Yes  Will a triage be completed? ---Yes  Related visit to physician within the last 2 weeks? ---No  Does the PT have any chronic conditions? (i.e. diabetes, asthma, etc.) ---Yes  List chronic conditions. ---Coumadin - Blood clots in legs x 5     Guidelines    Guideline Title Affirmed Question Affirmed Notes  Bruises [1] Raised bruise AND [2] size > 2 inches (5 cm) AND [3] expanding    Final Disposition User   See Physician within 4 Hours (or PCP triage) Wynetta Emery, RN, Baker Janus    Comments    no appts available at this time -- patient at hospital volunteering advised to go to ED for evaluation and treatment may need PT/INR drawn.   Referrals  REFERRED TO PCP OFFICE   Disagree/Comply: Comply

## 2015-03-06 NOTE — Discharge Instructions (Signed)
 Warfarin: What You Need to Know Warfarin is an anticoagulant. Anticoagulants help prevent the formation of blood clots. They also help stop the growth of blood clots. Warfarin is sometimes referred to as a "blood thinner."  Normally, when body tissues are cut or damaged, the blood clots in order to prevent blood loss. Sometimes clots form inside your blood vessels and obstruct the flow of blood through your circulatory system (thrombosis). These clots may travel through your bloodstream and become lodged in smaller blood vessels in your brain, which can cause a stroke, or in your lungs (pulmonary embolism). WHO SHOULD USE WARFARIN? Warfarin is prescribed for people at risk of developing harmful blood clots:  People with surgically implanted mechanical heart valves, irregular heart rhythms called atrial fibrillation, and certain clotting disorders.  People who have developed harmful blood clotting in the past, including those who have had a stroke or a pulmonary embolism, or thrombosis in their legs (deep vein thrombosis [DVT]).  People with an existing blood clot, such as a pulmonary embolism. WARFARIN DOSING Warfarin tablets come in different strengths. Each tablet strength is a different color, with the amount of warfarin (in milligrams) clearly printed on the tablet. If the color of your tablet is different than usual when you receive a new prescription, report it immediately to your pharmacist or health care provider. WARFARIN MONITORING The goal of warfarin therapy is to lessen the clotting tendency of blood but not prevent clotting completely. Your health care provider will monitor the anticoagulation effect of warfarin closely and adjust your dose as needed. For your safety, blood tests called prothrombin time (PT) or international normalized ratio (INR) are used to measure the effects of warfarin. Both of these tests can be done with a finger stick or a blood draw. The longer it takes the  blood to clot, the higher the PT or INR. Your health care provider will inform you of your "target" PT or INR range. If, at any time, your PT or INR is above the target range, there is a risk of bleeding. If your PT or INR is below the target range, there is a risk of clotting. Whether you are started on warfarin while you are in the hospital or in your health care provider's office, you will need to have your PT or INR checked within one week of starting the medicine. Initially, some people are asked to have their PT or INR checked as much as twice a week. Once you are on a stable maintenance dose, the PT or INR is checked less often, usually once every 2 to 4 weeks. The warfarin dose may be adjusted if the PT or INR is not within the target range. It is important to keep all laboratory and health care provider follow-up appointments. Not keeping appointments could result in a chronic or permanent injury, pain, or disability because warfarin is a medicine that requires close monitoring. WHAT ARE THE SIDE EFFECTS OF WARFARIN?  Too much warfarin can cause bleeding (hemorrhage) from any part of the body. This may include bleeding from the gums, blood in the urine, bloody or dark stools, a nosebleed that is not easily stopped, coughing up blood, or vomiting blood.  Too little warfarin can increase the risk of blood clots.  Too little or too much warfarin can also increase the risk of a stroke.  Warfarin use may cause a skin rash or irritation, an unusual fever, continual nausea or stomach upset, or severe pain in your joints or   back. SPECIAL PRECAUTIONS WHILE TAKING WARFARIN Warfarin should be taken exactly as directed. It is very important to take warfarin as directed since bleeding or blood clots could result in chronic or permanent injury, pain, or disability.  Take your medicine at the same time every day. If you forget to take your dose, you can take it if it is within 6 hours of when it was  due.  Do not change the dose of warfarin on your own to make up for missed or extra doses.  If you miss more than 2 doses in a row, you should contact your health care provider for advice. Avoid situations that cause bleeding. You may have a tendency to bleed more easily than usual while taking warfarin. The following actions can limit bleeding:  Using a softer toothbrush.  Flossing with waxed floss rather than unwaxed floss.  Shaving with an electric razor rather than a blade.  Limiting the use of sharp objects.  Avoiding potentially harmful activities, such as contact sports. Warfarin and Pregnancy or Breastfeeding  Warfarin is not advised during the first trimester of pregnancy due to an increased risk of birth defects. In certain situations, a woman may take warfarin after her first trimester of pregnancy. A woman who becomes pregnant or plans to become pregnant while taking warfarin should notify her health care provider immediately.  Although warfarin does not pass into breast milk, a woman who wishes to breastfeed while taking warfarin should also consult with her health care provider. Alcohol, Smoking, and Illicit Drug Use  Alcohol affects how warfarin works in the body. It is best to avoid alcoholic drinks or consume very small amounts while taking warfarin. In general, alcohol intake should be limited to 1 oz (30 mL) of liquor, 6 oz (180 mL) of wine, or 12 oz (360 mL) of beer each day. Notify your health care provider if you change your alcohol intake.  Smoking affects how warfarin works. It is best to avoid smoking while taking warfarin. Notify your health care provider if you change your smoking habits.  It is best to avoid all illicit drugs while taking warfarin since there are few studies that show how warfarin interacts with these drugs. Other Medicines and Dietary Supplements Many prescription and over-the-counter medicines can interfere with warfarin. Be sure all of your  health care providers know you are taking warfarin. Notify your health care provider who prescribed warfarin for you or your pharmacist before starting or stopping any new medicines, including over-the-counter vitamins, dietary supplements, and pain medicines. Your warfarin dose may need to be adjusted. Some common over-the-counter medicines that may increase the risk of bleeding while taking warfarin include:   Acetaminophen.  Aspirin.  Nonsteroidal anti-inflammatory medicines (NSAIDs), such as ibuprofen or naproxen.  Vitamin E. Dietary Considerations  Foods that have moderate or high amounts of vitamin K can interfere with warfarin. Avoid major changes in your diet or notify your health care provider before changing your diet. Eat a consistent amount of foods that have moderate or high amounts of vitamin K. Eating less foods containing vitamin K can increase the risk of bleeding. Eating more foods containing vitamin K can increase the risk of blood clots. Additional questions about dietary considerations can be discussed with a dietitian. Foods that are very high in vitamin K:  Greens, such as Swiss chard and beet, collard, mustard, or turnip greens (fresh or frozen, cooked).  Kale (fresh or frozen, cooked).  Parsley (raw).  Spinach (cooked). Foods that are   high in vitamin K:  Asparagus (frozen, cooked).  Broccoli.  Bok choy (cooked).  Brussels sprouts (fresh or frozen, cooked).  Cabbage (cooked).   Coleslaw. Foods that are moderately high in vitamin K:  Blueberries.  Black-eyed peas.  Endive (raw).  Green leaf lettuce (raw).  Green scallions (raw).  Kale (raw).  Okra (frozen, cooked).  Plantains (fried).  Romaine lettuce (raw).  Sauerkraut (canned).  Spinach (raw). CALL YOUR CLINIC OR HEALTH CARE PROVIDER IF YOU:  Plan to have any surgery or procedure.  Feel sick, especially if you have diarrhea or vomiting.  Experience or anticipate any major changes  in your diet.  Start or stop a prescription or over-the-counter medicine.  Become, plan to become, or think you may be pregnant.  Are having heavier than usual menstrual periods.  Have had a fall, accident, or any symptoms of bleeding or unusual bruising.  Develop an unusual fever. CALL 911 IN THE [place] OR GO TO THE EMERGENCY DEPARTMENT IF YOU:   Think you may be having an allergic reaction to warfarin. The signs of an allergic reaction could include itching, rash, hives, swelling, chest tightness, or trouble breathing.  See signs of blood in your urine. The signs could include reddish, pinkish, or tea-colored urine.  See signs of blood in your stools. The signs could include bright red or black stools.  Vomit or cough up blood. In these instances, the blood could have either a bright red or a "coffee-grounds" appearance.  Have bleeding that will not stop after applying pressure for 30 minutes such as cuts, nosebleeds, or other injuries.  Have severe pain in your joints or back.  Have a new and severe headache.  Have sudden weakness or numbness of your face, arm, or leg, especially on one side of your body.  Have sudden confusion or trouble understanding.  Have sudden trouble seeing in one or both eyes.  Have sudden trouble walking, dizziness, loss of balance, or coordination.  Have trouble speaking or understanding (aphasia).   This information is not intended to replace advice given to you by your health care provider. Make sure you discuss any questions you have with your health care provider.   Document Released: 04/11/2005 Document Revised: 05/02/2014 Document Reviewed: 10/05/2012 Elsevier Interactive Patient Education 2016 [employer] Inc[employer]  

## 2015-03-06 NOTE — ED Provider Notes (Signed)
CSN: [id number]     Arrival date & time 03/06/15  22 History   First MD Initiated Contact with Patient 03/06/15 1102     Chief Complaint  Patient presents with  . bruise on arm      (Consider location/radiation/quality/duration/timing/severity/associated sxs/prior Treatment) Patient is a 81 y.o. female presenting with general illness.  Illness Location:  R humerus, lateral Quality:  Mild soreness, bruising Severity:  Moderate Onset quality:  Gradual Duration:  1 day Timing:  Constant Progression:  Worsening Chronicity:  New Context:  On coumadin, prior supratherapeutic episode  Relieved by:  Nothing Worsened by:  Nothing Associated symptoms: no abdominal pain, no loss of consciousness and no nausea     Past Medical History  Diagnosis Date  . GERD (gastroesophageal reflux disease)   . Hyperlipidemia   . History of colonic polyps   . Diverticulosis   . Shingles   . Eczema   . Allergic rhinoconjunctivitis   . Lung nodule 2011  . Environmental allergies     allergy shot- q friday in Dr. Janee Morn office. PFT's abnormal- recommended Spiriva to use preop & will d/c after surgery  . Arthritis     low back , stenosis  . Anxiety     pt. managed- uses deep breathing   . Stenosis of popliteal artery (HCC)     blood clots in legs long ago    . COPD (chronic obstructive pulmonary disease) ([place])   . H/O hiatal hernia   . Hypertension   . Trigeminal neuralgia   . Peripheral vascular disease ([place])   . DVT (deep venous thrombosis) ([place])   . COLONIC POLYPS, RECURRENT 08/29/2006    2008 last colonoscopy. No further colonoscopy.      Past Surgical History  Procedure Laterality Date  . Tubal ligation    . Cholecystectomy  1998  . Femoral-popliteal bypass graft         x2 surgeries 1990's & 2009  . Eye surgery      cataracts removed- bilateral /w IOL  . Tonsillectomy      as a teenager   . Lumbar fusion  07/06/2011  . Abdominal aortagram N/A 06/17/2011    Procedure: ABDOMINAL  Maxcine Ham;  Surgeon: Elam Dutch, MD;  [facility] C[facility] LAB;  Service: Cardiovascular;  Laterality: N/A;  . Injection knee Right Aug. 2016    Gel injection for pain   Family History  Problem Relation Age of Onset  . Arthritis Mother   . Hypertension Mother   . Heart disease Father   . Colon polyps Father   . Breast cancer Sister   . Lung cancer Sister   . Colon cancer Sister   . Cancer Sister     Breast  . Lung cancer Brother   . Cancer Brother     Lung  . Anesthesia problems Neg Hx   . Hypotension Neg Hx   . Malignant hyperthermia Neg Hx   . Pseudochol deficiency Neg Hx   . Cancer Daughter     Breast  . Hypertension Son    Social History  Substance Use Topics  . Smoking status: Former Smoker -- 2.00 packs/day for 30 years    Types: Cigarettes    Quit date: 06/18/1993  . Smokeless tobacco: Never Used     Comment: QUIT IN 1995  . Alcohol Use: No   OB History    No data available     Review of Systems  Gastrointestinal: Negative for nausea and abdominal pain.  Neurological: Negative for loss of consciousness.  All other systems reviewed and are negative.     Allergies  Sulfa antibiotics; Tiotropium bromide; and Latex  Home Medications   Prior to Admission medications   Medication Sig Start Date End Date Taking? Authorizing Provider  acetaminophen (TYLENOL) 325 MG tablet Take 325 mg by mouth as needed.     Historical Provider, MD  aspirin 81 MG tablet Take 81 mg by mouth daily.    Historical Provider, MD  Azelastine-Fluticasone (DYMISTA) 137-50 MCG/ACT SUSP Place 1-2 puffs into the nose at bedtime. 12/18/14 12/18/15  Deneise Lever, MD  calcium-vitamin D (OSCAL WITH D 500-200) 500-200 MG-UNIT per tablet Take 1 tablet by mouth daily.      Historical Provider, MD  Cholecalciferol (VITAMIN D3) 1000 UNITS CAPS Take 1,000 Units by mouth daily.     Historical Provider, MD  gabapentin (NEURONTIN) 100 MG capsule Take 1 capsule (100 mg total) by mouth at bedtime.  01/12/15   Ward Givens, NP  glucosamine-chondroitin 500-400 MG tablet Take 2 tablets by mouth daily.    Historical Provider, MD  NONFORMULARY OR COMPOUNDED ITEM Allergy Vaccine 1:10 Given at [facility]    Historical Provider, MD  omeprazole (PRILOSEC) 20 MG capsule Take 20 mg by mouth every morning.     Historical Provider, MD  simvastatin (ZOCOR) 40 MG tablet TAKE 1 TABLET (40 MG TOTAL) BY MOUTH DAILY AT 6 PM. 02/09/15   Marin Olp, MD  traMADol (ULTRAM) 50 MG tablet Take 50 mg by mouth as needed.     Historical Provider, MD  valsartan-hydrochlorothiazide (DIOVAN-HCT) 320-25 MG per tablet Take 1 tablet by mouth daily. 12/04/14   Marin Olp, MD  warfarin (COUMADIN) 5 MG tablet Take 2-2.5 tablets (10-12.5 mg total) by mouth See admin instructions. Take 2 and 1/2 tablets on Thursday then take 2 tablets the rest of the days Patient taking differently: Take 5 mg by mouth See admin instructions. Take as directed 10/03/14   Marin Olp, MD   BP 159/53 mmHg  Pulse 64  Temp(Src) 97.5 F (36.4 C) (Oral)  Resp 16  SpO2 94% Physical Exam  Constitutional: She is oriented to person, place, and time. She appears well-developed and well-nourished.  HENT:  Head: Normocephalic and atraumatic.  Right Ear: External ear normal.  Left Ear: External ear normal.  Eyes: Conjunctivae and EOM are normal. Pupils are equal, round, and reactive to light.  Neck: Normal range of motion. Neck supple.  Cardiovascular: Normal rate, regular rhythm, normal heart sounds and intact distal pulses.   Pulmonary/Chest: Effort normal and breath sounds normal.  Abdominal: Soft. Bowel sounds are normal. There is tenderness (mild) in the right upper quadrant, epigastric area and left upper quadrant.  Musculoskeletal: Normal range of motion.  Neurological: She is alert and oriented to person, place, and time.  Skin: Skin is warm and dry.  Vitals reviewed.   ED Course  Procedures (including critical care  time) Labs Review Labs Reviewed - No data to display  Imaging Review No results found. I have personally reviewed and evaluated these images and lab results as part of my medical decision-making.   EKG Interpretation None      MDM   Final diagnoses:  None    81 y.o. female with pertinent PMH of prior DVT x 5 on coumadin, prior supratherapeutic presents with bruising to R upper arm.  No known trauma.  Not painful.  NV intact.  Wu obtained and with supratherapeutic INR.  No other symptoms.  Vit K 2.5 administered PO.  Stable to dc home with pcp fu.    I have reviewed all laboratory and imaging studies if ordered as above  1. Supratherapeutic INR         Debby Freiberg, MD 03/08/15 [id number]

## 2015-03-06 NOTE — Telephone Encounter (Signed)
Patient checked into ED 

## 2015-03-07 ENCOUNTER — Ambulatory Visit (INDEPENDENT_AMBULATORY_CARE_PROVIDER_SITE_OTHER): Payer: Medicare Other | Admitting: Emergency Medicine

## 2015-03-07 VITALS — BP 128/56 | HR 70 | Temp 97.4°F | Resp 16 | Ht 59.5 in | Wt 177.2 lb

## 2015-03-07 DIAGNOSIS — I82409 Acute embolism and thrombosis of unspecified deep veins of unspecified lower extremity: Secondary | ICD-10-CM | POA: Insufficient documentation

## 2015-03-07 DIAGNOSIS — I824Y9 Acute embolism and thrombosis of unspecified deep veins of unspecified proximal lower extremity: Secondary | ICD-10-CM | POA: Diagnosis not present

## 2015-03-07 DIAGNOSIS — I739 Peripheral vascular disease, unspecified: Secondary | ICD-10-CM

## 2015-03-07 LAB — PROTIME-INR
INR: 3.4 — ABNORMAL HIGH (ref <1.50)
Prothrombin Time: 34.9 seconds — ABNORMAL HIGH (ref 11.6–15.2)

## 2015-03-07 NOTE — Progress Notes (Signed)
Subjective:  Patient ID: [name] [name], female    DOB: 04-Jan-1925  Age: 81 y.o. MRN: [id number]  CC: needs lab work   HPI [name] [name] presents   With a history of of large bruise on her right shoulder. She was seen in the emergency room yesterday and had an INR of 9.7. She was given a half a tablet of  Vitamin K. She has come in today to have her recheck on her INR. She has no other known bleeding. No blood in her stool black stool no nausea or vomiting blood in her urine.  History [name] [name] has a past medical history of GERD (gastroesophageal reflux disease); Hyperlipidemia; History of colonic polyps; Diverticulosis; Shingles; Eczema; Allergic rhinoconjunctivitis; Lung nodule (2011); Environmental allergies; Arthritis; Anxiety; Stenosis of popliteal artery (HCC); COPD (chronic obstructive pulmonary disease) ([place]); H/O hiatal hernia; Hypertension; Trigeminal neuralgia; Peripheral vascular disease ([place]); DVT (deep venous thrombosis) ([place]); and COLONIC POLYPS, RECURRENT (08/29/2006).   She has past surgical history that includes Tubal ligation; Cholecystectomy (1998); Femoral-popliteal Bypass Graft; Eye surgery; Tonsillectomy; Lumbar fusion (07/06/2011); abdominal aortagram (N/A, 06/17/2011); and Injection knee (Right, Aug. 2016).   Her  family history includes Arthritis in her mother; Breast cancer in her sister; Cancer in her brother, daughter, and sister; Colon cancer in her sister; Colon polyps in her father; Heart disease in her father; Hypertension in her mother and son; Lung cancer in her brother and sister. There is no history of Anesthesia problems, Hypotension, Malignant hyperthermia, or Pseudochol deficiency.  She   reports that she quit smoking about 21 years ago. Her smoking use included Cigarettes. She has a 60 pack-year smoking history. She has never used smokeless tobacco. She reports that she does not drink alcohol or use illicit drugs.  Outpatient Prescriptions Prior to  Visit  Medication Sig Dispense Refill  . acetaminophen (TYLENOL) 500 MG tablet Take 500-1,500 mg by mouth 3 (three) times daily as needed for moderate pain.    Marland Kitchen aspirin EC 81 MG tablet Take 81 mg by mouth daily.    . calcium-vitamin D (OSCAL WITH D 500-200) 500-200 MG-UNIT per tablet Take 1 tablet by mouth daily.      . Cholecalciferol (VITAMIN D3) 1000 UNITS CAPS Take 1,000 Units by mouth daily.     Marland Kitchen glucosamine-chondroitin 500-400 MG tablet Take 1 tablet by mouth 2 (two) times daily.     . NONFORMULARY OR COMPOUNDED ITEM Inject 1 mL into the skin once a week. Allergy Vaccine 1:10 Given at [facility] on Friday    . omeprazole (PRILOSEC) 20 MG capsule Take 20 mg by mouth every morning.     . simvastatin (ZOCOR) 40 MG tablet TAKE 1 TABLET (40 MG TOTAL) BY MOUTH DAILY AT 6 PM. 90 tablet 3  . valsartan-hydrochlorothiazide (DIOVAN-HCT) 320-25 MG per tablet Take 1 tablet by mouth daily. 30 tablet 5  . warfarin (COUMADIN) 5 MG tablet Take 2-2.5 tablets (10-12.5 mg total) by mouth See admin instructions. Take 2 and 1/2 tablets on Thursday then take 2 tablets the rest of the days (Patient taking differently: Take 5-10 mg by mouth daily. On Sunday, Tuesday and Thursday take 10mg  and all other days take 5mg  qd. Take as directed) 180 tablet 1  . Azelastine-Fluticasone (DYMISTA) 137-50 MCG/ACT SUSP Place 1-2 puffs into the nose at bedtime. (Patient not taking: Reported on 03/06/2015) 1 Bottle prn  . gabapentin (NEURONTIN) 100 MG capsule Take 1 capsule (100 mg total) by mouth at bedtime. (Patient not taking: Reported  on 03/06/2015) 30 capsule 3   No facility-administered medications prior to visit.    Social History   Social History  . Marital Status: Single    Spouse Name: N/A  . Number of Children: 4  . Years of Education: 12   Occupational History  .     Social History Main Topics  . Smoking status: Former Smoker -- 2.00 packs/day for 30 years    Types: Cigarettes    Quit date:  06/18/1993  . Smokeless tobacco: Never Used     Comment: QUIT IN 1995  . Alcohol Use: No  . Drug Use: No  . Sexual Activity: Not Asked   Other Topics Concern  . None   Social History Narrative   Patient is widowed with 4 children. 2 grandkids. Lives alone.    Patient is right handed.   Patient has high school education.   Patient drinks 1 cup daily.      Retired from [employer] for 28 years, works 2 days a week until 2016.       Hobbies: volunteers at [employer], [place] from church visits people.            Review of Systems  Constitutional: Negative for fever, chills and appetite change.  HENT: Negative for congestion, ear pain, postnasal drip, sinus pressure and sore throat.   Eyes: Negative for pain and redness.  Respiratory: Negative for cough, shortness of breath and wheezing.   Cardiovascular: Negative for leg swelling.  Gastrointestinal: Negative for nausea, vomiting, abdominal pain, diarrhea, constipation and blood in stool.  Endocrine: Negative for polyuria.  Genitourinary: Negative for dysuria, urgency, frequency and flank pain.  Musculoskeletal: Negative for gait problem.  Skin: Positive for color change. Negative for rash.  Neurological: Negative for weakness and headaches.  Psychiatric/Behavioral: Negative for confusion and decreased concentration. The patient is not nervous/anxious.     Objective:  BP 128/56 mmHg  Pulse 70  Temp(Src) 97.4 F (36.3 C) (Oral)  Resp 16  Ht 4' 11.5" (1.511 m)  Wt 177 lb 3.2 oz (80.377 kg)  BMI 35.20 kg/m2  SpO2 97%  Physical Exam  Constitutional: She is oriented to person, place, and time. She appears well-developed and well-nourished.  HENT:  Head: Normocephalic and atraumatic.  Eyes: Conjunctivae are normal. Pupils are equal, round, and reactive to light.  Pulmonary/Chest: Effort normal.  Musculoskeletal: She exhibits no edema.  Neurological: She is alert and oriented to person, place, and time.    Skin: Skin is dry. Ecchymosis noted.  Psychiatric: She has a normal mood and affect. Her behavior is normal. Thought content normal.      Assessment & Plan:   Besa was seen today for needs lab work.  Diagnoses and all orders for this visit:  Deep vein thrombosis (DVT) of proximal lower extremity, unspecified chronicity, unspecified laterality ([place]) -     Protime-INR   I am having Ms. [name] [name] maintain her calcium-vitamin D, omeprazole, Vitamin D3, warfarin, valsartan-hydrochlorothiazide, NONFORMULARY OR COMPOUNDED ITEM, glucosamine-chondroitin, Azelastine-Fluticasone, gabapentin, simvastatin, acetaminophen, and aspirin EC.  No orders of the defined types were placed in this encounter.    Appropriate red flag conditions were discussed with the patient as well as actions that should be taken.  Patient expressed his understanding.  Follow-up: Return if symptoms worsen or fail to improve.  Roselee Culver, MD

## 2015-03-07 NOTE — Patient Instructions (Signed)
Prothrombin Time, International Normalized Ratio Test WHY AM I HAVING THIS TEST? A prothrombin time (Pro-Time, PT) test measures how many seconds it takes your blood to clot. The international normalized ratio (INR) is a calculation of blood clotting time based on your PT result. Most labs report both PT and INR values when reporting blood clotting times. Your health care provider may want you to have this test done if:  You have certain medical conditions that cause abnormal bleeding or blood clotting. These can include:  Liver disease.  Systemic infection (sepsis).  Inherited (genetic) bleeding disorders.  You are taking a medicine to prevent excessive blood clotting (anticoagulant), such as warfarin.  If you are taking warfarin, you will likely be asked to have this test done at regular intervals. The results of this test will help your health care provider determine what dose of warfarin you need based on how quickly or slowly your blood clots. It is very important to have this test done as often as your health care provider recommends. WHAT KIND OF SAMPLE IS TAKEN? A blood sample is required for this test. It is usually collected by inserting a needle into a vein or by sticking a finger with a small needle. HOW DO I PREPARE FOR THE TEST? There is no preparation required for this test. WHAT ARE THE REFERENCE INTERVALS? Reference intervalsare considered healthy intervalsestablished after testing a large group of healthy people. Reference intervals may vary among different people, labs, and hospitals. It is your responsibility to obtain your test results. Ask the lab or department performing the test when and how you will get your results. Reference intervals for this test are as follows:  Without anticoagulant treatment (control value): 11.0-12.5 seconds; 85-100%.  With full anticoagulant treatment: greater than 1.5-2 times the control value; 20-30%.  INR: 0.8-1.1. WHAT DO THE  RESULTS MEAN? There are several factors that can alter your PT and INR test results. It is important for you to know that:  PT and INR results can be affected by certain foods you eat, especially foods that contain moderate or high amounts of vitamin K. It is important to eat a consistent amount of vitamin K-rich food. Let your health care provider know if you have recently changed your diet.  PT and INR results can be affected by some medicines. Do not stop, add, or change any medicines without letting your health care provider know. Talk with your health care provider to discuss your results, treatment options, and if necessary, the need for more tests. Talk with your health care provider if you have any questions about your results.   This information is not intended to replace advice given to you by your health care provider. Make sure you discuss any questions you have with your health care provider.   Document Released: 05/14/2004 Document Revised: 05/02/2014 Document Reviewed: 09/04/2013 Elsevier Interactive Patient Education [employer].

## 2015-03-08 ENCOUNTER — Encounter (HOSPITAL_COMMUNITY): Payer: Self-pay | Admitting: Emergency Medicine

## 2015-03-08 ENCOUNTER — Telehealth: Payer: Self-pay | Admitting: Family Medicine

## 2015-03-08 ENCOUNTER — Emergency Department (HOSPITAL_COMMUNITY)
Admission: EM | Admit: 2015-03-08 | Discharge: 2015-03-08 | Discharge status: Against Medical Advice | Payer: Medicare Other | Attending: Emergency Medicine | Admitting: Emergency Medicine

## 2015-03-08 DIAGNOSIS — I10 Essential (primary) hypertension: Secondary | ICD-10-CM | POA: Diagnosis not present

## 2015-03-08 DIAGNOSIS — K625 Hemorrhage of anus and rectum: Secondary | ICD-10-CM | POA: Insufficient documentation

## 2015-03-08 DIAGNOSIS — J449 Chronic obstructive pulmonary disease, unspecified: Secondary | ICD-10-CM | POA: Insufficient documentation

## 2015-03-08 NOTE — Telephone Encounter (Signed)
Call form patient as had not received call about INR yesterday.  She was seen in the emergency room 2 days ago as more bruises notedand had an INR of 9.7. Unknown why level was so high.  She was given a half a tablet of Vitamin K. She has continued to hold coumadin. Usually on 10mg  qd 3 days per week, 5mg  qd 4 days per week.  Last dose of 10mg  3 nights ago.  Denies new HA, slurred speech, focal weakness, hematuria.  Has noticed diarrhea since yesterday, very dark stools, not tarry, but very dark.  Has been lightheaded since she woke up this am. Better now, but noted most of the morning.   Will have evaluated at [facility] for possible drop in HGB with lightheadedness and possible GIB with prior supratherapeutic INR and dark diarrhea. Triage at [facility] advised.   Results for orders placed or performed in visit on 03/07/15  Protime-INR  Result Value Ref Range   Prothrombin Time 34.9 (H) 11.6 - 15.2 seconds   INR 3.40 (H) <1.50

## 2015-03-08 NOTE — ED Notes (Signed)
Pt states that she was here on Friday because her INR was 9.7.  Had it rechecked at [place].  It had come down to 3.4.  Pt states that since then, she had black diarrhea.  Was sent by her doctor to have hemoglobin checked.

## 2015-03-08 NOTE — ED Notes (Signed)
Pt called for second time with no answer from lobby

## 2015-03-08 NOTE — ED Notes (Signed)
Pt not answering from lobby

## 2015-03-08 NOTE — Telephone Encounter (Signed)
Opened in error. See new telephone note.

## 2015-03-09 ENCOUNTER — Encounter: Payer: Self-pay | Admitting: Family Medicine

## 2015-03-09 ENCOUNTER — Ambulatory Visit (INDEPENDENT_AMBULATORY_CARE_PROVIDER_SITE_OTHER): Payer: Medicare Other | Admitting: General Practice

## 2015-03-09 ENCOUNTER — Ambulatory Visit (INDEPENDENT_AMBULATORY_CARE_PROVIDER_SITE_OTHER): Payer: Medicare Other | Admitting: Family Medicine

## 2015-03-09 ENCOUNTER — Ambulatory Visit: Payer: Medicare Other

## 2015-03-09 VITALS — BP 132/64 | HR 67 | Temp 98.2°F | Wt 176.0 lb

## 2015-03-09 DIAGNOSIS — R195 Other fecal abnormalities: Secondary | ICD-10-CM | POA: Diagnosis not present

## 2015-03-09 DIAGNOSIS — Z5181 Encounter for therapeutic drug level monitoring: Secondary | ICD-10-CM | POA: Diagnosis not present

## 2015-03-09 DIAGNOSIS — D649 Anemia, unspecified: Secondary | ICD-10-CM

## 2015-03-09 DIAGNOSIS — R791 Abnormal coagulation profile: Secondary | ICD-10-CM

## 2015-03-09 LAB — CBC
HCT: 39.5 % (ref 36.0–46.0)
Hemoglobin: 13.2 g/dL (ref 12.0–15.0)
MCHC: 33.3 g/dL (ref 30.0–36.0)
MCV: 83.5 fl (ref 78.0–100.0)
Platelets: 202 10*3/uL (ref 150.0–400.0)
RBC: 4.74 Mil/uL (ref 3.87–5.11)
RDW: 14.5 % (ref 11.5–15.5)
WBC: 9.6 10*3/uL (ref 4.0–10.5)

## 2015-03-09 LAB — POCT HEMOGLOBIN: HEMOGLOBIN: 11.5 g/dL — AB (ref 12.2–16.2)

## 2015-03-09 LAB — COMPREHENSIVE METABOLIC PANEL
ALBUMIN: 4.5 g/dL (ref 3.5–5.2)
ALK PHOS: 59 U/L (ref 39–117)
ALT: 17 U/L (ref 0–35)
AST: 18 U/L (ref 0–37)
BILIRUBIN TOTAL: 0.5 mg/dL (ref 0.2–1.2)
BUN: 24 mg/dL — AB (ref 6–23)
CO2: 28 mEq/L (ref 19–32)
CREATININE: 1 mg/dL (ref 0.40–1.20)
Calcium: 9.8 mg/dL (ref 8.4–10.5)
Chloride: 104 mEq/L (ref 96–112)
GFR: 55.57 mL/min — ABNORMAL LOW (ref >60.00)
GLUCOSE: 100 mg/dL — AB (ref 70–99)
Potassium: 4.4 mEq/L (ref 3.5–5.1)
SODIUM: 140 meq/L (ref 135–145)
TOTAL PROTEIN: 6.9 g/dL (ref 6.0–8.3)

## 2015-03-09 LAB — POCT INR: INR: 2

## 2015-03-09 NOTE — Progress Notes (Signed)
[name], MD  Subjective:  ANTOINE REDDINGTON is a 81 y.o. year old very pleasant female patient who presents for/with See problem oriented charting ROS- No chest pain or shortness of breath. No headache or blurry vision. No BRBPR. No obvious melena btu some darker stools.   Past Medical History-  Patient Active Problem List   Diagnosis Date Noted  . Pain in joint, lower leg 08/28/2014    Priority: High  . Peripheral arterial disease ([place]) 06/29/2007    Priority: High  . Trigeminal neuralgia     Priority: Medium  . COPD mixed type ([place]) 01/22/2010    Priority: Medium  . Hyperglycemia 01/06/2009    Priority: Medium  . Hyperlipidemia 10/23/2006    Priority: Medium  . Essential hypertension 10/23/2006    Priority: Medium  . ESOPHAGEAL STRICTURE 08/09/1993    Priority: Medium  . Encounter for therapeutic drug monitoring 10/21/2013    Priority: Low  . Syncope 12/29/2011    Priority: Low  . Allergic rhinitis due to pollen 06/25/2010    Priority: Low  . Solitary pulmonary nodule 01/12/2010    Priority: Low  . Osteopenia 01/06/2009    Priority: Low  . ECZEMA 11/26/2007    Priority: Low  . DVT (deep venous thrombosis) ([place]) 03/07/2015    Medications- reviewed and updated Current Outpatient Prescriptions  Medication Sig Dispense Refill  . aspirin EC 81 MG tablet Take 81 mg by mouth daily.    . Azelastine-Fluticasone (DYMISTA) 137-50 MCG/ACT SUSP Place 1-2 puffs into the nose at bedtime. 1 Bottle prn  . calcium-vitamin D (OSCAL WITH D 500-200) 500-200 MG-UNIT per tablet Take 1 tablet by mouth daily.      . Cholecalciferol (VITAMIN D3) 1000 UNITS CAPS Take 1,000 Units by mouth daily.     Marland Kitchen gabapentin (NEURONTIN) 100 MG capsule Take 1 capsule (100 mg total) by mouth at bedtime. 30 capsule 3  . glucosamine-chondroitin 500-400 MG tablet Take 1 tablet by mouth 2 (two) times daily.     . NONFORMULARY OR COMPOUNDED ITEM Inject 1 mL into the skin once a week. Allergy  Vaccine 1:10 Given at [facility] on Friday    . omeprazole (PRILOSEC) 20 MG capsule Take 20 mg by mouth every morning.     . simvastatin (ZOCOR) 40 MG tablet TAKE 1 TABLET (40 MG TOTAL) BY MOUTH DAILY AT 6 PM. 90 tablet 3  . valsartan-hydrochlorothiazide (DIOVAN-HCT) 320-25 MG per tablet Take 1 tablet by mouth daily. 30 tablet 5  . warfarin (COUMADIN) 5 MG tablet Take 2-2.5 tablets (10-12.5 mg total) by mouth See admin instructions. Take 2 and 1/2 tablets on Thursday then take 2 tablets the rest of the days (Patient taking differently: Take 5-10 mg by mouth daily. On Sunday, Tuesday and Thursday take 10mg  and all other days take 5mg  qd. Take as directed) 180 tablet 1  . acetaminophen (TYLENOL) 500 MG tablet Take 500-1,500 mg by mouth 3 (three) times daily as needed for moderate pain.     No current facility-administered medications for this visit.    Objective: BP 132/64 mmHg  Pulse 67  Temp(Src) 98.2 F (36.8 C)  Wt 176 lb (79.833 kg) Gen: NAD, resting comfortably, appears stated age CV: RRR no murmurs rubs or gallops Lungs: CTAB no crackles, wheeze, rhonchi Abdomen: soft/nontender/nondistended/normal bowel sounds. No rebound or guarding.  Ext: trace edema Skin: warm, dry Neuro: grossly normal, moves all extremities, rises without difficulty and normal gait  Assessment/Plan:  Supra therapeutic INR Anemia ?  GI blood loss S: Had bruising on arm last Friday. Went to ED and found to have INR 9.7. She is on coumadin for PAD with INR goal 2-3.5 with history of multiple blockages though cannot find record fo DVT documented in ED. She was given vitamin K 2.5mg  as no active bleeding and discharged home. Had follow up next day and INR was 3.4 at [facility]. System was down and patient was called next day and she reported diarrhea and possible melena. She was told to go to ED. She went but did not want to wait 4 hours so went home. She did have some abdominal pain in ED which has  since resolved.   Today, she presented for INR check and I requested a hemoglobin as well which was 11.5 down from 12.8. Her INR was down to 2. She reports Bruising improving- one on shoulder size of an orange now improving. She had Loose stools since Friday. She does note she has had Darker stool but not as dark as melena she had in past. These were on Saturday but color has normalized. Still has had Loose stools 3x this morning. Since loose stools, has had some midl dizziness with standing up.  A/P: 81 year old with supratherapeutic INR 3 days ago now corrected with concern of melena as well as presenting with orthostatic symptoms. She was found not to be orthostatic on BP exam. She does have symptoms of GI illness/gastroenteritis with loose stools that could be leading to mild dehydration and dizziness with standing. POC hgb concerning. We sent stat CBC and hemoglobin >13 so no drop. Patient not interested in rectal exam and with no longer having dark stools that were not clearly melena- we opted to monitor stools and symptoms. Patient will resume coumadin through our lab and [name], RN. She appears clinically well and able to go home. Emergent return precautions given  Orders Placed This Encounter  Procedures  . CBC    [place]  . Comprehensive metabolic panel      . POC INR  . POC Hemoglobin    Results for orders placed or performed in visit on 03/09/15 (from the past 24 hour(s))  POC INR     Status: None   Collection Time: 03/09/15 10:00 AM  Result Value Ref Range   INR 2.0   POC Hemoglobin     Status: Abnormal   Collection Time: 03/09/15 10:01 AM  Result Value Ref Range   Hemoglobin 11.5 (A) 12.2 - 16.2 g/dL  CBC     Status: None   Collection Time: 03/09/15 12:04 PM  Result Value Ref Range   WBC 9.6 4.0 - 10.5 K/uL   RBC 4.74 3.87 - 5.11 Mil/uL   Platelets 202.0 150.0 - 400.0 K/uL   Hemoglobin 13.2 12.0 - 15.0 g/dL   HCT 39.5 36.0 - 46.0 %   MCV 83.5 78.0 - 100.0 fl    MCHC 33.3 30.0 - 36.0 g/dL   RDW 14.5 11.5 - 15.5 %  Comprehensive metabolic panel     Status: Abnormal   Collection Time: 03/09/15 12:04 PM  Result Value Ref Range   Sodium 140 135 - 145 mEq/L   Potassium 4.4 3.5 - 5.1 mEq/L   Chloride 104 96 - 112 mEq/L   CO2 28 19 - 32 mEq/L   Glucose, Bld 100 (H) 70 - 99 mg/dL   BUN 24 (H) 6 - 23 mg/dL   Creatinine, Ser 1.00 0.40 - 1.20 mg/dL  Total Bilirubin 0.5 0.2 - 1.2 mg/dL   Alkaline Phosphatase 59 39 - 117 U/L   AST 18 0 - 37 U/L   ALT 17 0 - 35 U/L   Total Protein 6.9 6.0 - 8.3 g/dL   Albumin 4.5 3.5 - 5.2 g/dL   Calcium 9.8 8.4 - 10.5 mg/dL   GFR 55.57 (L) >60.00 mL/min

## 2015-03-09 NOTE — Progress Notes (Signed)
Pre visit review using our clinic review tool, if applicable. No additional management support is needed unless otherwise documented below in the visit note. 

## 2015-03-09 NOTE — Patient Instructions (Signed)
We are going to get labs to see if you have lost blood in your stool  Do not take coumadin until we tell you- Jenny Reichmann will call you later today about this  If you are having new or worsening symptoms- call our after hours doctor or get to the emergency room

## 2015-03-12 ENCOUNTER — Ambulatory Visit: Payer: Medicare Other

## 2015-03-13 ENCOUNTER — Ambulatory Visit (INDEPENDENT_AMBULATORY_CARE_PROVIDER_SITE_OTHER): Payer: Medicare Other

## 2015-03-13 DIAGNOSIS — J309 Allergic rhinitis, unspecified: Secondary | ICD-10-CM | POA: Diagnosis not present

## 2015-03-16 ENCOUNTER — Ambulatory Visit (INDEPENDENT_AMBULATORY_CARE_PROVIDER_SITE_OTHER): Payer: Medicare Other | Admitting: General Practice

## 2015-03-16 DIAGNOSIS — Z5181 Encounter for therapeutic drug level monitoring: Secondary | ICD-10-CM | POA: Diagnosis not present

## 2015-03-16 DIAGNOSIS — I749 Embolism and thrombosis of unspecified artery: Secondary | ICD-10-CM | POA: Diagnosis not present

## 2015-03-16 LAB — POCT INR: INR: 6.2

## 2015-03-16 NOTE — Progress Notes (Signed)
Pre visit review using our clinic review tool, if applicable. No additional management support is needed unless otherwise documented below in the visit note. 

## 2015-03-20 ENCOUNTER — Ambulatory Visit (INDEPENDENT_AMBULATORY_CARE_PROVIDER_SITE_OTHER): Payer: Medicare Other

## 2015-03-20 DIAGNOSIS — J309 Allergic rhinitis, unspecified: Secondary | ICD-10-CM | POA: Diagnosis not present

## 2015-03-23 ENCOUNTER — Ambulatory Visit (INDEPENDENT_AMBULATORY_CARE_PROVIDER_SITE_OTHER): Payer: Medicare Other | Admitting: General Practice

## 2015-03-23 DIAGNOSIS — I749 Embolism and thrombosis of unspecified artery: Secondary | ICD-10-CM

## 2015-03-23 DIAGNOSIS — Z5181 Encounter for therapeutic drug level monitoring: Secondary | ICD-10-CM

## 2015-03-23 LAB — POCT INR: INR: 2.3

## 2015-03-23 NOTE — Progress Notes (Signed)
Pre visit review using our clinic review tool, if applicable. No additional management support is needed unless otherwise documented below in the visit note. 

## 2015-03-27 ENCOUNTER — Ambulatory Visit (INDEPENDENT_AMBULATORY_CARE_PROVIDER_SITE_OTHER): Payer: Medicare Other

## 2015-03-27 ENCOUNTER — Other Ambulatory Visit: Payer: Self-pay | Admitting: Family Medicine

## 2015-03-27 DIAGNOSIS — J309 Allergic rhinitis, unspecified: Secondary | ICD-10-CM

## 2015-03-30 ENCOUNTER — Ambulatory Visit (INDEPENDENT_AMBULATORY_CARE_PROVIDER_SITE_OTHER): Payer: Medicare Other | Admitting: General Practice

## 2015-03-30 DIAGNOSIS — I749 Embolism and thrombosis of unspecified artery: Secondary | ICD-10-CM

## 2015-03-30 DIAGNOSIS — Z5181 Encounter for therapeutic drug level monitoring: Secondary | ICD-10-CM | POA: Diagnosis not present

## 2015-03-30 LAB — POCT INR: INR: 4

## 2015-03-30 NOTE — Progress Notes (Signed)
Pre visit review using our clinic review tool, if applicable. No additional management support is needed unless otherwise documented below in the visit note. 

## 2015-03-31 DIAGNOSIS — Z803 Family history of malignant neoplasm of breast: Secondary | ICD-10-CM | POA: Diagnosis not present

## 2015-03-31 DIAGNOSIS — Z1231 Encounter for screening mammogram for malignant neoplasm of breast: Secondary | ICD-10-CM | POA: Diagnosis not present

## 2015-03-31 LAB — HM MAMMOGRAPHY: HM MAMMO: NORMAL

## 2015-04-02 ENCOUNTER — Encounter: Payer: Self-pay | Admitting: Internal Medicine

## 2015-04-02 ENCOUNTER — Encounter: Payer: Self-pay | Admitting: Family Medicine

## 2015-04-03 ENCOUNTER — Ambulatory Visit (INDEPENDENT_AMBULATORY_CARE_PROVIDER_SITE_OTHER): Payer: Medicare Other

## 2015-04-03 DIAGNOSIS — J309 Allergic rhinitis, unspecified: Secondary | ICD-10-CM

## 2015-04-10 ENCOUNTER — Ambulatory Visit (INDEPENDENT_AMBULATORY_CARE_PROVIDER_SITE_OTHER): Payer: Medicare Other

## 2015-04-10 ENCOUNTER — Ambulatory Visit (INDEPENDENT_AMBULATORY_CARE_PROVIDER_SITE_OTHER): Payer: Medicare Other | Admitting: General Practice

## 2015-04-10 ENCOUNTER — Other Ambulatory Visit: Payer: Self-pay | Admitting: General Practice

## 2015-04-10 DIAGNOSIS — Z5181 Encounter for therapeutic drug level monitoring: Secondary | ICD-10-CM | POA: Diagnosis not present

## 2015-04-10 DIAGNOSIS — J309 Allergic rhinitis, unspecified: Secondary | ICD-10-CM | POA: Diagnosis not present

## 2015-04-10 DIAGNOSIS — I749 Embolism and thrombosis of unspecified artery: Secondary | ICD-10-CM | POA: Diagnosis not present

## 2015-04-10 LAB — POCT INR: INR: 2.5

## 2015-04-10 NOTE — Progress Notes (Signed)
I have reviewed and agree with the plan. 

## 2015-04-10 NOTE — Progress Notes (Signed)
Pre visit review using our clinic review tool, if applicable. No additional management support is needed unless otherwise documented below in the visit note. 

## 2015-04-17 ENCOUNTER — Ambulatory Visit: Payer: Medicare Other

## 2015-04-24 ENCOUNTER — Ambulatory Visit (INDEPENDENT_AMBULATORY_CARE_PROVIDER_SITE_OTHER): Payer: Medicare Other | Admitting: General Practice

## 2015-04-24 ENCOUNTER — Ambulatory Visit (INDEPENDENT_AMBULATORY_CARE_PROVIDER_SITE_OTHER): Payer: Medicare Other

## 2015-04-24 DIAGNOSIS — I749 Embolism and thrombosis of unspecified artery: Secondary | ICD-10-CM

## 2015-04-24 DIAGNOSIS — J309 Allergic rhinitis, unspecified: Secondary | ICD-10-CM

## 2015-04-24 DIAGNOSIS — Z5181 Encounter for therapeutic drug level monitoring: Secondary | ICD-10-CM

## 2015-04-24 LAB — POCT INR: INR: 2.9

## 2015-04-24 NOTE — Progress Notes (Signed)
Pre visit review using our clinic review tool, if applicable. No additional management support is needed unless otherwise documented below in the visit note. 

## 2015-04-24 NOTE — Progress Notes (Signed)
I have reviewed and agree with the plan. 

## 2015-05-01 ENCOUNTER — Ambulatory Visit (INDEPENDENT_AMBULATORY_CARE_PROVIDER_SITE_OTHER): Payer: Medicare Other

## 2015-05-01 DIAGNOSIS — J309 Allergic rhinitis, unspecified: Secondary | ICD-10-CM

## 2015-05-08 ENCOUNTER — Ambulatory Visit (INDEPENDENT_AMBULATORY_CARE_PROVIDER_SITE_OTHER): Payer: Medicare Other

## 2015-05-08 DIAGNOSIS — J309 Allergic rhinitis, unspecified: Secondary | ICD-10-CM | POA: Diagnosis not present

## 2015-05-14 ENCOUNTER — Ambulatory Visit: Payer: Medicare Other | Admitting: Adult Health

## 2015-05-14 ENCOUNTER — Ambulatory Visit (INDEPENDENT_AMBULATORY_CARE_PROVIDER_SITE_OTHER): Payer: Medicare Other | Admitting: General Practice

## 2015-05-14 ENCOUNTER — Other Ambulatory Visit: Payer: Self-pay | Admitting: General Practice

## 2015-05-14 ENCOUNTER — Other Ambulatory Visit: Payer: Self-pay | Admitting: Adult Health

## 2015-05-14 DIAGNOSIS — I749 Embolism and thrombosis of unspecified artery: Secondary | ICD-10-CM | POA: Diagnosis not present

## 2015-05-14 DIAGNOSIS — Z5181 Encounter for therapeutic drug level monitoring: Secondary | ICD-10-CM

## 2015-05-14 LAB — POCT INR: INR: 2.5

## 2015-05-14 NOTE — Progress Notes (Signed)
Pre visit review using our clinic review tool, if applicable. No additional management support is needed unless otherwise documented below in the visit note. 

## 2015-05-15 ENCOUNTER — Ambulatory Visit: Payer: Medicare Other

## 2015-05-21 DIAGNOSIS — M1711 Unilateral primary osteoarthritis, right knee: Secondary | ICD-10-CM | POA: Diagnosis not present

## 2015-05-22 ENCOUNTER — Ambulatory Visit (INDEPENDENT_AMBULATORY_CARE_PROVIDER_SITE_OTHER): Payer: Medicare Other

## 2015-05-22 DIAGNOSIS — J309 Allergic rhinitis, unspecified: Secondary | ICD-10-CM | POA: Diagnosis not present

## 2015-05-26 ENCOUNTER — Ambulatory Visit: Payer: Medicare Other | Admitting: Adult Health

## 2015-05-29 ENCOUNTER — Ambulatory Visit (INDEPENDENT_AMBULATORY_CARE_PROVIDER_SITE_OTHER): Payer: Medicare Other

## 2015-05-29 DIAGNOSIS — J309 Allergic rhinitis, unspecified: Secondary | ICD-10-CM

## 2015-05-30 ENCOUNTER — Other Ambulatory Visit: Payer: Self-pay | Admitting: Family Medicine

## 2015-06-01 ENCOUNTER — Other Ambulatory Visit: Payer: Self-pay

## 2015-06-01 MED ORDER — VALSARTAN-HYDROCHLOROTHIAZIDE 320-25 MG PO TABS: 1.0000 | ORAL_TABLET | Freq: Every day | ORAL | Status: DC

## 2015-06-05 ENCOUNTER — Ambulatory Visit (INDEPENDENT_AMBULATORY_CARE_PROVIDER_SITE_OTHER): Payer: Medicare Other

## 2015-06-05 ENCOUNTER — Ambulatory Visit (INDEPENDENT_AMBULATORY_CARE_PROVIDER_SITE_OTHER): Payer: Medicare Other | Admitting: General Practice

## 2015-06-05 DIAGNOSIS — I749 Embolism and thrombosis of unspecified artery: Secondary | ICD-10-CM

## 2015-06-05 DIAGNOSIS — Z5181 Encounter for therapeutic drug level monitoring: Secondary | ICD-10-CM

## 2015-06-05 DIAGNOSIS — J309 Allergic rhinitis, unspecified: Secondary | ICD-10-CM | POA: Diagnosis not present

## 2015-06-05 LAB — POCT INR: INR: 3

## 2015-06-05 NOTE — Progress Notes (Signed)
I have reviewed and agree with the plan. 

## 2015-06-05 NOTE — Progress Notes (Signed)
Pre visit review using our clinic review tool, if applicable. No additional management support is needed unless otherwise documented below in the visit note. 

## 2015-06-09 ENCOUNTER — Ambulatory Visit (INDEPENDENT_AMBULATORY_CARE_PROVIDER_SITE_OTHER): Payer: Medicare Other | Admitting: Adult Health

## 2015-06-09 ENCOUNTER — Encounter: Payer: Self-pay | Admitting: Adult Health

## 2015-06-09 VITALS — BP 152/66 | HR 72 | Resp 20 | Ht 60.0 in | Wt 177.0 lb

## 2015-06-09 DIAGNOSIS — G500 Trigeminal neuralgia: Secondary | ICD-10-CM

## 2015-06-09 NOTE — Patient Instructions (Signed)
If your symptoms worsen or you develop new symptoms please let us know.   

## 2015-06-09 NOTE — Progress Notes (Signed)
I agree with the assessment and plan as directed by NP .The patient is known to me .   Shelah Heatley, MD  

## 2015-06-09 NOTE — Progress Notes (Signed)
PATIENT: [name] [name] DOB: 10/01/1924  REASON FOR VISIT: follow up- trigeminal neuralgia HISTORY FROM: patient  HISTORY OF PRESENT ILLNESS: [name] is an 82 year old female with a history of trigeminal neuralgia. She returns today for follow-up. The patient states that she was unable to tolerate the gabapentin. She states that it made her feel hyperactive she could not sleep or sit still throughout the day. She states that when she stopped the medication her symptoms subsided. She states that she does not have the same discomfort that she had when she was initially diagnosed with trigeminal neuralgia. She states that she used to have pain in the left side of the face and discomfort in her mouth when she would eat food. She states that she no longer has the discomfort in the left side of the face but she still has discomfort when she initially puts food in her mouth. She describes this sensation as feeling as if she is eating something "really cold or really sour like a pickle." She states that this sensation will last for 30 seconds to 1 minute and then it resolves and she is able to eat without difficulty. At this time she feels that the symptoms are manageable and does not want to try any additional medication. She states that since she stopped the carbamazepine her Coumadin levels have remained stable. She denies any new neurological symptoms. She returns today for an evaluation.  HISTORY 01/12/15: [name] is an 82 year old female with a history of trigeminal neuralgia. She returns today for follow-up. The patient states that she ran out of medication about 1 month ago. She was unable to get a refill therefore she tried to go without the medication. She states that the pain on the left side of face has returned. She states that it occurs daily however the pain is not severe like it was in the past. The patient reports that she is also on Coumadin for DVTs. She states that over the last year  she has had a hard time keeping her INR between 2.5-3.5. She is questioning whether she can go back on carbamazepine or another medication. She returns today for an evaluation.  REVIEW OF SYSTEMS: Out of a complete 14 system review of symptoms, the patient complains only of the following symptoms, and all other reviewed systems are negative.  Walking difficulty, environmental allergies, eye itching  ALLERGIES: Allergies  Allergen Reactions  . Sulfa Antibiotics Other (See Comments)    Cold sweat light headed and disorientation  . Tiotropium Bromide Shortness Of Breath and Other (See Comments)    Sore throat also  . Tramadol Nausea And Vomiting  . Latex Itching and Rash    HOME MEDICATIONS: Outpatient Prescriptions Prior to Visit  Medication Sig Dispense Refill  . acetaminophen (TYLENOL) 500 MG tablet Take 500-1,500 mg by mouth 3 (three) times daily as needed for moderate pain.    Marland Kitchen aspirin EC 81 MG tablet Take 81 mg by mouth daily.    . calcium-vitamin D (OSCAL WITH D 500-200) 500-200 MG-UNIT per tablet Take 1 tablet by mouth daily.      . Cholecalciferol (VITAMIN D3) 1000 UNITS CAPS Take 1,000 Units by mouth daily.     Marland Kitchen glucosamine-chondroitin 500-400 MG tablet Take 1 tablet by mouth 2 (two) times daily.     . NONFORMULARY OR COMPOUNDED ITEM Inject 1 mL into the skin once a week. Allergy Vaccine 1:10 Given at [facility] on Friday    . omeprazole (PRILOSEC)  20 MG capsule Take 20 mg by mouth every morning.     . simvastatin (ZOCOR) 40 MG tablet TAKE 1 TABLET (40 MG TOTAL) BY MOUTH DAILY AT 6 PM. 90 tablet 3  . valsartan-hydrochlorothiazide (DIOVAN-HCT) 320-25 MG tablet Take 1 tablet by mouth daily. 90 tablet 1  . warfarin (COUMADIN) 5 MG tablet TAKE 2 & 1/2 TABLETS ON THURSDAY THEN TAKE 2 TABLETS THE REST OF THE DAYS 180 tablet 1  . gabapentin (NEURONTIN) 100 MG capsule TAKE ONE CAPSULE BY MOUTH AT BEDTIME (Patient not taking: Reported on 06/09/2015) 30 capsule 0   No  facility-administered medications prior to visit.    PAST MEDICAL HISTORY: Past Medical History  Diagnosis Date  . GERD (gastroesophageal reflux disease)   . Hyperlipidemia   . History of colonic polyps   . Diverticulosis   . Shingles   . Eczema   . Allergic rhinoconjunctivitis   . Lung nodule 2011  . Environmental allergies     allergy shot- q friday in Dr. Janee Morn office. PFT's abnormal- recommended Spiriva to use preop & will d/c after surgery  . Arthritis     low back , stenosis  . Anxiety     pt. managed- uses deep breathing   . Stenosis of popliteal artery (HCC)     blood clots in legs long ago    . COPD (chronic obstructive pulmonary disease) ([place])   . H/O hiatal hernia   . Hypertension   . Trigeminal neuralgia   . Peripheral vascular disease ([place])   . DVT (deep venous thrombosis) ([place])   . COLONIC POLYPS, RECURRENT 08/29/2006    2008 last colonoscopy. No further colonoscopy.       PAST SURGICAL HISTORY: Past Surgical History  Procedure Laterality Date  . Tubal ligation    . Cholecystectomy  1998  . Femoral-popliteal bypass graft         x2 surgeries 1990's & 2009  . Eye surgery      cataracts removed- bilateral /w IOL  . Tonsillectomy      as a teenager   . Lumbar fusion  07/06/2011  . Abdominal aortagram N/A 06/17/2011    Procedure: ABDOMINAL Maxcine Ham;  Surgeon: Elam Dutch, MD;  Location: [facility] C[facility] LAB;  Service: Cardiovascular;  Laterality: N/A;  . Injection knee Right Aug. 2016    Gel injection for pain    FAMILY HISTORY: Family History  Problem Relation Age of Onset  . Arthritis Mother   . Hypertension Mother   . Heart disease Father   . Colon polyps Father   . Breast cancer Sister   . Lung cancer Sister   . Colon cancer Sister   . Cancer Sister     Breast  . Lung cancer Brother   . Cancer Brother     Lung  . Anesthesia problems Neg Hx   . Hypotension Neg Hx   . Malignant hyperthermia Neg Hx   . Pseudochol deficiency Neg Hx   . Cancer  Daughter     Breast  . Hypertension Son     SOCIAL HISTORY: Social History   Social History  . Marital Status: Single    Spouse Name: N/A  . Number of Children: 4  . Years of Education: 12   Occupational History  .     Social History Main Topics  . Smoking status: Former Smoker -- 2.00 packs/day for 30 years    Types: Cigarettes    Quit date: 06/18/1993  . Smokeless tobacco:  Never Used     Comment: QUIT IN 1995  . Alcohol Use: No  . Drug Use: No  . Sexual Activity: Not on file   Other Topics Concern  . Not on file   Social History Narrative   Patient is widowed with 4 children. 2 grandkids. Lives alone.    Patient is right handed.   Patient has high school education.   Patient drinks 1 cup daily.      Retired from [employer] for 28 years, works 2 days a week until 2016.       Hobbies: volunteers at [employer], [place] from church visits people.             PHYSICAL EXAM  Filed Vitals:   06/09/15 0947  BP: 152/66  Pulse: 72  Resp: 20  Height: 5' (1.524 m)  Weight: 177 lb (80.287 kg)   Body mass index is 34.57 kg/(m^2).  Generalized: Well developed, in no acute distress   Neurological examination  Mentation: Alert oriented to time, place, history taking. Follows all commands speech and language fluent Cranial nerve II-XII: Pupils were equal round reactive to light. Extraocular movements were full, visual field were full on confrontational test. Facial sensation and strength were normal. Uvula tongue midline. Head turning and shoulder shrug  were normal and symmetric. Motor: The motor testing reveals 5 over 5 strength of all 4 extremities. Good symmetric motor tone is noted throughout.  Sensory: Sensory testing is intact to soft touch on all 4 extremities. No evidence of extinction is noted.  Coordination: Cerebellar testing reveals good finger-nose-finger and heel-to-shin bilaterally.  Gait and station: Gait is normal. Tandem gait is  unsteady. Romberg is negative. No drift is seen.  Reflexes: Deep tendon reflexes are symmetric and normal bilaterally.   DIAGNOSTIC DATA (LABS, IMAGING, TESTING) - I reviewed patient records, labs, notes, testing and imaging myself where available.  Lab Results  Component Value Date   WBC 9.6 03/09/2015   HGB 13.2 03/09/2015   HCT 39.5 03/09/2015   MCV 83.5 03/09/2015   PLT 202.0 03/09/2015      Component Value Date/Time   NA 140 03/09/2015 1204   K 4.4 03/09/2015 1204   CL 104 03/09/2015 1204   CO2 28 03/09/2015 1204   GLUCOSE 100* 03/09/2015 1204   BUN 24* 03/09/2015 1204   CREATININE 1.00 03/09/2015 1204   CALCIUM 9.8 03/09/2015 1204   PROT 6.9 03/09/2015 1204   ALBUMIN 4.5 03/09/2015 1204   AST 18 03/09/2015 1204   ALT 17 03/09/2015 1204   ALKPHOS 59 03/09/2015 1204   BILITOT 0.5 03/09/2015 1204   GFRNONAA 52* 03/06/2015 1128   GFRAA 1* 03/06/2015 1128          ASSESSMENT AND PLAN 82 y.o. year old female  has a past medical history of GERD (gastroesophageal reflux disease); Hyperlipidemia; History of colonic polyps; Diverticulosis; Shingles; Eczema; Allergic rhinoconjunctivitis; Lung nodule (2011); Environmental allergies; Arthritis; Anxiety; Stenosis of popliteal artery (HCC); COPD (chronic obstructive pulmonary disease) ([place]); H/O hiatal hernia; Hypertension; Trigeminal neuralgia; Peripheral vascular disease ([place]); DVT (deep venous thrombosis) ([place]); and COLONIC POLYPS, RECURRENT (08/29/2006). here with:  1. Trigeminal neuralgia  At this point the patient's discomfort is very minimal. She does not wish to start any additional medication at this time. We will continue to monitor her symptoms. Patient is advised that if her symptoms return or worsen she should let us know. She will follow-up in 6 months or sooner if needed.  Ward Givens, MSN, NP-C 06/09/2015, 10:31 AM [facility] [address], [place], [place] [address] [phone]

## 2015-06-12 ENCOUNTER — Ambulatory Visit: Payer: Medicare Other

## 2015-06-19 ENCOUNTER — Ambulatory Visit (INDEPENDENT_AMBULATORY_CARE_PROVIDER_SITE_OTHER): Payer: Medicare Other

## 2015-06-19 DIAGNOSIS — J309 Allergic rhinitis, unspecified: Secondary | ICD-10-CM

## 2015-06-26 ENCOUNTER — Ambulatory Visit (INDEPENDENT_AMBULATORY_CARE_PROVIDER_SITE_OTHER): Payer: Medicare Other | Admitting: General Practice

## 2015-06-26 ENCOUNTER — Ambulatory Visit (INDEPENDENT_AMBULATORY_CARE_PROVIDER_SITE_OTHER): Payer: Medicare Other | Admitting: *Deleted

## 2015-06-26 DIAGNOSIS — I749 Embolism and thrombosis of unspecified artery: Secondary | ICD-10-CM

## 2015-06-26 DIAGNOSIS — J309 Allergic rhinitis, unspecified: Secondary | ICD-10-CM

## 2015-06-26 DIAGNOSIS — Z5181 Encounter for therapeutic drug level monitoring: Secondary | ICD-10-CM | POA: Diagnosis not present

## 2015-06-26 LAB — POCT INR: INR: 2.8

## 2015-06-26 NOTE — Progress Notes (Signed)
I have reviewed and agree with the plan. 

## 2015-06-26 NOTE — Progress Notes (Signed)
Pre visit review using our clinic review tool, if applicable. No additional management support is needed unless otherwise documented below in the visit note. 

## 2015-06-29 ENCOUNTER — Encounter: Payer: Self-pay | Admitting: Family

## 2015-07-02 ENCOUNTER — Ambulatory Visit (INDEPENDENT_AMBULATORY_CARE_PROVIDER_SITE_OTHER): Payer: Medicare Other | Admitting: Family

## 2015-07-02 ENCOUNTER — Ambulatory Visit: Payer: Medicare Other | Admitting: Vascular Surgery

## 2015-07-02 ENCOUNTER — Encounter: Payer: Self-pay | Admitting: Family

## 2015-07-02 ENCOUNTER — Other Ambulatory Visit: Payer: Self-pay | Admitting: *Deleted

## 2015-07-02 ENCOUNTER — Ambulatory Visit (INDEPENDENT_AMBULATORY_CARE_PROVIDER_SITE_OTHER)
Admission: RE | Admit: 2015-07-02 | Discharge: 2015-07-02 | Disposition: A | Discharge status: Home / Self Care | Payer: Medicare Other | Source: Ambulatory Visit | Attending: Vascular Surgery | Admitting: Vascular Surgery

## 2015-07-02 ENCOUNTER — Ambulatory Visit (HOSPITAL_COMMUNITY)
Admission: RE | Admit: 2015-07-02 | Discharge: 2015-07-02 | Disposition: A | Discharge status: Home / Self Care | Payer: Medicare Other | Source: Ambulatory Visit | Attending: Vascular Surgery | Admitting: Vascular Surgery

## 2015-07-02 VITALS — BP 126/70 | HR 66 | Ht 60.0 in | Wt 180.3 lb

## 2015-07-02 DIAGNOSIS — I779 Disorder of arteries and arterioles, unspecified: Secondary | ICD-10-CM | POA: Diagnosis not present

## 2015-07-02 DIAGNOSIS — I739 Peripheral vascular disease, unspecified: Secondary | ICD-10-CM

## 2015-07-02 DIAGNOSIS — Z95828 Presence of other vascular implants and grafts: Secondary | ICD-10-CM

## 2015-07-02 DIAGNOSIS — R938 Abnormal findings on diagnostic imaging of other specified body structures: Secondary | ICD-10-CM | POA: Diagnosis not present

## 2015-07-02 DIAGNOSIS — I10 Essential (primary) hypertension: Secondary | ICD-10-CM | POA: Diagnosis not present

## 2015-07-02 DIAGNOSIS — Z48812 Encounter for surgical aftercare following surgery on the circulatory system: Secondary | ICD-10-CM | POA: Diagnosis not present

## 2015-07-02 DIAGNOSIS — Z87891 Personal history of nicotine dependence: Secondary | ICD-10-CM | POA: Diagnosis not present

## 2015-07-02 DIAGNOSIS — E785 Hyperlipidemia, unspecified: Secondary | ICD-10-CM | POA: Insufficient documentation

## 2015-07-02 DIAGNOSIS — R0989 Other specified symptoms and signs involving the circulatory and respiratory systems: Secondary | ICD-10-CM | POA: Insufficient documentation

## 2015-07-02 NOTE — Patient Instructions (Signed)
Peripheral Vascular Disease Peripheral vascular disease (PVD) is a disease of the blood vessels that are not part of your heart and brain. A simple term for PVD is poor circulation. In most cases, PVD narrows the blood vessels that carry blood from your heart to the rest of your body. This can result in a decreased supply of blood to your arms, legs, and internal organs, like your stomach or kidneys. However, it most often affects a person's lower legs and feet. There are two types of PVD.  Organic PVD. This is the more common type. It is caused by damage to the structure of blood vessels.  Functional PVD. This is caused by conditions that make blood vessels contract and tighten (spasm). Without treatment, PVD tends to get worse over time. PVD can also lead to acute ischemic limb. This is when an arm or limb suddenly has trouble getting enough blood. This is a medical emergency. CAUSES Each type of PVD has many different causes. The most common cause of PVD is buildup of a fatty material (plaque) inside of your arteries (atherosclerosis). Small amounts of plaque can break off from the walls of the blood vessels and become lodged in a smaller artery. This blocks blood flow and can cause acute ischemic limb. Other common causes of PVD include:  Blood clots that form inside of blood vessels.  Injuries to blood vessels.  Diseases that cause inflammation of blood vessels or cause blood vessel spasms.  Health behaviors and health history that increase your risk of developing PVD. RISK FACTORS  You may have a greater risk of PVD if you:  Have a family history of PVD.  Have certain medical conditions, including:  High cholesterol.  Diabetes.  High blood pressure (hypertension).  Coronary heart disease.  Past problems with blood clots.  Past injury, such as burns or a broken bone. These may have damaged blood vessels in your limbs.  Buerger disease. This is caused by inflamed blood  vessels in your hands and feet.  Some forms of arthritis.  Rare birth defects that affect the arteries in your legs.  Use tobacco.  Do not get enough exercise.  Are obese.  Are age 50 or older. SIGNS AND SYMPTOMS  PVD may cause many different symptoms. Your symptoms depend on what part of your body is not getting enough blood. Some common signs and symptoms include:  Cramps in your lower legs. This may be a symptom of poor leg circulation (claudication).  Pain and weakness in your legs while you are physically active that goes away when you rest (intermittent claudication).  Leg pain when at rest.  Leg numbness, tingling, or weakness.  Coldness in a leg or foot, especially when compared with the other leg.  Skin or hair changes. These can include:  Hair loss.  Shiny skin.  Pale or bluish skin.  Thick toenails.  Inability to get or maintain an erection (erectile dysfunction). People with PVD are more prone to developing ulcers and sores on their toes, feet, or legs. These may take longer than normal to heal. DIAGNOSIS Your health care provider may diagnose PVD from your signs and symptoms. The health care provider will also do a physical exam. You may have tests to find out what is causing your PVD and determine its severity. Tests may include:  Blood pressure recordings from your arms and legs and measurements of the strength of your pulses (pulse volume recordings).  Imaging studies using sound waves to take pictures of   the blood flow through your blood vessels (Doppler ultrasound).  Injecting a dye into your blood vessels before having imaging studies using:  X-rays (angiogram or arteriogram).  Computer-generated X-rays (CT angiogram).  A powerful electromagnetic field and a computer (magnetic resonance angiogram or MRA). TREATMENT Treatment for PVD depends on the cause of your condition and the severity of your symptoms. It also depends on your age. Underlying  causes need to be treated and controlled. These include long-lasting (chronic) conditions, such as diabetes, high cholesterol, and high blood pressure. You may need to first try making lifestyle changes and taking medicines. Surgery may be needed if these do not work. Lifestyle changes may include:  Quitting smoking.  Exercising regularly.  Following a low-fat, low-cholesterol diet. Medicines may include:  Blood thinners to prevent blood clots.  Medicines to improve blood flow.  Medicines to improve your blood cholesterol levels. Surgical procedures may include:  A procedure that uses an inflated balloon to open a blocked artery and improve blood flow (angioplasty).  A procedure to put in a tube (stent) to keep a blocked artery open (stent implant).  Surgery to reroute blood flow around a blocked artery (peripheral bypass surgery).  Surgery to remove dead tissue from an infected wound on the affected limb.  Amputation. This is surgical removal of the affected limb. This may be necessary in cases of acute ischemic limb that are not improved through medical or surgical treatments. HOME CARE INSTRUCTIONS  Take medicines only as directed by your health care provider.  Do not use any tobacco products, including cigarettes, chewing tobacco, or electronic cigarettes. If you need help quitting, ask your health care provider.  Lose weight if you are overweight, and maintain a healthy weight as directed by your health care provider.  Eat a diet that is low in fat and cholesterol. If you need help, ask your health care provider.  Exercise regularly. Ask your health care provider to suggest some good activities for you.  Use compression stockings or other mechanical devices as directed by your health care provider.  Take good care of your feet.  Wear comfortable shoes that fit well.  Check your feet often for any cuts or sores. SEEK MEDICAL CARE IF:  You have cramps in your legs  while walking.  You have leg pain when you are at rest.  You have coldness in a leg or foot.  Your skin changes.  You have erectile dysfunction.  You have cuts or sores on your feet that are not healing. SEEK IMMEDIATE MEDICAL CARE IF:  Your arm or leg turns cold and blue.  Your arms or legs become red, warm, swollen, painful, or numb.  You have chest pain or trouble breathing.  You suddenly have weakness in your face, arm, or leg.  You become very confused or lose the ability to speak.  You suddenly have a very bad headache or lose your vision.   This information is not intended to replace advice given to you by your health care provider. Make sure you discuss any questions you have with your health care provider.   Document Released: 05/19/2004 Document Revised: 05/02/2014 Document Reviewed: 09/19/2013 Elsevier Interactive Patient Education 2016 [employer]  

## 2015-07-02 NOTE — Progress Notes (Signed)
[facility] [place] HISTORY AND PHYSICAL -PAD  History of Present Illness [name] [name] is a 82 y.o. female patient of Dr. Oneida Alar who is status post bilateral femoropopliteal bypass with [name] [phone] and 2009), she's also had thrombolysis angioplasty of the right femoropopliteal bypass in 2010, again on 10/13/13, and thrombectomy angioplasty right interpositional graft from above the knee to below the knee popliteal artery in 2010.  She returns today for follow up.  On 10/13/13 she underwent arteriogram by interventional radiology at [facility] of her right lower extremity. Her arteriogram revealed an occluded right femoral to below-knee popliteal bypass graft. Lysis was initiated through the left common femoral artery with a 55F sheath. Following her procedure, she was placed on a heparin drip and started on coumadin. Her left femoral sheath site had no active bleeding or hematoma. She had palpable dorsalis pedis pulses. She stayed in the hospital for an additional day after her lysis intervention due to deconditioning. After working with physical therapy, she was recommended to go home with a rolling walker. The remainder of the hospital course consisted of increasing mobilization and increasing intake of solids without difficulty. She was discharged home with home health.   Her walking is limited by low back pain, she has had lumbar fusion in March, 2013, states she was told that she has arthritis in her back which is causing this pain.  She has been having pain in her right knee since 2016 which also limits her walking.   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   Pt states her blood pressure usually runs 130-140/60's, states it goes up in a medical office.  She no longer has claudication symptoms with walking since the above vascular procedures, she denies non healing wounds.  She denies any history of stroke or TIA symptoms, denies cardiac problems.  She has been making a  concerted effort to lose weight.  The patient denies New Medical or Surgical History.   Pt Diabetic: No  Pt smoker: former smoker, quit about 1995  Pt meds include:  Statin :Yes  ASA: Yes  Other anticoagulants/antiplatelets: coumadin started by Dr. Oneida Alar July, 2015, and chronically managed by her PCP, was Dr. Arnoldo Morale, then Dr. Yong Channel   Past Medical History  Diagnosis Date  . GERD (gastroesophageal reflux disease)   . Hyperlipidemia   . History of colonic polyps   . Diverticulosis   . Shingles   . Eczema   . Allergic rhinoconjunctivitis   . Lung nodule 2011  . Environmental allergies     allergy shot- q friday in Dr. Janee Morn office. PFT's abnormal- recommended Spiriva to use preop & will d/c after surgery  . Arthritis     low back , stenosis  . Anxiety     pt. managed- uses deep breathing   . Stenosis of popliteal artery (HCC)     blood clots in legs long ago    . COPD (chronic obstructive pulmonary disease) ([place])   . H/O hiatal hernia   . Hypertension   . Trigeminal neuralgia   . Peripheral vascular disease ([place])   . DVT (deep venous thrombosis) ([place])   . COLONIC POLYPS, RECURRENT 08/29/2006    2008 last colonoscopy. No further colonoscopy.       Social History Social History  Substance Use Topics  . Smoking status: Former Smoker -- 2.00 packs/day for 30 years    Types: Cigarettes    Quit date: 06/18/1993  . Smokeless tobacco: Never Used  Comment: [place]  . Alcohol Use: No    Family History Family History  Problem Relation Age of Onset  . Arthritis Mother   . Hypertension Mother   . Heart disease Father   . Colon polyps Father   . Breast cancer Sister   . Lung cancer Sister   . Colon cancer Sister   . Cancer Sister     Breast  . Lung cancer Brother   . Cancer Brother     Lung  . Anesthesia problems Neg Hx   . Hypotension Neg Hx   . Malignant hyperthermia Neg Hx   . Pseudochol deficiency Neg Hx   . Cancer Daughter     Breast  .  Hypertension Son     Past Surgical History  Procedure Laterality Date  . Tubal ligation    . Cholecystectomy  1998  . Femoral-popliteal bypass graft         x2 surgeries 1990's & 2009  . Eye surgery      cataracts removed- bilateral /w IOL  . Tonsillectomy      as a teenager   . Lumbar fusion  07/06/2011  . Abdominal aortagram N/A 06/17/2011    Procedure: ABDOMINAL Maxcine Ham;  Surgeon: Elam Dutch, MD;  Location: [facility] CATH LAB;  Service: Cardiovascular;  Laterality: N/A;  . Injection knee Right Aug. 2016    Gel injection for pain    Allergies  Allergen Reactions  . Sulfa Antibiotics Other (See Comments)    Cold sweat light headed and disorientation  . Tiotropium Bromide Shortness Of Breath and Other (See Comments)    Sore throat also  . Tramadol Nausea And Vomiting  . Latex Itching and Rash    Current Outpatient Prescriptions  Medication Sig Dispense Refill  . acetaminophen (TYLENOL) 500 MG tablet Take 500-1,500 mg by mouth 3 (three) times daily as needed for moderate pain.    Marland Kitchen aspirin EC 81 MG tablet Take 81 mg by mouth daily.    . calcium-vitamin D (OSCAL WITH D 500-200) 500-200 MG-UNIT per tablet Take 1 tablet by mouth daily.      . Cholecalciferol (VITAMIN D3) 1000 UNITS CAPS Take 1,000 Units by mouth daily.     Marland Kitchen glucosamine-chondroitin 500-400 MG tablet Take 1 tablet by mouth 2 (two) times daily.     . NONFORMULARY OR COMPOUNDED ITEM Inject 1 mL into the skin once a week. Allergy Vaccine 1:10 Given at [facility] on Friday    . omeprazole (PRILOSEC) 20 MG capsule Take 20 mg by mouth every morning.     . simvastatin (ZOCOR) 40 MG tablet TAKE 1 TABLET (40 MG TOTAL) BY MOUTH DAILY AT 6 PM. 90 tablet 3  . valsartan-hydrochlorothiazide (DIOVAN-HCT) 320-25 MG tablet Take 1 tablet by mouth daily. 90 tablet 1  . warfarin (COUMADIN) 5 MG tablet TAKE 2 & 1/2 TABLETS ON THURSDAY THEN TAKE 2 TABLETS THE REST OF THE DAYS 180 tablet 1  . gabapentin (NEURONTIN) 100 MG  capsule TAKE ONE CAPSULE BY MOUTH AT BEDTIME (Patient not taking: Reported on 06/09/2015) 30 capsule 0   No current facility-administered medications for this visit.    ROS: See HPI for pertinent positives and negatives.   Physical Examination  Filed Vitals:   07/02/15 1412  BP: 126/70  Pulse: 66  Height: 5' (1.524 m)  Weight: 180 lb 4.8 oz (81.784 kg)  SpO2: 97%     Body mass index is 35.21 kg/(m^2).  General: A&O x 3, WDWN,  obese female.  Gait: normal  Eyes: PERRLA.  Pulmonary: CTAB, without wheezes , rales or rhonchi.  Cardiac: regular rhythm, no detected murmur.   Carotid Bruits  Left  Right    Negative  Negative    Aorta is not palpable.  Radial pulses: 2+ palpable and =.   VASCULAR EXAM:  Extremities without ischemic changes  without Gangrene; without open wounds.   LE Pulses  LEFT  RIGHT   FEMORAL  not palpable (obese) not palpable (obese)  POPLITEAL  not palpable  not palpable   POSTERIOR TIBIAL  not palpable  not palpable   DORSALIS PEDIS  ANTERIOR TIBIAL  1+palpable  2+palpable    Abdomen: soft, NT, no masses palpated.  Skin: no rashes, no ulcers.  Musculoskeletal: no muscle wasting or atrophy.  Neurologic: A&O X 3; Appropriate Affect ; SENSATION: normal; MOTOR FUNCTION: moving all extremities equally, motor strength 5/5 throughout. Speech is fluent/normal. CN 2-12 intact.             Non-Invasive Vascular Imaging: DATE: 07/02/2015  Bilateral Lower Extremity Arterial Duplex: Bilateral lower extremity bypass grafts with no evidence of internal stenosis. Possible greater than 50% stenosis of the left proximal CFA. Elevated velocity in the right proximal anastomosis which appears to be due to the angle of take off. No significant change compared to the exams of 12/25/14 and 06/12/14.  ABI   R: 0.90 (0.93, 12/25/14), DP: triphasic, PT: triphasic, TBI: 0.61  L: 0.92 (0.97), DP: triphasic, PT: biphasic, TBI: 0.69      ASSESSMENT: [name] [name] is a 82 y.o. female who is status post bilateral femoropopliteal bypass with [name] [phone] and 2009), she's also had thrombolysis angioplasty of the right femoropopliteal bypass in 2010, again on 10/13/13,and thrombectomy angioplasty right interpositional graft from above the knee to below the knee popliteal artery in 2010.  She no longer has claudication symptoms with walking since the above vascular procedures, but she probably does not walk very much since her walking is limited by back pain and right knee pain. She has no signs of ischemia in her feet/legs.  Today's bilateral LE arterial duplex suggests bilateral lower extremity bypass grafts with no evidence of internal stenosis. Possible greater than 50% stenosis of the left proximal CFA. Elevated velocity in the right proximal anastomosis which appears to be due to the angle of take off. No significant change compared to the exams of 12/25/14 and 06/12/14.  ABI's are slightly decreased in both legs; mild arterial occlusive disease bilaterally with mostly triphasic waveforms.    PLAN:  Discussed daily seated leg exercises since she is unable to participate in a graduated walking program.  Based on the patient's vascular studies and examination, pt will return to clinic in 6 months with ABI's and bilateral LE arterial duplex.   I discussed in depth with the patient the nature of atherosclerosis, and emphasized the importance of maximal medical management including strict control of blood pressure, blood glucose, and lipid levels, obtaining regular exercise, and continued cessation of smoking.  The patient is aware that without maximal medical management the underlying atherosclerotic disease process will progress, limiting the benefit of any interventions.  The patient was given information about PAD including signs, symptoms, treatment, what symptoms should prompt the patient to seek immediate medical care,  and risk reduction measures to take.  Clemon Chambers, RN, MSN, FNP-C [facility] Phone: [phone]  Clinic MD: Oneida Alar  07/02/2015 2:14 PM

## 2015-07-03 ENCOUNTER — Ambulatory Visit (INDEPENDENT_AMBULATORY_CARE_PROVIDER_SITE_OTHER): Payer: Medicare Other | Admitting: *Deleted

## 2015-07-03 DIAGNOSIS — J309 Allergic rhinitis, unspecified: Secondary | ICD-10-CM | POA: Diagnosis not present

## 2015-07-10 ENCOUNTER — Ambulatory Visit (INDEPENDENT_AMBULATORY_CARE_PROVIDER_SITE_OTHER): Payer: Medicare Other

## 2015-07-10 DIAGNOSIS — J309 Allergic rhinitis, unspecified: Secondary | ICD-10-CM | POA: Diagnosis not present

## 2015-07-14 ENCOUNTER — Telehealth: Payer: Self-pay | Admitting: Internal Medicine

## 2015-07-14 ENCOUNTER — Encounter: Payer: Self-pay | Admitting: *Deleted

## 2015-07-14 DIAGNOSIS — J309 Allergic rhinitis, unspecified: Secondary | ICD-10-CM | POA: Diagnosis not present

## 2015-07-14 NOTE — Telephone Encounter (Signed)
Allergy Serum Extract Date Mixed: 07/14/15 Vial: 2 Strength: 1:10 Here/Mail/Pick Up: here Mixed By: tbs Last OV: 12/18/15 Pending OV: 12/21/15

## 2015-07-17 ENCOUNTER — Ambulatory Visit (INDEPENDENT_AMBULATORY_CARE_PROVIDER_SITE_OTHER): Payer: Medicare Other

## 2015-07-17 ENCOUNTER — Ambulatory Visit (INDEPENDENT_AMBULATORY_CARE_PROVIDER_SITE_OTHER): Payer: Medicare Other | Admitting: General Practice

## 2015-07-17 DIAGNOSIS — J309 Allergic rhinitis, unspecified: Secondary | ICD-10-CM | POA: Diagnosis not present

## 2015-07-17 DIAGNOSIS — I749 Embolism and thrombosis of unspecified artery: Secondary | ICD-10-CM | POA: Diagnosis not present

## 2015-07-17 DIAGNOSIS — Z5181 Encounter for therapeutic drug level monitoring: Secondary | ICD-10-CM

## 2015-07-17 LAB — POCT INR: INR: 2.5

## 2015-07-17 NOTE — Progress Notes (Signed)
Pre visit review using our clinic review tool, if applicable. No additional management support is needed unless otherwise documented below in the visit note. 

## 2015-07-18 NOTE — Progress Notes (Signed)
I have reviewed and agree with the plan. 

## 2015-07-24 ENCOUNTER — Ambulatory Visit (INDEPENDENT_AMBULATORY_CARE_PROVIDER_SITE_OTHER): Payer: Medicare Other | Admitting: *Deleted

## 2015-07-24 DIAGNOSIS — J309 Allergic rhinitis, unspecified: Secondary | ICD-10-CM

## 2015-07-31 ENCOUNTER — Ambulatory Visit (INDEPENDENT_AMBULATORY_CARE_PROVIDER_SITE_OTHER): Payer: Medicare Other

## 2015-07-31 DIAGNOSIS — J309 Allergic rhinitis, unspecified: Secondary | ICD-10-CM | POA: Diagnosis not present

## 2015-08-07 ENCOUNTER — Ambulatory Visit: Payer: Medicare Other

## 2015-08-11 ENCOUNTER — Ambulatory Visit (INDEPENDENT_AMBULATORY_CARE_PROVIDER_SITE_OTHER): Payer: Medicare Other | Admitting: Family Medicine

## 2015-08-11 ENCOUNTER — Encounter: Payer: Self-pay | Admitting: Family Medicine

## 2015-08-11 VITALS — BP 128/68 | HR 67 | Temp 98.3°F | Wt 184.0 lb

## 2015-08-11 DIAGNOSIS — I779 Disorder of arteries and arterioles, unspecified: Secondary | ICD-10-CM | POA: Diagnosis not present

## 2015-08-11 DIAGNOSIS — M25561 Pain in right knee: Secondary | ICD-10-CM

## 2015-08-11 MED ORDER — NORTRIPTYLINE HCL 10 MG PO CAPS: 10.0000 mg | ORAL_CAPSULE | Freq: Every day | ORAL | Status: DC

## 2015-08-11 NOTE — Progress Notes (Addendum)
[name] Reddish, MD  Subjective:  [name] [name] is a 82 y.o. year old very pleasant female patient who presents for/with See problem oriented charting ROS- some low back pain, no numbness or tingling down into foot. No falls or weakness.   Past Medical History-  Patient Active Problem List   Diagnosis Date Noted  . Pain in joint, lower leg 08/28/2014    Priority: High  . Peripheral arterial disease ([place]) 06/29/2007    Priority: High  . Trigeminal neuralgia     Priority: Medium  . COPD mixed type ([place]) 01/22/2010    Priority: Medium  . Hyperglycemia 01/06/2009    Priority: Medium  . Hyperlipidemia 10/23/2006    Priority: Medium  . Essential hypertension 10/23/2006    Priority: Medium  . ESOPHAGEAL STRICTURE 08/09/1993    Priority: Medium  . Encounter for therapeutic drug monitoring 10/21/2013    Priority: Low  . Syncope 12/29/2011    Priority: Low  . Allergic rhinitis due to pollen 06/25/2010    Priority: Low  . Solitary pulmonary nodule 01/12/2010    Priority: Low  . Osteopenia 01/06/2009    Priority: Low  . ECZEMA 11/26/2007    Priority: Low    Medications- reviewed and updated Current Outpatient Prescriptions  Medication Sig Dispense Refill  . acetaminophen (TYLENOL) 500 MG tablet Take 500-1,500 mg by mouth 3 (three) times daily as needed for moderate pain.    Marland Kitchen aspirin EC 81 MG tablet Take 81 mg by mouth daily.    . calcium-vitamin D (OSCAL WITH D 500-200) 500-200 MG-UNIT per tablet Take 1 tablet by mouth daily.      . Cholecalciferol (VITAMIN D3) 1000 UNITS CAPS Take 1,000 Units by mouth daily.     Marland Kitchen glucosamine-chondroitin 500-400 MG tablet Take 1 tablet by mouth 2 (two) times daily.     . NONFORMULARY OR COMPOUNDED ITEM Inject 1 mL into the skin once a week. Allergy Vaccine 1:10 Given at [facility] on Friday    . omeprazole (PRILOSEC) 20 MG capsule Take 20 mg by mouth every morning.     . simvastatin (ZOCOR) 40 MG tablet TAKE 1 TABLET (40 MG TOTAL) BY  MOUTH DAILY AT 6 PM. 90 tablet 3  . valsartan-hydrochlorothiazide (DIOVAN-HCT) 320-25 MG tablet Take 1 tablet by mouth daily. 90 tablet 1  . warfarin (COUMADIN) 5 MG tablet TAKE 2 & 1/2 TABLETS ON THURSDAY THEN TAKE 2 TABLETS THE REST OF THE DAYS 180 tablet 1  . nortriptyline (PAMELOR) 10 MG capsule Take 1 capsule (10 mg total) by mouth at bedtime. 30 capsule 3   No current facility-administered medications for this visit.    Objective: BP 128/68 mmHg  Pulse 67  Temp(Src) 98.3 F (36.8 C)  Wt 184 lb (83.462 kg) Gen: NAD, resting comfortably CV: RRR no murmurs rubs or gallops Lungs: CTAB no crackles, wheeze, rhonchi Abdomen: soft/nontender/nondistended/normal bowel sounds. No rebound or guarding.  Ext: no edema Pain on medial joint line on right lower leg Skin: warm, dry, no rash over knee Neuro: grossly normal, moves all extremities   Assessment/Plan:  Pain in joint, lower leg S: Right leg. Extensive workup by ortho Dr. [name] and Dr. Oneida Alar of vascular.  Injection with gel helped on lateral side. Left inner knee still hurts. Wakes up in the middle of the night with pain. Falls asleep in day due to poor sleep. Pain up to 8-9/10. Better when she walks on it, better in the day. Blue EMU spray gives temporary relief.  Aspercreme does not seem to help. Has a history of some low back pain in the past but now that leg is bothering her- arthritis in back now worsening. Very sensitive to touch even with sheet. Dr. [name] has done injection as above but thought may be tendonitis.burning sensation. Seems to be getting worse instead of better. Doing exercises that she has been shown in therapy.   Failed- gabapentin (wound up), hydrocodone and oxycodone (lightheaded), tramadol (sick to stomach) A/P: counseling provided extensively due to stress of managing this pain. We opted for low dose nortriptyline at 10mg  with follow up in few weeks. We will update labs at that time as well.    Has follow up within  1 month  Meds ordered this encounter  Medications  . nortriptyline (PAMELOR) 10 MG capsule    Sig: Take 1 capsule (10 mg total) by mouth at bedtime.    Dispense:  30 capsule    Refill:  3   The duration of face-to-face time during this visit was 25 minutes. Greater than 50% of this time was spent in counseling, explanation of diagnosis, planning of further management, and/or coordination of care.

## 2015-08-11 NOTE — Patient Instructions (Signed)
Let's use a very low dose of nortriptyline at 10mg  to see if that will help with your pain in your leg and help you sleep better.   No changes to medications otherwise  When you follow up in a few weeks, come in fasting so I can update your bloodwork.   If I can't get you feeling better- consider pain management referral for their opinion  Check with your insurance about coverage for acupuncture and who they use locally- I am happy to provide a referral

## 2015-08-11 NOTE — Assessment & Plan Note (Addendum)
S: Right leg. Extensive workup by ortho Dr. [name] and Dr. Oneida Alar of vascular.  Injection with gel helped on lateral side. Left inner knee still hurts. Wakes up in the middle of the night with pain. Falls asleep in day due to poor sleep. Pain up to 8-9/10. Better when she walks on it, better in the day. Blue EMU spray gives temporary relief. Aspercreme does not seem to help. Has a history of some low back pain in the past but now that leg is bothering her- arthritis in back now worsening. Very sensitive to touch even with sheet. Dr. [name] has done injection as above but thought may be tendonitis.burning sensation. Seems to be getting worse instead of better. Doing exercises that she has been shown in therapy.   Failed- gabapentin (wound up), hydrocodone and oxycodone (lightheaded), tramadol (sick to stomach) A/P: counseling provided extensively due to stress of managing this pain. We opted for low dose nortriptyline at 10mg  with follow up in few weeks. We will update labs at that time as well.

## 2015-08-14 ENCOUNTER — Ambulatory Visit (INDEPENDENT_AMBULATORY_CARE_PROVIDER_SITE_OTHER): Payer: Medicare Other

## 2015-08-14 ENCOUNTER — Ambulatory Visit (INDEPENDENT_AMBULATORY_CARE_PROVIDER_SITE_OTHER): Payer: Medicare Other | Admitting: General Practice

## 2015-08-14 DIAGNOSIS — J309 Allergic rhinitis, unspecified: Secondary | ICD-10-CM

## 2015-08-14 DIAGNOSIS — I749 Embolism and thrombosis of unspecified artery: Secondary | ICD-10-CM

## 2015-08-14 DIAGNOSIS — Z5181 Encounter for therapeutic drug level monitoring: Secondary | ICD-10-CM

## 2015-08-14 LAB — POCT INR: INR: 3.4

## 2015-08-14 NOTE — Progress Notes (Signed)
Pre visit review using our clinic review tool, if applicable. No additional management support is needed unless otherwise documented below in the visit note. 

## 2015-08-14 NOTE — Progress Notes (Signed)
I have reviewed and agree with the plan. 

## 2015-08-19 DIAGNOSIS — M4716 Other spondylosis with myelopathy, lumbar region: Secondary | ICD-10-CM | POA: Diagnosis not present

## 2015-08-19 DIAGNOSIS — M5431 Sciatica, right side: Secondary | ICD-10-CM | POA: Diagnosis not present

## 2015-08-19 DIAGNOSIS — M9905 Segmental and somatic dysfunction of pelvic region: Secondary | ICD-10-CM | POA: Diagnosis not present

## 2015-08-19 DIAGNOSIS — M4604 Spinal enthesopathy, thoracic region: Secondary | ICD-10-CM | POA: Diagnosis not present

## 2015-08-19 DIAGNOSIS — M9902 Segmental and somatic dysfunction of thoracic region: Secondary | ICD-10-CM | POA: Diagnosis not present

## 2015-08-19 DIAGNOSIS — M9903 Segmental and somatic dysfunction of lumbar region: Secondary | ICD-10-CM | POA: Diagnosis not present

## 2015-08-21 ENCOUNTER — Ambulatory Visit (INDEPENDENT_AMBULATORY_CARE_PROVIDER_SITE_OTHER): Payer: Medicare Other | Admitting: *Deleted

## 2015-08-21 DIAGNOSIS — M4716 Other spondylosis with myelopathy, lumbar region: Secondary | ICD-10-CM | POA: Diagnosis not present

## 2015-08-21 DIAGNOSIS — J309 Allergic rhinitis, unspecified: Secondary | ICD-10-CM | POA: Diagnosis not present

## 2015-08-21 DIAGNOSIS — M9902 Segmental and somatic dysfunction of thoracic region: Secondary | ICD-10-CM | POA: Diagnosis not present

## 2015-08-21 DIAGNOSIS — M5431 Sciatica, right side: Secondary | ICD-10-CM | POA: Diagnosis not present

## 2015-08-21 DIAGNOSIS — M4604 Spinal enthesopathy, thoracic region: Secondary | ICD-10-CM | POA: Diagnosis not present

## 2015-08-21 DIAGNOSIS — M9905 Segmental and somatic dysfunction of pelvic region: Secondary | ICD-10-CM | POA: Diagnosis not present

## 2015-08-21 DIAGNOSIS — M9903 Segmental and somatic dysfunction of lumbar region: Secondary | ICD-10-CM | POA: Diagnosis not present

## 2015-08-26 DIAGNOSIS — M5431 Sciatica, right side: Secondary | ICD-10-CM | POA: Diagnosis not present

## 2015-08-26 DIAGNOSIS — M9902 Segmental and somatic dysfunction of thoracic region: Secondary | ICD-10-CM | POA: Diagnosis not present

## 2015-08-26 DIAGNOSIS — M4716 Other spondylosis with myelopathy, lumbar region: Secondary | ICD-10-CM | POA: Diagnosis not present

## 2015-08-26 DIAGNOSIS — M9903 Segmental and somatic dysfunction of lumbar region: Secondary | ICD-10-CM | POA: Diagnosis not present

## 2015-08-26 DIAGNOSIS — M9905 Segmental and somatic dysfunction of pelvic region: Secondary | ICD-10-CM | POA: Diagnosis not present

## 2015-08-26 DIAGNOSIS — M4604 Spinal enthesopathy, thoracic region: Secondary | ICD-10-CM | POA: Diagnosis not present

## 2015-08-27 DIAGNOSIS — H02831 Dermatochalasis of right upper eyelid: Secondary | ICD-10-CM | POA: Diagnosis not present

## 2015-08-27 DIAGNOSIS — H40053 Ocular hypertension, bilateral: Secondary | ICD-10-CM | POA: Diagnosis not present

## 2015-08-27 DIAGNOSIS — H10413 Chronic giant papillary conjunctivitis, bilateral: Secondary | ICD-10-CM | POA: Diagnosis not present

## 2015-08-27 DIAGNOSIS — H04123 Dry eye syndrome of bilateral lacrimal glands: Secondary | ICD-10-CM | POA: Diagnosis not present

## 2015-08-27 DIAGNOSIS — H04413 Chronic dacryocystitis of bilateral lacrimal passages: Secondary | ICD-10-CM | POA: Diagnosis not present

## 2015-08-27 DIAGNOSIS — H02834 Dermatochalasis of left upper eyelid: Secondary | ICD-10-CM | POA: Diagnosis not present

## 2015-08-27 DIAGNOSIS — H43812 Vitreous degeneration, left eye: Secondary | ICD-10-CM | POA: Diagnosis not present

## 2015-08-27 DIAGNOSIS — Z961 Presence of intraocular lens: Secondary | ICD-10-CM | POA: Diagnosis not present

## 2015-08-28 ENCOUNTER — Ambulatory Visit (INDEPENDENT_AMBULATORY_CARE_PROVIDER_SITE_OTHER): Payer: Medicare Other

## 2015-08-28 DIAGNOSIS — J309 Allergic rhinitis, unspecified: Secondary | ICD-10-CM | POA: Diagnosis not present

## 2015-08-28 DIAGNOSIS — M9903 Segmental and somatic dysfunction of lumbar region: Secondary | ICD-10-CM | POA: Diagnosis not present

## 2015-08-28 DIAGNOSIS — M9905 Segmental and somatic dysfunction of pelvic region: Secondary | ICD-10-CM | POA: Diagnosis not present

## 2015-08-28 DIAGNOSIS — M9902 Segmental and somatic dysfunction of thoracic region: Secondary | ICD-10-CM | POA: Diagnosis not present

## 2015-08-28 DIAGNOSIS — M5431 Sciatica, right side: Secondary | ICD-10-CM | POA: Diagnosis not present

## 2015-08-28 DIAGNOSIS — M4716 Other spondylosis with myelopathy, lumbar region: Secondary | ICD-10-CM | POA: Diagnosis not present

## 2015-08-28 DIAGNOSIS — M4604 Spinal enthesopathy, thoracic region: Secondary | ICD-10-CM | POA: Diagnosis not present

## 2015-08-31 ENCOUNTER — Encounter: Payer: Self-pay | Admitting: Family Medicine

## 2015-08-31 ENCOUNTER — Ambulatory Visit (INDEPENDENT_AMBULATORY_CARE_PROVIDER_SITE_OTHER): Payer: Medicare Other | Admitting: Family Medicine

## 2015-08-31 VITALS — BP 130/74 | HR 71 | Temp 97.5°F | Ht 60.0 in | Wt 181.0 lb

## 2015-08-31 DIAGNOSIS — I779 Disorder of arteries and arterioles, unspecified: Secondary | ICD-10-CM | POA: Diagnosis not present

## 2015-08-31 DIAGNOSIS — Z23 Encounter for immunization: Secondary | ICD-10-CM | POA: Diagnosis not present

## 2015-08-31 DIAGNOSIS — E785 Hyperlipidemia, unspecified: Secondary | ICD-10-CM | POA: Diagnosis not present

## 2015-08-31 DIAGNOSIS — J449 Chronic obstructive pulmonary disease, unspecified: Secondary | ICD-10-CM

## 2015-08-31 DIAGNOSIS — I10 Essential (primary) hypertension: Secondary | ICD-10-CM

## 2015-08-31 DIAGNOSIS — R739 Hyperglycemia, unspecified: Secondary | ICD-10-CM

## 2015-08-31 DIAGNOSIS — Z0001 Encounter for general adult medical examination with abnormal findings: Secondary | ICD-10-CM

## 2015-08-31 DIAGNOSIS — I739 Peripheral vascular disease, unspecified: Secondary | ICD-10-CM

## 2015-08-31 DIAGNOSIS — R300 Dysuria: Secondary | ICD-10-CM

## 2015-08-31 DIAGNOSIS — M25561 Pain in right knee: Secondary | ICD-10-CM

## 2015-08-31 LAB — POC URINALSYSI DIPSTICK (AUTOMATED)
Glucose, UA: NEGATIVE
NITRITE UA: NEGATIVE
PH UA: 5
RBC UA: NEGATIVE
SPEC GRAV UA: 1.025
UROBILINOGEN UA: 1

## 2015-08-31 NOTE — Patient Instructions (Addendum)
[name] [name] , Thank you for taking time to come for your Medicare Wellness Visit. I appreciate your ongoing commitment to your health goals. Please review the following plan we discussed and let me know if I can assist you in the future.   These are the goals we discussed: 1. Immunizations keep updated-  [id number] received today. Declined Tetanus- if you get a cut or scrape get in to see [place] for this 2. Do your leg exercises on a regular basis  This is a list of the screening recommended for you and due dates:  Health Maintenance  Topic Date Due  . Tetanus Vaccine  04/26/2015  . Flu Shot  11/24/2015  . DEXA scan (bone density measurement)  Completed  . Shingles Vaccine  Completed  . Pneumonia vaccines  Completed

## 2015-08-31 NOTE — Assessment & Plan Note (Signed)
S: Last visit we decided to trial nortriptyline after many failed attempts of other pain modulators. She states with this has finally beenable to sleep but most help she has received is from chiropractor. Her pain is at least 50% less A/P: continue current medication- has 4 more sessions with chiropractor as well- she will return if not continuing to improve

## 2015-08-31 NOTE — Assessment & Plan Note (Signed)
S: followed by vascular Dr. Oneida Alar, compiant with astatin, asa, warfarin A/P: continue regular vascular follow up and meds- no worsening symptoms

## 2015-08-31 NOTE — Assessment & Plan Note (Signed)
S: reasonably controlled on simvastatin 40mg . No myalgias.  Lab Results  Component Value Date   CHOL 166 12/31/2012   HDL 65.30 12/31/2012   LDLCALC 75 04/17/2012   LDLDIRECT 70.0 06/05/2014   TRIG 240.0* 12/31/2012   CHOLHDL 3 12/31/2012   A/P: continue current prescription, update lipids and labs

## 2015-08-31 NOTE — Assessment & Plan Note (Signed)
S: controlled. On valsartan-hctz 320-25mg  BP Readings from Last 3 Encounters:  08/31/15 130/74  08/11/15 128/68  07/02/15 126/70  A/P:Continue current meds:  Doing well

## 2015-08-31 NOTE — Assessment & Plan Note (Addendum)
S: 60 pack years. Has trialed inhalers in past with minimal benefit. Follows with allergy/pulm A/P:we will continue without inhalers at this point. Has some SOB with activity but not worsening and able to complete all ADLs and IADLs

## 2015-08-31 NOTE — Assessment & Plan Note (Signed)
S: patient has tried to reduce sugars through cakes/cookies. Done much better job.  Lab Results  Component Value Date   HGBA1C 6.0 06/05/2014  A/P: update a1c today

## 2015-08-31 NOTE — Progress Notes (Signed)
Phone: [phone]  Subjective:  Patient presents today for their annual wellness visit.    Preventive Screening-Counseling & Management  Smoking Status: former Smoker quit in 1995 but 60 pack years Second Hand Smoking status: No smokers in home  Risk Factors Regular exercise: leg exercises she was given in therapy Diet: weight largely stable, at risk for diabetes so will monitor . Limiting sweets.  Fall Risk: None   Cardiac risk factors:  advanced age (older than 61 for men, 22 for women)  Hyperlipidemia - well controlled No diabetes. - at risk, trying to prevent Hypertension- controlled on meds  Depression Screen None. PHQ2 0   Activities of Daily Living Independent ADLs and IADLs   Hearing Difficulties: -patient declines, test 8-10 months ago borderline not sure if would help per audiology to have hearing aid  Cognitive Testing No reported trouble.   Normal 3 word recall  List the Names of Other Physician/Practitioners you currently use: -Dr. Adelene Amas -Dr. Annamaria Boots pulmonary/allergy - Dr. [name] orthopedic  Immunization History  Administered Date(s) Administered  . Influenza Split 12/25/2011  . Influenza Whole 01/23/2010  . Influenza,inj,Quad PF,36+ Mos 12/31/2012, 12/26/2014  . Influenza-Unspecified 01/23/2014  . PPD Test 06/24/2011  . Pneumococcal Conjugate-13 08/31/2015  . Pneumococcal Polysaccharide-23 04/25/2005  . Td 04/25/2005  . Zoster 03/02/2010   Required Immunizations needed today : Tdap declines  Screening tests- up to date Health Maintenance Due  Topic Date Due  . TETANUS/TDAP - declines today 04/26/2015    ROS- No pertinent positives discovered in course of AWV ROS- No chest pain . No headache or blurry vision. Has some shortness of breath- declines inhaler  The following were reviewed and entered/updated in epic: Past Medical History  Diagnosis Date  . GERD (gastroesophageal reflux disease)   . Hyperlipidemia   . History of  colonic polyps   . Diverticulosis   . Shingles   . Eczema   . Allergic rhinoconjunctivitis   . Lung nodule 2011  . Environmental allergies     allergy shot- q friday in Dr. Janee Morn office. PFT's abnormal- recommended Spiriva to use preop & will d/c after surgery  . Arthritis     low back , stenosis  . Anxiety     pt. managed- uses deep breathing   . Stenosis of popliteal artery (HCC)     blood clots in legs long ago    . COPD (chronic obstructive pulmonary disease) ([place])   . H/O hiatal hernia   . Hypertension   . Trigeminal neuralgia   . Peripheral vascular disease ([place])   . DVT (deep venous thrombosis) ([place])   . COLONIC POLYPS, RECURRENT 08/29/2006    2008 last colonoscopy. No further colonoscopy.      Patient Active Problem List   Diagnosis Date Noted  . Pain in joint, lower leg 08/28/2014    Priority: High  . Peripheral arterial disease ([place]) 06/29/2007    Priority: High  . Trigeminal neuralgia     Priority: Medium  . COPD mixed type ([place]) 01/22/2010    Priority: Medium  . Hyperglycemia 01/06/2009    Priority: Medium  . Hyperlipidemia 10/23/2006    Priority: Medium  . Essential hypertension 10/23/2006    Priority: Medium  . ESOPHAGEAL STRICTURE 08/09/1993    Priority: Medium  . Encounter for therapeutic drug monitoring 10/21/2013    Priority: Low  . Syncope 12/29/2011    Priority: Low  . Allergic rhinitis due to pollen 06/25/2010    Priority: Low  . Solitary pulmonary  nodule 01/12/2010    Priority: Low  . Osteopenia 01/06/2009    Priority: Low  . ECZEMA 11/26/2007    Priority: Low   Past Surgical History  Procedure Laterality Date  . Tubal ligation    . Cholecystectomy  1998  . Femoral-popliteal bypass graft         x2 surgeries 1990's & 2009  . Eye surgery      cataracts removed- bilateral /w IOL  . Tonsillectomy      as a teenager   . Lumbar fusion  07/06/2011  . Abdominal aortagram N/A 06/17/2011    Procedure: ABDOMINAL Maxcine Ham;  Surgeon: Elam Dutch, MD;  Location: [facility] CATH LAB;  Service: Cardiovascular;  Laterality: N/A;  . Injection knee Right Aug. 2016    Gel injection for pain    Family History  Problem Relation Age of Onset  . Arthritis Mother   . Hypertension Mother   . Heart disease Father   . Colon polyps Father   . Breast cancer Sister   . Lung cancer Sister   . Colon cancer Sister   . Cancer Sister     Breast  . Lung cancer Brother   . Cancer Brother     Lung  . Anesthesia problems Neg Hx   . Hypotension Neg Hx   . Malignant hyperthermia Neg Hx   . Pseudochol deficiency Neg Hx   . Cancer Daughter     Breast  . Hypertension Son     Medications- reviewed and updated Current Outpatient Prescriptions  Medication Sig Dispense Refill  . aspirin EC 81 MG tablet Take 81 mg by mouth daily.    . calcium-vitamin D (OSCAL WITH D 500-200) 500-200 MG-UNIT per tablet Take 1 tablet by mouth daily.      . Cholecalciferol (VITAMIN D3) 1000 UNITS CAPS Take 1,000 Units by mouth daily.     Marland Kitchen glucosamine-chondroitin 500-400 MG tablet Take 1 tablet by mouth 2 (two) times daily.     . NONFORMULARY OR COMPOUNDED ITEM Inject 1 mL into the skin once a week. Allergy Vaccine 1:10 Given at [facility] on Friday    . nortriptyline (PAMELOR) 10 MG capsule Take 1 capsule (10 mg total) by mouth at bedtime. 30 capsule 3  . omeprazole (PRILOSEC) 20 MG capsule Take 20 mg by mouth every morning.     . simvastatin (ZOCOR) 40 MG tablet TAKE 1 TABLET (40 MG TOTAL) BY MOUTH DAILY AT 6 PM. 90 tablet 3  . valsartan-hydrochlorothiazide (DIOVAN-HCT) 320-25 MG tablet Take 1 tablet by mouth daily. 90 tablet 1  . warfarin (COUMADIN) 5 MG tablet TAKE 2 & 1/2 TABLETS ON THURSDAY THEN TAKE 2 TABLETS THE REST OF THE DAYS 180 tablet 1  . acetaminophen (TYLENOL) 500 MG tablet Take 500-1,500 mg by mouth 3 (three) times daily as needed for moderate pain. Reported on 08/31/2015     No current facility-administered medications for this visit.     Allergies-reviewed and updated Allergies  Allergen Reactions  . Sulfa Antibiotics Other (See Comments)    Cold sweat light headed and disorientation  . Tiotropium Bromide Shortness Of Breath and Other (See Comments)    Sore throat also  . Tramadol Nausea And Vomiting  . Latex Itching and Rash    Social History   Social History  . Marital Status: Single    Spouse Name: N/A  . Number of Children: 4  . Years of Education: 12   Occupational History  .  Social History Main Topics  . Smoking status: Former Smoker -- 2.00 packs/day for 30 years    Types: Cigarettes    Quit date: 06/18/1993  . Smokeless tobacco: Never Used     Comment: QUIT IN 1995  . Alcohol Use: No  . Drug Use: No  . Sexual Activity: Not Asked   Other Topics Concern  . None   Social History Narrative   Patient is widowed with 4 children. 2 grandkids. Lives alone.    Patient is right handed.   Patient has high school education.   Patient drinks 1 cup daily.      Retired from [employer] for 28 years, works 2 days a week until 2016.       Hobbies: volunteers at [employer], [place] from church visits people.           Objective: BP 130/74 mmHg  Pulse 71  Temp(Src) 97.5 F (36.4 C)  Ht 5' (1.524 m)  Wt 181 lb (82.101 kg)  BMI 35.35 kg/m2 Gen: NAD, resting comfortably HEENT: Mucous membranes are moist. Oropharynx normal CV: RRR no murmurs rubs or gallops Lungs: CTAB no crackles, wheeze, rhonchi Abdomen: soft/nontender/nondistended/normal bowel sounds. No rebound or guarding.  Ext: no edema, 1+ DP pulses Skin: warm, dry Neuro: grossly normal, moves all extremities, PERRLA  Assessment/Plan:  AWV completed- discussed recommended screenings anddocumented any personalized health advice and referrals for preventive counseling. See AVS as well which was given to patient.   Problem oriented  Burning with urination - Plan: POCT Urinalysis Dipstick (Automated) S: about 2  weeks. Minimal polyuria. 1-2x a night stable. No vaginal discharge. No blood. No medications trialed. No fever or CVA tenderness Results for orders placed or performed in visit on 08/31/15 (from the past 24 hour(s))  POCT Urinalysis Dipstick (Automated)     Status: Abnormal   Collection Time: 08/31/15  8:14 AM  Result Value Ref Range   Color, UA yellow    Clarity, UA clear    Glucose, UA n    Bilirubin, UA 1+    Ketones, UA 1+    Spec Grav, UA 1.025    Blood, UA n    pH, UA 5.0    Protein, UA 2+    Urobilinogen, UA 1.0    Nitrite, UA n    Leukocytes, UA small (1+) (A) Negative  A/P:  Check urine culture   Pain in joint, lower leg S: Last visit we decided to trial nortriptyline after many failed attempts of other pain modulators. She states with this has finally beenable to sleep but most help she has received is from chiropractor. Her pain is at least 50% less A/P: continue current medication- has 4 more sessions with chiropractor as well- she will return if not continuing to improve   COPD mixed type S: 60 pack years. Has trialed inhalers in past with minimal benefit. Follows with allergy/pulm A/P:we will continue without inhalers at this point. Has some SOB with activity but not worsening and able to complete all ADLs and IADLs  Hyperlipidemia S: reasonably controlled on simvastatin 40mg . No myalgias.  Lab Results  Component Value Date   CHOL 166 12/31/2012   HDL 65.30 12/31/2012   LDLCALC 75 04/17/2012   LDLDIRECT 70.0 06/05/2014   TRIG 240.0* 12/31/2012   CHOLHDL 3 12/31/2012   A/P: continue current prescription, update lipids and labs  Peripheral arterial disease S: followed by vascular Dr. Oneida Alar, compiant with astatin, asa, warfarin A/P: continue regular vascular follow  up and meds- no worsening symptoms  Hyperglycemia S: patient has tried to reduce sugars through cakes/cookies. Done much better job.  Lab Results  Component Value Date   HGBA1C 6.0 06/05/2014   A/P: update a1c today  Essential hypertension S: controlled. On valsartan-hctz 320-25mg  BP Readings from Last 3 Encounters:  08/31/15 130/74  08/11/15 128/68  07/02/15 126/70  A/P:Continue current meds:  Doing well   Return in about 6 months (around 03/02/2016). Return precautions advised.   Orders Placed This Encounter  Procedures  . Culture, Urine  . Pneumococcal conjugate vaccine 13-valent  . CBC    [facility]  . Comprehensive metabolic panel    [place]    Order Specific Question:  Has the patient fasted?    Answer:  No  . Lipid panel    [place]    Order Specific Question:  Has the patient fasted?    Answer:  No  . Hemoglobin A1c    [place]  . POCT Urinalysis Dipstick (Automated)   [name], MD

## 2015-09-02 DIAGNOSIS — M4716 Other spondylosis with myelopathy, lumbar region: Secondary | ICD-10-CM | POA: Diagnosis not present

## 2015-09-02 DIAGNOSIS — M5431 Sciatica, right side: Secondary | ICD-10-CM | POA: Diagnosis not present

## 2015-09-02 DIAGNOSIS — M9902 Segmental and somatic dysfunction of thoracic region: Secondary | ICD-10-CM | POA: Diagnosis not present

## 2015-09-02 DIAGNOSIS — M9903 Segmental and somatic dysfunction of lumbar region: Secondary | ICD-10-CM | POA: Diagnosis not present

## 2015-09-02 DIAGNOSIS — M4604 Spinal enthesopathy, thoracic region: Secondary | ICD-10-CM | POA: Diagnosis not present

## 2015-09-02 DIAGNOSIS — M9905 Segmental and somatic dysfunction of pelvic region: Secondary | ICD-10-CM | POA: Diagnosis not present

## 2015-09-02 LAB — URINE CULTURE: Colony Count: 40000

## 2015-09-03 ENCOUNTER — Telehealth: Payer: Self-pay

## 2015-09-03 NOTE — Telephone Encounter (Signed)
Left message for patient to call office regarding her urine results and recommendations.

## 2015-09-03 NOTE — Telephone Encounter (Signed)
-----   Message from Marin Olp, MD sent at 09/02/2015 12:22 PM EDT ----- Please call Patient's urine does not meet criteria for UTI either with specific bacteria or amount as <100k colonies If she would like to drop off another urine sample if symptoms are persistent or worsen she may Could also try some cranberry tablets OTC as well as increasing hydration

## 2015-09-07 ENCOUNTER — Telehealth: Payer: Self-pay

## 2015-09-07 NOTE — Telephone Encounter (Signed)
-----   Message from Marin Olp, MD sent at 09/05/2015  9:08 AM EDT ----- Patient had visit on 08/31/15 and was supposed to get blood drawn but did not- she needs to return for all the bloodwork that was ordered that day.   ----- Message -----    From: SYSTEM    Sent: 09/05/2015  12:05 AM      To: Marin Olp, MD

## 2015-09-07 NOTE — Telephone Encounter (Signed)
Lab appt has been scheduled with pt for 05/18 @ 10:00am.

## 2015-09-09 DIAGNOSIS — M4604 Spinal enthesopathy, thoracic region: Secondary | ICD-10-CM | POA: Diagnosis not present

## 2015-09-09 DIAGNOSIS — M4716 Other spondylosis with myelopathy, lumbar region: Secondary | ICD-10-CM | POA: Diagnosis not present

## 2015-09-09 DIAGNOSIS — M9902 Segmental and somatic dysfunction of thoracic region: Secondary | ICD-10-CM | POA: Diagnosis not present

## 2015-09-09 DIAGNOSIS — M9905 Segmental and somatic dysfunction of pelvic region: Secondary | ICD-10-CM | POA: Diagnosis not present

## 2015-09-09 DIAGNOSIS — M5431 Sciatica, right side: Secondary | ICD-10-CM | POA: Diagnosis not present

## 2015-09-09 DIAGNOSIS — M9903 Segmental and somatic dysfunction of lumbar region: Secondary | ICD-10-CM | POA: Diagnosis not present

## 2015-09-10 ENCOUNTER — Encounter: Payer: Self-pay | Admitting: Internal Medicine

## 2015-09-10 ENCOUNTER — Ambulatory Visit (INDEPENDENT_AMBULATORY_CARE_PROVIDER_SITE_OTHER): Payer: Medicare Other | Admitting: General Practice

## 2015-09-10 ENCOUNTER — Other Ambulatory Visit (INDEPENDENT_AMBULATORY_CARE_PROVIDER_SITE_OTHER): Payer: Medicare Other

## 2015-09-10 DIAGNOSIS — E785 Hyperlipidemia, unspecified: Secondary | ICD-10-CM | POA: Diagnosis not present

## 2015-09-10 DIAGNOSIS — I749 Embolism and thrombosis of unspecified artery: Secondary | ICD-10-CM

## 2015-09-10 DIAGNOSIS — Z5181 Encounter for therapeutic drug level monitoring: Secondary | ICD-10-CM

## 2015-09-10 DIAGNOSIS — R739 Hyperglycemia, unspecified: Secondary | ICD-10-CM | POA: Diagnosis not present

## 2015-09-10 DIAGNOSIS — Z0000 Encounter for general adult medical examination without abnormal findings: Secondary | ICD-10-CM

## 2015-09-10 LAB — CBC WITH DIFFERENTIAL/PLATELET
Basophils Absolute: 0 10*3/uL (ref 0.0–0.1)
Basophils Relative: 0.4 % (ref 0.0–3.0)
EOS PCT: 2.3 % (ref 0.0–5.0)
Eosinophils Absolute: 0.2 10*3/uL (ref 0.0–0.7)
HEMATOCRIT: 38.8 % (ref 36.0–46.0)
Hemoglobin: 13 g/dL (ref 12.0–15.0)
LYMPHS ABS: 2.3 10*3/uL (ref 0.7–4.0)
LYMPHS PCT: 26.9 % (ref 12.0–46.0)
MCHC: 33.5 g/dL (ref 30.0–36.0)
MCV: 84.3 fl (ref 78.0–100.0)
MONOS PCT: 5.8 % (ref 3.0–12.0)
Monocytes Absolute: 0.5 10*3/uL (ref 0.1–1.0)
NEUTROS ABS: 5.4 10*3/uL (ref 1.4–7.7)
NEUTROS PCT: 64.6 % (ref 43.0–77.0)
Platelets: 199 10*3/uL (ref 150.0–400.0)
RBC: 4.61 Mil/uL (ref 3.87–5.11)
RDW: 13.8 % (ref 11.5–15.5)
WBC: 8.4 10*3/uL (ref 4.0–10.5)

## 2015-09-10 LAB — COMPREHENSIVE METABOLIC PANEL
ALT: 11 U/L (ref 0–35)
AST: 14 U/L (ref 0–37)
Albumin: 4.4 g/dL (ref 3.5–5.2)
Alkaline Phosphatase: 64 U/L (ref 39–117)
BUN: 19 mg/dL (ref 6–23)
CALCIUM: 9.7 mg/dL (ref 8.4–10.5)
CO2: 28 meq/L (ref 19–32)
Chloride: 105 mEq/L (ref 96–112)
Creatinine, Ser: 0.94 mg/dL (ref 0.40–1.20)
GFR: 59.62 mL/min — AB (ref >60.00)
GLUCOSE: 129 mg/dL — AB (ref 70–99)
POTASSIUM: 4.3 meq/L (ref 3.5–5.1)
Sodium: 138 mEq/L (ref 135–145)
Total Bilirubin: 0.4 mg/dL (ref 0.2–1.2)
Total Protein: 6.8 g/dL (ref 6.0–8.3)

## 2015-09-10 LAB — LIPID PANEL
CHOL/HDL RATIO: 3
Cholesterol: 134 mg/dL (ref 0–200)
HDL: 48.6 mg/dL (ref >39.00)
LDL Cholesterol: 56 mg/dL (ref 0–99)
NONHDL: 84.91
Triglycerides: 143 mg/dL (ref 0.0–149.0)
VLDL: 28.6 mg/dL (ref 0.0–40.0)

## 2015-09-10 LAB — POCT INR: INR: 2.1

## 2015-09-10 LAB — HEMOGLOBIN A1C: HEMOGLOBIN A1C: 6.3 % (ref 4.6–6.5)

## 2015-09-10 NOTE — Progress Notes (Signed)
Pre visit review using our clinic review tool, if applicable. No additional management support is needed unless otherwise documented below in the visit note. 

## 2015-09-11 ENCOUNTER — Ambulatory Visit (INDEPENDENT_AMBULATORY_CARE_PROVIDER_SITE_OTHER): Payer: Medicare Other

## 2015-09-11 ENCOUNTER — Ambulatory Visit: Payer: Medicare Other

## 2015-09-11 DIAGNOSIS — M9905 Segmental and somatic dysfunction of pelvic region: Secondary | ICD-10-CM | POA: Diagnosis not present

## 2015-09-11 DIAGNOSIS — M9902 Segmental and somatic dysfunction of thoracic region: Secondary | ICD-10-CM | POA: Diagnosis not present

## 2015-09-11 DIAGNOSIS — M5431 Sciatica, right side: Secondary | ICD-10-CM | POA: Diagnosis not present

## 2015-09-11 DIAGNOSIS — J309 Allergic rhinitis, unspecified: Secondary | ICD-10-CM | POA: Diagnosis not present

## 2015-09-11 DIAGNOSIS — M4604 Spinal enthesopathy, thoracic region: Secondary | ICD-10-CM | POA: Diagnosis not present

## 2015-09-11 DIAGNOSIS — M4716 Other spondylosis with myelopathy, lumbar region: Secondary | ICD-10-CM | POA: Diagnosis not present

## 2015-09-11 DIAGNOSIS — M9903 Segmental and somatic dysfunction of lumbar region: Secondary | ICD-10-CM | POA: Diagnosis not present

## 2015-09-14 ENCOUNTER — Ambulatory Visit (INDEPENDENT_AMBULATORY_CARE_PROVIDER_SITE_OTHER): Payer: Medicare Other | Admitting: Family Medicine

## 2015-09-14 ENCOUNTER — Encounter: Payer: Self-pay | Admitting: Family Medicine

## 2015-09-14 ENCOUNTER — Ambulatory Visit: Payer: Medicare Other

## 2015-09-14 VITALS — BP 142/82 | HR 68 | Temp 98.1°F | Ht 60.0 in | Wt 178.0 lb

## 2015-09-14 DIAGNOSIS — I779 Disorder of arteries and arterioles, unspecified: Secondary | ICD-10-CM

## 2015-09-14 DIAGNOSIS — R739 Hyperglycemia, unspecified: Secondary | ICD-10-CM

## 2015-09-14 MED ORDER — METFORMIN HCL 500 MG PO TABS: 500.0000 mg | ORAL_TABLET | Freq: Every day | ORAL | Status: DC

## 2015-09-14 NOTE — Progress Notes (Signed)
Subjective:  [name] [name] is a 82 y.o. year old very pleasant female patient who presents for/with See problem oriented charting ROS- no hypoglycemia, no headache or blurry vision. No chest pain or shortness of breath.see any ROS included in HPI as well.   Past Medical History-  Patient Active Problem List   Diagnosis Date Noted  . Pain in joint, lower leg 08/28/2014    Priority: High  . Peripheral arterial disease ([place]) 06/29/2007    Priority: High  . Trigeminal neuralgia     Priority: Medium  . COPD mixed type ([place]) 01/22/2010    Priority: Medium  . Hyperglycemia 01/06/2009    Priority: Medium  . Hyperlipidemia 10/23/2006    Priority: Medium  . Essential hypertension 10/23/2006    Priority: Medium  . ESOPHAGEAL STRICTURE 08/09/1993    Priority: Medium  . Encounter for therapeutic drug monitoring 10/21/2013    Priority: Low  . Syncope 12/29/2011    Priority: Low  . Allergic rhinitis due to pollen 06/25/2010    Priority: Low  . Solitary pulmonary nodule 01/12/2010    Priority: Low  . Osteopenia 01/06/2009    Priority: Low  . ECZEMA 11/26/2007    Priority: Low    Medications- reviewed and updated Current Outpatient Prescriptions  Medication Sig Dispense Refill  . acetaminophen (TYLENOL) 500 MG tablet Take 500-1,500 mg by mouth 3 (three) times daily as needed for moderate pain. Reported on 08/31/2015    . aspirin EC 81 MG tablet Take 81 mg by mouth daily.    . calcium-vitamin D (OSCAL WITH D 500-200) 500-200 MG-UNIT per tablet Take 1 tablet by mouth daily.      . Cholecalciferol (VITAMIN D3) 1000 UNITS CAPS Take 1,000 Units by mouth daily.     Marland Kitchen glucosamine-chondroitin 500-400 MG tablet Take 1 tablet by mouth 2 (two) times daily.     . NONFORMULARY OR COMPOUNDED ITEM Inject 1 mL into the skin once a week. Allergy Vaccine 1:10 Given at [facility] on Friday    . nortriptyline (PAMELOR) 10 MG capsule Take 1 capsule (10 mg total) by mouth at bedtime. 30 capsule 3   . omeprazole (PRILOSEC) 20 MG capsule Take 20 mg by mouth every morning.     . simvastatin (ZOCOR) 40 MG tablet TAKE 1 TABLET (40 MG TOTAL) BY MOUTH DAILY AT 6 PM. 90 tablet 3  . valsartan-hydrochlorothiazide (DIOVAN-HCT) 320-25 MG tablet Take 1 tablet by mouth daily. 90 tablet 1  . warfarin (COUMADIN) 5 MG tablet TAKE 2 & 1/2 TABLETS ON THURSDAY THEN TAKE 2 TABLETS THE REST OF THE DAYS 180 tablet 1  . metFORMIN (GLUCOPHAGE) 500 MG tablet Take 1 tablet (500 mg total) by mouth daily with breakfast. 90 tablet 3   No current facility-administered medications for this visit.    Objective: BP 142/82 mmHg  Pulse 68  Temp(Src) 98.1 F (36.7 C) (Oral)  Ht 5' (1.524 m)  Wt 178 lb (80.74 kg)  BMI 34.76 kg/m2  SpO2 97% Gen: NAD, resting comfortably in chair  Assessment/Plan:  Hyperglycemia S: Patient came in to discuss high increased risk of diabetes with a1c 6.3 and fasting sugar of 123. She admits to Sweet addiction and states she could live on sweets if she could. Better over 2-3 months and even better since got lab results. She has lost 3 lbs in 2 weeks with better eating. Exercise limited by orthopedic issues A/P: we discussed healthy lifestyle choices including low carb diet. We opted for metformin 500mg   each AM if she can tolerate it to help with prevention then follow up in 6 months and repeat a1c at that time. Extensive counseling.    Return in about 4 months (around 01/15/2016) for follow up- or sooner if needed. Return precautions advised.   Meds ordered this encounter  Medications  . metFORMIN (GLUCOPHAGE) 500 MG tablet    Sig: Take 1 tablet (500 mg total) by mouth daily with breakfast.    Dispense:  90 tablet    Refill:  3   The duration of face-to-face time during this visit was 15 minutes. Greater than 50% of this time was spent in counseling, explanation of diagnosis, planning of further management, and/or coordination of care.    [name], MD

## 2015-09-14 NOTE — Patient Instructions (Signed)
Take 1/2 a pill of metformin each morning for 2 weeks then increase to full pill. This along with cutting back on your sugars will hopefully keep you from developing diabetes. You are at very high increased risk so that is why we are going to be aggressive.

## 2015-09-14 NOTE — Assessment & Plan Note (Signed)
S: Patient came in to discuss high increased risk of diabetes with a1c 6.3 and fasting sugar of 123. She admits to Sweet addiction and states she could live on sweets if she could. Better over 2-3 months and even better since got lab results. She has lost 3 lbs in 2 weeks with better eating. Exercise limited by orthopedic issues A/P: we discussed healthy lifestyle choices including low carb diet. We opted for metformin 500mg  each AM if she can tolerate it to help with prevention then follow up in 6 months and repeat a1c at that time. Extensive counseling.

## 2015-09-16 DIAGNOSIS — M5431 Sciatica, right side: Secondary | ICD-10-CM | POA: Diagnosis not present

## 2015-09-16 DIAGNOSIS — M4716 Other spondylosis with myelopathy, lumbar region: Secondary | ICD-10-CM | POA: Diagnosis not present

## 2015-09-16 DIAGNOSIS — M9902 Segmental and somatic dysfunction of thoracic region: Secondary | ICD-10-CM | POA: Diagnosis not present

## 2015-09-16 DIAGNOSIS — M4604 Spinal enthesopathy, thoracic region: Secondary | ICD-10-CM | POA: Diagnosis not present

## 2015-09-16 DIAGNOSIS — M9903 Segmental and somatic dysfunction of lumbar region: Secondary | ICD-10-CM | POA: Diagnosis not present

## 2015-09-16 DIAGNOSIS — M9905 Segmental and somatic dysfunction of pelvic region: Secondary | ICD-10-CM | POA: Diagnosis not present

## 2015-09-17 DIAGNOSIS — M94261 Chondromalacia, right knee: Secondary | ICD-10-CM | POA: Diagnosis not present

## 2015-09-18 ENCOUNTER — Ambulatory Visit (INDEPENDENT_AMBULATORY_CARE_PROVIDER_SITE_OTHER): Payer: Medicare Other

## 2015-09-18 DIAGNOSIS — J309 Allergic rhinitis, unspecified: Secondary | ICD-10-CM | POA: Diagnosis not present

## 2015-09-23 DIAGNOSIS — M9905 Segmental and somatic dysfunction of pelvic region: Secondary | ICD-10-CM | POA: Diagnosis not present

## 2015-09-23 DIAGNOSIS — M9903 Segmental and somatic dysfunction of lumbar region: Secondary | ICD-10-CM | POA: Diagnosis not present

## 2015-09-23 DIAGNOSIS — M9902 Segmental and somatic dysfunction of thoracic region: Secondary | ICD-10-CM | POA: Diagnosis not present

## 2015-09-23 DIAGNOSIS — M4716 Other spondylosis with myelopathy, lumbar region: Secondary | ICD-10-CM | POA: Diagnosis not present

## 2015-09-23 DIAGNOSIS — M5431 Sciatica, right side: Secondary | ICD-10-CM | POA: Diagnosis not present

## 2015-09-23 DIAGNOSIS — M4604 Spinal enthesopathy, thoracic region: Secondary | ICD-10-CM | POA: Diagnosis not present

## 2015-09-25 ENCOUNTER — Ambulatory Visit (INDEPENDENT_AMBULATORY_CARE_PROVIDER_SITE_OTHER): Payer: Medicare Other

## 2015-09-25 DIAGNOSIS — J309 Allergic rhinitis, unspecified: Secondary | ICD-10-CM | POA: Diagnosis not present

## 2015-09-30 DIAGNOSIS — M4604 Spinal enthesopathy, thoracic region: Secondary | ICD-10-CM | POA: Diagnosis not present

## 2015-09-30 DIAGNOSIS — M4716 Other spondylosis with myelopathy, lumbar region: Secondary | ICD-10-CM | POA: Diagnosis not present

## 2015-09-30 DIAGNOSIS — M5431 Sciatica, right side: Secondary | ICD-10-CM | POA: Diagnosis not present

## 2015-09-30 DIAGNOSIS — M9902 Segmental and somatic dysfunction of thoracic region: Secondary | ICD-10-CM | POA: Diagnosis not present

## 2015-09-30 DIAGNOSIS — M9905 Segmental and somatic dysfunction of pelvic region: Secondary | ICD-10-CM | POA: Diagnosis not present

## 2015-09-30 DIAGNOSIS — M9903 Segmental and somatic dysfunction of lumbar region: Secondary | ICD-10-CM | POA: Diagnosis not present

## 2015-10-02 ENCOUNTER — Ambulatory Visit (INDEPENDENT_AMBULATORY_CARE_PROVIDER_SITE_OTHER): Payer: Medicare Other | Admitting: *Deleted

## 2015-10-02 DIAGNOSIS — J309 Allergic rhinitis, unspecified: Secondary | ICD-10-CM | POA: Diagnosis not present

## 2015-10-09 ENCOUNTER — Ambulatory Visit (INDEPENDENT_AMBULATORY_CARE_PROVIDER_SITE_OTHER): Payer: Medicare Other | Admitting: *Deleted

## 2015-10-09 ENCOUNTER — Ambulatory Visit (INDEPENDENT_AMBULATORY_CARE_PROVIDER_SITE_OTHER): Payer: Medicare Other | Admitting: General Practice

## 2015-10-09 DIAGNOSIS — J309 Allergic rhinitis, unspecified: Secondary | ICD-10-CM | POA: Diagnosis not present

## 2015-10-09 DIAGNOSIS — I749 Embolism and thrombosis of unspecified artery: Secondary | ICD-10-CM | POA: Diagnosis not present

## 2015-10-09 DIAGNOSIS — Z5181 Encounter for therapeutic drug level monitoring: Secondary | ICD-10-CM | POA: Diagnosis not present

## 2015-10-09 LAB — POCT INR: INR: 2.1

## 2015-10-09 NOTE — Progress Notes (Signed)
Pre visit review using our clinic review tool, if applicable. No additional management support is needed unless otherwise documented below in the visit note. 

## 2015-10-11 NOTE — Progress Notes (Signed)
I have reviewed and agree with the plan. 

## 2015-10-16 ENCOUNTER — Ambulatory Visit (INDEPENDENT_AMBULATORY_CARE_PROVIDER_SITE_OTHER): Payer: Medicare Other

## 2015-10-16 DIAGNOSIS — J309 Allergic rhinitis, unspecified: Secondary | ICD-10-CM

## 2015-10-23 ENCOUNTER — Ambulatory Visit (INDEPENDENT_AMBULATORY_CARE_PROVIDER_SITE_OTHER): Payer: Medicare Other

## 2015-10-23 DIAGNOSIS — J309 Allergic rhinitis, unspecified: Secondary | ICD-10-CM | POA: Diagnosis not present

## 2015-10-30 ENCOUNTER — Ambulatory Visit: Payer: Medicare Other

## 2015-11-06 ENCOUNTER — Ambulatory Visit (INDEPENDENT_AMBULATORY_CARE_PROVIDER_SITE_OTHER): Payer: Medicare Other | Admitting: General Practice

## 2015-11-06 ENCOUNTER — Ambulatory Visit (INDEPENDENT_AMBULATORY_CARE_PROVIDER_SITE_OTHER): Payer: Medicare Other

## 2015-11-06 DIAGNOSIS — Z5181 Encounter for therapeutic drug level monitoring: Secondary | ICD-10-CM

## 2015-11-06 DIAGNOSIS — J309 Allergic rhinitis, unspecified: Secondary | ICD-10-CM | POA: Diagnosis not present

## 2015-11-06 LAB — POCT INR: INR: 2.4

## 2015-11-06 NOTE — Progress Notes (Signed)
Pre visit review using our clinic review tool, if applicable. No additional management support is needed unless otherwise documented below in the visit note. 

## 2015-11-06 NOTE — Progress Notes (Signed)
I have reviewed and agree with the plan. 

## 2015-11-13 ENCOUNTER — Ambulatory Visit (INDEPENDENT_AMBULATORY_CARE_PROVIDER_SITE_OTHER): Payer: Medicare Other

## 2015-11-13 DIAGNOSIS — J309 Allergic rhinitis, unspecified: Secondary | ICD-10-CM

## 2015-11-17 ENCOUNTER — Telehealth: Payer: Self-pay | Admitting: Internal Medicine

## 2015-11-17 DIAGNOSIS — J309 Allergic rhinitis, unspecified: Secondary | ICD-10-CM | POA: Diagnosis not present

## 2015-11-17 NOTE — Telephone Encounter (Signed)
Allergy Serum Extract Date Mixed: 11/17/15 Vial: 2 Strength: 1:10 Here/Mail/Pick Up: here Mixed By: tbs Last OV: 12/18/14 Pending OV: 12/21/15

## 2015-11-20 ENCOUNTER — Ambulatory Visit (INDEPENDENT_AMBULATORY_CARE_PROVIDER_SITE_OTHER): Payer: Medicare Other

## 2015-11-20 DIAGNOSIS — J309 Allergic rhinitis, unspecified: Secondary | ICD-10-CM

## 2015-11-27 ENCOUNTER — Ambulatory Visit (INDEPENDENT_AMBULATORY_CARE_PROVIDER_SITE_OTHER): Payer: Medicare Other | Admitting: General Practice

## 2015-11-27 ENCOUNTER — Ambulatory Visit (INDEPENDENT_AMBULATORY_CARE_PROVIDER_SITE_OTHER): Payer: Medicare Other | Admitting: *Deleted

## 2015-11-27 DIAGNOSIS — I749 Embolism and thrombosis of unspecified artery: Secondary | ICD-10-CM

## 2015-11-27 DIAGNOSIS — Z5181 Encounter for therapeutic drug level monitoring: Secondary | ICD-10-CM

## 2015-11-27 DIAGNOSIS — J309 Allergic rhinitis, unspecified: Secondary | ICD-10-CM | POA: Diagnosis not present

## 2015-11-27 LAB — POCT INR: INR: 2.3

## 2015-11-30 ENCOUNTER — Telehealth: Payer: Self-pay | Admitting: Family Medicine

## 2015-11-30 NOTE — Telephone Encounter (Signed)
° °  Pt call to say that she is no longer taking the following med said she was having the following symptons. She said she started having blurred vision and very shaky.  She ask what does Dr Yong Channel want her to do .    metFORMIN (GLUCOPHAGE) 500 MG tablet

## 2015-12-01 NOTE — Telephone Encounter (Signed)
Did symptoms resolve off medicine? If so- then remain off and repeat a1c at follow up. May need lower dose such as 250mg  depending on findings at that time. If she is still having symptoms- needs evaluation

## 2015-12-02 NOTE — Telephone Encounter (Signed)
York Cerise this encounter was closed. Pt is calling back. Please advise

## 2015-12-03 ENCOUNTER — Other Ambulatory Visit: Payer: Self-pay

## 2015-12-03 DIAGNOSIS — R739 Hyperglycemia, unspecified: Secondary | ICD-10-CM

## 2015-12-03 MED ORDER — GLUCOSE BLOOD VI STRP: ORAL_STRIP | 12 refills | Status: DC

## 2015-12-03 MED ORDER — GLUCOSE BLOOD VI STRP
ORAL_STRIP | 12 refills | Status: DC
Start: 2015-12-03 — End: 2016-07-21

## 2015-12-03 MED ORDER — ONETOUCH ULTRASOFT LANCETS MISC
12 refills | Status: DC
Start: 2015-12-03 — End: 2016-07-21

## 2015-12-03 MED ORDER — ONETOUCH ULTRASOFT LANCETS MISC: 12 refills | Status: DC

## 2015-12-03 NOTE — Telephone Encounter (Signed)
Spoke to patient and she states her symptoms have resolved. She understands to stop taking medication and that we will recheck her A1C at her next visit. Patient is checking her blood sugars at home. She request more test strips and lancets.

## 2015-12-04 ENCOUNTER — Ambulatory Visit (INDEPENDENT_AMBULATORY_CARE_PROVIDER_SITE_OTHER): Payer: Medicare Other | Admitting: *Deleted

## 2015-12-04 DIAGNOSIS — J309 Allergic rhinitis, unspecified: Secondary | ICD-10-CM

## 2015-12-09 ENCOUNTER — Ambulatory Visit: Payer: Medicare Other | Admitting: Adult Health

## 2015-12-11 ENCOUNTER — Ambulatory Visit (INDEPENDENT_AMBULATORY_CARE_PROVIDER_SITE_OTHER): Payer: Medicare Other | Admitting: *Deleted

## 2015-12-11 DIAGNOSIS — J309 Allergic rhinitis, unspecified: Secondary | ICD-10-CM | POA: Diagnosis not present

## 2015-12-14 ENCOUNTER — Ambulatory Visit (INDEPENDENT_AMBULATORY_CARE_PROVIDER_SITE_OTHER): Payer: Medicare Other | Admitting: Internal Medicine

## 2015-12-14 ENCOUNTER — Ambulatory Visit (INDEPENDENT_AMBULATORY_CARE_PROVIDER_SITE_OTHER)
Admission: RE | Admit: 2015-12-14 | Discharge: 2015-12-14 | Disposition: A | Discharge status: Home / Self Care | Payer: Medicare Other | Source: Ambulatory Visit | Attending: Internal Medicine | Admitting: Internal Medicine

## 2015-12-14 ENCOUNTER — Encounter: Payer: Self-pay | Admitting: Internal Medicine

## 2015-12-14 VITALS — BP 118/68 | HR 71 | Ht 60.0 in | Wt 176.6 lb

## 2015-12-14 DIAGNOSIS — R911 Solitary pulmonary nodule: Secondary | ICD-10-CM

## 2015-12-14 DIAGNOSIS — J301 Allergic rhinitis due to pollen: Secondary | ICD-10-CM

## 2015-12-14 DIAGNOSIS — I779 Disorder of arteries and arterioles, unspecified: Secondary | ICD-10-CM

## 2015-12-14 DIAGNOSIS — J449 Chronic obstructive pulmonary disease, unspecified: Secondary | ICD-10-CM | POA: Diagnosis not present

## 2015-12-14 DIAGNOSIS — R918 Other nonspecific abnormal finding of lung field: Secondary | ICD-10-CM | POA: Diagnosis not present

## 2015-12-14 NOTE — Progress Notes (Signed)
Patient ID: PATRIKA RHOADS, female    DOB: 1924/11/21, 82 y.o.   MRN: [id number]  HPI F former smoker, followed here for allergic rhinitis, watery eyes,  COPD, lung nodules.   04/19/12- 62 yoF former smoker, followed here for allergic rhinitis on allergy vaccine, and COPD, lung nodules. FOLLOWS FOR: Pt c/o increased SOB with exertion. Denies cough, wheezing, chest tightness.  She chokes easily on her food, which she finds "aggravating". Otherwise does not usually cough reports no phlegm, fever or sweat. No heartburn or food hanging up. Maybe a little more shortness of breath on exertion over the last month or 2, climbing stairs. She had a normal stress echocardiogram documented below. She recognized improved stamina when she was going regularly to [employer], but she drifted away from that over the holidays. Notes Recorded by Josue Hector, MD on 01/10/2012: Normal stress echo CT 04/11/12- IMPRESSION:  1. Findings, as above, remain consistent with a chronic indolent  atypical infectious process, likely a mycobacterial infection. The  cluster of peribronchovascular micronodules in the lateral segment  of the right middle lobe that was new on the prior examination is  persistent, however, the largest nodule within this cluster has  resolved. No findings suspicious for malignancy are seen on  today's examination.  2. Atherosclerosis, including left main and three-vessel coronary  artery disease.  3. Dilatation of the pulmonic trunk (3.8 cm in diameter), which  may suggest pulmonary arterial hypertension.  4. Additional incidental findings, as above.  Original Report Authenticated By: [name], M.D.  PFT: 05/24/2011 moderate obstructive airways disease with response to bronchodilator, air trapping, diffusion moderately reduced. FEV1 1.12/75%, FEV1/FVC 0.60, FEF 25-75% 0.47/27%. TLC 117% RV 168%, DLCO 57%.  10/18/12- 52 yoF former smoker, followed here for allergic rhinitis on  allergy vaccine, and COPD, lung nodules/ ?MAIC FOLLOWS FOR: SOB on days with bad humidity; still on vaccine and doing well. Continues allergy vaccine 1:10 GH This has not been a bad pollen season. No cough. CT chest 10/04/12 IMPRESSION:  1. Dominant right middle lobe nodule is unchanged from 01/15/2010  and is therefore considered benign.  2. Additional scattered micro nodularity has a benign or post  infectious appearance.  3. Borderline pulmonary arterial enlargement can be seen with  pulmonary arterial hypertension.  Original Report Authenticated By: [name], M.D.  12/16/13- 52 yoF former smoker, followed here for allergic rhinitis on allergy vaccine, and COPD, lung nodules/ ?MAIC, complicated by PAD FOLLOWS [id number]  Allergy vaccine 1:10 GH and doing well.  12/18/14- 89 yoF former smoker, followed here for allergic rhinitis on allergy vaccine, and COPD, lung nodules/ ?MAIC FOLLOWS FOR: Pt states she is doing well. Pt still c/o teary eyes and mild rhinorrhea. Pt states she is still on allergy vaccine 1:10 GH and doing well. She recognized increasing symptoms when she dropped off allergy shots because of a leg problem limiting transportation. Perennial sniffing and nasal drip not helped by Claritin or Zyrtec. No coughing and no shortness of breath.  12/14/2015-82 year old female former smoker followed for allergic rhinitis/watery eyes, COPD, lung nodules/? MAIC, complicated by PAD Allergy vaccine 1:10 GH FOLLOWS FOR: Pt still on allergy vaccine weekly-however she states she is unsure if they are helping; continues to have watery eyes and nasal drainage.  She says her ophthalmologist Dr. Katy Fitch has "nothing more to offer". Breathing is comfortable. Minor tachycardia cough blamed on "air quality" without wheeze for dyspnea.  Review of Systems-See HPI Constitutional:   No-  weight loss, night sweats, fevers, chills,+ fatigue, lassitude. HEENT:   No-  headaches, difficulty  swallowing, tooth/dental problems, sore throat,       No-  sneezing, itching, ear ache, nasal congestion, + post nasal drip,  CV:  No-   chest pain, orthopnea, PND, swelling in lower extremities, anasarca, dizziness, palpitations Resp: +shortness of breath with exertion or at rest.              No-   productive cough,  + non-productive cough,  No-  coughing up of blood.              No-   change in color of mucus.  No- wheezing.   Skin: No-   rash or lesions. GI:  No-   heartburn, indigestion, abdominal pain, + nausea, GU: . MS:  No-   joint pain or swelling.  Neuro- no new process Psych:  No- change in mood or affect. No depression or anxiety.  No memory loss.    Objective:   Physical Exam General- Alert, Oriented, Affect-appropriate, Distress- none acute, + Overweight Skin- rash-none, lesions- none, excoriation- none Lymphadenopathy- none Head- atraumatic            Eyes- Gross vision intact, PERRLA, conjunctivae clear secretions, not obviously "watery"            Ears- Hearing, canals-normal            Nose- +sniffing, no-Septal dev, mucus, polyps, erosion, perforation             Throat- Mallampati IV , mucosa clear , drainage- none, tonsils- atrophic Neck- flexible , trachea midline, no stridor , thyroid nl, carotid no bruit Chest - symmetrical excursion , unlabored           Heart/CV- RRR , no murmur , no gallop  , no rub, nl s1 s2                           - JVD- none , edema- none, stasis changes- none, varices- none           Lung- clear to P&A, wheeze- none, cough- none , dullness-none, rub- none           Chest wall-  Abd-  Br/ Gen/ Rectal- Not done, not indicated Extrem- cyanosis- none, clubbing, none, atrophy- none, strength- nl Neuro- grossly intact to observation; mildly tremulous

## 2015-12-14 NOTE — Patient Instructions (Addendum)
We agreed to stop your allergy shots when you run out of your current vaccine supply.  Order- CXR dx lung nodules  Please call as needed

## 2015-12-18 ENCOUNTER — Ambulatory Visit (INDEPENDENT_AMBULATORY_CARE_PROVIDER_SITE_OTHER): Payer: Medicare Other | Admitting: *Deleted

## 2015-12-18 ENCOUNTER — Ambulatory Visit (INDEPENDENT_AMBULATORY_CARE_PROVIDER_SITE_OTHER): Payer: Medicare Other | Admitting: General Practice

## 2015-12-18 DIAGNOSIS — Z5181 Encounter for therapeutic drug level monitoring: Secondary | ICD-10-CM | POA: Diagnosis not present

## 2015-12-18 DIAGNOSIS — J309 Allergic rhinitis, unspecified: Secondary | ICD-10-CM | POA: Diagnosis not present

## 2015-12-18 DIAGNOSIS — I749 Embolism and thrombosis of unspecified artery: Secondary | ICD-10-CM

## 2015-12-18 LAB — POCT INR: INR: 2.4

## 2015-12-18 NOTE — Progress Notes (Signed)
I have reviewed and agree with the plan. 

## 2015-12-21 ENCOUNTER — Ambulatory Visit: Payer: Medicare Other | Admitting: Internal Medicine

## 2015-12-25 ENCOUNTER — Ambulatory Visit (INDEPENDENT_AMBULATORY_CARE_PROVIDER_SITE_OTHER): Payer: Medicare Other | Admitting: *Deleted

## 2015-12-25 DIAGNOSIS — J309 Allergic rhinitis, unspecified: Secondary | ICD-10-CM | POA: Diagnosis not present

## 2015-12-30 ENCOUNTER — Other Ambulatory Visit: Payer: Self-pay | Admitting: Family Medicine

## 2015-12-31 ENCOUNTER — Ambulatory Visit (INDEPENDENT_AMBULATORY_CARE_PROVIDER_SITE_OTHER): Payer: Medicare Other | Admitting: Family Medicine

## 2015-12-31 ENCOUNTER — Encounter: Payer: Self-pay | Admitting: Family Medicine

## 2015-12-31 VITALS — BP 134/66 | HR 72 | Temp 98.3°F | Wt 178.8 lb

## 2015-12-31 DIAGNOSIS — E785 Hyperlipidemia, unspecified: Secondary | ICD-10-CM | POA: Diagnosis not present

## 2015-12-31 DIAGNOSIS — I779 Disorder of arteries and arterioles, unspecified: Secondary | ICD-10-CM

## 2015-12-31 DIAGNOSIS — M10072 Idiopathic gout, left ankle and foot: Secondary | ICD-10-CM

## 2015-12-31 DIAGNOSIS — M109 Gout, unspecified: Secondary | ICD-10-CM

## 2015-12-31 MED ORDER — COLCHICINE 0.6 MG PO TABS: ORAL_TABLET | ORAL | 0 refills | Status: DC

## 2015-12-31 NOTE — Patient Instructions (Signed)
Cut cholesterol medicine in half while taking this medication for gout  Gout medicine for 10 days, or stop if symptoms resolved.   Let's regroup a few weeks out from your flare to decide on changes to blood pressure medicine/ check uric acid

## 2015-12-31 NOTE — Progress Notes (Signed)
Pre visit review using our clinic review tool, if applicable. No additional management support is needed unless otherwise documented below in the visit note. 

## 2015-12-31 NOTE — Progress Notes (Signed)
Subjective:  [name] [name] is a 82 y.o. year old very pleasant female patient who presents for/with See problem oriented charting ROS- no fever, chills, nausea, vomiting.see any ROS included in HPI as well.   Past Medical History-  Patient Active Problem List   Diagnosis Date Noted  . Pain in joint, lower leg 08/28/2014    Priority: High  . Peripheral arterial disease ([place]) 06/29/2007    Priority: High  . Trigeminal neuralgia     Priority: Medium  . COPD mixed type ([place]) 01/22/2010    Priority: Medium  . Hyperglycemia 01/06/2009    Priority: Medium  . Hyperlipidemia 10/23/2006    Priority: Medium  . Essential hypertension 10/23/2006    Priority: Medium  . ESOPHAGEAL STRICTURE 08/09/1993    Priority: Medium  . Encounter for therapeutic drug monitoring 10/21/2013    Priority: Low  . Syncope 12/29/2011    Priority: Low  . Allergic rhinitis due to pollen 06/25/2010    Priority: Low  . Solitary pulmonary nodule 01/12/2010    Priority: Low  . Osteopenia 01/06/2009    Priority: Low  . ECZEMA 11/26/2007    Priority: Low    Medications- reviewed and updated Current Outpatient Prescriptions  Medication Sig Dispense Refill  . acetaminophen (TYLENOL) 500 MG tablet Take 500-1,500 mg by mouth 3 (three) times daily as needed for moderate pain. Reported on 08/31/2015    . aspirin EC 81 MG tablet Take 81 mg by mouth daily.    . calcium-vitamin D (OSCAL WITH D 500-200) 500-200 MG-UNIT per tablet Take 1 tablet by mouth daily.      . Cholecalciferol (VITAMIN D3) 1000 UNITS CAPS Take 1,000 Units by mouth daily.     Marland Kitchen glucosamine-chondroitin 500-400 MG tablet Take 1 tablet by mouth 2 (two) times daily.     Marland Kitchen glucose blood test strip Test one time a day 100 each 12  . Glycerin-Hypromellose-PEG 400 ([facility] DRY EYE RELIEF OP) Apply to eye.    . Lancets (ONETOUCH ULTRASOFT) lancets Test one time a day 100 each 12  . NONFORMULARY OR COMPOUNDED ITEM Inject 1 mL into the skin once a week. Allergy  Vaccine 1:10 Given at [facility] on Friday    . omeprazole (PRILOSEC) 20 MG capsule Take 20 mg by mouth every morning.     . simvastatin (ZOCOR) 40 MG tablet TAKE ONE TABLET BY MOUTH ONCE DAILY AT  6PM 90 tablet 2  . valsartan-hydrochlorothiazide (DIOVAN-HCT) 320-25 MG tablet Take 1 tablet by mouth daily. 90 tablet 1  . warfarin (COUMADIN) 5 MG tablet TAKE 2 & 1/2 TABLETS ON THURSDAY THEN TAKE 2 TABLETS THE REST OF THE DAYS 180 tablet 1    Objective: BP 134/66 (BP Location: Left Arm, Patient Position: Sitting, Cuff Size: Normal)   Pulse 72   Temp 98.3 F (36.8 C) (Oral)   Wt 178 lb 12.8 oz (81.1 kg)   SpO2 95%   BMI 34.92 kg/m  Gen: NAD, resting comfortably CV: RRR no murmurs rubs or gallops, 2+ DP and PT pulses left foot Lungs: CTAB no crackles, wheeze, rhonchi Ext: no pretibial edema No pain, swelling, warmth throughout left foot except centered at left great toe at MTP joint. Patient with erythema, warmth, tenderness severe at this joint with palpation.  Skin: warm, dry, no rahs except near left great toe MTP Neuro: grossly normal, moves all extremities  Assessment/Plan:  Gout S: no history of gout. No history of trauma. Noted ache in MTP of left great  toe last night then overnight she had very difficult time sleeping due to severe aching/throbbing pain inthat toe. She has warmth, swelling, erythema near the joint. Tried some tylenol and no help. Knows she should avoid nsaids on blood thinners.  A/P: appears to be first episode of gout. Patient is on HCTZ= we may have to remove this in long term- for now will treat acute episode and then see patient back in next few weeks to get a uric acid level. If she fails colcrys- we will trial prednisone likely. Higher risk patient with istory of peripheral arterial disease but she has good pulses today. History of GFR below 60 at times- possible CKD III as well but do not have to adjust colcrys at this level. Also with hyperlipidemia  history on simvastation -she will cut simvastatin in half anytime she needs colcrys   Meds ordered this encounter  Medications  . colchicine (COLCRYS) 0.6 MG tablet    Sig: Take 2 pills at first sign gout flare, then another pill 2 hours later if still having pain. Then take once daily until pain resolves.    Dispense:  30 tablet    Refill:  0    Return precautions advised.  [name], MD

## 2016-01-01 ENCOUNTER — Ambulatory Visit: Payer: Medicare Other

## 2016-01-08 ENCOUNTER — Ambulatory Visit (INDEPENDENT_AMBULATORY_CARE_PROVIDER_SITE_OTHER): Payer: Medicare Other

## 2016-01-08 ENCOUNTER — Ambulatory Visit (INDEPENDENT_AMBULATORY_CARE_PROVIDER_SITE_OTHER): Payer: Medicare Other | Admitting: General Practice

## 2016-01-08 DIAGNOSIS — Z5181 Encounter for therapeutic drug level monitoring: Secondary | ICD-10-CM

## 2016-01-08 DIAGNOSIS — I749 Embolism and thrombosis of unspecified artery: Secondary | ICD-10-CM

## 2016-01-08 DIAGNOSIS — J309 Allergic rhinitis, unspecified: Secondary | ICD-10-CM | POA: Diagnosis not present

## 2016-01-08 DIAGNOSIS — Z23 Encounter for immunization: Secondary | ICD-10-CM | POA: Diagnosis not present

## 2016-01-08 LAB — POCT INR: INR: 3.9

## 2016-01-08 NOTE — Progress Notes (Signed)
I have reviewed and agree with the plan. 

## 2016-01-12 ENCOUNTER — Telehealth: Payer: Self-pay | Admitting: Internal Medicine

## 2016-01-12 MED ORDER — AMOXICILLIN 500 MG PO TABS: 500.0000 mg | ORAL_TABLET | Freq: Three times a day (TID) | ORAL | 0 refills | Status: DC

## 2016-01-12 NOTE — Assessment & Plan Note (Signed)
She is now bothered more by perennial watery eyes than by nasal or chest symptoms. We discussed plans and the allergy program here. Plan-she will stop her allergy shots when she completes her current supply of vaccine. Okay to use antihistamines and nasal steroid sprays as needed. Symptoms are more likely vasomotor than atopic at her age now but if she has enough trouble certainly she could transfer to another allergy program in town.

## 2016-01-12 NOTE — Telephone Encounter (Signed)
Spoke with the pt  She is c/o cough and chest congestion for the past 3 days  She states that it started in her sinuses, and now in her chest  She is coughing up large amounts of yellow sputum  Had fever a dew nights ago, but none since  Please advise, thanks! Allergies  Allergen Reactions  . Sulfa Antibiotics Other (See Comments)    Cold sweat light headed and disorientation  . Tiotropium Bromide Shortness Of Breath and Other (See Comments)    Sore throat also  . Tramadol Nausea And Vomiting  . Latex Itching and Rash   Current Outpatient Prescriptions on File Prior to Visit  Medication Sig Dispense Refill  . acetaminophen (TYLENOL) 500 MG tablet Take 500-1,500 mg by mouth 3 (three) times daily as needed for moderate pain. Reported on 08/31/2015    . aspirin EC 81 MG tablet Take 81 mg by mouth daily.    . calcium-vitamin D (OSCAL WITH D 500-200) 500-200 MG-UNIT per tablet Take 1 tablet by mouth daily.      . Cholecalciferol (VITAMIN D3) 1000 UNITS CAPS Take 1,000 Units by mouth daily.     . colchicine (COLCRYS) 0.6 MG tablet Take 2 pills at first sign gout flare, then another pill 2 hours later if still having pain. Then take once daily until pain resolves. 30 tablet 0  . glucosamine-chondroitin 500-400 MG tablet Take 1 tablet by mouth 2 (two) times daily.     Marland Kitchen glucose blood test strip Test one time a day 100 each 12  . Glycerin-Hypromellose-PEG 400 ([facility] DRY EYE RELIEF OP) Apply to eye.    . Lancets (ONETOUCH ULTRASOFT) lancets Test one time a day 100 each 12  . NONFORMULARY OR COMPOUNDED ITEM Inject 1 mL into the skin once a week. Allergy Vaccine 1:10 Given at [facility] on Friday    . omeprazole (PRILOSEC) 20 MG capsule Take 20 mg by mouth every morning.     . simvastatin (ZOCOR) 40 MG tablet TAKE ONE TABLET BY MOUTH ONCE DAILY AT  6PM 90 tablet 2  . valsartan-hydrochlorothiazide (DIOVAN-HCT) 320-25 MG tablet Take 1 tablet by mouth daily. 90 tablet 1  . warfarin (COUMADIN) 5  MG tablet TAKE 2 & 1/2 TABLETS ON THURSDAY THEN TAKE 2 TABLETS THE REST OF THE DAYS 180 tablet 1   No current facility-administered medications on file prior to visit.

## 2016-01-12 NOTE — Assessment & Plan Note (Signed)
Former smoker with history of lung nodule. Plan-update chest x-ray

## 2016-01-12 NOTE — Telephone Encounter (Signed)
Spoke with pt. She is aware of CY's recommendations. Rx has been sent in. Nothing further was needed. 

## 2016-01-12 NOTE — Telephone Encounter (Signed)
Offer amoxacillin 500 mg, # 21, 1 three times daily          Suggest Mucinex DM

## 2016-01-12 NOTE — Assessment & Plan Note (Signed)
Not very symptomatic. She doesn't try to be very active. There've been no changes in symptoms to suggest concerning process.

## 2016-01-15 ENCOUNTER — Encounter: Payer: Self-pay | Admitting: Family

## 2016-01-15 ENCOUNTER — Ambulatory Visit (INDEPENDENT_AMBULATORY_CARE_PROVIDER_SITE_OTHER): Payer: Medicare Other

## 2016-01-15 DIAGNOSIS — J309 Allergic rhinitis, unspecified: Secondary | ICD-10-CM | POA: Diagnosis not present

## 2016-01-21 ENCOUNTER — Ambulatory Visit (INDEPENDENT_AMBULATORY_CARE_PROVIDER_SITE_OTHER): Payer: Medicare Other | Admitting: Family

## 2016-01-21 ENCOUNTER — Encounter: Payer: Self-pay | Admitting: Family

## 2016-01-21 ENCOUNTER — Ambulatory Visit (HOSPITAL_COMMUNITY)
Admission: RE | Admit: 2016-01-21 | Discharge: 2016-01-21 | Disposition: A | Discharge status: Home / Self Care | Payer: Medicare Other | Source: Ambulatory Visit | Attending: Vascular Surgery | Admitting: Vascular Surgery

## 2016-01-21 ENCOUNTER — Ambulatory Visit (INDEPENDENT_AMBULATORY_CARE_PROVIDER_SITE_OTHER)
Admission: RE | Admit: 2016-01-21 | Discharge: 2016-01-21 | Disposition: A | Discharge status: Home / Self Care | Payer: Medicare Other | Source: Ambulatory Visit | Attending: Vascular Surgery | Admitting: Vascular Surgery

## 2016-01-21 VITALS — BP 140/62 | HR 54 | Temp 97.0°F | Resp 14 | Ht 60.0 in | Wt 176.0 lb

## 2016-01-21 DIAGNOSIS — Z87891 Personal history of nicotine dependence: Secondary | ICD-10-CM | POA: Diagnosis not present

## 2016-01-21 DIAGNOSIS — T82858A Stenosis of vascular prosthetic devices, implants and grafts, initial encounter: Secondary | ICD-10-CM | POA: Insufficient documentation

## 2016-01-21 DIAGNOSIS — I739 Peripheral vascular disease, unspecified: Secondary | ICD-10-CM | POA: Diagnosis not present

## 2016-01-21 DIAGNOSIS — Z95828 Presence of other vascular implants and grafts: Secondary | ICD-10-CM

## 2016-01-21 DIAGNOSIS — I779 Disorder of arteries and arterioles, unspecified: Secondary | ICD-10-CM | POA: Diagnosis not present

## 2016-01-21 DIAGNOSIS — Y832 Surgical operation with anastomosis, bypass or graft as the cause of abnormal reaction of the patient, or of later complication, without mention of misadventure at the time of the procedure: Secondary | ICD-10-CM | POA: Diagnosis not present

## 2016-01-21 DIAGNOSIS — R0989 Other specified symptoms and signs involving the circulatory and respiratory systems: Secondary | ICD-10-CM | POA: Diagnosis present

## 2016-01-21 NOTE — Progress Notes (Signed)
[facility] [place]   CC: Follow up peripheral artery occlusive disease  History of Present Illness [name] [name] is a 82 y.o. female patient of Dr. Oneida Alar who is status post bilateral femoropopliteal bypass with [name] [phone] and 2009), she's also had thrombolysis angioplasty of the right femoropopliteal bypass in 2010, again on 10/13/13, and thrombectomy angioplasty right interpositional graft from above the knee to below the knee popliteal artery in 2010.  She returns today for follow up.  On 10/13/13 she underwent arteriogram by interventional radiology at [facility] of her right lower extremity. Her arteriogram revealed an occluded right femoral to below-knee popliteal bypass graft. Lysis was initiated through the left common femoral artery with a 43F sheath. Following her procedure, she was placed on a heparin drip and started on coumadin. Her left femoral sheath site had no active bleeding or hematoma. She had palpable dorsalis pedis pulses. She stayed in the hospital for an additional day after her lysis intervention due to deconditioning. After working with physical therapy, she was recommended to go home with a rolling walker. The remainder of the hospital course consisted of increasing mobilization and increasing intake of solids without difficulty. She was discharged home with home health.   Her walking is limited by low back pain, she has had lumbar fusion in March, 2013, states she was told that she has arthritis in her back which is causing this pain.  She has been having pain in her right knee since 2016 which also limits her walking. She receives injections in the knee which helps.  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   Pt states her blood pressure usually runs 130-140/60's, states it goes up in a medical office.  She no longer has claudication symptoms with walking since the above vascular procedures, she denies non healing wounds.  She denies any history  of stroke or TIA symptoms, denies cardiac problems.  She has been making a concerted effort to lose weight.   Pt Diabetic: No  Pt smoker: former smoker, quit about 1995  Pt meds include:  Statin :Yes  ASA: Yes  Other anticoagulants/antiplatelets: coumadin started by Dr. Oneida Alar July, 2015, and chronically managed by her PCP, was Dr. Arnoldo Morale, then Dr. Yong Channel    Past Medical History:  Diagnosis Date  . Allergic rhinoconjunctivitis   . Anxiety    pt. managed- uses deep breathing   . Arthritis    low back , stenosis  . COLONIC POLYPS, RECURRENT 08/29/2006   2008 last colonoscopy. No further colonoscopy.     Marland Kitchen COPD (chronic obstructive pulmonary disease) ([place])   . Diverticulosis   . DVT (deep venous thrombosis) ([place])   . Eczema   . Environmental allergies    allergy shot- q friday in Dr. Janee Morn office. PFT's abnormal- recommended Spiriva to use preop & will d/c after surgery  . GERD (gastroesophageal reflux disease)   . H/O hiatal hernia   . History of colonic polyps   . Hyperlipidemia   . Hypertension   . Lung nodule 2011  . Peripheral vascular disease ([place])   . Shingles   . Stenosis of popliteal artery ([facility])    blood clots in legs long ago    . Trigeminal neuralgia     Social History Social History  Substance Use Topics  . Smoking status: Former Smoker    Packs/day: 2.00    Years: 30.00    Types: Cigarettes    Quit date: 06/18/1993  . Smokeless tobacco: Never  Used     Comment: QUIT IN 1995  . Alcohol use No    Family History Family History  Problem Relation Age of Onset  . Arthritis Mother   . Hypertension Mother   . Heart disease Father   . Colon polyps Father   . Breast cancer Sister   . Lung cancer Sister   . Colon cancer Sister   . Cancer Sister     Breast  . Lung cancer Brother   . Cancer Brother     Lung  . Anesthesia problems Neg Hx   . Hypotension Neg Hx   . Malignant hyperthermia Neg Hx   . Pseudochol deficiency Neg Hx   . Cancer  Daughter     Breast  . Hypertension Son     Past Surgical History:  Procedure Laterality Date  . ABDOMINAL AORTAGRAM N/A 06/17/2011   Procedure: ABDOMINAL Maxcine Ham;  Surgeon: Elam Dutch, MD;  [facility] C[facility] LAB;  Service: Cardiovascular;  Laterality: N/A;  . CHOLECYSTECTOMY  1998  . EYE SURGERY     cataracts removed- bilateral /w IOL  . FEMORAL-POPLITEAL BYPASS GRAFT        x2 surgeries 1990's & 2009  . INJECTION KNEE Right Aug. 2016   Gel injection for pain  . LUMBAR FUSION  07/06/2011  . TONSILLECTOMY     as a teenager   . TUBAL LIGATION      Allergies  Allergen Reactions  . Sulfa Antibiotics Other (See Comments)    Cold sweat light headed and disorientation  . Tiotropium Bromide Shortness Of Breath and Other (See Comments)    Sore throat also  . Tramadol Nausea And Vomiting  . Latex Itching and Rash    Current Outpatient Prescriptions  Medication Sig Dispense Refill  . acetaminophen (TYLENOL) 500 MG tablet Take 500-1,500 mg by mouth 3 (three) times daily as needed for moderate pain. Reported on 08/31/2015    . aspirin EC 81 MG tablet Take 81 mg by mouth daily.    . calcium-vitamin D (OSCAL WITH D 500-200) 500-200 MG-UNIT per tablet Take 1 tablet by mouth daily.      . Cholecalciferol (VITAMIN D3) 1000 UNITS CAPS Take 1,000 Units by mouth daily.     . colchicine (COLCRYS) 0.6 MG tablet Take 2 pills at first sign gout flare, then another pill 2 hours later if still having pain. Then take once daily until pain resolves. 30 tablet 0  . glucosamine-chondroitin 500-400 MG tablet Take 1 tablet by mouth 2 (two) times daily.     Marland Kitchen glucose blood test strip Test one time a day 100 each 12  . Glycerin-Hypromellose-PEG 400 ([facility] DRY EYE RELIEF OP) Apply to eye.    . Lancets (ONETOUCH ULTRASOFT) lancets Test one time a day 100 each 12  . NONFORMULARY OR COMPOUNDED ITEM Inject 1 mL into the skin once a week. Allergy Vaccine 1:10 Given at [facility] on Friday    .  omeprazole (PRILOSEC) 20 MG capsule Take 20 mg by mouth every morning.     . simvastatin (ZOCOR) 40 MG tablet TAKE ONE TABLET BY MOUTH ONCE DAILY AT  6PM 90 tablet 2  . valsartan-hydrochlorothiazide (DIOVAN-HCT) 320-25 MG tablet Take 1 tablet by mouth daily. 90 tablet 1  . warfarin (COUMADIN) 5 MG tablet TAKE 2 & 1/2 TABLETS ON THURSDAY THEN TAKE 2 TABLETS THE REST OF THE DAYS 180 tablet 1   No current facility-administered medications for this visit.     ROS:  See HPI for pertinent positives and negatives.   Physical Examination  Vitals:   01/21/16 1255  BP: 140/62  Pulse: (!) 54  Resp: 14  Temp: 97 F (36.1 C)  TempSrc: Oral  SpO2: 97%  Weight: 176 lb (79.8 kg)  Height: 5' (1.524 m)   Body mass index is 34.37 kg/m.  General: A&O x 3, WDWN, obese female.  Gait: slow, deliberate  Eyes: PERRLA.  Pulmonary: Respirations are non labored, CTAB, no wheezes, rales or rhonchi.  Cardiac: regular rhythm, no detected murmur.   Carotid Bruits  Left  Right    Negative  Negative    Aorta is not palpable.  Radial pulses: 2+ palpable and =.   VASCULAR EXAM:  Extremities without ischemic changes  without Gangrene; without open wounds.   LE Pulses  LEFT  RIGHT   FEMORAL  not palpable (obese) not palpable (obese)  POPLITEAL  not palpable  not palpable   POSTERIOR TIBIAL  not palpable  not palpable   DORSALIS PEDIS  ANTERIOR TIBIAL  1+palpable  1+palpable    Abdomen: soft, NT, no masses palpated.  Skin: no rashes, no ulcers.  Musculoskeletal: no muscle wasting or atrophy.  Neurologic: A&O X 3; Appropriate Affect ; SENSATION: normal; MOTOR FUNCTION: moving all extremities equally, motor strength 5/5 throughout. Speech is fluent/normal. CN 2-12 intact.    ASSESSMENT: SHARAIN WOTTON is a 82 y.o. female who is status post bilateral femoropopliteal bypass with [name] [phone] and 2009), she's also had thrombolysis angioplasty of the right  femoropopliteal bypass in 2010, again on 10/13/13,and thrombectomy angioplasty right interpositional graft from above the knee to below the knee popliteal artery in 2010.  She no longer has claudication symptoms with walking since the above vascular procedures, but she probably does not walk very much since her walking is limited by back pain and right knee pain. She has no signs of ischemia in her feet/legs.  She states she has been doing daily seated leg exercises.  DATA Today's bilateral LE arterial duplex suggests right lower extremity bypass graft with proximal anastomosis stenosis of 50-70% (376 cm/s). Left LE arterial bypass graft with no evidence of restenosis; however, likely >50% stenosis of the left common femoral artery.  Possible greater than 50% stenosis of the left proximal CFA.  ABI's have declined bilaterally from 0.90 to 0.71 on the right, and from 0.92 to 0.70 on the left with all biphasic waveforms.    PLAN:  Continued daily seated leg exercises; consider adding senior water aerobics as discussed.  Based on the patient's vascular studies and examination, pt will return to clinic in 6 months with ABI's and bilateral LE arterial duplex.   I discussed in depth with the patient the nature of atherosclerosis, and emphasized the importance of maximal medical management including strict control of blood pressure, blood glucose, and lipid levels, obtaining regular exercise, and continued cessation of smoking.  The patient is aware that without maximal medical management the underlying atherosclerotic disease process will progress, limiting the benefit of any interventions.  The patient was given information about PAD including signs, symptoms, treatment, what symptoms should prompt the patient to seek immediate medical care, and risk reduction measures to take.  Clemon Chambers, RN, MSN, FNP-C [facility] Phone: [phone]  Clinic MD:  Bridgett Larsson  01/21/16 1:00 PM

## 2016-01-21 NOTE — Patient Instructions (Signed)
Peripheral Vascular Disease Peripheral vascular disease (PVD) is a disease of the blood vessels that are not part of your heart and brain. A simple term for PVD is poor circulation. In most cases, PVD narrows the blood vessels that carry blood from your heart to the rest of your body. This can result in a decreased supply of blood to your arms, legs, and internal organs, like your stomach or kidneys. However, it most often affects a person's lower legs and feet. There are two types of PVD.  Organic PVD. This is the more common type. It is caused by damage to the structure of blood vessels.  Functional PVD. This is caused by conditions that make blood vessels contract and tighten (spasm). Without treatment, PVD tends to get worse over time. PVD can also lead to acute ischemic limb. This is when an arm or limb suddenly has trouble getting enough blood. This is a medical emergency. CAUSES Each type of PVD has many different causes. The most common cause of PVD is buildup of a fatty material (plaque) inside of your arteries (atherosclerosis). Small amounts of plaque can break off from the walls of the blood vessels and become lodged in a smaller artery. This blocks blood flow and can cause acute ischemic limb. Other common causes of PVD include:  Blood clots that form inside of blood vessels.  Injuries to blood vessels.  Diseases that cause inflammation of blood vessels or cause blood vessel spasms.  Health behaviors and health history that increase your risk of developing PVD. RISK FACTORS  You may have a greater risk of PVD if you:  Have a family history of PVD.  Have certain medical conditions, including:  High cholesterol.  Diabetes.  High blood pressure (hypertension).  Coronary heart disease.  Past problems with blood clots.  Past injury, such as burns or a broken bone. These may have damaged blood vessels in your limbs.  Buerger disease. This is caused by inflamed blood  vessels in your hands and feet.  Some forms of arthritis.  Rare birth defects that affect the arteries in your legs.  Use tobacco.  Do not get enough exercise.  Are obese.  Are age 50 or older. SIGNS AND SYMPTOMS  PVD may cause many different symptoms. Your symptoms depend on what part of your body is not getting enough blood. Some common signs and symptoms include:  Cramps in your lower legs. This may be a symptom of poor leg circulation (claudication).  Pain and weakness in your legs while you are physically active that goes away when you rest (intermittent claudication).  Leg pain when at rest.  Leg numbness, tingling, or weakness.  Coldness in a leg or foot, especially when compared with the other leg.  Skin or hair changes. These can include:  Hair loss.  Shiny skin.  Pale or bluish skin.  Thick toenails.  Inability to get or maintain an erection (erectile dysfunction). People with PVD are more prone to developing ulcers and sores on their toes, feet, or legs. These may take longer than normal to heal. DIAGNOSIS Your health care provider may diagnose PVD from your signs and symptoms. The health care provider will also do a physical exam. You may have tests to find out what is causing your PVD and determine its severity. Tests may include:  Blood pressure recordings from your arms and legs and measurements of the strength of your pulses (pulse volume recordings).  Imaging studies using sound waves to take pictures of   the blood flow through your blood vessels (Doppler ultrasound).  Injecting a dye into your blood vessels before having imaging studies using:  X-rays (angiogram or arteriogram).  Computer-generated X-rays (CT angiogram).  A powerful electromagnetic field and a computer (magnetic resonance angiogram or MRA). TREATMENT Treatment for PVD depends on the cause of your condition and the severity of your symptoms. It also depends on your age. Underlying  causes need to be treated and controlled. These include long-lasting (chronic) conditions, such as diabetes, high cholesterol, and high blood pressure. You may need to first try making lifestyle changes and taking medicines. Surgery may be needed if these do not work. Lifestyle changes may include:  Quitting smoking.  Exercising regularly.  Following a low-fat, low-cholesterol diet. Medicines may include:  Blood thinners to prevent blood clots.  Medicines to improve blood flow.  Medicines to improve your blood cholesterol levels. Surgical procedures may include:  A procedure that uses an inflated balloon to open a blocked artery and improve blood flow (angioplasty).  A procedure to put in a tube (stent) to keep a blocked artery open (stent implant).  Surgery to reroute blood flow around a blocked artery (peripheral bypass surgery).  Surgery to remove dead tissue from an infected wound on the affected limb.  Amputation. This is surgical removal of the affected limb. This may be necessary in cases of acute ischemic limb that are not improved through medical or surgical treatments. HOME CARE INSTRUCTIONS  Take medicines only as directed by your health care provider.  Do not use any tobacco products, including cigarettes, chewing tobacco, or electronic cigarettes. If you need help quitting, ask your health care provider.  Lose weight if you are overweight, and maintain a healthy weight as directed by your health care provider.  Eat a diet that is low in fat and cholesterol. If you need help, ask your health care provider.  Exercise regularly. Ask your health care provider to suggest some good activities for you.  Use compression stockings or other mechanical devices as directed by your health care provider.  Take good care of your feet.  Wear comfortable shoes that fit well.  Check your feet often for any cuts or sores. SEEK MEDICAL CARE IF:  You have cramps in your legs  while walking.  You have leg pain when you are at rest.  You have coldness in a leg or foot.  Your skin changes.  You have erectile dysfunction.  You have cuts or sores on your feet that are not healing. SEEK IMMEDIATE MEDICAL CARE IF:  Your arm or leg turns cold and blue.  Your arms or legs become red, warm, swollen, painful, or numb.  You have chest pain or trouble breathing.  You suddenly have weakness in your face, arm, or leg.  You become very confused or lose the ability to speak.  You suddenly have a very bad headache or lose your vision.   This information is not intended to replace advice given to you by your health care provider. Make sure you discuss any questions you have with your health care provider.   Document Released: 05/19/2004 Document Revised: 05/02/2014 Document Reviewed: 09/19/2013 Elsevier Interactive Patient Education 2016 [employer]  

## 2016-01-22 ENCOUNTER — Ambulatory Visit (INDEPENDENT_AMBULATORY_CARE_PROVIDER_SITE_OTHER): Payer: Medicare Other

## 2016-01-22 DIAGNOSIS — J309 Allergic rhinitis, unspecified: Secondary | ICD-10-CM | POA: Diagnosis not present

## 2016-01-25 ENCOUNTER — Telehealth: Payer: Self-pay | Admitting: Family

## 2016-01-25 NOTE — Telephone Encounter (Signed)
-----   Message from Viann Fish, NP sent at 01/25/2016  9:10 AM EDT ----- Regarding: FW: 22% decline in both ABI's Please cancel bilateral LE arterial duplex, and keep ABI's scheduled, see me afterward, Thank you, [name] [name]  ----- Message ----- From: Elam Dutch, MD Sent: 01/25/2016   7:41 AM To: Sharmon Leyden Nickel, NP Subject: RE: 22% decline in both ABI's                  Would just get ABIs due to lack of symptoms and overall all health decline  Juanda Crumble ----- Message ----- From: Viann Fish, NP Sent: 01/21/2016   1:34 PM To: Elam Dutch, MD, Sharmon Leyden Nickel, NP Subject: 22% decline in both ABI's                      Dr. Oneida Alar, Pt's ABI's declined 22% bilaterally since March 2017. 376 cm/s velocity in right leg bypass proximal anastomosis. She is doing daily seated leg exercises, but does not walk much due to low back and right OA pain. I have her returning in 6 months with same testing. I this OK or does she need a CTA with run off?  Thank you, Peyton Keefauver is a 82 y.o. female who is status post bilateral femoropopliteal bypass with [name] [phone] and 2009), she's also had thrombolysis angioplasty of the right femoropopliteal bypass in 2010, again on 10/13/13,and thrombectomy angioplasty right interpositional graft from above the knee to below the knee popliteal artery in 2010.  She no longer has claudication symptoms with walking since the above vascular procedures, but she probably does not walk very much since her walking is limited by back pain and right knee pain. She has no signs of ischemia in her feet/legs.  She states she has been doing daily seated leg exercises.  DATA Today's bilateral LE arterial duplex suggests right lower extremity bypass graft with proximal anastomosis stenosis of 50-70% (376 cm/s). Left LE arterial bypass graft with no evidence of restenosis; however, likely >50% stenosis of the left common femoral artery.   Possible greater than 50% stenosis of the left proximal CFA.  ABI's have declined bilaterally from 0.90 to 0.71 on the right, and from 0.92 to 0.70 on the left with all biphasic waveforms.    PLAN:  Continued daily seated leg exercises; consider adding senior water aerobics as discussed.  Based on the patient's vascular studies and examination, pt will return to clinic in 6 months with ABI's and bilateral LE arterial duplex.

## 2016-01-25 NOTE — Telephone Encounter (Signed)
Letter mailed for appt on 07/21/16 for [place] and f/u with NP

## 2016-01-29 ENCOUNTER — Ambulatory Visit (INDEPENDENT_AMBULATORY_CARE_PROVIDER_SITE_OTHER): Payer: Medicare Other | Admitting: *Deleted

## 2016-01-29 DIAGNOSIS — J309 Allergic rhinitis, unspecified: Secondary | ICD-10-CM

## 2016-02-05 ENCOUNTER — Ambulatory Visit (INDEPENDENT_AMBULATORY_CARE_PROVIDER_SITE_OTHER): Payer: Medicare Other | Admitting: General Practice

## 2016-02-05 ENCOUNTER — Ambulatory Visit (INDEPENDENT_AMBULATORY_CARE_PROVIDER_SITE_OTHER): Payer: Medicare Other

## 2016-02-05 DIAGNOSIS — Z5181 Encounter for therapeutic drug level monitoring: Secondary | ICD-10-CM | POA: Diagnosis not present

## 2016-02-05 DIAGNOSIS — I749 Embolism and thrombosis of unspecified artery: Secondary | ICD-10-CM | POA: Diagnosis not present

## 2016-02-05 DIAGNOSIS — J309 Allergic rhinitis, unspecified: Secondary | ICD-10-CM | POA: Diagnosis not present

## 2016-02-05 LAB — POCT INR: INR: 2.6

## 2016-02-12 ENCOUNTER — Ambulatory Visit (INDEPENDENT_AMBULATORY_CARE_PROVIDER_SITE_OTHER): Payer: Medicare Other | Admitting: *Deleted

## 2016-02-12 ENCOUNTER — Telehealth: Payer: Self-pay | Admitting: Internal Medicine

## 2016-02-12 DIAGNOSIS — J309 Allergic rhinitis, unspecified: Secondary | ICD-10-CM | POA: Diagnosis not present

## 2016-02-12 NOTE — Telephone Encounter (Signed)
Message received from [place]: [name] [name], [name] [name] dob 2024-09-04 is a Psychologist, occupational at [employer].. She got her flu shot here in Sept./2017. They told they need proof that she got the shot. Today is the deadline will you please see at proof is e-mailed to [email or web address], ph# [phone].   Unable to e-mail this information the .[email or web address] e-mail above.  Did send e-mail to pt's mychart account.  Called spoke with patient to inform her of the above.  Pt stated she is unable to log in to her mychart account - she has forgotten the password.  Asked pt for a fax number - she is unable to provide this information.  Offered to call the work number provided > pt advised to speak with [place].  Called spoke with [place] and described the above situation.  She asked that the letter be emailed to her.  Explained unable to do this and asked for a fax number.  Lawerance Sabal reported she does not have a fax number.  She did offer that we try [phone].  Letter printed and faxed to that number.  Nothing further needed; will sign off.

## 2016-02-19 ENCOUNTER — Ambulatory Visit (INDEPENDENT_AMBULATORY_CARE_PROVIDER_SITE_OTHER): Payer: Medicare Other | Admitting: *Deleted

## 2016-02-19 DIAGNOSIS — J309 Allergic rhinitis, unspecified: Secondary | ICD-10-CM

## 2016-02-26 ENCOUNTER — Ambulatory Visit (INDEPENDENT_AMBULATORY_CARE_PROVIDER_SITE_OTHER): Payer: Medicare Other

## 2016-02-26 DIAGNOSIS — J309 Allergic rhinitis, unspecified: Secondary | ICD-10-CM | POA: Diagnosis not present

## 2016-03-04 ENCOUNTER — Ambulatory Visit (INDEPENDENT_AMBULATORY_CARE_PROVIDER_SITE_OTHER): Payer: Medicare Other

## 2016-03-04 ENCOUNTER — Ambulatory Visit (INDEPENDENT_AMBULATORY_CARE_PROVIDER_SITE_OTHER): Payer: Medicare Other | Admitting: General Practice

## 2016-03-04 DIAGNOSIS — I749 Embolism and thrombosis of unspecified artery: Secondary | ICD-10-CM

## 2016-03-04 DIAGNOSIS — J309 Allergic rhinitis, unspecified: Secondary | ICD-10-CM

## 2016-03-04 DIAGNOSIS — Z5181 Encounter for therapeutic drug level monitoring: Secondary | ICD-10-CM

## 2016-03-04 LAB — POCT INR: INR: 2.8

## 2016-03-04 NOTE — Patient Instructions (Signed)
Pre visit review using our clinic review tool, if applicable. No additional management support is needed unless otherwise documented below in the visit note. 

## 2016-03-04 NOTE — Progress Notes (Signed)
I have reviewed and agree with the plan. 

## 2016-03-07 ENCOUNTER — Ambulatory Visit: Payer: Medicare Other | Admitting: Family Medicine

## 2016-03-11 ENCOUNTER — Ambulatory Visit (INDEPENDENT_AMBULATORY_CARE_PROVIDER_SITE_OTHER): Payer: Medicare Other | Admitting: *Deleted

## 2016-03-11 DIAGNOSIS — J309 Allergic rhinitis, unspecified: Secondary | ICD-10-CM

## 2016-03-18 ENCOUNTER — Ambulatory Visit: Payer: Medicare Other

## 2016-03-21 ENCOUNTER — Telehealth (INDEPENDENT_AMBULATORY_CARE_PROVIDER_SITE_OTHER): Payer: Self-pay | Admitting: Orthopaedic Surgery

## 2016-03-21 NOTE — Telephone Encounter (Signed)
Patient would like for you to order the gel injection for the R knee.  Please call her when it is approved with medicare and bcbs...she will then make appt.  Please do ASAP she is uncomfortable.

## 2016-03-22 ENCOUNTER — Ambulatory Visit (INDEPENDENT_AMBULATORY_CARE_PROVIDER_SITE_OTHER): Payer: Medicare Other | Admitting: Family Medicine

## 2016-03-22 ENCOUNTER — Encounter: Payer: Self-pay | Admitting: Family Medicine

## 2016-03-22 VITALS — BP 138/78 | HR 61 | Temp 97.3°F | Ht 60.0 in | Wt 179.8 lb

## 2016-03-22 DIAGNOSIS — M5441 Lumbago with sciatica, right side: Secondary | ICD-10-CM

## 2016-03-22 DIAGNOSIS — M109 Gout, unspecified: Secondary | ICD-10-CM | POA: Diagnosis not present

## 2016-03-22 DIAGNOSIS — G8929 Other chronic pain: Secondary | ICD-10-CM

## 2016-03-22 DIAGNOSIS — I779 Disorder of arteries and arterioles, unspecified: Secondary | ICD-10-CM | POA: Diagnosis not present

## 2016-03-22 DIAGNOSIS — I10 Essential (primary) hypertension: Secondary | ICD-10-CM

## 2016-03-22 DIAGNOSIS — R739 Hyperglycemia, unspecified: Secondary | ICD-10-CM

## 2016-03-22 MED ORDER — LIDOCAINE 5 % EX PTCH: MEDICATED_PATCH | CUTANEOUS | 0 refills | Status: DC

## 2016-03-22 NOTE — Assessment & Plan Note (Signed)
S: resolved quickly on colchicine after last visit. Still on hctz A/P: get uric acid next visit likely- discussed risks with hctz wants to stay on for now given borderline BP

## 2016-03-22 NOTE — Progress Notes (Signed)
Pre visit review using our clinic review tool, if applicable. No additional management support is needed unless otherwise documented below in the visit note. 

## 2016-03-22 NOTE — Assessment & Plan Note (Addendum)
S: Low back pain for several years. Some radiation into right legs at times. Did PT 3-4x in the past- helped some. Tramadol makes her nauseous. Avoiding NSAIDs due to coumadin. Tylenol helps some but looks forward to the next dose. Struggles to get out of the bed in the morning. Lidocaine patches are too expensive but help some. Steroid injections does not think these helped in the past but no recent use.  A/P: refilled lidocaine patches used in the past- every other day advised due to geriatric warning. I discussed with her do not think narcotics beyond tramadol are ideal. She wants to explore other modalities for pain management- will refer to pain management

## 2016-03-22 NOTE — Assessment & Plan Note (Signed)
S: controlled on valsartan- hctz 320-25mg  per jnc 8 BP Readings from Last 3 Encounters:  03/22/16 138/78  01/21/16 140/62  12/31/15 134/66  A/P:Continue current meds:  Discussed new guidelines- given patient age 82- we opted to target <140/90

## 2016-03-22 NOTE — Progress Notes (Signed)
Subjective:  [name] [name] is a 82 y.o. year old very pleasant female patient who presents for/with See problem oriented charting ROS- chronic lower back pain. No chest pain or shortness of breath. No redness over lower back.see any ROS included in HPI as well.   Past Medical History-  Patient Active Problem List   Diagnosis Date Noted  . Pain in joint, lower leg 08/28/2014    Priority: High  . Peripheral arterial disease ([place]) 06/29/2007    Priority: High  . Trigeminal neuralgia     Priority: Medium  . Chronic low back pain 09/13/2010    Priority: Medium  . COPD mixed type ([place]) 01/22/2010    Priority: Medium  . Hyperglycemia 01/06/2009    Priority: Medium  . Hyperlipidemia 10/23/2006    Priority: Medium  . Essential hypertension 10/23/2006    Priority: Medium  . ESOPHAGEAL STRICTURE 08/09/1993    Priority: Medium  . Encounter for therapeutic drug monitoring 10/21/2013    Priority: Low  . Syncope 12/29/2011    Priority: Low  . Allergic rhinitis due to pollen 06/25/2010    Priority: Low  . Solitary pulmonary nodule 01/12/2010    Priority: Low  . Osteopenia 01/06/2009    Priority: Low  . ECZEMA 11/26/2007    Priority: Low  . Acute gout of left foot 03/22/2016    Medications- reviewed and updated Current Outpatient Prescriptions  Medication Sig Dispense Refill  . acetaminophen (TYLENOL) 500 MG tablet Take 500-1,500 mg by mouth 3 (three) times daily as needed for moderate pain. Reported on 08/31/2015    . aspirin EC 81 MG tablet Take 81 mg by mouth daily.    . calcium-vitamin D (OSCAL WITH D 500-200) 500-200 MG-UNIT per tablet Take 1 tablet by mouth daily.      . Cholecalciferol (VITAMIN D3) 1000 UNITS CAPS Take 1,000 Units by mouth daily.     . colchicine (COLCRYS) 0.6 MG tablet Take 2 pills at first sign gout flare, then another pill 2 hours later if still having pain. Then take once daily until pain resolves. 30 tablet 0  . glucosamine-chondroitin 500-400 MG tablet  Take 1 tablet by mouth 2 (two) times daily.     Marland Kitchen glucose blood test strip Test one time a day 100 each 12  . Lancets (ONETOUCH ULTRASOFT) lancets Test one time a day 100 each 12  . NONFORMULARY OR COMPOUNDED ITEM Inject 1 mL into the skin once a week. Allergy Vaccine 1:10 Given at [facility] on Friday    . omeprazole (PRILOSEC) 20 MG capsule Take 20 mg by mouth every morning.     . simvastatin (ZOCOR) 40 MG tablet TAKE ONE TABLET BY MOUTH ONCE DAILY AT  6PM 90 tablet 2  . valsartan-hydrochlorothiazide (DIOVAN-HCT) 320-25 MG tablet Take 1 tablet by mouth daily. 90 tablet 1  . warfarin (COUMADIN) 5 MG tablet TAKE 2 & 1/2 TABLETS ON THURSDAY THEN TAKE 2 TABLETS THE REST OF THE DAYS (Patient taking differently: TAKE 1 tablet per day) 180 tablet 1   No current facility-administered medications for this visit.     Objective: BP 138/78   Pulse 61   Temp 97.3 F (36.3 C) (Oral)   Ht 5' (1.524 m)   Wt 179 lb 12.8 oz (81.6 kg)   SpO2 95%   BMI 35.11 kg/m  Gen: NAD, resting comfortably CV: RRR no murmurs rubs or gallops Lungs: CTAB no crackles, wheeze, rhonchi Ext: no edema Skin: warm, dry Slow gait  Assessment/Plan:  Essential hypertension S: controlled on valsartan- hctz 320-25mg  per jnc 8 BP Readings from Last 3 Encounters:  03/22/16 138/78  01/21/16 140/62  12/31/15 134/66  A/P:Continue current meds:  Discussed new guidelines- given patient age 6- we opted to target <140/90  Hyperglycemia S: no longer taking metformin as got blurry vision on this. Checks some morning sugars- sometimes as high as 130.  Lab Results  Component Value Date   HGBA1C 6.3 09/10/2015  A/P: patient wants to remain off medicine- wants to get a1c check next visit. I am concerned about progression to diabetes.   Acute gout of left foot S: resolved quickly on colchicine after last visit. Still on hctz A/P: get uric acid next visit likely- discussed risks with hctz wants to stay on for now given  borderline BP   Chronic low back pain S: Low back pain for several years. Some radiation into right legs at times. Did PT 3-4x in the past- helped some. Tramadol makes her nauseous. Avoiding NSAIDs due to coumadin. Tylenol helps some but looks forward to the next dose. Struggles to get out of the bed in the morning. Lidocaine patches are too expensive but help some. Steroid injections does not think these helped in the past but no recent use.  A/P: refilled lidocaine patches used in the past- every other day advised due to geriatric warning. I discussed with her do not think narcotics beyond tramadol are ideal. She wants to explore other modalities for pain management- will refer to pain management   Return in about 3 months (around 06/22/2016). Fasting bloodwork next visit Need AWV plan  Orders Placed This Encounter  Procedures  . Ambulatory referral to Pain Clinic    Referral Priority:   Routine    Referral Type:   Consultation    Referral Reason:   Specialty Services Required    Requested Specialty:   Pain Medicine    Number of Visits Requested:   1    Meds ordered this encounter  Medications  . lidocaine (LIDODERM) 5 %    Sig: Use every other day as needed. Remove & Discard patch within 12 hours or as directed by MD    Dispense:  30 patch    Refill:  0    Return precautions advised.  [name], MD

## 2016-03-22 NOTE — Assessment & Plan Note (Signed)
S: no longer taking metformin as got blurry vision on this. Checks some morning sugars- sometimes as high as 130.  Lab Results  Component Value Date   HGBA1C 6.3 09/10/2015  A/P: patient wants to remain off medicine- wants to get a1c check next visit. I am concerned about progression to diabetes.

## 2016-03-22 NOTE — Patient Instructions (Signed)
Come in morning for next visit and update bloodwork  Try lidocaine patches every other day for the back.   Refer to pain management

## 2016-03-23 ENCOUNTER — Telehealth: Payer: Self-pay

## 2016-03-23 NOTE — Telephone Encounter (Signed)
Mono

## 2016-03-23 NOTE — Telephone Encounter (Signed)
Is this okay which gel injection? 1. Monovisc or 2. Synvisc

## 2016-03-23 NOTE — Telephone Encounter (Signed)
Received PA request from [facility] for Lidocaine patches. PA submitted & is pending. Key: [id number]

## 2016-03-23 NOTE — Telephone Encounter (Signed)
[employer] medic[employer] needs more clinical information,  Need to know dx and if the pt has any FDA labeled contra indication to therapy with lidocaine patch New information ASAP. Please call:  [phone]  Option 5

## 2016-03-23 NOTE — Telephone Encounter (Signed)
Pt aware.

## 2016-03-23 NOTE — Telephone Encounter (Signed)
Called and spoke with insurance. Answered all remaining questions & they will call back when a decision has been made.

## 2016-03-23 NOTE — Telephone Encounter (Signed)
I will submit application for monovisc for right knee

## 2016-03-25 ENCOUNTER — Ambulatory Visit (INDEPENDENT_AMBULATORY_CARE_PROVIDER_SITE_OTHER): Payer: Medicare Other

## 2016-03-25 DIAGNOSIS — J309 Allergic rhinitis, unspecified: Secondary | ICD-10-CM

## 2016-03-28 ENCOUNTER — Telehealth: Payer: Self-pay | Admitting: *Deleted

## 2016-03-28 NOTE — Telephone Encounter (Addendum)
Called pt. Per her and CY's discussion she is stopping allergy vaccine. She couldn't tell if it was helping or not. CY said this would be a good time to see how she does without the vac.. If she thinks she needs to go back on the shots for her to call back and we would refer her and send her the letter. Nothing further needed.

## 2016-03-29 NOTE — Telephone Encounter (Signed)
PA denied because it I snot being prescribed for a FDA labeled or medically accepted use.

## 2016-03-29 NOTE — Telephone Encounter (Signed)
Please inform patient insurance wont cover patches- can pay out of pocket if she wants.

## 2016-03-30 NOTE — Telephone Encounter (Signed)
Spoke with patient who verbalized understanding. She states she can not afford the patches out of pocket as they cost $6-7 a patch

## 2016-04-01 ENCOUNTER — Ambulatory Visit: Payer: Medicare Other

## 2016-04-04 NOTE — Telephone Encounter (Signed)
Called pt to let her know inj was approved thru ins to buy and bill. I advised her if she wanted to know exact number she would have to call her ins.  She sched appt for Thursday.

## 2016-04-04 NOTE — Telephone Encounter (Signed)
Patient is calling to check the status of knee injections.  Cb#: [phone] She said to leave a detailed message if there is no answer.

## 2016-04-07 ENCOUNTER — Ambulatory Visit (INDEPENDENT_AMBULATORY_CARE_PROVIDER_SITE_OTHER): Payer: Medicare Other | Admitting: Orthopaedic Surgery

## 2016-04-07 DIAGNOSIS — M1711 Unilateral primary osteoarthritis, right knee: Secondary | ICD-10-CM | POA: Diagnosis not present

## 2016-04-07 MED ORDER — HYALURONAN 88 MG/4ML IX SOSY
88.0000 mg | PREFILLED_SYRINGE | INTRA_ARTICULAR | Status: AC | PRN
  Administered 2016-04-07: 88 mg via INTRA_ARTICULAR

## 2016-04-07 MED ORDER — HYDROCODONE-ACETAMINOPHEN 5-325 MG PO TABS: 1.0000 | ORAL_TABLET | Freq: Every day | ORAL | 0 refills | Status: DC | PRN

## 2016-04-07 NOTE — Progress Notes (Signed)
Patient underwent model disc injection into her right knee today under sterile conditions patient tolerated this well.

## 2016-04-07 NOTE — Progress Notes (Signed)
   Procedure Note  Patient: [name] [name]             Date of Birth: 06/25/1924           MRN: [id number]             Visit Date: 04/07/2016  Procedures: Visit Diagnoses: No diagnosis found.  Large Joint Inj Date/Time: 04/07/2016 9:05 PM Performed by: Leandrew Koyanagi Authorized by: Leandrew Koyanagi   Consent Given by:  Patient Timeout: prior to procedure the correct patient, procedure, and site was verified   Indications:  Pain Location:  Knee Site:  R knee Prep: patient was prepped and draped in usual sterile fashion   Needle Size:  22 G Approach:  Anterolateral Ultrasound Guidance: No   Fluoroscopic Guidance: No   Arthrogram: No   Medications:  88 mg Hyaluronan 88 MG/4ML

## 2016-04-08 ENCOUNTER — Ambulatory Visit (INDEPENDENT_AMBULATORY_CARE_PROVIDER_SITE_OTHER): Payer: Medicare Other | Admitting: General Practice

## 2016-04-08 DIAGNOSIS — I749 Embolism and thrombosis of unspecified artery: Secondary | ICD-10-CM

## 2016-04-08 DIAGNOSIS — Z5181 Encounter for therapeutic drug level monitoring: Secondary | ICD-10-CM

## 2016-04-08 LAB — POCT INR: INR: 3.8

## 2016-04-08 NOTE — Patient Instructions (Signed)
Pre visit review using our clinic review tool, if applicable. No additional management support is needed unless otherwise documented below in the visit note. 

## 2016-04-10 NOTE — Progress Notes (Signed)
I have reviewed and agree with the plan. 

## 2016-04-29 ENCOUNTER — Ambulatory Visit (INDEPENDENT_AMBULATORY_CARE_PROVIDER_SITE_OTHER): Payer: Medicare Other | Admitting: General Practice

## 2016-04-29 DIAGNOSIS — I749 Embolism and thrombosis of unspecified artery: Secondary | ICD-10-CM | POA: Diagnosis not present

## 2016-04-29 DIAGNOSIS — Z5181 Encounter for therapeutic drug level monitoring: Secondary | ICD-10-CM

## 2016-04-29 LAB — POCT INR: INR: 3.7

## 2016-04-29 NOTE — Progress Notes (Signed)
I have reviewed and agree with the plan. 

## 2016-04-29 NOTE — Patient Instructions (Signed)
Pre visit review using our clinic review tool, if applicable. No additional management support is needed unless otherwise documented below in the visit note. 

## 2016-05-04 DIAGNOSIS — Z803 Family history of malignant neoplasm of breast: Secondary | ICD-10-CM | POA: Diagnosis not present

## 2016-05-04 DIAGNOSIS — Z1231 Encounter for screening mammogram for malignant neoplasm of breast: Secondary | ICD-10-CM | POA: Diagnosis not present

## 2016-05-04 LAB — HM MAMMOGRAPHY

## 2016-05-05 ENCOUNTER — Encounter: Payer: Medicare Other | Attending: Physical Medicine & Rehabilitation | Admitting: Physical Medicine & Rehabilitation

## 2016-05-05 ENCOUNTER — Encounter: Payer: Self-pay | Admitting: Family Medicine

## 2016-05-05 ENCOUNTER — Encounter: Payer: Self-pay | Admitting: Physical Medicine & Rehabilitation

## 2016-05-05 VITALS — BP 122/74 | HR 59 | Resp 14

## 2016-05-05 DIAGNOSIS — Z86010 Personal history of colonic polyps: Secondary | ICD-10-CM | POA: Insufficient documentation

## 2016-05-05 DIAGNOSIS — M961 Postlaminectomy syndrome, not elsewhere classified: Secondary | ICD-10-CM

## 2016-05-05 DIAGNOSIS — G8929 Other chronic pain: Secondary | ICD-10-CM | POA: Insufficient documentation

## 2016-05-05 DIAGNOSIS — Z79899 Other long term (current) drug therapy: Secondary | ICD-10-CM

## 2016-05-05 DIAGNOSIS — M792 Neuralgia and neuritis, unspecified: Secondary | ICD-10-CM

## 2016-05-05 DIAGNOSIS — Z8261 Family history of arthritis: Secondary | ICD-10-CM | POA: Diagnosis not present

## 2016-05-05 DIAGNOSIS — K219 Gastro-esophageal reflux disease without esophagitis: Secondary | ICD-10-CM | POA: Insufficient documentation

## 2016-05-05 DIAGNOSIS — Z801 Family history of malignant neoplasm of trachea, bronchus and lung: Secondary | ICD-10-CM | POA: Insufficient documentation

## 2016-05-05 DIAGNOSIS — I739 Peripheral vascular disease, unspecified: Secondary | ICD-10-CM | POA: Diagnosis not present

## 2016-05-05 DIAGNOSIS — Z86718 Personal history of other venous thrombosis and embolism: Secondary | ICD-10-CM | POA: Insufficient documentation

## 2016-05-05 DIAGNOSIS — Z9851 Tubal ligation status: Secondary | ICD-10-CM | POA: Diagnosis not present

## 2016-05-05 DIAGNOSIS — G894 Chronic pain syndrome: Secondary | ICD-10-CM | POA: Diagnosis not present

## 2016-05-05 DIAGNOSIS — Z5181 Encounter for therapeutic drug level monitoring: Secondary | ICD-10-CM

## 2016-05-05 DIAGNOSIS — G479 Sleep disorder, unspecified: Secondary | ICD-10-CM

## 2016-05-05 DIAGNOSIS — R269 Unspecified abnormalities of gait and mobility: Secondary | ICD-10-CM

## 2016-05-05 DIAGNOSIS — Z8249 Family history of ischemic heart disease and other diseases of the circulatory system: Secondary | ICD-10-CM | POA: Insufficient documentation

## 2016-05-05 DIAGNOSIS — M533 Sacrococcygeal disorders, not elsewhere classified: Secondary | ICD-10-CM | POA: Diagnosis not present

## 2016-05-05 DIAGNOSIS — E785 Hyperlipidemia, unspecified: Secondary | ICD-10-CM | POA: Insufficient documentation

## 2016-05-05 DIAGNOSIS — M1711 Unilateral primary osteoarthritis, right knee: Secondary | ICD-10-CM | POA: Diagnosis not present

## 2016-05-05 DIAGNOSIS — Z800 Family history of malignant neoplasm of digestive organs: Secondary | ICD-10-CM | POA: Insufficient documentation

## 2016-05-05 DIAGNOSIS — Z981 Arthrodesis status: Secondary | ICD-10-CM | POA: Insufficient documentation

## 2016-05-05 DIAGNOSIS — I10 Essential (primary) hypertension: Secondary | ICD-10-CM | POA: Insufficient documentation

## 2016-05-05 DIAGNOSIS — Z8371 Family history of colonic polyps: Secondary | ICD-10-CM | POA: Diagnosis not present

## 2016-05-05 DIAGNOSIS — G500 Trigeminal neuralgia: Secondary | ICD-10-CM | POA: Insufficient documentation

## 2016-05-05 DIAGNOSIS — Z955 Presence of coronary angioplasty implant and graft: Secondary | ICD-10-CM | POA: Diagnosis not present

## 2016-05-05 DIAGNOSIS — Z87891 Personal history of nicotine dependence: Secondary | ICD-10-CM | POA: Insufficient documentation

## 2016-05-05 DIAGNOSIS — M109 Gout, unspecified: Secondary | ICD-10-CM | POA: Insufficient documentation

## 2016-05-05 DIAGNOSIS — J449 Chronic obstructive pulmonary disease, unspecified: Secondary | ICD-10-CM | POA: Insufficient documentation

## 2016-05-05 DIAGNOSIS — Z803 Family history of malignant neoplasm of breast: Secondary | ICD-10-CM | POA: Insufficient documentation

## 2016-05-05 DIAGNOSIS — Z9049 Acquired absence of other specified parts of digestive tract: Secondary | ICD-10-CM | POA: Insufficient documentation

## 2016-05-05 MED ORDER — DULOXETINE HCL 30 MG PO CPEP: 30.0000 mg | ORAL_CAPSULE | Freq: Every day | ORAL | 1 refills | Status: DC

## 2016-05-05 MED ORDER — GABAPENTIN 100 MG PO CAPS: 100.0000 mg | ORAL_CAPSULE | Freq: Three times a day (TID) | ORAL | 1 refills | Status: DC

## 2016-05-05 MED ORDER — METHOCARBAMOL 500 MG PO TABS: 500.0000 mg | ORAL_TABLET | Freq: Two times a day (BID) | ORAL | 1 refills | Status: DC | PRN

## 2016-05-05 NOTE — Addendum Note (Signed)
Addended by: Marland Mcalpine B on: 05/05/2016 01:51 PM   Modules accepted: Orders

## 2016-05-05 NOTE — Progress Notes (Signed)
Subjective:    Patient ID: [name] [name], female    DOB: 08-10-1924, 83 y.o.   MRN: [id number]  HPI 83 y/o female with pmhpsh of PAD, COPD, DVT, HTN, Gout, trigeminal neuralgia, OA of right knee with injections, lumbar fusion present with pain in her back > right leg pain.  Pt states she has had back pain "all her life".  Getting progressively worse.  Movement improves the pain.  Standing and walking exacerbate the pain. Dull pain.  Non-radiating.  Intermittent.  Denies associated numbness, tingling, weakness.  Lidoderm patch help. Pain limits pt from doing things around the house. Denies falls.   Pain Inventory Average Pain 7 Pain Right Now 5 My pain is burning, dull and aching  In the last 24 hours, has pain interfered with the following? General activity 5 Relation with others 5 Enjoyment of life 7 What TIME of day is your pain at its worst? morning, night Sleep (in general) Fair  Pain is worse with: walking, bending and standing Pain improves with: rest Relief from Meds: n/a  Mobility walk without assistance how many minutes can you walk? 2 do you drive?  yes Do you have any goals in this area?  yes  Function not employed: date last employed n/a I need assistance with the following:  household duties  Neuro/Psych weakness trouble walking  Prior Studies Any changes since last visit?  no  Physicians involved in your care Any changes since last visit?  no   Family History  Problem Relation Age of Onset  . Arthritis Mother   . Hypertension Mother   . Heart disease Father   . Colon polyps Father   . Breast cancer Sister   . Lung cancer Sister   . Colon cancer Sister   . Cancer Sister     Breast  . Lung cancer Brother   . Cancer Brother     Lung  . Anesthesia problems Neg Hx   . Hypotension Neg Hx   . Malignant hyperthermia Neg Hx   . Pseudochol deficiency Neg Hx   . Cancer Daughter     Breast  . Hypertension Son    Social History   Social  History  . Marital status: Single    Spouse name: N/A  . Number of children: 4  . Years of education: 12   Occupational History  .  Retired   Social History Main Topics  . Smoking status: Former Smoker    Packs/day: 2.00    Years: 30.00    Types: Cigarettes    Quit date: 06/18/1993  . Smokeless tobacco: Never Used     Comment: QUIT IN 1995  . Alcohol use No  . Drug use: No  . Sexual activity: Not Asked   Other Topics Concern  . None   Social History Narrative   Patient is widowed with 4 children. 2 grandkids. Lives alone.    Patient is right handed.   Patient has high school education.   Patient drinks 1 cup daily.      Retired from [employer] for 28 years, works 2 days a week until 2016.       Hobbies: volunteers at [employer], [place] from church visits people.          Past Surgical History:  Procedure Laterality Date  . ABDOMINAL AORTAGRAM N/A 06/17/2011   Procedure: ABDOMINAL Maxcine Ham;  Surgeon: Elam Dutch, MD;  Location: [facility] CATH LAB;  Service: Cardiovascular;  Laterality: N/A;  .  CHOLECYSTECTOMY  1998  . EYE SURGERY     cataracts removed- bilateral /w IOL  . FEMORAL-POPLITEAL BYPASS GRAFT        x2 surgeries 1990's & 2009  . INJECTION KNEE Right Aug. 2016   Gel injection for pain  . LUMBAR FUSION  07/06/2011  . TONSILLECTOMY     as a teenager   . TUBAL LIGATION     Past Medical History:  Diagnosis Date  . Allergic rhinoconjunctivitis   . Anxiety    pt. managed- uses deep breathing   . Arthritis    low back , stenosis  . COLONIC POLYPS, RECURRENT 08/29/2006   2008 last colonoscopy. No further colonoscopy.     Marland Kitchen COPD (chronic obstructive pulmonary disease) ([place])   . Diverticulosis   . DVT (deep venous thrombosis) ([place])   . Eczema   . Environmental allergies    allergy shot- q friday in Dr. Janee Morn office. PFT's abnormal- recommended Spiriva to use preop & will d/c after surgery  . GERD (gastroesophageal reflux disease)   . H/O  hiatal hernia   . History of colonic polyps   . Hyperlipidemia   . Hypertension   . Lung nodule 2011  . Peripheral vascular disease ([place])   . Shingles   . Stenosis of popliteal artery ([facility])    blood clots in legs long ago    . Trigeminal neuralgia    BP 122/74   Pulse (!) 59   Resp 14   SpO2 91%   Opioid Risk Score:   Fall Risk Score:  `1  Depression screen PHQ 2/9  Depression screen [facility] 2/9 05/05/2016 09/14/2015 08/31/2015 08/11/2015 03/07/2015 08/07/2014 07/01/2013  Decreased Interest 0 0 0 0 0 0 0  Down, Depressed, Hopeless 0 0 0 0 0 0 1  PHQ - 2 Score 0 0 0 0 0 0 1  Altered sleeping 1 - - - - - -  Tired, decreased energy 0 - - - - - -  Change in appetite 1 - - - - - -  Feeling bad or failure about yourself  0 - - - - - -  Trouble concentrating 0 - - - - - -  Moving slowly or fidgety/restless 0 - - - - - -  Suicidal thoughts 0 - - - - - -  PHQ-9 Score 2 - - - - - -  Some recent data might be hidden   Review of Systems  Constitutional: Negative.   HENT: Negative.   Eyes: Negative.   Respiratory: Negative.   Cardiovascular: Negative.   Gastrointestinal: Negative.   Endocrine:       High blood pressure   Genitourinary: Positive for dysuria.  Musculoskeletal: Positive for arthralgias, back pain and myalgias.  Skin: Negative.   Allergic/Immunologic: Negative.   Neurological: Negative.   Hematological: Negative.   Psychiatric/Behavioral: Negative.   All other systems reviewed and are negative.     Objective:   Physical Exam Gen: NAD. Vital signs reviewed HENT: Normocephalic, Atraumatic Eyes: EOMI. No discharge. Cardio: RRR. No JVD. Pulm: B/l clear to auscultation.  Effort normal Abd: Soft, BS+ MSK:  Gait WNL.   TTP along lumbar PSPs, R>L, with ?>right SI joint.    Edema along surgical sites (thrombectomy) medial LE   Neg FABERs.  Gait: slow cadence Neuro: CN II-XII grossly intact.    Sensation intact to light touch in all UE and LE dermatomes  Reflexes 1+  throughout  Strength  4/5 proximally, 5/5 distally (pain  inhibition, left stronger than right)  Sensation intact to light touch  Neg SLR b/l Skin: Warm and Dry    Assessment & Plan:  83 y/o female with pmhpsh of PAD, COPD, DVT, HTN, Gout, trigeminal neuralgia, OA of right knee with injections, lumbar fusion present with pain in her back > right leg pain.    1. Chronic low back pain with failed back syndrome Referral information reviewed  Labs reviewed  Will avoid NSAIDs due to coumadin  Encouraged trial of heat/cold  Cont tylenol, limit to 2000mg /day  Cont patches  MRI reviewed of knee showing bakers cyst and meniscal tearing  Will order PT with consideration of ROM, stretching, HEP, and TENS given that movement improves the pain  Xray of L-spine and right SI joint ordered  Will trial Gabapentin 100 qhs  Will order Cymbalta 30 mg and consider increase to 60 on next visit  Will order Robaxin 500 BID PRN  NCCSRS reviewed  2. Possible Sacroiliitis on right  Xray ordered  Will consider bracing  3. Sleep disturbance  Due to pain  Cont melatonin  4. Neuropathic pain  See #1  5. Gait abnormality  Does not want a cane at present  6. Myalgia  Will consider trigger point injections in future

## 2016-05-06 ENCOUNTER — Ambulatory Visit (HOSPITAL_COMMUNITY)
Admission: RE | Admit: 2016-05-06 | Discharge: 2016-05-06 | Disposition: A | Discharge status: Home / Self Care | Payer: Medicare Other | Source: Ambulatory Visit | Attending: Physical Medicine & Rehabilitation | Admitting: Physical Medicine & Rehabilitation

## 2016-05-06 DIAGNOSIS — M533 Sacrococcygeal disorders, not elsewhere classified: Secondary | ICD-10-CM | POA: Insufficient documentation

## 2016-05-06 DIAGNOSIS — M545 Low back pain: Secondary | ICD-10-CM | POA: Diagnosis not present

## 2016-05-06 DIAGNOSIS — M5136 Other intervertebral disc degeneration, lumbar region: Secondary | ICD-10-CM | POA: Insufficient documentation

## 2016-05-06 DIAGNOSIS — G894 Chronic pain syndrome: Secondary | ICD-10-CM | POA: Diagnosis not present

## 2016-05-06 DIAGNOSIS — M961 Postlaminectomy syndrome, not elsewhere classified: Secondary | ICD-10-CM | POA: Diagnosis not present

## 2016-05-12 LAB — TOXASSURE SELECT,+ANTIDEPR,UR

## 2016-05-16 ENCOUNTER — Encounter: Payer: Self-pay | Admitting: Physical Therapy

## 2016-05-16 ENCOUNTER — Ambulatory Visit: Payer: Medicare Other | Attending: Physical Medicine & Rehabilitation | Admitting: Physical Therapy

## 2016-05-16 DIAGNOSIS — R2689 Other abnormalities of gait and mobility: Secondary | ICD-10-CM | POA: Diagnosis not present

## 2016-05-16 DIAGNOSIS — M6281 Muscle weakness (generalized): Secondary | ICD-10-CM | POA: Insufficient documentation

## 2016-05-16 NOTE — Patient Instructions (Signed)
Relaxation Exercises with the Urge to Void   When you experience an urge to void:  FIRST  Stop and stand very still    Sit down if you can    Don't move    You need to stay very still to maintain control  SECOND Squeeze your pelvic floor muscles 5 times, like a quick flick, to keep from leaking  THIRD Relax  Take a deep breath and then let it out  Try to make the urge go away by using relaxation and visualization techniques  FINALLY When you feel the urge go away somewhat, walk normally to the bathroom.   If the urge gets suddenly stronger on the way, you may stop again and relax to regain control.  

## 2016-05-16 NOTE — Therapy (Signed)
[facility] [address], [place], [name], [address] Phone: [phone]   Fax:  [phone]  Physical Therapy Evaluation  Patient Details  Name: RIPLEY REIGLE MRN: [id number] Date of Birth: 23-Dec-1924 Referring Provider: Jamse Arn, MD  Encounter Date: 05/16/2016      PT End of Session - 05/16/16 1101    Visit Number 1   Number of Visits 10   Date for PT Re-Evaluation July 24, 2016   Authorization Type g codes at 10th visit   PT Start Time 1016   PT Stop Time 1100   PT Time Calculation (min) 44 min   Activity Tolerance Patient tolerated treatment well   Behavior During Therapy [facility] for tasks assessed/performed      Past Medical History:  Diagnosis Date  . Allergic rhinoconjunctivitis   . Anxiety    pt. managed- uses deep breathing   . Arthritis    low back , stenosis  . COLONIC POLYPS, RECURRENT 08/29/2006   2008 last colonoscopy. No further colonoscopy.     Marland Kitchen COPD (chronic obstructive pulmonary disease) ([place])   . Diverticulosis   . DVT (deep venous thrombosis) ([place])   . Eczema   . Environmental allergies    allergy shot- q friday in Dr. Janee Morn office. PFT's abnormal- recommended Spiriva to use preop & will d/c after surgery  . GERD (gastroesophageal reflux disease)   . H/O hiatal hernia   . History of colonic polyps   . Hyperlipidemia   . Hypertension   . Lung nodule 2011  . Peripheral vascular disease ([place])   . Shingles   . Stenosis of popliteal artery (HCC)    blood clots in legs long ago    . Trigeminal neuralgia     Past Surgical History:  Procedure Laterality Date  . ABDOMINAL AORTAGRAM N/A 06/17/2011   Procedure: ABDOMINAL Maxcine Ham;  Surgeon: Elam Dutch, MD;  Location: [facility] C[facility] LAB;  Service: Cardiovascular;  Laterality: N/A;  . CHOLECYSTECTOMY  1998  . EYE SURGERY     cataracts removed- bilateral /w IOL  . FEMORAL-POPLITEAL BYPASS GRAFT        x2 surgeries 1990's & 2009  .  INJECTION KNEE Right Aug. 2016   Gel injection for pain  . LUMBAR FUSION  07/06/2011  . TONSILLECTOMY     as a teenager   . TUBAL LIGATION      There were no vitals filed for this visit.       Subjective Assessment - 05/16/16 1021    Subjective Pt states she has been a little more wobbly lately, States she is having very bad pain when she is up and walking or standing.  Pain has been going on for years, but getting worse   Pertinent History severe arthritis Rt knee, history of abdominal surgery, history of DVT   Limitations Standing;Walking   How long can you stand comfortably? cannot stand and talk to people   How long can you walk comfortably? 150 ft   Patient Stated Goals be able to walk further and stand longer   Currently in Pain? Yes   Pain Score 8    Pain Location Back   Pain Orientation Lower;Right;Left   Pain Descriptors / Indicators Constant;Aching   Pain Type Chronic pain   Pain Onset More than a month ago   Aggravating Factors  standing, walking, carrying groceries   Pain Relieving Factors can walk more when leaning on cart   Effect of Pain on  Daily Activities carrying groceries, cooking, showering            [facility] PT Assessment - 05/16/16 0001      Assessment   Medical Diagnosis G89.4 Chronic pain, M96.1 failed back syndrome, M53.3 sacroiliac pain, R26.9 abnormaility of gait   Referring Provider Jamse Arn, MD   Onset Date/Surgical Date --  "years ago"   Next MD Visit 06/22/16   Prior Therapy no     Precautions   Precautions None     Restrictions   Weight Bearing Restrictions No     Balance Screen   Has the patient fallen in the past 6 months No   Has the patient had a decrease in activity level because of a fear of falling?  No   Is the patient reluctant to leave their home because of a fear of falling?  No     Home Environment   Living Environment Private residence   Living Arrangements Alone   Type of [place]     Prior Function    Level of [place]  some help with cleaning   Leisure cooking     Cognition   Overall Cognitive Status Within Functional Limits for tasks assessed     Observation/Other Assessments   Focus on Therapeutic Outcomes (FOTO)  60% limited  goal 47%     Posture/Postural Control   Posture/Postural Control No significant limitations   Posture Comments Rt weight shift in sitting, flexed trunk in standing     ROM / Strength   AROM / PROM / Strength AROM;PROM;Strength     AROM   Overall AROM  Deficits   Right/Left Hip Left;Right  50% limitation   Lumbar Flexion 25% limitation     Strength   Overall Strength Deficits   Overall Strength Comments Rt LE grossly 4-/5; Lt grossly 4/5     Palpation   Palpation comment very tender to palpation bilateral glute attachments     Ambulation/Gait   Gait Pattern Trunk flexed;Decreased step length - left;Decreased stance time - right     Standardized Balance Assessment   Standardized Balance Assessment Timed Up and Go Test;Five Times Sit to Stand   Five times sit to stand comments  23 seconds     Timed Up and Go Test   TUG Normal TUG   Normal TUG (seconds) 17  uses UE support to stand                 Pelvic Floor Special Questions - 05/16/16 0001    Prior Pregnancies Yes   Number of Pregnancies 4   Urinary Leakage Yes   How often daily   Pad use daily   Activities that cause leaking With strong urge;Coughing;Sneezing   Urinary urgency Yes                  PT Education - 05/16/16 1100    Education provided Yes   Education Details kegel with ball squeeze and instructions on controlling urge to void   Person(s) Educated Patient   Methods Explanation;Verbal cues;Handout   Comprehension Verbalized understanding          PT Short Term Goals - 05/16/16 1137      PT SHORT TERM GOAL #1   Title independent with initial HEP   Time 4   Period Weeks   Status New     PT SHORT TERM GOAL #2   Title  improved upright posture in standing due to increased hip  extension and increased glute strength   Time 4   Period Weeks   Status New     PT SHORT TERM GOAL #3   Title able to stand for 20 minutes due to increased strength and endurance   Time 4   Period Weeks   Status New           PT Long Term Goals - 05/16/16 1139      PT LONG TERM GOAL #1   Title independent with advanced HEP   Time 8   Period Weeks   Status New     PT LONG TERM GOAL #2   Title FOTO < or = to 47%   Time 8   Period Weeks   Status New     PT LONG TERM GOAL #3   Title Pt will be able to stand for at least 30 minutes in order to prepare meal   Time 8   Period Weeks   Status New     PT LONG TERM GOAL #4   Title Pt will be able to do grocery shopping without feeling like her legs wanting to give out   Time 8   Period Weeks   Status New     PT LONG TERM GOAL #5   Title Improve TUG to <14sec to demonstrate decreased risk of falls   Time 8   Period Weeks   Status New               Plan - 05/16/16 1117    Clinical Impression Statement Patient was seen for moderate complexity evaluation due multiple systems involved such as pelvic floor weakness, low back pain, hip and core weakness.  Pt has worsening condition, and patient has comorbidities that will effect treatment such as severe arthritis Rt knee, history of abdominal surgery, history of DVT.  Patient has pain up to 8 or 9/10 when performing daily functional activities such as cooking, cleaning and grocery shopping.  Pt has had pain but it has been getting worse lately.  Pt demonstrates limited AROM of 50% for hip extension and 50% limitation for lumbar.  Pt has highly sensative trigger point along ischial and sacral muslce attachments.  Pt demonstrates increased risk of falls based on 5 x sit to stand of 23s and TUG of 17sec.  Pt demonstrates core and pelvic floor muscle weakness based on screening questions about urinary leakage and urgency.   Pt has posture and gait deficits inclueding increased trunk flexion.  Pt sits on right side and unable to maintain upright posture.  Pt will benefit from skilled PT to address these impairments and return to maximum function with reduced risk of falls.   Rehab Potential Excellent   Clinical Impairments Affecting Rehab Potential severe arthritis Rt knee, history of abdominal surgery, history of DVT   PT Frequency 2x / week   PT Duration 8 weeks   PT Treatment/Interventions Cryotherapy;Electrical Stimulation;Moist Heat;Therapeutic activities;Therapeutic exercise;Balance training;Neuromuscular re-education;Patient/family education;Manual techniques;Biofeedback;Passive range of motion;Taping;Dry needling   PT Next Visit Plan f/u with controlling urge to void instructions, core and hip strengthening, hip flexor stretches, balance, manual techniques to release gluteal tissue   PT Home Exercise Plan progress as tolerated   Consulted and Agree with Plan of Care Patient      Patient will benefit from skilled therapeutic intervention in order to improve the following deficits and impairments:  Abnormal gait, Decreased activity tolerance, Decreased balance, Decreased endurance, Decreased range of motion, Difficulty walking, Decreased strength, Increased  muscle spasms, Pain  Visit Diagnosis: Other abnormalities of gait and mobility - Plan: PT plan of care cert/re-cert  Muscle weakness (generalized) - Plan: PT plan of care cert/re-cert      G-Codes - [id number]    Functional Assessment Tool Used FOTO, TUG, 5 x sit to stand, clnical judgement   Functional Limitation Mobility: Walking and moving around   Mobility: Walking and Moving Around Current Status [phone]) At least 60 percent but less than 80 percent impaired, limited or restricted   Mobility: Walking and Moving Around Goal Status [phone]) At least 40 percent but less than 60 percent impaired, limited or restricted       Problem List Patient  Active Problem List   Diagnosis Date Noted  . Unilateral primary osteoarthritis, right knee 04/07/2016  . Acute gout of left foot 03/22/2016  . Pain in joint, lower leg 08/28/2014  . Trigeminal neuralgia   . Encounter for therapeutic drug monitoring 10/21/2013  . Syncope 12/29/2011  . Chronic low back pain 09/13/2010  . Allergic rhinitis due to pollen 06/25/2010  . COPD mixed type ([place]) 01/22/2010  . Solitary pulmonary nodule 01/12/2010  . Hyperglycemia 01/06/2009  . Osteopenia 01/06/2009  . ECZEMA 11/26/2007  . Peripheral arterial disease ([place]) 06/29/2007  . Hyperlipidemia 10/23/2006  . Essential hypertension 10/23/2006  . ESOPHAGEAL STRICTURE 08/09/1993    Zannie Cove, PT 05/16/2016, 12:01 PM  [facility] [address] [place], [name], [address] Phone: [phone]   Fax:  [phone]  Name: GAILE TYGART MRN: [id number] Date of Birth: 12/20/1924

## 2016-05-17 NOTE — Progress Notes (Signed)
Urine drug screen for this encounter is consistent for having no medication  

## 2016-05-18 ENCOUNTER — Ambulatory Visit: Payer: Medicare Other | Admitting: Physical Therapy

## 2016-05-18 ENCOUNTER — Encounter: Payer: Self-pay | Admitting: Physical Therapy

## 2016-05-18 DIAGNOSIS — R2689 Other abnormalities of gait and mobility: Secondary | ICD-10-CM

## 2016-05-18 DIAGNOSIS — M6281 Muscle weakness (generalized): Secondary | ICD-10-CM | POA: Diagnosis not present

## 2016-05-18 NOTE — Patient Instructions (Signed)
Trunk: Rotation    Lie on back with a few pillows to prop yourself up.Keep head and shoulders flat and relaxed. Drop the thighs bones side to side slowly about 20 times. Then, hold the legs to one side and hold for 20-30 seconds. Don't forget to breathe and relax. Repeat _3___ times, alternating sides. Do __1-2__ sessions per day. CAUTION: Movement should be gentle, steady and slow.  Copyright  VHI. All rights reserved.   Low Back Stretch: One leg (Supine)    Lying on back, bring one knee toward chest by pulling gently behind knee. Make sure to bend the other knee, this will be easier on your back. Pillows ok!! Hold __20-30__ seconds. Repeat with other leg.  Copyright  VHI. All rights reserved.   #1: Your first exercise is to lay down with your pillows and breathe/relax for about 1 min or so. Longer would be ok. Goal is to make your bones heavy and muscles relaxed.

## 2016-05-18 NOTE — Therapy (Signed)
[facility] [address], [place], [name], [address] Phone: [phone]   Fax:  [phone]  Physical Therapy Treatment  Patient Details  Name: [name] [name] MRN: [id number] Date of Birth: [birth year] Referring Provider: Jamse Arn, MD  Encounter Date: 05/18/2016      PT End of Session - 05/18/16 0942    Visit Number 2   Number of Visits 10   Date for PT Re-Evaluation 08-01-2016   Authorization Type g codes at 10th visit   PT Start Time 0932   PT Stop Time 1015   PT Time Calculation (min) 43 min   Activity Tolerance Patient tolerated treatment well   Behavior During Therapy [facility] for tasks assessed/performed      Past Medical History:  Diagnosis Date  . Allergic rhinoconjunctivitis   . Anxiety    pt. managed- uses deep breathing   . Arthritis    low back , stenosis  . COLONIC POLYPS, RECURRENT 08/29/2006   2008 last colonoscopy. No further colonoscopy.     Marland Kitchen COPD (chronic obstructive pulmonary disease) ([place])   . Diverticulosis   . DVT (deep venous thrombosis) ([place])   . Eczema   . Environmental allergies    allergy shot- q friday in Dr. Janee Morn office. PFT's abnormal- recommended Spiriva to use preop & will d/c after surgery  . GERD (gastroesophageal reflux disease)   . H/O hiatal hernia   . History of colonic polyps   . Hyperlipidemia   . Hypertension   . Lung nodule 2011  . Peripheral vascular disease ([place])   . Shingles   . Stenosis of popliteal artery (HCC)    blood clots in legs long ago    . Trigeminal neuralgia     Past Surgical History:  Procedure Laterality Date  . ABDOMINAL AORTAGRAM N/A 06/17/2011   Procedure: ABDOMINAL Maxcine Ham;  Surgeon: Elam Dutch, MD;  Location: [facility] CATH LAB;  Service: Cardiovascular;  Laterality: N/A;  . CHOLECYSTECTOMY  1998  . EYE SURGERY     cataracts removed- bilateral /w IOL  . FEMORAL-POPLITEAL BYPASS GRAFT        x2 surgeries 1990's & 2009  .  INJECTION KNEE Right Aug. 2016   Gel injection for pain  . LUMBAR FUSION  07/06/2011  . TONSILLECTOMY     as a teenager   . TUBAL LIGATION      There were no vitals filed for this visit.      Subjective Assessment - 05/18/16 0939    Subjective The exercises are really helping already.   Currently in Pain? Yes   Pain Location Back   Pain Orientation Lower   Pain Descriptors / Indicators Aching   Multiple Pain Sites No                         [facility] Adult PT Treatment/Exercise - 05/18/16 0001      Lumbar Exercises: Aerobic   Stationary Bike Nustep L1 x10 min  Concurrent review of status                PT Education - 05/18/16 0952    Education provided Yes   Education Details Semireclined breathing to facilitate relaxing, Single knee to chest, Lower trunk rotation stretches for HEP   Person(s) Educated Patient   Methods Explanation;Demonstration;Verbal cues   Comprehension Verbalized understanding;Returned demonstration          PT Short Term Goals - 05/18/16 [id number]  PT SHORT TERM GOAL #1   Title independent with initial HEP   Time 4   Period Weeks           PT Long Term Goals - 05/16/16 1139      PT LONG TERM GOAL #1   Title independent with advanced HEP   Time 8   Period Weeks   Status New     PT LONG TERM GOAL #2   Title FOTO < or = to 47%   Time 8   Period Weeks   Status New     PT LONG TERM GOAL #3   Title Pt will be able to stand for at least 30 minutes in order to prepare meal   Time 8   Period Weeks   Status New     PT LONG TERM GOAL #4   Title Pt will be able to do grocery shopping without feeling like her legs wanting to give out   Time 8   Period Weeks   Status New     PT LONG TERM GOAL #5   Title Improve TUG to <14sec to demonstrate decreased risk of falls   Time 8   Period Weeks   Status New               Plan - 05/18/16 [id number]    Clinical Impression Statement Today was pt's first treatment  session. Pt is independent in initial HEP, meeting STG #1. We added hip and lumbar stretches to HEP which pt performed during the session today. Pt could contract the pelvic floor today without activating the gluteals.    Rehab Potential Excellent   Clinical Impairments Affecting Rehab Potential severe arthritis Rt knee, history of abdominal surgery, history of DVT   PT Frequency 2x / week   PT Duration 8 weeks   PT Next Visit Plan Review home stretches given today. Core strength next and standing hip kicks.    Consulted and Agree with Plan of Care --      Patient will benefit from skilled therapeutic intervention in order to improve the following deficits and impairments:  Abnormal gait, Decreased activity tolerance, Decreased balance, Decreased endurance, Decreased range of motion, Difficulty walking, Decreased strength, Increased muscle spasms, Pain  Visit Diagnosis: Other abnormalities of gait and mobility  Muscle weakness (generalized)     Problem List Patient Active Problem List   Diagnosis Date Noted  . Unilateral primary osteoarthritis, right knee 04/07/2016  . Acute gout of left foot 03/22/2016  . Pain in joint, lower leg 08/28/2014  . Trigeminal neuralgia   . Encounter for therapeutic drug monitoring 10/21/2013  . Syncope 12/29/2011  . Chronic low back pain 09/13/2010  . Allergic rhinitis due to pollen 06/25/2010  . COPD mixed type ([place]) 01/22/2010  . Solitary pulmonary nodule 01/12/2010  . Hyperglycemia 01/06/2009  . Osteopenia 01/06/2009  . ECZEMA 11/26/2007  . Peripheral arterial disease ([place]) 06/29/2007  . Hyperlipidemia 10/23/2006  . Essential hypertension 10/23/2006  . ESOPHAGEAL STRICTURE 08/09/1993    Winter Jocelyn, PTA 05/18/2016, 10:13 AM  [facility] [address] [place], [name], [address] Phone: [phone]   Fax:  [phone]  Name: [name] [name] MRN: [id number] Date of  Birth: [birth year]

## 2016-05-23 ENCOUNTER — Encounter: Payer: Self-pay | Admitting: Physical Therapy

## 2016-05-23 ENCOUNTER — Ambulatory Visit: Payer: Medicare Other | Admitting: Physical Therapy

## 2016-05-23 DIAGNOSIS — R2689 Other abnormalities of gait and mobility: Secondary | ICD-10-CM

## 2016-05-23 DIAGNOSIS — M6281 Muscle weakness (generalized): Secondary | ICD-10-CM | POA: Diagnosis not present

## 2016-05-23 NOTE — Therapy (Signed)
[facility] [address], [place], [name], [address] Phone: [phone]   Fax:  [phone]  Physical Therapy Treatment  Patient Details  Name: [name] [name] MRN: [id number] Date of Birth: 09/29/1924 Referring Provider: Jamse Arn, MD  Encounter Date: 05/23/2016      PT End of Session - 05/23/16 1024    Visit Number 3   Number of Visits 10   Date for PT Re-Evaluation 08/04/16   Authorization Type g codes at 10th visit   PT Start Time 1017   PT Stop Time 1116   PT Time Calculation (min) 59 min   Activity Tolerance Patient limited by pain   Behavior During Therapy [facility] for tasks assessed/performed      Past Medical History:  Diagnosis Date  . Allergic rhinoconjunctivitis   . Anxiety    pt. managed- uses deep breathing   . Arthritis    low back , stenosis  . COLONIC POLYPS, RECURRENT 08/29/2006   2008 last colonoscopy. No further colonoscopy.     Marland Kitchen COPD (chronic obstructive pulmonary disease) ([place])   . Diverticulosis   . DVT (deep venous thrombosis) ([place])   . Eczema   . Environmental allergies    allergy shot- q friday in Dr. Janee Morn office. PFT's abnormal- recommended Spiriva to use preop & will d/c after surgery  . GERD (gastroesophageal reflux disease)   . H/O hiatal hernia   . History of colonic polyps   . Hyperlipidemia   . Hypertension   . Lung nodule 2011  . Peripheral vascular disease ([place])   . Shingles   . Stenosis of popliteal artery (HCC)    blood clots in legs long ago    . Trigeminal neuralgia     Past Surgical History:  Procedure Laterality Date  . ABDOMINAL AORTAGRAM N/A 06/17/2011   Procedure: ABDOMINAL Maxcine Ham;  Surgeon: Elam Dutch, MD;  Location: [facility] CATH LAB;  Service: Cardiovascular;  Laterality: N/A;  . CHOLECYSTECTOMY  1998  . EYE SURGERY     cataracts removed- bilateral /w IOL  . FEMORAL-POPLITEAL BYPASS GRAFT        x2 surgeries 1990's & 2009  . INJECTION  KNEE Right Aug. 2016   Gel injection for pain  . LUMBAR FUSION  07/06/2011  . TONSILLECTOMY     as a teenager   . TUBAL LIGATION      There were no vitals filed for this visit.      Subjective Assessment - 05/23/16 1025    Subjective The rain is really getting me the last few days. More pain on rainly days, exercises are all good and helpful. Pt ambulating slow and guarded today due to pain.     Currently in Pain? Yes   Pain Score 8    Pain Location Back   Pain Orientation Lower   Pain Descriptors / Indicators Crushing   Aggravating Factors  Standing   Pain Relieving Factors sitting is better than standing but not too long   Multiple Pain Sites No                         [facility] Adult PT Treatment/Exercise - 05/23/16 0001      Lumbar Exercises: Aerobic   Stationary Bike Nustep L1 x 10 min  PTA present for goals and status update     Moist Heat Therapy   Number Minutes Moist Heat 15 Minutes   Moist Heat Location --  Rt lumbar to hip     Electrical Stimulation   Electrical Stimulation Location RT lumbar to hip and pelvis   Electrical Stimulation Action IFC   Electrical Stimulation Parameters 80-150 HZ   Electrical Stimulation Goals Pain     Manual Therapy   Manual Therapy Soft tissue mobilization   Soft tissue mobilization Lumbar in sidelying   Pt needs to be semi reclined                  PT Short Term Goals - 05/18/16 0943      PT SHORT TERM GOAL #1   Title independent with initial HEP   Time 4   Period Weeks           PT Long Term Goals - 05/16/16 1139      PT LONG TERM GOAL #1   Title independent with advanced HEP   Time 8   Period Weeks   Status New     PT LONG TERM GOAL #2   Title FOTO < or = to 47%   Time 8   Period Weeks   Status New     PT LONG TERM GOAL #3   Title Pt will be able to stand for at least 30 minutes in order to prepare meal   Time 8   Period Weeks   Status New     PT LONG TERM GOAL #4   Title  Pt will be able to do grocery shopping without feeling like her legs wanting to give out   Time 8   Period Weeks   Status New     PT LONG TERM GOAL #5   Title Improve TUG to <14sec to demonstrate decreased risk of falls   Time 8   Period Weeks   Status New               Plan - 05/23/16 1024    Clinical Impression Statement Pt continues to be compliant in her HEP with reports that they are helpful. The rain over the last 2 days has increased her pain, but this is an established pattern with this kind of weather.  We kept the treatment focused on helping reduce her pain today or at least to try and get back to her baseline levels. Pt's RT QL had a huge trigger point that was very tender along with the adjacent tissues to the proximal pelvis. Pt was significantly better after session.     Rehab Potential Excellent   Clinical Impairments Affecting Rehab Potential severe arthritis Rt knee, history of abdominal surgery, history of DVT   PT Frequency 2x / week   PT Duration 8 weeks   PT Treatment/Interventions Cryotherapy;Electrical Stimulation;Moist Heat;Therapeutic activities;Therapeutic exercise;Balance training;Neuromuscular re-education;Patient/family education;Manual techniques;Biofeedback;Passive range of motion;Taping;Dry needling   PT Next Visit Plan Review home stretches given today. Core strength next and standing hip kicks. Manual to RT lumbosacral area.    Consulted and Agree with Plan of Care Patient      Patient will benefit from skilled therapeutic intervention in order to improve the following deficits and impairments:  Abnormal gait, Decreased activity tolerance, Decreased balance, Decreased endurance, Decreased range of motion, Difficulty walking, Decreased strength, Increased muscle spasms, Pain  Visit Diagnosis: Other abnormalities of gait and mobility  Muscle weakness (generalized)     Problem List Patient Active Problem List   Diagnosis Date Noted  .  Unilateral primary osteoarthritis, right knee 04/07/2016  . Acute gout of left foot 03/22/2016  .  Pain in joint, lower leg 08/28/2014  . Trigeminal neuralgia   . Encounter for therapeutic drug monitoring 10/21/2013  . Syncope 12/29/2011  . Chronic low back pain 09/13/2010  . Allergic rhinitis due to pollen 06/25/2010  . COPD mixed type ([place]) 01/22/2010  . Solitary pulmonary nodule 01/12/2010  . Hyperglycemia 01/06/2009  . Osteopenia 01/06/2009  . ECZEMA 11/26/2007  . Peripheral arterial disease ([place]) 06/29/2007  . Hyperlipidemia 10/23/2006  . Essential hypertension 10/23/2006  . ESOPHAGEAL STRICTURE 08/09/1993    Shawnda Mauney, PTA 05/23/2016, 3:53 PM  [facility] [address] [place], [name], [address] Phone: [phone]   Fax:  [phone]  Name: [name] [name] MRN: [id number] Date of Birth: 03/07/1925

## 2016-05-25 ENCOUNTER — Ambulatory Visit: Payer: Medicare Other | Admitting: Physical Therapy

## 2016-05-25 ENCOUNTER — Encounter: Payer: Self-pay | Admitting: Physical Therapy

## 2016-05-25 DIAGNOSIS — M6281 Muscle weakness (generalized): Secondary | ICD-10-CM

## 2016-05-25 DIAGNOSIS — R2689 Other abnormalities of gait and mobility: Secondary | ICD-10-CM

## 2016-05-25 NOTE — Therapy (Signed)
[facility] [address] [place], [name], [address] Phone: [phone]   Fax:  [phone]  Physical Therapy Treatment  Patient Details  Name: [name] [name] MRN: [id number] Date of Birth: 01/17/1925 Referring Provider: Jamse Arn, MD  Encounter Date: 05/25/2016      PT End of Session - 05/25/16 0928    Visit Number 4   Number of Visits 10   Date for PT Re-Evaluation 2016-08-09   Authorization Type g codes at 10th visit   PT Start Time 0925   PT Stop Time 1020   PT Time Calculation (min) 55 min   Activity Tolerance Patient tolerated treatment well   Behavior During Therapy [facility] for tasks assessed/performed      Past Medical History:  Diagnosis Date  . Allergic rhinoconjunctivitis   . Anxiety    pt. managed- uses deep breathing   . Arthritis    low back , stenosis  . COLONIC POLYPS, RECURRENT 08/29/2006   2008 last colonoscopy. No further colonoscopy.     Marland Kitchen COPD (chronic obstructive pulmonary disease) ([place])   . Diverticulosis   . DVT (deep venous thrombosis) ([place])   . Eczema   . Environmental allergies    allergy shot- q friday in Dr. Janee Morn office. PFT's abnormal- recommended Spiriva to use preop & will d/c after surgery  . GERD (gastroesophageal reflux disease)   . H/O hiatal hernia   . History of colonic polyps   . Hyperlipidemia   . Hypertension   . Lung nodule 2011  . Peripheral vascular disease ([place])   . Shingles   . Stenosis of popliteal artery (HCC)    blood clots in legs long ago    . Trigeminal neuralgia     Past Surgical History:  Procedure Laterality Date  . ABDOMINAL AORTAGRAM N/A 06/17/2011   Procedure: ABDOMINAL Maxcine Ham;  Surgeon: Elam Dutch, MD;  Location: [facility] CATH LAB;  Service: Cardiovascular;  Laterality: N/A;  . CHOLECYSTECTOMY  1998  . EYE SURGERY     cataracts removed- bilateral /w IOL  . FEMORAL-POPLITEAL BYPASS GRAFT        x2 surgeries 1990's & 2009  .  INJECTION KNEE Right Aug. 2016   Gel injection for pain  . LUMBAR FUSION  07/06/2011  . TONSILLECTOMY     as a teenager   . TUBAL LIGATION      There were no vitals filed for this visit.      Subjective Assessment - 05/25/16 0929    Subjective I felt so much better after last session. i can even pick some things up now that I could not do previously.    Currently in Pain? Yes   Pain Score 1    Pain Location Back   Pain Orientation Lower   Pain Descriptors / Indicators Aching   Multiple Pain Sites No                         [facility] Adult PT Treatment/Exercise - 05/25/16 0001      Lumbar Exercises: Stretches   Standing Side Bend 3 reps;10 seconds     Lumbar Exercises: Aerobic   Stationary Bike Nustep L1 x 10 min  PTA present for goals and status update     Moist Heat Therapy   Number Minutes Moist Heat 15 Minutes   Moist Heat Location --  Rt lumbar to hip     Acupuncturist  Stimulation Location RT lumbar to hip and pelvis   Electrical Stimulation Action IFC   Electrical Stimulation Parameters 80-150 HZ   Electrical Stimulation Goals Pain     Manual Therapy   Manual Therapy Soft tissue mobilization   Soft tissue mobilization Lumbar in sidelying   Pt needs to be semi reclined                PT Education - 05/25/16 0932    Education Details Sidebend stretching for HEP   Person(s) Educated Patient   Methods Explanation;Demonstration;Tactile cues;Verbal cues;Handout   Comprehension Verbalized understanding;Returned demonstration          PT Short Term Goals - 05/18/16 0943      PT SHORT TERM GOAL #1   Title independent with initial HEP   Time 4   Period Weeks           PT Long Term Goals - 05/16/16 1139      PT LONG TERM GOAL #1   Title independent with advanced HEP   Time 8   Period Weeks   Status New     PT LONG TERM GOAL #2   Title FOTO < or = to 47%   Time 8   Period Weeks   Status New     PT  LONG TERM GOAL #3   Title Pt will be able to stand for at least 30 minutes in order to prepare meal   Time 8   Period Weeks   Status New     PT LONG TERM GOAL #4   Title Pt will be able to do grocery shopping without feeling like her legs wanting to give out   Time 8   Period Weeks   Status New     PT LONG TERM GOAL #5   Title Improve TUG to <14sec to demonstrate decreased risk of falls   Time 8   Period Weeks   Status New               Plan - 05/25/16 [id number]    Clinical Impression Statement Significant reduction in back pain since last session. Pt was given a new stretch for side bending today. RTQL trigger point still active , decreasing in size and intensity of soreness, in addition  generial soft tissue of lumbosacral to hip becoming more supple.  Anticipate more progress in the following week.    Rehab Potential Excellent   Clinical Impairments Affecting Rehab Potential severe arthritis Rt knee, history of abdominal surgery, history of DVT   PT Frequency 2x / week   PT Duration 8 weeks   PT Treatment/Interventions Cryotherapy;Electrical Stimulation;Moist Heat;Therapeutic activities;Therapeutic exercise;Balance training;Neuromuscular re-education;Patient/family education;Manual techniques;Biofeedback;Passive range of motion;Taping;Dry needling   PT Next Visit Plan Begin core strength and syanging hip kicks. Manual to RTLE/pelvis   Consulted and Agree with Plan of Care Patient      Patient will benefit from skilled therapeutic intervention in order to improve the following deficits and impairments:  Abnormal gait, Decreased activity tolerance, Decreased balance, Decreased endurance, Decreased range of motion, Difficulty walking, Decreased strength, Increased muscle spasms, Pain  Visit Diagnosis: Other abnormalities of gait and mobility  Muscle weakness (generalized)     Problem List Patient Active Problem List   Diagnosis Date Noted  . Unilateral primary  osteoarthritis, right knee 04/07/2016  . Acute gout of left foot 03/22/2016  . Pain in joint, lower leg 08/28/2014  . Trigeminal neuralgia   . Encounter for therapeutic drug monitoring 10/21/2013  .  Syncope 12/29/2011  . Chronic low back pain 09/13/2010  . Allergic rhinitis due to pollen 06/25/2010  . COPD mixed type ([place]) 01/22/2010  . Solitary pulmonary nodule 01/12/2010  . Hyperglycemia 01/06/2009  . Osteopenia 01/06/2009  . ECZEMA 11/26/2007  . Peripheral arterial disease ([place]) 06/29/2007  . Hyperlipidemia 10/23/2006  . Essential hypertension 10/23/2006  . ESOPHAGEAL STRICTURE 08/09/1993    Kollins Fenter, PTA 05/25/2016, 10:10 AM  [facility] [address], [place], [name], [address] Phone: [phone]   Fax:  [phone]  Name: [name] [name] MRN: [id number] Date of Birth: 18-Aug-1924

## 2016-05-25 NOTE — Patient Instructions (Signed)
Side Stretch    With feet shoulder width apart, make sure your other hand is supporting your back on the chair or countertop. Inhale. Then exhale while bending directly to side, keeping top arm outstretched. Hold 1 count. Slowly return to starting position. Repeat to other side. Repeat _1-3___ times, each side. Do this a few times during the day. Copyright  VHI. All rights reserved.

## 2016-05-27 ENCOUNTER — Ambulatory Visit (INDEPENDENT_AMBULATORY_CARE_PROVIDER_SITE_OTHER): Payer: Medicare Other | Admitting: General Practice

## 2016-05-27 DIAGNOSIS — I749 Embolism and thrombosis of unspecified artery: Secondary | ICD-10-CM | POA: Diagnosis not present

## 2016-05-27 DIAGNOSIS — Z5181 Encounter for therapeutic drug level monitoring: Secondary | ICD-10-CM

## 2016-05-27 LAB — POCT INR: INR: 2.4

## 2016-05-27 NOTE — Patient Instructions (Signed)
Pre visit review using our clinic review tool, if applicable. No additional management support is needed unless otherwise documented below in the visit note. 

## 2016-05-27 NOTE — Progress Notes (Signed)
I have reviewed and agree with the plan. 

## 2016-05-28 ENCOUNTER — Other Ambulatory Visit: Payer: Self-pay | Admitting: Family Medicine

## 2016-05-30 ENCOUNTER — Ambulatory Visit: Payer: Medicare Other | Attending: Physical Medicine & Rehabilitation | Admitting: Physical Therapy

## 2016-05-30 ENCOUNTER — Encounter: Payer: Self-pay | Admitting: Physical Therapy

## 2016-05-30 DIAGNOSIS — M6281 Muscle weakness (generalized): Secondary | ICD-10-CM | POA: Insufficient documentation

## 2016-05-30 DIAGNOSIS — R2689 Other abnormalities of gait and mobility: Secondary | ICD-10-CM | POA: Insufficient documentation

## 2016-05-30 NOTE — Patient Instructions (Signed)
Hip Abduction (Standing)    Stand with support. Lift right leg out to side, keeping toe forward. Alternate sides, lifting with smooth motion.   Repeat _10__ times. Do _2__ times a day. Repeat with other leg.   EXTENSION: Standing (Active)    Stand, both feet flat. Lift leg back, tapping toe, keep knee straight. Move the thigh bone. Complete _1__ sets of __10_ repetitions. Perform __2_ sessions per day.  http://gtsc.exer.us/77   Copyright  VHI. All rights reserved.   High Stepping in Place (Standing)    Stand with support and feet together. Alternately lift knees. Keep torso erect. Repeat __10__ times, each leg.  Copyright  VHI. All rights reserved.      Copyright  VHI. All rights reserved.

## 2016-05-30 NOTE — Therapy (Signed)
[facility] [address] [place], [name], [address] Phone: [phone]   Fax:  [phone]  Physical Therapy Treatment  Patient Details  Name: [name] [name] MRN: [id number] Date of Birth: [birth year] Referring Provider: Jamse Arn, MD  Encounter Date: 05/30/2016      PT End of Session - 05/30/16 1006    Visit Number 5   Number of Visits 10   Date for PT Re-Evaluation 07-17-16   Authorization Type g codes at 10th visit   PT Start Time 0930   PT Stop Time 1030   PT Time Calculation (min) 60 min   Activity Tolerance Patient tolerated treatment well   Behavior During Therapy [facility] for tasks assessed/performed      Past Medical History:  Diagnosis Date  . Allergic rhinoconjunctivitis   . Anxiety    pt. managed- uses deep breathing   . Arthritis    low back , stenosis  . COLONIC POLYPS, RECURRENT 08/29/2006   2008 last colonoscopy. No further colonoscopy.     Marland Kitchen COPD (chronic obstructive pulmonary disease) ([place])   . Diverticulosis   . DVT (deep venous thrombosis) ([place])   . Eczema   . Environmental allergies    allergy shot- q friday in Dr. Janee Morn office. PFT's abnormal- recommended Spiriva to use preop & will d/c after surgery  . GERD (gastroesophageal reflux disease)   . H/O hiatal hernia   . History of colonic polyps   . Hyperlipidemia   . Hypertension   . Lung nodule 2011  . Peripheral vascular disease ([place])   . Shingles   . Stenosis of popliteal artery (HCC)    blood clots in legs long ago    . Trigeminal neuralgia     Past Surgical History:  Procedure Laterality Date  . ABDOMINAL AORTAGRAM N/A 06/17/2011   Procedure: ABDOMINAL Maxcine Ham;  Surgeon: Elam Dutch, MD;  Location: [facility] CATH LAB;  Service: Cardiovascular;  Laterality: N/A;  . CHOLECYSTECTOMY  1998  . EYE SURGERY     cataracts removed- bilateral /w IOL  . FEMORAL-POPLITEAL BYPASS GRAFT        x2 surgeries 1990's & 2009  .  INJECTION KNEE Right Aug. 2016   Gel injection for pain  . LUMBAR FUSION  07/06/2011  . TONSILLECTOMY     as a teenager   . TUBAL LIGATION      There were no vitals filed for this visit.      Subjective Assessment - 05/30/16 0938    Subjective Back continues to feel better, even when it rained over the weekend.    Currently in Pain? No/denies   Multiple Pain Sites No                         [facility] Adult PT Treatment/Exercise - 05/30/16 0001      Moist Heat Therapy   Number Minutes Moist Heat 15 Minutes   Moist Heat Location --  Rt lumbar to hip     Electrical Stimulation   Electrical Stimulation Location RT lumbar to hip and pelvis   Electrical Stimulation Action IFC   Electrical Stimulation Parameters 80-150 HZ   Electrical Stimulation Goals Pain     Manual Therapy   Manual Therapy Soft tissue mobilization   Soft tissue mobilization Lumbar in sidelying   Pt needs to be semi reclined                PT  Education - 05/30/16 [phone]    Education provided Yes   Education Details HEP addition: standing hip 3 ways.    Person(s) Educated Patient   Methods Explanation;Demonstration;Tactile cues;Verbal cues;Handout   Comprehension Verbalized understanding;Returned demonstration          PT Short Term Goals - 05/30/16 1013      PT SHORT TERM GOAL #1   Title independent with initial HEP   Period Weeks   Status Achieved     PT SHORT TERM GOAL #3   Title able to stand for 20 minutes due to increased strength and endurance   Time 4   Period Weeks   Status Achieved           PT Long Term Goals - 05/16/16 1139      PT LONG TERM GOAL #1   Title independent with advanced HEP   Time 8   Period Weeks   Status New     PT LONG TERM GOAL #2   Title FOTO < or = to 47%   Time 8   Period Weeks   Status New     PT LONG TERM GOAL #3   Title Pt will be able to stand for at least 30 minutes in order to prepare meal   Time 8   Period Weeks    Status New     PT LONG TERM GOAL #4   Title Pt will be able to do grocery shopping without feeling like her legs wanting to give out   Time 8   Period Weeks   Status New     PT LONG TERM GOAL #5   Title Improve TUG to <14sec to demonstrate decreased risk of falls   Time 8   Period Weeks   Status New               Plan - 05/30/16 1015    Clinical Impression Statement Pt has had virtually no back pain since beginning the manual work to her back muscles. She is up and moving around better, but still is limited in longer periods of standing. She is currently comfortable with 15 min of standing.  All STG met this week.    Rehab Potential Excellent   Clinical Impairments Affecting Rehab Potential severe arthritis Rt knee, history of abdominal surgery, history of DVT   PT Frequency 2x / week   PT Duration 8 weeks   PT Treatment/Interventions Cryotherapy;Electrical Stimulation;Moist Heat;Therapeutic activities;Therapeutic exercise;Balance training;Neuromuscular re-education;Patient/family education;Manual techniques;Biofeedback;Passive range of motion;Taping;Dry needling   PT Next Visit Plan Review current HEP, manual, ESTIM at end   Consulted and Agree with Plan of Care Patient      Patient will benefit from skilled therapeutic intervention in order to improve the following deficits and impairments:  Abnormal gait, Decreased activity tolerance, Decreased balance, Decreased endurance, Decreased range of motion, Difficulty walking, Decreased strength, Increased muscle spasms, Pain  Visit Diagnosis: Other abnormalities of gait and mobility  Muscle weakness (generalized)     Problem List Patient Active Problem List   Diagnosis Date Noted  . Unilateral primary osteoarthritis, right knee 04/07/2016  . Acute gout of left foot 03/22/2016  . Pain in joint, lower leg 08/28/2014  . Trigeminal neuralgia   . Encounter for therapeutic drug monitoring 10/21/2013  . Syncope 12/29/2011   . Chronic low back pain 09/13/2010  . Allergic rhinitis due to pollen 06/25/2010  . COPD mixed type ([place]) 01/22/2010  . Solitary pulmonary nodule 01/12/2010  . Hyperglycemia 01/06/2009  .  Osteopenia 01/06/2009  . ECZEMA 11/26/2007  . Peripheral arterial disease ([place]) 06/29/2007  . Hyperlipidemia 10/23/2006  . Essential hypertension 10/23/2006  . ESOPHAGEAL STRICTURE 08/09/1993    Yasemin Rabon, PTA 05/30/2016, 10:17 AM  [facility] [address] [place], [name], [address] Phone: [phone]   Fax:  [phone]  Name: [name] [name] MRN: [id number] Date of Birth: 1924-12-19

## 2016-06-01 ENCOUNTER — Ambulatory Visit: Payer: Medicare Other | Admitting: Physical Therapy

## 2016-06-01 ENCOUNTER — Encounter: Payer: Self-pay | Admitting: Physical Therapy

## 2016-06-01 DIAGNOSIS — M6281 Muscle weakness (generalized): Secondary | ICD-10-CM | POA: Diagnosis not present

## 2016-06-01 DIAGNOSIS — R2689 Other abnormalities of gait and mobility: Secondary | ICD-10-CM | POA: Diagnosis not present

## 2016-06-01 NOTE — Therapy (Signed)
[facility] [address] [place], [name], [address] Phone: [phone]   Fax:  [phone]  Physical Therapy Treatment  Patient Details  Name: [name] [name] MRN: [id number] Date of Birth: [birth year] Referring Provider: Jamse Arn, MD  Encounter Date: 05/30/2016      PT End of Session - 06/01/16 0929    Visit Number 6   Number of Visits 10   Date for PT Re-Evaluation 2016-07-12   Authorization Type g codes at 10th visit   PT Start Time 0920   Activity Tolerance Patient tolerated treatment well   Behavior During Therapy [facility] for tasks assessed/performed      Past Medical History:  Diagnosis Date  . Allergic rhinoconjunctivitis   . Anxiety    pt. managed- uses deep breathing   . Arthritis    low back , stenosis  . COLONIC POLYPS, RECURRENT 08/29/2006   2008 last colonoscopy. No further colonoscopy.     Marland Kitchen COPD (chronic obstructive pulmonary disease) ([place])   . Diverticulosis   . DVT (deep venous thrombosis) ([place])   . Eczema   . Environmental allergies    allergy shot- q friday in Dr. Janee Morn office. PFT's abnormal- recommended Spiriva to use preop & will d/c after surgery  . GERD (gastroesophageal reflux disease)   . H/O hiatal hernia   . History of colonic polyps   . Hyperlipidemia   . Hypertension   . Lung nodule 2011  . Peripheral vascular disease ([place])   . Shingles   . Stenosis of popliteal artery (HCC)    blood clots in legs long ago    . Trigeminal neuralgia     Past Surgical History:  Procedure Laterality Date  . ABDOMINAL AORTAGRAM N/A 06/17/2011   Procedure: ABDOMINAL Maxcine Ham;  Surgeon: Elam Dutch, MD;  Location: [facility] CATH LAB;  Service: Cardiovascular;  Laterality: N/A;  . CHOLECYSTECTOMY  1998  . EYE SURGERY     cataracts removed- bilateral /w IOL  . FEMORAL-POPLITEAL BYPASS GRAFT        x2 surgeries 1990's & 2009  . INJECTION KNEE Right Aug. 2016   Gel injection for pain  .  LUMBAR FUSION  07/06/2011  . TONSILLECTOMY     as a teenager   . TUBAL LIGATION      There were no vitals filed for this visit.      Subjective Assessment - 06/01/16 0926    Subjective A little more sore this AM. Pt attributes to the rain. I can get out of bed now and stand staight up now.   Currently in Pain? Yes   Pain Score 3    Pain Location Back   Pain Orientation Lower   Pain Descriptors / Indicators Sore   Aggravating Factors  A lot of of standing   Pain Relieving Factors Exercises   Multiple Pain Sites No                         [facility] Adult PT Treatment/Exercise - 06/01/16 0001      Lumbar Exercises: Aerobic   Stationary Bike Nustep L2 x10 min  PTA present                  PT Short Term Goals - 05/30/16 1013      PT SHORT TERM GOAL #1   Title independent with initial HEP   Period Weeks   Status Achieved     PT  SHORT TERM GOAL #3   Title able to stand for 20 minutes due to increased strength and endurance   Time 4   Period Weeks   Status Achieved           PT Long Term Goals - 05/16/16 1139      PT LONG TERM GOAL #1   Title independent with advanced HEP   Time 8   Period Weeks   Status New     PT LONG TERM GOAL #2   Title FOTO < or = to 47%   Time 8   Period Weeks   Status New     PT LONG TERM GOAL #3   Title Pt will be able to stand for at least 30 minutes in order to prepare meal   Time 8   Period Weeks   Status New     PT LONG TERM GOAL #4   Title Pt will be able to do grocery shopping without feeling like her legs wanting to give out   Time 8   Period Weeks   Status New     PT LONG TERM GOAL #5   Title Improve TUG to <14sec to demonstrate decreased risk of falls   Time 8   Period Weeks   Status New               Plan - 06/01/16 0929    Rehab Potential Excellent   Clinical Impairments Affecting Rehab Potential severe arthritis Rt knee, history of abdominal surgery, history of DVT   PT  Frequency 2x / week   PT Duration 8 weeks   PT Treatment/Interventions Cryotherapy;Electrical Stimulation;Moist Heat;Therapeutic activities;Therapeutic exercise;Balance training;Neuromuscular re-education;Patient/family education;Manual techniques;Biofeedback;Passive range of motion;Taping;Dry needling   Consulted and Agree with Plan of Care Patient      Patient will benefit from skilled therapeutic intervention in order to improve the following deficits and impairments:  Abnormal gait, Decreased activity tolerance, Decreased balance, Decreased endurance, Decreased range of motion, Difficulty walking, Decreased strength, Increased muscle spasms, Pain  Visit Diagnosis: Other abnormalities of gait and mobility  Muscle weakness (generalized)     Problem List Patient Active Problem List   Diagnosis Date Noted  . Unilateral primary osteoarthritis, right knee 04/07/2016  . Acute gout of left foot 03/22/2016  . Pain in joint, lower leg 08/28/2014  . Trigeminal neuralgia   . Encounter for therapeutic drug monitoring 10/21/2013  . Syncope 12/29/2011  . Chronic low back pain 09/13/2010  . Allergic rhinitis due to pollen 06/25/2010  . COPD mixed type ([place]) 01/22/2010  . Solitary pulmonary nodule 01/12/2010  . Hyperglycemia 01/06/2009  . Osteopenia 01/06/2009  . ECZEMA 11/26/2007  . Peripheral arterial disease ([place]) 06/29/2007  . Hyperlipidemia 10/23/2006  . Essential hypertension 10/23/2006  . ESOPHAGEAL STRICTURE 08/09/1993    Corbett Moulder 06/01/2016, 9:31 AM  [facility] [address] [place], [name], [address] Phone: [phone]   Fax:  [phone]  Name: [name] [name] MRN: [id number] Date of Birth: 02-18-1925

## 2016-06-01 NOTE — Therapy (Signed)
[facility] [address], [place], [name], [address] Phone: [phone]   Fax:  [phone]  Physical Therapy Treatment  Patient Details  Name: [name] [name] MRN: [id number] Date of Birth: December 18, 1924 Referring Provider: Jamse Arn, MD  Encounter Date: 06/01/2016      PT End of Session - 06/01/16 0929    Visit Number 6   Number of Visits 10   Date for PT Re-Evaluation 07/30/2016   Authorization Type g codes at 10th visit   PT Start Time 0920   PT Stop Time 1015   PT Time Calculation (min) 55 min   Activity Tolerance Patient tolerated treatment well   Behavior During Therapy [facility] for tasks assessed/performed      Past Medical History:  Diagnosis Date  . Allergic rhinoconjunctivitis   . Anxiety    pt. managed- uses deep breathing   . Arthritis    low back , stenosis  . COLONIC POLYPS, RECURRENT 08/29/2006   2008 last colonoscopy. No further colonoscopy.     Marland Kitchen COPD (chronic obstructive pulmonary disease) ([place])   . Diverticulosis   . DVT (deep venous thrombosis) ([place])   . Eczema   . Environmental allergies    allergy shot- q friday in Dr. Janee Morn office. PFT's abnormal- recommended Spiriva to use preop & will d/c after surgery  . GERD (gastroesophageal reflux disease)   . H/O hiatal hernia   . History of colonic polyps   . Hyperlipidemia   . Hypertension   . Lung nodule 2011  . Peripheral vascular disease ([place])   . Shingles   . Stenosis of popliteal artery (HCC)    blood clots in legs long ago    . Trigeminal neuralgia     Past Surgical History:  Procedure Laterality Date  . ABDOMINAL AORTAGRAM N/A 06/17/2011   Procedure: ABDOMINAL Maxcine Ham;  Surgeon: Elam Dutch, MD;  Location: [facility] CATH LAB;  Service: Cardiovascular;  Laterality: N/A;  . CHOLECYSTECTOMY  1998  . EYE SURGERY     cataracts removed- bilateral /w IOL  . FEMORAL-POPLITEAL BYPASS GRAFT        x2 surgeries 1990's & 2009  .  INJECTION KNEE Right Aug. 2016   Gel injection for pain  . LUMBAR FUSION  07/06/2011  . TONSILLECTOMY     as a teenager   . TUBAL LIGATION      There were no vitals filed for this visit.      Subjective Assessment - 06/01/16 0926    Subjective A little more sore this AM. Pt attributes to the rain. I can get out of bed now and stand staight up now.   Currently in Pain? Yes   Pain Score 3    Pain Location Back   Pain Orientation Lower   Pain Descriptors / Indicators Sore   Aggravating Factors  A lot of of standing   Pain Relieving Factors Exercises   Multiple Pain Sites No                         [facility] Adult PT Treatment/Exercise - 06/01/16 0001      Lumbar Exercises: Stretches   Standing Side Bend --  4x Bil holding onto bar, TC to correct plane     Lumbar Exercises: Aerobic   Stationary Bike Nustep L2 x10 min  PTA present     Lumbar Exercises: Standing   Heel Raises 10 reps   Other Standing  Lumbar Exercises Hip 3 ways: abd, flex, ext 10x each  VC for posture & core activation     Moist Heat Therapy   Moist Heat Location --  Rt lumbar to hip     Electrical Stimulation   Electrical Stimulation Location Bil lumbar to RT hip   Electrical Stimulation Action IFC   Electrical Stimulation Parameters 80-'150HZ'$   Pt is S-L position, needing to be semireclined, not flat   Electrical Stimulation Goals Pain     Manual Therapy   Manual Therapy Soft tissue mobilization   Soft tissue mobilization Lumbar in sidelying   Pt needs to be semi reclined                  PT Short Term Goals - 06/01/16 1010      PT SHORT TERM GOAL #2   Title improved upright posture in standing due to increased hip extension and increased glute strength   Time 4   Period Weeks   Status Achieved           PT Long Term Goals - 05/16/16 1139      PT LONG TERM GOAL #1   Title independent with advanced HEP   Time 8   Period Weeks   Status New     PT LONG TERM  GOAL #2   Title FOTO < or = to 47%   Time 8   Period Weeks   Status New     PT LONG TERM GOAL #3   Title Pt will be able to stand for at least 30 minutes in order to prepare meal   Time 8   Period Weeks   Status New     PT LONG TERM GOAL #4   Title Pt will be able to do grocery shopping without feeling like her legs wanting to give out   Time 8   Period Weeks   Status New     PT LONG TERM GOAL #5   Title Improve TUG to <14sec to demonstrate decreased risk of falls   Time 8   Period Weeks   Status New               Plan - 06/01/16 7564    Clinical Impression Statement Pt could complete 3 standing exercises before needing to sit down. The rest break was only due to LE fatigue, not back pain. Pt is compliant with her HEP, and reports she feels much less pain upon getting up in the morning. Deep lumbar muscles remain with tender trigger points RT>LT. All STGs are met as of this week.      Rehab Potential Excellent   Clinical Impairments Affecting Rehab Potential severe arthritis Rt knee, history of abdominal surgery, history of DVT   PT Frequency 2x / week   PT Duration 8 weeks   PT Treatment/Interventions Cryotherapy;Electrical Stimulation;Moist Heat;Therapeutic activities;Therapeutic exercise;Balance training;Neuromuscular re-education;Patient/family education;Manual techniques;Biofeedback;Passive range of motion;Taping;Dry needling   PT Next Visit Plan Add core stabs to program and progressively to HEP. Manual and Estim to end.   Consulted and Agree with Plan of Care --      Patient will benefit from skilled therapeutic intervention in order to improve the following deficits and impairments:  Abnormal gait, Decreased activity tolerance, Decreased balance, Decreased endurance, Decreased range of motion, Difficulty walking, Decreased strength, Increased muscle spasms, Pain  Visit Diagnosis: Other abnormalities of gait and mobility  Muscle weakness  (generalized)     Problem List Patient Active Problem List  Diagnosis Date Noted  . Unilateral primary osteoarthritis, right knee 04/07/2016  . Acute gout of left foot 03/22/2016  . Pain in joint, lower leg 08/28/2014  . Trigeminal neuralgia   . Encounter for therapeutic drug monitoring 10/21/2013  . Syncope 12/29/2011  . Chronic low back pain 09/13/2010  . Allergic rhinitis due to pollen 06/25/2010  . COPD mixed type ([place]) 01/22/2010  . Solitary pulmonary nodule 01/12/2010  . Hyperglycemia 01/06/2009  . Osteopenia 01/06/2009  . ECZEMA 11/26/2007  . Peripheral arterial disease ([place]) 06/29/2007  . Hyperlipidemia 10/23/2006  . Essential hypertension 10/23/2006  . ESOPHAGEAL STRICTURE 08/09/1993    COCHRAN,JENNIFER, PTA 06/01/2016, 10:12 AM  [facility] [address], [place], [name], [address] Phone: [phone]   Fax:  [phone]  Name: [name] [name] MRN: [id number] Date of Birth: [birth year]

## 2016-06-06 ENCOUNTER — Ambulatory Visit: Payer: Medicare Other | Admitting: Physical Therapy

## 2016-06-06 ENCOUNTER — Encounter: Payer: Self-pay | Admitting: Physical Therapy

## 2016-06-06 DIAGNOSIS — L301 Dyshidrosis [pompholyx]: Secondary | ICD-10-CM | POA: Diagnosis not present

## 2016-06-06 DIAGNOSIS — M6281 Muscle weakness (generalized): Secondary | ICD-10-CM | POA: Diagnosis not present

## 2016-06-06 DIAGNOSIS — R2689 Other abnormalities of gait and mobility: Secondary | ICD-10-CM | POA: Diagnosis not present

## 2016-06-06 NOTE — Therapy (Signed)
[facility] [address], [place], [name], [address] Phone: [phone]   Fax:  [phone]  Physical Therapy Treatment  Patient Details  Name: [name] [name] MRN: [id number] Date of Birth: 01/01/1925 Referring Provider: Jamse Arn, MD  Encounter Date: 06/06/2016      PT End of Session - 06/06/16 0935    Visit Number 7   Number of Visits 10   Date for PT Re-Evaluation 07/27/16   Authorization Type g codes at 10th visit   PT Start Time 0930   PT Stop Time 1027   PT Time Calculation (min) 57 min   Activity Tolerance Patient limited by pain   Behavior During Therapy [facility] for tasks assessed/performed      Past Medical History:  Diagnosis Date  . Allergic rhinoconjunctivitis   . Anxiety    pt. managed- uses deep breathing   . Arthritis    low back , stenosis  . COLONIC POLYPS, RECURRENT 08/29/2006   2008 last colonoscopy. No further colonoscopy.     Marland Kitchen COPD (chronic obstructive pulmonary disease) ([place])   . Diverticulosis   . DVT (deep venous thrombosis) ([place])   . Eczema   . Environmental allergies    allergy shot- q friday in Dr. Janee Morn office. PFT's abnormal- recommended Spiriva to use preop & will d/c after surgery  . GERD (gastroesophageal reflux disease)   . H/O hiatal hernia   . History of colonic polyps   . Hyperlipidemia   . Hypertension   . Lung nodule 2011  . Peripheral vascular disease ([place])   . Shingles   . Stenosis of popliteal artery (HCC)    blood clots in legs long ago    . Trigeminal neuralgia     Past Surgical History:  Procedure Laterality Date  . ABDOMINAL AORTAGRAM N/A 06/17/2011   Procedure: ABDOMINAL Maxcine Ham;  Surgeon: Elam Dutch, MD;  Location: [facility] CATH LAB;  Service: Cardiovascular;  Laterality: N/A;  . CHOLECYSTECTOMY  1998  . EYE SURGERY     cataracts removed- bilateral /w IOL  . FEMORAL-POPLITEAL BYPASS GRAFT        x2 surgeries 1990's & 2009  . INJECTION  KNEE Right Aug. 2016   Gel injection for pain  . LUMBAR FUSION  07/06/2011  . TONSILLECTOMY     as a teenager   . TUBAL LIGATION      There were no vitals filed for this visit.      Subjective Assessment - 06/06/16 0937    Subjective A set back from Sunday, my pain has increased after sitting for around an hour. She also thinks the weather/rain continues to play apart in her pain.    Currently in Pain? Yes   Pain Score 5    Pain Location Back   Pain Orientation Lower   Pain Descriptors / Indicators Sharp   Aggravating Factors  Carrying a load/standing.   Pain Relieving Factors Sitting   Multiple Pain Sites No                         [facility] Adult PT Treatment/Exercise - 06/06/16 0001      Lumbar Exercises: Aerobic   Stationary Bike Nustep L1 x 8 min  Concurrent verbal review of status      Moist Heat Therapy   Moist Heat Location --  Rt lumbar to hip     Electrical Stimulation   Electrical Stimulation Location Bil lumbar to  RT hip   Electrical Stimulation Action IFC   Electrical Stimulation Parameters 80-150 HZ   Electrical Stimulation Goals Pain     Manual Therapy   Manual Therapy Soft tissue mobilization   Soft tissue mobilization Lumbar in sidelying   Pt needs to be semi reclined, also RT hip muscles                  PT Short Term Goals - 06/01/16 1010      PT SHORT TERM GOAL #2   Title improved upright posture in standing due to increased hip extension and increased glute strength   Time 4   Period Weeks   Status Achieved           PT Long Term Goals - 05/16/16 1139      PT LONG TERM GOAL #1   Title independent with advanced HEP   Time 8   Period Weeks   Status New     PT LONG TERM GOAL #2   Title FOTO < or = to 47%   Time 8   Period Weeks   Status New     PT LONG TERM GOAL #3   Title Pt will be able to stand for at least 30 minutes in order to prepare meal   Time 8   Period Weeks   Status New     PT LONG TERM  GOAL #4   Title Pt will be able to do grocery shopping without feeling like her legs wanting to give out   Time 8   Period Weeks   Status New     PT LONG TERM GOAL #5   Title Improve TUG to <14sec to demonstrate decreased risk of falls   Time 8   Period Weeks   Status New               Plan - 06/06/16 0936    Clinical Impression Statement Pt had set back after attending church this past Sunday per her report. She feels a combination of the weather ans sitting on a hard pew increased her pain where she needed assistance to get to her car.  Muscularly pt has improved in the RT lumbar and hip as she has far less active trigger points and tenderness. Discussed possible joint pain over the last day or so. Pt did reporet pain relief after treatment.       Patient will benefit from skilled therapeutic intervention in order to improve the following deficits and impairments:     Visit Diagnosis: Other abnormalities of gait and mobility  Muscle weakness (generalized)     Problem List Patient Active Problem List   Diagnosis Date Noted  . Unilateral primary osteoarthritis, right knee 04/07/2016  . Acute gout of left foot 03/22/2016  . Pain in joint, lower leg 08/28/2014  . Trigeminal neuralgia   . Encounter for therapeutic drug monitoring 10/21/2013  . Syncope 12/29/2011  . Chronic low back pain 09/13/2010  . Allergic rhinitis due to pollen 06/25/2010  . COPD mixed type ([place]) 01/22/2010  . Solitary pulmonary nodule 01/12/2010  . Hyperglycemia 01/06/2009  . Osteopenia 01/06/2009  . ECZEMA 11/26/2007  . Peripheral arterial disease ([place]) 06/29/2007  . Hyperlipidemia 10/23/2006  . Essential hypertension 10/23/2006  . ESOPHAGEAL STRICTURE 08/09/1993    Tellis Spivak, PTA 06/06/2016, 10:12 AM  [facility] [address], [place], [name], [address] Phone: [phone]   Fax:  [phone]  Name: [name] [name]  [name] [name] MRN: [id number] Date of Birth: 19-Aug-1924

## 2016-06-08 ENCOUNTER — Encounter: Payer: Self-pay | Admitting: Physical Therapy

## 2016-06-08 ENCOUNTER — Ambulatory Visit: Payer: Medicare Other | Admitting: Physical Therapy

## 2016-06-08 DIAGNOSIS — R2689 Other abnormalities of gait and mobility: Secondary | ICD-10-CM

## 2016-06-08 DIAGNOSIS — M6281 Muscle weakness (generalized): Secondary | ICD-10-CM

## 2016-06-08 NOTE — Patient Instructions (Signed)
Lower abdominal/core stability exercises  1. Practice your breathing technique: Inhale through your nose expanding your belly and rib cage. Try not to breathe into your chest. Exhale slowly and gradually out your mouth feeling a sense of softness to your body. Practice multiple times. This can be performed unlimited.  2. Finding the lower abdominals. Laying on your back with the knees bent, place your fingers just below your belly button. Using your breathing technique from above, on your exhale gently pull the belly button away from your fingertips without tensing any other muscles. Practice this 5x. Next, as you exhale, draw belly button inwards and hold onto it...then feel as if you are pulling that muscle across your pelvis like you are tightening a belt. This can be hard to do at first so be patient and practice. Do 5-10 reps 1-3 x day. Always recognize quality over quantity; if your abdominal muscles become tired you will notice you may tighten/contract other muscles. This is the time to take a break.   Practice this first laying on your back, then in sitting, progressing to standing and finally adding it to all your daily movements.   3. Finding your pelvic floor. Using the breathing technique above, when your exhale, this time draw your pelvic floor muscles up as if you were attempting to stop the flow of urination. Be careful NOT to tense any other muscles. This can be hard, BE PATIENT. Try to hold up to 10 seconds repeating 10x. Try 2x a day. Once you feel you are doing this well, add this contraction to exercise #2. First contracting your pelvic floor followed by lower abdominals.  4. Adding leg movements. Add the following leg movements to challenge your ability to keep your core stable:  1. Single leg drop outs: Laying on your back with knees bent feet flat. Inhale,  dropping one knee outward KEEPING YOUR PELVIS STILL. Exhale as you bring the leg back, simultaneously performing your lower  abdominal contraction. Do 5-10 on each leg.  2. Marching: While keeping your pelvis still, lift the right foot a few inches, put it down then lift left foot. This will mimic a march. Start slow to establish control. Once you have control you may speed it up. Do 10-20x. You MUST keep your lower abdominlas contracted while you march. Breathe naturally   3. Single leg slides: Inhale while you slowly slide one leg out keeping your pelvis still. Only slide your leg as far as you can keep your pelvis still. Exhale as you bring the leg back to the start, contracting the lower abdominals as you do that. Keep your upper body relaxed. Do 5-10 on each side. Lower abdominal/core stability exercises  1. Practice your breathing technique: Inhale through your nose expanding your belly and rib cage. Try not to breathe into your chest. Exhale slowly and gradually out your mouth feeling a sense of softness to your body. Practice multiple times. This can be performed unlimited.  2. Finding the lower abdominals. Laying on your back with the knees bent, place your fingers just below your belly button. Using your breathing technique from above, on your exhale gently pull the belly button away from your fingertips without tensing any other muscles. Practice this 5x. Next, as you exhale, draw belly button inwards and hold onto it...then feel as if you are pulling that muscle across your pelvis like you are tightening a belt. This can be hard to do at first so be patient and practice. Do 5-10   reps 1-3 x day. Always recognize quality over quantity; if your abdominal muscles become tired you will notice you may tighten/contract other muscles. This is the time to take a break.   Practice this first laying on your back, then in sitting, progressing to standing and finally adding it to all your daily movements.   3. Finding your pelvic floor. Using the breathing technique above, when your exhale, this time draw your pelvic floor  muscles up as if you were attempting to stop the flow of urination. Be careful NOT to tense any other muscles. This can be hard, BE PATIENT. Try to hold up to 10 seconds repeating 10x. Try 2x a day. Once you feel you are doing this well, add this contraction to exercise #2. First contracting your pelvic floor followed by lower abdominals.   4. Adding leg movements. Add the following leg movements to challenge your ability to keep your core stable:  1. Single leg drop outs: Laying on your back with knees bent feet flat. Inhale,  dropping one knee outward KEEPING YOUR PELVIS STILL. Exhale as you bring the leg back, simultaneously performing your lower abdominal contraction. Do 5-10 on each leg.   2. Marching: While keeping your pelvis still, lift the right foot a few inches, put it down then lift left foot. This will mimic a march. Start slow to establish control. Once you have control you may speed it up. Do 10-20x. You MUST keep your lower abdominlas contracted while you march. Breathe naturally    3. Single leg slides: Inhale while you slowly slide one leg out keeping your pelvis still. Only slide your leg as far as you can keep your pelvis still. Exhale as you bring the leg back to the start, contracting the lower abdominals as you do that. Keep your upper body relaxed. Do 5-10 on each side.         

## 2016-06-08 NOTE — Therapy (Signed)
[facility] [address] [place], [name], [address] Phone: [phone]   Fax:  [phone]  Physical Therapy Treatment  Patient Details  Name: [name] [name] MRN: [id number] Date of Birth: August 17, 1924 Referring Provider: Jamse Arn, MD  Encounter Date: 06/08/2016      PT End of Session - 06/08/16 0935    Visit Number 8   Number of Visits 10   Date for PT Re-Evaluation 07/25/16   Authorization Type g codes at 10th visit   PT Start Time 0931   PT Stop Time 1015   PT Time Calculation (min) 44 min   Activity Tolerance Patient tolerated treatment well   Behavior During Therapy [facility] for tasks assessed/performed      Past Medical History:  Diagnosis Date  . Allergic rhinoconjunctivitis   . Anxiety    pt. managed- uses deep breathing   . Arthritis    low back , stenosis  . COLONIC POLYPS, RECURRENT 08/29/2006   2008 last colonoscopy. No further colonoscopy.     Marland Kitchen COPD (chronic obstructive pulmonary disease) ([place])   . Diverticulosis   . DVT (deep venous thrombosis) ([place])   . Eczema   . Environmental allergies    allergy shot- q friday in Dr. Janee Morn office. PFT's abnormal- recommended Spiriva to use preop & will d/c after surgery  . GERD (gastroesophageal reflux disease)   . H/O hiatal hernia   . History of colonic polyps   . Hyperlipidemia   . Hypertension   . Lung nodule 2011  . Peripheral vascular disease ([place])   . Shingles   . Stenosis of popliteal artery (HCC)    blood clots in legs long ago    . Trigeminal neuralgia     Past Surgical History:  Procedure Laterality Date  . ABDOMINAL AORTAGRAM N/A 06/17/2011   Procedure: ABDOMINAL Maxcine Ham;  Surgeon: Elam Dutch, MD;  Location: [facility] CATH LAB;  Service: Cardiovascular;  Laterality: N/A;  . CHOLECYSTECTOMY  1998  . EYE SURGERY     cataracts removed- bilateral /w IOL  . FEMORAL-POPLITEAL BYPASS GRAFT        x2 surgeries 1990's & 2009  .  INJECTION KNEE Right Aug. 2016   Gel injection for pain  . LUMBAR FUSION  07/06/2011  . TONSILLECTOMY     as a teenager   . TUBAL LIGATION      There were no vitals filed for this visit.      Subjective Assessment - 06/08/16 0934    Subjective I am much better today, rain has stopped. Very little pain this AM.    Currently in Pain? Yes   Pain Score 2    Pain Location Back   Pain Orientation Lower   Pain Descriptors / Indicators Dull   Multiple Pain Sites No                         [facility] Adult PT Treatment/Exercise - 06/08/16 0001      Lumbar Exercises: Aerobic   Stationary Bike [place] L2 x 10 min  Status review     Lumbar Exercises: Supine   Ab Set --  6x TA contraction with Pilates breath     Moist Heat Therapy   Number Minutes Moist Heat --  To lumbar during supine exercises                PT Education - 06/08/16 0947    Education provided  Yes   Education Details [place] abdominal stabilization exs for HEP progression   Person(s) Educated Patient   Methods Explanation;Demonstration;Tactile cues;Verbal cues;Handout   Comprehension Returned demonstration;Verbalized understanding          PT Short Term Goals - 06/01/16 1010      PT SHORT TERM GOAL #2   Title improved upright posture in standing due to increased hip extension and increased glute strength   Time 4   Period Weeks   Status Achieved           PT Long Term Goals - 05/16/16 1139      PT LONG TERM GOAL #1   Title independent with advanced HEP   Time 8   Period Weeks   Status New     PT LONG TERM GOAL #2   Title FOTO < or = to 47%   Time 8   Period Weeks   Status New     PT LONG TERM GOAL #3   Title Pt will be able to stand for at least 30 minutes in order to prepare meal   Time 8   Period Weeks   Status New     PT LONG TERM GOAL #4   Title Pt will be able to do grocery shopping without feeling like her legs wanting to give out   Time 8   Period Weeks    Status New     PT LONG TERM GOAL #5   Title Improve TUG to <14sec to demonstrate decreased risk of falls   Time 8   Period Weeks   Status New               Plan - 06/08/16 [id number]    Clinical Impression Statement Very little pain today so pt was able to learn another part of her HEP, core strength. Pt initially had difficulty with leg movements, breathing and keeping her core activated, but with practice during the session she improved. No need for modalities today as pain was very minimal. Pt reports compliance with all her HEP exercises at home.    Rehab Potential Excellent   Clinical Impairments Affecting Rehab Potential severe arthritis Rt knee, history of abdominal surgery, history of DVT   PT Frequency 2x / week   PT Duration 8 weeks   PT Treatment/Interventions Cryotherapy;Electrical Stimulation;Moist Heat;Therapeutic activities;Therapeutic exercise;Balance training;Neuromuscular re-education;Patient/family education;Manual techniques;Biofeedback;Passive range of motion;Taping;Dry needling   PT Next Visit Plan Review entire HEP, modalities and manual if needed.    Consulted and Agree with Plan of Care --      Patient will benefit from skilled therapeutic intervention in order to improve the following deficits and impairments:     Visit Diagnosis: Other abnormalities of gait and mobility  Muscle weakness (generalized)     Problem List Patient Active Problem List   Diagnosis Date Noted  . Unilateral primary osteoarthritis, right knee 04/07/2016  . Acute gout of left foot 03/22/2016  . Pain in joint, lower leg 08/28/2014  . Trigeminal neuralgia   . Encounter for therapeutic drug monitoring 10/21/2013  . Syncope 12/29/2011  . Chronic low back pain 09/13/2010  . Allergic rhinitis due to pollen 06/25/2010  . COPD mixed type ([place]) 01/22/2010  . Solitary pulmonary nodule 01/12/2010  . Hyperglycemia 01/06/2009  . Osteopenia 01/06/2009  . ECZEMA 11/26/2007  .  Peripheral arterial disease ([place]) 06/29/2007  . Hyperlipidemia 10/23/2006  . Essential hypertension 10/23/2006  . ESOPHAGEAL STRICTURE 08/09/1993    COCHRAN,JENNIFER, PTA 06/08/2016, 10:11 AM  [facility] [address] [place], [name], [address] Phone: [phone]   Fax:  [phone]  Name: [name] [name] MRN: [id number] Date of Birth: 1924-12-30

## 2016-06-13 ENCOUNTER — Ambulatory Visit: Payer: Medicare Other

## 2016-06-13 DIAGNOSIS — R2689 Other abnormalities of gait and mobility: Secondary | ICD-10-CM | POA: Diagnosis not present

## 2016-06-13 DIAGNOSIS — M6281 Muscle weakness (generalized): Secondary | ICD-10-CM | POA: Diagnosis not present

## 2016-06-13 NOTE — Therapy (Signed)
[facility] [address] [place], [name], [address] Phone: [phone]   Fax:  [phone]  Physical Therapy Treatment  Patient Details  Name: [name] [name] MRN: [id number] Date of Birth: Sep 23, 1924 Referring Provider: Jamse Arn, MD  Encounter Date: 06/13/2016      PT End of Session - 06/13/16 1014    Visit Number 9   Number of Visits 19   Date for PT Re-Evaluation 07-12-16   Authorization Type g codes at 10th visit   PT Start Time 0930   PT Stop Time 1025   PT Time Calculation (min) 55 min   Activity Tolerance Patient tolerated treatment well   Behavior During Therapy [facility] for tasks assessed/performed      Past Medical History:  Diagnosis Date  . Allergic rhinoconjunctivitis   . Anxiety    pt. managed- uses deep breathing   . Arthritis    low back , stenosis  . COLONIC POLYPS, RECURRENT 08/29/2006   2008 last colonoscopy. No further colonoscopy.     Marland Kitchen COPD (chronic obstructive pulmonary disease) ([place])   . Diverticulosis   . DVT (deep venous thrombosis) ([place])   . Eczema   . Environmental allergies    allergy shot- q friday in Dr. Janee Morn office. PFT's abnormal- recommended Spiriva to use preop & will d/c after surgery  . GERD (gastroesophageal reflux disease)   . H/O hiatal hernia   . History of colonic polyps   . Hyperlipidemia   . Hypertension   . Lung nodule 2011  . Peripheral vascular disease ([place])   . Shingles   . Stenosis of popliteal artery (HCC)    blood clots in legs long ago    . Trigeminal neuralgia     Past Surgical History:  Procedure Laterality Date  . ABDOMINAL AORTAGRAM N/A 06/17/2011   Procedure: ABDOMINAL Maxcine Ham;  Surgeon: Elam Dutch, MD;  Location: [facility] CATH LAB;  Service: Cardiovascular;  Laterality: N/A;  . CHOLECYSTECTOMY  1998  . EYE SURGERY     cataracts removed- bilateral /w IOL  . FEMORAL-POPLITEAL BYPASS GRAFT        x2 surgeries 1990's & 2009  .  INJECTION KNEE Right Aug. 2016   Gel injection for pain  . LUMBAR FUSION  07/06/2011  . TONSILLECTOMY     as a teenager   . TUBAL LIGATION      There were no vitals filed for this visit.      Subjective Assessment - 06/13/16 0929    Subjective I'm hurting today.     Currently in Pain? Yes   Pain Score 4    Pain Location Back   Pain Orientation Right;Left;Lower   Pain Descriptors / Indicators Dull   Pain Type Chronic pain   Pain Onset More than a month ago   Pain Frequency Constant   Aggravating Factors  activity, doing things for too long   Pain Relieving Factors sitting, rest, heat            [facility] PT Assessment - 06/13/16 0001      Observation/Other Assessments   Focus on Therapeutic Outcomes (FOTO)  67% limitation                     [facility] Adult PT Treatment/Exercise - 06/13/16 0001      Lumbar Exercises: Aerobic   Stationary Bike [place] L2 x 10 min  Status review     Acupuncturist  Location bil. low back   Electrical Stimulation Action IFC   Electrical Stimulation Parameters 15 minutes   Electrical Stimulation Goals Pain     Manual Therapy   Manual Therapy Soft tissue mobilization   Soft tissue mobilization Lumbar in sidelying   Pt needs to be semi reclined, also RT hip muscles                  PT Short Term Goals - 06/19/2016 0944      PT SHORT TERM GOAL #1   Title independent with initial HEP   Status Achieved           PT Long Term Goals - 2016-06-19 0943      PT LONG TERM GOAL #2   Title FOTO < or = to 47%   Baseline 67% limitaiton- Jun 19, 2016   Time 8   Period Weeks   Status On-going     PT LONG TERM GOAL #3   Title Pt will be able to stand for at least 30 minutes in order to prepare meal   Time 8   Period Weeks   Status On-going               Plan - 06-19-2016 0945    Clinical Impression Statement Pt reports that her pain fluctuates with changes in the weather.  Pt  reports that she feels good after each therapy session and on days that it is sunny.  FOTO is 67% limitation (60% limitation at evaluation).  PT focused on manual and modalities today due to increased pain. Pt will continue to benefit from skilled PT for strength, flexibility, modalities and manual.     Clinical Impairments Affecting Rehab Potential severe arthritis Rt knee, history of abdominal surgery, history of DVT   PT Frequency 2x / week   PT Duration 8 weeks   PT Treatment/Interventions Cryotherapy;Electrical Stimulation;Moist Heat;Therapeutic activities;Therapeutic exercise;Balance training;Neuromuscular re-education;Patient/family education;Manual techniques;Biofeedback;Passive range of motion;Taping;Dry needling   PT Next Visit Plan Review entire HEP, modalities and manual if needed.    Consulted and Agree with Plan of Care Patient      Patient will benefit from skilled therapeutic intervention in order to improve the following deficits and impairments:  Abnormal gait, Decreased activity tolerance, Decreased balance, Decreased endurance, Decreased range of motion, Difficulty walking, Decreased strength, Increased muscle spasms, Pain  Visit Diagnosis: Other abnormalities of gait and mobility  Muscle weakness (generalized)       G-Codes - 06-19-16 0944    Functional Limitation Mobility: Walking and moving around   Mobility: Walking and Moving Around Current Status [phone]) At least 60 percent but less than 80 percent impaired, limited or restricted   Mobility: Walking and Moving Around Goal Status [phone]) At least 40 percent but less than 60 percent impaired, limited or restricted      Problem List Patient Active Problem List   Diagnosis Date Noted  . Unilateral primary osteoarthritis, right knee 04/07/2016  . Acute gout of left foot 03/22/2016  . Pain in joint, lower leg 08/28/2014  . Trigeminal neuralgia   . Encounter for therapeutic drug monitoring 10/21/2013  . Syncope  12/29/2011  . Chronic low back pain 09/13/2010  . Allergic rhinitis due to pollen 06/25/2010  . COPD mixed type ([place]) 01/22/2010  . Solitary pulmonary nodule 01/12/2010  . Hyperglycemia 01/06/2009  . Osteopenia 01/06/2009  . ECZEMA 11/26/2007  . Peripheral arterial disease ([place]) 06/29/2007  . Hyperlipidemia 10/23/2006  . Essential hypertension 10/23/2006  . ESOPHAGEAL STRICTURE 08/09/1993  Sigurd Sos, PT 06/13/16 10:15 AM  [facility] [address] [place], [name], [address] Phone: [phone]   Fax:  [phone]  Name: [name] [name] MRN: [id number] Date of Birth: 1924/11/07

## 2016-06-15 ENCOUNTER — Encounter: Payer: Self-pay | Admitting: Physical Therapy

## 2016-06-15 ENCOUNTER — Ambulatory Visit: Payer: Medicare Other | Admitting: Physical Therapy

## 2016-06-15 DIAGNOSIS — R2689 Other abnormalities of gait and mobility: Secondary | ICD-10-CM

## 2016-06-15 DIAGNOSIS — M6281 Muscle weakness (generalized): Secondary | ICD-10-CM

## 2016-06-15 NOTE — Therapy (Signed)
[facility] [address], [place], [name], [address] Phone: [phone]   Fax:  [phone]  Physical Therapy Treatment  Patient Details  Name: [name] [name] MRN: [id number] Date of Birth: 09-01-1924 Referring Provider: Jamse Arn, MD  Encounter Date: 06/15/2016      PT End of Session - 06/15/16 0931    Visit Number 10   Number of Visits 19   Date for PT Re-Evaluation 2016-07-29   Authorization Type g codes at 10th visit   PT Start Time 0931   PT Stop Time 1030   PT Time Calculation (min) 59 min   Activity Tolerance Patient tolerated treatment well   Behavior During Therapy [facility] for tasks assessed/performed      Past Medical History:  Diagnosis Date  . Allergic rhinoconjunctivitis   . Anxiety    pt. managed- uses deep breathing   . Arthritis    low back , stenosis  . COLONIC POLYPS, RECURRENT 08/29/2006   2008 last colonoscopy. No further colonoscopy.     Marland Kitchen COPD (chronic obstructive pulmonary disease) ([place])   . Diverticulosis   . DVT (deep venous thrombosis) ([place])   . Eczema   . Environmental allergies    allergy shot- q friday in Dr. Janee Morn office. PFT's abnormal- recommended Spiriva to use preop & will d/c after surgery  . GERD (gastroesophageal reflux disease)   . H/O hiatal hernia   . History of colonic polyps   . Hyperlipidemia   . Hypertension   . Lung nodule 2011  . Peripheral vascular disease ([place])   . Shingles   . Stenosis of popliteal artery (HCC)    blood clots in legs long ago    . Trigeminal neuralgia     Past Surgical History:  Procedure Laterality Date  . ABDOMINAL AORTAGRAM N/A 06/17/2011   Procedure: ABDOMINAL Maxcine Ham;  Surgeon: Elam Dutch, MD;  Location: [facility] CATH LAB;  Service: Cardiovascular;  Laterality: N/A;  . CHOLECYSTECTOMY  1998  . EYE SURGERY     cataracts removed- bilateral /w IOL  . FEMORAL-POPLITEAL BYPASS GRAFT        x2 surgeries 1990's & 2009  .  INJECTION KNEE Right Aug. 2016   Gel injection for pain  . LUMBAR FUSION  07/06/2011  . TONSILLECTOMY     as a teenager   . TUBAL LIGATION      There were no vitals filed for this visit.      Subjective Assessment - 06/15/16 0935    Subjective I am better than I was last time.   Currently in Pain? Yes   Pain Score 2    Pain Location Back   Pain Orientation Right;Lower   Pain Descriptors / Indicators Dull   Multiple Pain Sites No            [facility] PT Assessment - 06/15/16 0001      Strength   Overall Strength Comments RTLE grossly 4-4+/5, LTLE 4+/5 grossly                     [facility] Adult PT Treatment/Exercise - 06/15/16 0001      Lumbar Exercises: Aerobic   Stationary Bike Nustep L2 x 10 min  PTA present     Lumbar Exercises: Standing   Heel Raises 10 reps     Moist Heat Therapy   Number Minutes Moist Heat 15 Minutes   Moist Heat Location Lumbar Spine     Electrical  Stimulation   Electrical Stimulation Location bil. low back   Electrical Stimulation Action IFC   Electrical Stimulation Parameters 15 min in sidelying semi reclined   Electrical Stimulation Goals Pain     Manual Therapy   Manual Therapy Soft tissue mobilization   Soft tissue mobilization Lumbar in sidelying   Pt needs to be semi reclined, also RT hip muscles                  PT Short Term Goals - 06/15/16 0936      PT SHORT TERM GOAL #1   Title independent with initial HEP   Time 4   Period Weeks   Status Achieved     PT SHORT TERM GOAL #2   Title improved upright posture in standing due to increased hip extension and increased glute strength   Time 4   Period Weeks   Status Achieved     PT SHORT TERM GOAL #3   Title able to stand for 20 minutes due to increased strength and endurance   Time 4   Period Weeks   Status Achieved           PT Long Term Goals - 06/15/16 0936      PT LONG TERM GOAL #1   Title independent with advanced HEP   Time 8    Period Weeks   Status On-going     PT LONG TERM GOAL #4   Title Pt will be able to do grocery shopping without feeling like her legs wanting to give out   Time 8   Period Weeks   Status Partially Met  Needs cart for support               Plan - 06/15/16 0944    Clinical Impression Statement Pt reports she can now go to the grocery store and walk without her LE giving out as long as she holds onto the cart.  Strength of RTLE significantly improved since eval.    Rehab Potential Excellent   Clinical Impairments Affecting Rehab Potential severe arthritis Rt knee, history of abdominal surgery, history of DVT   PT Frequency 2x / week   PT Duration 8 weeks   PT Treatment/Interventions Cryotherapy;Electrical Stimulation;Moist Heat;Therapeutic activities;Therapeutic exercise;Balance training;Neuromuscular re-education;Patient/family education;Manual techniques;Biofeedback;Passive range of motion;Taping;Dry needling   PT Next Visit Plan To MD tomorrow   Consulted and Agree with Plan of Care Patient      Patient will benefit from skilled therapeutic intervention in order to improve the following deficits and impairments:  Abnormal gait, Decreased activity tolerance, Decreased balance, Decreased endurance, Decreased range of motion, Difficulty walking, Decreased strength, Increased muscle spasms, Pain  Visit Diagnosis: Other abnormalities of gait and mobility  Muscle weakness (generalized)     Problem List Patient Active Problem List   Diagnosis Date Noted  . Unilateral primary osteoarthritis, right knee 04/07/2016  . Acute gout of left foot 03/22/2016  . Pain in joint, lower leg 08/28/2014  . Trigeminal neuralgia   . Encounter for therapeutic drug monitoring 10/21/2013  . Syncope 12/29/2011  . Chronic low back pain 09/13/2010  . Allergic rhinitis due to pollen 06/25/2010  . COPD mixed type ([place]) 01/22/2010  . Solitary pulmonary nodule 01/12/2010  . Hyperglycemia 01/06/2009   . Osteopenia 01/06/2009  . ECZEMA 11/26/2007  . Peripheral arterial disease ([place]) 06/29/2007  . Hyperlipidemia 10/23/2006  . Essential hypertension 10/23/2006  . ESOPHAGEAL STRICTURE 08/09/1993    Jameis Newsham, PTA 06/15/2016, 10:15 AM  [facility] [address] [place], [name], [address] Phone: [phone]   Fax:  [phone]  Name: [name] [name] MRN: [id number] Date of Birth: 03-17-1925

## 2016-06-16 ENCOUNTER — Encounter: Payer: Self-pay | Admitting: Physical Medicine & Rehabilitation

## 2016-06-16 ENCOUNTER — Encounter: Payer: Medicare Other | Attending: Physical Medicine & Rehabilitation | Admitting: Physical Medicine & Rehabilitation

## 2016-06-16 VITALS — BP 124/66 | HR 72 | Resp 16

## 2016-06-16 DIAGNOSIS — Z86010 Personal history of colonic polyps: Secondary | ICD-10-CM | POA: Insufficient documentation

## 2016-06-16 DIAGNOSIS — M109 Gout, unspecified: Secondary | ICD-10-CM | POA: Diagnosis not present

## 2016-06-16 DIAGNOSIS — J449 Chronic obstructive pulmonary disease, unspecified: Secondary | ICD-10-CM | POA: Insufficient documentation

## 2016-06-16 DIAGNOSIS — Z87891 Personal history of nicotine dependence: Secondary | ICD-10-CM | POA: Insufficient documentation

## 2016-06-16 DIAGNOSIS — Z9851 Tubal ligation status: Secondary | ICD-10-CM | POA: Diagnosis not present

## 2016-06-16 DIAGNOSIS — M1711 Unilateral primary osteoarthritis, right knee: Secondary | ICD-10-CM | POA: Diagnosis not present

## 2016-06-16 DIAGNOSIS — Z803 Family history of malignant neoplasm of breast: Secondary | ICD-10-CM | POA: Insufficient documentation

## 2016-06-16 DIAGNOSIS — G8929 Other chronic pain: Secondary | ICD-10-CM | POA: Diagnosis not present

## 2016-06-16 DIAGNOSIS — I739 Peripheral vascular disease, unspecified: Secondary | ICD-10-CM | POA: Diagnosis not present

## 2016-06-16 DIAGNOSIS — M533 Sacrococcygeal disorders, not elsewhere classified: Secondary | ICD-10-CM | POA: Diagnosis not present

## 2016-06-16 DIAGNOSIS — Z981 Arthrodesis status: Secondary | ICD-10-CM | POA: Diagnosis not present

## 2016-06-16 DIAGNOSIS — Z9049 Acquired absence of other specified parts of digestive tract: Secondary | ICD-10-CM | POA: Diagnosis not present

## 2016-06-16 DIAGNOSIS — Z86718 Personal history of other venous thrombosis and embolism: Secondary | ICD-10-CM | POA: Insufficient documentation

## 2016-06-16 DIAGNOSIS — G479 Sleep disorder, unspecified: Secondary | ICD-10-CM

## 2016-06-16 DIAGNOSIS — Z800 Family history of malignant neoplasm of digestive organs: Secondary | ICD-10-CM | POA: Insufficient documentation

## 2016-06-16 DIAGNOSIS — I10 Essential (primary) hypertension: Secondary | ICD-10-CM | POA: Insufficient documentation

## 2016-06-16 DIAGNOSIS — K219 Gastro-esophageal reflux disease without esophagitis: Secondary | ICD-10-CM | POA: Insufficient documentation

## 2016-06-16 DIAGNOSIS — M961 Postlaminectomy syndrome, not elsewhere classified: Secondary | ICD-10-CM | POA: Diagnosis not present

## 2016-06-16 DIAGNOSIS — Z8249 Family history of ischemic heart disease and other diseases of the circulatory system: Secondary | ICD-10-CM | POA: Insufficient documentation

## 2016-06-16 DIAGNOSIS — G894 Chronic pain syndrome: Secondary | ICD-10-CM

## 2016-06-16 DIAGNOSIS — Z8371 Family history of colonic polyps: Secondary | ICD-10-CM | POA: Insufficient documentation

## 2016-06-16 DIAGNOSIS — Z5181 Encounter for therapeutic drug level monitoring: Secondary | ICD-10-CM | POA: Insufficient documentation

## 2016-06-16 DIAGNOSIS — E785 Hyperlipidemia, unspecified: Secondary | ICD-10-CM | POA: Insufficient documentation

## 2016-06-16 DIAGNOSIS — R269 Unspecified abnormalities of gait and mobility: Secondary | ICD-10-CM

## 2016-06-16 DIAGNOSIS — Z801 Family history of malignant neoplasm of trachea, bronchus and lung: Secondary | ICD-10-CM | POA: Insufficient documentation

## 2016-06-16 DIAGNOSIS — Z955 Presence of coronary angioplasty implant and graft: Secondary | ICD-10-CM | POA: Diagnosis not present

## 2016-06-16 DIAGNOSIS — G500 Trigeminal neuralgia: Secondary | ICD-10-CM | POA: Diagnosis not present

## 2016-06-16 DIAGNOSIS — M792 Neuralgia and neuritis, unspecified: Secondary | ICD-10-CM

## 2016-06-16 DIAGNOSIS — Z8261 Family history of arthritis: Secondary | ICD-10-CM | POA: Diagnosis not present

## 2016-06-16 MED ORDER — DULOXETINE HCL 60 MG PO CPEP: 60.0000 mg | ORAL_CAPSULE | Freq: Every day | ORAL | 1 refills | Status: DC

## 2016-06-16 MED ORDER — METHOCARBAMOL 500 MG PO TABS: 500.0000 mg | ORAL_TABLET | Freq: Two times a day (BID) | ORAL | 1 refills | Status: DC | PRN

## 2016-06-16 NOTE — Progress Notes (Signed)
Subjective:    Patient ID: [name] [name], female    DOB: 02-Jun-1924, 83 y.o.   MRN: [id number]  HPI 83 y/o female with pmhpsh of PAD, COPD, DVT, HTN, Gout, trigeminal neuralgia, OA of right knee with injections, lumbar fusion present for follow up of pain in her back > right leg pain.   Initially stated: Pt stated she has had back pain "all her life".  Getting progressively worse.  Movement improves the pain.  Standing and walking exacerbate the pain. Dull pain.  Non-radiating.  Intermittent.  Denies associated numbness, tingling, weakness.  Lidoderm patch help. Pain limits pt from doing things around the house.   Last clinic visit 05/05/16.  Since that time, she has tried heat and states it helps a lot.  She is limiting her tylenol.  She continues to use OTC Lidoderm, which helps some.  She has been going to PT and states it is "amazing".  She did get her xrays. She could not tolerate Gabapentin. She has tolerated the Cymbalta and Robaxin very well.  Overall, pt is doing very well.    Pain Inventory Average Pain 5 Pain Right Now 3 My pain is aching  In the last 24 hours, has pain interfered with the following? General activity 4 Relation with others 4 Enjoyment of life 4 What TIME of day is your pain at its worst? evening Sleep (in general) Fair  Pain is worse with: bending and standing Pain improves with: heat/ice and therapy/exercise Relief from Meds: n/a  Mobility walk without assistance how many minutes can you walk? 2-3 do you drive?  yes Do you have any goals in this area?  yes  Function retired I need assistance with the following:  household duties  Neuro/Psych bladder control problems trouble walking dizziness  Prior Studies Any changes since last visit?  no  Physicians involved in your care Any changes since last visit?  no   Family History  Problem Relation Age of Onset  . Arthritis Mother   . Hypertension Mother   . Heart disease Father   . Colon  polyps Father   . Breast cancer Sister   . Lung cancer Sister   . Colon cancer Sister   . Cancer Sister     Breast  . Cancer Daughter     Breast  . Hypertension Son   . Lung cancer Brother   . Cancer Brother     Lung  . Anesthesia problems Neg Hx   . Hypotension Neg Hx   . Malignant hyperthermia Neg Hx   . Pseudochol deficiency Neg Hx    Social History   Social History  . Marital status: Single    Spouse name: N/A  . Number of children: 4  . Years of education: 12   Occupational History  .  Retired   Social History Main Topics  . Smoking status: Former Smoker    Packs/day: 2.00    Years: 30.00    Types: Cigarettes    Quit date: 06/18/1993  . Smokeless tobacco: Never Used     Comment: QUIT IN 1995  . Alcohol use No  . Drug use: No  . Sexual activity: Not Asked   Other Topics Concern  . None   Social History Narrative   Patient is widowed with 4 children. 2 grandkids. Lives alone.    Patient is right handed.   Patient has high school education.   Patient drinks 1 cup daily.      Retired  from [employer] for 28 years, works 2 days a week until 2016.       Hobbies: volunteers at [employer], [place] from church visits people.          Past Surgical History:  Procedure Laterality Date  . ABDOMINAL AORTAGRAM N/A 06/17/2011   Procedure: ABDOMINAL Maxcine Ham;  Surgeon: Elam Dutch, MD;  Location: [facility] CATH LAB;  Service: Cardiovascular;  Laterality: N/A;  . CHOLECYSTECTOMY  1998  . EYE SURGERY     cataracts removed- bilateral /w IOL  . FEMORAL-POPLITEAL BYPASS GRAFT        x2 surgeries 1990's & 2009  . INJECTION KNEE Right Aug. 2016   Gel injection for pain  . LUMBAR FUSION  07/06/2011  . TONSILLECTOMY     as a teenager   . TUBAL LIGATION     Past Medical History:  Diagnosis Date  . Allergic rhinoconjunctivitis   . Anxiety    pt. managed- uses deep breathing   . Arthritis    low back , stenosis  . COLONIC POLYPS, RECURRENT 08/29/2006    2008 last colonoscopy. No further colonoscopy.     Marland Kitchen COPD (chronic obstructive pulmonary disease) ([place])   . Diverticulosis   . DVT (deep venous thrombosis) ([place])   . Eczema   . Environmental allergies    allergy shot- q friday in Dr. Janee Morn office. PFT's abnormal- recommended Spiriva to use preop & will d/c after surgery  . GERD (gastroesophageal reflux disease)   . H/O hiatal hernia   . History of colonic polyps   . Hyperlipidemia   . Hypertension   . Lung nodule 2011  . Peripheral vascular disease ([place])   . Shingles   . Stenosis of popliteal artery (HCC)    blood clots in legs long ago    . Trigeminal neuralgia    BP 124/66   Pulse 72   Resp 16   SpO2 94%   Opioid Risk Score:   Fall Risk Score:  `1  Depression screen PHQ 2/9  Depression screen [facility] 2/9 06/16/2016 05/05/2016 09/14/2015 08/31/2015 08/11/2015 03/07/2015 08/07/2014  Decreased Interest 0 0 0 0 0 0 0  Down, Depressed, Hopeless 0 0 0 0 0 0 0  PHQ - 2 Score 0 0 0 0 0 0 0  Altered sleeping - 1 - - - - -  Tired, decreased energy - 0 - - - - -  Change in appetite - 1 - - - - -  Feeling bad or failure about yourself  - 0 - - - - -  Trouble concentrating - 0 - - - - -  Moving slowly or fidgety/restless - 0 - - - - -  Suicidal thoughts - 0 - - - - -  PHQ-9 Score - 2 - - - - -  Some recent data might be hidden   Review of Systems  HENT: Negative.   Eyes: Negative.   Respiratory: Negative.   Cardiovascular: Negative.   Gastrointestinal: Negative.   Endocrine: Negative.        High blood pressure   Genitourinary: Negative.   Musculoskeletal: Positive for back pain and gait problem.  Skin: Positive for rash.  Allergic/Immunologic: Negative.   Hematological: Negative.   Psychiatric/Behavioral: Negative.   All other systems reviewed and are negative.     Objective:   Physical Exam Gen: NAD. Vital signs reviewed HENT: Normocephalic, Atraumatic Eyes: EOMI. No discharge. Cardio: RRR. No JVD. Pulm: B/l clear to  auscultation.  Effort normal Abd: Soft, BS+ MSK:  Gait WNL.   TTP along lumbosacral PSPs, gluteal muscles R>L,  Gait: slow cadence Neuro:   Sensation intact to light touch in all LE dermatomes  Strength  4+/5 proximally, 5/5 distally (pain inhibition, left stronger than right)  Neg SLR b/l Skin: Open blisters b/l hands.    Assessment & Plan:  83 y/o female with pmhpsh of PAD, COPD, DVT, HTN, Gout, trigeminal neuralgia, OA of right knee with injections, lumbar fusion present for follow up of pain in her back > right leg pain.    1. Chronic low back pain with failed back syndrome   Will avoid NSAIDs due to coumadin  Unable to tolerate Gabapentin  Cont heat  Cont tylenol, limit to 2000mg /day  Cont Lidoderm patches (occasionally uses)  Cont PT, pt with great benefit  Will consider TENS unit, as pt is having good benefit with PT.  Xray of L-spine and right SI joint reviewed, SI joint unremarkable, degenerative changes in spine with right scoliosis.    Will increase Cymbalta to 60 mg  Cont Robaxin 500 BID PRN  2. Possible Sacroiliitis on right  Xray unremakrable  Will consider bracing in future  3. Sleep disturbance  Due to pain  Cont melatonin  4. Neuropathic pain  See #1  5. Gait abnormality  Does not want a cane at present  6. Myalgia  Will consider trigger point injections in future

## 2016-06-16 NOTE — Patient Instructions (Signed)
Please follow up regarding TENS unit

## 2016-06-20 ENCOUNTER — Encounter: Payer: Self-pay | Admitting: Physical Therapy

## 2016-06-20 ENCOUNTER — Ambulatory Visit: Payer: Medicare Other | Admitting: Physical Therapy

## 2016-06-20 DIAGNOSIS — R2689 Other abnormalities of gait and mobility: Secondary | ICD-10-CM

## 2016-06-20 DIAGNOSIS — M6281 Muscle weakness (generalized): Secondary | ICD-10-CM | POA: Diagnosis not present

## 2016-06-20 NOTE — Patient Instructions (Signed)
TENS unit:  Where to purchase: 1. [facility]  2. [email or web address] In the search box you would type in TENS unit.   3. Bring your unit with you to the next therapy session and we will show you how to use it.,

## 2016-06-20 NOTE — Therapy (Signed)
[facility] [address], [place], [name], [address] Phone: [phone]   Fax:  [phone]  Physical Therapy Treatment  Patient Details  Name: [name] [name] MRN: [id number] Date of Birth: 1925-04-23 Referring Provider: Jamse Arn, MD  Encounter Date: 06/20/2016      PT End of Session - 06/20/16 0932    Visit Number 11   Number of Visits 19   Date for PT Re-Evaluation Jul 17, 2016   Authorization Type g codes at 10th visit   PT Start Time 0931   PT Stop Time 1030   PT Time Calculation (min) 59 min   Activity Tolerance Patient tolerated treatment well   Behavior During Therapy [facility] for tasks assessed/performed      Past Medical History:  Diagnosis Date  . Allergic rhinoconjunctivitis   . Anxiety    pt. managed- uses deep breathing   . Arthritis    low back , stenosis  . COLONIC POLYPS, RECURRENT 08/29/2006   2008 last colonoscopy. No further colonoscopy.     Marland Kitchen COPD (chronic obstructive pulmonary disease) ([place])   . Diverticulosis   . DVT (deep venous thrombosis) ([place])   . Eczema   . Environmental allergies    allergy shot- q friday in Dr. Janee Morn office. PFT's abnormal- recommended Spiriva to use preop & will d/c after surgery  . GERD (gastroesophageal reflux disease)   . H/O hiatal hernia   . History of colonic polyps   . Hyperlipidemia   . Hypertension   . Lung nodule 2011  . Peripheral vascular disease ([place])   . Shingles   . Stenosis of popliteal artery (HCC)    blood clots in legs long ago    . Trigeminal neuralgia     Past Surgical History:  Procedure Laterality Date  . ABDOMINAL AORTAGRAM N/A 06/17/2011   Procedure: ABDOMINAL Maxcine Ham;  Surgeon: Elam Dutch, MD;  Location: [facility] CATH LAB;  Service: Cardiovascular;  Laterality: N/A;  . CHOLECYSTECTOMY  1998  . EYE SURGERY     cataracts removed- bilateral /w IOL  . FEMORAL-POPLITEAL BYPASS GRAFT        x2 surgeries 1990's & 2009  .  INJECTION KNEE Right Aug. 2016   Gel injection for pain  . LUMBAR FUSION  07/06/2011  . TONSILLECTOMY     as a teenager   . TUBAL LIGATION      There were no vitals filed for this visit.      Subjective Assessment - 06/20/16 0943    Subjective MD was pleased with pt's progress. Suggested TENS unit for home.    Currently in Pain? No/denies   Multiple Pain Sites No                         [facility] Adult PT Treatment/Exercise - 06/20/16 0001      Lumbar Exercises: Stretches   Piriformis Stretch Limitations Seated ankle DF/PF on rocker board x 3 min  Concurrent review of MD appt/and goals     Lumbar Exercises: Aerobic   Stationary Bike Nustep L2 x 10 min  [name] [name] present     Lumbar Exercises: Standing   Other Standing Lumbar Exercises Weight shifting on mini tramp 3 ways 1 min     Lumbar Exercises: Seated   Long Arc Quad on Chair Strengthening;Both;1 set;10 reps;Weights   LAQ on Chair Weights (lbs) 1   LAQ on Chair Limitations Red band clamshells 2x 10   Sit  to Stand 5 reps  No UE     Moist Heat Therapy   Number Minutes Moist Heat 15 Minutes   Moist Heat Location Lumbar Spine     Electrical Stimulation   Electrical Stimulation Location bil. low back   Electrical Stimulation Action IFC   Electrical Stimulation Parameters 15 min in semireclined sidelying   Electrical Stimulation Goals Pain                  PT Short Term Goals - 06/15/16 0936      PT SHORT TERM GOAL #1   Title independent with initial HEP   Time 4   Period Weeks   Status Achieved     PT SHORT TERM GOAL #2   Title improved upright posture in standing due to increased hip extension and increased glute strength   Time 4   Period Weeks   Status Achieved     PT SHORT TERM GOAL #3   Title able to stand for 20 minutes due to increased strength and endurance   Time 4   Period Weeks   Status Achieved           PT Long Term Goals - 06/15/16 0936      PT LONG TERM GOAL #1    Title independent with advanced HEP   Time 8   Period Weeks   Status On-going     PT LONG TERM GOAL #4   Title Pt will be able to do grocery shopping without feeling like her legs wanting to give out   Time 8   Period Weeks   Status Partially Met  Needs cart for support               Plan - 06/20/16 0933    Clinical Impression Statement Pt having a really good day despite the rain today. She was able to do all her HEP this AM, made her bed and had breakfast with very little pain. Added some light resistive work today which pt tolerated very well.     Rehab Potential Excellent   Clinical Impairments Affecting Rehab Potential severe arthritis Rt knee, history of abdominal surgery, history of DVT   PT Frequency 2x / week   PT Duration 8 weeks   PT Treatment/Interventions Cryotherapy;Electrical Stimulation;Moist Heat;Therapeutic activities;Therapeutic exercise;Balance training;Neuromuscular re-education;Patient/family education;Manual techniques;Biofeedback;Passive range of motion;Taping;Dry needling;Canalith Repostioning   PT Next Visit Plan Sit to stand, add set to LAQ and give band for seated clamshells if pt can get on independently   Consulted and Agree with Plan of Care Patient      Patient will benefit from skilled therapeutic intervention in order to improve the following deficits and impairments:  Abnormal gait, Decreased activity tolerance, Decreased balance, Decreased endurance, Decreased range of motion, Difficulty walking, Decreased strength, Increased muscle spasms, Pain  Visit Diagnosis: Other abnormalities of gait and mobility  Muscle weakness (generalized)     Problem List Patient Active Problem List   Diagnosis Date Noted  . Unilateral primary osteoarthritis, right knee 04/07/2016  . Acute gout of left foot 03/22/2016  . Pain in joint, lower leg 08/28/2014  . Trigeminal neuralgia   . Encounter for therapeutic drug monitoring 10/21/2013  . Syncope  12/29/2011  . Chronic low back pain 09/13/2010  . Allergic rhinitis due to pollen 06/25/2010  . COPD mixed type ([place]) 01/22/2010  . Solitary pulmonary nodule 01/12/2010  . Hyperglycemia 01/06/2009  . Osteopenia 01/06/2009  . ECZEMA 11/26/2007  . Peripheral arterial disease (  [place]) 06/29/2007  . Hyperlipidemia 10/23/2006  . Essential hypertension 10/23/2006  . ESOPHAGEAL STRICTURE 08/09/1993    [name] [name], [name] [name] 06/20/2016, 10:18 AM  [facility] [address], [place], [name], [address] Phone: [phone]   Fax:  [phone]  Name: [name] [name] MRN: [id number] Date of Birth: January 14, 1925

## 2016-06-22 ENCOUNTER — Ambulatory Visit: Payer: Medicare Other | Admitting: Physical Therapy

## 2016-06-22 ENCOUNTER — Encounter: Payer: Self-pay | Admitting: Family Medicine

## 2016-06-22 ENCOUNTER — Ambulatory Visit (INDEPENDENT_AMBULATORY_CARE_PROVIDER_SITE_OTHER): Payer: Medicare Other | Admitting: Family Medicine

## 2016-06-22 ENCOUNTER — Encounter: Payer: Self-pay | Admitting: Physical Therapy

## 2016-06-22 DIAGNOSIS — M6281 Muscle weakness (generalized): Secondary | ICD-10-CM

## 2016-06-22 DIAGNOSIS — L309 Dermatitis, unspecified: Secondary | ICD-10-CM | POA: Diagnosis not present

## 2016-06-22 DIAGNOSIS — I10 Essential (primary) hypertension: Secondary | ICD-10-CM

## 2016-06-22 DIAGNOSIS — R2689 Other abnormalities of gait and mobility: Secondary | ICD-10-CM

## 2016-06-22 DIAGNOSIS — M109 Gout, unspecified: Secondary | ICD-10-CM

## 2016-06-22 DIAGNOSIS — R739 Hyperglycemia, unspecified: Secondary | ICD-10-CM

## 2016-06-22 DIAGNOSIS — G8929 Other chronic pain: Secondary | ICD-10-CM

## 2016-06-22 DIAGNOSIS — M5441 Lumbago with sciatica, right side: Secondary | ICD-10-CM

## 2016-06-22 LAB — COMPREHENSIVE METABOLIC PANEL
ALT: 10 U/L (ref 0–35)
AST: 13 U/L (ref 0–37)
Albumin: 4.5 g/dL (ref 3.5–5.2)
Alkaline Phosphatase: 58 U/L (ref 39–117)
BILIRUBIN TOTAL: 0.4 mg/dL (ref 0.2–1.2)
BUN: 21 mg/dL (ref 6–23)
CHLORIDE: 103 meq/L (ref 96–112)
CO2: 28 meq/L (ref 19–32)
Calcium: 9.8 mg/dL (ref 8.4–10.5)
Creatinine, Ser: 1 mg/dL (ref 0.40–1.20)
GFR: 55.41 mL/min — AB (ref >60.00)
GLUCOSE: 124 mg/dL — AB (ref 70–99)
POTASSIUM: 5 meq/L (ref 3.5–5.1)
Sodium: 137 mEq/L (ref 135–145)
Total Protein: 6.8 g/dL (ref 6.0–8.3)

## 2016-06-22 LAB — HEMOGLOBIN A1C: HEMOGLOBIN A1C: 6.2 % (ref 4.6–6.5)

## 2016-06-22 LAB — LDL CHOLESTEROL, DIRECT: Direct LDL: 58 mg/dL

## 2016-06-22 LAB — CBC
HCT: 39.8 % (ref 36.0–46.0)
HEMOGLOBIN: 13.3 g/dL (ref 12.0–15.0)
MCHC: 33.5 g/dL (ref 30.0–36.0)
MCV: 87.6 fl (ref 78.0–100.0)
Platelets: 211 10*3/uL (ref 150.0–400.0)
RBC: 4.54 Mil/uL (ref 3.87–5.11)
RDW: 13.3 % (ref 11.5–15.5)
WBC: 9.2 10*3/uL (ref 4.0–10.5)

## 2016-06-22 LAB — POC URINALSYSI DIPSTICK (AUTOMATED)
Bilirubin, UA: NEGATIVE
Blood, UA: NEGATIVE
GLUCOSE UA: NEGATIVE
Nitrite, UA: NEGATIVE
Protein, UA: NEGATIVE
Spec Grav, UA: 1.02
UROBILINOGEN UA: 0.2
pH, UA: 5

## 2016-06-22 LAB — URIC ACID: Uric Acid, Serum: 7.2 mg/dL — ABNORMAL HIGH (ref 2.4–7.0)

## 2016-06-22 NOTE — Assessment & Plan Note (Signed)
Pain management refer. Seeing Dr. Posey Pronto. Increased cymbalta. TENS unit. Continue robaxin. Going to PT. She is THRILLEd wit her results- so grateful to Dr. Posey Pronto and PT. She states she in fact is pain free this AM. With being more active has lost 3 lbs even!

## 2016-06-22 NOTE — Assessment & Plan Note (Signed)
S: blurry vision on metformin so stopped.  Lab Results  Component Value Date   HGBA1C 6.3 09/10/2015  A/P: will update a1c. Activity as allows, plant based diet- has lost 3 lbs! From being more active with PT likely helping

## 2016-06-22 NOTE — Progress Notes (Signed)
Pre visit review using our clinic review tool, if applicable. No additional management support is needed unless otherwise documented below in the visit note. 

## 2016-06-22 NOTE — Assessment & Plan Note (Signed)
S: on hctz increasing risk. No flares. Not on colchicine A/P: update uric acid but thrilled no flares. Will continue hctz as helping bp

## 2016-06-22 NOTE — Assessment & Plan Note (Signed)
S: controlled on valsartan hctz 320-25mg .  BP Readings from Last 3 Encounters:  06/22/16 (!) 136/58  06/16/16 124/66  05/05/16 122/74  A/P:Continue current meds:  Looks great. hctz gout risk noted.

## 2016-06-22 NOTE — Patient Instructions (Signed)
Please stop by lab before you go  Glad you are doing so well

## 2016-06-22 NOTE — Assessment & Plan Note (Signed)
dyshidrotic Eczema- Dr. Allyson Sabal in past but multiple meds didn't help. Lye soap helped. Dr. Merryl Hacker [place] derm for most recent episode. Blisters calluses- improved was told to continue and return if not improving. She is happy with progress nad will continue her lye soap therpay

## 2016-06-22 NOTE — Progress Notes (Signed)
Subjective:  SERENIDY AMABILE is a 83 y.o. year old very pleasant female patient who presents for/with See problem oriented charting ROS- No chest pain or worsening shortness of breath (actually feels better). No headache or blurry vision. Low back pain not present today   Past Medical History-  Patient Active Problem List   Diagnosis Date Noted  . Pain in joint, lower leg 08/28/2014    Priority: High  . Peripheral arterial disease ([place]) 06/29/2007    Priority: High  . Acute gout of left foot 03/22/2016    Priority: Medium  . Trigeminal neuralgia     Priority: Medium  . Chronic low back pain 09/13/2010    Priority: Medium  . COPD mixed type ([place]) 01/22/2010    Priority: Medium  . Hyperglycemia 01/06/2009    Priority: Medium  . Hyperlipidemia 10/23/2006    Priority: Medium  . Essential hypertension 10/23/2006    Priority: Medium  . ESOPHAGEAL STRICTURE 08/09/1993    Priority: Medium  . Encounter for therapeutic drug monitoring 10/21/2013    Priority: Low  . Syncope 12/29/2011    Priority: Low  . Allergic rhinitis due to pollen 06/25/2010    Priority: Low  . Solitary pulmonary nodule 01/12/2010    Priority: Low  . Osteopenia 01/06/2009    Priority: Low  . Eczema 11/26/2007    Priority: Low  . Unilateral primary osteoarthritis, right knee 04/07/2016    Medications- reviewed and updated Current Outpatient Prescriptions  Medication Sig Dispense Refill  . acetaminophen (TYLENOL) 500 MG tablet Take 500-1,500 mg by mouth 3 (three) times daily as needed for moderate pain. Reported on 08/31/2015    . aspirin EC 81 MG tablet Take 81 mg by mouth daily.    . calcium-vitamin D (OSCAL WITH D 500-200) 500-200 MG-UNIT per tablet Take 1 tablet by mouth daily.      . Cholecalciferol (VITAMIN D3) 1000 UNITS CAPS Take 1,000 Units by mouth daily.     . colchicine (COLCRYS) 0.6 MG tablet Take 2 pills at first sign gout flare, then another pill 2 hours later if still having pain. Then take  once daily until pain resolves. 30 tablet 0  . DULoxetine (CYMBALTA) 60 MG capsule Take 1 capsule (60 mg total) by mouth daily. 30 capsule 1  . glucosamine-chondroitin 500-400 MG tablet Take 1 tablet by mouth 2 (two) times daily.     Marland Kitchen glucose blood test strip Test one time a day 100 each 12  . Lancets (ONETOUCH ULTRASOFT) lancets Test one time a day 100 each 12  . lidocaine (LIDODERM) 5 % Use every other day as needed. Remove & Discard patch within 12 hours or as directed by MD 30 patch 0  . methocarbamol (ROBAXIN) 500 MG tablet Take 1 tablet (500 mg total) by mouth 2 (two) times daily as needed for muscle spasms. 60 tablet 1  . omeprazole (PRILOSEC) 20 MG capsule Take 20 mg by mouth every morning.     . simvastatin (ZOCOR) 40 MG tablet TAKE ONE TABLET BY MOUTH ONCE DAILY AT  6PM 90 tablet 2  . valsartan-hydrochlorothiazide (DIOVAN-HCT) 320-25 MG tablet TAKE ONE TABLET BY MOUTH ONCE DAILY 90 tablet 1  . warfarin (COUMADIN) 5 MG tablet TAKE 2 & 1/2 TABLETS ON THURSDAY THEN TAKE 2 TABLETS THE REST OF THE DAYS (Patient taking differently: TAKE 1 tablet per day) 180 tablet 1   No current facility-administered medications for this visit.     Objective: BP (!) 136/58 (BP Location: Left  Arm, Patient Position: Sitting, Cuff Size: Large)   Pulse 66   Temp 97.9 F (36.6 C) (Oral)   Ht 5' (1.524 m)   Wt 176 lb (79.8 kg)   SpO2 95%   BMI 34.37 kg/m  Gen: NAD, resting comfortably, appears younger than stated age CV: RRR no murmurs rubs or gallops Lungs: CTAB no crackles, wheeze, rhonchi Abdomen: soft/nontender/nondistended/normal bowel sounds. No rebound or guarding.  Ext: no edema Skin: warm, dry Neuro: walks without assistive devices  Assessment/Plan:  Hyperglycemia S: blurry vision on metformin so stopped.  Lab Results  Component Value Date   HGBA1C 6.3 09/10/2015  A/P: will update a1c. Activity as allows, plant based diet- has lost 3 lbs! From being more active with PT likely  helping  Acute gout of left foot S: on hctz increasing risk. No flares. Not on colchicine A/P: update uric acid but thrilled no flares. Will continue hctz as helping bp  Essential hypertension S: controlled on valsartan hctz 320-25mg .  BP Readings from Last 3 Encounters:  06/22/16 (!) 136/58  06/16/16 124/66  05/05/16 122/74  A/P:Continue current meds:  Looks great. hctz gout risk noted.   Chronic low back pain Pain management refer. Seeing Dr. Posey Pronto. Increased cymbalta. TENS unit. Continue robaxin. Going to PT. She is THRILLEd wit her results- so grateful to Dr. Posey Pronto and PT. She states she in fact is pain free this AM. With being more active has lost 3 lbs even!   Eczema dyshidrotic Eczema- Dr. Allyson Sabal in past but multiple meds didn't help. Lye soap helped. Dr. Merryl Hacker [place] derm for most recent episode. Blisters calluses- improved was told to continue and return if not improving. She is happy with progress nad will continue her lye soap therpay  3 month  Orders Placed This Encounter  Procedures  . Uric Acid  . LDL cholesterol, direct    [place]  . Hemoglobin A1c    [place]  . CBC    [place]  . Comprehensive metabolic panel      . POCT Urinalysis Dipstick (Automated)   Return precautions advised.  [name], MD

## 2016-06-22 NOTE — Therapy (Signed)
[facility] [address] [place], [name], [address] Phone: [phone]   Fax:  [phone]  Physical Therapy Treatment  Patient Details  Name: [name] [name] MRN: [id number] Date of Birth: 04-15-1925 Referring Provider: Jamse Arn, MD  Encounter Date: 06/22/2016      PT End of Session - 06/22/16 1105    Visit Number 12   Number of Visits 19   Date for PT Re-Evaluation 29-Jul-2016   Authorization Type g codes at 9th visit   PT Start Time 1101   PT Stop Time 1140   PT Time Calculation (min) 39 min   Activity Tolerance Patient tolerated treatment well   Behavior During Therapy [name] for tasks assessed/performed      Past Medical History:  Diagnosis Date  . Allergic rhinoconjunctivitis   . Anxiety    pt. managed- uses deep breathing   . Arthritis    low back , stenosis  . COLONIC POLYPS, RECURRENT 08/29/2006   2008 last colonoscopy. No further colonoscopy.     Marland Kitchen COPD (chronic obstructive pulmonary disease) ([place])   . Diverticulosis   . DVT (deep venous thrombosis) ([place])   . Eczema   . Environmental allergies    allergy shot- q friday in Dr. Janee Morn office. PFT's abnormal- recommended Spiriva to use preop & will d/c after surgery  . GERD (gastroesophageal reflux disease)   . H/O hiatal hernia   . History of colonic polyps   . Hyperlipidemia   . Hypertension   . Lung nodule 2011  . Peripheral vascular disease ([place])   . Shingles   . Stenosis of popliteal artery (HCC)    blood clots in legs long ago    . Trigeminal neuralgia     Past Surgical History:  Procedure Laterality Date  . ABDOMINAL AORTAGRAM N/A 06/17/2011   Procedure: ABDOMINAL Maxcine Ham;  Surgeon: Elam Dutch, MD;  Location: [facility] CATH LAB;  Service: Cardiovascular;  Laterality: N/A;  . CHOLECYSTECTOMY  1998  . EYE SURGERY     cataracts removed- bilateral /w IOL  . FEMORAL-POPLITEAL BYPASS GRAFT        x2 surgeries 1990's & 2009  .  INJECTION KNEE Right Aug. 2016   Gel injection for pain  . LUMBAR FUSION  07/06/2011  . TONSILLECTOMY     as a teenager   . TUBAL LIGATION      There were no vitals filed for this visit.      Subjective Assessment - 06/22/16 1104    Subjective Pt bought a TENS unit with her today and would like to know how to use it.    Currently in Pain? No/denies   Multiple Pain Sites No                         [facility] Adult PT Treatment/Exercise - 06/22/16 0001      Self-Care   Self-Care Other Self-Care Comments   Other Self-Care Comments  TENS unit instruction     Lumbar Exercises: Aerobic   Stationary Bike Nustep L2 x 10 min  PTA present     Electrical Stimulation   Electrical Stimulation Location bil. low back   Electrical Stimulation Action IFC   Electrical Stimulation Parameters During instruction   Electrical Stimulation Goals Pain                  PT Short Term Goals - 06/15/16 2297      PT  SHORT TERM GOAL #1   Title independent with initial HEP   Time 4   Period Weeks   Status Achieved     PT SHORT TERM GOAL #2   Title improved upright posture in standing due to increased hip extension and increased glute strength   Time 4   Period Weeks   Status Achieved     PT SHORT TERM GOAL #3   Title able to stand for 20 minutes due to increased strength and endurance   Time 4   Period Weeks   Status Achieved           PT Long Term Goals - 06/15/16 0936      PT LONG TERM GOAL #1   Title independent with advanced HEP   Time 8   Period Weeks   Status On-going     PT LONG TERM GOAL #4   Title Pt will be able to do grocery shopping without feeling like her legs wanting to give out   Time 8   Period Weeks   Status Partially Met  Needs cart for support               Plan - 06/22/16 1106    Clinical Impression Statement Pt purchased a home TENS unit and wanted instruction in how to use it today. She continues to do well with litttle  to no pain with her ADLs and especcially early in the AM.    Rehab Potential Excellent   Clinical Impairments Affecting Rehab Potential severe arthritis Rt knee, history of abdominal surgery, history of DVT   PT Frequency 2x / week   PT Duration 8 weeks   PT Treatment/Interventions Cryotherapy;Electrical Stimulation;Moist Heat;Therapeutic activities;Therapeutic exercise;Balance training;Neuromuscular re-education;Patient/family education;Manual techniques;Biofeedback;Passive range of motion;Taping;Dry needling;Canalith Repostioning   PT Next Visit Plan Sit to stand, add set to LAQ and give band for seated clamshells if pt can get on independently   Consulted and Agree with Plan of Care Patient      Patient will benefit from skilled therapeutic intervention in order to improve the following deficits and impairments:  Abnormal gait, Decreased activity tolerance, Decreased balance, Decreased endurance, Decreased range of motion, Difficulty walking, Decreased strength, Increased muscle spasms, Pain  Visit Diagnosis: Other abnormalities of gait and mobility  Muscle weakness (generalized)     Problem List Patient Active Problem List   Diagnosis Date Noted  . Unilateral primary osteoarthritis, right knee 04/07/2016  . Acute gout of left foot 03/22/2016  . Pain in joint, lower leg 08/28/2014  . Trigeminal neuralgia   . Encounter for therapeutic drug monitoring 10/21/2013  . Syncope 12/29/2011  . Chronic low back pain 09/13/2010  . Allergic rhinitis due to pollen 06/25/2010  . COPD mixed type ([place]) 01/22/2010  . Solitary pulmonary nodule 01/12/2010  . Hyperglycemia 01/06/2009  . Osteopenia 01/06/2009  . Eczema 11/26/2007  . Peripheral arterial disease ([place]) 06/29/2007  . Hyperlipidemia 10/23/2006  . Essential hypertension 10/23/2006  . ESOPHAGEAL STRICTURE 08/09/1993    COCHRAN,JENNIFER, PTA 06/22/2016, 11:47 AM  [facility] [address], [place], [name], [address] Phone: [phone]   Fax:  [phone]  Name: [name] [name] MRN: [id number] Date of Birth: 1924/06/28

## 2016-06-24 ENCOUNTER — Ambulatory Visit (INDEPENDENT_AMBULATORY_CARE_PROVIDER_SITE_OTHER): Payer: Medicare Other | Admitting: General Practice

## 2016-06-24 DIAGNOSIS — I749 Embolism and thrombosis of unspecified artery: Secondary | ICD-10-CM

## 2016-06-24 DIAGNOSIS — Z5181 Encounter for therapeutic drug level monitoring: Secondary | ICD-10-CM | POA: Diagnosis not present

## 2016-06-24 LAB — POCT INR: INR: 1.9

## 2016-06-24 NOTE — Patient Instructions (Signed)
Pre visit review using our clinic review tool, if applicable. No additional management support is needed unless otherwise documented below in the visit note. 

## 2016-06-24 NOTE — Progress Notes (Signed)
I have reviewed and agree with the plan. 

## 2016-06-27 ENCOUNTER — Ambulatory Visit: Payer: Medicare Other | Attending: Physical Medicine & Rehabilitation | Admitting: Physical Therapy

## 2016-06-27 ENCOUNTER — Encounter: Payer: Self-pay | Admitting: Physical Therapy

## 2016-06-27 DIAGNOSIS — M6281 Muscle weakness (generalized): Secondary | ICD-10-CM | POA: Diagnosis not present

## 2016-06-27 DIAGNOSIS — R2689 Other abnormalities of gait and mobility: Secondary | ICD-10-CM | POA: Insufficient documentation

## 2016-06-27 DIAGNOSIS — L301 Dyshidrosis [pompholyx]: Secondary | ICD-10-CM | POA: Diagnosis not present

## 2016-06-27 NOTE — Therapy (Signed)
[facility] [address] [place], [name], [address] Phone: [phone]   Fax:  [phone]  Physical Therapy Treatment  Patient Details  Name: [name] [name] MRN: [id number] Date of Birth: [birth year] Referring Provider: Jamse Arn, MD  Encounter Date: 06/27/2016      PT End of Session - 06/27/16 1015    Visit Number 13   Number of Visits 19   Date for PT Re-Evaluation Aug 07, 2016   Authorization Type g codes at 07/10/2022 visit   PT Start Time 1015   PT Stop Time 1056   PT Time Calculation (min) 41 min   Activity Tolerance Patient tolerated treatment well   Behavior During Therapy [facility] for tasks assessed/performed      Past Medical History:  Diagnosis Date  . Allergic rhinoconjunctivitis   . Anxiety    pt. managed- uses deep breathing   . Arthritis    low back , stenosis  . COLONIC POLYPS, RECURRENT 08/29/2006   2008 last colonoscopy. No further colonoscopy.     Marland Kitchen COPD (chronic obstructive pulmonary disease) ([place])   . Diverticulosis   . DVT (deep venous thrombosis) ([place])   . Eczema   . Environmental allergies    allergy shot- q friday in Dr. Janee Morn office. PFT's abnormal- recommended Spiriva to use preop & will d/c after surgery  . GERD (gastroesophageal reflux disease)   . H/O hiatal hernia   . History of colonic polyps   . Hyperlipidemia   . Hypertension   . Lung nodule 2011  . Peripheral vascular disease ([place])   . Shingles   . Stenosis of popliteal artery (HCC)    blood clots in legs long ago    . Trigeminal neuralgia     Past Surgical History:  Procedure Laterality Date  . ABDOMINAL AORTAGRAM N/A 06/17/2011   Procedure: ABDOMINAL Maxcine Ham;  Surgeon: Elam Dutch, MD;  Location[facility] CATH LAB;  Service: Cardiovascular;  Laterality: N/A;  . CHOLECYSTECTOMY  1998  . EYE SURGERY     cataracts removed- bilateral /w IOL  . FEMORAL-POPLITEAL BYPASS GRAFT        x2 surgeries 1990's & 2009  .  INJECTION KNEE Right Aug. 2016   Gel injection for pain  . LUMBAR FUSION  07/06/2011  . TONSILLECTOMY     as a teenager   . TUBAL LIGATION      There were no vitals filed for this visit.      Subjective Assessment - 06/27/16 1017    Subjective States she almost couldn't get out of the church yesterday because hard to walk long distances.  States she is feeling a little better.   Pertinent History severe arthritis Rt knee, history of abdominal surgery, history of DVT   Limitations Standing;Walking   Currently in Pain? No/denies                         [facility] Adult PT Treatment/Exercise - 06/27/16 0001      Neuro Re-ed    Neuro Re-ed Details  incorporated breathing into ex     Lumbar Exercises: Aerobic   Stationary Bike Nustep L2 x 10 min  PTA present     Lumbar Exercises: Standing   Other Standing Lumbar Exercises Weight shifting on mini tramp 3 ways 1 min     Lumbar Exercises: Seated   Long Arc Quad on Chair Strengthening;Both;1 set;Weights;20 reps   LAQ on Chair Weights (lbs) 1.5#  LAQ on Chair Limitations Red band clamshells 2x 10   Sit to Stand 10 reps  No UE, 2 sets, exhale for contracting when standing     Lumbar Exercises: Supine   Clam 20 reps  red band   Bridge 20 reps  small lift with exhale   Straight Leg Raise 10 reps   Other Supine Lumbar Exercises ball squeeze on exhale - 20x                  PT Short Term Goals - 06/15/16 0936      PT SHORT TERM GOAL #1   Title independent with initial HEP   Time 4   Period Weeks   Status Achieved     PT SHORT TERM GOAL #2   Title improved upright posture in standing due to increased hip extension and increased glute strength   Time 4   Period Weeks   Status Achieved     PT SHORT TERM GOAL #3   Title able to stand for 20 minutes due to increased strength and endurance   Time 4   Period Weeks   Status Achieved           PT Long Term Goals - 06/27/16 1108      PT LONG  TERM GOAL #1   Title independent with advanced HEP   Time 8   Period Weeks   Status On-going     PT LONG TERM GOAL #2   Title FOTO < or = to 47%   Time 8   Period Weeks   Status On-going     PT LONG TERM GOAL #3   Title Pt will be able to stand for at least 30 minutes in order to prepare meal   Time 8   Period Weeks   Status On-going     PT LONG TERM GOAL #4   Title Pt will be able to do grocery shopping without feeling like her legs wanting to give out   Time 8   Period Weeks   Status Partially Met     PT LONG TERM GOAL #5   Title Improve TUG to <14sec to demonstrate decreased risk of falls   Time 8   Period Weeks   Status On-going               Plan - 06/27/16 1056    Clinical Impression Statement Able to increase sit to stand and did well incorporating breath during exercises for improved core stability without holding breath.  Discussed possibility of rollator for using in order to get patient to be able to take longer walks so she can take sitting breaks.  Patient unsure if she wants this due to not wanting to rely on it.  Patient continues to have pain with walking long distances and skilled PT for increase strength and endurance during functional activiites.   Clinical Impairments Affecting Rehab Potential severe arthritis Rt knee, history of abdominal surgery, history of DVT   PT Treatment/Interventions Cryotherapy;Electrical Stimulation;Moist Heat;Therapeutic activities;Therapeutic exercise;Balance training;Neuromuscular re-education;Patient/family education;Manual techniques;Biofeedback;Passive range of motion;Taping;Dry needling;Canalith Repostioning   PT Next Visit Plan review band for clamshell, progress LE and core strengthening as tolerated   Consulted and Agree with Plan of Care Patient      Patient will benefit from skilled therapeutic intervention in order to improve the following deficits and impairments:  Abnormal gait, Decreased activity tolerance,  Decreased balance, Decreased endurance, Decreased range of motion, Difficulty walking, Decreased strength, Increased muscle  spasms, Pain  Visit Diagnosis: Other abnormalities of gait and mobility  Muscle weakness (generalized)     Problem List Patient Active Problem List   Diagnosis Date Noted  . Unilateral primary osteoarthritis, right knee 04/07/2016  . Acute gout of left foot 03/22/2016  . Pain in joint, lower leg 08/28/2014  . Trigeminal neuralgia   . Encounter for therapeutic drug monitoring 10/21/2013  . Syncope 12/29/2011  . Chronic low back pain 09/13/2010  . Allergic rhinitis due to pollen 06/25/2010  . COPD mixed type ([place]) 01/22/2010  . Solitary pulmonary nodule 01/12/2010  . Hyperglycemia 01/06/2009  . Osteopenia 01/06/2009  . Eczema 11/26/2007  . Peripheral arterial disease ([place]) 06/29/2007  . Hyperlipidemia 10/23/2006  . Essential hypertension 10/23/2006  . ESOPHAGEAL STRICTURE 08/09/1993    Zannie Cove, PT 06/27/2016, 11:36 AM  [facility] [address] [place], [name], [address] Phone: [phone]   Fax:  [phone]  Name: [name] [name] MRN: [id number] Date of Birth: 28-Mar-1925

## 2016-06-29 ENCOUNTER — Ambulatory Visit: Payer: Medicare Other | Admitting: Physical Therapy

## 2016-06-29 ENCOUNTER — Encounter: Payer: Self-pay | Admitting: Physical Therapy

## 2016-06-29 DIAGNOSIS — R2689 Other abnormalities of gait and mobility: Secondary | ICD-10-CM

## 2016-06-29 DIAGNOSIS — M6281 Muscle weakness (generalized): Secondary | ICD-10-CM | POA: Diagnosis not present

## 2016-06-29 NOTE — Therapy (Signed)
[facility] [address] [place], [name], [address] Phone: [phone]   Fax:  [phone]  Physical Therapy Treatment  Patient Details  Name: [name] [name] MRN: [id number] Date of Birth: [birth year] Referring Provider: Jamse Arn, MD  Encounter Date: 06/29/2016      PT End of Session - 06/29/16 1022    Visit Number 14   Number of Visits 19   Date for PT Re-Evaluation Jul 23, 2016   Authorization Type g codes at 2022-07-24 visit   PT Start Time 1016   PT Stop Time 1102   PT Time Calculation (min) 46 min   Activity Tolerance Patient tolerated treatment well   Behavior During Therapy [facility] for tasks assessed/performed      Past Medical History:  Diagnosis Date  . Allergic rhinoconjunctivitis   . Anxiety    pt. managed- uses deep breathing   . Arthritis    low back , stenosis  . COLONIC POLYPS, RECURRENT 08/29/2006   2008 last colonoscopy. No further colonoscopy.     Marland Kitchen COPD (chronic obstructive pulmonary disease) ([place])   . Diverticulosis   . DVT (deep venous thrombosis) ([place])   . Eczema   . Environmental allergies    allergy shot- q friday in Dr. Janee Morn office. PFT's abnormal- recommended Spiriva to use preop & will d/c after surgery  . GERD (gastroesophageal reflux disease)   . H/O hiatal hernia   . History of colonic polyps   . Hyperlipidemia   . Hypertension   . Lung nodule 2011  . Peripheral vascular disease ([place])   . Shingles   . Stenosis of popliteal artery (HCC)    blood clots in legs long ago    . Trigeminal neuralgia     Past Surgical History:  Procedure Laterality Date  . ABDOMINAL AORTAGRAM N/A 06/17/2011   Procedure: ABDOMINAL Maxcine Ham;  Surgeon: Elam Dutch, MD;  Location: [facility] CATH LAB;  Service: Cardiovascular;  Laterality: N/A;  . CHOLECYSTECTOMY  1998  . EYE SURGERY     cataracts removed- bilateral /w IOL  . FEMORAL-POPLITEAL BYPASS GRAFT        x2 surgeries 1990's & 2009  .  INJECTION KNEE Right Aug. 2016   Gel injection for pain  . LUMBAR FUSION  07/06/2011  . TONSILLECTOMY     as a teenager   . TUBAL LIGATION      There were no vitals filed for this visit.      Subjective Assessment - 06/29/16 1021    Subjective States she is feeling good today.  Did have some getting out of bed.  States she was able to walk to the trash and put the garbage in the dumpster which is a huge improvement   Pertinent History severe arthritis Rt knee, history of abdominal surgery, history of DVT   Currently in Pain? No/denies                         [facility] Adult PT Treatment/Exercise - 06/29/16 0001      Lumbar Exercises: Aerobic   Stationary Bike Nustep L2 x 10 min  PTA present     Lumbar Exercises: Standing   Other Standing Lumbar Exercises Weight shifting on mini tramp 3 ways 1 min   Other Standing Lumbar Exercises mini squats 20x  incorporating breathing for greater core stability     Lumbar Exercises: Seated   Long Arc Quad on Chair Strengthening;Both;1 set;Weights;20 reps  LAQ on Chair Weights (lbs) 2#   Sit to Stand 10 reps  No UE, 2 sets, exhale for contracting when standing     Lumbar Exercises: Supine   Clam 20 reps  red band   Bridge 20 reps  small lift with exhale   Straight Leg Raise 10 reps   Other Supine Lumbar Exercises ball squeeze on exhale - 20x                  PT Short Term Goals - 06/15/16 0936      PT SHORT TERM GOAL #1   Title independent with initial HEP   Time 4   Period Weeks   Status Achieved     PT SHORT TERM GOAL #2   Title improved upright posture in standing due to increased hip extension and increased glute strength   Time 4   Period Weeks   Status Achieved     PT SHORT TERM GOAL #3   Title able to stand for 20 minutes due to increased strength and endurance   Time 4   Period Weeks   Status Achieved           PT Long Term Goals - 06/29/16 1029      PT LONG TERM GOAL #5   Title  Improve TUG to <14sec to demonstrate decreased risk of falls   Baseline 11 sec   Time 8   Period Weeks   Status Achieved               Plan - 06/29/16 1100    Clinical Impression Statement Patient demonstrates improved TUG score to 11 seconds and achieved long term goal.  Able to demonstrate improved tolerance for sit to stand.  Able to perform squats today with good alignment after receiving cues.  Needs a lot of cues to coordinate breathing correctly during exercises for maximum core stability.  Patient will benefit from skilled PT to continue to work on core strength ans stablility for reduced pain and improved function.   Rehab Potential Excellent   Clinical Impairments Affecting Rehab Potential severe arthritis Rt knee, history of abdominal surgery, history of DVT   PT Treatment/Interventions Cryotherapy;Electrical Stimulation;Moist Heat;Therapeutic activities;Therapeutic exercise;Balance training;Neuromuscular re-education;Patient/family education;Manual techniques;Biofeedback;Passive range of motion;Taping;Dry needling;Canalith Repostioning   PT Next Visit Plan review and add to HEP as needed; balance, core and LE strengthening   Consulted and Agree with Plan of Care Patient      Patient will benefit from skilled therapeutic intervention in order to improve the following deficits and impairments:  Abnormal gait, Decreased activity tolerance, Decreased balance, Decreased endurance, Decreased range of motion, Difficulty walking, Decreased strength, Increased muscle spasms, Pain  Visit Diagnosis: Other abnormalities of gait and mobility  Muscle weakness (generalized)     Problem List Patient Active Problem List   Diagnosis Date Noted  . Unilateral primary osteoarthritis, right knee 04/07/2016  . Acute gout of left foot 03/22/2016  . Pain in joint, lower leg 08/28/2014  . Trigeminal neuralgia   . Encounter for therapeutic drug monitoring 10/21/2013  . Syncope 12/29/2011   . Chronic low back pain 09/13/2010  . Allergic rhinitis due to pollen 06/25/2010  . COPD mixed type ([place]) 01/22/2010  . Solitary pulmonary nodule 01/12/2010  . Hyperglycemia 01/06/2009  . Osteopenia 01/06/2009  . Eczema 11/26/2007  . Peripheral arterial disease ([place]) 06/29/2007  . Hyperlipidemia 10/23/2006  . Essential hypertension 10/23/2006  . ESOPHAGEAL STRICTURE 08/09/1993    Zannie Cove , PT 06/29/2016,  12:49 PM  [facility] [address], [place], [name], [address] Phone: [phone]   Fax:  [phone]  Name: TYRONE BALASH MRN: [id number] Date of Birth: 1924/07/28

## 2016-07-04 ENCOUNTER — Encounter: Payer: Medicare Other | Admitting: Physical Therapy

## 2016-07-06 ENCOUNTER — Ambulatory Visit: Payer: Medicare Other | Admitting: Physical Therapy

## 2016-07-06 ENCOUNTER — Encounter: Payer: Self-pay | Admitting: Physical Therapy

## 2016-07-06 DIAGNOSIS — R2689 Other abnormalities of gait and mobility: Secondary | ICD-10-CM

## 2016-07-06 DIAGNOSIS — M6281 Muscle weakness (generalized): Secondary | ICD-10-CM

## 2016-07-06 NOTE — Therapy (Signed)
[facility] [address] [place], [name], [address] Phone: [phone]   Fax:  [phone]  Physical Therapy Treatment  Patient Details  Name: [name] [name] MRN: [id number] Date of Birth: 1924/06/08 Referring Provider: Jamse Arn, MD  Encounter Date: 07/06/2016      PT End of Session - 07/06/16 1016    Visit Number 15   Number of Visits 19   Date for PT Re-Evaluation 2016-08-03   Authorization Type g codes at August 04, 2022 visit, KX    PT Start Time 1011   PT Stop Time 1049   PT Time Calculation (min) 38 min   Behavior During Therapy [facility] for tasks assessed/performed      Past Medical History:  Diagnosis Date  . Allergic rhinoconjunctivitis   . Anxiety    pt. managed- uses deep breathing   . Arthritis    low back , stenosis  . COLONIC POLYPS, RECURRENT 08/29/2006   2008 last colonoscopy. No further colonoscopy.     Marland Kitchen COPD (chronic obstructive pulmonary disease) ([place])   . Diverticulosis   . DVT (deep venous thrombosis) ([place])   . Eczema   . Environmental allergies    allergy shot- q friday in Dr. Janee Morn office. PFT's abnormal- recommended Spiriva to use preop & will d/c after surgery  . GERD (gastroesophageal reflux disease)   . H/O hiatal hernia   . History of colonic polyps   . Hyperlipidemia   . Hypertension   . Lung nodule 2011  . Peripheral vascular disease ([place])   . Shingles   . Stenosis of popliteal artery (HCC)    blood clots in legs long ago    . Trigeminal neuralgia     Past Surgical History:  Procedure Laterality Date  . ABDOMINAL AORTAGRAM N/A 06/17/2011   Procedure: ABDOMINAL Maxcine Ham;  Surgeon: Elam Dutch, MD;  Location: [facility] CATH LAB;  Service: Cardiovascular;  Laterality: N/A;  . CHOLECYSTECTOMY  1998  . EYE SURGERY     cataracts removed- bilateral /w IOL  . FEMORAL-POPLITEAL BYPASS GRAFT        x2 surgeries 1990's & 2009  . INJECTION KNEE Right Aug. 2016   Gel injection for  pain  . LUMBAR FUSION  07/06/2011  . TONSILLECTOMY     as a teenager   . TUBAL LIGATION      There were no vitals filed for this visit.      Subjective Assessment - 07/06/16 1013    Subjective Pt has lost 5# and feels overall stronger. She purchased a floor bike she is using 3x day for 8 min.  Her goal is to progress to 10 min 3x day.    Currently in Pain? Yes   Pain Score 2    Pain Location Back   Pain Orientation Lower;Right   Pain Descriptors / Indicators Dull;Aching   Aggravating Factors  Random at this point   Pain Relieving Factors Moderate exercise   Multiple Pain Sites No                         [facility] Adult PT Treatment/Exercise - 07/06/16 0001      Lumbar Exercises: Aerobic   Stationary Bike Nustep L2 x 10 min  PTA present     Lumbar Exercises: Standing   Other Standing Lumbar Exercises Weight shifting on mini tramp 3 ways 1 min   Other Standing Lumbar Exercises Step ups alternating 6x,   light  UE support     Lumbar Exercises: Seated   Long Arc Quad on Chair Strengthening;Both;1 set;Weights;20 reps   LAQ on Chair Weights (lbs) 2#  added ball squeeze concurrent   LAQ on Chair Limitations Red band clamshells 2x 15   Sit to Stand 10 reps  No UE, 2 sets, exhale for contracting when standing   Sit to Stand Limitations Ball squeeze with concurrent TA contraction 20x  VC to not use ankles     Shoulder Exercises: Seated   Horizontal ABduction --  2x6 red band,VC for form, relax UT bil                  PT Short Term Goals - 06/15/16 0936      PT SHORT TERM GOAL #1   Title independent with initial HEP   Time 4   Period Weeks   Status Achieved     PT SHORT TERM GOAL #2   Title improved upright posture in standing due to increased hip extension and increased glute strength   Time 4   Period Weeks   Status Achieved     PT SHORT TERM GOAL #3   Title able to stand for 20 minutes due to increased strength and endurance   Time 4    Period Weeks   Status Achieved           PT Long Term Goals - 07/06/16 1016      PT LONG TERM GOAL #1   Title independent with advanced HEP   Time 8   Period Weeks   Status On-going     PT LONG TERM GOAL #3   Title Pt will be able to stand for at least 30 minutes in order to prepare meal   Time 8   Period Weeks   Status Achieved     PT LONG TERM GOAL #4   Title Pt will be able to do grocery shopping without feeling like her legs wanting to give out   Time 8   Period Weeks   Status Achieved               Plan - 07/06/16 1016    Clinical Impression Statement Pt continues to do excellent: less pain with all ADls. 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 pt had some difficulty with shoulder horizontal abd , just weakness. Plan to add that to HEP soon. Still tends to use upper taps more than necessary.    Rehab Potential Excellent   Clinical Impairments Affecting Rehab Potential severe arthritis Rt knee, history of abdominal surgery, history of DVT   PT Frequency 2x / week   PT Duration 8 weeks   PT Treatment/Interventions Cryotherapy;Electrical Stimulation;Moist Heat;Therapeutic activities;Therapeutic exercise;Balance training;Neuromuscular re-education;Patient/family education;Manual techniques;Biofeedback;Passive range of motion;Taping;Dry needling;Canalith Repostioning   PT Next Visit Plan postural exs, and add to HEP as needed; balance, core and LE strengthening   Consulted and Agree with Plan of Care Patient      Patient will benefit from skilled therapeutic intervention in order to improve the following deficits and impairments:     Visit Diagnosis: Other abnormalities of gait and mobility  Muscle weakness (generalized)     Problem List Patient Active Problem List   Diagnosis Date Noted  . Unilateral primary osteoarthritis, right knee 04/07/2016  . Acute gout of left foot  03/22/2016  . Pain in joint, lower  leg 08/28/2014  . Trigeminal neuralgia   . Encounter for therapeutic drug monitoring 10/21/2013  . Syncope 12/29/2011  . Chronic low back pain 09/13/2010  . Allergic rhinitis due to pollen 06/25/2010  . COPD mixed type ([place]) 01/22/2010  . Solitary pulmonary nodule 01/12/2010  . Hyperglycemia 01/06/2009  . Osteopenia 01/06/2009  . Eczema 11/26/2007  . Peripheral arterial disease ([place]) 06/29/2007  . Hyperlipidemia 10/23/2006  . Essential hypertension 10/23/2006  . ESOPHAGEAL STRICTURE 08/09/1993    Kyndahl Jablon, PTA 07/06/2016, 10:51 AM  [facility] [address] [place], [name], [address] Phone: [phone]   Fax:  [phone]  Name: [name] [name] MRN: [id number] Date of Birth: 09-Oct-1924

## 2016-07-08 ENCOUNTER — Ambulatory Visit (INDEPENDENT_AMBULATORY_CARE_PROVIDER_SITE_OTHER): Payer: Medicare Other | Admitting: General Practice

## 2016-07-08 DIAGNOSIS — Z5181 Encounter for therapeutic drug level monitoring: Secondary | ICD-10-CM

## 2016-07-08 DIAGNOSIS — I749 Embolism and thrombosis of unspecified artery: Secondary | ICD-10-CM

## 2016-07-08 LAB — POCT INR: INR: 1.9

## 2016-07-08 NOTE — Patient Instructions (Signed)
Pre visit review using our clinic review tool, if applicable. No additional management support is needed unless otherwise documented below in the visit note. 

## 2016-07-08 NOTE — Progress Notes (Signed)
I have reviewed and agree with the plan. 

## 2016-07-09 ENCOUNTER — Other Ambulatory Visit: Payer: Self-pay | Admitting: Family Medicine

## 2016-07-11 ENCOUNTER — Ambulatory Visit: Payer: Medicare Other | Admitting: Physical Therapy

## 2016-07-11 ENCOUNTER — Encounter: Payer: Self-pay | Admitting: Physical Therapy

## 2016-07-11 DIAGNOSIS — R2689 Other abnormalities of gait and mobility: Secondary | ICD-10-CM

## 2016-07-11 DIAGNOSIS — M6281 Muscle weakness (generalized): Secondary | ICD-10-CM

## 2016-07-11 NOTE — Therapy (Addendum)
[facility] [address] [place], [name], [address] Phone: [phone]   Fax:  [phone]  Physical Therapy Treatment  Patient Details  Name: [name] [name] MRN: [id number] Date of Birth: Sep 15, 1924 Referring Provider: Jamse Arn, MD  Encounter Date: 07/11/2016      PT End of Session - 07/11/16 0931    Visit Number 16   Number of Visits 26   Date for PT Re-Evaluation 2016/09/19   Authorization Type g codes at 2022/08/16 visit, KX needed    PT Start Time 0931   PT Stop Time 1015   PT Time Calculation (min) 44 min   Activity Tolerance Patient tolerated treatment well   Behavior During Therapy [facility] for tasks assessed/performed      Past Medical History:  Diagnosis Date  . Allergic rhinoconjunctivitis   . Anxiety    pt. managed- uses deep breathing   . Arthritis    low back , stenosis  . COLONIC POLYPS, RECURRENT 08/29/2006   2008 last colonoscopy. No further colonoscopy.     Marland Kitchen COPD (chronic obstructive pulmonary disease) ([place])   . Diverticulosis   . DVT (deep venous thrombosis) ([place])   . Eczema   . Environmental allergies    allergy shot- q friday in Dr. Janee Morn office. PFT's abnormal- recommended Spiriva to use preop & will d/c after surgery  . GERD (gastroesophageal reflux disease)   . H/O hiatal hernia   . History of colonic polyps   . Hyperlipidemia   . Hypertension   . Lung nodule 2011  . Peripheral vascular disease ([place])   . Shingles   . Stenosis of popliteal artery (HCC)    blood clots in legs long ago    . Trigeminal neuralgia     Past Surgical History:  Procedure Laterality Date  . ABDOMINAL AORTAGRAM N/A 06/17/2011   Procedure: ABDOMINAL Maxcine Ham;  Surgeon: Elam Dutch, MD;  Location: [facility] CATH LAB;  Service: Cardiovascular;  Laterality: N/A;  . CHOLECYSTECTOMY  1998  . EYE SURGERY     cataracts removed- bilateral /w IOL  . FEMORAL-POPLITEAL BYPASS GRAFT        x2 surgeries 1990's  & 2009  . INJECTION KNEE Right Aug. 2016   Gel injection for pain  . LUMBAR FUSION  07/06/2011  . TONSILLECTOMY     as a teenager   . TUBAL LIGATION      There were no vitals filed for this visit.      Subjective Assessment - 07/11/16 0939    Subjective I was at the point where I couldn't even get out of the bed in the morning.  I am now able to make up the bed and get dressed.  I have greatly improved.  I am using it 2-4x a day.  I am up to 8 minutes at t atime.  I am building up to 10 minutes.. I have some soreness in the right calf muscle probably from riding the bike.   Pertinent History severe arthritis Rt knee, history of abdominal surgery, history of DVT   Limitations Standing;Walking   How long can you stand comfortably? cannot stand and talk to people   Patient Stated Goals be able to walk further and stand longer   Currently in Pain? No/denies                         [facility] Adult PT Treatment/Exercise - 07/11/16 0001  Lumbar Exercises: Aerobic   Stationary Bike --     Lumbar Exercises: Machines for Strengthening   Leg Press 30# bilateral 20x, 40# bilateral 20x     Lumbar Exercises: Standing   Other Standing Lumbar Exercises Weight shifting on mini tramp 3 ways 1 min     Lumbar Exercises: Seated   Long Arc Quad on Chair Strengthening;Both;1 set;Weights;20 reps   LAQ on Chair Weights (lbs) 5#     Lumbar Exercises: Supine   Clam 20 reps  red band   Bridge 20 reps  red band     Knee/Hip Exercises: Standing   Forward Step Up Both;20 reps;Step Height: 6"                  PT Short Term Goals - 06/15/16 0936      PT SHORT TERM GOAL #1   Title independent with initial HEP   Time 4   Period Weeks   Status Achieved     PT SHORT TERM GOAL #2   Title improved upright posture in standing due to increased hip extension and increased glute strength   Time 4   Period Weeks   Status Achieved     PT SHORT TERM GOAL #3   Title able to  stand for 20 minutes due to increased strength and endurance   Time 4   Period Weeks   Status Achieved           PT Long Term Goals - 07/11/16 1010      PT LONG TERM GOAL #1   Title independent with advanced HEP   Time 6   Period Weeks   Status On-going     PT LONG TERM GOAL #2   Title FOTO < or = to 47%   Baseline 58% limited 3/19   Time 6   Period Weeks   Status On-going     PT LONG TERM GOAL #3   Title Pt will be able to stand for at least 30 minutes in order to prepare meal   Status Achieved     PT LONG TERM GOAL #4   Title Pt will be able to do grocery shopping without feeling like her legs wanting to give out   Status Achieved     PT LONG TERM GOAL #5   Title Improve TUG to <14sec to demonstrate decreased risk of falls   Status Achieved     Additional Long Term Goals   Additional Long Term Goals Yes     PT LONG TERM GOAL #6   Title Pt will be able to spend 30 minutes a day on doing housework so that she can maintain a healthy environment   Baseline 10-15 minutes a day    Time 6   Period Weeks   Status New               Plan - 07/11/16 0955    Clinical Impression Statement Pt has made great progress with skilled PT.  FOTO score was reviewed with patient and improvements shown.  Continues to work towards goals of decreased limitations and decreased pain.  She is still not fully indepenedent with HEP and expresses concern with not having structure.  Pt will benefit from skilled PT for successful transition to HEP and community exercise classes.     Rehab Potential Excellent   Clinical Impairments Affecting Rehab Potential severe arthritis Rt knee, history of abdominal surgery, history of DVT   PT Frequency 1x / week  PT Duration 6 weeks   PT Treatment/Interventions Cryotherapy;Electrical Stimulation;Moist Heat;Therapeutic activities;Therapeutic exercise;Balance training;Neuromuscular re-education;Patient/family education;Manual  techniques;Biofeedback;Passive range of motion;Taping;Dry needling;Canalith Repostioning   PT Next Visit Plan postural exs, and add to HEP as needed; balance, core and LE strengthening   Consulted and Agree with Plan of Care Patient      Patient will benefit from skilled therapeutic intervention in order to improve the following deficits and impairments:  Abnormal gait, Decreased activity tolerance, Decreased balance, Decreased endurance, Decreased range of motion, Difficulty walking, Decreased strength, Increased muscle spasms, Pain  Visit Diagnosis: Other abnormalities of gait and mobility - Plan: PT plan of care cert/re-cert  Muscle weakness (generalized) - Plan: PT plan of care cert/re-cert       G-Codes - Jul 26, 2016 1049    Functional Assessment Tool Used (Outpatient Only) FOTO, TUG, 5 x sit to stand, clnical judgement   Functional Limitation Mobility: Walking and moving around   Mobility: Walking and Moving Around Current Status ([id number]) At least 40 percent but less than 60 percent impaired, limited or restricted   Mobility: Walking and Moving Around Goal Status [phone]) At least 40 percent but less than 60 percent impaired, limited or restricted      Problem List Patient Active Problem List   Diagnosis Date Noted  . Unilateral primary osteoarthritis, right knee 04/07/2016  . Acute gout of left foot 03/22/2016  . Pain in joint, lower leg 08/28/2014  . Trigeminal neuralgia   . Encounter for therapeutic drug monitoring 10/21/2013  . Syncope 12/29/2011  . Chronic low back pain 09/13/2010  . Allergic rhinitis due to pollen 06/25/2010  . COPD mixed type ([place]) 01/22/2010  . Solitary pulmonary nodule 01/12/2010  . Hyperglycemia 01/06/2009  . Osteopenia 01/06/2009  . Eczema 11/26/2007  . Peripheral arterial disease ([place]) 06/29/2007  . Hyperlipidemia 10/23/2006  . Essential hypertension 10/23/2006  . ESOPHAGEAL STRICTURE 08/09/1993    Zannie Cove, PT July 26, 2016, 10:50  AM  [facility] [address] [place], [name], [address] Phone: [phone]   Fax:  [phone]  Name: [name] [name] MRN: [id number] Date of Birth: 04/09/1925

## 2016-07-13 ENCOUNTER — Encounter: Payer: Medicare Other | Admitting: Physical Therapy

## 2016-07-15 ENCOUNTER — Encounter: Payer: Self-pay | Admitting: Family

## 2016-07-19 ENCOUNTER — Telehealth: Payer: Self-pay | Admitting: Family Medicine

## 2016-07-19 NOTE — Telephone Encounter (Signed)
Have tried X 2 to reach the pt.  Receive ringtone and then a loud sound.  No machine to leave a message.  Will try again at a later time.

## 2016-07-19 NOTE — Telephone Encounter (Signed)
Pt is calling to check the status of the handicap placard form.

## 2016-07-20 ENCOUNTER — Other Ambulatory Visit: Payer: Self-pay

## 2016-07-20 ENCOUNTER — Ambulatory Visit: Payer: Medicare Other | Admitting: Physical Therapy

## 2016-07-20 ENCOUNTER — Encounter: Payer: Self-pay | Admitting: Physical Therapy

## 2016-07-20 DIAGNOSIS — I739 Peripheral vascular disease, unspecified: Secondary | ICD-10-CM

## 2016-07-20 DIAGNOSIS — M6281 Muscle weakness (generalized): Secondary | ICD-10-CM | POA: Diagnosis not present

## 2016-07-20 DIAGNOSIS — Z48812 Encounter for surgical aftercare following surgery on the circulatory system: Secondary | ICD-10-CM

## 2016-07-20 DIAGNOSIS — R2689 Other abnormalities of gait and mobility: Secondary | ICD-10-CM | POA: Diagnosis not present

## 2016-07-20 NOTE — Therapy (Signed)
[facility] [address], [place], [name], [address] Phone: [phone]   Fax:  [phone]  Physical Therapy Treatment  Patient Details  Name: [name] [name] MRN: [id number] Date of Birth: 1925/01/14 Referring Provider: Jamse Arn, MD  Encounter Date: 07/20/2016      PT End of Session - 07/20/16 0932    Visit Number 17   Number of Visits 26   Date for PT Re-Evaluation Aug 29, 2016   Authorization Type g codes at 2022/07/26 visit, KX needed    PT Start Time 0925   PT Stop Time 1005   PT Time Calculation (min) 40 min   Activity Tolerance Patient tolerated treatment well   Behavior During Therapy [facility] for tasks assessed/performed      Past Medical History:  Diagnosis Date  . Allergic rhinoconjunctivitis   . Anxiety    pt. managed- uses deep breathing   . Arthritis    low back , stenosis  . COLONIC POLYPS, RECURRENT 08/29/2006   2008 last colonoscopy. No further colonoscopy.     Marland Kitchen COPD (chronic obstructive pulmonary disease) ([place])   . Diverticulosis   . DVT (deep venous thrombosis) ([place])   . Eczema   . Environmental allergies    allergy shot- q friday in Dr. Janee Morn office. PFT's abnormal- recommended Spiriva to use preop & will d/c after surgery  . GERD (gastroesophageal reflux disease)   . H/O hiatal hernia   . History of colonic polyps   . Hyperlipidemia   . Hypertension   . Lung nodule 2011  . Peripheral vascular disease ([place])   . Shingles   . Stenosis of popliteal artery (HCC)    blood clots in legs long ago    . Trigeminal neuralgia     Past Surgical History:  Procedure Laterality Date  . ABDOMINAL AORTAGRAM N/A 06/17/2011   Procedure: ABDOMINAL Maxcine Ham;  Surgeon: Elam Dutch, MD;  Location: [facility] CATH LAB;  Service: Cardiovascular;  Laterality: N/A;  . CHOLECYSTECTOMY  1998  . EYE SURGERY     cataracts removed- bilateral /w IOL  . FEMORAL-POPLITEAL BYPASS GRAFT        x2 surgeries 1990's  & 2009  . INJECTION KNEE Right Aug. 2016   Gel injection for pain  . LUMBAR FUSION  07/06/2011  . TONSILLECTOMY     as a teenager   . TUBAL LIGATION      There were no vitals filed for this visit.      Subjective Assessment - 07/20/16 0933    Pain Orientation Lower;Left                         [facility] Adult PT Treatment/Exercise - 07/20/16 0001      Lumbar Exercises: Stretches   Standing Side Bend 5 reps  2 sec hold: VC to keep LT foot down and stretch gentle     Lumbar Exercises: Standing   Other Standing Lumbar Exercises Weight shifting on mini tramp 3 ways 1 min   Other Standing Lumbar Exercises Step ups alternating 10x,   light UE support     Lumbar Exercises: Seated   Long Arc Quad on Chair Strengthening;Both;2 sets;10 reps;Weights   LAQ on Chair Weights (lbs) 5   LAQ on Chair Limitations Green band 20x with TA contraction   Sit to Stand 10 reps  No UE   Sit to Stand Limitations Ball squeeze with concurrent TA contraction 20x  VC to  not use ankles, MHP to LT LB     Moist Heat Therapy   Number Minutes Moist Heat --  During seated exs to LT low back                  PT Short Term Goals - 06/15/16 0936      PT SHORT TERM GOAL #1   Title independent with initial HEP   Time 4   Period Weeks   Status Achieved     PT SHORT TERM GOAL #2   Title improved upright posture in standing due to increased hip extension and increased glute strength   Time 4   Period Weeks   Status Achieved     PT SHORT TERM GOAL #3   Title able to stand for 20 minutes due to increased strength and endurance   Time 4   Period Weeks   Status Achieved           PT Long Term Goals - 07/20/16 8119      PT LONG TERM GOAL #6   Title Pt will be able to spend 30 minutes a day on doing housework so that she can maintain a healthy environment   Baseline 10-15 minutes a day    Time 6   Period Weeks   Status Partially Met               Plan -  07/20/16 0933    Clinical Impression Statement One incredible improvement that pt reported today was that she does not leak urine any more since doing her PT exercises. This she reports was unexpected.  Red and green bands issued for HEP advancement.    Rehab Potential Excellent   Clinical Impairments Affecting Rehab Potential severe arthritis Rt knee, history of abdominal surgery, history of DVT   PT Frequency 1x / week   PT Duration 6 weeks   PT Treatment/Interventions Cryotherapy;Electrical Stimulation;Moist Heat;Therapeutic activities;Therapeutic exercise;Balance training;Neuromuscular re-education;Patient/family education;Manual techniques;Biofeedback;Passive range of motion;Taping;Dry needling;Canalith Repostioning   Consulted and Agree with Plan of Care Patient      Patient will benefit from skilled therapeutic intervention in order to improve the following deficits and impairments:  Abnormal gait, Decreased activity tolerance, Decreased balance, Decreased endurance, Decreased range of motion, Difficulty walking, Decreased strength, Increased muscle spasms, Pain  Visit Diagnosis: Other abnormalities of gait and mobility  Muscle weakness (generalized)     Problem List Patient Active Problem List   Diagnosis Date Noted  . Unilateral primary osteoarthritis, right knee 04/07/2016  . Acute gout of left foot 03/22/2016  . Pain in joint, lower leg 08/28/2014  . Trigeminal neuralgia   . Encounter for therapeutic drug monitoring 10/21/2013  . Syncope 12/29/2011  . Chronic low back pain 09/13/2010  . Allergic rhinitis due to pollen 06/25/2010  . COPD mixed type ([place]) 01/22/2010  . Solitary pulmonary nodule 01/12/2010  . Hyperglycemia 01/06/2009  . Osteopenia 01/06/2009  . Eczema 11/26/2007  . Peripheral arterial disease ([place]) 06/29/2007  . Hyperlipidemia 10/23/2006  . Essential hypertension 10/23/2006  . ESOPHAGEAL STRICTURE 08/09/1993    Cledis Sohn, PTA 07/20/2016,  10:09 AM   [facility] [address], [place], [name], [address] Phone: [phone]   Fax:  [phone]  Name: [name] [name] MRN: [id number] Date of Birth: January 29, 1925

## 2016-07-20 NOTE — Telephone Encounter (Signed)
Tried to reach the pt by telephone.  Once again received a loud beep and no machine to leave a message.  Will try again at a later time.  Papers are available for pick up at the front desk.

## 2016-07-20 NOTE — Telephone Encounter (Signed)
Pt is aware that the paperwork is ready for pick-up and will be here today.

## 2016-07-21 ENCOUNTER — Encounter: Payer: Self-pay | Admitting: Family

## 2016-07-21 ENCOUNTER — Ambulatory Visit (INDEPENDENT_AMBULATORY_CARE_PROVIDER_SITE_OTHER): Payer: Medicare Other | Admitting: Family

## 2016-07-21 ENCOUNTER — Ambulatory Visit (HOSPITAL_COMMUNITY)
Admission: RE | Admit: 2016-07-21 | Discharge: 2016-07-21 | Disposition: A | Discharge status: Home / Self Care | Payer: Medicare Other | Source: Ambulatory Visit | Attending: Family | Admitting: Family

## 2016-07-21 VITALS — BP 140/64 | HR 74 | Temp 99.4°F | Resp 20 | Ht 60.0 in | Wt 174.0 lb

## 2016-07-21 DIAGNOSIS — Z95828 Presence of other vascular implants and grafts: Secondary | ICD-10-CM | POA: Insufficient documentation

## 2016-07-21 DIAGNOSIS — I779 Disorder of arteries and arterioles, unspecified: Secondary | ICD-10-CM

## 2016-07-21 DIAGNOSIS — Z87891 Personal history of nicotine dependence: Secondary | ICD-10-CM | POA: Insufficient documentation

## 2016-07-21 DIAGNOSIS — I739 Peripheral vascular disease, unspecified: Secondary | ICD-10-CM | POA: Diagnosis not present

## 2016-07-21 DIAGNOSIS — Z48812 Encounter for surgical aftercare following surgery on the circulatory system: Secondary | ICD-10-CM | POA: Insufficient documentation

## 2016-07-21 NOTE — Patient Instructions (Signed)

## 2016-07-21 NOTE — Progress Notes (Signed)
[facility] [place]   CC: Follow up peripheral artery occlusive disease  History of Present Illness [name] [name] is a 83 y.o. female patient of Dr. Oneida Alar who is status post bilateral femoropopliteal bypass with [name] [phone] and 2009), she's also had thrombolysis angioplasty of the right femoropopliteal bypass in 2010, again on 10/13/13, and thrombectomy angioplasty right interpositional graft from above the knee to below the knee popliteal artery in 2010.  She returns today for follow up.  On 10/13/13 she underwent arteriogram by interventional radiology at [facility] of her right lower extremity. Her arteriogram revealed an occluded right femoral to below-knee popliteal bypass graft. Lysis was initiated through the left common femoral artery with a 44F sheath. Following her procedure, she was placed on a heparin drip and started on coumadin. Her left femoral sheath site had no active bleeding or hematoma. She had palpable dorsalis pedis pulses. She stayed in the hospital for an additional day after her lysis intervention due to deconditioning. After working with physical therapy, she was recommended to go home with a rolling walker. The remainder of the hospital course consisted of increasing mobilization and increasing intake of solids without difficulty. She was discharged home with home health.   She reports remarkable improvement since she has been receiving physical therapy with [facility] under the direction of Dr. Posey Pronto.  Her walking was limited by low back pain, she has had lumbar fusion in March, 2013, states she was told that she has arthritis in her back which was causing this pain.  She has been having pain in her right knee since 2016 which also limits her walking. She receives injections in the knee which helps.  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   Pt states her blood pressure usually runs 130-140/60's, states it goes up in a medical  office.  She no longer has claudication symptoms with walking since the above vascular procedures, she denies non healing wounds.  She denies any history of stroke or TIA symptoms, denies cardiac problems.  She has been making a concerted effort to lose weight.   Pt Diabetic: No Pt smoker: former smoker, quit about 1995  Pt meds include:  Statin :Yes ASA: Yes Other anticoagulants/antiplatelets: coumadin started by Dr. Oneida Alar July, 2015, and chronically managed by her PCP, was Dr. Arnoldo Morale, then Dr. Yong Channel     Past Medical History:  Diagnosis Date  . Allergic rhinoconjunctivitis   . Anxiety    pt. managed- uses deep breathing   . Arthritis    low back , stenosis  . COLONIC POLYPS, RECURRENT 08/29/2006   2008 last colonoscopy. No further colonoscopy.     Marland Kitchen COPD (chronic obstructive pulmonary disease) ([place])   . Diverticulosis   . DVT (deep venous thrombosis) ([place])   . Eczema   . Environmental allergies    allergy shot- q friday in Dr. Janee Morn office. PFT's abnormal- recommended Spiriva to use preop & will d/c after surgery  . GERD (gastroesophageal reflux disease)   . H/O hiatal hernia   . History of colonic polyps   . Hyperlipidemia   . Hypertension   . Lung nodule 2011  . Peripheral vascular disease ([place])   . Shingles   . Stenosis of popliteal artery (HCC)    blood clots in legs long ago    . Trigeminal neuralgia     Social History Social History  Substance Use Topics  . Smoking status: Former Smoker  Packs/day: 2.00    Years: 30.00    Types: Cigarettes    Quit date: 06/18/1993  . Smokeless tobacco: Never Used     Comment: QUIT IN 1995  . Alcohol use No    Family History Family History  Problem Relation Age of Onset  . Arthritis Mother   . Hypertension Mother   . Heart disease Father   . Colon polyps Father   . Breast cancer Sister   . Lung cancer Sister   . Colon cancer Sister   . Cancer Sister     Breast  . Cancer Daughter     Breast   . Hypertension Son   . Lung cancer Brother   . Cancer Brother     Lung  . Anesthesia problems Neg Hx   . Hypotension Neg Hx   . Malignant hyperthermia Neg Hx   . Pseudochol deficiency Neg Hx     Past Surgical History:  Procedure Laterality Date  . ABDOMINAL AORTAGRAM N/A 06/17/2011   Procedure: ABDOMINAL Maxcine Ham;  Surgeon: Elam Dutch, MD;  Location: [facility] CATH LAB;  Service: Cardiovascular;  Laterality: N/A;  . CHOLECYSTECTOMY  1998  . EYE SURGERY     cataracts removed- bilateral /w IOL  . FEMORAL-POPLITEAL BYPASS GRAFT        x2 surgeries 1990's & 2009  . INJECTION KNEE Right Aug. 2016   Gel injection for pain  . LUMBAR FUSION  07/06/2011  . TONSILLECTOMY     as a teenager   . TUBAL LIGATION      Allergies  Allergen Reactions  . Sulfa Antibiotics Other (See Comments)    Cold sweat light headed and disorientation  . Tiotropium Bromide Shortness Of Breath and Other (See Comments)    Sore throat also  . Tramadol Nausea And Vomiting  . Latex Itching and Rash    Current Outpatient Prescriptions  Medication Sig Dispense Refill  . aspirin EC 81 MG tablet Take 81 mg by mouth daily.    . calcium-vitamin D (OSCAL WITH D 500-200) 500-200 MG-UNIT per tablet Take 1 tablet by mouth daily.      . Cholecalciferol (VITAMIN D3) 1000 UNITS CAPS Take 1,000 Units by mouth daily.     . colchicine (COLCRYS) 0.6 MG tablet Take 2 pills at first sign gout flare, then another pill 2 hours later if still having pain. Then take once daily until pain resolves. 30 tablet 0  . DULoxetine (CYMBALTA) 60 MG capsule Take 1 capsule (60 mg total) by mouth daily. 30 capsule 1  . glucosamine-chondroitin 500-400 MG tablet Take 1 tablet by mouth 2 (two) times daily.     . methocarbamol (ROBAXIN) 500 MG tablet Take 1 tablet (500 mg total) by mouth 2 (two) times daily as needed for muscle spasms. 60 tablet 1  . omeprazole (PRILOSEC) 20 MG capsule Take 20 mg by mouth every morning.     . simvastatin (ZOCOR)  40 MG tablet TAKE ONE TABLET BY MOUTH ONCE DAILY AT  6PM 90 tablet 2  . valsartan-hydrochlorothiazide (DIOVAN-HCT) 320-25 MG tablet TAKE ONE TABLET BY MOUTH ONCE DAILY 90 tablet 1  . warfarin (COUMADIN) 5 MG tablet TAKE TWO AND ONE-HALF TABLETS BY MOUTH ON THURSDAY, AND THEN TAKE TWO TABLETS THE REST OF THE DAYS (Patient taking differently: TAKE 1 1/2 TABLETS BY MOUTH ON MONDAYS, AND THEN TAKE ONE TABLET ALL OTHER DAYS.) 180 tablet 1  . acetaminophen (TYLENOL) 500 MG tablet Take 500-1,500 mg by mouth 3 (three) times daily  as needed for moderate pain. Reported on 08/31/2015     No current facility-administered medications for this visit.     ROS: See HPI for pertinent positives and negatives.   Physical Examination  Vitals:   07/21/16 0928 07/21/16 0939  BP: (!) 147/78 140/64  Pulse: 74   Resp: 20   Temp: 99.4 F (37.4 C)   TempSrc: Oral   SpO2: 93%   Weight: 174 lb (78.9 kg)   Height: 5' (1.524 m)    Body mass index is 33.98 kg/m.  General: A&O x 3, WDWN, obese female.  Gait: normal Eyes: PERRLA.  Pulmonary: Respirations are non labored, CTAB, no wheezes, rales or rhonchi.  Cardiac: regular rhythm with occasional premature contractions, no detected murmur.   Carotid Bruits Left Right   Negative  Negative    Aorta is not palpable.  Radial pulses: 2+ palpable and =.   VASCULAR EXAM: Extremitieswithout ischemic changes  without Gangrene; without open wounds.   LE Pulses  LEFT  RIGHT   FEMORAL  not palpable(obese) not palpable (obese)  POPLITEAL  not palpable  not palpable  POSTERIOR TIBIAL  not palpable  not palpable   DORSALIS PEDIS ANTERIOR TIBIAL  2+palpable  2+palpable    Abdomen: soft, NT, no masses palpated.  Skin: no rashes, no ulcers.  Musculoskeletal: no muscle wasting or atrophy.  Neurologic: A&O X 3; Appropriate Affect ; SENSATION: normal; MOTOR FUNCTION: moving all extremities equally, motor strength 5/5  throughout. Speech is fluent/normal. CN 2-12 intact.      ASSESSMENT: KADASIA KASSING is a 83 y.o. female who is status post bilateral femoropopliteal bypass with [name] [phone] and 2009), she's also had thrombolysis angioplasty of the right femoropopliteal bypass in 2010, again on 10/13/13,and thrombectomy angioplasty right interpositional graft from above the knee to below the knee popliteal artery in 2010.  She no longer has claudication symptoms with walking since the above vascular procedures, but she probably does not walk very much since her walking is limited by back pain and right knee pain. She has no signs of ischemia in her feet/legs.  She states she has been doing daily seated leg exercises.  I discussed pt status with Dr. Oneida Alar after her 01-21-16 visit. He advised to just get ABIs due to lack of symptoms and overall all health decline, no longer obtain lower extremity arterial duplex.  But since that visit she has made remarkable improvement which she attributes to physical therapy with [facility]; she started this in January 2018 and continues. The significant improvement in her bilateral ankle-brachial indices reflects her remarkable improvement, she had moderate bilateral arterial occlusive disease, now has mild disease.   DATA  07-21-16 ABI's:  Right: 0.92 (0.65, 01-21-16), waveforms: PT: biphasic; DP: triphasic; TBI: 0.55, toe pressure: 72 Left: 0.89, (0.70, 01-21-16), waveforms: PT: biphasic; DP: triphasic; TBI: 0.67, toe pressure: 88 Significant improvement in bilateral ABI.   01-21-16 bilateral LE arterial duplex suggests right lower extremity bypass graft with proximal anastomosis stenosis of 50-70% (376 cm/s). Left LE arterial bypass graft with no evidence of restenosis; however, likely >50% stenosis of the left common femoral artery.  Possible greater than 50% stenosis of the left proximal CFA.    PLAN:  Continued daily seated leg exercises; consider  adding senior water aerobics as discussed.  Based on the patient's vascular studies and examination, pt will return to clinic in 9 months with ABI's. I advised her to notify us if she develops concerns re the circulation in  her feet/legs.   I discussed in depth with the patient the nature of atherosclerosis, and emphasized the importance of maximal medical management including strict control of blood pressure, blood glucose, and lipid levels, obtaining regular exercise, and continued cessation of smoking.  The patient is aware that without maximal medical management the underlying atherosclerotic disease process will progress, limiting the benefit of any interventions.  The patient was given information about PAD including signs, symptoms, treatment, what symptoms should prompt the patient to seek immediate medical care, and risk reduction measures to take.  Clemon Chambers, RN, MSN, FNP-C [facility] [employer] Phone: [phone]  Clinic MD: [facility]  07/21/16 9:46 AM

## 2016-07-27 ENCOUNTER — Ambulatory Visit: Payer: Medicare Other | Attending: Physical Medicine & Rehabilitation

## 2016-07-27 DIAGNOSIS — M6281 Muscle weakness (generalized): Secondary | ICD-10-CM | POA: Diagnosis not present

## 2016-07-27 DIAGNOSIS — R2689 Other abnormalities of gait and mobility: Secondary | ICD-10-CM | POA: Diagnosis not present

## 2016-07-27 NOTE — Therapy (Signed)
[facility] [address], [place], [name], [address] Phone: [phone]   Fax:  [phone]  Physical Therapy Treatment  Patient Details  Name: [name] [name] MRN: [id number] Date of Birth: 1925/01/27 Referring Provider: Jamse Arn, MD  Encounter Date: 07/27/2016      PT End of Session - 07/27/16 0926    Visit Number 18   Number of Visits 26   Date for PT Re-Evaluation 2016/09/02   Authorization Type g codes at 07/01/2022 visit, KX needed    PT Start Time 0923   PT Stop Time 1027   PT Time Calculation (min) 64 min   Activity Tolerance Patient tolerated treatment well   Behavior During Therapy [facility] for tasks assessed/performed      Past Medical History:  Diagnosis Date  . Allergic rhinoconjunctivitis   . Anxiety    pt. managed- uses deep breathing   . Arthritis    low back , stenosis  . COLONIC POLYPS, RECURRENT 08/29/2006   2008 last colonoscopy. No further colonoscopy.     Marland Kitchen COPD (chronic obstructive pulmonary disease) ([place])   . Diverticulosis   . DVT (deep venous thrombosis) ([place])   . Eczema   . Environmental allergies    allergy shot- q friday in Dr. Janee Morn office. PFT's abnormal- recommended Spiriva to use preop & will d/c after surgery  . GERD (gastroesophageal reflux disease)   . H/O hiatal hernia   . History of colonic polyps   . Hyperlipidemia   . Hypertension   . Lung nodule 2011  . Peripheral vascular disease ([place])   . Shingles   . Stenosis of popliteal artery (HCC)    blood clots in legs long ago    . Trigeminal neuralgia     Past Surgical History:  Procedure Laterality Date  . ABDOMINAL AORTAGRAM N/A 06/17/2011   Procedure: ABDOMINAL Maxcine Ham;  Surgeon: Elam Dutch, MD;  Location: [facility] CATH LAB;  Service: Cardiovascular;  Laterality: N/A;  . CHOLECYSTECTOMY  1998  . EYE SURGERY     cataracts removed- bilateral /w IOL  . FEMORAL-POPLITEAL BYPASS GRAFT        x2 surgeries 1990's &  2009  . INJECTION KNEE Right Aug. 2016   Gel injection for pain  . LUMBAR FUSION  07/06/2011  . TONSILLECTOMY     as a teenager   . TUBAL LIGATION      There were no vitals filed for this visit.      Subjective Assessment - 07/27/16 0925    Subjective Pt. reporting pain level today is, "normal".     Patient Stated Goals be able to walk further and stand longer   Currently in Pain? Yes   Pain Score 2    Pain Location Back   Pain Orientation Left   Pain Descriptors / Indicators Dull;Aching   Pain Type Chronic pain   Multiple Pain Sites No                         [facility] Adult PT Treatment/Exercise - 07/27/16 0943      Lumbar Exercises: Aerobic   Stationary Bike NuStep: Lvl 2, 10 min      Lumbar Exercises: Machines for Strengthening   Leg Press 35# bilateral 20x, 45# bilateral 20x     Lumbar Exercises: Standing   Other Standing Lumbar Exercises Alternating 5# toe-clear to 8" step forward/lateral x 10 reps each way; supervision  Lumbar Exercises: Seated   Long Arc Quad on Chair Strengthening;Both;Weights;15 reps;1 set   LAQ on [employer] (lbs) 5   LAQ on Chair Limitations with adduction ball squeeze    Sit to Stand 10 reps  no UE support; to airex pad; 2 sets      Knee/Hip Exercises: Standing   Hip Abduction AROM;Right;Left;Stengthening;1 set;15 reps;Knee straight   Abduction Limitations 2#; holding onto counter    Hip Extension AROM;Right;Left;Stengthening;1 set;15 reps;Knee straight   Extension Limitations 2#; holding onto counter    Forward Step Up Both;20 reps;Step Height: 6"   Forward Step Up Limitations Supervision     Moist Heat Therapy   Number Minutes Moist Heat 15 Minutes   Moist Heat Location Lumbar Spine     Electrical Stimulation   Electrical Stimulation Location bil. low back   Electrical Stimulation Action IFC   Electrical Stimulation Parameters 80-'150Hz'$ , intensity to pt. tolerance, 15 min    Electrical Stimulation Goals Pain                   PT Short Term Goals - 06/15/16 0936      PT SHORT TERM GOAL #1   Title independent with initial HEP   Time 4   Period Weeks   Status Achieved     PT SHORT TERM GOAL #2   Title improved upright posture in standing due to increased hip extension and increased glute strength   Time 4   Period Weeks   Status Achieved     PT SHORT TERM GOAL #3   Title able to stand for 20 minutes due to increased strength and endurance   Time 4   Period Weeks   Status Achieved           PT Long Term Goals - 07/20/16 3220      PT LONG TERM GOAL #6   Title Pt will be able to spend 30 minutes a day on doing housework so that she can maintain a healthy environment   Baseline 10-15 minutes a day    Time 6   Period Weeks   Status Partially Met               Plan - 07/27/16 0926    Clinical Impression Statement Pt. with mild LBP initially today which remained unchanged throughout treatment.  Pt. tolerated all hip/knee and core strengthening and balance activity well today.  Treatment ending with moist heat/E-stim. to promote muscular relaxation and pain reduction.  Pt. leaving treatment pain free.  Will continue to benefit from further skilled therapy to maximize LE strength and ability to perform functional tasks.     PT Treatment/Interventions Cryotherapy;Electrical Stimulation;Moist Heat;Therapeutic activities;Therapeutic exercise;Balance training;Neuromuscular re-education;Patient/family education;Manual techniques;Biofeedback;Passive range of motion;Taping;Dry needling;Canalith Repostioning   PT Next Visit Plan postural exs, and add to HEP as needed; balance, core and LE strengthening      Patient will benefit from skilled therapeutic intervention in order to improve the following deficits and impairments:  Abnormal gait, Decreased activity tolerance, Decreased balance, Decreased endurance, Decreased range of motion, Difficulty walking, Decreased strength,  Increased muscle spasms, Pain  Visit Diagnosis: Other abnormalities of gait and mobility  Muscle weakness (generalized)     Problem List Patient Active Problem List   Diagnosis Date Noted  . Unilateral primary osteoarthritis, right knee 04/07/2016  . Acute gout of left foot 03/22/2016  . Pain in joint, lower leg 08/28/2014  . Trigeminal neuralgia   . Encounter for therapeutic drug monitoring  10/21/2013  . Syncope 12/29/2011  . Chronic low back pain 09/13/2010  . Allergic rhinitis due to pollen 06/25/2010  . COPD mixed type ([place]) 01/22/2010  . Solitary pulmonary nodule 01/12/2010  . Hyperglycemia 01/06/2009  . Osteopenia 01/06/2009  . Eczema 11/26/2007  . Peripheral arterial disease ([place]) 06/29/2007  . Hyperlipidemia 10/23/2006  . Essential hypertension 10/23/2006  . ESOPHAGEAL STRICTURE 08/09/1993    Bess Harvest, PTA 07/27/16 2:03 PM  [facility] [address] [place], [name], [address] Phone: [phone]   Fax:  [phone]  Name: FENIX RUPPE MRN: [id number] Date of Birth: 06-May-1924

## 2016-07-28 ENCOUNTER — Encounter: Payer: Medicare Other | Admitting: Physical Medicine & Rehabilitation

## 2016-08-03 ENCOUNTER — Ambulatory Visit: Payer: Medicare Other | Admitting: Physical Therapy

## 2016-08-03 ENCOUNTER — Encounter: Payer: Self-pay | Admitting: Physical Therapy

## 2016-08-03 DIAGNOSIS — R2689 Other abnormalities of gait and mobility: Secondary | ICD-10-CM | POA: Diagnosis not present

## 2016-08-03 DIAGNOSIS — M6281 Muscle weakness (generalized): Secondary | ICD-10-CM | POA: Diagnosis not present

## 2016-08-03 NOTE — Therapy (Signed)
[facility] [address], [address] , [place], [address] Phone: [phone]   Fax:  [phone]  Physical Therapy Treatment  Patient Details  Name: [name] [name] MRN: [id number] Date of Birth: 10/06/1924 Referring Provider: Patel, Ankit Anil, MD  Encounter Date: 08/03/2016      PT End of Session - 08/03/16 0933    Visit Number 19   Number of Visits 26   Date for PT Re-Evaluation 08/22/16   Authorization Type g codes at 26th visit, KX needed    PT Start Time 0920   PT Stop Time 1003   PT Time Calculation (min) 43 min   Activity Tolerance Patient tolerated treatment well   Behavior During Therapy [facility] for tasks assessed/performed      Past Medical History:  Diagnosis Date  . Allergic rhinoconjunctivitis   . Anxiety    pt. managed- uses deep breathing   . Arthritis    low back , stenosis  . COLONIC POLYPS, RECURRENT 08/29/2006   2008 last colonoscopy. No further colonoscopy.     . COPD (chronic obstructive pulmonary disease) (HCC)   . Diverticulosis   . DVT (deep venous thrombosis) (HCC)   . Eczema   . Environmental allergies    allergy shot- q friday in Dr. Young's office. PFT's abnormal- recommended Spiriva to use preop & will d/c after surgery  . GERD (gastroesophageal reflux disease)   . H/O hiatal hernia   . History of colonic polyps   . Hyperlipidemia   . Hypertension   . Lung nodule 2011  . Peripheral vascular disease (HCC)   . Shingles   . Stenosis of popliteal artery (HCC)    blood clots in legs long ago    . Trigeminal neuralgia     Past Surgical History:  Procedure Laterality Date  . ABDOMINAL AORTAGRAM N/A 06/17/2011   Procedure: ABDOMINAL AORTAGRAM;  Surgeon: Charles E Fields, MD;  Location: [facility] CATH LAB;  Service: Cardiovascular;  Laterality: N/A;  . CHOLECYSTECTOMY  1998  . EYE SURGERY     cataracts removed- bilateral /w IOL  . FEMORAL-POPLITEAL BYPASS GRAFT        x2 surgeries 1990's  & 2009  . INJECTION KNEE Right Aug. 2016   Gel injection for pain  . LUMBAR FUSION  07/06/2011  . TONSILLECTOMY     as a teenager   . TUBAL LIGATION      There were no vitals filed for this visit.      Subjective Assessment - 08/03/16 0932    Subjective I can only stay 30 min bc my sister is in hospice and I am meeting with a nurse this AM.    Currently in Pain? No/denies   Aggravating Factors  Random, intermittent   Pain Relieving Factors Stretching   Multiple Pain Sites No                         [facility] Adult PT Treatment/Exercise - 08/03/16 0001      Lumbar Exercises: Stretches   Standing Side Bend 5 reps  2 sec hold: VC to keep LT foot down and stretch gentle     Lumbar Exercises: Aerobic   Stationary Bike NuStep: Lvl 2, 10 min      Lumbar Exercises: Standing   Other Standing Lumbar Exercises Weight shifting on mini tramp 3 ways 1 min   Other Standing Lumbar Exercises Step ups alternating 12x,   light UE support       Lumbar Exercises: Seated   Long Arc Quad on Chair Strengthening;Both;Weights;15 reps;1 set   LAQ on Chair Weights (lbs) 5   Sit to Stand 20 reps  Green band seated clamshells 3x 10 with abs contracted   Sit to Stand Limitations Held 2# wts in her hands for ex                  PT Short Term Goals - 06/15/16 0936      PT SHORT TERM GOAL #1   Title independent with initial HEP   Time 4   Period Weeks   Status Achieved     PT SHORT TERM GOAL #2   Title improved upright posture in standing due to increased hip extension and increased glute strength   Time 4   Period Weeks   Status Achieved     PT SHORT TERM GOAL #3   Title able to stand for 20 minutes due to increased strength and endurance   Time 4   Period Weeks   Status Achieved           PT Long Term Goals - 07/20/16 0958      PT LONG TERM GOAL #6   Title Pt will be able to spend 30 minutes a day on doing housework so that she can maintain a healthy  environment   Baseline 10-15 minutes a day    Time 6   Period Weeks   Status Partially Met               Plan - 08/03/16 0933    Clinical Impression Statement Under extreme duress due to sister being in hospice, pt continues to do well, minimal low back pain to no pain.  She has been on her feet  a lot tending to the needs of her sister, but tolerating this well.    Rehab Potential Excellent   Clinical Impairments Affecting Rehab Potential severe arthritis Rt knee, history of abdominal surgery, history of DVT   PT Frequency 1x / week   PT Duration 6 weeks      Patient will benefit from skilled therapeutic intervention in order to improve the following deficits and impairments:  Abnormal gait, Decreased activity tolerance, Decreased balance, Decreased endurance, Decreased range of motion, Difficulty walking, Decreased strength, Increased muscle spasms, Pain  Visit Diagnosis: Other abnormalities of gait and mobility  Muscle weakness (generalized)     Problem List Patient Active Problem List   Diagnosis Date Noted  . Unilateral primary osteoarthritis, right knee 04/07/2016  . Acute gout of left foot 03/22/2016  . Pain in joint, lower leg 08/28/2014  . Trigeminal neuralgia   . Encounter for therapeutic drug monitoring 10/21/2013  . Syncope 12/29/2011  . Chronic low back pain 09/13/2010  . Allergic rhinitis due to pollen 06/25/2010  . COPD mixed type (HCC) 01/22/2010  . Solitary pulmonary nodule 01/12/2010  . Hyperglycemia 01/06/2009  . Osteopenia 01/06/2009  . Eczema 11/26/2007  . Peripheral arterial disease (HCC) 06/29/2007  . Hyperlipidemia 10/23/2006  . Essential hypertension 10/23/2006  . ESOPHAGEAL STRICTURE 08/09/1993    COCHRAN,JENNIFER, PTA 08/03/2016, 10:05 AM  [facility] [address], [address] [place], [place], [address] Phone: [phone]   Fax:  [phone]  Name: [name] [name] MRN:  [id number] Date of Birth: 08/22/1924   

## 2016-08-04 ENCOUNTER — Encounter: Payer: Self-pay | Admitting: Physical Medicine & Rehabilitation

## 2016-08-04 ENCOUNTER — Encounter: Payer: Medicare Other | Attending: Physical Medicine & Rehabilitation | Admitting: Physical Medicine & Rehabilitation

## 2016-08-04 VITALS — BP 158/74 | HR 92 | Resp 14

## 2016-08-04 DIAGNOSIS — Z800 Family history of malignant neoplasm of digestive organs: Secondary | ICD-10-CM | POA: Insufficient documentation

## 2016-08-04 DIAGNOSIS — Z9851 Tubal ligation status: Secondary | ICD-10-CM | POA: Insufficient documentation

## 2016-08-04 DIAGNOSIS — Z8249 Family history of ischemic heart disease and other diseases of the circulatory system: Secondary | ICD-10-CM | POA: Insufficient documentation

## 2016-08-04 DIAGNOSIS — I10 Essential (primary) hypertension: Secondary | ICD-10-CM | POA: Insufficient documentation

## 2016-08-04 DIAGNOSIS — K219 Gastro-esophageal reflux disease without esophagitis: Secondary | ICD-10-CM | POA: Insufficient documentation

## 2016-08-04 DIAGNOSIS — G8929 Other chronic pain: Secondary | ICD-10-CM | POA: Insufficient documentation

## 2016-08-04 DIAGNOSIS — M1711 Unilateral primary osteoarthritis, right knee: Secondary | ICD-10-CM | POA: Insufficient documentation

## 2016-08-04 DIAGNOSIS — Z8371 Family history of colonic polyps: Secondary | ICD-10-CM | POA: Insufficient documentation

## 2016-08-04 DIAGNOSIS — Z5181 Encounter for therapeutic drug level monitoring: Secondary | ICD-10-CM | POA: Insufficient documentation

## 2016-08-04 DIAGNOSIS — Z981 Arthrodesis status: Secondary | ICD-10-CM | POA: Insufficient documentation

## 2016-08-04 DIAGNOSIS — R269 Unspecified abnormalities of gait and mobility: Secondary | ICD-10-CM | POA: Diagnosis not present

## 2016-08-04 DIAGNOSIS — I739 Peripheral vascular disease, unspecified: Secondary | ICD-10-CM | POA: Diagnosis not present

## 2016-08-04 DIAGNOSIS — Z86010 Personal history of colonic polyps: Secondary | ICD-10-CM | POA: Insufficient documentation

## 2016-08-04 DIAGNOSIS — M792 Neuralgia and neuritis, unspecified: Secondary | ICD-10-CM | POA: Diagnosis not present

## 2016-08-04 DIAGNOSIS — M533 Sacrococcygeal disorders, not elsewhere classified: Secondary | ICD-10-CM | POA: Diagnosis not present

## 2016-08-04 DIAGNOSIS — Z803 Family history of malignant neoplasm of breast: Secondary | ICD-10-CM | POA: Insufficient documentation

## 2016-08-04 DIAGNOSIS — M961 Postlaminectomy syndrome, not elsewhere classified: Secondary | ICD-10-CM | POA: Insufficient documentation

## 2016-08-04 DIAGNOSIS — G500 Trigeminal neuralgia: Secondary | ICD-10-CM | POA: Diagnosis not present

## 2016-08-04 DIAGNOSIS — Z8261 Family history of arthritis: Secondary | ICD-10-CM | POA: Diagnosis not present

## 2016-08-04 DIAGNOSIS — G479 Sleep disorder, unspecified: Secondary | ICD-10-CM | POA: Diagnosis not present

## 2016-08-04 DIAGNOSIS — Z86718 Personal history of other venous thrombosis and embolism: Secondary | ICD-10-CM | POA: Diagnosis not present

## 2016-08-04 DIAGNOSIS — Z955 Presence of coronary angioplasty implant and graft: Secondary | ICD-10-CM | POA: Diagnosis not present

## 2016-08-04 DIAGNOSIS — Z9049 Acquired absence of other specified parts of digestive tract: Secondary | ICD-10-CM | POA: Diagnosis not present

## 2016-08-04 DIAGNOSIS — Z87891 Personal history of nicotine dependence: Secondary | ICD-10-CM | POA: Insufficient documentation

## 2016-08-04 DIAGNOSIS — M109 Gout, unspecified: Secondary | ICD-10-CM | POA: Diagnosis not present

## 2016-08-04 DIAGNOSIS — J449 Chronic obstructive pulmonary disease, unspecified: Secondary | ICD-10-CM | POA: Diagnosis not present

## 2016-08-04 DIAGNOSIS — I779 Disorder of arteries and arterioles, unspecified: Secondary | ICD-10-CM | POA: Diagnosis not present

## 2016-08-04 DIAGNOSIS — Z801 Family history of malignant neoplasm of trachea, bronchus and lung: Secondary | ICD-10-CM | POA: Insufficient documentation

## 2016-08-04 DIAGNOSIS — G894 Chronic pain syndrome: Secondary | ICD-10-CM

## 2016-08-04 DIAGNOSIS — E785 Hyperlipidemia, unspecified: Secondary | ICD-10-CM | POA: Diagnosis not present

## 2016-08-04 MED ORDER — METHOCARBAMOL 500 MG PO TABS: 500.0000 mg | ORAL_TABLET | Freq: Two times a day (BID) | ORAL | 1 refills | Status: DC | PRN

## 2016-08-04 MED ORDER — DULOXETINE HCL 60 MG PO CPEP: 60.0000 mg | ORAL_CAPSULE | Freq: Every day | ORAL | 1 refills | Status: DC

## 2016-08-04 NOTE — Progress Notes (Signed)
Subjective:    Patient ID: [name] [name], female    DOB: Dec 02, 1924, 83 y.o.   MRN: [id number]  HPI 83 y/o female with pmhpsh of PAD, COPD, DVT, HTN, Gout, trigeminal neuralgia, OA of right knee with injections, lumbar fusion present for follow up of pain in her back > right leg pain.   Initially stated: Pt stated she has had back pain "all her life".  Getting progressively worse.  Movement improves the pain.  Standing and walking exacerbate the pain. Dull pain.  Non-radiating.  Intermittent.  Denies associated numbness, tingling, weakness.  Lidoderm patch help. Pain limits pt from doing things around the house.   Last clinic visit 06/16/16.  At that time, she was using heat and continues to do so.  She continues to use tylenol with benefit.  She is very happy with her progress and notes improvement in her circulation as well.  She uses lidoderm patches ~1/week. She is still in PT.  She obtained her TENS unit and it provides good benefit.  She continues to take Cymbalta.  She take the Robaxin 500 ~1/day. Denies falls.   Pain Inventory Average Pain 3 Pain Right Now 2 My pain is aching  In the last 24 hours, has pain interfered with the following? General activity 3 Relation with others 2 Enjoyment of life 2 What TIME of day is your pain at its worst? morning, evening  Sleep (in general) Poor  Pain is worse with: walking and standing Pain improves with: rest, therapy/exercise, medication and TENS Relief from Meds: 8  Mobility walk without assistance how many minutes can you walk? 4-5 ability to climb steps?  yes do you drive?  yes Do you have any goals in this area?  yes  Function retired  Neuro/Psych trouble walking  Prior Studies Any changes since last visit?  no  Physicians involved in your care Any changes since last visit?  no   Family History  Problem Relation Age of Onset  . Arthritis Mother   . Hypertension Mother   . Heart disease Father   . Colon polyps  Father   . Breast cancer Sister   . Lung cancer Sister   . Colon cancer Sister   . Cancer Sister     Breast  . Cancer Daughter     Breast  . Hypertension Son   . Lung cancer Brother   . Cancer Brother     Lung  . Anesthesia problems Neg Hx   . Hypotension Neg Hx   . Malignant hyperthermia Neg Hx   . Pseudochol deficiency Neg Hx    Social History   Social History  . Marital status: Single    Spouse name: N/A  . Number of children: 4  . Years of education: 12   Occupational History  .  Retired   Social History Main Topics  . Smoking status: Former Smoker    Packs/day: 2.00    Years: 30.00    Types: Cigarettes    Quit date: 06/18/1993  . Smokeless tobacco: Never Used     Comment: QUIT IN 1995  . Alcohol use No  . Drug use: No  . Sexual activity: Not Asked   Other Topics Concern  . None   Social History Narrative   Patient is widowed with 4 children. 2 grandkids. Lives alone.    Patient is right handed.   Patient has high school education.   Patient drinks 1 cup daily.  Retired from [employer] for 28 years, works 2 days a week until 2016.       Hobbies: volunteers at [employer], [place] from church visits people.          Past Surgical History:  Procedure Laterality Date  . ABDOMINAL AORTAGRAM N/A 06/17/2011   Procedure: ABDOMINAL Maxcine Ham;  Surgeon: Elam Dutch, MD;  Location: [facility] CATH LAB;  Service: Cardiovascular;  Laterality: N/A;  . CHOLECYSTECTOMY  1998  . EYE SURGERY     cataracts removed- bilateral /w IOL  . FEMORAL-POPLITEAL BYPASS GRAFT        x2 surgeries 1990's & 2009  . INJECTION KNEE Right Aug. 2016   Gel injection for pain  . LUMBAR FUSION  07/06/2011  . TONSILLECTOMY     as a teenager   . TUBAL LIGATION     Past Medical History:  Diagnosis Date  . Allergic rhinoconjunctivitis   . Anxiety    pt. managed- uses deep breathing   . Arthritis    low back , stenosis  . COLONIC POLYPS, RECURRENT 08/29/2006   2008  last colonoscopy. No further colonoscopy.     Marland Kitchen COPD (chronic obstructive pulmonary disease) ([place])   . Diverticulosis   . DVT (deep venous thrombosis) ([place])   . Eczema   . Environmental allergies    allergy shot- q friday in Dr. Janee Morn office. PFT's abnormal- recommended Spiriva to use preop & will d/c after surgery  . GERD (gastroesophageal reflux disease)   . H/O hiatal hernia   . History of colonic polyps   . Hyperlipidemia   . Hypertension   . Lung nodule 2011  . Peripheral vascular disease ([place])   . Shingles   . Stenosis of popliteal artery (HCC)    blood clots in legs long ago    . Trigeminal neuralgia    BP (!) 158/74 (BP Location: Left Arm, Patient Position: Sitting, Cuff Size: Normal)   Pulse 92   Resp 14   SpO2 94%   Opioid Risk Score:   Fall Risk Score:  `1  Depression screen PHQ 2/9  Depression screen [facility] 2/9 06/16/2016 05/05/2016 09/14/2015 08/31/2015 08/11/2015 03/07/2015 08/07/2014  Decreased Interest 0 0 0 0 0 0 0  Down, Depressed, Hopeless 0 0 0 0 0 0 0  PHQ - 2 Score 0 0 0 0 0 0 0  Altered sleeping - 1 - - - - -  Tired, decreased energy - 0 - - - - -  Change in appetite - 1 - - - - -  Feeling bad or failure about yourself  - 0 - - - - -  Trouble concentrating - 0 - - - - -  Moving slowly or fidgety/restless - 0 - - - - -  Suicidal thoughts - 0 - - - - -  PHQ-9 Score - 2 - - - - -  Some recent data might be hidden   Review of Systems  HENT: Negative.   Eyes: Negative.   Respiratory: Negative.   Cardiovascular: Negative.   Gastrointestinal: Negative.   Endocrine: Negative.        High blood pressure   Genitourinary: Negative.   Musculoskeletal: Positive for back pain and gait problem.  Skin: Negative.   Allergic/Immunologic: Negative.   Hematological: Negative.   Psychiatric/Behavioral: Negative.   All other systems reviewed and are negative.     Objective:   Physical Exam Gen: NAD. Vital signs reviewed HENT: Normocephalic, Atraumatic Eyes:  EOMI. No  discharge. Cardio: RRR. No JVD. Pulm: B/l clear to auscultation.  Effort normal.  Abd: Soft, BS+ MSK:  Gait WNL.   Mild TTP along sacral PSPs and gluteal muscles Neuro:   Sensation intact to light touch in all LE dermatomes  Strength  5/5 proximally, 5/5 distally   Skin: Open blisters b/l hands.    Assessment & Plan:  83 y/o female with pmhpsh of PAD, COPD, DVT, HTN, Gout, trigeminal neuralgia, OA of right knee with injections, lumbar fusion present for follow up of pain in her back > right leg pain.    1. Chronic low back pain with failed back syndrome   Xray of L-spine and right SI joint reviewed, SI joint unremarkable, degenerative changes in spine with right scoliosis.    Will avoid NSAIDs due to coumadin  Unable to tolerate Gabapentin  Cont heat  Cont tylenol, limit to 2000mg /day  Cont Lidoderm patches (occasionally uses)  Cont PT, pt with great benefit  Cont TENS unit PRN  Cont Cymbalta to 60 mg  Cont Robaxin 500 BID PRN  2. Possible Sacroiliitis on right  Xray unremakrable  Will consider bracing in future, improved at present  3. Sleep disturbance  Significantly improved  Cont melatonin  4. Neuropathic pain  See #1  5. Gait abnormality  Improved  Does not want a cane at present  6. Myalgia  Will consider trigger point injections in future, not needed at present

## 2016-08-05 ENCOUNTER — Ambulatory Visit: Payer: Medicare Other

## 2016-08-08 ENCOUNTER — Ambulatory Visit: Payer: Medicare Other | Admitting: Physical Therapy

## 2016-08-08 ENCOUNTER — Encounter: Payer: Self-pay | Admitting: Physical Therapy

## 2016-08-08 DIAGNOSIS — M6281 Muscle weakness (generalized): Secondary | ICD-10-CM | POA: Diagnosis not present

## 2016-08-08 DIAGNOSIS — R2689 Other abnormalities of gait and mobility: Secondary | ICD-10-CM | POA: Diagnosis not present

## 2016-08-08 NOTE — Therapy (Signed)
[facility] [address], [place], [name], [address] Phone: [phone]   Fax:  [phone]  Physical Therapy Treatment  Patient Details  Name: [name] [name] MRN: [id number] Date of Birth: [birth year] Referring Provider: Jamse Arn, MD  Encounter Date: 08/08/2016      PT End of Session - 08/08/16 0843    Visit Number 20   Number of Visits 26   Date for PT Re-Evaluation 2016-08-25   Authorization Type g codes at 07/22/22 visit, KX needed    PT Start Time 0843   PT Stop Time 0926   PT Time Calculation (min) 43 min   Activity Tolerance Patient tolerated treatment well   Behavior During Therapy [facility] for tasks assessed/performed      Past Medical History:  Diagnosis Date  . Allergic rhinoconjunctivitis   . Anxiety    pt. managed- uses deep breathing   . Arthritis    low back , stenosis  . COLONIC POLYPS, RECURRENT 08/29/2006   2008 last colonoscopy. No further colonoscopy.     Marland Kitchen COPD (chronic obstructive pulmonary disease) ([place])   . Diverticulosis   . DVT (deep venous thrombosis) ([place])   . Eczema   . Environmental allergies    allergy shot- q friday in Dr. Janee Morn office. PFT's abnormal- recommended Spiriva to use preop & will d/c after surgery  . GERD (gastroesophageal reflux disease)   . H/O hiatal hernia   . History of colonic polyps   . Hyperlipidemia   . Hypertension   . Lung nodule 2011  . Peripheral vascular disease ([place])   . Shingles   . Stenosis of popliteal artery (HCC)    blood clots in legs long ago    . Trigeminal neuralgia     Past Surgical History:  Procedure Laterality Date  . ABDOMINAL AORTAGRAM N/A 06/17/2011   Procedure: ABDOMINAL Maxcine Ham;  Surgeon: Elam Dutch, MD;  Location: [facility] CATH LAB;  Service: Cardiovascular;  Laterality: N/A;  . CHOLECYSTECTOMY  1998  . EYE SURGERY     cataracts removed- bilateral /w IOL  . FEMORAL-POPLITEAL BYPASS GRAFT        x2 surgeries 1990's  & 2009  . INJECTION KNEE Right Aug. 2016   Gel injection for pain  . LUMBAR FUSION  07/06/2011  . TONSILLECTOMY     as a teenager   . TUBAL LIGATION      There were no vitals filed for this visit.      Subjective Assessment - 08/08/16 0844    Subjective Continues to improve! I can do more and more every day.    Currently in Pain? No/denies   Multiple Pain Sites No                         [facility] Adult PT Treatment/Exercise - 08/08/16 0001      Lumbar Exercises: Stretches   Standing Side Bend 5 reps  2 sec hold: VC to keep LT foot down and stretch gentle bil     Lumbar Exercises: Aerobic   Stationary Bike NuStep: Lvl 2, 10 min      Lumbar Exercises: Standing   Other Standing Lumbar Exercises Weight shifting on mini tramp 3 ways 1 min   Other Standing Lumbar Exercises Step ups alternating 15x,   light UE support     Lumbar Exercises: Seated   Long Arc Quad on Chair Strengthening;Both;Weights;15 reps;1 set   LAQ on [employer] (  lbs) 5   Sit to Stand 20 reps  Green band seated clamshells 3x 10 with abs contracted   Sit to Stand Limitations Held 2# wts in her hands for ex                  PT Short Term Goals - 06/15/16 0936      PT SHORT TERM GOAL #1   Title independent with initial HEP   Time 4   Period Weeks   Status Achieved     PT SHORT TERM GOAL #2   Title improved upright posture in standing due to increased hip extension and increased glute strength   Time 4   Period Weeks   Status Achieved     PT SHORT TERM GOAL #3   Title able to stand for 20 minutes due to increased strength and endurance   Time 4   Period Weeks   Status Achieved           PT Long Term Goals - 08/08/16 5916      PT LONG TERM GOAL #6   Title Pt will be able to spend 30 minutes a day on doing housework so that she can maintain a healthy environment   Time 6   Period Weeks   Status Achieved               Plan - 08/08/16 0843    Clinical  Impression Statement Pt reports she continues to feel improvements each day. She is compliant with all her exercises to this day and reports she is now fitting into smaller pants.    Rehab Potential Excellent   Clinical Impairments Affecting Rehab Potential severe arthritis Rt knee, history of abdominal surgery, history of DVT   PT Frequency 1x / week   PT Duration 6 weeks   PT Treatment/Interventions Cryotherapy;Electrical Stimulation;Moist Heat;Therapeutic activities;Therapeutic exercise;Balance training;Neuromuscular re-education;Patient/family education;Manual techniques;Biofeedback;Passive range of motion;Taping;Dry needling;Canalith Repostioning   Consulted and Agree with Plan of Care Patient      Patient will benefit from skilled therapeutic intervention in order to improve the following deficits and impairments:  Abnormal gait, Decreased activity tolerance, Decreased balance, Decreased endurance, Decreased range of motion, Difficulty walking, Decreased strength, Increased muscle spasms, Pain  Visit Diagnosis: Other abnormalities of gait and mobility  Muscle weakness (generalized)     Problem List Patient Active Problem List   Diagnosis Date Noted  . Unilateral primary osteoarthritis, right knee 04/07/2016  . Acute gout of left foot 03/22/2016  . Pain in joint, lower leg 08/28/2014  . Trigeminal neuralgia   . Encounter for therapeutic drug monitoring 10/21/2013  . Syncope 12/29/2011  . Chronic low back pain 09/13/2010  . Allergic rhinitis due to pollen 06/25/2010  . COPD mixed type ([place]) 01/22/2010  . Solitary pulmonary nodule 01/12/2010  . Hyperglycemia 01/06/2009  . Osteopenia 01/06/2009  . Eczema 11/26/2007  . Peripheral arterial disease ([place]) 06/29/2007  . Hyperlipidemia 10/23/2006  . Essential hypertension 10/23/2006  . ESOPHAGEAL STRICTURE 08/09/1993    Snigdha Howser, PTA 08/08/2016, 9:12 AM  [facility] [address], [place], [name], [address] Phone: [phone]   Fax:  [phone]  Name: [name] [name] MRN: [id number] Date of Birth: 25-Apr-1925

## 2016-08-10 ENCOUNTER — Encounter: Payer: Medicare Other | Admitting: Physical Therapy

## 2016-08-12 ENCOUNTER — Ambulatory Visit (INDEPENDENT_AMBULATORY_CARE_PROVIDER_SITE_OTHER): Payer: Medicare Other | Admitting: General Practice

## 2016-08-12 DIAGNOSIS — I749 Embolism and thrombosis of unspecified artery: Secondary | ICD-10-CM | POA: Diagnosis not present

## 2016-08-12 DIAGNOSIS — Z5181 Encounter for therapeutic drug level monitoring: Secondary | ICD-10-CM

## 2016-08-12 LAB — POCT INR: INR: 2.7

## 2016-08-12 NOTE — Patient Instructions (Signed)
Pre visit review using our clinic review tool, if applicable. No additional management support is needed unless otherwise documented below in the visit note. 

## 2016-08-12 NOTE — Progress Notes (Signed)
I have reviewed and agree with the plan. 

## 2016-08-17 ENCOUNTER — Ambulatory Visit: Payer: Medicare Other | Admitting: Physical Therapy

## 2016-08-17 ENCOUNTER — Encounter: Payer: Self-pay | Admitting: Physical Therapy

## 2016-08-17 DIAGNOSIS — M6281 Muscle weakness (generalized): Secondary | ICD-10-CM

## 2016-08-17 DIAGNOSIS — R2689 Other abnormalities of gait and mobility: Secondary | ICD-10-CM

## 2016-08-17 NOTE — Therapy (Signed)
[facility] [address], [place], [name], [address] Phone: [phone]   Fax:  [phone]  Physical Therapy Treatment  Patient Details  Name: [name] [name] MRN: [id number] Date of Birth: 1924/08/22 Referring Provider: Jamse Arn, MD  Encounter Date: 08/03/2016      PT End of Session - 08/17/16 0935    Visit Number 21   Number of Visits 26   Date for PT Re-Evaluation 09-09-16   Authorization Type g codes at 06-07-22 visit, KX needed    PT Start Time 0925   PT Stop Time 1008   PT Time Calculation (min) 43 min   Activity Tolerance Patient tolerated treatment well   Behavior During Therapy [facility] for tasks assessed/performed      Past Medical History:  Diagnosis Date  . Allergic rhinoconjunctivitis   . Anxiety    pt. managed- uses deep breathing   . Arthritis    low back , stenosis  . COLONIC POLYPS, RECURRENT 08/29/2006   2008 last colonoscopy. No further colonoscopy.     Marland Kitchen COPD (chronic obstructive pulmonary disease) ([place])   . Diverticulosis   . DVT (deep venous thrombosis) ([place])   . Eczema   . Environmental allergies    allergy shot- q friday in Dr. Janee Morn office. PFT's abnormal- recommended Spiriva to use preop & will d/c after surgery  . GERD (gastroesophageal reflux disease)   . H/O hiatal hernia   . History of colonic polyps   . Hyperlipidemia   . Hypertension   . Lung nodule 2011  . Peripheral vascular disease ([place])   . Shingles   . Stenosis of popliteal artery (HCC)    blood clots in legs long ago    . Trigeminal neuralgia     Past Surgical History:  Procedure Laterality Date  . ABDOMINAL AORTAGRAM N/A 06/17/2011   Procedure: ABDOMINAL Maxcine Ham;  Surgeon: Elam Dutch, MD;  Location: [facility] CATH LAB;  Service: Cardiovascular;  Laterality: N/A;  . CHOLECYSTECTOMY  1998  . EYE SURGERY     cataracts removed- bilateral /w IOL  . FEMORAL-POPLITEAL BYPASS GRAFT        x2 surgeries 1990's  & 2009  . INJECTION KNEE Right Aug. 2016   Gel injection for pain  . LUMBAR FUSION  07/06/2011  . TONSILLECTOMY     as a teenager   . TUBAL LIGATION      There were no vitals filed for this visit.      Subjective Assessment - 08/17/16 0937    Subjective It is raining and I feel no pain!   Currently in Pain? No/denies   Multiple Pain Sites No            [facility] PT Assessment - 08/17/16 0001      Observation/Other Assessments   Focus on Therapeutic Outcomes (FOTO)  44% limited CK all categories     Strength   Overall Strength Comments Bil LE grossly 4+/5 to 5/5                     [facility] Adult PT Treatment/Exercise - 08/17/16 0001      Lumbar Exercises: Aerobic   Stationary Bike NuStep: Lvl 2, 10 min      Lumbar Exercises: Seated   Long Arc Quad on Chair Strengthening;Both;2 sets;15 reps   LAQ on Chair Weights (lbs) 5   Sit to Stand 20 reps  Green band seated clamshells 3x 10 with abs contracted  Sit to Stand Limitations Held 2# wts in her hands for ex                  PT Short Term Goals - 06/15/16 0936      PT SHORT TERM GOAL #1   Title independent with initial HEP   Time 4   Period Weeks   Status Achieved     PT SHORT TERM GOAL #2   Title improved upright posture in standing due to increased hip extension and increased glute strength   Time 4   Period Weeks   Status Achieved     PT SHORT TERM GOAL #3   Title able to stand for 20 minutes due to increased strength and endurance   Time 4   Period Weeks   Status Achieved           PT Long Term Goals - 08/31/2016 6160      PT LONG TERM GOAL #1   Title independent with advanced HEP   Time 6   Period Weeks   Status Achieved     PT LONG TERM GOAL #3   Title Pt will be able to stand for at least 30 minutes in order to prepare meal   Time 8   Period Weeks   Status Achieved     PT LONG TERM GOAL #4   Title Pt will be able to do grocery shopping without feeling like her legs  wanting to give out   Time 8   Period Weeks   Status Achieved     PT LONG TERM GOAL #5   Title Improve TUG to <14sec to demonstrate decreased risk of falls   Baseline 11 sec   Time 8   Period Weeks   Status Achieved     PT LONG TERM GOAL #6   Title Pt will be able to spend 30 minutes a day on doing housework so that she can maintain a healthy environment   Time 6   Period Weeks   Status Achieved               Plan - 08-31-16 0944    Clinical Impression Statement Pt has met all long term goals. She reports she consistently has little to no pain and would like to proceed with discharge today to her HEP.  Pt reports her walking is faster and  does not favor her RTLE anymore.   Rehab Potential Excellent   Clinical Impairments Affecting Rehab Potential severe arthritis Rt knee, history of abdominal surgery, history of DVT   PT Frequency 1x / week   PT Duration 6 weeks   PT Treatment/Interventions Cryotherapy;Electrical Stimulation;Moist Heat;Therapeutic activities;Therapeutic exercise;Balance training;Neuromuscular re-education;Patient/family education;Manual techniques;Biofeedback;Passive range of motion;Taping;Dry needling;Canalith Repostioning   PT Next Visit Plan Discharge to HEP   Consulted and Agree with Plan of Care Patient      Patient will benefit from skilled therapeutic intervention in order to improve the following deficits and impairments:  Abnormal gait, Decreased activity tolerance, Decreased balance, Decreased endurance, Decreased range of motion, Difficulty walking, Decreased strength, Increased muscle spasms, Pain  Visit Diagnosis: Other abnormalities of gait and mobility  Muscle weakness (generalized)       G-Codes - 2016-08-31 1211    Functional Assessment Tool Used (Outpatient Only) FOTO, TUG, 5 x sit to stand, clnical judgement   Functional Limitation Mobility: Walking and moving around   Mobility: Walking and Moving Around Current Status ([id number]) At  least 40 percent but less  than 60 percent impaired, limited or restricted   Mobility: Walking and Moving Around Goal Status [phone]) At least 40 percent but less than 60 percent impaired, limited or restricted   Mobility: Walking and Moving Around Discharge Status [phone]) At least 40 percent but less than 60 percent impaired, limited or restricted      Problem List Patient Active Problem List   Diagnosis Date Noted  . Unilateral primary osteoarthritis, right knee 04/07/2016  . Acute gout of left foot 03/22/2016  . Pain in joint, lower leg 08/28/2014  . Trigeminal neuralgia   . Encounter for therapeutic drug monitoring 10/21/2013  . Syncope 12/29/2011  . Chronic low back pain 09/13/2010  . Allergic rhinitis due to pollen 06/25/2010  . COPD mixed type ([place]) 01/22/2010  . Solitary pulmonary nodule 01/12/2010  . Hyperglycemia 01/06/2009  . Osteopenia 01/06/2009  . Eczema 11/26/2007  . Peripheral arterial disease ([place]) 06/29/2007  . Hyperlipidemia 10/23/2006  . Essential hypertension 10/23/2006  . ESOPHAGEAL STRICTURE 08/09/1993    COCHRAN,JENNIFER, PTA 08/17/2016, 2:42 PM  [facility] [address], [place], [name], [address] Phone: [phone]   Fax:  [phone]  Name: AZILEE PIRRO MRN: [id number] Date of Birth: [birth year]

## 2016-08-17 NOTE — Therapy (Addendum)
[facility] [address] [place], [name], [address] Phone: [phone]   Fax:  [phone]  Physical Therapy Treatment  Patient Details  Name: [name] [name] MRN: [id number] Date of Birth: 04-09-1925 Referring Provider: Jamse Arn, MD  Encounter Date: 08/17/2016      PT End of Session - 08/17/16 0935    Visit Number 21   Number of Visits 26   Date for PT Re-Evaluation 09/01/16   Authorization Type g codes at 07-29-2022 visit, KX needed    PT Start Time 0925   PT Stop Time 1008   PT Time Calculation (min) 43 min   Activity Tolerance Patient tolerated treatment well   Behavior During Therapy [facility] for tasks assessed/performed      Past Medical History:  Diagnosis Date  . Allergic rhinoconjunctivitis   . Anxiety    pt. managed- uses deep breathing   . Arthritis    low back , stenosis  . COLONIC POLYPS, RECURRENT 08/29/2006   2008 last colonoscopy. No further colonoscopy.     Marland Kitchen COPD (chronic obstructive pulmonary disease) ([place])   . Diverticulosis   . DVT (deep venous thrombosis) ([place])   . Eczema   . Environmental allergies    allergy shot- q friday in Dr. Janee Morn office. PFT's abnormal- recommended Spiriva to use preop & will d/c after surgery  . GERD (gastroesophageal reflux disease)   . H/O hiatal hernia   . History of colonic polyps   . Hyperlipidemia   . Hypertension   . Lung nodule 2011  . Peripheral vascular disease ([place])   . Shingles   . Stenosis of popliteal artery (HCC)    blood clots in legs long ago    . Trigeminal neuralgia     Past Surgical History:  Procedure Laterality Date  . ABDOMINAL AORTAGRAM N/A 06/17/2011   Procedure: ABDOMINAL Maxcine Ham;  Surgeon: Elam Dutch, MD;  Location: [facility] CATH LAB;  Service: Cardiovascular;  Laterality: N/A;  . CHOLECYSTECTOMY  1998  . EYE SURGERY     cataracts removed- bilateral /w IOL  . FEMORAL-POPLITEAL BYPASS GRAFT        x2 surgeries 1990's  & 2009  . INJECTION KNEE Right Aug. 2016   Gel injection for pain  . LUMBAR FUSION  07/06/2011  . TONSILLECTOMY     as a teenager   . TUBAL LIGATION      There were no vitals filed for this visit.      Subjective Assessment - 08/17/16 0937    Subjective It is raining and I feel no pain!   Currently in Pain? No/denies   Multiple Pain Sites No            [facility] PT Assessment - 08/17/16 0001      Observation/Other Assessments   Focus on Therapeutic Outcomes (FOTO)  44% limited CK all categories     Strength   Overall Strength Comments Bil LE grossly 4+/5 to 5/5                     [facility] Adult PT Treatment/Exercise - 08/17/16 0001      Lumbar Exercises: Aerobic   Stationary Bike NuStep: Lvl 2, 10 min      Lumbar Exercises: Seated   Long Arc Quad on Chair Strengthening;Both;2 sets;15 reps   LAQ on Chair Weights (lbs) 5   Sit to Stand 20 reps  Green band seated clamshells 3x 10 with abs contracted  Sit to Stand Limitations Held 2# wts in her hands for ex                  PT Short Term Goals - 06/15/16 0936      PT SHORT TERM GOAL #1   Title independent with initial HEP   Time 4   Period Weeks   Status Achieved     PT SHORT TERM GOAL #2   Title improved upright posture in standing due to increased hip extension and increased glute strength   Time 4   Period Weeks   Status Achieved     PT SHORT TERM GOAL #3   Title able to stand for 20 minutes due to increased strength and endurance   Time 4   Period Weeks   Status Achieved           PT Long Term Goals - 08/31/2016 6160      PT LONG TERM GOAL #1   Title independent with advanced HEP   Time 6   Period Weeks   Status Achieved     PT LONG TERM GOAL #3   Title Pt will be able to stand for at least 30 minutes in order to prepare meal   Time 8   Period Weeks   Status Achieved     PT LONG TERM GOAL #4   Title Pt will be able to do grocery shopping without feeling like her legs  wanting to give out   Time 8   Period Weeks   Status Achieved     PT LONG TERM GOAL #5   Title Improve TUG to <14sec to demonstrate decreased risk of falls   Baseline 11 sec   Time 8   Period Weeks   Status Achieved     PT LONG TERM GOAL #6   Title Pt will be able to spend 30 minutes a day on doing housework so that she can maintain a healthy environment   Time 6   Period Weeks   Status Achieved               Plan - 08-31-16 0944    Clinical Impression Statement Pt has met all long term goals. She reports she consistently has little to no pain and would like to proceed with discharge today to her HEP.  Pt reports her walking is faster and  does not favor her RTLE anymore.   Rehab Potential Excellent   Clinical Impairments Affecting Rehab Potential severe arthritis Rt knee, history of abdominal surgery, history of DVT   PT Frequency 1x / week   PT Duration 6 weeks   PT Treatment/Interventions Cryotherapy;Electrical Stimulation;Moist Heat;Therapeutic activities;Therapeutic exercise;Balance training;Neuromuscular re-education;Patient/family education;Manual techniques;Biofeedback;Passive range of motion;Taping;Dry needling;Canalith Repostioning   PT Next Visit Plan Discharge to HEP   Consulted and Agree with Plan of Care Patient      Patient will benefit from skilled therapeutic intervention in order to improve the following deficits and impairments:  Abnormal gait, Decreased activity tolerance, Decreased balance, Decreased endurance, Decreased range of motion, Difficulty walking, Decreased strength, Increased muscle spasms, Pain  Visit Diagnosis: Other abnormalities of gait and mobility  Muscle weakness (generalized)       G-Codes - 2016-08-31 1211    Functional Assessment Tool Used (Outpatient Only) FOTO, TUG, 5 x sit to stand, clnical judgement   Functional Limitation Mobility: Walking and moving around   Mobility: Walking and Moving Around Current Status ([id number]) At  least 40 percent but less  than 60 percent impaired, limited or restricted   Mobility: Walking and Moving Around Goal Status [phone]) At least 40 percent but less than 60 percent impaired, limited or restricted   Mobility: Walking and Moving Around Discharge Status [phone]) At least 40 percent but less than 60 percent impaired, limited or restricted      Problem List Patient Active Problem List   Diagnosis Date Noted  . Unilateral primary osteoarthritis, right knee 04/07/2016  . Acute gout of left foot 03/22/2016  . Pain in joint, lower leg 08/28/2014  . Trigeminal neuralgia   . Encounter for therapeutic drug monitoring 10/21/2013  . Syncope 12/29/2011  . Chronic low back pain 09/13/2010  . Allergic rhinitis due to pollen 06/25/2010  . COPD mixed type ([place]) 01/22/2010  . Solitary pulmonary nodule 01/12/2010  . Hyperglycemia 01/06/2009  . Osteopenia 01/06/2009  . Eczema 11/26/2007  . Peripheral arterial disease ([place]) 06/29/2007  . Hyperlipidemia 10/23/2006  . Essential hypertension 10/23/2006  . ESOPHAGEAL STRICTURE 08/09/1993    Zannie Cove, PT 08/17/16 12:13 PM  [facility] [address], [place], [name], [address] Phone: [phone]   Fax:  [phone]  Name: [name] [name] MRN: [id number] Date of Birth: November 18, 1924  PHYSICAL THERAPY DISCHARGE SUMMARY  Visits from Start of Care: 21  Current functional level related to goals / functional outcomes: See above current goal status   Remaining deficits: See above   Education / Equipment: HEP Plan: Patient agrees to discharge.  Patient goals were met. Patient is being discharged due to meeting the stated rehab goals.  ?????

## 2016-08-22 ENCOUNTER — Telehealth (INDEPENDENT_AMBULATORY_CARE_PROVIDER_SITE_OTHER): Payer: Self-pay | Admitting: Orthopaedic Surgery

## 2016-08-22 NOTE — Telephone Encounter (Signed)
Pt requests another Synvisc inj please advise  [phone]

## 2016-08-22 NOTE — Telephone Encounter (Signed)
yes

## 2016-08-22 NOTE — Telephone Encounter (Signed)
Please advise. Is this okay to submit application?

## 2016-08-23 NOTE — Telephone Encounter (Signed)
Application done pending ins

## 2016-08-24 ENCOUNTER — Encounter: Payer: Medicare Other | Admitting: Physical Therapy

## 2016-08-30 ENCOUNTER — Ambulatory Visit (INDEPENDENT_AMBULATORY_CARE_PROVIDER_SITE_OTHER): Payer: Medicare Other | Admitting: Orthopaedic Surgery

## 2016-08-30 ENCOUNTER — Encounter (INDEPENDENT_AMBULATORY_CARE_PROVIDER_SITE_OTHER): Payer: Self-pay | Admitting: Orthopaedic Surgery

## 2016-08-30 DIAGNOSIS — M1711 Unilateral primary osteoarthritis, right knee: Secondary | ICD-10-CM | POA: Diagnosis not present

## 2016-08-30 MED ORDER — HYALURONAN 88 MG/4ML IX SOSY
88.0000 mg | PREFILLED_SYRINGE | INTRA_ARTICULAR | Status: AC | PRN
  Administered 2016-08-30: 88 mg via INTRA_ARTICULAR

## 2016-08-30 NOTE — Progress Notes (Signed)
   Procedure Note  Patient: [name] [name]             Date of Birth: 04/02/1925           MRN: [id number]             Visit Date: 08/30/2016  Procedures: Visit Diagnoses: Unilateral primary osteoarthritis, right knee  Large Joint Inj Date/Time: 08/30/2016 1:08 PM Performed by: Leandrew Koyanagi Authorized by: Leandrew Koyanagi   Consent Given by:  Patient Timeout: prior to procedure the correct patient, procedure, and site was verified   Indications:  Pain Location:  Knee Site:  R knee Prep: patient was prepped and draped in usual sterile fashion   Needle Size:  22 G Approach:  Anterolateral Ultrasound Guidance: No   Fluoroscopic Guidance: No   Arthrogram: No   Medications:  88 mg Hyaluronan 88 MG/4ML

## 2016-09-09 ENCOUNTER — Ambulatory Visit (INDEPENDENT_AMBULATORY_CARE_PROVIDER_SITE_OTHER): Payer: Medicare Other | Admitting: General Practice

## 2016-09-09 DIAGNOSIS — Z5181 Encounter for therapeutic drug level monitoring: Secondary | ICD-10-CM

## 2016-09-09 LAB — POCT INR: INR: 3.2

## 2016-09-09 NOTE — Patient Instructions (Signed)
Pre visit review using our clinic review tool, if applicable. No additional management support is needed unless otherwise documented below in the visit note. 

## 2016-09-10 NOTE — Progress Notes (Signed)
I have reviewed and agree with the plan. 

## 2016-10-01 ENCOUNTER — Other Ambulatory Visit: Payer: Self-pay | Admitting: Family Medicine

## 2016-10-07 ENCOUNTER — Ambulatory Visit (INDEPENDENT_AMBULATORY_CARE_PROVIDER_SITE_OTHER): Payer: Medicare Other | Admitting: General Practice

## 2016-10-07 DIAGNOSIS — Z5181 Encounter for therapeutic drug level monitoring: Secondary | ICD-10-CM

## 2016-10-07 LAB — POCT INR: INR: 2.2

## 2016-10-07 NOTE — Patient Instructions (Signed)
Pre visit review using our clinic review tool, if applicable. No additional management support is needed unless otherwise documented below in the visit note. 

## 2016-10-18 ENCOUNTER — Ambulatory Visit (INDEPENDENT_AMBULATORY_CARE_PROVIDER_SITE_OTHER): Payer: Medicare Other | Admitting: Family Medicine

## 2016-10-18 ENCOUNTER — Encounter: Payer: Self-pay | Admitting: Family Medicine

## 2016-10-18 VITALS — BP 140/62 | HR 76 | Temp 97.9°F | Ht 60.0 in | Wt 173.6 lb

## 2016-10-18 DIAGNOSIS — I779 Disorder of arteries and arterioles, unspecified: Secondary | ICD-10-CM | POA: Diagnosis not present

## 2016-10-18 DIAGNOSIS — R197 Diarrhea, unspecified: Secondary | ICD-10-CM | POA: Diagnosis not present

## 2016-10-18 NOTE — Progress Notes (Signed)
Subjective:  [name] [name] is a 83 y.o. year old very pleasant female patient who presents for/with See problem oriented charting ROS- has had diarrhea and some nausea. No fever. No blood or mucus in stool.    Past Medical History-  Patient Active Problem List   Diagnosis Date Noted  . Pain in joint, lower leg 08/28/2014    Priority: High  . Peripheral arterial disease ([place]) 06/29/2007    Priority: High  . Acute gout of left foot 03/22/2016    Priority: Medium  . Trigeminal neuralgia     Priority: Medium  . Chronic low back pain 09/13/2010    Priority: Medium  . COPD mixed type ([place]) 01/22/2010    Priority: Medium  . Hyperglycemia 01/06/2009    Priority: Medium  . Hyperlipidemia 10/23/2006    Priority: Medium  . Essential hypertension 10/23/2006    Priority: Medium  . ESOPHAGEAL STRICTURE 08/09/1993    Priority: Medium  . Encounter for therapeutic drug monitoring 10/21/2013    Priority: Low  . Syncope 12/29/2011    Priority: Low  . Allergic rhinitis due to pollen 06/25/2010    Priority: Low  . Solitary pulmonary nodule 01/12/2010    Priority: Low  . Osteopenia 01/06/2009    Priority: Low  . Eczema 11/26/2007    Priority: Low  . Unilateral primary osteoarthritis, right knee 04/07/2016    Medications- reviewed and updated Current Outpatient Prescriptions  Medication Sig Dispense Refill  . acetaminophen (TYLENOL) 500 MG tablet Take 500-1,500 mg by mouth 3 (three) times daily as needed for moderate pain. Reported on 08/31/2015    . aspirin EC 81 MG tablet Take 81 mg by mouth daily.    . calcium-vitamin D (OSCAL WITH D 500-200) 500-200 MG-UNIT per tablet Take 1 tablet by mouth daily.      . Cholecalciferol (VITAMIN D3) 1000 UNITS CAPS Take 1,000 Units by mouth daily.     . colchicine (COLCRYS) 0.6 MG tablet Take 2 pills at first sign gout flare, then another pill 2 hours later if still having pain. Then take once daily until pain resolves. 30 tablet 0  . DULoxetine  (CYMBALTA) 60 MG capsule Take 1 capsule (60 mg total) by mouth daily. 30 capsule 1  . methocarbamol (ROBAXIN) 500 MG tablet Take 1 tablet (500 mg total) by mouth 2 (two) times daily as needed for muscle spasms. 60 tablet 1  . omeprazole (PRILOSEC) 20 MG capsule Take 20 mg by mouth every morning.     . simvastatin (ZOCOR) 40 MG tablet TAKE ONE TABLET BY MOUTH ONCE DAILY AT 6 IN THE EVENING 90 tablet 2  . valsartan-hydrochlorothiazide (DIOVAN-HCT) 320-25 MG tablet TAKE ONE TABLET BY MOUTH ONCE DAILY 90 tablet 1  . warfarin (COUMADIN) 5 MG tablet TAKE TWO AND ONE-HALF TABLETS BY MOUTH ON THURSDAY, AND THEN TAKE TWO TABLETS THE REST OF THE DAYS (Patient taking differently: TAKE 1 1/2 TABLETS BY MOUTH ON MONDAYS, AND THEN TAKE ONE TABLET ALL OTHER DAYS.) 180 tablet 1   No current facility-administered medications for this visit.     Objective: BP 140/62 (BP Location: Left Arm, Patient Position: Sitting, Cuff Size: Normal)   Pulse 76   Temp 97.9 F (36.6 C) (Oral)   Ht 5' (1.524 m)   Wt 173 lb 9.6 oz (78.7 kg)   SpO2 96%   BMI 33.90 kg/m  Gen: NAD, resting comfortably Mildly dry mucus membrane CV: RRR no murmurs rubs or gallops Lungs: CTAB no crackles, wheeze, rhonchi  Abdomen: soft/very mild tenderness across upper abdomen/nondistended/normal bowel sounds. No rebound or guarding.  Ext: no edema Skin: warm, dry, no rash  Assessment/Plan:  Diarrhea of presumed infectious origin - Plan: Fecal lactoferrin, quant, Stool culture S: 6 days of diarrhea. Watery up to 10 bowel movements a day. She has been eating ok- toast, bananas, soup and pushing water but has still felt some nausea and fatigue. Started slowing down in last few days with only 2-3 BM yesterday and 0 so far today! Initially had some sweats and now resolved. Denies any recent antibiotics.  A/P: viral gastroenteritis improving. If has more diarrhea- she will collect stool sample (comeback to get kit for consideration of antibiotic  use). We discussed risks of future C. Diff if we go ahead and use antibiotics or diarrhea from antibiotics themselves and she opted not to try ciprofloxacin.  Patient Instructions  I suspect this is a viral gastroenteritis (stomach bug) and antibiotics do not help this unfortunately.   Time, good hydration, and rest are the most important things  If you are still having symptoms tomorrow- I want you to collect stool and bring it by so we can look for bacteria. Stop by lab for the collection kit.   You are still contagious for at least 24 hours after your last episode of diarrhea so if you dont have any diarrhea the rest of the day you should be ok to go out tomorrow.   To stop the diarrhea- you can take imodium if it recurs (can buy this over the counter)  Orders Placed This Encounter  Procedures  . Stool culture    Standing Status:   Future    Standing Expiration Date:   11/02/2016  . Fecal lactoferrin, quant    Standing Status:   Future    Standing Expiration Date:   11/02/2016   Return precautions advised.  [name], MD

## 2016-10-18 NOTE — Patient Instructions (Signed)
I suspect this is a viral gastroenteritis (stomach bug) and antibiotics do not help this unfortunately.   Time, good hydration, and rest are the most important things  If you are still having symptoms tomorrow- I want you to collect stool and bring it by so we can look for bacteria. Stop by lab for the collection kit.   You are still contagious for at least 24 hours after your last episode of diarrhea so if you dont have any diarrhea the rest of the day you should be ok to go out tomorrow.   To stop the diarrhea- you can take imodium if it recurs (can buy this over the counter)

## 2016-10-27 ENCOUNTER — Encounter: Payer: Medicare Other | Attending: Physical Medicine & Rehabilitation | Admitting: Physical Medicine & Rehabilitation

## 2016-10-27 DIAGNOSIS — Z5181 Encounter for therapeutic drug level monitoring: Secondary | ICD-10-CM | POA: Insufficient documentation

## 2016-10-27 DIAGNOSIS — Z8249 Family history of ischemic heart disease and other diseases of the circulatory system: Secondary | ICD-10-CM | POA: Insufficient documentation

## 2016-10-27 DIAGNOSIS — R269 Unspecified abnormalities of gait and mobility: Secondary | ICD-10-CM | POA: Insufficient documentation

## 2016-10-27 DIAGNOSIS — Z8371 Family history of colonic polyps: Secondary | ICD-10-CM | POA: Insufficient documentation

## 2016-10-27 DIAGNOSIS — Z8261 Family history of arthritis: Secondary | ICD-10-CM | POA: Insufficient documentation

## 2016-10-27 DIAGNOSIS — Z803 Family history of malignant neoplasm of breast: Secondary | ICD-10-CM | POA: Insufficient documentation

## 2016-10-27 DIAGNOSIS — M1711 Unilateral primary osteoarthritis, right knee: Secondary | ICD-10-CM | POA: Insufficient documentation

## 2016-10-27 DIAGNOSIS — Z86010 Personal history of colonic polyps: Secondary | ICD-10-CM | POA: Insufficient documentation

## 2016-10-27 DIAGNOSIS — I10 Essential (primary) hypertension: Secondary | ICD-10-CM | POA: Insufficient documentation

## 2016-10-27 DIAGNOSIS — M961 Postlaminectomy syndrome, not elsewhere classified: Secondary | ICD-10-CM | POA: Insufficient documentation

## 2016-10-27 DIAGNOSIS — Z9851 Tubal ligation status: Secondary | ICD-10-CM | POA: Insufficient documentation

## 2016-10-27 DIAGNOSIS — M792 Neuralgia and neuritis, unspecified: Secondary | ICD-10-CM | POA: Insufficient documentation

## 2016-10-27 DIAGNOSIS — M533 Sacrococcygeal disorders, not elsewhere classified: Secondary | ICD-10-CM | POA: Insufficient documentation

## 2016-10-27 DIAGNOSIS — M109 Gout, unspecified: Secondary | ICD-10-CM | POA: Insufficient documentation

## 2016-10-27 DIAGNOSIS — J449 Chronic obstructive pulmonary disease, unspecified: Secondary | ICD-10-CM | POA: Insufficient documentation

## 2016-10-27 DIAGNOSIS — Z86718 Personal history of other venous thrombosis and embolism: Secondary | ICD-10-CM | POA: Insufficient documentation

## 2016-10-27 DIAGNOSIS — G8929 Other chronic pain: Secondary | ICD-10-CM | POA: Insufficient documentation

## 2016-10-27 DIAGNOSIS — Z981 Arthrodesis status: Secondary | ICD-10-CM | POA: Insufficient documentation

## 2016-10-27 DIAGNOSIS — Z9049 Acquired absence of other specified parts of digestive tract: Secondary | ICD-10-CM | POA: Insufficient documentation

## 2016-10-27 DIAGNOSIS — G500 Trigeminal neuralgia: Secondary | ICD-10-CM | POA: Insufficient documentation

## 2016-10-27 DIAGNOSIS — Z800 Family history of malignant neoplasm of digestive organs: Secondary | ICD-10-CM | POA: Insufficient documentation

## 2016-10-27 DIAGNOSIS — Z955 Presence of coronary angioplasty implant and graft: Secondary | ICD-10-CM | POA: Insufficient documentation

## 2016-10-27 DIAGNOSIS — Z801 Family history of malignant neoplasm of trachea, bronchus and lung: Secondary | ICD-10-CM | POA: Insufficient documentation

## 2016-10-27 DIAGNOSIS — K219 Gastro-esophageal reflux disease without esophagitis: Secondary | ICD-10-CM | POA: Insufficient documentation

## 2016-10-27 DIAGNOSIS — I739 Peripheral vascular disease, unspecified: Secondary | ICD-10-CM | POA: Insufficient documentation

## 2016-10-27 DIAGNOSIS — Z87891 Personal history of nicotine dependence: Secondary | ICD-10-CM | POA: Insufficient documentation

## 2016-10-27 DIAGNOSIS — G479 Sleep disorder, unspecified: Secondary | ICD-10-CM | POA: Insufficient documentation

## 2016-10-27 DIAGNOSIS — E785 Hyperlipidemia, unspecified: Secondary | ICD-10-CM | POA: Insufficient documentation

## 2016-10-28 NOTE — Progress Notes (Signed)
Subjective:   [name] [name] is a 83 y.o. female who presents for Medicare Annual (Subsequent) preventive examination.  Review of Systems:  No ROS.  Medicare Wellness Visit. Additional risk factors are reflected in the social history.  Cardiac Risk Factors include: advanced age (>15men, >25 women);hypertension;dyslipidemia;obesity (BMI >30kg/m2)  Sleep patterns: 7 hrs/night. Gets up once to urinate. Wakes up feeling rested. Home Safety/Smoke Alarms: Feels safe in home. Smoke alarms in place.  Living environment; residence and Firearm Safety: Lives alone in one story home.  Seat Belt Safety/Bike Helmet: Wears seat belt.   Counseling:   Dental-  Upper and lower dentures.  Female:   Pap-     Aged out.  Mammo-  05/04/2016  Negative.   Dexa scan-    04/26/2008. Pt taking Calcium. Pt does not believe she needs this done.    CCS-   04/03/2008. Occult card sent home.     Objective:     Vitals: BP (!) 140/58 (BP Location: Left Arm, Patient Position: Sitting, Cuff Size: Large)   Pulse 78   Resp 16   Ht 5' (1.524 m)   Wt 178 lb 1.6 oz (80.8 kg)   SpO2 98%   BMI 34.78 kg/m   Body mass index is 34.78 kg/m.   Tobacco History  Smoking Status  . Former Smoker  . Packs/day: 2.00  . Years: 30.00  . Types: Cigarettes  . Quit date: 06/18/1993  Smokeless Tobacco  . Never Used    Comment: [place]     Counseling given: Not Answered   Past Medical History:  Diagnosis Date  . Allergic rhinoconjunctivitis   . Anxiety    pt. managed- uses deep breathing   . Arthritis    low back , stenosis  . COLONIC POLYPS, RECURRENT 08/29/2006   2008 last colonoscopy. No further colonoscopy.     Marland Kitchen COPD (chronic obstructive pulmonary disease) ([place])   . Diverticulosis   . DVT (deep venous thrombosis) ([place])   . Eczema   . Environmental allergies    allergy shot- q friday in Dr. Janee Morn office. PFT's abnormal- recommended Spiriva to use preop & will d/c after surgery  . GERD  (gastroesophageal reflux disease)   . H/O hiatal hernia   . History of colonic polyps   . Hyperlipidemia   . Hypertension   . Lung nodule 2011  . Peripheral vascular disease ([place])   . Shingles   . Stenosis of popliteal artery (HCC)    blood clots in legs long ago    . Trigeminal neuralgia    Past Surgical History:  Procedure Laterality Date  . ABDOMINAL AORTAGRAM N/A 06/17/2011   Procedure: ABDOMINAL Maxcine Ham;  Surgeon: Elam Dutch, MD;  Location[facility] CATH LAB;  Service: Cardiovascular;  Laterality: N/A;  . CHOLECYSTECTOMY  1998  . EYE SURGERY     cataracts removed- bilateral /w IOL  . FEMORAL-POPLITEAL BYPASS GRAFT        x2 surgeries 1990's & 2009  . INJECTION KNEE Right Aug. 2016   Gel injection for pain  . LUMBAR FUSION  07/06/2011  . TONSILLECTOMY     as a teenager   . TUBAL LIGATION     Family History  Problem Relation Age of Onset  . Arthritis Mother   . Hypertension Mother   . Heart disease Father   . Colon polyps Father   . Breast cancer Sister   . Lung cancer Sister   . Colon cancer Sister   .  Cancer Sister        Breast  . Cancer Daughter        Breast  . Hypertension Son   . Lung cancer Brother   . Cancer Brother        Lung  . Anesthesia problems Neg Hx   . Hypotension Neg Hx   . Malignant hyperthermia Neg Hx   . Pseudochol deficiency Neg Hx    History  Sexual Activity  . Sexual activity: Not on file    Outpatient Encounter Prescriptions as of 10/31/2016  Medication Sig  . acetaminophen (TYLENOL) 500 MG tablet Take 500-1,500 mg by mouth 3 (three) times daily as needed for moderate pain. Reported on 08/31/2015  . aspirin EC 81 MG tablet Take 81 mg by mouth daily.  . calcium-vitamin D (OSCAL WITH D 500-200) 500-200 MG-UNIT per tablet Take 1 tablet by mouth daily.    . Cholecalciferol (VITAMIN D3) 1000 UNITS CAPS Take 1,000 Units by mouth daily.   . colchicine (COLCRYS) 0.6 MG tablet Take 2 pills at first sign gout flare, then another pill 2 hours  later if still having pain. Then take once daily until pain resolves.  . DULoxetine (CYMBALTA) 60 MG capsule Take 1 capsule (60 mg total) by mouth daily.  . methocarbamol (ROBAXIN) 500 MG tablet Take 1 tablet (500 mg total) by mouth 2 (two) times daily as needed for muscle spasms.  Marland Kitchen omeprazole (PRILOSEC) 20 MG capsule Take 20 mg by mouth every morning.   . simvastatin (ZOCOR) 40 MG tablet TAKE ONE TABLET BY MOUTH ONCE DAILY AT 6 IN THE EVENING  . valsartan-hydrochlorothiazide (DIOVAN-HCT) 320-25 MG tablet TAKE ONE TABLET BY MOUTH ONCE DAILY  . warfarin (COUMADIN) 5 MG tablet TAKE TWO AND ONE-HALF TABLETS BY MOUTH ON THURSDAY, AND THEN TAKE TWO TABLETS THE REST OF THE DAYS (Patient taking differently: TAKE 1 1/2 TABLETS BY MOUTH ON MONDAYS, AND THEN TAKE ONE TABLET ALL OTHER DAYS.)   No facility-administered encounter medications on file as of 10/31/2016.     Activities of Daily Living In your present state of health, do you have any difficulty performing the following activities: 10/31/2016  Hearing? N  Vision? N  Difficulty concentrating or making decisions? N  Walking or climbing stairs? N  Dressing or bathing? N  Doing errands, shopping? N  Preparing Food and eating ? N  Using the Toilet? N  In the past six months, have you accidently leaked urine? N  Do you have problems with loss of bowel control? N  Managing your Medications? N  Managing your Finances? N  Housekeeping or managing your Housekeeping? N  Some recent data might be hidden    Patient Care Team: Marin Olp, MD as PCP - General (Family Medicine) Leandrew Koyanagi, MD as Attending Physician (Orthopedic Surgery) Jamse Arn, MD as Consulting Physician (Physical Medicine and Rehabilitation)    Assessment:    Physical assessment deferred to PCP.  Exercise Activities and Dietary recommendations Current Exercise Habits: Home exercise routine, Type of exercise: stretching;Other - see comments (floor standing  bicycle exercises), Time (Minutes): 40, Frequency (Times/Week): 7, Weekly Exercise (Minutes/Week): 280, Intensity: Mild   Diet (meal preparation, eat out, water intake, caffeinated beverages, dairy products, fruits and vegetables):  Breakfast:  Cereal.  Lunch: Sandwich. Dinner:    Soup or crackers with cheese. Pt states "a lot of my meals are takeout." Pt states that she struggles cooking for herself.  Suggested using the crockpot to prepare healthier meals  at home.   Goals    None     Fall Risk Fall Risk  10/31/2016 08/04/2016 06/16/2016 05/05/2016 09/14/2015  Falls in the past year? Yes No No No No  Number falls in past yr: 1 - - - -  Injury with Fall? No - - - -  Follow up Education provided;Falls prevention discussed - - - -   Depression Screen PHQ 2/9 Scores 10/31/2016 06/16/2016 05/05/2016 09/14/2015  PHQ - 2 Score 0 0 0 0  PHQ- 9 Score - - 2 -     Cognitive Function   Ad8 score reviewed for issues:  Issues making decisions:no  Less interest in hobbies / activities:no  Repeats questions, stories (family complaining):no  Trouble using ordinary gadgets (microwave, computer, phone):no  Forgets the month or year: no  Mismanaging finances: no  Remembering appts:no  Daily problems with thinking and/or memory:no Ad8 score is=0      Immunization History  Administered Date(s) Administered  . Influenza Split 12/25/2011  . Influenza Whole 01/23/2010  . Influenza,inj,Quad PF,36+ Mos 12/31/2012, 12/26/2014, 01/08/2016  . Influenza-Unspecified 01/23/2014  . PPD Test 06/24/2011  . Pneumococcal Conjugate-13 08/31/2015  . Pneumococcal Polysaccharide-23 04/25/2005  . Td 04/25/2005  . Zoster 03/02/2010   Screening Tests Health Maintenance  Topic Date Due  . TETANUS/TDAP  04/26/2015  . INFLUENZA VACCINE  11/23/2016  . DEXA SCAN  Completed  . PNA vac Low Risk Adult  Completed      Plan:    Bring a copy of your advance directives to your next office visit.  Occult card  given to pt and she will bring back once completed.   Educated regarding Tdap and she will notify office if she needs it.  I have personally reviewed and noted the following in the patient's chart:   . Medical and social history . Use of alcohol, tobacco or illicit drugs  . Current medications and supplements . Functional ability and status . Nutritional status . Physical activity . Advanced directives . List of other physicians . Vitals . Screenings to include cognitive, depression, and falls . Referrals and appointments  In addition, I have reviewed and discussed with patient certain preventive protocols, quality metrics, and best practice recommendations. A written personalized care plan for preventive services as well as general preventive health recommendations were provided to patient.     Ree Edman, RN  10/31/2016

## 2016-10-28 NOTE — Progress Notes (Signed)
Pre visit review using our clinic review tool, if applicable. No additional management support is needed unless otherwise documented below in the visit note. 

## 2016-10-31 ENCOUNTER — Ambulatory Visit (INDEPENDENT_AMBULATORY_CARE_PROVIDER_SITE_OTHER): Payer: Medicare Other

## 2016-10-31 VITALS — BP 140/58 | HR 78 | Resp 16 | Ht 60.0 in | Wt 178.1 lb

## 2016-10-31 DIAGNOSIS — Z0000 Encounter for general adult medical examination without abnormal findings: Secondary | ICD-10-CM | POA: Diagnosis not present

## 2016-10-31 DIAGNOSIS — Z1211 Encounter for screening for malignant neoplasm of colon: Secondary | ICD-10-CM | POA: Diagnosis not present

## 2016-10-31 NOTE — Progress Notes (Signed)
I have reviewed and agree with note, evaluation, plan. Patient requested stool cards- she is worried about colon cancer possibility. Thought doing this test would be low risk.   [name], MD

## 2016-10-31 NOTE — Patient Instructions (Addendum)
Bring a copy of your advance directives to your next office visit.  Preventive Care 80 Years and Older, Female Preventive care refers to lifestyle choices and visits with your health care provider that can promote health and wellness. What does preventive care include?  A yearly physical exam. This is also called an annual well check.  Dental exams once or twice a year.  Routine eye exams. Ask your health care provider how often you should have your eyes checked.  Personal lifestyle choices, including: ? Daily care of your teeth and gums. ? Regular physical activity. ? Eating a healthy diet. ? Avoiding tobacco and drug use. ? Limiting alcohol use. ? Practicing safe sex. ? Taking low-dose aspirin every day. ? Taking vitamin and mineral supplements as recommended by your health care provider. What happens during an annual well check? The services and screenings done by your health care provider during your annual well check will depend on your age, overall health, lifestyle risk factors, and family history of disease. Counseling Your health care provider may ask you questions about your:  Alcohol use.  Tobacco use.  Drug use.  Emotional well-being.  Home and relationship well-being.  Sexual activity.  Eating habits.  History of falls.  Memory and ability to understand (cognition).  Work and work Statistician.  Reproductive health.  Screening You may have the following tests or measurements:  Height, weight, and BMI.  Blood pressure.  Lipid and cholesterol levels. These may be checked every 5 years, or more frequently if you are over 66 years old.  Skin check.  Lung cancer screening. You may have this screening every year starting at age 60 if you have a 30-pack-year history of smoking and currently smoke or have quit within the past 15 years.  Fecal occult blood test (FOBT) of the stool. You may have this test every year starting at age 57.  Flexible  sigmoidoscopy or colonoscopy. You may have a sigmoidoscopy every 5 years or a colonoscopy every 10 years starting at age 34.  Hepatitis C blood test.  Hepatitis B blood test.  Sexually transmitted disease (STD) testing.  Diabetes screening. This is done by checking your blood sugar (glucose) after you have not eaten for a while (fasting). You may have this done every 1-3 years.  Bone density scan. This is done to screen for osteoporosis. You may have this done starting at age 27.  Mammogram. This may be done every 1-2 years. Talk to your health care provider about how often you should have regular mammograms.  Talk with your health care provider about your test results, treatment options, and if necessary, the need for more tests. Vaccines Your health care provider may recommend certain vaccines, such as:  Influenza vaccine. This is recommended every year.  Tetanus, diphtheria, and acellular pertussis (Tdap, Td) vaccine. You may need a Td booster every 10 years.  Varicella vaccine. You may need this if you have not been vaccinated.  Zoster vaccine. You may need this after age 85.  Measles, mumps, and rubella (MMR) vaccine. You may need at least one dose of MMR if you were born in 1957 or later. You may also need a second dose.  Pneumococcal 13-valent conjugate (PCV13) vaccine. One dose is recommended after age 41.  Pneumococcal polysaccharide (PPSV23) vaccine. One dose is recommended after age 57.  Meningococcal vaccine. You may need this if you have certain conditions.  Hepatitis A vaccine. You may need this if you have certain conditions or if  you travel or work in places where you may be exposed to hepatitis A.  Hepatitis B vaccine. You may need this if you have certain conditions or if you travel or work in places where you may be exposed to hepatitis B.  Haemophilus influenzae type b (Hib) vaccine. You may need this if you have certain conditions.  Talk to your health  care provider about which screenings and vaccines you need and how often you need them. This information is not intended to replace advice given to you by your health care provider. Make sure you discuss any questions you have with your health care provider. Document Released: 05/08/2015 Document Revised: 12/30/2015 Document Reviewed: 02/10/2015 Elsevier Interactive Patient Education  2017 Elsevier Inc.  

## 2016-11-04 ENCOUNTER — Ambulatory Visit: Payer: Medicare Other

## 2016-11-11 ENCOUNTER — Ambulatory Visit (INDEPENDENT_AMBULATORY_CARE_PROVIDER_SITE_OTHER): Payer: Medicare Other | Admitting: General Practice

## 2016-11-11 DIAGNOSIS — Z5181 Encounter for therapeutic drug level monitoring: Secondary | ICD-10-CM | POA: Diagnosis not present

## 2016-11-11 LAB — POCT INR: INR: 4.6

## 2016-11-11 NOTE — Patient Instructions (Signed)
Pre visit review using our clinic review tool, if applicable. No additional management support is needed unless otherwise documented below in the visit note. 

## 2016-11-11 NOTE — Progress Notes (Signed)
I have reviewed and agree with the plan. 

## 2016-11-18 ENCOUNTER — Encounter: Payer: Self-pay | Admitting: Physical Medicine & Rehabilitation

## 2016-11-18 ENCOUNTER — Encounter (HOSPITAL_BASED_OUTPATIENT_CLINIC_OR_DEPARTMENT_OTHER): Payer: Medicare Other | Admitting: Physical Medicine & Rehabilitation

## 2016-11-18 VITALS — BP 137/74 | HR 91 | Resp 14

## 2016-11-18 DIAGNOSIS — I10 Essential (primary) hypertension: Secondary | ICD-10-CM | POA: Diagnosis not present

## 2016-11-18 DIAGNOSIS — E785 Hyperlipidemia, unspecified: Secondary | ICD-10-CM | POA: Diagnosis not present

## 2016-11-18 DIAGNOSIS — Z86010 Personal history of colonic polyps: Secondary | ICD-10-CM | POA: Diagnosis not present

## 2016-11-18 DIAGNOSIS — M1711 Unilateral primary osteoarthritis, right knee: Secondary | ICD-10-CM | POA: Diagnosis not present

## 2016-11-18 DIAGNOSIS — R269 Unspecified abnormalities of gait and mobility: Secondary | ICD-10-CM

## 2016-11-18 DIAGNOSIS — M961 Postlaminectomy syndrome, not elsewhere classified: Secondary | ICD-10-CM

## 2016-11-18 DIAGNOSIS — M109 Gout, unspecified: Secondary | ICD-10-CM | POA: Diagnosis not present

## 2016-11-18 DIAGNOSIS — G479 Sleep disorder, unspecified: Secondary | ICD-10-CM | POA: Diagnosis not present

## 2016-11-18 DIAGNOSIS — Z9049 Acquired absence of other specified parts of digestive tract: Secondary | ICD-10-CM | POA: Diagnosis not present

## 2016-11-18 DIAGNOSIS — G894 Chronic pain syndrome: Secondary | ICD-10-CM

## 2016-11-18 DIAGNOSIS — Z87891 Personal history of nicotine dependence: Secondary | ICD-10-CM | POA: Diagnosis not present

## 2016-11-18 DIAGNOSIS — M533 Sacrococcygeal disorders, not elsewhere classified: Secondary | ICD-10-CM

## 2016-11-18 DIAGNOSIS — K219 Gastro-esophageal reflux disease without esophagitis: Secondary | ICD-10-CM | POA: Diagnosis not present

## 2016-11-18 DIAGNOSIS — G500 Trigeminal neuralgia: Secondary | ICD-10-CM | POA: Diagnosis not present

## 2016-11-18 DIAGNOSIS — Z955 Presence of coronary angioplasty implant and graft: Secondary | ICD-10-CM | POA: Diagnosis not present

## 2016-11-18 DIAGNOSIS — I779 Disorder of arteries and arterioles, unspecified: Secondary | ICD-10-CM | POA: Diagnosis not present

## 2016-11-18 DIAGNOSIS — J449 Chronic obstructive pulmonary disease, unspecified: Secondary | ICD-10-CM | POA: Diagnosis not present

## 2016-11-18 DIAGNOSIS — Z5181 Encounter for therapeutic drug level monitoring: Secondary | ICD-10-CM | POA: Diagnosis not present

## 2016-11-18 DIAGNOSIS — M792 Neuralgia and neuritis, unspecified: Secondary | ICD-10-CM | POA: Diagnosis not present

## 2016-11-18 DIAGNOSIS — Z9851 Tubal ligation status: Secondary | ICD-10-CM | POA: Diagnosis not present

## 2016-11-18 DIAGNOSIS — Z86718 Personal history of other venous thrombosis and embolism: Secondary | ICD-10-CM | POA: Diagnosis not present

## 2016-11-18 DIAGNOSIS — Z8371 Family history of colonic polyps: Secondary | ICD-10-CM | POA: Diagnosis not present

## 2016-11-18 DIAGNOSIS — I739 Peripheral vascular disease, unspecified: Secondary | ICD-10-CM | POA: Diagnosis not present

## 2016-11-18 DIAGNOSIS — Z981 Arthrodesis status: Secondary | ICD-10-CM | POA: Diagnosis not present

## 2016-11-18 DIAGNOSIS — G8929 Other chronic pain: Secondary | ICD-10-CM | POA: Diagnosis not present

## 2016-11-18 DIAGNOSIS — Z8261 Family history of arthritis: Secondary | ICD-10-CM | POA: Diagnosis not present

## 2016-11-18 NOTE — Progress Notes (Signed)
Subjective:    Patient ID: [name] [name], female    DOB: 02-23-1925, 83 y.o.   MRN: [id number]  HPI 83 y/o female with pmhpsh of PAD, COPD, DVT, HTN, Gout, trigeminal neuralgia, OA of right knee with injections, lumbar fusion present for follow up of pain in her back > right leg pain.   Initially stated: Pt stated she has had back pain "all her life".  Getting progressively worse.  Movement improves the pain.  Standing and walking exacerbate the pain. Dull pain.  Non-radiating.  Intermittent.  Denies associated numbness, tingling, weakness.  Lidoderm patch help. Pain limits pt from doing things around the house.   Last clinic visit 08/04/16.  Since last visit, she has not needed the Lidoderm patches. She has completed therapies, but continues HEP. She is using TENS once in a while.  SI joint in relatively well controlled. Sleep has significantly improved.  Denies falls.   Pain Inventory Average Pain 4 Pain Right Now 3 My pain is dull and aching  In the last 24 hours, has pain interfered with the following? General activity 3 Relation with others 3 Enjoyment of life 3 What TIME of day is your pain at its worst? daytime, evening  Sleep (in general) Good  Pain is worse with: walking and standing Pain improves with: therapy/exercise, medication and TENS Relief from Meds: 8  Mobility walk without assistance how many minutes can you walk? 4-5 do you drive?  yes Do you have any goals in this area?  yes  Function retired  Neuro/Psych weakness trouble walking  Prior Studies Any changes since last visit?  no  Physicians involved in your care Any changes since last visit?  no   Family History  Problem Relation Age of Onset  . Arthritis Mother   . Hypertension Mother   . Heart disease Father   . Colon polyps Father   . Breast cancer Sister   . Lung cancer Sister   . Colon cancer Sister   . Cancer Sister        Breast  . Cancer Daughter        Breast  . Hypertension  Son   . Lung cancer Brother   . Cancer Brother        Lung  . Anesthesia problems Neg Hx   . Hypotension Neg Hx   . Malignant hyperthermia Neg Hx   . Pseudochol deficiency Neg Hx    Social History   Social History  . Marital status: Single    Spouse name: N/A  . Number of children: 4  . Years of education: 12   Occupational History  .  Retired   Social History Main Topics  . Smoking status: Former Smoker    Packs/day: 2.00    Years: 30.00    Types: Cigarettes    Quit date: 06/18/1993  . Smokeless tobacco: Never Used     Comment: QUIT IN 1995  . Alcohol use No  . Drug use: No  . Sexual activity: Not Asked   Other Topics Concern  . None   Social History Narrative   Patient is widowed with 4 children. 2 grandkids. Lives alone.    Patient is right handed.   Patient has high school education.   Patient drinks 1 cup daily.      Retired from [employer] for 28 years, works 2 days a week until 2016.       Hobbies: volunteers at [employer], [place] from [employer]  visits people.          Past Surgical History:  Procedure Laterality Date  . ABDOMINAL AORTAGRAM N/A 06/17/2011   Procedure: ABDOMINAL Maxcine Ham;  Surgeon: Elam Dutch, MD;  [facility];  Service: Cardiovascular;  Laterality: N/A;  . CHOLECYSTECTOMY  1998  . EYE SURGERY     cataracts removed- bilateral /w IOL  . FEMORAL-POPLITEAL BYPASS GRAFT        x2 surgeries 1990's & 2009  . INJECTION KNEE Right Aug. 2016   Gel injection for pain  . LUMBAR FUSION  07/06/2011  . TONSILLECTOMY     as a teenager   . TUBAL LIGATION     Past Medical History:  Diagnosis Date  . Allergic rhinoconjunctivitis   . Anxiety    pt. managed- uses deep breathing   . Arthritis    low back , stenosis  . COLONIC POLYPS, RECURRENT 08/29/2006   2008 last colonoscopy. No further colonoscopy.     Marland Kitchen COPD (chronic obstructive pulmonary disease) ([place])   . Diverticulosis   . DVT (deep venous thrombosis) ([place])     . Eczema   . Environmental allergies    allergy shot- q friday in Dr. Janee Morn office. PFT's abnormal- recommended Spiriva to use preop & will d/c after surgery  . GERD (gastroesophageal reflux disease)   . H/O hiatal hernia   . History of colonic polyps   . Hyperlipidemia   . Hypertension   . Lung nodule 2011  . Peripheral vascular disease ([place])   . Shingles   . Stenosis of popliteal artery (HCC)    blood clots in legs long ago    . Trigeminal neuralgia    BP 137/74 (BP Location: Right Arm, Patient Position: Sitting, Cuff Size: Normal)   Pulse 91   Resp 14   SpO2 95%   Opioid Risk Score:   Fall Risk Score:  `1  Depression screen PHQ 2/9  Depression screen [facility] 2/9 10/31/2016 06/16/2016 05/05/2016 09/14/2015 08/31/2015 08/11/2015 03/07/2015  Decreased Interest 0 0 0 0 0 0 0  Down, Depressed, Hopeless 0 0 0 0 0 0 0  PHQ - 2 Score 0 0 0 0 0 0 0  Altered sleeping - - 1 - - - -  Tired, decreased energy - - 0 - - - -  Change in appetite - - 1 - - - -  Feeling bad or failure about yourself  - - 0 - - - -  Trouble concentrating - - 0 - - - -  Moving slowly or fidgety/restless - - 0 - - - -  Suicidal thoughts - - 0 - - - -  PHQ-9 Score - - 2 - - - -  Some recent data might be hidden   Review of Systems  HENT: Negative.   Eyes: Negative.   Respiratory: Negative.   Cardiovascular: Negative.   Gastrointestinal: Negative.   Endocrine: Negative.        High blood pressure   Genitourinary: Negative.   Musculoskeletal: Positive for back pain and gait problem.  Skin: Negative.   Allergic/Immunologic: Negative.   Neurological: Positive for weakness.  Hematological: Negative.   Psychiatric/Behavioral: Negative.   All other systems reviewed and are negative.     Objective:   Physical Exam Gen: NAD. Vital signs reviewed HENT: Normocephalic, Atraumatic Eyes: EOMI. No discharge. Cardio: RRR. No JVD. Pulm: B/l clear to auscultation.  Effort normal.  Abd: Soft, BS+ MSK:  Gait WNL.    +TTP  b/l SI joints Neuro:   Sensation intact to light touch in all LE dermatomes  Strength  5/5 proximally, 5/5 distally   Skin: Open blisters b/l hands.    Assessment & Plan:  83 y/o female with pmhpsh of PAD, COPD, DVT, HTN, Gout, trigeminal neuralgia, OA of right knee with injections, lumbar fusion present for follow up of pain in her back > right leg pain.    1. Chronic low back pain with failed back syndrome   Xray of L-spine and right SI joint reviewed, SI joint unremarkable, degenerative changes in spine with right scoliosis.    Will avoid NSAIDs due to coumadin  Unable to tolerate Gabapentin  Cont heat  Cont tylenol, limit to 2000mg /day  Cont Lidoderm patches (occasionally uses)  Cont HEP, completed PT  Cont TENS unit PRN  Cont Cymbalta to 60 mg  Cont Robaxin 500 BID PRN  2. Possible Sacroiliitis on right  Xray unremakrable  Will order bracing for periods of excessive activity  3. Sleep disturbance  Significantly improved  Cont melatonin  4. Neuropathic pain  See #1  5. Gait abnormality  Improved  Does not want or need a cane at present  6. Myalgia  Will consider trigger point injections in future, not needed at present

## 2016-11-25 ENCOUNTER — Ambulatory Visit (INDEPENDENT_AMBULATORY_CARE_PROVIDER_SITE_OTHER): Payer: Medicare Other | Admitting: General Practice

## 2016-11-25 DIAGNOSIS — Z5181 Encounter for therapeutic drug level monitoring: Secondary | ICD-10-CM

## 2016-11-25 LAB — POCT INR: INR: 3

## 2016-11-25 NOTE — Progress Notes (Signed)
I have reviewed and agree with the plan. 

## 2016-11-25 NOTE — Patient Instructions (Signed)
Pre visit review using our clinic review tool, if applicable. No additional management support is needed unless otherwise documented below in the visit note. 

## 2016-11-27 ENCOUNTER — Other Ambulatory Visit: Payer: Self-pay | Admitting: Physical Medicine & Rehabilitation

## 2016-11-27 ENCOUNTER — Other Ambulatory Visit: Payer: Self-pay | Admitting: Family Medicine

## 2016-11-28 ENCOUNTER — Other Ambulatory Visit (INDEPENDENT_AMBULATORY_CARE_PROVIDER_SITE_OTHER): Payer: Medicare Other

## 2016-11-28 DIAGNOSIS — Z1211 Encounter for screening for malignant neoplasm of colon: Secondary | ICD-10-CM

## 2016-11-28 LAB — POC HEMOCCULT BLD/STL (HOME/3-CARD/SCREEN)
Card #2 Fecal Occult Blod, POC: NEGATIVE
FECAL OCCULT BLD: NEGATIVE
Fecal Occult Blood, POC: NEGATIVE

## 2016-11-28 NOTE — Telephone Encounter (Signed)
Only need substitute if recall on her particular batch. Please check with pharmacy  If it is on recall list send in irbesartan 300/hctz 25 mg for once daily or split to 2 tablets if no combo option

## 2016-11-29 NOTE — Telephone Encounter (Signed)
Spoke with [facility],  the VALSARTAN/HCTZ 320-25 MG is on recall, they have  the substitute irbesartan but does not come in this strength (300/hctz-25), they have 300/ hctz-12.5 mg available. Please Advise.

## 2016-11-29 NOTE — Telephone Encounter (Signed)
I saw another option of 150-12.5mg  of the valsartan hctz and have her take 2 at a time #180 with 3 refills

## 2016-11-30 ENCOUNTER — Other Ambulatory Visit: Payer: Self-pay

## 2016-11-30 MED ORDER — IRBESARTAN-HYDROCHLOROTHIAZIDE 150-12.5 MG PO TABS: 2.0000 | ORAL_TABLET | Freq: Every day | ORAL | 3 refills | Status: DC

## 2016-11-30 NOTE — Telephone Encounter (Signed)
Called and spoke to patient on her cell phone. She verbalized understanding

## 2016-11-30 NOTE — Telephone Encounter (Signed)
Pt states she is completely out of her bp meds, did not have one for today. Would like to know what Dr Yong Channel will switch her to.  [place], [place]

## 2016-11-30 NOTE — Telephone Encounter (Signed)
Prescription sent to the pharmacy as requested. I called the patient to let her know. Received no answer and there was no voicemail

## 2016-12-02 ENCOUNTER — Telehealth: Payer: Self-pay | Admitting: Family Medicine

## 2016-12-02 MED ORDER — HYDROCHLOROTHIAZIDE 25 MG PO TABS: 25.0000 mg | ORAL_TABLET | Freq: Every day | ORAL | 3 refills | Status: DC

## 2016-12-02 MED ORDER — IRBESARTAN 300 MG PO TABS: 300.0000 mg | ORAL_TABLET | Freq: Every day | ORAL | 3 refills | Status: DC

## 2016-12-02 NOTE — Telephone Encounter (Signed)
Meds ordered this encounter  Medications  . irbesartan (AVAPRO) 300 MG tablet    Sig: Take 1 tablet (300 mg total) by mouth daily.    Dispense:  90 tablet    Refill:  3  . hydrochlorothiazide (HYDRODIURIL) 25 MG tablet    Sig: Take 1 tablet (25 mg total) by mouth daily.    Dispense:  90 tablet    Refill:  3   Will do the above instead. I called in and informed patient.

## 2016-12-02 NOTE — Telephone Encounter (Signed)
Medication is currently in PA process. Note from Judson Roch states Patient will need to be changed to a higher dose as her insurance does not want to pay for her to take 2 tablets. [facility] stated higher 300-25 dosage does not exist. Please advise

## 2016-12-02 NOTE — Telephone Encounter (Signed)
Pt is calling stating that she has been out for a week and want to know if being off of the medication will effect her health in any kind of way?  Pharm: [facility].  Pt would like to have a call back today.

## 2016-12-20 ENCOUNTER — Ambulatory Visit (INDEPENDENT_AMBULATORY_CARE_PROVIDER_SITE_OTHER): Payer: Medicare Other | Admitting: Family Medicine

## 2016-12-20 ENCOUNTER — Encounter: Payer: Self-pay | Admitting: Family Medicine

## 2016-12-20 DIAGNOSIS — R739 Hyperglycemia, unspecified: Secondary | ICD-10-CM | POA: Diagnosis not present

## 2016-12-20 DIAGNOSIS — I739 Peripheral vascular disease, unspecified: Secondary | ICD-10-CM

## 2016-12-20 DIAGNOSIS — I779 Disorder of arteries and arterioles, unspecified: Secondary | ICD-10-CM | POA: Diagnosis not present

## 2016-12-20 DIAGNOSIS — J449 Chronic obstructive pulmonary disease, unspecified: Secondary | ICD-10-CM | POA: Diagnosis not present

## 2016-12-20 DIAGNOSIS — I10 Essential (primary) hypertension: Secondary | ICD-10-CM

## 2016-12-20 NOTE — Progress Notes (Signed)
Subjective:  [name] [name] is a 83 y.o. year old very pleasant female patient who presents for/with See problem oriented charting ROS- No chest pain or shortness of breath. No headache or blurry vision.  Some knee pain on right- may get another injection   Past Medical History-  Patient Active Problem List   Diagnosis Date Noted  . Pain in joint, lower leg 08/28/2014    Priority: High  . Peripheral arterial disease ([place]) 06/29/2007    Priority: High  . Acute gout of left foot 03/22/2016    Priority: Medium  . Trigeminal neuralgia     Priority: Medium  . Chronic low back pain 09/13/2010    Priority: Medium  . COPD mixed type ([place]) 01/22/2010    Priority: Medium  . Hyperglycemia 01/06/2009    Priority: Medium  . Hyperlipidemia 10/23/2006    Priority: Medium  . Essential hypertension 10/23/2006    Priority: Medium  . ESOPHAGEAL STRICTURE 08/09/1993    Priority: Medium  . Encounter for therapeutic drug monitoring 10/21/2013    Priority: Low  . Syncope 12/29/2011    Priority: Low  . Allergic rhinitis due to pollen 06/25/2010    Priority: Low  . Solitary pulmonary nodule 01/12/2010    Priority: Low  . Osteopenia 01/06/2009    Priority: Low  . Eczema 11/26/2007    Priority: Low  . Unilateral primary osteoarthritis, right knee 04/07/2016    Medications- reviewed and updated Current Outpatient Prescriptions  Medication Sig Dispense Refill  . acetaminophen (TYLENOL) 500 MG tablet Take 500-1,500 mg by mouth 3 (three) times daily as needed for moderate pain. Reported on 08/31/2015    . aspirin EC 81 MG tablet Take 81 mg by mouth daily.    . calcium-vitamin D (OSCAL WITH D 500-200) 500-200 MG-UNIT per tablet Take 1 tablet by mouth daily.      . Cholecalciferol (VITAMIN D3) 1000 UNITS CAPS Take 1,000 Units by mouth daily.     . colchicine (COLCRYS) 0.6 MG tablet Take 2 pills at first sign gout flare, then another pill 2 hours later if still having pain. Then take once daily until  pain resolves. 30 tablet 0  . DULoxetine (CYMBALTA) 60 MG capsule Take 1 capsule (60 mg total) by mouth daily. 30 capsule 1  . hydrochlorothiazide (HYDRODIURIL) 25 MG tablet Take 1 tablet (25 mg total) by mouth daily. 90 tablet 3  . irbesartan (AVAPRO) 300 MG tablet Take 1 tablet (300 mg total) by mouth daily. 90 tablet 3  . methocarbamol (ROBAXIN) 500 MG tablet Take 1 tablet (500 mg total) by mouth 2 (two) times daily as needed for muscle spasms. 60 tablet 1  . omeprazole (PRILOSEC) 20 MG capsule Take 20 mg by mouth every morning.     . simvastatin (ZOCOR) 40 MG tablet TAKE ONE TABLET BY MOUTH ONCE DAILY AT 6 IN THE EVENING 90 tablet 2  . warfarin (COUMADIN) 5 MG tablet TAKE TWO AND ONE-HALF TABLETS BY MOUTH ON THURSDAY, AND THEN TAKE TWO TABLETS THE REST OF THE DAYS (Patient taking differently: TAKE 1 1/2 TABLETS BY MOUTH ON MONDAYS, AND THEN TAKE ONE TABLET ALL OTHER DAYS.) 180 tablet 1   No current facility-administered medications for this visit.     Objective: BP 138/68   Pulse 70   Temp 97.9 F (36.6 C) (Oral)   Wt 177 lb (80.3 kg)   SpO2 97%   BMI 34.57 kg/m  Gen: NAD, resting comfortably CV: RRR no murmurs rubs or gallops,  weak peripheral pulses in legs but present Lungs: CTAB no crackles, wheeze, rhonchi Abdomen: soft/nontender/nondistended/normal bowel sounds. No rebound or guarding.  Ext: no edema Skin: warm, dry  Assessment/Plan:  Essential hypertension S: controlled on irbesartan 300mg  and hctz 25mg - some gout risk (had been on valsartan HCT 320-25mg ).  BP Readings from Last 3 Encounters:  12/20/16 138/68  11/18/16 137/74  10/31/16 (!) 140/58  A/P: We discussed blood pressure goal of <140/90 preferred. Continue current meds  Hyperglycemia S:  last visit had lost 3 lbs down to 176 but since then back up to 177. Had blurry vision on metformin.  Lab Results  Component Value Date   HGBA1C 6.2 06/22/2016  A/P: continue efforts- would love to see a few lbs off by  next visit  COPD mixed type S: quit smoking 1994 but near 40 years smoking history. Not on any regular inhalers. PFTs shows COPD 2013 A/P: continue to monitor without meds  Peripheral arterial disease Follows with Dr. Oneida Alar. Symptoms resolved with regular stationary bike riding. Taking coumadin, aspirin, simvastatin. No bleeding.    Future Appointments Date Time Provider [place]  12/23/2016 1:45 PM [facility]  02/17/2017 2:00 PM Jamse Arn, MD [facility]  04/27/2017 10:00 AM [facility] 4 [facility]  04/27/2017 10:45 AM Nickel, Sharmon Leyden, NP [facility]  11/01/2017 11:00 AM Stephanie Acre, RN [facility] None   Return in about 6 months (around 06/22/2017) for follow up, come fasting and update bloodwork.  Return precautions advised.  [name], MD

## 2016-12-20 NOTE — Assessment & Plan Note (Signed)
S: quit smoking 1994 but near 40 years smoking history. Not on any regular inhalers. PFTs shows COPD 2013 A/P: continue to monitor without meds

## 2016-12-20 NOTE — Patient Instructions (Addendum)
No changes today  You are doing great!   ______________________________________________________________________  Starting October 1st 2018, I will be transferring to our new location:  I would love to have you remain my patient at this new location as long as it remains convenient for you. I am excited about the opportunity to have x-ray and sports medicine in the new building but will really miss the awesome staff and physicians at [place]. Continue to schedule appointments at [facility] and we will automatically transfer them to the [place] location starting October 1st.

## 2016-12-20 NOTE — Assessment & Plan Note (Signed)
S: controlled on irbesartan 300mg  and hctz 25mg - some gout risk (had been on valsartan HCT 320-25mg ).  BP Readings from Last 3 Encounters:  12/20/16 138/68  11/18/16 137/74  10/31/16 (!) 140/58  A/P: We discussed blood pressure goal of <140/90 preferred. Continue current meds

## 2016-12-20 NOTE — Assessment & Plan Note (Signed)
Follows with Dr. Oneida Alar. Symptoms resolved with regular stationary bike riding. Taking coumadin, aspirin, simvastatin. No bleeding.

## 2016-12-20 NOTE — Assessment & Plan Note (Signed)
S:  last visit had lost 3 lbs down to 176 but since then back up to 177. Had blurry vision on metformin.  Lab Results  Component Value Date   HGBA1C 6.2 06/22/2016  A/P: continue efforts- would love to see a few lbs off by next visit

## 2016-12-23 ENCOUNTER — Ambulatory Visit (INDEPENDENT_AMBULATORY_CARE_PROVIDER_SITE_OTHER): Payer: Medicare Other | Admitting: General Practice

## 2016-12-23 DIAGNOSIS — Z5181 Encounter for therapeutic drug level monitoring: Secondary | ICD-10-CM

## 2016-12-23 NOTE — Patient Instructions (Signed)
Pre visit review using our clinic review tool, if applicable. No additional management support is needed unless otherwise documented below in the visit note. 

## 2016-12-29 NOTE — Patient Instructions (Signed)
Pre visit review using our clinic review tool, if applicable. No additional management support is needed unless otherwise documented below in the visit note. 

## 2016-12-30 ENCOUNTER — Ambulatory Visit (INDEPENDENT_AMBULATORY_CARE_PROVIDER_SITE_OTHER): Payer: Medicare Other | Admitting: General Practice

## 2016-12-30 DIAGNOSIS — Z5181 Encounter for therapeutic drug level monitoring: Secondary | ICD-10-CM | POA: Diagnosis not present

## 2016-12-30 LAB — POCT INR: INR: 3.1

## 2017-02-03 ENCOUNTER — Ambulatory Visit (INDEPENDENT_AMBULATORY_CARE_PROVIDER_SITE_OTHER): Payer: Medicare Other | Admitting: General Practice

## 2017-02-03 DIAGNOSIS — Z5181 Encounter for therapeutic drug level monitoring: Secondary | ICD-10-CM

## 2017-02-03 DIAGNOSIS — Z7901 Long term (current) use of anticoagulants: Secondary | ICD-10-CM

## 2017-02-03 LAB — POCT INR: INR: 3.2

## 2017-02-03 NOTE — Progress Notes (Signed)
I have reviewed and agree with the plan. 

## 2017-02-03 NOTE — Patient Instructions (Signed)
Pre visit review using our clinic review tool, if applicable. No additional management support is needed unless otherwise documented below in the visit note. 

## 2017-02-15 ENCOUNTER — Telehealth (INDEPENDENT_AMBULATORY_CARE_PROVIDER_SITE_OTHER): Payer: Self-pay | Admitting: Orthopaedic Surgery

## 2017-02-15 NOTE — Telephone Encounter (Signed)
yes

## 2017-02-15 NOTE — Telephone Encounter (Signed)
Last gel inj was given 08/30/16-synvisc

## 2017-02-15 NOTE — Telephone Encounter (Signed)
Is this okay?

## 2017-02-15 NOTE — Telephone Encounter (Signed)
Patient would like to get the process started for the right knee gel injection, approval through her insurance

## 2017-02-15 NOTE — Telephone Encounter (Signed)
Ok

## 2017-02-16 ENCOUNTER — Other Ambulatory Visit: Payer: Self-pay | Admitting: Physical Medicine & Rehabilitation

## 2017-02-17 ENCOUNTER — Encounter: Payer: Medicare Other | Admitting: Physical Medicine & Rehabilitation

## 2017-02-20 ENCOUNTER — Telehealth (INDEPENDENT_AMBULATORY_CARE_PROVIDER_SITE_OTHER): Payer: Self-pay | Admitting: Orthopaedic Surgery

## 2017-02-20 NOTE — Telephone Encounter (Signed)
Lis,  Patient called wanting to know if injection was ordered. She has not heard anything back since she called last week. Please call pt to advise the remaining process of getting this injection

## 2017-02-22 ENCOUNTER — Telehealth: Payer: Self-pay

## 2017-02-22 ENCOUNTER — Encounter: Payer: Self-pay | Admitting: Physical Medicine & Rehabilitation

## 2017-02-22 ENCOUNTER — Encounter: Payer: Medicare Other | Attending: Physical Medicine & Rehabilitation | Admitting: Physical Medicine & Rehabilitation

## 2017-02-22 VITALS — BP 136/82 | HR 72

## 2017-02-22 DIAGNOSIS — I10 Essential (primary) hypertension: Secondary | ICD-10-CM | POA: Insufficient documentation

## 2017-02-22 DIAGNOSIS — Z8371 Family history of colonic polyps: Secondary | ICD-10-CM | POA: Diagnosis not present

## 2017-02-22 DIAGNOSIS — J449 Chronic obstructive pulmonary disease, unspecified: Secondary | ICD-10-CM | POA: Insufficient documentation

## 2017-02-22 DIAGNOSIS — G8929 Other chronic pain: Secondary | ICD-10-CM

## 2017-02-22 DIAGNOSIS — M791 Myalgia, unspecified site: Secondary | ICD-10-CM | POA: Diagnosis not present

## 2017-02-22 DIAGNOSIS — M1711 Unilateral primary osteoarthritis, right knee: Secondary | ICD-10-CM | POA: Diagnosis not present

## 2017-02-22 DIAGNOSIS — I779 Disorder of arteries and arterioles, unspecified: Secondary | ICD-10-CM

## 2017-02-22 DIAGNOSIS — G479 Sleep disorder, unspecified: Secondary | ICD-10-CM | POA: Insufficient documentation

## 2017-02-22 DIAGNOSIS — K219 Gastro-esophageal reflux disease without esophagitis: Secondary | ICD-10-CM | POA: Insufficient documentation

## 2017-02-22 DIAGNOSIS — M109 Gout, unspecified: Secondary | ICD-10-CM | POA: Diagnosis not present

## 2017-02-22 DIAGNOSIS — K449 Diaphragmatic hernia without obstruction or gangrene: Secondary | ICD-10-CM | POA: Diagnosis not present

## 2017-02-22 DIAGNOSIS — R269 Unspecified abnormalities of gait and mobility: Secondary | ICD-10-CM

## 2017-02-22 DIAGNOSIS — M961 Postlaminectomy syndrome, not elsewhere classified: Secondary | ICD-10-CM

## 2017-02-22 DIAGNOSIS — G500 Trigeminal neuralgia: Secondary | ICD-10-CM | POA: Insufficient documentation

## 2017-02-22 DIAGNOSIS — M533 Sacrococcygeal disorders, not elsewhere classified: Secondary | ICD-10-CM

## 2017-02-22 DIAGNOSIS — I739 Peripheral vascular disease, unspecified: Secondary | ICD-10-CM | POA: Diagnosis not present

## 2017-02-22 DIAGNOSIS — G894 Chronic pain syndrome: Secondary | ICD-10-CM

## 2017-02-22 DIAGNOSIS — Z86718 Personal history of other venous thrombosis and embolism: Secondary | ICD-10-CM | POA: Insufficient documentation

## 2017-02-22 DIAGNOSIS — E785 Hyperlipidemia, unspecified: Secondary | ICD-10-CM | POA: Diagnosis not present

## 2017-02-22 DIAGNOSIS — M25561 Pain in right knee: Secondary | ICD-10-CM

## 2017-02-22 DIAGNOSIS — M545 Low back pain: Secondary | ICD-10-CM | POA: Insufficient documentation

## 2017-02-22 DIAGNOSIS — M419 Scoliosis, unspecified: Secondary | ICD-10-CM | POA: Insufficient documentation

## 2017-02-22 DIAGNOSIS — Z87891 Personal history of nicotine dependence: Secondary | ICD-10-CM | POA: Diagnosis not present

## 2017-02-22 DIAGNOSIS — M792 Neuralgia and neuritis, unspecified: Secondary | ICD-10-CM

## 2017-02-22 DIAGNOSIS — M79604 Pain in right leg: Secondary | ICD-10-CM | POA: Insufficient documentation

## 2017-02-22 MED ORDER — DICLOFENAC SODIUM 2 % TD SOLN: TRANSDERMAL | 1 refills | Status: DC

## 2017-02-22 NOTE — Telephone Encounter (Signed)
APPT MADE

## 2017-02-22 NOTE — Progress Notes (Signed)
Subjective:    Patient ID: [name] [name], female    DOB: Mar 22, 1925, 83 y.o.   MRN: [id number]  HPI 83 y/o female with pmhpsh of PAD, COPD, DVT, HTN, Gout, trigeminal neuralgia, OA of right knee with injections, lumbar fusion present for follow up of pain in her back > right leg pain.   Initially stated: Pt stated she has had back pain "all her life".  Getting progressively worse.  Movement improves the pain.  Standing and walking exacerbate the pain. Dull pain.  Non-radiating.  Intermittent.  Denies associated numbness, tingling, weakness.  Lidoderm patch help. Pain limits pt from doing things around the house.   Last clinic visit 11/18/16.  Since that time, she states she has been doing fairly well.  She states she is having pain in her knee.  She is seeing Ortho and receiving HLA injections.  She uses medications as needed. She never received a brace for her back.  She notes weakness at times due to the knee pain.   Pain Inventory Average Pain 6 Pain Right Now 8 My pain is aching  In the last 24 hours, has pain interfered with the following? General activity 6 Relation with others 6 Enjoyment of life 6 What TIME of day is your pain at its worst? daytime, evening  Sleep (in general) Good  Pain is worse with: . Pain improves with: . Relief from Meds: 7  Mobility walk without assistance how many minutes can you walk? 3-4 ability to climb steps?  yes do you drive?  yes Do you have any goals in this area?  yes  Function retired I need assistance with the following:  household duties Do you have any goals in this area?  no  Neuro/Psych bladder control problems  Prior Studies Any changes since last visit?  no  Physicians involved in your care Any changes since last visit?  no   Family History  Problem Relation Age of Onset  . Arthritis Mother   . Hypertension Mother   . Heart disease Father   . Colon polyps Father   . Breast cancer Sister   . Lung cancer Sister    . Colon cancer Sister   . Cancer Sister        Breast  . Cancer Daughter        Breast  . Hypertension Son   . Lung cancer Brother   . Cancer Brother        Lung  . Anesthesia problems Neg Hx   . Hypotension Neg Hx   . Malignant hyperthermia Neg Hx   . Pseudochol deficiency Neg Hx    Social History   Social History  . Marital status: Single    Spouse name: N/A  . Number of children: 4  . Years of education: 12   Occupational History  .  Retired   Social History Main Topics  . Smoking status: Former Smoker    Packs/day: 2.00    Years: 30.00    Types: Cigarettes    Quit date: 06/18/1993  . Smokeless tobacco: Never Used     Comment: QUIT IN 1995  . Alcohol use No  . Drug use: No  . Sexual activity: Not on file   Other Topics Concern  . Not on file   Social History Narrative   Patient is widowed with 4 children. 2 grandkids. Lives alone.    Patient is right handed.   Patient has high school education.   Patient drinks  1 cup daily.      Retired from [employer] for 28 years, works 2 days a week until 2016.       Hobbies: volunteers at [employer], [employer] from church visits people.          Past Surgical History:  Procedure Laterality Date  . ABDOMINAL AORTAGRAM N/A 06/17/2011   Procedure: ABDOMINAL Maxcine Ham;  Surgeon: Elam Dutch, MD;  Location: [facility] CATH LAB;  Service: Cardiovascular;  Laterality: N/A;  . CHOLECYSTECTOMY  1998  . EYE SURGERY     cataracts removed- bilateral /w IOL  . FEMORAL-POPLITEAL BYPASS GRAFT        x2 surgeries 1990's & 2009  . INJECTION KNEE Right Aug. 2016   Gel injection for pain  . LUMBAR FUSION  07/06/2011  . TONSILLECTOMY     as a teenager   . TUBAL LIGATION     Past Medical History:  Diagnosis Date  . Allergic rhinoconjunctivitis   . Anxiety    pt. managed- uses deep breathing   . Arthritis    low back , stenosis  . COLONIC POLYPS, RECURRENT 08/29/2006   2008 last colonoscopy. No further colonoscopy.      Marland Kitchen COPD (chronic obstructive pulmonary disease) ([place])   . Diverticulosis   . DVT (deep venous thrombosis) ([place])   . Eczema   . Environmental allergies    allergy shot- q friday in Dr. Janee Morn office. PFT's abnormal- recommended Spiriva to use preop & will d/c after surgery  . GERD (gastroesophageal reflux disease)   . H/O hiatal hernia   . History of colonic polyps   . Hyperlipidemia   . Hypertension   . Lung nodule 2011  . Peripheral vascular disease ([place])   . Shingles   . Stenosis of popliteal artery (HCC)    blood clots in legs long ago    . Trigeminal neuralgia    BP 136/82   Pulse 72   SpO2 94%   Opioid Risk Score:   Fall Risk Score:  `1  Depression screen PHQ 2/9  Depression screen [facility] 2/9 02/22/2017 10/31/2016 06/16/2016 05/05/2016 09/14/2015 08/31/2015 08/11/2015  Decreased Interest 0 0 0 0 0 0 0  Down, Depressed, Hopeless 0 0 0 0 0 0 0  PHQ - 2 Score 0 0 0 0 0 0 0  Altered sleeping - - - 1 - - -  Tired, decreased energy - - - 0 - - -  Change in appetite - - - 1 - - -  Feeling bad or failure about yourself  - - - 0 - - -  Trouble concentrating - - - 0 - - -  Moving slowly or fidgety/restless - - - 0 - - -  Suicidal thoughts - - - 0 - - -  PHQ-9 Score - - - 2 - - -  Some recent data might be hidden   Review of Systems  HENT: Negative.   Eyes: Negative.   Respiratory: Negative.   Cardiovascular: Negative.   Gastrointestinal: Negative.   Endocrine: Negative.        High blood pressure   Genitourinary: Negative.   Musculoskeletal: Positive for back pain and gait problem.  Skin: Negative.   Allergic/Immunologic: Negative.   Neurological: Positive for weakness.  Hematological: Negative.   Psychiatric/Behavioral: Negative.   All other systems reviewed and are negative.     Objective:   Physical Exam Gen: NAD. Vital signs reviewed HENT: Normocephalic, Atraumatic Eyes: EOMI. No discharge. Cardio: RRR.  No JVD. Pulm: B/l clear to auscultation.  Effort normal.   Abd: Soft, BS+ MSK:  Gait WNL.   +TTP b/l SI joints Neuro:   Sensation intact to light touch in all LE dermatomes  Strength  5/5 proximally, 5/5 distally   Skin: Open blisters b/l hands.    Assessment & Plan:  83 y/o female with pmhpsh of PAD, COPD, DVT, HTN, Gout, trigeminal neuralgia, OA of right knee with injections, lumbar fusion present for follow up of pain in her back > right leg pain.    1. Chronic low back pain with failed back syndrome   Xray of L-spine and right SI joint reviewed, SI joint unremarkable, degenerative changes in spine with right scoliosis.    Will avoid NSAIDs due to coumadin  Unable to tolerate Gabapentin  Cont heat  Cont tylenol, limit to 2090m/day  Cont Lidoderm patches (occasionally uses)  Cont HEP, completed PT  Cont TENS unit PRN  Cont Cymbalta to 60 mg  Cont Robaxin 500 BID PRN  2. Possible Sacroiliitis on right  Xray unremakrable  Ordered bracing for periods of excessive activity, pt states she never received a phone call  3. Sleep disturbance  Significantly improved  Cont melatonin  4. Neuropathic pain  See #1  5. Gait abnormality  Improved  Now amenable to cane for periods of instablity  6. Myalgia  Will consider trigger point injections in future, not needed at present  7. Right knee pain  MRI reviewed from 2016, showing meniscal/cartilage damage  Receiving injections from Ortho, cont  Will order Pensaid as well

## 2017-02-22 NOTE — Telephone Encounter (Signed)
Called patient no answer LMOM. inj was approved. She can schedule appt with Dr [name]  after 03/02/17 (6 months after prev inj). If patient calls back please make appt with Dr [name]  FOR SYNVISC INJ BUY & BILL-RIGHT KNEE

## 2017-02-22 NOTE — Telephone Encounter (Signed)
Recieved fax from [facility] stating that this patients pennsaid 2 needs a prior auth  Prior auth started today 02-22-17

## 2017-02-22 NOTE — Telephone Encounter (Signed)
Tried to call patient again no answer. LMOM to return our call.  

## 2017-02-23 ENCOUNTER — Telehealth: Payer: Self-pay

## 2017-02-23 NOTE — Telephone Encounter (Signed)
Prior Auth came back denied, attempting appeal

## 2017-03-02 NOTE — Telephone Encounter (Signed)
error 

## 2017-03-03 ENCOUNTER — Ambulatory Visit (INDEPENDENT_AMBULATORY_CARE_PROVIDER_SITE_OTHER): Payer: Medicare Other | Admitting: Orthopaedic Surgery

## 2017-03-03 ENCOUNTER — Ambulatory Visit (INDEPENDENT_AMBULATORY_CARE_PROVIDER_SITE_OTHER): Payer: Medicare Other | Admitting: General Practice

## 2017-03-03 DIAGNOSIS — Z7901 Long term (current) use of anticoagulants: Secondary | ICD-10-CM

## 2017-03-03 DIAGNOSIS — M1711 Unilateral primary osteoarthritis, right knee: Secondary | ICD-10-CM

## 2017-03-03 LAB — POCT INR: INR: 4.5

## 2017-03-03 MED ORDER — HYLAN G-F 20 48 MG/6ML IX SOSY
48.0000 mg | PREFILLED_SYRINGE | INTRA_ARTICULAR | Status: AC | PRN
  Administered 2017-03-03: 48 mg via INTRA_ARTICULAR

## 2017-03-03 NOTE — Progress Notes (Signed)
   Procedure Note  Patient: [name] [name]             Date of Birth: 1924/09/14           MRN: [id number]             Visit Date: 03/03/2017  Procedures: Visit Diagnoses: Unilateral primary osteoarthritis, right knee  Large Joint Inj: R knee on 03/03/2017 9:25 AM Indications: pain Details: 22 G needle  Arthrogram: No  Medications: 48 mg Hylan 48 MG/6ML Outcome: tolerated well, no immediate complications Patient was prepped and draped in the usual sterile fashion.

## 2017-03-03 NOTE — Patient Instructions (Signed)
Pre visit review using our clinic review tool, if applicable. No additional management support is needed unless otherwise documented below in the visit note. 

## 2017-03-22 ENCOUNTER — Ambulatory Visit (INDEPENDENT_AMBULATORY_CARE_PROVIDER_SITE_OTHER): Payer: Medicare Other | Admitting: General Practice

## 2017-03-22 ENCOUNTER — Encounter: Payer: Medicare Other | Admitting: Physical Medicine & Rehabilitation

## 2017-03-22 DIAGNOSIS — Z7901 Long term (current) use of anticoagulants: Secondary | ICD-10-CM | POA: Diagnosis not present

## 2017-03-22 LAB — POCT INR: INR: 3.7

## 2017-03-22 NOTE — Progress Notes (Signed)
I agree with this plan.

## 2017-03-22 NOTE — Patient Instructions (Addendum)
Pre visit review using our clinic review tool, if applicable. No additional management support is needed unless otherwise documented below in the visit note.  Hold today's dose (11/28) and then take 1 tablet daily.  Re-check in 4 weeks.

## 2017-03-28 ENCOUNTER — Telehealth (INDEPENDENT_AMBULATORY_CARE_PROVIDER_SITE_OTHER): Payer: Self-pay | Admitting: Orthopaedic Surgery

## 2017-03-28 NOTE — Telephone Encounter (Signed)
I spoke to her and discussed that we need another MRI of her knee to r/o structural abnormalities.  Please also set her up with a f/u appt after MRI.  Thanks.

## 2017-03-28 NOTE — Telephone Encounter (Signed)
Patients requests a call to discuss some of the questions she has. Can you give her a call back when possible? CB [phone]

## 2017-03-28 NOTE — Telephone Encounter (Signed)
Called pt and she states that she had the hylan injection several weeks ago and still is having pain in her knee. She does not feel like it helped and wants to know what she should do next. Please advise.

## 2017-03-29 ENCOUNTER — Other Ambulatory Visit (INDEPENDENT_AMBULATORY_CARE_PROVIDER_SITE_OTHER): Payer: Self-pay

## 2017-03-29 DIAGNOSIS — M25561 Pain in right knee: Principal | ICD-10-CM

## 2017-03-29 DIAGNOSIS — G8929 Other chronic pain: Secondary | ICD-10-CM

## 2017-03-29 NOTE — Telephone Encounter (Signed)
Order in chart for MRI and for return appt to be made with Dr. [name] per his request.

## 2017-04-05 ENCOUNTER — Encounter: Payer: Medicare Other | Admitting: Physical Medicine & Rehabilitation

## 2017-04-07 ENCOUNTER — Telehealth (INDEPENDENT_AMBULATORY_CARE_PROVIDER_SITE_OTHER): Payer: Self-pay | Admitting: *Deleted

## 2017-04-07 NOTE — Telephone Encounter (Signed)
-----   Message from Pamella Pert, [place] sent at 03/29/2017 10:34 AM EST ----- Regarding: xu pt Can oyu also make a return visit with Dr. [name] after this MRI per his request? thanks

## 2017-04-07 NOTE — Telephone Encounter (Signed)
I c pt to scheudle follow up and she will call back to schedule pt working right now and not near calendar

## 2017-04-11 ENCOUNTER — Encounter: Payer: Self-pay | Admitting: *Deleted

## 2017-04-13 ENCOUNTER — Encounter: Payer: Self-pay | Admitting: Physical Medicine & Rehabilitation

## 2017-04-13 ENCOUNTER — Encounter: Payer: Medicare Other | Attending: Physical Medicine & Rehabilitation | Admitting: Physical Medicine & Rehabilitation

## 2017-04-13 VITALS — BP 135/68 | HR 86

## 2017-04-13 DIAGNOSIS — G479 Sleep disorder, unspecified: Secondary | ICD-10-CM | POA: Diagnosis not present

## 2017-04-13 DIAGNOSIS — M545 Low back pain: Secondary | ICD-10-CM | POA: Insufficient documentation

## 2017-04-13 DIAGNOSIS — M419 Scoliosis, unspecified: Secondary | ICD-10-CM | POA: Diagnosis not present

## 2017-04-13 DIAGNOSIS — G500 Trigeminal neuralgia: Secondary | ICD-10-CM | POA: Insufficient documentation

## 2017-04-13 DIAGNOSIS — M533 Sacrococcygeal disorders, not elsewhere classified: Secondary | ICD-10-CM

## 2017-04-13 DIAGNOSIS — G894 Chronic pain syndrome: Secondary | ICD-10-CM

## 2017-04-13 DIAGNOSIS — I739 Peripheral vascular disease, unspecified: Secondary | ICD-10-CM | POA: Insufficient documentation

## 2017-04-13 DIAGNOSIS — M25561 Pain in right knee: Secondary | ICD-10-CM | POA: Insufficient documentation

## 2017-04-13 DIAGNOSIS — Z87891 Personal history of nicotine dependence: Secondary | ICD-10-CM | POA: Diagnosis not present

## 2017-04-13 DIAGNOSIS — I10 Essential (primary) hypertension: Secondary | ICD-10-CM | POA: Insufficient documentation

## 2017-04-13 DIAGNOSIS — M1711 Unilateral primary osteoarthritis, right knee: Secondary | ICD-10-CM | POA: Diagnosis not present

## 2017-04-13 DIAGNOSIS — Z86718 Personal history of other venous thrombosis and embolism: Secondary | ICD-10-CM | POA: Diagnosis not present

## 2017-04-13 DIAGNOSIS — Z8371 Family history of colonic polyps: Secondary | ICD-10-CM | POA: Diagnosis not present

## 2017-04-13 DIAGNOSIS — R269 Unspecified abnormalities of gait and mobility: Secondary | ICD-10-CM

## 2017-04-13 DIAGNOSIS — K449 Diaphragmatic hernia without obstruction or gangrene: Secondary | ICD-10-CM | POA: Insufficient documentation

## 2017-04-13 DIAGNOSIS — E785 Hyperlipidemia, unspecified: Secondary | ICD-10-CM | POA: Diagnosis not present

## 2017-04-13 DIAGNOSIS — M79604 Pain in right leg: Secondary | ICD-10-CM | POA: Diagnosis not present

## 2017-04-13 DIAGNOSIS — M109 Gout, unspecified: Secondary | ICD-10-CM | POA: Insufficient documentation

## 2017-04-13 DIAGNOSIS — G8929 Other chronic pain: Secondary | ICD-10-CM

## 2017-04-13 DIAGNOSIS — M791 Myalgia, unspecified site: Secondary | ICD-10-CM | POA: Diagnosis not present

## 2017-04-13 DIAGNOSIS — I779 Disorder of arteries and arterioles, unspecified: Secondary | ICD-10-CM | POA: Diagnosis not present

## 2017-04-13 DIAGNOSIS — M961 Postlaminectomy syndrome, not elsewhere classified: Secondary | ICD-10-CM | POA: Diagnosis not present

## 2017-04-13 DIAGNOSIS — J449 Chronic obstructive pulmonary disease, unspecified: Secondary | ICD-10-CM | POA: Diagnosis not present

## 2017-04-13 DIAGNOSIS — K219 Gastro-esophageal reflux disease without esophagitis: Secondary | ICD-10-CM | POA: Diagnosis not present

## 2017-04-13 MED ORDER — DICLOFENAC SODIUM 1 % TD GEL: 2.0000 g | Freq: Four times a day (QID) | TRANSDERMAL | 1 refills | Status: DC

## 2017-04-13 NOTE — Progress Notes (Signed)
Subjective:    Patient ID: [name] [name], female    DOB: Jan 23, 1925, 83 y.o.   MRN: [id number]  HPI 83 y/o female with pmhpsh of PAD, COPD, DVT, HTN, Gout, trigeminal neuralgia, OA of right knee with injections, lumbar fusion present for follow up of pain in her back > right leg pain.   Initially stated: Pt stated she has had back pain "all her life".  Getting progressively worse.  Movement improves the pain.  Standing and walking exacerbate the pain. Dull pain.  Non-radiating.  Intermittent.  Denies associated numbness, tingling, weakness.  Lidoderm patch help. Pain limits pt from doing things around the house.   Last clinic visit 02/22/17.  Since last visit, pt states she received the brace and it gives her benefit. She obtained the cane, but is not using it.  Denies falls. Her back felt very good yesterday, but is hurting with the rain.  She is seeing Ortho for her knee and has an MRI scheduled on Saturday.   Pain Inventory Average Pain 3 Pain Right Now 5 My pain is sharp  In the last 24 hours, has pain interfered with the following? General activity 5 Relation with others 6 Enjoyment of life 9 What TIME of day is your pain at its worst? daytime, evening  Sleep (in general) Good  Pain is worse with: . Pain improves with: . Relief from Meds: 7  Mobility walk without assistance how many minutes can you walk? 3-4 ability to climb steps?  yes do you drive?  yes Do you have any goals in this area?  yes  Function retired I need assistance with the following:  household duties Do you have any goals in this area?  no  Neuro/Psych bladder control problems  Prior Studies Any changes since last visit?  no  Physicians involved in your care Any changes since last visit?  no   Family History  Problem Relation Age of Onset  . Arthritis Mother   . Hypertension Mother   . Heart disease Father   . Colon polyps Father   . Breast cancer Sister   . Lung cancer Sister   .  Colon cancer Sister   . Cancer Sister        Breast  . Cancer Daughter        Breast  . Hypertension Son   . Lung cancer Brother   . Cancer Brother        Lung  . Anesthesia problems Neg Hx   . Hypotension Neg Hx   . Malignant hyperthermia Neg Hx   . Pseudochol deficiency Neg Hx    Social History   Socioeconomic History  . Marital status: Single    Spouse name: None  . Number of children: 4  . Years of education: 60  . Highest education level: None  Social Needs  . Financial resource strain: None  . Food insecurity - worry: None  . Food insecurity - inability: None  . Transportation needs - medical: None  . Transportation needs - non-medical: None  Occupational History    Employer: RETIRED  Tobacco Use  . Smoking status: Former Smoker    Packs/day: 2.00    Years: 30.00    Pack years: 60.00    Types: Cigarettes    Last attempt to quit: 06/18/1993    Years since quitting: 23.8  . Smokeless tobacco: Never Used  . Tobacco comment: QUIT IN 1995  Substance and Sexual Activity  . Alcohol use: No  Alcohol/week: 0.0 oz  . Drug use: No  . Sexual activity: None  Other Topics Concern  . None  Social History Narrative   Patient is widowed with 4 children. 2 grandkids. Lives alone.    Patient is right handed.   Patient has high school education.   Patient drinks 1 cup daily.      Retired from [employer] for 28 years, works 2 days a week until 2016.       Hobbies: volunteers at [employer], [employer] from church visits people.       Past Surgical History:  Procedure Laterality Date  . ABDOMINAL AORTAGRAM N/A 06/17/2011   Procedure: ABDOMINAL Maxcine Ham;  Surgeon: Elam Dutch, MD;  Location: [facility] CATH LAB;  Service: Cardiovascular;  Laterality: N/A;  . CHOLECYSTECTOMY  1998  . EYE SURGERY     cataracts removed- bilateral /w IOL  . FEMORAL-POPLITEAL BYPASS GRAFT        x2 surgeries 1990's & 2009  . INJECTION KNEE Right Aug. 2016   Gel injection for  pain  . LUMBAR FUSION  07/06/2011  . TONSILLECTOMY     as a teenager   . TUBAL LIGATION     Past Medical History:  Diagnosis Date  . Allergic rhinoconjunctivitis   . Anxiety    pt. managed- uses deep breathing   . Arthritis    low back , stenosis  . COLONIC POLYPS, RECURRENT 08/29/2006   2008 last colonoscopy. No further colonoscopy.     Marland Kitchen COPD (chronic obstructive pulmonary disease) ([place])   . Diverticulosis   . DVT (deep venous thrombosis) ([place])   . Eczema   . Environmental allergies    allergy shot- q friday in Dr. Janee Morn office. PFT's abnormal- recommended Spiriva to use preop & will d/c after surgery  . GERD (gastroesophageal reflux disease)   . H/O hiatal hernia   . History of colonic polyps   . Hyperlipidemia   . Hypertension   . Lung nodule 2011  . Peripheral vascular disease ([place])   . Shingles   . Stenosis of popliteal artery (HCC)    blood clots in legs long ago    . Trigeminal neuralgia    BP 135/68   Pulse 86   SpO2 96%   Opioid Risk Score:   Fall Risk Score:  `1  Depression screen PHQ 2/9  Depression screen [facility] 2/9 02/22/2017 10/31/2016 06/16/2016 05/05/2016 09/14/2015 08/31/2015 08/11/2015  Decreased Interest 0 0 0 0 0 0 0  Down, Depressed, Hopeless 0 0 0 0 0 0 0  PHQ - 2 Score 0 0 0 0 0 0 0  Altered sleeping - - - 1 - - -  Tired, decreased energy - - - 0 - - -  Change in appetite - - - 1 - - -  Feeling bad or failure about yourself  - - - 0 - - -  Trouble concentrating - - - 0 - - -  Moving slowly or fidgety/restless - - - 0 - - -  Suicidal thoughts - - - 0 - - -  PHQ-9 Score - - - 2 - - -  Some recent data might be hidden   Review of Systems  HENT: Negative.   Eyes: Negative.   Respiratory: Negative.   Cardiovascular: Negative.   Gastrointestinal: Negative.   Endocrine: Negative.        High blood pressure   Genitourinary: Negative.   Musculoskeletal: Positive for back pain.  Skin: Negative.  Allergic/Immunologic: Negative.   Neurological:  Negative.   Hematological: Negative.   Psychiatric/Behavioral: Negative.   All other systems reviewed and are negative.     Objective:   Physical Exam Gen: NAD. Vital signs reviewed HENT: Normocephalic, Atraumatic Eyes: EOMI. No discharge. Cardio: RRR. No JVD. Pulm: B/l clear to auscultation.  Effort normal.  Abd: Soft, BS+ MSK:  Gait WNL.   +TTP lumbosacral PSPs Neuro:   Sensation intact to light touch in all LE dermatomes  Strength  5/5 proximally, 5/5 distally  Skin: Open blisters b/l hands.    Assessment & Plan:  83 y/o female with pmhpsh of PAD, COPD, DVT, HTN, Gout, trigeminal neuralgia, OA of right knee with injections, lumbar fusion present for follow up of pain in her back > right leg pain.    1. Chronic low back pain with failed back syndrome   Xray of L-spine and right SI joint reviewed, SI joint unremarkable, degenerative changes in spine with right scoliosis.    Will avoid NSAIDs due to coumadin  Unable to tolerate Gabapentin  Cont heat  Cont tylenol, limit to 2000mg /day  Cont Lidoderm patches (occasionally uses)  Cont HEP, completed PT  Cont TENS unit PRN  Cont Cymbalta to 60 mg  Cont Robaxin 500 BID PRN  2. Possible Sacroiliitis on right  Xray unremakrable  Cont bracing PRN  3. Sleep disturbance  Significantly improved  Cont melatonin  4. Neuropathic pain  See #1  5. Gait abnormality  Improved  Now amenable to cane for periods of instablity  6. Myalgia  Will consider trigger point injections in future, not needed at present  7. Right knee pain  MRI reviewed from 2016, showing meniscal/cartilage damage  Receiving injections from Ortho, cont  Pensaid denied  Will order voltaren gel  Encouraged trail lidoderm on knee

## 2017-04-15 ENCOUNTER — Ambulatory Visit
Admission: RE | Admit: 2017-04-15 | Discharge: 2017-04-15 | Disposition: A | Discharge status: Home / Self Care | Payer: Medicare Other | Source: Ambulatory Visit | Attending: Orthopaedic Surgery | Admitting: Orthopaedic Surgery

## 2017-04-15 DIAGNOSIS — G8929 Other chronic pain: Secondary | ICD-10-CM

## 2017-04-15 DIAGNOSIS — M25561 Pain in right knee: Secondary | ICD-10-CM | POA: Diagnosis not present

## 2017-04-20 ENCOUNTER — Other Ambulatory Visit: Payer: Self-pay | Admitting: Physical Medicine & Rehabilitation

## 2017-04-21 ENCOUNTER — Ambulatory Visit (INDEPENDENT_AMBULATORY_CARE_PROVIDER_SITE_OTHER): Payer: Medicare Other | Admitting: General Practice

## 2017-04-21 DIAGNOSIS — Z7901 Long term (current) use of anticoagulants: Secondary | ICD-10-CM | POA: Diagnosis not present

## 2017-04-21 LAB — POCT INR: INR: 3.7

## 2017-04-21 NOTE — Patient Instructions (Addendum)
Pre visit review using our clinic review tool, if applicable. No additional management support is needed unless otherwise documented below in the visit note.  Hold today's dose (12/28) and then change dosage and take take 1 tablet daily except 1/2 on Wednesdays.  Re-check in 4 weeks.

## 2017-04-24 ENCOUNTER — Ambulatory Visit (INDEPENDENT_AMBULATORY_CARE_PROVIDER_SITE_OTHER): Payer: Medicare Other | Admitting: Orthopaedic Surgery

## 2017-04-24 ENCOUNTER — Encounter (INDEPENDENT_AMBULATORY_CARE_PROVIDER_SITE_OTHER): Payer: Self-pay | Admitting: Orthopaedic Surgery

## 2017-04-24 DIAGNOSIS — M1711 Unilateral primary osteoarthritis, right knee: Secondary | ICD-10-CM

## 2017-04-24 MED ORDER — BUPIVACAINE HCL 0.5 % IJ SOLN
2.0000 mL | INTRAMUSCULAR | Status: AC | PRN
  Administered 2017-04-24: 2 mL via INTRA_ARTICULAR

## 2017-04-24 MED ORDER — LIDOCAINE HCL 1 % IJ SOLN
2.0000 mL | INTRAMUSCULAR | Status: AC | PRN
  Administered 2017-04-24: 2 mL

## 2017-04-24 MED ORDER — METHYLPREDNISOLONE ACETATE 40 MG/ML IJ SUSP
40.0000 mg | INTRAMUSCULAR | Status: AC | PRN
  Administered 2017-04-24: 40 mg via INTRA_ARTICULAR

## 2017-04-24 NOTE — Progress Notes (Signed)
Office Visit Note   Patient: [name] [name]           Date of Birth: 07/17/1924           MRN: [id number] Visit Date: 04/24/2017              Requested by: Marin Olp, MD [place], [place] [id number] PCP: Marin Olp, MD   Assessment & Plan: Visit Diagnoses:  1. Unilateral primary osteoarthritis, right knee     Plan: Impression is 83 year old female with degenerative joint disease of the right knee.  Cortisone injection was performed today.  Hopefully this will give her some relief.  They will call us back when they want [place] to submit another Synvisc injection.  Questions encouraged and answered.  Follow-up as needed. Total face to face encounter time was greater than 25 minutes and over half of this time was spent in counseling and/or coordination of care.  Follow-Up Instructions: Return if symptoms worsen or fail to improve.   Orders:  No orders of the defined types were placed in this encounter.  No orders of the defined types were placed in this encounter.     Procedures: Large Joint Inj: R knee on 04/24/2017 6:39 PM Indications: pain Details: 22 G needle  Arthrogram: No  Medications: 40 mg methylPREDNISolone acetate 40 MG/ML; 2 mL lidocaine 1 %; 2 mL bupivacaine 0.5 % Consent was given by the patient. Patient was prepped and draped in the usual sterile fashion.       Clinical Data: No additional findings.   Subjective: Chief Complaint  Patient presents with  . Right Knee - Pain    Patient comes in today for follow-up of right knee pain.  Previous Synvisc injection in November did not give her as much relief as the one before that.  She had some questions today.  She is also requesting a cortisone injection.  Right knee MRI that was done last week shows essentially degenerative joint disease.    Review of Systems   Objective: Vital Signs: There were no vitals taken for this visit.  Physical Exam  Ortho Exam Right knee  exam is stable. Specialty Comments:  No specialty comments available.  Imaging: No results found.   PMFS History: Patient Active Problem List   Diagnosis Date Noted  . Long term (current) use of anticoagulants 02/03/2017  . Unilateral primary osteoarthritis, right knee 04/07/2016  . Acute gout of left foot 03/22/2016  . Pain in joint, lower leg 08/28/2014  . Trigeminal neuralgia   . Encounter for therapeutic drug monitoring 10/21/2013  . Syncope 12/29/2011  . Chronic low back pain 09/13/2010  . Allergic rhinitis due to pollen 06/25/2010  . COPD mixed type ([place]) 01/22/2010  . Solitary pulmonary nodule 01/12/2010  . Hyperglycemia 01/06/2009  . Osteopenia 01/06/2009  . Eczema 11/26/2007  . Peripheral arterial disease ([place]) 06/29/2007  . Hyperlipidemia 10/23/2006  . Essential hypertension 10/23/2006  . ESOPHAGEAL STRICTURE 08/09/1993   Past Medical History:  Diagnosis Date  . Allergic rhinoconjunctivitis   . Anxiety    pt. managed- uses deep breathing   . Arthritis    low back , stenosis  . COLONIC POLYPS, RECURRENT 08/29/2006   2008 last colonoscopy. No further colonoscopy.     Marland Kitchen COPD (chronic obstructive pulmonary disease) ([place])   . Diverticulosis   . DVT (deep venous thrombosis) ([place])   . Eczema   . Environmental allergies    allergy shot- q friday in  Dr. Janee Morn office. PFT's abnormal- recommended Spiriva to use preop & will d/c after surgery  . GERD (gastroesophageal reflux disease)   . H/O hiatal hernia   . History of colonic polyps   . Hyperlipidemia   . Hypertension   . Lung nodule 2011  . Peripheral vascular disease ([place])   . Shingles   . Stenosis of popliteal artery (HCC)    blood clots in legs long ago    . Trigeminal neuralgia     Family History  Problem Relation Age of Onset  . Arthritis Mother   . Hypertension Mother   . Heart disease Father   . Colon polyps Father   . Breast cancer Sister   . Lung cancer Sister   . Colon cancer Sister   .  Cancer Sister        Breast  . Cancer Daughter        Breast  . Hypertension Son   . Lung cancer Brother   . Cancer Brother        Lung  . Anesthesia problems Neg Hx   . Hypotension Neg Hx   . Malignant hyperthermia Neg Hx   . Pseudochol deficiency Neg Hx     Past Surgical History:  Procedure Laterality Date  . ABDOMINAL AORTAGRAM N/A 06/17/2011   Procedure: ABDOMINAL Maxcine Ham;  Surgeon: Elam Dutch, MD;  Location: [facility] CATH LAB;  Service: Cardiovascular;  Laterality: N/A;  . CHOLECYSTECTOMY  1998  . EYE SURGERY     cataracts removed- bilateral /w IOL  . FEMORAL-POPLITEAL BYPASS GRAFT        x2 surgeries 1990's & 2009  . INJECTION KNEE Right Aug. 2016   Gel injection for pain  . LUMBAR FUSION  07/06/2011  . TONSILLECTOMY     as a teenager   . TUBAL LIGATION     Social History   Occupational History    Employer: RETIRED  Tobacco Use  . Smoking status: Former Smoker    Packs/day: 2.00    Years: 30.00    Pack years: 60.00    Types: Cigarettes    Last attempt to quit: 06/18/1993    Years since quitting: 23.8  . Smokeless tobacco: Never Used  . Tobacco comment: QUIT IN 1995  Substance and Sexual Activity  . Alcohol use: No    Alcohol/week: 0.0 oz  . Drug use: No  . Sexual activity: Not on file

## 2017-04-27 ENCOUNTER — Ambulatory Visit (HOSPITAL_COMMUNITY)
Admission: RE | Admit: 2017-04-27 | Discharge: 2017-04-27 | Disposition: A | Discharge status: Home / Self Care | Payer: Medicare Other | Source: Ambulatory Visit | Attending: Family | Admitting: Family

## 2017-04-27 ENCOUNTER — Other Ambulatory Visit: Payer: Self-pay

## 2017-04-27 ENCOUNTER — Encounter: Payer: Self-pay | Admitting: Family

## 2017-04-27 ENCOUNTER — Ambulatory Visit (INDEPENDENT_AMBULATORY_CARE_PROVIDER_SITE_OTHER): Payer: Medicare Other | Admitting: Family

## 2017-04-27 VITALS — BP 151/86 | HR 67 | Temp 97.0°F | Resp 14 | Ht 60.0 in | Wt 170.0 lb

## 2017-04-27 DIAGNOSIS — Z87891 Personal history of nicotine dependence: Secondary | ICD-10-CM

## 2017-04-27 DIAGNOSIS — E785 Hyperlipidemia, unspecified: Secondary | ICD-10-CM | POA: Diagnosis not present

## 2017-04-27 DIAGNOSIS — Z95828 Presence of other vascular implants and grafts: Secondary | ICD-10-CM

## 2017-04-27 DIAGNOSIS — I779 Disorder of arteries and arterioles, unspecified: Secondary | ICD-10-CM

## 2017-04-27 DIAGNOSIS — Z9862 Peripheral vascular angioplasty status: Secondary | ICD-10-CM | POA: Insufficient documentation

## 2017-04-27 DIAGNOSIS — I10 Essential (primary) hypertension: Secondary | ICD-10-CM | POA: Diagnosis not present

## 2017-04-27 NOTE — Progress Notes (Signed)
[facility] [place]   CC: Follow up peripheral artery occlusive disease  History of Present Illness [name] [name] is a 84 y.o. female patient of Dr. Oneida Alar who is status post bilateral femoropopliteal bypass with [name] [phone] and 2009), she's also had thrombolysis angioplasty of the right femoropopliteal bypass in 2010, again on 10/13/13, and thrombectomy angioplasty right interpositional graft from above the knee to below the knee popliteal artery in 2010.  She returns today for follow up.  On 10/13/13 she underwent arteriogram by interventional radiology at [facility] of her right lower extremity. Her arteriogram revealed an occluded right femoral to below-knee popliteal bypass graft. Lysis was initiated through the left common femoral artery with a 66F sheath. Following her procedure, she was placed on a heparin drip and started on coumadin. Her left femoral sheath site had no active bleeding or hematoma. She had palpable dorsalis pedis pulses. She stayed in the hospital for an additional day after her lysis intervention due to deconditioning. After working with physical therapy, she was recommended to go home with a rolling walker. The remainder of the hospital course consisted of increasing mobilization and increasing intake of solids without difficulty. She was discharged home with home health.   She reports remarkable improvement since she has been receiving physical therapy with [facility] under the direction of Dr. Posey Pronto.  Her walking was limited by low back pain, she has had lumbar fusion in March, 2013, states she was told that she has arthritis in her back which was causing this pain.  She has been having pain in her right knee since 2016 which also limits her walking. She receives injections in the knee which helps.  She exercises her legs 1-2x/day with a seated pedaling device.   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   Pt states her blood  pressure usually runs 130-140/60's, states it goes up in a medical office.  She no longer has claudication symptoms with walking since the above vascular procedures, she denies non healing wounds.  She denies any history of stroke or TIA symptoms, denies cardiac problems.  She has been making a concerted effort to lose weight.   Pt Diabetic: No Pt smoker: former smoker, quit about 1995  Pt meds include:  Statin :Yes ASA: Yes Other anticoagulants/antiplatelets: coumadin started by Dr. Oneida Alar July, 2015, and chronically managed by her PCP, was Dr. Arnoldo Morale, then Dr. Yong Channel    Past Medical History:  Diagnosis Date  . Allergic rhinoconjunctivitis   . Anxiety    pt. managed- uses deep breathing   . Arthritis    low back , stenosis  . COLONIC POLYPS, RECURRENT 08/29/2006   2008 last colonoscopy. No further colonoscopy.     Marland Kitchen COPD (chronic obstructive pulmonary disease) ([place])   . Diverticulosis   . DVT (deep venous thrombosis) ([place])   . Eczema   . Environmental allergies    allergy shot- q friday in Dr. Janee Morn office. PFT's abnormal- recommended Spiriva to use preop & will d/c after surgery  . GERD (gastroesophageal reflux disease)   . H/O hiatal hernia   . History of colonic polyps   . Hyperlipidemia   . Hypertension   . Lung nodule 2011  . Peripheral vascular disease ([place])   . Shingles   . Stenosis of popliteal artery (HCC)    blood clots in legs long ago    . Trigeminal neuralgia     Social History Social History  Tobacco Use  . Smoking status: Former Smoker    Packs/day: 2.00    Years: 30.00    Pack years: 60.00    Types: Cigarettes    Last attempt to quit: 06/18/1993    Years since quitting: 23.8  . Smokeless tobacco: Never Used  . Tobacco comment: QUIT IN 1995  Substance Use Topics  . Alcohol use: No    Alcohol/week: 0.0 oz  . Drug use: No    Family History Family History  Problem Relation Age of Onset  . Arthritis Mother   . Hypertension  Mother   . Heart disease Father   . Colon polyps Father   . Breast cancer Sister   . Lung cancer Sister   . Colon cancer Sister   . Cancer Sister        Breast  . Cancer Daughter        Breast  . Hypertension Son   . Lung cancer Brother   . Cancer Brother        Lung  . Anesthesia problems Neg Hx   . Hypotension Neg Hx   . Malignant hyperthermia Neg Hx   . Pseudochol deficiency Neg Hx     Past Surgical History:  Procedure Laterality Date  . ABDOMINAL AORTAGRAM N/A 06/17/2011   Procedure: ABDOMINAL Maxcine Ham;  Surgeon: Elam Dutch, MD;  Location: [facility] CATH LAB;  Service: Cardiovascular;  Laterality: N/A;  . CHOLECYSTECTOMY  1998  . EYE SURGERY     cataracts removed- bilateral /w IOL  . FEMORAL-POPLITEAL BYPASS GRAFT        x2 surgeries 1990's & 2009  . INJECTION KNEE Right Aug. 2016   Gel injection for pain  . LUMBAR FUSION  07/06/2011  . TONSILLECTOMY     as a teenager   . TUBAL LIGATION      Allergies  Allergen Reactions  . Sulfa Antibiotics Other (See Comments)    Cold sweat light headed and disorientation  . Tiotropium Bromide Shortness Of Breath and Other (See Comments)    Sore throat also  . Tramadol Nausea And Vomiting  . Latex Itching and Rash    Current Outpatient Medications  Medication Sig Dispense Refill  . acetaminophen (TYLENOL) 500 MG tablet Take 500-1,500 mg by mouth 3 (three) times daily as needed for moderate pain. Reported on 08/31/2015    . aspirin EC 81 MG tablet Take 81 mg by mouth daily.    . calcium-vitamin D (OSCAL WITH D 500-200) 500-200 MG-UNIT per tablet Take 1 tablet by mouth daily.      . Cholecalciferol (VITAMIN D3) 1000 UNITS CAPS Take 1,000 Units by mouth daily.     . colchicine (COLCRYS) 0.6 MG tablet Take 2 pills at first sign gout flare, then another pill 2 hours later if still having pain. Then take once daily until pain resolves. 30 tablet 0  . diclofenac sodium (VOLTAREN) 1 % GEL Apply 2 g topically 4 (four) times daily. 1  Tube 1  . DULoxetine (CYMBALTA) 60 MG capsule TAKE 1 CAPSULE BY MOUTH ONCE DAILY 90 capsule 0  . hydrochlorothiazide (HYDRODIURIL) 25 MG tablet Take 1 tablet (25 mg total) by mouth daily. 90 tablet 3  . irbesartan (AVAPRO) 300 MG tablet Take 1 tablet (300 mg total) by mouth daily. 90 tablet 3  . methocarbamol (ROBAXIN) 500 MG tablet TAKE 1 TABLET BY MOUTH TWICE DAILY AS NEEDED FOR MUSCLE SPASM 60 tablet 1  . omeprazole (PRILOSEC) 20 MG capsule Take 20 mg by  mouth every morning.     . simvastatin (ZOCOR) 40 MG tablet TAKE ONE TABLET BY MOUTH ONCE DAILY AT 6 IN THE EVENING 90 tablet 2  . warfarin (COUMADIN) 5 MG tablet TAKE TWO AND ONE-HALF TABLETS BY MOUTH ON THURSDAY, AND THEN TAKE TWO TABLETS THE REST OF THE DAYS (Patient taking differently: TAKE 1 1/2 TABLETS BY MOUTH ON MONDAYS, AND THEN TAKE ONE TABLET ALL OTHER DAYS.) 180 tablet 1   No current facility-administered medications for this visit.     ROS: See HPI for pertinent positives and negatives.   Physical Examination  Vitals:   04/27/17 1044 04/27/17 1108  BP: (!) 148/67 (!) 151/86  Pulse: 67 67  Resp: 14   Temp: (!) 97 F (36.1 C)   TempSrc: Oral   SpO2: 96%   Weight: 170 lb (77.1 kg)   Height: 5' (1.524 m)    Body mass index is 33.2 kg/m.  General: A&O x 3, WDWN, obese female, appears younger than stated age.  Gait: normal Eyes: PERRLA.  Pulmonary: Respirations are non labored, CTAB, nowheezes, rales or rhonchi.  Cardiac: regular rhythm with occasional premature contractions, no detected murmur.   Carotid Bruits Left Right   Negative  Negative    Abdominal aortic pulse is not palpable.  Radial pulses: 2+ palpable and =.   VASCULAR EXAM: Extremitieswithout ischemic changes  without Gangrene; without open wounds.   LE Pulses  LEFT  RIGHT   FEMORAL  not palpable(obese) 1+ palpable   POPLITEAL  not palpable  not palpable  POSTERIOR TIBIAL  not palpable  not palpable    DORSALIS PEDIS ANTERIOR TIBIAL  2+palpable  2+palpable    Abdomen: soft, NT, no masses palpated.  Skin: no rashes, no ulcers.  Musculoskeletal: no muscle wasting or atrophy.  Neurologic: A&O X 3; Appropriate Affect ; SENSATION: normal; MOTOR FUNCTION: moving all extremities equally, motor strength 5/5 throughout. Speech is fluent/normal. CN 2-12 intact   ASSESSMENT: [name] [name] is a 84 y.o. female who is status post bilateral femoropopliteal bypass with [name] [phone] and 2009), she's also had thrombolysis angioplasty of the right femoropopliteal bypass in 2010, again on 10/13/13,and thrombectomy angioplasty right interpositional graft from above the knee to below the knee popliteal artery in 2010.  She no longer has claudication symptoms with walking since the above vascular procedures, but she probably does not walk very much since her walking is limited by back pain and right knee pain. She has no signs of ischemia in her feet/legs.  She states she has been doing daily seated leg exercises.  I discussed pt status with Dr. Oneida Alar after her 01-21-16 visit. He advised to just get ABIs due to lack of symptoms, advanced age, and some comorbidities, no longer obtain lower extremity arterial duplex.  But since that visit she has made remarkable improvement which she attributes to physical therapy with [facility]; she started this in January 2018 and continues. The significant improvement in her bilateral ankle-brachial indices reflects her remarkable improvement, she had moderate bilateral arterial occlusive disease, now has mild disease.     DATA  ABI (Date: 04/27/2017):  R:   ABI: 0.99 (was 0.92 on 07-21-16),   PT: bi  DP: bi  TBI:  0.61 (was 0.55)  L:   ABI: 0.96 (was 0.89),   PT: bi  DP: bi  TBI: 0.60 (was 0.67)   Improved bilateral ABI's, no significant disease bilaterally, all biphasic waveforms.   01-21-16 bilateral LE arterial duplex suggests  rightlower extremity bypass graft with proximal anastomosis stenosis of 50-70% (376 cm/s). Left LE arterial bypass graft with no evidence of restenosis; however, likely >50% stenosis of the left common femoral artery.  Possible greater than 50% stenosis of the left proximal CFA.    PLAN:  Continued daily seated leg exercises; consider adding senior water aerobics as discussed.  Based on the patient's vascular studies and examination, pt will return to clinic in 1 year with ABI's.  I advised her to notify us if she develops concerns re the circulation in her feet/legs.  I discussed in depth with the patient the nature of atherosclerosis, and emphasized the importance of maximal medical management including strict control of blood pressure, blood glucose, and lipid levels, obtaining regular exercise, and continued cessation of smoking.  The patient is aware that without maximal medical management the underlying atherosclerotic disease process will progress, limiting the benefit of any interventions.  The patient was given information about PAD including signs, symptoms, treatment, what symptoms should prompt the patient to seek immediate medical care, and risk reduction measures to take.  Clemon Chambers, RN, MSN, FNP-C [facility] [place] [facility] Phone: [phone]  Clinic MD: Chen/Early  04/27/17 11:26 AM

## 2017-04-27 NOTE — Progress Notes (Signed)
Vitals:   04/27/17 1044  BP: (!) 148/67  Pulse: 67  Resp: 14  Temp: (!) 97 F (36.1 C)  TempSrc: Oral  SpO2: 96%  Weight: 170 lb (77.1 kg)  Height: 5' (1.524 m)

## 2017-04-27 NOTE — Patient Instructions (Signed)

## 2017-05-19 ENCOUNTER — Ambulatory Visit (INDEPENDENT_AMBULATORY_CARE_PROVIDER_SITE_OTHER): Payer: Medicare Other | Admitting: General Practice

## 2017-05-19 DIAGNOSIS — Z7901 Long term (current) use of anticoagulants: Secondary | ICD-10-CM

## 2017-05-19 LAB — POCT INR: INR: 1.9

## 2017-05-19 NOTE — Patient Instructions (Addendum)
Pre visit review using our clinic review tool, if applicable. No additional management support is needed unless otherwise documented below in the visit note.  Take 1 1/2 today (1/25) and then continue to take 1 tablet daily except 1/2 on Wednesdays.  Re-check in 2 weeks.

## 2017-05-30 ENCOUNTER — Telehealth (INDEPENDENT_AMBULATORY_CARE_PROVIDER_SITE_OTHER): Payer: Self-pay | Admitting: Orthopaedic Surgery

## 2017-05-30 NOTE — Telephone Encounter (Signed)
Tried to call patient no answer. Could not leave a voicemail, it just kept ringing and ringing. Will try again later.

## 2017-05-30 NOTE — Telephone Encounter (Signed)
Patient called to see if Kathlee Nations can call her in regards to inj. Offered to sch appt and she insisted she speak w/Liz Randel Pigg

## 2017-05-31 ENCOUNTER — Other Ambulatory Visit (INDEPENDENT_AMBULATORY_CARE_PROVIDER_SITE_OTHER): Payer: Self-pay

## 2017-05-31 DIAGNOSIS — Z803 Family history of malignant neoplasm of breast: Secondary | ICD-10-CM | POA: Diagnosis not present

## 2017-05-31 DIAGNOSIS — Z1231 Encounter for screening mammogram for malignant neoplasm of breast: Secondary | ICD-10-CM | POA: Diagnosis not present

## 2017-05-31 MED ORDER — MELOXICAM 7.5 MG PO TABS: 7.5000 mg | ORAL_TABLET | Freq: Every day | ORAL | 0 refills | Status: DC

## 2017-05-31 NOTE — Telephone Encounter (Signed)
I called patient back  and she states she is still having pain. She would like another cortisone inj but I advised her its too early to get one.04/24/17 last cortisone inj & 03/03/17 last Synvisc inj. She is currently taking Tylenol twice a day.  She would like to know if there is anything she can do or take?

## 2017-05-31 NOTE — Telephone Encounter (Signed)
Sent Rx into pharm 

## 2017-05-31 NOTE — Telephone Encounter (Signed)
We can try meloxicam 7.5 mg daily prn.

## 2017-06-01 ENCOUNTER — Other Ambulatory Visit: Payer: Self-pay | Admitting: Physical Medicine & Rehabilitation

## 2017-06-02 ENCOUNTER — Ambulatory Visit (INDEPENDENT_AMBULATORY_CARE_PROVIDER_SITE_OTHER): Payer: Medicare Other | Admitting: General Practice

## 2017-06-02 DIAGNOSIS — Z7901 Long term (current) use of anticoagulants: Secondary | ICD-10-CM

## 2017-06-02 LAB — POCT INR: INR: 4

## 2017-06-02 NOTE — Patient Instructions (Addendum)
Pre visit review using our clinic review tool, if applicable. No additional management support is needed unless otherwise documented below in the visit note.  Skip coumadin today (2/8) and then continue to take 1 tablet daily except 1/2 on Wednesdays.  Re-check in 2 weeks.

## 2017-06-16 ENCOUNTER — Ambulatory Visit (INDEPENDENT_AMBULATORY_CARE_PROVIDER_SITE_OTHER): Payer: Medicare Other | Admitting: General Practice

## 2017-06-16 DIAGNOSIS — Z7901 Long term (current) use of anticoagulants: Secondary | ICD-10-CM | POA: Diagnosis not present

## 2017-06-16 LAB — POCT INR: INR: 2.5

## 2017-06-16 NOTE — Patient Instructions (Addendum)
Pre visit review using our clinic review tool, if applicable. No additional management support is needed unless otherwise documented below in the visit note.  Continue to take 1 tablet daily except 1/2 on Wednesdays.  Re-check in 4 weeks.

## 2017-06-27 ENCOUNTER — Ambulatory Visit: Payer: Medicare Other | Admitting: Family Medicine

## 2017-07-01 ENCOUNTER — Other Ambulatory Visit: Payer: Self-pay | Admitting: Family Medicine

## 2017-07-06 ENCOUNTER — Other Ambulatory Visit: Payer: Self-pay | Admitting: Family Medicine

## 2017-07-12 ENCOUNTER — Encounter: Payer: Medicare Other | Admitting: Physical Medicine & Rehabilitation

## 2017-07-14 ENCOUNTER — Ambulatory Visit (INDEPENDENT_AMBULATORY_CARE_PROVIDER_SITE_OTHER): Payer: Medicare Other | Admitting: General Practice

## 2017-07-14 DIAGNOSIS — Z7901 Long term (current) use of anticoagulants: Secondary | ICD-10-CM

## 2017-07-14 LAB — POCT INR: INR: 2.5

## 2017-07-14 NOTE — Patient Instructions (Addendum)
Pre visit review using our clinic review tool, if applicable. No additional management support is needed unless otherwise documented below in the visit note.  Continue to take 1 tablet daily except 1/2 on Wednesdays.  Re-check in 4 weeks.

## 2017-07-26 ENCOUNTER — Encounter: Payer: Self-pay | Admitting: Physical Medicine & Rehabilitation

## 2017-07-26 ENCOUNTER — Encounter: Payer: Medicare Other | Attending: Physical Medicine & Rehabilitation | Admitting: Physical Medicine & Rehabilitation

## 2017-07-26 VITALS — BP 162/79 | HR 69 | Resp 14 | Ht 59.0 in | Wt 180.0 lb

## 2017-07-26 DIAGNOSIS — K219 Gastro-esophageal reflux disease without esophagitis: Secondary | ICD-10-CM | POA: Insufficient documentation

## 2017-07-26 DIAGNOSIS — G8929 Other chronic pain: Secondary | ICD-10-CM

## 2017-07-26 DIAGNOSIS — M791 Myalgia, unspecified site: Secondary | ICD-10-CM | POA: Insufficient documentation

## 2017-07-26 DIAGNOSIS — I10 Essential (primary) hypertension: Secondary | ICD-10-CM | POA: Insufficient documentation

## 2017-07-26 DIAGNOSIS — J449 Chronic obstructive pulmonary disease, unspecified: Secondary | ICD-10-CM | POA: Diagnosis not present

## 2017-07-26 DIAGNOSIS — E785 Hyperlipidemia, unspecified: Secondary | ICD-10-CM | POA: Diagnosis not present

## 2017-07-26 DIAGNOSIS — M25561 Pain in right knee: Secondary | ICD-10-CM

## 2017-07-26 DIAGNOSIS — M533 Sacrococcygeal disorders, not elsewhere classified: Secondary | ICD-10-CM

## 2017-07-26 DIAGNOSIS — Z86718 Personal history of other venous thrombosis and embolism: Secondary | ICD-10-CM | POA: Diagnosis not present

## 2017-07-26 DIAGNOSIS — I779 Disorder of arteries and arterioles, unspecified: Secondary | ICD-10-CM | POA: Diagnosis not present

## 2017-07-26 DIAGNOSIS — M545 Low back pain: Secondary | ICD-10-CM | POA: Insufficient documentation

## 2017-07-26 DIAGNOSIS — M792 Neuralgia and neuritis, unspecified: Secondary | ICD-10-CM

## 2017-07-26 DIAGNOSIS — M109 Gout, unspecified: Secondary | ICD-10-CM | POA: Insufficient documentation

## 2017-07-26 DIAGNOSIS — Z87891 Personal history of nicotine dependence: Secondary | ICD-10-CM | POA: Insufficient documentation

## 2017-07-26 DIAGNOSIS — G500 Trigeminal neuralgia: Secondary | ICD-10-CM | POA: Insufficient documentation

## 2017-07-26 DIAGNOSIS — M961 Postlaminectomy syndrome, not elsewhere classified: Secondary | ICD-10-CM | POA: Diagnosis not present

## 2017-07-26 DIAGNOSIS — I739 Peripheral vascular disease, unspecified: Secondary | ICD-10-CM | POA: Insufficient documentation

## 2017-07-26 DIAGNOSIS — M79604 Pain in right leg: Secondary | ICD-10-CM | POA: Insufficient documentation

## 2017-07-26 DIAGNOSIS — M419 Scoliosis, unspecified: Secondary | ICD-10-CM | POA: Insufficient documentation

## 2017-07-26 DIAGNOSIS — M1711 Unilateral primary osteoarthritis, right knee: Secondary | ICD-10-CM | POA: Insufficient documentation

## 2017-07-26 DIAGNOSIS — R269 Unspecified abnormalities of gait and mobility: Secondary | ICD-10-CM

## 2017-07-26 DIAGNOSIS — G894 Chronic pain syndrome: Secondary | ICD-10-CM

## 2017-07-26 DIAGNOSIS — G479 Sleep disorder, unspecified: Secondary | ICD-10-CM | POA: Diagnosis not present

## 2017-07-26 DIAGNOSIS — K449 Diaphragmatic hernia without obstruction or gangrene: Secondary | ICD-10-CM | POA: Diagnosis not present

## 2017-07-26 DIAGNOSIS — Z8371 Family history of colonic polyps: Secondary | ICD-10-CM | POA: Insufficient documentation

## 2017-07-26 MED ORDER — METHOCARBAMOL 500 MG PO TABS: ORAL_TABLET | ORAL | 2 refills | Status: DC

## 2017-07-26 MED ORDER — DULOXETINE HCL 60 MG PO CPEP: 60.0000 mg | ORAL_CAPSULE | Freq: Every day | ORAL | 2 refills | Status: DC

## 2017-07-26 NOTE — Progress Notes (Signed)
Subjective:    Patient ID: [name] [name], female    DOB: Jan 16, 1925, 84 y.o.   MRN: [id number]  HPI 84 y/o female with pmhpsh of PAD, COPD, DVT, HTN, Gout, trigeminal neuralgia, OA of right knee with injections, lumbar fusion present for follow up of pain in her back > right leg pain.   Initially stated: Pt stated she has had back pain "all her life".  Getting progressively worse.  Movement improves the pain.  Standing and walking exacerbate the pain. Dull pain.  Non-radiating.  Intermittent.  Denies associated numbness, tingling, weakness.  Lidoderm patch help. Pain limits pt from doing things around the house.   Last clinic visit 04/23/17.  Since last visit, pt states she has been doing well.  She notes improvement in pain, but has some difficulties with changes in weather.  She is no longer using Lidoderm patches, TENS. She continues with HEP.  Seldomly uses brace. She is taking Cymbalta.  She is taking Robaxin BID. Sleep has improved overall. She is now using cane, denies falls. She believes her balance has improved. Voltaren gel helps with her right knee.    Pain Inventory Average Pain 4 Pain Right Now 3 My pain is dull  In the last 24 hours, has pain interfered with the following? General activity 1 Relation with others 1 Enjoyment of life 3 What TIME of day is your pain at its worst? night, evening  Sleep (in general) Fair  Pain is worse with: walking and standing Pain improves with: rest Relief from Meds: 7  Mobility walk without assistance use a cane how many minutes can you walk? 2-3 ability to climb steps?  yes do you drive?  yes Do you have any goals in this area?  yes  Function retired I need assistance with the following:  household duties  Neuro/Psych bladder control problems  Prior Studies Any changes since last visit?  no  Physicians involved in your care Any changes since last visit?  no   Family History  Problem Relation Age of Onset  .  Arthritis Mother   . Hypertension Mother   . Heart disease Father   . Colon polyps Father   . Breast cancer Sister   . Lung cancer Sister   . Colon cancer Sister   . Cancer Sister        Breast  . Cancer Daughter        Breast  . Hypertension Son   . Lung cancer Brother   . Cancer Brother        Lung  . Anesthesia problems Neg Hx   . Hypotension Neg Hx   . Malignant hyperthermia Neg Hx   . Pseudochol deficiency Neg Hx    Social History   Socioeconomic History  . Marital status: Single    Spouse name: Not on file  . Number of children: 4  . Years of education: 37  . Highest education level: Not on file  Occupational History    Employer: RETIRED  Social Needs  . Financial resource strain: Not on file  . Food insecurity:    Worry: Not on file    Inability: Not on file  . Transportation needs:    Medical: Not on file    Non-medical: Not on file  Tobacco Use  . Smoking status: Former Smoker    Packs/day: 2.00    Years: 30.00    Pack years: 60.00    Types: Cigarettes    Last attempt to  quit: 06/18/1993    Years since quitting: 24.1  . Smokeless tobacco: Never Used  . Tobacco comment: QUIT IN 1995  Substance and Sexual Activity  . Alcohol use: No    Alcohol/week: 0.0 oz  . Drug use: No  . Sexual activity: Not on file  Lifestyle  . Physical activity:    Days per week: Not on file    Minutes per session: Not on file  . Stress: Not on file  Relationships  . Social connections:    Talks on phone: Not on file    Gets together: Not on file    Attends religious service: Not on file    Active member of club or organization: Not on file    Attends meetings of clubs or organizations: Not on file    Relationship status: Not on file  Other Topics Concern  . Not on file  Social History Narrative   Patient is widowed with 4 children. 2 grandkids. Lives alone.    Patient is right handed.   Patient has high school education.   Patient drinks 1 cup daily.      Retired  from [employer] for 28 years, works 2 days a week until 2016.       Hobbies: volunteers at [employer], [place] from church visits people.       Past Surgical History:  Procedure Laterality Date  . ABDOMINAL AORTAGRAM N/A 06/17/2011   Procedure: ABDOMINAL Maxcine Ham;  Surgeon: Elam Dutch, MD;  Location: [facility] CATH LAB;  Service: Cardiovascular;  Laterality: N/A;  . CHOLECYSTECTOMY  1998  . EYE SURGERY     cataracts removed- bilateral /w IOL  . FEMORAL-POPLITEAL BYPASS GRAFT        x2 surgeries 1990's & 2009  . INJECTION KNEE Right Aug. 2016   Gel injection for pain  . LUMBAR FUSION  07/06/2011  . TONSILLECTOMY     as a teenager   . TUBAL LIGATION     Past Medical History:  Diagnosis Date  . Allergic rhinoconjunctivitis   . Anxiety    pt. managed- uses deep breathing   . Arthritis    low back , stenosis  . COLONIC POLYPS, RECURRENT 08/29/2006   2008 last colonoscopy. No further colonoscopy.     Marland Kitchen COPD (chronic obstructive pulmonary disease) ([place])   . Diverticulosis   . DVT (deep venous thrombosis) ([place])   . Eczema   . Environmental allergies    allergy shot- q friday in Dr. Janee Morn office. PFT's abnormal- recommended Spiriva to use preop & will d/c after surgery  . GERD (gastroesophageal reflux disease)   . H/O hiatal hernia   . History of colonic polyps   . Hyperlipidemia   . Hypertension   . Lung nodule 2011  . Peripheral vascular disease ([place])   . Shingles   . Stenosis of popliteal artery (HCC)    blood clots in legs long ago    . Trigeminal neuralgia    BP (!) 162/79 (BP Location: Left Arm, Patient Position: Sitting, Cuff Size: Normal)   Pulse 69   Resp 14   Ht 4\' 11"  (1.499 m)   Wt 180 lb (81.6 kg)   SpO2 94%   BMI 36.36 kg/m   Opioid Risk Score:   Fall Risk Score:  `1  Depression screen PHQ 2/9  Depression screen [facility] 2/9 02/22/2017 10/31/2016 06/16/2016 05/05/2016 09/14/2015 08/31/2015 08/11/2015  Decreased Interest 0 0 0 0 0 0 0  Down,  Depressed, Hopeless  0 0 0 0 0 0 0  PHQ - 2 Score 0 0 0 0 0 0 0  Altered sleeping - - - 1 - - -  Tired, decreased energy - - - 0 - - -  Change in appetite - - - 1 - - -  Feeling bad or failure about yourself  - - - 0 - - -  Trouble concentrating - - - 0 - - -  Moving slowly or fidgety/restless - - - 0 - - -  Suicidal thoughts - - - 0 - - -  PHQ-9 Score - - - 2 - - -  Some recent data might be hidden   Review of Systems  HENT: Negative.   Eyes: Negative.   Respiratory: Negative.   Cardiovascular: Negative.   Gastrointestinal: Negative.   Endocrine: Negative.        High blood pressure   Genitourinary: Negative.   Musculoskeletal: Positive for arthralgias and back pain.  Skin: Negative.   Allergic/Immunologic: Negative.   Neurological: Negative.   Hematological: Negative.   Psychiatric/Behavioral: Negative.   All other systems reviewed and are negative.     Objective:   Physical Exam Gen: NAD. Vital signs reviewed HENT: Normocephalic, Atraumatic Eyes: EOMI. No discharge. Cardio: RRR. No JVD. Pulm: B/l clear to auscultation.  Effort normal.  Abd: Nondistended, BS+ MSK:  Gait WNL.   +TTP b/l lumbosacral PSPs Neuro:   Strength  5/5 grossly throughout Skin: Intact. Warm and dry.    Assessment & Plan:  84 y/o female with pmhpsh of PAD, COPD, DVT, HTN, Gout, trigeminal neuralgia, OA of right knee with injections, lumbar fusion present for follow up of pain in her back > right leg pain.    1. Chronic low back pain with failed back syndrome   Xray of L-spine and right SI joint reviewed, SI joint unremarkable, degenerative changes in spine with right scoliosis.    Will avoid NSAIDs due to coumadin  Unable to tolerate Gabapentin  Cont heat  Cont tylenol, limit to 2000mg /day  Cont Lidoderm patches (occasionally uses)  Cont HEP, completed PT  Cont TENS unit PRN  Cont Cymbalta to 60 mg  Cont Robaxin 500 BID PRN  2. Possible Sacroiliitis on right  Xray unremakrable  Cont  bracing PRN  3. Sleep disturbance  Significantly improved  Cont melatonin  4. Neuropathic pain  See #1  5. Gait abnormality  Improved  Cont cane   Cont HEP  6. Myalgia  Will schedule for trigger point injections  7. Right knee pain  MRI reviewed from 2016, showing meniscal/cartilage damage  Receiving injections from Ortho, cont  Pensaid denied  Cont voltaren gel  Encouraged trail lidoderm on knee, reminded again

## 2017-07-27 ENCOUNTER — Telehealth: Payer: Self-pay | Admitting: *Deleted

## 2017-07-27 NOTE — Telephone Encounter (Signed)
Prior authorization for methocarbamol is Approved

## 2017-08-02 ENCOUNTER — Encounter: Payer: Self-pay | Admitting: Family Medicine

## 2017-08-02 ENCOUNTER — Ambulatory Visit (INDEPENDENT_AMBULATORY_CARE_PROVIDER_SITE_OTHER): Payer: Medicare Other | Admitting: Family Medicine

## 2017-08-02 VITALS — BP 132/84 | HR 82 | Temp 97.5°F | Ht 59.0 in | Wt 175.6 lb

## 2017-08-02 DIAGNOSIS — I10 Essential (primary) hypertension: Secondary | ICD-10-CM | POA: Diagnosis not present

## 2017-08-02 DIAGNOSIS — E785 Hyperlipidemia, unspecified: Secondary | ICD-10-CM | POA: Diagnosis not present

## 2017-08-02 DIAGNOSIS — I779 Disorder of arteries and arterioles, unspecified: Secondary | ICD-10-CM

## 2017-08-02 DIAGNOSIS — J449 Chronic obstructive pulmonary disease, unspecified: Secondary | ICD-10-CM

## 2017-08-02 DIAGNOSIS — R739 Hyperglycemia, unspecified: Secondary | ICD-10-CM

## 2017-08-02 DIAGNOSIS — J301 Allergic rhinitis due to pollen: Secondary | ICD-10-CM

## 2017-08-02 NOTE — Assessment & Plan Note (Signed)
S: weight down 2 lbs from last visit (was 177 last time here). She gets hypoglycemia on metformin.  Lab Results  Component Value Date   HGBA1C 6.2 06/22/2016  A/P: update a1c

## 2017-08-02 NOTE — Assessment & Plan Note (Signed)
S: doing ok without medication. Quit smoking 1994 but 40 pack years. No regular inhalers but detected by PFTs in 2013 A/P: continue without medication. No wheeze or shortness of breath to suggest COPD flare today

## 2017-08-02 NOTE — Assessment & Plan Note (Signed)
S: She has felt "so full of mucus" for last 6 days Coughing fair amount. Feels sinus congestion but minimal pain- has had some headaches with it. Most relief this morning- hot shower running in bathroom. Blowing out whitish yellow from nose and coughing stuff up.   Use to be on allergy shots with Dr. Annamaria Boots- wants to get referred back as feels she did much better overall when on those. . She stopped zyrtec 3 weeks ago as felt like wasn't helping anymore. claritin didn't help.  A/P: we will refer her to an allergist today- She asks for Dr. Neldon Mc. Advised her to trial xyzal until she gets in with Dr. Neldon Mc. Likely viral URI on top of allergies. Lungs clear today- we discussed if fever or shortness of breath to see [place] back ASAP

## 2017-08-02 NOTE — Patient Instructions (Addendum)
Health Maintenance Due  Topic Date Due  . TETANUS/TDAP-Informed patient to have it done at their pharmacy and to send our office a copy of it when it's done. 04/26/2015   Advised her to trial xyzal until she gets in with Dr. Neldon Mc. We will call you within a week or two about your referral to Dr. Neldon Mc. If you do not hear within 3 weeks, give [place] a call.   Blood pressure looks great today fortunately  Schedule a lab visit at the check out desk within 2 weeks. Return for future fasting labs meaning nothing but water after midnight please. Ok to take your medications with water.   You have a wellness visit in July- see me sometime after that within 4-6 months of your visit today

## 2017-08-02 NOTE — Assessment & Plan Note (Signed)
S: well controlled on simvastatin  Lab Results  Component Value Date   CHOL 134 09/10/2015   HDL 48.60 09/10/2015   LDLCALC 56 09/10/2015   LDLDIRECT 58.0 06/22/2016   TRIG 143.0 09/10/2015   CHOLHDL 3 09/10/2015   A/P: continue current medicine as long as LDL remains below 70

## 2017-08-02 NOTE — Assessment & Plan Note (Signed)
S: controlled on irbesartan 300mg  and hctz 25mg  (slightly high uric acid so have to watch for gout flares). Reports blood pressure was higher in final days before sister died but now improved BP Readings from Last 3 Encounters:  08/02/17 132/84  07/26/17 (!) 162/79  04/27/17 (!) 151/86  A/P: We discussed blood pressure goal of <140/90. Continue current meds

## 2017-08-02 NOTE — Progress Notes (Signed)
Subjective:  [name] [name] is a 84 y.o. year old very pleasant female patient who presents for/with See problem oriented charting ROS- cough, congestion, sneezing noted. Felt subjective fever a few days ago- no measured temperature. No chest pain or shortness of breath.    Past Medical History-  Patient Active Problem List   Diagnosis Date Noted  . Pain in joint, lower leg 08/28/2014    Priority: High  . Peripheral arterial disease ([place]) 06/29/2007    Priority: High  . Acute gout of left foot 03/22/2016    Priority: Medium  . Trigeminal neuralgia     Priority: Medium  . Chronic low back pain 09/13/2010    Priority: Medium  . COPD mixed type ([place]) 01/22/2010    Priority: Medium  . Hyperglycemia 01/06/2009    Priority: Medium  . Hyperlipidemia 10/23/2006    Priority: Medium  . Essential hypertension 10/23/2006    Priority: Medium  . ESOPHAGEAL STRICTURE 08/09/1993    Priority: Medium  . Encounter for therapeutic drug monitoring 10/21/2013    Priority: Low  . Syncope 12/29/2011    Priority: Low  . Allergic rhinitis due to pollen 06/25/2010    Priority: Low  . Solitary pulmonary nodule 01/12/2010    Priority: Low  . Osteopenia 01/06/2009    Priority: Low  . Eczema 11/26/2007    Priority: Low  . Long term (current) use of anticoagulants 02/03/2017  . Unilateral primary osteoarthritis, right knee 04/07/2016    Medications- reviewed and updated Current Outpatient Medications  Medication Sig Dispense Refill  . acetaminophen (TYLENOL) 500 MG tablet Take 500-1,500 mg by mouth 3 (three) times daily as needed for moderate pain. Reported on 08/31/2015    . aspirin EC 81 MG tablet Take 81 mg by mouth daily.    . calcium-vitamin D (OSCAL WITH D 500-200) 500-200 MG-UNIT per tablet Take 1 tablet by mouth daily.      . colchicine (COLCRYS) 0.6 MG tablet Take 2 pills at first sign gout flare, then another pill 2 hours later if still having pain. Then take once daily until pain  resolves. 30 tablet 0  . diclofenac sodium (VOLTAREN) 1 % GEL Apply 2 g topically 4 (four) times daily. 1 Tube 1  . DULoxetine (CYMBALTA) 60 MG capsule Take 1 capsule (60 mg total) by mouth daily. 30 capsule 2  . hydrochlorothiazide (HYDRODIURIL) 25 MG tablet Take 1 tablet (25 mg total) by mouth daily. 90 tablet 3  . irbesartan (AVAPRO) 300 MG tablet Take 1 tablet (300 mg total) by mouth daily. 90 tablet 3  . meloxicam (MOBIC) 7.5 MG tablet Take 1 tablet (7.5 mg total) by mouth daily. 30 tablet 0  . methocarbamol (ROBAXIN) 500 MG tablet TAKE 1 TABLET BY MOUTH TWICE DAILY AS NEEDED FOR MUSCLE SPASM 60 tablet 2  . omeprazole (PRILOSEC) 20 MG capsule Take 20 mg by mouth every morning.     Marland Kitchen SHINGRIX injection   0  . simvastatin (ZOCOR) 40 MG tablet TAKE 1 TABLET BY MOUTH ONCE DAILY AT  6  PM 90 tablet 2  . warfarin (COUMADIN) 5 MG tablet TAKE 2 TABLETS BY MOUTH ONCE DAILY EXCEPT  TAKE  2.5  TABLETS  ON  THURSDAY (Patient taking differently: TAKE 1 TABLETS BY MOUTH ONCE DAILY EXCEPT TAKE  1.5  TABLETS  ON  WEDNESDAY) 180 tablet 1   No current facility-administered medications for this visit.     Objective: BP 132/84 (BP Location: Left Arm, Patient Position: Sitting, Cuff  Size: Normal)   Pulse 82   Temp (!) 97.5 F (36.4 C) (Oral)   Ht 4\' 11"  (1.499 m)   Wt 175 lb 9.6 oz (79.7 kg)   SpO2 95%   BMI 35.47 kg/m  Gen: NAD, resting comfortably Pharynx with some drainage, some clear drainage from nose noted CV: RRR no murmurs rubs or gallops Lungs: CTAB no crackles, wheeze, rhonchi Abdomen: soft/nontender/nondistended/normal bowel sounds. Overweight Ext: trace edema Skin: warm, dry  Assessment/Plan:  Allergic rhinitis due to pollen S: She has felt "so full of mucus" for last 6 days Coughing fair amount. Feels sinus congestion but minimal pain- has had some headaches with it. Most relief this morning- hot shower running in bathroom. Blowing out whitish yellow from nose and coughing stuff up.    Use to be on allergy shots with Dr. Annamaria Boots- wants to get referred back as feels she did much better overall when on those. . She stopped zyrtec 3 weeks ago as felt like wasn't helping anymore. claritin didn't help.  A/P: we will refer her to an allergist today- She asks for Dr. Neldon Mc. Advised her to trial xyzal until she gets in with Dr. Neldon Mc. Likely viral URI on top of allergies. Lungs clear today- we discussed if fever or shortness of breath to see [place] back ASAP  Essential hypertension S: controlled on irbesartan 300mg  and hctz 25mg  (slightly high uric acid so have to watch for gout flares). Reports blood pressure was higher in final days before sister died but now improved BP Readings from Last 3 Encounters:  08/02/17 132/84  07/26/17 (!) 162/79  04/27/17 (!) 151/86  A/P: We discussed blood pressure goal of <140/90. Continue current meds  COPD mixed type S: doing ok without medication. Quit smoking 1994 but 40 pack years. No regular inhalers but detected by PFTs in 2013 A/P: continue without medication. No wheeze or shortness of breath to suggest COPD flare today   Hyperglycemia S: weight down 2 lbs from last visit (was 177 last time here). She gets hypoglycemia on metformin.  Lab Results  Component Value Date   HGBA1C 6.2 06/22/2016  A/P: update a1c  Hyperlipidemia S: well controlled on simvastatin  Lab Results  Component Value Date   CHOL 134 09/10/2015   HDL 48.60 09/10/2015   LDLCALC 56 09/10/2015   LDLDIRECT 58.0 06/22/2016   TRIG 143.0 09/10/2015   CHOLHDL 3 09/10/2015   A/P: continue current medicine as long as LDL remains below 70   Future Appointments  Date Time Provider [place]  08/09/2017 12:20 PM Jamse Arn, MD [facility]  08/17/2017  2:00 PM Jamse Arn, MD [facility]  08/18/2017  1:15 PM [facility]  08/31/2017  1:40 PM Jamse Arn, MD [facility]  11/01/2017 11:00 AM Williemae Area, RN  [facility]   No follow-ups on file.  Lab/Order associations: Seasonal allergic rhinitis due to pollen - Plan: Ambulatory referral to Allergy  Hyperlipidemia, unspecified hyperlipidemia type - Plan: CBC, Comprehensive metabolic panel, Lipid panel  Hyperglycemia - Plan: Hemoglobin A1c  Return precautions advised.  [name], MD

## 2017-08-07 ENCOUNTER — Encounter: Payer: Self-pay | Admitting: Family Medicine

## 2017-08-07 ENCOUNTER — Other Ambulatory Visit (INDEPENDENT_AMBULATORY_CARE_PROVIDER_SITE_OTHER): Payer: Medicare Other

## 2017-08-07 DIAGNOSIS — R739 Hyperglycemia, unspecified: Secondary | ICD-10-CM | POA: Diagnosis not present

## 2017-08-07 DIAGNOSIS — E785 Hyperlipidemia, unspecified: Secondary | ICD-10-CM | POA: Diagnosis not present

## 2017-08-07 DIAGNOSIS — N183 Chronic kidney disease, stage 3 unspecified: Secondary | ICD-10-CM | POA: Insufficient documentation

## 2017-08-07 LAB — COMPREHENSIVE METABOLIC PANEL
ALBUMIN: 4.2 g/dL (ref 3.5–5.2)
ALT: 7 U/L (ref 0–35)
AST: 9 U/L (ref 0–37)
Alkaline Phosphatase: 80 U/L (ref 39–117)
BUN: 22 mg/dL (ref 6–23)
CALCIUM: 9.4 mg/dL (ref 8.4–10.5)
CHLORIDE: 102 meq/L (ref 96–112)
CO2: 27 mEq/L (ref 19–32)
CREATININE: 1 mg/dL (ref 0.40–1.20)
GFR: 55.27 mL/min — AB (ref >60.00)
Glucose, Bld: 167 mg/dL — ABNORMAL HIGH (ref 70–99)
POTASSIUM: 4.7 meq/L (ref 3.5–5.1)
Sodium: 138 mEq/L (ref 135–145)
Total Bilirubin: 0.5 mg/dL (ref 0.2–1.2)
Total Protein: 6.5 g/dL (ref 6.0–8.3)

## 2017-08-07 LAB — LIPID PANEL
CHOL/HDL RATIO: 3
Cholesterol: 125 mg/dL (ref 0–200)
HDL: 47.5 mg/dL (ref >39.00)
LDL CALC: 41 mg/dL (ref 0–99)
NonHDL: 77.15
Triglycerides: 181 mg/dL — ABNORMAL HIGH (ref 0.0–149.0)
VLDL: 36.2 mg/dL (ref 0.0–40.0)

## 2017-08-07 LAB — CBC
HCT: 40.1 % (ref 36.0–46.0)
Hemoglobin: 13.8 g/dL (ref 12.0–15.0)
MCHC: 34.4 g/dL (ref 30.0–36.0)
MCV: 85 fl (ref 78.0–100.0)
PLATELETS: 260 10*3/uL (ref 150.0–400.0)
RBC: 4.71 Mil/uL (ref 3.87–5.11)
RDW: 13.5 % (ref 11.5–15.5)
WBC: 10.5 10*3/uL (ref 4.0–10.5)

## 2017-08-07 LAB — HEMOGLOBIN A1C: Hgb A1c MFr Bld: 6.5 % (ref 4.6–6.5)

## 2017-08-07 NOTE — Addendum Note (Signed)
Addended by: Kayren Eaves T on: 08/07/2017 08:55 AM   Modules accepted: Orders

## 2017-08-09 ENCOUNTER — Encounter: Payer: Medicare Other | Admitting: Physical Medicine & Rehabilitation

## 2017-08-09 ENCOUNTER — Encounter: Payer: Self-pay | Admitting: Physical Medicine & Rehabilitation

## 2017-08-09 VITALS — BP 118/77 | HR 79 | Resp 14 | Ht 59.0 in | Wt 175.0 lb

## 2017-08-09 DIAGNOSIS — M791 Myalgia, unspecified site: Secondary | ICD-10-CM | POA: Diagnosis not present

## 2017-08-09 DIAGNOSIS — M545 Low back pain: Secondary | ICD-10-CM | POA: Diagnosis not present

## 2017-08-09 DIAGNOSIS — G500 Trigeminal neuralgia: Secondary | ICD-10-CM | POA: Diagnosis not present

## 2017-08-09 DIAGNOSIS — M79604 Pain in right leg: Secondary | ICD-10-CM | POA: Diagnosis not present

## 2017-08-09 DIAGNOSIS — M1711 Unilateral primary osteoarthritis, right knee: Secondary | ICD-10-CM | POA: Diagnosis not present

## 2017-08-09 DIAGNOSIS — M25561 Pain in right knee: Secondary | ICD-10-CM | POA: Diagnosis not present

## 2017-08-09 DIAGNOSIS — M109 Gout, unspecified: Secondary | ICD-10-CM | POA: Diagnosis not present

## 2017-08-09 NOTE — Progress Notes (Signed)
Trigger point injection procedure note: Trigger Point Injection: Written consent was obtained for the patient. Trigger points were identified of bilateral thoracolumbar PSPs (x5). The areas were cleaned with alcohol, vapocoolant spray applied and each of  these trigger points were injected with 1 cc of 0.5% Marcaine. Needle draw back was performed. There were no complications from the procedure, and it was well tolerated.

## 2017-08-17 ENCOUNTER — Encounter (HOSPITAL_BASED_OUTPATIENT_CLINIC_OR_DEPARTMENT_OTHER): Payer: Medicare Other | Admitting: Physical Medicine & Rehabilitation

## 2017-08-17 ENCOUNTER — Encounter: Payer: Self-pay | Admitting: Physical Medicine & Rehabilitation

## 2017-08-17 VITALS — BP 149/98 | HR 61 | Resp 14 | Ht 59.0 in | Wt 174.0 lb

## 2017-08-17 DIAGNOSIS — M79604 Pain in right leg: Secondary | ICD-10-CM | POA: Diagnosis not present

## 2017-08-17 DIAGNOSIS — M791 Myalgia, unspecified site: Secondary | ICD-10-CM | POA: Diagnosis not present

## 2017-08-17 DIAGNOSIS — M109 Gout, unspecified: Secondary | ICD-10-CM | POA: Diagnosis not present

## 2017-08-17 DIAGNOSIS — G500 Trigeminal neuralgia: Secondary | ICD-10-CM | POA: Diagnosis not present

## 2017-08-17 DIAGNOSIS — M25561 Pain in right knee: Secondary | ICD-10-CM | POA: Diagnosis not present

## 2017-08-17 DIAGNOSIS — M545 Low back pain: Secondary | ICD-10-CM | POA: Diagnosis not present

## 2017-08-17 DIAGNOSIS — M1711 Unilateral primary osteoarthritis, right knee: Secondary | ICD-10-CM | POA: Diagnosis not present

## 2017-08-17 NOTE — Progress Notes (Signed)
Trigger point injection procedure note: Trigger Point Injection: Written consent was obtained for the patient. Trigger points were identified of bilateral thoracolumbar PSPs (x3). The areas were cleaned with alcohol, vapocoolant spray applied and each of  these trigger points were injected with 1 cc of 0.5% Marcaine. Needle draw back was performed. There were no complications from the procedure, and it was well tolerated.

## 2017-08-18 ENCOUNTER — Ambulatory Visit (INDEPENDENT_AMBULATORY_CARE_PROVIDER_SITE_OTHER): Payer: Medicare Other | Admitting: General Practice

## 2017-08-18 DIAGNOSIS — Z7901 Long term (current) use of anticoagulants: Secondary | ICD-10-CM | POA: Diagnosis not present

## 2017-08-18 LAB — POCT INR: INR: 1.8

## 2017-08-18 NOTE — Patient Instructions (Addendum)
Pre visit review using our clinic review tool, if applicable. No additional management support is needed unless otherwise documented below in the visit note.  Take 1 1/2 tablets today and then continue to take 1 tablet daily except 1/2 on Wednesdays.  Re-check in 3 weeks.

## 2017-08-29 DIAGNOSIS — Z961 Presence of intraocular lens: Secondary | ICD-10-CM | POA: Diagnosis not present

## 2017-08-29 DIAGNOSIS — H04123 Dry eye syndrome of bilateral lacrimal glands: Secondary | ICD-10-CM | POA: Diagnosis not present

## 2017-08-29 DIAGNOSIS — H40053 Ocular hypertension, bilateral: Secondary | ICD-10-CM | POA: Diagnosis not present

## 2017-08-29 DIAGNOSIS — H02831 Dermatochalasis of right upper eyelid: Secondary | ICD-10-CM | POA: Diagnosis not present

## 2017-08-29 DIAGNOSIS — H02834 Dermatochalasis of left upper eyelid: Secondary | ICD-10-CM | POA: Diagnosis not present

## 2017-08-29 DIAGNOSIS — H10413 Chronic giant papillary conjunctivitis, bilateral: Secondary | ICD-10-CM | POA: Diagnosis not present

## 2017-08-31 ENCOUNTER — Encounter: Payer: Medicare Other | Attending: Physical Medicine & Rehabilitation | Admitting: Physical Medicine & Rehabilitation

## 2017-08-31 ENCOUNTER — Encounter: Payer: Self-pay | Admitting: Physical Medicine & Rehabilitation

## 2017-08-31 VITALS — BP 126/66 | HR 78 | Resp 14 | Ht 60.0 in | Wt 175.0 lb

## 2017-08-31 DIAGNOSIS — I10 Essential (primary) hypertension: Secondary | ICD-10-CM | POA: Diagnosis not present

## 2017-08-31 DIAGNOSIS — M791 Myalgia, unspecified site: Secondary | ICD-10-CM | POA: Diagnosis not present

## 2017-08-31 DIAGNOSIS — Z87891 Personal history of nicotine dependence: Secondary | ICD-10-CM | POA: Insufficient documentation

## 2017-08-31 DIAGNOSIS — R269 Unspecified abnormalities of gait and mobility: Secondary | ICD-10-CM

## 2017-08-31 DIAGNOSIS — E785 Hyperlipidemia, unspecified: Secondary | ICD-10-CM | POA: Insufficient documentation

## 2017-08-31 DIAGNOSIS — M961 Postlaminectomy syndrome, not elsewhere classified: Secondary | ICD-10-CM

## 2017-08-31 DIAGNOSIS — M79604 Pain in right leg: Secondary | ICD-10-CM | POA: Diagnosis not present

## 2017-08-31 DIAGNOSIS — Z86718 Personal history of other venous thrombosis and embolism: Secondary | ICD-10-CM | POA: Insufficient documentation

## 2017-08-31 DIAGNOSIS — M419 Scoliosis, unspecified: Secondary | ICD-10-CM | POA: Diagnosis not present

## 2017-08-31 DIAGNOSIS — G500 Trigeminal neuralgia: Secondary | ICD-10-CM | POA: Insufficient documentation

## 2017-08-31 DIAGNOSIS — M25561 Pain in right knee: Secondary | ICD-10-CM | POA: Diagnosis not present

## 2017-08-31 DIAGNOSIS — M25571 Pain in right ankle and joints of right foot: Secondary | ICD-10-CM

## 2017-08-31 DIAGNOSIS — G8929 Other chronic pain: Secondary | ICD-10-CM

## 2017-08-31 DIAGNOSIS — I739 Peripheral vascular disease, unspecified: Secondary | ICD-10-CM | POA: Insufficient documentation

## 2017-08-31 DIAGNOSIS — M1711 Unilateral primary osteoarthritis, right knee: Secondary | ICD-10-CM | POA: Diagnosis not present

## 2017-08-31 DIAGNOSIS — M545 Low back pain: Secondary | ICD-10-CM | POA: Diagnosis not present

## 2017-08-31 DIAGNOSIS — Z8371 Family history of colonic polyps: Secondary | ICD-10-CM | POA: Insufficient documentation

## 2017-08-31 DIAGNOSIS — G894 Chronic pain syndrome: Secondary | ICD-10-CM

## 2017-08-31 DIAGNOSIS — G479 Sleep disorder, unspecified: Secondary | ICD-10-CM | POA: Insufficient documentation

## 2017-08-31 DIAGNOSIS — J449 Chronic obstructive pulmonary disease, unspecified: Secondary | ICD-10-CM | POA: Diagnosis not present

## 2017-08-31 DIAGNOSIS — K449 Diaphragmatic hernia without obstruction or gangrene: Secondary | ICD-10-CM | POA: Diagnosis not present

## 2017-08-31 DIAGNOSIS — K219 Gastro-esophageal reflux disease without esophagitis: Secondary | ICD-10-CM | POA: Insufficient documentation

## 2017-08-31 DIAGNOSIS — M109 Gout, unspecified: Secondary | ICD-10-CM | POA: Diagnosis not present

## 2017-08-31 DIAGNOSIS — I779 Disorder of arteries and arterioles, unspecified: Secondary | ICD-10-CM | POA: Diagnosis not present

## 2017-08-31 MED ORDER — LIDOCAINE 5 % EX PTCH: 1.0000 | MEDICATED_PATCH | CUTANEOUS | 1 refills | Status: DC

## 2017-08-31 NOTE — Progress Notes (Addendum)
Subjective:    Patient ID: [name] [name], female    DOB: 11-07-1924, 84 y.o.   MRN: [id number]  HPI 84 y/o female with pmhpsh of PAD, COPD, DVT, HTN, Gout, trigeminal neuralgia, post herpetic neuralgia, OA of right knee with injections, lumbar fusion present for follow up of pain in her back > right leg pain.   Initially stated: Pt stated she has had back pain "all her life".  Getting progressively worse.  Movement improves the pain.  Standing and walking exacerbate the pain. Dull pain.  Non-radiating.  Intermittent.  Denies associated numbness, tingling, weakness.  Lidoderm patch help. Pain limits pt from doing things around the house.   Last clinic visit 08/17/17.  Pt had trigger point injections at that time. She did not receive much benefit from the injection. However, her pain is not that bad.  She complains of right ankle pain today.  She denies inciting event.  States spontaneous onset last night.  Potential increase in citrus intake.  Lidoderm patches provide some relief.  Pain Inventory Average Pain 3 Pain Right Now 4 My pain is dull  In the last 24 hours, has pain interfered with the following? General activity 3 Relation with others 3 Enjoyment of life 3 What TIME of day is your pain at its worst? morning, evening  Sleep (in general) Fair  Pain is worse with: walking, bending and standing Pain improves with: nothing Relief from Meds: 0  Mobility walk without assistance use a cane how many minutes can you walk? 2-3 ability to climb steps?  yes do you drive?  yes Do you have any goals in this area?  yes  Function retired I need assistance with the following:  household duties  Neuro/Psych bladder control problems  Prior Studies Any changes since last visit?  no  Physicians involved in your care Any changes since last visit?  no   Family History  Problem Relation Age of Onset  . Arthritis Mother   . Hypertension Mother   . Heart disease Father   .  Colon polyps Father   . Breast cancer Sister   . Lung cancer Sister   . Colon cancer Sister   . Cancer Sister        Breast  . Cancer Daughter        Breast  . Hypertension Son   . Lung cancer Brother   . Cancer Brother        Lung  . Anesthesia problems Neg Hx   . Hypotension Neg Hx   . Malignant hyperthermia Neg Hx   . Pseudochol deficiency Neg Hx    Social History   Socioeconomic History  . Marital status: Single    Spouse name: Not on file  . Number of children: 4  . Years of education: 72  . Highest education level: Not on file  Occupational History    Employer: RETIRED  Social Needs  . Financial resource strain: Not on file  . Food insecurity:    Worry: Not on file    Inability: Not on file  . Transportation needs:    Medical: Not on file    Non-medical: Not on file  Tobacco Use  . Smoking status: Former Smoker    Packs/day: 2.00    Years: 30.00    Pack years: 60.00    Types: Cigarettes    Last attempt to quit: 06/18/1993    Years since quitting: 24.2  . Smokeless tobacco: Never Used  . Tobacco  comment: QUIT IN 1995  Substance and Sexual Activity  . Alcohol use: No    Alcohol/week: 0.0 oz  . Drug use: No  . Sexual activity: Not on file  Lifestyle  . Physical activity:    Days per week: Not on file    Minutes per session: Not on file  . Stress: Not on file  Relationships  . Social connections:    Talks on phone: Not on file    Gets together: Not on file    Attends religious service: Not on file    Active member of club or organization: Not on file    Attends meetings of clubs or organizations: Not on file    Relationship status: Not on file  Other Topics Concern  . Not on file  Social History Narrative   Patient is widowed with 4 children. 2 grandkids. Lives alone.    Patient is right handed.   Patient has high school education.   Patient drinks 1 cup daily.      Retired from [employer] for 28 years, works 2 days a week until 2016.         Hobbies: volunteers at [employer], [place] from church visits people.       Past Surgical History:  Procedure Laterality Date  . ABDOMINAL AORTAGRAM N/A 06/17/2011   Procedure: ABDOMINAL Maxcine Ham;  Surgeon: Elam Dutch, MD;  Location: [facility] CATH LAB;  Service: Cardiovascular;  Laterality: N/A;  . CHOLECYSTECTOMY  1998  . EYE SURGERY     cataracts removed- bilateral /w IOL  . FEMORAL-POPLITEAL BYPASS GRAFT        x2 surgeries 1990's & 2009  . INJECTION KNEE Right Aug. 2016   Gel injection for pain  . LUMBAR FUSION  07/06/2011  . TONSILLECTOMY     as a teenager   . TUBAL LIGATION     Past Medical History:  Diagnosis Date  . Allergic rhinoconjunctivitis   . Anxiety    pt. managed- uses deep breathing   . Arthritis    low back , stenosis  . COLONIC POLYPS, RECURRENT 08/29/2006   2008 last colonoscopy. No further colonoscopy.     Marland Kitchen COPD (chronic obstructive pulmonary disease) ([place])   . Diverticulosis   . DVT (deep venous thrombosis) ([place])   . Eczema   . Environmental allergies    allergy shot- q friday in Dr. Janee Morn office. PFT's abnormal- recommended Spiriva to use preop & will d/c after surgery  . GERD (gastroesophageal reflux disease)   . H/O hiatal hernia   . History of colonic polyps   . Hyperlipidemia   . Hypertension   . Lung nodule 2011  . Peripheral vascular disease ([place])   . Shingles   . Stenosis of popliteal artery ([facility])    blood clots in legs long ago    . Trigeminal neuralgia    BP 126/66 (BP Location: Right Arm, Patient Position: Sitting, Cuff Size: Normal)   Pulse 78   Resp 14   Ht 5' (1.524 m)   Wt 175 lb (79.4 kg)   SpO2 95%   BMI 34.18 kg/m   Opioid Risk Score:   Fall Risk Score:  `1  Depression screen PHQ 2/9  Depression screen [facility] 2/9 02/22/2017 10/31/2016 06/16/2016 05/05/2016 09/14/2015 08/31/2015 08/11/2015  Decreased Interest 0 0 0 0 0 0 0  Down, Depressed, Hopeless 0 0 0 0 0 0 0  PHQ - 2 Score 0 0 0 0 0 0 0  Altered sleeping - -  - 1 - - -  Tired, decreased energy - - - 0 - - -  Change in appetite - - - 1 - - -  Feeling bad or failure about yourself  - - - 0 - - -  Trouble concentrating - - - 0 - - -  Moving slowly or fidgety/restless - - - 0 - - -  Suicidal thoughts - - - 0 - - -  PHQ-9 Score - - - 2 - - -  Some recent data might be hidden   Review of Systems  HENT: Negative.   Eyes: Negative.   Respiratory: Negative.   Cardiovascular: Negative.   Gastrointestinal: Negative.   Endocrine: Negative.        High blood pressure   Genitourinary: Negative.   Musculoskeletal: Positive for arthralgias and back pain.  Skin: Negative.   Allergic/Immunologic: Negative.   Neurological: Negative.   Hematological: Negative.   Psychiatric/Behavioral: Negative.   All other systems reviewed and are negative.     Objective:   Physical Exam Gen: NAD. Vital signs reviewed HENT: Normocephalic, Atraumatic Eyes: EOMI. No discharge. Cardio: RRR. No JVD. Pulm: B/l clear to auscultation.  Effort normal.  Abd: Nondistended, BS+ MSK:  Gait antalgic.   +TTP b/l lumbosacral PSPs  +TTP right ankle  Right ankle edema Neuro:   Strength  5/5 grossly throughout (right ankle limited by pain) Skin: Intact. Warm and dry.    Assessment & Plan:  84 y/o female with pmhpsh of PAD, COPD, DVT, HTN, Gout, trigeminal neuralgia, post herpetic neuralgia, OA of right knee with injections, lumbar fusion present for follow up of pain in her back > right leg pain.    1. Chronic low back pain with failed back syndrome   Xray of L-spine and right SI joint reviewed, SI joint unremarkable, degenerative changes in spine with right scoliosis.    Will avoid NSAIDs due to coumadin  Unable to tolerate Gabapentin  Cont heat  Cont tylenol, limit to 2000mg /day  Cont Lidoderm patches (occasionally uses)  Cont HEP, completed PT  Cont TENS unit PRN  Cont Cymbalta to 60 mg  Cont Robaxin 500 BID PRN  2. Possible Sacroiliitis on right  Xray  unremakrable  Cont bracing PRN  3. Sleep disturbance  Significantly improved  Cont melatonin  4. Neuropathic pain  See #1  5. Gait abnormality  Improved  Cont cane   Cont HEP  6. Myalgia  Trigger point injections ineffective  7. Right knee pain  MRI reviewed from 2016, showing meniscal/cartilage damage  Receiving injections from Ortho, cont  [place] denied  Cont voltaren gel  Encouraged trail lidoderm on knee, reminded again  8. Right ankle pain  Possibly gout flare  Will order Lidoderm patches  Will order Xrays  Will order Uric acid  Pt states she has gout flare medication from previous incident, encouraged trial

## 2017-09-01 ENCOUNTER — Telehealth: Payer: Self-pay

## 2017-09-01 LAB — URIC ACID: URIC ACID: 8.2 mg/dL — AB (ref 2.5–7.1)

## 2017-09-01 NOTE — Telephone Encounter (Signed)
Pt called stating she was calling Dr. Serita Grit call

## 2017-09-01 NOTE — Telephone Encounter (Signed)
Called patient.  Discussed lab results.  Pt started on medications for gout.

## 2017-09-04 ENCOUNTER — Other Ambulatory Visit: Payer: Self-pay | Admitting: *Deleted

## 2017-09-04 MED ORDER — LIDOCAINE 5 % EX PTCH: 1.0000 | MEDICATED_PATCH | CUTANEOUS | 1 refills | Status: DC

## 2017-09-05 ENCOUNTER — Telehealth: Payer: Self-pay

## 2017-09-05 NOTE — Telephone Encounter (Signed)
Will file appeal with this new information with [facility]

## 2017-09-05 NOTE — Telephone Encounter (Signed)
Recieved Faxed/e-mailed request for a prior authorization for this patients lidoderm 5% patches.  Started prior auth on 09-04-17

## 2017-09-05 NOTE — Telephone Encounter (Signed)
She has a history of post-herpetic neuralgia.

## 2017-09-05 NOTE — Telephone Encounter (Signed)
Recieved a denial for the prior authorization for her lidoderm patches from her [employer] stating that the only clinical indication for this medication is:  Lidoderm (lidocaine patch 5%) is indicated for relief of pain associated with post-herpetic neuralgia.   Will forward to Dr. Posey Pronto for further guidance.

## 2017-09-08 ENCOUNTER — Ambulatory Visit (INDEPENDENT_AMBULATORY_CARE_PROVIDER_SITE_OTHER): Payer: Medicare Other | Admitting: General Practice

## 2017-09-08 ENCOUNTER — Ambulatory Visit: Payer: Medicare Other

## 2017-09-08 DIAGNOSIS — Z7901 Long term (current) use of anticoagulants: Secondary | ICD-10-CM | POA: Diagnosis not present

## 2017-09-08 LAB — POCT INR: INR: 1.9

## 2017-09-08 NOTE — Patient Instructions (Addendum)
Pre visit review using our clinic review tool, if applicable. No additional management support is needed unless otherwise documented below in the visit note.  Take 1 1/2 tablets today(5/17) and then take 1 tablet daily except 1 1/2 tablets on Wednesdays.  Marland Kitchen  Re-check in 2 weeks.

## 2017-09-12 ENCOUNTER — Encounter: Payer: Self-pay | Admitting: Allergy and Immunology

## 2017-09-12 ENCOUNTER — Ambulatory Visit (INDEPENDENT_AMBULATORY_CARE_PROVIDER_SITE_OTHER): Payer: Medicare Other | Admitting: Allergy and Immunology

## 2017-09-12 VITALS — BP 128/58 | HR 68 | Temp 97.9°F | Resp 20 | Ht 59.0 in | Wt 174.2 lb

## 2017-09-12 DIAGNOSIS — H1013 Acute atopic conjunctivitis, bilateral: Secondary | ICD-10-CM

## 2017-09-12 DIAGNOSIS — I779 Disorder of arteries and arterioles, unspecified: Secondary | ICD-10-CM | POA: Diagnosis not present

## 2017-09-12 DIAGNOSIS — H04123 Dry eye syndrome of bilateral lacrimal glands: Secondary | ICD-10-CM

## 2017-09-12 DIAGNOSIS — H1045 Other chronic allergic conjunctivitis: Secondary | ICD-10-CM | POA: Diagnosis not present

## 2017-09-12 DIAGNOSIS — J3089 Other allergic rhinitis: Secondary | ICD-10-CM

## 2017-09-12 MED ORDER — IPRATROPIUM BROMIDE 0.06 % NA SOLN: 2.0000 | Freq: Three times a day (TID) | NASAL | 5 refills | Status: DC

## 2017-09-12 NOTE — Progress Notes (Signed)
Dear Dr. Yong Channel,  Thank you for referring [name] [name] to the [facility] on 09/12/2017.   Below is a summation of this patient's evaluation and recommendations.  Thank you for your referral. I will keep you informed about this patient's response to treatment.   If you have any questions please do not hesitate to contact me.   Sincerely,  Jiles Prows, MD Allergy / Immunology [place]   ______________________________________________________________________    NEW PATIENT NOTE  Referring Provider: Marin Olp, MD Primary Provider: Marin Olp, MD Date of office visit: 09/12/2017    Subjective:   Chief Complaint:  [name] [name] (DOB: [birth year]) is a 84 y.o. female who presents to the clinic on 09/12/2017 with a chief complaint of Eye Problem (watery eyes); Sinus Problem (nasal congestion); Cough (chest congestion); and Eczema .     HPI: [name] [name] presents to this clinic in evaluation of allergies.  She is a long-term patient of Dr. Annamaria Boots who has treated her with immunotherapy in the past.  Her complaints revolve around constant rhinorrhea and watery eyes.  Her eyes are somewhat itchy and they are "sticky" in the morning.  She has seen Dr. Carolynn Sayers and she has seen [facility] regarding her eye issue.  She has had tear duct surgery and has been treated with a multitude of different eyedrops which do not help her.  Recently she was given Xyzal which may help with some of her eye watering but certainly this is still a major issue.  There is no obvious provoking factor giving rise to this issue.  She has had this issue for decades.  She has been given a diagnosis of dry eye in the past.  She also appears to have a problem with intermittent "chest congestion and laryngitis" about 1 time per year usually in the spring but she does not have a history of asthma or chronic respiratory  tract symptoms as she goes through the rest of the year without any significant respiratory symptoms.  Past Medical History:  Diagnosis Date  . Allergic rhinoconjunctivitis   . Anxiety    pt. managed- uses deep breathing   . Arthritis    low back , stenosis  . COLONIC POLYPS, RECURRENT 08/29/2006   2008 last colonoscopy. No further colonoscopy.     Marland Kitchen COPD (chronic obstructive pulmonary disease) ([place])   . Diverticulosis   . DVT (deep venous thrombosis) ([place])   . Eczema   . Environmental allergies    allergy shot- q friday in Dr. Janee Morn office. PFT's abnormal- recommended Spiriva to use preop & will d/c after surgery  . GERD (gastroesophageal reflux disease)   . H/O hiatal hernia   . History of colonic polyps   . Hyperlipidemia   . Hypertension   . Lung nodule 2011  . Peripheral vascular disease ([place])   . Shingles   . Stenosis of popliteal artery (HCC)    blood clots in legs long ago    . Trigeminal neuralgia     Past Surgical History:  Procedure Laterality Date  . ABDOMINAL AORTAGRAM N/A 06/17/2011   Procedure: ABDOMINAL Maxcine Ham;  Surgeon: Elam Dutch, MD;  Location: [facility] C[facility] LAB;  Service: Cardiovascular;  Laterality: N/A;  . CHOLECYSTECTOMY  1998  . EYE SURGERY     cataracts removed- bilateral /w IOL  . FEMORAL-POPLITEAL BYPASS GRAFT        x2  surgeries 1990's & 2009  . INJECTION KNEE Right Aug. 2016   Gel injection for pain  . LUMBAR FUSION  07/06/2011  . TONSILLECTOMY     as a teenager   . TUBAL LIGATION      Allergies as of 09/12/2017      Reactions   Sulfa Antibiotics Other (See Comments)   Cold sweat light headed and disorientation   Tiotropium Bromide Shortness Of Breath, Other (See Comments)   Sore throat also   Tramadol Nausea And Vomiting   Latex Itching, Rash      Medication List      acetaminophen 500 MG tablet Commonly known as:  TYLENOL Take 500-1,500 mg by mouth 3 (three) times daily as needed for moderate pain. Reported on 08/31/2015     aspirin EC 81 MG tablet Take 81 mg by mouth daily.   calcium-vitamin D 500-200 MG-UNIT tablet Commonly known as:  OSCAL WITH D Take 1 tablet by mouth daily.   colchicine 0.6 MG tablet Commonly known as:  COLCRYS Take 2 pills at first sign gout flare, then another pill 2 hours later if still having pain. Then take once daily until pain resolves.   diclofenac sodium 1 % Gel Commonly known as:  VOLTAREN Apply 2 g topically 4 (four) times daily.   DULoxetine 60 MG capsule Commonly known as:  CYMBALTA Take 1 capsule (60 mg total) by mouth daily.   hydrochlorothiazide 25 MG tablet Commonly known as:  HYDRODIURIL Take 1 tablet (25 mg total) by mouth daily.   irbesartan 300 MG tablet Commonly known as:  AVAPRO Take 1 tablet (300 mg total) by mouth daily.   levocetirizine 5 MG tablet Commonly known as:  XYZAL Take 5 mg by mouth every evening.   lidocaine 5 % Commonly known as:  LIDODERM Place 1 patch onto the skin daily. 12 hours on and 12 hours off.   meloxicam 7.5 MG tablet Commonly known as:  MOBIC Take 1 tablet (7.5 mg total) by mouth daily.   methocarbamol 500 MG tablet Commonly known as:  ROBAXIN TAKE 1 TABLET BY MOUTH TWICE DAILY AS NEEDED FOR MUSCLE SPASM   PRILOSEC 20 MG capsule Generic drug:  omeprazole Take 20 mg by mouth every morning.   SHINGRIX injection Generic drug:  Zoster Vaccine Adjuvanted   simvastatin 40 MG tablet Commonly known as:  ZOCOR TAKE 1 TABLET BY MOUTH ONCE DAILY AT  6  PM   warfarin 5 MG tablet Commonly known as:  COUMADIN Take as directed by the anticoagulation clinic. If you are unsure how to take this medication, talk to your nurse or doctor. Original instructions:  TAKE 2 TABLETS BY MOUTH ONCE DAILY EXCEPT  TAKE  2.5  TABLETS  ON  THURSDAY       Review of systems negative except as noted in HPI / PMHx or noted below:  Review of Systems  Constitutional: Negative.   HENT: Negative.   Eyes: Negative.   Respiratory:  Negative.   Cardiovascular: Negative.   Gastrointestinal: Negative.   Genitourinary: Negative.   Musculoskeletal: Negative.   Skin: Negative.   Neurological: Negative.   Endo/Heme/Allergies: Negative.   Psychiatric/Behavioral: Negative.     Family History  Problem Relation Age of Onset  . Arthritis Mother   . Hypertension Mother   . Heart disease Father   . Colon polyps Father   . Breast cancer Sister   . Lung cancer Sister   . Colon cancer Sister   . Cancer Sister  Breast  . Cancer Daughter        Breast  . Hypertension Son   . Lung cancer Brother   . Cancer Brother        Lung  . Anesthesia problems Neg Hx   . Hypotension Neg Hx   . Malignant hyperthermia Neg Hx   . Pseudochol deficiency Neg Hx     Social History   Socioeconomic History  . Marital status: Single    Spouse name: Not on file  . Number of children: 4  . Years of education: 87  . Highest education level: Not on file  Occupational History    Employer: RETIRED  Social Needs  . Financial resource strain: Not on file  . Food insecurity:    Worry: Not on file    Inability: Not on file  . Transportation needs:    Medical: Not on file    Non-medical: Not on file  Tobacco Use  . Smoking status: Former Smoker    Packs/day: 2.00    Years: 30.00    Pack years: 60.00    Types: Cigarettes    Last attempt to quit: 06/18/1993    Years since quitting: 24.2  . Smokeless tobacco: Never Used  . Tobacco comment: QUIT IN 1995  Substance and Sexual Activity  . Alcohol use: No    Alcohol/week: 0.0 oz  . Drug use: No  . Sexual activity: Not on file  Lifestyle  . Physical activity:    Days per week: Not on file    Minutes per session: Not on file  . Stress: Not on file  Relationships  . Social connections:    Talks on phone: Not on file    Gets together: Not on file    Attends religious service: Not on file    Active member of club or organization: Not on file    Attends meetings of clubs or  organizations: Not on file    Relationship status: Not on file  . Intimate partner violence:    Fear of current or ex partner: Not on file    Emotionally abused: Not on file    Physically abused: Not on file    Forced sexual activity: Not on file  Other Topics Concern  . Not on file  Social History Narrative   Patient is widowed with 4 children. 2 grandkids. Lives alone.    Patient is right handed.   Patient has high school education.   Patient drinks 1 cup daily.      Retired from [employer] for 28 years, works 2 days a week until 2016.       Hobbies: volunteers at [employer], [place] from church visits people.        Environmental and Social history  Lives in a house with a dry environment, carpet located inside the bedroom, no animals located inside the household, plastic on the bed, no plastic on the pillow, and no smokers located inside the household.  Objective:   Vitals:   09/12/17 0913  BP: (!) 128/58  Pulse: 68  Resp: 20  Temp: 97.9 F (36.6 C)   Height: 4\' 11"  (149.9 cm) Weight: 174 lb 3.2 oz (79 kg)  Physical Exam  HENT:  Head: Normocephalic. Head is without right periorbital erythema and without left periorbital erythema.  Right Ear: Tympanic membrane, external ear and ear canal normal.  Left Ear: Tympanic membrane, external ear and ear canal normal.  Nose: Nose normal. No mucosal edema or  rhinorrhea.  Mouth/Throat: Uvula is midline, oropharynx is clear and moist and mucous membranes are normal. No oropharyngeal exudate.  Eyes: Pupils are equal, round, and reactive to light. Conjunctivae and lids are normal.  Neck: Trachea normal. No tracheal tenderness present. No tracheal deviation present. No thyromegaly present.  Cardiovascular: Normal rate, regular rhythm, S1 normal, S2 normal and normal heart sounds.  No murmur heard. Pulmonary/Chest: Effort normal and breath sounds normal. No stridor. No respiratory distress. She has no wheezes. She  has no rales. She exhibits no tenderness.  Abdominal: Soft. She exhibits no distension and no mass. There is no hepatosplenomegaly. There is no tenderness. There is no rebound and no guarding.  Musculoskeletal: She exhibits no edema or tenderness.  Lymphadenopathy:       Head (right side): No tonsillar adenopathy present.       Head (left side): No tonsillar adenopathy present.    She has no cervical adenopathy.    She has no axillary adenopathy.  Neurological: She is alert.  Skin: Rash (Slight facial erythema involving cheeks and forehead with telangiectasia.) noted. She is not diaphoretic. No erythema. No pallor. Nails show no clubbing.    Diagnostics: Allergy skin tests were not performed secondary to the recent administration of an antihistamine.    Assessment and Plan:    1. Perennial allergic conjunctivitis of both eyes   2. Dry eye syndrome of both eyes   3. Other allergic rhinitis     1.  Allergen avoidance measures?  Check Area 2 aero allergen profile  2.  At bedtime use OTC Systane gel eyedrops  3.  Throughout the day can use OTC Systane eyedrops  4.  Can use nasal ipratropium 1-2 sprays each nostril every 6-8 hours  5.  Doxycycline?  6.  Return to clinic in 3 weeks or earlier if problem  Mirna appears to have significant inflammation of her conjunctiva and to some degree her airway and we will see if this is based upon an atopic immune system by checking an area 2 aero allergen profile.  I am somewhat suspicious that the bulk of her problem is secondary to dry eye and we will have her use a thick gel in her eyes at night and she can use a thinner lubricating drops throughout the day.  Given the fact that she does have some facial erythema and telangiectasia she may actually be having ocular rosacea and benefit from doxycycline.  However, she is using Coumadin and if we do make a decision to give her doxycycline in the future we will need to adjust her Coumadin level.  I  have given her some nasal ipratropium to help with her chronic rhinorrhea.  We will see how things go over the course of the next 3 weeks.  Jiles Prows, MD Allergy / Immunology [facility]

## 2017-09-12 NOTE — Patient Instructions (Addendum)
  1.  Allergen avoidance measures?  Check Area 2 aero allergen profile  2.  At bedtime use OTC Systane gel eyedrops  3.  Throughout the day can use OTC Systane eyedrops  4.  Can use nasal ipratropium 1-2 sprays each nostril every 6-8 hours  5.  Doxycycline?  6.  Return to clinic in 3 weeks or earlier if problem

## 2017-09-13 ENCOUNTER — Encounter: Payer: Self-pay | Admitting: Allergy and Immunology

## 2017-09-14 NOTE — Addendum Note (Signed)
Addended by: Herbie Drape on: 09/14/2017 08:32 AM   Modules accepted: Orders

## 2017-09-20 LAB — ALLERGENS W/TOTAL IGE AREA 2
Alternaria Alternata IgE: <0.1 kU/L
Common Silver Birch IgE: <0.1 kU/L
D Farinae IgE: <0.1 kU/L
IGE (IMMUNOGLOBULIN E), SERUM: 3 [IU]/mL — AB (ref 6–495)
Johnson Grass IgE: <0.1 kU/L
Maple/Box Elder IgE: <0.1 kU/L
Oak, White IgE: <0.1 kU/L
Pecan, Hickory IgE: <0.1 kU/L
Penicillium Chrysogen IgE: <0.1 kU/L
Pigweed, Rough IgE: <0.1 kU/L
Sheep Sorrel IgE Qn: <0.1 kU/L
Timothy Grass IgE: <0.1 kU/L
White Mulberry IgE: <0.1 kU/L

## 2017-09-21 ENCOUNTER — Telehealth (INDEPENDENT_AMBULATORY_CARE_PROVIDER_SITE_OTHER): Payer: Self-pay | Admitting: Orthopaedic Surgery

## 2017-09-21 NOTE — Telephone Encounter (Signed)
Returned call to patient left message to call back  [phone]

## 2017-09-21 NOTE — Addendum Note (Signed)
Addended by: Lucrezia Starch I on: 09/21/2017 04:17 PM   Modules accepted: Orders

## 2017-09-22 ENCOUNTER — Ambulatory Visit (INDEPENDENT_AMBULATORY_CARE_PROVIDER_SITE_OTHER): Payer: Medicare Other | Admitting: General Practice

## 2017-09-22 DIAGNOSIS — Z7901 Long term (current) use of anticoagulants: Secondary | ICD-10-CM

## 2017-09-22 LAB — POCT INR: INR: 1.6 — AB (ref 2.0–3.0)

## 2017-09-22 NOTE — Patient Instructions (Addendum)
Pre visit review using our clinic review tool, if applicable. No additional management support is needed unless otherwise documented below in the visit note.  Take 1 1/2 tablets today (5/31) and (6/1) and then take 1 tablet daily except 1 1/2 tablets Monday and Wednesdays.  Re-check in 1 week.

## 2017-09-26 ENCOUNTER — Ambulatory Visit (INDEPENDENT_AMBULATORY_CARE_PROVIDER_SITE_OTHER): Payer: Medicare Other

## 2017-09-26 ENCOUNTER — Ambulatory Visit (INDEPENDENT_AMBULATORY_CARE_PROVIDER_SITE_OTHER): Payer: Medicare Other | Admitting: Orthopaedic Surgery

## 2017-09-26 ENCOUNTER — Encounter (INDEPENDENT_AMBULATORY_CARE_PROVIDER_SITE_OTHER): Payer: Self-pay | Admitting: Orthopaedic Surgery

## 2017-09-26 DIAGNOSIS — M25571 Pain in right ankle and joints of right foot: Secondary | ICD-10-CM | POA: Diagnosis not present

## 2017-09-26 DIAGNOSIS — I779 Disorder of arteries and arterioles, unspecified: Secondary | ICD-10-CM

## 2017-09-26 NOTE — Progress Notes (Signed)
Office Visit Note   Patient: [name] [name]           Date of Birth: Jun 14, 1924           MRN: [id number] Visit Date: 09/26/2017              Requested by: Marin Olp, MD [place], [place] [address] PCP: Marin Olp, MD   Assessment & Plan: Visit Diagnoses:  1. Pain in right ankle and joints of right foot     Plan: Impression is right ankle peroneal tendinitis.  The patient is currently not having symptoms and does not feel as though her symptoms are bad enough when these do occur to be placed in a boot.  She is on Coumadin for her chronic blood clots.  We will send her to outpatient physical therapy for modalities and I will give her samples of pen said.  Follow-up with [place] as needed.  She will call with concerns or questions in the meantime  Follow-Up Instructions: Return if symptoms worsen or fail to improve.   Orders:  Orders Placed This Encounter  Procedures  . XR Ankle Complete Right   No orders of the defined types were placed in this encounter.     Procedures: No procedures performed   Clinical Data: No additional findings.   Subjective: Chief Complaint  Patient presents with  . Right Ankle - Pain    HPI patient is a pleasant 84 year old female who presents to our clinic today with concerns about her right ankle.  She started noticing pain and swelling to the lateral aspect of the right ankle approximately 4 to 5 months ago.  This occurs once every 1-1/2 weeks and last for 3 days.  Pain is worse with walking.  There was never an injury or change in activity.  This does get better with rest, ice and elevation.  She intermittently takes Tylenol as well.  She does have a history of chronic blood clots and utilizes a floor bike for 8 minutes twice a day.  Review of Systems as detailed in HPI.  All others reviewed and are negative.   Objective: Vital Signs: There were no vitals taken for this visit.  Physical Exam well-developed  and well-nourished female in no acute distress.  Alert and oriented x3.  Ortho Exam examination of the right ankle shows no swelling.  Moderate tenderness along the peroneal tendon just distal to the lateral malleolus.  Minimal diffuse tenderness to the fourth and fifth metatarsal shafts.  Full, active and pain-free range of motion of the right ankle.  She is neurovascularly intact distally.  Specialty Comments:  No specialty comments available.  Imaging: Xr Ankle Complete Right  Result Date: 09/26/2017 No acute structural abnormalities    PMFS History: Patient Active Problem List   Diagnosis Date Noted  . CKD (chronic kidney disease), stage III ([place]) 08/07/2017  . Long term (current) use of anticoagulants 02/03/2017  . Unilateral primary osteoarthritis, right knee 04/07/2016  . Acute gout of left foot 03/22/2016  . Pain in right ankle and joints of right foot 08/28/2014  . Trigeminal neuralgia   . Encounter for therapeutic drug monitoring 10/21/2013  . Syncope 12/29/2011  . Chronic low back pain 09/13/2010  . Allergic rhinitis due to pollen 06/25/2010  . COPD mixed type ([place]) 01/22/2010  . Solitary pulmonary nodule 01/12/2010  . Hyperglycemia 01/06/2009  . Osteopenia 01/06/2009  . Eczema 11/26/2007  . Peripheral arterial disease ([place]) 06/29/2007  .  Hyperlipidemia 10/23/2006  . Essential hypertension 10/23/2006  . ESOPHAGEAL STRICTURE 08/09/1993   Past Medical History:  Diagnosis Date  . Allergic rhinoconjunctivitis   . Anxiety    pt. managed- uses deep breathing   . Arthritis    low back , stenosis  . COLONIC POLYPS, RECURRENT 08/29/2006   2008 last colonoscopy. No further colonoscopy.     Marland Kitchen COPD (chronic obstructive pulmonary disease) ([place])   . Diverticulosis   . DVT (deep venous thrombosis) ([place])   . Eczema   . Environmental allergies    allergy shot- q friday in Dr. Janee Morn office. PFT's abnormal- recommended Spiriva to use preop & will d/c after surgery  . GERD  (gastroesophageal reflux disease)   . H/O hiatal hernia   . History of colonic polyps   . Hyperlipidemia   . Hypertension   . Lung nodule 2011  . Peripheral vascular disease ([place])   . Shingles   . Stenosis of popliteal artery (HCC)    blood clots in legs long ago    . Trigeminal neuralgia     Family History  Problem Relation Age of Onset  . Arthritis Mother   . Hypertension Mother   . Heart disease Father   . Colon polyps Father   . Breast cancer Sister   . Lung cancer Sister   . Colon cancer Sister   . Cancer Sister        Breast  . Cancer Daughter        Breast  . Hypertension Son   . Lung cancer Brother   . Cancer Brother        Lung  . Anesthesia problems Neg Hx   . Hypotension Neg Hx   . Malignant hyperthermia Neg Hx   . Pseudochol deficiency Neg Hx     Past Surgical History:  Procedure Laterality Date  . ABDOMINAL AORTAGRAM N/A 06/17/2011   Procedure: ABDOMINAL Maxcine Ham;  Surgeon: Elam Dutch, MD;  Location: [facility] CATH LAB;  Service: Cardiovascular;  Laterality: N/A;  . CHOLECYSTECTOMY  1998  . EYE SURGERY     cataracts removed- bilateral /w IOL  . FEMORAL-POPLITEAL BYPASS GRAFT        x2 surgeries 1990's & 2009  . INJECTION KNEE Right Aug. 2016   Gel injection for pain  . LUMBAR FUSION  07/06/2011  . TONSILLECTOMY     as a teenager   . TUBAL LIGATION     Social History   Occupational History    Employer: RETIRED  Tobacco Use  . Smoking status: Former Smoker    Packs/day: 2.00    Years: 30.00    Pack years: 60.00    Types: Cigarettes    Last attempt to quit: 06/18/1993    Years since quitting: 24.2  . Smokeless tobacco: Never Used  . Tobacco comment: QUIT IN 1995  Substance and Sexual Activity  . Alcohol use: No    Alcohol/week: 0.0 oz  . Drug use: No  . Sexual activity: Not on file

## 2017-09-28 ENCOUNTER — Encounter: Payer: Medicare Other | Attending: Physical Medicine & Rehabilitation | Admitting: Physical Medicine & Rehabilitation

## 2017-09-28 ENCOUNTER — Encounter: Payer: Self-pay | Admitting: Physical Medicine & Rehabilitation

## 2017-09-28 VITALS — BP 136/70 | HR 78 | Ht 59.0 in | Wt 173.0 lb

## 2017-09-28 DIAGNOSIS — M533 Sacrococcygeal disorders, not elsewhere classified: Secondary | ICD-10-CM

## 2017-09-28 DIAGNOSIS — M961 Postlaminectomy syndrome, not elsewhere classified: Secondary | ICD-10-CM | POA: Diagnosis not present

## 2017-09-28 DIAGNOSIS — M791 Myalgia, unspecified site: Secondary | ICD-10-CM | POA: Insufficient documentation

## 2017-09-28 DIAGNOSIS — M79604 Pain in right leg: Secondary | ICD-10-CM | POA: Diagnosis not present

## 2017-09-28 DIAGNOSIS — R269 Unspecified abnormalities of gait and mobility: Secondary | ICD-10-CM | POA: Diagnosis not present

## 2017-09-28 DIAGNOSIS — G479 Sleep disorder, unspecified: Secondary | ICD-10-CM | POA: Insufficient documentation

## 2017-09-28 DIAGNOSIS — I739 Peripheral vascular disease, unspecified: Secondary | ICD-10-CM | POA: Insufficient documentation

## 2017-09-28 DIAGNOSIS — J449 Chronic obstructive pulmonary disease, unspecified: Secondary | ICD-10-CM | POA: Insufficient documentation

## 2017-09-28 DIAGNOSIS — M792 Neuralgia and neuritis, unspecified: Secondary | ICD-10-CM | POA: Diagnosis not present

## 2017-09-28 DIAGNOSIS — M109 Gout, unspecified: Secondary | ICD-10-CM | POA: Diagnosis not present

## 2017-09-28 DIAGNOSIS — M25561 Pain in right knee: Secondary | ICD-10-CM | POA: Insufficient documentation

## 2017-09-28 DIAGNOSIS — K219 Gastro-esophageal reflux disease without esophagitis: Secondary | ICD-10-CM | POA: Insufficient documentation

## 2017-09-28 DIAGNOSIS — E785 Hyperlipidemia, unspecified: Secondary | ICD-10-CM | POA: Diagnosis not present

## 2017-09-28 DIAGNOSIS — Z86718 Personal history of other venous thrombosis and embolism: Secondary | ICD-10-CM | POA: Diagnosis not present

## 2017-09-28 DIAGNOSIS — M25571 Pain in right ankle and joints of right foot: Secondary | ICD-10-CM

## 2017-09-28 DIAGNOSIS — G500 Trigeminal neuralgia: Secondary | ICD-10-CM | POA: Diagnosis not present

## 2017-09-28 DIAGNOSIS — Z87891 Personal history of nicotine dependence: Secondary | ICD-10-CM | POA: Insufficient documentation

## 2017-09-28 DIAGNOSIS — M419 Scoliosis, unspecified: Secondary | ICD-10-CM | POA: Diagnosis not present

## 2017-09-28 DIAGNOSIS — G894 Chronic pain syndrome: Secondary | ICD-10-CM

## 2017-09-28 DIAGNOSIS — M1711 Unilateral primary osteoarthritis, right knee: Secondary | ICD-10-CM | POA: Insufficient documentation

## 2017-09-28 DIAGNOSIS — I779 Disorder of arteries and arterioles, unspecified: Secondary | ICD-10-CM | POA: Diagnosis not present

## 2017-09-28 DIAGNOSIS — M545 Low back pain: Secondary | ICD-10-CM | POA: Insufficient documentation

## 2017-09-28 DIAGNOSIS — I10 Essential (primary) hypertension: Secondary | ICD-10-CM | POA: Insufficient documentation

## 2017-09-28 DIAGNOSIS — Z8371 Family history of colonic polyps: Secondary | ICD-10-CM | POA: Insufficient documentation

## 2017-09-28 DIAGNOSIS — G8929 Other chronic pain: Secondary | ICD-10-CM

## 2017-09-28 DIAGNOSIS — K449 Diaphragmatic hernia without obstruction or gangrene: Secondary | ICD-10-CM | POA: Diagnosis not present

## 2017-09-28 NOTE — Progress Notes (Signed)
Subjective:    Patient ID: [name] [name], female    DOB: [birth year], 84 y.o.   MRN: [id number]  HPI 84 y/o female with pmhpsh of PAD, COPD, DVT, HTN, Gout, trigeminal neuralgia, post herpetic neuralgia, OA of right knee with injections, lumbar fusion present for follow up of pain in her back > right leg pain.   Initially stated: Pt stated she has had back pain "all her life".  Getting progressively worse.  Movement improves the pain.  Standing and walking exacerbate the pain. Dull pain.  Non-radiating.  Intermittent.  Denies associated numbness, tingling, weakness.  Lidoderm patch help. Pain limits pt from doing things around the house.   Last clinic visit 08/31/17.  Since last visit, pt states she is doing better. She states she saw Ortho and was told, likely ligamentous injury. Denies falls. She is using Voltaren gel to knee with benefit. Uric acid was elevated. She notes benefit gout medications.   Pain Inventory Average Pain 3 Pain Right Now 2 My pain is dull  In the last 24 hours, has pain interfered with the following? General activity 0 Relation with others 0 Enjoyment of life 0 What TIME of day is your pain at its worst? .,  Sleep (in general) Fair  Pain is worse with: walking, bending and standing Pain improves with: nothing Relief from Meds: 0  Mobility walk without assistance use a cane how many minutes can you walk? 2-3 ability to climb steps?  yes do you drive?  yes Do you have any goals in this area?  yes  Function retired I need assistance with the following:  household duties  Neuro/Psych bladder control problems trouble walking confusion  Prior Studies Any changes since last visit?  no  Physicians involved in your care Any changes since last visit?  no   Family History  Problem Relation Age of Onset  . Arthritis Mother   . Hypertension Mother   . Heart disease Father   . Colon polyps Father   . Breast cancer Sister   . Lung cancer Sister     . Colon cancer Sister   . Cancer Sister        Breast  . Cancer Daughter        Breast  . Hypertension Son   . Lung cancer Brother   . Cancer Brother        Lung  . Anesthesia problems Neg Hx   . Hypotension Neg Hx   . Malignant hyperthermia Neg Hx   . Pseudochol deficiency Neg Hx    Social History   Socioeconomic History  . Marital status: Single    Spouse name: Not on file  . Number of children: 4  . Years of education: 29  . Highest education level: Not on file  Occupational History    Employer: RETIRED  Social Needs  . Financial resource strain: Not on file  . Food insecurity:    Worry: Not on file    Inability: Not on file  . Transportation needs:    Medical: Not on file    Non-medical: Not on file  Tobacco Use  . Smoking status: Former Smoker    Packs/day: 2.00    Years: 30.00    Pack years: 60.00    Types: Cigarettes    Last attempt to quit: 06/18/1993    Years since quitting: 24.2  . Smokeless tobacco: Never Used  . Tobacco comment: QUIT IN 1995  Substance and Sexual Activity  .  Alcohol use: No    Alcohol/week: 0.0 oz  . Drug use: No  . Sexual activity: Not on file  Lifestyle  . Physical activity:    Days per week: Not on file    Minutes per session: Not on file  . Stress: Not on file  Relationships  . Social connections:    Talks on phone: Not on file    Gets together: Not on file    Attends religious service: Not on file    Active member of club or organization: Not on file    Attends meetings of clubs or organizations: Not on file    Relationship status: Not on file  Other Topics Concern  . Not on file  Social History Narrative   Patient is widowed with 4 children. 2 grandkids. Lives alone.    Patient is right handed.   Patient has high school education.   Patient drinks 1 cup daily.      Retired from [employer] for 28 years, works 2 days a week until 2016.       Hobbies: volunteers at [employer], [place] from church  visits people.       Past Surgical History:  Procedure Laterality Date  . ABDOMINAL AORTAGRAM N/A 06/17/2011   Procedure: ABDOMINAL Maxcine Ham;  Surgeon: Elam Dutch, MD;  Location: [facility] CATH LAB;  Service: Cardiovascular;  Laterality: N/A;  . CHOLECYSTECTOMY  1998  . EYE SURGERY     cataracts removed- bilateral /w IOL  . FEMORAL-POPLITEAL BYPASS GRAFT        x2 surgeries 1990's & 2009  . INJECTION KNEE Right Aug. 2016   Gel injection for pain  . LUMBAR FUSION  07/06/2011  . TONSILLECTOMY     as a teenager   . TUBAL LIGATION     Past Medical History:  Diagnosis Date  . Allergic rhinoconjunctivitis   . Anxiety    pt. managed- uses deep breathing   . Arthritis    low back , stenosis  . COLONIC POLYPS, RECURRENT 08/29/2006   2008 last colonoscopy. No further colonoscopy.     Marland Kitchen COPD (chronic obstructive pulmonary disease) ([place])   . Diverticulosis   . DVT (deep venous thrombosis) ([place])   . Eczema   . Environmental allergies    allergy shot- q friday in Dr. Janee Morn office. PFT's abnormal- recommended Spiriva to use preop & will d/c after surgery  . GERD (gastroesophageal reflux disease)   . H/O hiatal hernia   . History of colonic polyps   . Hyperlipidemia   . Hypertension   . Lung nodule 2011  . Peripheral vascular disease ([place])   . Shingles   . Stenosis of popliteal artery (HCC)    blood clots in legs long ago    . Trigeminal neuralgia    BP 136/70   Pulse 78   Ht 4\' 11"  (1.499 m)   Wt 173 lb (78.5 kg)   SpO2 95%   BMI 34.94 kg/m   Opioid Risk Score:   Fall Risk Score:  `1  Depression screen PHQ 2/9  Depression screen [facility] 2/9 02/22/2017 10/31/2016 06/16/2016 05/05/2016 09/14/2015 08/31/2015 08/11/2015  Decreased Interest 0 0 0 0 0 0 0  Down, Depressed, Hopeless 0 0 0 0 0 0 0  PHQ - 2 Score 0 0 0 0 0 0 0  Altered sleeping - - - 1 - - -  Tired, decreased energy - - - 0 - - -  Change in appetite - - -  1 - - -  Feeling bad or failure about yourself  - - - 0 - - -    Trouble concentrating - - - 0 - - -  Moving slowly or fidgety/restless - - - 0 - - -  Suicidal thoughts - - - 0 - - -  PHQ-9 Score - - - 2 - - -  Some recent data might be hidden   Review of Systems  HENT: Negative.   Eyes: Negative.   Respiratory: Negative.   Cardiovascular: Negative.   Gastrointestinal: Negative.   Endocrine: Negative.        High blood pressure   Genitourinary: Negative.   Musculoskeletal: Positive for arthralgias, back pain and gait problem.  Skin: Negative.   Allergic/Immunologic: Negative.   Hematological: Negative.   Psychiatric/Behavioral: Negative.   All other systems reviewed and are negative.     Objective:   Physical Exam Gen: NAD. Vital signs reviewed HENT: Normocephalic, Atraumatic Eyes: EOMI. No discharge. Cardio: RRR. No JVD. Pulm: B/l clear to auscultation.  Effort normal.  Abd: Nondistended, BS+ MSK:  Gait antalgic.   +Mild TTP b/l lumbosacral PSPs  +TTP right ankle  Right ankle edema Neuro:   Strength  5/5 grossly throughout (right ankle limited by pain) Skin: Intact. Warm and dry.    Assessment & Plan:  84 y/o female with pmhpsh of PAD, COPD, DVT, HTN, Gout, trigeminal neuralgia, post herpetic neuralgia, OA of right knee with injections, lumbar fusion present for follow up of pain in her back > right leg pain.    1. Chronic low back pain with failed back syndrome   Xray of L-spine and right SI joint reviewed, SI joint unremarkable, degenerative changes in spine with right scoliosis.    Will avoid NSAIDs due to coumadin  Unable to tolerate Gabapentin  Cont heat  Cont tylenol, limit to 2000mg /day  Cont Lidoderm patches (occasionally uses)  Cont HEP, completed PT  Cont TENS unit PRN  Cont Cymbalta to 60 mg  Cont Robaxin 500 BID PRN  2. Possible Sacroiliitis on right  Xray unremakrable  Cont bracing PRN  3. Sleep disturbance  Significantly improved  Cont melatonin  4. Neuropathic pain  See #1  5. Gait  abnormality  Improved  Cont cane   Cont HEP  6. Myalgia  Trigger point injections ineffective  7. Right knee pain  MRI reviewed from 2016, showing meniscal/cartilage damage  Receiving injections from Ortho, cont  [place] denied  Cont voltaren gel  Encouraged trail lidoderm on knee, reminded again, but states she has good benefit with Voltaren gel  8. Right ankle pain - likely gout flare  Lidoderm patches, pt using Voltaren gel  Xrays reviewed, relatively uremarkable  Uric acid elevated, discussed with patient over phone, encouraged trial of gout medications, which she received benefit from.    Improved at present

## 2017-09-29 ENCOUNTER — Ambulatory Visit (INDEPENDENT_AMBULATORY_CARE_PROVIDER_SITE_OTHER): Payer: Medicare Other | Admitting: General Practice

## 2017-09-29 DIAGNOSIS — Z7901 Long term (current) use of anticoagulants: Secondary | ICD-10-CM | POA: Diagnosis not present

## 2017-09-29 LAB — POCT INR: INR: 3.8 — AB (ref 2.0–3.0)

## 2017-09-29 NOTE — Patient Instructions (Addendum)
Pre visit review using our clinic review tool, if applicable. No additional management support is needed unless otherwise documented below in the visit note.  Hold coumadin today (6/7) and then take 1 tablet daily except 1 1/2 tablets Monday and Wednesdays.  Re-check in 3 weeks.

## 2017-10-04 ENCOUNTER — Ambulatory Visit (INDEPENDENT_AMBULATORY_CARE_PROVIDER_SITE_OTHER): Payer: Medicare Other | Admitting: Allergy and Immunology

## 2017-10-04 ENCOUNTER — Encounter: Payer: Self-pay | Admitting: Allergy and Immunology

## 2017-10-04 VITALS — BP 118/60 | HR 72 | Resp 16

## 2017-10-04 DIAGNOSIS — I779 Disorder of arteries and arterioles, unspecified: Secondary | ICD-10-CM | POA: Diagnosis not present

## 2017-10-04 DIAGNOSIS — K219 Gastro-esophageal reflux disease without esophagitis: Secondary | ICD-10-CM | POA: Diagnosis not present

## 2017-10-04 DIAGNOSIS — H04123 Dry eye syndrome of bilateral lacrimal glands: Secondary | ICD-10-CM | POA: Diagnosis not present

## 2017-10-04 DIAGNOSIS — J3089 Other allergic rhinitis: Secondary | ICD-10-CM

## 2017-10-04 MED ORDER — PANTOPRAZOLE SODIUM 40 MG PO TBEC: 40.0000 mg | DELAYED_RELEASE_TABLET | Freq: Two times a day (BID) | ORAL | 5 refills | Status: DC

## 2017-10-04 NOTE — Patient Instructions (Addendum)
  1.  Continue bedtime use of OTC Systane gel eyedrops  2.  Throughout the day can use OTC Systane eyedrops  3.  Can use nasal ipratropium 1-2 sprays each nostril every 6-8 hours  4.  Treat LPR:   A. Pantoprazole 40mg  - 1 tablet 2 times per day  B. Ranitidine 300mg  in evening  C. Consolidate caffeine  5. Doxycyline?  6.  Return to clinic in 4 weeks or earlier if problem

## 2017-10-04 NOTE — Progress Notes (Signed)
Follow-up Note  Referring Provider: Marin Olp, MD Primary Provider: Marin Olp, MD Date of Office Visit: 10/04/2017  Subjective:   [name] [name] (DOB: 1924-11-24) is a 84 y.o. female who returns to the [facility] [place] on 10/04/2017 in re-evaluation of the following:  HPI: [name] [name] returns to this clinic in evaluation of dry eye, chronic rhinitis, and a problem with cough.  I last saw her in this clinic during her initial evaluation of 12 Sep 2017 at which point in time we addressed her dry eyes and nasal issue.  Her eye is much better at nighttime utilizing very thick wetting solution prior to bed.  She still has runny eyes throughout the day.  She has been using nasal ipratropium 3 times per day which has resulting in better control of her chronic rhinorrhea.  She relates a history of having raspy voice that developes through the day and throat clearing and spells of cough.  This has been a prolonged issue of many years duration.  She does not have any associated shortness of breath or chest tightness.  Various irritants such as strong scents and perfumes and flowers precipitate this issue.  She does have reflux disease and has been treated with Prilosec for 30 years with a good result.  She drinks 1 coffee in the morning.  Allergies as of 10/04/2017      Reactions   Sulfa Antibiotics Other (See Comments)   Cold sweat light headed and disorientation   Tiotropium Bromide Shortness Of Breath, Other (See Comments)   Sore throat also   Tramadol Nausea And Vomiting   Latex Itching, Rash      Medication List      acetaminophen 500 MG tablet Commonly known as:  TYLENOL Take 500-1,500 mg by mouth 3 (three) times daily as needed for moderate pain. Reported on 08/31/2015   aspirin EC 81 MG tablet Take 81 mg by mouth daily.   calcium-vitamin D 500-200 MG-UNIT tablet Commonly known as:  OSCAL WITH D Take 1 tablet by mouth daily.   colchicine 0.6 MG  tablet Commonly known as:  COLCRYS Take 2 pills at first sign gout flare, then another pill 2 hours later if still having pain. Then take once daily until pain resolves.   cromolyn 4 % ophthalmic solution Commonly known as:  OPTICROM INSTILL 1 DROP INTO BOTH EYES 4 TIMES A DAY   diclofenac sodium 1 % Gel Commonly known as:  VOLTAREN Apply 2 g topically 4 (four) times daily.   DULoxetine 60 MG capsule Commonly known as:  CYMBALTA Take 1 capsule (60 mg total) by mouth daily.   hydrochlorothiazide 25 MG tablet Commonly known as:  HYDRODIURIL Take 1 tablet (25 mg total) by mouth daily.   ipratropium 0.06 % nasal spray Commonly known as:  ATROVENT Place 2 sprays into both nostrils 3 (three) times daily.   irbesartan 300 MG tablet Commonly known as:  AVAPRO Take 1 tablet (300 mg total) by mouth daily.   levocetirizine 5 MG tablet Commonly known as:  XYZAL Take 5 mg by mouth every evening.   lidocaine 5 % Commonly known as:  LIDODERM Place 1 patch onto the skin daily. 12 hours on and 12 hours off.   meloxicam 7.5 MG tablet Commonly known as:  MOBIC Take 1 tablet (7.5 mg total) by mouth daily.   methocarbamol 500 MG tablet Commonly known as:  ROBAXIN TAKE 1 TABLET BY MOUTH TWICE DAILY AS NEEDED FOR MUSCLE  SPASM   PRILOSEC 20 MG capsule Generic drug:  omeprazole Take 20 mg by mouth every morning.   SHINGRIX injection Generic drug:  Zoster Vaccine Adjuvanted   simvastatin 40 MG tablet Commonly known as:  ZOCOR TAKE 1 TABLET BY MOUTH ONCE DAILY AT  6  PM   warfarin 5 MG tablet Commonly known as:  COUMADIN Take as directed by the anticoagulation clinic. If you are unsure how to take this medication, talk to your nurse or doctor. Original instructions:  TAKE 2 TABLETS BY MOUTH ONCE DAILY EXCEPT  TAKE  2.5  TABLETS  ON  THURSDAY       Past Medical History:  Diagnosis Date  . Allergic rhinoconjunctivitis   . Anxiety    pt. managed- uses deep breathing   .  Arthritis    low back , stenosis  . COLONIC POLYPS, RECURRENT 08/29/2006   2008 last colonoscopy. No further colonoscopy.     Marland Kitchen COPD (chronic obstructive pulmonary disease) ([place])   . Diverticulosis   . DVT (deep venous thrombosis) ([place])   . Eczema   . Environmental allergies    allergy shot- q friday in Dr. Janee Morn office. PFT's abnormal- recommended Spiriva to use preop & will d/c after surgery  . GERD (gastroesophageal reflux disease)   . H/O hiatal hernia   . History of colonic polyps   . Hyperlipidemia   . Hypertension   . Lung nodule 2011  . Peripheral vascular disease ([place])   . Shingles   . Stenosis of popliteal artery (HCC)    blood clots in legs long ago    . Trigeminal neuralgia     Past Surgical History:  Procedure Laterality Date  . ABDOMINAL AORTAGRAM N/A 06/17/2011   Procedure: ABDOMINAL Maxcine Ham;  Surgeon: Elam Dutch, MD;  Location: [facility] C[facility] LAB;  Service: Cardiovascular;  Laterality: N/A;  . CHOLECYSTECTOMY  1998  . EYE SURGERY     cataracts removed- bilateral /w IOL  . FEMORAL-POPLITEAL BYPASS GRAFT        x2 surgeries 1990's & 2009  . INJECTION KNEE Right Aug. 2016   Gel injection for pain  . LUMBAR FUSION  07/06/2011  . TONSILLECTOMY     as a teenager   . TUBAL LIGATION      Review of systems negative except as noted in HPI / PMHx or noted below:  Review of Systems  Constitutional: Negative.   HENT: Negative.   Eyes: Negative.   Respiratory: Negative.   Cardiovascular: Negative.   Gastrointestinal: Negative.   Genitourinary: Negative.   Musculoskeletal: Negative.   Skin: Negative.   Neurological: Negative.   Endo/Heme/Allergies: Negative.   Psychiatric/Behavioral: Negative.      Objective:   Vitals:   10/04/17 1013  BP: 118/60  Pulse: 72  Resp: 16          Physical Exam  HENT:  Head: Normocephalic.  Right Ear: Tympanic membrane, external ear and ear canal normal.  Left Ear: Tympanic membrane, external ear and ear canal  normal.  Nose: Nose normal. No mucosal edema or rhinorrhea.  Mouth/Throat: Uvula is midline, oropharynx is clear and moist and mucous membranes are normal. No oropharyngeal exudate.  Eyes: Conjunctivae are normal.  Neck: Trachea normal. No tracheal tenderness present. No tracheal deviation present. No thyromegaly present.  Cardiovascular: Normal rate, regular rhythm, S1 normal, S2 normal and normal heart sounds.  No murmur heard. Pulmonary/Chest: Breath sounds normal. No stridor. No respiratory distress. She has no wheezes. She has no  rales.  Musculoskeletal: She exhibits no edema.  Lymphadenopathy:       Head (right side): No tonsillar adenopathy present.       Head (left side): No tonsillar adenopathy present.    She has no cervical adenopathy.  Neurological: She is alert.  Skin: No rash (Facial erythema and telangiectasia) noted. She is not diaphoretic. No erythema. Nails show no clubbing.    Diagnostics: Results of blood tests obtained 12 Sep 2017 identified serum IgE 3 IU/mL and no antigen specific IgE antibodies detected on a screening panel of aeroallergens.  Assessment and Plan:   1. Dry eye syndrome of both eyes   2. Other allergic rhinitis   3. LPRD (laryngopharyngeal reflux disease)     1.  Continue bedtime use of OTC Systane gel eyedrops  2.  Throughout the day can use OTC Systane eyedrops  3.  Can use nasal ipratropium 1-2 sprays each nostril every 6-8 hours  4.  Treat LPR:   A. Pantoprazole 40mg  - 1 tablet 2 times per day  B. Ranitidine 300mg  in evening  C. Consolidate caffeine  5.  Doxycyline?  6.  Return to clinic in 4 weeks or earlier if problem  Teona has had some improvement regarding her dry eyes and her chronic rhinorrhea on her current plan and she will continue to use Systane gel eyedrops and nasal ipratropium.  For now we will hold off on the administration of doxycycline and empiric therapy of possible ocular rosacea.  She also appears to have a  history very consistent with LPR and we will treat her aggressively for this condition as noted above for the next 4 weeks and make a decision about further evaluation and treatment based upon her response during this timeframe.  Allena Katz, MD Allergy / Immunology [place]

## 2017-10-05 ENCOUNTER — Other Ambulatory Visit: Payer: Self-pay

## 2017-10-05 ENCOUNTER — Encounter: Payer: Self-pay | Admitting: Allergy and Immunology

## 2017-10-05 MED ORDER — PANTOPRAZOLE SODIUM 40 MG PO TBEC: 40.0000 mg | DELAYED_RELEASE_TABLET | Freq: Two times a day (BID) | ORAL | 5 refills | Status: DC

## 2017-10-20 ENCOUNTER — Ambulatory Visit: Payer: Medicare Other | Admitting: General Practice

## 2017-10-20 DIAGNOSIS — Z7901 Long term (current) use of anticoagulants: Secondary | ICD-10-CM

## 2017-10-20 LAB — POCT INR: INR: 2.6 (ref 2.0–3.0)

## 2017-10-20 NOTE — Patient Instructions (Addendum)
Pre visit review using our clinic review tool, if applicable. No additional management support is needed unless otherwise documented below in the visit note.  Continue to take 1 tablet daily except 1 1/2 tablets Monday and Wednesdays.  Re-check in 4 weeks.

## 2017-10-31 NOTE — Progress Notes (Signed)
PCP notes:   Health maintenance: Up to date.    Abnormal screenings: None.   Patient concerns: None.   Nurse concerns: None.   Next PCP appt: 11/14/17

## 2017-10-31 NOTE — Progress Notes (Signed)
Subjective:   [name] [name] is a 84 y.o. female who presents for Medicare Annual (Subsequent) preventive examination.  Patient lives alone in one story home. States her sister passed away in 16-Jun-2022 and patient has been handling her estate. Patient still volunteering at hospital every week.  Review of Systems:  No ROS.  Medicare Wellness Visit. Additional risk factors are reflected in the social history.  Sleeps 7-8 hrs/night. Feels rested upon waking. Naps occasionally.  Cardiac Risk Factors include: advanced age (>45men, >2 women);hypertension;dyslipidemia     Objective:     Vitals: BP (!) 122/58   Pulse 65   Resp 16   Ht 4\' 11"  (1.499 m)   Wt 170 lb 6.4 oz (77.3 kg)   SpO2 96%   BMI 34.42 kg/m   Body mass index is 34.42 kg/m.  Advanced Directives 11/01/2017 02/22/2017 11/18/2016 10/31/2016 08/04/2016 07/21/2016 06/16/2016  Does Patient Have a Medical Advance Directive? No Yes Yes Yes Yes Yes Yes  Type of Advance Directive - Public librarian;Living will [place];Living will [place];Living will [place];Living will [place];Living will  Does patient want to make changes to medical advance directive? - - - No - Patient declined - - -  Copy of [place] in Chart? - - - No - copy requested - - No - copy requested  Would patient like information on creating a medical advance directive? No - Patient declined - - - - - -  Pre-existing out of facility DNR order (yellow form or pink MOST form) - - - - - - -    Tobacco Social History   Tobacco Use  Smoking Status Former Smoker  . Packs/day: 2.00  . Years: 30.00  . Pack years: 60.00  . Types: Cigarettes  . Last attempt to quit: 06/18/1993  . Years since quitting: 24.3  Smokeless Tobacco Never Used  Tobacco Comment   QUIT IN 1995     Counseling given: Not Answered Comment: [place]  Past Medical History:    Diagnosis Date  . Allergic rhinoconjunctivitis   . Anxiety    pt. managed- uses deep breathing   . Arthritis    low back , stenosis  . COLONIC POLYPS, RECURRENT 08/29/2006   2008 last colonoscopy. No further colonoscopy.     Marland Kitchen COPD (chronic obstructive pulmonary disease) ([place])   . Diverticulosis   . DVT (deep venous thrombosis) ([place])   . Eczema   . Environmental allergies    allergy shot- q friday in Dr. Janee Morn office. PFT's abnormal- recommended Spiriva to use preop & will d/c after surgery  . GERD (gastroesophageal reflux disease)   . H/O hiatal hernia   . History of colonic polyps   . Hyperlipidemia   . Hypertension   . Lung nodule 2011  . Peripheral vascular disease ([place])   . Shingles   . Stenosis of popliteal artery (HCC)    blood clots in legs long ago    . Trigeminal neuralgia    Past Surgical History:  Procedure Laterality Date  . ABDOMINAL AORTAGRAM N/A 06/17/2011   Procedure: ABDOMINAL Maxcine Ham;  Surgeon: Elam Dutch, MD;  Location: [facility] CATH LAB;  Service: Cardiovascular;  Laterality: N/A;  . CHOLECYSTECTOMY  1998  . EYE SURGERY     cataracts removed- bilateral /w IOL  . FEMORAL-POPLITEAL BYPASS GRAFT        x2 surgeries 1990's & 2009  .  INJECTION KNEE Right Aug. 2016   Gel injection for pain  . LUMBAR FUSION  07/06/2011  . TONSILLECTOMY     as a teenager   . TUBAL LIGATION     Family History  Problem Relation Age of Onset  . Arthritis Mother   . Hypertension Mother   . Heart disease Father   . Colon polyps Father   . Breast cancer Sister   . Lung cancer Sister   . Colon cancer Sister   . Cancer Sister        Breast  . Cancer Daughter        Breast  . Hypertension Son   . Lung cancer Brother   . Cancer Brother        Lung  . Anesthesia problems Neg Hx   . Hypotension Neg Hx   . Malignant hyperthermia Neg Hx   . Pseudochol deficiency Neg Hx    Social History   Socioeconomic History  . Marital status: Single    Spouse name: Not on file   . Number of children: 4  . Years of education: 58  . Highest education level: Not on file  Occupational History    Employer: RETIRED  Social Needs  . Financial resource strain: Not on file  . Food insecurity:    Worry: Not on file    Inability: Not on file  . Transportation needs:    Medical: Not on file    Non-medical: Not on file  Tobacco Use  . Smoking status: Former Smoker    Packs/day: 2.00    Years: 30.00    Pack years: 60.00    Types: Cigarettes    Last attempt to quit: 06/18/1993    Years since quitting: 24.3  . Smokeless tobacco: Never Used  . Tobacco comment: QUIT IN 1995  Substance and Sexual Activity  . Alcohol use: No    Alcohol/week: 0.0 oz  . Drug use: No  . Sexual activity: Not on file  Lifestyle  . Physical activity:    Days per week: Not on file    Minutes per session: Not on file  . Stress: Not on file  Relationships  . Social connections:    Talks on phone: Not on file    Gets together: Not on file    Attends religious service: Not on file    Active member of club or organization: Not on file    Attends meetings of clubs or organizations: Not on file    Relationship status: Not on file  Other Topics Concern  . Not on file  Social History Narrative   Patient is widowed with 4 children. 2 grandkids. Lives alone.    Patient is right handed.   Patient has high school education.   Patient drinks 1 cup daily.      Retired from [employer] for 28 years, works 2 days a week until 2016.       Hobbies: volunteers at [employer], [place] from church visits people.        Outpatient Encounter Medications as of 11/01/2017  Medication Sig  . acetaminophen (TYLENOL) 500 MG tablet Take 500-1,500 mg by mouth 3 (three) times daily as needed for moderate pain. Reported on 08/31/2015  . aspirin EC 81 MG tablet Take 81 mg by mouth daily.  . calcium-vitamin D (OSCAL WITH D 500-200) 500-200 MG-UNIT per tablet Take 1 tablet by mouth daily.    .  colchicine (COLCRYS) 0.6 MG tablet Take 2  pills at first sign gout flare, then another pill 2 hours later if still having pain. Then take once daily until pain resolves.  . cromolyn (OPTICROM) 4 % ophthalmic solution INSTILL 1 DROP INTO BOTH EYES 4 TIMES A DAY  . diclofenac sodium (VOLTAREN) 1 % GEL Apply 2 g topically 4 (four) times daily.  . hydrochlorothiazide (HYDRODIURIL) 25 MG tablet Take 1 tablet (25 mg total) by mouth daily.  Marland Kitchen ipratropium (ATROVENT) 0.06 % nasal spray Place 2 sprays into both nostrils 3 (three) times daily.  . irbesartan (AVAPRO) 300 MG tablet Take 1 tablet (300 mg total) by mouth daily.  Marland Kitchen levocetirizine (XYZAL) 5 MG tablet Take 5 mg by mouth every evening.  Marland Kitchen omeprazole (PRILOSEC) 20 MG capsule Take 20 mg by mouth every morning.   . pantoprazole (PROTONIX) 40 MG tablet Take 1 tablet (40 mg total) by mouth 2 (two) times daily.  Marland Kitchen SHINGRIX injection   . simvastatin (ZOCOR) 40 MG tablet TAKE 1 TABLET BY MOUTH ONCE DAILY AT  6  PM  . warfarin (COUMADIN) 5 MG tablet TAKE 2 TABLETS BY MOUTH ONCE DAILY EXCEPT  TAKE  2.5  TABLETS  ON  THURSDAY (Patient taking differently: TAKE 1 TABLETS BY MOUTH ONCE DAILY EXCEPT TAKE  1.5  TABLETS  ON  WEDNESDAY)   No facility-administered encounter medications on file as of 11/01/2017.     Activities of Daily Living In your present state of health, do you have any difficulty performing the following activities: 11/01/2017  Hearing? N  Vision? N  Difficulty concentrating or making decisions? N  Walking or climbing stairs? N  Dressing or bathing? N  Doing errands, shopping? N  Preparing Food and eating ? N  Using the Toilet? N  In the past six months, have you accidently leaked urine? Y  Comment Leaking with pressure.   Do you have problems with loss of bowel control? N  Managing your Medications? N  Managing your Finances? N  Housekeeping or managing your Housekeeping? N  Some recent data might be hidden    Patient Care  Team: Marin Olp, MD as PCP - General (Family Medicine) Leandrew Koyanagi, MD as Attending Physician (Orthopedic Surgery) Jamse Arn, MD as Consulting Physician (Physical Medicine and Rehabilitation) Warden Fillers, MD as Consulting Physician (Ophthalmology)    Assessment:   This is a routine wellness examination for [name] [name].  Exercise Activities and Dietary recommendations Current Exercise Habits: Home exercise routine, Type of exercise: Other - see comments(floor bicycle and hand weights), Time (Minutes): 30, Frequency (Times/Week): 7, Weekly Exercise (Minutes/Week): 210, Intensity: Mild, Exercise limited by: None identified  States she has been eating a lot better, avoids breads. Drinks 3-4 16oz water bottles. 1 cup coffee in morning. States she has cut back on her sweets over the past 6 months. States she will only eat 1 piece of candy or gets nutrition bars that are lower calorie to help with her sweet tooth. Drinks boost occasionally as well.  Breakfast: Oatmeal with fruit Lunch: Salad or nuts Dinner: Vegetables, chicken. Very little beef or pork.   Goals    . Maintain current health status       Fall Risk Fall Risk  11/01/2017 09/28/2017 08/31/2017 08/17/2017 07/26/2017  Falls in the past year? No No No No No  Number falls in past yr: - - - - -  Injury with Fall? - - - - -  Follow up - - - - -   Depression Screen  PHQ 2/9 Scores 11/01/2017 02/22/2017 10/31/2016 06/16/2016  PHQ - 2 Score 0 0 0 0  PHQ- 9 Score 0 - - -   PHQ9 completed. No signs or symptoms of depression. Patient lost her sister in January but has been coping well. Total time spent on topic was 9 minutes.   Cognitive Function Ad8 score reviewed for issues:  Issues making decisions:no  Less interest in hobbies / activities:no  Repeats questions, stories (family complaining):no  Trouble using ordinary gadgets (microwave, computer, phone):no  Forgets the month or year: no  Mismanaging finances:  no  Remembering appts:no  Daily problems with thinking and/or memory:no Ad8 score is=0         Immunization History  Administered Date(s) Administered  . Influenza Split 12/25/2011  . Influenza Whole 01/23/2010  . Influenza,inj,Quad PF,6+ Mos 12/31/2012, 12/26/2014, 01/08/2016  . Influenza-Unspecified 01/23/2014  . PPD Test 06/24/2011  . Pneumococcal Conjugate-13 08/31/2015  . Pneumococcal Polysaccharide-23 04/25/2005  . Td 04/25/2005  . Zoster 03/02/2010  . Zoster Recombinat (Shingrix) 04/20/2017, 06/29/2017   Screening Tests Health Maintenance  Topic Date Due  . TETANUS/TDAP  11/02/2018 (Originally 04/26/2015)  . INFLUENZA VACCINE  11/23/2017  . DEXA SCAN  Completed  . PNA vac Low Risk Adult  Completed       Plan:   Follow up with PCP as directed.  I have personally reviewed and noted the following in the patient's chart:   . Medical and social history . Use of alcohol, tobacco or illicit drugs  . Current medications and supplements . Functional ability and status . Nutritional status . Physical activity . Advanced directives . List of other physicians . Vitals . Screenings to include cognitive, depression, and falls . Referrals and appointments  In addition, I have reviewed and discussed with patient certain preventive protocols, quality metrics, and best practice recommendations. A written personalized care plan for preventive services as well as general preventive health recommendations were provided to patient.     Williemae Area, RN  11/01/2017

## 2017-11-01 ENCOUNTER — Ambulatory Visit (INDEPENDENT_AMBULATORY_CARE_PROVIDER_SITE_OTHER): Payer: Medicare Other | Admitting: *Deleted

## 2017-11-01 ENCOUNTER — Encounter: Payer: Self-pay | Admitting: *Deleted

## 2017-11-01 VITALS — BP 122/58 | HR 65 | Resp 16 | Ht 59.0 in | Wt 170.4 lb

## 2017-11-01 DIAGNOSIS — Z0000 Encounter for general adult medical examination without abnormal findings: Secondary | ICD-10-CM

## 2017-11-01 DIAGNOSIS — Z1331 Encounter for screening for depression: Secondary | ICD-10-CM | POA: Diagnosis not present

## 2017-11-01 NOTE — Patient Instructions (Addendum)
[name] [name] ,  Bring a copy of your living will and/or healthcare power of attorney to your next office visit.  Thank you for taking time to come for your Medicare Wellness Visit. I appreciate your ongoing commitment to your health goals. Please review the following plan we discussed and let me know if I can assist you in the future.   These are the goals we discussed: Goals    . Maintain current health status       This is a list of the screening recommended for you and due dates:  Health Maintenance  Topic Date Due  . Tetanus Vaccine  11/02/2018*  . Flu Shot  11/23/2017  . DEXA scan (bone density measurement)  Completed  . Pneumonia vaccines  Completed  *Topic was postponed. The date shown is not the original due date.   Preventive Care for Adults  A healthy lifestyle and preventive care can promote health and wellness. Preventive health guidelines for adults include the following key practices.  . A routine yearly physical is a good way to check with your health care provider about your health and preventive screening. It is a chance to share any concerns and updates on your health and to receive a thorough exam.  . Visit your dentist for a routine exam and preventive care every 6 months. Brush your teeth twice a day and floss once a day. Good oral hygiene prevents tooth decay and gum disease.  . The frequency of eye exams is based on your age, health, family medical history, use  of contact lenses, and other factors. Follow your health care provider's recommendations for frequency of eye exams.  . Eat a healthy diet. Foods like vegetables, fruits, whole grains, low-fat dairy products, and lean protein foods contain the nutrients you need without too many calories. Decrease your intake of foods high in solid fats, added sugars, and salt. Eat the right amount of calories for you. Get information about a proper diet from your health care provider, if necessary.  . Regular physical  exercise is one of the most important things you can do for your health. Most adults should get at least 150 minutes of moderate-intensity exercise (any activity that increases your heart rate and causes you to sweat) each week. In addition, most adults need muscle-strengthening exercises on 2 or more days a week.  Silver Sneakers may be a benefit available to you. To determine eligibility, you may visit the website: www.silversneakers.com or contact program at (508)682-6027 Mon-Fri between 8AM-8PM.   . Maintain a healthy weight. The body mass index (BMI) is a screening tool to identify possible weight problems. It provides an estimate of body fat based on height and weight. Your health care provider can find your BMI and can help you achieve or maintain a healthy weight.   For adults 20 years and older: ? A BMI below 18.5 is considered underweight. ? A BMI of 18.5 to 24.9 is normal. ? A BMI of 25 to 29.9 is considered overweight. ? A BMI of 30 and above is considered obese.   . Maintain normal blood lipids and cholesterol levels by exercising and minimizing your intake of saturated fat. Eat a balanced diet with plenty of fruit and vegetables. Blood tests for lipids and cholesterol should begin at age 64 and be repeated every 5 years. If your lipid or cholesterol levels are high, you are over 50, or you are at high risk for heart disease, you may need your cholesterol  levels checked more frequently. Ongoing high lipid and cholesterol levels should be treated with medicines if diet and exercise are not working.  . If you smoke, find out from your health care provider how to quit. If you do not use tobacco, please do not start.  . If you choose to drink alcohol, please do not consume more than 2 drinks per day. One drink is considered to be 12 ounces (355 mL) of beer, 5 ounces (148 mL) of wine, or 1.5 ounces (44 mL) of liquor.  . If you are 58-73 years old, ask your health care provider if you  should take aspirin to prevent strokes.  . Use sunscreen. Apply sunscreen liberally and repeatedly throughout the day. You should seek shade when your shadow is shorter than you. Protect yourself by wearing long sleeves, pants, a wide-brimmed hat, and sunglasses year round, whenever you are outdoors.  . Once a month, do a whole body skin exam, using a mirror to look at the skin on your back. Tell your health care provider of new moles, moles that have irregular borders, moles that are larger than a pencil eraser, or moles that have changed in shape or color.

## 2017-11-01 NOTE — Progress Notes (Signed)
I have reviewed and agree with note, evaluation, plan.   Tamiah Dysart, MD  

## 2017-11-06 ENCOUNTER — Ambulatory Visit: Payer: Medicare Other | Admitting: Family Medicine

## 2017-11-14 ENCOUNTER — Encounter: Payer: Self-pay | Admitting: Allergy and Immunology

## 2017-11-14 ENCOUNTER — Ambulatory Visit (INDEPENDENT_AMBULATORY_CARE_PROVIDER_SITE_OTHER): Payer: Medicare Other | Admitting: Allergy and Immunology

## 2017-11-14 ENCOUNTER — Encounter: Payer: Self-pay | Admitting: Family Medicine

## 2017-11-14 ENCOUNTER — Ambulatory Visit (INDEPENDENT_AMBULATORY_CARE_PROVIDER_SITE_OTHER): Payer: Medicare Other | Admitting: Family Medicine

## 2017-11-14 VITALS — BP 106/60 | HR 70 | Temp 97.6°F | Resp 16 | Ht 59.0 in | Wt 170.0 lb

## 2017-11-14 DIAGNOSIS — I10 Essential (primary) hypertension: Secondary | ICD-10-CM

## 2017-11-14 DIAGNOSIS — I779 Disorder of arteries and arterioles, unspecified: Secondary | ICD-10-CM

## 2017-11-14 DIAGNOSIS — R0989 Other specified symptoms and signs involving the circulatory and respiratory systems: Secondary | ICD-10-CM

## 2017-11-14 DIAGNOSIS — R739 Hyperglycemia, unspecified: Secondary | ICD-10-CM

## 2017-11-14 DIAGNOSIS — K219 Gastro-esophageal reflux disease without esophagitis: Secondary | ICD-10-CM | POA: Diagnosis not present

## 2017-11-14 DIAGNOSIS — J3089 Other allergic rhinitis: Secondary | ICD-10-CM

## 2017-11-14 DIAGNOSIS — E785 Hyperlipidemia, unspecified: Secondary | ICD-10-CM

## 2017-11-14 DIAGNOSIS — H04123 Dry eye syndrome of bilateral lacrimal glands: Secondary | ICD-10-CM

## 2017-11-14 DIAGNOSIS — E669 Obesity, unspecified: Secondary | ICD-10-CM | POA: Insufficient documentation

## 2017-11-14 NOTE — Assessment & Plan Note (Signed)
S: poorly controlled on last check with a1c trending up. Has had blurry vision on metformin in past- ? hypoglycemia. Weight down 5 lsb from april Exercise and diet- she has cut down on sweets. She does her exercises to help her leg with a little bike at home.  Last night checked sugar at 8 pm and was 107.  Lab Results  Component Value Date   HGBA1C 6.5 08/07/2017   HGBA1C 6.2 06/22/2016   HGBA1C 6.3 09/10/2015   A/P: we were holding off on labeling this diabetes unless had another a1c elevated at 6.5 or above- today, # is 5.8!!! Congratulated her on her progress.

## 2017-11-14 NOTE — Patient Instructions (Addendum)
  1.  Continue bedtime use of OTC Systane gel eyedrops if needed  2.  Throughout the day can use OTC Systane eyedrops if needed  3.  Can use nasal ipratropium 1-2 sprays each nostril every 6-8 hours if needed  4.  Continue to Treat LPR:   A. Pantoprazole 40mg  - 1 tablet 2 times per day  B. Ranitidine 300mg  in evening  C. Consolidate caffeine  5.  Obtain Modified Barium Swallow for choking spells  6.  Return to clinic in 8 weeks or earlier if problem

## 2017-11-14 NOTE — Assessment & Plan Note (Signed)
S: controlled on  irbesartan 300mg  and hctz 25mg  (would need to consider change if had gout flares with uric acid leel) BP Readings from Last 3 Encounters:  11/14/17 138/78  11/14/17 106/60  11/01/17 (!) 122/58  A/P: We discussed blood pressure goal of <140/90. Continue current meds:  Less edema in hands and feet she reports as she cut down on her salt. Encouraged her to continue low salt diet

## 2017-11-14 NOTE — Patient Instructions (Addendum)
You are still at risk for diabetes with hemoglobin a1c of 5.8 (at risk from 5.7-6.4). Healthy eating, regular exercise should be continued. If your # had been 6.5 again today as it was last time we would have diagnosed you as having diabetes- you have prevented yourself from being a diabetic!   We will update bloodwork next visit- you can be fasting if you want to if you can get an early morning visit

## 2017-11-14 NOTE — Progress Notes (Signed)
Subjective:  [name] [name] is a 84 y.o. year old very pleasant female patient who presents for/with See problem oriented charting ROS- No chest pain or shortness of breath. No headache or blurry vision.     Past Medical History-  Patient Active Problem List   Diagnosis Date Noted  . Pain in right ankle and joints of right foot 08/28/2014    Priority: High  . Peripheral arterial disease ([place]) 06/29/2007    Priority: High  . CKD (chronic kidney disease), stage III ([place]) 08/07/2017    Priority: Medium  . Acute gout of left foot 03/22/2016    Priority: Medium  . Trigeminal neuralgia     Priority: Medium  . Chronic low back pain 09/13/2010    Priority: Medium  . COPD mixed type ([place]) 01/22/2010    Priority: Medium  . Insulin resistance 01/06/2009    Priority: Medium  . Hyperlipidemia 10/23/2006    Priority: Medium  . Essential hypertension 10/23/2006    Priority: Medium  . ESOPHAGEAL STRICTURE 08/09/1993    Priority: Medium  . Obesity (BMI 30.0-34.9) 11/14/2017    Priority: Low  . Encounter for therapeutic drug monitoring 10/21/2013    Priority: Low  . Syncope 12/29/2011    Priority: Low  . Allergic rhinitis due to pollen 06/25/2010    Priority: Low  . Solitary pulmonary nodule 01/12/2010    Priority: Low  . Osteopenia 01/06/2009    Priority: Low  . Eczema 11/26/2007    Priority: Low  . Failed back syndrome 09/28/2017  . Long term (current) use of anticoagulants 02/03/2017  . Unilateral primary osteoarthritis, right knee 04/07/2016    Medications- reviewed and updated Current Outpatient Medications  Medication Sig Dispense Refill  . acetaminophen (TYLENOL) 500 MG tablet Take 500-1,500 mg by mouth 3 (three) times daily as needed for moderate pain. Reported on 08/31/2015    . aspirin EC 81 MG tablet Take 81 mg by mouth daily.    . calcium-vitamin D (OSCAL WITH D 500-200) 500-200 MG-UNIT per tablet Take 1 tablet by mouth daily.      . colchicine (COLCRYS) 0.6 MG tablet  Take 2 pills at first sign gout flare, then another pill 2 hours later if still having pain. Then take once daily until pain resolves. 30 tablet 0  . cromolyn (OPTICROM) 4 % ophthalmic solution INSTILL 1 DROP INTO BOTH EYES 4 TIMES A DAY  99  . diclofenac sodium (VOLTAREN) 1 % GEL Apply 2 g topically 4 (four) times daily. 1 Tube 1  . DULoxetine (CYMBALTA) 60 MG capsule Take 60 mg by mouth daily.  2  . hydrochlorothiazide (HYDRODIURIL) 25 MG tablet Take 1 tablet (25 mg total) by mouth daily. 90 tablet 3  . ipratropium (ATROVENT) 0.06 % nasal spray Place 2 sprays into both nostrils 3 (three) times daily. 15 mL 5  . irbesartan (AVAPRO) 300 MG tablet Take 1 tablet (300 mg total) by mouth daily. 90 tablet 3  . levocetirizine (XYZAL) 5 MG tablet Take 5 mg by mouth every evening.    . methocarbamol (ROBAXIN) 500 MG tablet TAKE 1 TABLET BY MOUTH TWICE DAILY AS NEEDED FOR MUSCLE SPASM  2  . omeprazole (PRILOSEC) 20 MG capsule Take 20 mg by mouth every morning.     . pantoprazole (PROTONIX) 40 MG tablet Take 1 tablet (40 mg total) by mouth 2 (two) times daily. 30 tablet 5  . SHINGRIX injection   0  . simvastatin (ZOCOR) 40 MG tablet TAKE 1 TABLET  BY MOUTH ONCE DAILY AT  6  PM 90 tablet 2  . warfarin (COUMADIN) 5 MG tablet TAKE 2 TABLETS BY MOUTH ONCE DAILY EXCEPT  TAKE  2.5  TABLETS  ON  THURSDAY (Patient taking differently: TAKE 1 TABLETS BY MOUTH ONCE DAILY EXCEPT TAKE  1.5  TABLETS  ON  WEDNESDAY) 180 tablet 1   No current facility-administered medications for this visit.     Objective: BP 138/78 (BP Location: Right Arm, Cuff Size: Normal)   Pulse 74   Temp 97.6 F (36.4 C) (Oral)   Ht 4\' 11"  (1.499 m)   Wt 171 lb 9.6 oz (77.8 kg)   SpO2 96%   BMI 34.66 kg/m  Gen: NAD, resting comfortably CV: RRR no murmurs rubs or gallops Lungs: CTAB no crackles, wheeze, rhonchi Abdomen: soft/nontender/nondistended/normal bowel sounds. No rebound or guarding.  Ext: no edema Skin: warm, dry Neuro: speech  normal, moves all extremities  Assessment/Plan:  Insulin resistance S: poorly controlled on last check with a1c trending up. Has had blurry vision on metformin in past- ? hypoglycemia. Weight down 5 lsb from april Exercise and diet- she has cut down on sweets. She does her exercises to help her leg with a little bike at home.  Last night checked sugar at 8 pm and was 107.  Lab Results  Component Value Date   HGBA1C 6.5 08/07/2017   HGBA1C 6.2 06/22/2016   HGBA1C 6.3 09/10/2015   A/P: we were holding off on labeling this diabetes unless had another a1c elevated at 6.5 or above- today, # is 5.8!!! Congratulated her on her progress.   Hyperlipidemia S:  controlled on simvastatin 40mg  Lab Results  Component Value Date   CHOL 125 08/07/2017   HDL 47.50 08/07/2017   LDLCALC 41 08/07/2017   LDLDIRECT 58.0 06/22/2016   TRIG 181.0 (H) 08/07/2017   CHOLHDL 3 08/07/2017   A/P: continue current rx with ldl goal under 70 due to arterial disease  Essential hypertension S: controlled on  irbesartan 300mg  and hctz 25mg  (would need to consider change if had gout flares with uric acid leel) BP Readings from Last 3 Encounters:  11/14/17 138/78  11/14/17 106/60  11/01/17 (!) 122/58  A/P: We discussed blood pressure goal of <140/90. Continue current meds:  Less edema in hands and feet she reports as she cut down on her salt. Encouraged her to continue low salt diet  Obesity (BMI 30.0-34.9) Down 5 lbs. Encouraged her to continue healthy lifestyle efforts  Future Appointments  Date Time Provider [place]  11/17/2017  1:30 PM [facility]  12/29/2017  3:00 PM Jamse Arn, MD [facility]  01/09/2018 11:15 AM Kozlow, Donnamarie Poag, MD [facility] None  11/06/2018 10:00 AM [facility]   Return in about 6 months (around 05/17/2018) for follow up- or sooner if needed.  Return precautions advised.  [name], MD

## 2017-11-14 NOTE — Progress Notes (Signed)
Follow-up Note  Referring Provider: Marin Olp, MD Primary Provider: Marin Olp, MD Date of Office Visit: 11/14/2017  Subjective:   [name] [name] (DOB: 01-10-1925) is a 84 y.o. female who returns to the [facility] [place] on 11/14/2017 in re-evaluation of the following:  HPI: [name] [name] returns to this clinic in reevaluation of her dry eye syndrome, chronic rhinitis, and cough believed secondary to LPR.  Her last visit to this clinic was 04 October 2017.  As noted with her last visit her eyes are much better.  She no longer has sticky eyelids in the morning.  She no longer needs to use any wetting solution at nighttime.  Nasal ipratropium has worked really well for her chronic rhinorrhea.  During her last visit we started her on aggressive therapy against reflux and she has noticed significant improvement regarding her raspy voice and throat clearing and intermittent cough.  She is drinking decaffeinated coffee and continues on a combination of pantoprazole and ranitidine.  She relates a history of developing choking spells when eating.  About 3 times a week she will undergo a very significant choking spell after food "goes the wrong way" that will last 3 to 5 minutes.  During this episode she cannot really speak that well because she is coughing and choking and throat clearing.  Allergies as of 11/14/2017      Reactions   Sulfa Antibiotics Other (See Comments)   Cold sweat light headed and disorientation   Tiotropium Bromide Shortness Of Breath, Other (See Comments)   Sore throat also   Tramadol Nausea And Vomiting   Latex Itching, Rash      Medication List      acetaminophen 500 MG tablet Commonly known as:  TYLENOL Take 500-1,500 mg by mouth 3 (three) times daily as needed for moderate pain. Reported on 08/31/2015   aspirin EC 81 MG tablet Take 81 mg by mouth daily.   calcium-vitamin D 500-200 MG-UNIT tablet Commonly known as:  OSCAL WITH D Take 1  tablet by mouth daily.   colchicine 0.6 MG tablet Commonly known as:  COLCRYS Take 2 pills at first sign gout flare, then another pill 2 hours later if still having pain. Then take once daily until pain resolves.   cromolyn 4 % ophthalmic solution Commonly known as:  OPTICROM INSTILL 1 DROP INTO BOTH EYES 4 TIMES A DAY   diclofenac sodium 1 % Gel Commonly known as:  VOLTAREN Apply 2 g topically 4 (four) times daily.   DULoxetine 60 MG capsule Commonly known as:  CYMBALTA Take 60 mg by mouth daily.   hydrochlorothiazide 25 MG tablet Commonly known as:  HYDRODIURIL Take 1 tablet (25 mg total) by mouth daily.   ipratropium 0.06 % nasal spray Commonly known as:  ATROVENT Place 2 sprays into both nostrils 3 (three) times daily.   irbesartan 300 MG tablet Commonly known as:  AVAPRO Take 1 tablet (300 mg total) by mouth daily.   levocetirizine 5 MG tablet Commonly known as:  XYZAL Take 5 mg by mouth every evening.   methocarbamol 500 MG tablet Commonly known as:  ROBAXIN TAKE 1 TABLET BY MOUTH TWICE DAILY AS NEEDED FOR MUSCLE SPASM   pantoprazole 40 MG tablet Commonly known as:  PROTONIX Take 1 tablet (40 mg total) by mouth 2 (two) times daily.   PRILOSEC 20 MG capsule Generic drug:  omeprazole Take 20 mg by mouth every morning.   SHINGRIX injection Generic drug:  Zoster Vaccine Adjuvanted   simvastatin 40 MG tablet Commonly known as:  ZOCOR TAKE 1 TABLET BY MOUTH ONCE DAILY AT  6  PM   warfarin 5 MG tablet Commonly known as:  COUMADIN Take as directed by the anticoagulation clinic. If you are unsure how to take this medication, talk to your nurse or doctor. Original instructions:  TAKE 2 TABLETS BY MOUTH ONCE DAILY EXCEPT  TAKE  2.5  TABLETS  ON  THURSDAY       Past Medical History:  Diagnosis Date  . Allergic rhinoconjunctivitis   . Anxiety    pt. managed- uses deep breathing   . Arthritis    low back , stenosis  . COLONIC POLYPS, RECURRENT 08/29/2006    2008 last colonoscopy. No further colonoscopy.     Marland Kitchen COPD (chronic obstructive pulmonary disease) ([place])   . Diverticulosis   . DVT (deep venous thrombosis) ([place])   . Eczema   . Environmental allergies    allergy shot- q friday in Dr. Janee Morn office. PFT's abnormal- recommended Spiriva to use preop & will d/c after surgery  . GERD (gastroesophageal reflux disease)   . H/O hiatal hernia   . History of colonic polyps   . Hyperlipidemia   . Hypertension   . Lung nodule 2011  . Peripheral vascular disease ([place])   . Shingles   . Stenosis of popliteal artery (HCC)    blood clots in legs long ago    . Trigeminal neuralgia     Past Surgical History:  Procedure Laterality Date  . ABDOMINAL AORTAGRAM N/A 06/17/2011   Procedure: ABDOMINAL Maxcine Ham;  Surgeon: Elam Dutch, MD;  Location: [facility] CATH LAB;  Service: Cardiovascular;  Laterality: N/A;  . CHOLECYSTECTOMY  1998  . EYE SURGERY     cataracts removed- bilateral /w IOL  . FEMORAL-POPLITEAL BYPASS GRAFT        x2 surgeries 1990's & 2009  . INJECTION KNEE Right Aug. 2016   Gel injection for pain  . LUMBAR FUSION  07/06/2011  . TONSILLECTOMY     as a teenager   . TUBAL LIGATION      Review of systems negative except as noted in HPI / PMHx or noted below:  Review of Systems  Constitutional: Negative.   HENT: Negative.   Eyes: Negative.   Respiratory: Negative.   Cardiovascular: Negative.   Gastrointestinal: Negative.   Genitourinary: Negative.   Musculoskeletal: Negative.   Skin: Negative.   Neurological: Negative.   Endo/Heme/Allergies: Negative.   Psychiatric/Behavioral: Negative.      Objective:   Vitals:   11/14/17 0957  BP: 106/60  Pulse: 70  Resp: 16  Temp: 97.6 F (36.4 C)  SpO2: 97%   Height: 4\' 11"  (149.9 cm)  Weight: 170 lb (77.1 kg)   Physical Exam  HENT:  Head: Normocephalic.  Right Ear: Tympanic membrane, external ear and ear canal normal.  Left Ear: Tympanic membrane, external ear and ear  canal normal.  Nose: Nose normal. No mucosal edema or rhinorrhea.  Mouth/Throat: Uvula is midline, oropharynx is clear and moist and mucous membranes are normal. No oropharyngeal exudate.  Eyes: Conjunctivae are normal.  Neck: Trachea normal. No tracheal tenderness present. No tracheal deviation present. No thyromegaly present.  Cardiovascular: Normal rate, regular rhythm, S1 normal, S2 normal and normal heart sounds.  No murmur heard. Pulmonary/Chest: Breath sounds normal. No stridor. No respiratory distress. She has no wheezes. She has no rales.  Musculoskeletal: She exhibits no edema.  Lymphadenopathy:  Head (right side): No tonsillar adenopathy present.       Head (left side): No tonsillar adenopathy present.    She has no cervical adenopathy.  Neurological: She is alert.  Skin: No rash noted. She is not diaphoretic. No erythema. Nails show no clubbing.    Diagnostics: none  Assessment and Plan:   1. Choking in adult   2. Dry eye syndrome of both eyes   3. Other allergic rhinitis   4. LPRD (laryngopharyngeal reflux disease)     1.  Continue bedtime use of OTC Systane gel eyedrops if needed  2.  Throughout the day can use OTC Systane eyedrops if needed  3.  Can use nasal ipratropium 1-2 sprays each nostril every 6-8 hours if needed  4.  Continue to Treat LPR:   A. Pantoprazole 40mg  - 1 tablet 2 times per day  B. Ranitidine 300mg  in evening  C. Consolidate caffeine  5.  Obtain Modified Barium Swallow for choking spells  6.  Return to clinic in 8 weeks or earlier if problem  Tywanna has had a very good response regarding LPR for the initial 4 weeks of therapy and I would like to see her back in this clinic after utilizing 12 weeks of this treatment and thus she will return in 8 weeks.  Her eye issue and her nose issue appears to be doing very well on her current therapy.  She does need further investigation for her choking episodes and we will obtain a modified barium  swallow.  I will contact her with the results of that test once it is available for review.  Allena Katz, MD Allergy / Immunology [place]

## 2017-11-14 NOTE — Assessment & Plan Note (Signed)
S:  controlled on simvastatin 40mg  Lab Results  Component Value Date   CHOL 125 08/07/2017   HDL 47.50 08/07/2017   LDLCALC 41 08/07/2017   LDLDIRECT 58.0 06/22/2016   TRIG 181.0 (H) 08/07/2017   CHOLHDL 3 08/07/2017   A/P: continue current rx with ldl goal under 70 due to arterial disease

## 2017-11-14 NOTE — Assessment & Plan Note (Signed)
Down 5 lbs. Encouraged her to continue healthy lifestyle efforts

## 2017-11-15 ENCOUNTER — Encounter: Payer: Self-pay | Admitting: Allergy and Immunology

## 2017-11-16 ENCOUNTER — Telehealth: Payer: Self-pay

## 2017-11-16 NOTE — Telephone Encounter (Signed)
Left a message to schedule an appt for "Modified Barium Swallow" @ [facility].

## 2017-11-17 ENCOUNTER — Ambulatory Visit (INDEPENDENT_AMBULATORY_CARE_PROVIDER_SITE_OTHER): Payer: Medicare Other | Admitting: General Practice

## 2017-11-17 DIAGNOSIS — Z7901 Long term (current) use of anticoagulants: Secondary | ICD-10-CM | POA: Diagnosis not present

## 2017-11-17 LAB — POCT INR: INR: 3.6 — AB (ref 2.0–3.0)

## 2017-11-17 NOTE — Patient Instructions (Addendum)
Pre visit review using our clinic review tool, if applicable. No additional management support is needed unless otherwise documented below in the visit note.  Hold coumadin today (7/26) and then continue to take 1 tablet daily except 1 1/2 tablets Monday and Wednesdays.  Re-check in 4 weeks.

## 2017-11-17 NOTE — Telephone Encounter (Signed)
[name] [name] returned the phone call and is going to call pt to schedule the appt for the "Modified Barium Swallow" testing.

## 2017-11-22 ENCOUNTER — Other Ambulatory Visit (HOSPITAL_COMMUNITY): Payer: Self-pay | Admitting: Allergy and Immunology

## 2017-11-22 DIAGNOSIS — R1310 Dysphagia, unspecified: Secondary | ICD-10-CM

## 2017-11-24 ENCOUNTER — Other Ambulatory Visit: Payer: Self-pay | Admitting: Family Medicine

## 2017-11-30 ENCOUNTER — Ambulatory Visit (HOSPITAL_COMMUNITY)
Admission: RE | Admit: 2017-11-30 | Discharge: 2017-11-30 | Disposition: A | Discharge status: Home / Self Care | Payer: Medicare Other | Source: Ambulatory Visit | Attending: Allergy and Immunology | Admitting: Allergy and Immunology

## 2017-11-30 DIAGNOSIS — I10 Essential (primary) hypertension: Secondary | ICD-10-CM | POA: Diagnosis not present

## 2017-11-30 DIAGNOSIS — R0989 Other specified symptoms and signs involving the circulatory and respiratory systems: Secondary | ICD-10-CM | POA: Diagnosis not present

## 2017-11-30 DIAGNOSIS — I739 Peripheral vascular disease, unspecified: Secondary | ICD-10-CM | POA: Diagnosis not present

## 2017-11-30 DIAGNOSIS — J449 Chronic obstructive pulmonary disease, unspecified: Secondary | ICD-10-CM | POA: Insufficient documentation

## 2017-11-30 DIAGNOSIS — M1990 Unspecified osteoarthritis, unspecified site: Secondary | ICD-10-CM | POA: Insufficient documentation

## 2017-11-30 DIAGNOSIS — R1310 Dysphagia, unspecified: Secondary | ICD-10-CM | POA: Diagnosis not present

## 2017-11-30 DIAGNOSIS — Z9049 Acquired absence of other specified parts of digestive tract: Secondary | ICD-10-CM | POA: Diagnosis not present

## 2017-11-30 DIAGNOSIS — Z86718 Personal history of other venous thrombosis and embolism: Secondary | ICD-10-CM | POA: Diagnosis not present

## 2017-12-04 ENCOUNTER — Telehealth: Payer: Self-pay | Admitting: *Deleted

## 2017-12-04 NOTE — Telephone Encounter (Signed)
[facility] unless she knows of a name of GI MD  from a friend or family member.

## 2017-12-04 NOTE — Telephone Encounter (Signed)
-----   Message from Jiles Prows, MD sent at 12/04/2017  2:37 PM EDT ----- Let us get her to see a GI doctor for reflux and esophageal dysmotility

## 2017-12-04 NOTE — Telephone Encounter (Signed)
Dr Neldon Mc is there anyone in particular you want patient to see?

## 2017-12-05 NOTE — Telephone Encounter (Signed)
Dee Please refer patient per Dr Neldon Mc

## 2017-12-05 NOTE — Telephone Encounter (Signed)
noted 

## 2017-12-05 NOTE — Telephone Encounter (Signed)
Referral has been placed to [facility].  Thanks

## 2017-12-13 ENCOUNTER — Encounter: Payer: Self-pay | Admitting: Gastroenterology

## 2017-12-15 ENCOUNTER — Ambulatory Visit (INDEPENDENT_AMBULATORY_CARE_PROVIDER_SITE_OTHER): Payer: Medicare Other | Admitting: General Practice

## 2017-12-15 DIAGNOSIS — Z7901 Long term (current) use of anticoagulants: Secondary | ICD-10-CM | POA: Diagnosis not present

## 2017-12-15 LAB — POCT INR: INR: 4.3 — AB (ref 2.0–3.0)

## 2017-12-15 NOTE — Patient Instructions (Addendum)
Pre visit review using our clinic review tool, if applicable. No additional management support is needed unless otherwise documented below in the visit note.  Hold coumadin today (8/23) and tomorrow (8/24) then take 1 tablet daily.  Re-check in 2 weeks.

## 2017-12-29 ENCOUNTER — Ambulatory Visit (INDEPENDENT_AMBULATORY_CARE_PROVIDER_SITE_OTHER): Payer: Medicare Other | Admitting: General Practice

## 2017-12-29 ENCOUNTER — Encounter: Payer: Medicare Other | Admitting: Physical Medicine & Rehabilitation

## 2017-12-29 DIAGNOSIS — Z7901 Long term (current) use of anticoagulants: Secondary | ICD-10-CM

## 2017-12-29 LAB — POCT INR: INR: 1.8 — AB (ref 2.0–3.0)

## 2017-12-29 NOTE — Patient Instructions (Signed)
Pre visit review using our clinic review tool, if applicable. No additional management support is needed unless otherwise documented below in the visit note.  Take 1 1/2 tablets today and then change dosage and take 1 tablet daily except 1 1/2 tablets on Fridays.   Re-check in 3 weeks.

## 2017-12-30 ENCOUNTER — Other Ambulatory Visit: Payer: Self-pay | Admitting: Family Medicine

## 2018-01-05 ENCOUNTER — Encounter: Payer: Medicare Other | Attending: Physical Medicine & Rehabilitation | Admitting: Physical Medicine & Rehabilitation

## 2018-01-05 ENCOUNTER — Encounter: Payer: Self-pay | Admitting: Physical Medicine & Rehabilitation

## 2018-01-05 ENCOUNTER — Other Ambulatory Visit: Payer: Self-pay

## 2018-01-05 ENCOUNTER — Telehealth (INDEPENDENT_AMBULATORY_CARE_PROVIDER_SITE_OTHER): Payer: Self-pay | Admitting: Orthopaedic Surgery

## 2018-01-05 VITALS — BP 121/70 | HR 71 | Ht 59.0 in | Wt 171.2 lb

## 2018-01-05 DIAGNOSIS — M79604 Pain in right leg: Secondary | ICD-10-CM | POA: Diagnosis not present

## 2018-01-05 DIAGNOSIS — M791 Myalgia, unspecified site: Secondary | ICD-10-CM | POA: Insufficient documentation

## 2018-01-05 DIAGNOSIS — M792 Neuralgia and neuritis, unspecified: Secondary | ICD-10-CM | POA: Diagnosis not present

## 2018-01-05 DIAGNOSIS — M25561 Pain in right knee: Secondary | ICD-10-CM | POA: Insufficient documentation

## 2018-01-05 DIAGNOSIS — Z87891 Personal history of nicotine dependence: Secondary | ICD-10-CM | POA: Diagnosis not present

## 2018-01-05 DIAGNOSIS — Z86718 Personal history of other venous thrombosis and embolism: Secondary | ICD-10-CM | POA: Diagnosis not present

## 2018-01-05 DIAGNOSIS — M533 Sacrococcygeal disorders, not elsewhere classified: Secondary | ICD-10-CM

## 2018-01-05 DIAGNOSIS — I779 Disorder of arteries and arterioles, unspecified: Secondary | ICD-10-CM

## 2018-01-05 DIAGNOSIS — R269 Unspecified abnormalities of gait and mobility: Secondary | ICD-10-CM | POA: Diagnosis not present

## 2018-01-05 DIAGNOSIS — G894 Chronic pain syndrome: Secondary | ICD-10-CM | POA: Diagnosis not present

## 2018-01-05 DIAGNOSIS — I10 Essential (primary) hypertension: Secondary | ICD-10-CM | POA: Insufficient documentation

## 2018-01-05 DIAGNOSIS — G8929 Other chronic pain: Secondary | ICD-10-CM

## 2018-01-05 DIAGNOSIS — E785 Hyperlipidemia, unspecified: Secondary | ICD-10-CM | POA: Insufficient documentation

## 2018-01-05 DIAGNOSIS — K449 Diaphragmatic hernia without obstruction or gangrene: Secondary | ICD-10-CM | POA: Insufficient documentation

## 2018-01-05 DIAGNOSIS — G479 Sleep disorder, unspecified: Secondary | ICD-10-CM | POA: Insufficient documentation

## 2018-01-05 DIAGNOSIS — M961 Postlaminectomy syndrome, not elsewhere classified: Secondary | ICD-10-CM | POA: Diagnosis not present

## 2018-01-05 DIAGNOSIS — G500 Trigeminal neuralgia: Secondary | ICD-10-CM | POA: Diagnosis not present

## 2018-01-05 DIAGNOSIS — K219 Gastro-esophageal reflux disease without esophagitis: Secondary | ICD-10-CM | POA: Insufficient documentation

## 2018-01-05 DIAGNOSIS — J449 Chronic obstructive pulmonary disease, unspecified: Secondary | ICD-10-CM | POA: Diagnosis not present

## 2018-01-05 DIAGNOSIS — M419 Scoliosis, unspecified: Secondary | ICD-10-CM | POA: Diagnosis not present

## 2018-01-05 DIAGNOSIS — M545 Low back pain: Secondary | ICD-10-CM | POA: Diagnosis not present

## 2018-01-05 DIAGNOSIS — M1711 Unilateral primary osteoarthritis, right knee: Secondary | ICD-10-CM | POA: Diagnosis not present

## 2018-01-05 DIAGNOSIS — M109 Gout, unspecified: Secondary | ICD-10-CM | POA: Insufficient documentation

## 2018-01-05 DIAGNOSIS — I739 Peripheral vascular disease, unspecified: Secondary | ICD-10-CM | POA: Diagnosis not present

## 2018-01-05 DIAGNOSIS — Z8371 Family history of colonic polyps: Secondary | ICD-10-CM | POA: Insufficient documentation

## 2018-01-05 MED ORDER — DULOXETINE HCL 60 MG PO CPEP: 60.0000 mg | ORAL_CAPSULE | Freq: Every day | ORAL | 5 refills | Status: DC

## 2018-01-05 MED ORDER — METHOCARBAMOL 500 MG PO TABS: ORAL_TABLET | ORAL | 5 refills | Status: DC

## 2018-01-05 NOTE — Telephone Encounter (Signed)
Patient called to state that she is requesting a gel injection.  Please call patient to advise [phone]

## 2018-01-05 NOTE — Progress Notes (Signed)
Subjective:    Patient ID: [name] [name], female    DOB: Jul 17, 1924, 84 y.o.   MRN: [id number]  HPI 84 y/o female with pmhpsh of PAD, COPD, DVT, HTN, Gout, trigeminal neuralgia, post herpetic neuralgia, OA of right knee with injections, lumbar fusion present for follow up of pain in her back > right leg pain.   Initially stated: Pt stated she has had back pain "all her life".  Getting progressively worse.  Movement improves the pain.  Standing and walking exacerbate the pain. Dull pain.  Non-radiating.  Intermittent.  Denies associated numbness, tingling, weakness.  Lidoderm patch help. Pain limits pt from doing things around the house.   Last clinic visit 09/28/17.  Since last visit, pt tried Lidoderm patch with benefit to her knee. Today, he complains of back pain.    Pain Inventory Average Pain 5 Pain Right Now 6 My pain is dull  In the last 24 hours, has pain interfered with the following? General activity 4 Relation with others 2 Enjoyment of life 4 What TIME of day is your pain at its worst? daytime and night,  Sleep (in general) Fair  Pain is worse with: walking, bending and standing Pain improves with: nothing Relief from Meds: 0  Mobility walk without assistance use a cane how many minutes can you walk? 2-3 ability to climb steps?  yes do you drive?  yes Do you have any goals in this area?  yes  Function retired I need assistance with the following:  household duties  Neuro/Psych bladder control problems trouble walking confusion  Prior Studies Any changes since last visit?  no  Physicians involved in your care Any changes since last visit?  no   Family History  Problem Relation Age of Onset  . Arthritis Mother   . Hypertension Mother   . Heart disease Father   . Colon polyps Father   . Breast cancer Sister   . Lung cancer Sister   . Colon cancer Sister   . Cancer Sister        Breast  . Cancer Daughter        Breast  . Hypertension Son   .  Lung cancer Brother   . Cancer Brother        Lung  . Anesthesia problems Neg Hx   . Hypotension Neg Hx   . Malignant hyperthermia Neg Hx   . Pseudochol deficiency Neg Hx    Social History   Socioeconomic History  . Marital status: Single    Spouse name: Not on file  . Number of children: 4  . Years of education: 53  . Highest education level: Not on file  Occupational History    Employer: RETIRED  Social Needs  . Financial resource strain: Not on file  . Food insecurity:    Worry: Not on file    Inability: Not on file  . Transportation needs:    Medical: Not on file    Non-medical: Not on file  Tobacco Use  . Smoking status: Former Smoker    Packs/day: 2.00    Years: 30.00    Pack years: 60.00    Types: Cigarettes    Last attempt to quit: 06/18/1993    Years since quitting: 24.5  . Smokeless tobacco: Never Used  . Tobacco comment: QUIT IN 1995  Substance and Sexual Activity  . Alcohol use: No    Alcohol/week: 0.0 standard drinks  . Drug use: No  . Sexual activity: Not  on file  Lifestyle  . Physical activity:    Days per week: Not on file    Minutes per session: Not on file  . Stress: Not on file  Relationships  . Social connections:    Talks on phone: Not on file    Gets together: Not on file    Attends religious service: Not on file    Active member of club or organization: Not on file    Attends meetings of clubs or organizations: Not on file    Relationship status: Not on file  Other Topics Concern  . Not on file  Social History Narrative   Patient is widowed with 4 children. 2 grandkids. Lives alone.    Patient is right handed.   Patient has high school education.   Patient drinks 1 cup daily.      Retired from [employer] for 28 years, works 2 days a week until 2016.       Hobbies: volunteers at [employer], [place] from church visits people.       Past Surgical History:  Procedure Laterality Date  . ABDOMINAL AORTAGRAM N/A  06/17/2011   Procedure: ABDOMINAL Maxcine Ham;  Surgeon: Elam Dutch, MD;  Location: [facility] CATH LAB;  Service: Cardiovascular;  Laterality: N/A;  . CHOLECYSTECTOMY  1998  . EYE SURGERY     cataracts removed- bilateral /w IOL  . FEMORAL-POPLITEAL BYPASS GRAFT        x2 surgeries 1990's & 2009  . INJECTION KNEE Right Aug. 2016   Gel injection for pain  . LUMBAR FUSION  07/06/2011  . TONSILLECTOMY     as a teenager   . TUBAL LIGATION     Past Medical History:  Diagnosis Date  . Allergic rhinoconjunctivitis   . Anxiety    pt. managed- uses deep breathing   . Arthritis    low back , stenosis  . COLONIC POLYPS, RECURRENT 08/29/2006   2008 last colonoscopy. No further colonoscopy.     Marland Kitchen COPD (chronic obstructive pulmonary disease) ([place])   . Diverticulosis   . DVT (deep venous thrombosis) ([place])   . Eczema   . Environmental allergies    allergy shot- q friday in Dr. Janee Morn office. PFT's abnormal- recommended Spiriva to use preop & will d/c after surgery  . GERD (gastroesophageal reflux disease)   . H/O hiatal hernia   . History of colonic polyps   . Hyperlipidemia   . Hypertension   . Lung nodule 2011  . Peripheral vascular disease ([place])   . Shingles   . Stenosis of popliteal artery (HCC)    blood clots in legs long ago    . Trigeminal neuralgia    BP 121/70   Pulse 71   Ht 4\' 11"  (1.499 m)   Wt 171 lb 3.2 oz (77.7 kg)   SpO2 98%   BMI 34.58 kg/m   Opioid Risk Score:   Fall Risk Score:  `1  Depression screen PHQ 2/9  Depression screen [facility] 2/9 01/05/2018 11/01/2017 02/22/2017 10/31/2016 06/16/2016 05/05/2016 09/14/2015  Decreased Interest 0 0 0 0 0 0 0  Down, Depressed, Hopeless 0 0 0 0 0 0 0  PHQ - 2 Score 0 0 0 0 0 0 0  Altered sleeping - 0 - - - 1 -  Tired, decreased energy - 0 - - - 0 -  Change in appetite - 0 - - - 1 -  Feeling bad or failure about yourself  - 0 - - -  0 -  Trouble concentrating - 0 - - - 0 -  Moving slowly or fidgety/restless - 0 - - - 0 -  Suicidal  thoughts - 0 - - - 0 -  PHQ-9 Score - 0 - - - 2 -  Difficult doing work/chores - Not difficult at all - - - - -  Some recent data might be hidden   Review of Systems  HENT: Negative.   Eyes: Negative.   Respiratory: Negative.   Cardiovascular: Negative.   Gastrointestinal: Negative.   Endocrine: Negative.        High blood pressure   Genitourinary: Negative.   Musculoskeletal: Positive for arthralgias, back pain and gait problem.  Skin: Negative.   Allergic/Immunologic: Negative.   Hematological: Negative.   Psychiatric/Behavioral: Negative.   All other systems reviewed and are negative.     Objective:   Physical Exam Gen: NAD. Vital signs reviewed HENT: Normocephalic, Atraumatic Eyes: EOMI. No discharge. Cardio: RRR. No JVD. Pulm: B/l clear to auscultation.  Effort normal.  Abd: Nondistended, BS+ MSK:  Gait antalgic.   +Mild TTP b/l lumbosacral PSPs Neuro:   Strength  5/5 grossly throughout Skin: Intact. Warm and dry.    Assessment & Plan:  84 y/o female with pmhpsh of PAD, COPD, DVT, HTN, Gout, trigeminal neuralgia, post herpetic neuralgia, OA of right knee with injections, lumbar fusion present for follow up of pain in her back > right leg pain.    1. Chronic low back pain with failed back syndrome   Xray of L-spine and right SI joint reviewed, SI joint unremarkable, degenerative changes in spine with right scoliosis.    Will avoid NSAIDs due to coumadin  Unable to tolerate Gabapentin  Cont heat  Cont tylenol, limit to 2000mg /day  Cont Lidoderm patches (occasionally uses)  Cont HEP, completed PT  Cont TENS unit PRN  Cont Cymbalta to 60 mg  Cont Robaxin 500 BID PRN  2. Possible Sacroiliitis on right  Xray unremakrable  Cont bracing PRN  3. Sleep disturbance  Significantly improved  Cont melatonin  4. Neuropathic pain  See #1  5. Gait abnormality  Improved  Cont cane   Cont HEP  6. Myalgia  Trigger point injections ineffective  7. Right knee  pain  MRI reviewed from 2016, showing meniscal/cartilage damage  Receiving injections from Ortho, cont  [place] denied  Cont voltaren gel  Cont lidoderm on knee  8. Right ankle pain - likely gout flare  Lidoderm patches, pt using Voltaren gel  Xrays reviewed, relatively uremarkable  Uric acid elevated  Improved at present

## 2018-01-05 NOTE — Telephone Encounter (Signed)
Ok that's fine

## 2018-01-05 NOTE — Telephone Encounter (Signed)
Please check benefits for another gel injection, ok per [name], thanks.

## 2018-01-05 NOTE — Telephone Encounter (Signed)
I did not call patient to advise we are proceeding, can you please call? Thanks.

## 2018-01-05 NOTE — Telephone Encounter (Signed)
Last gel inj given 03/03/17.  See message below.

## 2018-01-08 ENCOUNTER — Telehealth (INDEPENDENT_AMBULATORY_CARE_PROVIDER_SITE_OTHER): Payer: Self-pay

## 2018-01-08 NOTE — Telephone Encounter (Signed)
Submitted VOB for SynviscOne, right knee. 

## 2018-01-08 NOTE — Telephone Encounter (Signed)
Talked with patient and advised her that I will submit for SynviscOne, right knee and she will get a call once we have an approval to schedule an appointment with Dr. [name].

## 2018-01-09 ENCOUNTER — Ambulatory Visit: Payer: Medicare Other | Admitting: Allergy and Immunology

## 2018-01-16 ENCOUNTER — Encounter: Payer: Self-pay | Admitting: Allergy and Immunology

## 2018-01-16 ENCOUNTER — Telehealth (INDEPENDENT_AMBULATORY_CARE_PROVIDER_SITE_OTHER): Payer: Self-pay

## 2018-01-16 ENCOUNTER — Ambulatory Visit (INDEPENDENT_AMBULATORY_CARE_PROVIDER_SITE_OTHER): Payer: Medicare Other | Admitting: Allergy and Immunology

## 2018-01-16 VITALS — BP 122/86 | HR 72 | Resp 16

## 2018-01-16 DIAGNOSIS — K219 Gastro-esophageal reflux disease without esophagitis: Secondary | ICD-10-CM

## 2018-01-16 DIAGNOSIS — H04123 Dry eye syndrome of bilateral lacrimal glands: Secondary | ICD-10-CM

## 2018-01-16 DIAGNOSIS — I779 Disorder of arteries and arterioles, unspecified: Secondary | ICD-10-CM

## 2018-01-16 DIAGNOSIS — J3089 Other allergic rhinitis: Secondary | ICD-10-CM | POA: Diagnosis not present

## 2018-01-16 NOTE — Progress Notes (Signed)
Follow-up Note  Referring Provider: Marin Olp, MD Primary Provider: Marin Olp, MD Date of Office Visit: 01/16/2018  Subjective:   [name] [name] (DOB: [birth year]) is a 84 y.o. female who returns to the [facility] on 01/16/2018 in re-evaluation of the following:  HPI: [name] [name] returns to this clinic in evaluation of her LPR, chronic rhinitis, and dry eye syndrome.  Her last visit to this clinic was 14 November 2017.  She has continued to show significant improvement regarding all of her throat issues and choking episodes and recurrent sore throat while aggressively treating reflux.  Her barium swallow did not identify any significant abnormality as detailed below.  Her nose is okay while using nasal ipratropium but still occasionally has some rhinorrhea but certainly improved.  Her eyes had improved significantly while using Systane gel drops at bedtime and then regular Systane eyedrops throughout the day but she still has a lot of watery eyes that has redeveloped over the course of the past several weeks.  Allergies as of 01/16/2018      Reactions   Sulfa Antibiotics Other (See Comments)   Cold sweat light headed and disorientation   Tiotropium Bromide Shortness Of Breath, Other (See Comments)   Sore throat also   Tramadol Nausea And Vomiting   Latex Itching, Rash      Medication List      acetaminophen 500 MG tablet Commonly known as:  TYLENOL Take 500-1,500 mg by mouth 3 (three) times daily as needed for moderate pain. Reported on 08/31/2015   aspirin EC 81 MG tablet Take 81 mg by mouth daily.   calcium-vitamin D 500-200 MG-UNIT tablet Commonly known as:  OSCAL WITH D Take 1 tablet by mouth daily.   colchicine 0.6 MG tablet Take 2 pills at first sign gout flare, then another pill 2 hours later if still having pain. Then take once daily until pain resolves.   cromolyn 4 % ophthalmic solution Commonly known as:  OPTICROM INSTILL 1 DROP  INTO BOTH EYES 4 TIMES A DAY   diclofenac sodium 1 % Gel Commonly known as:  VOLTAREN Apply 2 g topically 4 (four) times daily.   DULoxetine 60 MG capsule Commonly known as:  CYMBALTA Take 1 capsule (60 mg total) by mouth daily.   hydrochlorothiazide 25 MG tablet Commonly known as:  HYDRODIURIL TAKE 1 TABLET BY MOUTH ONCE DAILY   ipratropium 0.06 % nasal spray Commonly known as:  ATROVENT Place 2 sprays into both nostrils 3 (three) times daily.   irbesartan 300 MG tablet Commonly known as:  AVAPRO TAKE 1 TABLET BY MOUTH ONCE DAILY   levocetirizine 5 MG tablet Commonly known as:  XYZAL Take 5 mg by mouth every evening.   methocarbamol 500 MG tablet Commonly known as:  ROBAXIN TAKE 1 TABLET BY MOUTH TWICE DAILY AS NEEDED FOR MUSCLE SPASM   pantoprazole 40 MG tablet Commonly known as:  PROTONIX Take 1 tablet (40 mg total) by mouth 2 (two) times daily.   PRILOSEC 20 MG capsule Generic drug:  omeprazole Take 20 mg by mouth every morning.   SHINGRIX injection Generic drug:  Zoster Vaccine Adjuvanted   simvastatin 40 MG tablet Commonly known as:  ZOCOR TAKE 1 TABLET BY MOUTH ONCE DAILY AT  6  PM   warfarin 5 MG tablet Commonly known as:  COUMADIN Take as directed by the anticoagulation clinic. If you are unsure how to take this medication, talk to your nurse or doctor.  Original instructions:  Take 1 tablet daily except 1 1/2 tablets on Fridays or AS DIRECTED BY ANTICOAGULATION CLINIC       Past Medical History:  Diagnosis Date  . Allergic rhinoconjunctivitis   . Anxiety    pt. managed- uses deep breathing   . Arthritis    low back , stenosis  . COLONIC POLYPS, RECURRENT 08/29/2006   2008 last colonoscopy. No further colonoscopy.     Marland Kitchen COPD (chronic obstructive pulmonary disease) ([place])   . Diverticulosis   . DVT (deep venous thrombosis) ([place])   . Eczema   . Environmental allergies    allergy shot- q friday in Dr. Janee Morn office. PFT's abnormal- recommended  Spiriva to use preop & will d/c after surgery  . GERD (gastroesophageal reflux disease)   . H/O hiatal hernia   . History of colonic polyps   . Hyperlipidemia   . Hypertension   . Lung nodule 2011  . Peripheral vascular disease ([place])   . Shingles   . Stenosis of popliteal artery (HCC)    blood clots in legs long ago    . Trigeminal neuralgia     Past Surgical History:  Procedure Laterality Date  . ABDOMINAL AORTAGRAM N/A 06/17/2011   Procedure: ABDOMINAL Maxcine Ham;  Surgeon: Elam Dutch, MD;  Location: [facility] CATH LAB;  Service: Cardiovascular;  Laterality: N/A;  . CHOLECYSTECTOMY  1998  . EYE SURGERY     cataracts removed- bilateral /w IOL  . FEMORAL-POPLITEAL BYPASS GRAFT        x2 surgeries 1990's & 2009  . INJECTION KNEE Right Aug. 2016   Gel injection for pain  . LUMBAR FUSION  07/06/2011  . TONSILLECTOMY     as a teenager   . TUBAL LIGATION      Review of systems negative except as noted in HPI / PMHx or noted below:  Review of Systems  Constitutional: Negative.   HENT: Negative.   Eyes: Negative.   Respiratory: Negative.   Cardiovascular: Negative.   Gastrointestinal: Negative.   Genitourinary: Negative.   Musculoskeletal: Negative.   Skin: Negative.   Neurological: Negative.   Endo/Heme/Allergies: Negative.   Psychiatric/Behavioral: Negative.      Objective:   Vitals:   01/16/18 0938  BP: 122/86  Pulse: 72  Resp: 16          Physical Exam  HENT:  Head: Normocephalic.  Right Ear: Tympanic membrane, external ear and ear canal normal.  Left Ear: Tympanic membrane, external ear and ear canal normal.  Nose: Nose normal. No mucosal edema or rhinorrhea.  Mouth/Throat: Uvula is midline, oropharynx is clear and moist and mucous membranes are normal. No oropharyngeal exudate.  Eyes: Conjunctivae are normal.  Neck: Trachea normal. No tracheal tenderness present. No tracheal deviation present. No thyromegaly present.  Cardiovascular: Normal rate,  regular rhythm, S1 normal, S2 normal and normal heart sounds.  No murmur heard. Pulmonary/Chest: Breath sounds normal. No stridor. No respiratory distress. She has no wheezes. She has no rales.  Musculoskeletal: She exhibits no edema.  Lymphadenopathy:       Head (right side): No tonsillar adenopathy present.       Head (left side): No tonsillar adenopathy present.    She has no cervical adenopathy.  Neurological: She is alert.  Skin: No rash noted. She is not diaphoretic. No erythema. Nails show no clubbing.    Diagnostics:    Results of a modified barium swallow obtained 30 November 2017 identified the following:  Pt presents  with functional oropharyngeal swallow ability.  Trace upper laryngeal penetration with thin observed which is [facility] for pt's age.  Her swallow is strong and timely without residuals.  Pt noted to breath hold prior to swallow initiation with nectar liquid.  Of note, pt swallowed barium tablet with ease but reported sensation of it lodging in her pharynx.  Suspect pt's symptoms are esophageal related.     Assessment and Plan:   1. Other allergic rhinitis   2. Dry eye syndrome of both eyes   3. LPRD (laryngopharyngeal reflux disease)     1.  Continue bedtime use of OTC Systane gel eyedrops if needed  2.  Throughout the day can use OTC Systane eyedrops if needed  3.  Can use nasal ipratropium 1-2 sprays each nostril every 6-8 hours if needed  4.  Continue to Treat LPR:   A. Pantoprazole 40mg  - 1 tablet 2 times per day  B. Ranitidine 300mg  in evening  C. Consolidate caffeine  5.  Ask ophthalmologist about using Restasis for at least 6 weeks  6.  Return to clinic in 12 weeks or earlier if problem. Taper?  7.  Obtain fall flu vaccine  Saadiya is certainly improved with therapy directed against LPR and she will continue to use this aggressive plan until I can see her back in this clinic in 12 weeks at which point in time there may be a opportunity to consolidate  some of her treatment.  She still is having problems with her dry eye syndrome and I have asked her to revisit with her ophthalmologist to address the issue of whether or not she be a candidate for Restasis.  I did tell her that she would have to use Restasis for at least 6 weeks to see if this resulted in improvement.  I will see her back in this clinic in 12 weeks or earlier if there is a problem.  Allena Katz, MD Allergy / Immunology [place]

## 2018-01-16 NOTE — Patient Instructions (Addendum)
  1.  Continue bedtime use of OTC Systane gel eyedrops if needed  2.  Throughout the day can use OTC Systane eyedrops if needed  3.  Can use nasal ipratropium 1-2 sprays each nostril every 6-8 hours if needed  4.  Continue to Treat LPR:   A. Pantoprazole 40mg  - 1 tablet 2 times per day  B. Ranitidine 300mg  in evening  C. Consolidate caffeine  5.  Ask ophthalmologist about using Restasis for at least 6 weeks  6.  Return to clinic in 12 weeks or earlier if problem. Taper?  7.  Obtain fall flu vaccine

## 2018-01-16 NOTE — Telephone Encounter (Signed)
Talked with patient and advised her that she is approved for SynviscOne, right knee. Buy & Bill Covered at 100% through insurance after Medicare pays. No Co-pay No PA required  Appt.scheduled 01/18/2018

## 2018-01-17 ENCOUNTER — Encounter: Payer: Self-pay | Admitting: Allergy and Immunology

## 2018-01-18 ENCOUNTER — Ambulatory Visit (INDEPENDENT_AMBULATORY_CARE_PROVIDER_SITE_OTHER): Payer: Medicare Other | Admitting: Orthopaedic Surgery

## 2018-01-18 DIAGNOSIS — I779 Disorder of arteries and arterioles, unspecified: Secondary | ICD-10-CM | POA: Diagnosis not present

## 2018-01-18 DIAGNOSIS — M76822 Posterior tibial tendinitis, left leg: Secondary | ICD-10-CM

## 2018-01-18 DIAGNOSIS — M1711 Unilateral primary osteoarthritis, right knee: Secondary | ICD-10-CM | POA: Diagnosis not present

## 2018-01-18 MED ORDER — HYLAN G-F 20 48 MG/6ML IX SOSY
48.0000 mg | PREFILLED_SYRINGE | INTRA_ARTICULAR | Status: AC | PRN
  Administered 2018-01-18: 48 mg via INTRA_ARTICULAR

## 2018-01-18 NOTE — Progress Notes (Signed)
Office Visit Note   Patient: [name] [name]           Date of Birth: [birth year]           MRN: [id number] Visit Date: 01/18/2018              Requested by: Marin Olp, MD [place], [place] [address] PCP: Marin Olp, MD   Assessment & Plan: Visit Diagnoses:  1. Unilateral primary osteoarthritis, right knee   2. Posterior tibial tendon dysfunction, left     Plan: Right knee was injected with Synvisc today.  In terms of her ankle I think she has posterior tibial tendinitis.  We discussed CAM Walker immobilization versus ASO brace versus relative rest and activity modification.  I did recommend trying Voltaren gel.  It does not seem to be bad enough to warrant an injection at this point.  Follow-up as needed.  Follow-Up Instructions: Return if symptoms worsen or fail to improve.   Orders:  No orders of the defined types were placed in this encounter.  No orders of the defined types were placed in this encounter.     Procedures: Large Joint Inj: R knee on 01/18/2018 9:13 AM Indications: pain Details: 22 G needle  Arthrogram: No  Medications: 48 mg Hylan 48 MG/6ML Outcome: tolerated well, no immediate complications Patient was prepped and draped in the usual sterile fashion.       Clinical Data: No additional findings.   Subjective: Chief Complaint  Patient presents with  . Right Knee - Pain    Ms. Rayborn comes in today for Synvisc injection of her right knee.  She is also complaining of right posterior medial ankle pain.  This comes and goes.  She does not have constant pain.  Denies any injuries.  Denies any swelling.   Review of Systems   Objective: Vital Signs: There were no vitals taken for this visit.  Physical Exam  Ortho Exam Right ankle exam shows tenderness along the posterior tibial tendon.  There is no obvious evidence of swelling. Specialty Comments:  No specialty comments available.  Imaging: No results  found.   PMFS History: Patient Active Problem List   Diagnosis Date Noted  . Obesity (BMI 30.0-34.9) 11/14/2017  . Failed back syndrome 09/28/2017  . CKD (chronic kidney disease), stage III ([place]) 08/07/2017  . Long term (current) use of anticoagulants 02/03/2017  . Unilateral primary osteoarthritis, right knee 04/07/2016  . Acute gout of left foot 03/22/2016  . Pain in right ankle and joints of right foot 08/28/2014  . Trigeminal neuralgia   . Encounter for therapeutic drug monitoring 10/21/2013  . Syncope 12/29/2011  . Chronic low back pain 09/13/2010  . Allergic rhinitis due to pollen 06/25/2010  . COPD mixed type ([place]) 01/22/2010  . Solitary pulmonary nodule 01/12/2010  . Insulin resistance 01/06/2009  . Osteopenia 01/06/2009  . Eczema 11/26/2007  . Peripheral arterial disease ([place]) 06/29/2007  . Hyperlipidemia 10/23/2006  . Essential hypertension 10/23/2006  . ESOPHAGEAL STRICTURE 08/09/1993   Past Medical History:  Diagnosis Date  . Allergic rhinoconjunctivitis   . Anxiety    pt. managed- uses deep breathing   . Arthritis    low back , stenosis  . COLONIC POLYPS, RECURRENT 08/29/2006   2008 last colonoscopy. No further colonoscopy.     Marland Kitchen COPD (chronic obstructive pulmonary disease) ([place])   . Diverticulosis   . DVT (deep venous thrombosis) ([place])   . Eczema   .  Environmental allergies    allergy shot- q friday in Dr. Janee Morn office. PFT's abnormal- recommended Spiriva to use preop & will d/c after surgery  . GERD (gastroesophageal reflux disease)   . H/O hiatal hernia   . History of colonic polyps   . Hyperlipidemia   . Hypertension   . Lung nodule 2011  . Peripheral vascular disease ([place])   . Shingles   . Stenosis of popliteal artery (HCC)    blood clots in legs long ago    . Trigeminal neuralgia     Family History  Problem Relation Age of Onset  . Arthritis Mother   . Hypertension Mother   . Heart disease Father   . Colon polyps Father   . Breast cancer  Sister   . Lung cancer Sister   . Colon cancer Sister   . Cancer Sister        Breast  . Cancer Daughter        Breast  . Hypertension Son   . Lung cancer Brother   . Cancer Brother        Lung  . Anesthesia problems Neg Hx   . Hypotension Neg Hx   . Malignant hyperthermia Neg Hx   . Pseudochol deficiency Neg Hx     Past Surgical History:  Procedure Laterality Date  . ABDOMINAL AORTAGRAM N/A 06/17/2011   Procedure: ABDOMINAL Maxcine Ham;  Surgeon: Elam Dutch, MD;  Location: [facility] CATH LAB;  Service: Cardiovascular;  Laterality: N/A;  . CHOLECYSTECTOMY  1998  . EYE SURGERY     cataracts removed- bilateral /w IOL  . FEMORAL-POPLITEAL BYPASS GRAFT        x2 surgeries 1990's & 2009  . INJECTION KNEE Right Aug. 2016   Gel injection for pain  . LUMBAR FUSION  07/06/2011  . TONSILLECTOMY     as a teenager   . TUBAL LIGATION     Social History   Occupational History    Employer: RETIRED  Tobacco Use  . Smoking status: Former Smoker    Packs/day: 2.00    Years: 30.00    Pack years: 60.00    Types: Cigarettes    Last attempt to quit: 06/18/1993    Years since quitting: 24.6  . Smokeless tobacco: Never Used  . Tobacco comment: QUIT IN 1995  Substance and Sexual Activity  . Alcohol use: No    Alcohol/week: 0.0 standard drinks  . Drug use: No  . Sexual activity: Not on file

## 2018-01-19 ENCOUNTER — Ambulatory Visit (INDEPENDENT_AMBULATORY_CARE_PROVIDER_SITE_OTHER): Payer: Medicare Other | Admitting: General Practice

## 2018-01-19 DIAGNOSIS — Z7901 Long term (current) use of anticoagulants: Secondary | ICD-10-CM | POA: Diagnosis not present

## 2018-01-19 LAB — POCT INR: INR: 2.2 (ref 2.0–3.0)

## 2018-01-19 NOTE — Patient Instructions (Signed)
Pre visit review using our clinic review tool, if applicable. No additional management support is needed unless otherwise documented below in the visit note.   Continue to take 1 tablet daily except 1 1/2 tablets on Fridays.   Re-check in 4 weeks.  

## 2018-01-19 NOTE — Progress Notes (Signed)
poct in

## 2018-02-01 ENCOUNTER — Encounter: Payer: Self-pay | Admitting: Gastroenterology

## 2018-02-01 ENCOUNTER — Encounter

## 2018-02-01 ENCOUNTER — Ambulatory Visit (INDEPENDENT_AMBULATORY_CARE_PROVIDER_SITE_OTHER): Payer: Medicare Other | Admitting: Gastroenterology

## 2018-02-01 VITALS — BP 142/68 | HR 68 | Ht 58.75 in | Wt 168.0 lb

## 2018-02-01 DIAGNOSIS — I779 Disorder of arteries and arterioles, unspecified: Secondary | ICD-10-CM | POA: Diagnosis not present

## 2018-02-01 DIAGNOSIS — R1319 Other dysphagia: Secondary | ICD-10-CM | POA: Diagnosis not present

## 2018-02-01 NOTE — Progress Notes (Signed)
[name] [name]    [id number]    11-Apr-1925  Primary Care Physician:Hunter, Brayton Mars, MD  Referring Physician: Jiles Prows, MD [address] [place], [place] [address]   Chief complaint: Dysphagia  HPI: 84 yr F here with complaints of choking sensation and coughing when she starts swallowing, usually happens with the first bite and after that she is fine.  Denies any issue with liquids.  She had modified barium swallow which did not show any aspiration She has been treated for acid reflux with no improvement of symptoms.  She has seasonal allergies and is followed by asthma and allergy clinic Denies any trouble swallowing once she is past the first bite.  She never regurgitates the food back.  No abdominal pain, nausea, vomiting, change in bowel habits, black stool or blood per rectum.  Colonoscopy 08/29/2006 multiple small dimunitive polyps, colonic diverticulosis.     Outpatient Encounter Medications as of 02/01/2018  Medication Sig  . acetaminophen (TYLENOL) 500 MG tablet Take 500-1,500 mg by mouth 3 (three) times daily as needed for moderate pain. Reported on 08/31/2015  . aspirin EC 81 MG tablet Take 81 mg by mouth daily.  . calcium-vitamin D (OSCAL WITH D 500-200) 500-200 MG-UNIT per tablet Take 1 tablet by mouth daily.    . colchicine (COLCRYS) 0.6 MG tablet Take 2 pills at first sign gout flare, then another pill 2 hours later if still having pain. Then take once daily until pain resolves.  . cromolyn (OPTICROM) 4 % ophthalmic solution INSTILL 1 DROP INTO BOTH EYES 4 TIMES A DAY  . diclofenac sodium (VOLTAREN) 1 % GEL Apply 2 g topically 4 (four) times daily.  . DULoxetine (CYMBALTA) 60 MG capsule Take 1 capsule (60 mg total) by mouth daily.  . hydrochlorothiazide (HYDRODIURIL) 25 MG tablet TAKE 1 TABLET BY MOUTH ONCE DAILY  . ipratropium (ATROVENT) 0.06 % nasal spray Place 2 sprays into both nostrils 3 (three) times daily.  . irbesartan (AVAPRO) 300 MG  tablet TAKE 1 TABLET BY MOUTH ONCE DAILY  . levocetirizine (XYZAL) 5 MG tablet Take 5 mg by mouth every evening.  . methocarbamol (ROBAXIN) 500 MG tablet TAKE 1 TABLET BY MOUTH TWICE DAILY AS NEEDED FOR MUSCLE SPASM  . omeprazole (PRILOSEC) 20 MG capsule Take 20 mg by mouth every morning.   . pantoprazole (PROTONIX) 40 MG tablet Take 1 tablet (40 mg total) by mouth 2 (two) times daily.  . simvastatin (ZOCOR) 40 MG tablet TAKE 1 TABLET BY MOUTH ONCE DAILY AT  6  PM  . warfarin (COUMADIN) 5 MG tablet Take 1 tablet daily except 1 1/2 tablets on Fridays or AS DIRECTED BY ANTICOAGULATION CLINIC  . [DISCONTINUED] SHINGRIX injection    No facility-administered encounter medications on file as of 02/01/2018.     Allergies as of 02/01/2018 - Review Complete 02/01/2018  Allergen Reaction Noted  . Sulfa antibiotics Other (See Comments) 12/05/2011  . Tiotropium bromide Shortness Of Breath and Other (See Comments) 06/21/2011  . Ultram [tramadol] Nausea And Vomiting 03/06/2015  . Latex Itching and Rash     Past Medical History:  Diagnosis Date  . Allergic rhinoconjunctivitis   . Anxiety    pt. managed- uses deep breathing   . Arthritis    low back , stenosis  . COLONIC POLYPS, RECURRENT 08/29/2006   2008 last colonoscopy. No further colonoscopy.     Marland Kitchen COPD (chronic obstructive pulmonary disease) ([place])   .  Diverticulosis   . DVT (deep venous thrombosis) ([place])   . Eczema   . Environmental allergies    allergy shot- q friday in Dr. Janee Morn office. PFT's abnormal- recommended Spiriva to use preop & will d/c after surgery  . GERD (gastroesophageal reflux disease)   . Hiatal hernia   . Hyperlipidemia   . Hypertension   . Lung nodule 2011  . Peripheral vascular disease ([place])   . Shingles   . Stenosis of popliteal artery (HCC)    blood clots in legs long ago    . Trigeminal neuralgia     Past Surgical History:  Procedure Laterality Date  . ABDOMINAL AORTAGRAM N/A 06/17/2011   Procedure:  ABDOMINAL Maxcine Ham;  Surgeon: Elam Dutch, MD;  Location: [facility] CATH LAB;  Service: Cardiovascular;  Laterality: N/A;  . CATARACT EXTRACTION, BILATERAL     w IOL  . CHOLECYSTECTOMY  1998  . FEMORAL-POPLITEAL BYPASS GRAFT        x2 surgeries 1990's & 2009  . INJECTION KNEE Right Aug. 2016   Gel injection for pain  . LUMBAR FUSION  07/06/2011  . TONSILLECTOMY     as a teenager   . TUBAL LIGATION      Family History  Problem Relation Age of Onset  . Arthritis Mother   . Hypertension Mother   . Heart disease Father   . Colon polyps Father   . Breast cancer Sister   . Breast cancer Daughter   . Hypertension Son   . Lung cancer Brother   . Colon cancer Sister   . Brain cancer Sister   . Lung cancer Sister   . Anesthesia problems Neg Hx   . Hypotension Neg Hx   . Malignant hyperthermia Neg Hx   . Pseudochol deficiency Neg Hx     Social History   Socioeconomic History  . Marital status: Single    Spouse name: Not on file  . Number of children: 4  . Years of education: 56  . Highest education level: Not on file  Occupational History  . Occupation: retired    Fish farm manager: RETIRED  Social Needs  . Financial resource strain: Not on file  . Food insecurity:    Worry: Not on file    Inability: Not on file  . Transportation needs:    Medical: Not on file    Non-medical: Not on file  Tobacco Use  . Smoking status: Former Smoker    Packs/day: 2.00    Years: 30.00    Pack years: 60.00    Types: Cigarettes    Last attempt to quit: 06/18/1993    Years since quitting: 24.6  . Smokeless tobacco: Never Used  . Tobacco comment: QUIT IN 1995  Substance and Sexual Activity  . Alcohol use: No    Alcohol/week: 0.0 standard drinks  . Drug use: No  . Sexual activity: Not on file  Lifestyle  . Physical activity:    Days per week: Not on file    Minutes per session: Not on file  . Stress: Not on file  Relationships  . Social connections:    Talks on phone: Not on file    Gets  together: Not on file    Attends religious service: Not on file    Active member of club or organization: Not on file    Attends meetings of clubs or organizations: Not on file    Relationship status: Not on file  . Intimate partner violence:  Fear of current or ex partner: Not on file    Emotionally abused: Not on file    Physically abused: Not on file    Forced sexual activity: Not on file  Other Topics Concern  . Not on file  Social History Narrative   Patient is widowed with 4 children. 2 grandkids. Lives alone.    Patient is right handed.   Patient has high school education.   Patient drinks 1 cup daily.      Retired from [employer] for 28 years, works 2 days a week until 2016.       Hobbies: volunteers at [employer], [place] from church visits people.          Review of systems: Review of Systems  Constitutional: Negative for fever and chills.  HENT: Positive for sinus problem Eyes: Negative for blurred vision.  Respiratory: Negative for cough, shortness of breath and wheezing.   Cardiovascular: Negative for chest pain and palpitations.  Gastrointestinal: as per HPI Genitourinary: Negative for dysuria, urgency, frequency and hematuria.  Musculoskeletal: Positive for myalgias, back pain and joint pain.  Skin: Negative for itching and rash.  Neurological: Negative for dizziness, tremors, focal weakness, seizures and loss of consciousness.  Endo/Heme/Allergies: Positive for seasonal allergies.  Psychiatric/Behavioral: Negative for depression, suicidal ideas and hallucinations.  All other systems reviewed and are negative.   Physical Exam: Vitals:   02/01/18 1324  BP: (!) 142/68  Pulse: 68   Body mass index is 34.22 kg/m. Gen:      No acute distress HEENT:  EOMI, sclera anicteric Neck:     No masses; no thyromegaly Lungs:    Clear to auscultation bilaterally; normal respiratory effort CV:         Regular rate and rhythm; no murmurs Abd:       + bowel sounds; soft, non-tender; no palpable masses, no distension Ext:    No edema; adequate peripheral perfusion Skin:      Warm and dry; no rash Neuro: alert and oriented x 3 Psych: normal mood and affect  Data Reviewed:  Reviewed labs, radiology imaging, old records and pertinent past GI work up   Assessment and Plan/Recommendations:  84 year old female here with complaints of choking sensation and coughing when she tries to initiate this swallow Modified barium swallow did not show any gross abnormality Brought based on history patient appears to have trouble initiation of swallow but once she starts eating and things go down well she does not have any difficulty mid meal. Will obtain full barium esophagram to exclude any esophageal stricture or abnormality Advised patient to swallow with chin tuck, small bites and take sips of water with a straw   Greater than 50% of the time used for counseling as well as treatment plan and follow-up. She had multiple questions which were answered to her satisfaction  K. Denzil Magnuson , MD [phone]    CC: Neldon Mc Donnamarie Poag, MD

## 2018-02-01 NOTE — Patient Instructions (Signed)
You have been scheduled for a Barium Esophogram at [facility] (1st floor of the hospital) on 02/23/2018 at 11:30 am. Please arrive 15 minutes prior to your appointment for registration. Make certain not to have anything to eat or drink 3 hours prior to your test. If you need to reschedule for any reason, please contact radiology at [phone] to do so. __________________________________________________________________ A barium swallow is an examination that concentrates on views of the esophagus. This tends to be a double contrast exam (barium and two liquids which, when combined, create a gas to distend the wall of the oesophagus) or single contrast (non-ionic iodine based). The study is usually tailored to your symptoms so a good history is essential. Attention is paid during the study to the form, structure and configuration of the esophagus, looking for functional disorders (such as aspiration, dysphagia, achalasia, motility and reflux) EXAMINATION You may be asked to change into a gown, depending on the type of swallow being performed. A radiologist and radiographer will perform the procedure. The radiologist will advise you of the type of contrast selected for your procedure and direct you during the exam. You will be asked to stand, sit or lie in several different positions and to hold a small amount of fluid in your mouth before being asked to swallow while the imaging is performed .In some instances you may be asked to swallow barium coated marshmallows to assess the motility of a solid food bolus. The exam can be recorded as a digital or video fluoroscopy procedure. POST PROCEDURE It will take 1-2 days for the barium to pass through your system. To facilitate this, it is important, unless otherwise directed, to increase your fluids for the next 24-48hrs and to resume your normal diet.  This test typically takes about 30 minutes to perform.   If you are age 9 or older, your body  mass index should be between 23-30. Your Body mass index is 34.22 kg/m. If this is out of the aforementioned range listed, please consider follow up with your Primary Care Provider.  If you are age 29 or younger, your body mass index should be between 19-25. Your Body mass index is 34.22 kg/m. If this is out of the aformentioned range listed, please consider follow up with your Primary Care Provider.    Thank you for choosing [facility]  Kavitha Nandigam,MD __________________________________________________________________________________

## 2018-02-08 ENCOUNTER — Telehealth: Payer: Self-pay

## 2018-02-08 NOTE — Telephone Encounter (Signed)
Patient is on ranitidine and would like to know what else can she take now.  Please Advise.

## 2018-02-08 NOTE — Telephone Encounter (Signed)
Called and spoke with the patient. Since [name] [name] takes 300mg  of Ranitidine OTC at night, have advised that she takes 40mg  of Pepcid OTC at night to replace Ranitidine. Patient verbalized understanding and will switch medications to continue to efficiently treat her condition.

## 2018-02-16 ENCOUNTER — Ambulatory Visit (INDEPENDENT_AMBULATORY_CARE_PROVIDER_SITE_OTHER): Payer: Medicare Other | Admitting: General Practice

## 2018-02-16 DIAGNOSIS — Z7901 Long term (current) use of anticoagulants: Secondary | ICD-10-CM | POA: Diagnosis not present

## 2018-02-16 LAB — POCT INR: INR: 1.7 — AB (ref 2.0–3.0)

## 2018-02-16 NOTE — Patient Instructions (Signed)
Pre visit review using our clinic review tool, if applicable. No additional management support is needed unless otherwise documented below in the visit note.  Take 2 tablets today (10/25) and then continue to take 1 tablet daily except 1 1/2 tablets on Fridays.   Re-check in 4 weeks.

## 2018-02-16 NOTE — Progress Notes (Signed)
Agree with management.  Sharena Dibenedetto J Rhys Anchondo, MD  

## 2018-02-23 ENCOUNTER — Ambulatory Visit (HOSPITAL_COMMUNITY): Payer: Medicare Other

## 2018-03-03 IMAGING — DX DG LUMBAR SPINE COMPLETE 4+V
5 series · 5 of 5 positions shown · non-contrast
Comparison: CT 08/29/2014

CLINICAL DATA: Chronic low back pain.

EXAM:
LUMBAR SPINE - COMPLETE 4+ VIEW

[l-spine ap]
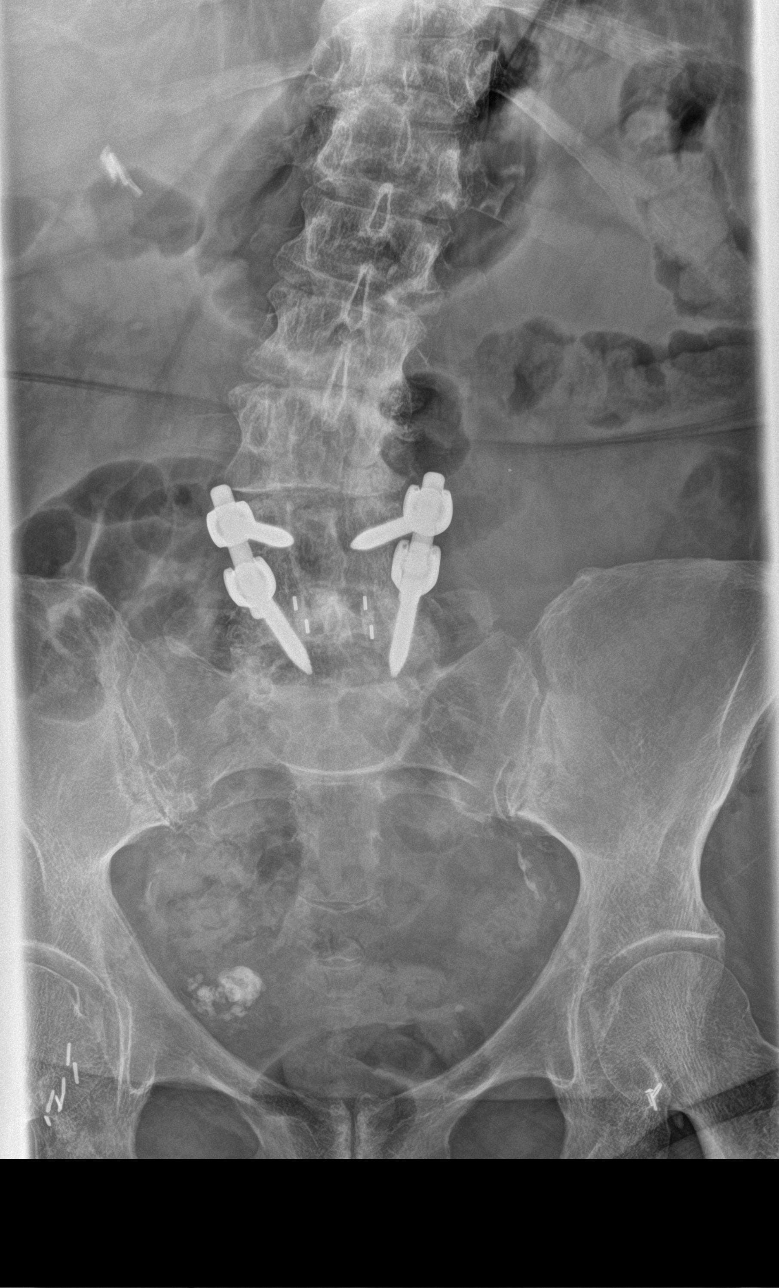

[l-spine obl (1 of 2)]
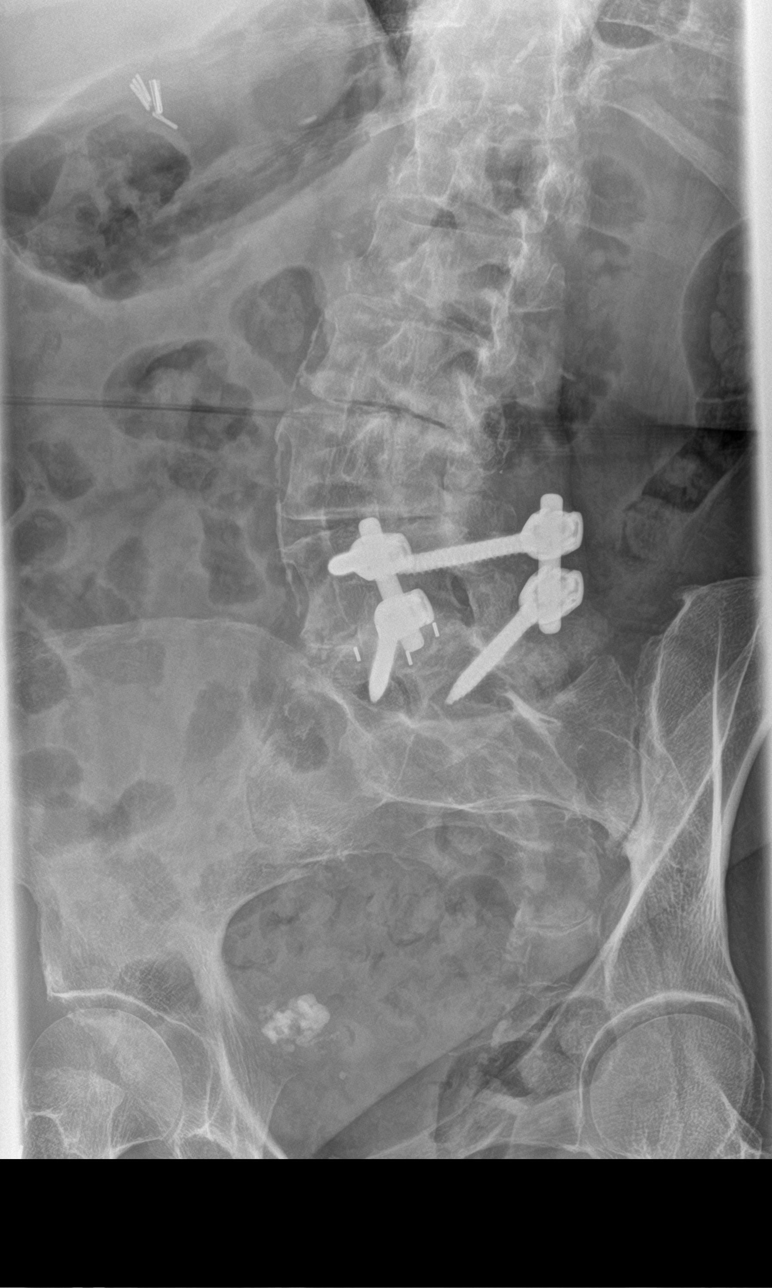

[l-spine obl (2 of 2)]
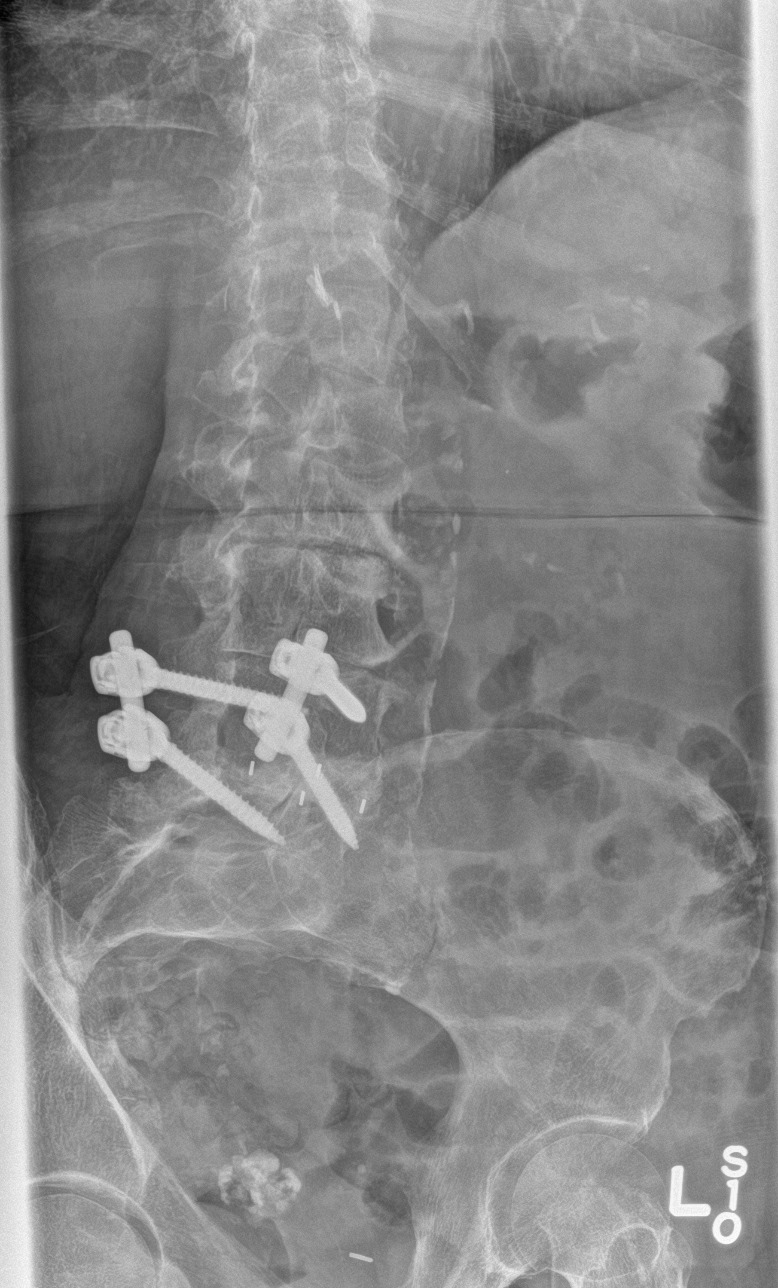

[l-spine lat]
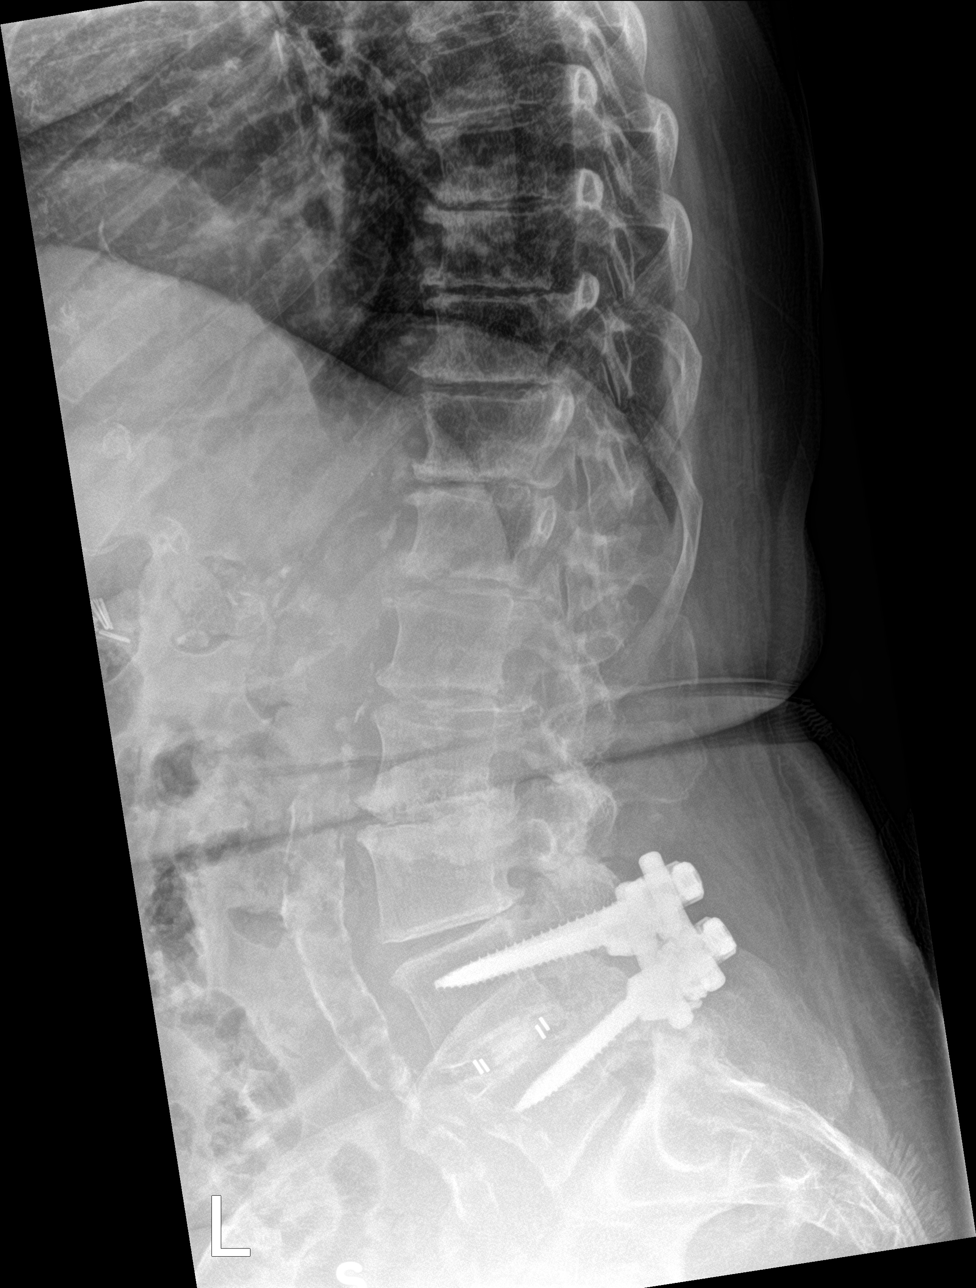

[l-spine spot]
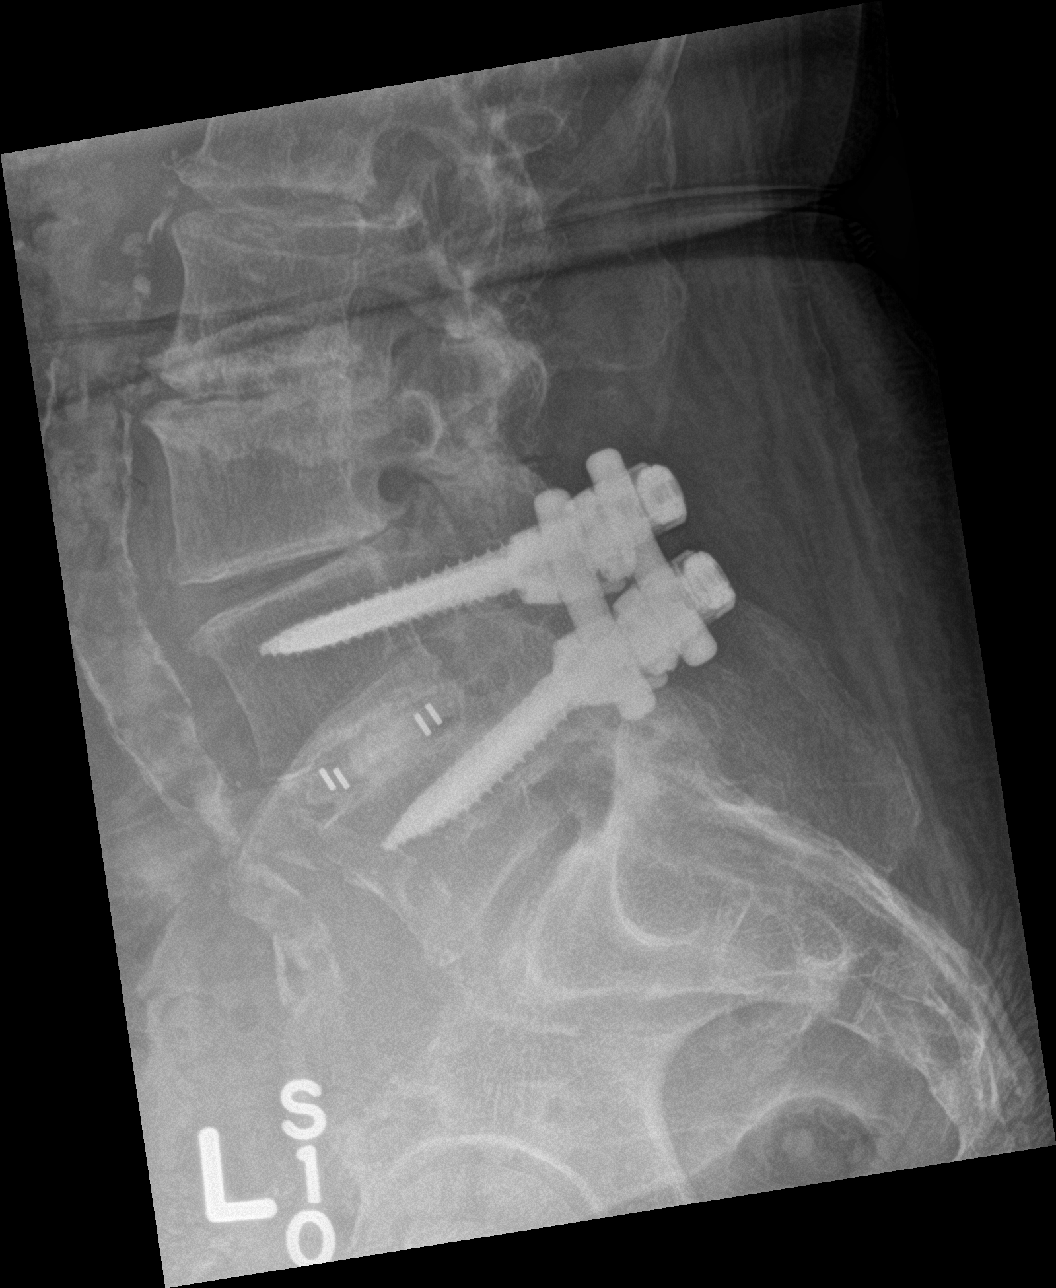

[5 of 5 positions shown; findings below may reference images not displayed]

FINDINGS: Mild rightward scoliosis. Postoperative changes from posterior
fusion at L4-5. Diffuse degenerative disc and facet disease
throughout the lumbar spine. No fracture. SI joints are symmetric
and unremarkable. Aortic and iliac calcifications. No aneurysm.
IMPRESSION: Diffuse degenerative disc and facet disease. Rightward scoliosis.
Postoperative changes. No acute findings.

## 2018-03-09 ENCOUNTER — Ambulatory Visit (HOSPITAL_COMMUNITY)
Admission: RE | Admit: 2018-03-09 | Discharge: 2018-03-09 | Disposition: A | Discharge status: Home / Self Care | Payer: Medicare Other | Source: Ambulatory Visit | Attending: Gastroenterology | Admitting: Gastroenterology

## 2018-03-09 DIAGNOSIS — R1310 Dysphagia, unspecified: Secondary | ICD-10-CM | POA: Insufficient documentation

## 2018-03-09 DIAGNOSIS — K228 Other specified diseases of esophagus: Secondary | ICD-10-CM | POA: Diagnosis not present

## 2018-03-09 DIAGNOSIS — R1319 Other dysphagia: Secondary | ICD-10-CM

## 2018-03-16 ENCOUNTER — Ambulatory Visit (INDEPENDENT_AMBULATORY_CARE_PROVIDER_SITE_OTHER): Payer: Medicare Other | Admitting: General Practice

## 2018-03-16 DIAGNOSIS — Z7901 Long term (current) use of anticoagulants: Secondary | ICD-10-CM

## 2018-03-16 LAB — POCT INR: INR: 2.6 (ref 2.0–3.0)

## 2018-03-16 NOTE — Patient Instructions (Addendum)
Pre visit review using our clinic review tool, if applicable. No additional management support is needed unless otherwise documented below in the visit note.   Continue to take 1 tablet daily except 1 1/2 tablets on Fridays.   Re-check in 4 weeks.

## 2018-03-31 ENCOUNTER — Other Ambulatory Visit: Payer: Self-pay | Admitting: Allergy and Immunology

## 2018-04-10 ENCOUNTER — Ambulatory Visit: Payer: Medicare Other | Admitting: Allergy and Immunology

## 2018-04-13 ENCOUNTER — Ambulatory Visit (INDEPENDENT_AMBULATORY_CARE_PROVIDER_SITE_OTHER): Payer: Medicare Other | Admitting: General Practice

## 2018-04-13 DIAGNOSIS — Z7901 Long term (current) use of anticoagulants: Secondary | ICD-10-CM | POA: Diagnosis not present

## 2018-04-13 LAB — POCT INR: INR: 2.5 (ref 2.0–3.0)

## 2018-04-13 NOTE — Patient Instructions (Addendum)
Pre visit review using our clinic review tool, if applicable. No additional management support is needed unless otherwise documented below in the visit note.  Continue to take 1 tablet daily except 1 1/2 tablets on Fridays.   Re-check in 4 weeks.

## 2018-04-23 ENCOUNTER — Other Ambulatory Visit: Payer: Self-pay | Admitting: Allergy and Immunology

## 2018-04-23 ENCOUNTER — Other Ambulatory Visit: Payer: Self-pay | Admitting: Family Medicine

## 2018-04-24 ENCOUNTER — Encounter: Payer: Self-pay | Admitting: Allergy and Immunology

## 2018-04-24 ENCOUNTER — Ambulatory Visit (INDEPENDENT_AMBULATORY_CARE_PROVIDER_SITE_OTHER): Payer: Medicare Other | Admitting: Allergy and Immunology

## 2018-04-24 VITALS — BP 132/78 | HR 67

## 2018-04-24 DIAGNOSIS — J3089 Other allergic rhinitis: Secondary | ICD-10-CM | POA: Diagnosis not present

## 2018-04-24 DIAGNOSIS — I779 Disorder of arteries and arterioles, unspecified: Secondary | ICD-10-CM

## 2018-04-24 DIAGNOSIS — K219 Gastro-esophageal reflux disease without esophagitis: Secondary | ICD-10-CM | POA: Diagnosis not present

## 2018-04-24 DIAGNOSIS — H04123 Dry eye syndrome of bilateral lacrimal glands: Secondary | ICD-10-CM

## 2018-04-24 NOTE — Patient Instructions (Addendum)
  1.  Continue Systane products, Restasis   2.  Can use nasal ipratropium 1-2 sprays each nostril every 6-8 hours if needed  3.  Continue to Treat LPR:   A.  Pantoprazole 40mg  - 1 tablet 2 times per day  B.  Can attempt to discontinue famotidine  4. Return to clinic in 6 months or earlier if problem.

## 2018-04-24 NOTE — Progress Notes (Signed)
Follow-up Note  Referring Provider: Marin Olp, MD Primary Provider: Marin Olp, MD Date of Office Visit: 04/24/2018  Subjective:   [name] [name] (DOB: 1925/03/30) is a 84 y.o. female who returns to the [facility] [place] on 04/24/2018 in re-evaluation of the following:  HPI: [name] [name] returns to this clinic in evaluation of LPR and dry eye syndrome and chronic rhinitis.  Her last visit to this clinic was 16 January 2018.  At that point in time we recommended that she go visit with her ophthalmologist to start Restasis.  Overall she has done very well with all of her issues.  She has very little problems with her nose while using nasal ipratropium.  Her eyes have improved significantly since starting Restasis and continuing on Systane.  Her entire throat issue is basically gone.  She does not have any choking episodes or recurrent sore throats.  She does not consume any caffeine.  She continues to use pantoprazole and famotidine.  Allergies as of 04/24/2018      Reactions   Sulfa Antibiotics Other (See Comments)   Cold sweat light headed and disorientation   Tiotropium Bromide Shortness Of Breath, Other (See Comments)   Sore throat also   Ultram [tramadol] Nausea And Vomiting   Latex Itching, Rash      Medication List      acetaminophen 500 MG tablet Commonly known as:  TYLENOL Take 500-1,500 mg by mouth 3 (three) times daily as needed for moderate pain. Reported on 08/31/2015   aspirin EC 81 MG tablet Take 81 mg by mouth daily.   calcium-vitamin D 500-200 MG-UNIT tablet Commonly known as:  OSCAL WITH D Take 1 tablet by mouth daily.   colchicine 0.6 MG tablet Commonly known as:  COLCRYS Take 2 pills at first sign gout flare, then another pill 2 hours later if still having pain. Then take once daily until pain resolves.   cromolyn 4 % ophthalmic solution Commonly known as:  OPTICROM INSTILL 1 DROP INTO BOTH EYES 4 TIMES A DAY   diclofenac  sodium 1 % Gel Commonly known as:  VOLTAREN Apply 2 g topically 4 (four) times daily.   DULoxetine 60 MG capsule Commonly known as:  CYMBALTA Take 1 capsule (60 mg total) by mouth daily.   famotidine 10 MG tablet Commonly known as:  PEPCID Take 10 mg by mouth daily.   hydrochlorothiazide 25 MG tablet Commonly known as:  HYDRODIURIL TAKE 1 TABLET BY MOUTH ONCE DAILY   ipratropium 0.06 % nasal spray Commonly known as:  ATROVENT INHALE 2 SPRAYS INTO BOTH NOSTRILS THREE TIMES A DAY   irbesartan 300 MG tablet Commonly known as:  AVAPRO TAKE 1 TABLET BY MOUTH ONCE DAILY   levocetirizine 5 MG tablet Commonly known as:  XYZAL Take 5 mg by mouth every evening.   methocarbamol 500 MG tablet Commonly known as:  ROBAXIN TAKE 1 TABLET BY MOUTH TWICE DAILY AS NEEDED FOR MUSCLE SPASM   pantoprazole 40 MG tablet Commonly known as:  PROTONIX TAKE 1 TABLET BY MOUTH TWICE DAILY   PRILOSEC 20 MG capsule Generic drug:  omeprazole Take 20 mg by mouth every morning.   RESTASIS 0.05 % ophthalmic emulsion Generic drug:  cycloSPORINE INSTILL 1 DROP INTO EACH EYE TWICE DAILY   simvastatin 40 MG tablet Commonly known as:  ZOCOR TAKE 1 TABLET BY MOUTH ONCE DAILY AT 6PM   warfarin 5 MG tablet Commonly known as:  COUMADIN Take as directed by  the anticoagulation clinic. If you are unsure how to take this medication, talk to your nurse or doctor. Original instructions:  Take 1 tablet daily except 1 1/2 tablets on Fridays or AS DIRECTED BY ANTICOAGULATION CLINIC       Past Medical History:  Diagnosis Date  . Allergic rhinoconjunctivitis   . Anxiety    pt. managed- uses deep breathing   . Arthritis    low back , stenosis  . COLONIC POLYPS, RECURRENT 08/29/2006   2008 last colonoscopy. No further colonoscopy.     Marland Kitchen COPD (chronic obstructive pulmonary disease) ([place])   . Diverticulosis   . DVT (deep venous thrombosis) ([place])   . Eczema   . Environmental allergies    allergy shot- q  friday in Dr. Janee Morn office. PFT's abnormal- recommended Spiriva to use preop & will d/c after surgery  . GERD (gastroesophageal reflux disease)   . Hiatal hernia   . Hyperlipidemia   . Hypertension   . Lung nodule 2011  . Peripheral vascular disease ([place])   . Shingles   . Stenosis of popliteal artery (HCC)    blood clots in legs long ago    . Trigeminal neuralgia     Past Surgical History:  Procedure Laterality Date  . ABDOMINAL AORTAGRAM N/A 06/17/2011   Procedure: ABDOMINAL Maxcine Ham;  Surgeon: Elam Dutch, MD;  Location: [facility] CATH LAB;  Service: Cardiovascular;  Laterality: N/A;  . CATARACT EXTRACTION, BILATERAL     w IOL  . CHOLECYSTECTOMY  1998  . FEMORAL-POPLITEAL BYPASS GRAFT        x2 surgeries 1990's & 2009  . INJECTION KNEE Right Aug. 2016   Gel injection for pain  . LUMBAR FUSION  07/06/2011  . TONSILLECTOMY     as a teenager   . TUBAL LIGATION      Review of systems negative except as noted in HPI / PMHx or noted below:  Review of Systems  Constitutional: Negative.   HENT: Negative.   Eyes: Negative.   Respiratory: Negative.   Cardiovascular: Negative.   Gastrointestinal: Negative.   Genitourinary: Negative.   Musculoskeletal: Negative.   Skin: Negative.   Neurological: Negative.   Endo/Heme/Allergies: Negative.   Psychiatric/Behavioral: Negative.      Objective:   Vitals:   04/24/18 1026  BP: 132/78  Pulse: 67  SpO2: 96%          Physical Exam Constitutional:      Appearance: She is not diaphoretic.  HENT:     Head: Normocephalic.     Right Ear: Tympanic membrane, ear canal and external ear normal.     Left Ear: Tympanic membrane, ear canal and external ear normal.     Nose: Nose normal. No mucosal edema or rhinorrhea.     Mouth/Throat:     Pharynx: Uvula midline. No oropharyngeal exudate.  Eyes:     Conjunctiva/sclera: Conjunctivae normal.  Neck:     Thyroid: No thyromegaly.     Trachea: Trachea normal. No tracheal tenderness  or tracheal deviation.  Cardiovascular:     Rate and Rhythm: Normal rate and regular rhythm.     Heart sounds: Normal heart sounds, S1 normal and S2 normal. No murmur.  Pulmonary:     Effort: No respiratory distress.     Breath sounds: Normal breath sounds. No stridor. No wheezing or rales.  Lymphadenopathy:     Head:     Right side of head: No tonsillar adenopathy.     Left side of head:  No tonsillar adenopathy.     Cervical: No cervical adenopathy.  Skin:    Findings: No erythema or rash.     Nails: There is no clubbing.   Neurological:     Mental Status: She is alert.     Diagnostics: none  Assessment and Plan:   1. Other allergic rhinitis   2. Dry eye syndrome of both eyes   3. LPRD (laryngopharyngeal reflux disease)     1.  Continue Systane products, Restasis   2.  Can use nasal ipratropium 1-2 sprays each nostril every 6-8 hours if needed  3.  Continue to Treat LPR:   A.  Pantoprazole 40mg  - 1 tablet 2 times per day  B.  Can attempt to discontinue famotidine  4. Return to clinic in 6 months or earlier if problem.    [name] [name] is doing very well at this point in time and she will remain 0n therapy directed against chronic rhinitis and reflux as noted above.  She has the option of attempting to discontinue her famotidine as she has done so well regarding her LPR on her current plan.  She will follow-up with her ophthalmologist regarding further management of her dry eye syndrome.  I will see her back in this clinic in 6 months or earlier if there is a problem.  Allena Katz, MD Allergy / [facility] [place]

## 2018-04-26 ENCOUNTER — Encounter: Payer: Self-pay | Admitting: Allergy and Immunology

## 2018-05-02 ENCOUNTER — Ambulatory Visit: Payer: Medicare Other | Admitting: Family

## 2018-05-02 ENCOUNTER — Encounter (HOSPITAL_COMMUNITY): Payer: Medicare Other

## 2018-05-11 ENCOUNTER — Ambulatory Visit (INDEPENDENT_AMBULATORY_CARE_PROVIDER_SITE_OTHER): Payer: Medicare Other | Admitting: General Practice

## 2018-05-11 DIAGNOSIS — Z7901 Long term (current) use of anticoagulants: Secondary | ICD-10-CM

## 2018-05-11 LAB — POCT INR: INR: 2.2 (ref 2.0–3.0)

## 2018-05-11 NOTE — Patient Instructions (Addendum)
Pre visit review using our clinic review tool, if applicable. No additional management support is needed unless otherwise documented below in the visit note.  Take 2 tablets today (1/17) and then continue to take 1 tablet daily except 1 1/2 tablets on Fridays.   Re-check in 4 weeks.

## 2018-05-15 ENCOUNTER — Ambulatory Visit: Payer: Medicare Other | Admitting: Family

## 2018-05-15 ENCOUNTER — Encounter (HOSPITAL_COMMUNITY): Payer: Medicare Other

## 2018-05-17 ENCOUNTER — Encounter: Payer: Self-pay | Admitting: Family Medicine

## 2018-05-17 ENCOUNTER — Ambulatory Visit (INDEPENDENT_AMBULATORY_CARE_PROVIDER_SITE_OTHER): Payer: Medicare Other | Admitting: Family Medicine

## 2018-05-17 VITALS — BP 124/62 | HR 75 | Temp 97.4°F | Ht 58.75 in | Wt 165.4 lb

## 2018-05-17 DIAGNOSIS — N183 Chronic kidney disease, stage 3 unspecified: Secondary | ICD-10-CM

## 2018-05-17 DIAGNOSIS — I10 Essential (primary) hypertension: Secondary | ICD-10-CM | POA: Diagnosis not present

## 2018-05-17 DIAGNOSIS — E8881 Metabolic syndrome: Secondary | ICD-10-CM

## 2018-05-17 DIAGNOSIS — R739 Hyperglycemia, unspecified: Secondary | ICD-10-CM

## 2018-05-17 DIAGNOSIS — E785 Hyperlipidemia, unspecified: Secondary | ICD-10-CM

## 2018-05-17 DIAGNOSIS — J449 Chronic obstructive pulmonary disease, unspecified: Secondary | ICD-10-CM | POA: Diagnosis not present

## 2018-05-17 DIAGNOSIS — I779 Disorder of arteries and arterioles, unspecified: Secondary | ICD-10-CM | POA: Diagnosis not present

## 2018-05-17 DIAGNOSIS — Z6833 Body mass index (BMI) 33.0-33.9, adult: Secondary | ICD-10-CM | POA: Diagnosis not present

## 2018-05-17 LAB — COMPREHENSIVE METABOLIC PANEL
ALBUMIN: 4.4 g/dL (ref 3.5–5.2)
ALT: 11 U/L (ref 0–35)
AST: 15 U/L (ref 0–37)
Alkaline Phosphatase: 65 U/L (ref 39–117)
BILIRUBIN TOTAL: 0.5 mg/dL (ref 0.2–1.2)
BUN: 19 mg/dL (ref 6–23)
CALCIUM: 9.8 mg/dL (ref 8.4–10.5)
CO2: 29 mEq/L (ref 19–32)
CREATININE: 1.04 mg/dL (ref 0.40–1.20)
Chloride: 104 mEq/L (ref 96–112)
GFR: 49.61 mL/min — ABNORMAL LOW (ref >60.00)
Glucose, Bld: 124 mg/dL — ABNORMAL HIGH (ref 70–99)
Potassium: 4.4 mEq/L (ref 3.5–5.1)
Sodium: 139 mEq/L (ref 135–145)
TOTAL PROTEIN: 6.5 g/dL (ref 6.0–8.3)

## 2018-05-17 LAB — LDL CHOLESTEROL, DIRECT: LDL DIRECT: 48 mg/dL

## 2018-05-17 LAB — CBC
HCT: 40.1 % (ref 36.0–46.0)
HEMOGLOBIN: 13.4 g/dL (ref 12.0–15.0)
MCHC: 33.3 g/dL (ref 30.0–36.0)
MCV: 86.3 fl (ref 78.0–100.0)
PLATELETS: 203 10*3/uL (ref 150.0–400.0)
RBC: 4.64 Mil/uL (ref 3.87–5.11)
RDW: 14 % (ref 11.5–15.5)
WBC: 9.5 10*3/uL (ref 4.0–10.5)

## 2018-05-17 LAB — HEMOGLOBIN A1C: HEMOGLOBIN A1C: 6 % (ref 4.6–6.5)

## 2018-05-17 NOTE — Progress Notes (Signed)
Subjective:  [name] [name] is a 85 y.o. year old very pleasant female patient who presents for/with See problem oriented charting ROS- No chest pain or shortness of breath. No headache or blurry vision. No gout flare ups    Past Medical History-  Patient Active Problem List   Diagnosis Date Noted  . Pain in right ankle and joints of right foot 08/28/2014    Priority: High  . Peripheral arterial disease ([employer]) 06/29/2007    Priority: High  . CKD (chronic kidney disease), stage III ([place]) 08/07/2017    Priority: Medium  . Acute gout of left foot 03/22/2016    Priority: Medium  . Trigeminal neuralgia     Priority: Medium  . Chronic low back pain 09/13/2010    Priority: Medium  . COPD mixed type ([place]) 01/22/2010    Priority: Medium  . Insulin resistance 01/06/2009    Priority: Medium  . Hyperlipidemia 10/23/2006    Priority: Medium  . Essential hypertension 10/23/2006    Priority: Medium  . ESOPHAGEAL STRICTURE 08/09/1993    Priority: Medium  . Obesity (BMI 30.0-34.9) 11/14/2017    Priority: Low  . Encounter for therapeutic drug monitoring 10/21/2013    Priority: Low  . Syncope 12/29/2011    Priority: Low  . Allergic rhinitis due to pollen 06/25/2010    Priority: Low  . Solitary pulmonary nodule 01/12/2010    Priority: Low  . Osteopenia 01/06/2009    Priority: Low  . Eczema 11/26/2007    Priority: Low  . Failed back syndrome 09/28/2017  . Long term (current) use of anticoagulants 02/03/2017  . Unilateral primary osteoarthritis, right knee 04/07/2016    Medications- reviewed and updated Current Outpatient Medications  Medication Sig Dispense Refill  . acetaminophen (TYLENOL) 500 MG tablet Take 500-1,500 mg by mouth 3 (three) times daily as needed for moderate pain. Reported on 08/31/2015    . aspirin EC 81 MG tablet Take 81 mg by mouth daily.    . calcium-vitamin D (OSCAL WITH D 500-200) 500-200 MG-UNIT per tablet Take 1 tablet by mouth daily.      . colchicine  (COLCRYS) 0.6 MG tablet Take 2 pills at first sign gout flare, then another pill 2 hours later if still having pain. Then take once daily until pain resolves. 30 tablet 0  . cromolyn (OPTICROM) 4 % ophthalmic solution INSTILL 1 DROP INTO BOTH EYES 4 TIMES A DAY  99  . diclofenac sodium (VOLTAREN) 1 % GEL Apply 2 g topically 4 (four) times daily. 1 Tube 1  . DULoxetine (CYMBALTA) 60 MG capsule Take 1 capsule (60 mg total) by mouth daily. 30 capsule 5  . famotidine (PEPCID) 10 MG tablet Take 10 mg by mouth daily.    . hydrochlorothiazide (HYDRODIURIL) 25 MG tablet TAKE 1 TABLET BY MOUTH ONCE DAILY 90 tablet 3  . ipratropium (ATROVENT) 0.06 % nasal spray INHALE 2 SPRAYS INTO BOTH NOSTRILS THREE TIMES A DAY 15 mL 8  . irbesartan (AVAPRO) 300 MG tablet TAKE 1 TABLET BY MOUTH ONCE DAILY 90 tablet 3  . levocetirizine (XYZAL) 5 MG tablet Take 5 mg by mouth every evening.    . methocarbamol (ROBAXIN) 500 MG tablet TAKE 1 TABLET BY MOUTH TWICE DAILY AS NEEDED FOR MUSCLE SPASM 60 tablet 5  . omeprazole (PRILOSEC) 20 MG capsule Take 20 mg by mouth every morning.     . pantoprazole (PROTONIX) 40 MG tablet TAKE 1 TABLET BY MOUTH TWICE DAILY 60 tablet 5  . RESTASIS  0.05 % ophthalmic emulsion INSTILL 1 DROP INTO EACH EYE TWICE DAILY  99  . simvastatin (ZOCOR) 40 MG tablet TAKE 1 TABLET BY MOUTH ONCE DAILY AT 6PM 90 tablet 2  . warfarin (COUMADIN) 5 MG tablet Take 1 tablet daily except 1 1/2 tablets on Fridays or AS DIRECTED BY ANTICOAGULATION CLINIC 105 tablet 1   No current facility-administered medications for this visit.     Objective: BP 124/62 (BP Location: Left Arm, Patient Position: Sitting, Cuff Size: Large)   Pulse 75   Temp (!) 97.4 F (36.3 C) (Oral)   Ht 4' 10.75" (1.492 m)   Wt 165 lb 6.4 oz (75 kg)   SpO2 98%   BMI 33.69 kg/m  Gen: NAD, resting comfortably CV: RRR no murmurs rubs or gallops Lungs: CTAB no crackles, wheeze, rhonchi Abdomen: soft/nontender/nondistended/normal bowel  sounds. No rebound or guarding.  Ext: trace edema Skin: warm, dry  Assessment/Plan:  Other notes: 1. PAD- compliant with aspirin and statin given her PAD. Stable- no changes. She is also on warfarin through Dr. Oneida Alar 2. Eye watering better on restasis 3. Dr. Neldon Mc started her on protonix 40mg - also can use pepcid but hasnt had to 4. On cymbalta for her back through Dr. Sherryll Burger resistance/obesity S: Likely A1c trended down last visit to 5.8.  We had planned to diagnose diabetes if she had another A1c 6.5 or above.  She had not done well on metformin in the past due to blurry vision and we question hypoglycemia. Weight is down 3 lbs today from last visit, she states 16 lbs overall. - Cut down on sweets - Does some home exercises and a home foot bike- doing twice everyday!    A/P: hopefully improved based on continued healthy lifestyle changes. Update A1c today BMI monitoring- elevated BMI noted: There is no height or weight on file to calculate BMI. Encouraged need for healthy eating, regular exercise, weight loss.     #Hyperlipidemia S: Compliant with simvastatin at 40 mg.  LDL goal 70 or less due to arterial disease. A/P: likely stable- update direct LDL     #Essential hypertension S: Compliant with irbesartan 300 mg and hydrochlorothiazide 25 mg.  We have considered stopping hydrochlorothiazide if future gout flares-given elevated uric acid level A/P: stable- continue current medications  #Chronic kidney disease stage III S: Patient's GFR has been stable in the 50s.  She knows to avoid NSAIDs- only takes tylenol A/P:presumed stable-  Update CMP today   #COPD S:Smoked for 35 to 40 years but quit around 1994.  No regular use of inhalers. Breathing is actually better with exercise A/P:    Stable. Continue current medications.   No problem-specific Assessment & Plan notes found for this encounter.   Future Appointments  Date Time Provider [place]  05/17/2018   8:20 AM Marin Olp, MD [facility]  06/08/2018  1:30 PM [facility]  06/19/2018 11:00 AM [facility] - [facility]  06/19/2018 11:45 AM Nickel, Sharmon Leyden, NP [facility]  06/28/2018 10:20 AM Jamse Arn, MD [facility]  10/23/2018 10:30 AM Neldon Mc, Donnamarie Poag, MD [facility] None  11/06/2018 10:00 AM [facility] CO[facility]   No follow-ups on file.  Lab/Order associations: No diagnosis found.  No orders of the defined types were placed in this encounter.   Return precautions advised.  [name], MD You can tell the desk

## 2018-05-17 NOTE — Patient Instructions (Addendum)
Great job on all your hard work! Tell your daughter an extra special thank you from me.   You are doing amazing! Keep up the good work   Please stop by lab before you go If you do not have mychart- we will call you about results within 5 business days of [place] receiving them.  If you have mychart- we will send your results within 3 business days of [place] receiving them.  If abnormal or we want to clarify a result, we will call or mychart you to make sure you receive the message.  If you have questions or concerns or don't hear within 5-7 days, please send [place] a message or call us.

## 2018-05-23 ENCOUNTER — Other Ambulatory Visit: Payer: Self-pay | Admitting: Physical Medicine & Rehabilitation

## 2018-05-28 ENCOUNTER — Ambulatory Visit: Payer: Self-pay | Admitting: *Deleted

## 2018-05-28 ENCOUNTER — Ambulatory Visit (INDEPENDENT_AMBULATORY_CARE_PROVIDER_SITE_OTHER): Payer: Medicare Other | Admitting: Family Medicine

## 2018-05-28 ENCOUNTER — Ambulatory Visit (INDEPENDENT_AMBULATORY_CARE_PROVIDER_SITE_OTHER): Payer: Medicare Other

## 2018-05-28 ENCOUNTER — Encounter: Payer: Self-pay | Admitting: Family Medicine

## 2018-05-28 VITALS — BP 130/60 | HR 82 | Temp 98.2°F | Ht 58.5 in | Wt 165.0 lb

## 2018-05-28 DIAGNOSIS — N183 Chronic kidney disease, stage 3 unspecified: Secondary | ICD-10-CM

## 2018-05-28 DIAGNOSIS — K625 Hemorrhage of anus and rectum: Secondary | ICD-10-CM

## 2018-05-28 DIAGNOSIS — R0781 Pleurodynia: Secondary | ICD-10-CM

## 2018-05-28 DIAGNOSIS — W19XXXA Unspecified fall, initial encounter: Secondary | ICD-10-CM

## 2018-05-28 LAB — CBC
HEMATOCRIT: 38.3 % (ref 36.0–46.0)
Hemoglobin: 12.8 g/dL (ref 12.0–15.0)
MCHC: 33.5 g/dL (ref 30.0–36.0)
MCV: 86.2 fl (ref 78.0–100.0)
Platelets: 228 10*3/uL (ref 150.0–400.0)
RBC: 4.45 Mil/uL (ref 3.87–5.11)
RDW: 13.8 % (ref 11.5–15.5)
WBC: 11 10*3/uL — AB (ref 4.0–10.5)

## 2018-05-28 LAB — BASIC METABOLIC PANEL
BUN: 17 mg/dL (ref 6–23)
CALCIUM: 9.4 mg/dL (ref 8.4–10.5)
CHLORIDE: 102 meq/L (ref 96–112)
CO2: 29 meq/L (ref 19–32)
CREATININE: 1.01 mg/dL (ref 0.40–1.20)
GFR: 51.31 mL/min — ABNORMAL LOW (ref >60.00)
GLUCOSE: 151 mg/dL — AB (ref 70–99)
Potassium: 4.5 mEq/L (ref 3.5–5.1)
Sodium: 139 mEq/L (ref 135–145)

## 2018-05-28 MED ORDER — ACETAMINOPHEN-CODEINE #3 300-30 MG PO TABS: 1.0000 | ORAL_TABLET | Freq: Four times a day (QID) | ORAL | 0 refills | Status: DC | PRN

## 2018-05-28 NOTE — Patient Instructions (Addendum)
Sign release of information at the check out desk for records from [facility] for tetanus shot  Trial Tylenol 3 for pain-do not take additional Tylenol along with this.  Can use up to every 6 hours.  Let us know if you have any future bleeding   Please stop by lab before you go If you do not have mychart- we will call you about results within 5 business days of [place] receiving them.  If you have mychart- we will send your results within 3 business days of [place] receiving them.  If abnormal or we want to clarify a result, we will call or mychart you to make sure you receive the message.  If you have questions or concerns or don't hear within 5-7 days, please send [place] a message or call us.

## 2018-05-28 NOTE — Telephone Encounter (Signed)
See note; patient coming at 1140

## 2018-05-28 NOTE — Telephone Encounter (Signed)
Call to office- request patient be seen today- she is high risk. They were able to work her in.  Reason for Disposition . [1] High-risk adult (e.g., age > 40, osteoporosis, chronic steroid use) AND [2] still hurts  Answer Assessment - Initial Assessment Questions 1. MECHANISM: "How did the injury happen?"     Patient fell Saturday afternoon while carrying things into the house- hit concrete planter with side 2. ONSET: "When did the injury happen?" (Minutes or hours ago)     Saturday 3. LOCATION: "Where on the chest is the injury located?"     Left side 4. APPEARANCE: "What does the injury look like?"     No swelling or bruising present- patient did have blood in urine first 2 times she voided after fall- nothing since 5. BLEEDING: "Is there any bleeding now? If so, ask: How long has it been bleeding?"     Abrasion on left hand 6. SEVERITY: "Any difficulty with breathing?"     No problems with breathing 7. SIZE: For cuts, bruises, or swelling, ask: "How large is it?" (e.g., inches or centimeters)     none 8. PAIN: "Is there pain?" If so, ask: "How bad is the pain?"   (e.g., Scale 1-10; or mild, moderate, severe)     A lot of pain- is better today 9. TETANUS: For any breaks in the skin, ask: "When was the last tetanus booster?"     Up to date 10. PREGNANCY: "Is there any chance you are pregnant?" "When was your last menstrual period?"       n/a  Protocols used: CHEST INJURY-A-AH

## 2018-05-28 NOTE — Progress Notes (Signed)
Phone [phone]   Subjective:  [name] [name] is a 85 y.o. year old very pleasant female patient who presents for/with See problem oriented charting ROS-left-sided rib pain without shortness of breath-does have pain with deep breaths though.  Stable trace edema.  No fever or chills  Past Medical History-  Patient Active Problem List   Diagnosis Date Noted  . Pain in right ankle and joints of right foot 08/28/2014    Priority: High  . Peripheral arterial disease ([place]) 06/29/2007    Priority: High  . CKD (chronic kidney disease), stage III ([place]) 08/07/2017    Priority: Medium  . Acute gout of left foot 03/22/2016    Priority: Medium  . Trigeminal neuralgia     Priority: Medium  . Chronic low back pain 09/13/2010    Priority: Medium  . COPD mixed type ([place]) 01/22/2010    Priority: Medium  . Insulin resistance 01/06/2009    Priority: Medium  . Hyperlipidemia 10/23/2006    Priority: Medium  . Essential hypertension 10/23/2006    Priority: Medium  . ESOPHAGEAL STRICTURE 08/09/1993    Priority: Medium  . Obesity (BMI 30.0-34.9) 11/14/2017    Priority: Low  . Unilateral primary osteoarthritis, right knee 04/07/2016    Priority: Low  . Encounter for therapeutic drug monitoring 10/21/2013    Priority: Low  . Syncope 12/29/2011    Priority: Low  . Allergic rhinitis due to pollen 06/25/2010    Priority: Low  . Solitary pulmonary nodule 01/12/2010    Priority: Low  . Osteopenia 01/06/2009    Priority: Low  . Eczema 11/26/2007    Priority: Low  . Failed back syndrome 09/28/2017  . Long term (current) use of anticoagulants 02/03/2017    Medications- reviewed and updated Current Outpatient Medications  Medication Sig Dispense Refill  . acetaminophen (TYLENOL) 500 MG tablet Take 500-1,500 mg by mouth 3 (three) times daily as needed for moderate pain. Reported on 08/31/2015    . aspirin EC 81 MG tablet Take 81 mg by mouth daily.    . calcium-vitamin D (OSCAL WITH D 500-200)  500-200 MG-UNIT per tablet Take 1 tablet by mouth daily.      . colchicine (COLCRYS) 0.6 MG tablet Take 2 pills at first sign gout flare, then another pill 2 hours later if still having pain. Then take once daily until pain resolves. 30 tablet 0  . diclofenac sodium (VOLTAREN) 1 % GEL Apply 2 g topically 4 (four) times daily. 100 g 0  . DULoxetine (CYMBALTA) 60 MG capsule Take 1 capsule (60 mg total) by mouth daily. 30 capsule 5  . hydrochlorothiazide (HYDRODIURIL) 25 MG tablet TAKE 1 TABLET BY MOUTH ONCE DAILY 90 tablet 3  . ipratropium (ATROVENT) 0.06 % nasal spray INHALE 2 SPRAYS INTO BOTH NOSTRILS THREE TIMES A DAY 15 mL 8  . irbesartan (AVAPRO) 300 MG tablet TAKE 1 TABLET BY MOUTH ONCE DAILY 90 tablet 3  . levocetirizine (XYZAL) 5 MG tablet Take 5 mg by mouth every evening.    . methocarbamol (ROBAXIN) 500 MG tablet TAKE 1 TABLET BY MOUTH TWICE DAILY AS NEEDED FOR MUSCLE SPASM 60 tablet 5  . pantoprazole (PROTONIX) 40 MG tablet TAKE 1 TABLET BY MOUTH TWICE DAILY 60 tablet 5  . RESTASIS 0.05 % ophthalmic emulsion INSTILL 1 DROP INTO EACH EYE TWICE DAILY  99  . simvastatin (ZOCOR) 40 MG tablet TAKE 1 TABLET BY MOUTH ONCE DAILY AT 6PM 90 tablet 2  . warfarin (COUMADIN) 5 MG tablet  Take 1 tablet daily except 1 1/2 tablets on Fridays or AS DIRECTED BY ANTICOAGULATION CLINIC 105 tablet 1  . acetaminophen-codeine (TYLENOL #3) 300-30 MG tablet Take 1 tablet by mouth every 6 (six) hours as needed for moderate pain. 20 tablet 0   No current facility-administered medications for this visit.      Objective:  BP 130/60 (BP Location: Right Arm, Patient Position: Sitting, Cuff Size: Large)   Pulse 82   Temp 98.2 F (36.8 C) (Oral)   Ht 4' 10.5" (1.486 m)   Wt 165 lb (74.8 kg)   SpO2 96%   BMI 33.90 kg/m  Gen: NAD, resting comfortably CV: RRR no murmurs rubs or gallops Lungs: CTAB no crackles, wheeze, rhonchi Patient very tender over left lateral lower ribs-no significant bruising noted Abdomen:  soft/nontender/nondistended/normal bowel sounds. No rebound or guarding.  Ext: no edema Skin: warm, dry, healed over laceration on left hand   Dg Ribs Unilateral W/chest Left  Result Date: 05/28/2018 CLINICAL DATA:  Left rib pain. EXAM: LEFT RIBS AND CHEST - 3+ VIEW COMPARISON:  Radiographs of December 14, 2015. FINDINGS: No fracture or other bone lesions are seen involving the ribs. There is no evidence of pneumothorax or pleural effusion. Both lungs are clear. Heart size and mediastinal contours are within normal limits. IMPRESSION: Negative. Electronically Signed   By: Marijo Conception, M.D.   On: 05/28/2018 16:37    I also independently reviewed this film with the patient- I did not see any obvious fractures at time of examination.  We also discussed no obvious pneumonia, pneumothorax, pleural effusion.    Assessment and Plan    Rib pain on left side - Plan: DG Ribs Unilateral W/Chest Left, CBC, Basic metabolic panel  S: Patient with significant left-sided rib pain.  She Fell onto concrete plantar in bottom left side of ribs.  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   The fall occurred because she was trying to hold onto pizza and mail- trying to hold onto too much- they started to slip and she tried to readjust and ended up falling (counseling provided on how to avoid falls today).   Saturday night passed light blood in stools- none since that time  Ok sitting but getting up really hurts. Complains of severe aching painTaking tylenol in morning at night. Tramadol allergy but states has done ok with codeine in past- requests stronger pain control than tylenol  A/P: 85 year old female with recent fall and now with significant left-sided rib pain -x-rays do not reveal obvious rib fracture on left side-suggesting more likely significant pain in musculature or bruising of bone as cause of pain. -We will treat with  codeine No. 3 for pain management-advise no additional Tylenol with this - Does have a scrape on the left hand- likely tetanus shot within a week-she is going to sign records for [place] to get a copy -Due to rectal bleeding-updated CBC which did not show anemia-patient will return if recurrent issues -With the fall she also wanted me to evaluate her kidney function given baseline CKD stage III-fortunately this was stable at her baseline   Future Appointments  Date Time Provider [place]  06/08/2018  1:30 PM [facility]  06/19/2018 11:00 AM [facility] 2 - [facility]  06/19/2018 11:45 AM Nickel, Sharmon Leyden, NP [facility]  06/28/2018 10:20 AM Jamse Arn, MD  [facility]  10/23/2018 10:30 AM Kozlow, Donnamarie Poag, MD [facility] None  11/06/2018 10:00 AM [facility]  11/15/2018 10:40 AM Yong Channel, Brayton Mars, MD [facility]   No follow-ups on file.  Lab/Order associations: Rib pain on left side - Plan: DG Ribs Unilateral W/Chest Left, CBC, Basic metabolic panel, CANCELED: Comprehensive metabolic panel  Meds ordered this encounter  Medications  . acetaminophen-codeine (TYLENOL #3) 300-30 MG tablet    Sig: Take 1 tablet by mouth every 6 (six) hours as needed for moderate pain.    Dispense:  20 tablet    Refill:  0    Return precautions advised.  [name], MD

## 2018-05-30 ENCOUNTER — Telehealth: Payer: Self-pay | Admitting: Family Medicine

## 2018-05-30 ENCOUNTER — Encounter: Payer: Self-pay | Admitting: Family Medicine

## 2018-05-30 NOTE — Telephone Encounter (Signed)
I sent her my chart message- showed Tillie Rung this note and she will communicate with patient

## 2018-05-30 NOTE — Telephone Encounter (Signed)
Pt notified of results and verbalized understanding  

## 2018-05-30 NOTE — Telephone Encounter (Signed)
I have results for labs but none for imaging. After viewing imaging is it okay to inform pt that it was negative? Please advise

## 2018-05-30 NOTE — Telephone Encounter (Signed)
Copied from [place] [id number]. Topic: Quick Communication - See Telephone Encounter >> May 30, 2018 10:10 AM Sheran Luz wrote: CRM for notification. See Telephone encounter for: 05/30/18.  Patient calling to check status of imaging and lab results from 05/28/18. Please advise.

## 2018-05-30 NOTE — Telephone Encounter (Signed)
See note

## 2018-06-01 ENCOUNTER — Ambulatory Visit: Payer: Self-pay

## 2018-06-01 ENCOUNTER — Ambulatory Visit (INDEPENDENT_AMBULATORY_CARE_PROVIDER_SITE_OTHER): Payer: Medicare Other | Admitting: General Practice

## 2018-06-01 ENCOUNTER — Telehealth: Payer: Self-pay

## 2018-06-01 ENCOUNTER — Ambulatory Visit: Payer: Medicare Other | Admitting: Physician Assistant

## 2018-06-01 ENCOUNTER — Ambulatory Visit (INDEPENDENT_AMBULATORY_CARE_PROVIDER_SITE_OTHER): Payer: Medicare Other | Admitting: Internal Medicine

## 2018-06-01 ENCOUNTER — Ambulatory Visit: Payer: Medicare Other | Admitting: Family Medicine

## 2018-06-01 ENCOUNTER — Other Ambulatory Visit (INDEPENDENT_AMBULATORY_CARE_PROVIDER_SITE_OTHER): Payer: Medicare Other

## 2018-06-01 ENCOUNTER — Encounter: Payer: Self-pay | Admitting: Internal Medicine

## 2018-06-01 VITALS — BP 120/60 | HR 75 | Temp 97.5°F | Ht 58.5 in | Wt 168.0 lb

## 2018-06-01 DIAGNOSIS — Z7901 Long term (current) use of anticoagulants: Secondary | ICD-10-CM

## 2018-06-01 DIAGNOSIS — R310 Gross hematuria: Secondary | ICD-10-CM | POA: Insufficient documentation

## 2018-06-01 LAB — POCT URINALYSIS DIPSTICK
Bilirubin, UA: NEGATIVE
Glucose, UA: NEGATIVE
Ketones, UA: NEGATIVE
NITRITE UA: NEGATIVE
PH UA: 6 (ref 5.0–8.0)
PROTEIN UA: POSITIVE — AB
Spec Grav, UA: 1.02 (ref 1.010–1.025)
Urobilinogen, UA: 0.2 E.U./dL

## 2018-06-01 LAB — PROTIME-INR
INR: 3.8 ratio — AB (ref 0.8–1.0)
PROTHROMBIN TIME: 43.8 s — AB (ref 9.6–13.1)

## 2018-06-01 LAB — CBC
HCT: 34.6 % — ABNORMAL LOW (ref 36.0–46.0)
Hemoglobin: 11.7 g/dL — ABNORMAL LOW (ref 12.0–15.0)
MCHC: 33.8 g/dL (ref 30.0–36.0)
MCV: 85.6 fl (ref 78.0–100.0)
Platelets: 245 10*3/uL (ref 150.0–400.0)
RBC: 4.04 Mil/uL (ref 3.87–5.11)
RDW: 13.8 % (ref 11.5–15.5)
WBC: 10.7 10*3/uL — AB (ref 4.0–10.5)

## 2018-06-01 NOTE — Telephone Encounter (Signed)
Pt c/o blood in her urine that was first noted on Tuesday. Pt c/o bright red urine passed this morning x 2. Pt c/o pressure to her lower abdomen. Pt denies dizziness or lightheadedness. Pt sustained a fall earlier this week and feel left side lower rib area. Xray was negative for rib fracture. Pt takes coumadin daily. Called PCP office to report triage information to [facility]. Marvel Plan stated that pt can be seen by another provider. Offered an appointment with Inda Coke PA but pt could not come when appointments were available with that provider. Pt accepted appointment with Dr Rogers Blocker at   Reason for Disposition . Taking Coumadin (warfarin) or other strong blood thinner, or known bleeding disorder (e.g., thrombocytopenia)  Answer Assessment - Initial Assessment Questions 1. COLOR of URINE: "Describe the color of the urine."  (e.g., tea-colored, pink, red, blood clots, bloody)     Bright red 2. ONSET: "When did the bleeding start?"      Tuesday 3. EPISODES: "How many times has there been blood in the urine?" or "How many times today?"     Twice today 4. PAIN with URINATION: "Is there any pain with passing your urine?" If so, ask: "How bad is the pain?"  (Scale 1-10; or mild, moderate, severe)    - MILD - complains slightly about urination hurting    - MODERATE - interferes with normal activities      - SEVERE - excruciating, unwilling or unable to urinate because of the pain      no 5. FEVER: "Do you have a fever?" If so, ask: "What is your temperature, how was it measured, and when did it start?"     no 6. ASSOCIATED SYMPTOMS: "Are you passing urine more frequently than usual?"     Pressure lower abdomen 7. OTHER SYMPTOMS: "Do you have any other symptoms?" (e.g., back/flank pain, abdominal pain, vomiting)     Lower abdominal pain 8. PREGNANCY: "Is there any chance you are pregnant?" "When was your last menstrual period?"     n/a  Protocols used: URINE - BLOOD IN-A-AH

## 2018-06-01 NOTE — Patient Instructions (Signed)
We will check the blood work today and call you back about the results.   If the bleeding worsens or does not stop call us back or send a mychart message and we can consider doing an ultrasound.

## 2018-06-01 NOTE — Progress Notes (Signed)
Medical treatment/procedure(s) were performed by non-physician practitioner and as supervising physician I was immediately available for consultation/collaboration. I agree with above. Elizabeth A Crawford, MD  

## 2018-06-01 NOTE — Progress Notes (Signed)
   Subjective:   Patient ID: [name] [name], female    DOB: May 10, 1924, 85 y.o.   MRN: [id number]  HPI The patient is a 85 YO female coming in for some blood in urine. She had a fall last Saturday with impact on her left side. She has bruising around her inguinal area left sided. She denies LOC and was evaluated previously by her PCP for this. She is feeling better as far as pain goes. She does have chronic lumbar back pain which is unchanged. She denies pain in the middle back or flank. She denies pain with urinating. She had 1 episode of blood in urine this morning and passed some old looking blood last night. She did also have 1 episode of blood in urine right after fall. She is on coumadin. Denies missing doses or taking additional doses.   Review of Systems  Constitutional: Negative.   HENT: Negative.   Eyes: Negative.   Respiratory: Negative for cough, chest tightness and shortness of breath.   Cardiovascular: Negative for chest pain, palpitations and leg swelling.  Gastrointestinal: Negative for abdominal distention, abdominal pain, constipation, diarrhea, nausea and vomiting.  Genitourinary: Positive for hematuria.  Musculoskeletal: Negative.   Skin: Negative.   Neurological: Negative.   Psychiatric/Behavioral: Negative.     Objective:  Physical Exam Constitutional:      Appearance: She is well-developed.  HENT:     Head: Normocephalic and atraumatic.  Neck:     Musculoskeletal: Normal range of motion.  Cardiovascular:     Rate and Rhythm: Normal rate and regular rhythm.  Pulmonary:     Effort: Pulmonary effort is normal. No respiratory distress.     Breath sounds: Normal breath sounds. No wheezing or rales.  Abdominal:     General: Bowel sounds are normal. There is no distension.     Palpations: Abdomen is soft.     Tenderness: There is no abdominal tenderness. There is no rebound.  Musculoskeletal:        General: Tenderness present.     Comments: Tenderness lumbar,  no flank pain or tenderness  Skin:    General: Skin is warm and dry.  Neurological:     Mental Status: She is alert and oriented to person, place, and time.     Coordination: Coordination normal.     Vitals:   06/01/18 1444  BP: 120/60  Pulse: 75  Temp: (!) 97.5 F (36.4 C)  TempSrc: Oral  SpO2: 94%  Weight: 168 lb (76.2 kg)  Height: 4' 10.5" (1.486 m)    Assessment & Plan:

## 2018-06-01 NOTE — Telephone Encounter (Signed)
[facility] called regarding Pt c/o blood in her urine that was first noted on Tuesday. Pt c/o bright red urine passed this morning x 2. Pt c/o pressure to her lower abdomen.  Pt  offered an appointment with Inda Coke PA.

## 2018-06-01 NOTE — Assessment & Plan Note (Signed)
Suspect bladder impact from fall given bruising pattern. Checking CBC and INR for bleeding and risk of bleeding. Given no pain on exam this makes renal bruising unlikely. Will monitor over the weekend and if worsening or not resolving can consider renal US Monday.

## 2018-06-01 NOTE — Patient Instructions (Signed)
Pre visit review using our clinic review tool, if applicable. No additional management support is needed unless otherwise documented below in the visit note.  Hold coumadin today and take 1/2 tablet tomorrow (2/7 and 2/8).  On Monday continue current dosage of 1 tablet daily except 1 1/2 tablets on Fridays.   Re-check in 1 week.  Patient saw Dr. Sharlet Salina today for an OV.  I phoned patient with INR results from lab and gave new dosing instructions so NO charges for coumadin dosing today.

## 2018-06-02 ENCOUNTER — Other Ambulatory Visit: Payer: Self-pay | Admitting: Physical Medicine & Rehabilitation

## 2018-06-04 MED ORDER — DICLOFENAC SODIUM 1 % TD GEL: TRANSDERMAL | 0 refills | Status: DC

## 2018-06-04 NOTE — Addendum Note (Signed)
Addended by: Geryl Rankins D on: 06/04/2018 08:21 AM   Modules accepted: Orders

## 2018-06-08 ENCOUNTER — Ambulatory Visit (INDEPENDENT_AMBULATORY_CARE_PROVIDER_SITE_OTHER): Payer: Medicare Other | Admitting: General Practice

## 2018-06-08 DIAGNOSIS — Z7901 Long term (current) use of anticoagulants: Secondary | ICD-10-CM | POA: Diagnosis not present

## 2018-06-08 LAB — POCT INR: INR: 1.6 — AB (ref 2.0–3.0)

## 2018-06-08 NOTE — Patient Instructions (Addendum)
Pre visit review using our clinic review tool, if applicable. No additional management support is needed unless otherwise documented below in the visit note.  Take 2 tablets today (2/14) and take 1 1/2 tablets tomorrow  (2/15).  On Sunday change dosage and take 1 tablet daily.  Re-check in 3 weeks.

## 2018-06-13 ENCOUNTER — Other Ambulatory Visit: Payer: Self-pay

## 2018-06-13 DIAGNOSIS — I739 Peripheral vascular disease, unspecified: Secondary | ICD-10-CM

## 2018-06-13 DIAGNOSIS — I779 Disorder of arteries and arterioles, unspecified: Secondary | ICD-10-CM

## 2018-06-13 DIAGNOSIS — Z87891 Personal history of nicotine dependence: Secondary | ICD-10-CM

## 2018-06-13 DIAGNOSIS — Z95828 Presence of other vascular implants and grafts: Secondary | ICD-10-CM

## 2018-06-19 ENCOUNTER — Telehealth: Payer: Self-pay | Admitting: *Deleted

## 2018-06-19 ENCOUNTER — Encounter: Payer: Self-pay | Admitting: Family

## 2018-06-19 ENCOUNTER — Encounter: Payer: Self-pay | Admitting: *Deleted

## 2018-06-19 ENCOUNTER — Other Ambulatory Visit: Payer: Self-pay

## 2018-06-19 ENCOUNTER — Ambulatory Visit (HOSPITAL_COMMUNITY)
Admission: RE | Admit: 2018-06-19 | Discharge: 2018-06-19 | Disposition: A | Discharge status: Home / Self Care | Payer: Medicare Other | Source: Ambulatory Visit | Attending: Vascular Surgery | Admitting: Vascular Surgery

## 2018-06-19 ENCOUNTER — Ambulatory Visit (INDEPENDENT_AMBULATORY_CARE_PROVIDER_SITE_OTHER): Payer: Medicare Other | Admitting: Physician Assistant

## 2018-06-19 VITALS — BP 145/65 | HR 74 | Temp 97.9°F | Resp 16 | Ht 59.0 in | Wt 160.0 lb

## 2018-06-19 DIAGNOSIS — I739 Peripheral vascular disease, unspecified: Secondary | ICD-10-CM | POA: Insufficient documentation

## 2018-06-19 DIAGNOSIS — Z95828 Presence of other vascular implants and grafts: Secondary | ICD-10-CM | POA: Diagnosis not present

## 2018-06-19 DIAGNOSIS — I779 Disorder of arteries and arterioles, unspecified: Secondary | ICD-10-CM

## 2018-06-19 DIAGNOSIS — Z87891 Personal history of nicotine dependence: Secondary | ICD-10-CM | POA: Diagnosis not present

## 2018-06-19 NOTE — Telephone Encounter (Signed)
1) What type of surgery is being performed?       Abdominal aortogram with bilateral lower extremity runoff  2) When is this surgery scheduled? Friday 06-22-2018  3) What type of clearance is required (Medical, Pharmacy or Both)? Pharmacy, coumadin hold   4) Are there any medications that need to be held prior to surgery and how long? Coumadin was stopped today 06-19-2018 for 3 day hold prior to procedure   5) Practice name and name of physician performing surgery? [facility],  Dr. [name]  6) What is your office phone number? Leota Jacobsen, RN Surgical / Triage Nurse [facility]   [phone]

## 2018-06-19 NOTE — H&P (View-Only) (Signed)
[facility] HISTORY AND PHYSICAL   History of Present Illness:  Patient is a 85 y.o. year old female who presents for evaluation of bilateral lower extremities.  She is status post bilateral femoropopliteal bypass with [name] [phone] and 2009), she's also had thrombolysis angioplasty of the right femoropopliteal bypass in 2010, again on 10/13/13, and thrombectomy angioplasty right interpositional graft from above the knee to below the knee popliteal artery in 2010.   She denies symptoms of claudication.  She has no open non healing wounds and no rest pain.   Her walkingwas limited by low back pain, she has had lumbar fusion in March, 2013.  Past Medical History:  Diagnosis Date  . Allergic rhinoconjunctivitis   . Anxiety    pt. managed- uses deep breathing   . Arthritis    low back , stenosis  . COLONIC POLYPS, RECURRENT 08/29/2006   2008 last colonoscopy. No further colonoscopy.     Marland Kitchen COPD (chronic obstructive pulmonary disease) ([place])   . Diverticulosis   . DVT (deep venous thrombosis) ([place])   . Eczema   . Environmental allergies    allergy shot- q friday in Dr. Janee Morn office. PFT's abnormal- recommended Spiriva to use preop & will d/c after surgery  . GERD (gastroesophageal reflux disease)   . Hiatal hernia   . Hyperlipidemia   . Hypertension   . Lung nodule 2011  . Peripheral vascular disease ([place])   . Shingles   . Stenosis of popliteal artery ([facility])    blood clots in legs long ago    . Trigeminal neuralgia     Past Surgical History:  Procedure Laterality Date  . ABDOMINAL AORTAGRAM N/A 06/17/2011   Procedure: ABDOMINAL Maxcine Ham;  Surgeon: Elam Dutch, MD;  Location: [facility] CATH LAB;  Service: Cardiovascular;  Laterality: N/A;  . CATARACT EXTRACTION, BILATERAL     w IOL  . CHOLECYSTECTOMY  1998  . FEMORAL-POPLITEAL BYPASS GRAFT        x2 surgeries 1990's & 2009  . INJECTION KNEE Right Aug. 2016   Gel injection for pain  . LUMBAR  FUSION  07/06/2011  . TONSILLECTOMY     as a teenager   . TUBAL LIGATION      ROS:   General:  No weight loss, Fever, chills  HEENT: No recent headaches, no nasal bleeding, no visual changes, no sore throat  Neurologic: No dizziness, blackouts, seizures. No recent symptoms of stroke or mini- stroke. No recent episodes of slurred speech, or temporary blindness.  Cardiac: No recent episodes of chest pain/pressure, no shortness of breath at rest.  No shortness of breath with exertion.  Denies history of atrial fibrillation or irregular heartbeat  Vascular: No history of rest pain in feet.  No history of claudication.  No history of non-healing ulcer, No history of DVT   Pulmonary: No home oxygen, no productive cough, no hemoptysis,  No asthma or wheezing  Musculoskeletal:  [ ]  Arthritis, [ ]  Low back pain,  [ ]  Joint pain  Hematologic:No history of hypercoagulable state.  No history of easy bleeding.  No history of anemia  Gastrointestinal: No hematochezia or melena,  No gastroesophageal reflux, no trouble swallowing  Urinary: [ ]  chronic Kidney disease, [ ]  on HD - [ ]  MWF or [ ]  TTHS, [ ]  Burning with urination, [ ]  Frequent urination, [ ]  Difficulty urinating;   Skin: No rashes  Psychological: No history of anxiety,  No history of depression  Social History Social History   Tobacco Use  . Smoking status: Former Smoker    Packs/day: 2.00    Years: 30.00    Pack years: 60.00    Types: Cigarettes    Last attempt to quit: 06/18/1993    Years since quitting: 25.0  . Smokeless tobacco: Never Used  . Tobacco comment: QUIT IN 1995  Substance Use Topics  . Alcohol use: No    Alcohol/week: 0.0 standard drinks  . Drug use: No    Family History Family History  Problem Relation Age of Onset  . Arthritis Mother   . Hypertension Mother   . Heart disease Father   . Colon polyps Father   . Breast cancer Sister   . Breast cancer Daughter   . Hypertension Son   . Lung cancer  Brother   . Colon cancer Sister   . Brain cancer Sister   . Lung cancer Sister   . Anesthesia problems Neg Hx   . Hypotension Neg Hx   . Malignant hyperthermia Neg Hx   . Pseudochol deficiency Neg Hx     Allergies  Allergies  Allergen Reactions  . Sulfa Antibiotics Other (See Comments)    Cold sweat light headed and disorientation  . Tiotropium Bromide Shortness Of Breath and Other (See Comments)    Sore throat also  . Ultram [Tramadol] Nausea And Vomiting  . Latex Itching and Rash     Current Outpatient Medications  Medication Sig Dispense Refill  . acetaminophen (TYLENOL) 500 MG tablet Take 500-1,500 mg by mouth 3 (three) times daily as needed for moderate pain. Reported on 08/31/2015    . acetaminophen-codeine (TYLENOL #3) 300-30 MG tablet Take 1 tablet by mouth every 6 (six) hours as needed for moderate pain. 20 tablet 0  . aspirin EC 81 MG tablet Take 81 mg by mouth daily.    . calcium-vitamin D (OSCAL WITH D 500-200) 500-200 MG-UNIT per tablet Take 1 tablet by mouth daily.      . colchicine (COLCRYS) 0.6 MG tablet Take 2 pills at first sign gout flare, then another pill 2 hours later if still having pain. Then take once daily until pain resolves. 30 tablet 0  . diclofenac sodium (VOLTAREN) 1 % GEL APPLY 2 GRAMS TO AFFECTED AREA  TOPICALLY 4 TIMES DAILY 100 g 0  . DULoxetine (CYMBALTA) 60 MG capsule Take 1 capsule (60 mg total) by mouth daily. 30 capsule 5  . hydrochlorothiazide (HYDRODIURIL) 25 MG tablet TAKE 1 TABLET BY MOUTH ONCE DAILY 90 tablet 3  . ipratropium (ATROVENT) 0.06 % nasal spray INHALE 2 SPRAYS INTO BOTH NOSTRILS THREE TIMES A DAY 15 mL 8  . irbesartan (AVAPRO) 300 MG tablet TAKE 1 TABLET BY MOUTH ONCE DAILY 90 tablet 3  . levocetirizine (XYZAL) 5 MG tablet Take 5 mg by mouth every evening.    . methocarbamol (ROBAXIN) 500 MG tablet TAKE 1 TABLET BY MOUTH TWICE DAILY AS NEEDED FOR MUSCLE SPASM 60 tablet 5  . pantoprazole (PROTONIX) 40 MG tablet TAKE 1 TABLET BY  MOUTH TWICE DAILY 60 tablet 5  . RESTASIS 0.05 % ophthalmic emulsion INSTILL 1 DROP INTO EACH EYE TWICE DAILY  99  . simvastatin (ZOCOR) 40 MG tablet TAKE 1 TABLET BY MOUTH ONCE DAILY AT 6PM 90 tablet 2  . warfarin (COUMADIN) 5 MG tablet Take 1 tablet daily except 1 1/2 tablets on Fridays or AS DIRECTED BY ANTICOAGULATION CLINIC 105 tablet 1   No current facility-administered medications for this  visit.     Physical Examination  Vitals:   06/19/18 1106  BP: (!) 145/65  Pulse: 74  Resp: 16  Temp: 97.9 F (36.6 C)  TempSrc: Oral  SpO2: 96%  Weight: 160 lb (72.6 kg)  Height: 4\' 11"  (1.499 m)    Body mass index is 32.32 kg/m.  General:  Alert and oriented, no acute distress HEENT: Normal,  normocephalic Neck: No bruit or JVD Pulmonary: Clear to auscultation bilaterally Cardiac: Regular Rate and Rhythm without murmur Abdomen: Soft, non-tender, non-distended, no mass, no scars Skin: No rash Extremity Pulses:  2+ radial, right femoral I can not palpate the left femoral, non palpable dorsalis pedis, posterior tibial pulses bilaterally Musculoskeletal: No deformity or edema  Neurologic: Upper and lower extremity motor 5/5 and symmetric  DATA:   +-------+-----------+-----------+------------+------------+ ABI/TBIToday's ABIToday's TBIPrevious ABIPrevious TBI +-------+-----------+-----------+------------+------------+ Right  0.55       0.29       0.99        0.61         +-------+-----------+-----------+------------+------------+ Left   0.62       0.39       0.96        0.60         +-------+-----------+-----------+------------+------------+   ASSESSMENT:  right femoral to above-knee popliteal bypass by [name] in 2006. I revised this and extended to the below-knee popliteal artery and 2010. She has also previously had left femoral to above-knee popliteal bypass by [name] 2006.   Thrombolysis for a right femoral to below-knee popliteal bypass graft by  Dr. Oneida Alar in the past 2015.     PLAN:  She has no new symptoms, no open wounds or history of non healing wounds.  He ABI's have dropped in B LE I am scheduling her for angiogram and possible intervention.  She is on Coumadin for history of bypass thrombolysis and DVT.  She will hold her Coumadin until she has the procedure on Friday 06/22/2018.   [name], MD [facility] Office: [phone] Pager: [phone]

## 2018-06-19 NOTE — Progress Notes (Signed)
Aortogram scheduled for Friday 06-22-2018 with Dr. Oneida Alar

## 2018-06-19 NOTE — Progress Notes (Addendum)
[facility] [place] HISTORY AND PHYSICAL   History of Present Illness:  Patient is a 85 y.o. year old female who presents for evaluation of bilateral lower extremities.  She is status post bilateral femoropopliteal bypass with [name] [phone] and 2009), she's also had thrombolysis angioplasty of the right femoropopliteal bypass in 2010, again on 10/13/13, and thrombectomy angioplasty right interpositional graft from above the knee to below the knee popliteal artery in 2010.   She denies symptoms of claudication.  She has no open non healing wounds and no rest pain.   Her walkingwas limited by low back pain, she has had lumbar fusion in March, 2013.  Past Medical History:  Diagnosis Date  . Allergic rhinoconjunctivitis   . Anxiety    pt. managed- uses deep breathing   . Arthritis    low back , stenosis  . COLONIC POLYPS, RECURRENT 08/29/2006   2008 last colonoscopy. No further colonoscopy.     Marland Kitchen COPD (chronic obstructive pulmonary disease) ([place])   . Diverticulosis   . DVT (deep venous thrombosis) ([place])   . Eczema   . Environmental allergies    allergy shot- q friday in Dr. Janee Morn office. PFT's abnormal- recommended Spiriva to use preop & will d/c after surgery  . GERD (gastroesophageal reflux disease)   . Hiatal hernia   . Hyperlipidemia   . Hypertension   . Lung nodule 2011  . Peripheral vascular disease ([place])   . Shingles   . Stenosis of popliteal artery ([facility])    blood clots in legs long ago    . Trigeminal neuralgia     Past Surgical History:  Procedure Laterality Date  . ABDOMINAL AORTAGRAM N/A 06/17/2011   Procedure: ABDOMINAL Maxcine Ham;  Surgeon: Elam Dutch, MD;  Location: [facility] CATH LAB;  Service: Cardiovascular;  Laterality: N/A;  . CATARACT EXTRACTION, BILATERAL     w IOL  . CHOLECYSTECTOMY  1998  . FEMORAL-POPLITEAL BYPASS GRAFT        x2 surgeries 1990's & 2009  . INJECTION KNEE Right Aug. 2016   Gel injection for pain  . LUMBAR  FUSION  07/06/2011  . TONSILLECTOMY     as a teenager   . TUBAL LIGATION      ROS:   General:  No weight loss, Fever, chills  HEENT: No recent headaches, no nasal bleeding, no visual changes, no sore throat  Neurologic: No dizziness, blackouts, seizures. No recent symptoms of stroke or mini- stroke. No recent episodes of slurred speech, or temporary blindness.  Cardiac: No recent episodes of chest pain/pressure, no shortness of breath at rest.  No shortness of breath with exertion.  Denies history of atrial fibrillation or irregular heartbeat  Vascular: No history of rest pain in feet.  No history of claudication.  No history of non-healing ulcer, No history of DVT   Pulmonary: No home oxygen, no productive cough, no hemoptysis,  No asthma or wheezing  Musculoskeletal:  [ ]  Arthritis, [ ]  Low back pain,  [ ]  Joint pain  Hematologic:No history of hypercoagulable state.  No history of easy bleeding.  No history of anemia  Gastrointestinal: No hematochezia or melena,  No gastroesophageal reflux, no trouble swallowing  Urinary: [ ]  chronic Kidney disease, [ ]  on HD - [ ]  MWF or [ ]  TTHS, [ ]  Burning with urination, [ ]  Frequent urination, [ ]  Difficulty urinating;   Skin: No rashes  Psychological: No history of anxiety,  No history of depression  Social History Social History   Tobacco Use  . Smoking status: Former Smoker    Packs/day: 2.00    Years: 30.00    Pack years: 60.00    Types: Cigarettes    Last attempt to quit: 06/18/1993    Years since quitting: 25.0  . Smokeless tobacco: Never Used  . Tobacco comment: QUIT IN 1995  Substance Use Topics  . Alcohol use: No    Alcohol/week: 0.0 standard drinks  . Drug use: No    Family History Family History  Problem Relation Age of Onset  . Arthritis Mother   . Hypertension Mother   . Heart disease Father   . Colon polyps Father   . Breast cancer Sister   . Breast cancer Daughter   . Hypertension Son   . Lung cancer  Brother   . Colon cancer Sister   . Brain cancer Sister   . Lung cancer Sister   . Anesthesia problems Neg Hx   . Hypotension Neg Hx   . Malignant hyperthermia Neg Hx   . Pseudochol deficiency Neg Hx     Allergies  Allergies  Allergen Reactions  . Sulfa Antibiotics Other (See Comments)    Cold sweat light headed and disorientation  . Tiotropium Bromide Shortness Of Breath and Other (See Comments)    Sore throat also  . Ultram [Tramadol] Nausea And Vomiting  . Latex Itching and Rash     Current Outpatient Medications  Medication Sig Dispense Refill  . acetaminophen (TYLENOL) 500 MG tablet Take 500-1,500 mg by mouth 3 (three) times daily as needed for moderate pain. Reported on 08/31/2015    . acetaminophen-codeine (TYLENOL #3) 300-30 MG tablet Take 1 tablet by mouth every 6 (six) hours as needed for moderate pain. 20 tablet 0  . aspirin EC 81 MG tablet Take 81 mg by mouth daily.    . calcium-vitamin D (OSCAL WITH D 500-200) 500-200 MG-UNIT per tablet Take 1 tablet by mouth daily.      . colchicine (COLCRYS) 0.6 MG tablet Take 2 pills at first sign gout flare, then another pill 2 hours later if still having pain. Then take once daily until pain resolves. 30 tablet 0  . diclofenac sodium (VOLTAREN) 1 % GEL APPLY 2 GRAMS TO AFFECTED AREA  TOPICALLY 4 TIMES DAILY 100 g 0  . DULoxetine (CYMBALTA) 60 MG capsule Take 1 capsule (60 mg total) by mouth daily. 30 capsule 5  . hydrochlorothiazide (HYDRODIURIL) 25 MG tablet TAKE 1 TABLET BY MOUTH ONCE DAILY 90 tablet 3  . ipratropium (ATROVENT) 0.06 % nasal spray INHALE 2 SPRAYS INTO BOTH NOSTRILS THREE TIMES A DAY 15 mL 8  . irbesartan (AVAPRO) 300 MG tablet TAKE 1 TABLET BY MOUTH ONCE DAILY 90 tablet 3  . levocetirizine (XYZAL) 5 MG tablet Take 5 mg by mouth every evening.    . methocarbamol (ROBAXIN) 500 MG tablet TAKE 1 TABLET BY MOUTH TWICE DAILY AS NEEDED FOR MUSCLE SPASM 60 tablet 5  . pantoprazole (PROTONIX) 40 MG tablet TAKE 1 TABLET BY  MOUTH TWICE DAILY 60 tablet 5  . RESTASIS 0.05 % ophthalmic emulsion INSTILL 1 DROP INTO EACH EYE TWICE DAILY  99  . simvastatin (ZOCOR) 40 MG tablet TAKE 1 TABLET BY MOUTH ONCE DAILY AT 6PM 90 tablet 2  . warfarin (COUMADIN) 5 MG tablet Take 1 tablet daily except 1 1/2 tablets on Fridays or AS DIRECTED BY ANTICOAGULATION CLINIC 105 tablet 1   No current facility-administered medications for this  visit.     Physical Examination  Vitals:   06/19/18 1106  BP: (!) 145/65  Pulse: 74  Resp: 16  Temp: 97.9 F (36.6 C)  TempSrc: Oral  SpO2: 96%  Weight: 160 lb (72.6 kg)  Height: 4\' 11"  (1.499 m)    Body mass index is 32.32 kg/m.  General:  Alert and oriented, no acute distress HEENT: Normal,  normocephalic Neck: No bruit or JVD Pulmonary: Clear to auscultation bilaterally Cardiac: Regular Rate and Rhythm without murmur Abdomen: Soft, non-tender, non-distended, no mass, no scars Skin: No rash Extremity Pulses:  2+ radial, right femoral I can not palpate the left femoral, non palpable dorsalis pedis, posterior tibial pulses bilaterally Musculoskeletal: No deformity or edema  Neurologic: Upper and lower extremity motor 5/5 and symmetric  DATA:   +-------+-----------+-----------+------------+------------+ ABI/TBIToday's ABIToday's TBIPrevious ABIPrevious TBI +-------+-----------+-----------+------------+------------+ Right  0.55       0.29       0.99        0.61         +-------+-----------+-----------+------------+------------+ Left   0.62       0.39       0.96        0.60         +-------+-----------+-----------+------------+------------+   ASSESSMENT:  right femoral to above-knee popliteal bypass by [name] in 2006. I revised this and extended to the below-knee popliteal artery and 2010. She has also previously had left femoral to above-knee popliteal bypass by [name] 2006.   Thrombolysis for a right femoral to below-knee popliteal bypass graft by  Dr. Oneida Alar in the past 2015.     PLAN:  She has no new symptoms, no open wounds or history of non healing wounds.  He ABI's have dropped in B LE I am scheduling her for angiogram and possible intervention.  She is on Coumadin for history of bypass thrombolysis.  She will hold her Coumadin until she has the procedure on Friday 06/22/2018.   Roxy Horseman PA-C [facility] [place] Office: [phone]   MD in [facility]

## 2018-06-21 ENCOUNTER — Telehealth: Payer: Self-pay | Admitting: *Deleted

## 2018-06-21 NOTE — Telephone Encounter (Signed)
Spoke with CANDICE at Dr. Ansel Bong office reg: fax for Coumadin clearance for procedure. Clearance sent x 3 without return. She will follow up.

## 2018-06-21 NOTE — Telephone Encounter (Signed)
The first time we received this paperwork was 06/21/2018- we are faxing it back today.   I review my paperwork daily and submit it back to practices. I was out sick on 25th and 26th but spoke with nursing staff and they had not seen this paperwork prior. Thanks for calling and faxing today- it was brought directly to [place] and we sent it back to you.   Coumadin was started by vascular it appears for PAD in 2015.  [name] [name]- I see Aggie Hacker listed DVT as one of her indications for treatment with coumadin- can you ask Laurence Slate, PA when this was? We have used our clinic to continue the coumadin under PAD with bypass thrombolysis per Dr. Oneida Alar prior notes. I want to validate the DVT history or delete it if needs to be removed. I did do a venous duplex study 08/07/14 which was negative for DVT - done for calf pain    From my review I cannot tell exactly when DVT was added to list or by whom.     I looked at notes from 06/10/11 with vascular and was not on coumadin at that time. DVT> was not listed in past medical history at that time  " Past Medical History   Diagnosis  Date   .  GERD (gastroesophageal reflux disease)    .  Hyperlipidemia    .  Hypertension    .  Stenosis of popliteal artery    .  History of colonic polyps    .  Diverticulosis    .  Shingles    .  Eczema    .  Allergic rhinoconjunctivitis    .  Lung nodule  2011   "  At her visit with vascular on 07/25/13 DVT also not listed " Past Medical History  Diagnosis Date  . GERD (gastroesophageal reflux disease)   . Hyperlipidemia   . History of colonic polyps   . Diverticulosis   . Shingles   . Eczema   . Allergic rhinoconjunctivitis   . Lung nodule 2011  . Environmental allergies     allergy shot- q friday in Dr. Janee Morn office. PFT's abnormal- recommended Spiriva to use preop & will d/c after surgery  . Arthritis     low back , stenosis  . Anxiety     pt. managed- uses deep breathing    . Stenosis of popliteal artery     blood clots in legs long ago    . COPD (chronic obstructive pulmonary disease)   . H/O hiatal hernia   . Hypertension   . Trigeminal neuralgia   . Peripheral vascular disease    "  My first visit was 03/13/2014 and I did not list DVT in past medical history  " Patient Active Problem List   Diagnosis Date Noted  . Aftercare following surgery of the circulatory system 02/20/2014  . Embolism and thrombosis of unspecified site 10/21/2013  . Encounter for therapeutic drug monitoring 10/21/2013  . PVD (peripheral vascular disease) 07/25/2013  . Syncope 12/29/2011  . Lumbar pain with radiation down right leg 09/13/2010  . Allergic rhinitis due to pollen 06/25/2010  . CHRONIC OBSTRUCTIVE PULMONARY DISEASE 01/22/2010  . PULMONARY NODULE, RIGHT LOWER LOBE 01/12/2010  . TOBACCO ABUSE, HX OF 01/11/2010  . IMPAIRED GLUCOSE TOLERANCE 01/06/2009  . OSTEOPOROSIS 01/06/2009  . HIP PAIN, LEFT 11/05/2008  . CONJUNCTIVITIS, ALLERGIC 08/04/2008  . POSTHERPETIC NEURALGIA 11/26/2007  . ECZEMA 11/26/2007  . PERIPHERAL VASCULAR DISEASE 06/29/2007  .  COLITIS 12/20/2006  . ANXIETY STATE NEC 12/04/2006  . DIVERTICULOSIS, COLON 12/04/2006  . HYPERLIPIDEMIA 10/23/2006  . HYPERTENSION 10/23/2006  . COLONIC POLYPS, RECURRENT 08/29/2006  . HIATAL HERNIA, HX OF 03/25/1999  . ESOPHAGEAL STRICTURE 08/09/1993   "

## 2018-06-22 ENCOUNTER — Telehealth: Payer: Self-pay | Admitting: Vascular Surgery

## 2018-06-22 ENCOUNTER — Other Ambulatory Visit: Payer: Self-pay

## 2018-06-22 ENCOUNTER — Ambulatory Visit (HOSPITAL_COMMUNITY)
Admission: RE | Admit: 2018-06-22 | Discharge: 2018-06-22 | Disposition: A | Discharge status: Home / Self Care | Payer: Medicare Other | Attending: Vascular Surgery | Admitting: Vascular Surgery

## 2018-06-22 ENCOUNTER — Encounter (HOSPITAL_COMMUNITY)
Admission: RE | Disposition: A | Discharge status: Home / Self Care | Payer: Self-pay | Source: Home / Self Care | Attending: Vascular Surgery

## 2018-06-22 DIAGNOSIS — I70213 Atherosclerosis of native arteries of extremities with intermittent claudication, bilateral legs: Secondary | ICD-10-CM | POA: Diagnosis not present

## 2018-06-22 DIAGNOSIS — E785 Hyperlipidemia, unspecified: Secondary | ICD-10-CM | POA: Diagnosis not present

## 2018-06-22 DIAGNOSIS — K219 Gastro-esophageal reflux disease without esophagitis: Secondary | ICD-10-CM | POA: Insufficient documentation

## 2018-06-22 DIAGNOSIS — Z91040 Latex allergy status: Secondary | ICD-10-CM | POA: Insufficient documentation

## 2018-06-22 DIAGNOSIS — Z7982 Long term (current) use of aspirin: Secondary | ICD-10-CM | POA: Insufficient documentation

## 2018-06-22 DIAGNOSIS — J449 Chronic obstructive pulmonary disease, unspecified: Secondary | ICD-10-CM | POA: Insufficient documentation

## 2018-06-22 DIAGNOSIS — Z7901 Long term (current) use of anticoagulants: Secondary | ICD-10-CM | POA: Diagnosis not present

## 2018-06-22 DIAGNOSIS — Z885 Allergy status to narcotic agent status: Secondary | ICD-10-CM | POA: Insufficient documentation

## 2018-06-22 DIAGNOSIS — Z79899 Other long term (current) drug therapy: Secondary | ICD-10-CM | POA: Diagnosis not present

## 2018-06-22 DIAGNOSIS — G500 Trigeminal neuralgia: Secondary | ICD-10-CM | POA: Insufficient documentation

## 2018-06-22 DIAGNOSIS — Z882 Allergy status to sulfonamides status: Secondary | ICD-10-CM | POA: Diagnosis not present

## 2018-06-22 DIAGNOSIS — Z87891 Personal history of nicotine dependence: Secondary | ICD-10-CM | POA: Insufficient documentation

## 2018-06-22 DIAGNOSIS — Z888 Allergy status to other drugs, medicaments and biological substances status: Secondary | ICD-10-CM | POA: Insufficient documentation

## 2018-06-22 DIAGNOSIS — Z9851 Tubal ligation status: Secondary | ICD-10-CM | POA: Insufficient documentation

## 2018-06-22 DIAGNOSIS — Z95820 Peripheral vascular angioplasty status with implants and grafts: Secondary | ICD-10-CM | POA: Diagnosis not present

## 2018-06-22 DIAGNOSIS — Z8249 Family history of ischemic heart disease and other diseases of the circulatory system: Secondary | ICD-10-CM | POA: Insufficient documentation

## 2018-06-22 DIAGNOSIS — Z86718 Personal history of other venous thrombosis and embolism: Secondary | ICD-10-CM | POA: Insufficient documentation

## 2018-06-22 DIAGNOSIS — I10 Essential (primary) hypertension: Secondary | ICD-10-CM | POA: Diagnosis not present

## 2018-06-22 DIAGNOSIS — M1990 Unspecified osteoarthritis, unspecified site: Secondary | ICD-10-CM | POA: Insufficient documentation

## 2018-06-22 DIAGNOSIS — I70211 Atherosclerosis of native arteries of extremities with intermittent claudication, right leg: Secondary | ICD-10-CM | POA: Diagnosis not present

## 2018-06-22 HISTORY — PX: ABDOMINAL AORTOGRAM W/LOWER EXTREMITY: CATH118223

## 2018-06-22 LAB — POCT I-STAT CREATININE: Creatinine, Ser: 1 mg/dL (ref 0.44–1.00)

## 2018-06-22 LAB — PROTIME-INR
INR: 1.2 (ref 0.8–1.2)
PROTHROMBIN TIME: 14.7 s (ref 11.4–15.2)

## 2018-06-22 LAB — POCT I-STAT 4, (NA,K, GLUC, HGB,HCT)
Glucose, Bld: 119 mg/dL — ABNORMAL HIGH (ref 70–99)
HCT: 35 % — ABNORMAL LOW (ref 36.0–46.0)
Hemoglobin: 11.9 g/dL — ABNORMAL LOW (ref 12.0–15.0)
Potassium: 4.7 mmol/L (ref 3.5–5.1)
Sodium: 138 mmol/L (ref 135–145)

## 2018-06-22 SURGERY — ABDOMINAL AORTOGRAM W/LOWER EXTREMITY
Anesthesia: LOCAL | Laterality: Bilateral

## 2018-06-22 MED ORDER — SODIUM CHLORIDE 0.9% FLUSH: 3.0000 mL | INTRAVENOUS | Status: DC | PRN

## 2018-06-22 MED ORDER — HEPARIN (PORCINE) IN NACL 1000-0.9 UT/500ML-% IV SOLN
INTRAVENOUS | Status: AC
  Filled 2018-06-22: qty 1000

## 2018-06-22 MED ORDER — OXYCODONE HCL 5 MG PO TABS: 5.0000 mg | ORAL_TABLET | ORAL | Status: DC | PRN

## 2018-06-22 MED ORDER — SODIUM CHLORIDE 0.9 % IV SOLN: INTRAVENOUS | Status: AC

## 2018-06-22 MED ORDER — LIDOCAINE HCL (PF) 1 % IJ SOLN
INTRAMUSCULAR | Status: DC | PRN
  Administered 2018-06-22: 15 mL via INTRADERMAL

## 2018-06-22 MED ORDER — ACETAMINOPHEN 325 MG PO TABS: 650.0000 mg | ORAL_TABLET | ORAL | Status: DC | PRN

## 2018-06-22 MED ORDER — ONDANSETRON HCL 4 MG/2ML IJ SOLN: 4.0000 mg | Freq: Four times a day (QID) | INTRAMUSCULAR | Status: DC | PRN

## 2018-06-22 MED ORDER — SODIUM CHLORIDE 0.9 % IV SOLN: 250.0000 mL | INTRAVENOUS | Status: DC | PRN

## 2018-06-22 MED ORDER — HYDRALAZINE HCL 20 MG/ML IJ SOLN: 5.0000 mg | INTRAMUSCULAR | Status: DC | PRN

## 2018-06-22 MED ORDER — SODIUM CHLORIDE 0.9% FLUSH: 3.0000 mL | Freq: Two times a day (BID) | INTRAVENOUS | Status: DC

## 2018-06-22 MED ORDER — HEPARIN (PORCINE) IN NACL 1000-0.9 UT/500ML-% IV SOLN
INTRAVENOUS | Status: DC | PRN
  Administered 2018-06-22 (×2): 500 mL

## 2018-06-22 MED ORDER — SODIUM CHLORIDE 0.9 % IV SOLN: INTRAVENOUS | Status: DC

## 2018-06-22 MED ORDER — IODIXANOL 320 MG/ML IV SOLN
INTRAVENOUS | Status: DC | PRN
  Administered 2018-06-22: 97 mL via INTRA_ARTERIAL

## 2018-06-22 MED ORDER — LIDOCAINE HCL (PF) 1 % IJ SOLN
INTRAMUSCULAR | Status: AC
  Filled 2018-06-22: qty 30

## 2018-06-22 MED ORDER — LABETALOL HCL 5 MG/ML IV SOLN: 10.0000 mg | INTRAVENOUS | Status: DC | PRN

## 2018-06-22 MED ORDER — MORPHINE SULFATE (PF) 10 MG/ML IV SOLN: 2.0000 mg | INTRAVENOUS | Status: DC | PRN

## 2018-06-22 SURGICAL SUPPLY — 10 items
CATH ANGIO 5F PIGTAIL 65CM (CATHETERS) ×1 IMPLANT
KIT PV (KITS) ×2 IMPLANT
SHEATH PINNACLE 5F 10CM (SHEATH) ×2 IMPLANT
SHEATH PROBE COVER 6X72 (BAG) ×1 IMPLANT
STOPCOCK MORSE 400PSI 3WAY (MISCELLANEOUS) ×1 IMPLANT
SYR MEDRAD MARK 7 150ML (SYRINGE) ×2 IMPLANT
TRANSDUCER W/STOPCOCK (MISCELLANEOUS) ×2 IMPLANT
TRAY PV CATH (CUSTOM PROCEDURE TRAY) ×2 IMPLANT
TUBING CIL FLEX 10 FLL-RA (TUBING) ×1 IMPLANT
WIRE HITORQ VERSACORE ST 145CM (WIRE) ×1 IMPLANT

## 2018-06-22 NOTE — Progress Notes (Signed)
Patient is to restart coumadin tomorrow 06/23/18 per Dr. Oneida Alar.

## 2018-06-22 NOTE — Progress Notes (Addendum)
Site area: Right groin a 5 french arterial sheathwas removed by Audry Riles RN  Site Prior to Removal:  Level 0  Pressure Applied For 20 MINUTES    Bedrest Beginning at  1240p  Manual:   Yes.    Patient Status During Pull:  stable  Post Pull Groin Site:  Level 0  Post Pull Instructions Given:  Yes.    Post Pull Pulses Present:  Yes.    Dressing Applied:  Yes.    Comments:  VS remain stable

## 2018-06-22 NOTE — Discharge Instructions (Signed)
Femoral Site Care °This sheet gives you information about how to care for yourself after your procedure. Your health care provider may also give you more specific instructions. If you have problems or questions, contact your health care provider. °What can I expect after the procedure? °After the procedure, it is common to have: °· Bruising that usually fades within 1-2 weeks. °· Tenderness at the site. °Follow these instructions at home: °Wound care °· Follow instructions from your health care provider about how to take care of your insertion site. Make sure you: °? Wash your hands with soap and water before you change your bandage (dressing). If soap and water are not available, use hand sanitizer. °? Change your dressing as told by your health care provider. °? Leave stitches (sutures), skin glue, or adhesive strips in place. These skin closures may need to stay in place for 2 weeks or longer. If adhesive strip edges start to loosen and curl up, you may trim the loose edges. Do not remove adhesive strips completely unless your health care provider tells you to do that. °· Do not take baths, swim, or use a hot tub until your health care provider approves. °· You may shower 24-48 hours after the procedure or as told by your health care provider. °? Gently wash the site with plain soap and water. °? Pat the area dry with a clean towel. °? Do not rub the site. This may cause bleeding. °· Do not apply powder or lotion to the site. Keep the site clean and dry. °· Check your femoral site every day for signs of infection. Check for: °? Redness, swelling, or pain. °? Fluid or blood. °? Warmth. °? Pus or a bad smell. °Activity °· For the first 2-3 days after your procedure, or as long as directed: °? Avoid climbing stairs as much as possible. °? Do not squat. °· Do not lift anything that is heavier than 10 lb (4.5 kg), or the limit that you are told, until your health care provider says that it is safe. °· Rest as  directed. °? Avoid sitting for a long time without moving. Get up to take short walks every 1-2 hours. °· Do not drive for 24 hours if you were given a medicine to help you relax (sedative). °General instructions °· Take over-the-counter and prescription medicines only as told by your health care provider. °· Keep all follow-up visits as told by your health care provider. This is important. °Contact a health care provider if you have: °· A fever or chills. °· You have redness, swelling, or pain around your insertion site. °Get help right away if: °· The catheter insertion area swells very fast. °· You pass out. °· You suddenly start to sweat or your skin gets clammy. °· The catheter insertion area is bleeding, and the bleeding does not stop when you hold steady pressure on the area. °· The area near or just beyond the catheter insertion site becomes pale, cool, tingly, or numb. °These symptoms may represent a serious problem that is an emergency. Do not wait to see if the symptoms will go away. Get medical help right away. Call your local emergency services (911 in the [place] Do not drive yourself to the hospital. °Summary °· After the procedure, it is common to have bruising that usually fades within 1-2 weeks. °· Check your femoral site every day for signs of infection. °· Do not lift anything that is heavier than 10 lb (4.5 kg), or the   limit that you are told, until your health care provider says that it is safe. °This information is not intended to replace advice given to you by your health care provider. Make sure you discuss any questions you have with your health care provider. °Document Released: 12/13/2013 Document Revised: 04/24/2017 Document Reviewed: 04/24/2017 °Elsevier Interactive Patient Education © 2019 [employer] Inc[employer] ° °Moderate Conscious Sedation, Adult, Care After °These instructions provide you with information about caring for yourself after your procedure. Your health care provider may also give  you more specific instructions. Your treatment has been planned according to current medical practices, but problems sometimes occur. Call your health care provider if you have any problems or questions after your procedure. °What can I expect after the procedure? °After your procedure, it is common: °· To feel sleepy for several hours. °· To feel clumsy and have poor balance for several hours. °· To have poor judgment for several hours. °· To vomit if you eat too soon. °Follow these instructions at home: °For at least 24 hours after the procedure: ° °· Do not: °? Participate in activities where you could fall or become injured. °? Drive. °? Use heavy machinery. °? Drink alcohol. °? Take sleeping pills or medicines that cause drowsiness. °? Make important decisions or sign legal documents. °? Take care of children on your own. °· Rest. °Eating and drinking °· Follow the diet recommended by your health care provider. °· If you vomit: °? Drink water, juice, or soup when you can drink without vomiting. °? Make sure you have little or no nausea before eating solid foods. °General instructions °· Have a responsible adult stay with you until you are awake and alert. °· Take over-the-counter and prescription medicines only as told by your health care provider. °· If you smoke, do not smoke without supervision. °· Keep all follow-up visits as told by your health care provider. This is important. °Contact a health care provider if: °· You keep feeling nauseous or you keep vomiting. °· You feel light-headed. °· You develop a rash. °· You have a fever. °Get help right away if: °· You have trouble breathing. °This information is not intended to replace advice given to you by your health care provider. Make sure you discuss any questions you have with your health care provider. °Document Released: 01/30/2013 Document Revised: 09/14/2015 Document Reviewed: 08/01/2015 °Elsevier Interactive Patient Education © 2019 Elsevier  Inc. ° °

## 2018-06-22 NOTE — Op Note (Signed)
Procedure: Ultrasound right groin, abdominal aortogram with bilateral lower extremity runoff  Preoperative diagnosis: Claudication  Postoperative diagnosis: Same  Anesthesia: Local  Operative findings: #1 mid graft stenosis right leg 90% three-vessel runoff  2.  70% stenosis left mid common femoral artery with patent left leg bypass three-vessel runoff  Operative details: After obtaining form consent, the patient was taken the PV lab.  The patient was placed in supine position Angie table.  Both groins were prepped and draped in usual sterile fashion.  Local anesthesia was infiltrated of the right common femoral artery.  Ultrasound was used to identify the right common femoral artery and femoral bifurcation.  The vessel was patent.  Image was obtained for the patient's record.  Next introducer needle was used to cannulate the right common femoral artery and an 035 versa core wire threaded up the abdominal aorta under fluoroscopic guidance.  Next a 5 French sheath was placed over the guidewire in the right common femoral artery.  This was thoroughly flushed with heparinized saline.  5 French pigtail catheter was then advanced up into the abdominal aorta and abdominal aortogram was obtained in AP projection.  Left and right renal arteries are patent.  The infrarenal abdominal aorta left common external and internal iliac arteries are all patent but heavily calcified.  Next the pigtail catheter is pulled dentist above the aortic bifurcation and lower extremity runoff views were obtained.  In the left lower extremity, the left common femoral artery has a 70% stenosis in its midportion over the femoral head.  The profunda is patent.  The left leg bypass is widely patent.  There is three-vessel runoff to the left foot.  In the right lower extremity the right common femoral profundus is widely patent.  There is a bypass originating from right common femoral artery down to the above-knee popliteal artery  which is patent but there is a 90% stenosis in its midportion.  This is over about a centimeter length.  This point pigtail catheter was removed over guidewire.  5 French sheath was left in place to be pulled the holding area.  Patient tolerated procedure well there were no complications.  Operative management: Patient was offered a left common femoral endarterectomy with angioplasty of the right mid graft stenosis at the same time in the operating room at an elective procedure.  She wishes to think about this for several weeks.  We will set her up for a follow-up visit with me in 1 month to discuss further.  [name], MD [facility] [place] Office: [phone] Pager: [phone]

## 2018-06-22 NOTE — Telephone Encounter (Signed)
-----   Message from Elam Dutch, MD sent at 06/22/2018 11:22 AM EST ----- [place] groin Aortogram with bilat runoff  She needs a follow up with me with repeat ABIs in one month to discuss options  [name], MD [facility] [place]: [phone] Pager: [phone]

## 2018-06-22 NOTE — Interval H&P Note (Signed)
History and Physical Interval Note:  06/22/2018 9:29 AM  [name] [name]  has presented today for surgery, with the diagnosis of pad  The various methods of treatment have been discussed with the patient and family. After consideration of risks, benefits and other options for treatment, the patient has consented to  Procedure(s): ABDOMINAL AORTOGRAM W/LOWER EXTREMITY (Bilateral) as a surgical intervention .  The patient's history has been reviewed, patient examined, no change in status, stable for surgery.  I have reviewed the patient's chart and labs.  Questions were answered to the patient's satisfaction.     [name]

## 2018-06-22 NOTE — Telephone Encounter (Signed)
sch appt lvm mld ltr 07/26/2018 2pm ABI 3pm p/o MD

## 2018-06-22 NOTE — Interval H&P Note (Signed)
History and Physical Interval Note:  06/22/2018 10:43 AM  [name] [name]  has presented today for surgery, with the diagnosis of pad  The various methods of treatment have been discussed with the patient and family. After consideration of risks, benefits and other options for treatment, the patient has consented to  Procedure(s): ABDOMINAL AORTOGRAM W/LOWER EXTREMITY (Bilateral) as a surgical intervention .  The patient's history has been reviewed, patient examined, no change in status, stable for surgery.  I have reviewed the patient's chart and labs.  Questions were answered to the patient's satisfaction.     [name]

## 2018-06-25 ENCOUNTER — Encounter (HOSPITAL_COMMUNITY): Payer: Self-pay | Admitting: Vascular Surgery

## 2018-06-27 ENCOUNTER — Encounter: Payer: Medicare Other | Attending: Physical Medicine & Rehabilitation | Admitting: Physical Medicine & Rehabilitation

## 2018-06-27 ENCOUNTER — Encounter: Payer: Self-pay | Admitting: Physical Medicine & Rehabilitation

## 2018-06-27 VITALS — BP 123/62 | HR 71 | Ht 59.0 in | Wt 164.0 lb

## 2018-06-27 DIAGNOSIS — G479 Sleep disorder, unspecified: Secondary | ICD-10-CM | POA: Insufficient documentation

## 2018-06-27 DIAGNOSIS — M533 Sacrococcygeal disorders, not elsewhere classified: Secondary | ICD-10-CM | POA: Insufficient documentation

## 2018-06-27 DIAGNOSIS — I779 Disorder of arteries and arterioles, unspecified: Secondary | ICD-10-CM

## 2018-06-27 DIAGNOSIS — M25571 Pain in right ankle and joints of right foot: Secondary | ICD-10-CM | POA: Insufficient documentation

## 2018-06-27 DIAGNOSIS — M961 Postlaminectomy syndrome, not elsewhere classified: Secondary | ICD-10-CM | POA: Diagnosis not present

## 2018-06-27 DIAGNOSIS — M792 Neuralgia and neuritis, unspecified: Secondary | ICD-10-CM | POA: Insufficient documentation

## 2018-06-27 DIAGNOSIS — M791 Myalgia, unspecified site: Secondary | ICD-10-CM | POA: Diagnosis not present

## 2018-06-27 DIAGNOSIS — M25561 Pain in right knee: Secondary | ICD-10-CM | POA: Diagnosis not present

## 2018-06-27 DIAGNOSIS — G8929 Other chronic pain: Secondary | ICD-10-CM | POA: Insufficient documentation

## 2018-06-27 DIAGNOSIS — G894 Chronic pain syndrome: Secondary | ICD-10-CM | POA: Diagnosis not present

## 2018-06-27 DIAGNOSIS — R269 Unspecified abnormalities of gait and mobility: Secondary | ICD-10-CM | POA: Diagnosis not present

## 2018-06-27 NOTE — Progress Notes (Addendum)
Subjective:    Patient ID: [name] [name], female    DOB: 1925/02/26, 85 y.o.   MRN: [id number]  HPI Female with pmhpsh of PAD, COPD, DVT, HTN, Gout, trigeminal neuralgia, post herpetic neuralgia, OA of right knee with injections, lumbar fusion present for follow up of pain in her back > right leg pain.   Initially stated: Pt stated she has had back pain "all her life".  Getting progressively worse.  Movement improves the pain.  Standing and walking exacerbate the pain. Dull pain.  Non-radiating.  Intermittent.  Denies associated numbness, tingling, weakness.  Lidoderm patch help. Pain limits pt from doing things around the house.   Last clinic visit 01/06/19.  Since last visit, pt had aortagram, notes reviewed.  She is contemplating LE bypass grafting. She states she fell since last visit, trying to hold pizzas and medicine and open the door at the same time.  She had xrays with some internal bruising. Notes from PCP reviewed regarding rib pain and xrays- unremarkable.  She is using Lidoderm patches.  She stopped using Robaxin. She is doing HEP. Sleep has improved. Knee pain and ankle pain has improved. Mainly with pain in right lower back.  Pain Inventory Average Pain 5 Pain Right Now 6 My pain is aching  In the last 24 hours, has pain interfered with the following? General activity 5 Relation with others 0 Enjoyment of life 0 What TIME of day is your pain at its worst? daytime and night,  Sleep (in general) Fair  Pain is worse with: walking, bending and standing Pain improves with: nothing Relief from Meds: 0  Mobility walk without assistance use a cane how many minutes can you walk? 2-3 ability to climb steps?  yes do you drive?  yes Do you have any goals in this area?  yes  Function retired I need assistance with the following:  household duties  Neuro/Psych bladder control problems trouble walking confusion  Prior Studies Any changes since last visit?   no  Physicians involved in your care Any changes since last visit?  no   Family History  Problem Relation Age of Onset  . Arthritis Mother   . Hypertension Mother   . Heart disease Father   . Colon polyps Father   . Breast cancer Sister   . Breast cancer Daughter   . Hypertension Son   . Lung cancer Brother   . Colon cancer Sister   . Brain cancer Sister   . Lung cancer Sister   . Anesthesia problems Neg Hx   . Hypotension Neg Hx   . Malignant hyperthermia Neg Hx   . Pseudochol deficiency Neg Hx    Social History   Socioeconomic History  . Marital status: Single    Spouse name: Not on file  . Number of children: 4  . Years of education: 75  . Highest education level: Not on file  Occupational History  . Occupation: retired    Fish farm manager: RETIRED  Social Needs  . Financial resource strain: Not on file  . Food insecurity:    Worry: Not on file    Inability: Not on file  . Transportation needs:    Medical: Not on file    Non-medical: Not on file  Tobacco Use  . Smoking status: Former Smoker    Packs/day: 2.00    Years: 30.00    Pack years: 60.00    Types: Cigarettes    Last attempt to quit: 06/18/1993  Years since quitting: 25.0  . Smokeless tobacco: Never Used  . Tobacco comment: QUIT IN 1995  Substance and Sexual Activity  . Alcohol use: No    Alcohol/week: 0.0 standard drinks  . Drug use: No  . Sexual activity: Not on file  Lifestyle  . Physical activity:    Days per week: Not on file    Minutes per session: Not on file  . Stress: Not on file  Relationships  . Social connections:    Talks on phone: Not on file    Gets together: Not on file    Attends religious service: Not on file    Active member of club or organization: Not on file    Attends meetings of clubs or organizations: Not on file    Relationship status: Not on file  Other Topics Concern  . Not on file  Social History Narrative   Patient is widowed with 4 children. 2 grandkids. Lives  alone.    Patient is right handed.   Patient has high school education.   Patient drinks 1 cup daily.      Retired from [employer] for 28 years, works 2 days a week until 2016.       Hobbies: volunteers at [employer], [employer] from church visits people.       Past Surgical History:  Procedure Laterality Date  . ABDOMINAL AORTAGRAM N/A 06/17/2011   Procedure: ABDOMINAL Maxcine Ham;  Surgeon: Elam Dutch, MD;  Location: [facility] CATH LAB;  Service: Cardiovascular;  Laterality: N/A;  . ABDOMINAL AORTOGRAM W/LOWER EXTREMITY Bilateral 06/22/2018   Procedure: ABDOMINAL AORTOGRAM W/LOWER EXTREMITY;  Surgeon: Elam Dutch, MD;  Location: [facility] CV LAB;  Service: Cardiovascular;  Laterality: Bilateral;  . CATARACT EXTRACTION, BILATERAL     w IOL  . CHOLECYSTECTOMY  1998  . FEMORAL-POPLITEAL BYPASS GRAFT        x2 surgeries 1990's & 2009  . INJECTION KNEE Right Aug. 2016   Gel injection for pain  . LUMBAR FUSION  07/06/2011  . TONSILLECTOMY     as a teenager   . TUBAL LIGATION     Past Medical History:  Diagnosis Date  . Allergic rhinoconjunctivitis   . Anxiety    pt. managed- uses deep breathing   . Arthritis    low back , stenosis  . COLONIC POLYPS, RECURRENT 08/29/2006   2008 last colonoscopy. No further colonoscopy.     Marland Kitchen COPD (chronic obstructive pulmonary disease) ([place])   . Diverticulosis   . DVT (deep venous thrombosis) ([place])   . Eczema   . Environmental allergies    allergy shot- q friday in Dr. Janee Morn office. PFT's abnormal- recommended Spiriva to use preop & will d/c after surgery  . GERD (gastroesophageal reflux disease)   . Hiatal hernia   . Hyperlipidemia   . Hypertension   . Lung nodule 2011  . Peripheral vascular disease ([place])   . Shingles   . Stenosis of popliteal artery (HCC)    blood clots in legs long ago    . Trigeminal neuralgia    BP 123/62   Pulse 71   Ht 4\' 11"  (1.499 m)   Wt 164 lb (74.4 kg)   SpO2 95%   BMI 33.12 kg/m    Opioid Risk Score:   Fall Risk Score:  `1  Depression screen PHQ 2/9  Depression screen [facility] 2/9 05/17/2018 01/05/2018 11/01/2017 02/22/2017 10/31/2016 06/16/2016 05/05/2016  Decreased Interest 0 0 0 0 0 0 0  Down, Depressed, Hopeless 0 0 0 0 0 0 0  PHQ - 2 Score 0 0 0 0 0 0 0  Altered sleeping - - 0 - - - 1  Tired, decreased energy - - 0 - - - 0  Change in appetite - - 0 - - - 1  Feeling bad or failure about yourself  - - 0 - - - 0  Trouble concentrating - - 0 - - - 0  Moving slowly or fidgety/restless - - 0 - - - 0  Suicidal thoughts - - 0 - - - 0  PHQ-9 Score - - 0 - - - 2  Difficult doing work/chores - - Not difficult at all - - - -  Some recent data might be hidden   Review of Systems  HENT: Negative.   Eyes: Negative.   Respiratory: Negative.   Cardiovascular: Negative.   Gastrointestinal: Negative.        Left flank pain  Endocrine: Negative.        High blood pressure   Genitourinary: Negative.   Musculoskeletal: Positive for arthralgias, back pain, gait problem and myalgias.  Skin: Negative.   Allergic/Immunologic: Negative.   Hematological: Negative.   Psychiatric/Behavioral: Negative.   All other systems reviewed and are negative.     Objective:   Physical Exam Gen: NAD. Vital signs reviewed HENT: Normocephalic, Atraumatic Eyes: EOMI. No discharge. Cardio: RRR. No JVD. Pulm: B/l clear to auscultation.  Effort normal Abd: Nondistended, BS+ MSK:  Gait mildly antalgic.   +right fortin finger's Neuro: Alert  Strength  5/5 grossly throughout Skin: Intact. Warm and dry.    Assessment & Plan:  Female with pmhpsh of PAD, COPD, DVT, HTN, Gout, trigeminal neuralgia, post herpetic neuralgia, OA of right knee with injections, lumbar fusion present for follow up of pain in her back > right leg pain.    1. Chronic low back pain with failed back syndrome   Xray of L-spine and right SI joint reviewed, SI joint unremarkable, degenerative changes in spine with right  scoliosis.    Will avoid NSAIDs due to coumadin  Stopped using Robaxin due to nausea  Unable to tolerate Gabapentin  Cont heat  Cont tylenol, limit to 2000mg /day  Cont Lidoderm patches  Cont HEP, completed PT  Cont TENS unit PRN  Cont Cymbalta to 60 mg  2. Possible Sacroiliitis on right  Xray unremakrable  Minimal benefit with bracing   3. Sleep disturbance  Significantly improved  Cont melatonin  4. Neuropathic pain  See #1  5. Gait abnormality  Improved  Cont cane   Cont HEP  6. Myalgia  Will schedule for trigger point injection  7. Right knee pain  MRI reviewed from 2016, showing meniscal/cartilage damage  Receiving injections from Ortho, cont  Pensaid denied  Cont voltaren gel  Cont lidoderm on knee  Improved  8. Right ankle pain - likely gout flare  Lidoderm patches, pt using Voltaren gel  Xrays reviewed, relatively uremarkable  Uric acid elevated  Improved

## 2018-06-28 ENCOUNTER — Encounter: Payer: Medicare Other | Admitting: Physical Medicine & Rehabilitation

## 2018-06-29 ENCOUNTER — Ambulatory Visit (INDEPENDENT_AMBULATORY_CARE_PROVIDER_SITE_OTHER): Payer: Medicare Other | Admitting: General Practice

## 2018-06-29 DIAGNOSIS — Z7901 Long term (current) use of anticoagulants: Secondary | ICD-10-CM | POA: Diagnosis not present

## 2018-06-29 LAB — POCT INR: INR: 1.5 — AB (ref 2.0–3.0)

## 2018-06-29 NOTE — Patient Instructions (Addendum)
Pre visit review using our clinic review tool, if applicable. No additional management support is needed unless otherwise documented below in the visit note.  Take 2 tablets today and tomorrow (3/6 and 3/7) and then change dosage to 1 tablet daily except 1 1/2 tablets on Monday/Wed/Fridays.  Re-check in 2 weeks.

## 2018-07-01 ENCOUNTER — Other Ambulatory Visit: Payer: Self-pay | Admitting: Physical Medicine & Rehabilitation

## 2018-07-04 ENCOUNTER — Encounter: Payer: Medicare Other | Admitting: Physical Medicine & Rehabilitation

## 2018-07-04 ENCOUNTER — Ambulatory Visit: Payer: Medicare Other | Admitting: Physical Medicine & Rehabilitation

## 2018-07-13 ENCOUNTER — Other Ambulatory Visit: Payer: Self-pay

## 2018-07-13 ENCOUNTER — Ambulatory Visit (INDEPENDENT_AMBULATORY_CARE_PROVIDER_SITE_OTHER): Payer: Medicare Other | Admitting: General Practice

## 2018-07-13 DIAGNOSIS — Z7901 Long term (current) use of anticoagulants: Secondary | ICD-10-CM | POA: Diagnosis not present

## 2018-07-13 LAB — POCT INR: INR: 3.7 — AB (ref 2.0–3.0)

## 2018-07-13 NOTE — Patient Instructions (Addendum)
Pre visit review using our clinic review tool, if applicable. No additional management support is needed unless otherwise documented below in the visit note.  Take 1 tablet today (5 mg) and then change dosage and take 1 tablet daily except 1 1/2 tablets on Mondays and Fridays.  Re-check in 4 weeks.

## 2018-07-26 ENCOUNTER — Encounter: Payer: Medicare Other | Admitting: Vascular Surgery

## 2018-07-26 ENCOUNTER — Encounter (HOSPITAL_COMMUNITY): Payer: Medicare Other

## 2018-07-27 ENCOUNTER — Other Ambulatory Visit: Payer: Self-pay | Admitting: Physical Medicine & Rehabilitation

## 2018-07-30 ENCOUNTER — Telehealth: Payer: Self-pay | Admitting: Physical Medicine & Rehabilitation

## 2018-07-30 NOTE — Telephone Encounter (Signed)
Given patient's risk factors for COVID, I would advise we hold off on injections unless her pain is difficult to tolerate and she is having difficulty managing at home.  If this is the case, she may still benefit from injections 2 weeks apart, however, as mentioned, if she is able to manage in her current situation, I would hold off on injections until COVID risk has decreased.

## 2018-07-30 NOTE — Telephone Encounter (Signed)
Patients daughter would like a call back in reference to her mothers appointment this week.  She is scheduled to have TP injections Wed then again in 2 weeks.  Daughter is concerned that if she can't get both injections--would it be a waste of time for to have the first injection since she will not be able to get the second round.  Please call daughter back.

## 2018-07-31 ENCOUNTER — Telehealth: Payer: Self-pay | Admitting: *Deleted

## 2018-07-31 NOTE — Telephone Encounter (Signed)
[name] [name] called and because her TP have been put off until 08/30/18, she is asking if there is something for pain she could be given until then.  Please advise.

## 2018-07-31 NOTE — Telephone Encounter (Signed)
Can we schedule an appointment.  Thanks.

## 2018-08-01 ENCOUNTER — Encounter: Payer: Medicare Other | Admitting: Physical Medicine & Rehabilitation

## 2018-08-02 ENCOUNTER — Telehealth: Payer: Self-pay | Admitting: *Deleted

## 2018-08-02 NOTE — Telephone Encounter (Signed)
I could not reach [name] [name] by her home number (fast busy signal) so I left her a message on her mobile number that we will call her Monday to schedule an appt with Dr Posey Pronto to discuss her options while she waits on her procedure in May.

## 2018-08-08 ENCOUNTER — Encounter: Payer: Self-pay | Admitting: Physical Medicine & Rehabilitation

## 2018-08-08 ENCOUNTER — Encounter: Payer: Medicare Other | Attending: Physical Medicine & Rehabilitation | Admitting: Physical Medicine & Rehabilitation

## 2018-08-08 ENCOUNTER — Ambulatory Visit: Payer: Medicare Other | Admitting: Physical Medicine & Rehabilitation

## 2018-08-08 ENCOUNTER — Other Ambulatory Visit: Payer: Self-pay

## 2018-08-08 DIAGNOSIS — R269 Unspecified abnormalities of gait and mobility: Secondary | ICD-10-CM

## 2018-08-08 DIAGNOSIS — M792 Neuralgia and neuritis, unspecified: Secondary | ICD-10-CM

## 2018-08-08 DIAGNOSIS — M25561 Pain in right knee: Secondary | ICD-10-CM

## 2018-08-08 DIAGNOSIS — G479 Sleep disorder, unspecified: Secondary | ICD-10-CM

## 2018-08-08 DIAGNOSIS — G894 Chronic pain syndrome: Secondary | ICD-10-CM

## 2018-08-08 DIAGNOSIS — M533 Sacrococcygeal disorders, not elsewhere classified: Secondary | ICD-10-CM

## 2018-08-08 DIAGNOSIS — M25571 Pain in right ankle and joints of right foot: Secondary | ICD-10-CM

## 2018-08-08 DIAGNOSIS — M961 Postlaminectomy syndrome, not elsewhere classified: Secondary | ICD-10-CM

## 2018-08-08 DIAGNOSIS — M791 Myalgia, unspecified site: Secondary | ICD-10-CM | POA: Insufficient documentation

## 2018-08-08 DIAGNOSIS — G8929 Other chronic pain: Secondary | ICD-10-CM

## 2018-08-08 MED ORDER — BACLOFEN 10 MG PO TABS: 10.0000 mg | ORAL_TABLET | Freq: Two times a day (BID) | ORAL | 0 refills | Status: DC

## 2018-08-08 NOTE — Progress Notes (Signed)
Subjective:    Patient ID: [name] [name], female    DOB: 06-13-1924, 85 y.o.   MRN: [id number]  TELEHEALTH NOTE  Due to national recommendations of social distancing due to COVID 19, an audio/video telehealth visit is felt to be most appropriate for this patient at this time.  See Chart message from today for the patient's consent to telehealth from [place].     I verified that I am speaking with the correct person using two identifiers.  Location of patient: Home Location of provider: Office Method of communication: Telephone Names of participants: Zorita Pang scheduling, [employer] obtaining consent and vitals if available Established patient Time spent on call: 15 minutes  HPI  Female with pmhpsh of PAD, COPD, DVT, HTN, Gout, trigeminal neuralgia, post herpetic neuralgia, OA of right knee with injections, lumbar fusion present for follow up of pain in her back > right leg pain.   Initially stated: Pt stated she has had back pain "all her life".  Getting progressively worse.  Movement improves the pain.  Standing and walking exacerbate the pain. Dull pain.  Non-radiating.  Intermittent.  Denies associated numbness, tingling, weakness.  Lidoderm patch help. Pain limits pt from doing things around the house.   Last clinic visit 06/27/18.  Since last visit, significant coordination occurred regarding pain and potential interventions.  Patient states, her pain is intermittent, worse with rain. She states it only occurs when she is on her feet.  Pain is in back. She ambulates and has to rest for a little bit and is able to ambulate again.  Denies falls.  Pain is mainly right lower back.  She states she has been calling for meds. She states her daughter has been calling to inquire about the medication injection. She has limited benefit with Lidoderm patch.  She is using TENS unit.  Benefit with Voltaren gel.  Pain Inventory Average Pain 8 Pain Right  Now 4 My pain is aching and weather affected, today is a better day  In the last 24 hours, has pain interfered with the following? General activity 5 Relation with others 0 Enjoyment of life 0 What TIME of day is your pain at its worst? daytime and night / when up on feet,  Sleep (in general) Good  Pain is worse with: walking, bending, standing, some activites and weight bearing Pain improves with: rest, pacing activities and laying down  Relief from Meds: 10  Mobility walk without assistance use a cane how many minutes can you walk? 2-3 ability to climb steps?  yes do you drive?  yes Do you have any goals in this area?  yes  Function retired  Neuro/Psych bladder control problems trouble walking  Prior Studies Any changes since last visit?  no  Physicians involved in your care Any changes since last visit?  no   Family History  Problem Relation Age of Onset  . Arthritis Mother   . Hypertension Mother   . Heart disease Father   . Colon polyps Father   . Breast cancer Sister   . Breast cancer Daughter   . Hypertension Son   . Lung cancer Brother   . Colon cancer Sister   . Brain cancer Sister   . Lung cancer Sister   . Anesthesia problems Neg Hx   . Hypotension Neg Hx   . Malignant hyperthermia Neg Hx   . Pseudochol deficiency Neg Hx    Social History   Socioeconomic History  .  Marital status: Single    Spouse name: Not on file  . Number of children: 4  . Years of education: 34  . Highest education level: Not on file  Occupational History  . Occupation: retired    Fish farm manager: RETIRED  Social Needs  . Financial resource strain: Not on file  . Food insecurity:    Worry: Not on file    Inability: Not on file  . Transportation needs:    Medical: Not on file    Non-medical: Not on file  Tobacco Use  . Smoking status: Former Smoker    Packs/day: 2.00    Years: 30.00    Pack years: 60.00    Types: Cigarettes    Last attempt to quit: 06/18/1993     Years since quitting: 25.1  . Smokeless tobacco: Never Used  . Tobacco comment: QUIT IN 1995  Substance and Sexual Activity  . Alcohol use: No    Alcohol/week: 0.0 standard drinks  . Drug use: No  . Sexual activity: Not on file  Lifestyle  . Physical activity:    Days per week: Not on file    Minutes per session: Not on file  . Stress: Not on file  Relationships  . Social connections:    Talks on phone: Not on file    Gets together: Not on file    Attends religious service: Not on file    Active member of club or organization: Not on file    Attends meetings of clubs or organizations: Not on file    Relationship status: Not on file  Other Topics Concern  . Not on file  Social History Narrative   Patient is widowed with 4 children. 2 grandkids. Lives alone.    Patient is right handed.   Patient has high school education.   Patient drinks 1 cup daily.      Retired from [employer] for 28 years, works 2 days a week until 2016.       Hobbies: volunteers at [employer], [employer] from church visits people.       Past Surgical History:  Procedure Laterality Date  . ABDOMINAL AORTAGRAM N/A 06/17/2011   Procedure: ABDOMINAL Maxcine Ham;  Surgeon: Elam Dutch, MD;  Location: [facility] CATH LAB;  Service: Cardiovascular;  Laterality: N/A;  . ABDOMINAL AORTOGRAM W/LOWER EXTREMITY Bilateral 06/22/2018   Procedure: ABDOMINAL AORTOGRAM W/LOWER EXTREMITY;  Surgeon: Elam Dutch, MD;  Location: [place] CV LAB;  Service: Cardiovascular;  Laterality: Bilateral;  . CATARACT EXTRACTION, BILATERAL     w IOL  . CHOLECYSTECTOMY  1998  . FEMORAL-POPLITEAL BYPASS GRAFT        x2 surgeries 1990's & 2009  . INJECTION KNEE Right Aug. 2016   Gel injection for pain  . LUMBAR FUSION  07/06/2011  . TONSILLECTOMY     as a teenager   . TUBAL LIGATION     Past Medical History:  Diagnosis Date  . Allergic rhinoconjunctivitis   . Anxiety    pt. managed- uses deep breathing   .  Arthritis    low back , stenosis  . COLONIC POLYPS, RECURRENT 08/29/2006   2008 last colonoscopy. No further colonoscopy.     Marland Kitchen COPD (chronic obstructive pulmonary disease) ([place])   . Diverticulosis   . DVT (deep venous thrombosis) ([place])   . Eczema   . Environmental allergies    allergy shot- q friday in Dr. Janee Morn office. PFT's abnormal- recommended Spiriva to use preop & will d/c  after surgery  . GERD (gastroesophageal reflux disease)   . Hiatal hernia   . Hyperlipidemia   . Hypertension   . Lung nodule 2011  . Peripheral vascular disease ([place])   . Shingles   . Stenosis of popliteal artery (HCC)    blood clots in legs long ago    . Trigeminal neuralgia    There were no vitals taken for this visit.  Opioid Risk Score:   Fall Risk Score:  `1  Depression screen PHQ 2/9  Depression screen [facility] 2/9 08/08/2018 05/17/2018 01/05/2018 11/01/2017 02/22/2017 10/31/2016 06/16/2016  Decreased Interest 0 0 0 0 0 0 0  Down, Depressed, Hopeless 0 0 0 0 0 0 0  PHQ - 2 Score 0 0 0 0 0 0 0  Altered sleeping - - - 0 - - -  Tired, decreased energy - - - 0 - - -  Change in appetite - - - 0 - - -  Feeling bad or failure about yourself  - - - 0 - - -  Trouble concentrating - - - 0 - - -  Moving slowly or fidgety/restless - - - 0 - - -  Suicidal thoughts - - - 0 - - -  PHQ-9 Score - - - 0 - - -  Difficult doing work/chores - - - Not difficult at all - - -  Some recent data might be hidden   Review of Systems  HENT: Negative.   Eyes: Negative.   Respiratory: Negative.   Cardiovascular: Negative.   Gastrointestinal: Negative.        Left flank pain  Endocrine: Negative.        High blood pressure   Genitourinary: Negative.   Musculoskeletal: Positive for arthralgias, back pain, gait problem and myalgias.  Skin: Negative.   Allergic/Immunologic: Negative.   Hematological: Negative.   Psychiatric/Behavioral: Negative.   All other systems reviewed and are negative.     Objective:   Physical  Exam Gen: NAD. Pulm: Effort normal Neuro: Alert and oriented    Assessment & Plan:  Female with pmhpsh of PAD, COPD, DVT, HTN, Gout, trigeminal neuralgia, post herpetic neuralgia, OA of right knee with injections, lumbar fusion present for follow up of pain in her back > right leg pain.    1. Chronic low back pain with failed back syndrome   Xray of L-spine and right SI joint reviewed, SI joint unremarkable, degenerative changes in spine with right scoliosis.    Will avoid NSAIDs due to coumadin  Stopped using Robaxin due to nausea  Unable to tolerate Gabapentin  Cont heat  Cont tylenol, limit to 2000mg /day  Cont Lidoderm patches  Cont HEP, completed PT  Cont TENS unit PRN  Cont Cymbalta to 60 mg  Will order Baclofen 5 BID, may increase to 10 BID if no side effects  2. Possible Sacroiliitis on right  Xray unremakrable  Minimal benefit with bracing   3. Sleep disturbance  Significantly improved  Cont melatonin  4. Neuropathic pain  See #1  5. Gait abnormality  Improved  Cont cane   Cont HEP  6. Myalgia  Will postpone trigger point injection after May  7. Right knee pain  MRI reviewed from 2016, showing meniscal/cartilage damage  Receiving injections from Ortho, cont  Pensaid denied  Cont voltaren gel  Cont lidoderm on knee  Improved  8. Right ankle pain - likely gout flare  Lidoderm patches, pt using Voltaren gel  Xrays reviewed, relatively uremarkable  Uric acid  elevated  Improved

## 2018-08-09 ENCOUNTER — Telehealth (INDEPENDENT_AMBULATORY_CARE_PROVIDER_SITE_OTHER): Payer: Self-pay | Admitting: Orthopaedic Surgery

## 2018-08-09 NOTE — Telephone Encounter (Signed)
Patient called requesting to get another Synvisc One Injection.  CB#[phone].  Thank you.

## 2018-08-09 NOTE — Telephone Encounter (Signed)
Please submit gel inj

## 2018-08-10 ENCOUNTER — Ambulatory Visit (INDEPENDENT_AMBULATORY_CARE_PROVIDER_SITE_OTHER): Payer: Medicare Other | Admitting: General Practice

## 2018-08-10 DIAGNOSIS — Z7901 Long term (current) use of anticoagulants: Secondary | ICD-10-CM | POA: Diagnosis not present

## 2018-08-10 LAB — POCT INR: INR: 2.9 (ref 2.0–3.0)

## 2018-08-10 NOTE — Patient Instructions (Signed)
Pre visit review using our clinic review tool, if applicable. No additional management support is needed unless otherwise documented below in the visit note. 

## 2018-08-13 ENCOUNTER — Encounter: Payer: Self-pay | Admitting: Family Medicine

## 2018-08-13 ENCOUNTER — Telehealth: Payer: Self-pay | Admitting: Family Medicine

## 2018-08-13 NOTE — Telephone Encounter (Signed)
Updating record to remove DVT from past medical history after conversation with vascular surgery- greatly appreciate their help.

## 2018-08-13 NOTE — Telephone Encounter (Signed)
-----   Message from Ulyses Amor, [place] sent at 08/13/2018 11:41 AM EDT ----- Her past vascular history is significant for the fact that she underwent thrombolysis for a right femoral to below-knee popliteal bypass graft in June 2015. There was no significant stenosis noted after the thrombolysis. She was placed on Coumadin at discharge.    I think you are correct the diagnosis of DVT should be removed without evidence and the Coumadin was started for arterial thrombosis not DVT.  Thank you for your time.  Roxy Horseman PA-C     ----- Message ----- From: Marin Olp, MD Sent: 07/02/2018   8:48 PM EDT To: Rosetta Posner, MD, Ulyses Amor, PA-C  I just want to make sure we are documenting reason for coumadin accurately. I really think it is for arterial and not venous issues.   The 2013 vascular notes x2 click a check mark for history of DVT. If this really was 5 DVTs- why was patient not on coumadin at that time. In 2014 the DVT reference is actually a negative reference- "[ ]  Blood clot in vein/DVT"  On 10/12/13 I strongly wonder if someone in the ED erroneously put in history of DVT- which has then been copy forwarded ever since that time in the history tab.   This matters to me as we are continuing her coumadin and I want to make sure I have an accurate reason for continuing it.   The first true mention of coumadin was 10/15/13 by Dr. Oneida Alar- "Plan to start coumadin today after heparin started" after a vascular procedure during that June 2015 hospitalization.   I think we need to strongly consider removing DVT from her list - would you mind getting Dr. Donnetta Hutching or Nona Dell opinion? It certainly seems like coumadin is for PAD and not DVT.  Dr. Colin Rhein below may have been referencing notes that erroneously listed DVT.   Thanks for looking into this more, [name]   ----- Message ----- From: Ulyses Amor, PA-C Sent: 07/02/2018  11:59 AM EDT To: Marin Olp,  MD  85 y.o. female with pertinent PMH of prior DVT x 5 on coumadin, prior supratherapeutic presents with bruising to R upper arm.  No known trauma.  Not painful.  NV intact.  Wu obtained and with supratherapeutic INR.  No other symptoms.  Vit K 2.5 administered PO.  Stable to dc home with pcp fu.    I have reviewed all laboratory and imaging studies if ordered as above  1. Supratherapeutic INR        Debby Freiberg, MD 03/08/15 0827 I can't confirm this but it has been documented since 2013 in multiple notes here is an example.  It may have been put in as embolus/thrombosis and just carried through as DVT by mistake.  Even in an old note from Dr. Donnetta Hutching it is documented as DVT.  I hope this helps you clear this up.  If you go in the search box top right corner of epic when you are in her chart you can type in DVT and it will pull up all her records referencing DVT.

## 2018-08-15 NOTE — Telephone Encounter (Signed)
Noted  

## 2018-08-20 ENCOUNTER — Telehealth (INDEPENDENT_AMBULATORY_CARE_PROVIDER_SITE_OTHER): Payer: Self-pay

## 2018-08-20 NOTE — Telephone Encounter (Signed)
Submitted VOB for SynviscOne, right knee. 

## 2018-08-21 ENCOUNTER — Telehealth (INDEPENDENT_AMBULATORY_CARE_PROVIDER_SITE_OTHER): Payer: Self-pay

## 2018-08-21 NOTE — Telephone Encounter (Signed)
Talked with patient and advised her that she is approved for gel injection.  Patient is approved for SynviscOne, right knee. Buy & Bill Covered at 100% through 2nd insurance Nurse, mental health) after [employer] pays. No Co-pay No PA required  Appt.08/24/2018 with Dr. [name]

## 2018-08-22 ENCOUNTER — Ambulatory Visit: Payer: Medicare Other | Admitting: Physical Medicine & Rehabilitation

## 2018-08-24 ENCOUNTER — Ambulatory Visit (INDEPENDENT_AMBULATORY_CARE_PROVIDER_SITE_OTHER): Payer: Medicare Other | Admitting: Orthopaedic Surgery

## 2018-08-24 ENCOUNTER — Other Ambulatory Visit: Payer: Self-pay

## 2018-08-24 DIAGNOSIS — M1711 Unilateral primary osteoarthritis, right knee: Secondary | ICD-10-CM

## 2018-08-24 MED ORDER — HYLAN G-F 20 48 MG/6ML IX SOSY
48.0000 mg | PREFILLED_SYRINGE | INTRA_ARTICULAR | Status: AC | PRN
  Administered 2018-08-24: 48 mg via INTRA_ARTICULAR

## 2018-08-24 NOTE — Progress Notes (Signed)
   Procedure Note  Patient: [name] [name]             Date of Birth: 10-Nov-1924           MRN: [id number]             Visit Date: 08/24/2018  Procedures: Visit Diagnoses: Unilateral primary osteoarthritis, right knee  Large Joint Inj: R knee on 08/24/2018 11:58 AM Indications: pain Details: 22 G needle  Arthrogram: No  Medications: 48 mg Hylan 48 MG/6ML Outcome: tolerated well, no immediate complications Patient was prepped and draped in the usual sterile fashion.

## 2018-08-30 ENCOUNTER — Ambulatory Visit: Payer: Medicare Other | Admitting: Physical Medicine & Rehabilitation

## 2018-09-05 ENCOUNTER — Other Ambulatory Visit: Payer: Self-pay | Admitting: Family Medicine

## 2018-09-07 ENCOUNTER — Ambulatory Visit (INDEPENDENT_AMBULATORY_CARE_PROVIDER_SITE_OTHER): Payer: Medicare Other | Admitting: General Practice

## 2018-09-07 ENCOUNTER — Other Ambulatory Visit: Payer: Self-pay

## 2018-09-07 DIAGNOSIS — Z7901 Long term (current) use of anticoagulants: Secondary | ICD-10-CM

## 2018-09-07 LAB — POCT INR: INR: 3.5 — AB (ref 2.0–3.0)

## 2018-09-07 NOTE — Patient Instructions (Addendum)
Pre visit review using our clinic review tool, if applicable. No additional management support is needed unless otherwise documented below in the visit note.  Skip coumadin today and the change dosage and take 1 tablet daily except 1 1/2 tablets on Mondays.   Re-check in 4 weeks.

## 2018-09-11 ENCOUNTER — Telehealth: Payer: Self-pay | Admitting: Physical Medicine & Rehabilitation

## 2018-09-11 NOTE — Telephone Encounter (Signed)
She can come in if she prefers.

## 2018-09-11 NOTE — Telephone Encounter (Signed)
We have rescheduled her several times for TP. She is [place] with coming in 09/13/18. Your last note says after May. Are you ok with her coming in or should we reschedule for after May?

## 2018-09-13 ENCOUNTER — Other Ambulatory Visit: Payer: Self-pay

## 2018-09-13 ENCOUNTER — Encounter: Payer: Self-pay | Admitting: Physical Medicine & Rehabilitation

## 2018-09-13 ENCOUNTER — Encounter: Payer: Medicare Other | Attending: Physical Medicine & Rehabilitation | Admitting: Physical Medicine & Rehabilitation

## 2018-09-13 VITALS — BP 151/71 | HR 79 | Temp 98.2°F | Ht 59.0 in | Wt 168.8 lb

## 2018-09-13 DIAGNOSIS — M791 Myalgia, unspecified site: Secondary | ICD-10-CM | POA: Insufficient documentation

## 2018-09-13 DIAGNOSIS — R269 Unspecified abnormalities of gait and mobility: Secondary | ICD-10-CM | POA: Insufficient documentation

## 2018-09-13 DIAGNOSIS — M792 Neuralgia and neuritis, unspecified: Secondary | ICD-10-CM | POA: Diagnosis not present

## 2018-09-13 DIAGNOSIS — G894 Chronic pain syndrome: Secondary | ICD-10-CM | POA: Diagnosis not present

## 2018-09-13 DIAGNOSIS — M25561 Pain in right knee: Secondary | ICD-10-CM | POA: Diagnosis not present

## 2018-09-13 DIAGNOSIS — M25571 Pain in right ankle and joints of right foot: Secondary | ICD-10-CM | POA: Diagnosis not present

## 2018-09-13 DIAGNOSIS — M533 Sacrococcygeal disorders, not elsewhere classified: Secondary | ICD-10-CM | POA: Diagnosis not present

## 2018-09-13 DIAGNOSIS — M961 Postlaminectomy syndrome, not elsewhere classified: Secondary | ICD-10-CM | POA: Insufficient documentation

## 2018-09-13 DIAGNOSIS — G479 Sleep disorder, unspecified: Secondary | ICD-10-CM | POA: Insufficient documentation

## 2018-09-13 DIAGNOSIS — G8929 Other chronic pain: Secondary | ICD-10-CM | POA: Insufficient documentation

## 2018-09-13 NOTE — Progress Notes (Signed)
Trigger point injection procedure note: Trigger Point Injection: Written consent was obtained for the patient. Trigger points were identified of the right thoracolumbar paraspinal muscles (x3). The areas were cleaned with alcohol, vapocoolant spray applied, needle drawback performed, and each of  these trigger points were injected with 1 cc of 0.5% Marcaine. There were no complications from the procedure, and it was well tolerated.

## 2018-09-20 ENCOUNTER — Encounter: Payer: Self-pay | Admitting: Physical Medicine & Rehabilitation

## 2018-09-20 ENCOUNTER — Other Ambulatory Visit: Payer: Self-pay

## 2018-09-20 ENCOUNTER — Encounter (HOSPITAL_BASED_OUTPATIENT_CLINIC_OR_DEPARTMENT_OTHER): Payer: Medicare Other | Admitting: Physical Medicine & Rehabilitation

## 2018-09-20 VITALS — BP 127/73 | HR 73 | Temp 98.3°F | Ht 59.0 in | Wt 168.0 lb

## 2018-09-20 DIAGNOSIS — M25561 Pain in right knee: Secondary | ICD-10-CM | POA: Diagnosis not present

## 2018-09-20 DIAGNOSIS — M791 Myalgia, unspecified site: Secondary | ICD-10-CM | POA: Diagnosis not present

## 2018-09-20 DIAGNOSIS — M961 Postlaminectomy syndrome, not elsewhere classified: Secondary | ICD-10-CM | POA: Diagnosis not present

## 2018-09-20 DIAGNOSIS — R269 Unspecified abnormalities of gait and mobility: Secondary | ICD-10-CM | POA: Diagnosis not present

## 2018-09-20 DIAGNOSIS — G8929 Other chronic pain: Secondary | ICD-10-CM | POA: Diagnosis not present

## 2018-09-20 DIAGNOSIS — G894 Chronic pain syndrome: Secondary | ICD-10-CM | POA: Diagnosis not present

## 2018-09-20 NOTE — Progress Notes (Signed)
Trigger point injection procedure note: Trigger Point Injection: Written consent was obtained for the patient. Trigger points were identified of the right thoracolumbar paraspinal muscles (x3). The areas were cleaned with alcohol, vapocoolant spray applied, needle drawback performed, and each of  these trigger points were injected with 1 cc of 0.5% Marcaine. There were no complications from the procedure, and it was well tolerated.

## 2018-09-27 ENCOUNTER — Ambulatory Visit: Payer: Medicare Other | Admitting: Physical Medicine & Rehabilitation

## 2018-10-02 ENCOUNTER — Other Ambulatory Visit: Payer: Self-pay | Admitting: Allergy and Immunology

## 2018-10-05 ENCOUNTER — Ambulatory Visit (INDEPENDENT_AMBULATORY_CARE_PROVIDER_SITE_OTHER): Payer: Medicare Other | Admitting: General Practice

## 2018-10-05 ENCOUNTER — Other Ambulatory Visit: Payer: Self-pay

## 2018-10-05 DIAGNOSIS — Z7901 Long term (current) use of anticoagulants: Secondary | ICD-10-CM

## 2018-10-05 LAB — POCT INR: INR: 3 (ref 2.0–3.0)

## 2018-10-05 NOTE — Patient Instructions (Addendum)
Pre visit review using our clinic review tool, if applicable. No additional management support is needed unless otherwise documented below in the visit note.  Continue to take 1 tablet daily except 1 1/2 tablets on Mondays.   Re-check in 4 weeks.

## 2018-10-10 ENCOUNTER — Ambulatory Visit: Payer: Medicare Other | Admitting: Physical Medicine & Rehabilitation

## 2018-10-11 ENCOUNTER — Encounter: Payer: Medicare Other | Attending: Physical Medicine & Rehabilitation | Admitting: Physical Medicine & Rehabilitation

## 2018-10-11 ENCOUNTER — Other Ambulatory Visit: Payer: Self-pay

## 2018-10-11 ENCOUNTER — Encounter: Payer: Self-pay | Admitting: Physical Medicine & Rehabilitation

## 2018-10-11 VITALS — BP 157/69 | HR 74 | Temp 98.9°F | Ht 59.0 in | Wt 160.0 lb

## 2018-10-11 DIAGNOSIS — G894 Chronic pain syndrome: Secondary | ICD-10-CM | POA: Insufficient documentation

## 2018-10-11 DIAGNOSIS — M791 Myalgia, unspecified site: Secondary | ICD-10-CM | POA: Diagnosis not present

## 2018-10-11 DIAGNOSIS — E669 Obesity, unspecified: Secondary | ICD-10-CM | POA: Diagnosis not present

## 2018-10-11 DIAGNOSIS — G8929 Other chronic pain: Secondary | ICD-10-CM | POA: Insufficient documentation

## 2018-10-11 DIAGNOSIS — G479 Sleep disorder, unspecified: Secondary | ICD-10-CM | POA: Insufficient documentation

## 2018-10-11 DIAGNOSIS — M5441 Lumbago with sciatica, right side: Secondary | ICD-10-CM

## 2018-10-11 DIAGNOSIS — R269 Unspecified abnormalities of gait and mobility: Secondary | ICD-10-CM | POA: Diagnosis not present

## 2018-10-11 DIAGNOSIS — M533 Sacrococcygeal disorders, not elsewhere classified: Secondary | ICD-10-CM | POA: Insufficient documentation

## 2018-10-11 DIAGNOSIS — M25561 Pain in right knee: Secondary | ICD-10-CM | POA: Insufficient documentation

## 2018-10-11 DIAGNOSIS — M25571 Pain in right ankle and joints of right foot: Secondary | ICD-10-CM | POA: Insufficient documentation

## 2018-10-11 DIAGNOSIS — I779 Disorder of arteries and arterioles, unspecified: Secondary | ICD-10-CM

## 2018-10-11 DIAGNOSIS — M1711 Unilateral primary osteoarthritis, right knee: Secondary | ICD-10-CM

## 2018-10-11 DIAGNOSIS — M792 Neuralgia and neuritis, unspecified: Secondary | ICD-10-CM | POA: Diagnosis not present

## 2018-10-11 DIAGNOSIS — M961 Postlaminectomy syndrome, not elsewhere classified: Secondary | ICD-10-CM | POA: Diagnosis not present

## 2018-10-11 NOTE — Patient Instructions (Signed)
[email or web address]

## 2018-10-11 NOTE — Progress Notes (Signed)
Subjective:    Patient ID: [name] [name], female    DOB: 1924-11-10, 85 y.o.   MRN: [id number]  HPI  Female with pmh/psh of PAD, COPD, DVT, HTN, Gout, trigeminal neuralgia, post herpetic neuralgia, OA of right knee with injections, lumbar fusion present for follow up of pain in her back > right leg pain.   Initially stated: Pt stated she has had back pain "all her life".  Getting progressively worse.  Movement improves the pain.  Standing and walking exacerbate the pain. Dull pain.  Non-radiating.  Intermittent.  Denies associated numbness, tingling, weakness.  Lidoderm patch help. Pain limits pt from doing things around the house.   Last clinic visit 09/20/2018.  She had trigger point injections at that time.  Since that time, she states she did "great" with the injections. She continues to use Lidoderm patch and TENS. She is using Baclofen occasionally with benefit. Denies falls.  Pain Inventory Average Pain 8 Pain Right Now 2 My pain is aching  In the last 24 hours, has pain interfered with the following? General activity 5 Relation with others 0 Enjoyment of life 0 What TIME of day is your pain at its worst? daytime and night / when up on feet,  Sleep (in general) Good  Pain is worse with: walking, bending, standing, some activites and weight bearing Pain improves with: rest, pacing activities and laying down  Relief from Meds: 10  Mobility walk without assistance use a cane how many minutes can you walk? 2-3 ability to climb steps?  yes do you drive?  yes Do you have any goals in this area?  yes  Function retired  Neuro/Psych bladder control problems trouble walking  Prior Studies Any changes since last visit?  no  Physicians involved in your care Any changes since last visit?  no   Family History  Problem Relation Age of Onset  . Arthritis Mother   . Hypertension Mother   . Heart disease Father   . Colon polyps Father   . Breast cancer Sister   .  Breast cancer Daughter   . Hypertension Son   . Lung cancer Brother   . Colon cancer Sister   . Brain cancer Sister   . Lung cancer Sister   . Anesthesia problems Neg Hx   . Hypotension Neg Hx   . Malignant hyperthermia Neg Hx   . Pseudochol deficiency Neg Hx    Social History   Socioeconomic History  . Marital status: Single    Spouse name: Not on file  . Number of children: 4  . Years of education: 50  . Highest education level: Not on file  Occupational History  . Occupation: retired    Fish farm manager: RETIRED  Social Needs  . Financial resource strain: Not on file  . Food insecurity    Worry: Not on file    Inability: Not on file  . Transportation needs    Medical: Not on file    Non-medical: Not on file  Tobacco Use  . Smoking status: Former Smoker    Packs/day: 2.00    Years: 30.00    Pack years: 60.00    Types: Cigarettes    Quit date: 06/18/1993    Years since quitting: 25.3  . Smokeless tobacco: Never Used  . Tobacco comment: QUIT IN 1995  Substance and Sexual Activity  . Alcohol use: No    Alcohol/week: 0.0 standard drinks  . Drug use: No  . Sexual activity: Not on  file  Lifestyle  . Physical activity    Days per week: Not on file    Minutes per session: Not on file  . Stress: Not on file  Relationships  . Social Herbalist on phone: Not on file    Gets together: Not on file    Attends religious service: Not on file    Active member of club or organization: Not on file    Attends meetings of clubs or organizations: Not on file    Relationship status: Not on file  Other Topics Concern  . Not on file  Social History Narrative   Patient is widowed with 4 children. 2 grandkids. Lives alone.    Patient is right handed.   Patient has high school education.   Patient drinks 1 cup daily.      Retired from [employer] for 28 years, works 2 days a week until 2016.       Hobbies: volunteers at [employer], [place] from church visits  people.       Past Surgical History:  Procedure Laterality Date  . ABDOMINAL AORTAGRAM N/A 06/17/2011   Procedure: ABDOMINAL Maxcine Ham;  Surgeon: Elam Dutch, MD;  [facility] C[facility] LAB;  Service: Cardiovascular;  Laterality: N/A;  . ABDOMINAL AORTOGRAM W/LOWER EXTREMITY Bilateral 06/22/2018   Procedure: ABDOMINAL AORTOGRAM W/LOWER EXTREMITY;  Surgeon: Elam Dutch, MD;  Location: [facility] CV LAB;  Service: Cardiovascular;  Laterality: Bilateral;  . CATARACT EXTRACTION, BILATERAL     w IOL  . CHOLECYSTECTOMY  1998  . FEMORAL-POPLITEAL BYPASS GRAFT        x2 surgeries 1990's & 2009  . INJECTION KNEE Right Aug. 2016   Gel injection for pain  . LUMBAR FUSION  07/06/2011  . TONSILLECTOMY     as a teenager   . TUBAL LIGATION     Past Medical History:  Diagnosis Date  . Allergic rhinoconjunctivitis   . Anxiety    pt. managed- uses deep breathing   . Arthritis    low back , stenosis  . COLONIC POLYPS, RECURRENT 08/29/2006   2008 last colonoscopy. No further colonoscopy.     Marland Kitchen COPD (chronic obstructive pulmonary disease) ([place])   . Diverticulosis   . Eczema   . Environmental allergies    allergy shot- q friday in Dr. Janee Morn office. PFT's abnormal- recommended Spiriva to use preop & will d/c after surgery  . GERD (gastroesophageal reflux disease)   . Hiatal hernia   . Hyperlipidemia   . Hypertension   . Lung nodule 2011  . Peripheral vascular disease ([place])   . Shingles   . Stenosis of popliteal artery (HCC)    blood clots in legs long ago    . Trigeminal neuralgia    BP (!) 157/69   Pulse 74   Temp 98.9 F (37.2 C)   Ht 4\' 11"  (1.499 m)   Wt 160 lb (72.6 kg)   SpO2 94%   BMI 32.32 kg/m   Opioid Risk Score:   Fall Risk Score:  `1  Depression screen PHQ 2/9  Depression screen [facility] 2/9 08/08/2018 05/17/2018 01/05/2018 11/01/2017 02/22/2017 10/31/2016 06/16/2016  Decreased Interest 0 0 0 0 0 0 0  Down, Depressed, Hopeless 0 0 0 0 0 0 0  PHQ - 2 Score 0 0 0 0 0 0 0   Altered sleeping - - - 0 - - -  Tired, decreased energy - - - 0 - - -  Change in appetite - - - 0 - - -  Feeling bad or failure about yourself  - - - 0 - - -  Trouble concentrating - - - 0 - - -  Moving slowly or fidgety/restless - - - 0 - - -  Suicidal thoughts - - - 0 - - -  PHQ-9 Score - - - 0 - - -  Difficult doing work/chores - - - Not difficult at all - - -  Some recent data might be hidden   Review of Systems  HENT: Negative.   Eyes: Negative.   Respiratory: Negative.   Cardiovascular: Negative.   Gastrointestinal: Negative.        Left flank pain  Endocrine: Negative.        High blood pressure   Genitourinary: Positive for difficulty urinating.  Musculoskeletal: Positive for arthralgias, back pain, gait problem and myalgias.  Skin: Negative.   Allergic/Immunologic: Negative.   Hematological: Negative.   Psychiatric/Behavioral: Negative.   All other systems reviewed and are negative.     Objective:   Physical Exam Gen: NAD. Vital signs reviewed HENT: Normocephalic, Atraumatic Eyes: EOMI. No discharge. Cardio: No JVD. Pulm: Effort normal Abd: Nondistended MSK:   Gait mildly antalgic.  Neuro: Alert             Strength          5/5 grossly throughout Skin: Intact. Warm and dry.    Assessment & Plan:  Female with pmhpsh of PAD, COPD, DVT, HTN, Gout, trigeminal neuralgia, post herpetic neuralgia, OA of right knee with injections, lumbar fusion present for follow up of pain in her back > right leg pain.    1. Chronic low back pain with failed back syndrome   Xray of L-spine and right SI joint reviewed, SI joint unremarkable, degenerative changes in spine with right scoliosis.    Will avoid NSAIDs due to coumadin  Stopped using Robaxin due to nausea  Unable to tolerate Gabapentin  Cont heat  Cont tylenol, limit to 2000mg /day  Cont Lidoderm patches  Cont HEP, completed PT  Cont TENS unit PRN  Cont Cymbalta to 60 mg  Cont Baclofen 5 BID, may increase to 10 BID  if no side effects  2. Possible Sacroiliitis on right  Xray unremakrable  Minimal benefit with bracing   Improving  3. Sleep disturbance  Significantly improved  Cont melatonin  4. Neuropathic pain  See #1  5. Gait abnormality  Improved  Cont cane   Cont HEP  Information given on core strengthening exercises   6. Myalgia  Very good benefit with trigger point injections  7. Right knee pain  MRI reviewed from 2016, showing meniscal/cartilage damage  Receiving injections from Ortho, cont  Pensaid denied  Cont voltaren gel  Cont lidoderm on knee  Improved  8. Right ankle pain - likely gout flare  Lidoderm patches, pt using Voltaren gel  Xrays reviewed, relatively uremarkable  Uric acid elevated  Improved

## 2018-10-23 ENCOUNTER — Encounter: Payer: Self-pay | Admitting: Allergy and Immunology

## 2018-10-23 ENCOUNTER — Other Ambulatory Visit: Payer: Self-pay

## 2018-10-23 ENCOUNTER — Ambulatory Visit (INDEPENDENT_AMBULATORY_CARE_PROVIDER_SITE_OTHER): Payer: Medicare Other | Admitting: Allergy and Immunology

## 2018-10-23 VITALS — BP 140/66 | HR 74 | Temp 97.6°F | Resp 16

## 2018-10-23 DIAGNOSIS — I779 Disorder of arteries and arterioles, unspecified: Secondary | ICD-10-CM | POA: Diagnosis not present

## 2018-10-23 DIAGNOSIS — H04123 Dry eye syndrome of bilateral lacrimal glands: Secondary | ICD-10-CM | POA: Diagnosis not present

## 2018-10-23 DIAGNOSIS — K219 Gastro-esophageal reflux disease without esophagitis: Secondary | ICD-10-CM

## 2018-10-23 DIAGNOSIS — J3089 Other allergic rhinitis: Secondary | ICD-10-CM

## 2018-10-23 NOTE — Progress Notes (Signed)
[place]   Follow-up Note  Referring Provider: Marin Olp, MD Primary Provider: Marin Olp, MD Date of Office Visit: 10/23/2018  Subjective:   [name] [name] (DOB: 22-Mar-1925) is a 85 y.o. female who returns to the [facility] [place] on 10/23/2018 in re-evaluation of the following:  HPI: [name] [name] returns to this clinic in evaluation of LPR and dry eye syndrome and chronic rhinitis.  Her last visit to this clinic was 24 April 2018.  During the interval she still maintains very good control of her issues.  She feels as though her throat is doing well even though she discontinued her famotidine during her last visit and maintains control just with a proton pump inhibitor.  She believes as though her nose is doing very well with minimal rhinorrhea while using nasal ipratropium approximately twice a day.  She continues to use Systane and Restasis for her dry eye syndrome.  She did have issues with choking when eating in the past but that had basically resolved up until last week at which point in time she had a few episodes of choking.  She thinks this is related to eating very spicy food.  Fortunately, over the course of the past 48 hours this has resolved.  She has been having some significant issues with claudication of her lower extremities especially her right leg and she has undergone evaluation for this issue and was found to have significant limitation of blood flow in both femoral arteries and is making a decision about how to approach that issue in the near future.  Allergies as of 10/23/2018      Reactions   Sulfa Antibiotics Other (See Comments)   Cold sweat light headed and disorientation   Tiotropium Bromide Shortness Of Breath, Other (See Comments)   Sore throat also   Ultram [tramadol] Nausea And Vomiting   Latex Itching, Rash      Medication List      acetaminophen 500 MG tablet Commonly known  as: TYLENOL Take 1,000 mg by mouth 2 (two) times daily. Reported on 08/31/2015   aspirin EC 81 MG tablet Take 81 mg by mouth every evening.   baclofen 10 MG tablet Commonly known as: LIORESAL Take 1 tablet (10 mg total) by mouth 2 (two) times daily.   colchicine 0.6 MG tablet Commonly known as: Colcrys Take 2 pills at first sign gout flare, then another pill 2 hours later if still having pain. Then take once daily until pain resolves. What changed:   how much to take  how to take this  when to take this  additional instructions   diclofenac sodium 1 % Gel Commonly known as: VOLTAREN APPLY 2 GRAMS TO AFFECTED AREA  TOPICALLY 4 TIMES DAILY What changed:   how much to take  how to take this  when to take this  reasons to take this  additional instructions   DULoxetine 60 MG capsule Commonly known as: CYMBALTA Take 1 capsule by mouth once daily   hydrochlorothiazide 25 MG tablet Commonly known as: HYDRODIURIL TAKE 1 TABLET BY MOUTH ONCE DAILY What changed: when to take this   ipratropium 0.06 % nasal spray Commonly known as: ATROVENT INHALE 2 SPRAYS INTO BOTH NOSTRILS THREE TIMES A DAY   irbesartan 300 MG tablet Commonly known as: AVAPRO TAKE 1 TABLET BY MOUTH ONCE DAILY   levocetirizine 5 MG tablet Commonly known as: XYZAL Take 5 mg by mouth every  evening.   multivitamin with minerals Tabs tablet Take 1 tablet by mouth daily.   pantoprazole 40 MG tablet Commonly known as: PROTONIX Take 1 tablet by mouth twice daily   Restasis 0.05 % ophthalmic emulsion Generic drug: cycloSPORINE Place 1 drop into both eyes 2 (two) times daily.   simvastatin 40 MG tablet Commonly known as: ZOCOR TAKE 1 TABLET BY MOUTH ONCE DAILY AT 6PM What changed: See the new instructions.   warfarin 5 MG tablet Commonly known as: COUMADIN Take as directed by the anticoagulation clinic. If you are unsure how to take this medication, talk to your nurse or doctor. Original  instructions: TAKE 2 TABLETS BY MOUTH ONCE DAILY, EXCEPT TAKE 2 & 1/2 TABLETS ON THURSDAY       Past Medical History:  Diagnosis Date  . Allergic rhinoconjunctivitis   . Anxiety    pt. managed- uses deep breathing   . Arthritis    low back , stenosis  . COLONIC POLYPS, RECURRENT 08/29/2006   2008 last colonoscopy. No further colonoscopy.     Marland Kitchen COPD (chronic obstructive pulmonary disease) ([place])   . Diverticulosis   . Eczema   . Environmental allergies    allergy shot- q friday in Dr. Janee Morn office. PFT's abnormal- recommended Spiriva to use preop & will d/c after surgery  . GERD (gastroesophageal reflux disease)   . Hiatal hernia   . Hyperlipidemia   . Hypertension   . Lung nodule 2011  . Peripheral vascular disease ([place])   . Shingles   . Stenosis of popliteal artery (HCC)    blood clots in legs long ago    . Trigeminal neuralgia     Past Surgical History:  Procedure Laterality Date  . ABDOMINAL AORTAGRAM N/A 06/17/2011   Procedure: ABDOMINAL Maxcine Ham;  Surgeon: Elam Dutch, MD;  Location: [facility] CATH LAB;  Service: Cardiovascular;  Laterality: N/A;  . ABDOMINAL AORTOGRAM W/LOWER EXTREMITY Bilateral 06/22/2018   Procedure: ABDOMINAL AORTOGRAM W/LOWER EXTREMITY;  Surgeon: Elam Dutch, MD;  Location: [place] CV LAB;  Service: Cardiovascular;  Laterality: Bilateral;  . CATARACT EXTRACTION, BILATERAL     w IOL  . CHOLECYSTECTOMY  1998  . FEMORAL-POPLITEAL BYPASS GRAFT        x2 surgeries 1990's & 2009  . INJECTION KNEE Right Aug. 2016   Gel injection for pain  . LUMBAR FUSION  07/06/2011  . TONSILLECTOMY     as a teenager   . TUBAL LIGATION      Review of systems negative except as noted in HPI / PMHx or noted below:  Review of Systems  Constitutional: Negative.   HENT: Negative.   Eyes: Negative.   Respiratory: Negative.   Cardiovascular: Negative.   Gastrointestinal: Negative.   Genitourinary: Negative.   Musculoskeletal: Negative.   Skin: Negative.    Neurological: Negative.   Endo/Heme/Allergies: Negative.   Psychiatric/Behavioral: Negative.      Objective:   Vitals:   10/23/18 1031  BP: 140/66  Pulse: 74  Resp: 16  Temp: 97.6 F (36.4 C)  SpO2: 95%          Physical Exam Constitutional:      Appearance: She is not diaphoretic.  HENT:     Head: Normocephalic.     Right Ear: Tympanic membrane, ear canal and external ear normal.     Left Ear: Tympanic membrane, ear canal and external ear normal.     Nose: Nose normal. No mucosal edema or rhinorrhea.     Mouth/Throat:  Pharynx: Uvula midline. No oropharyngeal exudate.  Eyes:     Conjunctiva/sclera: Conjunctivae normal.  Neck:     Thyroid: No thyromegaly.     Trachea: Trachea normal. No tracheal tenderness or tracheal deviation.  Cardiovascular:     Rate and Rhythm: Normal rate and regular rhythm.     Heart sounds: Normal heart sounds, S1 normal and S2 normal. No murmur.  Pulmonary:     Effort: No respiratory distress.     Breath sounds: Normal breath sounds. No stridor. No wheezing or rales.  Lymphadenopathy:     Head:     Right side of head: No tonsillar adenopathy.     Left side of head: No tonsillar adenopathy.     Cervical: No cervical adenopathy.  Skin:    Findings: No erythema or rash.     Nails: There is no clubbing.   Neurological:     Mental Status: She is alert.     Diagnostics: none  Assessment and Plan:   1. Other allergic rhinitis   2. Dry eye syndrome of both eyes   3. LPRD (laryngopharyngeal reflux disease)     1.  Continue Systane products, Restasis   2.  Can use nasal ipratropium 1-2 sprays each nostril every 6-8 hours if needed  3.  Continue to Treat LPR:   A.  Pantoprazole 40mg  - 1 tablet 2 times per day  4. Return to clinic in 12 months or earlier if problem.    5.  Obtain full flu vaccine  Allisyn is really doing very well for what we follow her for in this clinic which includes rhinitis and dry eye syndrome and  LPR.  I see no need for altering her therapy as noted above.  Assuming she does well I will just see her back in this clinic in 1 year or earlier if there is a problem.  Allena Katz, MD Allergy / Immunology [place]

## 2018-10-23 NOTE — Patient Instructions (Addendum)
  1.  Continue Systane products, Restasis   2.  Can use nasal ipratropium 1-2 sprays each nostril every 6-8 hours if needed  3.  Continue to Treat LPR:   A.  Pantoprazole 40mg  - 1 tablet 2 times per day  4. Return to clinic in 12 months or earlier if problem.    5.  Obtain full flu vaccine

## 2018-10-24 ENCOUNTER — Encounter: Payer: Self-pay | Admitting: Allergy and Immunology

## 2018-10-29 ENCOUNTER — Ambulatory Visit: Payer: Medicare Other

## 2018-10-30 ENCOUNTER — Other Ambulatory Visit: Payer: Self-pay

## 2018-10-30 ENCOUNTER — Ambulatory Visit (INDEPENDENT_AMBULATORY_CARE_PROVIDER_SITE_OTHER): Payer: Medicare Other | Admitting: General Practice

## 2018-10-30 DIAGNOSIS — Z7901 Long term (current) use of anticoagulants: Secondary | ICD-10-CM | POA: Diagnosis not present

## 2018-10-30 LAB — POCT INR: INR: 1.5 — AB (ref 2.0–3.0)

## 2018-10-30 NOTE — Patient Instructions (Addendum)
Pre visit review using our clinic review tool, if applicable. No additional management support is needed unless otherwise documented below in the visit note.  Take 1 1/2 tablets today and tomorrow and then continue to take 1 tablet daily except 1 1/2 tablets on Mondays.   Re-check in 10 days

## 2018-11-02 NOTE — Progress Notes (Signed)
Medical screening examination/treatment/procedure(s) were performed by non-physician practitioner and as supervising physician I was immediately available for consultation/collaboration. I agree with above. Tyshell Ramberg, MD   

## 2018-11-04 ENCOUNTER — Other Ambulatory Visit: Payer: Self-pay | Admitting: Physical Medicine & Rehabilitation

## 2018-11-06 ENCOUNTER — Ambulatory Visit: Payer: Medicare Other

## 2018-11-07 ENCOUNTER — Telehealth (HOSPITAL_COMMUNITY): Payer: Self-pay | Admitting: Rehabilitation

## 2018-11-07 NOTE — Telephone Encounter (Signed)

## 2018-11-08 ENCOUNTER — Encounter: Payer: Self-pay | Admitting: Vascular Surgery

## 2018-11-08 ENCOUNTER — Ambulatory Visit: Payer: Medicare Other | Admitting: Vascular Surgery

## 2018-11-08 ENCOUNTER — Other Ambulatory Visit: Payer: Self-pay

## 2018-11-08 ENCOUNTER — Ambulatory Visit (HOSPITAL_COMMUNITY)
Admission: RE | Admit: 2018-11-08 | Discharge: 2018-11-08 | Disposition: A | Discharge status: Home / Self Care | Payer: Medicare Other | Source: Ambulatory Visit | Attending: Family | Admitting: Family

## 2018-11-08 ENCOUNTER — Other Ambulatory Visit (HOSPITAL_COMMUNITY): Payer: Self-pay | Admitting: Vascular Surgery

## 2018-11-08 ENCOUNTER — Ambulatory Visit (INDEPENDENT_AMBULATORY_CARE_PROVIDER_SITE_OTHER): Payer: Medicare Other | Admitting: Vascular Surgery

## 2018-11-08 VITALS — BP 116/72 | HR 74 | Temp 97.6°F | Resp 20 | Ht 59.0 in | Wt 164.0 lb

## 2018-11-08 DIAGNOSIS — I739 Peripheral vascular disease, unspecified: Secondary | ICD-10-CM | POA: Diagnosis not present

## 2018-11-08 DIAGNOSIS — I779 Disorder of arteries and arterioles, unspecified: Secondary | ICD-10-CM | POA: Diagnosis not present

## 2018-11-08 NOTE — Progress Notes (Signed)
Patient is a 85 year old female who returns for follow-up today.  She previously underwent a right femoral to above-knee popliteal bypass by [name] in 2006.  This was revised and extended to the below-knee popliteal artery by me in 2010.  She also had a previous left femoral to above-knee popliteal bypass by [name] in 2006.  She has also previously had one thrombolysis procedure.  She has been experiencing more weakness and shorter distance claudication in the right leg.  She had an arteriogram recently which showed an 80% narrowing of the midsection of her right femoropopliteal bypass.  She additionally has a 70% stenosis of her left common femoral artery.  She does not really complain of left leg symptoms.  She is here today for further discussion guarding and intervention on one or both legs.  He is very concerned about any operations due to her age and risk of general anesthesia.  She denies rest pain.  She does not have any ulcers on her feet.  Her arteriogram was in February 2020.  Past Medical History:  Diagnosis Date  . Allergic rhinoconjunctivitis   . Allergy   . Anxiety    pt. managed- uses deep breathing   . Arthritis    low back , stenosis  . COLONIC POLYPS, RECURRENT 08/29/2006   2008 last colonoscopy. No further colonoscopy.     Marland Kitchen COPD (chronic obstructive pulmonary disease) ([place])   . Diverticulosis   . Eczema   . Environmental allergies    allergy shot- q friday in Dr. Janee Morn office. PFT's abnormal- recommended Spiriva to use preop & will d/c after surgery  . GERD (gastroesophageal reflux disease)   . Hiatal hernia   . Hyperlipidemia   . Hypertension   . Lung nodule 2011  . Peripheral vascular disease ([place])   . Shingles   . Stenosis of popliteal artery ([facility])    blood clots in legs long ago    . Trigeminal neuralgia     Past Surgical History:  Procedure Laterality Date  . ABDOMINAL AORTAGRAM N/A 06/17/2011   Procedure: ABDOMINAL Maxcine Ham;  Surgeon: Elam Dutch, MD;  Location: [facility] CATH LAB;  Service: Cardiovascular;  Laterality: N/A;  . ABDOMINAL AORTOGRAM W/LOWER EXTREMITY Bilateral 06/22/2018   Procedure: ABDOMINAL AORTOGRAM W/LOWER EXTREMITY;  Surgeon: Elam Dutch, MD;  Location: [facility] CV LAB;  Service: Cardiovascular;  Laterality: Bilateral;  . CATARACT EXTRACTION, BILATERAL     w IOL  . CHOLECYSTECTOMY  1998  . FEMORAL-POPLITEAL BYPASS GRAFT        x2 surgeries 1990's & 2009  . INJECTION KNEE Right Aug. 2016   Gel injection for pain  . LUMBAR FUSION  07/06/2011  . TONSILLECTOMY     as a teenager   . TUBAL LIGATION     Current Outpatient Medications on File Prior to Visit  Medication Sig Dispense Refill  . acetaminophen (TYLENOL) 500 MG tablet Take 1,000 mg by mouth 2 (two) times daily. Reported on 08/31/2015    . aspirin EC 81 MG tablet Take 81 mg by mouth every evening.     . colchicine (COLCRYS) 0.6 MG tablet Take 2 pills at first sign gout flare, then another pill 2 hours later if still having pain. Then take once daily until pain resolves. (Patient taking differently: Take 0.6-1.2 mg by mouth See admin instructions. Take 1.2 mg at first sign gout flare, then 0.6 mg 2 hours later if still having pain. Then take 0.6 mg once daily until pain  resolves.) 30 tablet 0  . diclofenac sodium (VOLTAREN) 1 % GEL APPLY 2 GRAMS TO AFFECTED AREA  TOPICALLY 4 TIMES DAILY (Patient taking differently: Apply 2 g topically 4 (four) times daily as needed (back pain). ) 100 g 0  . DULoxetine (CYMBALTA) 60 MG capsule Take 1 capsule by mouth once daily 30 capsule 2  . hydrochlorothiazide (HYDRODIURIL) 25 MG tablet TAKE 1 TABLET BY MOUTH ONCE DAILY (Patient taking differently: Take 25 mg by mouth at bedtime. ) 90 tablet 3  . ipratropium (ATROVENT) 0.06 % nasal spray INHALE 2 SPRAYS INTO BOTH NOSTRILS THREE TIMES A DAY 15 mL 8  . irbesartan (AVAPRO) 300 MG tablet TAKE 1 TABLET BY MOUTH ONCE DAILY (Patient taking differently: Take 300 mg by mouth daily.  ) 90 tablet 3  . levocetirizine (XYZAL) 5 MG tablet Take 5 mg by mouth every evening.    . Multiple Vitamin (MULTIVITAMIN WITH MINERALS) TABS tablet Take 1 tablet by mouth daily.    . pantoprazole (PROTONIX) 40 MG tablet Take 1 tablet by mouth twice daily 60 tablet 5  . RESTASIS 0.05 % ophthalmic emulsion Place 1 drop into both eyes 2 (two) times daily.   99  . simvastatin (ZOCOR) 40 MG tablet TAKE 1 TABLET BY MOUTH ONCE DAILY AT 6PM (Patient taking differently: Take 40 mg by mouth daily at 6 PM. ) 90 tablet 2  . warfarin (COUMADIN) 5 MG tablet TAKE 2 TABLETS BY MOUTH ONCE DAILY, EXCEPT TAKE 2 & 1/2 TABLETS ON THURSDAY 180 tablet 0   No current facility-administered medications on file prior to visit.     Social History   Socioeconomic History  . Marital status: Single    Spouse name: Not on file  . Number of children: 4  . Years of education: 58  . Highest education level: Not on file  Occupational History  . Occupation: retired    Fish farm manager: RETIRED  Social Needs  . Financial resource strain: Not on file  . Food insecurity    Worry: Not on file    Inability: Not on file  . Transportation needs    Medical: Not on file    Non-medical: Not on file  Tobacco Use  . Smoking status: Former Smoker    Packs/day: 2.00    Years: 30.00    Pack years: 60.00    Types: Cigarettes    Quit date: 06/18/1993    Years since quitting: 25.4  . Smokeless tobacco: Never Used  . Tobacco comment: QUIT IN 1995  Substance and Sexual Activity  . Alcohol use: No    Alcohol/week: 0.0 standard drinks  . Drug use: No  . Sexual activity: Not on file  Lifestyle  . Physical activity    Days per week: Not on file    Minutes per session: Not on file  . Stress: Not on file  Relationships  . Social Herbalist on phone: Not on file    Gets together: Not on file    Attends religious service: Not on file    Active member of club or organization: Not on file    Attends meetings of clubs or  organizations: Not on file    Relationship status: Not on file  . Intimate partner violence    Fear of current or ex partner: Not on file    Emotionally abused: Not on file    Physically abused: Not on file    Forced sexual activity: Not on file  Other  Topics Concern  . Not on file  Social History Narrative   Patient is widowed with 4 children. 2 grandkids. Lives alone.    Patient is right handed.   Patient has high school education.   Patient drinks 1 cup daily.      Retired from [employer] for 28 years, works 2 days a week until 2016.       Hobbies: volunteers at [employer], [place] from church visits people.        Allergies  Allergen Reactions  . Sulfa Antibiotics Other (See Comments)    Cold sweat light headed and disorientation  . Tiotropium Bromide Shortness Of Breath and Other (See Comments)    Sore throat also  . Ultram [Tramadol] Nausea And Vomiting  . Latex Itching and Rash    Review of systems: She has no chest pain.  She has no shortness of breath.  She does fatigue easily.  Physical exam:  Vitals:   11/08/18 1023  BP: 116/72  Pulse: 74  Resp: 20  Temp: 97.6 F (36.4 C)  SpO2: 97%  Weight: 164 lb (74.4 kg)  Height: 4\' 11"  (1.499 m)    Extremities: 2+ femoral pulses bilaterally absent popliteal and pedal pulses bilaterally  Skin: No open ulcer or wound  Data: Patient had bilateral duplex ultrasound of her bypass graft today.  I reviewed and interpreted the study.  This again shows a 70% stenosis of the left common femoral artery.  There is a mid 80% stenosis of the right bypass graft.  Assessment: Mild to moderate claudication symptoms right lower extremity with known bypass graft narrowing mid segment.  I gave the patient the option of fixing this with a aortogram lower extremity runoff from a contralateral approach with the plan to stick the artery above the femoropopliteal and above the area of common femoral stenosis.  I also  discussed with her the possibility of left common femoral endarterectomy with local anesthesia as well as treating her right leg with an endovascular approach in the same setting.  Also discussed with her the possibility of doing a left common femoral endarterectomy alone.  Her symptoms are primarily in her right leg.  She is entertaining whether or not to have an angioplasty of her right leg bypass graft.  She still wishes to think about this further.  Plan: Patient will think about this for a few more days.  If she decides not to proceed with the procedure we will see her back in follow-up in 3 to 6 months.  Otherwise we will schedule arteriogram for her in the near future for intervention in her left groin with a femoral endarterectomy.  I discussed the risk benefits possible complications and procedure details with the patient and her daughter Jenny Reichmann by phone today.  She understands and agrees to proceed if she decides to schedule.  [name], MD [facility] Office: [phone] Pager: [phone]

## 2018-11-09 ENCOUNTER — Ambulatory Visit (INDEPENDENT_AMBULATORY_CARE_PROVIDER_SITE_OTHER): Payer: Medicare Other | Admitting: General Practice

## 2018-11-09 DIAGNOSIS — Z7901 Long term (current) use of anticoagulants: Secondary | ICD-10-CM | POA: Diagnosis not present

## 2018-11-09 LAB — POCT INR: INR: 2.7 (ref 2.0–3.0)

## 2018-11-09 NOTE — Patient Instructions (Addendum)
Pre visit review using our clinic review tool, if applicable. No additional management support is needed unless otherwise documented below in the visit note.  Continue to take 1 tablet daily except 1 1/2 tablets on Mondays.   Re-check in 4 weeks.

## 2018-11-15 ENCOUNTER — Other Ambulatory Visit: Payer: Self-pay | Admitting: Family Medicine

## 2018-11-15 ENCOUNTER — Ambulatory Visit: Payer: Medicare Other | Admitting: Family Medicine

## 2018-11-29 ENCOUNTER — Other Ambulatory Visit: Payer: Self-pay | Admitting: Physical Medicine & Rehabilitation

## 2018-12-05 ENCOUNTER — Ambulatory Visit (INDEPENDENT_AMBULATORY_CARE_PROVIDER_SITE_OTHER): Payer: Medicare Other | Admitting: Family Medicine

## 2018-12-05 ENCOUNTER — Encounter: Payer: Self-pay | Admitting: Family Medicine

## 2018-12-05 ENCOUNTER — Other Ambulatory Visit: Payer: Self-pay

## 2018-12-05 VITALS — BP 120/62 | HR 71 | Temp 97.8°F | Ht 59.0 in | Wt 164.0 lb

## 2018-12-05 DIAGNOSIS — J449 Chronic obstructive pulmonary disease, unspecified: Secondary | ICD-10-CM

## 2018-12-05 DIAGNOSIS — N183 Chronic kidney disease, stage 3 unspecified: Secondary | ICD-10-CM

## 2018-12-05 DIAGNOSIS — R739 Hyperglycemia, unspecified: Secondary | ICD-10-CM | POA: Diagnosis not present

## 2018-12-05 DIAGNOSIS — E785 Hyperlipidemia, unspecified: Secondary | ICD-10-CM | POA: Diagnosis not present

## 2018-12-05 DIAGNOSIS — I779 Disorder of arteries and arterioles, unspecified: Secondary | ICD-10-CM

## 2018-12-05 DIAGNOSIS — I739 Peripheral vascular disease, unspecified: Secondary | ICD-10-CM

## 2018-12-05 DIAGNOSIS — Z0000 Encounter for general adult medical examination without abnormal findings: Secondary | ICD-10-CM

## 2018-12-05 DIAGNOSIS — I10 Essential (primary) hypertension: Secondary | ICD-10-CM | POA: Diagnosis not present

## 2018-12-05 DIAGNOSIS — E669 Obesity, unspecified: Secondary | ICD-10-CM

## 2018-12-05 LAB — COMPREHENSIVE METABOLIC PANEL
ALT: 9 U/L (ref 0–35)
AST: 12 U/L (ref 0–37)
Albumin: 4.7 g/dL (ref 3.5–5.2)
Alkaline Phosphatase: 71 U/L (ref 39–117)
BUN: 32 mg/dL — ABNORMAL HIGH (ref 6–23)
CO2: 25 mEq/L (ref 19–32)
Calcium: 9.8 mg/dL (ref 8.4–10.5)
Chloride: 102 mEq/L (ref 96–112)
Creatinine, Ser: 1.23 mg/dL — ABNORMAL HIGH (ref 0.40–1.20)
GFR: 40.83 mL/min — ABNORMAL LOW (ref >60.00)
Glucose, Bld: 102 mg/dL — ABNORMAL HIGH (ref 70–99)
Potassium: 5 mEq/L (ref 3.5–5.1)
Sodium: 138 mEq/L (ref 135–145)
Total Bilirubin: 0.6 mg/dL (ref 0.2–1.2)
Total Protein: 6.8 g/dL (ref 6.0–8.3)

## 2018-12-05 LAB — CBC
HCT: 40.3 % (ref 36.0–46.0)
Hemoglobin: 13.5 g/dL (ref 12.0–15.0)
MCHC: 33.5 g/dL (ref 30.0–36.0)
MCV: 86.5 fl (ref 78.0–100.0)
Platelets: 199 10*3/uL (ref 150.0–400.0)
RBC: 4.66 Mil/uL (ref 3.87–5.11)
RDW: 13.8 % (ref 11.5–15.5)
WBC: 9.1 10*3/uL (ref 4.0–10.5)

## 2018-12-05 LAB — LIPID PANEL
Cholesterol: 164 mg/dL (ref 0–200)
HDL: 58.2 mg/dL (ref >39.00)
LDL Cholesterol: 71 mg/dL (ref 0–99)
NonHDL: 106.08
Total CHOL/HDL Ratio: 3
Triglycerides: 173 mg/dL — ABNORMAL HIGH (ref 0.0–149.0)
VLDL: 34.6 mg/dL (ref 0.0–40.0)

## 2018-12-05 LAB — HEMOGLOBIN A1C: Hgb A1c MFr Bld: 6.3 % (ref 4.6–6.5)

## 2018-12-05 MED ORDER — IRBESARTAN 300 MG PO TABS: 300.0000 mg | ORAL_TABLET | Freq: Every day | ORAL | 3 refills | Status: DC

## 2018-12-05 MED ORDER — HYDROCHLOROTHIAZIDE 25 MG PO TABS: 25.0000 mg | ORAL_TABLET | Freq: Every day | ORAL | 3 refills | Status: DC

## 2018-12-05 NOTE — Patient Instructions (Addendum)
  Ms. Thies , Thank you for taking time to come for your Medicare Wellness Visit. I appreciate your ongoing commitment to your health goals. Please review the following plan we discussed and let me know if I can assist you in the future.   These are the goals we discussed: 1. We should have flu shots available by September. Please strongly consider getting flu shot this year. If you get your flu shot at a pharmacy- please let us know.  2. Need Tetanus shot if get cut/scrape- if you would like to get at present time- can get at your pharmacy.    This is a list of the screening recommended for you and due dates:  Health Maintenance  Topic Date Due  . Tetanus Vaccine  04/26/2015  . Flu Shot  11/24/2018  . DEXA scan (bone density measurement)  Completed  . Pneumonia vaccines  Completed

## 2018-12-05 NOTE — Progress Notes (Signed)
Phone: [phone]    Subjective:   Patient presents today for their annual wellness visit.    Preventive Screening-Counseling & Management  Smoking Status: Never Smoker Second Hand Smoking status: No smokers in home Alcohol intake: 0 per week  Risk Factors Regular exercise: twice a day! Diet: technically obese but at age would not recommend weight loss  Wt Readings from Last 3 Encounters:  12/05/18 164 lb (74.4 kg)  11/08/18 164 lb (74.4 kg)  10/11/18 160 lb (72.6 kg)   Fall Risk: fall back in February- tripped while carrying too much- counseling provided to avoid doing too much at once Fall Risk  12/05/2018 09/13/2018 08/08/2018 06/27/2018 05/30/2018  Falls in the past year? 1 0 1 1 -  Comment - - no recent fall - -  Number falls in past yr: 0 - 0 0 -  Injury with Fall? 1 - - 1 -  Risk for fall due to : - - Impaired balance/gait;History of fall(s) Impaired balance/gait;Impaired mobility History of fall(s)  Follow up - - - Education provided;Falls prevention discussed Falls evaluation completed;Education provided  Opioid use history:  no long term opioids use recently- had to use a few months before back surgery years ago  Cardiac risk factors:  advanced age (older than 79 for men, 41 for women)  treated Hyperlipidemia  treated Hypertension  High risk for diabetes- has had 1 a1c to 6.5 Lab Results  Component Value Date   HGBA1C 6.0 05/17/2018  Family History:  possible CAD in father  Depression Screen None. PHQ2 0  Depression screen [facility] 2/9 12/05/2018 08/08/2018 05/17/2018 01/05/2018 11/01/2017  Decreased Interest 0 0 0 0 0  Down, Depressed, Hopeless 0 0 0 0 0  PHQ - 2 Score 0 0 0 0 0  Altered sleeping - - - - 0  Tired, decreased energy - - - - 0  Change in appetite - - - - 0  Feeling bad or failure about yourself  - - - - 0  Trouble concentrating - - - - 0  Moving slowly or fidgety/restless - - - - 0  Suicidal thoughts - - - - 0  PHQ-9 Score - - - - 0  Difficult doing  work/chores - - - - Not difficult at all  Some recent data might be hidden    Activities of Daily Living Independent ADLs and IADLs   Hearing Difficulties: -patient states mild issues- has been tested in past but does not want hearing aids- if worsens she will reconsider  Has living will- sounds to be DNR- offered to look over living will in future if she likes  Cognitive Testing             No reported trouble. Kids say her memory is better than theirs!   Mini cog: normal clock draw. 2/3 delayed recall. Normal test result   [facility] baby  List the Names of Other Physician/Practitioners you currently use: -Dr. Posey Pronto for pain management for back pain -Dr. Neldon Mc allergies - Dr. Oneida Alar vascular surgery - Dr. Katy Fitch optho  Immunization History  Administered Date(s) Administered   Influenza Split 12/25/2011   Influenza Whole 01/23/2010   Influenza,inj,Quad PF,6+ Mos 12/31/2012, 12/26/2014, 01/08/2016   Influenza-Unspecified 01/23/2014, 12/25/2017   PPD Test 06/24/2011   Pneumococcal Conjugate-13 08/31/2015   Pneumococcal Polysaccharide-23 04/25/2005   Td 04/25/2005   Zoster 03/02/2010   Zoster Recombinat (Shingrix) 04/20/2017, 06/29/2017   Required Immunizations needed today - advise fall flu shot. Advised consider tetanus at  pharmacy Health Maintenance  Topic Date Due   Tetanus Vaccine  04/26/2015   Flu Shot  11/24/2018   DEXA scan (bone density measurement)  Completed   Pneumonia vaccines  Completed   Screening tests-  1. Colon cancer screening- passed age based screening- had polyps removed years ago and follow ups were ok 2. Lung Cancer screening- not a candidate as quit smoking long ago 3. Skin cancer screening- sees dermatology- they also follow for eczema- Dr. Elvera Lennox but last seen 4 years ago 4. Cervical cancer screening- passed age based screenings 5. Breast cancer screening- prefers to still get mammograms- these have been normal  ROS- No  pertinent positives discovered in course of AWV ROS problem oriented- No chest pain or shortness of breath. No headache or blurry vision. Does have claudication  The following were reviewed and entered/updated in epic: Past Medical History:  Diagnosis Date   Allergic rhinoconjunctivitis    Allergy    Anxiety    pt. managed- uses deep breathing    Arthritis    low back , stenosis   COLONIC POLYPS, RECURRENT 08/29/2006   2008 last colonoscopy. No further colonoscopy.      COPD (chronic obstructive pulmonary disease) (HCC)    Diverticulosis    Eczema    Environmental allergies    allergy shot- q friday in Dr. Janee Morn office. PFT's abnormal- recommended Spiriva to use preop & will d/c after surgery   GERD (gastroesophageal reflux disease)    Hiatal hernia    Hyperlipidemia    Hypertension    Lung nodule 2011   Peripheral vascular disease ([place])    Shingles    Stenosis of popliteal artery (HCC)    blood clots in legs long ago     Trigeminal neuralgia    Patient Active Problem List   Diagnosis Date Noted   Pain in right ankle and joints of right foot 08/28/2014    Priority: High   Peripheral arterial disease ([place]) 06/29/2007    Priority: High   CKD (chronic kidney disease), stage III ([place]) 08/07/2017    Priority: Medium   Acute gout of left foot 03/22/2016    Priority: Medium   Trigeminal neuralgia     Priority: Medium   Chronic low back pain 09/13/2010    Priority: Medium   COPD mixed type ([place]) 01/22/2010    Priority: Medium   Insulin resistance 01/06/2009    Priority: Medium   Hyperlipidemia 10/23/2006    Priority: Medium   Essential hypertension 10/23/2006    Priority: Medium   ESOPHAGEAL STRICTURE 08/09/1993    Priority: Medium   Obesity (BMI 30.0-34.9) 11/14/2017    Priority: Low   Unilateral primary osteoarthritis, right knee 04/07/2016    Priority: Low   Encounter for therapeutic drug monitoring 10/21/2013    Priority: Low    Syncope 12/29/2011    Priority: Low   Allergic rhinitis due to pollen 06/25/2010    Priority: Low   Solitary pulmonary nodule 01/12/2010    Priority: Low   Osteopenia 01/06/2009    Priority: Low   Eczema 11/26/2007    Priority: Low   Myalgia 08/08/2018   Chronic pain syndrome 06/27/2018   Gross hematuria 06/01/2018   Failed back syndrome 09/28/2017   Long term (current) use of anticoagulants 02/03/2017   Past Surgical History:  Procedure Laterality Date   ABDOMINAL AORTAGRAM N/A 06/17/2011   Procedure: ABDOMINAL Maxcine Ham;  Surgeon: Elam Dutch, MD;  Location: [facility] CATH LAB;  Service: Cardiovascular;  Laterality:  N/A;   ABDOMINAL AORTOGRAM W/LOWER EXTREMITY Bilateral 06/22/2018   Procedure: ABDOMINAL AORTOGRAM W/LOWER EXTREMITY;  Surgeon: Elam Dutch, MD;  Location: [place] CV LAB;  Service: Cardiovascular;  Laterality: Bilateral;   CATARACT EXTRACTION, BILATERAL     w IOL   CHOLECYSTECTOMY  1998   FEMORAL-POPLITEAL BYPASS GRAFT        x2 surgeries 1990's & 2009   INJECTION KNEE Right Aug. 2016   Gel injection for pain   LUMBAR FUSION  07/06/2011   TONSILLECTOMY     as a teenager    TUBAL LIGATION      Family History  Problem Relation Age of Onset   Arthritis Mother    Hypertension Mother    Heart disease Father    Colon polyps Father    Breast cancer Sister    Breast cancer Daughter    Hypertension Son    Lung cancer Brother    Colon cancer Sister    Brain cancer Sister    Lung cancer Sister    Anesthesia problems Neg Hx    Hypotension Neg Hx    Malignant hyperthermia Neg Hx    Pseudochol deficiency Neg Hx     Medications- reviewed and updated Current Outpatient Medications  Medication Sig Dispense Refill   acetaminophen (TYLENOL) 500 MG tablet Take 1,000 mg by mouth 2 (two) times daily. Reported on 08/31/2015     aspirin EC 81 MG tablet Take 81 mg by mouth every evening.      baclofen (LIORESAL) 10 MG tablet Take  1 tablet by mouth twice daily 180 tablet 0   colchicine (COLCRYS) 0.6 MG tablet Take 2 pills at first sign gout flare, then another pill 2 hours later if still having pain. Then take once daily until pain resolves. (Patient taking differently: Take 0.6-1.2 mg by mouth See admin instructions. Take 1.2 mg at first sign gout flare, then 0.6 mg 2 hours later if still having pain. Then take 0.6 mg once daily until pain resolves.) 30 tablet 0   hydrochlorothiazide (HYDRODIURIL) 25 MG tablet Take 1 tablet (25 mg total) by mouth at bedtime. 90 tablet 3   ipratropium (ATROVENT) 0.06 % nasal spray INHALE 2 SPRAYS INTO BOTH NOSTRILS THREE TIMES A DAY 15 mL 8   irbesartan (AVAPRO) 300 MG tablet Take 1 tablet (300 mg total) by mouth daily. 90 tablet 3   levocetirizine (XYZAL) 5 MG tablet Take 5 mg by mouth every evening.     Multiple Vitamin (MULTIVITAMIN WITH MINERALS) TABS tablet Take 1 tablet by mouth daily.     pantoprazole (PROTONIX) 40 MG tablet Take 1 tablet by mouth twice daily 60 tablet 5   RESTASIS 0.05 % ophthalmic emulsion Place 1 drop into both eyes 2 (two) times daily.   99   simvastatin (ZOCOR) 40 MG tablet TAKE 1 TABLET BY MOUTH ONCE DAILY AT 6PM (Patient taking differently: Take 40 mg by mouth daily at 6 PM. ) 90 tablet 2   warfarin (COUMADIN) 5 MG tablet TAKE 2 TABLETS BY MOUTH ONCE DAILY, EXCEPT TAKE 2 & 1/2 TABLETS ON THURSDAY 180 tablet 0   No current facility-administered medications for this visit.     Allergies-reviewed and updated Allergies  Allergen Reactions   Sulfa Antibiotics Other (See Comments)    Cold sweat light headed and disorientation   Tiotropium Bromide Shortness Of Breath and Other (See Comments)    Sore throat also   Ultram [Tramadol] Nausea And Vomiting   Latex Itching and  Rash    Social History   Socioeconomic History   Marital status: Single    Spouse name: Not on file   Number of children: 4   Years of education: 12   Highest education  level: Not on file  Occupational History   Occupation: retired    Fish farm manager: RETIRED  Scientist, product/process development strain: Not on file   Food insecurity    Worry: Not on file    Inability: Not on file   Transportation needs    Medical: Not on file    Non-medical: Not on file  Tobacco Use   Smoking status: Former Smoker    Packs/day: 2.00    Years: 30.00    Pack years: 60.00    Types: Cigarettes    Quit date: 06/18/1993    Years since quitting: 25.4   Smokeless tobacco: Never Used   Tobacco comment: [place]  Substance and Sexual Activity   Alcohol use: No    Alcohol/week: 0.0 standard drinks   Drug use: No   Sexual activity: Not on file  Lifestyle   Physical activity    Days per week: Not on file    Minutes per session: Not on file   Stress: Not on file  Relationships   Social connections    Talks on phone: Not on file    Gets together: Not on file    Attends religious service: Not on file    Active member of club or organization: Not on file    Attends meetings of clubs or organizations: Not on file    Relationship status: Not on file  Other Topics Concern   Not on file  Social History Narrative   Patient is widowed with 4 children. 2 grandkids. Lives alone.    Patient is right handed.   Patient has high school education.   Patient drinks 1 cup daily.      Retired from [employer] for 28 years, works 2 days a week until 2016.       Hobbies: volunteers at [employer], [place] from church visits people.          Objective:  BP 120/62 (BP Location: Left Arm, Patient Position: Sitting, Cuff Size: Normal)    Pulse 71    Temp 97.8 F (36.6 C) (Oral)    Ht 4\' 11"  (1.499 m)    Wt 164 lb (74.4 kg)    SpO2 96%    BMI 33.12 kg/m  Gen: NAD, resting comfortably HEENT: Mucous membranes are moist. Oropharynx normal Neck: no thyromegaly CV: RRR no murmurs rubs or gallops Lungs: CTAB no crackles, wheeze, rhonchi Abdomen:  soft/nontender/nondistended/normal bowel sounds. No rebound or guarding.  Ext: no edema, very weak DP pulse on right, 1+ on left Skin: warm, dry Neuro: grossly normal, moves all extremities, PERRLA , gets onto table without assist.    Assessment/Plan:  AWV completed- discussed recommended screenings and documented any personalized health advice and referrals for preventive counseling. See AVS as well which was given to patient.   Status of chronic or acute concerns   #Hyperglycemia/prediabetes/insulin resistance/obesity S: Had plan to diagnose diabetes but had no A1c 6.5 or above-fortunately she had a significant decrease on last visit-she prefers not to use metformin. Weight is down 1 pound from January visit  She is working out twice a day- leg exercises/stretches before gets out of bed- 20 mins then in evening does bicycling with feet and doing 5 lbs weight Lab  Results  Component Value Date   HGBA1C 6.0 05/17/2018   HGBA1C 6.5 08/07/2017   HGBA1C 6.2 06/22/2016   A/P: hopefully a1c has improved with exercise- update with labs For obesity- Encouraged need for healthy eating, regular exercise, weight loss.     #hyperlipidemia/PAD S: Well controlled on simvastatin 40 mg.  LDL goal under 70 Peripheral arterial disease- she is on warfarin through Dr. Oneida Alar.  She is also compliant with aspirin.  Continues to get claudication in her calf- known bypass graft narrowing mid segment- she has some options for either general anesthesia or local- but with 6 hour surgery is concerned about local and tolerating a procedure that long.  Lab Results  Component Value Date   CHOL 125 08/07/2017   HDL 47.50 08/07/2017   LDLCALC 41 08/07/2017   LDLDIRECT 48.0 05/17/2018   TRIG 181.0 (H) 08/07/2017   CHOLHDL 3 08/07/2017   A/P: we discussed PAD- she would like to continue to hold off on surgery unless progressively affecting her lifestyle  For lipids- likely continue simvastatin- update lipids. Last  LDL under 50 so dont want to increase #s   #hypertension/CKD stage III/gout S: controlled on irbesartan 300 mg and hydrochlorothiazide 25 mg.  May need to stop hydrochlorothiazide if future gout flares- no recent issues  GFR stable primarily in the 50s.  Knows to avoid NSAIDs-only takes Tylenol BP Readings from Last 3 Encounters:  12/05/18 120/62  11/08/18 116/72  10/23/18 140/66  A/P: Stable blood pressure today-well-controlled-continue current medications.  CKD stage III hopefully stable-update BMP - no gout flares   #COPD S: Quit smoking in the 90s but over 35 pack years.  No regular use of inhalers.  Feels like she does better when she exercises- she is doing twice a day A/P: Stable. Continue without meds  Recommended follow up: 6 months Future Appointments  Date Time Provider [place]  12/07/2018  1:30 PM [facility]  04/16/2019 11:40 AM Jamse Arn, MD [facility]  10/15/2019 11:00 AM Kozlow, Donnamarie Poag, MD [facility] None   Lab/Order associations:  Not fasting- oatmeal instant, black coffee   ICD-10-CM   1. CKD (chronic kidney disease), stage III (HCC)  N18.3 CBC    Comprehensive metabolic panel  2. Preventative health care  Z00.00   3. Hyperlipidemia, unspecified hyperlipidemia type  E78.5 CBC    Comprehensive metabolic panel    Lipid panel  4. Essential hypertension  I10   5. COPD mixed type ([place])  J44.9   6. Peripheral arterial disease (HCC)  I73.9   7. Obesity (BMI 30-39.9)  E66.9   8. Hyperglycemia  R73.9 Hemoglobin A1c    Meds ordered this encounter  Medications   hydrochlorothiazide (HYDRODIURIL) 25 MG tablet    Sig: Take 1 tablet (25 mg total) by mouth at bedtime.    Dispense:  90 tablet    Refill:  3   irbesartan (AVAPRO) 300 MG tablet    Sig: Take 1 tablet (300 mg total) by mouth daily.    Dispense:  90 tablet    Refill:  3   Return precautions advised. [name], MD

## 2018-12-07 ENCOUNTER — Ambulatory Visit (INDEPENDENT_AMBULATORY_CARE_PROVIDER_SITE_OTHER): Payer: Medicare Other | Admitting: General Practice

## 2018-12-07 ENCOUNTER — Other Ambulatory Visit: Payer: Self-pay

## 2018-12-07 DIAGNOSIS — Z7901 Long term (current) use of anticoagulants: Secondary | ICD-10-CM | POA: Diagnosis not present

## 2018-12-07 LAB — POCT INR: INR: 3 (ref 2.0–3.0)

## 2018-12-07 MED ORDER — ATORVASTATIN CALCIUM 40 MG PO TABS: 40.0000 mg | ORAL_TABLET | Freq: Every day | ORAL | 3 refills | Status: DC

## 2018-12-07 NOTE — Patient Instructions (Addendum)
Pre visit review using our clinic review tool, if applicable. No additional management support is needed unless otherwise documented below in the visit note.  Continue to take 1 tablet daily except 1 1/2 tablets on Mondays.   Re-check in 4 weeks.

## 2018-12-11 ENCOUNTER — Telehealth: Payer: Self-pay | Admitting: Family Medicine

## 2018-12-11 DIAGNOSIS — G500 Trigeminal neuralgia: Secondary | ICD-10-CM

## 2018-12-11 NOTE — Telephone Encounter (Signed)
Patient needs to be triaged as soon as possible. Sending a message and I verbally told Dr. Ansel Bong team.  Copied from [place] [id number]. Topic: General - Other >> Dec 11, 2018  1:16 PM Celene Kras A wrote: Reason for CRM: Pt called stating she is needing a referral for [facility]. Pt states that she is experiencing pain in the left side of her face, sporadic, shooting, possible ruptured blood vessel. Pt states this happened in the other side of her face in 2017.Please advise.

## 2018-12-11 NOTE — Addendum Note (Signed)
Addended by: Jasper Loser on: 12/11/2018 02:45 PM   Modules accepted: Orders

## 2018-12-11 NOTE — Telephone Encounter (Signed)
Called and spoke with patient. She reports left-sided cheek pain x 2 days, worse at night. Denies jaw pain, HA, dizziness, CP, SOB, palor, increased sweating or anxiety, radiating pain to upper extremities, slurred speech, facial drooping.   Consulted with Dr. Yong Channel, advised OK to place STAT referral to neurology. Referral has been placed. Pt aware.

## 2018-12-31 DIAGNOSIS — K1120 Sialoadenitis, unspecified: Secondary | ICD-10-CM | POA: Diagnosis not present

## 2019-01-07 NOTE — Progress Notes (Signed)
NEUROLOGY CONSULTATION NOTE  [name] [name] MRN: [id number] DOB: [birth year]  Referring provider: [name], MD Primary care provider: [name], MD  Reason for consult:  Left sided trigeminal neuralgia  HISTORY OF PRESENT ILLNESS: [name] [name] is a 85 year old female with COPD and CKD and history of DVTs on chronic Coumadin who presents for left-sided trigeminal neuralgia.  History supplemented by prior neurologist and referring provider notes.  She was diagnosed with right-sided trigeminal neuralgia in 2016.  She was treated at that time. She was treated with carbamazepine which was stopped due to interference with Coumadin and later gabapentin.  It subsequently resolved.  5 weeks ago, she began having left sided facial.  This is worse than the previous episode.  It mostly involves the cheek.  It is sometimes a paroxysmal stabbing pain or a constant sharp electric pain.  It is daily.  It gets worse later in the day.  Movement aggravates it.  She cannot sleep, talk on the phone or eat.  Staying still helps ease it.  No numbness or tingling.  No facial weakness.  No associated rash.  No change in hearing or vision.    Current medication:  Baclofen 10mg  twice daily (for back pain) Past medications:  Gabapentin (unable to tolerate), carbamazepine (interfered with Coumadin), Cymbalta (not used for this), tramadol (caused nausea)  Na level from August was 138.  PAST MEDICAL HISTORY: Past Medical History:  Diagnosis Date  . Allergic rhinoconjunctivitis   . Allergy   . Anxiety    pt. managed- uses deep breathing   . Arthritis    low back , stenosis  . COLONIC POLYPS, RECURRENT 08/29/2006   2008 last colonoscopy. No further colonoscopy.     Marland Kitchen COPD (chronic obstructive pulmonary disease) ([place])   . Diverticulosis   . Eczema   . Environmental allergies    allergy shot- q friday in Dr. Janee Morn office. PFT's abnormal- recommended Spiriva to use preop & will d/c after surgery   . GERD (gastroesophageal reflux disease)   . Hiatal hernia   . Hyperlipidemia   . Hypertension   . Lung nodule 2011  . Peripheral vascular disease ([place])   . Shingles   . Stenosis of popliteal artery ([facility])    blood clots in legs long ago    . Trigeminal neuralgia     PAST SURGICAL HISTORY: Past Surgical History:  Procedure Laterality Date  . ABDOMINAL AORTAGRAM N/A 06/17/2011   Procedure: ABDOMINAL Maxcine Ham;  Surgeon: Elam Dutch, MD;  Location: [facility] CATH LAB;  Service: Cardiovascular;  Laterality: N/A;  . ABDOMINAL AORTOGRAM W/LOWER EXTREMITY Bilateral 06/22/2018   Procedure: ABDOMINAL AORTOGRAM W/LOWER EXTREMITY;  Surgeon: Elam Dutch, MD;  Location: [facility] CV LAB;  Service: Cardiovascular;  Laterality: Bilateral;  . CATARACT EXTRACTION, BILATERAL     w IOL  . CHOLECYSTECTOMY  1998  . FEMORAL-POPLITEAL BYPASS GRAFT        x2 surgeries 1990's & 2009  . INJECTION KNEE Right Aug. 2016   Gel injection for pain  . LUMBAR FUSION  07/06/2011  . TONSILLECTOMY     as a teenager   . TUBAL LIGATION      MEDICATIONS: Current Outpatient Medications on File Prior to Visit  Medication Sig Dispense Refill  . acetaminophen (TYLENOL) 500 MG tablet Take 1,000 mg by mouth 2 (two) times daily. Reported on 08/31/2015    . aspirin EC 81 MG tablet Take 81 mg by mouth every evening.     Marland Kitchen  atorvastatin (LIPITOR) 40 MG tablet Take 1 tablet (40 mg total) by mouth daily. 90 tablet 3  . baclofen (LIORESAL) 10 MG tablet Take 1 tablet by mouth twice daily 180 tablet 0  . colchicine (COLCRYS) 0.6 MG tablet Take 2 pills at first sign gout flare, then another pill 2 hours later if still having pain. Then take once daily until pain resolves. (Patient taking differently: Take 0.6-1.2 mg by mouth See admin instructions. Take 1.2 mg at first sign gout flare, then 0.6 mg 2 hours later if still having pain. Then take 0.6 mg once daily until pain resolves.) 30 tablet 0  . hydrochlorothiazide (HYDRODIURIL)  25 MG tablet Take 1 tablet (25 mg total) by mouth at bedtime. 90 tablet 3  . ipratropium (ATROVENT) 0.06 % nasal spray INHALE 2 SPRAYS INTO BOTH NOSTRILS THREE TIMES A DAY 15 mL 8  . irbesartan (AVAPRO) 300 MG tablet Take 1 tablet (300 mg total) by mouth daily. 90 tablet 3  . levocetirizine (XYZAL) 5 MG tablet Take 5 mg by mouth every evening.    . Multiple Vitamin (MULTIVITAMIN WITH MINERALS) TABS tablet Take 1 tablet by mouth daily.    . pantoprazole (PROTONIX) 40 MG tablet Take 1 tablet by mouth twice daily 60 tablet 5  . RESTASIS 0.05 % ophthalmic emulsion Place 1 drop into both eyes 2 (two) times daily.   99  . warfarin (COUMADIN) 5 MG tablet TAKE 2 TABLETS BY MOUTH ONCE DAILY, EXCEPT TAKE 2 & 1/2 TABLETS ON THURSDAY 180 tablet 0   No current facility-administered medications on file prior to visit.     ALLERGIES: Allergies  Allergen Reactions  . Sulfa Antibiotics Other (See Comments)    Cold sweat light headed and disorientation  . Tiotropium Bromide Shortness Of Breath and Other (See Comments)    Sore throat also  . Ultram [Tramadol] Nausea And Vomiting  . Latex Itching and Rash    FAMILY HISTORY: Family History  Problem Relation Age of Onset  . Arthritis Mother   . Hypertension Mother   . Heart disease Father   . Colon polyps Father   . Breast cancer Sister   . Breast cancer Daughter   . Hypertension Son   . Lung cancer Brother   . Colon cancer Sister   . Brain cancer Sister   . Lung cancer Sister   . Anesthesia problems Neg Hx   . Hypotension Neg Hx   . Malignant hyperthermia Neg Hx   . Pseudochol deficiency Neg Hx    SOCIAL HISTORY: Social History   Socioeconomic History  . Marital status: Single    Spouse name: Not on file  . Number of children: 4  . Years of education: 97  . Highest education level: Not on file  Occupational History  . Occupation: retired    Fish farm manager: RETIRED  Social Needs  . Financial resource strain: Not on file  . Food insecurity     Worry: Not on file    Inability: Not on file  . Transportation needs    Medical: Not on file    Non-medical: Not on file  Tobacco Use  . Smoking status: Former Smoker    Packs/day: 2.00    Years: 30.00    Pack years: 60.00    Types: Cigarettes    Quit date: 06/18/1993    Years since quitting: 25.5  . Smokeless tobacco: Never Used  . Tobacco comment: QUIT IN 1995  Substance and Sexual Activity  . Alcohol use:  No    Alcohol/week: 0.0 standard drinks  . Drug use: No  . Sexual activity: Not on file  Lifestyle  . Physical activity    Days per week: Not on file    Minutes per session: Not on file  . Stress: Not on file  Relationships  . Social Herbalist on phone: Not on file    Gets together: Not on file    Attends religious service: Not on file    Active member of club or organization: Not on file    Attends meetings of clubs or organizations: Not on file    Relationship status: Not on file  . Intimate partner violence    Fear of current or ex partner: Not on file    Emotionally abused: Not on file    Physically abused: Not on file    Forced sexual activity: Not on file  Other Topics Concern  . Not on file  Social History Narrative   Patient is widowed with 4 children. 2 grandkids. Lives alone.    Patient is right handed.   Patient has high school education.   Patient drinks 1 cup daily.      Retired from [employer] for 28 years, works 2 days a week until 2016.       Hobbies: volunteers at [employer], [place] from church visits people.        REVIEW OF SYSTEMS: Constitutional: No fevers, chills, or sweats, no generalized fatigue, change in appetite Eyes: No visual changes, double vision, eye pain Ear, nose and throat: No hearing loss, ear pain, nasal congestion, sore throat Cardiovascular: No chest pain, palpitations Respiratory:  No shortness of breath at rest or with exertion, wheezes GastrointestinaI: No nausea, vomiting, diarrhea,  abdominal pain, fecal incontinence Genitourinary:  No dysuria, urinary retention or frequency Musculoskeletal: back pain Integumentary: No rash, pruritus, skin lesions Neurological: as above Psychiatric: No depression, insomnia, anxiety Endocrine: No palpitations, fatigue, diaphoresis, mood swings, change in appetite, change in weight, increased thirst Hematologic/Lymphatic:  No purpura, petechiae. Allergic/Immunologic: no itchy/runny eyes, nasal congestion, recent allergic reactions, rashes  PHYSICAL EXAM: Blood pressure (!) 159/69, pulse 72, temperature (!) 97.4 F (36.3 C), height 4\' 11"  (1.499 m), weight 165 lb (74.8 kg), SpO2 96 %. General: No acute distress.  Patient appears well-groomed.  Head:  Normocephalic/atraumatic Eyes:  fundi examined but not visualized Neck: supple, no paraspinal tenderness, full range of motion Back: No paraspinal tenderness Heart: regular rate and rhythm Lungs: Clear to auscultation bilaterally. Vascular: No carotid bruits. Neurological Exam: Mental status: alert and oriented to person, place, and time, recent and remote memory intact, fund of knowledge intact, attention and concentration intact, speech fluent and not dysarthric, language intact. Cranial nerves: CN I: not tested CN II: pupils equal, round and reactive to light, visual fields intact CN III, IV, VI:  full range of motion, no nystagmus, no ptosis CN V: facial sensation intact CN VII: upper and lower face symmetric CN VIII: hearing intact CN IX, X: gag intact, uvula midline CN XI: sternocleidomastoid and trapezius muscles intact CN XII: tongue midline Bulk & Tone: normal, no fasciculations. Motor:  5/5 throughout.  Slight tremor in hands (right greater than left) Sensation:  temperature and vibration sensation intact. Deep Tendon Reflexes:  1+ throughout, toes downgoing.   Finger to nose testing:  Without dysmetria.   Heel to shin:  Without dysmetria.   Gait:  Cautious gait.   Romberg negative.  IMPRESSION: Left sided trigeminal  neuralgia  PLAN: 1.  Initiate oxcarbazepine.  It does not appear to affect warfarin levels like carbamazepine.  150mg  at bedtime for one week, then 150mg  twice daily.  At end of prescription, she will contact me with update for refill and whether we need to increase dose.  Discussed side effects such as dizziness and hyponatremia. 2.  Monitor Na:  Check BMP today, in 6 weeks and again one week prior to follow up. 3.  On baclofen 10mg  twice daily for back pain, which we can consider increasing in the future as it is also used to treat trigeminal neuralgia. 4.  Follow up in 4 months.  Thank you for allowing me to take part in the care of this patient.  Metta Clines, DO  CC: [name], MD

## 2019-01-10 ENCOUNTER — Encounter

## 2019-01-10 ENCOUNTER — Other Ambulatory Visit: Payer: Self-pay

## 2019-01-10 ENCOUNTER — Encounter: Payer: Self-pay | Admitting: Neurology

## 2019-01-10 ENCOUNTER — Ambulatory Visit (INDEPENDENT_AMBULATORY_CARE_PROVIDER_SITE_OTHER): Payer: Medicare Other | Admitting: Neurology

## 2019-01-10 ENCOUNTER — Other Ambulatory Visit (INDEPENDENT_AMBULATORY_CARE_PROVIDER_SITE_OTHER): Payer: Medicare Other

## 2019-01-10 VITALS — BP 159/69 | HR 72 | Temp 97.4°F | Ht 59.0 in | Wt 165.0 lb

## 2019-01-10 DIAGNOSIS — Z79899 Other long term (current) drug therapy: Secondary | ICD-10-CM | POA: Diagnosis not present

## 2019-01-10 DIAGNOSIS — G500 Trigeminal neuralgia: Secondary | ICD-10-CM | POA: Diagnosis not present

## 2019-01-10 DIAGNOSIS — I779 Disorder of arteries and arterioles, unspecified: Secondary | ICD-10-CM

## 2019-01-10 LAB — BASIC METABOLIC PANEL
BUN: 31 mg/dL — ABNORMAL HIGH (ref 6–23)
CO2: 29 mEq/L (ref 19–32)
Calcium: 9.4 mg/dL (ref 8.4–10.5)
Chloride: 104 mEq/L (ref 96–112)
Creatinine, Ser: 1.14 mg/dL (ref 0.40–1.20)
GFR: 44.56 mL/min — ABNORMAL LOW (ref >60.00)
Glucose, Bld: 110 mg/dL — ABNORMAL HIGH (ref 70–99)
Potassium: 4.9 mEq/L (ref 3.5–5.1)
Sodium: 138 mEq/L (ref 135–145)

## 2019-01-10 MED ORDER — OXCARBAZEPINE 150 MG PO TABS: ORAL_TABLET | ORAL | 0 refills | Status: DC

## 2019-01-10 NOTE — Patient Instructions (Signed)
I do think you now have trigeminal neuralgia affecting the LEFT side of her face.  1.  Start oxcarbazepine 150mg  tablet:  Take 1 tablet at bedtime for 7 days,  Then 1 tablet twice daily  When you have about 4 or 5 days left in your prescription, contact me with update and I will refill current dose or increase dose.  Side effects may include dizziness or unsteady walking.     We will need to monitor your sodium levels because it can lower your sodium.   2.  We will check labs (basic metabolic panel).  Check today.  I want to repeat in 6 weeks and again about a week prior to your follow up with me. 3.  Follow up in 4 months.    Trigeminal Neuralgia  Trigeminal neuralgia is a nerve disorder that causes severe pain on one side of the face. The pain may last from a few seconds to several minutes. The pain is usually only on one side of the face. Symptoms may occur for days, weeks, or months and then go away for months or years. The pain may return and be worse than before. What are the causes? This condition is caused by damage or pressure to a nerve in the head that is called the trigeminal nerve. An attack can be triggered by:  Talking.  Chewing.  Putting on makeup.  Washing your face.  Shaving your face.  Brushing your teeth.  Touching your face. What increases the risk? You are more likely to develop this condition if you:  Are 39 years of age or older.  Are female. What are the signs or symptoms? The main symptom of this condition is severe pain in the:  Jaw.  Lips.  Eyes.  Nose.  Scalp.  Forehead.  Face. The pain may be:  Intense.  Stabbing.  Electric.  Shock-like. How is this diagnosed? This condition is diagnosed with a physical exam. A CT scan or an MRI may be done to rule out other conditions that can cause facial pain. How is this treated? This condition may be treated with:  Avoiding the things that trigger your symptoms.  Taking  prescription medicines (anticonvulsants).  Having surgery. This may be done in severe cases if other medical treatment does not provide relief.  Having procedures such as ablation, thermal, or radiation therapy. It may take up to one month for treatment to start relieving the pain. Follow these instructions at home: Managing pain  Learn as much as you can about how to manage your pain. Ask your health care provider if a pain specialist would be helpful.  Consider talking with a mental health care provider (psychologist) about how to cope with the pain.  Consider joining a pain support group. General instructions  Take over-the-counter and prescription medicines only as told by your health care provider.  Avoid the things that trigger your symptoms. It may help to: ? Chew on the unaffected side of your mouth. ? Avoid touching your face. ? Avoid blasts of hot or cold air.  Follow your treatment plan as told by your health care provider. This may include: ? Cognitive or behavioral therapy. ? Gentle, regular exercise. ? Meditation or yoga. ? Aromatherapy.  Keep all follow-up visits as told by your health care provider. You may need to be monitored closely to make sure treatment is working well for you. Where to find more information  Facial Pain Association: fpa-support.org Contact a health care provider if:  Your medicine is not helping your symptoms.  You have side effects from the medicine used for treatment.  You develop new, unexplained symptoms, such as: ? Double vision. ? Facial weakness. ? Facial numbness. ? Changes in hearing or balance.  You feel depressed. Get help right away if:  Your pain is severe and is not getting better.  You develop suicidal thoughts. If you ever feel like you may hurt yourself or others, or have thoughts about taking your own life, get help right away. You can go to your nearest emergency department or call:  Your local emergency  services (911 in the [place]  A suicide crisis helpline, such as the [facility] at (817)615-6296. This is open 24 hours a day. Summary  Trigeminal neuralgia is a nerve disorder that causes severe pain on one side of the face. The pain may last from a few seconds to several minutes.  This condition is caused by damage or pressure to a nerve in the head that is called the trigeminal nerve.  Treatment may include avoiding the things that trigger your symptoms, taking medicines, or having surgery or procedures. It may take up to one month for treatment to start relieving the pain.  Avoid the things that trigger your symptoms.  Keep all follow-up visits as told by your health care provider. You may need to be monitored closely to make sure treatment is working well for you. This information is not intended to replace advice given to you by your health care provider. Make sure you discuss any questions you have with your health care provider. Document Released: 04/08/2000 Document Revised: 02/26/2018 Document Reviewed: 02/26/2018 Elsevier Patient Education  2020 [employer].

## 2019-01-10 NOTE — Addendum Note (Signed)
Addended by: Jesse Fall on: 01/10/2019 10:13 AM   Modules accepted: Orders

## 2019-01-11 ENCOUNTER — Ambulatory Visit (INDEPENDENT_AMBULATORY_CARE_PROVIDER_SITE_OTHER): Payer: Medicare Other | Admitting: General Practice

## 2019-01-11 DIAGNOSIS — Z7901 Long term (current) use of anticoagulants: Secondary | ICD-10-CM

## 2019-01-11 LAB — POCT INR: INR: 3.5 — AB (ref 2.0–3.0)

## 2019-01-11 NOTE — Progress Notes (Signed)
Medical screening examination/treatment/procedure(s) were performed by non-physician practitioner and as supervising physician I was immediately available for consultation/collaboration. I agree with above. Ai Sonnenfeld, MD   

## 2019-01-11 NOTE — Patient Instructions (Addendum)
Pre visit review using our clinic review tool, if applicable. No additional management support is needed unless otherwise documented below in the visit note.  Skip dosage today and then continue to take 1 tablet daily except 1 1/2 tablets on Mondays.   Re-check in 4 weeks.

## 2019-01-14 DIAGNOSIS — Z23 Encounter for immunization: Secondary | ICD-10-CM | POA: Diagnosis not present

## 2019-02-08 ENCOUNTER — Other Ambulatory Visit: Payer: Self-pay

## 2019-02-08 ENCOUNTER — Ambulatory Visit (INDEPENDENT_AMBULATORY_CARE_PROVIDER_SITE_OTHER): Payer: Medicare Other | Admitting: General Practice

## 2019-02-08 DIAGNOSIS — Z7901 Long term (current) use of anticoagulants: Secondary | ICD-10-CM

## 2019-02-08 LAB — POCT INR: INR: 2.4 (ref 2.0–3.0)

## 2019-02-08 NOTE — Patient Instructions (Signed)
Pre visit review using our clinic review tool, if applicable. No additional management support is needed unless otherwise documented below in the visit note.  Continue to take 1 tablet daily except 1 1/2 tablets on Mondays.   Re-check in 4 weeks.  

## 2019-02-08 NOTE — Progress Notes (Signed)
Medical screening examination/treatment/procedure(s) were performed by non-physician practitioner and as supervising physician I was immediately available for consultation/collaboration. I agree with above. Makell Cyr, MD   

## 2019-02-15 ENCOUNTER — Telehealth: Payer: Self-pay | Admitting: Neurology

## 2019-02-15 ENCOUNTER — Other Ambulatory Visit: Payer: Self-pay | Admitting: Neurology

## 2019-02-15 MED ORDER — LAMOTRIGINE 25 MG PO TABS: ORAL_TABLET | ORAL | 0 refills | Status: DC

## 2019-02-15 NOTE — Telephone Encounter (Addendum)
Patient called to give an update on her medication, oxcarbazepine 150 MG. She said it makes her sluggish, and stager a little, and it has given no improvement with her pain. She said she is in pain constantly and as a result can't eat or sleep because of it. She said she also feels very depressed at this time.  Patient requesting another alternative to help with her pain.  [name]

## 2019-02-15 NOTE — Telephone Encounter (Signed)
1.  She should discontinue oxcarbazepine. 2.  We will start lamotrigine: 25mg  tablets:  She will take 25mg  at bedtime for 2 weeks;   Then 25mg  twice daily for 2 weeks,   Then 50mg  twice daily.   I will enter the prescription. Rarely, this medication may be associated with a severe rash when starting it, so she should monitor for any new or unusual rash and contact us. 3.  Continue baclofen 10mg  twice daily.

## 2019-02-15 NOTE — Telephone Encounter (Signed)
No answer at 409

## 2019-02-15 NOTE — Telephone Encounter (Signed)
See below

## 2019-02-18 NOTE — Telephone Encounter (Signed)
Apple I tried to do this Friday afternoon, please see note I will try to call again

## 2019-02-18 NOTE — Telephone Encounter (Signed)
Called patient she was informed of provider response below and has started Rx. She will call if she has any more questions or concerns

## 2019-02-27 ENCOUNTER — Telehealth: Payer: Self-pay | Admitting: Orthopaedic Surgery

## 2019-02-27 NOTE — Telephone Encounter (Signed)
Please submit for gel injection. Thank you.  

## 2019-02-27 NOTE — Telephone Encounter (Signed)
Patient called. She would like to get the Synvisc One injection again. Her call back number is [phone]

## 2019-02-27 NOTE — Telephone Encounter (Signed)
See message below. Last Gel injection given 08/24/2018.

## 2019-02-27 NOTE — Telephone Encounter (Signed)
Yes that's fine.  Let's submit for it please

## 2019-02-28 ENCOUNTER — Other Ambulatory Visit: Payer: Self-pay | Admitting: Family Medicine

## 2019-02-28 ENCOUNTER — Other Ambulatory Visit: Payer: Self-pay | Admitting: Physical Medicine & Rehabilitation

## 2019-02-28 ENCOUNTER — Other Ambulatory Visit: Payer: Self-pay

## 2019-02-28 ENCOUNTER — Other Ambulatory Visit: Payer: Medicare Other

## 2019-02-28 DIAGNOSIS — Z7901 Long term (current) use of anticoagulants: Secondary | ICD-10-CM

## 2019-02-28 DIAGNOSIS — Z79899 Other long term (current) drug therapy: Secondary | ICD-10-CM

## 2019-02-28 DIAGNOSIS — G500 Trigeminal neuralgia: Secondary | ICD-10-CM

## 2019-02-28 LAB — BASIC METABOLIC PANEL
BUN: 21 mg/dL (ref 6–23)
CO2: 27 mEq/L (ref 19–32)
Calcium: 8.7 mg/dL (ref 8.4–10.5)
Chloride: 108 mEq/L (ref 96–112)
Creatinine, Ser: 1.1 mg/dL (ref 0.40–1.20)
GFR: 46.42 mL/min — ABNORMAL LOW (ref >60.00)
Glucose, Bld: 121 mg/dL — ABNORMAL HIGH (ref 70–99)
Potassium: 4.2 mEq/L (ref 3.5–5.1)
Sodium: 142 mEq/L (ref 135–145)

## 2019-03-04 NOTE — Telephone Encounter (Signed)
Submitted online [email or web address].

## 2019-03-05 ENCOUNTER — Other Ambulatory Visit: Payer: Self-pay | Admitting: Family Medicine

## 2019-03-05 DIAGNOSIS — Z7901 Long term (current) use of anticoagulants: Secondary | ICD-10-CM

## 2019-03-05 NOTE — Telephone Encounter (Signed)
Can you please call patient and schedule Synvisc One right knee, with Dr [name]?  Please tell her that insurance should cover all costs.  Thanks!  Buy and bill ok, no PA needed.

## 2019-03-05 NOTE — Telephone Encounter (Signed)
Medication Refill - Medication: warfarin (COUMADIN) 5 MG tablet [id number]      Preferred Pharmacy (with phone number or street name):  [facility], [address]  [place] [address]  Phone: [phone] Fax: [phone]     Agent: Please be advised that RX refills may take up to 3 business days. We ask that you follow-up with your pharmacy.

## 2019-03-08 ENCOUNTER — Ambulatory Visit (INDEPENDENT_AMBULATORY_CARE_PROVIDER_SITE_OTHER): Payer: Medicare Other | Admitting: General Practice

## 2019-03-08 ENCOUNTER — Other Ambulatory Visit: Payer: Self-pay

## 2019-03-08 DIAGNOSIS — Z7901 Long term (current) use of anticoagulants: Secondary | ICD-10-CM

## 2019-03-08 LAB — POCT INR: INR: 2.2 (ref 2.0–3.0)

## 2019-03-08 NOTE — Patient Instructions (Addendum)
Pre visit review using our clinic review tool, if applicable. No additional management support is needed unless otherwise documented below in the visit note.  Continue to take 1 tablet daily except 1 1/2 tablets on Mondays.   Re-check in 4 weeks.  

## 2019-03-08 NOTE — Progress Notes (Signed)
Medical screening examination/treatment/procedure(s) were performed by non-physician practitioner and as supervising physician I was immediately available for consultation/collaboration. I agree with above. James John, MD   

## 2019-03-13 ENCOUNTER — Encounter: Payer: Self-pay | Admitting: Orthopaedic Surgery

## 2019-03-13 ENCOUNTER — Ambulatory Visit (INDEPENDENT_AMBULATORY_CARE_PROVIDER_SITE_OTHER): Payer: Medicare Other | Admitting: Orthopaedic Surgery

## 2019-03-13 ENCOUNTER — Other Ambulatory Visit: Payer: Self-pay

## 2019-03-13 DIAGNOSIS — M1711 Unilateral primary osteoarthritis, right knee: Secondary | ICD-10-CM

## 2019-03-13 MED ORDER — LIDOCAINE HCL 1 % IJ SOLN
2.0000 mL | INTRAMUSCULAR | Status: AC | PRN
  Administered 2019-03-13: 2 mL

## 2019-03-13 MED ORDER — HYLAN G-F 20 48 MG/6ML IX SOSY
48.0000 mg | PREFILLED_SYRINGE | INTRA_ARTICULAR | Status: AC | PRN
  Administered 2019-03-13: 48 mg via INTRA_ARTICULAR

## 2019-03-13 MED ORDER — BUPIVACAINE HCL 0.25 % IJ SOLN
2.0000 mL | INTRAMUSCULAR | Status: AC | PRN
  Administered 2019-03-13: 2 mL via INTRA_ARTICULAR

## 2019-03-13 NOTE — Progress Notes (Signed)
   Procedure Note  Patient: [name] [name]             Date of Birth: 12-03-1924           MRN: [id number]             Visit Date: 03/13/2019  Procedures: Visit Diagnoses:  1. Unilateral primary osteoarthritis, right knee     Large Joint Inj: R knee on 03/13/2019 2:09 PM Indications: pain Details: 22 G needle, anterolateral approach Medications: 2 mL bupivacaine 0.25 %; 48 mg Hylan 48 MG/6ML; 2 mL lidocaine 1 %

## 2019-03-25 ENCOUNTER — Telehealth: Payer: Self-pay | Admitting: Neurology

## 2019-03-25 NOTE — Telephone Encounter (Signed)
Called patient no answer See below Should she continue medication?

## 2019-03-25 NOTE — Telephone Encounter (Signed)
Patient called requesting a call back from a nurse. She said that since starting her medication, Lamotrigine 25 MG, she is not having anymore pain in her jaw. She is now taking one pill in the morning and one in the evening. She did not increase the medication once her pain went away. She'd like to know if she should continue taking it at all.

## 2019-03-25 NOTE — Telephone Encounter (Signed)
Called spoke with patient she was informed of provider response and will continue with medication

## 2019-03-25 NOTE — Telephone Encounter (Signed)
She may remain on lamotrigine 25mg  twice daily.  I wouldn't have her discontinue it yet.

## 2019-04-03 ENCOUNTER — Other Ambulatory Visit: Payer: Self-pay | Admitting: Allergy and Immunology

## 2019-04-05 ENCOUNTER — Ambulatory Visit: Payer: Medicare Other

## 2019-04-09 ENCOUNTER — Ambulatory Visit: Payer: Medicare Other

## 2019-04-12 ENCOUNTER — Ambulatory Visit (INDEPENDENT_AMBULATORY_CARE_PROVIDER_SITE_OTHER): Payer: Medicare Other | Admitting: General Practice

## 2019-04-12 ENCOUNTER — Other Ambulatory Visit: Payer: Self-pay

## 2019-04-12 DIAGNOSIS — Z7901 Long term (current) use of anticoagulants: Secondary | ICD-10-CM | POA: Diagnosis not present

## 2019-04-12 LAB — POCT INR: INR: 2.8 (ref 2.0–3.0)

## 2019-04-12 NOTE — Progress Notes (Signed)
Medical screening examination/treatment/procedure(s) were performed by non-physician practitioner and as supervising physician I was immediately available for consultation/collaboration. I agree with above. Maripaz Mullan, MD   

## 2019-04-12 NOTE — Patient Instructions (Addendum)
Pre visit review using our clinic review tool, if applicable. No additional management support is needed unless otherwise documented below in the visit note.  Continue to take 1 tablet daily except 1 1/2 tablets on Mondays.   Re-check in 6 weeks.

## 2019-04-13 ENCOUNTER — Other Ambulatory Visit: Payer: Self-pay | Admitting: Neurology

## 2019-04-16 ENCOUNTER — Other Ambulatory Visit: Payer: Self-pay

## 2019-04-16 ENCOUNTER — Encounter: Payer: Medicare Other | Attending: Physical Medicine & Rehabilitation | Admitting: Physical Medicine & Rehabilitation

## 2019-04-16 ENCOUNTER — Encounter: Payer: Self-pay | Admitting: Physical Medicine & Rehabilitation

## 2019-04-16 VITALS — BP 159/69 | Ht 59.0 in | Wt 160.0 lb

## 2019-04-16 DIAGNOSIS — R269 Unspecified abnormalities of gait and mobility: Secondary | ICD-10-CM | POA: Diagnosis not present

## 2019-04-16 DIAGNOSIS — M25561 Pain in right knee: Secondary | ICD-10-CM

## 2019-04-16 DIAGNOSIS — M792 Neuralgia and neuritis, unspecified: Secondary | ICD-10-CM

## 2019-04-16 DIAGNOSIS — M961 Postlaminectomy syndrome, not elsewhere classified: Secondary | ICD-10-CM

## 2019-04-16 DIAGNOSIS — Z7982 Long term (current) use of aspirin: Secondary | ICD-10-CM

## 2019-04-16 DIAGNOSIS — G894 Chronic pain syndrome: Secondary | ICD-10-CM

## 2019-04-16 DIAGNOSIS — M545 Low back pain: Secondary | ICD-10-CM | POA: Diagnosis not present

## 2019-04-16 DIAGNOSIS — Z87891 Personal history of nicotine dependence: Secondary | ICD-10-CM

## 2019-04-16 DIAGNOSIS — I10 Essential (primary) hypertension: Secondary | ICD-10-CM

## 2019-04-16 DIAGNOSIS — Z86718 Personal history of other venous thrombosis and embolism: Secondary | ICD-10-CM

## 2019-04-16 DIAGNOSIS — B0222 Postherpetic trigeminal neuralgia: Secondary | ICD-10-CM

## 2019-04-16 DIAGNOSIS — J449 Chronic obstructive pulmonary disease, unspecified: Secondary | ICD-10-CM

## 2019-04-16 DIAGNOSIS — M25571 Pain in right ankle and joints of right foot: Secondary | ICD-10-CM

## 2019-04-16 DIAGNOSIS — M791 Myalgia, unspecified site: Secondary | ICD-10-CM

## 2019-04-16 DIAGNOSIS — M109 Gout, unspecified: Secondary | ICD-10-CM

## 2019-04-16 DIAGNOSIS — M1711 Unilateral primary osteoarthritis, right knee: Secondary | ICD-10-CM

## 2019-04-16 DIAGNOSIS — I739 Peripheral vascular disease, unspecified: Secondary | ICD-10-CM

## 2019-04-16 DIAGNOSIS — G479 Sleep disorder, unspecified: Secondary | ICD-10-CM

## 2019-04-16 DIAGNOSIS — Z981 Arthrodesis status: Secondary | ICD-10-CM

## 2019-04-16 NOTE — Progress Notes (Signed)
Subjective:    Patient ID: [name] [name], female    DOB: [birth year], 85 y.o.   MRN: [id number]  TELEHEALTH NOTE  Due to national recommendations of social distancing due to COVID 19, an audio/video telehealth visit is felt to be most appropriate for this patient at this time.  See Chart message from today for the patient's consent to telehealth from [place].     I verified that I am speaking with the correct person using two identifiers.  Location of patient: Home Location of provider: Office Method of communication: Webex converted to telephone due to technical issues Names of participants : Zorita Pang scheduling, Marland Mcalpine obtaining consent and vitals if available Established patient Time spent on call: 19 minutes  HPI  Female with pmh/psh of PAD, COPD, DVT, HTN, Gout, trigeminal neuralgia, post herpetic neuralgia, OA of right knee with injections, lumbar fusion present for follow up of pain in her back > right leg pain.   Initially stated: Pt stated she has had back pain "all her life".  Getting progressively worse.  Movement improves the pain.  Standing and walking exacerbate the pain. Dull pain.  Non-radiating.  Intermittent.  Denies associated numbness, tingling, weakness.  Lidoderm patch help. Pain limits pt from doing things around the house.   Last clinic visit 10/11/2018. Since that time, she notes back pain with trigeminal neuralgia. She notes she has been more sedentary recently. She continues to use TENS, Lidoderm patched, Cymbalta. Baclofen causes sedation. Sleep is poor, but naps in the afternoon.  Denies falls. She had a right knee injection with some benefit.   Pain Inventory Average Pain 8 Pain Right Now 9 My pain is sharp and aching  In the last 24 hours, has pain interfered with the following? General activity 5 Relation with others 0 Enjoyment of life 0 What TIME of day is your pain at its worst? daytime and night /  when up on feet,  Sleep (in general) Good  Pain is worse with: walking, bending, standing, some activites and weight bearing Pain improves with: rest, pacing activities and laying down  Relief from Meds: 0  Mobility walk without assistance use a cane how many minutes can you walk? 2-3 ability to climb steps?  yes do you drive?  yes Do you have any goals in this area?  yes  Function retired  Neuro/Psych bladder control problems trouble walking  Prior Studies Any changes since last visit?  no  Physicians involved in your care Any changes since last visit?  no   Family History  Problem Relation Age of Onset  . Arthritis Mother   . Hypertension Mother   . Heart disease Father   . Colon polyps Father   . Breast cancer Sister   . Breast cancer Daughter   . Hypertension Son   . Lung cancer Brother   . Colon cancer Sister   . Brain cancer Sister   . Lung cancer Sister   . Anesthesia problems Neg Hx   . Hypotension Neg Hx   . Malignant hyperthermia Neg Hx   . Pseudochol deficiency Neg Hx    Social History   Socioeconomic History  . Marital status: Single    Spouse name: Not on file  . Number of children: 4  . Years of education: 59  . Highest education level: Not on file  Occupational History  . Occupation: retired    Fish farm manager: RETIRED  Tobacco Use  . Smoking status:  Former Smoker    Packs/day: 2.00    Years: 30.00    Pack years: 60.00    Types: Cigarettes    Quit date: 06/18/1993    Years since quitting: 25.8  . Smokeless tobacco: Never Used  . Tobacco comment: QUIT IN 1995  Substance and Sexual Activity  . Alcohol use: No    Alcohol/week: 0.0 standard drinks  . Drug use: No  . Sexual activity: Not on file  Other Topics Concern  . Not on file  Social History Narrative   Patient is widowed with 4 children. 2 grandkids. Lives alone.    Patient is right handed.   Patient has high school education.   Patient drinks 1 cup daily.      Retired from  [employer] for 28 years, works 2 days a week until 2016.       Hobbies: volunteers at [employer], [employer] from church visits people.       No caffeine.    Social Determinants of Health   Financial Resource Strain:   . Difficulty of Paying Living Expenses: Not on file  Food Insecurity:   . Worried About Charity fundraiser in the Last Year: Not on file  . Ran Out of Food in the Last Year: Not on file  Transportation Needs:   . Lack of Transportation (Medical): Not on file  . Lack of Transportation (Non-Medical): Not on file  Physical Activity:   . Days of Exercise per Week: Not on file  . Minutes of Exercise per Session: Not on file  Stress:   . Feeling of Stress : Not on file  Social Connections:   . Frequency of Communication with Friends and Family: Not on file  . Frequency of Social Gatherings with Friends and Family: Not on file  . Attends Religious Services: Not on file  . Active Member of Clubs or Organizations: Not on file  . Attends Archivist Meetings: Not on file  . Marital Status: Not on file   Past Surgical History:  Procedure Laterality Date  . ABDOMINAL AORTAGRAM N/A 06/17/2011   Procedure: ABDOMINAL Maxcine Ham;  Surgeon: Elam Dutch, MD;  Location: [facility] CATH LAB;  Service: Cardiovascular;  Laterality: N/A;  . ABDOMINAL AORTOGRAM W/LOWER EXTREMITY Bilateral 06/22/2018   Procedure: ABDOMINAL AORTOGRAM W/LOWER EXTREMITY;  Surgeon: Elam Dutch, MD;  Location: [facility] CV LAB;  Service: Cardiovascular;  Laterality: Bilateral;  . CATARACT EXTRACTION, BILATERAL     w IOL  . CHOLECYSTECTOMY  1998  . FEMORAL-POPLITEAL BYPASS GRAFT        x2 surgeries 1990's & 2009  . INJECTION KNEE Right Aug. 2016   Gel injection for pain  . LUMBAR FUSION  07/06/2011  . TONSILLECTOMY     as a teenager   . TUBAL LIGATION     Past Medical History:  Diagnosis Date  . Allergic rhinoconjunctivitis   . Allergy   . Anxiety    pt. managed- uses deep  breathing   . Arthritis    low back , stenosis  . COLONIC POLYPS, RECURRENT 08/29/2006   2008 last colonoscopy. No further colonoscopy.     Marland Kitchen COPD (chronic obstructive pulmonary disease) ([place])   . Diverticulosis   . Eczema   . Environmental allergies    allergy shot- q friday in Dr. Janee Morn office. PFT's abnormal- recommended Spiriva to use preop & will d/c after surgery  . GERD (gastroesophageal reflux disease)   . Hiatal hernia   .  Hyperlipidemia   . Hypertension   . Lung nodule 2011  . Peripheral vascular disease ([place])   . Shingles   . Stenosis of popliteal artery (HCC)    blood clots in legs long ago    . Trigeminal neuralgia    Ht 4\' 11"  (1.499 m)   Wt 160 lb (72.6 kg)   BMI 32.32 kg/m   Opioid Risk Score:   Fall Risk Score:  `1  Depression screen PHQ 2/9  Depression screen [facility] 2/9 12/05/2018 08/08/2018 05/17/2018 01/05/2018 11/01/2017 02/22/2017 10/31/2016  Decreased Interest 0 0 0 0 0 0 0  Down, Depressed, Hopeless 0 0 0 0 0 0 0  PHQ - 2 Score 0 0 0 0 0 0 0  Altered sleeping - - - - 0 - -  Tired, decreased energy - - - - 0 - -  Change in appetite - - - - 0 - -  Feeling bad or failure about yourself  - - - - 0 - -  Trouble concentrating - - - - 0 - -  Moving slowly or fidgety/restless - - - - 0 - -  Suicidal thoughts - - - - 0 - -  PHQ-9 Score - - - - 0 - -  Difficult doing work/chores - - - - Not difficult at all - -  Some recent data might be hidden   Review of Systems  HENT: Negative.   Eyes: Negative.   Respiratory: Negative.   Cardiovascular: Negative.   Gastrointestinal: Negative.        Left flank pain  Endocrine: Negative.        High blood pressure   Genitourinary: Negative.   Musculoskeletal: Positive for arthralgias, back pain, gait problem and myalgias.  Skin: Negative.   Allergic/Immunologic: Negative.   Hematological: Negative.   Psychiatric/Behavioral: Negative.       Objective:   Physical Exam Gen: NAD.  Pulm: Effort normal Neuro: Alert     Assessment & Plan:  Female with pmhpsh of PAD, COPD, DVT, HTN, Gout, trigeminal neuralgia, post herpetic neuralgia, OA of right knee with injections, lumbar fusion present for follow up of pain in her back > right leg pain.    1. Chronic low back pain with failed back syndrome   Xray of L-spine and right SI joint reviewed, SI joint unremarkable, degenerative changes in spine with right scoliosis.    Will avoid NSAIDs due to coumadin  Stopped using Robaxin due to nausea  Unable to tolerate Gabapentin  Cont heat  Cont tylenol, limit to 2000mg /day  Cont Lidoderm patches  Cont HEP, completed PT  Cont TENS unit PRN  Cont Cymbalta to 60 mg  Cont Baclofen 10 BID, may add 5mg  dose in the afternoon  2. Possible Sacroiliitis on right  Xray unremakrable  Minimal benefit with bracing   Improving  3. Sleep disturbance  Significantly improved  Cont melatonin  4. Neuropathic pain  See #1  5. Gait abnormality  Improved  Cont cane   Cont HEP  Information given on core strengthening exercises, will mail again  6. Myalgia  Very good benefit with trigger point injections, will schedule again  7. Right knee pain  MRI reviewed from 2016, showing meniscal/cartilage damage  Receiving injections from Ortho, cont  Pensaid denied  Cont voltaren gel  Cont lidoderm on knee  Improved  8. Right ankle pain - likely gout flare  Lidoderm patches, pt using Voltaren gel  Xrays reviewed, relatively uremarkable  Uric acid elevated  Improved

## 2019-04-30 ENCOUNTER — Encounter: Payer: Self-pay | Admitting: Physical Medicine & Rehabilitation

## 2019-04-30 ENCOUNTER — Other Ambulatory Visit: Payer: Self-pay

## 2019-04-30 ENCOUNTER — Encounter: Payer: Medicare Other | Attending: Physical Medicine & Rehabilitation | Admitting: Physical Medicine & Rehabilitation

## 2019-04-30 VITALS — BP 121/75 | HR 102 | Temp 97.7°F | Ht 59.0 in | Wt 160.0 lb

## 2019-04-30 DIAGNOSIS — M791 Myalgia, unspecified site: Secondary | ICD-10-CM | POA: Diagnosis not present

## 2019-04-30 DIAGNOSIS — M549 Dorsalgia, unspecified: Secondary | ICD-10-CM | POA: Diagnosis not present

## 2019-04-30 NOTE — Progress Notes (Signed)
Trigger point injection procedure note: Trigger Point Injection: Written consent was obtained for the patient. Trigger points were identified of bilateral thoracolumbosacral PSP muscles. The areas were cleaned with alcohol, vapocoolant spray applied, needle drawback performed, and each of  these trigger points were injected with 1 cc of 0.5% Marcaine (x5). There were no complications from the procedure, and it was well tolerated.

## 2019-05-06 ENCOUNTER — Other Ambulatory Visit: Payer: Self-pay | Admitting: Allergy and Immunology

## 2019-05-08 ENCOUNTER — Other Ambulatory Visit (INDEPENDENT_AMBULATORY_CARE_PROVIDER_SITE_OTHER): Payer: Medicare Other

## 2019-05-08 ENCOUNTER — Other Ambulatory Visit: Payer: Self-pay

## 2019-05-08 DIAGNOSIS — Z79899 Other long term (current) drug therapy: Secondary | ICD-10-CM

## 2019-05-08 DIAGNOSIS — G500 Trigeminal neuralgia: Secondary | ICD-10-CM | POA: Diagnosis not present

## 2019-05-08 LAB — BASIC METABOLIC PANEL
BUN/Creatinine Ratio: 18 (calc) (ref 6–22)
BUN: 21 mg/dL (ref 7–25)
CO2: 28 mmol/L (ref 20–32)
Calcium: 9.7 mg/dL (ref 8.6–10.4)
Chloride: 104 mmol/L (ref 98–110)
Creat: 1.17 mg/dL — ABNORMAL HIGH (ref 0.60–0.88)
Glucose, Bld: 99 mg/dL (ref 65–99)
Potassium: 4.1 mmol/L (ref 3.5–5.3)
Sodium: 142 mmol/L (ref 135–146)

## 2019-05-12 NOTE — Progress Notes (Signed)
NEUROLOGY FOLLOW UP OFFICE NOTE  LIESEL DANZA [id number]  HISTORY OF PRESENT ILLNESS: [name] [name] is a 86 year old female with COPD and CKD and history of DVTs on chronic Coumadin who follows up for left-sided trigeminal neuralgia.    UPDATE: Current medication:  Lamotrigine 25mg  twice daily  She was started on oxcarbazepine but it made her drowsy, so she was switched to lamotrigine. After the first week, she was pain-free, so she stayed at dose of 25mg  twice daily.  About 2 weeks later, she started having brief attacks again.  They are now manageable.  They typically occur once a day.  Generally, they occur in the morning when she first gets up or at beginning of brushing her teeth or eating.  They just last a minute or two.  She tried increasing the lamotrigine to 50mg  twice daily for 2-3 days but it made her feel more tired..    05/08/2019 LAB:  BMP with Na 142, K 4.1, Cl 104, CO2 28, glucose 99, BUN 21, Cr 1.17  HISTORY: She was diagnosed with right-sided trigeminal neuralgia in 2016.  She was treated at that time. She was treated with carbamazepine which was stopped due to interference with Coumadin and later gabapentin.  It subsequently resolved.  5 weeks ago, she began having left sided facial.  This is worse than the previous episode.  It mostly involves the cheek.  It is sometimes a paroxysmal stabbing pain or a constant sharp electric pain.  It is daily.  It gets worse later in the day.  Movement aggravates it.  She cannot sleep, talk on the phone or eat.  Staying still helps ease it.  No numbness or tingling.  No facial weakness.  No associated rash.  No change in hearing or vision.    Past medications:  Gabapentin (unable to tolerate), carbamazepine (interfered with Coumadin), oxcarbazepine (drowsiness); Cymbalta (not used for this), tramadol (caused nausea); baclofen (for back pain)  PAST MEDICAL HISTORY: Past Medical History:  Diagnosis Date  . Allergic  rhinoconjunctivitis   . Allergy   . Anxiety    pt. managed- uses deep breathing   . Arthritis    low back , stenosis  . COLONIC POLYPS, RECURRENT 08/29/2006   2008 last colonoscopy. No further colonoscopy.     Marland Kitchen COPD (chronic obstructive pulmonary disease) ([place])   . Diverticulosis   . Eczema   . Environmental allergies    allergy shot- q friday in Dr. Janee Morn office. PFT's abnormal- recommended Spiriva to use preop & will d/c after surgery  . GERD (gastroesophageal reflux disease)   . Hiatal hernia   . Hyperlipidemia   . Hypertension   . Lung nodule 2011  . Peripheral vascular disease ([place])   . Shingles   . Stenosis of popliteal artery (HCC)    blood clots in legs long ago    . Trigeminal neuralgia     MEDICATIONS: Current Outpatient Medications on File Prior to Visit  Medication Sig Dispense Refill  . acetaminophen (TYLENOL) 500 MG tablet Take 1,000 mg by mouth 2 (two) times daily. Reported on 08/31/2015    . aspirin EC 81 MG tablet Take 81 mg by mouth every evening.     Marland Kitchen atorvastatin (LIPITOR) 40 MG tablet Take 1 tablet (40 mg total) by mouth daily. 90 tablet 3  . baclofen (LIORESAL) 10 MG tablet Take 1 tablet by mouth twice daily 180 tablet 0  . colchicine (COLCRYS) 0.6 MG tablet Take 2 pills at  first sign gout flare, then another pill 2 hours later if still having pain. Then take once daily until pain resolves. (Patient taking differently: Take 0.6-1.2 mg by mouth See admin instructions. Take 1.2 mg at first sign gout flare, then 0.6 mg 2 hours later if still having pain. Then take 0.6 mg once daily until pain resolves.) 30 tablet 0  . hydrochlorothiazide (HYDRODIURIL) 25 MG tablet Take 1 tablet (25 mg total) by mouth at bedtime. 90 tablet 3  . ipratropium (ATROVENT) 0.06 % nasal spray INHALE 2 SPRAYS INTO BOTH NOSTRILS THREE TIMES A DAY 15 mL 8  . irbesartan (AVAPRO) 300 MG tablet Take 1 tablet (300 mg total) by mouth daily. 90 tablet 3  . lamoTRIgine (LAMICTAL) 25 MG tablet  TAKE 1 TABLET BY MOUTH AT BEDTIME FOR 14 DAYS THEN 1 TWICE DAILY FOR 14 DAYS THEN 2 TWICE DAILY THEREAFTER 120 tablet 0  . levocetirizine (XYZAL) 5 MG tablet Take 5 mg by mouth every evening.    . Multiple Vitamin (MULTIVITAMIN WITH MINERALS) TABS tablet Take 1 tablet by mouth daily.    . OXcarbazepine (TRILEPTAL) 150 MG tablet Take 1 tablet at bedtime for 7 days, then increase to 1 tablet twice daily. 60 tablet 0  . pantoprazole (PROTONIX) 40 MG tablet Take 1 tablet by mouth twice daily 60 tablet 5  . RESTASIS 0.05 % ophthalmic emulsion Place 1 drop into both eyes 2 (two) times daily.   99  . warfarin (COUMADIN) 5 MG tablet TAKE 1 TABLETS BY MOUTH ONCE DAILY, EXCEPT TAKE 1 & 1/2 TABLETS ON Monday OR TAKE AS DIRECTED BY ANTICOAGULATION CLINIC 120 tablet 1   No current facility-administered medications on file prior to visit.    ALLERGIES: Allergies  Allergen Reactions  . Latex Itching and Rash  . Sulfa Antibiotics Other (See Comments)    Cold sweat light headed and disorientation  . Tiotropium Bromide Shortness Of Breath and Other (See Comments)    Sore throat also  . Ultram [Tramadol] Nausea And Vomiting    FAMILY HISTORY: Family History  Problem Relation Age of Onset  . Arthritis Mother   . Hypertension Mother   . Heart disease Father   . Colon polyps Father   . Breast cancer Sister   . Breast cancer Daughter   . Hypertension Son   . Lung cancer Brother   . Colon cancer Sister   . Brain cancer Sister   . Lung cancer Sister   . Anesthesia problems Neg Hx   . Hypotension Neg Hx   . Malignant hyperthermia Neg Hx   . Pseudochol deficiency Neg Hx    SOCIAL HISTORY: Social History   Socioeconomic History  . Marital status: Single    Spouse name: Not on file  . Number of children: 4  . Years of education: 57  . Highest education level: Not on file  Occupational History  . Occupation: retired    Fish farm manager: RETIRED  Tobacco Use  . Smoking status: Former Smoker     Packs/day: 2.00    Years: 30.00    Pack years: 60.00    Types: Cigarettes    Quit date: 06/18/1993    Years since quitting: 25.9  . Smokeless tobacco: Never Used  . Tobacco comment: QUIT IN 1995  Substance and Sexual Activity  . Alcohol use: No    Alcohol/week: 0.0 standard drinks  . Drug use: No  . Sexual activity: Not on file  Other Topics Concern  . Not on file  Social History Narrative   Patient is widowed with 4 children. 2 grandkids. Lives alone.    Patient is right handed.   Patient has high school education.   Patient drinks 1 cup daily.      Retired from [employer] for 28 years, works 2 days a week until 2016.       Hobbies: volunteers at [employer], [employer] from church visits people.       No caffeine.    Social Determinants of Health   Financial Resource Strain:   . Difficulty of Paying Living Expenses: Not on file  Food Insecurity:   . Worried About Charity fundraiser in the Last Year: Not on file  . Ran Out of Food in the Last Year: Not on file  Transportation Needs:   . Lack of Transportation (Medical): Not on file  . Lack of Transportation (Non-Medical): Not on file  Physical Activity:   . Days of Exercise per Week: Not on file  . Minutes of Exercise per Session: Not on file  Stress:   . Feeling of Stress : Not on file  Social Connections:   . Frequency of Communication with Friends and Family: Not on file  . Frequency of Social Gatherings with Friends and Family: Not on file  . Attends Religious Services: Not on file  . Active Member of Clubs or Organizations: Not on file  . Attends Archivist Meetings: Not on file  . Marital Status: Not on file  Intimate Partner Violence:   . Fear of Current or Ex-Partner: Not on file  . Emotionally Abused: Not on file  . Physically Abused: Not on file  . Sexually Abused: Not on file    REVIEW OF SYSTEMS: Constitutional: No fevers, chills, or sweats, no generalized fatigue, change in  appetite Eyes: No visual changes, double vision, eye pain Ear, nose and throat: No hearing loss, ear pain, nasal congestion, sore throat Cardiovascular: No chest pain, palpitations Respiratory:  No shortness of breath at rest or with exertion, wheezes GastrointestinaI: No nausea, vomiting, diarrhea, abdominal pain, fecal incontinence Genitourinary:  No dysuria, urinary retention or frequency Musculoskeletal:  No neck pain, back pain Integumentary: No rash, pruritus, skin lesions Neurological: as above Psychiatric: No depression, insomnia, anxiety Endocrine: No palpitations, fatigue, diaphoresis, mood swings, change in appetite, change in weight, increased thirst Hematologic/Lymphatic:  No purpura, petechiae. Allergic/Immunologic: no itchy/runny eyes, nasal congestion, recent allergic reactions, rashes  PHYSICAL EXAM: Blood pressure (!) 146/94, pulse (!) 113, temperature (!) 97.5 F (36.4 C), height 4\' 11"  (1.499 m), weight 169 lb 9.6 oz (76.9 kg), SpO2 97 %. General: No acute distress.  Patient appears well-groomed.   Head:  Normocephalic/atraumatic Eyes:  Fundi examined but not visualized Neck: supple, no paraspinal tenderness, full range of motion Heart:  Regular rate and rhythm Lungs:  Clear to auscultation bilaterally Back: No paraspinal tenderness Neurological Exam: alert and oriented to person, place, and time. Attention span and concentration intact, recent and remote memory intact, fund of knowledge intact.  Speech fluent and not dysarthric, language intact.  CN II-XII intact. Bulk and tone normal, muscle strength 5/5 throughout.  Sensation to light touch, temperature and vibration intact.  Deep tendon reflexes 2+ throughout, toes downgoing.  Finger to nose and heel to shin testing intact.  Gait normal, Romberg negative.  IMPRESSION: Right-sided Trigeminal neuralgia  PLAN: 1.  Will increase lamotrigine to 25mg  in morning and 50mg  at night.   2.  Follow up in 5 months.  Metta Clines, DO  CC: [name], MD

## 2019-05-14 ENCOUNTER — Ambulatory Visit: Payer: Medicare Other | Attending: Internal Medicine

## 2019-05-14 ENCOUNTER — Other Ambulatory Visit: Payer: Self-pay

## 2019-05-14 ENCOUNTER — Encounter: Payer: Self-pay | Admitting: Neurology

## 2019-05-14 ENCOUNTER — Ambulatory Visit (INDEPENDENT_AMBULATORY_CARE_PROVIDER_SITE_OTHER): Payer: Medicare Other | Admitting: Neurology

## 2019-05-14 VITALS — BP 146/94 | HR 113 | Temp 97.5°F | Ht 59.0 in | Wt 169.6 lb

## 2019-05-14 DIAGNOSIS — Z23 Encounter for immunization: Secondary | ICD-10-CM | POA: Insufficient documentation

## 2019-05-14 DIAGNOSIS — G500 Trigeminal neuralgia: Secondary | ICD-10-CM

## 2019-05-14 MED ORDER — LAMOTRIGINE 25 MG PO TABS: ORAL_TABLET | ORAL | 4 refills | Status: DC

## 2019-05-14 NOTE — Progress Notes (Signed)
   Covid-19 Vaccination Clinic  Name:  HABY ROURKE    MRN: [id number] DOB: [birth year]  05/14/2019  Ms. Buonocore was observed post Covid-19 immunization for 30 minutes based on pre-vaccination screening without incidence. She was provided with Vaccine Information Sheet and instruction to access the V-Safe system.   Ms. Piasecki was instructed to call 911 with any severe reactions post vaccine: Marland Kitchen Difficulty breathing  . Swelling of your face and throat  . A fast heartbeat  . A bad rash all over your body  . Dizziness and weakness    Immunizations Administered    Name Date Dose VIS Date Route   Pfizer COVID-19 Vaccine 05/14/2019  3:31 PM 0.3 mL 04/05/2019 Intramuscular   Manufacturer: [employer], [employer]   Lot: [id number]   [place]: [id number]

## 2019-05-14 NOTE — Patient Instructions (Signed)
1.  We will increase the lamotrigine 25mg  a little more:  1 tablet in morning and 2 tablets at night.  There is still room to increase dose if needed. 2.  Follow up in 5 months.

## 2019-05-16 ENCOUNTER — Other Ambulatory Visit: Payer: Self-pay

## 2019-05-16 ENCOUNTER — Encounter: Payer: Self-pay | Admitting: Physical Medicine & Rehabilitation

## 2019-05-16 ENCOUNTER — Encounter (HOSPITAL_BASED_OUTPATIENT_CLINIC_OR_DEPARTMENT_OTHER): Payer: Medicare Other | Admitting: Physical Medicine & Rehabilitation

## 2019-05-16 VITALS — BP 125/72 | HR 77 | Temp 98.0°F | Ht 59.0 in | Wt 167.4 lb

## 2019-05-16 DIAGNOSIS — M791 Myalgia, unspecified site: Secondary | ICD-10-CM

## 2019-05-16 DIAGNOSIS — M549 Dorsalgia, unspecified: Secondary | ICD-10-CM | POA: Diagnosis not present

## 2019-05-16 NOTE — Progress Notes (Signed)
Trigger point injection procedure note: Trigger Point Injection: Written consent was obtained for the patient. Trigger points were identified of bilateral thoracolumbosacral PSP muscles. The areas were cleaned with alcohol, vapocoolant spray applied, needle drawback performed, and each of  these trigger points were injected with 1 cc of 0.5% Marcaine (x6). There were no complications from the procedure, and it was well tolerated.

## 2019-05-24 ENCOUNTER — Other Ambulatory Visit: Payer: Self-pay

## 2019-05-24 ENCOUNTER — Ambulatory Visit (INDEPENDENT_AMBULATORY_CARE_PROVIDER_SITE_OTHER): Payer: Medicare Other | Admitting: General Practice

## 2019-05-24 DIAGNOSIS — Z7901 Long term (current) use of anticoagulants: Secondary | ICD-10-CM

## 2019-05-24 LAB — POCT INR: INR: 2.8 (ref 2.0–3.0)

## 2019-05-24 NOTE — Patient Instructions (Addendum)
Pre visit review using our clinic review tool, if applicable. No additional management support is needed unless otherwise documented below in the visit note.  Continue to take 1 tablet daily except 1 1/2 tablets on Mondays.   Re-check in 6 weeks.

## 2019-05-24 NOTE — Progress Notes (Signed)
Medical screening examination/treatment/procedure(s) were performed by non-physician practitioner and as supervising physician I was immediately available for consultation/collaboration. I agree with above. Jaslynn Thome, MD   

## 2019-05-30 ENCOUNTER — Encounter: Payer: Medicare Other | Attending: Physical Medicine & Rehabilitation | Admitting: Physical Medicine & Rehabilitation

## 2019-05-30 ENCOUNTER — Other Ambulatory Visit: Payer: Self-pay

## 2019-05-30 ENCOUNTER — Encounter: Payer: Self-pay | Admitting: Physical Medicine & Rehabilitation

## 2019-05-30 VITALS — Ht 59.0 in | Wt 167.0 lb

## 2019-05-30 DIAGNOSIS — M549 Dorsalgia, unspecified: Secondary | ICD-10-CM | POA: Insufficient documentation

## 2019-05-30 DIAGNOSIS — R269 Unspecified abnormalities of gait and mobility: Secondary | ICD-10-CM

## 2019-05-30 DIAGNOSIS — G894 Chronic pain syndrome: Secondary | ICD-10-CM

## 2019-05-30 DIAGNOSIS — M961 Postlaminectomy syndrome, not elsewhere classified: Secondary | ICD-10-CM | POA: Diagnosis not present

## 2019-05-30 DIAGNOSIS — M5441 Lumbago with sciatica, right side: Secondary | ICD-10-CM

## 2019-05-30 DIAGNOSIS — G8929 Other chronic pain: Secondary | ICD-10-CM | POA: Diagnosis not present

## 2019-05-30 DIAGNOSIS — M25561 Pain in right knee: Secondary | ICD-10-CM | POA: Diagnosis not present

## 2019-05-30 DIAGNOSIS — M533 Sacrococcygeal disorders, not elsewhere classified: Secondary | ICD-10-CM | POA: Diagnosis not present

## 2019-05-30 DIAGNOSIS — M791 Myalgia, unspecified site: Secondary | ICD-10-CM | POA: Diagnosis not present

## 2019-05-30 DIAGNOSIS — M792 Neuralgia and neuritis, unspecified: Secondary | ICD-10-CM

## 2019-05-30 NOTE — Progress Notes (Signed)
Subjective:    Patient ID: [name] [name], female    DOB: 12/08/1924, 86 y.o.   MRN: [id number]  TELEHEALTH NOTE  Due to national recommendations of social distancing due to COVID 19, an audio/video telehealth visit is felt to be most appropriate for this patient at this time.  See Chart message from today for the patient's consent to telehealth from [place].     I verified that I am speaking with the correct person using two identifiers.  Location of patient: Home Location of provider: Office Method of communication: WebEx transition to telephone due to technical difficulties Names of participants : Zorita Pang scheduling, Jasmine December obtaining consent and vitals if available Established patient Time spent on call: 13 minutes  HPI  Female with pmh/psh of PAD, COPD, DVT, HTN, Gout, trigeminal neuralgia, post herpetic neuralgia, OA of right knee with injections, lumbar fusion present for follow up of pain in her back > right leg pain.   Initially stated: Pt stated she has had back pain "all her life".  Getting progressively worse.  Movement improves the pain.  Standing and walking exacerbate the pain. Dull pain.  Non-radiating.  Intermittent.  Denies associated numbness, tingling, weakness.  Lidoderm patch help. Pain limits pt from doing things around the house.   Last clinic visit 05/16/2019.  She is ready for injections at that time.  Since that time, patient states the injections have worked "great". She has not had to use Lidoderm patches, TENS. She is taking Cymbalta and Baclofen 5mg .  Denies falls.   Pain Inventory Average Pain 8 Pain Right Now 4 My pain is aching  In the last 24 hours, has pain interfered with the following? General activity 5 Relation with others 0 Enjoyment of life 0 What TIME of day is your pain at its worst? daytime and night / when up on feet,  Sleep (in general) Good  Pain is worse with: walking, bending,  standing, some activites and weight bearing Pain improves with: rest, pacing activities and laying down  Relief from Meds: 10  Mobility walk without assistance use a cane how many minutes can you walk? 2-3 ability to climb steps?  yes do you drive?  yes Do you have any goals in this area?  yes  Function retired  Neuro/Psych bladder control problems trouble walking  Prior Studies Any changes since last visit?  no  Physicians involved in your care Any changes since last visit?  no   Family History  Problem Relation Age of Onset  . Arthritis Mother   . Hypertension Mother   . Heart disease Father   . Colon polyps Father   . Breast cancer Sister   . Breast cancer Daughter   . Hypertension Son   . Lung cancer Brother   . Colon cancer Sister   . Brain cancer Sister   . Lung cancer Sister   . Anesthesia problems Neg Hx   . Hypotension Neg Hx   . Malignant hyperthermia Neg Hx   . Pseudochol deficiency Neg Hx    Social History   Socioeconomic History  . Marital status: Single    Spouse name: Not on file  . Number of children: 4  . Years of education: 11  . Highest education level: Not on file  Occupational History  . Occupation: retired    Fish farm manager: RETIRED  Tobacco Use  . Smoking status: Former Smoker    Packs/day: 2.00    Years: 30.00  Pack years: 60.00    Types: Cigarettes    Quit date: 06/18/1993    Years since quitting: 25.9  . Smokeless tobacco: Never Used  . Tobacco comment: QUIT IN 1995  Substance and Sexual Activity  . Alcohol use: No    Alcohol/week: 0.0 standard drinks  . Drug use: No  . Sexual activity: Not on file  Other Topics Concern  . Not on file  Social History Narrative   Patient is widowed with 4 children. 2 grandkids. Lives alone.    Patient is right handed.   Patient has high school education.   Patient drinks 1 cup daily.      Retired from [employer] for 28 years, works 2 days a week until 2016.       Hobbies:  volunteers at [employer], [place] from church visits people.       No caffeine.    Social Determinants of Health   Financial Resource Strain:   . Difficulty of Paying Living Expenses: Not on file  Food Insecurity:   . Worried About Charity fundraiser in the Last Year: Not on file  . Ran Out of Food in the Last Year: Not on file  Transportation Needs:   . Lack of Transportation (Medical): Not on file  . Lack of Transportation (Non-Medical): Not on file  Physical Activity:   . Days of Exercise per Week: Not on file  . Minutes of Exercise per Session: Not on file  Stress:   . Feeling of Stress : Not on file  Social Connections:   . Frequency of Communication with Friends and Family: Not on file  . Frequency of Social Gatherings with Friends and Family: Not on file  . Attends Religious Services: Not on file  . Active Member of Clubs or Organizations: Not on file  . Attends Archivist Meetings: Not on file  . Marital Status: Not on file   Past Surgical History:  Procedure Laterality Date  . ABDOMINAL AORTAGRAM N/A 06/17/2011   Procedure: ABDOMINAL Maxcine Ham;  Surgeon: Elam Dutch, MD;  Location: [facility] CATH LAB;  Service: Cardiovascular;  Laterality: N/A;  . ABDOMINAL AORTOGRAM W/LOWER EXTREMITY Bilateral 06/22/2018   Procedure: ABDOMINAL AORTOGRAM W/LOWER EXTREMITY;  Surgeon: Elam Dutch, MD;  Location: [facility] CV LAB;  Service: Cardiovascular;  Laterality: Bilateral;  . CATARACT EXTRACTION, BILATERAL     w IOL  . CHOLECYSTECTOMY  1998  . FEMORAL-POPLITEAL BYPASS GRAFT        x2 surgeries 1990's & 2009  . INJECTION KNEE Right Aug. 2016   Gel injection for pain  . LUMBAR FUSION  07/06/2011  . TONSILLECTOMY     as a teenager   . TUBAL LIGATION     Past Medical History:  Diagnosis Date  . Allergic rhinoconjunctivitis   . Allergy   . Anxiety    pt. managed- uses deep breathing   . Arthritis    low back , stenosis  . COLONIC POLYPS, RECURRENT  08/29/2006   2008 last colonoscopy. No further colonoscopy.     Marland Kitchen COPD (chronic obstructive pulmonary disease) ([place])   . Diverticulosis   . Eczema   . Environmental allergies    allergy shot- q friday in Dr. Janee Morn office. PFT's abnormal- recommended Spiriva to use preop & will d/c after surgery  . GERD (gastroesophageal reflux disease)   . Hiatal hernia   . Hyperlipidemia   . Hypertension   . Lung nodule 2011  . Peripheral  vascular disease ([place])   . Shingles   . Stenosis of popliteal artery (HCC)    blood clots in legs long ago    . Trigeminal neuralgia    Ht 4\' 11"  (1.499 m)   Wt 167 lb (75.8 kg)   BMI 33.73 kg/m   Opioid Risk Score:   Fall Risk Score:  `1  Depression screen PHQ 2/9  Depression screen [facility] 2/9 12/05/2018 08/08/2018 05/17/2018 01/05/2018 11/01/2017 02/22/2017 10/31/2016  Decreased Interest 0 0 0 0 0 0 0  Down, Depressed, Hopeless 0 0 0 0 0 0 0  PHQ - 2 Score 0 0 0 0 0 0 0  Altered sleeping - - - - 0 - -  Tired, decreased energy - - - - 0 - -  Change in appetite - - - - 0 - -  Feeling bad or failure about yourself  - - - - 0 - -  Trouble concentrating - - - - 0 - -  Moving slowly or fidgety/restless - - - - 0 - -  Suicidal thoughts - - - - 0 - -  PHQ-9 Score - - - - 0 - -  Difficult doing work/chores - - - - Not difficult at all - -  Some recent data might be hidden   Review of Systems  HENT: Negative.   Eyes: Negative.   Respiratory: Negative.   Cardiovascular: Negative.   Gastrointestinal: Negative.        Left flank pain  Endocrine: Negative.        High blood pressure   Genitourinary: Positive for difficulty urinating.  Musculoskeletal: Positive for arthralgias, back pain, gait problem and myalgias.  Skin: Negative.   Allergic/Immunologic: Negative.   Hematological: Negative.   Psychiatric/Behavioral: Negative.   All other systems reviewed and are negative.     Objective:   Physical Exam Gen: NAD.  Neuro: Alert Psych: Normal mood and  affect.     Assessment & Plan:  Female with pmhpsh of PAD, COPD, DVT, HTN, Gout, trigeminal neuralgia, post herpetic neuralgia, OA of right knee with injections, lumbar fusion present for follow up of pain in her back > right leg pain.    1. Chronic low back pain with failed back syndrome   Xray of L-spine and right SI joint reviewed, SI joint unremarkable, degenerative changes in spine with right scoliosis.    Will avoid NSAIDs due to coumadin  Stopped using Robaxin due to nausea  Unable to tolerate Gabapentin  Cont heat  Cont tylenol, limit to 2000mg /day  Cont Lidoderm patches  Cont HEP, completed PT  Cont TENS unit PRN  Cont Cymbalta to 60 mg  Cont Baclofen 5 BID, may decrease nightly  2. Possible Sacroiliitis on right  Xray unremakrable  Minimal benefit with bracing   Improving  3. Sleep disturbance  Significantly improved  Cont melatonin  4. Neuropathic pain  See #1  5. Gait abnormality  Improved  Cont cane   Cont HEP  Information given on core strengthening exercises, patient more compliant  6. Myalgia  Very good benefit with trigger point injections again on 1/21  7. Right knee pain  MRI reviewed from 2016, showing meniscal/cartilage damage  Receiving injections from Ortho, cont  Pensaid denied  Cont voltaren gel  Cont lidoderm on knee  Improved  8. Right ankle pain - likely gout flare  Lidoderm patches, pt using Voltaren gel  Xrays reviewed, relatively uremarkable  Uric acid elevated  Improved   RTC in 3  months per patient preference

## 2019-06-03 ENCOUNTER — Other Ambulatory Visit: Payer: Self-pay | Admitting: Physical Medicine & Rehabilitation

## 2019-06-03 ENCOUNTER — Ambulatory Visit: Payer: Medicare Other | Attending: Internal Medicine

## 2019-06-03 DIAGNOSIS — Z23 Encounter for immunization: Secondary | ICD-10-CM | POA: Insufficient documentation

## 2019-06-03 NOTE — Telephone Encounter (Signed)
Due to improvement, I told her she could try decreasing from BID to nightly.  Thanks.

## 2019-06-03 NOTE — Progress Notes (Signed)
   Covid-19 Vaccination Clinic  Name:  [name] [name]    MRN: [id number] DOB: 09-Aug-1924  06/03/2019  [name] was observed post Covid-19 immunization for 15 minutes without incidence. She was provided with Vaccine Information Sheet and instruction to access the V-Safe system.   [name] was instructed to call 911 with any severe reactions post vaccine: Marland Kitchen Difficulty breathing  . Swelling of your face and throat  . A fast heartbeat  . A bad rash all over your body  . Dizziness and weakness    Immunizations Administered    Name Date Dose VIS Date Route   Pfizer COVID-19 Vaccine 06/03/2019 10:37 AM 0.3 mL 04/05/2019 Intramuscular   Manufacturer: [place]   Lot: [id number]   [place]: [id number]    .co

## 2019-06-03 NOTE — Telephone Encounter (Signed)
Recieved electronic medication refill request for Baclofen 10mg  BID.  According to last note dated 05-30-2019:  Cont Baclofen 5 BID, may decrease nightly  Unsure which is proper dosing for this patient as there are 2 different dosages listed.  In addition it is unknown exactly what "may decrease nightly" is meant.  Please advise.

## 2019-06-07 ENCOUNTER — Other Ambulatory Visit: Payer: Self-pay

## 2019-06-10 ENCOUNTER — Ambulatory Visit (INDEPENDENT_AMBULATORY_CARE_PROVIDER_SITE_OTHER): Payer: Medicare Other | Admitting: Family Medicine

## 2019-06-10 ENCOUNTER — Other Ambulatory Visit: Payer: Self-pay

## 2019-06-10 ENCOUNTER — Encounter: Payer: Self-pay | Admitting: Family Medicine

## 2019-06-10 VITALS — BP 138/68 | HR 63 | Temp 98.6°F | Ht 59.0 in | Wt 163.6 lb

## 2019-06-10 DIAGNOSIS — I739 Peripheral vascular disease, unspecified: Secondary | ICD-10-CM

## 2019-06-10 DIAGNOSIS — E785 Hyperlipidemia, unspecified: Secondary | ICD-10-CM

## 2019-06-10 DIAGNOSIS — N183 Chronic kidney disease, stage 3 unspecified: Secondary | ICD-10-CM

## 2019-06-10 DIAGNOSIS — E8881 Metabolic syndrome: Secondary | ICD-10-CM

## 2019-06-10 DIAGNOSIS — I10 Essential (primary) hypertension: Secondary | ICD-10-CM | POA: Diagnosis not present

## 2019-06-10 DIAGNOSIS — R739 Hyperglycemia, unspecified: Secondary | ICD-10-CM | POA: Diagnosis not present

## 2019-06-10 DIAGNOSIS — J449 Chronic obstructive pulmonary disease, unspecified: Secondary | ICD-10-CM | POA: Diagnosis not present

## 2019-06-10 DIAGNOSIS — E88819 Insulin resistance, unspecified: Secondary | ICD-10-CM

## 2019-06-10 DIAGNOSIS — R32 Unspecified urinary incontinence: Secondary | ICD-10-CM

## 2019-06-10 LAB — POCT URINALYSIS DIPSTICK
Bilirubin, UA: NEGATIVE
Blood, UA: NEGATIVE
Glucose, UA: NEGATIVE
Ketones, UA: NEGATIVE
Nitrite, UA: NEGATIVE
Protein, UA: NEGATIVE
Spec Grav, UA: 1.02 (ref 1.010–1.025)
Urobilinogen, UA: 0.2 E.U./dL
pH, UA: 5 (ref 5.0–8.0)

## 2019-06-10 LAB — CBC WITH DIFFERENTIAL/PLATELET
Basophils Absolute: 0.1 10*3/uL (ref 0.0–0.1)
Basophils Relative: 0.8 % (ref 0.0–3.0)
Eosinophils Absolute: 0.1 10*3/uL (ref 0.0–0.7)
Eosinophils Relative: 1.7 % (ref 0.0–5.0)
HCT: 42.4 % (ref 36.0–46.0)
Hemoglobin: 14.2 g/dL (ref 12.0–15.0)
Lymphocytes Relative: 21.5 % (ref 12.0–46.0)
Lymphs Abs: 1.9 10*3/uL (ref 0.7–4.0)
MCHC: 33.4 g/dL (ref 30.0–36.0)
MCV: 84.4 fl (ref 78.0–100.0)
Monocytes Absolute: 0.6 10*3/uL (ref 0.1–1.0)
Monocytes Relative: 6.5 % (ref 3.0–12.0)
Neutro Abs: 6.3 10*3/uL (ref 1.4–7.7)
Neutrophils Relative %: 69.5 % (ref 43.0–77.0)
Platelets: 184 10*3/uL (ref 150.0–400.0)
RBC: 5.02 Mil/uL (ref 3.87–5.11)
RDW: 14.7 % (ref 11.5–15.5)
WBC: 9 10*3/uL (ref 4.0–10.5)

## 2019-06-10 LAB — HEPATIC FUNCTION PANEL
ALT: 12 U/L (ref 0–35)
AST: 17 U/L (ref 0–37)
Albumin: 4.5 g/dL (ref 3.5–5.2)
Alkaline Phosphatase: 72 U/L (ref 39–117)
Bilirubin, Direct: 0.1 mg/dL (ref 0.0–0.3)
Total Bilirubin: 0.7 mg/dL (ref 0.2–1.2)
Total Protein: 6.7 g/dL (ref 6.0–8.3)

## 2019-06-10 LAB — LDL CHOLESTEROL, DIRECT: Direct LDL: 46 mg/dL

## 2019-06-10 LAB — HEMOGLOBIN A1C: Hgb A1c MFr Bld: 6.2 % (ref 4.6–6.5)

## 2019-06-10 NOTE — Addendum Note (Signed)
Addended by: Christiana Fuchs on: 06/10/2019 11:52 AM   Modules accepted: Orders

## 2019-06-10 NOTE — Progress Notes (Signed)
Urine positive for leukocytes. Team- Under incontinence urinary please order urine culture

## 2019-06-10 NOTE — Addendum Note (Signed)
Addended by: Christiana Fuchs on: 06/10/2019 11:55 AM   Modules accepted: Orders

## 2019-06-10 NOTE — Progress Notes (Signed)
Phone [phone] In person visit   Subjective:   [name] [name] is a 86 y.o. year old very pleasant female patient who presents for/with See problem oriented charting Chief Complaint  Patient presents with  . Follow-up  . Hyperlipidemia  . Hypertension   This visit occurred during the SARS-CoV-2 public health emergency.  Safety protocols were in place, including screening questions prior to the visit, additional usage of staff PPE, and extensive cleaning of exam room while observing appropriate contact time as indicated for disinfecting solutions.   Past Medical History-  Patient Active Problem List   Diagnosis Date Noted  . Pain in right ankle and joints of right foot 08/28/2014    Priority: High  . Peripheral arterial disease ([place]) 06/29/2007    Priority: High  . CKD (chronic kidney disease), stage III 08/07/2017    Priority: Medium  . Acute gout of left foot 03/22/2016    Priority: Medium  . Trigeminal neuralgia     Priority: Medium  . Chronic low back pain 09/13/2010    Priority: Medium  . COPD mixed type ([place]) 01/22/2010    Priority: Medium  . Insulin resistance 01/06/2009    Priority: Medium  . Hyperlipidemia 10/23/2006    Priority: Medium  . Essential hypertension 10/23/2006    Priority: Medium  . ESOPHAGEAL STRICTURE 08/09/1993    Priority: Medium  . Obesity (BMI 30.0-34.9) 11/14/2017    Priority: Low  . Unilateral primary osteoarthritis, right knee 04/07/2016    Priority: Low  . Encounter for therapeutic drug monitoring 10/21/2013    Priority: Low  . Syncope 12/29/2011    Priority: Low  . Allergic rhinitis due to pollen 06/25/2010    Priority: Low  . Solitary pulmonary nodule 01/12/2010    Priority: Low  . Osteopenia 01/06/2009    Priority: Low  . Eczema 11/26/2007    Priority: Low  . Abnormality of gait 05/30/2019  . Neuropathic pain 05/30/2019  . Myalgia 08/08/2018  . Chronic pain syndrome 06/27/2018  . Gross hematuria 06/01/2018  . Failed  back syndrome 09/28/2017  . Long term (current) use of anticoagulants 02/03/2017    Medications- reviewed and updated Current Outpatient Medications  Medication Sig Dispense Refill  . acetaminophen (TYLENOL) 500 MG tablet Take 1,000 mg by mouth 2 (two) times daily. Reported on 08/31/2015    . aspirin EC 81 MG tablet Take 81 mg by mouth every evening.     Marland Kitchen atorvastatin (LIPITOR) 40 MG tablet Take 1 tablet (40 mg total) by mouth daily. 90 tablet 3  . baclofen (LIORESAL) 10 MG tablet Take 0.5 tablets (5 mg total) by mouth Nightly. 30 each 0  . colchicine (COLCRYS) 0.6 MG tablet Take 2 pills at first sign gout flare, then another pill 2 hours later if still having pain. Then take once daily until pain resolves. (Patient taking differently: Take 0.6-1.2 mg by mouth See admin instructions. Take 1.2 mg at first sign gout flare, then 0.6 mg 2 hours later if still having pain. Then take 0.6 mg once daily until pain resolves.) 30 tablet 0  . hydrochlorothiazide (HYDRODIURIL) 25 MG tablet Take 1 tablet (25 mg total) by mouth at bedtime. 90 tablet 3  . ipratropium (ATROVENT) 0.06 % nasal spray INHALE 2 SPRAYS INTO BOTH NOSTRILS THREE TIMES A DAY 15 mL 8  . irbesartan (AVAPRO) 300 MG tablet Take 1 tablet (300 mg total) by mouth daily. 90 tablet 3  . lamoTRIgine (LAMICTAL) 25 MG tablet Take 1 tablet in morning  and 2 tablets at night. 90 tablet 4  . levocetirizine (XYZAL) 5 MG tablet Take 5 mg by mouth every evening.    . Multiple Vitamin (MULTIVITAMIN WITH MINERALS) TABS tablet Take 1 tablet by mouth daily.    . OXcarbazepine (TRILEPTAL) 150 MG tablet Take 1 tablet at bedtime for 7 days, then increase to 1 tablet twice daily. 60 tablet 0  . pantoprazole (PROTONIX) 40 MG tablet Take 1 tablet by mouth twice daily 60 tablet 5  . RESTASIS 0.05 % ophthalmic emulsion Place 1 drop into both eyes 2 (two) times daily.   99  . warfarin (COUMADIN) 5 MG tablet TAKE 1 TABLETS BY MOUTH ONCE DAILY, EXCEPT TAKE 1 & 1/2  TABLETS ON Monday OR TAKE AS DIRECTED BY ANTICOAGULATION CLINIC 120 tablet 1   No current facility-administered medications for this visit.     Objective:  BP 138/68   Pulse 63   Temp 98.6 F (37 C) (Temporal)   Ht 4\' 11"  (1.499 m)   Wt 163 lb 9.6 oz (74.2 kg)   SpO2 97%   BMI 33.04 kg/m  Gen: NAD, resting comfortably CV: RRR no murmurs rubs or gallops Lungs: CTAB no crackles, wheeze, rhonchi Ext: trace edema Skin: warm, dry    Assessment and Plan   Other notes: 1. PAD/arterial thrombosis- compliant with aspirin and statin given her PAD.  She is also on warfarin through Dr. Wilhelmina Mcardle history of DVT but instead was arterial thrombosis and that is a long-term indication for Coumadin.  Denies worsening claudication- she is doing some exercises in bed before gets out of bed and then before bed.  Stable. Continue current medications.   2. Eye watering better on restasis- was able to move to a more mild eye drop from optho 3. for back pain- getting shots in her back that are working better than cymbalta was.  4. On lamictal for trigeminal neuralgia  #Social updates- doing some renovations for safety- getting a seat in the shower instead of her tub. Also doing some house painting and getting some new floors. Has done a lot of clean out in the home.    #Hyperlipidemia S: Compliant with atorvastatin 40 mg.  LDL goal 70 or less due to arterial disease. Lab Results  Component Value Date   CHOL 164 12/05/2018   HDL 58.20 12/05/2018   LDLCALC 71 12/05/2018   LDLDIRECT 48.0 05/17/2018   TRIG 173.0 (H) 12/05/2018   CHOLHDL 3 12/05/2018   A/P: LDL very close to goal of 70 or less on last check at 71-now on atorvastatin 40 mg instead of simvastatin 40 mg- update LDL today    #Essential hypertension S: Compliant with irbesartan 300 mg and hydrochlorothiazide 25 mg.  We have considered stopping hydrochlorothiazide if future gout flares-given elevated uric acid level  BP Readings from Last  3 Encounters:  06/10/19 138/68  05/16/19 125/72  05/14/19 (!) 146/94   A/P:    Stable. Continue current medications.   - no gout flares recently so we will continue hctz  #Chronic kidney disease stage III S: Patient's GFR has been stable in the 50s.  She knows to avoid NSAIDs A/P:Creatinine was just checked on May 08, 2019 and was stable in the 1.1-1.2 range at 1.17-continue to monitor  #Insulin resistance/obesity S: Patient has lost 1 pound from last visit in August.  We discussed exercise- she is doing that in morning and evening- has also cut down on sweets (leg and arm exercises in bed in  AM, does 3-5 lb weights at night- feels like arms have toned up also does 5-10 minutes on her bike) Lab Results  Component Value Date   HGBA1C 6.3 12/05/2018   HGBA1C 6.0 05/17/2018   HGBA1C 6.5 08/07/2017  A/P: Update A1c today-hopefully stable -exercises hopefully helping with DM risk but she also feels ike helps with mobility   # Completed covid 19 vaccinations!   # copd noted in past but no inhalers used- occasionally shortwinded with exercise  Recommended follow up: Return in about 6 months (around 12/08/2019) for follow up- or sooner if needed. Future Appointments  Date Time Provider [place]  07/02/2019  1:00 PM [facility] None  07/04/2019  1:00 PM [facility] - [facility]  07/04/2019  1:40 PM Elam Dutch, MD [facility]  08/29/2019 11:40 AM Jamse Arn, MD [facility] CPR  10/14/2019 10:30 AM Pieter Partridge, DO [facility] None  10/15/2019 11:00 AM Kozlow, Donnamarie Poag, MD [facility] None   Lab/Order associations:   ICD-10-CM   1. Peripheral arterial disease (HCC)  I73.9   2. Hyperlipidemia, unspecified hyperlipidemia type  E78.5   3. Insulin resistance  E88.81 Hemoglobin A1c  4. Essential hypertension  I10 CBC with Differential/Platelet    LDL cholesterol, direct    Hepatic function panel    POCT Urinalysis Dipstick (Automated)  5. COPD mixed  type ([place])  J44.9   6. Stage 3 chronic kidney disease, unspecified whether stage 3a or 3b CKD  N18.30 POCT Urinalysis Dipstick (Automated)  7. Hyperglycemia  R73.9 Hemoglobin A1c   Return precautions advised.  [name], MD

## 2019-06-10 NOTE — Addendum Note (Signed)
Addended by: Christiana Fuchs on: 06/10/2019 11:47 AM   Modules accepted: Orders

## 2019-06-10 NOTE — Addendum Note (Signed)
Addended by: Francis Dowse T on: 06/10/2019 01:25 PM   Modules accepted: Orders

## 2019-06-10 NOTE — Assessment & Plan Note (Signed)
S: Compliant with atorvastatin 40 mg.  LDL goal 70 or less due to arterial disease. Lab Results  Component Value Date   CHOL 164 12/05/2018   HDL 58.20 12/05/2018   LDLCALC 71 12/05/2018   LDLDIRECT 48.0 05/17/2018   TRIG 173.0 (H) 12/05/2018   CHOLHDL 3 12/05/2018   A/P: LDL very close to goal of 70 or less on last check at 71-now on atorvastatin 40 mg instead of simvastatin 40 mg- update LDL today

## 2019-06-10 NOTE — Patient Instructions (Addendum)
Please stop by lab before you go If you do not have mychart- we will call you about results within 5 business days of [place] receiving them.  If you have mychart- we will send your results within 3 business days of [place] receiving them.  If abnormal or we want to clarify a result, we will call or mychart you to make sure you receive the message.  If you have questions or concerns or don't hear within 5-7 days, please send [place] a message or call us.   No changes today  You are doing incredible. Great job with exercise and the weight loss.   Recommended follow up: Return in about 6 months (around 12/08/2019) for follow up- or sooner if needed.

## 2019-06-11 LAB — URINE CULTURE
MICRO NUMBER:: 10152196
SPECIMEN QUALITY:: ADEQUATE

## 2019-06-28 ENCOUNTER — Other Ambulatory Visit: Payer: Self-pay | Admitting: Allergy and Immunology

## 2019-06-28 ENCOUNTER — Telehealth: Payer: Self-pay

## 2019-06-28 NOTE — Telephone Encounter (Signed)
Patient would like to know would she need appt to have her handicap paper work completed or could she mail that to Dr. Yong Channel. Only SDA for this month until 08/07/19 at 11:20am

## 2019-06-30 ENCOUNTER — Other Ambulatory Visit: Payer: Self-pay | Admitting: Physical Medicine & Rehabilitation

## 2019-07-01 NOTE — Telephone Encounter (Signed)
Called patient no answer no voice mail. We have ppw here will send to [facility] to fill out and mail to patient.

## 2019-07-02 ENCOUNTER — Ambulatory Visit: Payer: Medicare Other | Admitting: General Practice

## 2019-07-02 NOTE — Progress Notes (Signed)
Medical screening examination/treatment/procedure(s) were performed by non-physician practitioner and as supervising physician I was immediately available for consultation/collaboration. I agree with above. Anam Bobby, MD   

## 2019-07-02 NOTE — Telephone Encounter (Signed)
Called patient would like mailed verified address

## 2019-07-03 ENCOUNTER — Telehealth (HOSPITAL_COMMUNITY): Payer: Self-pay

## 2019-07-03 NOTE — Telephone Encounter (Signed)
The above patient or their representative was contacted and gave the following answers to these questions:         Do you have any of the following symptoms?    NO  Fever                    Cough                   Shortness of breath  Do  you have any of the following other symptoms?    muscle pain         vomiting,        diarrhea        rash         weakness        red eye        abdominal pain         bruising          bruising or bleeding              joint pain           severe headache    Have you been in contact with someone who was or has been sick in the past 2 weeks?  NO  Yes                 Unsure                         Unable to assess   Does the person that you were in contact with have any of the following symptoms?   Cough         shortness of breath           muscle pain         vomiting,            diarrhea            rash            weakness           fever            red eye           abdominal pain           bruising  or  bleeding                joint pain                severe headache                 COMMENTS OR ACTION PLAN FOR THIS PATIENT:        ALL QUESTIONS WERE ANSWERED/[facility] PATIENT HAS GOTTEN BOTH COVID VACCINES

## 2019-07-04 ENCOUNTER — Ambulatory Visit (HOSPITAL_COMMUNITY)
Admission: RE | Admit: 2019-07-04 | Discharge: 2019-07-04 | Disposition: A | Discharge status: Home / Self Care | Payer: Medicare Other | Source: Ambulatory Visit | Attending: Vascular Surgery | Admitting: Vascular Surgery

## 2019-07-04 ENCOUNTER — Ambulatory Visit (INDEPENDENT_AMBULATORY_CARE_PROVIDER_SITE_OTHER): Payer: Medicare Other | Admitting: Vascular Surgery

## 2019-07-04 ENCOUNTER — Other Ambulatory Visit: Payer: Self-pay

## 2019-07-04 ENCOUNTER — Encounter: Payer: Self-pay | Admitting: Vascular Surgery

## 2019-07-04 VITALS — BP 133/97 | HR 82 | Temp 97.9°F | Resp 20 | Ht 59.0 in | Wt 166.0 lb

## 2019-07-04 DIAGNOSIS — I739 Peripheral vascular disease, unspecified: Secondary | ICD-10-CM | POA: Diagnosis not present

## 2019-07-04 NOTE — Progress Notes (Signed)
Patient is a 86 year old female who returns for follow-up today.  She previously underwent a right femoral to above-knee popliteal bypass in 2006 by [name].  This was revised and extended to the below-knee popliteal artery by me in 2010.  She has had 1 previous thrombolysis procedure in the right leg.  She has also had a previous left femoral to above-knee popliteal bypass by [name] in 2006.  She had an arteriogram in February 2020 which showed an 80% narrowing in the midportion of her right femoropopliteal bypass graft.  Additionally she had a 70% stenosis of her left common femoral artery.  She was having some mild claudication symptoms at that time.  However, in the light of her age she has been very reluctant to proceed with any revision or other interventions.  She does not have rest pain.  She is able to perform all of her activities of daily living independently.  She still drives.  She does walk without difficulty.  She is on warfarin.  Past Medical History:  Diagnosis Date  . Allergic rhinoconjunctivitis   . Allergy   . Anxiety    pt. managed- uses deep breathing   . Arthritis    low back , stenosis  . COLONIC POLYPS, RECURRENT 08/29/2006   2008 last colonoscopy. No further colonoscopy.     Marland Kitchen COPD (chronic obstructive pulmonary disease) ([place])   . Diverticulosis   . Eczema   . Environmental allergies    allergy shot- q friday in Dr. Janee Morn office. PFT's abnormal- recommended Spiriva to use preop & will d/c after surgery  . GERD (gastroesophageal reflux disease)   . Hiatal hernia   . Hyperlipidemia   . Hypertension   . Lung nodule 2011  . Peripheral vascular disease ([place])   . Shingles   . Stenosis of popliteal artery (HCC)    blood clots in legs long ago    . Trigeminal neuralgia     Past Surgical History:  Procedure Laterality Date  . ABDOMINAL AORTAGRAM N/A 06/17/2011   Procedure: ABDOMINAL Maxcine Ham;  Surgeon: Elam Dutch, MD;  Location: [facility] CATH LAB;  Service:  Cardiovascular;  Laterality: N/A;  . ABDOMINAL AORTOGRAM W/LOWER EXTREMITY Bilateral 06/22/2018   Procedure: ABDOMINAL AORTOGRAM W/LOWER EXTREMITY;  Surgeon: Elam Dutch, MD;  Location: [place] [facility];  Service: Cardiovascular;  Laterality: Bilateral;  . CATARACT EXTRACTION, BILATERAL     w IOL  . CHOLECYSTECTOMY  1998  . FEMORAL-POPLITEAL BYPASS GRAFT        x2 surgeries 1990's & 2009  . INJECTION KNEE Right Aug. 2016   Gel injection for pain  . LUMBAR FUSION  07/06/2011  . TONSILLECTOMY     as a teenager   . TUBAL LIGATION     Current Outpatient Medications on File Prior to Visit  Medication Sig Dispense Refill  . acetaminophen (TYLENOL) 500 MG tablet Take 1,000 mg by mouth 2 (two) times daily. Reported on 08/31/2015    . aspirin EC 81 MG tablet Take 81 mg by mouth every evening.     Marland Kitchen atorvastatin (LIPITOR) 40 MG tablet Take 1 tablet (40 mg total) by mouth daily. 90 tablet 3  . baclofen (LIORESAL) 10 MG tablet TAKE 1/2 (ONE-HALF) TABLET BY MOUTH ONCE DAILY NIGHTLY 30 tablet 2  . colchicine (COLCRYS) 0.6 MG tablet Take 2 pills at first sign gout flare, then another pill 2 hours later if still having pain. Then take once daily until pain resolves. (Patient taking differently: Take  0.6-1.2 mg by mouth See admin instructions. Take 1.2 mg at first sign gout flare, then 0.6 mg 2 hours later if still having pain. Then take 0.6 mg once daily until pain resolves.) 30 tablet 0  . hydrochlorothiazide (HYDRODIURIL) 25 MG tablet Take 1 tablet (25 mg total) by mouth at bedtime. 90 tablet 3  . ipratropium (ATROVENT) 0.06 % nasal spray USE 2 SPRAY(S) IN EACH NOSTRIL THREE TIMES DAILY 15 mL 3  . irbesartan (AVAPRO) 300 MG tablet Take 1 tablet (300 mg total) by mouth daily. 90 tablet 3  . lamoTRIgine (LAMICTAL) 25 MG tablet Take 1 tablet in morning and 2 tablets at night. 90 tablet 4  . levocetirizine (XYZAL) 5 MG tablet Take 5 mg by mouth every evening.    . Multiple Vitamin (MULTIVITAMIN WITH  MINERALS) TABS tablet Take 1 tablet by mouth daily.    . OXcarbazepine (TRILEPTAL) 150 MG tablet Take 1 tablet at bedtime for 7 days, then increase to 1 tablet twice daily. 60 tablet 0  . pantoprazole (PROTONIX) 40 MG tablet Take 1 tablet by mouth twice daily 60 tablet 5  . RESTASIS 0.05 % ophthalmic emulsion Place 1 drop into both eyes 2 (two) times daily.   99  . warfarin (COUMADIN) 5 MG tablet TAKE 1 TABLETS BY MOUTH ONCE DAILY, EXCEPT TAKE 1 & 1/2 TABLETS ON Monday OR TAKE AS DIRECTED BY ANTICOAGULATION CLINIC 120 tablet 1   No current facility-administered medications on file prior to visit.    Social History   Socioeconomic History  . Marital status: Single    Spouse name: Not on file  . Number of children: 4  . Years of education: 68  . Highest education level: Not on file  Occupational History  . Occupation: retired    Fish farm manager: RETIRED  Tobacco Use  . Smoking status: Former Smoker    Packs/day: 2.00    Years: 30.00    Pack years: 60.00    Types: Cigarettes    Quit date: 06/18/1993    Years since quitting: 26.0  . Smokeless tobacco: Never Used  . Tobacco comment: QUIT IN 1995  Substance and Sexual Activity  . Alcohol use: No    Alcohol/week: 0.0 standard drinks  . Drug use: No  . Sexual activity: Not on file  Other Topics Concern  . Not on file  Social History Narrative   Patient is widowed with 4 children. 2 grandkids. Lives alone.    Patient is right handed.   Patient has high school education.   Patient drinks 1 cup daily.      Retired from [employer] for 28 years, works 2 days a week until 2016.       Hobbies: volunteers at [employer], [place] from church visits people.       No caffeine.    Social Determinants of Health   Financial Resource Strain:   . Difficulty of Paying Living Expenses:   Food Insecurity:   . Worried About Charity fundraiser in the Last Year:   . Arboriculturist in the Last Year:   Transportation Needs:   . Consulting civil engineer (Medical):   Marland Kitchen Lack of Transportation (Non-Medical):   Physical Activity:   . Days of Exercise per Week:   . Minutes of Exercise per Session:   Stress:   . Feeling of Stress :   Social Connections:   . Frequency of Communication with Friends and Family:   . Frequency of  Social Gatherings with Friends and Family:   . Attends Religious Services:   . Active Member of Clubs or Organizations:   . Attends Archivist Meetings:   Marland Kitchen Marital Status:   Intimate Partner Violence:   . Fear of Current or Ex-Partner:   . Emotionally Abused:   Marland Kitchen Physically Abused:   . Sexually Abused:     Physical exam:  Vitals:   07/04/19 1340  BP: (!) 133/97  Pulse: 82  Resp: 20  Temp: 97.9 F (36.6 C)  SpO2: 98%  Weight: 166 lb (75.3 kg)  Height: 4\' 11"  (1.499 m)    Extremities: No palpable pedal or femoral pulses.  Feet are pink warm well perfused.  Data: Patient had bilateral duplex ultrasounds today which showed velocities of greater then 480 bilaterally proximal to the graft in each leg.  Assessment: Patient with short distance claudication with known proximal stenosis above her bypass graft.  Had a lengthy meaningful discussion with the patient today regarding possible revision of the bypass graft to prevent them from occluding and potentially putting her in a limb threatening situation.  However, she is very satisfied with her current walking distance.  She is very anxious that any intervention could diminish her quality of life as easily as it may improve it.  Since she is currently asymptomatic she is very reluctant to proceed with any intervention.  She understands that if the bypass occludes she could be in a limb threatening situation.  I discussed with her all the symptoms of bypass graft occlusion including worsening claudication symptoms pain in the foot with sudden onset.  She will call us if she has any new symptoms.  Otherwise she will return for follow-up in 1  year with repeat ABIs and repeat duplex exam  Plan: See above  [name], MD [facility] Office: [phone]

## 2019-07-05 ENCOUNTER — Telehealth: Payer: Self-pay | Admitting: Family Medicine

## 2019-07-05 ENCOUNTER — Other Ambulatory Visit: Payer: Self-pay | Admitting: *Deleted

## 2019-07-05 DIAGNOSIS — Z95828 Presence of other vascular implants and grafts: Secondary | ICD-10-CM

## 2019-07-05 DIAGNOSIS — I739 Peripheral vascular disease, unspecified: Secondary | ICD-10-CM

## 2019-07-05 NOTE — Telephone Encounter (Signed)
Pt states she sent in form for Dr. Yong Channel to sign requesting a signature to receive a handicapped license plate. Pt states a placard was approved but she wanted the license plate.

## 2019-07-09 ENCOUNTER — Other Ambulatory Visit: Payer: Self-pay

## 2019-07-09 ENCOUNTER — Ambulatory Visit (INDEPENDENT_AMBULATORY_CARE_PROVIDER_SITE_OTHER): Payer: Medicare Other | Admitting: General Practice

## 2019-07-09 DIAGNOSIS — Z7901 Long term (current) use of anticoagulants: Secondary | ICD-10-CM | POA: Diagnosis not present

## 2019-07-09 LAB — POCT INR: INR: 3 (ref 2.0–3.0)

## 2019-07-09 NOTE — Telephone Encounter (Signed)
Form filled out and placed in mail to be mailed back to pt.

## 2019-07-09 NOTE — Progress Notes (Signed)
Medical screening examination/treatment/procedure(s) were performed by non-physician practitioner and as supervising physician I was immediately available for consultation/collaboration. I agree with above. Odie Edmonds, MD   

## 2019-07-09 NOTE — Patient Instructions (Addendum)
Pre visit review using our clinic review tool, if applicable. No additional management support is needed unless otherwise documented below in the visit note.  Continue to take 1 tablet daily except 1 1/2 tablets on Mondays.   Re-check in 6 weeks.

## 2019-08-20 ENCOUNTER — Ambulatory Visit: Payer: Medicare Other

## 2019-08-22 ENCOUNTER — Other Ambulatory Visit: Payer: Self-pay

## 2019-08-22 ENCOUNTER — Ambulatory Visit (INDEPENDENT_AMBULATORY_CARE_PROVIDER_SITE_OTHER): Payer: Medicare Other | Admitting: General Practice

## 2019-08-22 DIAGNOSIS — Z7901 Long term (current) use of anticoagulants: Secondary | ICD-10-CM

## 2019-08-22 LAB — POCT INR: INR: 3.7 — AB (ref 2.0–3.0)

## 2019-08-22 NOTE — Progress Notes (Signed)
Medical screening examination/treatment/procedure(s) were performed by non-physician practitioner and as supervising physician I was immediately available for consultation/collaboration. I agree with above. James John, MD   

## 2019-08-22 NOTE — Patient Instructions (Addendum)
Pre visit review using our clinic review tool, if applicable. No additional management support is needed unless otherwise documented below in the visit note.  Hold coumadin today and then change dosage and take 1 tablet daily.  Re-check in 3 weeks.

## 2019-08-29 ENCOUNTER — Other Ambulatory Visit: Payer: Self-pay

## 2019-08-29 ENCOUNTER — Encounter: Payer: Self-pay | Admitting: Physical Medicine & Rehabilitation

## 2019-08-29 ENCOUNTER — Encounter: Payer: Medicare Other | Attending: Physical Medicine & Rehabilitation | Admitting: Physical Medicine & Rehabilitation

## 2019-08-29 VITALS — BP 141/76 | HR 60 | Temp 97.5°F | Ht 59.0 in | Wt 167.0 lb

## 2019-08-29 DIAGNOSIS — M5441 Lumbago with sciatica, right side: Secondary | ICD-10-CM

## 2019-08-29 DIAGNOSIS — M961 Postlaminectomy syndrome, not elsewhere classified: Secondary | ICD-10-CM

## 2019-08-29 DIAGNOSIS — M791 Myalgia, unspecified site: Secondary | ICD-10-CM | POA: Diagnosis not present

## 2019-08-29 DIAGNOSIS — G8929 Other chronic pain: Secondary | ICD-10-CM | POA: Diagnosis not present

## 2019-08-29 DIAGNOSIS — M549 Dorsalgia, unspecified: Secondary | ICD-10-CM | POA: Diagnosis not present

## 2019-08-29 DIAGNOSIS — M25561 Pain in right knee: Secondary | ICD-10-CM

## 2019-08-29 DIAGNOSIS — R269 Unspecified abnormalities of gait and mobility: Secondary | ICD-10-CM

## 2019-08-29 DIAGNOSIS — G894 Chronic pain syndrome: Secondary | ICD-10-CM | POA: Diagnosis not present

## 2019-08-29 DIAGNOSIS — M533 Sacrococcygeal disorders, not elsewhere classified: Secondary | ICD-10-CM | POA: Diagnosis not present

## 2019-08-29 DIAGNOSIS — M792 Neuralgia and neuritis, unspecified: Secondary | ICD-10-CM | POA: Diagnosis not present

## 2019-08-29 NOTE — Progress Notes (Signed)
Trigger point injection procedure note: Trigger Point Injection: Written consent was obtained for the patient. Trigger points were identified of bilateral thoracolumbosacral PSPs and gluteal muscles. The areas were cleaned with alcohol, vapocoolant spray applied, needle drawback performed, and each of  these trigger points were injected with 1cc of a mixture of 1 cc of 0.5% Marcaine and 1cc of 6mg  celestone (x8). There were no complications from the procedure, and it was well tolerated.

## 2019-08-29 NOTE — Progress Notes (Signed)
Subjective:    Patient ID: [name] [name], female    DOB: 27-May-1924, 86 y.o.   MRN: [id number]  HPI  Female with pmh/psh of PAD, COPD, DVT, HTN, Gout, trigeminal neuralgia, post herpetic neuralgia, OA of right knee with injections, lumbar fusion present for follow up of pain in her back > right leg pain.   Initially stated: Pt stated she has had back pain "all her life".  Getting progressively worse.  Movement improves the pain.  Standing and walking exacerbate the pain. Dull pain.  Non-radiating.  Intermittent.  Denies associated numbness, tingling, weakness.  Lidoderm patch help. Pain limits pt from doing things around the house.   Last clinic visit 2/4//21. Since that time, pt states her back has been hurting for about the last month. She is using heat, Lidoderm patches, TENs.  She continues to use Baclofen. Denies falls. She states she has been doing stretching exercises. Right knee is relatively controlled.  Pain Inventory Average Pain 7 Pain Right Now 7 My pain is aching  In the last 24 hours, has pain interfered with the following? General activity 7 Relation with others 5 Enjoyment of life 5 What TIME of day is your pain at its worst? morning,daytime  Sleep (in general) Poor  Pain is worse with: walking, bending, inactivity and standing Pain improves with: laying down  Relief from Meds: 0  Mobility walk with assistance use a cane how many minutes can you walk? 5-10 ability to climb steps?  yes do you drive?  yes Do you have any goals in this area?  yes  Function retired  Neuro/Psych weakness trouble walking  Prior Studies Any changes since last visit?  no  Physicians involved in your care Any changes since last visit?  no   Family History  Problem Relation Age of Onset  . Arthritis Mother   . Hypertension Mother   . Heart disease Father   . Colon polyps Father   . Breast cancer Sister   . Breast cancer Daughter   . Hypertension Son   . Lung  cancer Brother   . Colon cancer Sister   . Brain cancer Sister   . Lung cancer Sister   . Anesthesia problems Neg Hx   . Hypotension Neg Hx   . Malignant hyperthermia Neg Hx   . Pseudochol deficiency Neg Hx    Social History   Socioeconomic History  . Marital status: Single    Spouse name: Not on file  . Number of children: 4  . Years of education: 29  . Highest education level: Not on file  Occupational History  . Occupation: retired    Fish farm manager: RETIRED  Tobacco Use  . Smoking status: Former Smoker    Packs/day: 2.00    Years: 30.00    Pack years: 60.00    Types: Cigarettes    Quit date: 06/18/1993    Years since quitting: 26.2  . Smokeless tobacco: Never Used  . Tobacco comment: QUIT IN 1995  Substance and Sexual Activity  . Alcohol use: No    Alcohol/week: 0.0 standard drinks  . Drug use: No  . Sexual activity: Not on file  Other Topics Concern  . Not on file  Social History Narrative   Patient is widowed with 4 children. 2 grandkids. Lives alone.    Patient is right handed.   Patient has high school education.   Patient drinks 1 cup daily.      Retired from [employer] for 28  years, works 2 days a week until 2016.       Hobbies: volunteers at [employer], [employer] from church visits people.       No caffeine.    Social Determinants of Health   Financial Resource Strain:   . Difficulty of Paying Living Expenses:   Food Insecurity:   . Worried About Charity fundraiser in the Last Year:   . Arboriculturist in the Last Year:   Transportation Needs:   . Film/video editor (Medical):   Marland Kitchen Lack of Transportation (Non-Medical):   Physical Activity:   . Days of Exercise per Week:   . Minutes of Exercise per Session:   Stress:   . Feeling of Stress :   Social Connections:   . Frequency of Communication with Friends and Family:   . Frequency of Social Gatherings with Friends and Family:   . Attends Religious Services:   . Active Member of  Clubs or Organizations:   . Attends Archivist Meetings:   Marland Kitchen Marital Status:    Past Surgical History:  Procedure Laterality Date  . ABDOMINAL AORTAGRAM N/A 06/17/2011   Procedure: ABDOMINAL Maxcine Ham;  Surgeon: Elam Dutch, MD;  Location: [facility] CATH LAB;  Service: Cardiovascular;  Laterality: N/A;  . ABDOMINAL AORTOGRAM W/LOWER EXTREMITY Bilateral 06/22/2018   Procedure: ABDOMINAL AORTOGRAM W/LOWER EXTREMITY;  Surgeon: Elam Dutch, MD;  Location: [place] CV LAB;  Service: Cardiovascular;  Laterality: Bilateral;  . CATARACT EXTRACTION, BILATERAL     w IOL  . CHOLECYSTECTOMY  1998  . FEMORAL-POPLITEAL BYPASS GRAFT        x2 surgeries 1990's & 2009  . INJECTION KNEE Right Aug. 2016   Gel injection for pain  . LUMBAR FUSION  07/06/2011  . TONSILLECTOMY     as a teenager   . TUBAL LIGATION     Past Medical History:  Diagnosis Date  . Allergic rhinoconjunctivitis   . Allergy   . Anxiety    pt. managed- uses deep breathing   . Arthritis    low back , stenosis  . COLONIC POLYPS, RECURRENT 08/29/2006   2008 last colonoscopy. No further colonoscopy.     Marland Kitchen COPD (chronic obstructive pulmonary disease) ([place])   . Diverticulosis   . Eczema   . Environmental allergies    allergy shot- q friday in Dr. Janee Morn office. PFT's abnormal- recommended Spiriva to use preop & will d/c after surgery  . GERD (gastroesophageal reflux disease)   . Hiatal hernia   . Hyperlipidemia   . Hypertension   . Lung nodule 2011  . Peripheral vascular disease ([place])   . Shingles   . Stenosis of popliteal artery (HCC)    blood clots in legs long ago    . Trigeminal neuralgia    BP (!) 141/76   Pulse 60   Temp (!) 97.5 F (36.4 C)   Ht 4\' 11"  (1.499 m)   Wt 167 lb (75.8 kg)   SpO2 95%   BMI 33.73 kg/m   Opioid Risk Score:   Fall Risk Score:  `1  Depression screen PHQ 2/9  Depression screen [facility] 2/9 12/05/2018 08/08/2018 05/17/2018 01/05/2018 11/01/2017 02/22/2017 10/31/2016  Decreased  Interest 0 0 0 0 0 0 0  Down, Depressed, Hopeless 0 0 0 0 0 0 0  PHQ - 2 Score 0 0 0 0 0 0 0  Altered sleeping - - - - 0 - -  Tired, decreased energy - - - -  0 - -  Change in appetite - - - - 0 - -  Feeling bad or failure about yourself  - - - - 0 - -  Trouble concentrating - - - - 0 - -  Moving slowly or fidgety/restless - - - - 0 - -  Suicidal thoughts - - - - 0 - -  PHQ-9 Score - - - - 0 - -  Difficult doing work/chores - - - - Not difficult at all - -  Some recent data might be hidden   Review of Systems  HENT: Negative.   Eyes: Negative.   Respiratory: Negative.   Cardiovascular: Negative.   Gastrointestinal: Negative.        Left flank pain  Endocrine: Negative.        High blood pressure   Genitourinary: Positive for difficulty urinating.  Musculoskeletal: Positive for arthralgias, back pain, gait problem and myalgias.  Skin: Negative.   Allergic/Immunologic: Negative.   Hematological: Negative.   Psychiatric/Behavioral: Negative.       Objective:   Physical Exam Gen: NAD.  Neuro: Alert Psych: Normal mood and affect.  MSK: Gait mildly antalgic. TTP L>R gluteal muscles and lumbosacral PSP TTP L>R SI joints Neuro:Alert Strength 5/5 grossly throughout Skin: Intact. Warm and dry.    Assessment & Plan:  Female with pmhpsh of PAD, COPD, DVT, HTN, Gout, trigeminal neuralgia, post herpetic neuralgia, OA of right knee with injections, lumbar fusion present for follow up of pain in her back > right leg pain.    1. Chronic low back pain with failed back syndrome   Xray of L-spine and right SI joint reviewed, SI joint unremarkable, degenerative changes in spine with right scoliosis.    Will avoid NSAIDs due to coumadin  Stopped using Robaxin due to nausea  Unable to tolerate Gabapentin  Cont heat  Cont tylenol, limit to 2000mg /day  Cont Lidoderm patches  Cont HEP, completed PT  Cont TENS unit PRN  Continue Cymbalta to 60 mg  Continue Baclofen 5 BID,  may decrease nightly  2. Possible Sacroiliitis bilaterally  Xray unremakrable  Minimal benefit with bracing   Discussed b/l SI joint injections  3. Sleep disturbance  Significantly improved  Cont melatonin  4. Neuropathic pain  See #1  5. Gait abnormality  Improved  Cont cane   Cont HEP  Information given on core strengthening exercises, patient more compliant  6. Myalgia  Very good benefit with trigger point injections again on 1/21  Will perform again  7. Right knee pain  MRI reviewed from 2016, showing meniscal/cartilage damage  Receiving injections from Ortho, cont  Pensaid denied  Cont voltaren gel  Cont lidoderm on knee  Improved  8. Right ankle pain - likely gout flare  Lidoderm patches, pt using Voltaren gel  Xrays reviewed, relatively uremarkable  Uric acid elevated  Improved

## 2019-09-03 ENCOUNTER — Other Ambulatory Visit: Payer: Self-pay | Admitting: Family Medicine

## 2019-09-03 DIAGNOSIS — Z7901 Long term (current) use of anticoagulants: Secondary | ICD-10-CM

## 2019-09-12 ENCOUNTER — Ambulatory Visit (INDEPENDENT_AMBULATORY_CARE_PROVIDER_SITE_OTHER): Payer: Medicare Other | Admitting: General Practice

## 2019-09-12 ENCOUNTER — Other Ambulatory Visit: Payer: Self-pay

## 2019-09-12 DIAGNOSIS — Z7901 Long term (current) use of anticoagulants: Secondary | ICD-10-CM

## 2019-09-12 LAB — POCT INR: INR: 2.5 (ref 2.0–3.0)

## 2019-09-12 NOTE — Progress Notes (Signed)
Medical screening examination/treatment/procedure(s) were performed by non-physician practitioner and as supervising physician I was immediately available for consultation/collaboration. I agree with above. Joeanna Howdyshell, MD   

## 2019-09-12 NOTE — Patient Instructions (Signed)
Pre visit review using our clinic review tool, if applicable. No additional management support is needed unless otherwise documented below in the visit note.  Continue to take 1 tablet daily.  Re-check in 4 weeks.

## 2019-09-30 DIAGNOSIS — Z961 Presence of intraocular lens: Secondary | ICD-10-CM | POA: Diagnosis not present

## 2019-09-30 DIAGNOSIS — H10413 Chronic giant papillary conjunctivitis, bilateral: Secondary | ICD-10-CM | POA: Diagnosis not present

## 2019-09-30 DIAGNOSIS — H02831 Dermatochalasis of right upper eyelid: Secondary | ICD-10-CM | POA: Diagnosis not present

## 2019-09-30 DIAGNOSIS — H16223 Keratoconjunctivitis sicca, not specified as Sjogren's, bilateral: Secondary | ICD-10-CM | POA: Diagnosis not present

## 2019-09-30 DIAGNOSIS — H02834 Dermatochalasis of left upper eyelid: Secondary | ICD-10-CM | POA: Diagnosis not present

## 2019-09-30 DIAGNOSIS — H40053 Ocular hypertension, bilateral: Secondary | ICD-10-CM | POA: Diagnosis not present

## 2019-10-04 ENCOUNTER — Telehealth: Payer: Self-pay | Admitting: Neurology

## 2019-10-04 ENCOUNTER — Other Ambulatory Visit: Payer: Self-pay

## 2019-10-04 MED ORDER — LAMOTRIGINE 100 MG PO TABS: 100.0000 mg | ORAL_TABLET | Freq: Two times a day (BID) | ORAL | 0 refills | Status: DC

## 2019-10-04 MED ORDER — LAMOTRIGINE 25 MG PO TABS: ORAL_TABLET | ORAL | 0 refills | Status: DC

## 2019-10-04 NOTE — Telephone Encounter (Signed)
Patient states she has trigeminol neuralgia, Dr. Tomi Likens prescribed Lamotrigine. It worked for a while but isn't working anymore and she has been in pain and not able to sleep. Would like to know if there was something else that can be prescribed for her to help? Please call.

## 2019-10-04 NOTE — Telephone Encounter (Signed)
Please advise 

## 2019-10-04 NOTE — Telephone Encounter (Signed)
Increase lamotrigine to 50mg  twice daily for 1 week, then 100mg  twice daily.

## 2019-10-04 NOTE — Telephone Encounter (Signed)
LMOVM 4:24pm

## 2019-10-10 ENCOUNTER — Other Ambulatory Visit: Payer: Self-pay

## 2019-10-10 ENCOUNTER — Ambulatory Visit (INDEPENDENT_AMBULATORY_CARE_PROVIDER_SITE_OTHER): Payer: Medicare Other | Admitting: General Practice

## 2019-10-10 DIAGNOSIS — Z7901 Long term (current) use of anticoagulants: Secondary | ICD-10-CM

## 2019-10-10 LAB — POCT INR: INR: 2.7 (ref 2.0–3.0)

## 2019-10-10 NOTE — Patient Instructions (Signed)
Pre visit review using our clinic review tool, if applicable. No additional management support is needed unless otherwise documented below in the visit note.  Continue to take 1 tablet daily.  Re-check in 6 weeks.

## 2019-10-10 NOTE — Progress Notes (Signed)
NEUROLOGY FOLLOW UP OFFICE NOTE  [name] GAUGHRAN [id number]  HISTORY OF PRESENT ILLNESS: [name] [name] is a 86 year old female with COPD and CKD and history of DVTs on chronic Coumadin who follows up for left-sided trigeminal neuralgia.  UPDATE: Current medication: Lamotrigine 50mg  twice daily  Increased lamotrigine to 25mg  in AM and 50mg  in PM back in January.  Pain controlled for a while but then had increase in pain, so dose was increased to 100mg  twice daily a week ago.  She is doing much better.    HISTORY: She was diagnosed with right-sided trigeminal neuralgiain 2016. She was treated at that time. She was treated with carbamazepine which was stopped due to interference with Coumadin and later gabapentin. It subsequently resolved.  In 2020, she began having left sided facial. This is worse than the previous episode. It mostly involves the cheek. It is sometimes a paroxysmal stabbing pain or a constant sharp electric pain. It is daily. It gets worse later in the day. Movement aggravates it. She cannot sleep, talk on the phone or eat. Staying still helps ease it. No numbness or tingling. No facial weakness. No associated rash. No change in hearing or vision.   Past medications: Gabapentin (unable to tolerate), carbamazepine (interfered with Coumadin), oxcarbazepine (drowsiness); Cymbalta (not used for this), tramadol(caused nausea); baclofen (for back pain)  PAST MEDICAL HISTORY: Past Medical History:  Diagnosis Date  . Allergic rhinoconjunctivitis   . Allergy   . Anxiety    pt. managed- uses deep breathing   . Arthritis    low back , stenosis  . COLONIC POLYPS, RECURRENT 08/29/2006   2008 last colonoscopy. No further colonoscopy.     Marland Kitchen COPD (chronic obstructive pulmonary disease) ([place])   . Diverticulosis   . Eczema   . Environmental allergies    allergy shot- q friday in Dr. Janee Morn office. PFT's abnormal- recommended Spiriva to use preop & will d/c  after surgery  . GERD (gastroesophageal reflux disease)   . Hiatal hernia   . Hyperlipidemia   . Hypertension   . Lung nodule 2011  . Peripheral vascular disease ([place])   . Shingles   . Stenosis of popliteal artery (HCC)    blood clots in legs long ago    . Trigeminal neuralgia     MEDICATIONS: Current Outpatient Medications on File Prior to Visit  Medication Sig Dispense Refill  . acetaminophen (TYLENOL) 500 MG tablet Take 1,000 mg by mouth 2 (two) times daily. Reported on 08/31/2015    . aspirin EC 81 MG tablet Take 81 mg by mouth every evening.     Marland Kitchen atorvastatin (LIPITOR) 40 MG tablet Take 1 tablet (40 mg total) by mouth daily. 90 tablet 3  . baclofen (LIORESAL) 10 MG tablet TAKE 1/2 (ONE-HALF) TABLET BY MOUTH ONCE DAILY NIGHTLY 30 tablet 2  . colchicine (COLCRYS) 0.6 MG tablet Take 2 pills at first sign gout flare, then another pill 2 hours later if still having pain. Then take once daily until pain resolves. (Patient taking differently: Take 0.6-1.2 mg by mouth See admin instructions. Take 1.2 mg at first sign gout flare, then 0.6 mg 2 hours later if still having pain. Then take 0.6 mg once daily until pain resolves.) 30 tablet 0  . hydrochlorothiazide (HYDRODIURIL) 25 MG tablet Take 1 tablet (25 mg total) by mouth at bedtime. 90 tablet 3  . ipratropium (ATROVENT) 0.06 % nasal spray USE 2 SPRAY(S) IN EACH NOSTRIL THREE TIMES DAILY 15 mL 3  .  irbesartan (AVAPRO) 300 MG tablet Take 1 tablet (300 mg total) by mouth daily. 90 tablet 3  . lamoTRIgine (LAMICTAL) 100 MG tablet Take 1 tablet (100 mg total) by mouth 2 (two) times daily. 60 tablet 0  . lamoTRIgine (LAMICTAL) 25 MG tablet Take 50mg  twice daily for 1 week 28 tablet 0  . levocetirizine (XYZAL) 5 MG tablet Take 5 mg by mouth every evening.    . Multiple Vitamin (MULTIVITAMIN WITH MINERALS) TABS tablet Take 1 tablet by mouth daily.    . OXcarbazepine (TRILEPTAL) 150 MG tablet Take 1 tablet at bedtime for 7 days, then increase to 1  tablet twice daily. 60 tablet 0  . pantoprazole (PROTONIX) 40 MG tablet Take 1 tablet by mouth twice daily 60 tablet 5  . RESTASIS 0.05 % ophthalmic emulsion Place 1 drop into both eyes 2 (two) times daily.   99  . warfarin (COUMADIN) 5 MG tablet TAKE 1 TABLET BY MOUTH ONCE DAILY, EXCEPT TAKE 1 & 1/2 TABLETS ON MONDAY OR TAKE AS DIRECTED BY ANTICOAGULATION CLINIC 120 tablet 0   No current facility-administered medications on file prior to visit.    ALLERGIES: Allergies  Allergen Reactions  . Latex Itching and Rash  . Sulfa Antibiotics Other (See Comments)    Cold sweat light headed and disorientation  . Tiotropium Bromide Shortness Of Breath and Other (See Comments)    Sore throat also  . Ultram [Tramadol] Nausea And Vomiting    FAMILY HISTORY: Family History  Problem Relation Age of Onset  . Arthritis Mother   . Hypertension Mother   . Heart disease Father   . Colon polyps Father   . Breast cancer Sister   . Breast cancer Daughter   . Hypertension Son   . Lung cancer Brother   . Colon cancer Sister   . Brain cancer Sister   . Lung cancer Sister   . Anesthesia problems Neg Hx   . Hypotension Neg Hx   . Malignant hyperthermia Neg Hx   . Pseudochol deficiency Neg Hx     SOCIAL HISTORY: Social History   Socioeconomic History  . Marital status: Single    Spouse name: Not on file  . Number of children: 4  . Years of education: 12  . Highest education level: Not on file  Occupational History  . Occupation: retired    Fish farm manager: RETIRED  Tobacco Use  . Smoking status: Former Smoker    Packs/day: 2.00    Years: 30.00    Pack years: 60.00    Types: Cigarettes    Quit date: 06/18/1993    Years since quitting: 26.3  . Smokeless tobacco: Never Used  . Tobacco comment: QUIT IN 1995  Vaping Use  . Vaping Use: Never used  Substance and Sexual Activity  . Alcohol use: No    Alcohol/week: 0.0 standard drinks  . Drug use: No  . Sexual activity: Not on file  Other  Topics Concern  . Not on file  Social History Narrative   Patient is widowed with 4 children. 2 grandkids. Lives alone.    Patient is right handed.   Patient has high school education.   Patient drinks 1 cup daily.      Retired from [employer] for 28 years, works 2 days a week until 2016.       Hobbies: volunteers at [employer], [place] from church visits people.       No caffeine.    Social Determinants of Health  Financial Resource Strain:   . Difficulty of Paying Living Expenses:   Food Insecurity:   . Worried About Charity fundraiser in the Last Year:   . Arboriculturist in the Last Year:   Transportation Needs:   . Film/video editor (Medical):   Marland Kitchen Lack of Transportation (Non-Medical):   Physical Activity:   . Days of Exercise per Week:   . Minutes of Exercise per Session:   Stress:   . Feeling of Stress :   Social Connections:   . Frequency of Communication with Friends and Family:   . Frequency of Social Gatherings with Friends and Family:   . Attends Religious Services:   . Active Member of Clubs or Organizations:   . Attends Archivist Meetings:   Marland Kitchen Marital Status:   Intimate Partner Violence:   . Fear of Current or Ex-Partner:   . Emotionally Abused:   Marland Kitchen Physically Abused:   . Sexually Abused:     PHYSICAL EXAM: Blood pressure 111/68, pulse (!) 120, height 4\' 11"  (1.499 m), weight 166 lb 6.4 oz (75.5 kg), SpO2 96 %. General: No acute distress.  Patient appears well-groomed.   Head:  Normocephalic/atraumatic Eyes:  Fundi examined but not visualized Neck: supple, no paraspinal tenderness, full range of motion Heart:  Regular rate and rhythm Lungs:  Clear to auscultation bilaterally Back: No paraspinal tenderness Neurological Exam: alert and oriented to person, place, and time. Attention span and concentration intact, recent and remote memory intact, fund of knowledge intact.  Speech fluent and not dysarthric, language intact.   CN II-XII intact. Bulk and tone normal, muscle strength 5/5 throughout.  Sensation to light touch intact.  Deep tendon reflexes absent throughout.  Finger to nose testing intact.  Cautious gait.  IMPRESSION: Left-sided trigeminal neuralgia  PLAN: Lamotrigine 100mg  twice daily Follow up in 4 months  Metta Clines, DO  CC: [name], MD

## 2019-10-10 NOTE — Progress Notes (Signed)
Medical screening examination/treatment/procedure(s) were performed by non-physician practitioner and as supervising physician I was immediately available for consultation/collaboration. I agree with above. Corry Ihnen, MD   

## 2019-10-14 ENCOUNTER — Other Ambulatory Visit: Payer: Self-pay

## 2019-10-14 ENCOUNTER — Telehealth: Payer: Self-pay | Admitting: Orthopaedic Surgery

## 2019-10-14 ENCOUNTER — Ambulatory Visit (INDEPENDENT_AMBULATORY_CARE_PROVIDER_SITE_OTHER): Payer: Medicare Other | Admitting: Neurology

## 2019-10-14 ENCOUNTER — Encounter: Payer: Self-pay | Admitting: Neurology

## 2019-10-14 VITALS — BP 111/68 | HR 120 | Ht 59.0 in | Wt 166.4 lb

## 2019-10-14 DIAGNOSIS — G500 Trigeminal neuralgia: Secondary | ICD-10-CM

## 2019-10-14 MED ORDER — LAMOTRIGINE 100 MG PO TABS: 100.0000 mg | ORAL_TABLET | Freq: Two times a day (BID) | ORAL | 5 refills | Status: DC

## 2019-10-14 NOTE — Telephone Encounter (Signed)
yes

## 2019-10-14 NOTE — Patient Instructions (Signed)
1.  Continue lamotrigine 100mg  twice daily 2.  Follow up in 4 months

## 2019-10-14 NOTE — Telephone Encounter (Signed)
Patient called. She would like a gel injection. Her call back number is [phone]

## 2019-10-14 NOTE — Telephone Encounter (Signed)
Patient had right knee gel injection 02/2019 and would like it repeated. OK to obtain authorization and repeat?

## 2019-10-15 ENCOUNTER — Ambulatory Visit: Payer: Medicare Other | Admitting: Allergy and Immunology

## 2019-10-15 NOTE — Telephone Encounter (Signed)
Noted  

## 2019-10-15 NOTE — Telephone Encounter (Signed)
Please obtain authorization for right knee gel injection. Dr. [name]. Thanks.

## 2019-10-22 ENCOUNTER — Other Ambulatory Visit: Payer: Self-pay | Admitting: Allergy and Immunology

## 2019-10-30 ENCOUNTER — Telehealth: Payer: Self-pay

## 2019-10-30 NOTE — Telephone Encounter (Signed)
Submitted VOB, SynviscOne, right knee. 

## 2019-10-31 ENCOUNTER — Telehealth: Payer: Self-pay

## 2019-10-31 NOTE — Telephone Encounter (Signed)
Patient is aware that she is approved for gel injection.  Approved,SynviscOne, right knee. Buy & Bill Covered at 100% through secondary insurance Nurse, mental health) after [employer] pays. No Co-pay No PA required

## 2019-11-08 ENCOUNTER — Other Ambulatory Visit: Payer: Self-pay | Admitting: Neurology

## 2019-11-12 ENCOUNTER — Ambulatory Visit: Payer: Medicare Other | Admitting: Allergy and Immunology

## 2019-11-13 ENCOUNTER — Telehealth: Payer: Self-pay

## 2019-11-13 NOTE — Telephone Encounter (Signed)
Pt called and is wanting to move forward with scheduling procedure. She has c/o R leg/foot pain primarily when standing on it for about 1 month. I have made MD aware; will schedule her a f/u appt or schedule surgery. Pt is aware and verbalized understanding.

## 2019-11-14 ENCOUNTER — Encounter: Payer: Self-pay | Admitting: Orthopaedic Surgery

## 2019-11-14 ENCOUNTER — Ambulatory Visit (INDEPENDENT_AMBULATORY_CARE_PROVIDER_SITE_OTHER): Payer: Medicare Other | Admitting: Orthopaedic Surgery

## 2019-11-14 DIAGNOSIS — M1711 Unilateral primary osteoarthritis, right knee: Secondary | ICD-10-CM

## 2019-11-14 MED ORDER — HYLAN G-F 20 48 MG/6ML IX SOSY
48.0000 mg | PREFILLED_SYRINGE | INTRA_ARTICULAR | Status: AC | PRN
  Administered 2019-11-14: 48 mg via INTRA_ARTICULAR

## 2019-11-14 NOTE — Progress Notes (Signed)
   Procedure Note  Patient: [name] [name]             Date of Birth: 1924-11-06           MRN: [id number]             Visit Date: 11/14/2019  Procedures: Visit Diagnoses:  1. Unilateral primary osteoarthritis, right knee     Large Joint Inj: R knee on 11/14/2019 2:11 PM Indications: pain Details: 22 G needle  Arthrogram: No  Medications: 48 mg Hylan 48 MG/6ML Outcome: tolerated well, no immediate complications Patient was prepped and draped in the usual sterile fashion.

## 2019-11-15 ENCOUNTER — Other Ambulatory Visit: Payer: Self-pay | Admitting: *Deleted

## 2019-11-15 DIAGNOSIS — I739 Peripheral vascular disease, unspecified: Secondary | ICD-10-CM

## 2019-11-15 DIAGNOSIS — Z95828 Presence of other vascular implants and grafts: Secondary | ICD-10-CM

## 2019-11-15 DIAGNOSIS — I779 Disorder of arteries and arterioles, unspecified: Secondary | ICD-10-CM

## 2019-11-18 ENCOUNTER — Other Ambulatory Visit: Payer: Self-pay

## 2019-11-18 ENCOUNTER — Ambulatory Visit (HOSPITAL_COMMUNITY)
Admission: RE | Admit: 2019-11-18 | Discharge: 2019-11-18 | Disposition: A | Discharge status: Home / Self Care | Payer: Medicare Other | Source: Ambulatory Visit | Attending: Vascular Surgery | Admitting: Vascular Surgery

## 2019-11-18 ENCOUNTER — Ambulatory Visit (INDEPENDENT_AMBULATORY_CARE_PROVIDER_SITE_OTHER)
Admission: RE | Admit: 2019-11-18 | Discharge: 2019-11-18 | Disposition: A | Discharge status: Home / Self Care | Payer: Medicare Other | Source: Ambulatory Visit | Attending: Vascular Surgery | Admitting: Vascular Surgery

## 2019-11-18 DIAGNOSIS — I739 Peripheral vascular disease, unspecified: Secondary | ICD-10-CM

## 2019-11-18 DIAGNOSIS — I779 Disorder of arteries and arterioles, unspecified: Secondary | ICD-10-CM | POA: Diagnosis not present

## 2019-11-18 DIAGNOSIS — Z95828 Presence of other vascular implants and grafts: Secondary | ICD-10-CM | POA: Diagnosis not present

## 2019-11-21 ENCOUNTER — Encounter: Payer: Self-pay | Admitting: Vascular Surgery

## 2019-11-21 ENCOUNTER — Other Ambulatory Visit: Payer: Self-pay

## 2019-11-21 ENCOUNTER — Ambulatory Visit (INDEPENDENT_AMBULATORY_CARE_PROVIDER_SITE_OTHER): Payer: Medicare Other | Admitting: Vascular Surgery

## 2019-11-21 ENCOUNTER — Telehealth: Payer: Self-pay | Admitting: Cardiovascular Disease

## 2019-11-21 ENCOUNTER — Ambulatory Visit (INDEPENDENT_AMBULATORY_CARE_PROVIDER_SITE_OTHER): Payer: Medicare Other | Admitting: General Practice

## 2019-11-21 VITALS — BP 112/64 | HR 84 | Temp 97.3°F | Ht 59.0 in | Wt 164.6 lb

## 2019-11-21 DIAGNOSIS — I779 Disorder of arteries and arterioles, unspecified: Secondary | ICD-10-CM | POA: Diagnosis not present

## 2019-11-21 DIAGNOSIS — Z7901 Long term (current) use of anticoagulants: Secondary | ICD-10-CM

## 2019-11-21 DIAGNOSIS — I739 Peripheral vascular disease, unspecified: Secondary | ICD-10-CM

## 2019-11-21 LAB — POCT INR: INR: 1.8 — AB (ref 2.0–3.0)

## 2019-11-21 NOTE — H&P (View-Only) (Signed)
Patient is a 86 year old female who returns for follow-up today.  She was last seen in March 2021.  At that time she had a known narrowing of her bypass but deferred an intervention on it because she was worried about having an operation at her age.  About 4 to 6 weeks ago she began to have very short distance claudication followed by rest pain in her right foot.  This has not improved.  She is having difficulty walking now because the foot does not feel normal.  She previously underwent a right femoral to above-knee popliteal bypass in 2006 by [name].  This was revised and extended to the below-knee popliteal artery by me in 2010.  She has had 1 prior thrombolysis procedure in the right leg.  She also has had a previous left femoral to above-knee popliteal bypass by [name] in 2006.  She had an arteriogram in February 2020 which showed an 80% narrowing of the midportion of her right femoropopliteal bypass graft.  She also had a 70% stenosis of her left common femoral artery.  She was having mild claudication symptoms at that time.  She was reluctant to proceed with any intervention due to her age.  She is currently on warfarin which she was started on after her thrombolysis procedure several years ago.  She is also on aspirin and a statin.  Past Medical History:  Diagnosis Date  . Allergic rhinoconjunctivitis   . Allergy   . Anxiety    pt. managed- uses deep breathing   . Arthritis    low back , stenosis  . COLONIC POLYPS, RECURRENT 08/29/2006   2008 last colonoscopy. No further colonoscopy.     Marland Kitchen COPD (chronic obstructive pulmonary disease) ([place])   . Diverticulosis   . Eczema   . Environmental allergies    allergy shot- q friday in Dr. Janee Morn office. PFT's abnormal- recommended Spiriva to use preop & will d/c after surgery  . GERD (gastroesophageal reflux disease)   . Hiatal hernia   . Hyperlipidemia   . Hypertension   . Lung nodule 2011  . Peripheral vascular disease ([place])   .  Shingles   . Stenosis of popliteal artery (HCC)    blood clots in legs long ago    . Trigeminal neuralgia     Past Surgical History:  Procedure Laterality Date  . ABDOMINAL AORTAGRAM N/A 06/17/2011   Procedure: ABDOMINAL Maxcine Ham;  Surgeon: Elam Dutch, MD;  Location: [facility] CATH LAB;  Service: Cardiovascular;  Laterality: N/A;  . ABDOMINAL AORTOGRAM W/LOWER EXTREMITY Bilateral 06/22/2018   Procedure: ABDOMINAL AORTOGRAM W/LOWER EXTREMITY;  Surgeon: Elam Dutch, MD;  Location: [facility] CV LAB;  Service: Cardiovascular;  Laterality: Bilateral;  . CATARACT EXTRACTION, BILATERAL     w IOL  . CHOLECYSTECTOMY  1998  . FEMORAL-POPLITEAL BYPASS GRAFT        x2 surgeries 1990's & 2009  . INJECTION KNEE Right Aug. 2016   Gel injection for pain  . LUMBAR FUSION  07/06/2011  . TONSILLECTOMY     as a teenager   . TUBAL LIGATION      Current Outpatient Medications on File Prior to Visit  Medication Sig Dispense Refill  . acetaminophen (TYLENOL) 500 MG tablet Take 1,000 mg by mouth 2 (two) times daily. Reported on 08/31/2015    . aspirin EC 81 MG tablet Take 81 mg by mouth every evening.     Marland Kitchen atorvastatin (LIPITOR) 40 MG tablet Take 1 tablet (40 mg  total) by mouth daily. 90 tablet 3  . baclofen (LIORESAL) 10 MG tablet TAKE 1/2 (ONE-HALF) TABLET BY MOUTH ONCE DAILY NIGHTLY 30 tablet 2  . colchicine (COLCRYS) 0.6 MG tablet Take 2 pills at first sign gout flare, then another pill 2 hours later if still having pain. Then take once daily until pain resolves. (Patient taking differently: Take 0.6-1.2 mg by mouth See admin instructions. Take 1.2 mg at first sign gout flare, then 0.6 mg 2 hours later if still having pain. Then take 0.6 mg once daily until pain resolves.) 30 tablet 0  . hydrochlorothiazide (HYDRODIURIL) 25 MG tablet Take 1 tablet (25 mg total) by mouth at bedtime. 90 tablet 3  . irbesartan (AVAPRO) 300 MG tablet Take 1 tablet (300 mg total) by mouth daily. 90 tablet 3  .  lamoTRIgine (LAMICTAL) 100 MG tablet Take 1 tablet by mouth twice daily 60 tablet 5  . Multiple Vitamin (MULTIVITAMIN WITH MINERALS) TABS tablet Take 1 tablet by mouth daily.    . pantoprazole (PROTONIX) 40 MG tablet Take 1 tablet by mouth twice daily 60 tablet 0  . warfarin (COUMADIN) 5 MG tablet TAKE 1 TABLET BY MOUTH ONCE DAILY, EXCEPT TAKE 1 & 1/2 TABLETS ON MONDAY OR TAKE AS DIRECTED BY ANTICOAGULATION CLINIC 120 tablet 0  . ipratropium (ATROVENT) 0.06 % nasal spray USE 2 SPRAY(S) IN EACH NOSTRIL THREE TIMES DAILY (Patient not taking: Reported on 11/21/2019) 15 mL 3  . levocetirizine (XYZAL) 5 MG tablet Take 5 mg by mouth every evening. (Patient not taking: Reported on 11/21/2019)    . RESTASIS 0.05 % ophthalmic emulsion Place 1 drop into both eyes 2 (two) times daily.  (Patient not taking: Reported on 11/21/2019)  99   No current facility-administered medications on file prior to visit.   Physical exam:  Vitals:   11/21/19 1516  BP: (!) 112/64  Pulse: 84  Temp: (!) 97.3 F (36.3 C)  TempSrc: Skin  SpO2: 94%  Weight: 164 lb 9.6 oz (74.7 kg)  Height: 4\' 11"  (1.499 m)    Extremities: Feet are symmetrically pink, no ulcer, no pedal pulses bilaterally  Data: Patient had bilateral ABIs performed on November 18, 2019.  Right side was 0.34 left was 0.51 right side has declined from 0.55 previously.  Duplex ultrasound shows the right femoropopliteal is now occluded.  I reviewed and interpreted the studies.  Assessment: Occlusion right femoral to below-knee popliteal bypass now with rest pain right foot.  She has limb threatening ischemia.  All this was discussed with the patient and her daughter today.  Plan: Patient will be scheduled for aortogram lower extremity runoff on Friday November 29, 2018.  We will stop her warfarin prior to this.  Since she was on this for her bypass graft we probably will not resume it post procedure depending on the findings.  We will get her scheduled for cardiac or  stratification as soon as possible with a plan to redo her right leg bypass on December 09, 2018.  She wished to defer this till after 14 August due to a family funeral that was pending.  Risk benefits possible complications and procedure details were discussed with the patient today as far as arteriogram and bypass were concerned.  She still had some concerns with general anesthesia with her age.  We had a realistic discussion regarding the possibility that she could potentially die from the operation but that without an operation she would be at risk for limb loss.  We  also discussed the possibility of bleeding infection myocardial events respiratory events.  I also discussed with her that there is a high likelihood she would need to go to rehab postoperatively.  She and her daughter understand all of these risks as well as the benefits and they wish to proceed.  [name], MD [facility] Office: [phone]

## 2019-11-21 NOTE — Progress Notes (Signed)
Medical screening examination/treatment/procedure(s) were performed by non-physician practitioner and as supervising physician I was immediately available for consultation/collaboration. I agree with above. Megyn Leng, MD   

## 2019-11-21 NOTE — Patient Instructions (Addendum)
Pre visit review using our clinic review tool, if applicable. No additional management support is needed unless otherwise documented below in the visit note.  Take 1 1/2 tablets today and tomorrow and then continue to take 1 tablet daily.  Re-check in 3 weeks.

## 2019-11-21 NOTE — H&P (View-Only) (Signed)
Patient is a 86 year old female who returns for follow-up today.  She was last seen in March 2021.  At that time she had a known narrowing of her bypass but deferred an intervention on it because she was worried about having an operation at her age.  About 4 to 6 weeks ago she began to have very short distance claudication followed by rest pain in her right foot.  This has not improved.  She is having difficulty walking now because the foot does not feel normal.  She previously underwent a right femoral to above-knee popliteal bypass in 2006 by [name].  This was revised and extended to the below-knee popliteal artery by me in 2010.  She has had 1 prior thrombolysis procedure in the right leg.  She also has had a previous left femoral to above-knee popliteal bypass by [name] in 2006.  She had an arteriogram in February 2020 which showed an 80% narrowing of the midportion of her right femoropopliteal bypass graft.  She also had a 70% stenosis of her left common femoral artery.  She was having mild claudication symptoms at that time.  She was reluctant to proceed with any intervention due to her age.  She is currently on warfarin which she was started on after her thrombolysis procedure several years ago.  She is also on aspirin and a statin.  Past Medical History:  Diagnosis Date  . Allergic rhinoconjunctivitis   . Allergy   . Anxiety    pt. managed- uses deep breathing   . Arthritis    low back , stenosis  . COLONIC POLYPS, RECURRENT 08/29/2006   2008 last colonoscopy. No further colonoscopy.     Marland Kitchen COPD (chronic obstructive pulmonary disease) ([place])   . Diverticulosis   . Eczema   . Environmental allergies    allergy shot- q friday in Dr. Janee Morn office. PFT's abnormal- recommended Spiriva to use preop & will d/c after surgery  . GERD (gastroesophageal reflux disease)   . Hiatal hernia   . Hyperlipidemia   . Hypertension   . Lung nodule 2011  . Peripheral vascular disease ([place])   .  Shingles   . Stenosis of popliteal artery (H[facility])    blood clots in legs long ago    . Trigeminal neuralgia     Past Surgical History:  Procedure Laterality Date  . ABDOMINAL AORTAGRAM N/A 06/17/2011   Procedure: ABDOMINAL Maxcine Ham;  Surgeon: Elam Dutch, MD;  Location: [facility] CATH LAB;  Service: Cardiovascular;  Laterality: N/A;  . ABDOMINAL AORTOGRAM W/LOWER EXTREMITY Bilateral 06/22/2018   Procedure: ABDOMINAL AORTOGRAM W/LOWER EXTREMITY;  Surgeon: Elam Dutch, MD;  Location: [place] CV LAB;  Service: Cardiovascular;  Laterality: Bilateral;  . CATARACT EXTRACTION, BILATERAL     w IOL  . CHOLECYSTECTOMY  1998  . FEMORAL-POPLITEAL BYPASS GRAFT        x2 surgeries 1990's & 2009  . INJECTION KNEE Right Aug. 2016   Gel injection for pain  . LUMBAR FUSION  07/06/2011  . TONSILLECTOMY     as a teenager   . TUBAL LIGATION      Current Outpatient Medications on File Prior to Visit  Medication Sig Dispense Refill  . acetaminophen (TYLENOL) 500 MG tablet Take 1,000 mg by mouth 2 (two) times daily. Reported on 08/31/2015    . aspirin EC 81 MG tablet Take 81 mg by mouth every evening.     Marland Kitchen atorvastatin (LIPITOR) 40 MG tablet Take 1 tablet (40 mg  total) by mouth daily. 90 tablet 3  . baclofen (LIORESAL) 10 MG tablet TAKE 1/2 (ONE-HALF) TABLET BY MOUTH ONCE DAILY NIGHTLY 30 tablet 2  . colchicine (COLCRYS) 0.6 MG tablet Take 2 pills at first sign gout flare, then another pill 2 hours later if still having pain. Then take once daily until pain resolves. (Patient taking differently: Take 0.6-1.2 mg by mouth See admin instructions. Take 1.2 mg at first sign gout flare, then 0.6 mg 2 hours later if still having pain. Then take 0.6 mg once daily until pain resolves.) 30 tablet 0  . hydrochlorothiazide (HYDRODIURIL) 25 MG tablet Take 1 tablet (25 mg total) by mouth at bedtime. 90 tablet 3  . irbesartan (AVAPRO) 300 MG tablet Take 1 tablet (300 mg total) by mouth daily. 90 tablet 3  .  lamoTRIgine (LAMICTAL) 100 MG tablet Take 1 tablet by mouth twice daily 60 tablet 5  . Multiple Vitamin (MULTIVITAMIN WITH MINERALS) TABS tablet Take 1 tablet by mouth daily.    . pantoprazole (PROTONIX) 40 MG tablet Take 1 tablet by mouth twice daily 60 tablet 0  . warfarin (COUMADIN) 5 MG tablet TAKE 1 TABLET BY MOUTH ONCE DAILY, EXCEPT TAKE 1 & 1/2 TABLETS ON MONDAY OR TAKE AS DIRECTED BY ANTICOAGULATION CLINIC 120 tablet 0  . ipratropium (ATROVENT) 0.06 % nasal spray USE 2 SPRAY(S) IN EACH NOSTRIL THREE TIMES DAILY (Patient not taking: Reported on 11/21/2019) 15 mL 3  . levocetirizine (XYZAL) 5 MG tablet Take 5 mg by mouth every evening. (Patient not taking: Reported on 11/21/2019)    . RESTASIS 0.05 % ophthalmic emulsion Place 1 drop into both eyes 2 (two) times daily.  (Patient not taking: Reported on 11/21/2019)  99   No current facility-administered medications on file prior to visit.   Physical exam:  Vitals:   11/21/19 1516  BP: (!) 112/64  Pulse: 84  Temp: (!) 97.3 F (36.3 C)  TempSrc: Skin  SpO2: 94%  Weight: 164 lb 9.6 oz (74.7 kg)  Height: 4\' 11"  (1.499 m)    Extremities: Feet are symmetrically pink, no ulcer, no pedal pulses bilaterally  Data: Patient had bilateral ABIs performed on November 18, 2019.  Right side was 0.34 left was 0.51 right side has declined from 0.55 previously.  Duplex ultrasound shows the right femoropopliteal is now occluded.  I reviewed and interpreted the studies.  Assessment: Occlusion right femoral to below-knee popliteal bypass now with rest pain right foot.  She has limb threatening ischemia.  All this was discussed with the patient and her daughter today.  Plan: Patient will be scheduled for aortogram lower extremity runoff on Friday November 29, 2018.  We will stop her warfarin prior to this.  Since she was on this for her bypass graft we probably will not resume it post procedure depending on the findings.  We will get her scheduled for cardiac or  stratification as soon as possible with a plan to redo her right leg bypass on December 09, 2018.  She wished to defer this till after 14 August due to a family funeral that was pending.  Risk benefits possible complications and procedure details were discussed with the patient today as far as arteriogram and bypass were concerned.  She still had some concerns with general anesthesia with her age.  We had a realistic discussion regarding the possibility that she could potentially die from the operation but that without an operation she would be at risk for limb loss.  We  also discussed the possibility of bleeding infection myocardial events respiratory events.  I also discussed with her that there is a high likelihood she would need to go to rehab postoperatively.  She and her daughter understand all of these risks as well as the benefits and they wish to proceed.  [name], MD [facility] Office: [phone]

## 2019-11-21 NOTE — Telephone Encounter (Signed)
New Message  Jeani Hawking from [facility] is calling in because patient needs a new patient preop op clearance appointment. Patient prefers to see Dr. Claiborne Billings due to them being good friends of Dr. Claiborne Billings and would like to be fit in the schedule if possible. First available with Dr. Claiborne Billings is in November 2021. Please assist if possible.  Jeani Hawking or [place] ([facility] [phone] (phone number) [phone] (fax number- Attn: Jeani Hawking)

## 2019-11-21 NOTE — Progress Notes (Signed)
Patient is a 86 year old female who returns for follow-up today.  She was last seen in March 2021.  At that time she had a known narrowing of her bypass but deferred an intervention on it because she was worried about having an operation at her age.  About 4 to 6 weeks ago she began to have very short distance claudication followed by rest pain in her right foot.  This has not improved.  She is having difficulty walking now because the foot does not feel normal.  She previously underwent a right femoral to above-knee popliteal bypass in 2006 by [name].  This was revised and extended to the below-knee popliteal artery by me in 2010.  She has had 1 prior thrombolysis procedure in the right leg.  She also has had a previous left femoral to above-knee popliteal bypass by [name] in 2006.  She had an arteriogram in February 2020 which showed an 80% narrowing of the midportion of her right femoropopliteal bypass graft.  She also had a 70% stenosis of her left common femoral artery.  She was having mild claudication symptoms at that time.  She was reluctant to proceed with any intervention due to her age.  She is currently on warfarin which she was started on after her thrombolysis procedure several years ago.  She is also on aspirin and a statin.  Past Medical History:  Diagnosis Date   Allergic rhinoconjunctivitis    Allergy    Anxiety    pt. managed- uses deep breathing    Arthritis    low back , stenosis   COLONIC POLYPS, RECURRENT 08/29/2006   2008 last colonoscopy. No further colonoscopy.      COPD (chronic obstructive pulmonary disease) (HCC)    Diverticulosis    Eczema    Environmental allergies    allergy shot- q friday in Dr. Janee Morn office. PFT's abnormal- recommended Spiriva to use preop & will d/c after surgery   GERD (gastroesophageal reflux disease)    Hiatal hernia    Hyperlipidemia    Hypertension    Lung nodule 2011   Peripheral vascular disease ([place])     Shingles    Stenosis of popliteal artery (HCC)    blood clots in legs long ago     Trigeminal neuralgia     Past Surgical History:  Procedure Laterality Date   ABDOMINAL AORTAGRAM N/A 06/17/2011   Procedure: ABDOMINAL Maxcine Ham;  Surgeon: Elam Dutch, MD;  [facility] CATH LAB;  Service: Cardiovascular;  Laterality: N/A;   ABDOMINAL AORTOGRAM W/LOWER EXTREMITY Bilateral 06/22/2018   Procedure: ABDOMINAL AORTOGRAM W/LOWER EXTREMITY;  Surgeon: Elam Dutch, MD;  Location: [place] CV LAB;  Service: Cardiovascular;  Laterality: Bilateral;   CATARACT EXTRACTION, BILATERAL     w IOL   CHOLECYSTECTOMY  1998   FEMORAL-POPLITEAL BYPASS GRAFT        x2 surgeries 1990's & 2009   INJECTION KNEE Right Aug. 2016   Gel injection for pain   LUMBAR FUSION  07/06/2011   TONSILLECTOMY     as a teenager    TUBAL LIGATION      Current Outpatient Medications on File Prior to Visit  Medication Sig Dispense Refill   acetaminophen (TYLENOL) 500 MG tablet Take 1,000 mg by mouth 2 (two) times daily. Reported on 08/31/2015     aspirin EC 81 MG tablet Take 81 mg by mouth every evening.      atorvastatin (LIPITOR) 40 MG tablet Take 1 tablet (40 mg  total) by mouth daily. 90 tablet 3   baclofen (LIORESAL) 10 MG tablet TAKE 1/2 (ONE-HALF) TABLET BY MOUTH ONCE DAILY NIGHTLY 30 tablet 2   colchicine (COLCRYS) 0.6 MG tablet Take 2 pills at first sign gout flare, then another pill 2 hours later if still having pain. Then take once daily until pain resolves. (Patient taking differently: Take 0.6-1.2 mg by mouth See admin instructions. Take 1.2 mg at first sign gout flare, then 0.6 mg 2 hours later if still having pain. Then take 0.6 mg once daily until pain resolves.) 30 tablet 0   hydrochlorothiazide (HYDRODIURIL) 25 MG tablet Take 1 tablet (25 mg total) by mouth at bedtime. 90 tablet 3   irbesartan (AVAPRO) 300 MG tablet Take 1 tablet (300 mg total) by mouth daily. 90 tablet 3    lamoTRIgine (LAMICTAL) 100 MG tablet Take 1 tablet by mouth twice daily 60 tablet 5   Multiple Vitamin (MULTIVITAMIN WITH MINERALS) TABS tablet Take 1 tablet by mouth daily.     pantoprazole (PROTONIX) 40 MG tablet Take 1 tablet by mouth twice daily 60 tablet 0   warfarin (COUMADIN) 5 MG tablet TAKE 1 TABLET BY MOUTH ONCE DAILY, EXCEPT TAKE 1 & 1/2 TABLETS ON MONDAY OR TAKE AS DIRECTED BY ANTICOAGULATION CLINIC 120 tablet 0   ipratropium (ATROVENT) 0.06 % nasal spray USE 2 SPRAY(S) IN EACH NOSTRIL THREE TIMES DAILY (Patient not taking: Reported on 11/21/2019) 15 mL 3   levocetirizine (XYZAL) 5 MG tablet Take 5 mg by mouth every evening. (Patient not taking: Reported on 11/21/2019)     RESTASIS 0.05 % ophthalmic emulsion Place 1 drop into both eyes 2 (two) times daily.  (Patient not taking: Reported on 11/21/2019)  99   No current facility-administered medications on file prior to visit.   Physical exam:  Vitals:   11/21/19 1516  BP: (!) 112/64  Pulse: 84  Temp: (!) 97.3 F (36.3 C)  TempSrc: Skin  SpO2: 94%  Weight: 164 lb 9.6 oz (74.7 kg)  Height: 4\' 11"  (1.499 m)    Extremities: Feet are symmetrically pink, no ulcer, no pedal pulses bilaterally  Data: Patient had bilateral ABIs performed on November 18, 2019.  Right side was 0.34 left was 0.51 right side has declined from 0.55 previously.  Duplex ultrasound shows the right femoropopliteal is now occluded.  I reviewed and interpreted the studies.  Assessment: Occlusion right femoral to below-knee popliteal bypass now with rest pain right foot.  She has limb threatening ischemia.  All this was discussed with the patient and her daughter today.  Plan: Patient will be scheduled for aortogram lower extremity runoff on Friday November 29, 2018.  We will stop her warfarin prior to this.  Since she was on this for her bypass graft we probably will not resume it post procedure depending on the findings.  We will get her scheduled for cardiac or  stratification as soon as possible with a plan to redo her right leg bypass on December 09, 2018.  She wished to defer this till after 14 August due to a family funeral that was pending.  Risk benefits possible complications and procedure details were discussed with the patient today as far as arteriogram and bypass were concerned.  She still had some concerns with general anesthesia with her age.  We had a realistic discussion regarding the possibility that she could potentially die from the operation but that without an operation she would be at risk for limb loss.  We  also discussed the possibility of bleeding infection myocardial events respiratory events.  I also discussed with her that there is a high likelihood she would need to go to rehab postoperatively.  She and her daughter understand all of these risks as well as the benefits and they wish to proceed.  [name], MD [facility] Office: [phone]

## 2019-11-22 ENCOUNTER — Other Ambulatory Visit: Payer: Self-pay

## 2019-11-22 NOTE — Telephone Encounter (Signed)
Be worthwhile to have her be seen sooner with someone else as schedule allows

## 2019-11-25 ENCOUNTER — Telehealth: Payer: Self-pay | Admitting: General Practice

## 2019-11-25 ENCOUNTER — Other Ambulatory Visit: Payer: Self-pay | Admitting: Family Medicine

## 2019-11-25 ENCOUNTER — Encounter: Payer: Self-pay | Admitting: Cardiovascular Disease

## 2019-11-25 NOTE — Telephone Encounter (Signed)
Message sent to scheduler's to schedule pt for an appt with a different provider.

## 2019-11-25 NOTE — Telephone Encounter (Signed)
Attempted to return call to patient but no answer.  I did leave a VM and told patient I would try her again tomorrow, 8/3.

## 2019-11-25 NOTE — Telephone Encounter (Signed)
error 

## 2019-11-25 NOTE — Telephone Encounter (Signed)
Instructions for warfarin and Lovenox pre and post procedure.  8/11 - Last dose of warfarin until after procedure. 8/12 - Nothing (No warfarin and No Lovenox) 8/13 - Lovenox in the AM 8/14 - Lovenox in the AM 8/15 - Lovenox in the AM (Take by 7 am) 8/16 - Procedure (Do not take Lovenox today!) 8/17 - Lovenox in the AM and 1 1/2 tablets of warfarin 8/18 - Lovenox in the AM and 1 1/2 tablets of warfarin 8/19 - Lovenox in the AM and 1 1/2 tablets of warfarin 8/20 - Lovenox in the AM and 1 tablet of warfarin 8/21 - Stop Lovenox and continue warfarin, 1 tablet daily  8/24 - Check INR

## 2019-11-25 NOTE — Telephone Encounter (Signed)
-----   Message from Marin Olp, MD sent at 11/25/2019 12:55 PM EDT ----- Regarding: RE: Lovenox bridge Yes. Thank you so much for setting her up with bridge/dosing.   [name] ----- Message ----- From: Warden Fillers, RN Sent: 11/25/2019   8:48 AM EDT To: Marin Olp, MD Subject: Lovenox bridge                                 Hi Dr. Yong Channel,  Patient is having a R Femoral popliteal bypass graft on 8/16.  She of course will have to stop warfarin for 5 days prior to surgery.  She will also need a lovenox bridge before surgery.  OK to dose and order Lovenox for patient?  Please advise.  Thank you!  [name], RN

## 2019-11-26 ENCOUNTER — Telehealth: Payer: Self-pay | Admitting: *Deleted

## 2019-11-26 ENCOUNTER — Ambulatory Visit: Payer: Self-pay | Admitting: Cardiology

## 2019-11-26 NOTE — Telephone Encounter (Signed)
Spoke with patient regarding appointment for pre op cardiac clearance ---patient is scheduled Wednesday 11/27/19 at 8:40 am with Dr. Mardella Layman asked to arrive at 8:30 am for check in.  Patient voiced her understanding

## 2019-11-26 NOTE — Telephone Encounter (Signed)
I spoke with patient this morning about holding warfarin and a Lovenox bridge before her surgery on 8/16.  Patient said she had been given instructions by the surgeon to hold warfarin 3 days before the aortogram and 3 days before surgery on 8/16.  Patient will be seeing cardiology tomorrow, 8/4 and will inform this coumadin clinic if there is to be a change in the anticoagulation plan pre-surgery.

## 2019-11-27 ENCOUNTER — Ambulatory Visit (INDEPENDENT_AMBULATORY_CARE_PROVIDER_SITE_OTHER): Payer: Medicare Other | Admitting: Cardiovascular Disease

## 2019-11-27 ENCOUNTER — Telehealth: Payer: Self-pay

## 2019-11-27 ENCOUNTER — Ambulatory Visit (HOSPITAL_COMMUNITY): Payer: Medicare Other | Attending: Cardiovascular Disease

## 2019-11-27 ENCOUNTER — Other Ambulatory Visit: Payer: Self-pay

## 2019-11-27 ENCOUNTER — Ambulatory Visit (HOSPITAL_COMMUNITY): Payer: Medicare Other

## 2019-11-27 ENCOUNTER — Encounter (HOSPITAL_COMMUNITY): Payer: Self-pay

## 2019-11-27 ENCOUNTER — Encounter: Payer: Self-pay | Admitting: Cardiovascular Disease

## 2019-11-27 ENCOUNTER — Telehealth: Payer: Self-pay | Admitting: *Deleted

## 2019-11-27 VITALS — BP 125/77 | HR 111 | Temp 93.6°F | Ht 59.0 in | Wt 165.0 lb

## 2019-11-27 DIAGNOSIS — I4891 Unspecified atrial fibrillation: Secondary | ICD-10-CM | POA: Diagnosis not present

## 2019-11-27 DIAGNOSIS — I779 Disorder of arteries and arterioles, unspecified: Secondary | ICD-10-CM

## 2019-11-27 DIAGNOSIS — I480 Paroxysmal atrial fibrillation: Secondary | ICD-10-CM

## 2019-11-27 HISTORY — DX: Paroxysmal atrial fibrillation: I48.0

## 2019-11-27 LAB — COMPREHENSIVE METABOLIC PANEL
ALT: 13 IU/L (ref 0–32)
AST: 19 IU/L (ref 0–40)
Albumin/Globulin Ratio: 2.3 — ABNORMAL HIGH (ref 1.2–2.2)
Albumin: 4.5 g/dL (ref 3.5–4.6)
Alkaline Phosphatase: 88 IU/L (ref 48–121)
BUN/Creatinine Ratio: 21 (ref 12–28)
BUN: 26 mg/dL (ref 10–36)
Bilirubin Total: 0.4 mg/dL (ref 0.0–1.2)
CO2: 18 mmol/L — ABNORMAL LOW (ref 20–29)
Calcium: 9.7 mg/dL (ref 8.7–10.3)
Chloride: 103 mmol/L (ref 96–106)
Creatinine, Ser: 1.22 mg/dL — ABNORMAL HIGH (ref 0.57–1.00)
GFR calc Af Amer: 44 mL/min/{1.73_m2} — ABNORMAL LOW (ref >59)
GFR calc non Af Amer: 38 mL/min/{1.73_m2} — ABNORMAL LOW (ref >59)
Globulin, Total: 2 g/dL (ref 1.5–4.5)
Glucose: 110 mg/dL — ABNORMAL HIGH (ref 65–99)
Potassium: 4.6 mmol/L (ref 3.5–5.2)
Sodium: 141 mmol/L (ref 134–144)
Total Protein: 6.5 g/dL (ref 6.0–8.5)

## 2019-11-27 LAB — ECHOCARDIOGRAM COMPLETE
Height: 59 in
S' Lateral: 3 cm
Weight: 2640 oz

## 2019-11-27 LAB — T4, FREE: Free T4: 1.36 ng/dL (ref 0.82–1.77)

## 2019-11-27 LAB — MAGNESIUM: Magnesium: 1.7 mg/dL (ref 1.6–2.3)

## 2019-11-27 LAB — TSH: TSH: 3.19 u[IU]/mL (ref 0.450–4.500)

## 2019-11-27 MED ORDER — METOPROLOL SUCCINATE ER 25 MG PO TB24
25.0000 mg | ORAL_TABLET | Freq: Every day | ORAL | 1 refills | Status: DC
Start: 2019-11-27 — End: 2019-12-24

## 2019-11-27 NOTE — Telephone Encounter (Signed)
We can switch her to a trigger point procedure visit.  It will not interfere.  Thanks.

## 2019-11-27 NOTE — Progress Notes (Signed)
Cardiology Office Note   Date:  11/27/2019   ID:  AUNA MIKKELSEN, DOB May 07, 1924, MRN [id number]  PCP:  Marin Olp, MD  Cardiologist:   Skeet Latch, MD   No chief complaint on file.    History of Present Illness: [name] [name] is a 86 y.o. female with hypertension, hyperlipidemia, COPD, and PAD status post right femoral to above-knee popliteal bypass (2006) who is being seen today for the evaluation of presurgical risk assessment at the request of Marin Olp, MD.  Lately it has been very hard for her to walk.  She feels like she has constant leg cramps.  This has been getting gradually worse for the last 3-4 months.  She has not been as active since COVID-19.  She rides an exercise bike and has no exertional chest pain or shortness of breath.  She sometimes has lower extremity edema but this improves with elevation of her legs.  She has no orthopnea or PND.  She denies any palpitations, lightheadedness, or dizziness.  She notes that she has been somewhat more unsteady since she was started on lamotrigine for her trigeminal neuralgia.  She drinks 1 cup of decaffeinated coffee and does not drink alcohol.  She has a known stenosis of her peripheral bypass based on an arteriogram 05/2018 which revealed 80% narrowing of the mid right femoral-popliteal bypass graft.  She also had 70% stenosis of her left common femoral..  She previously deferred intervention because of concern about having an operation at her age.  However she has had increasing symptoms of claudication and last saw Dr. Oneida Alar on 7/29.  She previously underwent revision of her femoral to above-knee bypass in 2010.  This was extended to below the knee popliteal artery.  She is on chronic warfarin therapy since her thrombolysis procedure several years ago.  She is scheduled to undergo aortogram with lower extremity runoff on 11/29/2019.  She was referred to cardiology for presurgical risk assessment prior to redo of  her right leg bypass on 8/16.  [name] [name] had a stress echo 12/2011 that revealed normal systolic function and no ischemia.  Past Medical History:  Diagnosis Date   Allergic rhinoconjunctivitis    Allergy    Anxiety    pt. managed- uses deep breathing    Arthritis    low back , stenosis   COLONIC POLYPS, RECURRENT 08/29/2006   2008 last colonoscopy. No further colonoscopy.      COPD (chronic obstructive pulmonary disease) (HCC)    Diverticulosis    Eczema    Environmental allergies    allergy shot- q friday in Dr. Janee Morn office. PFT's abnormal- recommended Spiriva to use preop & will d/c after surgery   GERD (gastroesophageal reflux disease)    Hiatal hernia    Hyperlipidemia    Hypertension    Lung nodule 2011   Paroxysmal atrial fibrillation ([place]) 11/27/2019   Peripheral vascular disease (HCC)    Shingles    Stenosis of popliteal artery (HCC)    blood clots in legs long ago     Trigeminal neuralgia     Past Surgical History:  Procedure Laterality Date   ABDOMINAL AORTAGRAM N/A 06/17/2011   Procedure: ABDOMINAL Maxcine Ham;  Surgeon: Elam Dutch, MD;  Location: [facility] CATH LAB;  Service: Cardiovascular;  Laterality: N/A;   ABDOMINAL AORTOGRAM W/LOWER EXTREMITY Bilateral 06/22/2018   Procedure: ABDOMINAL AORTOGRAM W/LOWER EXTREMITY;  Surgeon: Elam Dutch, MD;  Location: [place] CV LAB;  Service: Cardiovascular;  Laterality: Bilateral;   CATARACT EXTRACTION, BILATERAL     w IOL   CHOLECYSTECTOMY  1998   FEMORAL-POPLITEAL BYPASS GRAFT        x2 surgeries 1990's & 2009   INJECTION KNEE Right Aug. 2016   Gel injection for pain   LUMBAR FUSION  07/06/2011   TONSILLECTOMY     as a teenager    TUBAL LIGATION       Current Outpatient Medications  Medication Sig Dispense Refill   acetaminophen (TYLENOL) 500 MG tablet Take 1,000 mg by mouth 2 (two) times daily.      aspirin EC 81 MG tablet Take 81 mg by mouth every evening.      atorvastatin  (LIPITOR) 40 MG tablet Take 1 tablet (40 mg total) by mouth daily. 90 tablet 3   baclofen (LIORESAL) 10 MG tablet TAKE 1/2 (ONE-HALF) TABLET BY MOUTH ONCE DAILY NIGHTLY (Patient taking differently: Take 5 mg by mouth at bedtime. ) 30 tablet 2   colchicine (COLCRYS) 0.6 MG tablet Take 2 pills at first sign gout flare, then another pill 2 hours later if still having pain. Then take once daily until pain resolves. (Patient taking differently: Take 0.6-1.2 mg by mouth See admin instructions. Take 1.2 mg at first sign gout flare, then 0.6 mg 2 hours later if still having pain. Then take 0.6 mg once daily until pain resolves.) 30 tablet 0   hydrochlorothiazide (HYDRODIURIL) 25 MG tablet Take 1 tablet (25 mg total) by mouth at bedtime. 90 tablet 3   ipratropium (ATROVENT) 0.06 % nasal spray USE 2 SPRAY(S) IN EACH NOSTRIL THREE TIMES DAILY (Patient taking differently: Place 2 sprays into both nostrils 3 (three) times daily as needed (allergies). ) 15 mL 3   irbesartan (AVAPRO) 300 MG tablet Take 1 tablet (300 mg total) by mouth daily. 90 tablet 3   lamoTRIgine (LAMICTAL) 100 MG tablet Take 1 tablet by mouth twice daily (Patient taking differently: Take 100 mg by mouth 2 (two) times daily. ) 60 tablet 5   levocetirizine (XYZAL) 5 MG tablet Take 5 mg by mouth daily as needed for allergies.      Menthol (ICY HOT) 5 % PTCH Apply 1 patch topically daily as needed (back pain).     Multiple Vitamin (MULTIVITAMIN WITH MINERALS) TABS tablet Take 1 tablet by mouth daily.     pantoprazole (PROTONIX) 40 MG tablet Take 1 tablet by mouth twice daily (Patient taking differently: Take 40 mg by mouth 2 (two) times daily. ) 60 tablet 0   RESTASIS 0.05 % ophthalmic emulsion Place 1 drop into both eyes 2 (two) times daily as needed (dry eyes).   99   warfarin (COUMADIN) 5 MG tablet TAKE 1 TABLET BY MOUTH ONCE DAILY, EXCEPT TAKE 1 & 1/2 TABLETS ON MONDAY OR TAKE AS DIRECTED BY ANTICOAGULATION CLINIC (Patient taking  differently: Take 5 mg by mouth every evening. ) 120 tablet 0   metoprolol succinate (TOPROL XL) 25 MG 24 hr tablet Take 1 tablet (25 mg total) by mouth daily. 90 tablet 1   No current facility-administered medications for this visit.    Allergies:   Latex, Sulfa antibiotics, Tiotropium bromide, and Ultram [tramadol]   Social History:  The patient  reports that she quit smoking about 26 years ago. Her smoking use included cigarettes. She has a 60.00 pack-year smoking history. She has never used smokeless tobacco. She reports that she does not drink alcohol and does not use drugs.   Family History:  The patient's family history includes Arthritis in her mother; Brain cancer in her sister; Breast cancer in her daughter and sister; Colon cancer in her sister; Colon polyps in her father; Heart disease in her father; Hypertension in her mother and son; Lung cancer in her brother and sister.    ROS:  Please see the history of present illness.   Otherwise, review of systems are positive for none.   All other systems are reviewed and negative.    PHYSICAL EXAM: VS:  BP 125/77    Pulse (!) 111    Temp (!) 93.6 F (34.2 C)    Ht 4\' 11"  (1.499 m)    Wt 165 lb (74.8 kg)    SpO2 97%    BMI 33.33 kg/m  , BMI Body mass index is 33.33 kg/m. GENERAL:  Well appearing HEENT:  Pupils equal round and reactive, fundi not visualized, oral mucosa unremarkable NECK:  No jugular venous distention, waveform within normal limits, carotid upstroke brisk and symmetric, no bruits, no thyromegaly LYMPHATICS:  No cervical adenopathy LUNGS:  Clear to auscultation bilaterally HEART:  Irregularly irregular.  Tachycardic.  PMI not displaced or sustained,S1 and S2 within normal limits, no S3, no S4, no clicks, no rubs, no murmurs/. Distant heart sounds ABD:  Flat, positive bowel sounds normal in frequency in pitch, no bruits, no rebound, no guarding, no midline pulsatile mass, no hepatomegaly, no splenomegaly EXT:  Unable to  palpate pedal or popliteal pulses bilaterally.  2+Femoral and radial pulses.  Trace edema, no cyanosis no clubbing SKIN:  No rashes no nodules NEURO:  Cranial nerves II through XII grossly intact, motor grossly intact throughout PSYCH:  Cognitively intact, oriented to person place and time  EKG:  EKG is ordered today. The ekg ordered today demonstrates atrial fibrillation.  Rate 111 bpm.   Recent Labs: 05/08/2019: BUN 21; Creat 1.17; Potassium 4.1; Sodium 142 06/10/2019: ALT 12; Hemoglobin 14.2; Platelets 184.0    Lipid Panel    Component Value Date/Time   CHOL 164 12/05/2018 1312   TRIG 173.0 (H) 12/05/2018 1312   TRIG 141 04/10/2006 0812   HDL 58.20 12/05/2018 1312   CHOLHDL 3 12/05/2018 1312   VLDL 34.6 12/05/2018 1312   LDLCALC 71 12/05/2018 1312   LDLDIRECT 46.0 06/10/2019 1132      Wt Readings from Last 3 Encounters:  11/27/19 165 lb (74.8 kg)  11/21/19 164 lb 9.6 oz (74.7 kg)  10/14/19 166 lb 6.4 oz (75.5 kg)      ASSESSMENT AND PLAN:  # Paroxysmal atrial fibrillation:  [name] [name] was noted to be in atrial fibrillation today.  This is a new finding.  She is completely asymptomatic.  We will focus on rate control.  We will add metoprolol succinate 25 mg daily.  I have asked her to check her blood pressure and heart rate and bring to follow-up.  We will check a TSH, CBC, magnesium, and CMP today and order an echocardiogram.  She already takes warfarin, so no changes are required.  # Pre-surgical risk assessment:  [name] [name] is able to achieve 4 metabolic equivalents.  Therefore no further ischemic evaluation is needed at this time.  She has no cardiac symptoms.  She has newly diagnosed atrial fibrillation and rates are high.  Starting metoprolol as above.  We will get her adequately rate controlled prior to surgery as above.  Her biggest risk factor for surgery is her age.  For 89 she is actually in pretty good physical shape  and was active until the last several weeks.   Her NSQIP risk of cardiac complications is 5.5% and death is 2.1%.  # PAD:  # Hyperlipidemia: [name] [name] is going for aortogram on Friday and re-do bypass later this month.  LDL is at goal.  Continue aspirin and atorvastatin.  She is on lifelong warfarin for prior thrombosis.   Current medicines are reviewed at length with the patient today.  The patient does not have concerns regarding medicines.  The following changes have been made:  Start metoprolol succinate 25mg  daily  Labs/ tests ordered today include:   Orders Placed This Encounter  Procedures   CBC with Differential/Platelet   T4, free   TSH   Magnesium   Comprehensive metabolic panel   EKG 73-UKGU   ECHOCARDIOGRAM COMPLETE     Disposition:   FU with Chesky Heyer C. Oval Linsey, MD, [facility] in 2-3 months.  Afib clinic or APP next week.    Signed, Hyder Deman C. Oval Linsey, MD, [facility]  11/27/2019 1:15 PM    [facility]

## 2019-11-27 NOTE — Telephone Encounter (Signed)
Advised patient to stop taking coumadin 3 days before her procedure and a bridge was not necessary.

## 2019-11-27 NOTE — Telephone Encounter (Signed)
[name] [name] has an appointment on tomorrow with you. She currently has a lot of back pain. Patient was hoping you could do an injection during the office visit. But wanted to know, if it would interfere with her pending Vascular Surgery on 12/09/2019?

## 2019-11-27 NOTE — Patient Instructions (Addendum)
Medication Instructions:  START METOPROLOL SUCC 25 MG DAILY   *If you need a refill on your cardiac medications before your next appointment, please call your pharmacy*  Lab Work: MAGNESIUM/CBC/TSH/FT4/CMET TODAY   If you have labs (blood work) drawn today and your tests are completely normal, you will receive your results only by:  [place] (if you have MyChart) OR  A paper copy in the mail If you have any lab test that is abnormal or we need to change your treatment, we will call you to review the results.  Testing/Procedures: Your physician has requested that you have an echocardiogram. Echocardiography is a painless test that uses sound waves to create images of your heart. It provides your doctor with information about the size and shape of your heart and how well your hearts chambers and valves are working. This procedure takes approximately one hour. There are no restrictions for this procedure. TODAY 1:05 PM AT [address]   Follow-Up: At [facility], you and your health needs are our priority.  As part of our continuing mission to provide you with exceptional heart care, we have created designated Provider Care Teams.  These Care Teams include your primary Cardiologist (physician) and Advanced Practice Providers (APPs -  Physician Assistants and Nurse Practitioners) who all work together to provide you with the care you need, when you need it.  We recommend signing up for the patient portal called "MyChart".  Sign up information is provided on this After Visit Summary.  MyChart is used to connect with patients for Virtual Visits (Telemedicine).  Patients are able to view lab/test results, encounter notes, upcoming appointments, etc.  Non-urgent messages can be sent to your provider as well.   To learn more about what you can do with MyChart, go to [email or web address].    Your next appointment:   2-3 month(s)  The format for your next  appointment:   In Person  Provider:   You may see DR [facility] or one of the following Advanced Practice Providers on your designated Care Team:    Kerin Ransom, PA-C  [place], [place]  [name], [place]  Your physician recommends that you schedule a follow-up appointment in: 1 WEEK WITH PA/NP/AFIB CLINIC   Other Instructions  GET A HOME BLOOD PRESSURE MACHINE (OMRON NOT WRIST CUFF) AND MONITOR YOUR BLOOD PRESSURE AND HEART RATE DAILY. BRING READINGS TO YOU FOLLOW UP

## 2019-11-28 ENCOUNTER — Other Ambulatory Visit (HOSPITAL_COMMUNITY)
Admission: RE | Admit: 2019-11-28 | Discharge: 2019-11-28 | Disposition: A | Discharge status: Home / Self Care | Payer: Medicare Other | Source: Ambulatory Visit | Attending: Vascular Surgery | Admitting: Vascular Surgery

## 2019-11-28 ENCOUNTER — Encounter: Payer: Self-pay | Admitting: Physical Medicine & Rehabilitation

## 2019-11-28 ENCOUNTER — Encounter: Payer: Medicare Other | Attending: Physical Medicine & Rehabilitation | Admitting: Physical Medicine & Rehabilitation

## 2019-11-28 VITALS — BP 135/85 | HR 66 | Temp 97.0°F | Ht 59.0 in | Wt 162.6 lb

## 2019-11-28 DIAGNOSIS — M549 Dorsalgia, unspecified: Secondary | ICD-10-CM | POA: Insufficient documentation

## 2019-11-28 DIAGNOSIS — Z20822 Contact with and (suspected) exposure to covid-19: Secondary | ICD-10-CM | POA: Diagnosis not present

## 2019-11-28 DIAGNOSIS — M791 Myalgia, unspecified site: Secondary | ICD-10-CM | POA: Diagnosis not present

## 2019-11-28 DIAGNOSIS — Z01812 Encounter for preprocedural laboratory examination: Secondary | ICD-10-CM | POA: Diagnosis not present

## 2019-11-28 LAB — SARS CORONAVIRUS 2 (TAT 6-24 HRS): SARS Coronavirus 2: NEGATIVE

## 2019-11-28 NOTE — Progress Notes (Signed)
Trigger point injection procedure note: Trigger Point Injection: Written consent was obtained for the patient. Trigger points were identified of bilateral thoracolumbosacral PSPs. The areas were cleaned with alcohol, vapocoolant spray applied, needle drawback performed, and each of  these trigger points were injected with 1cc of 1 cc of 0.5% (x5). There were no complications from the procedure, and it was well tolerated.

## 2019-11-29 ENCOUNTER — Encounter (HOSPITAL_COMMUNITY)
Admission: RE | Disposition: A | Discharge status: Home / Self Care | Payer: Self-pay | Source: Home / Self Care | Attending: Vascular Surgery

## 2019-11-29 ENCOUNTER — Observation Stay (HOSPITAL_COMMUNITY)
Admission: RE | Admit: 2019-11-29 | Discharge: 2019-11-30 | Disposition: A | Discharge status: Home / Self Care | Payer: Medicare Other | Attending: Vascular Surgery | Admitting: Vascular Surgery

## 2019-11-29 DIAGNOSIS — Z79899 Other long term (current) drug therapy: Secondary | ICD-10-CM | POA: Diagnosis not present

## 2019-11-29 DIAGNOSIS — N183 Chronic kidney disease, stage 3 unspecified: Secondary | ICD-10-CM | POA: Diagnosis not present

## 2019-11-29 DIAGNOSIS — J449 Chronic obstructive pulmonary disease, unspecified: Secondary | ICD-10-CM | POA: Insufficient documentation

## 2019-11-29 DIAGNOSIS — Z7982 Long term (current) use of aspirin: Secondary | ICD-10-CM | POA: Diagnosis not present

## 2019-11-29 DIAGNOSIS — I739 Peripheral vascular disease, unspecified: Principal | ICD-10-CM | POA: Diagnosis present

## 2019-11-29 DIAGNOSIS — Z7901 Long term (current) use of anticoagulants: Secondary | ICD-10-CM | POA: Diagnosis not present

## 2019-11-29 DIAGNOSIS — I70221 Atherosclerosis of native arteries of extremities with rest pain, right leg: Secondary | ICD-10-CM

## 2019-11-29 DIAGNOSIS — I129 Hypertensive chronic kidney disease with stage 1 through stage 4 chronic kidney disease, or unspecified chronic kidney disease: Secondary | ICD-10-CM | POA: Insufficient documentation

## 2019-11-29 DIAGNOSIS — M79671 Pain in right foot: Secondary | ICD-10-CM | POA: Diagnosis present

## 2019-11-29 DIAGNOSIS — Z86010 Personal history of colonic polyps: Secondary | ICD-10-CM | POA: Insufficient documentation

## 2019-11-29 HISTORY — PX: ABDOMINAL AORTOGRAM W/LOWER EXTREMITY: CATH118223

## 2019-11-29 LAB — POCT I-STAT, CHEM 8
BUN: 36 mg/dL — ABNORMAL HIGH (ref 8–23)
Calcium, Ion: 1.23 mmol/L (ref 1.15–1.40)
Chloride: 103 mmol/L (ref 98–111)
Creatinine, Ser: 1.2 mg/dL — ABNORMAL HIGH (ref 0.44–1.00)
Glucose, Bld: 109 mg/dL — ABNORMAL HIGH (ref 70–99)
HCT: 39 % (ref 36.0–46.0)
Hemoglobin: 13.3 g/dL (ref 12.0–15.0)
Potassium: 4.2 mmol/L (ref 3.5–5.1)
Sodium: 142 mmol/L (ref 135–145)
TCO2: 27 mmol/L (ref 22–32)

## 2019-11-29 LAB — PROTIME-INR
INR: 1.2 (ref 0.8–1.2)
Prothrombin Time: 14.9 seconds (ref 11.4–15.2)

## 2019-11-29 LAB — GLUCOSE, CAPILLARY
Glucose-Capillary: 110 mg/dL — ABNORMAL HIGH (ref 70–99)
Glucose-Capillary: 155 mg/dL — ABNORMAL HIGH (ref 70–99)

## 2019-11-29 SURGERY — ABDOMINAL AORTOGRAM W/LOWER EXTREMITY
Anesthesia: LOCAL | Laterality: Bilateral

## 2019-11-29 MED ORDER — ATORVASTATIN CALCIUM 40 MG PO TABS
40.0000 mg | ORAL_TABLET | Freq: Every day | ORAL | Status: DC
  Administered 2019-11-29: 40 mg via ORAL
  Filled 2019-11-29: qty 1

## 2019-11-29 MED ORDER — SODIUM CHLORIDE 0.9 % IV SOLN: INTRAVENOUS | Status: DC

## 2019-11-29 MED ORDER — ONDANSETRON HCL 4 MG/2ML IJ SOLN: 4.0000 mg | Freq: Four times a day (QID) | INTRAMUSCULAR | Status: DC | PRN

## 2019-11-29 MED ORDER — METOPROLOL SUCCINATE ER 25 MG PO TB24
25.0000 mg | ORAL_TABLET | Freq: Every day | ORAL | Status: DC
  Administered 2019-11-29 – 2019-11-30 (×2): 25 mg via ORAL
  Filled 2019-11-29 (×2): qty 1

## 2019-11-29 MED ORDER — IRBESARTAN 300 MG PO TABS
300.0000 mg | ORAL_TABLET | Freq: Every day | ORAL | Status: DC
  Administered 2019-11-30: 300 mg via ORAL
  Filled 2019-11-29: qty 2
  Filled 2019-11-29: qty 1

## 2019-11-29 MED ORDER — LORATADINE 10 MG PO TABS: 10.0000 mg | ORAL_TABLET | Freq: Every day | ORAL | Status: DC | PRN

## 2019-11-29 MED ORDER — MIDAZOLAM HCL 2 MG/2ML IJ SOLN
INTRAMUSCULAR | Status: DC | PRN
  Administered 2019-11-29: 1 mg via INTRAVENOUS

## 2019-11-29 MED ORDER — MIDAZOLAM HCL 2 MG/2ML IJ SOLN
INTRAMUSCULAR | Status: AC
  Filled 2019-11-29: qty 2

## 2019-11-29 MED ORDER — HYDRALAZINE HCL 20 MG/ML IJ SOLN: 5.0000 mg | INTRAMUSCULAR | Status: DC | PRN

## 2019-11-29 MED ORDER — MENTHOL (TOPICAL ANALGESIC) 5 % EX PTCH: 1.0000 | MEDICATED_PATCH | Freq: Every day | CUTANEOUS | Status: DC | PRN

## 2019-11-29 MED ORDER — ACETAMINOPHEN 325 MG PO TABS: 650.0000 mg | ORAL_TABLET | ORAL | Status: DC | PRN

## 2019-11-29 MED ORDER — IODIXANOL 320 MG/ML IV SOLN
INTRAVENOUS | Status: DC | PRN
  Administered 2019-11-29: 127 mL via INTRA_ARTERIAL

## 2019-11-29 MED ORDER — SODIUM CHLORIDE 0.9% FLUSH: 3.0000 mL | INTRAVENOUS | Status: DC | PRN

## 2019-11-29 MED ORDER — ASPIRIN EC 81 MG PO TBEC
81.0000 mg | DELAYED_RELEASE_TABLET | Freq: Every day | ORAL | Status: DC
  Administered 2019-11-30: 81 mg via ORAL
  Filled 2019-11-29: qty 1

## 2019-11-29 MED ORDER — HEPARIN (PORCINE) IN NACL 1000-0.9 UT/500ML-% IV SOLN
INTRAVENOUS | Status: DC | PRN
  Administered 2019-11-29 (×2): 500 mL

## 2019-11-29 MED ORDER — SODIUM CHLORIDE 0.9 % IV SOLN: 250.0000 mL | INTRAVENOUS | Status: DC | PRN

## 2019-11-29 MED ORDER — LEVOCETIRIZINE DIHYDROCHLORIDE 5 MG PO TABS: 5.0000 mg | ORAL_TABLET | Freq: Every day | ORAL | Status: DC | PRN

## 2019-11-29 MED ORDER — MORPHINE SULFATE (PF) 2 MG/ML IV SOLN: 2.0000 mg | INTRAVENOUS | Status: DC | PRN

## 2019-11-29 MED ORDER — LABETALOL HCL 5 MG/ML IV SOLN: 10.0000 mg | INTRAVENOUS | Status: DC | PRN

## 2019-11-29 MED ORDER — ACETAMINOPHEN 500 MG PO TABS
1000.0000 mg | ORAL_TABLET | Freq: Two times a day (BID) | ORAL | Status: DC
  Administered 2019-11-29 – 2019-11-30 (×2): 1000 mg via ORAL
  Filled 2019-11-29 (×2): qty 2

## 2019-11-29 MED ORDER — HEPARIN (PORCINE) IN NACL 1000-0.9 UT/500ML-% IV SOLN
INTRAVENOUS | Status: AC
  Filled 2019-11-29: qty 1000

## 2019-11-29 MED ORDER — ASPIRIN EC 81 MG PO TBEC: 81.0000 mg | DELAYED_RELEASE_TABLET | Freq: Every evening | ORAL | Status: DC

## 2019-11-29 MED ORDER — ADULT MULTIVITAMIN W/MINERALS CH
1.0000 | ORAL_TABLET | Freq: Every day | ORAL | Status: DC
  Administered 2019-11-30: 1 via ORAL
  Filled 2019-11-29: qty 1

## 2019-11-29 MED ORDER — LAMOTRIGINE 100 MG PO TABS
100.0000 mg | ORAL_TABLET | Freq: Two times a day (BID) | ORAL | Status: DC
  Administered 2019-11-29 – 2019-11-30 (×2): 100 mg via ORAL
  Filled 2019-11-29 (×2): qty 1

## 2019-11-29 MED ORDER — LIDOCAINE HCL (PF) 1 % IJ SOLN
INTRAMUSCULAR | Status: DC | PRN
  Administered 2019-11-29: 15 mL via INTRADERMAL

## 2019-11-29 MED ORDER — HYDROCHLOROTHIAZIDE 25 MG PO TABS: 25.0000 mg | ORAL_TABLET | Freq: Every day | ORAL | Status: DC

## 2019-11-29 MED ORDER — SODIUM CHLORIDE 0.9 % IV SOLN: INTRAVENOUS | Status: AC

## 2019-11-29 MED ORDER — MUSCLE RUB 10-15 % EX CREA: TOPICAL_CREAM | CUTANEOUS | Status: DC | PRN

## 2019-11-29 MED ORDER — SODIUM CHLORIDE 0.9% FLUSH: 3.0000 mL | Freq: Two times a day (BID) | INTRAVENOUS | Status: DC

## 2019-11-29 MED ORDER — LIDOCAINE HCL (PF) 1 % IJ SOLN
INTRAMUSCULAR | Status: AC
  Filled 2019-11-29: qty 30

## 2019-11-29 MED ORDER — OXYCODONE HCL 5 MG PO TABS: 5.0000 mg | ORAL_TABLET | ORAL | Status: DC | PRN

## 2019-11-29 SURGICAL SUPPLY — 10 items
CATH ANGIO 5F PIGTAIL 65CM (CATHETERS) ×1 IMPLANT
KIT PV (KITS) ×2 IMPLANT
SHEATH PINNACLE 5F 10CM (SHEATH) ×1 IMPLANT
SHEATH PINNACLE 6F 10CM (SHEATH) ×2 IMPLANT
SHEATH PROBE COVER 6X72 (BAG) ×2 IMPLANT
SYR MEDRAD MARK V 150ML (SYRINGE) ×2 IMPLANT
TRANSDUCER W/STOPCOCK (MISCELLANEOUS) ×2 IMPLANT
TRAY PV CATH (CUSTOM PROCEDURE TRAY) ×2 IMPLANT
WIRE HITORQ VERSACORE ST 145CM (WIRE) ×2 IMPLANT
WIRE ROSEN-J .035X180CM (WIRE) ×1 IMPLANT

## 2019-11-29 NOTE — Interval H&P Note (Signed)
History and Physical Interval Note:  11/29/2019 1:48 PM  [name] [name]  has presented today for surgery, with the diagnosis of PAD.  The various methods of treatment have been discussed with the patient and family. After consideration of risks, benefits and other options for treatment, the patient has consented to  Procedure(s): ABDOMINAL AORTOGRAM W/LOWER EXTREMITY (Bilateral) as a surgical intervention.  The patient's history has been reviewed, patient examined, no change in status, stable for surgery.  I have reviewed the patient's chart and labs.  Questions were answered to the patient's satisfaction.     [name]

## 2019-11-29 NOTE — Addendum Note (Signed)
Addended by: Nicholas Lose on: 11/29/2019 05:01 PM   Modules accepted: Orders

## 2019-11-29 NOTE — Interval H&P Note (Signed)
History and Physical Interval Note:  11/29/2019 1:52 PM  [name] [name]  has presented today for surgery, with the diagnosis of PAD.  The various methods of treatment have been discussed with the patient and family. After consideration of risks, benefits and other options for treatment, the patient has consented to  Procedure(s): ABDOMINAL AORTOGRAM W/LOWER EXTREMITY (Bilateral) as a surgical intervention.  The patient's history has been reviewed, patient examined, no change in status, stable for surgery.  I have reviewed the patient's chart and labs.  Questions were answered to the patient's satisfaction.     [name]

## 2019-11-29 NOTE — Op Note (Signed)
Procedure: Abdominal aortogram with bilateral lower extremity runoff  Preoperative diagnosis: Rest pain right foot  Postoperative diagnosis: Same  Anesthesia: Local with sedation  Operative findings: 1.  90% stenosis left common femoral artery above femoral below-knee popliteal bypass two-vessel runoff left foot anterior tibial dominant  2.  Occlusion right femoral to below-knee popliteal bypass and native right superficial femoral and popliteal arteries.  3.  Patent right below-knee popliteal artery with one-vessel peroneal runoff  Operative details: After team informed consent, the patient taken the [place] lab.  The patient was placed in supine position angio table.  Both groins were prepped and draped in usual sterile fashion.  Ultrasound was used to examine the patient's left groin.  I could not find a reasonable window that appeared to have a good lumen within it and identify the left femoral-popliteal bypass.  Therefore I decided to stick the right groin.  Ultrasound was used to identify the right common femoral artery and femoral bifurcation.  Using ultrasound guidance after local anesthetic infiltration I was able to successfully cannulate the right common femoral artery and advance an 035 Rosen wire into the abdominal aorta.  A 6 French dilator was then placed over this to dilate up the tract through pre-existing scar.  5 French sheath was then placed over the guidewire and thoroughly flushed with heparinized saline.  5 French pigtail catheter was then advanced into the abdominal aorta and abdominal aortogram obtained in AP projection.  Left and right renal arteries are patent.  Infrarenal abdominal aorta is patent.  The left and right common external and internal iliac arteries are all patent.  Next pigtail catheter was pulled down above the aortic bifurcation and bilateral oblique views of the pelvis were obtained with magnification.  This confirmed the above findings.  Additionally there is a  90% stenosis in the mid left common femoral artery above the level of her bypass.  On the right side the right common femoral is patent.  The right profundus patent.  The native right superficial femoral artery occludes at its origin.  Next bilateral lower extremity runoff views were obtained through the pigtail catheter.  In the right lower extremity, the right common femoral and profunda is patent.  The right femoropopliteal bypass is occluded throughout its course.  The native right superficial femoral artery above-knee popliteal and most of the below-knee popliteal artery is occluded.  The distal below-knee popliteal artery does reconstitute and gives off one-vessel runoff to the peroneal.  In the left lower extremity, the left common femoral artery is patent but there is a stenosis in the midportion about 90% which is just above the level of an existing femoral to below-knee popliteal bypass.  The profunda is patent.  The femoral to below-knee popliteal bypass is patent with two-vessel runoff of the left foot dominant AT runoff.  The native left superficial femoral and popliteal arteries are occluded.  At this point the 5 French pigtail catheter was removed over guidewire.  The 5 French sheath was thoroughly flushed with heparinized saline.  The sheath was left in place to be pulled in the holding area.  Operative management: The patient will be scheduled on December 09, 2019 for redo right femoral to below-knee popliteal bypass with PTFE.  Additionally we will do a left common femoral endarterectomy to salvage the left femoropopliteal bypass at the same operation.  Patient was updated on the plan and I discussed this with her daughter by phone.  The patient will be admitted tonight for  23-hour observation since she does not have anyone at home with her [place], MD [facility] Office: [phone]

## 2019-11-30 DIAGNOSIS — I70221 Atherosclerosis of native arteries of extremities with rest pain, right leg: Secondary | ICD-10-CM | POA: Diagnosis not present

## 2019-11-30 DIAGNOSIS — Z7901 Long term (current) use of anticoagulants: Secondary | ICD-10-CM | POA: Diagnosis not present

## 2019-11-30 DIAGNOSIS — I739 Peripheral vascular disease, unspecified: Secondary | ICD-10-CM | POA: Diagnosis not present

## 2019-11-30 DIAGNOSIS — J449 Chronic obstructive pulmonary disease, unspecified: Secondary | ICD-10-CM | POA: Diagnosis not present

## 2019-11-30 DIAGNOSIS — Z7982 Long term (current) use of aspirin: Secondary | ICD-10-CM | POA: Diagnosis not present

## 2019-11-30 DIAGNOSIS — I129 Hypertensive chronic kidney disease with stage 1 through stage 4 chronic kidney disease, or unspecified chronic kidney disease: Secondary | ICD-10-CM | POA: Diagnosis not present

## 2019-11-30 DIAGNOSIS — N183 Chronic kidney disease, stage 3 unspecified: Secondary | ICD-10-CM | POA: Diagnosis not present

## 2019-11-30 LAB — CBC
HCT: 37 % (ref 36.0–46.0)
Hemoglobin: 12.1 g/dL (ref 12.0–15.0)
MCH: 29.2 pg (ref 26.0–34.0)
MCHC: 32.7 g/dL (ref 30.0–36.0)
MCV: 89.2 fL (ref 80.0–100.0)
Platelets: 160 10*3/uL (ref 150–400)
RBC: 4.15 MIL/uL (ref 3.87–5.11)
RDW: 14 % (ref 11.5–15.5)
WBC: 7.5 10*3/uL (ref 4.0–10.5)
nRBC: 0 % (ref 0.0–0.2)

## 2019-11-30 LAB — BASIC METABOLIC PANEL
Anion gap: 10 (ref 5–15)
BUN: 24 mg/dL — ABNORMAL HIGH (ref 8–23)
CO2: 25 mmol/L (ref 22–32)
Calcium: 8.9 mg/dL (ref 8.9–10.3)
Chloride: 105 mmol/L (ref 98–111)
Creatinine, Ser: 1.08 mg/dL — ABNORMAL HIGH (ref 0.44–1.00)
GFR calc Af Amer: 51 mL/min — ABNORMAL LOW (ref >60)
GFR calc non Af Amer: 44 mL/min — ABNORMAL LOW (ref >60)
Glucose, Bld: 109 mg/dL — ABNORMAL HIGH (ref 70–99)
Potassium: 3.8 mmol/L (ref 3.5–5.1)
Sodium: 140 mmol/L (ref 135–145)

## 2019-11-30 LAB — GLUCOSE, CAPILLARY
Glucose-Capillary: 117 mg/dL — ABNORMAL HIGH (ref 70–99)
Glucose-Capillary: 102 mg/dL — ABNORMAL HIGH (ref 70–99)

## 2019-11-30 NOTE — Discharge Summary (Signed)
Discharge Summary    [name] [name] 1925-01-23 86 y.o. female  [id number]  Admission Date: 11/29/2019  Discharge Date: 11/30/2019  Physician: Elam Dutch, MD  Admission Diagnosis: PAD (peripheral artery disease) [facility]) [I73.9]   HPI:   This is a 86 y.o. female who returns for follow-up today.  She was last seen in March 2021.  At that time she had a known narrowing of her bypass but deferred an intervention on it because she was worried about having an operation at her age.  About 4 to 6 weeks ago she began to have very short distance claudication followed by rest pain in her right foot.  This has not improved.  She is having difficulty walking now because the foot does not feel normal.  She previously underwent a right femoral to above-knee popliteal bypass in 2006 by [name].  This was revised and extended to the below-knee popliteal artery by me in 2010.  She has had 1 prior thrombolysis procedure in the right leg.  She also has had a previous left femoral to above-knee popliteal bypass by [name] in 2006.  She had an arteriogram in February 2020 which showed an 80% narrowing of the midportion of her right femoropopliteal bypass graft.  She also had a 70% stenosis of her left common femoral artery.  She was having mild claudication symptoms at that time.  She was reluctant to proceed with any intervention due to her age.  She is currently on warfarin which she was started on after her thrombolysis procedure several years ago.  She is also on aspirin and a statin.  Hospital Course:  The patient was admitted to the hospital and taken to the [facility] lab on 11/29/2019 and underwent: Abdominal aortogram with bilateral lower extremity runoff    Operative findings:  1.  90% stenosis left common femoral artery above femoral below-knee popliteal bypass two-vessel runoff left foot anterior tibial dominant  2.  Occlusion right femoral to below-knee popliteal bypass and native right  superficial femoral and popliteal arteries.  3.  Patent right below-knee popliteal artery with one-vessel peroneal runoff  Operative management: The patient will be scheduled on December 09, 2019 for redo right femoral to below-knee popliteal bypass with PTFE.  Additionally we will do a left common femoral endarterectomy to salvage the left femoropopliteal bypass at the same operation.  Patient was updated on the plan and I discussed this with her daughter by phone.  The patient will be admitted tonight for 23-hour observation since she does not have anyone at home with her tonight  The pt tolerated the procedure well and was transported to the PACU in good condition.   The pt was kept overnight for observation.  On POD 1, pt was doing well and discharged home.  She will return at a later day for surgery.    The remainder of the hospital course consisted of increasing mobilization and increasing intake of solids without difficulty.  CBC    Component Value Date/Time   WBC 7.5 11/30/2019 0435   RBC 4.15 11/30/2019 0435   HGB 12.1 11/30/2019 0435   HCT 37.0 11/30/2019 0435   PLT 160 11/30/2019 0435   MCV 89.2 11/30/2019 0435   MCH 29.2 11/30/2019 0435   MCHC 32.7 11/30/2019 0435   RDW 14.0 11/30/2019 0435   LYMPHSABS 1.9 06/10/2019 1132   MONOABS 0.6 06/10/2019 1132   EOSABS 0.1 06/10/2019 1132   BASOSABS 0.1 06/10/2019 1132    BMET  Component Value Date/Time   NA 140 11/30/2019 0435   NA 141 11/27/2019 1026   K 3.8 11/30/2019 0435   CL 105 11/30/2019 0435   CO2 25 11/30/2019 0435   GLUCOSE 109 (H) 11/30/2019 0435   BUN 24 (H) 11/30/2019 0435   BUN 26 11/27/2019 1026   CREATININE 1.08 (H) 11/30/2019 0435   CREATININE 1.17 (H) 05/08/2019 1402   CALCIUM 8.9 11/30/2019 0435   GFRNONAA 44 (L) 11/30/2019 0435   GFRAA 51 (L) 11/30/2019 0435      Discharge Instructions    Discharge patient   Complete by: As directed    Please instruct pt to restart coumadin today  and stop taking 4 days prior to surgery.  Thanks   Discharge disposition: 01-Home or Self Care   Discharge patient date: 11/30/2019      Discharge Diagnosis:  PAD (peripheral artery disease) ([place]) [I73.9]  Secondary Diagnosis: Patient Active Problem List   Diagnosis Date Noted  . PAD (peripheral artery disease) ([place]) 11/29/2019  . Paroxysmal atrial fibrillation ([place]) 11/27/2019  . Chronic bilateral low back pain with right-sided sciatica 08/29/2019  . Chronic pain of right knee 08/29/2019  . Abnormality of gait 05/30/2019  . Neuropathic pain 05/30/2019  . Myalgia 08/08/2018  . Chronic pain syndrome 06/27/2018  . Gross hematuria 06/01/2018  . Obesity (BMI 30.0-34.9) 11/14/2017  . Failed back syndrome 09/28/2017  . CKD (chronic kidney disease), stage III 08/07/2017  . Long term (current) use of anticoagulants 02/03/2017  . Unilateral primary osteoarthritis, right knee 04/07/2016  . Acute gout of left foot 03/22/2016  . Pain in right ankle and joints of right foot 08/28/2014  . Trigeminal neuralgia   . Encounter for therapeutic drug monitoring 10/21/2013  . Syncope 12/29/2011  . Chronic low back pain 09/13/2010  . Allergic rhinitis due to pollen 06/25/2010  . COPD mixed type ([place]) 01/22/2010  . Solitary pulmonary nodule 01/12/2010  . Insulin resistance 01/06/2009  . Osteopenia 01/06/2009  . Eczema 11/26/2007  . Peripheral arterial disease ([place]) 06/29/2007  . Hyperlipidemia 10/23/2006  . Essential hypertension 10/23/2006  . ESOPHAGEAL STRICTURE 08/09/1993   Past Medical History:  Diagnosis Date  . Allergic rhinoconjunctivitis   . Allergy   . Anxiety    pt. managed- uses deep breathing   . Arthritis    low back , stenosis  . COLONIC POLYPS, RECURRENT 08/29/2006   2008 last colonoscopy. No further colonoscopy.     Marland Kitchen COPD (chronic obstructive pulmonary disease) ([place])   . Diverticulosis   . Eczema   . Environmental allergies    allergy shot- q friday in Dr. Janee Morn  office. PFT's abnormal- recommended Spiriva to use preop & will d/c after surgery  . GERD (gastroesophageal reflux disease)   . Hiatal hernia   . Hyperlipidemia   . Hypertension   . Lung nodule 2011  . Paroxysmal atrial fibrillation ([place]) 11/27/2019  . Peripheral vascular disease ([place])   . Shingles   . Stenosis of popliteal artery ([facility])    blood clots in legs long ago    . Trigeminal neuralgia      Allergies as of 11/30/2019      Reactions   Latex Itching, Rash   Sulfa Antibiotics Other (See Comments)   Cold sweat light headed and disorientation   Tiotropium Bromide Shortness Of Breath, Other (See Comments)   Sore throat also   Ultram [tramadol] Nausea And Vomiting   5-alpha Reductase Inhibitors       Medication List  TAKE these medications   acetaminophen 500 MG tablet Commonly known as: TYLENOL Take 1,000 mg by mouth 2 (two) times daily.   aspirin EC 81 MG tablet Take 81 mg by mouth every evening.   atorvastatin 40 MG tablet Commonly known as: LIPITOR Take 1 tablet (40 mg total) by mouth daily.   baclofen 10 MG tablet Commonly known as: LIORESAL TAKE 1/2 (ONE-HALF) TABLET BY MOUTH ONCE DAILY NIGHTLY What changed: See the new instructions.   colchicine 0.6 MG tablet Commonly known as: Colcrys Take 2 pills at first sign gout flare, then another pill 2 hours later if still having pain. Then take once daily until pain resolves. What changed:   how much to take  how to take this  when to take this  additional instructions   hydrochlorothiazide 25 MG tablet Commonly known as: HYDRODIURIL TAKE 1 TABLET BY MOUTH AT BEDTIME   Icy Hot 5 % Ptch Generic drug: Menthol Apply 1 patch topically daily as needed (back pain).   ipratropium 0.06 % nasal spray Commonly known as: ATROVENT USE 2 SPRAY(S) IN EACH NOSTRIL THREE TIMES DAILY What changed: See the new instructions.   irbesartan 300 MG tablet Commonly known as: AVAPRO Take 1 tablet by mouth once daily     lamoTRIgine 100 MG tablet Commonly known as: LAMICTAL Take 1 tablet by mouth twice daily   levocetirizine 5 MG tablet Commonly known as: XYZAL Take 5 mg by mouth daily as needed for allergies.   metoprolol succinate 25 MG 24 hr tablet Commonly known as: Toprol XL Take 1 tablet (25 mg total) by mouth daily.   multivitamin with minerals Tabs tablet Take 1 tablet by mouth daily.   pantoprazole 40 MG tablet Commonly known as: PROTONIX Take 1 tablet by mouth twice daily   Restasis 0.05 % ophthalmic emulsion Generic drug: cycloSPORINE Place 1 drop into both eyes 2 (two) times daily as needed (dry eyes).   warfarin 5 MG tablet Commonly known as: COUMADIN Take as directed. If you are unsure how to take this medication, talk to your nurse or doctor. Original instructions: TAKE 1 TABLET BY MOUTH ONCE DAILY, EXCEPT TAKE 1 & 1/2 TABLETS ON MONDAY OR TAKE AS DIRECTED BY ANTICOAGULATION CLINIC What changed:   how much to take  how to take this  when to take this  additional instructions         Instructions:  [facility]  Discharge Instructions  Lower Extremity Angiogram; Angioplasty/Stenting  Please refer to the following instructions for your post-procedure care. Your surgeon or physician assistant will discuss any changes with you.  Activity  Avoid lifting more than 8 pounds (1 gallons of milk) for 72 hours (3 days) after your procedure. You may walk as much as you can tolerate. It's OK to drive after 72 hours.  Bathing/Showering  You may shower the day after your procedure. If you have a bandage, you may remove it at 24- 48 hours. Clean your incision site with mild soap and water. Pat the area dry with a clean towel.  Diet  Resume your pre-procedure diet. There are no special food restrictions following this procedure. All patients with peripheral vascular disease should follow a low fat/low cholesterol diet. In order to heal from  your surgery, it is CRITICAL to get adequate nutrition. Your body requires vitamins, minerals, and protein. Vegetables are the best source of vitamins and minerals. Vegetables also provide the perfect balance of protein. Processed food has little  nutritional value, so try to avoid this.  Medications  Resume taking all of your medications unless your doctor tells you not to. If your incision is causing pain, you may take over-the-counter pain relievers such as acetaminophen (Tylenol)  RESTART COUMADIN TODAY AND DISCONTINUE 4 DAYS PRIOR TO SURGERY.  Follow Up  Follow up will be arranged at the time of your procedure. You may have an office visit scheduled or may be scheduled for surgery. Ask your surgeon if you have any questions.  Please call us immediately for any of the following conditions: .Severe or worsening pain your legs or feet at rest or with walking. .Increased pain, redness, drainage at your groin puncture site. .Fever of 101 degrees or higher. .If you have any mild or slow bleeding from your puncture site: lie down, apply firm constant pressure over the area with a piece of gauze or a clean wash cloth for 30 minutes- no peeking!, call 911 right away if you are still bleeding after 30 minutes, or if the bleeding is heavy and unmanageable.  Reduce your risk factors of vascular disease:  . Stop smoking. If you would like help call [facility] at 1-800-QUIT-NOW (617) 709-0948) or [place] at 959-431-9798. . Manage your cholesterol . Maintain a desired weight . Control your diabetes . Keep your blood pressure down .  If you have any questions, please call the office at [phone]  Prescriptions given: none given  Disposition: home  Patient's condition: is Good  Follow up: 1. Will have surgery at later date with Dr. Cecile Hearing, PA-C [facility] [phone] 11/30/2019  9:39 AM

## 2019-11-30 NOTE — Plan of Care (Signed)
  Problem: Education: °Goal: Understanding of CV disease, CV risk reduction, and recovery process will improve °Outcome: Progressing °  °Problem: Activity: °Goal: Ability to return to baseline activity level will improve °Outcome: Progressing °  °Problem: Cardiovascular: °Goal: Vascular access site(s) Level 0-1 will be maintained °Outcome: Progressing °  °

## 2019-11-30 NOTE — Discharge Instructions (Signed)
   [facility]  Discharge Instructions  Lower Extremity Angiogram; Angioplasty/Stenting  Please refer to the following instructions for your post-procedure care. Your surgeon or physician assistant will discuss any changes with you.  Activity  Avoid lifting more than 8 pounds (1 gallons of milk) for 72 hours (3 days) after your procedure. You may walk as much as you can tolerate. It's OK to drive after 72 hours.  Bathing/Showering  You may shower the day after your procedure. If you have a bandage, you may remove it at 24- 48 hours. Clean your incision site with mild soap and water. Pat the area dry with a clean towel.  Diet  Resume your pre-procedure diet. There are no special food restrictions following this procedure. All patients with peripheral vascular disease should follow a low fat/low cholesterol diet. In order to heal from your surgery, it is CRITICAL to get adequate nutrition. Your body requires vitamins, minerals, and protein. Vegetables are the best source of vitamins and minerals. Vegetables also provide the perfect balance of protein. Processed food has little nutritional value, so try to avoid this.  Medications  Resume taking all of your medications unless your doctor tells you not to. If your incision is causing pain, you may take over-the-counter pain relievers such as acetaminophen (Tylenol)  Follow Up  Follow up will be arranged at the time of your procedure. You may have an office visit scheduled or may be scheduled for surgery. Ask your surgeon if you have any questions.  RESTART COUMADIN TODAY AND DISCONTINUE 4 DAYS PRIOR TO SURGERY.  Please call us immediately for any of the following conditions: .Severe or worsening pain your legs or feet at rest or with walking. .Increased pain, redness, drainage at your groin puncture site. .Fever of 101 degrees or higher. .If you have any mild or slow bleeding from your puncture site: lie  down, apply firm constant pressure over the area with a piece of gauze or a clean wash cloth for 30 minutes- no peeking!, call 911 right away if you are still bleeding after 30 minutes, or if the bleeding is heavy and unmanageable.  Reduce your risk factors of vascular disease:  . Stop smoking. If you would like help call [facility] at 1-800-QUIT-NOW (905)707-3634) or [place] at 213-374-5636. . Manage your cholesterol . Maintain a desired weight . Control your diabetes . Keep your blood pressure down .  If you have any questions, please call the office at [phone]

## 2019-11-30 NOTE — Care Management (Addendum)
DME rolling walker ordered from [employer] for delivery to room prior to d/c.   Addendum- patient request clarified. She would like a rollator- RW with seat.  Order change with [employer] at 1150.

## 2019-11-30 NOTE — Progress Notes (Signed)
    Subjective  - POD #1, status post lower extremity arteriogram  No complaints this morning   Physical Exam:  Groin cannulation site is without complication Abdomen and flank are nontender. Respirations nonlabored       Assessment/Plan:  POD #1  Patient is cleared for discharge today.  She will return for bypass graft next week.  We will restart her Coumadin today and discontinue it 4 days prior to her operation.  Wells Bereket Gernert 11/30/2019 10:09 AM --  Vitals:   11/30/19 0317 11/30/19 0842  BP: (!) 111/58 131/70  Pulse: 81 87  Resp: 18   Temp: 98.4 F (36.9 C)   SpO2:      Intake/Output Summary (Last 24 hours) at 11/30/2019 1009 Last data filed at 11/30/2019 0024 Gross per 24 hour  Intake 720 ml  Output --  Net 720 ml     Laboratory CBC    Component Value Date/Time   WBC 7.5 11/30/2019 0435   HGB 12.1 11/30/2019 0435   HCT 37.0 11/30/2019 0435   PLT 160 11/30/2019 0435    BMET    Component Value Date/Time   NA 140 11/30/2019 0435   NA 141 11/27/2019 1026   K 3.8 11/30/2019 0435   CL 105 11/30/2019 0435   CO2 25 11/30/2019 0435   GLUCOSE 109 (H) 11/30/2019 0435   BUN 24 (H) 11/30/2019 0435   BUN 26 11/27/2019 1026   CREATININE 1.08 (H) 11/30/2019 0435   CREATININE 1.17 (H) 05/08/2019 1402   CALCIUM 8.9 11/30/2019 0435   GFRNONAA 44 (L) 11/30/2019 0435   GFRAA 51 (L) 11/30/2019 0435    COAG Lab Results  Component Value Date   INR 1.2 11/29/2019   INR 1.8 (A) 11/21/2019   INR 2.7 10/10/2019   No results found for: PTT  Antibiotics Anti-infectives (From admission, onward)   None       V. Leia Alf, M.D., [facility] [place] Office: [phone] Pager:  [phone]

## 2019-12-01 ENCOUNTER — Other Ambulatory Visit: Payer: Self-pay | Admitting: Allergy and Immunology

## 2019-12-02 ENCOUNTER — Telehealth: Payer: Self-pay

## 2019-12-02 ENCOUNTER — Telehealth: Payer: Self-pay | Admitting: Allergy and Immunology

## 2019-12-02 ENCOUNTER — Other Ambulatory Visit: Payer: Self-pay

## 2019-12-02 ENCOUNTER — Encounter (HOSPITAL_COMMUNITY): Payer: Self-pay | Admitting: Vascular Surgery

## 2019-12-02 MED ORDER — PANTOPRAZOLE SODIUM 40 MG PO TBEC: 40.0000 mg | DELAYED_RELEASE_TABLET | Freq: Two times a day (BID) | ORAL | 0 refills | Status: DC

## 2019-12-02 NOTE — Telephone Encounter (Signed)
Patient states the pharmacy called and told her her refill on pantoprazole was denied and she is concerned. Patient is having surgery soon and needs acid reflux medication.   Please advise.

## 2019-12-02 NOTE — Telephone Encounter (Signed)
Pt called to clarify instructions for holding her warfarin before surgery. Per staff message from Dr. Oneida Alar on 11/29/19, patient was recently diagnosed with atrial fibrillation. Her overall risk from this is fairly low so I believe we should probably just leave her off of the warfarin until the 16th and we will decide in hospital when to restart it. Informed pt of this information and she verbalized understanding and repeated back.

## 2019-12-02 NOTE — Telephone Encounter (Signed)
Called and spoke to patient and a refill has been given. She has been advised to keep her appointment or she will not be able to get more if she cancels the appointment. Patient verbalized understanding.

## 2019-12-03 ENCOUNTER — Encounter (HOSPITAL_COMMUNITY): Payer: Self-pay | Admitting: Physician Assistant

## 2019-12-03 ENCOUNTER — Ambulatory Visit (HOSPITAL_COMMUNITY)
Admission: RE | Admit: 2019-12-03 | Discharge: 2019-12-03 | Disposition: A | Discharge status: Home / Self Care | Payer: Medicare Other | Source: Ambulatory Visit | Attending: Physician Assistant | Admitting: Physician Assistant

## 2019-12-03 ENCOUNTER — Other Ambulatory Visit: Payer: Self-pay

## 2019-12-03 VITALS — BP 126/68 | HR 104 | Ht 59.0 in | Wt 164.2 lb

## 2019-12-03 DIAGNOSIS — F419 Anxiety disorder, unspecified: Secondary | ICD-10-CM | POA: Insufficient documentation

## 2019-12-03 DIAGNOSIS — I484 Atypical atrial flutter: Secondary | ICD-10-CM | POA: Insufficient documentation

## 2019-12-03 DIAGNOSIS — Z87891 Personal history of nicotine dependence: Secondary | ICD-10-CM | POA: Diagnosis not present

## 2019-12-03 DIAGNOSIS — Z79899 Other long term (current) drug therapy: Secondary | ICD-10-CM | POA: Diagnosis not present

## 2019-12-03 DIAGNOSIS — Z9049 Acquired absence of other specified parts of digestive tract: Secondary | ICD-10-CM | POA: Insufficient documentation

## 2019-12-03 DIAGNOSIS — K219 Gastro-esophageal reflux disease without esophagitis: Secondary | ICD-10-CM | POA: Insufficient documentation

## 2019-12-03 DIAGNOSIS — Z95820 Peripheral vascular angioplasty status with implants and grafts: Secondary | ICD-10-CM | POA: Insufficient documentation

## 2019-12-03 DIAGNOSIS — E785 Hyperlipidemia, unspecified: Secondary | ICD-10-CM | POA: Diagnosis not present

## 2019-12-03 DIAGNOSIS — Z7982 Long term (current) use of aspirin: Secondary | ICD-10-CM | POA: Diagnosis not present

## 2019-12-03 DIAGNOSIS — R911 Solitary pulmonary nodule: Secondary | ICD-10-CM | POA: Diagnosis not present

## 2019-12-03 DIAGNOSIS — I10 Essential (primary) hypertension: Secondary | ICD-10-CM | POA: Insufficient documentation

## 2019-12-03 DIAGNOSIS — J449 Chronic obstructive pulmonary disease, unspecified: Secondary | ICD-10-CM | POA: Diagnosis not present

## 2019-12-03 DIAGNOSIS — Z888 Allergy status to other drugs, medicaments and biological substances status: Secondary | ICD-10-CM | POA: Insufficient documentation

## 2019-12-03 DIAGNOSIS — Z885 Allergy status to narcotic agent status: Secondary | ICD-10-CM | POA: Insufficient documentation

## 2019-12-03 DIAGNOSIS — E669 Obesity, unspecified: Secondary | ICD-10-CM | POA: Diagnosis not present

## 2019-12-03 DIAGNOSIS — G500 Trigeminal neuralgia: Secondary | ICD-10-CM | POA: Insufficient documentation

## 2019-12-03 DIAGNOSIS — K5790 Diverticulosis of intestine, part unspecified, without perforation or abscess without bleeding: Secondary | ICD-10-CM | POA: Diagnosis not present

## 2019-12-03 DIAGNOSIS — I739 Peripheral vascular disease, unspecified: Secondary | ICD-10-CM | POA: Diagnosis not present

## 2019-12-03 DIAGNOSIS — M48061 Spinal stenosis, lumbar region without neurogenic claudication: Secondary | ICD-10-CM | POA: Diagnosis not present

## 2019-12-03 DIAGNOSIS — Z6833 Body mass index (BMI) 33.0-33.9, adult: Secondary | ICD-10-CM | POA: Diagnosis not present

## 2019-12-03 DIAGNOSIS — D6869 Other thrombophilia: Secondary | ICD-10-CM | POA: Diagnosis not present

## 2019-12-03 DIAGNOSIS — I4819 Other persistent atrial fibrillation: Secondary | ICD-10-CM | POA: Diagnosis not present

## 2019-12-03 DIAGNOSIS — Z86010 Personal history of colonic polyps: Secondary | ICD-10-CM | POA: Insufficient documentation

## 2019-12-03 DIAGNOSIS — Z881 Allergy status to other antibiotic agents status: Secondary | ICD-10-CM | POA: Diagnosis not present

## 2019-12-03 DIAGNOSIS — Z8249 Family history of ischemic heart disease and other diseases of the circulatory system: Secondary | ICD-10-CM | POA: Insufficient documentation

## 2019-12-03 NOTE — Progress Notes (Signed)
Primary Care Physician: Marin Olp, MD Primary Cardiologist: Dr Oval Linsey Primary Electrophysiologist: none Referring Physician: Dr Diego Cory is a 86 y.o. female with a history of PAD, HLD, HTN, COPD, persistent atrial fibrillation who presents for consultation in the [facility]. The patient was initially diagnosed with atrial fibrillation 11/27/19 incidentally at a visit with Dr Oval Linsey for presurgical risk assessment. Patient was asymptomatic and she was started on Toprol for rate control. She was already on warfarin prior and has a CHADS2VASC score of 5. She is scheduled to undergo redo of her right leg bypass on 12/09/19 with Dr Oneida Alar. Patient continues to deny any symptoms from her arrhythmia. Home log of heart rates shows 70s-100s. She is currently off warfarin in anticipation of surgery.  Today, she denies symptoms of palpitations, chest pain, shortness of breath, orthopnea, PND, lower extremity edema, dizziness, presyncope, syncope, snoring, daytime somnolence, bleeding, or neurologic sequela. The patient is tolerating medications without difficulties and is otherwise without complaint today.    Atrial Fibrillation Risk Factors:  she does not have symptoms or diagnosis of sleep apnea. she does not have a history of rheumatic fever.   she has a BMI of Body mass index is 33.16 kg/m.Marland Kitchen Filed Weights   12/03/19 1437  Weight: 74.5 kg    Family History  Problem Relation Age of Onset  . Arthritis Mother   . Hypertension Mother   . Heart disease Father   . Colon polyps Father   . Breast cancer Sister   . Breast cancer Daughter   . Hypertension Son   . Lung cancer Brother   . Colon cancer Sister   . Brain cancer Sister   . Lung cancer Sister   . Anesthesia problems Neg Hx   . Hypotension Neg Hx   . Malignant hyperthermia Neg Hx   . Pseudochol deficiency Neg Hx      Atrial Fibrillation Management history:  Previous  antiarrhythmic drugs: none Previous cardioversions: none Previous ablations: none CHADS2VASC score: 5 Anticoagulation history: warfarin   Past Medical History:  Diagnosis Date  . Allergic rhinoconjunctivitis   . Allergy   . Anxiety    pt. managed- uses deep breathing   . Arthritis    low back , stenosis  . COLONIC POLYPS, RECURRENT 08/29/2006   2008 last colonoscopy. No further colonoscopy.     Marland Kitchen COPD (chronic obstructive pulmonary disease) ([place])   . Diverticulosis   . Eczema   . Environmental allergies    allergy shot- q friday in Dr. Janee Morn office. PFT's abnormal- recommended Spiriva to use preop & will d/c after surgery  . GERD (gastroesophageal reflux disease)   . Hiatal hernia   . Hyperlipidemia   . Hypertension   . Lung nodule 2011  . Paroxysmal atrial fibrillation ([place]) 11/27/2019  . Peripheral vascular disease ([place])   . Shingles   . Stenosis of popliteal artery ([facility])    blood clots in legs long ago    . Trigeminal neuralgia    Past Surgical History:  Procedure Laterality Date  . ABDOMINAL AORTAGRAM N/A 06/17/2011   Procedure: ABDOMINAL Maxcine Ham;  Surgeon: Elam Dutch, MD;  Location: [facility] CATH LAB;  Service: Cardiovascular;  Laterality: N/A;  . ABDOMINAL AORTOGRAM W/LOWER EXTREMITY Bilateral 06/22/2018   Procedure: ABDOMINAL AORTOGRAM W/LOWER EXTREMITY;  Surgeon: Elam Dutch, MD;  Location: [facility];  Service: Cardiovascular;  Laterality: Bilateral;  . ABDOMINAL AORTOGRAM W/LOWER EXTREMITY Bilateral 11/29/2019  Procedure: ABDOMINAL AORTOGRAM W/LOWER EXTREMITY;  Surgeon: Elam Dutch, MD;  Location: [facility];  Service: Cardiovascular;  Laterality: Bilateral;  . CATARACT EXTRACTION, BILATERAL     w IOL  . CHOLECYSTECTOMY  1998  . FEMORAL-POPLITEAL BYPASS GRAFT        x2 surgeries 1990's & 2009  . INJECTION KNEE Right Aug. 2016   Gel injection for pain  . LUMBAR FUSION  07/06/2011  . TONSILLECTOMY     as a teenager   . TUBAL LIGATION       Current Outpatient Medications  Medication Sig Dispense Refill  . acetaminophen (TYLENOL) 500 MG tablet Take 1,000 mg by mouth 2 (two) times daily.     Marland Kitchen aspirin EC 81 MG tablet Take 81 mg by mouth every evening.     Marland Kitchen atorvastatin (LIPITOR) 40 MG tablet Take 1 tablet (40 mg total) by mouth daily. 90 tablet 3  . baclofen (LIORESAL) 10 MG tablet TAKE 1/2 (ONE-HALF) TABLET BY MOUTH ONCE DAILY NIGHTLY 30 tablet 2  . colchicine (COLCRYS) 0.6 MG tablet Take 2 pills at first sign gout flare, then another pill 2 hours later if still having pain. Then take once daily until pain resolves. 30 tablet 0  . hydrochlorothiazide (HYDRODIURIL) 25 MG tablet TAKE 1 TABLET BY MOUTH AT BEDTIME 90 tablet 0  . irbesartan (AVAPRO) 300 MG tablet Take 1 tablet by mouth once daily 90 tablet 0  . lamoTRIgine (LAMICTAL) 100 MG tablet Take 1 tablet by mouth twice daily 60 tablet 5  . Menthol (ICY HOT) 5 % PTCH Apply 1 patch topically daily as needed (back pain).    . metoprolol succinate (TOPROL XL) 25 MG 24 hr tablet Take 1 tablet (25 mg total) by mouth daily. 90 tablet 1  . Multiple Vitamin (MULTIVITAMIN WITH MINERALS) TABS tablet Take 1 tablet by mouth daily.    . pantoprazole (PROTONIX) 40 MG tablet Take 1 tablet (40 mg total) by mouth 2 (two) times daily. 60 tablet 0  . RESTASIS 0.05 % ophthalmic emulsion Place 1 drop into both eyes 2 (two) times daily as needed (dry eyes).   99  . warfarin (COUMADIN) 5 MG tablet TAKE 1 TABLET BY MOUTH ONCE DAILY, EXCEPT TAKE 1 & 1/2 TABLETS ON MONDAY OR TAKE AS DIRECTED BY ANTICOAGULATION CLINIC (Patient taking differently: Take 5 mg by mouth every evening. ) 120 tablet 0   No current facility-administered medications for this encounter.    Allergies  Allergen Reactions  . Latex Itching and Rash  . Sulfa Antibiotics Other (See Comments)    Cold sweat light headed and disorientation  . Tiotropium Bromide Shortness Of Breath and Other (See Comments)    Sore throat also  .  Ultram [Tramadol] Nausea And Vomiting  . 5-Alpha Reductase Inhibitors     Social History   Socioeconomic History  . Marital status: Single    Spouse name: Not on file  . Number of children: 4  . Years of education: 46  . Highest education level: Not on file  Occupational History  . Occupation: retired    Fish farm manager: RETIRED  Tobacco Use  . Smoking status: Former Smoker    Packs/day: 2.00    Years: 30.00    Pack years: 60.00    Types: Cigarettes    Quit date: 06/18/1993    Years since quitting: 26.4  . Smokeless tobacco: Never Used  . Tobacco comment: QUIT IN 1995  Vaping Use  . Vaping Use: Never used  Substance and Sexual Activity  . Alcohol use: No    Alcohol/week: 0.0 standard drinks  . Drug use: No  . Sexual activity: Not on file  Other Topics Concern  . Not on file  Social History Narrative   Patient is widowed with 4 children. 2 grandkids. Lives alone.    Patient is right handed.   Patient has high school education.   Patient drinks 1 cup daily.      Retired from [employer] for 28 years, works 2 days a week until 2016.       Hobbies: volunteers at [employer], [place] from church visits people.       No caffeine.    Social Determinants of Health   Financial Resource Strain:   . Difficulty of Paying Living Expenses:   Food Insecurity:   . Worried About Charity fundraiser in the Last Year:   . Arboriculturist in the Last Year:   Transportation Needs:   . Film/video editor (Medical):   Marland Kitchen Lack of Transportation (Non-Medical):   Physical Activity:   . Days of Exercise per Week:   . Minutes of Exercise per Session:   Stress:   . Feeling of Stress :   Social Connections:   . Frequency of Communication with Friends and Family:   . Frequency of Social Gatherings with Friends and Family:   . Attends Religious Services:   . Active Member of Clubs or Organizations:   . Attends Archivist Meetings:   Marland Kitchen Marital Status:   Intimate  Partner Violence:   . Fear of Current or Ex-Partner:   . Emotionally Abused:   Marland Kitchen Physically Abused:   . Sexually Abused:      ROS- All systems are reviewed and negative except as per the HPI above.  Physical Exam: Vitals:   12/03/19 1437  BP: 126/68  Pulse: (!) 104  Weight: 74.5 kg  Height: 4\' 11"  (1.499 m)    GEN- The patient is well appearing obese elderly female, alert and oriented x 3 today.   Head- normocephalic, atraumatic Eyes-  Sclera clear, conjunctiva pink Ears- hearing intact Oropharynx- clear Neck- supple  Lungs- Clear to ausculation bilaterally, normal work of breathing Heart- irregular rate and rhythm, no murmurs, rubs or gallops  GI- soft, NT, ND, + BS Extremities- no clubbing, cyanosis. Non pitting edema bilaterally.  MS- no significant deformity or atrophy Skin- no rash or lesion Psych- euthymic mood, full affect Neuro- strength and sensation are intact  Wt Readings from Last 3 Encounters:  12/03/19 74.5 kg  11/30/19 73.8 kg  11/28/19 73.8 kg    EKG today demonstrates atypical atrial flutter vs coarse afib HR 104, QRS 76, QTc 439  Echo 11/27/19 demonstrated  1. Left ventricular ejection fraction, by estimation, is 60 to 65%. The  left ventricle has normal function. The left ventricle has no regional  wall motion abnormalities. The left ventricular internal cavity size was  mildly dilated. Left ventricular  diastolic function could not be evaluated.  2. Right ventricular systolic function is normal. The right ventricular  size is normal. There is mildly elevated pulmonary artery systolic  pressure.  3. Left atrial size was mildly dilated.  4. The mitral valve is normal in structure. No evidence of mitral valve  regurgitation. No evidence of mitral stenosis.  5. The aortic valve is grossly normal. Aortic valve regurgitation is not  visualized. No aortic stenosis is present.   Epic records are  reviewed at length today  CHA2DS2-VASc Score = 5   The patient's score is based upon: CHF History: 0 HTN History: 1 Age : 2 Diabetes History: 0 Stroke History: 0 Vascular Disease History: 1 Gender: 1      ASSESSMENT AND PLAN: 1. Persistent Atrial Fibrillation (ICD10:  I48.0) The patient's CHA2DS2-VASc score is 5, indicating a 7.2% annual risk of stroke.  Given age and paucity of symptoms, pursuing rate control.  HR on exam in office 80s-low 90s at rest and ~100 with activity.  Continue Toprol 25 mg daily Resume warfarin after surgery, no intervention planned for afib.  2. Secondary Hypercoagulable State (ICD10:  D68.69) The patient is at significant risk for stroke/thromboembolism based upon her CHA2DS2-VASc Score of 5.  Continue Warfarin (Coumadin).   3. Obesity Body mass index is 33.16 kg/m. Lifestyle modification was discussed at length including regular exercise and weight reduction.  4. HTN Stable, no changes today.  5. PAD Scheduled for redo of right femoral popliteal bypass 12/09/19 with Dr Oneida Alar.    Follow up with [name] as scheduled. AF clinic as needed.    [facility] [address] [place], [place] [address] [phone] 12/03/2019 3:21 PM

## 2019-12-06 ENCOUNTER — Other Ambulatory Visit (HOSPITAL_COMMUNITY)
Admission: RE | Admit: 2019-12-06 | Discharge: 2019-12-06 | Disposition: A | Discharge status: Home / Self Care | Payer: Medicare Other | Source: Ambulatory Visit | Attending: Vascular Surgery | Admitting: Vascular Surgery

## 2019-12-06 ENCOUNTER — Encounter (HOSPITAL_COMMUNITY): Payer: Self-pay | Admitting: Vascular Surgery

## 2019-12-06 ENCOUNTER — Other Ambulatory Visit: Payer: Self-pay

## 2019-12-06 DIAGNOSIS — Z20822 Contact with and (suspected) exposure to covid-19: Secondary | ICD-10-CM | POA: Insufficient documentation

## 2019-12-06 DIAGNOSIS — Z01812 Encounter for preprocedural laboratory examination: Secondary | ICD-10-CM | POA: Insufficient documentation

## 2019-12-06 LAB — SARS CORONAVIRUS 2 (TAT 6-24 HRS): SARS Coronavirus 2: NEGATIVE

## 2019-12-06 NOTE — Progress Notes (Addendum)
[name] [name] denies chest pain or shortness of breath. Patient was tested for Covid today and has been in quarantine since that time.  [name] [name] plans to go to [facility] for rehab at discharge, she has been a patient there before.  Patient spoke with admitting there and they will have a private room for her.  [name] [name] reports that she was instructed by Dr. Oneida Alar on 11/29/2019 to stop Coumadin and to continue Aspirin.

## 2019-12-08 NOTE — Anesthesia Preprocedure Evaluation (Addendum)
Anesthesia Evaluation  Patient identified by MRN, date of birth, ID band Patient awake    Reviewed: Allergy & Precautions, NPO status , Patient's Chart, lab work & pertinent test results, reviewed documented beta blocker date and time   Airway Mallampati: II  TM Distance: >3 FB Neck ROM: Full    Dental  (+) Dental Advisory Given, Upper Dentures, Lower Dentures   Pulmonary COPD, former smoker,    Pulmonary exam normal breath sounds clear to auscultation       Cardiovascular hypertension, Pt. on home beta blockers and Pt. on medications + Peripheral Vascular Disease  Normal cardiovascular exam+ dysrhythmias Atrial Fibrillation  Rhythm:Regular Rate:Normal  Echo 11/27/2019: 1. Left ventricular ejection fraction, by estimation, is 60 to 65%. The left ventricle has normal function. The left ventricle has no regional wall motion abnormalities. The left ventricular internal cavity size was mildly dilated. Left ventricular diastolic function could not be evaluated.  2. Right ventricular systolic function is normal. The right ventricular size is normal. There is mildly elevated pulmonary artery systolic pressure.  3. Left atrial size was mildly dilated.  4. The mitral valve is normal in structure. No evidence of mitral valve regurgitation. No evidence of mitral stenosis.  5. The aortic valve is grossly normal. Aortic valve regurgitation is not visualized. No aortic stenosis is present.    Neuro/Psych PSYCHIATRIC DISORDERS Anxiety  Neuromuscular disease    GI/Hepatic Neg liver ROS, hiatal hernia, GERD  Medicated,  Endo/Other  Obesity   Renal/GU Renal InsufficiencyRenal disease     Musculoskeletal  (+) Arthritis ,   Abdominal   Peds  Hematology  (+) Blood dyscrasia (Warfarin), ,   Anesthesia Other Findings   Reproductive/Obstetrics                            Anesthesia Physical Anesthesia Plan  ASA:  III  Anesthesia Plan: General   Post-op Pain Management:    Induction: Intravenous  PONV Risk Score and Plan: 3 and Dexamethasone, Ondansetron and Treatment may vary due to age or medical condition  Airway Management Planned: Oral ETT  Additional Equipment: Arterial line  Intra-op Plan:   Post-operative Plan: Extubation in OR  Informed Consent: I have reviewed the patients History and Physical, chart, labs and discussed the procedure including the risks, benefits and alternatives for the proposed anesthesia with the patient or authorized representative who has indicated his/her understanding and acceptance.     Dental advisory given  Plan Discussed with: CRNA  Anesthesia Plan Comments: (2nd PIV, possible arterial line)       Anesthesia Quick Evaluation

## 2019-12-09 ENCOUNTER — Other Ambulatory Visit: Payer: Self-pay

## 2019-12-09 ENCOUNTER — Inpatient Hospital Stay (HOSPITAL_COMMUNITY): Payer: Medicare Other | Admitting: Anesthesiology

## 2019-12-09 ENCOUNTER — Encounter (HOSPITAL_COMMUNITY): Payer: Self-pay | Admitting: Vascular Surgery

## 2019-12-09 ENCOUNTER — Ambulatory Visit: Payer: Medicare Other | Admitting: Family Medicine

## 2019-12-09 ENCOUNTER — Encounter (HOSPITAL_COMMUNITY)
Admission: RE | Disposition: A | Discharge status: Home / Self Care | Payer: Self-pay | Source: Home / Self Care | Attending: Vascular Surgery

## 2019-12-09 ENCOUNTER — Inpatient Hospital Stay (HOSPITAL_COMMUNITY)
Admission: RE | Admit: 2019-12-09 | Discharge: 2019-12-13 | DRG: 252 | Disposition: A | Discharge status: Home / Self Care | Payer: Medicare Other | Attending: Vascular Surgery | Admitting: Vascular Surgery

## 2019-12-09 DIAGNOSIS — K5730 Diverticulosis of large intestine without perforation or abscess without bleeding: Secondary | ICD-10-CM | POA: Diagnosis present

## 2019-12-09 DIAGNOSIS — I129 Hypertensive chronic kidney disease with stage 1 through stage 4 chronic kidney disease, or unspecified chronic kidney disease: Secondary | ICD-10-CM | POA: Diagnosis present

## 2019-12-09 DIAGNOSIS — Z808 Family history of malignant neoplasm of other organs or systems: Secondary | ICD-10-CM

## 2019-12-09 DIAGNOSIS — Z888 Allergy status to other drugs, medicaments and biological substances status: Secondary | ICD-10-CM | POA: Diagnosis not present

## 2019-12-09 DIAGNOSIS — R5381 Other malaise: Secondary | ICD-10-CM | POA: Diagnosis not present

## 2019-12-09 DIAGNOSIS — I4819 Other persistent atrial fibrillation: Secondary | ICD-10-CM | POA: Diagnosis not present

## 2019-12-09 DIAGNOSIS — I739 Peripheral vascular disease, unspecified: Secondary | ICD-10-CM | POA: Diagnosis not present

## 2019-12-09 DIAGNOSIS — Z9049 Acquired absence of other specified parts of digestive tract: Secondary | ICD-10-CM

## 2019-12-09 DIAGNOSIS — I959 Hypotension, unspecified: Secondary | ICD-10-CM | POA: Diagnosis not present

## 2019-12-09 DIAGNOSIS — G500 Trigeminal neuralgia: Secondary | ICD-10-CM | POA: Diagnosis present

## 2019-12-09 DIAGNOSIS — Z95828 Presence of other vascular implants and grafts: Secondary | ICD-10-CM | POA: Diagnosis not present

## 2019-12-09 DIAGNOSIS — K529 Noninfective gastroenteritis and colitis, unspecified: Secondary | ICD-10-CM | POA: Diagnosis not present

## 2019-12-09 DIAGNOSIS — N390 Urinary tract infection, site not specified: Secondary | ICD-10-CM | POA: Diagnosis not present

## 2019-12-09 DIAGNOSIS — Z8371 Family history of colonic polyps: Secondary | ICD-10-CM

## 2019-12-09 DIAGNOSIS — J449 Chronic obstructive pulmonary disease, unspecified: Secondary | ICD-10-CM | POA: Diagnosis not present

## 2019-12-09 DIAGNOSIS — F411 Generalized anxiety disorder: Secondary | ICD-10-CM | POA: Diagnosis not present

## 2019-12-09 DIAGNOSIS — Z87891 Personal history of nicotine dependence: Secondary | ICD-10-CM

## 2019-12-09 DIAGNOSIS — Z882 Allergy status to sulfonamides status: Secondary | ICD-10-CM | POA: Diagnosis not present

## 2019-12-09 DIAGNOSIS — I4891 Unspecified atrial fibrillation: Secondary | ICD-10-CM | POA: Diagnosis not present

## 2019-12-09 DIAGNOSIS — D62 Acute posthemorrhagic anemia: Secondary | ICD-10-CM

## 2019-12-09 DIAGNOSIS — K559 Vascular disorder of intestine, unspecified: Secondary | ICD-10-CM

## 2019-12-09 DIAGNOSIS — K922 Gastrointestinal hemorrhage, unspecified: Secondary | ICD-10-CM | POA: Diagnosis not present

## 2019-12-09 DIAGNOSIS — K219 Gastro-esophageal reflux disease without esophagitis: Secondary | ICD-10-CM | POA: Diagnosis present

## 2019-12-09 DIAGNOSIS — N183 Chronic kidney disease, stage 3 unspecified: Secondary | ICD-10-CM | POA: Diagnosis present

## 2019-12-09 DIAGNOSIS — R1084 Generalized abdominal pain: Secondary | ICD-10-CM | POA: Diagnosis not present

## 2019-12-09 DIAGNOSIS — Z9889 Other specified postprocedural states: Secondary | ICD-10-CM | POA: Diagnosis not present

## 2019-12-09 DIAGNOSIS — Z803 Family history of malignant neoplasm of breast: Secondary | ICD-10-CM | POA: Diagnosis not present

## 2019-12-09 DIAGNOSIS — Z79899 Other long term (current) drug therapy: Secondary | ICD-10-CM

## 2019-12-09 DIAGNOSIS — R933 Abnormal findings on diagnostic imaging of other parts of digestive tract: Secondary | ICD-10-CM | POA: Diagnosis not present

## 2019-12-09 DIAGNOSIS — K625 Hemorrhage of anus and rectum: Secondary | ICD-10-CM

## 2019-12-09 DIAGNOSIS — I70221 Atherosclerosis of native arteries of extremities with rest pain, right leg: Secondary | ICD-10-CM | POA: Diagnosis not present

## 2019-12-09 DIAGNOSIS — I779 Disorder of arteries and arterioles, unspecified: Secondary | ICD-10-CM | POA: Diagnosis present

## 2019-12-09 DIAGNOSIS — D72829 Elevated white blood cell count, unspecified: Secondary | ICD-10-CM | POA: Diagnosis not present

## 2019-12-09 DIAGNOSIS — R195 Other fecal abnormalities: Secondary | ICD-10-CM | POA: Diagnosis not present

## 2019-12-09 DIAGNOSIS — N179 Acute kidney failure, unspecified: Secondary | ICD-10-CM | POA: Diagnosis not present

## 2019-12-09 DIAGNOSIS — I480 Paroxysmal atrial fibrillation: Secondary | ICD-10-CM | POA: Diagnosis not present

## 2019-12-09 DIAGNOSIS — I771 Stricture of artery: Secondary | ICD-10-CM | POA: Diagnosis not present

## 2019-12-09 DIAGNOSIS — Z7982 Long term (current) use of aspirin: Secondary | ICD-10-CM

## 2019-12-09 DIAGNOSIS — Z981 Arthrodesis status: Secondary | ICD-10-CM | POA: Diagnosis not present

## 2019-12-09 DIAGNOSIS — M961 Postlaminectomy syndrome, not elsewhere classified: Secondary | ICD-10-CM | POA: Diagnosis not present

## 2019-12-09 DIAGNOSIS — D696 Thrombocytopenia, unspecified: Secondary | ICD-10-CM | POA: Diagnosis not present

## 2019-12-09 DIAGNOSIS — N281 Cyst of kidney, acquired: Secondary | ICD-10-CM | POA: Diagnosis not present

## 2019-12-09 DIAGNOSIS — K55031 Focal (segmental) acute (reversible) ischemia of large intestine: Secondary | ICD-10-CM | POA: Diagnosis not present

## 2019-12-09 DIAGNOSIS — Z8261 Family history of arthritis: Secondary | ICD-10-CM

## 2019-12-09 DIAGNOSIS — R251 Tremor, unspecified: Secondary | ICD-10-CM | POA: Diagnosis not present

## 2019-12-09 DIAGNOSIS — Z7901 Long term (current) use of anticoagulants: Secondary | ICD-10-CM

## 2019-12-09 DIAGNOSIS — I10 Essential (primary) hypertension: Secondary | ICD-10-CM | POA: Diagnosis present

## 2019-12-09 DIAGNOSIS — Z8249 Family history of ischemic heart disease and other diseases of the circulatory system: Secondary | ICD-10-CM

## 2019-12-09 DIAGNOSIS — Z801 Family history of malignant neoplasm of trachea, bronchus and lung: Secondary | ICD-10-CM

## 2019-12-09 DIAGNOSIS — E871 Hypo-osmolality and hyponatremia: Secondary | ICD-10-CM | POA: Diagnosis not present

## 2019-12-09 DIAGNOSIS — J309 Allergic rhinitis, unspecified: Secondary | ICD-10-CM | POA: Diagnosis present

## 2019-12-09 DIAGNOSIS — K5900 Constipation, unspecified: Secondary | ICD-10-CM | POA: Diagnosis not present

## 2019-12-09 DIAGNOSIS — E669 Obesity, unspecified: Secondary | ICD-10-CM | POA: Diagnosis present

## 2019-12-09 DIAGNOSIS — I358 Other nonrheumatic aortic valve disorders: Secondary | ICD-10-CM | POA: Diagnosis not present

## 2019-12-09 DIAGNOSIS — Z800 Family history of malignant neoplasm of digestive organs: Secondary | ICD-10-CM | POA: Diagnosis not present

## 2019-12-09 DIAGNOSIS — Z91040 Latex allergy status: Secondary | ICD-10-CM

## 2019-12-09 DIAGNOSIS — I7090 Unspecified atherosclerosis: Secondary | ICD-10-CM | POA: Diagnosis not present

## 2019-12-09 DIAGNOSIS — R9439 Abnormal result of other cardiovascular function study: Secondary | ICD-10-CM | POA: Diagnosis not present

## 2019-12-09 DIAGNOSIS — E785 Hyperlipidemia, unspecified: Secondary | ICD-10-CM

## 2019-12-09 DIAGNOSIS — Z95820 Peripheral vascular angioplasty status with implants and grafts: Secondary | ICD-10-CM

## 2019-12-09 DIAGNOSIS — R2689 Other abnormalities of gait and mobility: Secondary | ICD-10-CM | POA: Diagnosis not present

## 2019-12-09 DIAGNOSIS — I70223 Atherosclerosis of native arteries of extremities with rest pain, bilateral legs: Principal | ICD-10-CM | POA: Diagnosis present

## 2019-12-09 DIAGNOSIS — F419 Anxiety disorder, unspecified: Secondary | ICD-10-CM | POA: Diagnosis present

## 2019-12-09 DIAGNOSIS — Z20822 Contact with and (suspected) exposure to covid-19: Secondary | ICD-10-CM | POA: Diagnosis not present

## 2019-12-09 DIAGNOSIS — Z86718 Personal history of other venous thrombosis and embolism: Secondary | ICD-10-CM

## 2019-12-09 DIAGNOSIS — D500 Iron deficiency anemia secondary to blood loss (chronic): Secondary | ICD-10-CM | POA: Diagnosis not present

## 2019-12-09 DIAGNOSIS — F40240 Claustrophobia: Secondary | ICD-10-CM | POA: Diagnosis present

## 2019-12-09 DIAGNOSIS — R600 Localized edema: Secondary | ICD-10-CM | POA: Diagnosis not present

## 2019-12-09 DIAGNOSIS — Z86010 Personal history of colonic polyps: Secondary | ICD-10-CM

## 2019-12-09 DIAGNOSIS — R609 Edema, unspecified: Secondary | ICD-10-CM | POA: Diagnosis not present

## 2019-12-09 DIAGNOSIS — J9811 Atelectasis: Secondary | ICD-10-CM | POA: Diagnosis not present

## 2019-12-09 HISTORY — PX: ENDARTERECTOMY FEMORAL: SHX5804

## 2019-12-09 HISTORY — DX: Cardiac arrhythmia, unspecified: I49.9

## 2019-12-09 HISTORY — PX: FEMORAL-POPLITEAL BYPASS GRAFT: SHX937

## 2019-12-09 LAB — BLOOD GAS, ARTERIAL
Acid-base deficit: 4.4 mmol/L — ABNORMAL HIGH (ref 0.0–2.0)
Bicarbonate: 20.6 mmol/L (ref 20.0–28.0)
Drawn by: 23604
FIO2: 28
O2 Saturation: 98.7 %
Patient temperature: 36.6
pCO2 arterial: 40.1 mmHg (ref 32.0–48.0)
pH, Arterial: 7.328 — ABNORMAL LOW (ref 7.350–7.450)
pO2, Arterial: 128 mmHg — ABNORMAL HIGH (ref 83.0–108.0)

## 2019-12-09 LAB — COMPREHENSIVE METABOLIC PANEL
ALT: 12 U/L (ref 0–44)
ALT: 14 U/L (ref 0–44)
AST: 18 U/L (ref 15–41)
AST: 21 U/L (ref 15–41)
Albumin: 3.9 g/dL (ref 3.5–5.0)
Albumin: 2.9 g/dL — ABNORMAL LOW (ref 3.5–5.0)
Alkaline Phosphatase: 87 U/L (ref 38–126)
Alkaline Phosphatase: 44 U/L (ref 38–126)
Anion gap: 9 (ref 5–15)
Anion gap: 10 (ref 5–15)
BUN: 29 mg/dL — ABNORMAL HIGH (ref 8–23)
BUN: 22 mg/dL (ref 8–23)
CO2: 26 mmol/L (ref 22–32)
CO2: 20 mmol/L — ABNORMAL LOW (ref 22–32)
Calcium: 9.3 mg/dL (ref 8.9–10.3)
Calcium: 7.9 mg/dL — ABNORMAL LOW (ref 8.9–10.3)
Chloride: 107 mmol/L (ref 98–111)
Chloride: 108 mmol/L (ref 98–111)
Creatinine, Ser: 1.18 mg/dL — ABNORMAL HIGH (ref 0.44–1.00)
Creatinine, Ser: 1.18 mg/dL — ABNORMAL HIGH (ref 0.44–1.00)
GFR calc Af Amer: 46 mL/min — ABNORMAL LOW (ref >60)
GFR calc Af Amer: 46 mL/min — ABNORMAL LOW (ref >60)
GFR calc non Af Amer: 40 mL/min — ABNORMAL LOW (ref >60)
GFR calc non Af Amer: 40 mL/min — ABNORMAL LOW (ref >60)
Glucose, Bld: 112 mg/dL — ABNORMAL HIGH (ref 70–99)
Glucose, Bld: 172 mg/dL — ABNORMAL HIGH (ref 70–99)
Potassium: 4 mmol/L (ref 3.5–5.1)
Potassium: 4.5 mmol/L (ref 3.5–5.1)
Sodium: 142 mmol/L (ref 135–145)
Sodium: 138 mmol/L (ref 135–145)
Total Bilirubin: 0.6 mg/dL (ref 0.3–1.2)
Total Bilirubin: 0.6 mg/dL (ref 0.3–1.2)
Total Protein: 6.1 g/dL — ABNORMAL LOW (ref 6.5–8.1)
Total Protein: 4.5 g/dL — ABNORMAL LOW (ref 6.5–8.1)

## 2019-12-09 LAB — URINALYSIS, ROUTINE W REFLEX MICROSCOPIC
Bilirubin Urine: NEGATIVE
Glucose, UA: NEGATIVE mg/dL
Hgb urine dipstick: NEGATIVE
Ketones, ur: NEGATIVE mg/dL
Nitrite: NEGATIVE
Protein, ur: NEGATIVE mg/dL
Specific Gravity, Urine: 1.019 (ref 1.005–1.030)
WBC, UA: >50 WBC/hpf — ABNORMAL HIGH (ref 0–5)
pH: 5 (ref 5.0–8.0)

## 2019-12-09 LAB — CBC
HCT: 42.1 % (ref 36.0–46.0)
HCT: 34.8 % — ABNORMAL LOW (ref 36.0–46.0)
Hemoglobin: 13.1 g/dL (ref 12.0–15.0)
Hemoglobin: 11.6 g/dL — ABNORMAL LOW (ref 12.0–15.0)
MCH: 28.3 pg (ref 26.0–34.0)
MCH: 29.4 pg (ref 26.0–34.0)
MCHC: 31.1 g/dL (ref 30.0–36.0)
MCHC: 33.3 g/dL (ref 30.0–36.0)
MCV: 90.9 fL (ref 80.0–100.0)
MCV: 88.3 fL (ref 80.0–100.0)
Platelets: 209 10*3/uL (ref 150–400)
Platelets: 135 10*3/uL — ABNORMAL LOW (ref 150–400)
RBC: 4.63 MIL/uL (ref 3.87–5.11)
RBC: 3.94 MIL/uL (ref 3.87–5.11)
RDW: 14 % (ref 11.5–15.5)
RDW: 13.9 % (ref 11.5–15.5)
WBC: 8.6 10*3/uL (ref 4.0–10.5)
WBC: 18.1 10*3/uL — ABNORMAL HIGH (ref 4.0–10.5)
nRBC: 0 % (ref 0.0–0.2)
nRBC: 0 % (ref 0.0–0.2)

## 2019-12-09 LAB — SURGICAL PCR SCREEN
MRSA, PCR: NEGATIVE
Staphylococcus aureus: POSITIVE — AB

## 2019-12-09 LAB — PROTIME-INR
INR: 1 (ref 0.8–1.2)
Prothrombin Time: 12.4 seconds (ref 11.4–15.2)

## 2019-12-09 LAB — GLUCOSE, CAPILLARY
Glucose-Capillary: 128 mg/dL — ABNORMAL HIGH (ref 70–99)
Glucose-Capillary: 162 mg/dL — ABNORMAL HIGH (ref 70–99)

## 2019-12-09 LAB — PREPARE RBC (CROSSMATCH)

## 2019-12-09 LAB — TROPONIN I (HIGH SENSITIVITY): Troponin I (High Sensitivity): 5 ng/L (ref <18)

## 2019-12-09 LAB — APTT: aPTT: 28 seconds (ref 24–36)

## 2019-12-09 SURGERY — BYPASS GRAFT FEMORAL-POPLITEAL ARTERY
Anesthesia: General | Laterality: Right

## 2019-12-09 MED ORDER — CHLORHEXIDINE GLUCONATE 0.12 % MT SOLN: 15.0000 mL | Freq: Once | OROMUCOSAL | Status: AC

## 2019-12-09 MED ORDER — ORAL CARE MOUTH RINSE: 15.0000 mL | Freq: Once | OROMUCOSAL | Status: AC

## 2019-12-09 MED ORDER — FENTANYL CITRATE (PF) 250 MCG/5ML IJ SOLN
INTRAMUSCULAR | Status: AC
  Filled 2019-12-09: qty 5

## 2019-12-09 MED ORDER — DILTIAZEM LOAD VIA INFUSION
5.0000 mg | Freq: Once | INTRAVENOUS | Status: AC
  Administered 2019-12-09: 5 mg via INTRAVENOUS
  Filled 2019-12-09: qty 5

## 2019-12-09 MED ORDER — CHLORHEXIDINE GLUCONATE CLOTH 2 % EX PADS: 6.0000 | MEDICATED_PAD | Freq: Once | CUTANEOUS | Status: DC

## 2019-12-09 MED ORDER — DILTIAZEM HCL-DEXTROSE 125-5 MG/125ML-% IV SOLN (PREMIX)
2.0000 mg/h | INTRAVENOUS | Status: DC
  Filled 2019-12-09: qty 125

## 2019-12-09 MED ORDER — METOPROLOL SUCCINATE ER 25 MG PO TB24
25.0000 mg | ORAL_TABLET | Freq: Every day | ORAL | Status: DC
  Administered 2019-12-11: 25 mg via ORAL
  Filled 2019-12-09: qty 1

## 2019-12-09 MED ORDER — SODIUM CHLORIDE 0.9 % IV BOLUS
500.0000 mL | Freq: Once | INTRAVENOUS | Status: AC | PRN
  Administered 2019-12-09: 500 mL via INTRAVENOUS

## 2019-12-09 MED ORDER — ONDANSETRON HCL 4 MG/2ML IJ SOLN
INTRAMUSCULAR | Status: AC
  Filled 2019-12-09: qty 6

## 2019-12-09 MED ORDER — ACETAMINOPHEN 325 MG PO TABS
325.0000 mg | ORAL_TABLET | ORAL | Status: DC | PRN
  Administered 2019-12-10: 650 mg via ORAL
  Filled 2019-12-09: qty 2

## 2019-12-09 MED ORDER — DEXAMETHASONE SODIUM PHOSPHATE 10 MG/ML IJ SOLN
INTRAMUSCULAR | Status: AC
  Filled 2019-12-09: qty 2

## 2019-12-09 MED ORDER — CHLORHEXIDINE GLUCONATE 0.12 % MT SOLN
OROMUCOSAL | Status: AC
  Administered 2019-12-09: 15 mL via OROMUCOSAL
  Filled 2019-12-09: qty 15

## 2019-12-09 MED ORDER — HEMOSTATIC AGENTS (NO CHARGE) OPTIME
TOPICAL | Status: DC | PRN
  Administered 2019-12-09: 1 via TOPICAL

## 2019-12-09 MED ORDER — MORPHINE SULFATE (PF) 2 MG/ML IV SOLN: 2.0000 mg | INTRAVENOUS | Status: DC | PRN

## 2019-12-09 MED ORDER — LIDOCAINE 2% (20 MG/ML) 5 ML SYRINGE
INTRAMUSCULAR | Status: AC
  Filled 2019-12-09: qty 5

## 2019-12-09 MED ORDER — PHENYLEPHRINE 40 MCG/ML (10ML) SYRINGE FOR IV PUSH (FOR BLOOD PRESSURE SUPPORT)
PREFILLED_SYRINGE | INTRAVENOUS | Status: AC
  Filled 2019-12-09: qty 10

## 2019-12-09 MED ORDER — PHENYLEPHRINE HCL (PRESSORS) 10 MG/ML IV SOLN
INTRAVENOUS | Status: AC
  Filled 2019-12-09: qty 1

## 2019-12-09 MED ORDER — LACTATED RINGERS IV SOLN: INTRAVENOUS | Status: DC | PRN

## 2019-12-09 MED ORDER — HEPARIN SODIUM (PORCINE) 1000 UNIT/ML IJ SOLN
INTRAMUSCULAR | Status: DC | PRN
  Administered 2019-12-09 (×2): 8000 [IU] via INTRAVENOUS

## 2019-12-09 MED ORDER — METOPROLOL TARTRATE 5 MG/5ML IV SOLN
2.0000 mg | INTRAVENOUS | Status: AC | PRN
  Administered 2019-12-10: 2.5 mg via INTRAVENOUS
  Administered 2019-12-10: 2 mg via INTRAVENOUS
  Filled 2019-12-09 (×3): qty 5

## 2019-12-09 MED ORDER — PANTOPRAZOLE SODIUM 40 MG PO TBEC
40.0000 mg | DELAYED_RELEASE_TABLET | Freq: Two times a day (BID) | ORAL | Status: DC
  Administered 2019-12-10 – 2019-12-13 (×6): 40 mg via ORAL
  Filled 2019-12-09 (×6): qty 1

## 2019-12-09 MED ORDER — 0.9 % SODIUM CHLORIDE (POUR BTL) OPTIME
TOPICAL | Status: DC | PRN
  Administered 2019-12-09: 1000 mL

## 2019-12-09 MED ORDER — DILTIAZEM HCL-DEXTROSE 125-5 MG/125ML-% IV SOLN (PREMIX)
5.0000 mg/h | INTRAVENOUS | Status: DC
  Administered 2019-12-09: 5 mg/h via INTRAVENOUS
  Filled 2019-12-09 (×2): qty 125

## 2019-12-09 MED ORDER — PROPOFOL 10 MG/ML IV BOLUS
INTRAVENOUS | Status: AC
  Filled 2019-12-09: qty 20

## 2019-12-09 MED ORDER — LACTATED RINGERS IV SOLN: INTRAVENOUS | Status: DC

## 2019-12-09 MED ORDER — GUAIFENESIN-DM 100-10 MG/5ML PO SYRP: 15.0000 mL | ORAL_SOLUTION | ORAL | Status: DC | PRN

## 2019-12-09 MED ORDER — PROTAMINE SULFATE 10 MG/ML IV SOLN
INTRAVENOUS | Status: AC
  Filled 2019-12-09: qty 15

## 2019-12-09 MED ORDER — PHENYLEPHRINE 40 MCG/ML (10ML) SYRINGE FOR IV PUSH (FOR BLOOD PRESSURE SUPPORT)
PREFILLED_SYRINGE | INTRAVENOUS | Status: DC | PRN
  Administered 2019-12-09 (×2): 80 ug via INTRAVENOUS
  Administered 2019-12-09 (×2): 40 ug via INTRAVENOUS

## 2019-12-09 MED ORDER — OXYCODONE-ACETAMINOPHEN 5-325 MG PO TABS
1.0000 | ORAL_TABLET | ORAL | Status: DC | PRN
  Administered 2019-12-10 – 2019-12-12 (×5): 1 via ORAL
  Filled 2019-12-09 (×5): qty 1

## 2019-12-09 MED ORDER — HEMOSTATIC AGENTS (NO CHARGE) OPTIME
TOPICAL | Status: DC | PRN
  Administered 2019-12-09 (×2): 1 via TOPICAL

## 2019-12-09 MED ORDER — ONDANSETRON HCL 4 MG/2ML IJ SOLN
4.0000 mg | Freq: Four times a day (QID) | INTRAMUSCULAR | Status: DC | PRN
  Administered 2019-12-09 – 2019-12-11 (×4): 4 mg via INTRAVENOUS
  Filled 2019-12-09 (×4): qty 2

## 2019-12-09 MED ORDER — ALBUMIN HUMAN 5 % IV SOLN: INTRAVENOUS | Status: DC | PRN

## 2019-12-09 MED ORDER — SODIUM CHLORIDE 0.9% IV SOLUTION: Freq: Once | INTRAVENOUS | Status: DC

## 2019-12-09 MED ORDER — ROCURONIUM BROMIDE 10 MG/ML (PF) SYRINGE
PREFILLED_SYRINGE | INTRAVENOUS | Status: AC
  Filled 2019-12-09: qty 10

## 2019-12-09 MED ORDER — HYDRALAZINE HCL 20 MG/ML IJ SOLN: 5.0000 mg | INTRAMUSCULAR | Status: DC | PRN

## 2019-12-09 MED ORDER — CEFAZOLIN SODIUM-DEXTROSE 2-4 GM/100ML-% IV SOLN
2.0000 g | INTRAVENOUS | Status: AC
  Administered 2019-12-09: 2 g via INTRAVENOUS
  Filled 2019-12-09: qty 100

## 2019-12-09 MED ORDER — ONDANSETRON HCL 4 MG/2ML IJ SOLN: 4.0000 mg | Freq: Once | INTRAMUSCULAR | Status: DC | PRN

## 2019-12-09 MED ORDER — PROTAMINE SULFATE 10 MG/ML IV SOLN
INTRAVENOUS | Status: DC | PRN
  Administered 2019-12-09: 50 mg via INTRAVENOUS
  Administered 2019-12-09: 100 mg via INTRAVENOUS

## 2019-12-09 MED ORDER — BACLOFEN 5 MG HALF TABLET
5.0000 mg | ORAL_TABLET | Freq: Two times a day (BID) | ORAL | Status: DC
  Administered 2019-12-11 – 2019-12-12 (×4): 5 mg via ORAL
  Filled 2019-12-09 (×10): qty 1

## 2019-12-09 MED ORDER — DEXAMETHASONE SODIUM PHOSPHATE 10 MG/ML IJ SOLN
INTRAMUSCULAR | Status: DC | PRN
  Administered 2019-12-09: 4 mg via INTRAVENOUS

## 2019-12-09 MED ORDER — FENTANYL CITRATE (PF) 100 MCG/2ML IJ SOLN: 25.0000 ug | INTRAMUSCULAR | Status: DC | PRN

## 2019-12-09 MED ORDER — MICROFIBRILLAR COLL HEMOSTAT EX PADS
MEDICATED_PAD | CUTANEOUS | Status: DC | PRN
  Administered 2019-12-09: 1 via TOPICAL

## 2019-12-09 MED ORDER — LABETALOL HCL 5 MG/ML IV SOLN: 10.0000 mg | INTRAVENOUS | Status: DC | PRN

## 2019-12-09 MED ORDER — ALBUTEROL SULFATE HFA 108 (90 BASE) MCG/ACT IN AERS
INHALATION_SPRAY | RESPIRATORY_TRACT | Status: DC | PRN
  Administered 2019-12-09: 2 via RESPIRATORY_TRACT

## 2019-12-09 MED ORDER — ASPIRIN EC 81 MG PO TBEC
81.0000 mg | DELAYED_RELEASE_TABLET | Freq: Every evening | ORAL | Status: DC
  Administered 2019-12-10 – 2019-12-12 (×3): 81 mg via ORAL
  Filled 2019-12-09 (×4): qty 1

## 2019-12-09 MED ORDER — LAMOTRIGINE 25 MG PO TABS
100.0000 mg | ORAL_TABLET | Freq: Two times a day (BID) | ORAL | Status: DC
  Administered 2019-12-10 – 2019-12-13 (×7): 100 mg via ORAL
  Filled 2019-12-09 (×7): qty 4

## 2019-12-09 MED ORDER — DOCUSATE SODIUM 100 MG PO CAPS
100.0000 mg | ORAL_CAPSULE | Freq: Every day | ORAL | Status: DC
  Administered 2019-12-13: 100 mg via ORAL
  Filled 2019-12-09 (×3): qty 1

## 2019-12-09 MED ORDER — MAGNESIUM SULFATE 2 GM/50ML IV SOLN: 2.0000 g | Freq: Every day | INTRAVENOUS | Status: DC | PRN

## 2019-12-09 MED ORDER — SODIUM CHLORIDE 0.9 % IV SOLN: INTRAVENOUS | Status: DC | PRN

## 2019-12-09 MED ORDER — SODIUM CHLORIDE 0.9 % IV SOLN
500.0000 mL | Freq: Once | INTRAVENOUS | Status: AC | PRN
  Administered 2019-12-09: 500 mL via INTRAVENOUS

## 2019-12-09 MED ORDER — LIDOCAINE 2% (20 MG/ML) 5 ML SYRINGE
INTRAMUSCULAR | Status: DC | PRN
  Administered 2019-12-09: 80 mg via INTRAVENOUS

## 2019-12-09 MED ORDER — DEXMEDETOMIDINE (PRECEDEX) IN NS 20 MCG/5ML (4 MCG/ML) IV SYRINGE
PREFILLED_SYRINGE | INTRAVENOUS | Status: DC | PRN
  Administered 2019-12-09: 8 ug via INTRAVENOUS

## 2019-12-09 MED ORDER — PHENOL 1.4 % MT LIQD: 1.0000 | OROMUCOSAL | Status: DC | PRN

## 2019-12-09 MED ORDER — ACETAMINOPHEN 650 MG RE SUPP: 325.0000 mg | RECTAL | Status: DC | PRN

## 2019-12-09 MED ORDER — POTASSIUM CHLORIDE CRYS ER 20 MEQ PO TBCR: 20.0000 meq | EXTENDED_RELEASE_TABLET | Freq: Every day | ORAL | Status: DC | PRN

## 2019-12-09 MED ORDER — SODIUM CHLORIDE 0.9 % IV SOLN
INTRAVENOUS | Status: AC
  Filled 2019-12-09: qty 1.2

## 2019-12-09 MED ORDER — ATORVASTATIN CALCIUM 40 MG PO TABS
40.0000 mg | ORAL_TABLET | Freq: Every day | ORAL | Status: DC
  Administered 2019-12-10 – 2019-12-12 (×3): 40 mg via ORAL
  Filled 2019-12-09 (×4): qty 1

## 2019-12-09 MED ORDER — BISACODYL 10 MG RE SUPP: 10.0000 mg | Freq: Every day | RECTAL | Status: DC | PRN

## 2019-12-09 MED ORDER — SODIUM CHLORIDE 0.9 % IV SOLN: INTRAVENOUS | Status: DC

## 2019-12-09 MED ORDER — CEFAZOLIN SODIUM-DEXTROSE 2-4 GM/100ML-% IV SOLN
2.0000 g | Freq: Three times a day (TID) | INTRAVENOUS | Status: AC
  Administered 2019-12-09 – 2019-12-10 (×2): 2 g via INTRAVENOUS
  Filled 2019-12-09 (×2): qty 100

## 2019-12-09 MED ORDER — ACETAMINOPHEN 500 MG PO TABS
1000.0000 mg | ORAL_TABLET | Freq: Once | ORAL | Status: DC
  Filled 2019-12-09: qty 2

## 2019-12-09 MED ORDER — SUGAMMADEX SODIUM 200 MG/2ML IV SOLN
INTRAVENOUS | Status: DC | PRN
  Administered 2019-12-09: 200 mg via INTRAVENOUS

## 2019-12-09 MED ORDER — ALUM & MAG HYDROXIDE-SIMETH 200-200-20 MG/5ML PO SUSP: 15.0000 mL | ORAL | Status: DC | PRN

## 2019-12-09 MED ORDER — PHENYLEPHRINE HCL-NACL 10-0.9 MG/250ML-% IV SOLN
INTRAVENOUS | Status: DC | PRN
  Administered 2019-12-09: 25 ug/min via INTRAVENOUS
  Administered 2019-12-09: 50 ug/min via INTRAVENOUS

## 2019-12-09 MED ORDER — PROPOFOL 10 MG/ML IV BOLUS
INTRAVENOUS | Status: DC | PRN
  Administered 2019-12-09: 100 mg via INTRAVENOUS
  Administered 2019-12-09: 20 mg via INTRAVENOUS

## 2019-12-09 MED ORDER — SENNOSIDES-DOCUSATE SODIUM 8.6-50 MG PO TABS: 1.0000 | ORAL_TABLET | Freq: Every evening | ORAL | Status: DC | PRN

## 2019-12-09 MED ORDER — SODIUM CHLORIDE 0.9 % IV SOLN
INTRAVENOUS | Status: DC | PRN
Start: 2019-12-09 — End: 2019-12-09

## 2019-12-09 MED ORDER — IRBESARTAN 300 MG PO TABS
300.0000 mg | ORAL_TABLET | Freq: Every day | ORAL | Status: DC
  Administered 2019-12-11: 300 mg via ORAL
  Filled 2019-12-09: qty 1

## 2019-12-09 MED ORDER — ROCURONIUM BROMIDE 10 MG/ML (PF) SYRINGE
PREFILLED_SYRINGE | INTRAVENOUS | Status: DC | PRN
  Administered 2019-12-09: 70 mg via INTRAVENOUS
  Administered 2019-12-09 (×3): 20 mg via INTRAVENOUS
  Administered 2019-12-09: 10 mg via INTRAVENOUS
  Administered 2019-12-09: 30 mg via INTRAVENOUS

## 2019-12-09 MED ORDER — DEXMEDETOMIDINE (PRECEDEX) IN NS 20 MCG/5ML (4 MCG/ML) IV SYRINGE
PREFILLED_SYRINGE | INTRAVENOUS | Status: AC
  Filled 2019-12-09: qty 5

## 2019-12-09 MED ORDER — ONDANSETRON HCL 4 MG/2ML IJ SOLN
INTRAMUSCULAR | Status: DC | PRN
  Administered 2019-12-09: 4 mg via INTRAVENOUS

## 2019-12-09 MED ORDER — FENTANYL CITRATE (PF) 250 MCG/5ML IJ SOLN
INTRAMUSCULAR | Status: DC | PRN
  Administered 2019-12-09 (×8): 50 ug via INTRAVENOUS

## 2019-12-09 SURGICAL SUPPLY — 72 items
ADH SKN CLS APL DERMABOND .7 (GAUZE/BANDAGES/DRESSINGS) ×2
AGENT HMST SPONGE THK3/8 (HEMOSTASIS) ×6
BANDAGE ESMARK 6X9 LF (GAUZE/BANDAGES/DRESSINGS) IMPLANT
BNDG CMPR 9X6 STRL LF SNTH (GAUZE/BANDAGES/DRESSINGS) ×2
BNDG ESMARK 6X9 LF (GAUZE/BANDAGES/DRESSINGS) ×4
CANISTER SUCT 3000ML PPV (MISCELLANEOUS) ×4 IMPLANT
CANNULA VESSEL 3MM 2 BLNT TIP (CANNULA) ×8 IMPLANT
CLIP VESOCCLUDE MED 24/CT (CLIP) ×4 IMPLANT
CLIP VESOCCLUDE SM WIDE 24/CT (CLIP) ×4 IMPLANT
COVER WAND RF STERILE (DRAPES) ×1 IMPLANT
CUFF TOURN SGL QUICK 24 (TOURNIQUET CUFF)
CUFF TOURN SGL QUICK 34 (TOURNIQUET CUFF) ×4
CUFF TOURN SGL QUICK 42 (TOURNIQUET CUFF) ×2 IMPLANT
CUFF TRNQT CYL 24X4X16.5-23 (TOURNIQUET CUFF) IMPLANT
CUFF TRNQT CYL 34X4.125X (TOURNIQUET CUFF) ×1 IMPLANT
DERMABOND ADVANCED (GAUZE/BANDAGES/DRESSINGS) ×2
DERMABOND ADVANCED .7 DNX12 (GAUZE/BANDAGES/DRESSINGS) ×2 IMPLANT
DRAIN HEMOVAC 1/8 X 5 (WOUND CARE) ×4 IMPLANT
DRAPE HALF SHEET 40X57 (DRAPES) IMPLANT
DRAPE X-RAY CASS 24X20 (DRAPES) IMPLANT
ELECT REM PT RETURN 9FT ADLT (ELECTROSURGICAL) ×4
ELECTRODE REM PT RTRN 9FT ADLT (ELECTROSURGICAL) ×2 IMPLANT
EVACUATOR SILICONE 100CC (DRAIN) ×6 IMPLANT
GAUZE SPONGE 4X4 16PLY XRAY LF (GAUZE/BANDAGES/DRESSINGS) ×6 IMPLANT
GLOVE BIO SURGEON STRL SZ7.5 (GLOVE) ×4 IMPLANT
GLOVE BIOGEL PI IND STRL 6 (GLOVE) IMPLANT
GLOVE BIOGEL PI IND STRL 6.5 (GLOVE) ×8 IMPLANT
GLOVE BIOGEL PI INDICATOR 6 (GLOVE) ×8
GLOVE BIOGEL PI INDICATOR 6.5 (GLOVE) ×16
GLOVE SURG SS PI 6.5 STRL IVOR (GLOVE) ×3 IMPLANT
GLOVE SURG SS PI 7.5 STRL IVOR (GLOVE) ×15 IMPLANT
GOWN STRL REUS W/ TWL LRG LVL3 (GOWN DISPOSABLE) ×6 IMPLANT
GOWN STRL REUS W/TWL LRG LVL3 (GOWN DISPOSABLE) ×36
GRAFT GORETEX 6X70  T/W (Vascular Products) ×4 IMPLANT
GRAFT GORETEX 6X70 T/W (Vascular Products) IMPLANT
HEMOSTAT ARISTA ABSORB 3G PWDR (HEMOSTASIS) ×2 IMPLANT
HEMOSTAT SPONGE AVITENE ULTRA (HEMOSTASIS) ×12 IMPLANT
KIT BASIN OR (CUSTOM PROCEDURE TRAY) ×4 IMPLANT
KIT TURNOVER KIT B (KITS) ×4 IMPLANT
LOOP VESSEL MINI RED (MISCELLANEOUS) ×9 IMPLANT
NS IRRIG 1000ML POUR BTL (IV SOLUTION) ×8 IMPLANT
PACK PERIPHERAL VASCULAR (CUSTOM PROCEDURE TRAY) ×4 IMPLANT
PAD ARMBOARD 7.5X6 YLW CONV (MISCELLANEOUS) ×8 IMPLANT
PADDING CAST ABS 6INX4YD NS (CAST SUPPLIES) ×2
PADDING CAST ABS COTTON 6X4 NS (CAST SUPPLIES) ×1 IMPLANT
PATCH VASC XENOSURE 1CMX6CM (Vascular Products) ×4 IMPLANT
PATCH VASC XENOSURE 1X6 (Vascular Products) IMPLANT
SET COLLECT BLD 21X3/4 12 (NEEDLE) IMPLANT
SPONGE LAP 18X18 RF (DISPOSABLE) ×4 IMPLANT
STOPCOCK 4 WAY LG BORE MALE ST (IV SETS) IMPLANT
SUT ETHILON 3 0 PS 1 (SUTURE) ×4 IMPLANT
SUT PROLENE 5 0 C 1 24 (SUTURE) ×12 IMPLANT
SUT PROLENE 6 0 CC (SUTURE) ×16 IMPLANT
SUT PROLENE 7 0 BV 1 (SUTURE) ×2 IMPLANT
SUT PROLENE 7 0 BV1 MDA (SUTURE) ×3 IMPLANT
SUT SILK 2 0 SH (SUTURE) ×4 IMPLANT
SUT SILK 3 0 (SUTURE) ×4
SUT SILK 3-0 18XBRD TIE 12 (SUTURE) ×2 IMPLANT
SUT VIC AB 2-0 SH 27 (SUTURE) ×8
SUT VIC AB 2-0 SH 27XBRD (SUTURE) ×4 IMPLANT
SUT VIC AB 3-0 SH 27 (SUTURE) ×16
SUT VIC AB 3-0 SH 27X BRD (SUTURE) ×8 IMPLANT
SUT VIC AB 4-0 PS2 18 (SUTURE) ×2 IMPLANT
SUT VIC AB 4-0 PS2 27 (SUTURE) ×8 IMPLANT
SUT VIC AB 4-0 SH 27 (SUTURE) ×4
SUT VIC AB 4-0 SH 27XANBCTRL (SUTURE) ×1 IMPLANT
TAPE UMBILICAL COTTON 1/8X30 (MISCELLANEOUS) ×2 IMPLANT
TOWEL GREEN STERILE (TOWEL DISPOSABLE) ×4 IMPLANT
TRAY FOLEY MTR SLVR 16FR STAT (SET/KITS/TRAYS/PACK) ×4 IMPLANT
TUBING EXTENTION W/L.L. (IV SETS) IMPLANT
UNDERPAD 30X36 HEAVY ABSORB (UNDERPADS AND DIAPERS) ×4 IMPLANT
WATER STERILE IRR 1000ML POUR (IV SOLUTION) ×4 IMPLANT

## 2019-12-09 NOTE — Transfer of Care (Signed)
Immediate Anesthesia Transfer of Care Note  Patient: [name] [name]  Procedure(s) Performed: REDO RIGHT FEMORAL-POPLITEAL ARTERY BYPASS GRAFT (Right ) ENDARTERECTOMY FEMORAL with bovine patch angioplasty. (Left )  Patient Location: PACU  Anesthesia Type:General  Level of Consciousness: awake and alert   Airway & Oxygen Therapy: Patient Spontanous Breathing and Patient connected to nasal cannula oxygen  Post-op Assessment: Report given to RN and Post -op Vital signs reviewed and stable  Post vital signs: Reviewed and stable  Last Vitals:  Vitals Value Taken Time  BP 225/190 12/09/19 1622  Temp    Pulse 81 12/09/19 1618  Resp 30 12/09/19 1622  SpO2 100 % 12/09/19 1618  Vitals shown include unvalidated device data.  Last Pain:  Vitals:   12/09/19 0624  PainSc: 3       Patients Stated Pain Goal: 4 (77/41/42 3953)  Complications: No complications documented.

## 2019-12-09 NOTE — Anesthesia Procedure Notes (Signed)
Arterial Line Insertion Start/End8/16/2021 7:15 AM, 12/09/2019 7:20 AM Performed by: Catalina Gravel, MD, anesthesiologist  Patient location: Pre-op. Preanesthetic checklist: patient identified, IV checked, site marked, risks and benefits discussed, surgical consent, monitors and equipment checked, pre-op evaluation, timeout performed and anesthesia consent Lidocaine 1% used for infiltration Left, radial was placed Catheter size: 20 Fr Hand hygiene performed  and maximum sterile barriers used   Attempts: 1 Procedure performed using ultrasound guided technique. Ultrasound Notes:anatomy identified, needle tip was noted to be adjacent to the nerve/plexus identified, no ultrasound evidence of intravascular and/or intraneural injection and image(s) printed for medical record Following insertion, dressing applied and Biopatch. Post procedure assessment: normal and unchanged    Patient tolerated the procedure well with no immediate complications.

## 2019-12-09 NOTE — Progress Notes (Signed)
Pt arrived to rm 18 from PACU with 5mg /hr cardizem drip. VSS. Initiated tele. CHG bath performed. Call bell within reach.   Lavenia Atlas, RN

## 2019-12-09 NOTE — Significant Event (Addendum)
Rapid Response Event Note   Reason for Call :  Called d/t SBP-70s.  Pt came to 4E from PACU. She had a redo R fem-pop bypass and L common fem endarterectomy pericardial patch. Cardizem gtt was started in PACU d/t Afib with RVR.   Initial Focused Assessment:  Pt laying in bed with eyes closed. Pt is very lethargic but is alert and oriented and is able to answer questions appropriately. Pt goes back to sleep very easily when not stimulated.  Pupils 2 and brisk. Lungs clear, diminished in the base. Skin pale and cool to touch.  B groin incisions WNL. B JP charged with bloody drainage in bulb. T-98, SBP-60s(via L radial A-line), HR-77(A-fib), RR-19, SpO2-96% on 2L [place].      Interventions:  Cardizem gtt d/c'd  1L NS bolus CBC CMP  Plan of Care:  Pt BP now 126/41 and pt is more awake. Hbg 13.1>11.6. Leave cardizem gtt off-as pt's afib is rate controlled-70s. Continue to monitor pt. Call RRT if further assistance needed.    Update: 2200-SBP-70s, pt sleepy again. Dr. Carlis Abbott notified and came to bedside. Trop, EKG, ABG, and additional 1L NS bolus ordered. BP up to 118/41.  Event Summary:   MD Notified: Dr. Carlis Abbott notified and came to bedside  Call [place]  Dillard Essex, RN

## 2019-12-09 NOTE — Interval H&P Note (Signed)
History and Physical Interval Note:  12/09/2019 7:22 AM  [name] [name]  has presented today for surgery, with the diagnosis of PERIPHERAL ARTERY DISEASE; REST PAIN RIGHT FOOT.  The various methods of treatment have been discussed with the patient and family. After consideration of risks, benefits and other options for treatment, the patient has consented to  Procedure(s): REDO RIGHT FEMORAL-POPLITEAL ARTERY BYPASS GRAFT (Right) ENDARTERECTOMY FEMORAL (Left) as a surgical intervention.  The patient's history has been reviewed, patient examined, no change in status, stable for surgery.  I have reviewed the patient's chart and labs.  Questions were answered to the patient's satisfaction.     [name]

## 2019-12-09 NOTE — Op Note (Signed)
Procedure: 1.  Redo right femoral to below-knee popliteal bypass with 6 mm PTFE  2.  Left common femoral endarterectomy bovine pericardial patch  Preoperative diagnosis: #1 rest pain right foot   #2  Recurrent left common femoral stenosis  Postoperative diagnosis: Same  Assistant: Risa Grill, PA-C to expedite procedure and provide assistance during construction of anastomosis  Operative details: After team informed consent, the patient was taken the operating.  The patient was placed in supine position operating table.  After induction general anesthesia and endotracheal intubation, a Foley catheter was placed.  Patient's entire right lower extremity and upper left lower extremity were prepped and draped in usual sterile fashion.  Longitudinal incision was made in the right groin carried down through subtenons tissues down the level of her pre-existing femoral-popliteal bypass.  This was PTFE material.  It was well incorporated.  It was dissected free circumferentially.  Dissection was then carried back up onto the level of the native common femoral artery and the common femoral artery was dissected free circumferentially all the way up to the level of the inguinal ligament.  It was fairly thickened but did have a decent pulse within it.  Profunda femoris and superficial femoral arteries were fairly scarred in I was able to get a vessel loop around the profunda which had 3 large branches.  Additional vessel loop was placed around the SFA and the femoropopliteal bypass graft.  Next a longitudinal incision was made through pre-existing scar on the medial aspect of the right leg.  The incision was carried down through subtenons tissues down the level of the fascia.  The fascia was incised with full length the incision.  The popliteal vein was identified and reflected posteriorly.  The below-knee popliteal artery was dissected free circumferentially.  It was about a 3 mm vessel.  It was fairly thickened  on palpation but did have soft areas.  Identified a previously placed femoral popliteal anastomosis and dissected out the artery below this circumferentially.  I took down the anterior tibial vein and put a vessel loop around the anterior tibial and tibioperoneal trunk as well.  Next a subsartorial tunnel was created by placing a tunnel between the heads of the gastrocnemius muscles subsartorial of the level of the groin.  A 6 mm PTFE thinwall stretch graft was then brought through this.    Attention was turned to the left groin.  Longitudinal incision was made left groin carried down through the subcutaneous tissues down the level of the left common femoral artery.  It was dissected free circumferentially.  It was heavily calcified in its midportion at an area of known stenosis.  Identified a pre-existing left femoral-popliteal bypass and placed a vessel loop around this as well as the native superficial femoral and profunda femoris arteries.  The artery did not have very good quality pulse presumably secondary to the stenosis in the common femoral artery.  There is a fair amount of calcified plaque and to get to a reasonable spot for clamping I had to dissect up underneath the inguinal ligament the circumflex iliac branches were dissected free circumferentially and Vesseloops placed around these.  Vessel loop was also placed around the distal external iliac artery.  At this point the patient was given 8000 units of intravenous heparin.  She was given an additional 8000 units of heparin during the course of the case.  ACT was kept above 250.  Attention was then turned back to the right groin.  The pre-existing femoral-popliteal bypass  was disconnected and an arteriotomy extended from the heel of this pre-existing bypass up into the native common femoral artery which extended onto the profunda.  The native superficial femoral artery apparently had been previously ligated I was unable to identify this.  There was  good backbleeding from the profunda.  The profunda came off the posterior aspect of the anastomosis.  I extended the arteriotomy longitudinally towards the inguinal ligament to get past a heavy area of calcification and narrowing of the native common femoral artery.  The 6 mm PTFE graft was then beveled creating an anastomosis about 3-1/2 to 4 cm long.  This was sewn with a running 5-0 Prolene suture.  Just prior to completion of anastomosis it was for blood backbled and thoroughly flushed.  Anastomosis was secured clamps released there was significant needle hole bleeding with otherwise the mechanical bleeding was stopped by tightening up the suture line.  Avitene was applied.  Attention was then turned to the below-knee popliteal area.  Below-knee popliteal artery was controlled proximally with a Henley clamp and distally with Vesseloops around the tibial vessels.  Longitudinal opening was made in the popliteal artery.  It was a fairly healthy vessel on its internal lumen although small again about 3 mm in diameter.  The graft was pulled taut the length and sewn endograft to side of popliteal artery using a running 6-0 Prolene suture.  Just prior to completion of anastomosis it was for blood backbled and thoroughly flushed reanastomosed was secured clamps released there is pulsatile flow in the graft immediately.  There was also a dorsalis pedis Doppler signal which augmented significantly with unclamping of the graft.  At this point we noticed that there was some redundancy of the graft at the groin level.  Therefore the graft was clamped proximally and distally and an additional 2 cm of graft excised and a new end-to-end anastomosis created to take the slack out of the graft.  This was done with a running 6-0 Prolene suture.  Flow was then restored the bypass graft and there was good Doppler signal.  Attention was then turned to the left groin.  The distal external iliac artery was controlled with a Henley  clamp.  The profunda and superficial femoral arteries as well as the femoropopliteal bypass were also controlled.  A Henley clamp was also placed on the femoropopliteal bypass just after the anastomosis.  Longitude opening was made in the common femoral artery just above the pre-existing femoropopliteal.  There was a large exophytic calcified plaque and some thickening and plaque posteriorly as well.  I extended the arteriotomy all the way up onto the distal external iliac artery.  Endarterectomy was then begun in a suitable plane and a good proximal endpoint was obtained.  There was a slight step-off on the distal endpoint and this was tacked with four 7-0 Prolene sutures.  A bovine pericardial patch was then brought up in the operative field and sewn on as a patch angioplasty using a running 6-0 Prolene suture.  This prior to completion of anastomosis it was for blood backbled and thoroughly flushed.  Anastomosis was secured clamps released was pulsatile flow in the common femoral artery and the femoropopliteal bypass immediately.  There was good Doppler flow in the dorsalis pedis area of the foot which augmented about 70 to 80% with unclamping of the graft.  Hemostasis was then obtained with administration of 150 mg of protamine as well as Avitene Arista and snow material to assist with hemostasis.  10 flat Jackson-Pratt drain was brought out of each groin incision and sewn to the skin with a 3-0 nylon suture.  Both groins were then closed in multiple layers with running 203 0 Vicryl suture.  Skin of both incisions closed with a 4-0 Vicryl subcuticular stitch.  Attention was turned to the below-knee popliteal incision this was also hemostatic at this point.  This was then closed with multiple layers of running 2-0 and 3-0 Vicryl suture and a 4-0 Vicryl subcuticular stitch in the skin.  Patient tired procedure well and there were no complications.  The instrument sponge needle counts correct in the case.  The  patient was taken the recovery in stable condition.  [name], MD [facility] Office: [phone]

## 2019-12-09 NOTE — Anesthesia Procedure Notes (Signed)
Procedure Name: Intubation Date/Time: 12/09/2019 7:37 AM Performed by: Wilburn Cornelia, CRNA Pre-anesthesia Checklist: Patient identified, Emergency Drugs available, Suction available, Patient being monitored and Timeout performed Patient Re-evaluated:Patient Re-evaluated prior to induction Oxygen Delivery Method: Circle system utilized Preoxygenation: Pre-oxygenation with 100% oxygen Induction Type: IV induction Ventilation: Mask ventilation without difficulty Laryngoscope Size: Mac and 3 Grade View: Grade I Tube size: 7.0 mm Number of attempts: 1 Airway Equipment and Method: Stylet Placement Confirmation: ETT inserted through vocal cords under direct vision,  positive ETCO2,  CO2 detector and breath sounds checked- equal and bilateral Secured at: 21 cm Tube secured with: Tape Dental Injury: Teeth and Oropharynx as per pre-operative assessment

## 2019-12-10 ENCOUNTER — Inpatient Hospital Stay (HOSPITAL_COMMUNITY): Payer: Medicare Other

## 2019-12-10 ENCOUNTER — Encounter (HOSPITAL_COMMUNITY): Payer: Self-pay | Admitting: Vascular Surgery

## 2019-12-10 DIAGNOSIS — K625 Hemorrhage of anus and rectum: Secondary | ICD-10-CM

## 2019-12-10 DIAGNOSIS — R9439 Abnormal result of other cardiovascular function study: Secondary | ICD-10-CM

## 2019-12-10 DIAGNOSIS — Z9889 Other specified postprocedural states: Secondary | ICD-10-CM | POA: Diagnosis not present

## 2019-12-10 DIAGNOSIS — R1084 Generalized abdominal pain: Secondary | ICD-10-CM

## 2019-12-10 DIAGNOSIS — K559 Vascular disorder of intestine, unspecified: Secondary | ICD-10-CM

## 2019-12-10 LAB — TYPE AND SCREEN
ABO/RH(D): O NEG
Antibody Screen: NEGATIVE
Donor AG Type: NEGATIVE
Donor AG Type: NEGATIVE
Unit division: 0
Unit division: 0

## 2019-12-10 LAB — COMPREHENSIVE METABOLIC PANEL
ALT: 9 U/L (ref 0–44)
AST: 22 U/L (ref 15–41)
Albumin: 2.8 g/dL — ABNORMAL LOW (ref 3.5–5.0)
Alkaline Phosphatase: 43 U/L (ref 38–126)
Anion gap: 13 (ref 5–15)
BUN: 25 mg/dL — ABNORMAL HIGH (ref 8–23)
CO2: 18 mmol/L — ABNORMAL LOW (ref 22–32)
Calcium: 7.6 mg/dL — ABNORMAL LOW (ref 8.9–10.3)
Chloride: 107 mmol/L (ref 98–111)
Creatinine, Ser: 1.23 mg/dL — ABNORMAL HIGH (ref 0.44–1.00)
GFR calc Af Amer: 44 mL/min — ABNORMAL LOW (ref >60)
GFR calc non Af Amer: 38 mL/min — ABNORMAL LOW (ref >60)
Glucose, Bld: 149 mg/dL — ABNORMAL HIGH (ref 70–99)
Potassium: 4.3 mmol/L (ref 3.5–5.1)
Sodium: 138 mmol/L (ref 135–145)
Total Bilirubin: 0.7 mg/dL (ref 0.3–1.2)
Total Protein: 4.7 g/dL — ABNORMAL LOW (ref 6.5–8.1)

## 2019-12-10 LAB — BPAM RBC
Blood Product Expiration Date: 202109182359
Blood Product Expiration Date: 202109192359
ISSUE DATE / TIME: 202108161431
ISSUE DATE / TIME: 202108161431
Unit Type and Rh: 9500
Unit Type and Rh: 9500

## 2019-12-10 LAB — LIPID PANEL
Cholesterol: 77 mg/dL (ref 0–200)
HDL: 30 mg/dL — ABNORMAL LOW (ref >40)
LDL Cholesterol: 32 mg/dL (ref 0–99)
Total CHOL/HDL Ratio: 2.6 RATIO
Triglycerides: 74 mg/dL (ref <150)
VLDL: 15 mg/dL (ref 0–40)

## 2019-12-10 LAB — HEMOGLOBIN AND HEMATOCRIT, BLOOD
HCT: 33.4 % — ABNORMAL LOW (ref 36.0–46.0)
HCT: 36.2 % (ref 36.0–46.0)
Hemoglobin: 11.4 g/dL — ABNORMAL LOW (ref 12.0–15.0)
Hemoglobin: 11.6 g/dL — ABNORMAL LOW (ref 12.0–15.0)

## 2019-12-10 LAB — CBC
HCT: 33.3 % — ABNORMAL LOW (ref 36.0–46.0)
Hemoglobin: 11.1 g/dL — ABNORMAL LOW (ref 12.0–15.0)
MCH: 29.7 pg (ref 26.0–34.0)
MCHC: 33.3 g/dL (ref 30.0–36.0)
MCV: 89 fL (ref 80.0–100.0)
Platelets: 128 10*3/uL — ABNORMAL LOW (ref 150–400)
RBC: 3.74 MIL/uL — ABNORMAL LOW (ref 3.87–5.11)
RDW: 14.4 % (ref 11.5–15.5)
WBC: 17.5 10*3/uL — ABNORMAL HIGH (ref 4.0–10.5)
nRBC: 0 % (ref 0.0–0.2)

## 2019-12-10 LAB — TROPONIN I (HIGH SENSITIVITY): Troponin I (High Sensitivity): 8 ng/L (ref <18)

## 2019-12-10 MED ORDER — IOHEXOL 9 MG/ML PO SOLN
500.0000 mL | ORAL | Status: AC
  Administered 2019-12-10 (×2): 500 mL via ORAL

## 2019-12-10 MED ORDER — CEFAZOLIN SODIUM-DEXTROSE 2-4 GM/100ML-% IV SOLN
2.0000 g | Freq: Two times a day (BID) | INTRAVENOUS | Status: DC
  Administered 2019-12-10 – 2019-12-13 (×6): 2 g via INTRAVENOUS
  Filled 2019-12-10 (×6): qty 100

## 2019-12-10 MED ORDER — IOHEXOL 300 MG/ML  SOLN
80.0000 mL | Freq: Once | INTRAMUSCULAR | Status: AC | PRN
  Administered 2019-12-10: 80 mL via INTRAVENOUS

## 2019-12-10 MED ORDER — METOCLOPRAMIDE HCL 5 MG/ML IJ SOLN
5.0000 mg | Freq: Once | INTRAMUSCULAR | Status: AC
  Administered 2019-12-10: 5 mg via INTRAVENOUS
  Filled 2019-12-10: qty 2

## 2019-12-10 MED ORDER — METRONIDAZOLE IN NACL 5-0.79 MG/ML-% IV SOLN
500.0000 mg | Freq: Three times a day (TID) | INTRAVENOUS | Status: DC
  Administered 2019-12-10 – 2019-12-13 (×9): 500 mg via INTRAVENOUS
  Filled 2019-12-10 (×9): qty 100

## 2019-12-10 MED ORDER — MUPIROCIN 2 % EX OINT
1.0000 "application " | TOPICAL_OINTMENT | Freq: Two times a day (BID) | CUTANEOUS | Status: DC
  Administered 2019-12-10 – 2019-12-13 (×7): 1 via NASAL
  Filled 2019-12-10 (×2): qty 22

## 2019-12-10 MED ORDER — CHLORHEXIDINE GLUCONATE CLOTH 2 % EX PADS
6.0000 | MEDICATED_PAD | Freq: Every day | CUTANEOUS | Status: DC
  Administered 2019-12-11 – 2019-12-13 (×3): 6 via TOPICAL

## 2019-12-10 NOTE — Consult Note (Addendum)
[facility] Consult: 9:49 AM 12/10/2019  LOS: 1 day    Referring Provider: Dr  Primary Care Physician:  Marin Olp, MD Primary Gastroenterologist:  Dr. Michela Pitcher and Dr Delfin Edis Dtr and most knowledgeable re pt's health hx is [facility] [phone].     Reason for Consultation: Bleeding per rectum   HPI: [name] [name] is a 86 y.o. female.  PMH listed below, pertinent for this admission is history peripheral artery disease and previous right femoropopliteal bypass.  Chronic Coumadin for peripheral vascular disease.  Discovered to have PAF during recent preoperative cardiology evaluation.  Intermittent thrombocytopenia dating to 2010.  Mild CKD, GFR 44 in 12/2018 GI history includes cholecystectomy.  Colon polyps.  Diverticulosis.  Lower GIB remotely.  GERD.   08/2006 colonoscopy for surveillance of adenomatous polyps.  4 small, sessile polyps were removed but not retrieved.  Left sided/sigmoid diverticulosis noted. 11/2006 flex sig for diarrhea.  Mild, nonspecific colitis noted in descending to sigmoid w rectal sparing, severe sigmoid diverticulosis.  Path: Lymphocytic colitis. 02/2018 esophagram for eval of meal associated coughing/choking: Intermittent, mid to distal esophageal dysmotility with tertiary contractions.  Contrast as well as pill eventually passed into the stomach.   On 12/09/2019 patient underwent redo right femoropopliteal bypass and left common femoral endarterectomy w pericardial patch. Chronic Coumadin held pre-op, INR 1.0 yesterday pre-op.   Received op site Heparin irrigation during surgery, no other use of AC agents.   81 ASA not yet restarted.   Received 2 PRBCs peri-op for EBL of 700 mL (anesthesia record)   Within a short period of time postop, patient developed nausea,  limited nonbloody emesis, left greater than right abdominal pain 10/10 at its worse, rapid A. fib, hypotension.  Overnight started passing bloody stools, probably about 5 episodes thus far with latest stool looking less bloody than previous.  Pain 10/10 at worst.  Initiated on Cardizem but this had to be discontinued due to hypotension.  Blood pressure responsive to boluses of IV fluids but hypotension would eventually recur. Cardiology consultation is pending  Patient had normal brown stool 3 d ago on Sunday.  Her BM pattern is brown, formed stool 3-4 times a week, occasional constipation.  Reflux symptoms well controlled with Protonix 40 bid.  Good appetite.  Actually gained some weight in the past year and a half due to decreased activity in the setting of social distancing.  Has been vaccinated for COVID-19.  Hgb 11.1 at 0400 today, was 12.1 10 d ago, and 13.1 preop yesterday.   WBCs 18.1 >> 17.5.   Platelets are 128.  Fm hx colon cancer in sister, colon polyps in sister, dad.    Past Medical History:  Diagnosis Date   Allergic rhinoconjunctivitis    Allergy    Anxiety    pt. managed- uses deep breathing    Arthritis    low back , stenosis   COLONIC POLYPS, RECURRENT 08/29/2006   2008 last colonoscopy. No further colonoscopy.      COPD (chronic obstructive pulmonary disease) ([place])  Diverticulosis    Dysrhythmia    afib   Eczema    Environmental allergies    allergy shot- q friday in Dr. Janee Morn office. PFT's abnormal- recommended Spiriva to use preop & will d/c after surgery   GERD (gastroesophageal reflux disease)    Hiatal hernia    Hyperlipidemia    Hypertension    Lung nodule 2011   Paroxysmal atrial fibrillation ([place]) 11/27/2019   Peripheral vascular disease (HCC)    Shingles    Stenosis of popliteal artery (HCC)    blood clots in legs long ago     Trigeminal neuralgia    Left buttocks    Past Surgical History:  Procedure Laterality Date    ABDOMINAL AORTAGRAM N/A 06/17/2011   Procedure: ABDOMINAL Maxcine Ham;  Surgeon: Elam Dutch, MD;  Location: [facility] CATH LAB;  Service: Cardiovascular;  Laterality: N/A;   ABDOMINAL AORTOGRAM W/LOWER EXTREMITY Bilateral 06/22/2018   Procedure: ABDOMINAL AORTOGRAM W/LOWER EXTREMITY;  Surgeon: Elam Dutch, MD;  Location: [facility] CV LAB;  Service: Cardiovascular;  Laterality: Bilateral;   ABDOMINAL AORTOGRAM W/LOWER EXTREMITY Bilateral 11/29/2019   Procedure: ABDOMINAL AORTOGRAM W/LOWER EXTREMITY;  Surgeon: Elam Dutch, MD;  Location: [facility] LAB;  Service: Cardiovascular;  Laterality: Bilateral;   CATARACT EXTRACTION, BILATERAL     w IOL   CHOLECYSTECTOMY  1998   COLONOSCOPY     FEMORAL-POPLITEAL BYPASS GRAFT        x2 surgeries 1990's & 2009   INJECTION KNEE Right Aug. 2016   Gel injection for pain   LUMBAR FUSION  07/06/2011   TONSILLECTOMY     as a teenager    TUBAL LIGATION      Prior to Admission medications   Medication Sig Start Date End Date Taking? Authorizing Provider  acetaminophen (TYLENOL) 500 MG tablet Take 1,000 mg by mouth 2 (two) times daily.    Yes [provider]  aspirin EC 81 MG tablet Take 81 mg by mouth every evening.    Yes [provider]  atorvastatin (LIPITOR) 40 MG tablet Take 1 tablet (40 mg total) by mouth daily. 12/07/18  Yes Marin Olp, MD  baclofen (LIORESAL) 10 MG tablet TAKE 1/2 (ONE-HALF) TABLET BY MOUTH ONCE DAILY NIGHTLY Patient taking differently: 5 mg 2 (two) times daily.  07/01/19  Yes Jamse Arn, MD  hydrochlorothiazide (HYDRODIURIL) 25 MG tablet TAKE 1 TABLET BY MOUTH AT BEDTIME 11/28/19  Yes Marin Olp, MD  irbesartan (AVAPRO) 300 MG tablet Take 1 tablet by mouth once daily 11/28/19  Yes Marin Olp, MD  lamoTRIgine (LAMICTAL) 100 MG tablet Take 1 tablet by mouth twice daily 11/08/19  Yes Jaffe, Adam R, DO  Menthol (ICY HOT) 5 % PTCH Apply 1 patch topically daily as needed (back  pain).   Yes [provider]  metoprolol succinate (TOPROL XL) 25 MG 24 hr tablet Take 1 tablet (25 mg total) by mouth daily. 11/27/19  Yes Skeet Latch, MD  Multiple Vitamin (MULTIVITAMIN WITH MINERALS) TABS tablet Take 1 tablet by mouth daily.   Yes [provider]  pantoprazole (PROTONIX) 40 MG tablet Take 1 tablet (40 mg total) by mouth 2 (two) times daily. 12/02/19  Yes Kozlow, Donnamarie Poag, MD  RESTASIS 0.05 % ophthalmic emulsion Place 1 drop into both eyes 2 (two) times daily as needed (dry eyes).  01/31/18  Yes [provider]  colchicine (COLCRYS) 0.6 MG tablet Take 2 pills at first sign gout flare, then another pill  2 hours later if still having pain. Then take once daily until pain resolves. 12/31/15   Marin Olp, MD  warfarin (COUMADIN) 5 MG tablet TAKE 1 TABLET BY MOUTH ONCE DAILY, EXCEPT TAKE 1 & 1/2 TABLETS ON MONDAY OR TAKE AS DIRECTED BY ANTICOAGULATION CLINIC Patient taking differently: Take 5 mg by mouth every evening.  09/03/19   Marin Olp, MD    Scheduled Meds:  sodium chloride   Intravenous Once   aspirin EC  81 mg Oral QPM   atorvastatin  40 mg Oral q1800   baclofen  5 mg Oral BID   Chlorhexidine Gluconate Cloth  6 each Topical Q0600   docusate sodium  100 mg Oral Daily   irbesartan  300 mg Oral Daily   lamoTRIgine  100 mg Oral BID   metoprolol succinate  25 mg Oral Daily   mupirocin ointment  1 application Nasal BID   pantoprazole  40 mg Oral BID   Infusions:  sodium chloride 100 mL/hr at 12/10/19 7096   diltiazem (CARDIZEM) infusion 5 mg/hr (12/09/19 1801)   magnesium sulfate bolus IVPB     PRN Meds: acetaminophen **OR** acetaminophen, alum & mag hydroxide-simeth, bisacodyl, guaiFENesin-dextromethorphan, hydrALAZINE, labetalol, magnesium sulfate bolus IVPB, metoprolol tartrate, morphine injection, ondansetron, oxyCODONE-acetaminophen, phenol, potassium chloride, senna-docusate   Allergies as of 11/22/2019 - Review  Complete 11/21/2019  Allergen Reaction Noted   Latex Itching and Rash 12/31/2018   Sulfa antibiotics Other (See Comments) 12/05/2011   Tiotropium bromide Shortness Of Breath and Other (See Comments) 06/21/2011   Ultram [tramadol] Nausea And Vomiting 03/06/2015    Family History  Problem Relation Age of Onset   Arthritis Mother    Hypertension Mother    Heart disease Father    Colon polyps Father    Breast cancer Sister    Breast cancer Daughter    Hypertension Son    Lung cancer Brother    Colon cancer Sister    Brain cancer Sister    Lung cancer Sister    Anesthesia problems Neg Hx    Hypotension Neg Hx    Malignant hyperthermia Neg Hx    Pseudochol deficiency Neg Hx     Social History   Socioeconomic History   Marital status: Single    Spouse name: Not on file   Number of children: 4   Years of education: 12   Highest education level: Not on file  Occupational History   Occupation: retired    Fish farm manager: RETIRED  Tobacco Use   Smoking status: Former Smoker    Packs/day: 2.00    Years: 30.00    Pack years: 60.00    Types: Cigarettes    Quit date: 06/18/1993    Years since quitting: 26.4   Smokeless tobacco: Never Used   Tobacco comment: QUIT IN 1995  Vaping Use   Vaping Use: Never used  Substance and Sexual Activity   Alcohol use: No    Alcohol/week: 0.0 standard drinks   Drug use: No   Sexual activity: Not on file  Other Topics Concern   Not on file  Social History Narrative   Patient is widowed with 4 children. 2 grandkids. Lives alone.    Patient is right handed.   Patient has high school education.   Patient drinks 1 cup daily.      Retired from [employer] for 28 years, works 2 days a week until 2016.       Hobbies: volunteers at [employer], [employer] from  church visits people.       No caffeine.    Social Determinants of Health   Financial Resource Strain:    Difficulty of Paying Living Expenses:    Food Insecurity:    Worried About Charity fundraiser in the Last Year:    Arboriculturist in the Last Year:   Transportation Needs:    Film/video editor (Medical):    Lack of Transportation (Non-Medical):   Physical Activity:    Days of Exercise per Week:    Minutes of Exercise per Session:   Stress:    Feeling of Stress :   Social Connections:    Frequency of Communication with Friends and Family:    Frequency of Social Gatherings with Friends and Family:    Attends Religious Services:    Active Member of Clubs or Organizations:    Attends Music therapist:    Marital Status:   Intimate Partner Violence:    Fear of Current or Ex-Partner:    Emotionally Abused:    Physically Abused:    Sexually Abused:     REVIEW OF SYSTEMS: Constitutional: Feels a little worn out today.  Normally has good energy level. ENT:  No nose bleeds Pulm: No cough, no shortness of breath. CV:  No subjective palpitations, no LE edema.  No angina. GU:  No hematuria, no frequency GI: See HPI. Heme: Denies previous history of excessive bleeding or bruising. Transfusions: None Neuro:  No headaches, no peripheral tingling or numbness.  No seizures, no syncope. Derm:  No itching, no rash or sores.  Endocrine:  No sweats or chills.  No polyuria or dysuria Immunization: Has completed COVID-19 vaccination Travel:  None beyond local counties in last few months.    PHYSICAL EXAM: Vital signs in last 24 hours: Vitals:   12/10/19 0555 12/10/19 0809  BP:  (!) 105/58  Pulse: 84 100  Resp: 20 18  Temp:  98.7 F (37.1 C)  SpO2: 99% 98%   Wt Readings from Last 3 Encounters:  12/09/19 74.5 kg  12/03/19 74.5 kg  11/30/19 73.8 kg    General: Patient looks at least 10 years younger than stated age of 65.  Alert, a bit pale and a bit tired/sleepy but comfortable and not acutely ill-appearing Head: No facial asymmetry or swelling.  No signs of head trauma. Eyes: No  scleral icterus.  No conjunctival pallor. Ears: Not hard of hearing Nose: No congestion or discharge Mouth: Mucosa is dry, pink, clear.  Edentulous.  Tongue midline. Neck: No JVD, no masses, no thyromegaly Lungs: Clear to auscultation bilaterally without labored breathing or cough Heart: Irregularly irregular, heart rate running from low 100s up into the 120s.  S1, S2 present. Abdomen: Not distended.  Tenderness diffusely but most prominent on the left abdomen where there is some slight guarding.  Surgical clips and JP drains containing sanguinous material at CDI incisions in both groins.  No significant groin or surgical site associated bruising Rectal: Deferred, saw a slightly reddish-medium brown liquid stool in a collection jar. Musc/Skeltl: No joint deformity or erythema Extremities: Slight swelling without pitting at the tops of the feet and ankles bilaterally.  Both feet are warm. Neurologic: Alert.  Appropriate.  Oriented x3.  No tremor, no gross weakness or deficits. Skin: No rash, no sores, no telangiectasia Nodes: No cervical adenopathy Psych: Cooperative, calm, pleasant.  Intake/Output from previous day: 08/16 0701 - 08/17 0700 In: 7212.8 [I.V.:5999.8; MOLMB:867; IV Piggyback:350] Out: 5449 [Urine:850;  Drains:25; Stool:1; Blood:700] Intake/Output this shift: No intake/output data recorded.  LAB RESULTS: Recent Labs    12/09/19 0637 12/09/19 2033 12/10/19 0400  WBC 8.6 18.1* 17.5*  HGB 13.1 11.6* 11.1*  HCT 42.1 34.8* 33.3*  PLT 209 135* 128*   BMET Lab Results  Component Value Date   NA 138 12/09/2019   NA 142 12/09/2019   NA 140 11/30/2019   K 4.5 12/09/2019   K 4.0 12/09/2019   K 3.8 11/30/2019   CL 108 12/09/2019   CL 107 12/09/2019   CL 105 11/30/2019   CO2 20 (L) 12/09/2019   CO2 26 12/09/2019   CO2 25 11/30/2019   GLUCOSE 172 (H) 12/09/2019   GLUCOSE 112 (H) 12/09/2019   GLUCOSE 109 (H) 11/30/2019   BUN 22 12/09/2019   BUN 29 (H) 12/09/2019    BUN 24 (H) 11/30/2019   CREATININE 1.18 (H) 12/09/2019   CREATININE 1.18 (H) 12/09/2019   CREATININE 1.08 (H) 11/30/2019   CALCIUM 7.9 (L) 12/09/2019   CALCIUM 9.3 12/09/2019   CALCIUM 8.9 11/30/2019   LFT Recent Labs    12/09/19 0637 12/09/19 2033  PROT 6.1* 4.5*  ALBUMIN 3.9 2.9*  AST 18 21  ALT 12 14  ALKPHOS 87 44  BILITOT 0.6 0.6   PT/INR Lab Results  Component Value Date   INR 1.0 12/09/2019   INR 1.2 11/29/2019   INR 1.8 (A) 11/21/2019      RADIOLOGY STUDIES: No results found.    IMPRESSION:   *   Bleeding PR w L >> R abdominal pain/tenderness.  Associated symptoms of nonbloody emesis, hypotension in the setting of rapid A. fib.  Suspect ischemic colitis Hx adenomatous polyps prior to 2008.  Small polyps removed but not sent to pathology in 2008.  Diverticulosis on colonoscopies 2008.  Lymphocytic colitis in 2008, treated for a short time w [facility].  No diarrhea at home, tends to constipation actually.    *   Non-bloody emesis and nausea post op . Hx GERD.  On  Chronic bid Protonix w generally well controlled sxs.   *   Normocytic anemia.  Mild.  Received 2 PRBCs in peri-op period for surgical EBL of 700  ML.    *   Chronic Coumadin for PVD and recent dx PAF.  On hold.  INR 1 yesterday.    *   PAF, now w rapid afib, treated w (now dc'd) Cardizem gtt.  Metoprolol ordered this AM but not yet given due to low BP. Awaiting Cards consult   *   Intermittent Thrombocytopenia for  > 10 yrs.  Non-critical.    *   PAD.  S/p 12/09/19 R redo fem-pop BPG and L femoral endarterectomy  *   Mild renal insufficiency.  BUN/creat 22/1.18, stable.      PLAN:     *   CTAP to assess for ischemic colitis, ischemia.   Not ready to commit pt to colonoscopy as has higher risk of perforation.  Fortunately blood in stool has improved at last BM  *   H and H today x 2 and CBC in AM.    *   Clear liquid diet once we decide about CT or not.   *   Start scheduled Ancef,  request pharmacy to dose.       *   Give 1 dose Reglan 5g IV to help her tolerate the oral contrast.       Azucena Freed  12/10/2019, 9:49  AM Phone [phone]

## 2019-12-10 NOTE — Progress Notes (Signed)
OT Cancellation Note  Patient Details Name: [name] [name] MRN: [id number] DOB: 13-Dec-1924   Cancelled Treatment:    Reason Eval/Treat Not Completed: Medical issues which prohibited therapy; spoke with RN who reports GI issues (GI consulted), hypotensive overnight, asks to hold therapies at this time. Will follow up as able.  Lou Cal, OT Acute Rehabilitation Services Pager [phone] Office [phone]  Raymondo Band 12/10/2019, 1:34 PM

## 2019-12-10 NOTE — Progress Notes (Signed)
Patient BP 74/51.  Patient lethargic but alert x4  Patient however does go right back to sleep when not stimulated  Patient states that she is hot and that she is has pain at her groin.  RN stopped the cardizem drip  Notified Rapid response and provider on call  Received orders for 500 cc bolus and then repeat x1  New lab orders received for CBC CMP  Patient BP 126/41 and more awake and responding to RN

## 2019-12-10 NOTE — Progress Notes (Signed)
Removed JP drains from both groins. RN at bedside removing arterial line. Small amount of blood per rectum with urination. BP per cuff stable sys 110s Discussed with GI service. CT with contrast ordered. Will repeat serum Cr now. Updated daughter at bedside and daughter Jenny Reichmann via phone.

## 2019-12-10 NOTE — Progress Notes (Signed)
Post-Op Ankle brachial pressure complete.  Difficult evaluation- please see CV Proc tab for preliminary results. Lita Mains- RDMS, RVT 5:31 PM  12/10/2019

## 2019-12-10 NOTE — Progress Notes (Addendum)
Patient states she has been having pain/buring with urination  Per patient Possibly started after her angiogram prior to admission. Patient also stated that her urine smells. Foley removed at 0230, Patient bladder scanned and 42ml found in bladder. Risa Grill [facility] made aware and orders received. Will monitor patient.  Desirae Mancusi, [place]

## 2019-12-10 NOTE — Progress Notes (Signed)
PT Cancellation Note  Patient Details Name: [name] [name] MRN: [id number] DOB: 06/22/1924   Cancelled Treatment:    Reason Eval/Treat Not Completed: Medical issues which prohibited therapy;Patient not medically ready. Will check back tomorrow.   [name] 12/10/2019, 2:18 PM [place] Pager [phone] Office [phone]

## 2019-12-10 NOTE — Progress Notes (Addendum)
PHARMACIST LIPID MONITORING   [name] [name] is a 86 y.o. female admitted on 12/09/2019 with planned PVD surgical intervention.  Pharmacy has been consulted to optimize lipid-lowering therapy with the indication of secondary prevention for clinical ASCVD.  Recent Labs:  Lipid Panel (last 6 months):   Lab Results  Component Value Date   CHOL 77 12/10/2019   TRIG 74 12/10/2019   HDL 30 (L) 12/10/2019   CHOLHDL 2.6 12/10/2019   VLDL 15 12/10/2019   LDLCALC 32 12/10/2019    Hepatic function panel (last 6 months):   Lab Results  Component Value Date   AST 21 12/09/2019   ALT 14 12/09/2019   ALKPHOS 44 12/09/2019   BILITOT 0.6 12/09/2019    SCr (since admission):   Serum creatinine: 1.18 mg/dL (H) 12/09/19 2033 Estimated creatinine clearance: 26.2 mL/min (A)  Current lipid-lowering therapy: atorvastatin 40 mg  Previous lipid-lowering therapies (if applicable): On atorvastatin 40 mg (PTA), simvastatin 20/40 mg  Documented or reported allergies or intolerances to lipid-lowering therapies (if applicable): No allergies   Assessment:  Patient is currently at goal  Recommendation per protocol:  Continue current lipid-lowering therapy.  Follow-up with:  Primary care provider - Marin Olp, MD  Follow-up labs after discharge:    Liver function panel and lipid panel in 8-12 weeks then annually  Plan: Continue current statin dosing at atorvastatin 40 mg daily   Antonietta Jewel, PharmD, [place] Pharmacist  Phone: [phone] 12/10/2019 8:45 AM  Please check AMION for all [place] phone numbers After 10:00 PM, call [place] [phone]

## 2019-12-10 NOTE — Progress Notes (Signed)
Patient SBP in the 70s  Patient not less awake again  Notified Rapid response and Dr. Carlis Abbott  New orders received for EKG and Trop & ABG  NS bolus of 500 cc ordered with x1 repeat if needed

## 2019-12-10 NOTE — Progress Notes (Signed)
Inpatient Rehab Admissions Coordinator:   Met with Pt. And daughter to discuss potential inpatient rehab stay. Pt.'s daughter states that they are already awaiting SNF placement for Pt. Pt. States that she is not totally sure that she wants to go to SNF and wants to see how she does with therapies tomorrow and what they think she needs before making a decision about SNF vs. CIR.

## 2019-12-10 NOTE — Progress Notes (Signed)
Pharmacy Antibiotic Note  [name] [name] is a 86 y.o. female admitted on 12/09/2019 with suspect ischemic colitis.  Pharmacy has been consulted for cefazolin dosing.  WBC trending up to 17.5, afebrile. Scr 1.18 (CrCl 26 mL/min). Plan for CT abdomen and pelvis today.  Plan: Start cefazolin 2g IV every 12 hours Monitor renal fx, cx results, clinical pic, and GI w/u  Height: 4\' 11"  (149.9 cm) Weight: 74.5 kg (164 lb 3.2 oz) IBW/kg (Calculated) : 43.2  Temp (24hrs), Avg:98 F (36.7 C), Min:97.3 F (36.3 C), Max:98.8 F (37.1 C)  Recent Labs  Lab 12/09/19 0637 12/09/19 2033 12/10/19 0400  WBC 8.6 18.1* 17.5*  CREATININE 1.18* 1.18*  --     Estimated Creatinine Clearance: 26.2 mL/min (A) (by C-G formula based on SCr of 1.18 mg/dL (H)).    Allergies  Allergen Reactions  . Sulfa Antibiotics Other (See Comments)    Cold sweat light headed and disorientation  . Tiotropium Bromide Shortness Of Breath and Other (See Comments)    Sore throat also  . Latex Itching and Rash  . Ultram [Tramadol] Nausea And Vomiting  . 5-Alpha Reductase Inhibitors     Antimicrobials this admission: Cefazolin 8/16 >>   Dose adjustments this admission: N/A  Microbiology results: 8/16 UCx: sent  8/16 MRSA PCR: neg, staph aureus +  Thank you for allowing pharmacy to be a part of this patient's care.  Antonietta Jewel, PharmD, [place] Clinical Pharmacist  Phone: [phone] 12/10/2019 12:14 PM  Please check AMION for all [place] phone numbers After 10:00 PM, call [place] [phone]

## 2019-12-10 NOTE — Anesthesia Postprocedure Evaluation (Signed)
Anesthesia Post Note  Patient: [name] [name]  Procedure(s) Performed: REDO RIGHT FEMORAL-POPLITEAL ARTERY BYPASS GRAFT (Right ) ENDARTERECTOMY FEMORAL with bovine patch angioplasty. (Left )     Patient location during evaluation: PACU Anesthesia Type: General Level of consciousness: awake Pain management: pain level controlled Vital Signs Assessment: post-procedure vital signs reviewed and stable Respiratory status: spontaneous breathing, nonlabored ventilation, respiratory function stable and patient connected to nasal cannula oxygen Cardiovascular status: blood pressure returned to baseline and stable Postop Assessment: no apparent nausea or vomiting Anesthetic complications: no   No complications documented.  Last Vitals:  Vitals:   12/10/19 0555 12/10/19 0809  BP:  (!) 105/58  Pulse: 84 100  Resp: 20 18  Temp:  37.1 C  SpO2: 99% 98%    Last Pain:  Vitals:   12/10/19 0809  TempSrc: Oral  PainSc:                  Catalina Gravel

## 2019-12-10 NOTE — Progress Notes (Addendum)
C/o dysuria. Will check UA Arterial line consistnetly reading >>20 mmHg so will discontinue.  [facility] consulted for rectal bleeding. Keep NPO until evaluated. Maintain IVF NS at 100cc/hr Serial H and H (q 6 hours) transfuse if Hgb drops <8 g/dL

## 2019-12-10 NOTE — Progress Notes (Addendum)
Progress Note    12/10/2019 7:36 AM 1 Day Post-Op  Subjective:  Primary complaint is nausea. No vomiting. Hypotensive yesterday evening. Cardizem infusion for a. Fib/ rvr discontinued and patient has received several NS boluses.    Vitals:   12/10/19 0407 12/10/19 0555  BP: (!) 112/58   Pulse: 92 84  Resp: 18 20  Temp: 98.8 F (37.1 C)   SpO2: 100% 99%   Tele: a. Fib; rate controlled  Physical Exam: General: awake, alert in NAD. Cardiac:  Irregular rhythm, reg rate Lungs:  CTAB Incisions:  Well approximated. No hematoma or drainage Extremities:  Moves all well. Sensation intact. + DP, PT Doppler signals bilaterally. Right JP = 10cc thin, bloody Left JP = 25cc thin, bloody Abdomen:  Soft, ND, no significant TTP Watery, dark red rectal output in specimen jar CBC    Component Value Date/Time   WBC 17.5 (H) 12/10/2019 0400   RBC 3.74 (L) 12/10/2019 0400   HGB 11.1 (L) 12/10/2019 0400   HCT 33.3 (L) 12/10/2019 0400   PLT 128 (L) 12/10/2019 0400   MCV 89.0 12/10/2019 0400   MCH 29.7 12/10/2019 0400   MCHC 33.3 12/10/2019 0400   RDW 14.4 12/10/2019 0400   LYMPHSABS 1.9 06/10/2019 1132   MONOABS 0.6 06/10/2019 1132   EOSABS 0.1 06/10/2019 1132   BASOSABS 0.1 06/10/2019 1132    BMET    Component Value Date/Time   NA 138 12/09/2019 2033   NA 141 11/27/2019 1026   K 4.5 12/09/2019 2033   CL 108 12/09/2019 2033   CO2 20 (L) 12/09/2019 2033   GLUCOSE 172 (H) 12/09/2019 2033   BUN 22 12/09/2019 2033   BUN 26 11/27/2019 1026   CREATININE 1.18 (H) 12/09/2019 2033   CREATININE 1.17 (H) 05/08/2019 1402   CALCIUM 7.9 (L) 12/09/2019 2033   GFRNONAA 40 (L) 12/09/2019 2033   GFRAA 46 (L) 12/09/2019 2033     Intake/Output Summary (Last 24 hours) at 12/10/2019 0736 Last data filed at 12/10/2019 0559 Gross per 24 hour  Intake 7212.83 ml  Output 1576 ml  Net 5636.83 ml    HOSPITAL MEDICATIONS Scheduled Meds: . sodium chloride   Intravenous Once  . aspirin EC  81 mg  Oral QPM  . atorvastatin  40 mg Oral q1800  . baclofen  5 mg Oral BID  . Chlorhexidine Gluconate Cloth  6 each Topical Q0600  . docusate sodium  100 mg Oral Daily  . irbesartan  300 mg Oral Daily  . lamoTRIgine  100 mg Oral BID  . metoprolol succinate  25 mg Oral Daily  . mupirocin ointment  1 application Nasal BID  . pantoprazole  40 mg Oral BID   Continuous Infusions: . sodium chloride Stopped (12/09/19 1901)  . diltiazem (CARDIZEM) infusion 5 mg/hr (12/09/19 1801)  . magnesium sulfate bolus IVPB     PRN Meds:.acetaminophen **OR** acetaminophen, alum & mag hydroxide-simeth, bisacodyl, guaiFENesin-dextromethorphan, hydrALAZINE, labetalol, magnesium sulfate bolus IVPB, metoprolol tartrate, morphine injection, ondansetron, oxyCODONE-acetaminophen, phenol, potassium chloride, senna-docusate  Assessment:   redo R fem-pop bypass and L common fem endarterectomy pericardial patch.POD 1. Prior left fem-pop bypass. Both lower extremities well perfused -hypotension> resolved with IVFs and holding Cardizem. Normal troponin. No acute ECG changes -painless rectal bleeding>most likely diverticular vs. hemorrhoidal -good UOP and Scr is at baseline  Plan:  -Consult GI. Keep NPO. Continue serial H and H. -Hgb stable but has likely not equilibrated.  -DC J-P drains today  SANDRA SETZER, PA-C [facility] [phone] 12/10/2019  7:36 AM   Agree with above.  Patent fem pop bilaterally with warm feet no hematoma incisions.  D/c JPs today both groin  GI to see to rule out ongoing GI bleed  Will have cardiology review for assistance with afib rate control  Needs Rehab consult PT OT over next few days  Check post op ABI  [name], MD [facility] [place] Office: [phone]

## 2019-12-10 NOTE — Progress Notes (Signed)
Patient stated that she needed to have a bowel movement  Patient with medium amount of red blood from rectum, patient states that her abdomen hurts extremely bad that she feels that she is constipated   RN notified Dr. Carlis Abbott.  Advised to pull CBC at 0300 and keep patient NPO for the night and to continue to monitor BP

## 2019-12-11 ENCOUNTER — Telehealth: Payer: Self-pay

## 2019-12-11 DIAGNOSIS — R933 Abnormal findings on diagnostic imaging of other parts of digestive tract: Secondary | ICD-10-CM

## 2019-12-11 DIAGNOSIS — K922 Gastrointestinal hemorrhage, unspecified: Secondary | ICD-10-CM

## 2019-12-11 DIAGNOSIS — I739 Peripheral vascular disease, unspecified: Secondary | ICD-10-CM

## 2019-12-11 LAB — URINALYSIS, ROUTINE W REFLEX MICROSCOPIC
Bilirubin Urine: NEGATIVE
Glucose, UA: NEGATIVE mg/dL
Ketones, ur: NEGATIVE mg/dL
Nitrite: NEGATIVE
Protein, ur: NEGATIVE mg/dL
Specific Gravity, Urine: 1.032 — ABNORMAL HIGH (ref 1.005–1.030)
WBC, UA: >50 WBC/hpf — ABNORMAL HIGH (ref 0–5)
pH: 5 (ref 5.0–8.0)

## 2019-12-11 LAB — POCT I-STAT, CHEM 8
BUN: 25 mg/dL — ABNORMAL HIGH (ref 8–23)
BUN: 24 mg/dL — ABNORMAL HIGH (ref 8–23)
Calcium, Ion: 1.21 mmol/L (ref 1.15–1.40)
Calcium, Ion: 1.14 mmol/L — ABNORMAL LOW (ref 1.15–1.40)
Chloride: 107 mmol/L (ref 98–111)
Chloride: 105 mmol/L (ref 98–111)
Creatinine, Ser: 0.8 mg/dL (ref 0.44–1.00)
Creatinine, Ser: 0.7 mg/dL (ref 0.44–1.00)
Glucose, Bld: 126 mg/dL — ABNORMAL HIGH (ref 70–99)
Glucose, Bld: 143 mg/dL — ABNORMAL HIGH (ref 70–99)
HCT: 32 % — ABNORMAL LOW (ref 36.0–46.0)
HCT: 35 % — ABNORMAL LOW (ref 36.0–46.0)
Hemoglobin: 10.9 g/dL — ABNORMAL LOW (ref 12.0–15.0)
Hemoglobin: 11.9 g/dL — ABNORMAL LOW (ref 12.0–15.0)
Potassium: 4 mmol/L (ref 3.5–5.1)
Potassium: 4.7 mmol/L (ref 3.5–5.1)
Sodium: 143 mmol/L (ref 135–145)
Sodium: 140 mmol/L (ref 135–145)
TCO2: 23 mmol/L (ref 22–32)
TCO2: 22 mmol/L (ref 22–32)

## 2019-12-11 LAB — POCT I-STAT 7, (LYTES, BLD GAS, ICA,H+H)
Acid-Base Excess: 0 mmol/L (ref 0.0–2.0)
Acid-base deficit: 1 mmol/L (ref 0.0–2.0)
Bicarbonate: 25.7 mmol/L (ref 20.0–28.0)
Bicarbonate: 24.5 mmol/L (ref 20.0–28.0)
Calcium, Ion: 1.25 mmol/L (ref 1.15–1.40)
Calcium, Ion: 1.18 mmol/L (ref 1.15–1.40)
HCT: 35 % — ABNORMAL LOW (ref 36.0–46.0)
HCT: 23 % — ABNORMAL LOW (ref 36.0–46.0)
Hemoglobin: 11.9 g/dL — ABNORMAL LOW (ref 12.0–15.0)
Hemoglobin: 7.8 g/dL — ABNORMAL LOW (ref 12.0–15.0)
O2 Saturation: 100 %
O2 Saturation: 100 %
Patient temperature: 35.8
Patient temperature: 36.7
Potassium: 4.6 mmol/L (ref 3.5–5.1)
Potassium: 5.1 mmol/L (ref 3.5–5.1)
Sodium: 141 mmol/L (ref 135–145)
Sodium: 141 mmol/L (ref 135–145)
TCO2: 27 mmol/L (ref 22–32)
TCO2: 26 mmol/L (ref 22–32)
pCO2 arterial: 45.1 mmHg (ref 32.0–48.0)
pCO2 arterial: 40.3 mmHg (ref 32.0–48.0)
pH, Arterial: 7.359 (ref 7.350–7.450)
pH, Arterial: 7.39 (ref 7.350–7.450)
pO2, Arterial: 252 mmHg — ABNORMAL HIGH (ref 83.0–108.0)
pO2, Arterial: 212 mmHg — ABNORMAL HIGH (ref 83.0–108.0)

## 2019-12-11 LAB — BASIC METABOLIC PANEL
Anion gap: 7 (ref 5–15)
BUN: 23 mg/dL (ref 8–23)
CO2: 23 mmol/L (ref 22–32)
Calcium: 7.2 mg/dL — ABNORMAL LOW (ref 8.9–10.3)
Chloride: 105 mmol/L (ref 98–111)
Creatinine, Ser: 1.32 mg/dL — ABNORMAL HIGH (ref 0.44–1.00)
GFR calc Af Amer: 40 mL/min — ABNORMAL LOW (ref >60)
GFR calc non Af Amer: 35 mL/min — ABNORMAL LOW (ref >60)
Glucose, Bld: 140 mg/dL — ABNORMAL HIGH (ref 70–99)
Potassium: 4.1 mmol/L (ref 3.5–5.1)
Sodium: 135 mmol/L (ref 135–145)

## 2019-12-11 LAB — HEPARIN LEVEL (UNFRACTIONATED): Heparin Unfractionated: <0.1 IU/mL — ABNORMAL LOW (ref 0.30–0.70)

## 2019-12-11 LAB — POCT ACTIVATED CLOTTING TIME
Activated Clotting Time: 263 seconds
Activated Clotting Time: 301 seconds
Activated Clotting Time: 367 seconds

## 2019-12-11 LAB — CBC
HCT: 30.9 % — ABNORMAL LOW (ref 36.0–46.0)
Hemoglobin: 10.1 g/dL — ABNORMAL LOW (ref 12.0–15.0)
MCH: 30 pg (ref 26.0–34.0)
MCHC: 32.7 g/dL (ref 30.0–36.0)
MCV: 91.7 fL (ref 80.0–100.0)
Platelets: 120 10*3/uL — ABNORMAL LOW (ref 150–400)
RBC: 3.37 MIL/uL — ABNORMAL LOW (ref 3.87–5.11)
RDW: 14.7 % (ref 11.5–15.5)
WBC: 16.8 10*3/uL — ABNORMAL HIGH (ref 4.0–10.5)
nRBC: 0 % (ref 0.0–0.2)

## 2019-12-11 MED ORDER — METOPROLOL SUCCINATE ER 50 MG PO TB24
50.0000 mg | ORAL_TABLET | Freq: Every day | ORAL | Status: DC
  Administered 2019-12-12 – 2019-12-13 (×2): 50 mg via ORAL
  Filled 2019-12-11 (×2): qty 1

## 2019-12-11 MED ORDER — HEPARIN (PORCINE) 25000 UT/250ML-% IV SOLN
950.0000 [IU]/h | INTRAVENOUS | Status: DC
  Administered 2019-12-11: 800 [IU]/h via INTRAVENOUS
  Filled 2019-12-11: qty 250

## 2019-12-11 NOTE — Progress Notes (Signed)
[facility]   Subjective  - feels a little better but still nausea   Objective (!) 103/57 (!) 120 98.8 F (37.1 C) (Oral) 20 95%  Intake/Output Summary (Last 24 hours) at 12/11/2019 0647 Last data filed at 12/11/2019 0964 Gross per 24 hour  Intake 2887.28 ml  Output --  Net 2887.28 ml   Abdomen soft mildly tender, apparently watery non bloody BM last night  Brisk biphasic doppler DP PT bilat left foot slightly cooler than right   ABI right 0.75, left 1.04 left ? Unreliable? Per report  CT abdomen pelvis suggests diffuse colitis, vessels open to femoral bifurcation bilaterally  Assessment/Planning: Bilateral lower extremity duplex today to check graft patency  Needs to get out of bed  GI will follow up today with colitis recs  Cardiology to see this morning for rate control spoke with their PA  Leukocytosis probably GI and acute phase  Consider heparin for afib tomorrow if hgb stable and no gi bleed   [name] 12/11/2019 6:47 AM --  Laboratory Lab Results: Recent Labs    12/10/19 0400 12/10/19 1009 12/10/19 1556 12/11/19 0024  WBC 17.5*  --   --  16.8*  HGB 11.1*   < > 11.6* 10.1*  HCT 33.3*   < > 36.2 30.9*  PLT 128*  --   --  120*   < > = values in this interval not displayed.   BMET Recent Labs    12/10/19 1556 12/11/19 0024  NA 138 135  K 4.3 4.1  CL 107 105  CO2 18* 23  GLUCOSE 149* 140*  BUN 25* 23  CREATININE 1.23* 1.32*  CALCIUM 7.6* 7.2*    COAG Lab Results  Component Value Date   INR 1.0 12/09/2019   INR 1.2 11/29/2019   INR 1.8 (A) 11/21/2019   No results found for: PTT

## 2019-12-11 NOTE — Telephone Encounter (Signed)
-----   Message from Timothy Lasso, RN sent at 12/11/2019  4:30 PM EDT ----- Regarding: FW: Follow-up  ----- Message ----- From: Irving Copas., MD Sent: 12/11/2019   4:27 PM EDT To: Timothy Lasso, RN Subject: Follow-up                                      Patty,This patient needs a follow-up with Dr. Silverio Decamp or one of the APP is in approximately 6 to 8 weeks for follow-up of ischemic colitis.Please forward this to her nurse to work on scheduling.Thanks.GM

## 2019-12-11 NOTE — Consult Note (Addendum)
Cardiology Consultation:   Patient ID: [name] [name]; [id number]; 12-09-1924   Admit date: 12/09/2019 Date of Consult: 12/11/2019  Primary Care Provider: Marin Olp, MD Primary Cardiologist: Dr. Oval Linsey, MD   Patient Profile:   [name] [name] is a 86 y.o. female with a hx of PAD s/p redo right fem-pop bypass grafting 12/09/19, HLD, HTN, COPD and persistent atrial fibrillation on PTA Coumadin who is being seen today for the evaluation of AF with RVR at the request of Dr. Oneida [name].   History of Present Illness:   Ms. [name] [name] is a 86yo F with a hx as stated above who presented for surgical intervention for redo right femoral to below-knee popliteal bypass with left common femoral endarterectomy patching for resting right foot pain and recurrent left common femoral stenosis performed 12/09/19 who then developed painless rectal bleeding 12/10/19 with GI consultation. Plan was to obtain further imaging with CT with contrast which showed acute colitis. As below, she has known AF and has since developed RVR, likely in the setting of acute illness and recent surgical intervention.   She was initially diagnosed with AF 11/27/19 incidentally during a presurgical risk assessment with Dr. Oval [name] prior to bypass surgery. She was started on Toprol-XL for rate control and was already on Coumadin therefore this was continued for a CHA2DS2VASc of 5. Abdominal aortogram with bilateral lower extremity runoff was performed 11/29/19 which showed a 90% stenosis left common femoral artery and plans were to pursue redo right femoral to below-knee popliteal bypass with PTFE on 12/09/19 with Dr. Oneida [name]. Prior to surgery she was seen in the AF Clinic 12/03/19 at which time she was continued on BB therapy and plan was to restart Coumadin in the post surgical setting.    She has no complaints of palpitations, SOB, LE edema or dizziness on exam. She denies chest pain or other anginal symptoms.   Past Medical History:    Diagnosis Date   Allergic rhinoconjunctivitis    Allergy    Anxiety    pt. managed- uses deep breathing    Arthritis    low back , stenosis   COLONIC POLYPS, RECURRENT 08/29/2006   2008 last colonoscopy. No further colonoscopy.      COPD (chronic obstructive pulmonary disease) (HCC)    Diverticulosis    Dysrhythmia    afib   Eczema    Environmental allergies    allergy shot- q friday in Dr. Janee Morn office. PFT's abnormal- recommended Spiriva to use preop & will d/c after surgery   GERD (gastroesophageal reflux disease)    Hiatal hernia    Hyperlipidemia    Hypertension    Lung nodule 2011   Paroxysmal atrial fibrillation ([place]) 11/27/2019   Peripheral vascular disease (HCC)    Shingles    Stenosis of popliteal artery (HCC)    blood clots in legs long ago     Trigeminal neuralgia    Left buttocks    Past Surgical History:  Procedure Laterality Date   ABDOMINAL AORTAGRAM N/A 06/17/2011   Procedure: ABDOMINAL Maxcine Ham;  Surgeon: Elam Dutch, MD;  Location: [facility] CATH LAB;  Service: Cardiovascular;  Laterality: N/A;   ABDOMINAL AORTOGRAM W/LOWER EXTREMITY Bilateral 06/22/2018   Procedure: ABDOMINAL AORTOGRAM W/LOWER EXTREMITY;  Surgeon: Elam Dutch, MD;  Location: [place] CV LAB;  Service: Cardiovascular;  Laterality: Bilateral;   ABDOMINAL AORTOGRAM W/LOWER EXTREMITY Bilateral 11/29/2019   Procedure: ABDOMINAL AORTOGRAM W/LOWER EXTREMITY;  Surgeon: Elam Dutch, MD;  Location: [facility] INVASIVE CV  LAB;  Service: Cardiovascular;  Laterality: Bilateral;   CATARACT EXTRACTION, BILATERAL     w IOL   CHOLECYSTECTOMY  1998   COLONOSCOPY     ENDARTERECTOMY FEMORAL Left 12/09/2019   Procedure: ENDARTERECTOMY FEMORAL with bovine patch angioplasty.;  Surgeon: Elam Dutch, MD;  Location: [place];  Service: Vascular;  Laterality: Left;   FEMORAL-POPLITEAL BYPASS GRAFT        x2 surgeries 1990's & 2009   FEMORAL-POPLITEAL BYPASS GRAFT Right 12/09/2019    Procedure: REDO RIGHT FEMORAL-POPLITEAL ARTERY BYPASS GRAFT;  Surgeon: Elam Dutch, MD;  Location: [place];  Service: Vascular;  Laterality: Right;   INJECTION KNEE Right Aug. 2016   Gel injection for pain   LUMBAR FUSION  07/06/2011   TONSILLECTOMY     as a teenager    TUBAL LIGATION       Prior to Admission medications   Medication Sig Start Date End Date Taking? Authorizing Provider  acetaminophen (TYLENOL) 500 MG tablet Take 1,000 mg by mouth 2 (two) times daily.    Yes [provider]  aspirin EC 81 MG tablet Take 81 mg by mouth every evening.    Yes [provider]  atorvastatin (LIPITOR) 40 MG tablet Take 1 tablet (40 mg total) by mouth daily. 12/07/18  Yes [name] Olp, MD  baclofen (LIORESAL) 10 MG tablet TAKE 1/2 (ONE-HALF) TABLET BY MOUTH ONCE DAILY NIGHTLY Patient taking differently: 5 mg 2 (two) times daily.  07/01/19  Yes Jamse Arn, MD  hydrochlorothiazide (HYDRODIURIL) 25 MG tablet TAKE 1 TABLET BY MOUTH AT BEDTIME 11/28/19  Yes [name] Olp, MD  irbesartan (AVAPRO) 300 MG tablet Take 1 tablet by mouth once daily 11/28/19  Yes [name] Olp, MD  lamoTRIgine (LAMICTAL) 100 MG tablet Take 1 tablet by mouth twice daily 11/08/19  Yes Jaffe, Adam R, DO  Menthol (ICY HOT) 5 % PTCH Apply 1 patch topically daily as needed (back pain).   Yes [provider]  metoprolol succinate (TOPROL XL) 25 MG 24 hr tablet Take 1 tablet (25 mg total) by mouth daily. 11/27/19  Yes Skeet Latch, MD  Multiple Vitamin (MULTIVITAMIN WITH MINERALS) TABS tablet Take 1 tablet by mouth daily.   Yes [provider]  pantoprazole (PROTONIX) 40 MG tablet Take 1 tablet (40 mg total) by mouth 2 (two) times daily. 12/02/19  Yes Kozlow, Donnamarie Poag, MD  RESTASIS 0.05 % ophthalmic emulsion Place 1 drop into both eyes 2 (two) times daily as needed (dry eyes).  01/31/18  Yes [provider]  colchicine (COLCRYS) 0.6 MG tablet Take 2 pills at first sign  gout flare, then another pill 2 hours later if still having pain. Then take once daily until pain resolves. 12/31/15   [name] Olp, MD  warfarin (COUMADIN) 5 MG tablet TAKE 1 TABLET BY MOUTH ONCE DAILY, EXCEPT TAKE 1 & 1/2 TABLETS ON MONDAY OR TAKE AS DIRECTED BY ANTICOAGULATION CLINIC Patient taking differently: Take 5 mg by mouth every evening.  09/03/19   [name] Olp, MD    Inpatient Medications: Scheduled Meds:  sodium chloride   Intravenous Once   aspirin EC  81 mg Oral QPM   atorvastatin  40 mg Oral q1800   baclofen  5 mg Oral BID   Chlorhexidine Gluconate Cloth  6 each Topical Q0600   docusate sodium  100 mg Oral Daily   irbesartan  300 mg Oral Daily   lamoTRIgine  100 mg Oral BID  metoprolol succinate  25 mg Oral Daily   mupirocin ointment  1 application Nasal BID   pantoprazole  40 mg Oral BID   Continuous Infusions:  sodium chloride 100 mL/hr at 12/11/19 0622    ceFAZolin (ANCEF) IV 2 g (12/11/19 0631)   diltiazem (CARDIZEM) infusion 5 mg/hr (12/09/19 1801)   magnesium sulfate bolus IVPB     metronidazole Stopped (12/11/19 0541)   PRN Meds: acetaminophen **OR** acetaminophen, alum & mag hydroxide-simeth, bisacodyl, guaiFENesin-dextromethorphan, hydrALAZINE, labetalol, magnesium sulfate bolus IVPB, morphine injection, ondansetron, oxyCODONE-acetaminophen, phenol, potassium chloride, senna-docusate  Allergies:    Allergies  Allergen Reactions   Sulfa Antibiotics Other (See Comments)    Cold sweat light headed and disorientation   Tiotropium Bromide Shortness Of Breath and Other (See Comments)    Sore throat also   Latex Itching and Rash   Ultram [Tramadol] Nausea And Vomiting   5-Alpha Reductase Inhibitors     Social History:   Social History   Socioeconomic History   Marital status: Single    Spouse name: Not on file   Number of children: 4   Years of education: 12   Highest education level: Not on file  Occupational  History   Occupation: retired    Fish farm manager: RETIRED  Tobacco Use   Smoking status: Former Smoker    Packs/day: 2.00    Years: 30.00    Pack years: 60.00    Types: Cigarettes    Quit date: 06/18/1993    Years since quitting: 26.4   Smokeless tobacco: Never Used   Tobacco comment: QUIT IN 1995  Vaping Use   Vaping Use: Never used  Substance and Sexual Activity   Alcohol use: No    Alcohol/week: 0.0 standard drinks   Drug use: No   Sexual activity: Not on file  Other Topics Concern   Not on file  Social History Narrative   Patient is widowed with 4 children. 2 grandkids. Lives alone.    Patient is right handed.   Patient has high school education.   Patient drinks 1 cup daily.      Retired from [employer] for 28 years, works 2 days a week until 2016.       Hobbies: volunteers at [employer], [place] from church visits people.       No caffeine.    Social Determinants of Health   Financial Resource Strain:    Difficulty of Paying Living Expenses:   Food Insecurity:    Worried About Charity fundraiser in the Last Year:    Arboriculturist in the Last Year:   Transportation Needs:    Film/video editor (Medical):    Lack of Transportation (Non-Medical):   Physical Activity:    Days of Exercise per Week:    Minutes of Exercise per Session:   Stress:    Feeling of Stress :   Social Connections:    Frequency of Communication with Friends and Family:    Frequency of Social Gatherings with Friends and Family:    Attends Religious Services:    Active Member of Clubs or Organizations:    Attends Music therapist:    Marital Status:   Intimate Partner Violence:    Fear of Current or Ex-Partner:    Emotionally Abused:    Physically Abused:    Sexually Abused:     Family History:   Family History  Problem Relation Age of Onset   Arthritis Mother  Hypertension Mother    Heart disease Father    Colon  polyps Father    Breast cancer Sister    Breast cancer Daughter    Hypertension Son    Lung cancer Brother    Colon cancer Sister    Brain cancer Sister    Lung cancer Sister    Anesthesia problems Neg Hx    Hypotension Neg Hx    Malignant hyperthermia Neg Hx    Pseudochol deficiency Neg Hx    Family Status:  Family Status  Relation Name Status   Mother  Deceased at age 93   Father  Deceased at age 11   Sister  Alive   Daughter  Alive   Son  Web designer  (Not Specified)   Sister  (Not Specified)   Sister  (Not Specified)   Daughter  Alive   Daughter  Alive   Neg Hx  (Not Specified)    ROS:  Please see the history of present illness.  All other ROS reviewed and negative.     Physical Exam/Data:   Vitals:   12/11/19 0042 12/11/19 0201 12/11/19 0400 12/11/19 0800  BP: 133/70 127/62 (!) 103/57 (!) 113/58  Pulse:  (!) 120  (!) 129  Resp: (!) 21 (!) 23 20 (!) 24  Temp:   98.8 F (37.1 C) 98.2 F (36.8 C)  TempSrc:   Oral Oral  SpO2: 95% 95% 95% 96%  Weight:      Height:        Intake/Output Summary (Last 24 hours) at 12/11/2019 1000 Last data filed at 12/11/2019 0622 Gross per 24 hour  Intake 2887.28 ml  Output --  Net 2887.28 ml   Filed Weights   12/09/19 0637  Weight: 74.5 kg   Body mass index is 33.16 kg/m.   General: Overweight, NAD Neck: Negative for carotid bruits. No JVD Lungs: Diminished in bilateral upper and lower lobes. Breathing is mildly labored. Cardiovascular: Irregularly irregular with S1 S2. Abdomen: Soft, non-tender, distended. No obvious abdominal masses. Extremities: No edema. Radial pulses 2+ bilaterally, pedal pulses 1+ bilaterally  Neuro: Alert and oriented. No focal deficits. No facial asymmetry. MAE spontaneously. Psych: Responds to questions appropriately with normal affect.    EKG:  The EKG was personally reviewed and demonstrates: 12/09/19 AFwith HR 87bpm  Telemetry:  Telemetry was personally  reviewed and demonstrates: 12/11/19 AF with RVR with rates mainly in the low 100's   Relevant CV Studies:  Echo 11/27/19:  1. Left ventricular ejection fraction, by estimation, is 60 to 65%. The  left ventricle has normal function. The left ventricle has no regional  wall motion abnormalities. The left ventricular internal cavity size was  mildly dilated. Left ventricular  diastolic function could not be evaluated.  2. Right ventricular systolic function is normal. The right ventricular  size is normal. There is mildly elevated pulmonary artery systolic  pressure.  3. Left atrial size was mildly dilated.  4. The mitral valve is normal in structure. No evidence of mitral valve  regurgitation. No evidence of mitral stenosis.  5. The aortic valve is grossly normal. Aortic valve regurgitation is not  visualized. No aortic stenosis is present.   Laboratory Data:  Chemistry Recent Labs  Lab 12/09/19 2033 12/10/19 1556 12/11/19 0024  NA 138 138 135  K 4.5 4.3 4.1  CL 108 107 105  CO2 20* 18* 23  GLUCOSE 172* 149* 140*  BUN 22 25* 23  CREATININE 1.18* 1.23* 1.32*  CALCIUM 7.9* 7.6* 7.2*  GFRNONAA 40* 38* 35*  GFRAA 46* 44* 40*  ANIONGAP 10 13 7     Total Protein  Date Value Ref Range Status  12/10/2019 4.7 (L) 6.5 - 8.1 g/dL Final  11/27/2019 6.5 6.0 - 8.5 g/dL Final   Albumin  Date Value Ref Range Status  12/10/2019 2.8 (L) 3.5 - 5.0 g/dL Final  11/27/2019 4.5 3.5 - 4.6 g/dL Final   AST  Date Value Ref Range Status  12/10/2019 22 15 - 41 U/L Final   ALT  Date Value Ref Range Status  12/10/2019 9 0 - 44 U/L Final   Alkaline Phosphatase  Date Value Ref Range Status  12/10/2019 43 38 - 126 U/L Final   Total Bilirubin  Date Value Ref Range Status  12/10/2019 0.7 0.3 - 1.2 mg/dL Final   Bilirubin Total  Date Value Ref Range Status  11/27/2019 0.4 0.0 - 1.2 mg/dL Final   Hematology Recent Labs  Lab 12/09/19 2033 12/09/19 2033 12/10/19 0400 12/10/19 0400  12/10/19 1009 12/10/19 1556 12/11/19 0024  WBC 18.1*  --  17.5*  --   --   --  16.8*  RBC 3.94  --  3.74*  --   --   --  3.37*  HGB 11.6*   < > 11.1*   < > 11.4* 11.6* 10.1*  HCT 34.8*   < > 33.3*   < > 33.4* 36.2 30.9*  MCV 88.3  --  89.0  --   --   --  91.7  MCH 29.4  --  29.7  --   --   --  30.0  MCHC 33.3  --  33.3  --   --   --  32.7  RDW 13.9  --  14.4  --   --   --  14.7  PLT 135*  --  128*  --   --   --  120*   < > = values in this interval not displayed.   Cardiac EnzymesNo results for input(s): TROPONINI in the last 168 hours. No results for input(s): TROPIPOC in the last 168 hours.  BNPNo results for input(s): BNP, PROBNP in the last 168 hours.  DDimer No results for input(s): DDIMER in the last 168 hours. TSH:  Lab Results  Component Value Date   TSH 3.190 11/27/2019   Lipids: Lab Results  Component Value Date   CHOL 77 12/10/2019   HDL 30 (L) 12/10/2019   LDLCALC 32 12/10/2019   LDLDIRECT 46.0 06/10/2019   TRIG 74 12/10/2019   CHOLHDL 2.6 12/10/2019   HgbA1c: Lab Results  Component Value Date   HGBA1C 6.2 06/10/2019    Radiology/Studies:  CT ABDOMEN PELVIS W CONTRAST  Result Date: 12/10/2019 CLINICAL DATA:  86 year old female with history of acute abdominal pain. EXAM: CT ABDOMEN AND PELVIS WITH CONTRAST TECHNIQUE: Multidetector CT imaging of the abdomen and pelvis was performed using the standard protocol following bolus administration of intravenous contrast. CONTRAST:  60mL OMNIPAQUE IOHEXOL 300 MG/ML  SOLN COMPARISON:  No prior CT the abdomen and pelvis. Chest CT 10/01/2012. FINDINGS: Lower chest: Pulmonary nodules noted in the right lung, stable dating back to 2014, considered definitively benign. Atherosclerotic calcifications in the descending thoracic aorta as well as the left main, left anterior descending, left circumflex and right coronary arteries. Mild thickening calcification of the aortic valve. Severe calcifications of the mitral annulus.  Hepatobiliary: No discrete cystic or solid hepatic lesions. No intra or extrahepatic biliary ductal dilatation.  Status post cholecystectomy. Pancreas: No pancreatic mass. No pancreatic ductal dilatation. No pancreatic or peripancreatic fluid collections or inflammatory changes. Spleen: Unremarkable. Adrenals/Urinary Tract: Multiple low-attenuation lesions are noted in both kidneys compatible with simple cysts, largest of which is in the upper pole the left kidney measuring 8.2 x 7.5 cm. In addition, in the lower pole of the left kidney (axial image 45 of series 3 and coronal image 49 of series 7) there is a 3.0 x 2.3 x 3.1 cm low-attenuation lesion which has some internal septations which appear to demonstrates some low-level internal enhancement. Multiple other subcentimeter low-attenuation lesions in both kidneys are too small to definitively characterize, but are statistically likely to represent tiny cysts. No hydroureteronephrosis. Urinary bladder is remarkable for a small amount of gas non dependently within the lumen. Urinary bladder is otherwise unremarkable. Bilateral adrenal glands are normal in appearance. Stomach/Bowel: The appearance of the stomach is normal. No pathologic dilatation of small bowel or colon. Extensive mural thickening and mucosal hyperenhancement is noted in the colon, involving the distal transverse colon, splenic flexure, descending colon and sigmoid colon, concerning for colitis. Several colonic diverticulae are also noted, without discrete surrounding inflammatory changes to clearly indicate an acute diverticulitis at this time. Normal appendix. Vascular/Lymphatic: Aortic atherosclerosis, without evidence of aneurysm or dissection in the abdominal or pelvic vasculature. No lymphadenopathy noted in the abdomen or pelvis. Reproductive: Densely calcified lesion measuring 2.4 cm in the right-side of the uterine body, presumably a chronic calcified fibroid. Ovaries are unremarkable in  appearance. Other: Trace volume of ascites.  No pneumoperitoneum. Musculoskeletal: There are no aggressive appearing lytic or blastic lesions noted in the visualized portions of the skeleton. Multiple old healed posterior left-sided rib fractures. Status post PLIF at L4-L5 with interbody graft at L4-L5 interspace. IMPRESSION: 1. Imaging findings of acute colitis involving the distal transverse colon, splenic flexure, descending colon and sigmoid colon. 2. Colonic diverticulosis without definitive evidence to suggest an acute diverticulitis at this time. 3. Gas non dependently in the lumen of the urinary bladder. If there has been recent catheterization for urinalysis, this is presumably iatrogenic. In the absence of a catheterization, further evaluation with urinalysis would be recommended to exclude the possibility of infection with gas-forming organisms. 4. Multiple low-attenuation lesions in the kidneys bilaterally, most of which appear to represent simple cysts. In addition, however, there is an indeterminate lesion in the lower pole of the left kidney which warrants further evaluation. Follow-up evaluation with nonemergent abdominal MRI with and without IV gadolinium is recommended in the near future to exclude neoplasm. 5. Aortic atherosclerosis, in addition to left main and 3 vessel coronary artery disease. 6. There are calcifications of the aortic valve and mitral annulus. Echocardiographic correlation for evaluation of potential valvular dysfunction may be warranted if clinically indicated. 7. Additional incidental findings, as above. Electronically Signed   By: [name] M.D.   On: 12/10/2019 15:03   VAS [place] ABI WITH/WO TBI  Result Date: 12/11/2019 LOWER EXTREMITY DOPPLER STUDY Indications: Post Op.  Vascular Interventions: Right Fem-pop bypass, Left Fem endart. Limitations: Today's exam was limited due to difficult evaluation. Performing Technologist: Lita Mains RDMS, RVT  Examination  Guidelines: A complete evaluation includes at minimum, Doppler waveform signals and systolic blood pressure reading at the level of bilateral brachial, anterior tibial, and posterior tibial arteries, when vessel segments are accessible. Bilateral testing is considered an integral part of a complete examination. Photoelectric Plethysmograph (PPG) waveforms and toe systolic pressure readings are included as required  and additional duplex testing as needed. Limited examinations for reoccurring indications may be performed as noted.  ABI Findings: +---------+------------------+-----+--------+---------------+  Right     Rt Pressure (mmHg) Index Waveform Comment          +---------+------------------+-----+--------+---------------+  Brachial  110                                                +---------+------------------+-----+--------+---------------+  PTA       255                2.32           noncompressible  +---------+------------------+-----+--------+---------------+  DP        82                 0.75                            +---------+------------------+-----+--------+---------------+  Great Toe 43                 0.39  Normal                    +---------+------------------+-----+--------+---------------+ +---------+------------------+-----+--------+----------------------------------+  Left      Lt Pressure (mmHg) Index Waveform Comment                             +---------+------------------+-----+--------+----------------------------------+  Brachial                                    unable to obtain, per RN 107        +---------+------------------+-----+--------+----------------------------------+  PTA       84                 0.76           unreliable, audible only, no                                                     cooresponding image                 +---------+------------------+-----+--------+----------------------------------+  DP        114                1.04           unreliable, audible  only, no                                                     cooresponding image                 +---------+------------------+-----+--------+----------------------------------+  Great Toe 16                 0.15  Abnormal                                     +---------+------------------+-----+--------+----------------------------------+ +-------+-----------+-----------+------------+------------+  ABI/TBI Today's ABI Today's TBI Previous ABI Previous TBI  +-------+-----------+-----------+------------+------------+  Right   .75                     .34          0             +-------+-----------+-----------+------------+------------+  Left    1.0                     .51                        +-------+-----------+-----------+------------+------------+ TOES Findings: +----------+---------------+--------+-------+  Right Toes Pressure (mmHg) Waveform Comment  +----------+---------------+--------+-------+  1st Digit  43                                +----------+---------------+--------+-------+  +---------+---------------+--------+-------+  Left Toes Pressure (mmHg) Waveform Comment  +---------+---------------+--------+-------+  1st Digit 16                                +---------+---------------+--------+-------+   Right ABIs appear increased compared to prior study on 11/18/19.  Summary: Right: Resting right ankle-brachial index indicates moderate right lower extremity arterial disease. The right toe-brachial index is normal. Left: The left toe-brachial index is abnormal. Difficult evaluation of left lower extremity. Audible signal, unable to document (especially PT).  *See table(s) above for measurements and observations.  Electronically signed by Deitra Mayo MD on 12/11/2019 at 5:28:18 AM.    Final    Assessment and Plan:   1. Persistent atrial fibrillation with RVR: -Initially diagnosed with AF 11/27/19 incidentally during a presurgical risk assessment with Dr. Oval [name] prior to bypass surgery and was  started on Toprol-XL for rate control. She was already on Coumadin after previous thrombolysis treatment therefore this was continued for a CHA2DS2VASc of 5.  -Rates are not optimally controlled at this time therefore will hold Avapro increase Toprol to 50mg  PO QD and follow response closely  -Per [facility] note, okay to styart IV Heparin infusion for AF if no GI bleed and stable Hb>>>will check with GI prior to initiation  -Hb 11.4>11.6>10.1  2. PAD s/p redo right fem-pop bypass grafting 12/09/19: -Management per [facility] -Continue ASA, atorvastatin   3. Painless rectal bleeding: -GI consulted with plans was to obtain further imaging with CT with contrast which showed diffuse colitis -Management per GI  -Hb 11.4>11.6>10.1 -As above, will discuss AC for AF with MD  4. HTN: -Soft, 113/58>103/57>127/62>133/70 -Will plan to hold ARB and increase Toprol for greater rate control   For questions or updates, please contact [place] Please consult [email or web address] for contact info under Cardiology/STEMI.   Lyndel Safe NP-C [facility] Pager: [phone] 12/11/2019 10:00 AM  Patient seen and examined.  Agree with above documentation.  Ms. Bicking is a 86 year old female with a history of PAD who underwent redo right femoropopliteal bypass on 12/09/2019, persistent atrial fibrillation on warfarin, COPD, hypertension who we are consulted to see by Dr. Oneida [name] for evaluation of atrial fibrillation with RVR.  Patient was diagnosed with atrial fibrillation during preop risk assessment with Dr. Oval [name] on 11/27/2019.  She was already taking Coumadin, and Toprol-XL was added at that time.  Abdominal aortogram on 11/29/2019 showed 90% left common femoral artery stenosis.  On 12/09/2019 she underwent redo right femoral-popliteal bypass.  Postop course  has been complicated by GI bleed for which gastroenterology has been consulted.  Abdominal CT showed acute colitis.  Patient reports bleeding has resolved.  Postop  course has also been complicated by atrial fibrillation with RVR.  She denies any symptoms from atrial fibrillation, specifically denies any chest pain, dyspnea, or palpitations.  On exam, patient is alert and oriented, irregular, tachycardic, no murmurs, lungs CTAB, no LE edema or JVD.  EKG shows atrial fibrillation, rate 87, low voltage, no ST abnormalities.  Echocardiogram from 11/27/2019 shows normal biventricular function, no significant valvular disease.  Telemetry shows atrial fibrillation with rates 90s to 120s.  For her atrial fibrillation, rates appear relatively controlled though does have episodes of RVR.  Rate control is limited by soft BP.  Recommend discontinuing irbesartan to allow more room to titrate Toprol-XL.  Will increase Toprol-XL to 50 mg daily.  Will discuss with GI, and if okay from their perspective, will restart heparin drip.  Donato Heinz, MD

## 2019-12-11 NOTE — Progress Notes (Signed)
IP rehab admissions:  I met with patient and her son.  They are in agreement and now would like CIR admission.  I will follow along for medical readiness.  I do plan to have CIR bed available tomorrow.  Call me for questions.  [phone]

## 2019-12-11 NOTE — Progress Notes (Signed)
Mobility Specialist - Progress Note   12/11/19 1507  Mobility  Activity Dangled on edge of bed  Level of Assistance Maximum assist, patient does 25-49%  Assistive Device None  Mobility Response Tolerated well  Mobility performed by Mobility specialist  $Mobility charge 1 Mobility    Pre-mobility, laying: 101 HR, 87/50 BP, 93% SpO2 During mobility, sitting up: 125 HR, 106/49 BP, 93% SpO2 Post-mobility, laying : 119 HR, 86/70 BP, 97% SpO2  Pt required max assist in sitting up, primarily to support her trunk. Her son provided +2 assistance in order to lay her back down.   Pricilla Handler Mobility Specialist Mobility Specialist Phone: [phone]

## 2019-12-11 NOTE — Evaluation (Signed)
Physical Therapy Evaluation Patient Details Name: [name] [name] MRN: [id number] DOB: Oct 05, 1924 Today's Date: 12/11/2019   History of Present Illness  Pt is a 86 y.o. F with significant PMH of HTN, paroxysmal atrial fibrillation, PVD, prior right femoropopliteal bypass who presents with redo right femoropopliteal bypass and left common femoral endarterectomy 12/09/2019. CT showing findings of acute colitis.   Clinical Impression  Prior to admission, pt lives alone, is independent with ADL's/IADL's and uses a cane for mobility. On PT evaluation, pt presents with decreased functional mobility in setting of post surgical pain, weakness, balance deficits and decreased activity tolerance. Pt requiring two person moderate assist for mobility. Able to take pivotal steps from bed to chair with a walker. Of note, pt in afib with irregular HR ranging from 112-167 bpm (RN aware). BP stable (see below). Recommending CIR and suspect good progress given motivation, PLOF and family support.   Vitals (BP): Supine: 113/58 Sitting: 124/71 Sitting post mobility: 136/81    Follow Up Recommendations CIR    Equipment Recommendations  Rolling walker with 5" wheels;3in1 (PT);Wheelchair (measurements PT);Wheelchair cushion (measurements PT)    Recommendations for Other Services       Precautions / Restrictions Precautions Precautions: Fall;Other (comment) Precaution Comments: afib Restrictions Weight Bearing Restrictions: No      Mobility  Bed Mobility Overal bed mobility: Needs Assistance Bed Mobility: Supine to Sit     Supine to sit: Mod assist;+2 for physical assistance     General bed mobility comments: Cues for initiation of BLE movement, use of rail, modA + 2 for assist to complete BLE's off edge of bed and trunk to upright positioning  Transfers Overall transfer level: Needs assistance Equipment used: Rolling walker (2 wheeled) Transfers: Sit to/from Stand Sit to Stand: Mod assist;+2  physical assistance         General transfer comment: ModA + 2 to rise from edge of bed, initial retropulsion. Cues for appropriate grip on walker handles.  Ambulation/Gait Ambulation/Gait assistance: Mod assist;+2 physical assistance Gait Distance (Feet): 3 Feet Assistive device: Rolling walker (2 wheeled) Gait Pattern/deviations: Step-to pattern;Decreased stride length Gait velocity: decreased Gait velocity interpretation: <1.31 ft/sec, indicative of household ambulator General Gait Details: Pt with pivotal steps from bed to chair, modA + 2 for stability. Max cues for stepping initiation, anterior weight shift, sequencing/direction.   Stairs            Wheelchair Mobility    Modified Rankin (Stroke Patients Only)       Balance Overall balance assessment: Needs assistance Sitting-balance support: Feet unsupported Sitting balance-Leahy Scale: Poor Sitting balance - Comments: Requiring close min guard - min assist for safety. Improvement with foot support Postural control: Right lateral lean Standing balance support: Bilateral upper extremity supported Standing balance-Leahy Scale: Poor Standing balance comment: reliant on external support, posterior lean noted                             Pertinent Vitals/Pain Pain Assessment: Faces Faces Pain Scale: Hurts even more Pain Location: incisional sites Pain Descriptors / Indicators: Grimacing;Guarding Pain Intervention(s): Monitored during session;Limited activity within patient's tolerance    Home Living Family/patient expects to be discharged to:: Private residence Living Arrangements: Alone Available Help at Discharge: Family;Available PRN/intermittently Type of Home: House Home Access: Stairs to enter Entrance Stairs-Rails: Psychiatric nurse of Steps: 2 ("a couple") Home Layout: One level Home Equipment: Cane - single point;Walker - 4 wheels;Shower seat;Grab bars -  tub/shower;Hand  held shower head      Prior Function Level of Independence: Independent with assistive device(s)         Comments: Using cane     Hand Dominance        Extremity/Trunk Assessment   Upper Extremity Assessment Upper Extremity Assessment: Defer to OT evaluation    Lower Extremity Assessment Lower Extremity Assessment: Generalized weakness       Communication   Communication: No difficulties  Cognition Arousal/Alertness: Awake/alert Behavior During Therapy: WFL for tasks assessed/performed Overall Cognitive Status: Impaired/Different from baseline Area of Impairment: Problem solving                             Problem Solving: Slow processing;Decreased initiation;Requires verbal cues;Requires tactile cues General Comments: Pt with slower processing noted, requiring cues for problem solving. Likely internally distracted by pain or due to medications      General Comments      Exercises Other Exercises Other Exercises: Encouraged digit flexion/extension for edema and ankle pumps   Assessment/Plan    PT Assessment Patient needs continued PT services  PT Problem List Decreased strength;Decreased activity tolerance;Decreased balance;Decreased mobility;Pain       PT Treatment Interventions DME instruction;Gait training;Functional mobility training;Therapeutic activities;Therapeutic exercise;Stair training;Balance training;Patient/family education    PT Goals (Current goals can be found in the Care Plan section)  Acute Rehab PT Goals Patient Stated Goal: get out of bed PT Goal Formulation: With patient/family Time For Goal Achievement: 12/25/19 Potential to Achieve Goals: Good    Frequency Min 3X/week   Barriers to discharge        Co-evaluation PT/OT/SLP Co-Evaluation/Treatment: Yes Reason for Co-Treatment: For patient/therapist safety;To address functional/ADL transfers PT goals addressed during session: Mobility/safety with mobility          AM-PAC PT "6 Clicks" Mobility  Outcome Measure Help needed turning from your back to your side while in a flat bed without using bedrails?: A Little Help needed moving from lying on your back to sitting on the side of a flat bed without using bedrails?: A Lot Help needed moving to and from a bed to a chair (including a wheelchair)?: A Lot Help needed standing up from a chair using your arms (e.g., wheelchair or bedside chair)?: A Lot Help needed to walk in hospital room?: A Lot Help needed climbing 3-5 steps with a railing? : Total 6 Click Score: 12    End of Session Equipment Utilized During Treatment: Gait belt Activity Tolerance: Patient tolerated treatment well Patient left: in chair;with call bell/phone within reach;with chair alarm set;with family/visitor present Nurse Communication: Mobility status PT Visit Diagnosis: Pain;Difficulty in walking, not elsewhere classified (R26.2);Unsteadiness on feet (R26.81) Pain - part of body:  (groin)    Time: 8115-7262 PT Time Calculation (min) (ACUTE ONLY): 29 min   Charges:   PT Evaluation $PT Eval Moderate Complexity: 1 Mod            Wyona Almas, PT, DPT Acute Rehabilitation Services Pager [phone] Office [phone]   Deno Etienne 12/11/2019, 9:27 AM

## 2019-12-11 NOTE — Progress Notes (Addendum)
Daily Rounding Note  12/11/2019, 10:23 AM  LOS: 2 days   SUBJECTIVE:   Chief complaint: Abdominal pain, bloody stools, CT confirms ischemic colitis.     Much less abdominal pain today.  Some nausea which she says she gets when she has not eaten.  She has not eaten since Saturday.  In addition to stools earlier yesterday morning, she had 3 overnight and the latest of these was not overtly bloody. Overall she is feeling a lot better.  Denies shortness of breath.  Denies pain in her groins.  OBJECTIVE:         Vital signs in last 24 hours:    Temp:  [97.7 F (36.5 C)-99.4 F (37.4 C)] 98.2 F (36.8 C) (08/18 0800) Pulse Rate:  [106-137] 129 (08/18 0800) Resp:  [11-56] 24 (08/18 0800) BP: (90-133)/(50-118) 113/58 (08/18 0800) SpO2:  [95 %-99 %] 96 % (08/18 0800) Last BM Date: 12/10/19 Filed Weights   12/09/19 0637  Weight: 74.5 kg   General: Patient sitting in bedside chair, comfortable, alert.  Does not look ill Heart: Irregularly irregular, rate 116. Chest: Clear bilaterally without labored breathing or cough Abdomen: Soft.  Active bowel sounds.  Not tender or distended. Only small amount of bruising evident in the groin, 3 stapled incision sites are CDI. Extremities: No CCE. Neuro/Psych: Alert.  Appropriate.  No gross deficits.  Calm.    Intake/Output from previous day: 08/17 0701 - 08/18 0700 In: 2887.3 [P.O.:250; I.V.:2231.4; IV Piggyback:405.9] Out: -   Intake/Output this shift: No intake/output data recorded.  Lab Results: Recent Labs    12/09/19 2033 12/09/19 2033 12/10/19 0400 12/10/19 0400 12/10/19 1009 12/10/19 1556 12/11/19 0024  WBC 18.1*  --  17.5*  --   --   --  16.8*  HGB 11.6*   < > 11.1*   < > 11.4* 11.6* 10.1*  HCT 34.8*   < > 33.3*   < > 33.4* 36.2 30.9*  PLT 135*  --  128*  --   --   --  120*   < > = values in this interval not displayed.   BMET Recent Labs    12/09/19 2033  12/10/19 1556 12/11/19 0024  NA 138 138 135  K 4.5 4.3 4.1  CL 108 107 105  CO2 20* 18* 23  GLUCOSE 172* 149* 140*  BUN 22 25* 23  CREATININE 1.18* 1.23* 1.32*  CALCIUM 7.9* 7.6* 7.2*   LFT Recent Labs    12/09/19 0637 12/09/19 2033 12/10/19 1556  PROT 6.1* 4.5* 4.7*  ALBUMIN 3.9 2.9* 2.8*  AST 18 21 22   ALT 12 14 9   ALKPHOS 87 44 43  BILITOT 0.6 0.6 0.7   PT/INR Recent Labs    12/09/19 0637  LABPROT 12.4  INR 1.0   Hepatitis Panel No results for input(s): HEPBSAG, HCVAB, HEPAIGM, HEPBIGM in the last 72 hours.  Studies/Results: CT ABDOMEN PELVIS W CONTRAST  Result Date: 12/10/2019 CLINICAL DATA:  86 year old female with history of acute abdominal pain. EXAM: CT ABDOMEN AND PELVIS WITH CONTRAST TECHNIQUE: Multidetector CT imaging of the abdomen and pelvis was performed using the standard protocol following bolus administration of intravenous contrast. CONTRAST:  45mL OMNIPAQUE IOHEXOL 300 MG/ML  SOLN COMPARISON:  No prior CT the abdomen and pelvis. Chest CT 10/01/2012. FINDINGS: Lower chest: Pulmonary nodules noted in the right lung, stable dating back to 2014, considered definitively benign. Atherosclerotic calcifications in the descending thoracic aorta as well  as the left main, left anterior descending, left circumflex and right coronary arteries. Mild thickening calcification of the aortic valve. Severe calcifications of the mitral annulus. Hepatobiliary: No discrete cystic or solid hepatic lesions. No intra or extrahepatic biliary ductal dilatation. Status post cholecystectomy. Pancreas: No pancreatic mass. No pancreatic ductal dilatation. No pancreatic or peripancreatic fluid collections or inflammatory changes. Spleen: Unremarkable. Adrenals/Urinary Tract: Multiple low-attenuation lesions are noted in both kidneys compatible with simple cysts, largest of which is in the upper pole the left kidney measuring 8.2 x 7.5 cm. In addition, in the lower pole of the left kidney  (axial image 45 of series 3 and coronal image 49 of series 7) there is a 3.0 x 2.3 x 3.1 cm low-attenuation lesion which has some internal septations which appear to demonstrates some low-level internal enhancement. Multiple other subcentimeter low-attenuation lesions in both kidneys are too small to definitively characterize, but are statistically likely to represent tiny cysts. No hydroureteronephrosis. Urinary bladder is remarkable for a small amount of gas non dependently within the lumen. Urinary bladder is otherwise unremarkable. Bilateral adrenal glands are normal in appearance. Stomach/Bowel: The appearance of the stomach is normal. No pathologic dilatation of small bowel or colon. Extensive mural thickening and mucosal hyperenhancement is noted in the colon, involving the distal transverse colon, splenic flexure, descending colon and sigmoid colon, concerning for colitis. Several colonic diverticulae are also noted, without discrete surrounding inflammatory changes to clearly indicate an acute diverticulitis at this time. Normal appendix. Vascular/Lymphatic: Aortic atherosclerosis, without evidence of aneurysm or dissection in the abdominal or pelvic vasculature. No lymphadenopathy noted in the abdomen or pelvis. Reproductive: Densely calcified lesion measuring 2.4 cm in the right-side of the uterine body, presumably a chronic calcified fibroid. Ovaries are unremarkable in appearance. Other: Trace volume of ascites.  No pneumoperitoneum. Musculoskeletal: There are no aggressive appearing lytic or blastic lesions noted in the visualized portions of the skeleton. Multiple old healed posterior left-sided rib fractures. Status post PLIF at L4-L5 with interbody graft at L4-L5 interspace. IMPRESSION: 1. Imaging findings of acute colitis involving the distal transverse colon, splenic flexure, descending colon and sigmoid colon. 2. Colonic diverticulosis without definitive evidence to suggest an acute  diverticulitis at this time. 3. Gas non dependently in the lumen of the urinary bladder. If there has been recent catheterization for urinalysis, this is presumably iatrogenic. In the absence of a catheterization, further evaluation with urinalysis would be recommended to exclude the possibility of infection with gas-forming organisms. 4. Multiple low-attenuation lesions in the kidneys bilaterally, most of which appear to represent simple cysts. In addition, however, there is an indeterminate lesion in the lower pole of the left kidney which warrants further evaluation. Follow-up evaluation with nonemergent abdominal MRI with and without IV gadolinium is recommended in the near future to exclude neoplasm. 5. Aortic atherosclerosis, in addition to left main and 3 vessel coronary artery disease. 6. There are calcifications of the aortic valve and mitral annulus. Echocardiographic correlation for evaluation of potential valvular dysfunction may be warranted if clinically indicated. 7. Additional incidental findings, as above. Electronically Signed   By: [name] M.D.   On: 12/10/2019 15:03   VAS [place] ABI WITH/WO TBI  Result Date: 12/11/2019 LOWER EXTREMITY DOPPLER STUDY Summary: Right: Resting right ankle-brachial index indicates moderate right lower extremity arterial disease. The right toe-brachial index is normal. Left: The left toe-brachial index is abnormal. Difficult evaluation of left lower extremity. Audible signal, unable to document (especially PT).  *See table(s) above for  measurements and observations.  Electronically signed by Deitra Mayo MD on 12/11/2019 at 5:28:18 AM.    Final    Scheduled Meds: . sodium chloride   Intravenous Once  . aspirin EC  81 mg Oral QPM  . atorvastatin  40 mg Oral q1800  . baclofen  5 mg Oral BID  . Chlorhexidine Gluconate Cloth  6 each Topical Q0600  . docusate sodium  100 mg Oral Daily  . irbesartan  300 mg Oral Daily  . lamoTRIgine  100 mg Oral BID    . metoprolol succinate  25 mg Oral Daily  . mupirocin ointment  1 application Nasal BID  . pantoprazole  40 mg Oral BID   Continuous Infusions: . sodium chloride 100 mL/hr at 12/11/19 0622  .  ceFAZolin (ANCEF) IV 2 g (12/11/19 0631)  . diltiazem (CARDIZEM) infusion 5 mg/hr (12/09/19 1801)  . magnesium sulfate bolus IVPB    . metronidazole Stopped (12/11/19 0541)   PRN Meds:.acetaminophen **OR** acetaminophen, alum & mag hydroxide-simeth, bisacodyl, guaiFENesin-dextromethorphan, hydrALAZINE, labetalol, magnesium sulfate bolus IVPB, morphine injection, ondansetron, oxyCODONE-acetaminophen, phenol, potassium chloride, senna-docusate  ASSESMENT:   *   Acute ischemic, left-sided colitis.  Bloody stool, abdominal pain. Colonic diverticulosis without diverticulitis.  Do not suspect diverticular bleed. Day 3 Ancef, day 2 metronidazole.  WBCs improved.  Clinically much improved.  *    Mild anemia.  Hgb 13.1 >> 10.1 since pre-op.  2 PRBCs given 8/16 for intr-op blood loss of 700 mL  *   Long standing intermittent thrombocytopenia.   No liver pathology per CT.    *   Renal cysts/lesions, 1 in L upper pole is indeterminant and may warrant further eval. incidental finding on CT scan.  *   S/p 8/16 redo right femoropopliteal BPG, L femoral endarterectomy.  *   Chronic Coumadin, long-term for history DVT and more recent diagnosis PAF.  This has been on hold presurgery.  *    PAF, rapid A. fib yesterday. BPs remain low and heart rate remains elevated.  *   AKI.    PLAN   *   Complete 5 d abx?  Will d/w Dr Rush Landmark.    *  Full liquids for lunch, if tolerates then to [place] at dinner     Azucena Freed  12/11/2019, 10:23 AM Phone [phone]

## 2019-12-11 NOTE — Evaluation (Addendum)
Occupational Therapy Evaluation Patient Details Name: [name] [name] MRN: [id number] DOB: 02/16/1925 Today's Date: 12/11/2019    History of Present Illness Pt is a 86 y.o. F with significant PMH of HTN, paroxysmal atrial fibrillation, PVD, prior right femoropopliteal bypass who presents with redo right femoropopliteal bypass and left common femoral endarterectomy 12/09/2019. CT showing findings of acute colitis.    Clinical Impression   This 86 y/o female presents with the above. PTA pt living alone and performing ADL and mobility tasks with mod independence using SPC. Pt currently presenting with the above and below listed deficits. She currently requires two person assist for safe completion of bed mobility and functional transfers using RW, tolerating OOB to recliner during session. Pt requiring grossly minA for seated UB ADL and up to [place] for LB ADL. Pt in afib during session with HR 110s as high as 167 briefly during session. Pt to benefit from continued acute OT services and currently recommend CIR level therapies at time of discharge to progress pt towards her PLOF. Will follow.   BP Supine: 113/58 Sitting: 124/71 Seated in recliner end of session: 136/81    Follow Up Recommendations  CIR    Equipment Recommendations  3 in 1 bedside commode;Other (comment) (TBA in next venue)    Recommendations for Other Services Rehab consult     Precautions / Restrictions Precautions Precautions: Fall;Other (comment) Precaution Comments: afib Restrictions Weight Bearing Restrictions: No      Mobility Bed Mobility Overal bed mobility: Needs Assistance Bed Mobility: Supine to Sit     Supine to sit: Mod assist;+2 for physical assistance     General bed mobility comments: Cues for initiation of BLE movement, use of rail, modA + 2 for assist to complete BLE's off edge of bed and trunk to upright positioning  Transfers Overall transfer level: Needs assistance Equipment used:  Rolling walker (2 wheeled) Transfers: Sit to/from Stand Sit to Stand: Mod assist;+2 physical assistance         General transfer comment: ModA + 2 to rise from edge of bed, initial retropulsion. Cues for appropriate grip on walker handles.    Balance Overall balance assessment: Needs assistance Sitting-balance support: Feet unsupported Sitting balance-Leahy Scale: Poor Sitting balance - Comments: Requiring close min guard - min assist for safety. Improvement with foot support Postural control: Right lateral lean Standing balance support: Bilateral upper extremity supported Standing balance-Leahy Scale: Poor Standing balance comment: reliant on external support, posterior lean noted                           ADL either performed or assessed with clinical judgement   ADL Overall ADL's : Needs assistance/impaired Eating/Feeding: NPO Eating/Feeding Details (indicate cue type and reason): except ice chips  Grooming: Supervision/safety;Sitting Grooming Details (indicate cue type and reason): supported sitting, minA for sitting balance EOB when fatigued  Upper Body Bathing: Minimal assistance;Sitting   Lower Body Bathing: Maximal assistance;+2 for physical assistance;Sit to/from stand   Upper Body Dressing : Minimal assistance;Sitting   Lower Body Dressing: Total assistance;+2 for physical assistance;Sit to/from stand Lower Body Dressing Details (indicate cue type and reason): assist for donning socks today Toilet Transfer: Moderate assistance;+2 for physical assistance;+2 for safety/equipment;Stand-pivot;RW Toilet Transfer Details (indicate cue type and reason): simulated via transfer to [place] and Hygiene: Maximal assistance;+2 for physical assistance;+2 for safety/equipment;Sit to/from stand       Functional mobility during ADLs: Moderate assistance;+2 for safety/equipment;+2  for physical assistance;Rolling walker       Vision          Perception     Praxis      Pertinent Vitals/Pain Pain Assessment: Faces Faces Pain Scale: Hurts even more Pain Location: incisional sites Pain Descriptors / Indicators: Grimacing;Guarding Pain Intervention(s): Monitored during session;Limited activity within patient's tolerance;Repositioned     Hand Dominance     Extremity/Trunk Assessment Upper Extremity Assessment Upper Extremity Assessment: Generalized weakness   Lower Extremity Assessment Lower Extremity Assessment: Defer to PT evaluation       Communication Communication Communication: No difficulties   Cognition Arousal/Alertness: Awake/alert Behavior During Therapy: WFL for tasks assessed/performed Overall Cognitive Status: Impaired/Different from baseline Area of Impairment: Problem solving                             Problem Solving: Slow processing;Decreased initiation;Requires verbal cues;Requires tactile cues General Comments: Pt with slower processing noted, requiring cues for problem solving. Likely internally distracted by pain or due to medications   General Comments       Exercises Exercises: Other exercises Other Exercises Other Exercises: Encouraged digit flexion/extension for edema and ankle pumps   Shoulder Instructions      Home Living Family/patient expects to be discharged to:: Private residence Living Arrangements: Alone Available Help at Discharge: Family;Available PRN/intermittently Type of Home: House Home Access: Stairs to enter [employer] of Steps: 2 ("a couple") Entrance Stairs-Rails: Right;Left Home Layout: One level     Bathroom Shower/Tub: Chief Strategy Officer: Kasandra Knudsen - single point;Walker - 4 wheels;Shower seat;Grab bars - tub/shower;Hand held shower head          Prior Functioning/Environment Level of Independence: Independent with assistive device(s)        Comments: Using cane        OT Problem List:  Decreased strength;Decreased range of motion;Decreased activity tolerance;Impaired balance (sitting and/or standing);Decreased cognition;Decreased safety awareness;Decreased knowledge of use of DME or AE;Decreased knowledge of precautions;Pain;Cardiopulmonary status limiting activity      OT Treatment/Interventions: Self-care/ADL training;Therapeutic exercise;Energy conservation;DME and/or AE instruction;Therapeutic activities;Cognitive remediation/compensation;Patient/family education;Balance training    OT Goals(Current goals can be found in the care plan section) Acute Rehab OT Goals Patient Stated Goal: get out of bed OT Goal Formulation: With patient Time For Goal Achievement: 12/25/19 Potential to Achieve Goals: Good  OT Frequency: Min 2X/week   Barriers to D/C:            Co-evaluation PT/OT/SLP Co-Evaluation/Treatment: Yes Reason for Co-Treatment: For patient/therapist safety;To address functional/ADL transfers PT goals addressed during session: Mobility/safety with mobility OT goals addressed during session: ADL's and self-care      AM-PAC OT "6 Clicks" Daily Activity     Outcome Measure Help from another person eating meals?: Total (NPO) Help from another person taking care of personal grooming?: A Little Help from another person toileting, which includes using toliet, bedpan, or urinal?: A Lot Help from another person bathing (including washing, rinsing, drying)?: A Lot Help from another person to put on and taking off regular upper body clothing?: A Little Help from another person to put on and taking off regular lower body clothing?: Total 6 Click Score: 12   End of Session Equipment Utilized During Treatment: Gait belt;Rolling walker Nurse Communication: Mobility status  Activity Tolerance: Patient tolerated treatment well Patient left: in chair;with call bell/phone within reach;with chair alarm set;with family/visitor present  OT Visit Diagnosis: Unsteadiness on  feet (R26.81);Other abnormalities of gait and mobility (R26.89);Muscle weakness (generalized) (M62.81);Pain Pain - Right/Left:  (bil) Pain - part of body:  (groin/incisions)                Time: 4801-6553 OT Time Calculation (min): 29 min Charges:  OT General Charges $OT Visit: 1 Visit OT Evaluation $OT Eval Moderate Complexity: [place], OT Acute Rehabilitation Services Pager [phone] Office [phone]   Raymondo Band 12/11/2019, 9:47 AM

## 2019-12-11 NOTE — Progress Notes (Signed)
ANTICOAGULATION CONSULT NOTE - Initial Consult  Pharmacy Consult for Heparin Indication: atrial fibrillation and hx of multiple thrombosis  Allergies  Allergen Reactions  . Sulfa Antibiotics Other (See Comments)    Cold sweat light headed and disorientation  . Tiotropium Bromide Shortness Of Breath and Other (See Comments)    Sore throat also  . Latex Itching and Rash  . Ultram [Tramadol] Nausea And Vomiting  . 5-Alpha Reductase Inhibitors     Patient Measurements: Height: 4\' 11"  (149.9 cm) Weight: 74.5 kg (164 lb 3.2 oz) IBW/kg (Calculated) : 43.2 Heparin Dosing Weight: 60.1  Vital Signs: Temp: 97.9 F (36.6 C) (08/18 1132) Temp Source: Oral (08/18 1132) BP: 95/53 (08/18 1132) Pulse Rate: 106 (08/18 1132)  Labs: Recent Labs    12/09/19 3086 12/09/19 0840 12/09/19 2033 12/09/19 2033 12/09/19 2230 12/10/19 0400 12/10/19 0400 12/10/19 0523 12/10/19 1009 12/10/19 1009 12/10/19 1556 12/11/19 0024  HGB 13.1   < > 11.6*   < >  --  11.1*   < >  --  11.4*   < > 11.6* 10.1*  HCT 42.1   < > 34.8*   < >  --  33.3*   < >  --  33.4*  --  36.2 30.9*  PLT 209   < > 135*  --   --  128*  --   --   --   --   --  120*  APTT 28  --   --   --   --   --   --   --   --   --   --   --   LABPROT 12.4  --   --   --   --   --   --   --   --   --   --   --   INR 1.0  --   --   --   --   --   --   --   --   --   --   --   CREATININE 1.18*   < > 1.18*  --   --   --   --   --   --   --  1.23* 1.32*  TROPONINIHS  --   --   --   --  5  --   --  8  --   --   --   --    < > = values in this interval not displayed.    Estimated Creatinine Clearance: 23.4 mL/min (A) (by C-G formula based on SCr of 1.32 mg/dL (H)).   Medical History: Past Medical History:  Diagnosis Date  . Allergic rhinoconjunctivitis   . Allergy   . Anxiety    pt. managed- uses deep breathing   . Arthritis    low back , stenosis  . COLONIC POLYPS, RECURRENT 08/29/2006   2008 last colonoscopy. No further colonoscopy.      Marland Kitchen COPD (chronic obstructive pulmonary disease) ([place])   . Diverticulosis   . Dysrhythmia    afib  . Eczema   . Environmental allergies    allergy shot- q friday in Dr. Janee Morn office. PFT's abnormal- recommended Spiriva to use preop & will d/c after surgery  . GERD (gastroesophageal reflux disease)   . Hiatal hernia   . Hyperlipidemia   . Hypertension   . Lung nodule 2011  . Paroxysmal atrial fibrillation ([place]) 11/27/2019  . Peripheral vascular disease ([place])   .  Shingles   . Stenosis of popliteal artery ([facility])    blood clots in legs long ago    . Trigeminal neuralgia    Left buttocks    Medications:  Scheduled:  . sodium chloride   Intravenous Once  . aspirin EC  81 mg Oral QPM  . atorvastatin  40 mg Oral q1800  . baclofen  5 mg Oral BID  . Chlorhexidine Gluconate Cloth  6 each Topical Q0600  . docusate sodium  100 mg Oral Daily  . lamoTRIgine  100 mg Oral BID  . [START ON 12/12/2019] metoprolol succinate  50 mg Oral Daily  . mupirocin ointment  1 application Nasal BID  . pantoprazole  40 mg Oral BID   Infusions:  . sodium chloride 100 mL/hr at 12/11/19 1136  .  ceFAZolin (ANCEF) IV 2 g (12/11/19 0631)  . diltiazem (CARDIZEM) infusion 5 mg/hr (12/09/19 1801)  . magnesium sulfate bolus IVPB    . metronidazole 500 mg (12/11/19 1200)    Assessment: 86 yo F on Warfarin PTA for afib and hx of multiple thrombosis.  Warfarin was held in anticipation of R FPBG on 8/16.  INR on admission was 1.  Pt is now s/p VVS procedure with concerns for ischemic colitis.  Pt reported bloody stools 8/16-17.  GI is also following and is ok with the initiation of heparin for afib - no bolus.  Goal of Therapy:  Heparin level 0.3-0.7 units/ml Monitor platelets by anticoagulation protocol: Yes   Plan:  Start heparin at 800 units/hr - no bolus Heparin level in 8 hours Heparin level and CBC daily while on heparin.  [employer], Pharm.D., BCPS Clinical Pharmacist Clinical phone for  12/11/2019 from 8:30-4:00 is [phone].  **Pharmacist phone directory can be found on [email or web address] listed under [place].  12/11/2019 12:44 PM

## 2019-12-11 NOTE — PMR Pre-admission (Signed)
PMR Admission Coordinator Pre-Admission Assessment   Patient: [name] [name] is an 86 y.o., female MRN: [id number] DOB: 07/29/1924 Height: 4' 11" (149.9 cm) Weight: 74.5 kg   Insurance Information HMO: No    PPO:       PCP:       IPA:       80/20:       OTHER:   PRIMARY: Medicare A and B      Policy#: [id number]      Subscriber: patient CM Name:        Phone#:       Fax#:   Pre-Cert#:        Employer: Retired Benefits:  Phone #:       Name: Checked in [employer] Eff. Date: 10/24/1991     Deduct: $1484      Out of Pocket Max: none      Life Max: N/A CIR: 100%      SNF: 100 days Outpatient: 80%     Co-Pay: 20% Home Health: 100%      Co-Pay: 20% DME: 80%     Co-Pay: 20% Providers: patient's choice  SECONDARY: BCBS supplement      Policy#: [id number]     Phone#: patient       The "Data Collection Information Summary" for patients in Inpatient Rehabilitation Facilities with attached "Privacy Act Statement-Health Care Records" was provided and verbally reviewed with: Patient and Family   Emergency Contact Information         Contact Information     Name Relation Home Work Mobile    Simao,Scott Son     [phone]    Ellington, Cynthia Daughter [phone]        Fox,Faye Daughter [phone]             Current Medical History  Patient Admitting Diagnosis: PVD   History of Present Illness:  [name] [name] is a 86-year-old right-handed female with history of hypertension, hyperlipidemia, paroxysmal atrial fibrillation maintained on chronic Coumadin followed by cardiology services Dr. Poydras, COPD as well as peripheral vascular disease.  Per chart review patient lives alone.  Modified independent using straight point cane.  Performing all ADLs and mobility tasks.  1 level home with 2 steps to entry.  She does have a son and daughter in the area.  About 4 to 6 weeks ago patient was having difficulty ambulating with increased rest pain to the right foot.  She had  previously undergone right femoral to above-knee popliteal bypass 2006 by Dr. Bowman revised in 2010 as well as 1 prior thrombolysis procedure to the right leg.  She had an arteriogram in February 2020 that showed 80% narrowing of the midportion of her right femoral-popliteal bypass graft.  She also had a 70% stenosis of her left common femoral artery.  Patient was admitted 12/09/2019 and underwent redo right femoral to below-knee popliteal bypass as well as left common femoral enterectomy bovine pericardial patch 12/09/2019 per Dr. Fields.  Postoperatively patient did develop some painless rectal bleeding 12/10/2019 with GI consulted Dr.Nandigmam and underwent CT of the abdomen findings consistent with ischemic colitis and close monitoring of hemoglobin 8.5 and does remain on intravenous Ancef.  Follow-up cardiology services for atrial fibrillation RVR after Coumadin initially held for revascularization and initially placed on intravenous heparin held due to anemia and transitioned to ELIQUIS 12/13/2019 and heparin  stopped.   Patient's medical record from [facility] has been reviewed by the rehabilitation admission coordinator and physician.     Past Medical History      Past Medical History:  Diagnosis Date  . Allergic rhinoconjunctivitis    . Allergy    . Anxiety      pt. managed- uses deep breathing   . Arthritis      low back , stenosis  . COLONIC POLYPS, RECURRENT 08/29/2006    2008 last colonoscopy. No further colonoscopy.     . COPD (chronic obstructive pulmonary disease) (HCC)    . Diverticulosis    . Dysrhythmia      afib  . Eczema    . Environmental allergies      allergy shot- q friday in Dr. Young's office. PFT's abnormal- recommended Spiriva to use preop & will d/c after surgery  . GERD (gastroesophageal reflux disease)    . Hiatal hernia    . Hyperlipidemia    . Hypertension    . Lung nodule 2011  . Paroxysmal atrial fibrillation (HCC) 11/27/2019  . Peripheral vascular  disease (HCC)    . Shingles    . Stenosis of popliteal artery (HCC)      blood clots in legs long ago    . Trigeminal neuralgia      Left buttocks      Family History   family history includes Arthritis in her mother; Brain cancer in her sister; Breast cancer in her daughter and sister; Colon cancer in her sister; Colon polyps in her father; Heart disease in her father; Hypertension in her mother and son; Lung cancer in her brother and sister.   Prior Rehab/Hospitalizations Has the patient had prior rehab or hospitalizations prior to admission? No   Has the patient had major surgery during 100 days prior to admission? Yes              Current Medications   Current Facility-Administered Medications:  .  0.9 %  sodium chloride infusion (Manually program via Guardrails IV Fluids), , Intravenous, Once, Setzer, Sandra J, PA-C .  acetaminophen (TYLENOL) tablet 325-650 mg, 325-650 mg, Oral, Q4H PRN, 650 mg at 12/10/19 0104 **OR** acetaminophen (TYLENOL) suppository 325-650 mg, 325-650 mg, Rectal, Q4H PRN, Setzer, Sandra J, PA-C .  alum & mag hydroxide-simeth (MAALOX/MYLANTA) 200-200-20 MG/5ML suspension 15-30 mL, 15-30 mL, Oral, Q2H PRN, Setzer, Sandra J, PA-C .  aspirin EC tablet 81 mg, 81 mg, Oral, QPM, Setzer, Sandra J, PA-C, 81 mg at 12/12/19 1834 .  atorvastatin (LIPITOR) tablet 40 mg, 40 mg, Oral, q1800, Setzer, Sandra J, PA-C, 40 mg at 12/12/19 1834 .  baclofen (LIORESAL) tablet 5 mg, 5 mg, Oral, BID, Setzer, Sandra J, PA-C, 5 mg at 12/12/19 2214 .  bisacodyl (DULCOLAX) suppository 10 mg, 10 mg, Rectal, Daily PRN, Setzer, Sandra J, PA-C .  ceFAZolin (ANCEF) IVPB 2g/100 mL premix, 2 g, Intravenous, Q12H, Fields, Charles E, MD, Last Rate: 200 mL/hr at 12/13/19 0922, 2 g at 12/13/19 0922 .  Chlorhexidine Gluconate Cloth 2 % PADS 6 each, 6 each, Topical, Q0600, Fields, Charles E, MD, 6 each at 12/13/19 0641 .  docusate sodium (COLACE) capsule 100 mg, 100 mg, Oral, Daily, Setzer, Sandra J,  PA-C, 100 mg at 12/13/19 0910 .  guaiFENesin-dextromethorphan (ROBITUSSIN DM) 100-10 MG/5ML syrup 15 mL, 15 mL, Oral, Q4H PRN, Setzer, Sandra J, PA-C .  hydrALAZINE (APRESOLINE) injection 5 mg, 5 mg, Intravenous, Q20 Min PRN, Setzer, Sandra J, PA-C .  labetalol (NORMODYNE) injection 10 mg, 10 mg, Intravenous, Q10 min PRN, Setzer, Sandra J, PA-C .  lamoTRIgine (LAMICTAL) tablet 100 mg, 100   mg, Oral, BID, Setzer, Sandra J, PA-C, 100 mg at 12/13/19 0910 .  magnesium sulfate IVPB 2 g 50 mL, 2 g, Intravenous, Daily PRN, Setzer, Sandra J, PA-C .  metoprolol succinate (TOPROL-XL) 24 hr tablet 50 mg, 50 mg, Oral, Daily, McDaniel, Jill D, NP, 50 mg at 12/13/19 0910 .  metroNIDAZOLE (FLAGYL) IVPB 500 mg, 500 mg, Intravenous, Q8H, Fields, Charles E, MD, Last Rate: 100 mL/hr at 12/13/19 0444, 500 mg at 12/13/19 0444 .  morphine 2 MG/ML injection 2 mg, 2 mg, Intravenous, Q4H PRN, Setzer, Sandra J, PA-C .  mupirocin ointment (BACTROBAN) 2 % 1 application, 1 application, Nasal, BID, Fields, Charles E, MD, 1 application at 12/13/19 0924 .  ondansetron (ZOFRAN) injection 4 mg, 4 mg, Intravenous, Q6H PRN, Setzer, Sandra J, PA-C, 4 mg at 12/11/19 0019 .  oxyCODONE-acetaminophen (PERCOCET/ROXICET) 5-325 MG per tablet 1-2 tablet, 1-2 tablet, Oral, Q4H PRN, Setzer, Sandra J, PA-C, 1 tablet at 12/12/19 2218 .  pantoprazole (PROTONIX) EC tablet 40 mg, 40 mg, Oral, BID, Setzer, Sandra J, PA-C, 40 mg at 12/13/19 0910 .  phenol (CHLORASEPTIC) mouth spray 1 spray, 1 spray, Mouth/Throat, PRN, Setzer, Sandra J, PA-C .  potassium chloride SA (KLOR-CON) CR tablet 20-40 mEq, 20-40 mEq, Oral, Daily PRN, Setzer, Sandra J, PA-C .  senna-docusate (Senokot-S) tablet 1 tablet, 1 tablet, Oral, QHS PRN, Setzer, Sandra J, PA-C   Patients Current Diet:     Diet Order                      Diet Heart Room service appropriate? Yes; Fluid consistency: Thin  Diet effective 1400                      Precautions /  Restrictions Precautions Precautions: Fall, Other (comment) Precaution Comments: watch HR  Restrictions Weight Bearing Restrictions: No    Has the patient had 2 or more falls or a fall with injury in the past year? No   Prior Activity Level Community (5-7x/wk): Went out as needed, was driving.   Prior Functional Level Self Care: Did the patient need help bathing, dressing, using the toilet or eating? Independent   Indoor Mobility: Did the patient need assistance with walking from room to room (with or without device)? Independent   Stairs: Did the patient need assistance with internal or external stairs (with or without device)? Independent   Functional Cognition: Did the patient need help planning regular tasks such as shopping or remembering to take medications? Independent   Home Assistive Devices / Equipment Home Assistive Devices/Equipment: Dentures (specify type), Cane (specify quad or straight), Built-in shower seat, Hand-held shower hose, Grab bars in shower Home Equipment: Cane - single point, Walker - 4 wheels, Shower seat, Grab bars - tub/shower, Hand held shower head   Prior Device Use: Indicate devices/aids used by the patient prior to current illness, exacerbation or injury? Walker and SPC   Current Functional Level Cognition   Overall Cognitive Status: Within Functional Limits for tasks assessed Orientation Level: Oriented X4 General Comments: Pt with much improved cognition this session.     Extremity Assessment (includes Sensation/Coordination)   Upper Extremity Assessment: Generalized weakness  Lower Extremity Assessment: Defer to PT evaluation     ADLs   Overall ADL's : Needs assistance/impaired Eating/Feeding: NPO Eating/Feeding Details (indicate cue type and reason): except ice chips  Grooming: Supervision/safety, Sitting Grooming Details (indicate cue type and reason): supported sitting, minA for sitting balance EOB when fatigued    Upper Body Bathing:  Minimal assistance, Sitting Lower Body Bathing: Maximal assistance, +2 for physical assistance, Sit to/from stand Upper Body Dressing : Minimal assistance, Sitting Lower Body Dressing: Total assistance, +2 for physical assistance, Sit to/from stand Lower Body Dressing Details (indicate cue type and reason): assist for donning socks today Toilet Transfer: Moderate assistance, +2 for physical assistance, +2 for safety/equipment, Stand-pivot, RW Toilet Transfer Details (indicate cue type and reason): simulated via transfer to recliner  Toileting- Clothing Manipulation and Hygiene: Maximal assistance, +2 for physical assistance, +2 for safety/equipment, Sit to/from stand Functional mobility during ADLs: Moderate assistance, +2 for safety/equipment, +2 for physical assistance, Rolling walker     Mobility   Overal bed mobility: Needs Assistance Bed Mobility: Supine to Sit Supine to sit: Min guard General bed mobility comments: Increased time required, and min guard for safety. Pt heavily reliant on bed rails.      Transfers   Overall transfer level: Needs assistance Equipment used: Rolling walker (2 wheeled) Transfers: Sit to/from Stand, Stand Pivot Transfers Sit to Stand: Min assist Stand pivot transfers: Min assist General transfer comment: Min A for lift assist and steadying. Pt with slow shuffled steps to chair and required min A for steadying.      Ambulation / Gait / Stairs / Wheelchair Mobility   Ambulation/Gait Ambulation/Gait assistance: Mod assist, +2 physical assistance Gait Distance (Feet): 3 Feet Assistive device: Rolling walker (2 wheeled) Gait Pattern/deviations: Step-to pattern, Decreased stride length General Gait Details: Pt with pivotal steps from bed to chair, modA + 2 for stability. Max cues for stepping initiation, anterior weight shift, sequencing/direction.  Gait velocity: decreased Gait velocity interpretation: <1.31 ft/sec, indicative of household ambulator      Posture / Balance Dynamic Sitting Balance Sitting balance - Comments: Requiring close min guard - min assist for safety. Improvement with foot support Balance Overall balance assessment: Needs assistance Sitting-balance support: Feet unsupported Sitting balance-Leahy Scale: Fair Sitting balance - Comments: Requiring close min guard - min assist for safety. Improvement with foot support Postural control: Right lateral lean Standing balance support: Bilateral upper extremity supported, During functional activity Standing balance-Leahy Scale: Poor Standing balance comment: Reliant on BUE support      Special needs/care consideration Skin Surgical Incision Special service needs: Pt. On IV Acef and Flagyl  Designated visitor  Cynthia Ellington     Previous Home Environment (from acute therapy documentation) Living Arrangements: Alone Available Help at Discharge: Family, Available PRN/intermittently Type of Home: House Home Layout: One level Home Access: Stairs to enter Entrance Stairs-Rails: Right, Left Entrance Stairs-Number of Steps: 2 ("a couple") Bathroom Shower/Tub: Walk-in shower   Discharge Living Setting Plans for Discharge Living Setting: Patient's home, Alone, Other (Comment) Type of Home at Discharge: Other (Comment) (Condominium) Discharge Home Layout: One level, Able to live on main level with bedroom/bathroom Discharge Home Access: Stairs to enter Entrance Stairs-Rails: Right, Left, Can reach both Entrance Stairs-Number of Steps: 1 step Discharge Bathroom Shower/Tub: Walk-in shower, Door Discharge Bathroom Toilet: Handicapped height Discharge Bathroom Accessibility: Yes How Accessible: Accessible via walker   Social/Family/Support Systems Patient Roles: Parent (Has 2 daughters and 1 son in the area.) Contact Information: Cynthia Ellington - daughter [phone] Anticipated Caregiver: 3 adult children Ability/Limitations of Caregiver: Son is retired.  One daughter  works, one daughter is retired Caregiver Availability: Intermittent (Could stay with patient if needed) Discharge Plan Discussed with Primary Caregiver: Yes (Discussed with patient and son.) Is Caregiver In Agreement with Plan?: Yes Does Caregiver/Family have Issues with   Lodging/Transportation while Pt is in Rehab?: No   Goals Patient/Family Goal for Rehab: PT/OT mod I and supervision goals Expected length of stay: 7-10 days Cultural Considerations: None Pt/Family Agrees to Admission and willing to participate: Yes Program Orientation Provided & Reviewed with Pt/Caregiver Including Roles  & Responsibilities: Yes   Decrease burden of Care through IP rehab admission: N/A   Possible need for SNF placement upon discharge: Not anticipated   Patient Condition: I have reviewed medical records from [facility] , spoken with CSW, and patient and daughter. I met with patient at the bedside for inpatient rehabilitation assessment.  Patient will benefit from ongoing PT and OT, can actively participate in 3 hours of therapy a day 5 days of the week, and can make measurable gains during the admission.  Patient will also benefit from the coordinated team approach during an Inpatient Acute Rehabilitation admission.  The patient will receive intensive therapy as well as Rehabilitation physician, nursing, social worker, and care management interventions.  Due to safety, skin/wound care, disease management, medication administration, pain management and patient education the patient requires 24 hour a day rehabilitation nursing.  The patient is currently Mod +2 to Min A  with mobility and basic ADLs.  Discharge setting and therapy post discharge at home with home health is anticipated.  Patient has agreed to participate in the Acute Inpatient Rehabilitation Program and will admit today.   Preadmission Screen Completed By:  Laura B Staley, 12/13/2019 12:07  PM ______________________________________________________________________   Discussed status with Dr. Jaslene Marsteller on 12/13/2019 at 930 and received approval for admission today.   Admission Coordinator:  Laura B Staley, CCC-SLP, Updates added by Laura Staley, MS CCC-SLP time 1205/Date 12/13/2019   Assessment/Plan: Diagnosis: PVD 1. Does the need for close, 24 hr/day Medical supervision in concert with the patient's rehab needs make it unreasonable for this patient to be served in a less intensive setting? Yes  2. Co-Morbidities requiring supervision/potential complications:  Rectal bleeding, ischemic colitis, atrial fibrillation RVR, hypertension, hyperlipidemia, paroxysmal atrial fibrillation, COPD, peripheral vascular disease  3. Due to safety, skin/wound care, disease management, pain management and patient education, does the patient require 24 hr/day rehab nursing? Yes 4. Does the patient require coordinated care of a physician, rehab nurse, PT, OT to address physical and functional deficits in the context of the above medical diagnosis(es)? Yes Addressing deficits in the following areas: balance, endurance, locomotion, strength, transferring, bathing, dressing, toileting and psychosocial support 5. Can the patient actively participate in an intensive therapy program of at least 3 hrs of therapy 5 days a week? Yes 6. The potential for patient to make measurable gains while on inpatient rehab is excellent 7. Anticipated functional outcomes upon discharge from inpatient rehab: modified independent and supervision PT, modified independent and supervision OT, n/a SLP 8. Estimated rehab length of stay to reach the above functional goals is: 7-11 days. 9. Anticipated discharge destination: Home 10. Overall Rehab/Functional Prognosis: excellent     MD Signature: Jovonda Selner, MD, ABPMR  

## 2019-12-12 ENCOUNTER — Inpatient Hospital Stay (HOSPITAL_COMMUNITY): Payer: Medicare Other

## 2019-12-12 ENCOUNTER — Telehealth: Payer: Self-pay | Admitting: Neurology

## 2019-12-12 ENCOUNTER — Ambulatory Visit: Payer: Medicare Other

## 2019-12-12 ENCOUNTER — Encounter (HOSPITAL_COMMUNITY): Payer: Medicare Other

## 2019-12-12 ENCOUNTER — Other Ambulatory Visit: Payer: Self-pay

## 2019-12-12 DIAGNOSIS — I7090 Unspecified atherosclerosis: Secondary | ICD-10-CM

## 2019-12-12 DIAGNOSIS — I4819 Other persistent atrial fibrillation: Secondary | ICD-10-CM

## 2019-12-12 DIAGNOSIS — Z9889 Other specified postprocedural states: Secondary | ICD-10-CM

## 2019-12-12 LAB — CBC
HCT: 24.6 % — ABNORMAL LOW (ref 36.0–46.0)
Hemoglobin: 8 g/dL — ABNORMAL LOW (ref 12.0–15.0)
MCH: 29.9 pg (ref 26.0–34.0)
MCHC: 32.5 g/dL (ref 30.0–36.0)
MCV: 91.8 fL (ref 80.0–100.0)
Platelets: 102 10*3/uL — ABNORMAL LOW (ref 150–400)
RBC: 2.68 MIL/uL — ABNORMAL LOW (ref 3.87–5.11)
RDW: 15 % (ref 11.5–15.5)
WBC: 12.6 10*3/uL — ABNORMAL HIGH (ref 4.0–10.5)
nRBC: 0 % (ref 0.0–0.2)

## 2019-12-12 LAB — HEPARIN LEVEL (UNFRACTIONATED): Heparin Unfractionated: 0.15 IU/mL — ABNORMAL LOW (ref 0.30–0.70)

## 2019-12-12 MED ORDER — POTASSIUM PHOSPHATES 15 MMOLE/5ML IV SOLN
20.0000 mmol | Freq: Once | INTRAVENOUS | Status: DC
  Filled 2019-12-12: qty 6.67

## 2019-12-12 MED ORDER — FUROSEMIDE 10 MG/ML IJ SOLN
20.0000 mg | Freq: Once | INTRAMUSCULAR | Status: AC
  Administered 2019-12-12: 20 mg via INTRAVENOUS
  Filled 2019-12-12: qty 2

## 2019-12-12 MED ORDER — POTASSIUM CHLORIDE 10 MEQ/100ML IV SOLN
10.0000 meq | INTRAVENOUS | Status: AC
  Administered 2019-12-12 (×2): 10 meq via INTRAVENOUS
  Filled 2019-12-12 (×2): qty 100

## 2019-12-12 NOTE — Telephone Encounter (Signed)
Patient called and left a message requesting a call back from a nurse. She has a medication question but did not leave any other details.

## 2019-12-12 NOTE — H&P (Signed)
Physical Medicine and Rehabilitation Admission H&P     HPI: [name] [name] is a 86 year old right-handed female with history of hypertension, hyperlipidemia, failed back syndrome, paroxysmal atrial fibrillation maintained on chronic Coumadin followed by cardiology services Dr. Oval Linsey, COPD as well as peripheral vascular disease.  History taken from chart review and patient.  Patient lives alone.  Modified independent using straight point cane.  Performing all ADLs and mobility tasks.  1 level home with 2 steps to entry.  She does have a son and daughter in the area.  About 4 to 6 weeks ago patient was having difficulty with ambulation with increasing rest pain to the right foot.  She had previously undergone right femoral to above-knee popliteal bypass 2006 by [name] revised in 2010 as well as 1 prior thrombolysis procedure to the right leg.  She had an arteriogram in February 2020 that showed 80% narrowing of the midportion of her right femoral-popliteal bypass graft.  She also had a 70% stenosis of her left common femoral artery.  Patient was admitted 12/09/2019 and underwent redo right femoral to below-knee popliteal bypass as well as left common femoral enterectomy bovine pericardial patch on 12/09/2019 per Dr. Oneida Alar.  Postoperatively patient did develop some painless rectal bleeding 12/10/2019 with GI consulted Dr.Nandigmam and underwent CT of the abdomen findings consistent with ischemic colitis and close monitoring of hemoglobin 8.5 and does remain on intravenous Ancef.  Hospital course further complicated by atrial fibrillation with RVR, cardiology consulted, initially held for revascularization and initially placed on intravenous heparin held due to anemia and transitioned to [facility] 12/13/2019 and heparin  stopped. Therapy evaluations completed and patient was admitted for a comprehensive rehab program.  Please see preadmission assessment from earlier today as well.  Review of Systems    Constitutional: Positive for malaise/fatigue. Negative for chills and fever.  HENT: Negative for hearing loss.   Eyes: Negative for blurred vision and double vision.  Respiratory: Positive for shortness of breath. Negative for cough.        Shortness of breath with exertion  Cardiovascular: Positive for leg swelling.  Gastrointestinal: Positive for constipation. Negative for heartburn, nausea and vomiting.       GERD  Genitourinary: Negative for dysuria, flank pain and hematuria.  Musculoskeletal: Positive for back pain, joint pain and myalgias.  Skin: Negative for rash.  Neurological: Positive for weakness. Negative for sensory change and focal weakness.  Psychiatric/Behavioral: Positive for depression.       Anxiety  All other systems reviewed and are negative.  Past Medical History:  Diagnosis Date  . Allergic rhinoconjunctivitis   . Allergy   . Anxiety    pt. managed- uses deep breathing   . Arthritis    low back , stenosis  . COLONIC POLYPS, RECURRENT 08/29/2006   2008 last colonoscopy. No further colonoscopy.     Marland Kitchen COPD (chronic obstructive pulmonary disease) ([place])   . Diverticulosis   . Dysrhythmia    afib  . Eczema   . Environmental allergies    allergy shot- q friday in Dr. Janee Morn office. PFT's abnormal- recommended Spiriva to use preop & will d/c after surgery  . GERD (gastroesophageal reflux disease)   . Hiatal hernia   . Hyperlipidemia   . Hypertension   . Lung nodule 2011  . Paroxysmal atrial fibrillation ([place]) 11/27/2019  . Peripheral vascular disease ([place])   . Shingles   . Stenosis of popliteal artery ([facility])    blood clots in legs long  ago    . Trigeminal neuralgia    Left buttocks   Past Surgical History:  Procedure Laterality Date  . ABDOMINAL AORTAGRAM N/A 06/17/2011   Procedure: ABDOMINAL Maxcine Ham;  Surgeon: Elam Dutch, MD;  Location: [facility] CATH LAB;  Service: Cardiovascular;  Laterality: N/A;  . ABDOMINAL AORTOGRAM W/LOWER EXTREMITY Bilateral  06/22/2018   Procedure: ABDOMINAL AORTOGRAM W/LOWER EXTREMITY;  Surgeon: Elam Dutch, MD;  Location: [facility] CV LAB;  Service: Cardiovascular;  Laterality: Bilateral;  . ABDOMINAL AORTOGRAM W/LOWER EXTREMITY Bilateral 11/29/2019   Procedure: ABDOMINAL AORTOGRAM W/LOWER EXTREMITY;  Surgeon: Elam Dutch, MD;  Location: [facility] CV LAB;  Service: Cardiovascular;  Laterality: Bilateral;  . CATARACT EXTRACTION, BILATERAL     w IOL  . CHOLECYSTECTOMY  1998  . COLONOSCOPY    . ENDARTERECTOMY FEMORAL Left 12/09/2019   Procedure: ENDARTERECTOMY FEMORAL with bovine patch angioplasty.;  Surgeon: Elam Dutch, MD;  Location: [facility] OR;  Service: Vascular;  Laterality: Left;  . FEMORAL-POPLITEAL BYPASS GRAFT        x2 surgeries 1990's & 2009  . FEMORAL-POPLITEAL BYPASS GRAFT Right 12/09/2019   Procedure: REDO RIGHT FEMORAL-POPLITEAL ARTERY BYPASS GRAFT;  Surgeon: Elam Dutch, MD;  Location: [facility];  Service: Vascular;  Laterality: Right;  . INJECTION KNEE Right Aug. 2016   Gel injection for pain  . LUMBAR FUSION  07/06/2011  . TONSILLECTOMY     as a teenager   . TUBAL LIGATION     Family History  Problem Relation Age of Onset  . Arthritis Mother   . Hypertension Mother   . Heart disease Father   . Colon polyps Father   . Breast cancer Sister   . Breast cancer Daughter   . Hypertension Son   . Lung cancer Brother   . Colon cancer Sister   . Brain cancer Sister   . Lung cancer Sister   . Anesthesia problems Neg Hx   . Hypotension Neg Hx   . Malignant hyperthermia Neg Hx   . Pseudochol deficiency Neg Hx    Social History:  reports that she quit smoking about 26 years ago. Her smoking use included cigarettes. She has a 60.00 pack-year smoking history. She has never used smokeless tobacco. She reports that she does not drink alcohol and does not use drugs. Allergies:  Allergies  Allergen Reactions  . Sulfa Antibiotics Other (See Comments)    Cold sweat light headed and  disorientation  . Tiotropium Bromide Shortness Of Breath and Other (See Comments)    Sore throat also  . Latex Itching and Rash  . Ultram [Tramadol] Nausea And Vomiting  . 5-Alpha Reductase Inhibitors    Medications Prior to Admission  Medication Sig Dispense Refill  . acetaminophen (TYLENOL) 500 MG tablet Take 1,000 mg by mouth 2 (two) times daily.     Marland Kitchen aspirin EC 81 MG tablet Take 81 mg by mouth every evening.     Marland Kitchen atorvastatin (LIPITOR) 40 MG tablet Take 1 tablet (40 mg total) by mouth daily. 90 tablet 3  . baclofen (LIORESAL) 10 MG tablet TAKE 1/2 (ONE-HALF) TABLET BY MOUTH ONCE DAILY NIGHTLY (Patient taking differently: 5 mg 2 (two) times daily. ) 30 tablet 2  . hydrochlorothiazide (HYDRODIURIL) 25 MG tablet TAKE 1 TABLET BY MOUTH AT BEDTIME 90 tablet 0  . irbesartan (AVAPRO) 300 MG tablet Take 1 tablet by mouth once daily 90 tablet 0  . lamoTRIgine (LAMICTAL) 100 MG tablet Take 1 tablet by mouth twice daily  60 tablet 5  . Menthol (ICY HOT) 5 % PTCH Apply 1 patch topically daily as needed (back pain).    . metoprolol succinate (TOPROL XL) 25 MG 24 hr tablet Take 1 tablet (25 mg total) by mouth daily. 90 tablet 1  . Multiple Vitamin (MULTIVITAMIN WITH MINERALS) TABS tablet Take 1 tablet by mouth daily.    . pantoprazole (PROTONIX) 40 MG tablet Take 1 tablet (40 mg total) by mouth 2 (two) times daily. 60 tablet 0  . RESTASIS 0.05 % ophthalmic emulsion Place 1 drop into both eyes 2 (two) times daily as needed (dry eyes).   99  . colchicine (COLCRYS) 0.6 MG tablet Take 2 pills at first sign gout flare, then another pill 2 hours later if still having pain. Then take once daily until pain resolves. 30 tablet 0  . warfarin (COUMADIN) 5 MG tablet TAKE 1 TABLET BY MOUTH ONCE DAILY, EXCEPT TAKE 1 & 1/2 TABLETS ON MONDAY OR TAKE AS DIRECTED BY ANTICOAGULATION CLINIC (Patient taking differently: Take 5 mg by mouth every evening. ) 120 tablet 0    Drug Regimen Review Drug regimen was reviewed and  remains appropriate with no significant issues identified  Home: Home Living Family/patient expects to be discharged to:: Private residence Living Arrangements: Alone Available Help at Discharge: Family, Available PRN/intermittently Type of Home: House Home Access: Stairs to enter Technical brewer of Steps: 2 ("a couple") Entrance Stairs-Rails: Right, Left Home Layout: One level Bathroom Shower/Tub: Insurance claims handler: Radio producer - single point, Environmental consultant - 4 wheels, Shower seat, Grab bars - tub/shower, Hand held shower head   Functional History: Prior Function Level of Independence: Independent with assistive device(s) Comments: Using cane  Functional Status:  Mobility: Bed Mobility Overal bed mobility: Needs Assistance Bed Mobility: Supine to Sit Supine to sit: Min guard General bed mobility comments: Increased time required, and min guard for safety. Pt heavily reliant on bed rails.  Transfers Overall transfer level: Needs assistance Equipment used: Rolling walker (2 wheeled) Transfers: Sit to/from Stand, [employer] Transfers Sit to Stand: Min assist Stand pivot transfers: Min assist General transfer comment: Min A for lift assist and steadying. Pt with slow shuffled steps to chair and required min A for steadying.  Ambulation/Gait Ambulation/Gait assistance: Mod assist, +2 physical assistance Gait Distance (Feet): 3 Feet Assistive device: Rolling walker (2 wheeled) Gait Pattern/deviations: Step-to pattern, Decreased stride length General Gait Details: Pt with pivotal steps from bed to chair, modA + 2 for stability. Max cues for stepping initiation, anterior weight shift, sequencing/direction.  Gait velocity: decreased Gait velocity interpretation: <1.31 ft/sec, indicative of household ambulator    ADL: ADL Overall ADL's : Needs assistance/impaired Eating/Feeding: NPO Eating/Feeding Details (indicate cue type and reason): except ice chips  Grooming:  Supervision/safety, Sitting Grooming Details (indicate cue type and reason): supported sitting, minA for sitting balance EOB when fatigued  Upper Body Bathing: Minimal assistance, Sitting Lower Body Bathing: Maximal assistance, +2 for physical assistance, Sit to/from stand Upper Body Dressing : Minimal assistance, Sitting Lower Body Dressing: Total assistance, +2 for physical assistance, Sit to/from stand Lower Body Dressing Details (indicate cue type and reason): assist for donning socks today Toilet Transfer: Moderate assistance, +2 for physical assistance, +2 for safety/equipment, Stand-pivot, RW Toilet Transfer Details (indicate cue type and reason): simulated via transfer to [place] and Hygiene: Maximal assistance, +2 for physical assistance, +2 for safety/equipment, Sit to/from stand Functional mobility during ADLs: Moderate assistance, +2 for safety/equipment, +2 for physical  assistance, Rolling walker  Cognition: Cognition Overall Cognitive Status: Within Functional Limits for tasks assessed Orientation Level: Oriented X4 Cognition Arousal/Alertness: Awake/alert Behavior During Therapy: WFL for tasks assessed/performed Overall Cognitive Status: Within Functional Limits for tasks assessed Area of Impairment: Problem solving Problem Solving: Slow processing, Decreased initiation, Requires verbal cues, Requires tactile cues General Comments: Pt with much improved cognition this session.   Physical Exam: Blood pressure 131/77, pulse 99, temperature 97.8 F (36.6 C), temperature source Oral, resp. rate 20, height 4\' 11"  (1.499 m), weight 74.5 kg, SpO2 97 %. Physical Exam Vitals reviewed.  Constitutional:      General: She is not in acute distress.    Appearance: She is obese.  HENT:     Head: Normocephalic and atraumatic.     Right Ear: External ear normal.     Left Ear: External ear normal.     Nose: Nose normal.  Eyes:     General:         Right eye: No discharge.        Left eye: No discharge.     Extraocular Movements: Extraocular movements intact.  Cardiovascular:     Comments: Irregularly irregular Pulmonary:     Effort: Pulmonary effort is normal. No respiratory distress.     Breath sounds: No stridor.  Abdominal:     General: Abdomen is flat. Bowel sounds are normal. There is no distension.  Musculoskeletal:     Cervical back: Normal range of motion and neck supple.     Comments: Generalized edema Right lower extremity tenderness  Skin:    General: Skin is warm and dry.     Comments: Lower extremity revascularization sites CDI  Neurological:     Mental Status: She is alert.     Comments: Follows commands. Alert and oriented Motor: 4/5 throughout  Psychiatric:        Mood and Affect: Mood normal.        Behavior: Behavior normal.        Thought Content: Thought content normal.     Results for orders placed or performed during the hospital encounter of 12/09/19 (from the past 48 hour(s))  Heparin level (unfractionated)     Status: Abnormal   Collection Time: 12/11/19 10:16 PM  Result Value Ref Range   Heparin Unfractionated <0.10 (L) 0.30 - 0.70 IU/mL    Comment: (NOTE) If heparin results are below expected values, and patient dosage has  been confirmed, suggest follow up testing of antithrombin III levels. Performed at [facility], [place] [address], [place], [name] [address]   Heparin level (unfractionated)     Status: Abnormal   Collection Time: 12/12/19  2:51 AM  Result Value Ref Range   Heparin Unfractionated 0.15 (L) 0.30 - 0.70 IU/mL    Comment: (NOTE) If heparin results are below expected values, and patient dosage has  been confirmed, suggest follow up testing of antithrombin III levels. Performed at [facility], [place] [address] [place], [name] [address]   CBC     Status: Abnormal   Collection Time: 12/12/19  2:51 AM  Result Value Ref Range   WBC 12.6 (H) 4.0 - 10.5 K/uL    RBC 2.68 (L) 3.87 - 5.11 MIL/uL   Hemoglobin 8.0 (L) 12.0 - 15.0 g/dL    Comment: REPEATED TO VERIFY   HCT 24.6 (L) 36 - 46 %   MCV 91.8 80.0 - 100.0 fL   MCH 29.9 26.0 - 34.0 pg  MCHC 32.5 30.0 - 36.0 g/dL   RDW 15.0 11.5 - 15.5 %   Platelets 102 (L) 150 - 400 K/uL    Comment: REPEATED TO VERIFY PLATELET COUNT CONFIRMED BY SMEAR SPECIMEN CHECKED FOR CLOTS Immature Platelet Fraction may be clinically indicated, consider ordering this additional test [id number]    nRBC 0.0 0.0 - 0.2 %    Comment: Performed at [facility] [address] [place], [place] [address]  CBC     Status: Abnormal   Collection Time: 12/13/19  3:23 AM  Result Value Ref Range   WBC 11.5 (H) 4.0 - 10.5 K/uL   RBC 2.82 (L) 3.87 - 5.11 MIL/uL   Hemoglobin 8.5 (L) 12.0 - 15.0 g/dL   HCT 25.6 (L) 36 - 46 %   MCV 90.8 80.0 - 100.0 fL   MCH 30.1 26.0 - 34.0 pg   MCHC 33.2 30.0 - 36.0 g/dL   RDW 14.7 11.5 - 15.5 %   Platelets 154 150 - 400 K/uL   nRBC 0.0 0.0 - 0.2 %    Comment: Performed at [facility], [place] [address] [place], [place] [address]  Basic metabolic panel     Status: Abnormal   Collection Time: 12/13/19  3:23 AM  Result Value Ref Range   Sodium 132 (L) 135 - 145 mmol/L   Potassium 3.8 3.5 - 5.1 mmol/L   Chloride 106 98 - 111 mmol/L   CO2 21 (L) 22 - 32 mmol/L   Glucose, Bld 106 (H) 70 - 99 mg/dL    Comment: Glucose reference range applies only to samples taken after fasting for at least 8 hours.   BUN 24 (H) 8 - 23 mg/dL   Creatinine, Ser 1.26 (H) 0.44 - 1.00 mg/dL   Calcium 7.2 (L) 8.9 - 10.3 mg/dL   GFR calc non Af Amer 37 (L) >60 mL/min   GFR calc Af Amer 43 (L) >60 mL/min   Anion gap 5 5 - 15    Comment: Performed at [facility] [address] [place], [place] [address]   *Note: Due to a large number of results and/or encounters for the requested time period, some results have not been displayed. A complete set of results can be found in Results Review.   VAS  [place] LOWER EXTREMITY ARTERIAL DUPLEX  Result Date: 12/12/2019 LOWER EXTREMITY ARTERIAL DUPLEX STUDY Indications: Check patency of graft - unreliable ABI. High Risk Factors: Hypertension.  Vascular Interventions: Right redo fem-pop bypass graft and left endarterectomy                         common femoral on 12/09/2019. History of left fem-pop                         bypass graft in 2006. Current ABI:            Right 0.75, left 1.0 on 12/10/19. Performing Technologist: Oda Cogan RDMS, RVT  Examination Guidelines: A complete evaluation includes B-mode imaging, spectral Doppler, color Doppler, and power Doppler as needed of all accessible portions of each vessel. Bilateral testing is considered an integral part of a complete examination. Limited examinations for reoccurring indications may be performed as noted.  +-----------+--------+-----+--------+--------+--------+ RIGHT      PSV cm/sRatioStenosisWaveformComments +-----------+--------+-----+--------+--------+--------+ ATA Distal 120                                   +-----------+--------+-----+--------+--------+--------+  PTA Distal 54                                    +-----------+--------+-----+--------+--------+--------+ PERO Distal69                                    +-----------+--------+-----+--------+--------+--------+  Right Graft #1: Fem-pop +------------------+--------+--------+----------+--------+                   PSV cm/sStenosisWaveform  Comments +------------------+--------+--------+----------+--------+ Inflow            122             biphasic           +------------------+--------+--------+----------+--------+ Prox Anastomosis  80              monophasic         +------------------+--------+--------+----------+--------+ Proximal Graft    54              monophasic         +------------------+--------+--------+----------+--------+ Mid Graft         54              monophasic          +------------------+--------+--------+----------+--------+ Distal Graft      64              monophasic         +------------------+--------+--------+----------+--------+ Distal Anastomosis56              monophasic         +------------------+--------+--------+----------+--------+ Outflow           86              monophasic         +------------------+--------+--------+----------+--------+   +-----------+--------+-----+--------+--------+--------+ LEFT       PSV cm/sRatioStenosisWaveformComments +-----------+--------+-----+--------+--------+--------+ POP Distal 67                                    +-----------+--------+-----+--------+--------+--------+ ATA Distal 90                                    +-----------+--------+-----+--------+--------+--------+ PTA Distal 31                                    +-----------+--------+-----+--------+--------+--------+ PERO Distal93                                    +-----------+--------+-----+--------+--------+--------+  Left Graft #1: +--------------------+--------+--------+--------+--------+                     PSV cm/sStenosisWaveformComments +--------------------+--------+--------+--------+--------+ Inflow              107             biphasic         +--------------------+--------+--------+--------+--------+ Proximal Anastomosis113             biphasic         +--------------------+--------+--------+--------+--------+ Proximal Graft      56  biphasic         +--------------------+--------+--------+--------+--------+ Mid Graft           79              biphasic         +--------------------+--------+--------+--------+--------+ Distal Graft        75              biphasic         +--------------------+--------+--------+--------+--------+ Distal Anastomosis  80              biphasic         +--------------------+--------+--------+--------+--------+ Outflow              67              biphasic         +--------------------+--------+--------+--------+--------+   Summary: Right: Patent right femoral-popliteal bypass graft without significant stenosis. Left: Patent left femoral-popliteal bypass graft without significant stenosis.  See table(s) above for measurements and observations. Electronically signed by Servando Snare MD on 12/12/2019 at 2:48:04 PM.    Final        Medical Problem List and Plan: 1.  Decreased functional mobility secondary to PVD status post redo right femoral to below-knee popliteal bypass as well as left common femoral enterectomy 12/09/2019.  Follow-up vascular surgery Dr. Oneida Alar  -patient may shower covering right lower extremity  -ELOS/Goals: 7-10 days/supervision/mod I  Admit to CIR 2.  Antithrombotics: -DVT/anticoagulation: Eliquis  -antiplatelet therapy: Aspirin 81 mg daily 3.  Failed back syndrome/pain Management:  Oxycodone as needed  Monitor with increased exertion 4. Mood: Provide emotional support  -antipsychotic agents: N/A 5. Neuropsych: This patient is capable of making decisions on her own behalf. 6. Skin/Wound Care: Routine skin checks 7. Fluids/Electrolytes/Nutrition: Routine in and outs.  CMP ordered. 8.  Acute blood loss anemia.  CBC ordered. 9.  Rectal bleeding .  Follow-up GI.  CT of abdomen consistent with ischemic colitis.  CBC ordered  Cont IV Ancef 10.  Atrial fibrillation RVR.  Follow-up cardiology services.. Continue Toprol-XL 50 mg daily  Monitor with increased exertion. 11.  GERD.  Protonix 12.  COPD.  Check oxygen saturations every shift 13.  Hyperlipidemia. Lipitor 14.  Trigeminal neuralgia.  Maintained on Lamictal 100 mg twice daily per neurology services  Cathlyn Parsons, PA-C 12/13/2019  I have personally performed a face to face diagnostic evaluation, including, but not limited to relevant history and physical exam findings, of this patient and developed relevant assessment and plan.   Additionally, I have reviewed and concur with the physician assistant's documentation above.  Delice Lesch, MD, ABPMR

## 2019-12-12 NOTE — Progress Notes (Addendum)
Progress Note  Patient Name: ALIZON SCHMELING Date of Encounter: 12/12/2019  [facility] Cardiologist: Dr Oval Linsey  Subjective   No CP or dyspnea  Inpatient Medications    Scheduled Meds: . sodium chloride   Intravenous Once  . aspirin EC  81 mg Oral QPM  . atorvastatin  40 mg Oral q1800  . baclofen  5 mg Oral BID  . Chlorhexidine Gluconate Cloth  6 each Topical Q0600  . docusate sodium  100 mg Oral Daily  . furosemide  20 mg Intravenous Once  . lamoTRIgine  100 mg Oral BID  . metoprolol succinate  50 mg Oral Daily  . mupirocin ointment  1 application Nasal BID  . pantoprazole  40 mg Oral BID   Continuous Infusions: .  ceFAZolin (ANCEF) IV 2 g (12/12/19 0636)  . diltiazem (CARDIZEM) infusion 5 mg/hr (12/09/19 1801)  . heparin 950 Units/hr (12/12/19 0350)  . magnesium sulfate bolus IVPB    . metronidazole Stopped (12/12/19 0559)  . potassium PHOSPHATE IVPB (in mmol)     PRN Meds: acetaminophen **OR** acetaminophen, alum & mag hydroxide-simeth, bisacodyl, guaiFENesin-dextromethorphan, hydrALAZINE, labetalol, magnesium sulfate bolus IVPB, morphine injection, ondansetron, oxyCODONE-acetaminophen, phenol, potassium chloride, senna-docusate   Vital Signs    Vitals:   12/11/19 2200 12/12/19 0053 12/12/19 0441 12/12/19 0800  BP: (!) 98/53 98/61 (!) 94/54 112/64  Pulse: (!) 102 71 92 95  Resp: (!) 21 18 (!) 21 (!) 24  Temp:  97.8 F (36.6 C) 97.9 F (36.6 C) (!) 97.5 F (36.4 C)  TempSrc:  Oral Oral Oral  SpO2: 96% 98% 97% 99%  Weight:      Height:        Intake/Output Summary (Last 24 hours) at 12/12/2019 0853 Last data filed at 12/12/2019 4098 Gross per 24 hour  Intake 2706.11 ml  Output 600 ml  Net 2106.11 ml   Last 3 Weights 12/09/2019 12/03/2019 11/30/2019  Weight (lbs) 164 lb 3.2 oz 164 lb 3.2 oz 162 lb 9.6 oz  Weight (kg) 74.481 kg 74.481 kg 73.755 kg      Telemetry    Atrial fibrillation- Personally Reviewed  Physical Exam   GEN: No acute  distress.   Neck: No JVD Cardiac: irregular Respiratory: Clear to auscultation bilaterally. GI: Soft, nontender, non-distended  MS: 1+ edema; s/p surgery Neuro:  Nonfocal  Psych: Normal affect   Labs    High Sensitivity Troponin:   Recent Labs  Lab 12/09/19 2230 12/10/19 0523  TROPONINIHS 5 8      Chemistry Recent Labs  Lab 12/09/19 0637 12/09/19 0840 12/09/19 2033 12/10/19 1556 12/11/19 0024  NA 142   < > 138 138 135  K 4.0   < > 4.5 4.3 4.1  CL 107   < > 108 107 105  CO2 26   < > 20* 18* 23  GLUCOSE 112*   < > 172* 149* 140*  BUN 29*   < > 22 25* 23  CREATININE 1.18*   < > 1.18* 1.23* 1.32*  CALCIUM 9.3   < > 7.9* 7.6* 7.2*  PROT 6.1*  --  4.5* 4.7*  --   ALBUMIN 3.9  --  2.9* 2.8*  --   AST 18  --  21 22  --   ALT 12  --  14 9  --   ALKPHOS 87  --  44 43  --   BILITOT 0.6  --  0.6 0.7  --   GFRNONAA 40*   < >  40* 38* 35*  GFRAA 46*   < > 46* 44* 40*  ANIONGAP 9   < > 10 13 7    < > = values in this interval not displayed.     Hematology Recent Labs  Lab 12/10/19 0400 12/10/19 1009 12/10/19 1556 12/11/19 0024 12/12/19 0251  WBC 17.5*  --   --  16.8* 12.6*  RBC 3.74*  --   --  3.37* 2.68*  HGB 11.1*   < > 11.6* 10.1* 8.0*  HCT 33.3*   < > 36.2 30.9* 24.6*  MCV 89.0  --   --  91.7 91.8  MCH 29.7  --   --  30.0 29.9  MCHC 33.3  --   --  32.7 32.5  RDW 14.4  --   --  14.7 15.0  PLT 128*  --   --  120* 102*   < > = values in this interval not displayed.    Patient Profile     SHANEEQUA BAHNER is a 86 y.o. female with a hx of PAD s/p redo right fem-pop bypass grafting 12/09/19, HLD, HTN, COPD and persistent atrial fibrillation on PTA Coumadin who is being seen for the evaluation of AF with RVR at the request of Dr. Oneida Alar. Patient presented for surgical intervention for redo right femoral to below-knee popliteal bypass. Patient then developed rectal bleeding secondary to acute colitis.  Cardiology asked to evaluate for atrial fibrillation with rapid  ventricular response.  Assessment & Plan    1 Atrial fibrillation with rapid ventricular response-patient's heart rate is better controlled today.  Continue Toprol at present dose.  Hemoglobin has decreased to 8.  She denies melena or hematochezia.  Given continued decrease I would favor holding anticoagulation today and rechecking hemoglobin tomorrow.  If stable could potentially resume anticoagulation at that time.  Would resume heparin for 24 to 48 hours and if hemoglobin remains stable will treat with apixaban long-term instead of Coumadin.  2 status post peripheral vascular surgery-Per Dr. Oneida Alar.  3 acute blood loss anemia-likely combination of recent surgery in addition to GI bleed from ischemic colitis.  Hemoglobin now decreased to 8.  Would favor holding heparin as outlined above for 24 hours and following hemoglobin.  4 hypertension-blood pressure is borderline.  We will continue Toprol.  Irbesartan has been held.  5 volume excess-agree with low-dose Lasix and follow clinically.  She is +10.6 L since admission.  For questions or updates, please contact [place] Please consult [email or web address] for contact info under        Signed, Kirk Ruths, MD  12/12/2019, 8:53 AM

## 2019-12-12 NOTE — Telephone Encounter (Signed)
I am prescribing her lamotrigine for her trigeminal neuralgia.  I never prescribed her baclofen.  She was prescribed baclofen by another provider for back pain.  However, it appeared to have been discontinued at some point.  If her doctor has concerns about the lamotrigine, he should probably contact me to discuss this matter.

## 2019-12-12 NOTE — Telephone Encounter (Signed)
Follow up appointment scheduled. Letter mailed with the appointment information.

## 2019-12-12 NOTE — Telephone Encounter (Signed)
Per pt daughter pt is in the ED for post Femoral Popliteal bypass surgery. The attending physician had some question about the pt medication she is taking.  Per the daughter the pt states she is taking the Baclofen and Lamictal together.  Daughter assumed pt was supposed to stop taking the Baclofen and start the Lamictal 100 mg.  Pt loopy on both.  2.) the Lamictal is causing the pt heart rate to increase and her bp to drop.  3.) Pt will be in the hospital for a 3-4 weeks and wanted to know if she should continue the medication.

## 2019-12-12 NOTE — Progress Notes (Signed)
Mobility Specialist - Progress Note   12/12/19 1458  Mobility  Activity Transferred to/from [facility];Transferred:  Chair to bed  Level of Assistance Minimal assist, patient does 75% or more  Assistive Device Front wheel walker  Distance Ambulated (ft) 5 ft  Mobility Response Tolerated well  Mobility performed by Mobility specialist  $Mobility charge 1 Mobility    Pre-mobility: 101 HR, 119/72 BP, 97% SpO2 Post-mobility: 106 HR, 111/76 BP, 97% SpO2  Pt required assistance for set-up and bed mobility, she otherwise only required a contact guard.   Pricilla Handler Mobility Specialist Mobility Specialist Phone: [phone]

## 2019-12-12 NOTE — Progress Notes (Signed)
[facility]  Subjective  - feels a little better   Objective (!) 94/54 92 97.9 F (36.6 C) (Oral) (!) 21 97%  Intake/Output Summary (Last 24 hours) at 12/12/2019 1610 Last data filed at 12/12/2019 9604 Gross per 24 hour  Intake 2706.11 ml  Output 600 ml  Net 2106.11 ml   DP PT doppler bilat Some wheezing this morning Anasarca hands feet  Assessment/Planning: Acute blood loss anemia watch for now if continues to drift down or clinically bleeding will stop heparin  Ischemic colitis antibiotics per GI follow up colonoscopy in a few weeks  Rapid afib improved rate control per Cardiology recs  Need to ambulate today hopefully rehab next week  Duplex bypass grafts today  [name] 12/12/2019 7:52 AM --  Laboratory Lab Results: Recent Labs    12/11/19 0024 12/12/19 0251  WBC 16.8* 12.6*  HGB 10.1* 8.0*  HCT 30.9* 24.6*  PLT 120* 102*   BMET Recent Labs    12/10/19 1556 12/11/19 0024  NA 138 135  K 4.3 4.1  CL 107 105  CO2 18* 23  GLUCOSE 149* 140*  BUN 25* 23  CREATININE 1.23* 1.32*  CALCIUM 7.6* 7.2*    COAG Lab Results  Component Value Date   INR 1.0 12/09/2019   INR 1.2 11/29/2019   INR 1.8 (A) 11/21/2019   No results found for: PTT

## 2019-12-12 NOTE — Progress Notes (Signed)
[place] for Heparin Indication: atrial fibrillation   Allergies  Allergen Reactions  . Sulfa Antibiotics Other (See Comments)    Cold sweat light headed and disorientation  . Tiotropium Bromide Shortness Of Breath and Other (See Comments)    Sore throat also  . Latex Itching and Rash  . Ultram [Tramadol] Nausea And Vomiting  . 5-Alpha Reductase Inhibitors     Patient Measurements: Height: 4\' 11"  (149.9 cm) Weight: 74.5 kg (164 lb 3.2 oz) IBW/kg (Calculated) : 43.2 Heparin Dosing Weight: 60.1  Vital Signs: Temp: 97.8 F (36.6 C) (08/19 0053) Temp Source: Oral (08/19 0053) BP: 98/61 (08/19 0053) Pulse Rate: 71 (08/19 0053)  Labs: Recent Labs    12/09/19 0637 12/09/19 0840 12/09/19 2033 12/09/19 2033 12/09/19 2230 12/10/19 0400 12/10/19 0400 12/10/19 0523 12/10/19 1009 12/10/19 1009 12/10/19 1556 12/11/19 0024 12/11/19 2216  HGB 13.1   < > 11.6*   < >  --  11.1*   < >  --  11.4*   < > 11.6* 10.1*  --   HCT 42.1   < > 34.8*   < >  --  33.3*   < >  --  33.4*  --  36.2 30.9*  --   PLT 209   < > 135*  --   --  128*  --   --   --   --   --  120*  --   APTT 28  --   --   --   --   --   --   --   --   --   --   --   --   LABPROT 12.4  --   --   --   --   --   --   --   --   --   --   --   --   INR 1.0  --   --   --   --   --   --   --   --   --   --   --   --   HEPARINUNFRC  --   --   --   --   --   --   --   --   --   --   --   --  <0.10*  CREATININE 1.18*   < > 1.18*  --   --   --   --   --   --   --  1.23* 1.32*  --   TROPONINIHS  --   --   --   --  5  --   --  8  --   --   --   --   --    < > = values in this interval not displayed.    Estimated Creatinine Clearance: 23.4 mL/min (A) (by C-G formula based on SCr of 1.32 mg/dL (H)).  Assessment: 86 y.o. female with h/o Afib for heparin  Goal of Therapy:  Heparin level 0.3-0.7 units/ml Monitor platelets by anticoagulation protocol: Yes   Plan:  Increase Heparin 950  units/hr Follow-up am labs.  Phillis Knack, PharmD, BCPS  12/12/2019 3:32 AM

## 2019-12-12 NOTE — Telephone Encounter (Signed)
Telephone call back to pt daughter, advised her dr. Tomi Likens note.

## 2019-12-13 ENCOUNTER — Inpatient Hospital Stay (HOSPITAL_COMMUNITY)
Admission: RE | Admit: 2019-12-13 | Discharge: 2019-12-24 | DRG: 092 | Disposition: A | Discharge status: Home Health | Payer: Medicare Other | Source: Intra-hospital | Attending: Physical Medicine & Rehabilitation | Admitting: Physical Medicine & Rehabilitation

## 2019-12-13 ENCOUNTER — Other Ambulatory Visit: Payer: Self-pay | Admitting: Cardiology

## 2019-12-13 ENCOUNTER — Encounter (HOSPITAL_COMMUNITY): Payer: Self-pay | Admitting: Physical Medicine & Rehabilitation

## 2019-12-13 ENCOUNTER — Other Ambulatory Visit: Payer: Self-pay

## 2019-12-13 DIAGNOSIS — E785 Hyperlipidemia, unspecified: Secondary | ICD-10-CM | POA: Diagnosis present

## 2019-12-13 DIAGNOSIS — K219 Gastro-esophageal reflux disease without esophagitis: Secondary | ICD-10-CM | POA: Diagnosis present

## 2019-12-13 DIAGNOSIS — R609 Edema, unspecified: Secondary | ICD-10-CM | POA: Diagnosis not present

## 2019-12-13 DIAGNOSIS — R5381 Other malaise: Secondary | ICD-10-CM | POA: Diagnosis present

## 2019-12-13 DIAGNOSIS — Z95828 Presence of other vascular implants and grafts: Secondary | ICD-10-CM

## 2019-12-13 DIAGNOSIS — K559 Vascular disorder of intestine, unspecified: Secondary | ICD-10-CM

## 2019-12-13 DIAGNOSIS — R195 Other fecal abnormalities: Secondary | ICD-10-CM | POA: Diagnosis not present

## 2019-12-13 DIAGNOSIS — G500 Trigeminal neuralgia: Secondary | ICD-10-CM | POA: Diagnosis present

## 2019-12-13 DIAGNOSIS — E669 Obesity, unspecified: Secondary | ICD-10-CM | POA: Diagnosis present

## 2019-12-13 DIAGNOSIS — R600 Localized edema: Secondary | ICD-10-CM

## 2019-12-13 DIAGNOSIS — I779 Disorder of arteries and arterioles, unspecified: Secondary | ICD-10-CM

## 2019-12-13 DIAGNOSIS — Z981 Arthrodesis status: Secondary | ICD-10-CM

## 2019-12-13 DIAGNOSIS — I739 Peripheral vascular disease, unspecified: Secondary | ICD-10-CM | POA: Diagnosis present

## 2019-12-13 DIAGNOSIS — Z91040 Latex allergy status: Secondary | ICD-10-CM

## 2019-12-13 DIAGNOSIS — J449 Chronic obstructive pulmonary disease, unspecified: Secondary | ICD-10-CM | POA: Diagnosis present

## 2019-12-13 DIAGNOSIS — I10 Essential (primary) hypertension: Secondary | ICD-10-CM | POA: Diagnosis present

## 2019-12-13 DIAGNOSIS — Z7901 Long term (current) use of anticoagulants: Secondary | ICD-10-CM | POA: Diagnosis not present

## 2019-12-13 DIAGNOSIS — Z79899 Other long term (current) drug therapy: Secondary | ICD-10-CM

## 2019-12-13 DIAGNOSIS — N390 Urinary tract infection, site not specified: Secondary | ICD-10-CM

## 2019-12-13 DIAGNOSIS — R2689 Other abnormalities of gait and mobility: Principal | ICD-10-CM | POA: Diagnosis present

## 2019-12-13 DIAGNOSIS — D72829 Elevated white blood cell count, unspecified: Secondary | ICD-10-CM | POA: Diagnosis not present

## 2019-12-13 DIAGNOSIS — Z888 Allergy status to other drugs, medicaments and biological substances status: Secondary | ICD-10-CM

## 2019-12-13 DIAGNOSIS — K625 Hemorrhage of anus and rectum: Secondary | ICD-10-CM

## 2019-12-13 DIAGNOSIS — D62 Acute posthemorrhagic anemia: Secondary | ICD-10-CM | POA: Diagnosis present

## 2019-12-13 DIAGNOSIS — Z87891 Personal history of nicotine dependence: Secondary | ICD-10-CM

## 2019-12-13 DIAGNOSIS — J309 Allergic rhinitis, unspecified: Secondary | ICD-10-CM | POA: Diagnosis present

## 2019-12-13 DIAGNOSIS — E871 Hypo-osmolality and hyponatremia: Secondary | ICD-10-CM | POA: Diagnosis present

## 2019-12-13 DIAGNOSIS — Z882 Allergy status to sulfonamides status: Secondary | ICD-10-CM

## 2019-12-13 DIAGNOSIS — I480 Paroxysmal atrial fibrillation: Secondary | ICD-10-CM | POA: Diagnosis present

## 2019-12-13 DIAGNOSIS — F411 Generalized anxiety disorder: Secondary | ICD-10-CM

## 2019-12-13 DIAGNOSIS — K5900 Constipation, unspecified: Secondary | ICD-10-CM | POA: Diagnosis present

## 2019-12-13 DIAGNOSIS — Z9049 Acquired absence of other specified parts of digestive tract: Secondary | ICD-10-CM

## 2019-12-13 DIAGNOSIS — Z6836 Body mass index (BMI) 36.0-36.9, adult: Secondary | ICD-10-CM

## 2019-12-13 DIAGNOSIS — I4891 Unspecified atrial fibrillation: Secondary | ICD-10-CM | POA: Diagnosis not present

## 2019-12-13 DIAGNOSIS — D500 Iron deficiency anemia secondary to blood loss (chronic): Secondary | ICD-10-CM

## 2019-12-13 DIAGNOSIS — R251 Tremor, unspecified: Secondary | ICD-10-CM | POA: Diagnosis not present

## 2019-12-13 DIAGNOSIS — M961 Postlaminectomy syndrome, not elsewhere classified: Secondary | ICD-10-CM

## 2019-12-13 DIAGNOSIS — J9811 Atelectasis: Secondary | ICD-10-CM | POA: Diagnosis present

## 2019-12-13 DIAGNOSIS — F40240 Claustrophobia: Secondary | ICD-10-CM | POA: Diagnosis present

## 2019-12-13 DIAGNOSIS — R0602 Shortness of breath: Secondary | ICD-10-CM

## 2019-12-13 DIAGNOSIS — Z7982 Long term (current) use of aspirin: Secondary | ICD-10-CM

## 2019-12-13 DIAGNOSIS — Z885 Allergy status to narcotic agent status: Secondary | ICD-10-CM

## 2019-12-13 DIAGNOSIS — Z8249 Family history of ischemic heart disease and other diseases of the circulatory system: Secondary | ICD-10-CM

## 2019-12-13 DIAGNOSIS — J90 Pleural effusion, not elsewhere classified: Secondary | ICD-10-CM | POA: Diagnosis not present

## 2019-12-13 DIAGNOSIS — F419 Anxiety disorder, unspecified: Secondary | ICD-10-CM | POA: Diagnosis present

## 2019-12-13 LAB — BASIC METABOLIC PANEL
Anion gap: 5 (ref 5–15)
BUN: 24 mg/dL — ABNORMAL HIGH (ref 8–23)
CO2: 21 mmol/L — ABNORMAL LOW (ref 22–32)
Calcium: 7.2 mg/dL — ABNORMAL LOW (ref 8.9–10.3)
Chloride: 106 mmol/L (ref 98–111)
Creatinine, Ser: 1.26 mg/dL — ABNORMAL HIGH (ref 0.44–1.00)
GFR calc Af Amer: 43 mL/min — ABNORMAL LOW (ref >60)
GFR calc non Af Amer: 37 mL/min — ABNORMAL LOW (ref >60)
Glucose, Bld: 106 mg/dL — ABNORMAL HIGH (ref 70–99)
Potassium: 3.8 mmol/L (ref 3.5–5.1)
Sodium: 132 mmol/L — ABNORMAL LOW (ref 135–145)

## 2019-12-13 LAB — URINE CULTURE: Culture: 80000 — AB

## 2019-12-13 LAB — CBC
HCT: 25.6 % — ABNORMAL LOW (ref 36.0–46.0)
Hemoglobin: 8.5 g/dL — ABNORMAL LOW (ref 12.0–15.0)
MCH: 30.1 pg (ref 26.0–34.0)
MCHC: 33.2 g/dL (ref 30.0–36.0)
MCV: 90.8 fL (ref 80.0–100.0)
Platelets: 154 10*3/uL (ref 150–400)
RBC: 2.82 MIL/uL — ABNORMAL LOW (ref 3.87–5.11)
RDW: 14.7 % (ref 11.5–15.5)
WBC: 11.5 10*3/uL — ABNORMAL HIGH (ref 4.0–10.5)
nRBC: 0 % (ref 0.0–0.2)

## 2019-12-13 MED ORDER — HEPARIN (PORCINE) 25000 UT/250ML-% IV SOLN
1150.0000 [IU]/h | INTRAVENOUS | Status: DC
  Administered 2019-12-13: 1150 [IU]/h via INTRAVENOUS
  Filled 2019-12-13 (×2): qty 250

## 2019-12-13 MED ORDER — HEPARIN (PORCINE) 25000 UT/250ML-% IV SOLN: 1150.0000 [IU]/h | INTRAVENOUS | Status: DC

## 2019-12-13 MED ORDER — CEFAZOLIN SODIUM-DEXTROSE 2-4 GM/100ML-% IV SOLN
2.0000 g | Freq: Two times a day (BID) | INTRAVENOUS | Status: AC
  Administered 2019-12-13 – 2019-12-14 (×3): 2 g via INTRAVENOUS
  Filled 2019-12-13 (×3): qty 100

## 2019-12-13 MED ORDER — ACETAMINOPHEN 650 MG RE SUPP: 325.0000 mg | RECTAL | Status: DC | PRN

## 2019-12-13 MED ORDER — ACETAMINOPHEN 325 MG PO TABS
325.0000 mg | ORAL_TABLET | ORAL | Status: DC | PRN
  Administered 2019-12-18: 650 mg via ORAL
  Filled 2019-12-13: qty 2

## 2019-12-13 MED ORDER — APIXABAN 5 MG PO TABS
5.0000 mg | ORAL_TABLET | Freq: Two times a day (BID) | ORAL | Status: DC
  Administered 2019-12-13 – 2019-12-24 (×22): 5 mg via ORAL
  Filled 2019-12-13 (×22): qty 1

## 2019-12-13 MED ORDER — LAMOTRIGINE 100 MG PO TABS
100.0000 mg | ORAL_TABLET | Freq: Two times a day (BID) | ORAL | Status: DC
  Administered 2019-12-13 – 2019-12-24 (×22): 100 mg via ORAL
  Filled 2019-12-13 (×24): qty 1

## 2019-12-13 MED ORDER — METOPROLOL SUCCINATE ER 50 MG PO TB24
50.0000 mg | ORAL_TABLET | Freq: Every day | ORAL | Status: DC
  Administered 2019-12-14 – 2019-12-15 (×2): 50 mg via ORAL
  Filled 2019-12-13 (×2): qty 1

## 2019-12-13 MED ORDER — OXYCODONE-ACETAMINOPHEN 5-325 MG PO TABS
1.0000 | ORAL_TABLET | ORAL | Status: DC | PRN
  Administered 2019-12-13 – 2019-12-16 (×4): 1 via ORAL
  Filled 2019-12-13 (×5): qty 1

## 2019-12-13 MED ORDER — ATORVASTATIN CALCIUM 40 MG PO TABS
40.0000 mg | ORAL_TABLET | Freq: Every day | ORAL | Status: DC
  Administered 2019-12-13 – 2019-12-23 (×11): 40 mg via ORAL
  Filled 2019-12-13 (×11): qty 1

## 2019-12-13 MED ORDER — DOCUSATE SODIUM 100 MG PO CAPS
100.0000 mg | ORAL_CAPSULE | Freq: Every day | ORAL | Status: DC
  Administered 2019-12-14 – 2019-12-15 (×2): 100 mg via ORAL
  Filled 2019-12-13 (×3): qty 1

## 2019-12-13 MED ORDER — ASPIRIN EC 81 MG PO TBEC
81.0000 mg | DELAYED_RELEASE_TABLET | Freq: Every evening | ORAL | Status: DC
  Administered 2019-12-13 – 2019-12-23 (×11): 81 mg via ORAL
  Filled 2019-12-13 (×11): qty 1

## 2019-12-13 MED ORDER — PANTOPRAZOLE SODIUM 40 MG PO TBEC
40.0000 mg | DELAYED_RELEASE_TABLET | Freq: Two times a day (BID) | ORAL | Status: DC
  Administered 2019-12-13 – 2019-12-24 (×22): 40 mg via ORAL
  Filled 2019-12-13 (×22): qty 1

## 2019-12-13 MED ORDER — METRONIDAZOLE IN NACL 5-0.79 MG/ML-% IV SOLN
500.0000 mg | Freq: Three times a day (TID) | INTRAVENOUS | Status: AC
  Administered 2019-12-13 – 2019-12-15 (×4): 500 mg via INTRAVENOUS
  Filled 2019-12-13 (×5): qty 100

## 2019-12-13 NOTE — Progress Notes (Signed)
Patient arrived on unit, oriented to unit. Reviewed medications, therapy schedule, rehab routine and plan of care. States an understanding of information reviewed. No complications noted at this time. Patient reports no pain and is AX4 [name] [name]  

## 2019-12-13 NOTE — Progress Notes (Signed)
Progress Note  Patient Name: [name] [name] Date of Encounter: 12/13/2019  [facility] Cardiologist: Dr Oval Linsey  Subjective   No CP or dyspnea, she is working with PT and feels great.  Inpatient Medications    Scheduled Meds: . sodium chloride   Intravenous Once  . aspirin EC  81 mg Oral QPM  . atorvastatin  40 mg Oral q1800  . baclofen  5 mg Oral BID  . Chlorhexidine Gluconate Cloth  6 each Topical Q0600  . docusate sodium  100 mg Oral Daily  . lamoTRIgine  100 mg Oral BID  . metoprolol succinate  50 mg Oral Daily  . mupirocin ointment  1 application Nasal BID  . pantoprazole  40 mg Oral BID   Continuous Infusions: .  ceFAZolin (ANCEF) IV 2 g (12/13/19 0922)  . heparin    . magnesium sulfate bolus IVPB    . metronidazole 500 mg (12/13/19 0444)   PRN Meds: acetaminophen **OR** acetaminophen, alum & mag hydroxide-simeth, bisacodyl, guaiFENesin-dextromethorphan, hydrALAZINE, labetalol, magnesium sulfate bolus IVPB, morphine injection, ondansetron, oxyCODONE-acetaminophen, phenol, potassium chloride, senna-docusate   Vital Signs    Vitals:   12/12/19 1958 12/12/19 2307 12/13/19 0332 12/13/19 0723  BP: (!) 103/52 111/65 (!) 97/55 (!) 115/57  Pulse: 94 100 90 (!) 103  Resp: 18 19 20  (!) 23  Temp: 97.9 F (36.6 C) 98.2 F (36.8 C) 98.2 F (36.8 C) 97.8 F (36.6 C)  TempSrc: Oral Oral Oral Oral  SpO2: 98% 98% 95% 97%  Weight:      Height:        Intake/Output Summary (Last 24 hours) at 12/13/2019 1002 Last data filed at 12/13/2019 0444 Gross per 24 hour  Intake 660 ml  Output 900 ml  Net -240 ml   Last 3 Weights 12/09/2019 12/03/2019 11/30/2019  Weight (lbs) 164 lb 3.2 oz 164 lb 3.2 oz 162 lb 9.6 oz  Weight (kg) 74.481 kg 74.481 kg 73.755 kg      Telemetry    Atrial fibrillation- Personally Reviewed  Physical Exam   GEN: No acute distress.   Neck: No JVD Cardiac: irregular Respiratory: Clear to auscultation bilaterally. GI: Soft, nontender,  non-distended  MS: 1+ edema; s/p surgery Neuro:  Nonfocal  Psych: Normal affect   Labs    High Sensitivity Troponin:   Recent Labs  Lab 12/09/19 2230 12/10/19 0523  TROPONINIHS 5 8      Chemistry Recent Labs  Lab 12/09/19 0637 12/09/19 0840 12/09/19 2033 12/09/19 2033 12/10/19 1556 12/11/19 0024 12/13/19 0323  NA 142   < > 138   < > 138 135 132*  K 4.0   < > 4.5   < > 4.3 4.1 3.8  CL 107   < > 108   < > 107 105 106  CO2 26   < > 20*   < > 18* 23 21*  GLUCOSE 112*   < > 172*   < > 149* 140* 106*  BUN 29*   < > 22   < > 25* 23 24*  CREATININE 1.18*   < > 1.18*   < > 1.23* 1.32* 1.26*  CALCIUM 9.3   < > 7.9*   < > 7.6* 7.2* 7.2*  PROT 6.1*  --  4.5*  --  4.7*  --   --   ALBUMIN 3.9  --  2.9*  --  2.8*  --   --   AST 18  --  21  --  22  --   --  ALT 12  --  14  --  9  --   --   ALKPHOS 87  --  44  --  43  --   --   BILITOT 0.6  --  0.6  --  0.7  --   --   GFRNONAA 40*   < > 40*   < > 38* 35* 37*  GFRAA 46*   < > 46*   < > 44* 40* 43*  ANIONGAP 9   < > 10   < > 13 7 5    < > = values in this interval not displayed.     Hematology Recent Labs  Lab 12/11/19 0024 12/12/19 0251 12/13/19 0323  WBC 16.8* 12.6* 11.5*  RBC 3.37* 2.68* 2.82*  HGB 10.1* 8.0* 8.5*  HCT 30.9* 24.6* 25.6*  MCV 91.7 91.8 90.8  MCH 30.0 29.9 30.1  MCHC 32.7 32.5 33.2  RDW 14.7 15.0 14.7  PLT 120* 102* 154    Patient Profile     [name] [name] is a 86 y.o. female with a hx of PAD s/p redo right fem-pop bypass grafting 12/09/19, HLD, HTN, COPD and persistent atrial fibrillation on PTA Coumadin who is being seen for the evaluation of AF with RVR at the request of Dr. Oneida Alar. Patient presented for surgical intervention for redo right femoral to below-knee popliteal bypass. Patient then developed rectal bleeding secondary to acute colitis.  Cardiology asked to evaluate for atrial fibrillation with rapid ventricular response.  Assessment & Plan    1 Atrial fibrillation with rapid  ventricular response-patient's heart rate is better controlled today with ventricular rates in 90', up to low 100' on exertion.  Continue Toprol at present dose.  Hemoglobin increased today at 8.5.  She denies melena or hematochezia.  I would restart anticoagulation with Eliquis, d/c heparin.  2 status post peripheral vascular surgery-Per Dr. Oneida Alar.  3 acute blood loss anemia-likely combination of recent surgery in addition to GI bleed from ischemic colitis. Hb improved today.   4 hypertension-blood pressure is borderline.  We will continue Toprol.  Irbesartan has been held.  5 volume excess-agree with low-dose Lasix and follow clinically.  She is +10.6 L since admission.  She is stable from cardiac standpoint and can be transferred to the inpatient rehab.  For questions or updates, please contact [place] Please consult [email or web address] for contact info under     Signed, Ena Dawley, MD  12/13/2019, 10:02 AM

## 2019-12-13 NOTE — H&P (Signed)
Physical Medicine and Rehabilitation Admission H&P     HPI: [name] [name]. Kulik is a 86 year old right-handed female with history of hypertension, hyperlipidemia, failed back syndrome, paroxysmal atrial fibrillation maintained on chronic Coumadin followed by cardiology services Dr. Oval Linsey, COPD as well as peripheral vascular disease.  History taken from chart review and patient.  Patient lives alone.  Modified independent using straight point cane.  Performing all ADLs and mobility tasks.  1 level home with 2 steps to entry.  She does have a son and daughter in the area.  About 4 to 6 weeks ago patient was having difficulty with ambulation with increasing rest pain to the right foot.  She had previously undergone right femoral to above-knee popliteal bypass 2006 by [name] revised in 2010 as well as 1 prior thrombolysis procedure to the right leg.  She had an arteriogram in February 2020 that showed 80% narrowing of the midportion of her right femoral-popliteal bypass graft.  She also had a 70% stenosis of her left common femoral artery.  Patient was admitted 12/09/2019 and underwent redo right femoral to below-knee popliteal bypass as well as left common femoral enterectomy bovine pericardial patch on 12/09/2019 per Dr. Oneida Alar.  Postoperatively patient did develop some painless rectal bleeding 12/10/2019 with GI consulted Dr.Nandigmam and underwent CT of the abdomen findings consistent with ischemic colitis and close monitoring of hemoglobin 8.5 and does remain on intravenous Ancef.  Hospital course further complicated by atrial fibrillation with RVR, cardiology consulted, initially held for revascularization and initially placed on intravenous heparin held due to anemia and transitioned to [facility] 12/13/2019 and heparin  stopped. Therapy evaluations completed and patient was admitted for a comprehensive rehab program.  Please see preadmission assessment from earlier today as well.  Review of Systems    Constitutional: Positive for malaise/fatigue. Negative for chills and fever.  HENT: Negative for hearing loss.   Eyes: Negative for blurred vision and double vision.  Respiratory: Positive for shortness of breath. Negative for cough.        Shortness of breath with exertion  Cardiovascular: Positive for leg swelling.  Gastrointestinal: Positive for constipation. Negative for heartburn, nausea and vomiting.       GERD  Genitourinary: Negative for dysuria, flank pain and hematuria.  Musculoskeletal: Positive for back pain, joint pain and myalgias.  Skin: Negative for rash.  Neurological: Positive for weakness. Negative for sensory change and focal weakness.  Psychiatric/Behavioral: Positive for depression.       Anxiety  All other systems reviewed and are negative.  Past Medical History:  Diagnosis Date  . Allergic rhinoconjunctivitis   . Allergy   . Anxiety    pt. managed- uses deep breathing   . Arthritis    low back , stenosis  . COLONIC POLYPS, RECURRENT 08/29/2006   2008 last colonoscopy. No further colonoscopy.     Marland Kitchen COPD (chronic obstructive pulmonary disease) ([place])   . Diverticulosis   . Dysrhythmia    afib  . Eczema   . Environmental allergies    allergy shot- q friday in Dr. Janee Morn office. PFT's abnormal- recommended Spiriva to use preop & will d/c after surgery  . GERD (gastroesophageal reflux disease)   . Hiatal hernia   . Hyperlipidemia   . Hypertension   . Lung nodule 2011  . Paroxysmal atrial fibrillation ([place]) 11/27/2019  . Peripheral vascular disease ([place])   . Shingles   . Stenosis of popliteal artery ([facility])    blood clots in legs long  ago    . Trigeminal neuralgia    Left buttocks   Past Surgical History:  Procedure Laterality Date  . ABDOMINAL AORTAGRAM N/A 06/17/2011   Procedure: ABDOMINAL Maxcine Ham;  Surgeon: Elam Dutch, MD;  Location: [facility] CATH LAB;  Service: Cardiovascular;  Laterality: N/A;  . ABDOMINAL AORTOGRAM W/LOWER EXTREMITY Bilateral  06/22/2018   Procedure: ABDOMINAL AORTOGRAM W/LOWER EXTREMITY;  Surgeon: Elam Dutch, MD;  Location: [place] CV LAB;  Service: Cardiovascular;  Laterality: Bilateral;  . ABDOMINAL AORTOGRAM W/LOWER EXTREMITY Bilateral 11/29/2019   Procedure: ABDOMINAL AORTOGRAM W/LOWER EXTREMITY;  Surgeon: Elam Dutch, MD;  Location: [place] CV LAB;  Service: Cardiovascular;  Laterality: Bilateral;  . CATARACT EXTRACTION, BILATERAL     w IOL  . CHOLECYSTECTOMY  1998  . COLONOSCOPY    . ENDARTERECTOMY FEMORAL Left 12/09/2019   Procedure: ENDARTERECTOMY FEMORAL with bovine patch angioplasty.;  Surgeon: Elam Dutch, MD;  Location: [facility] OR;  Service: Vascular;  Laterality: Left;  . FEMORAL-POPLITEAL BYPASS GRAFT        x2 surgeries 1990's & 2009  . FEMORAL-POPLITEAL BYPASS GRAFT Right 12/09/2019   Procedure: REDO RIGHT FEMORAL-POPLITEAL ARTERY BYPASS GRAFT;  Surgeon: Elam Dutch, MD;  Location: [place];  Service: Vascular;  Laterality: Right;  . INJECTION KNEE Right Aug. 2016   Gel injection for pain  . LUMBAR FUSION  07/06/2011  . TONSILLECTOMY     as a teenager   . TUBAL LIGATION     Family History  Problem Relation Age of Onset  . Arthritis Mother   . Hypertension Mother   . Heart disease Father   . Colon polyps Father   . Breast cancer Sister   . Breast cancer Daughter   . Hypertension Son   . Lung cancer Brother   . Colon cancer Sister   . Brain cancer Sister   . Lung cancer Sister   . Anesthesia problems Neg Hx   . Hypotension Neg Hx   . Malignant hyperthermia Neg Hx   . Pseudochol deficiency Neg Hx    Social History:  reports that she quit smoking about 26 years ago. Her smoking use included cigarettes. She has a 60.00 pack-year smoking history. She has never used smokeless tobacco. She reports that she does not drink alcohol and does not use drugs. Allergies:  Allergies  Allergen Reactions  . Sulfa Antibiotics Other (See Comments)    Cold sweat light headed and  disorientation  . Tiotropium Bromide Shortness Of Breath and Other (See Comments)    Sore throat also  . Latex Itching and Rash  . Ultram [Tramadol] Nausea And Vomiting  . 5-Alpha Reductase Inhibitors    Medications Prior to Admission  Medication Sig Dispense Refill  . acetaminophen (TYLENOL) 500 MG tablet Take 1,000 mg by mouth 2 (two) times daily.     Marland Kitchen aspirin EC 81 MG tablet Take 81 mg by mouth every evening.     Marland Kitchen atorvastatin (LIPITOR) 40 MG tablet Take 1 tablet (40 mg total) by mouth daily. 90 tablet 3  . baclofen (LIORESAL) 10 MG tablet TAKE 1/2 (ONE-HALF) TABLET BY MOUTH ONCE DAILY NIGHTLY (Patient taking differently: 5 mg 2 (two) times daily. ) 30 tablet 2  . colchicine (COLCRYS) 0.6 MG tablet Take 2 pills at first sign gout flare, then another pill 2 hours later if still having pain. Then take once daily until pain resolves. 30 tablet 0  . hydrochlorothiazide (HYDRODIURIL) 25 MG tablet TAKE 1 TABLET BY MOUTH AT  BEDTIME 90 tablet 0  . irbesartan (AVAPRO) 300 MG tablet Take 1 tablet by mouth once daily 90 tablet 0  . lamoTRIgine (LAMICTAL) 100 MG tablet Take 1 tablet by mouth twice daily 60 tablet 5  . Menthol (ICY HOT) 5 % PTCH Apply 1 patch topically daily as needed (back pain).    . metoprolol succinate (TOPROL XL) 25 MG 24 hr tablet Take 1 tablet (25 mg total) by mouth daily. 90 tablet 1  . Multiple Vitamin (MULTIVITAMIN WITH MINERALS) TABS tablet Take 1 tablet by mouth daily.    . pantoprazole (PROTONIX) 40 MG tablet Take 1 tablet (40 mg total) by mouth 2 (two) times daily. 60 tablet 0  . RESTASIS 0.05 % ophthalmic emulsion Place 1 drop into both eyes 2 (two) times daily as needed (dry eyes).   99  . warfarin (COUMADIN) 5 MG tablet TAKE 1 TABLET BY MOUTH ONCE DAILY, EXCEPT TAKE 1 & 1/2 TABLETS ON MONDAY OR TAKE AS DIRECTED BY ANTICOAGULATION CLINIC (Patient taking differently: Take 5 mg by mouth every evening. ) 120 tablet 0    Drug Regimen Review Drug regimen was reviewed and  remains appropriate with no significant issues identified  Home: Home Living Family/patient expects to be discharged to:: Private residence Living Arrangements: Alone   Functional History:    Functional Status:  Mobility:          ADL:    Cognition: Cognition Orientation Level: Oriented X4    Physical Exam: Blood pressure 123/74, pulse (!) 113, temperature 98 F (36.7 C), temperature source Oral, resp. rate 19, height 4\' 11"  (1.499 m), weight 84.5 kg, SpO2 98 %. Physical Exam Vitals reviewed.  Constitutional:      General: She is not in acute distress.    Appearance: She is obese.  HENT:     Head: Normocephalic and atraumatic.     Right Ear: External ear normal.     Left Ear: External ear normal.     Nose: Nose normal.  Eyes:     General:        Right eye: No discharge.        Left eye: No discharge.     Extraocular Movements: Extraocular movements intact.  Cardiovascular:     Comments: Irregularly irregular Pulmonary:     Effort: Pulmonary effort is normal. No respiratory distress.     Breath sounds: No stridor.  Abdominal:     General: Abdomen is flat. Bowel sounds are normal. There is no distension.  Musculoskeletal:     Cervical back: Normal range of motion and neck supple.     Comments: Generalized edema Right lower extremity tenderness  Skin:    General: Skin is warm and dry.     Comments: Lower extremity revascularization sites CDI  Neurological:     Mental Status: She is alert.     Comments: Follows commands. Alert and oriented Motor: 4/5 throughout  Psychiatric:        Mood and Affect: Mood normal.        Behavior: Behavior normal.        Thought Content: Thought content normal.     Results for orders placed or performed during the hospital encounter of 12/09/19 (from the past 48 hour(s))  Heparin level (unfractionated)     Status: Abnormal   Collection Time: 12/11/19 10:16 PM  Result Value Ref Range   Heparin Unfractionated <0.10 (L)  0.30 - 0.70 IU/mL    Comment: (NOTE) If heparin results are below  expected values, and patient dosage has  been confirmed, suggest follow up testing of antithrombin III levels. Performed at [facility], [place] [address], [place], [name] [address]   Heparin level (unfractionated)     Status: Abnormal   Collection Time: 12/12/19  2:51 AM  Result Value Ref Range   Heparin Unfractionated 0.15 (L) 0.30 - 0.70 IU/mL    Comment: (NOTE) If heparin results are below expected values, and patient dosage has  been confirmed, suggest follow up testing of antithrombin III levels. Performed at [facility], [place] [address], [place], [name] [address]   CBC     Status: Abnormal   Collection Time: 12/12/19  2:51 AM  Result Value Ref Range   WBC 12.6 (H) 4.0 - 10.5 K/uL   RBC 2.68 (L) 3.87 - 5.11 MIL/uL   Hemoglobin 8.0 (L) 12.0 - 15.0 g/dL    Comment: REPEATED TO VERIFY   HCT 24.6 (L) 36 - 46 %   MCV 91.8 80.0 - 100.0 fL   MCH 29.9 26.0 - 34.0 pg   MCHC 32.5 30.0 - 36.0 g/dL   RDW 15.0 11.5 - 15.5 %   Platelets 102 (L) 150 - 400 K/uL    Comment: REPEATED TO VERIFY PLATELET COUNT CONFIRMED BY SMEAR SPECIMEN CHECKED FOR CLOTS Immature Platelet Fraction may be clinically indicated, consider ordering this additional test [id number]    nRBC 0.0 0.0 - 0.2 %    Comment: Performed at [facility], [facility] [address] [place], [place] [address]  CBC     Status: Abnormal   Collection Time: 12/13/19  3:23 AM  Result Value Ref Range   WBC 11.5 (H) 4.0 - 10.5 K/uL   RBC 2.82 (L) 3.87 - 5.11 MIL/uL   Hemoglobin 8.5 (L) 12.0 - 15.0 g/dL   HCT 25.6 (L) 36 - 46 %   MCV 90.8 80.0 - 100.0 fL   MCH 30.1 26.0 - 34.0 pg   MCHC 33.2 30.0 - 36.0 g/dL   RDW 14.7 11.5 - 15.5 %   Platelets 154 150 - 400 K/uL   nRBC 0.0 0.0 - 0.2 %    Comment: Performed at [facility], [place] [address] [place], [place] [address]  Basic metabolic panel     Status: Abnormal   Collection Time: 12/13/19  3:23  AM  Result Value Ref Range   Sodium 132 (L) 135 - 145 mmol/L   Potassium 3.8 3.5 - 5.1 mmol/L   Chloride 106 98 - 111 mmol/L   CO2 21 (L) 22 - 32 mmol/L   Glucose, Bld 106 (H) 70 - 99 mg/dL    Comment: Glucose reference range applies only to samples taken after fasting for at least 8 hours.   BUN 24 (H) 8 - 23 mg/dL   Creatinine, Ser 1.26 (H) 0.44 - 1.00 mg/dL   Calcium 7.2 (L) 8.9 - 10.3 mg/dL   GFR calc non Af Amer 37 (L) >60 mL/min   GFR calc Af Amer 43 (L) >60 mL/min   Anion gap 5 5 - 15    Comment: Performed at [place] [address] [place], [place] [address]   *Note: Due to a large number of results and/or encounters for the requested time period, some results have not been displayed. A complete set of results can be found in Results Review.   VAS [place] LOWER EXTREMITY ARTERIAL DUPLEX  Result Date: 12/12/2019 LOWER EXTREMITY ARTERIAL DUPLEX STUDY Indications:  Check patency of graft - unreliable ABI. High Risk Factors: Hypertension.  Vascular Interventions: Right redo fem-pop bypass graft and left endarterectomy                         common femoral on 12/09/2019. History of left fem-pop                         bypass graft in 2006. Current ABI:            Right 0.75, left 1.0 on 12/10/19. Performing Technologist: Oda Cogan RDMS, RVT  Examination Guidelines: A complete evaluation includes B-mode imaging, spectral Doppler, color Doppler, and power Doppler as needed of all accessible portions of each vessel. Bilateral testing is considered an integral part of a complete examination. Limited examinations for reoccurring indications may be performed as noted.  +-----------+--------+-----+--------+--------+--------+ RIGHT      PSV cm/sRatioStenosisWaveformComments +-----------+--------+-----+--------+--------+--------+ ATA Distal 120                                   +-----------+--------+-----+--------+--------+--------+ PTA Distal 54                                     +-----------+--------+-----+--------+--------+--------+ PERO Distal69                                    +-----------+--------+-----+--------+--------+--------+  Right Graft #1: Fem-pop +------------------+--------+--------+----------+--------+                   PSV cm/sStenosisWaveform  Comments +------------------+--------+--------+----------+--------+ Inflow            122             biphasic           +------------------+--------+--------+----------+--------+ Prox Anastomosis  80              monophasic         +------------------+--------+--------+----------+--------+ Proximal Graft    54              monophasic         +------------------+--------+--------+----------+--------+ Mid Graft         54              monophasic         +------------------+--------+--------+----------+--------+ Distal Graft      64              monophasic         +------------------+--------+--------+----------+--------+ Distal Anastomosis56              monophasic         +------------------+--------+--------+----------+--------+ Outflow           86              monophasic         +------------------+--------+--------+----------+--------+   +-----------+--------+-----+--------+--------+--------+ LEFT       PSV cm/sRatioStenosisWaveformComments +-----------+--------+-----+--------+--------+--------+ POP Distal 67                                    +-----------+--------+-----+--------+--------+--------+ ATA Distal 90                                    +-----------+--------+-----+--------+--------+--------+  PTA Distal 31                                    +-----------+--------+-----+--------+--------+--------+ PERO Distal93                                    +-----------+--------+-----+--------+--------+--------+  Left Graft #1: +--------------------+--------+--------+--------+--------+                     PSV  cm/sStenosisWaveformComments +--------------------+--------+--------+--------+--------+ Inflow              107             biphasic         +--------------------+--------+--------+--------+--------+ Proximal Anastomosis113             biphasic         +--------------------+--------+--------+--------+--------+ Proximal Graft      56              biphasic         +--------------------+--------+--------+--------+--------+ Mid Graft           79              biphasic         +--------------------+--------+--------+--------+--------+ Distal Graft        75              biphasic         +--------------------+--------+--------+--------+--------+ Distal Anastomosis  80              biphasic         +--------------------+--------+--------+--------+--------+ Outflow             67              biphasic         +--------------------+--------+--------+--------+--------+   Summary: Right: Patent right femoral-popliteal bypass graft without significant stenosis. Left: Patent left femoral-popliteal bypass graft without significant stenosis.  See table(s) above for measurements and observations. Electronically signed by Servando Snare MD on 12/12/2019 at 2:48:04 PM.    Final        Medical Problem List and Plan: 1.  Decreased functional mobility secondary to PVD status post redo right femoral to below-knee popliteal bypass as well as left common femoral enterectomy 12/09/2019.  Follow-up vascular surgery Dr. Oneida Alar  -patient may shower covering right lower extremity  -ELOS/Goals: 7-10 days/supervision/mod I  Admit to CIR 2.  Antithrombotics: -DVT/anticoagulation: Eliquis  -antiplatelet therapy: Aspirin 81 mg daily 3.  Failed back syndrome/pain Management:  Oxycodone as needed  Monitor with increased exertion 4. Mood: Provide emotional support  -antipsychotic agents: N/A 5. Neuropsych: This patient is capable of making decisions on her own behalf. 6. Skin/Wound Care:  Routine skin checks 7. Fluids/Electrolytes/Nutrition: Routine in and outs.  CMP ordered. 8.  Acute blood loss anemia.  CBC ordered. 9.  Rectal bleeding .  Follow-up GI.  CT of abdomen consistent with ischemic colitis.  CBC ordered  Cont IV Ancef 10.  Atrial fibrillation RVR.  Follow-up cardiology services.. Continue Toprol-XL 50 mg daily  Monitor with increased exertion. 11.  GERD.  Protonix 12.  COPD.  Check oxygen saturations every shift 13.  Hyperlipidemia. Lipitor 14.  Trigeminal neuralgia.  Maintained on Lamictal 100 mg twice daily per neurology services  Cathlyn Parsons, PA-C 12/13/2019  I have personally performed a face to face diagnostic  evaluation, including, but not limited to relevant history and physical exam findings, of this patient and developed relevant assessment and plan.  Additionally, I have reviewed and concur with the physician assistant's documentation above.  Delice Lesch, MD, ABPMR  The patient's status has not changed. The original post admission physician evaluation remains appropriate, and any changes from the pre-admission screening or documentation from the acute chart are noted above.   Delice Lesch, MD, ABPMR

## 2019-12-13 NOTE — Progress Notes (Addendum)
ANTICOAGULATION CONSULT NOTE -  Pharmacy Consult for transition from Heparin to Apixaban Indication: multiple thrombosis/Afib  Allergies  Allergen Reactions  . Sulfa Antibiotics Other (See Comments)    Cold sweat light headed and disorientation  . Tiotropium Bromide Shortness Of Breath and Other (See Comments)    Sore throat also  . Latex Itching and Rash  . Ultram [Tramadol] Nausea And Vomiting  . 5-Alpha Reductase Inhibitors     Patient Measurements: Weight 74.5 kg Height: 4 ft 11 inches  Vital Signs: Temp: 97.8 F (36.6 C) (08/20 1144) Temp Source: Oral (08/20 1600) BP: 112/67 (08/20 1600) Pulse Rate: 97 (08/20 1600)  Labs: Recent Labs    12/11/19 0024 12/11/19 0024 12/11/19 2216 12/12/19 0251 12/13/19 0323  HGB 10.1*   < >  --  8.0* 8.5*  HCT 30.9*  --   --  24.6* 25.6*  PLT 120*  --   --  102* 154  HEPARINUNFRC  --   --  <0.10* 0.15*  --   CREATININE 1.32*  --   --   --  1.26*   < > = values in this interval not displayed.    Estimated Creatinine Clearance: 24.5 mL/min (A) (by C-G formula based on SCr of 1.26 mg/dL (H)).  Assessment:  Anticoag: 86 y.o female who was on warfarin for hx multiple thrombosis/Afib PTA. Warfarin was held for PVD surgery 8/16. 8/18: started IV heparin. Patient then developed rectal bleeding secondary to acute colitis. Hgb 11.6>8>8.5 up today. Plts 102>154. Held IV heparin 24 hrs on 8/19-8/20. Heparin IV drip resumed this morning 8/20.  Patient discharged this afternoon from [facility] and admitted to CIR.  Pharmacy consulted to transition to oral Apixaban for nonvalvular atrial fibrillation as recommended by cardiologist, Dr. Meda Coffee.   RN reports no bleeding noted.  86 y.o female, SCr is 1.26  And weight is 74.5 kg.  Will stop IV heparin drip now and start Apixaban 5 mg BID.  Monitor renal function and for signs/symptoms of bleeding.   Plan:   Discontinue IV heparin now and start Apixaban 5mg  bid  (afib, age >68, SCr <1.5, wt >60 kg).   Monitor renal function and for signs/symptoms of bleeding TOC consult for benefits check , f/u as CIR does not have CM  Need education.  AVS posted.   ? TOC pharmacy for discharge rx.   Thank you for allowing pharmacy to be part of this patients care team.  Nicole Cella, RPh Clinical Pharmacist Please check AMION for all [facility] phone numbers After 10:00 PM, call [place] [phone] 12/13/2019,5:46 PM

## 2019-12-13 NOTE — Progress Notes (Signed)
ANTICOAGULATION CONSULT NOTE - Follow Up Consult  Pharmacy Consult for Heparin Indication: multiple thrombosis/Afib  Allergies  Allergen Reactions  . Sulfa Antibiotics Other (See Comments)    Cold sweat light headed and disorientation  . Tiotropium Bromide Shortness Of Breath and Other (See Comments)    Sore throat also  . Latex Itching and Rash  . Ultram [Tramadol] Nausea And Vomiting  . 5-Alpha Reductase Inhibitors     Patient Measurements: Height: 4\' 11"  (149.9 cm) Weight: 74.5 kg (164 lb 3.2 oz) IBW/kg (Calculated) : 43.2 Heparin Dosing Weight: 60.1 kg  Vital Signs: Temp: 97.8 F (36.6 C) (08/20 0723) Temp Source: Oral (08/20 0723) BP: 115/57 (08/20 0723) Pulse Rate: 103 (08/20 0723)  Labs: Recent Labs    12/10/19 1556 12/10/19 1556 12/11/19 0024 12/11/19 0024 12/11/19 2216 12/12/19 0251 12/13/19 0323  HGB 11.6*   < > 10.1*   < >  --  8.0* 8.5*  HCT 36.2   < > 30.9*  --   --  24.6* 25.6*  PLT  --   --  120*  --   --  102* 154  HEPARINUNFRC  --   --   --   --  <0.10* 0.15*  --   CREATININE 1.23*  --  1.32*  --   --   --  1.26*   < > = values in this interval not displayed.    Estimated Creatinine Clearance: 24.5 mL/min (A) (by C-G formula based on SCr of 1.26 mg/dL (H)).  Assessment:  Anticoag: Warfarin for hx multiple thrombosis/Afib PTA. Held for PVD surgery 8/16. 8/18: started IV heparin. Patient then developed rectal bleeding secondary to acute colitis. Hgb 11.6>8>8/5 up today. Plts 102>154. Held IV heparin 24 hrs on 8/19-8/20. Resume 8/20 then plan transition to Eliquis vs warfarin  - Office goal: 2-3.5; pta regimen 5 mg daily   - 8/19: Hold heparin x 24hrs with drop in Hgb  Goal of Therapy:  Heparin level 0.3-0.7 units/ml Monitor platelets by anticoagulation protocol: Yes   Plan:   Dr. Oneida Alar wants to resume IV heparin today 8/20 and possibly convert to warfarin vs DOAC (see cards note 8/19) tomorrow. Resume IV heparin (no bolus) at 1150  units/hr. Check heparin level 8 hrs after started Daily HL and CBC  Neyla Gauntt S. Alford Highland, PharmD, BCPS Clinical Staff Pharmacist [email or web address] Alford Highland, [employer] 12/13/2019,9:38 AM

## 2019-12-13 NOTE — Progress Notes (Signed)
ANTICOAGULATION CONSULT NOTE -  Pharmacy Consult for: transition from IV heparin to Apixaban Indication: multiple thrombosis/Afib  Allergies  Allergen Reactions  . Sulfa Antibiotics Other (See Comments)    Cold sweat light headed and disorientation  . Tiotropium Bromide Shortness Of Breath and Other (See Comments)    Sore throat also  . Latex Itching and Rash  . Ultram [Tramadol] Nausea And Vomiting  . 5-Alpha Reductase Inhibitors     Patient Measurements: Weight 74.5 kg Height: 4 ft 11 inches  Vital Signs: Temp: 97.8 F (36.6 C) (08/20 1144) Temp Source: Oral (08/20 1727) BP: 120/67 (08/20 1727) Pulse Rate: 97 (08/20 1727)  Labs: Recent Labs    12/11/19 0024 12/11/19 0024 12/11/19 2216 12/12/19 0251 12/13/19 0323  HGB 10.1*   < >  --  8.0* 8.5*  HCT 30.9*  --   --  24.6* 25.6*  PLT 120*  --   --  102* 154  HEPARINUNFRC  --   --  <0.10* 0.15*  --   CREATININE 1.32*  --   --   --  1.26*   < > = values in this interval not displayed.    Estimated Creatinine Clearance: 26.3 mL/min (A) (by C-G formula based on SCr of 1.26 mg/dL (H)).  Assessment:  Anticoag: 86 y.o female who was on warfarin for hx multiple thrombosis/Afib PTA. Warfarin was held for PVD surgery 8/16. 8/18: started IV heparin. Patient then developed rectal bleeding secondary to acute colitis. Hgb 11.6>8>8.5 up today. Plts 102>154. Held IV heparin 24 hrs on 8/19-8/20. Heparin IV drip resumed this morning 8/20.  Patient discharged this afternoon from [facility] and admitted to CIR.  Pharmacy consulted to transition to oral Apixaban for nonvalvular atrial fibrillation as recommended by cardiologist, Dr. Meda Coffee.   RN reports no bleeding noted.  86 y.o female, SCr is 1.26  And weight is 74.5 kg.  Will stop IV heparin drip now and start Apixaban 5 mg BID.  Monitor renal function and for signs/symptoms of bleeding.   Plan:   Discontinue IV heparin now and start Apixaban 5mg  bid  (afib, age >89, SCr <1.5, wt >60 kg).   Monitor renal function and for signs/symptoms of bleeding TOC consult for benefits check , f/u as CIR does not have CM  Need education.  AVS posted.   ? TOC pharmacy for discharge rx.   Thank you for allowing pharmacy to be part of this patients care team.  Nicole Cella, RPh Clinical Pharmacist Please check AMION for all [place] phone numbers After 10:00 PM, call [place] [phone] 12/13/2019,5:57 PM

## 2019-12-13 NOTE — Discharge Instructions (Signed)
 [facility]  Discharge instructions  Lower Extremity Bypass Surgery  Please refer to the following instruction for your post-procedure care. Your surgeon or physician assistant will discuss any changes with you.  Activity  You are encouraged to walk as much as you can. You can slowly return to normal activities during the month after your surgery. Avoid strenuous activity and heavy lifting until your doctor tells you it's OK. Avoid activities such as vacuuming or swinging a golf club. Do not drive until your doctor give the OK and you are no longer taking prescription pain medications. It is also normal to have difficulty with sleep habits, eating and bowel movement after surgery. These will go away with time.  Bathing/Showering  You may shower after you go home. Do not soak in a bathtub, hot tub, or swim until the incision heals completely.  Incision Care  Clean your incision with mild soap and water. Shower every day. Pat the area dry with a clean towel. You do not need a bandage unless otherwise instructed. Do not apply any ointments or creams to your incision. If you have open wounds you will be instructed how to care for them or a visiting nurse may be arranged for you. If you have staples or sutures along your incision they will be removed at your post-op appointment. You may have skin glue on your incision. Do not peel it off. It will come off on its own in about one week. If you have a great deal of moisture in your groin, use a gauze help keep this area dry.  Diet  Resume your normal diet. There are no special food restrictions following this procedure. A low fat/ low cholesterol diet is recommended for all patients with vascular disease. In order to heal from your surgery, it is CRITICAL to get adequate nutrition. Your body requires vitamins, minerals, and protein. Vegetables are the best source of vitamins and minerals. Vegetables also provide the  perfect balance of protein. Processed food has little nutritional value, so try to avoid this.  Medications  Resume taking all your medications unless your doctor or nurse practitioner tells you not to. If your incision is causing pain, you may take over-the-counter pain relievers such as acetaminophen (Tylenol). If you were prescribed a stronger pain medication, please aware these medication can cause nausea and constipation. Prevent nausea by taking the medication with a snack or meal. Avoid constipation by drinking plenty of fluids and eating foods with high amount of fiber, such as fruits, vegetables, and grains. Take Colase 100 mg (an over-the-counter stool softener) twice a day as needed for constipation. Do not take Tylenol if you are taking prescription pain medications.  Follow Up  Our office will schedule a follow up appointment 2-3 weeks following discharge.  Please call us immediately for any of the following conditions  Severe or worsening pain in your legs or feet while at rest or while walking Increase pain, redness, warmth, or drainage (pus) from your incision site(s) Fever of 101 degree or higher The swelling in your leg with the bypass suddenly worsens and becomes more painful than when you were in the hospital If you have been instructed to feel your graft pulse then you should do so every day. If you can no longer feel this pulse, call the office immediately. Not all patients are given this instruction.  Leg swelling is common after leg bypass surgery.  The swelling should improve over a few months   following surgery. To improve the swelling, you may elevate your legs above the level of your heart while you are sitting or resting. Your surgeon or physician assistant may ask you to apply an ACE wrap or wear compression (TED) stockings to help to reduce swelling.  Reduce your risk of vascular disease  Stop smoking. If you would like help call [facility] at 1-800-QUIT-NOW  (1-800-784-8669) or [place] at 336-586-4000.  Manage your cholesterol Maintain a desired weight Control your diabetes weight Control your diabetes Keep your blood pressure down  If you have any questions, please call the office at [phone]   

## 2019-12-13 NOTE — Progress Notes (Signed)
Patient transferred to CIR via wheelchair and belonging with cart.

## 2019-12-13 NOTE — Progress Notes (Signed)
Pharmacy Antibiotic Note  [name] [name] is a 86 y.o. female admitted on 12/09/2019 with ischemic colitis.  Pharmacy has been consulted for Ancef dosing.  ID: Concern for ischemic colitis - WBC 11.5 down. Afebrile. Scr 1.26 stable.   Cefazolin 8/16 >>  Flagyl 8/17 >>  8/16 UCx: enterobacter aerogenes sens to cipro/bactrim/zosyn, no symptoms documented so would not treat 8/16 MRSA PCR: neg, staph aureus +  Plan: Ancef 2g IV every 12 hours + flagyl 500 mg IV q8 F/u anticoagulation decision today.     Height: 4\' 11"  (149.9 cm) Weight: 74.5 kg (164 lb 3.2 oz) IBW/kg (Calculated) : 43.2  Temp (24hrs), Avg:98 F (36.7 C), Min:97.6 F (36.4 C), Max:98.2 F (36.8 C)  Recent Labs  Lab 12/09/19 0637 12/09/19 1239 12/09/19 2033 12/10/19 0400 12/10/19 1556 12/11/19 0024 12/12/19 0251 12/13/19 0323  WBC   < >  --  18.1* 17.5*  --  16.8* 12.6* 11.5*  CREATININE  --  0.70 1.18*  --  1.23* 1.32*  --  1.26*   < > = values in this interval not displayed.    Estimated Creatinine Clearance: 24.5 mL/min (A) (by C-G formula based on SCr of 1.26 mg/dL (H)).    Allergies  Allergen Reactions  . Sulfa Antibiotics Other (See Comments)    Cold sweat light headed and disorientation  . Tiotropium Bromide Shortness Of Breath and Other (See Comments)    Sore throat also  . Latex Itching and Rash  . Ultram [Tramadol] Nausea And Vomiting  . 5-Alpha Reductase Inhibitors     [name] S. Alford Highland, PharmD, BCPS Clinical Staff Pharmacist [email or web address] [place] 12/13/2019 8:35 AM

## 2019-12-13 NOTE — Progress Notes (Signed)
Mobility Specialist: Progress Note    12/13/19 1519  Mobility  Activity Ambulated in hall  Level of Assistance Minimal assist, patient does 75% or more  Assistive Device Front wheel walker  Distance Ambulated (ft) 220 ft  Mobility Response Tolerated well  Mobility performed by Mobility specialist  Bed Position Chair  $Mobility charge 1 Mobility   Pre-Mobility: 107 HR, 134/67 BP, 100% SpO2 Post-Mobility: 116 HR, 143/83 BP, 97% SpO2  Pt stopped to take a standing rest break halfway through walk. Pt c/o of feeling tight in her R calf. Pt had no c/o of SOB or dizziness. After returning to room pt's HR spiked to 133 but quickly returned to normal.   Harrell Gave [employer] Mobility Specialist

## 2019-12-13 NOTE — Progress Notes (Signed)
Inpatient Rehabilitation Medication Review by a Pharmacist  A complete drug regimen review was completed for this patient to identify any potential clinically significant medication issues.  Clinically significant medication issues were identified: No  Check AMION for pharmacist assigned to patient if future medication questions/issues arise during this admission.  Pharmacist comments:  Transitioned from heparin infusion to apixaban -  TOC consult for benefits check , f/u as CIR does not have CM  Needs education.  AVS posted.   ? TOC pharmacy for discharge rx.  Time spent performing this drug regimen review (minutes):  10   Claudina Oliphant P. Legrand Como, PharmD, [place] 12/13/2019 6:02 PM

## 2019-12-13 NOTE — Progress Notes (Signed)
Physical Therapy Treatment Patient Details Name: [name] [name] MRN: [id number] DOB: 05-23-1924 Today's Date: 12/13/2019    History of Present Illness Pt is a 86 y.o. F with significant PMH of HTN, paroxysmal atrial fibrillation, PVD, prior right femoropopliteal bypass who presents with redo right femoropopliteal bypass and left common femoral endarterectomy 12/09/2019. CT showing findings of acute colitis.     PT Comments    Pt progressing towards goals. Increased mobility tolerance noted, however, remains unsteady, with short shuffling step pattern to chair. Required min A throughout using RW. Also performed seated HEP with pt. Current recommendations appropriate as pt very motivated to regain independence. Will continue to follow acutely.     Follow Up Recommendations  CIR     Equipment Recommendations  Rolling walker with 5" wheels;3in1 (PT);Wheelchair (measurements PT);Wheelchair cushion (measurements PT)    Recommendations for Other Services       Precautions / Restrictions Precautions Precautions: Fall;Other (comment) Precaution Comments: watch HR  Restrictions Weight Bearing Restrictions: No    Mobility  Bed Mobility Overal bed mobility: Needs Assistance Bed Mobility: Supine to Sit     Supine to sit: Min guard     General bed mobility comments: Increased time required, and min guard for safety. Pt heavily reliant on bed rails.   Transfers Overall transfer level: Needs assistance Equipment used: Rolling walker (2 wheeled) Transfers: Sit to/from [facility] Sit to Stand: Min assist Stand pivot transfers: Min assist       General transfer comment: Min A for lift assist and steadying. Pt with slow shuffled steps to chair and required min A for steadying.   Ambulation/Gait                 Stairs             Wheelchair Mobility    Modified Rankin (Stroke Patients Only)       Balance Overall balance assessment: Needs  assistance Sitting-balance support: Feet unsupported Sitting balance-Leahy Scale: Fair     Standing balance support: Bilateral upper extremity supported;During functional activity Standing balance-Leahy Scale: Poor Standing balance comment: Reliant on BUE support                             Cognition Arousal/Alertness: Awake/alert Behavior During Therapy: WFL for tasks assessed/performed Overall Cognitive Status: Within Functional Limits for tasks assessed                                 General Comments: Pt with much improved cognition this session.       Exercises General Exercises - Lower Extremity Ankle Circles/Pumps: AROM;Both;20 reps Long Arc Quad: AROM;Both;10 reps;Seated    General Comments General comments (skin integrity, edema, etc.): Pt's daughter present       Pertinent Vitals/Pain Pain Assessment: No/denies pain    Home Living                      Prior Function            PT Goals (current goals can now be found in the care plan section) Acute Rehab PT Goals Patient Stated Goal: to be able to walk  PT Goal Formulation: With patient/family Time For Goal Achievement: 12/25/19 Potential to Achieve Goals: Good Progress towards PT goals: Progressing toward goals    Frequency    Min 3X/week  PT Plan Current plan remains appropriate    Co-evaluation              AM-PAC PT "6 Clicks" Mobility   Outcome Measure  Help needed turning from your back to your side while in a flat bed without using bedrails?: A Little Help needed moving from lying on your back to sitting on the side of a flat bed without using bedrails?: A Little Help needed moving to and from a bed to a chair (including a wheelchair)?: A Little Help needed standing up from a chair using your arms (e.g., wheelchair or bedside chair)?: A Little Help needed to walk in hospital room?: A Lot Help needed climbing 3-5 steps with a railing? :  Total 6 Click Score: 15    End of Session Equipment Utilized During Treatment: Gait belt Activity Tolerance: Patient tolerated treatment well Patient left: in chair;with call bell/phone within reach;with chair alarm set;with family/visitor present Nurse Communication: Mobility status PT Visit Diagnosis: Pain;Difficulty in walking, not elsewhere classified (R26.2);Unsteadiness on feet (R26.81) Pain - part of body:  (groin)     Time: 7225-7505 PT Time Calculation (min) (ACUTE ONLY): 17 min  Charges:  $Therapeutic Activity: 8-22 mins                     Lou Miner, DPT  Acute Rehabilitation Services  Pager: [phone] Office: [phone]    Rudean Hitt 12/13/2019, 11:20 AM

## 2019-12-13 NOTE — Progress Notes (Signed)
PMR Admission Coordinator Pre-Admission Assessment   Patient: [name] [name] is an 86 y.o., female MRN: [id number] DOB: 1924/08/18 Height: _0  (149.9 cm) Weight: 74.5 kg   Insurance Information HMO: No    PPO:       PCP:       IPA:       80/20:       OTHER:   PRIMARY: Medicare A and B      Policy#: [id number]      Subscriber: patient CM Name:        Phone#:       Fax#:   Pre-Cert#:        Employer: Retired Benefits:  Phone #:       Name: Checked in Kay. Date: 10/24/1991     Deduct: $1484      Out of Pocket Max: none      Life Max: N/A CIR: 100%      SNF: 100 days Outpatient: 80%     Co-Pay: 20% Home Health: 100%      Co-Pay: 20% DME: 80%     Co-Pay: 20% Providers: patient's choice  SECONDARY: BCBS supplement      Policy#: [id number]     Phone#: patient       The "Data Collection Information Summary" for patients in Inpatient Rehabilitation Facilities with attached "Privacy Act [place] Records" was provided and verbally reviewed with: Patient and Family   Emergency Contact Information         Contact Information     Name Relation Home Work Mobile    Gorney,Scott Son     [phone]    Alda Ponder Daughter [phone]        Fox,Faye Daughter [phone]             Current Medical History  Patient Admitting Diagnosis: PVD   History of Present Illness:  [name] [name] is a 86 year old right-handed female with history of hypertension, hyperlipidemia, paroxysmal atrial fibrillation maintained on chronic Coumadin followed by cardiology services Dr. Oval Linsey, COPD as well as peripheral vascular disease.  Per chart review patient lives alone.  Modified independent using straight point cane.  Performing all ADLs and mobility tasks.  1 level home with 2 steps to entry.  She does have a son and daughter in the area.  About 4 to 6 weeks ago patient was having difficulty ambulating with increased rest pain to the right foot.  She had  previously undergone right femoral to above-knee popliteal bypass 2006 by [name] revised in 2010 as well as 1 prior thrombolysis procedure to the right leg.  She had an arteriogram in February 2020 that showed 80% narrowing of the midportion of her right femoral-popliteal bypass graft.  She also had a 70% stenosis of her left common femoral artery.  Patient was admitted 12/09/2019 and underwent redo right femoral to below-knee popliteal bypass as well as left common femoral enterectomy bovine pericardial patch 12/09/2019 per Dr. Oneida Alar.  Postoperatively patient did develop some painless rectal bleeding 12/10/2019 with GI consulted Dr.Nandigmam and underwent CT of the abdomen findings consistent with ischemic colitis and close monitoring of hemoglobin 8.5 and does remain on intravenous Ancef.  Follow-up cardiology services for atrial fibrillation RVR after Coumadin initially held for revascularization and initially placed on intravenous heparin held due to anemia and transitioned to [facility] 12/13/2019 and heparin  stopped.   Patient's medical record from [facility] has been reviewed by the rehabilitation admission coordinator and physician.  Past Medical History      Past Medical History:  Diagnosis Date  . Allergic rhinoconjunctivitis    . Allergy    . Anxiety      pt. managed- uses deep breathing   . Arthritis      low back , stenosis  . COLONIC POLYPS, RECURRENT 08/29/2006    2008 last colonoscopy. No further colonoscopy.     Marland Kitchen COPD (chronic obstructive pulmonary disease) ([place])    . Diverticulosis    . Dysrhythmia      afib  . Eczema    . Environmental allergies      allergy shot- q friday in Dr. Janee Morn office. PFT's abnormal- recommended Spiriva to use preop & will d/c after surgery  . GERD (gastroesophageal reflux disease)    . Hiatal hernia    . Hyperlipidemia    . Hypertension    . Lung nodule 2011  . Paroxysmal atrial fibrillation ([place]) 11/27/2019  . Peripheral vascular  disease ([place])    . Shingles    . Stenosis of popliteal artery (HCC)      blood clots in legs long ago    . Trigeminal neuralgia      Left buttocks      Family History   family history includes Arthritis in her mother; Brain cancer in her sister; Breast cancer in her daughter and sister; Colon cancer in her sister; Colon polyps in her father; Heart disease in her father; Hypertension in her mother and son; Lung cancer in her brother and sister.   Prior Rehab/Hospitalizations Has the patient had prior rehab or hospitalizations prior to admission? No   Has the patient had major surgery during 100 days prior to admission? Yes              Current Medications   Current Facility-Administered Medications:  .  0.9 %  sodium chloride infusion (Manually program via Guardrails IV Fluids), , Intravenous, Once, Setzer, Edman Circle, PA-C .  acetaminophen (TYLENOL) tablet 325-650 mg, 325-650 mg, Oral, Q4H PRN, 650 mg at 12/10/19 0104 **OR** acetaminophen (TYLENOL) suppository 325-650 mg, 325-650 mg, Rectal, Q4H PRN, Setzer, Edman Circle, PA-C .  alum & mag hydroxide-simeth (MAALOX/MYLANTA) 200-200-20 MG/5ML suspension 15-30 mL, 15-30 mL, Oral, Q2H PRN, Setzer, Edman Circle, PA-C .  aspirin EC tablet 81 mg, 81 mg, Oral, QPM, Barbie Banner, PA-C, 81 mg at 12/12/19 1834 .  atorvastatin (LIPITOR) tablet 40 mg, 40 mg, Oral, q1800, Barbie Banner, PA-C, 40 mg at 12/12/19 1834 .  baclofen (LIORESAL) tablet 5 mg, 5 mg, Oral, BID, Setzer, Edman Circle, PA-C, 5 mg at 12/12/19 2214 .  bisacodyl (DULCOLAX) suppository 10 mg, 10 mg, Rectal, Daily PRN, Setzer, Edman Circle, PA-C .  ceFAZolin (ANCEF) IVPB 2g/100 mL premix, 2 g, Intravenous, Q12H, Fields, Jessy Oto, MD, Last Rate: 200 mL/hr at 12/13/19 0922, 2 g at 12/13/19 0922 .  Chlorhexidine Gluconate Cloth 2 % PADS 6 each, 6 each, Topical, Q0600, Elam Dutch, MD, 6 each at 12/13/19 [phone] .  docusate sodium (COLACE) capsule 100 mg, 100 mg, Oral, Daily, Setzer, Edman Circle,  PA-C, 100 mg at 12/13/19 0910 .  guaiFENesin-dextromethorphan (ROBITUSSIN DM) 100-10 MG/5ML syrup 15 mL, 15 mL, Oral, Q4H PRN, Setzer, Edman Circle, PA-C .  hydrALAZINE (APRESOLINE) injection 5 mg, 5 mg, Intravenous, Q20 Min PRN, Setzer, Sandra J, PA-C .  labetalol (NORMODYNE) injection 10 mg, 10 mg, Intravenous, Q10 min PRN, Setzer, Edman Circle, PA-C .  lamoTRIgine (LAMICTAL) tablet 100 mg, 100  mg, Oral, BID, Barbie Banner, PA-C, 100 mg at 12/13/19 0910 .  magnesium sulfate IVPB 2 g 50 mL, 2 g, Intravenous, Daily PRN, Setzer, Edman Circle, PA-C .  metoprolol succinate (TOPROL-XL) 24 hr tablet 50 mg, 50 mg, Oral, Daily, Kathyrn Drown D, NP, 50 mg at 12/13/19 0910 .  metroNIDAZOLE (FLAGYL) IVPB 500 mg, 500 mg, Intravenous, Q8H, Fields, Jessy Oto, MD, Last Rate: 100 mL/hr at 12/13/19 0444, 500 mg at 12/13/19 0444 .  morphine 2 MG/ML injection 2 mg, 2 mg, Intravenous, Q4H PRN, Setzer, Edman Circle, PA-C .  mupirocin ointment (BACTROBAN) 2 % 1 application, 1 application, Nasal, BID, Elam Dutch, MD, 1 application at 56/38/93 [phone] .  ondansetron (ZOFRAN) injection 4 mg, 4 mg, Intravenous, Q6H PRN, Barbie Banner, PA-C, 4 mg at 12/11/19 0019 .  oxyCODONE-acetaminophen (PERCOCET/ROXICET) 5-325 MG per tablet 1-2 tablet, 1-2 tablet, Oral, Q4H PRN, Barbie Banner, PA-C, 1 tablet at 12/12/19 2218 .  pantoprazole (PROTONIX) EC tablet 40 mg, 40 mg, Oral, BID, Barbie Banner, PA-C, 40 mg at 12/13/19 0910 .  phenol (CHLORASEPTIC) mouth spray 1 spray, 1 spray, Mouth/Throat, PRN, Setzer, Edman Circle, PA-C .  potassium chloride SA (KLOR-CON) CR tablet 20-40 mEq, 20-40 mEq, Oral, Daily PRN, Setzer, Sandra J, PA-C .  senna-docusate (Senokot-S) tablet 1 tablet, 1 tablet, Oral, QHS PRN, Vaughan Basta, Edman Circle, PA-C   Patients Current Diet:     Diet Order                      Diet Heart Room service appropriate? Yes; Fluid consistency: Thin  Diet effective 1400                      Precautions /  Restrictions Precautions Precautions: Fall, Other (comment) Precaution Comments: watch HR  Restrictions Weight Bearing Restrictions: No    Has the patient had 2 or more falls or a fall with injury in the past year? No   Prior Activity Level Community (5-7x/wk): Martin Majestic out as needed, was driving.   Prior Functional Level Self Care: Did the patient need help bathing, dressing, using the toilet or eating? Independent   Indoor Mobility: Did the patient need assistance with walking from room to room (with or without device)? Independent   Stairs: Did the patient need assistance with internal or external stairs (with or without device)? Independent   Functional Cognition: Did the patient need help planning regular tasks such as shopping or remembering to take medications? Independent   Home Assistive Devices / Equipment Home Assistive Devices/Equipment: Dentures (specify type), Cane (specify quad or straight), Built-in shower seat, Hand-held shower hose, Grab bars in shower Home Equipment: Cane - single point, Environmental consultant - 4 wheels, Shower seat, Grab bars - tub/shower, Hand held shower head   Prior Device Use: Indicate devices/aids used by the patient prior to current illness, exacerbation or injury? Walker and SPC   Current Functional Level Cognition   Overall Cognitive Status: Within Functional Limits for tasks assessed Orientation Level: Oriented X4 General Comments: Pt with much improved cognition this session.     Extremity Assessment (includes Sensation/Coordination)   Upper Extremity Assessment: Generalized weakness  Lower Extremity Assessment: Defer to PT evaluation     ADLs   Overall ADL's : Needs assistance/impaired Eating/Feeding: NPO Eating/Feeding Details (indicate cue type and reason): except ice chips  Grooming: Supervision/safety, Sitting Grooming Details (indicate cue type and reason): supported sitting, minA for sitting balance EOB when fatigued  Upper Body Bathing:  Minimal assistance, Sitting Lower Body Bathing: Maximal assistance, +2 for physical assistance, Sit to/from stand Upper Body Dressing : Minimal assistance, Sitting Lower Body Dressing: Total assistance, +2 for physical assistance, Sit to/from stand Lower Body Dressing Details (indicate cue type and reason): assist for donning socks today Toilet Transfer: Moderate assistance, +2 for physical assistance, +2 for safety/equipment, Stand-pivot, RW Toilet Transfer Details (indicate cue type and reason): simulated via transfer to [place] and Hygiene: Maximal assistance, +2 for physical assistance, +2 for safety/equipment, Sit to/from stand Functional mobility during ADLs: Moderate assistance, +2 for safety/equipment, +2 for physical assistance, Rolling walker     Mobility   Overal bed mobility: Needs Assistance Bed Mobility: Supine to Sit Supine to sit: Min guard General bed mobility comments: Increased time required, and min guard for safety. Pt heavily reliant on bed rails.      Transfers   Overall transfer level: Needs assistance Equipment used: Rolling walker (2 wheeled) Transfers: Sit to/from Stand, [employer] Transfers Sit to Stand: Min assist Stand pivot transfers: Min assist General transfer comment: Min A for lift assist and steadying. Pt with slow shuffled steps to chair and required min A for steadying.      Ambulation / Gait / Stairs / Wheelchair Mobility   Ambulation/Gait Ambulation/Gait assistance: Mod assist, +2 physical assistance Gait Distance (Feet): 3 Feet Assistive device: Rolling walker (2 wheeled) Gait Pattern/deviations: Step-to pattern, Decreased stride length General Gait Details: Pt with pivotal steps from bed to chair, modA + 2 for stability. Max cues for stepping initiation, anterior weight shift, sequencing/direction.  Gait velocity: decreased Gait velocity interpretation: <1.31 ft/sec, indicative of household ambulator      Posture / Balance Dynamic Sitting Balance Sitting balance - Comments: Requiring close min guard - min assist for safety. Improvement with foot support Balance Overall balance assessment: Needs assistance Sitting-balance support: Feet unsupported Sitting balance-Leahy Scale: Fair Sitting balance - Comments: Requiring close min guard - min assist for safety. Improvement with foot support Postural control: Right lateral lean Standing balance support: Bilateral upper extremity supported, During functional activity Standing balance-Leahy Scale: Poor Standing balance comment: Reliant on BUE support      Special needs/care consideration Skin Surgical Incision Special service needs: Pt. On IV Acef and Flagyl  Designated visitor  [place] (from acute therapy documentation) Living Arrangements: Alone Available Help at Discharge: Family, Available PRN/intermittently Type of Home: House Home Layout: One level Home Access: Stairs to enter Entrance Stairs-Rails: Right, Left Entrance Stairs-Number of Steps: 2 ("a couple") Bathroom Shower/Tub: Walk-in shower   Discharge Living Setting Plans for Discharge Living Setting: Patient's home, Alone, Other (Comment) Type of Home at Discharge: Other (Comment) (Condominium) Discharge Home Layout: One level, Able to live on main level with bedroom/bathroom Discharge Home Access: Stairs to enter Entrance Stairs-Rails: Right, Left, Can reach both Entrance Stairs-Number of Steps: 1 step Discharge Bathroom Shower/Tub: Walk-in shower, Door Discharge Bathroom Toilet: Handicapped height Discharge Bathroom Accessibility: Yes How Accessible: Accessible via walker   Social/Family/Support Systems Patient Roles: Parent (Has 2 daughters and 1 son in the area.) Contact Information: Alda Ponder - daughter [phone] Anticipated Caregiver: 3 adult children Ability/Limitations of Caregiver: Son is retired.  One daughter  works, one daughter is retired Careers adviser: Intermittent (Could stay with patient if needed) Discharge Plan Discussed with Primary Caregiver: Yes (Discussed with patient and son.) Is Caregiver In Agreement with Plan?: Yes Does Caregiver/Family have Issues with  Lodging/Transportation while Pt is in Rehab?: No   Goals Patient/Family Goal for Rehab: PT/OT mod I and supervision goals Expected length of stay: 7-10 days Cultural Considerations: None Pt/Family Agrees to Admission and willing to participate: Yes Program Orientation Provided & Reviewed with Pt/Caregiver Including Roles  & Responsibilities: Yes   Decrease burden of Care through IP rehab admission: N/A   Possible need for SNF placement upon discharge: Not anticipated   Patient Condition: I have reviewed medical records from [facility] , spoken with CSW, and patient and daughter. I met with patient at the bedside for inpatient rehabilitation assessment.  Patient will benefit from ongoing PT and OT, can actively participate in 3 hours of therapy a day 5 days of the week, and can make measurable gains during the admission.  Patient will also benefit from the coordinated team approach during an Inpatient Acute Rehabilitation admission.  The patient will receive intensive therapy as well as Rehabilitation physician, nursing, social worker, and care management interventions.  Due to safety, skin/wound care, disease management, medication administration, pain management and patient education the patient requires 24 hour a day rehabilitation nursing.  The patient is currently Mod +2 to Min A  with mobility and basic ADLs.  Discharge setting and therapy post discharge at home with home health is anticipated.  Patient has agreed to participate in the Acute Inpatient Rehabilitation Program and will admit today.   Preadmission Screen Completed By:  Genella Mech, 12/13/2019 12:07  PM ______________________________________________________________________   Discussed status with Dr. Posey Pronto on 12/13/2019 at 59 and received approval for admission today.   Admission Coordinator:  Genella Mech, CCC-SLP, Updates added by Clemens Catholic, MS CCC-SLP time 1205/Date 12/13/2019   Assessment/Plan: Diagnosis: PVD 1. Does the need for close, 24 hr/day Medical supervision in concert with the patient's rehab needs make it unreasonable for this patient to be served in a less intensive setting? Yes  2. Co-Morbidities requiring supervision/potential complications:  Rectal bleeding, ischemic colitis, atrial fibrillation RVR, hypertension, hyperlipidemia, paroxysmal atrial fibrillation, COPD, peripheral vascular disease  3. Due to safety, skin/wound care, disease management, pain management and patient education, does the patient require 24 hr/day rehab nursing? Yes 4. Does the patient require coordinated care of a physician, rehab nurse, PT, OT to address physical and functional deficits in the context of the above medical diagnosis(es)? Yes Addressing deficits in the following areas: balance, endurance, locomotion, strength, transferring, bathing, dressing, toileting and psychosocial support 5. Can the patient actively participate in an intensive therapy program of at least 3 hrs of therapy 5 days a week? Yes 6. The potential for patient to make measurable gains while on inpatient rehab is excellent 7. Anticipated functional outcomes upon discharge from inpatient rehab: modified independent and supervision PT, modified independent and supervision OT, n/a SLP 8. Estimated rehab length of stay to reach the above functional goals is: 7-11 days. 9. Anticipated discharge destination: Home 10. Overall Rehab/Functional Prognosis: excellent     MD Signature: Delice Lesch, MD, ABPMR

## 2019-12-13 NOTE — Discharge Summary (Signed)
[facility]ypass Discharge Summary Patient ID: [name] [name] [id number] 86 y.o. Jul 10, 1924  Admit date: 12/09/2019  Discharge date and time:  12/13/19  Admitting Physician: Elam Dutch, MD   Discharge Physician: same  Admission Diagnoses: Status post femoral-popliteal bypass surgery [Z95.828] PAOD (peripheral arterial occlusive disease) ([place]) [I77.9]  Discharge Diagnoses: PAD Atrial fibrillation Chronic GI bleed  Admission Condition: fair  Discharged Condition: fair  Indication for Admission: Critical limb ischemia with rest pain right lower extremity  Hospital Course: [name] is a 86 year old female who was brought in as an outpatient for redo right femoral to below the knee popliteal artery bypass with PTFE and left common femoral artery endarterectomy by Dr. Oneida Alar on 12/09/2019.  She tolerated the procedure well and was admitted to the hospital postoperatively.  POD #1 she was noted to have bloody stool and thus GI was consulted for management of chronic GI bleed.  Recommendations included holding blood thinners and plan to repeat colonoscopy as an outpatient.  She was also in atrial fibrillation postoperatively and thus cardiology was consulted for assistance with rate control.  She was maintained on heparin until time of discharge.  She was transfused 2 units postoperatively due to the drop in hemoglobin and heparin was paused however patient responded well with increase in hemoglobin following 24 hours of holding heparin.  She was evaluated by PT/OT and recommendation was for CIR.  Bilateral lower extremity arterial duplex performed postoperatively demonstrated patent bypasses in bilateral lower extremities.  At the time of discharge she had dopplerable DP and PT signals of bilateral lower extremities.  She will transition to Coumadin starting with her usual home dose.  She has been accepted to CIR and is stable for discharge today.  She will follow-up in office with Dr.  Oneida Alar in 3 to 4 weeks.  Discharge instructions were reviewed with the patient and she voices her understanding.  Consults: cardiology and GI  Treatments: surgery: Redo right femoral to below the knee popliteal bypass with PTFE and left common femoral artery endarterectomy by Dr. Oneida Alar on 12/09/2019    Disposition: Discharge disposition: 90-DC/txfr to inpt rehab facility with planned acute care hosp IP admission       - For VQI Registry use ---  Post-op:  Wound infection: No  Graft infection: No  Transfusion: Yes   New Arrhythmia: No Patency judged by: [ x] Dopper only, [ ]  Palpable graft pulse, [ ]  Palpable distal pulse, [ ]  ABI inc. > 0.15, [ ]  Duplex D/C Ambulatory Status: Ambulatory with Assistance  Complications: MI: [x ] No, [ ]  Troponin only, [ ]  EKG or Clinical CHF: No Resp failure: [x ] none, [ ]  Pneumonia, [ ]  Ventilator Chg in renal function: [x ] none, [ ]  Inc. Cr > 0.5, [ ]  Temp. Dialysis, [ ]  Permanent dialysis Stroke: [x ] None, [ ]  Minor, [ ]  Major Return to OR: No  Reason for return to OR: [ ]  Bleeding, [ ]  Infection, [ ]  Thrombosis, [ ]  Revision  Discharge medications: Statin use:  Yes ASA use:  Yes Plavix use:  No  for medical reason not indicated Beta blocker use: Yes Coumadin use: Yes    Patient Instructions:  Allergies as of 12/13/2019      Reactions   Sulfa Antibiotics Other (See Comments)   Cold sweat light headed and disorientation   Tiotropium Bromide Shortness Of Breath, Other (See Comments)   Sore throat also   Latex Itching, Rash   Ultram [tramadol] Nausea  And Vomiting   5-alpha Reductase Inhibitors       Medication List    TAKE these medications   acetaminophen 500 MG tablet Commonly known as: TYLENOL Take 1,000 mg by mouth 2 (two) times daily.   aspirin EC 81 MG tablet Take 81 mg by mouth every evening.   atorvastatin 40 MG tablet Commonly known as: LIPITOR Take 1 tablet (40 mg total) by mouth daily.   baclofen 10 MG  tablet Commonly known as: LIORESAL TAKE 1/2 (ONE-HALF) TABLET BY MOUTH ONCE DAILY NIGHTLY What changed: See the new instructions.   colchicine 0.6 MG tablet Commonly known as: Colcrys Take 2 pills at first sign gout flare, then another pill 2 hours later if still having pain. Then take once daily until pain resolves.   hydrochlorothiazide 25 MG tablet Commonly known as: HYDRODIURIL TAKE 1 TABLET BY MOUTH AT BEDTIME   Icy Hot 5 % Ptch Generic drug: Menthol Apply 1 patch topically daily as needed (back pain).   irbesartan 300 MG tablet Commonly known as: AVAPRO Take 1 tablet by mouth once daily   lamoTRIgine 100 MG tablet Commonly known as: LAMICTAL Take 1 tablet by mouth twice daily   metoprolol succinate 25 MG 24 hr tablet Commonly known as: Toprol XL Take 1 tablet (25 mg total) by mouth daily.   multivitamin with minerals Tabs tablet Take 1 tablet by mouth daily.   pantoprazole 40 MG tablet Commonly known as: PROTONIX Take 1 tablet (40 mg total) by mouth 2 (two) times daily.   Restasis 0.05 % ophthalmic emulsion Generic drug: cycloSPORINE Place 1 drop into both eyes 2 (two) times daily as needed (dry eyes).   warfarin 5 MG tablet Commonly known as: COUMADIN Take as directed. If you are unsure how to take this medication, talk to your nurse or doctor. Original instructions: TAKE 1 TABLET BY MOUTH ONCE DAILY, EXCEPT TAKE 1 & 1/2 TABLETS ON MONDAY OR TAKE AS DIRECTED BY ANTICOAGULATION CLINIC What changed:   how much to take  how to take this  when to take this  additional instructions      Activity: activity as tolerated Diet: regular diet Wound Care: keep wound clean and dry  Follow-up with Dr. Oneida Alar in 4 weeks.  SignedDagoberto Ligas 12/13/2019 1:06 PM

## 2019-12-13 NOTE — Progress Notes (Addendum)
  Progress Note    12/13/2019 7:38 AM 4 Days Post-Op  Subjective:  No new complaints this morning.  R foot feels much better compared to pre-op   Vitals:   12/12/19 2307 12/13/19 0332  BP: 111/65 (!) 97/55  Pulse: 100 90  Resp: 19 20  Temp: 98.2 F (36.8 C) 98.2 F (36.8 C)  SpO2: 98% 95%   Physical Exam Lungs:  Non labored on RA Incisions:  Groin incisions c/d/i; R popliteal incision c/d/i Extremities:  DP and PT brisk by doppler BLE; pitting edema of B lower legs Neurologic: A&O  CBC    Component Value Date/Time   WBC 11.5 (H) 12/13/2019 0323   RBC 2.82 (L) 12/13/2019 0323   HGB 8.5 (L) 12/13/2019 0323   HCT 25.6 (L) 12/13/2019 0323   PLT 154 12/13/2019 0323   MCV 90.8 12/13/2019 0323   MCH 30.1 12/13/2019 0323   MCHC 33.2 12/13/2019 0323   RDW 14.7 12/13/2019 0323   LYMPHSABS 1.9 06/10/2019 1132   MONOABS 0.6 06/10/2019 1132   EOSABS 0.1 06/10/2019 1132   BASOSABS 0.1 06/10/2019 1132    BMET    Component Value Date/Time   NA 132 (L) 12/13/2019 0323   NA 141 11/27/2019 1026   K 3.8 12/13/2019 0323   CL 106 12/13/2019 0323   CO2 21 (L) 12/13/2019 0323   GLUCOSE 106 (H) 12/13/2019 0323   BUN 24 (H) 12/13/2019 0323   BUN 26 11/27/2019 1026   CREATININE 1.26 (H) 12/13/2019 0323   CREATININE 1.17 (H) 05/08/2019 1402   CALCIUM 7.2 (L) 12/13/2019 0323   GFRNONAA 37 (L) 12/13/2019 0323   GFRAA 43 (L) 12/13/2019 0323    INR    Component Value Date/Time   INR 1.0 12/09/2019 0637     Intake/Output Summary (Last 24 hours) at 12/13/2019 0738 Last data filed at 12/13/2019 0444 Gross per 24 hour  Intake 660 ml  Output 900 ml  Net -240 ml     Assessment/Plan:  86 y.o. female is s/p Redo R fem-pop bypass with PTFE and L CFA endarterectomy 4 Days Post-Op   -BLE well perfused with intact doppler signals; B bypass graft patent based on duplex -Dry gauze B groins to wick moisture; change daily -Heparin paused for 24h due to drop in Hgb yesterday; hgb now  increased from 8 to 8.5, can likely resume today -Fluid status: ansarca of hands and feet improved, breathing improved, Renal labs stable; can receive another dose of lasix today; monitor BP -encouraged OOB and participation with therapy -rehab when bed available and ok with Cardiology   Dagoberto Ligas, PA-C [facility] [phone] 12/13/2019 7:38 AM   Duplex yesterday both bypass grafts patent Rate control improved still in 110s at times No further bloody diarrhea Continue gentle diuresis Agree with above.  Will resume heparin today.  Will transition to oral anticoagulation on her usual warfarin dose with no load if goes to rehab today  [name], MD [facility] [place] Office: [phone]

## 2019-12-13 NOTE — Progress Notes (Addendum)
Occupational therapy Treatment Note  Pt making excellent progress toward goals Able to ambulate @ 100 ft into the hallway. Feel pt can reach modified independent level goals with CIR. VSS on RA with 2/4 DOE.    12/13/19 1500  OT Visit Information  Last OT Received On 12/13/19  Assistance Needed +1  History of Present Illness Pt is a 86 y.o. F with significant PMH of HTN, paroxysmal atrial fibrillation, PVD, prior right femoropopliteal bypass who presents with redo right femoropopliteal bypass and left common femoral endarterectomy 12/09/2019. CT showing findings of acute colitis.   Precautions  Precautions Fall;Other (comment)  Precaution Comments watch HR   Pain Assessment  Pain Assessment No/denies pain  Cognition  Arousal/Alertness Awake/alert  Behavior During Therapy WFL for tasks assessed/performed  Overall Cognitive Status  (delay in processing' may be HOH)  Upper Extremity Assessment  Upper Extremity Assessment Generalized weakness  Lower Extremity Assessment  Lower Extremity Assessment Defer to PT evaluation  ADL  Eating/Feeding Set up  Grooming Set up;Sitting  Upper Body Bathing Set up;Sitting  Upper Body Dressing  Minimal assistance;Sitting  Lower Body Dressing Moderate assistance;Sit to/from Retail buyer Minimal assistance;Ambulation;+2 for safety/equipment;RW  Toileting- Clothing Manipulation and Hygiene Moderate assistance;Sit to/from stand  Functional mobility during ADLs Minimal assistance;Rolling walker;Cueing for safety;+2 for safety/equipment  Bed Mobility  General bed mobility comments OOB in chair  Balance  Overall balance assessment Needs assistance  Sitting balance-Leahy Scale Fair  Standing balance-Leahy Scale Poor  Transfers  Overall transfer level Needs assistance  Equipment used Rolling walker (2 wheeled)  Transfers Sit to/from Stand  Sit to Stand Min assist  General transfer comment Pt initially falling posteriorly; With multiple  repetitions pt able to transtion to stand with min guard A1  General Comments  General comments (skin integrity, edema, etc.) ambulated @ 100 ft with min A @ RW level.   Other Exercises  Other Exercises encouraged use of incentive spirometer  OT - End of Session  Equipment Utilized During Treatment Gait belt;Rolling walker  Activity Tolerance Patient tolerated treatment well  Patient left in chair;with call bell/phone within reach;with chair alarm set;with family/visitor present  Nurse Communication Mobility status  OT Assessment/Plan  OT Plan Discharge plan remains appropriate  OT Visit Diagnosis Unsteadiness on feet (R26.81);Other abnormalities of gait and mobility (R26.89);Muscle weakness (generalized) (M62.81);Pain  OT Frequency (ACUTE ONLY) Min 2X/week  Recommendations for Other Services Rehab consult  Follow Up Recommendations CIR  OT Equipment 3 in 1 bedside commode;Other (comment)  AM-PAC OT "6 Clicks" Daily Activity Outcome Measure (Version 2)  Help from another person eating meals? 3  Help from another person taking care of personal grooming? 3  Help from another person toileting, which includes using toliet, bedpan, or urinal? 2  Help from another person bathing (including washing, rinsing, drying)? 2  Help from another person to put on and taking off regular upper body clothing? 3  Help from another person to put on and taking off regular lower body clothing? 2  6 Click Score 15  OT Goal Progression  Progress towards OT goals Progressing toward goals  Acute Rehab OT Goals  Patient Stated Goal to be able to walk   OT Goal Formulation With patient  Time For Goal Achievement 12/25/19  Potential to Achieve Goals Good  ADL Goals  Pt Will Perform Grooming with supervision;sitting  Pt Will Perform Lower Body Bathing with min assist;sitting/lateral leans;sit to/from stand  Pt Will Perform Upper Body Dressing with supervision;sitting  Pt  Will Perform Lower Body Dressing sit  to/from stand;sitting/lateral leans;with min assist  Pt Will Transfer to Toilet with min assist;ambulating;stand pivot transfer  Pt Will Perform Toileting - Clothing Manipulation and hygiene with min assist;sit to/from stand;sitting/lateral leans  Pt/caregiver will Perform Home Exercise Program Increased strength;Both right and left upper extremity;With written HEP provided;With Supervision;Increased ROM  OT Time Calculation  OT Start Time (ACUTE ONLY) 1116  OT Stop Time (ACUTE ONLY) 1134  OT Time Calculation (min) 18 min  OT General Charges  $OT Visit 1 Visit  OT Treatments  $Self Care/Home Management  8-22 mins  Maurie Boettcher, OT/L   Acute OT Clinical Specialist [place] Pager [phone] Office [phone]

## 2019-12-14 ENCOUNTER — Inpatient Hospital Stay (HOSPITAL_COMMUNITY): Payer: Medicare Other | Admitting: Occupational Therapy

## 2019-12-14 ENCOUNTER — Inpatient Hospital Stay (HOSPITAL_COMMUNITY): Payer: Medicare Other

## 2019-12-14 DIAGNOSIS — K559 Vascular disorder of intestine, unspecified: Secondary | ICD-10-CM

## 2019-12-14 DIAGNOSIS — I4891 Unspecified atrial fibrillation: Secondary | ICD-10-CM

## 2019-12-14 DIAGNOSIS — E871 Hypo-osmolality and hyponatremia: Secondary | ICD-10-CM

## 2019-12-14 DIAGNOSIS — R5381 Other malaise: Secondary | ICD-10-CM

## 2019-12-14 DIAGNOSIS — I779 Disorder of arteries and arterioles, unspecified: Secondary | ICD-10-CM

## 2019-12-14 MED ORDER — PROCHLORPERAZINE MALEATE 5 MG PO TABS: 5.0000 mg | ORAL_TABLET | Freq: Four times a day (QID) | ORAL | Status: DC | PRN

## 2019-12-14 MED ORDER — FUROSEMIDE 20 MG PO TABS
20.0000 mg | ORAL_TABLET | Freq: Once | ORAL | Status: AC
  Administered 2019-12-14: 20 mg via ORAL
  Filled 2019-12-14: qty 1

## 2019-12-14 NOTE — Progress Notes (Signed)
[place] PHYSICAL MEDICINE & REHABILITATION PROGRESS NOTE   Subjective/Complaints: Had a pretty good night. Had to get up to urinate a couple times. Describes inguinal irritation  ROS: Patient denies fever, rash, sore throat, blurred vision, nausea, vomiting, diarrhea, cough, shortness of breath or chest pain,  headache, or mood change.    Objective:   No results found. Recent Labs    12/12/19 0251 12/13/19 0323  WBC 12.6* 11.5*  HGB 8.0* 8.5*  HCT 24.6* 25.6*  PLT 102* 154   Recent Labs    12/13/19 0323  NA 132*  K 3.8  CL 106  CO2 21*  GLUCOSE 106*  BUN 24*  CREATININE 1.26*  CALCIUM 7.2*   No intake or output data in the 24 hours ending 12/14/19 1003   Physical Exam: Vital Signs Blood pressure (!) 113/59, pulse (!) 105, temperature 98 F (36.7 C), temperature source Oral, resp. rate 19, height 4\' 11"  (1.499 m), weight 84.5 kg, SpO2 95 %.  General: Alert and oriented x 3, No apparent distress, obese HEENT: Head is normocephalic, atraumatic, PERRLA, EOMI, sclera anicteric, oral mucosa pink and moist, dentition intact, ext ear canals clear,  Neck: Supple without JVD or lymphadenopathy Heart: IRR IRR. No murmurs rubs or gallops Chest: CTA bilaterally without wheezes, rales, or rhonchi; no distress Abdomen: Soft, non-tender, non-distended, bowel sounds positive. Extremities: No clubbing, cyanosis, or edema. Pulses are 2+ Skin: RLE incision CDI. Some irritation in right inguinal incision, moist Neuro: Pt is cognitively appropriate with normal insight, memory, and awareness. Cranial nerves 2-12 are intact. Sensory exam is normal. Reflexes are 2+ in all 4's. Fine motor coordination is intact. No tremors. Motor function is grossly 5/5 UE. RLE limited by pain 3-4/5. LLE 4/5.  Musculoskeletal: Full ROM, No pain with AROM or PROM in the neck, trunk, or extremities. Posture appropriate Psych: Pt's affect is appropriate. Pt is cooperative      Assessment/Plan: 1.  Functional deficits secondary to PAD s/p right FPBG which require 3+ hours per day of interdisciplinary therapy in a comprehensive inpatient rehab setting.  Physiatrist is providing close team supervision and 24 hour management of active medical problems listed below.  Physiatrist and rehab team continue to assess barriers to discharge/monitor patient progress toward functional and medical goals  Care Tool:  Bathing              Bathing assist       Upper Body Dressing/Undressing Upper body dressing   What is the patient wearing?: Hospital gown only    Upper body assist      Lower Body Dressing/Undressing Lower body dressing            Lower body assist       Toileting Toileting    Toileting assist Assist for toileting: Minimal Assistance - Patient > 75%     Transfers Chair/bed transfer  Transfers assist           Locomotion Ambulation   Ambulation assist              Walk 10 feet activity   Assist           Walk 50 feet activity   Assist           Walk 150 feet activity   Assist           Walk 10 feet on uneven surface  activity   Assist           Wheelchair  Assist               Wheelchair 50 feet with 2 turns activity    Assist            Wheelchair 150 feet activity     Assist          Blood pressure (!) 113/59, pulse (!) 105, temperature 98 F (36.7 C), temperature source Oral, resp. rate 19, height 4\' 11"  (1.499 m), weight 84.5 kg, SpO2 95 %.  Medical Problem List and Plan: 1.  Decreased functional mobility secondary to PVD status post redo right femoral to below-knee popliteal bypass as well as left common femoral enterectomy 12/09/2019.  Follow-up vascular surgery Dr. Oneida Alar             -patient may shower covering right lower extremity             -ELOS/Goals: 7-10 days/supervision/mod I             beginning therapies today 2.   Antithrombotics: -DVT/anticoagulation: Eliquis             -antiplatelet therapy: Aspirin 81 mg daily 3.  Failed back syndrome/pain Management:  Oxycodone as needed             Monitor with increased exertion 4. Mood: Provide emotional support             -antipsychotic agents: N/A 5. Neuropsych: This patient is capable of making decisions on her own behalf. 6. Skin/Wound Care: Routine skin checks  -place wash rag in right groin to help with skin contact 7. Fluids/Electrolytes/Nutrition: Routine in and outs.  CMP ordered.  -recent hyponatremia with downward trend--recheck monday 8.  Acute blood loss anemia.  CBC ordered. 9.  Rectal bleeding .  Follow-up GI.  CT of abdomen consistent with ischemic colitis.             CBC ordered- hgb 8.5 (increased), wbc's down             Cont IV Ancef  -recheck labs monday 10.  Atrial fibrillation RVR.  Follow-up cardiology services.. Continue Toprol-XL 50 mg daily             8/21 HR in 100's. Monitor for pattern, activity tolerance 11.  GERD.  Protonix 12.  COPD.  Check oxygen saturations every shift 13.  Hyperlipidemia. Lipitor 14.  Trigeminal neuralgia.  Maintained on Lamictal 100 mg twice daily per neurology services    LOS: 1 days A FACE TO [place] 12/14/2019, 10:03 AM

## 2019-12-14 NOTE — Evaluation (Signed)
Physical Therapy Assessment and Plan  Patient Details  Name: KRIPA FOSKEY MRN: [id number] Date of Birth: 1925/04/08  PT Diagnosis: Difficulty walking and Muscle weakness Rehab Potential:   ELOS:     Today's Date: 12/14/2019 PT Individual Time: 6301-6010 and 9323-5573 PT Individual Time Calculation (min): 70 min  And 33 min  Hospital Problem: Principal Problem:   PAOD (peripheral arterial occlusive disease) ([place]) Active Problems:   PAD (peripheral artery disease) ([place])   Debility   Past Medical History:  Past Medical History:  Diagnosis Date  . Allergic rhinoconjunctivitis   . Allergy   . Anxiety    pt. managed- uses deep breathing   . Arthritis    low back , stenosis  . COLONIC POLYPS, RECURRENT 08/29/2006   2008 last colonoscopy. No further colonoscopy.     Marland Kitchen COPD (chronic obstructive pulmonary disease) ([place])   . Diverticulosis   . Dysrhythmia    afib  . Eczema   . Environmental allergies    allergy shot- q friday in Dr. Janee Morn office. PFT's abnormal- recommended Spiriva to use preop & will d/c after surgery  . GERD (gastroesophageal reflux disease)   . Hiatal hernia   . Hyperlipidemia   . Hypertension   . Lung nodule 2011  . Paroxysmal atrial fibrillation ([place]) 11/27/2019  . Peripheral vascular disease ([place])   . Shingles   . Stenosis of popliteal artery ([facility])    blood clots in legs long ago    . Trigeminal neuralgia    Left buttocks   Past Surgical History:  Past Surgical History:  Procedure Laterality Date  . ABDOMINAL AORTAGRAM N/A 06/17/2011   Procedure: ABDOMINAL Maxcine Ham;  Surgeon: Elam Dutch, MD;  Location: [facility] CATH LAB;  Service: Cardiovascular;  Laterality: N/A;  . ABDOMINAL AORTOGRAM W/LOWER EXTREMITY Bilateral 06/22/2018   Procedure: ABDOMINAL AORTOGRAM W/LOWER EXTREMITY;  Surgeon: Elam Dutch, MD;  Location: [place] CV LAB;  Service: Cardiovascular;  Laterality: Bilateral;  . ABDOMINAL AORTOGRAM W/LOWER EXTREMITY Bilateral 11/29/2019    Procedure: ABDOMINAL AORTOGRAM W/LOWER EXTREMITY;  Surgeon: Elam Dutch, MD;  Location: [place] CV LAB;  Service: Cardiovascular;  Laterality: Bilateral;  . CATARACT EXTRACTION, BILATERAL     w IOL  . CHOLECYSTECTOMY  1998  . COLONOSCOPY    . ENDARTERECTOMY FEMORAL Left 12/09/2019   Procedure: ENDARTERECTOMY FEMORAL with bovine patch angioplasty.;  Surgeon: Elam Dutch, MD;  Location: [facility] OR;  Service: Vascular;  Laterality: Left;  . FEMORAL-POPLITEAL BYPASS GRAFT        x2 surgeries 1990's & 2009  . FEMORAL-POPLITEAL BYPASS GRAFT Right 12/09/2019   Procedure: REDO RIGHT FEMORAL-POPLITEAL ARTERY BYPASS GRAFT;  Surgeon: Elam Dutch, MD;  Location: [place];  Service: Vascular;  Laterality: Right;  . INJECTION KNEE Right Aug. 2016   Gel injection for pain  . LUMBAR FUSION  07/06/2011  . TONSILLECTOMY     as a teenager   . TUBAL LIGATION      Assessment & Plan Clinical Impression: Patient is a  86 year old right-handed female with history of hypertension, hyperlipidemia, failed back syndrome, paroxysmal atrial fibrillation maintained on chronic Coumadin followed by cardiology services Dr. Oval Linsey, COPD as well as peripheral vascular disease.  History taken from chart review and patient.  Patient lives alone.  Modified independent using straight point cane.  Performing all ADLs and mobility tasks.  1 level home with 2 steps to entry.  She does have a son and daughter in the area.  About 4 to  6 weeks ago patient was having difficulty with ambulation with increasing rest pain to the right foot.  She had previously undergone right femoral to above-knee popliteal bypass 2006 by [name] revised in 2010 as well as 1 prior thrombolysis procedure to the right leg.  She had an arteriogram in February 2020 that showed 80% narrowing of the midportion of her right femoral-popliteal bypass graft.  She also had a 70% stenosis of her left common femoral artery.  Patient was admitted 12/09/2019  and underwent redo right femoral to below-knee popliteal bypass as well as left common femoral enterectomy bovine pericardial patch on 12/09/2019 per Dr. Oneida Alar.  Postoperatively patient did develop some painless rectal bleeding 12/10/2019 with GI consulted Dr.Nandigmam and underwent CT of the abdomen findings consistent with ischemic colitis and close monitoring of hemoglobin 8.5 and does remain on intravenous Ancef.  Hospital course further complicated by atrial fibrillation with RVR, cardiology consulted, initially held for revascularization and initially placed on intravenous heparin held due to anemia and transitioned to [facility] 12/13/2019 and heparin  stopped.   Patient transferred to CIR on 12/13/2019 .   Patient currently requires min with mobility secondary to muscle weakness, decreased cardiorespiratoy endurance and decreased sitting balance, decreased standing balance, decreased postural control and decreased balance strategies.  Prior to hospitalization, patient was modified independent  with mobility and lived with Alone in a House home.  Home access is 2Stairs to enter.  Patient will benefit from skilled PT intervention to maximize safe functional mobility, minimize fall risk and decrease caregiver burden for planned discharge home with intermittent assist.  Anticipate patient will benefit from follow up [facility] at discharge.      PT Evaluation Precautions/Restrictions Precautions Precautions: Fall Precaution Comments: watch HR  Restrictions Weight Bearing Restrictions: No General Chart Reviewed: Yes Family/Caregiver Present: Yes Vital Signs Pain Pain Assessment Pain Scale: 0-10 Home Living/Prior Functioning Home Living Available Help at Discharge: Family;Available PRN/intermittently Type of Home: House Home Access: Stairs to enter [employer] of Steps: 2 Entrance Stairs-Rails: Right;Left Home Layout: One level Bathroom Shower/Tub: Walk-in shower  Lives With:  Alone Prior Function Level of Independence: Independent with transfers;Independent with homemaking with ambulation;Independent with gait  Able to Take Stairs?: Yes Driving: Yes Comments: uses cane in community Vision/Perception  Perception Perception: Within Functional Limits Praxis Praxis: Intact  Cognition Overall Cognitive Status: Within Functional Limits for tasks assessed Arousal/Alertness: Awake/alert Orientation Level: Oriented X4 Safety/Judgment: Appears intact Sensation Sensation Light Touch: Appears Intact Coordination Gross Motor Movements are Fluid and Coordinated: Yes Fine Motor Movements are Fluid and Coordinated: Yes Motor  Motor Motor: Within Functional Limits Motor - Skilled Clinical Observations: generalized weakness and swelling  Trunk/Postural Assessment  Cervical Assessment Cervical Assessment: Within Functional Limits Thoracic Assessment Thoracic Assessment: Within Functional Limits Lumbar Assessment Lumbar Assessment:  (posterior pelvic tilt) Postural Control Postural Control: Within Functional Limits  Balance Balance Balance Assessed: Yes Static Sitting Balance Static Sitting - Level of Assistance: 6: Modified independent (Device/Increase time) Dynamic Sitting Balance Dynamic Sitting - Level of Assistance: 5: Stand by assistance Static Standing Balance Static Standing - Balance Support: No upper extremity supported Static Standing - Level of Assistance: 4: Min assist Dynamic Standing Balance Dynamic Standing - Balance Support: Bilateral upper extremity supported Dynamic Standing - Level of Assistance: 4: Min assist Extremity Assessment      RLE Assessment General Strength Comments: Grossly 3+/5. Endurance impaired. LLE Assessment General Strength Comments: Grossly 3+/5. Endurance impaired.  Care Tool Care Tool Bed Mobility Roll left and  right activity   Roll left and right assist level: Supervision/Verbal cueing    Sit to lying  activity   Sit to lying assist level: Minimal Assistance - Patient > 75%    Lying to sitting edge of bed activity   Lying to sitting edge of bed assist level: Contact Guard/Touching assist     Care Tool Transfers Sit to stand transfer   Sit to stand assist level: Minimal Assistance - Patient > 75%    Chair/bed transfer   Chair/bed transfer assist level: Minimal Assistance - Patient > 75%     Toilet transfer   Assist Level: Minimal Assistance - Patient > 75%    Car transfer   Car transfer assist level: Minimal Assistance - Patient > 75%      Care Tool Locomotion Ambulation   Assist level: Minimal Assistance - Patient > 75% Assistive device: Walker-rolling Max distance: 50'  Walk 10 feet activity   Assist level: Minimal Assistance - Patient > 75% Assistive device: Walker-rolling   Walk 50 feet with 2 turns activity   Assist level: Minimal Assistance - Patient > 75% Assistive device: Walker-rolling  Walk 150 feet activity Walk 150 feet activity did not occur: Safety/medical concerns      Walk 10 feet on uneven surfaces activity Walk 10 feet on uneven surfaces activity did not occur: Safety/medical concerns      Stairs   Assist level: Minimal Assistance - Patient > 75% Stairs assistive device: 2 hand rails Max number of stairs: 3  Walk up/down 1 step activity   Walk up/down 1 step (curb) assist level: Minimal Assistance - Patient > 75% Walk up/down 1 step or curb assistive device: 2 hand rails Walk up/down 4 steps activity did not occuR: Safety/medical concerns  Walk up/down 4 steps activity      Walk up/down 12 steps activity Walk up/down 12 steps activity did not occur: Safety/medical concerns      Pick up small objects from floor Pick up small object from the floor (from standing position) activity did not occur: Safety/medical concerns      Wheelchair Will patient use wheelchair at discharge?: No          Wheel 50 feet with 2 turns activity      Wheel 150  feet activity        Refer to Care Plan for Long Term Goals  SHORT TERM GOAL WEEK 1 PT Short Term Goal 1 (Week 1): STGs=LTGs due to ELOS  Recommendations for other services: None   Skilled Therapeutic Intervention  1st Session: Evaluation completed (see details above and below) with education on PT POC and goals and individual treatment initiated with focus on bed mobility, balance, transfers, and ambulation.  Pt received supine in bed and agreeable to therapy. No report of pain. Pt performs bilateral rolling with use of bedrails to assist with changing wet brief and pants. Supine to sit with bed rails and verbal cues on positioning and body mechanics. Pt performs stand pivot transfer to [place] with minA HHA, and has slight LOB backward during transfer.   Pt participates in "dance group" to work on endurance and core strengthening, performing large amplitude BUE movements while seated in WC.  WC transport to gym for time management. Pt performs car transfer with minA and additional LOB backward. PT cues on anterior weight shifting for improved balance and stability while on feet. Pt performs several more sit to stand transfers for functional strengthening and transfer training, requiring mina/CGA for  safety and cues on hand placement and body mechanics.  Pt left seated in WC with alarm intact and all needs within reach.  2nd Session: Pt received supine in bed and agreeable to therapy. No report of pain. Supine to sit with bedrails and supervision. Stand pivot transfer to [facility] with CGA. WC transport to gym for time management. Pt ambulates 30' with RW and minA, with cues to decrease WB through RW for energy conservation and manual facilitation of lateral weight shifting. During seated rest break, PT changes RW to junior RW for better fit, and pt then ambulates 50' with minA.   Pt ascends/descends 3 steps with BHR following PT demonstration of correct technique. PT provides minA at hips for safety.  Pt somewhat SOB following stairs but verbalizes feeling that her breathing is improved from earlier in day. Pt performs SPT back to bed with CGA. Sit to supine with minA management of BLEs. Pt left supine in bed with alarm intact and all needs within reach.   Mobility Bed Mobility Bed Mobility: Sit to Supine;Supine to Sit Supine to Sit: Supervision/Verbal cueing Sit to Supine: Minimal Assistance - Patient > 75% Transfers Transfers: Sit to Stand;Stand to Sit;Stand Pivot Transfers Sit to Stand: Minimal Assistance - Patient > 75% Stand to Sit: Minimal Assistance - Patient > 75% Stand Pivot Transfers: Minimal Assistance - Patient > 75% Stand Pivot Transfer Details: Verbal cues for technique;Tactile cues for sequencing;Verbal cues for sequencing Transfer (Assistive device): None Locomotion  Gait Ambulation: Yes Gait Assistance: Minimal Assistance - Patient > 75% Gait Distance (Feet): 50 Feet Assistive device: Rolling walker Gait Assistance Details: Tactile cues for posture;Verbal cues for sequencing;Verbal cues for gait pattern;Verbal cues for safe use of DME/AE;Verbal cues for technique Gait Gait: Yes Gait Pattern: Impaired Gait Pattern: Shuffle Gait velocity: decreased Stairs / Additional Locomotion Stairs: Yes Stairs Assistance: Minimal Assistance - Patient > 75% Stair Management Technique: Two rails Number of Stairs: 3 Height of Stairs: 6 Curb: Minimal Assistance - Patient >75% Wheelchair Mobility Wheelchair Mobility: No   Discharge Criteria: Patient will be discharged from PT if patient refuses treatment 3 consecutive times without medical reason, if treatment goals not met, if there is a change in medical status, if patient makes no progress towards goals or if patient is discharged from hospital.  The above assessment, treatment plan, treatment alternatives and goals were discussed and mutually agreed upon: by patient and by family  Breck Coons 12/14/2019, 3:40 PM

## 2019-12-14 NOTE — Progress Notes (Signed)
Occupational Therapy Session Note  Patient Details  Name: JANNELL FRANTA MRN: [id number] Date of Birth: 02/06/1925  Today's Date: 12/14/2019 OT Individual Time: 1300-1345 OT Individual Time Calculation (min): 45 min    Short Term Goals: Week 1:  OT Short Term Goal 1 (Week 1): STG=LTG  Skilled Therapeutic Interventions/Progress Updates:    1:1 Pt requesting to void when arrived. Min guard transfer with cues for safe placement to and from the the toilet. Pt able to perform clothing manage of paper pants but not brief. Pt reports felling tired and winded with activity but willing to try. Ambulated ~15 feet with RW with contact guard with cues for use of RW. Transitioned onto [place] - was only able to perform 2 min and 55 second before needing to stop on resistance level 2 due to being SOB. Pt's O2 100% and HR 92-100 allowed for prolonged rest break and returned to the room. Ambulated ~15 feet to the bed and transitioned into the bed with min A. Left resting in the bed and shared concerns of SOB with RN. LEs elevated.   Therapy Documentation Precautions:  Precautions Precautions: Fall Precaution Comments: watch HR  Restrictions Weight Bearing Restrictions: No   Pain: Pain Assessment Pain Scale: 0-10 ADL: ADL Eating: Independent Grooming: Setup Where Assessed-Grooming: Edge of bed Upper Body Bathing: Setup Lower Body Bathing: Moderate assistance Where Assessed-Lower Body Bathing: Edge of bed Upper Body Dressing: Setup Where Assessed-Upper Body Dressing: Edge of bed Lower Body Dressing: Maximal assistance Where Assessed-Lower Body Dressing: Edge of bed Toilet Transfer: Minimal assistance Social research officer, government Method: Unable to assess Vision Baseline Vision/History: No visual deficits;Wears glasses Perception  Perception: Within Functional Limits Praxis Praxis: Intact Exercises:     Therapy/Group: Individual Therapy  Willeen Cass [facility] 12/14/2019, 3:03 PM

## 2019-12-14 NOTE — Evaluation (Signed)
Occupational Therapy Assessment and Plan  Patient Details  Name: [name] [name] MRN: [id number] Date of Birth: 17-Jul-1924  OT Diagnosis: muscle weakness (generalized) Rehab Potential: Rehab Potential (ACUTE ONLY): Good ELOS: ~ 7 days   Today's Date: 12/14/2019 OT Individual Time: 3154-0086 OT Individual Time Calculation (min): 55 min     Hospital Problem: Principal Problem:   PAOD (peripheral arterial occlusive disease) ([place]) Active Problems:   PAD (peripheral artery disease) ([place])   Debility   Past Medical History:  Past Medical History:  Diagnosis Date  . Allergic rhinoconjunctivitis   . Allergy   . Anxiety    pt. managed- uses deep breathing   . Arthritis    low back , stenosis  . COLONIC POLYPS, RECURRENT 08/29/2006   2008 last colonoscopy. No further colonoscopy.     Marland Kitchen COPD (chronic obstructive pulmonary disease) ([place])   . Diverticulosis   . Dysrhythmia    afib  . Eczema   . Environmental allergies    allergy shot- q friday in Dr. Janee Morn office. PFT's abnormal- recommended Spiriva to use preop & will d/c after surgery  . GERD (gastroesophageal reflux disease)   . Hiatal hernia   . Hyperlipidemia   . Hypertension   . Lung nodule 2011  . Paroxysmal atrial fibrillation ([place]) 11/27/2019  . Peripheral vascular disease ([place])   . Shingles   . Stenosis of popliteal artery ([facility])    blood clots in legs long ago    . Trigeminal neuralgia    Left buttocks   Past Surgical History:  Past Surgical History:  Procedure Laterality Date  . ABDOMINAL AORTAGRAM N/A 06/17/2011   Procedure: ABDOMINAL Maxcine Ham;  Surgeon: Elam Dutch, MD;  Location: [facility] CATH LAB;  Service: Cardiovascular;  Laterality: N/A;  . ABDOMINAL AORTOGRAM W/LOWER EXTREMITY Bilateral 06/22/2018   Procedure: ABDOMINAL AORTOGRAM W/LOWER EXTREMITY;  Surgeon: Elam Dutch, MD;  Location: [place] CV LAB;  Service: Cardiovascular;  Laterality: Bilateral;  . ABDOMINAL AORTOGRAM W/LOWER EXTREMITY  Bilateral 11/29/2019   Procedure: ABDOMINAL AORTOGRAM W/LOWER EXTREMITY;  Surgeon: Elam Dutch, MD;  Location: [place] CV LAB;  Service: Cardiovascular;  Laterality: Bilateral;  . CATARACT EXTRACTION, BILATERAL     w IOL  . CHOLECYSTECTOMY  1998  . COLONOSCOPY    . ENDARTERECTOMY FEMORAL Left 12/09/2019   Procedure: ENDARTERECTOMY FEMORAL with bovine patch angioplasty.;  Surgeon: Elam Dutch, MD;  Location: [facility] OR;  Service: Vascular;  Laterality: Left;  . FEMORAL-POPLITEAL BYPASS GRAFT        x2 surgeries 1990's & 2009  . FEMORAL-POPLITEAL BYPASS GRAFT Right 12/09/2019   Procedure: REDO RIGHT FEMORAL-POPLITEAL ARTERY BYPASS GRAFT;  Surgeon: Elam Dutch, MD;  Location: [place] [facility];  Service: Vascular;  Laterality: Right;  . INJECTION KNEE Right Aug. 2016   Gel injection for pain  . LUMBAR FUSION  07/06/2011  . TONSILLECTOMY     as a teenager   . TUBAL LIGATION      Assessment & Plan Clinical Impression: Patient is a 86 y.o. year old female right-handed female with history of hypertension, hyperlipidemia, failed back syndrome, paroxysmal atrial fibrillation maintained on chronic Coumadin followed by cardiology services Dr. Oval Linsey, COPD as well as peripheral vascular disease.  History taken from chart review and patient.  Patient lives alone.  Modified independent using straight point cane.  Performing all ADLs and mobility tasks.  1 level home with 2 steps to entry.  She does have a son and daughter in the area.  About  4 to 6 weeks ago patient was having difficulty with ambulation with increasing rest pain to the right foot.  She had previously undergone right femoral to above-knee popliteal bypass 2006 by [name] revised in 2010 as well as 1 prior thrombolysis procedure to the right leg.  She had an arteriogram in February 2020 that showed 80% narrowing of the midportion of her right femoral-popliteal bypass graft.  She also had a 70% stenosis of her left common femoral artery.   Patient was admitted 12/09/2019 and underwent redo right femoral to below-knee popliteal bypass as well as left common femoral enterectomy bovine pericardial patch on 12/09/2019 per Dr. Oneida Alar.  Postoperatively patient did develop some painless rectal bleeding 12/10/2019 with GI consulted Dr.Nandigmam and underwent CT of the abdomen findings consistent with ischemic colitis and close monitoring of hemoglobin 8.5 and does remain on intravenous Ancef.  Hospital course further complicated by atrial fibrillation with RVR, cardiology consulted, initially held for revascularization and initially placed on intravenous heparin held due to anemia and transitioned to [facility] 12/13/2019 and heparin  stopped.Patient transferred to CIR on 12/13/2019 .    Patient currently requires min with basic self-care skills and basic mobility  secondary to muscle weakness, decreased cardiorespiratoy endurance and decreased standing balance and decreased balance strategies.  Prior to hospitalization, patient could complete ADL with independent .  Patient will benefit from skilled intervention to decrease level of assist with basic self-care skills and increase independence with basic self-care skills prior to discharge home with care partner.  Anticipate patient will require intermittent supervision and follow up home health.   OT - End of Session Activity Tolerance: Tolerates 30+ min activity with multiple rests Endurance Deficit: Yes OT Assessment Rehab Potential (ACUTE ONLY): Good OT Patient demonstrates impairments in the following area(s): Balance;Edema;Motor;Pain;Sensory;Skin Integrity OT Basic ADL's Functional Problem(s): Bathing;Dressing;Toileting OT Transfers Functional Problem(s): Toilet;Tub/Shower OT Additional Impairment(s): None OT Plan OT Intensity: Minimum of 1-2 x/day, 45 to 90 minutes OT Frequency: 5 out of 7 days OT Duration/Estimated Length of Stay: ~ 7 days OT Treatment/Interventions: Balance/vestibular  training;DME/adaptive equipment instruction;Patient/family education;Therapeutic Activities;Cognitive remediation/compensation;Psychosocial support;Therapeutic Exercise;Community reintegration;Functional mobility training;Self Care/advanced ADL retraining;UE/LE Strength taining/ROM;Discharge planning;Neuromuscular re-education;Skin care/wound managment;UE/LE Coordination activities;Disease mangement/prevention;Pain management;Splinting/orthotics OT Self Feeding Anticipated Outcome(s): n/a OT Basic Self-Care Anticipated Outcome(s): supervision OT Toileting Anticipated Outcome(s): mod I OT Bathroom Transfers Anticipated Outcome(s): mod I OT Recommendation Patient destination: Home Follow Up Recommendations: Home health OT Equipment Recommended: To be determined   OT Evaluation Precautions/Restrictions  Precautions Precautions: Fall Restrictions Weight Bearing Restrictions: No General Chart Reviewed: Yes Family/Caregiver Present: Yes (sonarrived in session) Vital Signs  Pain Pain Assessment Pain Score: 0-No pain Home Living/Prior Functioning Home Living Family/patient expects to be discharged to:: Private residence Living Arrangements: Alone Available Help at Discharge: Family, Available PRN/intermittently Type of Home: House Home Access: Stairs to enter [employer] of Steps: 2 Entrance Stairs-Rails: Right, Left Home Layout: One level Bathroom Shower/Tub: Walk-in shower  Lives With: Alone Vision Baseline Vision/History: No visual deficits;Wears glasses Wears Glasses: Reading only Patient Visual Report: No change from baseline Vision Assessment?: No apparent visual deficits Perception  Perception: Within Functional Limits Praxis Praxis: Intact Cognition Overall Cognitive Status: Within Functional Limits for tasks assessed Arousal/Alertness: Awake/alert Orientation Level: Person;Place;Situation Person: Oriented Place: Oriented Situation: Oriented Year:  2021 Month: August Day of Week: Correct Memory: Appears intact Immediate Memory Recall: Sock;Blue;Bed Memory Recall Sock: Without Cue Memory Recall Blue: Without Cue Memory Recall Bed: With Cue Attention: Focused;Sustained;Selective Focused Attention: Appears intact Sustained Attention:  Appears intact Selective Attention: Appears intact Awareness: Appears intact Problem Solving: Appears intact Safety/Judgment: Appears intact Sensation Sensation Light Touch: Appears Intact Proprioception: Appears Intact Coordination Gross Motor Movements are Fluid and Coordinated: Yes Fine Motor Movements are Fluid and Coordinated: Yes Motor  Motor Motor - Skilled Clinical Observations: generalized weakness and swelling  Trunk/Postural Assessment  Cervical Assessment Cervical Assessment: Within Functional Limits Thoracic Assessment Thoracic Assessment: Within Functional Limits Lumbar Assessment Lumbar Assessment:  (posterior pelvic tilt) Postural Control Postural Control: Within Functional Limits  Balance Balance Balance Assessed: Yes Static Sitting Balance Static Sitting - Level of Assistance: 6: Modified independent (Device/Increase time) Dynamic Sitting Balance Dynamic Sitting - Level of Assistance: 5: Stand by assistance Static Standing Balance Static Standing - Balance Support: No upper extremity supported Static Standing - Level of Assistance: 4: Min assist Dynamic Standing Balance Dynamic Standing - Balance Support: Bilateral upper extremity supported Dynamic Standing - Level of Assistance: 4: Min assist Extremity/Trunk Assessment RUE Assessment RUE Assessment: Within Functional Limits LUE Assessment LUE Assessment: Within Functional Limits  Care Tool Care Tool Self Care Eating    I     Oral Care    Oral Care Assist Level: Set up assist    Bathing   Body parts bathed by patient: Right arm;Left arm;Chest;Abdomen;Front perineal area;Buttocks;Right upper leg;Left upper  leg;Face Body parts bathed by helper: Right lower leg;Left lower leg   Assist Level: Minimal Assistance - Patient > 75%    Upper Body Dressing(including orthotics)   What is the patient wearing?: Pull over shirt   Assist Level: Contact Guard/Touching assist (due to IV)    Lower Body Dressing (excluding footwear)   What is the patient wearing?: Pants;Incontinence brief Assist for lower body dressing: Maximal Assistance - Patient 25 - 49%    Putting on/Taking off footwear   What is the patient wearing?: Non-skid slipper socks Assist for footwear: Total Assistance - Patient < 25%       Care Tool Toileting Toileting activity   Assist for toileting: Minimal Assistance - Patient > 75%     Care Tool Bed Mobility Roll left and right activity    Mod I     Sit to lying activity  didn't test       Lying to sitting edge of bed activity   Lying to sitting edge of bed assist level: Contact Guard/Touching assist     Care Tool Transfers Sit to stand transfer   Sit to stand assist level: Minimal Assistance - Patient > 75%    Chair/bed transfer   Chair/bed transfer assist level: Minimal Assistance - Patient > 75%     Toilet transfer   Assist Level: Minimal Assistance - Patient > 75%     Care Tool Cognition Expression of Ideas and Wants Expression of Ideas and Wants: Without difficulty (complex and basic) - expresses complex messages without difficulty and with speech that is clear and easy to understand   Understanding Verbal and Non-Verbal Content Understanding Verbal and Non-Verbal Content: Understands (complex and basic) - clear comprehension without cues or repetitions   Memory/Recall Ability *first 3 days only Memory/Recall Ability *first 3 days only: Current season;Location of own room;Staff names and faces;That he or she is in a hospital/hospital unit    Refer to Care Plan for Long Term Goals  SHORT TERM GOAL WEEK 1 OT Short Term Goal 1 (Week 1):  STG=LTG  Recommendations for other services: None    Skilled Therapeutic Intervention Ot eval initiated with pt and pt's son.  Pt reports feeling nauseated after eating and this has been happening- made RN aware. Pt agreeable to therapy. Pt came to EOB with extra time with supervision. Engaged in self care retraining at EOB to including bathing and dressing. Pt did report having difficulty PTA to sometimes having difficulty threading pants and due to swelling and decr ROM in LEs assisted was needed. Focus on sit to stands from EOB with min A progressing to contact guard. Pt able to maintain balance in standing with contact guard while performing peri care and clothing management. Pt ambulated from EOB to the sink with HHA with min A at slow speed. At sink able to perform all grooming tasks with setup. Contact guard stand pivot transfer into w/c for energy conservation. Pt with labored breathing throughout session with activity but reports can continue. Pt left sitting up in w/c at end of session.    ADL ADL Eating: Independent Grooming: Setup Where Assessed-Grooming: Edge of bed Upper Body Bathing: Setup Lower Body Bathing: Moderate assistance Where Assessed-Lower Body Bathing: Edge of bed Upper Body Dressing: Setup Where Assessed-Upper Body Dressing: Edge of bed Lower Body Dressing: Maximal assistance Where Assessed-Lower Body Dressing: Edge of bed Toilet Transfer: Minimal assistance Social research officer, government Method: Unable to assess Mobility  Bed Mobility Bed Mobility: Supine to Sit;Sitting - Scoot to Edge of Bed Supine to Sit: Minimal Assistance - Patient > 75% Sitting - Scoot to [employer] of Bed: Contact Guard/Touching assist Transfers Sit to Stand: Minimal Assistance - Patient > 75% Stand to Sit: Contact Guard/Touching assist   Discharge Criteria: Patient will be discharged from OT if patient refuses treatment 3 consecutive times without medical reason, if treatment goals not met, if  there is a change in medical status, if patient makes no progress towards goals or if patient is discharged from hospital.  The above assessment, treatment plan, treatment alternatives and goals were discussed and mutually agreed upon: by patient and by family  Nicoletta Ba 12/14/2019, 12:14 PM

## 2019-12-15 MED ORDER — SACCHAROMYCES BOULARDII 250 MG PO CAPS
250.0000 mg | ORAL_CAPSULE | Freq: Two times a day (BID) | ORAL | Status: DC
  Administered 2019-12-15 – 2019-12-24 (×19): 250 mg via ORAL
  Filled 2019-12-15 (×19): qty 1

## 2019-12-15 MED ORDER — METOPROLOL SUCCINATE ER 25 MG PO TB24
25.0000 mg | ORAL_TABLET | Freq: Once | ORAL | Status: AC
  Administered 2019-12-15: 25 mg via ORAL
  Filled 2019-12-15: qty 1

## 2019-12-15 MED ORDER — METOPROLOL SUCCINATE ER 50 MG PO TB24
75.0000 mg | ORAL_TABLET | Freq: Every day | ORAL | Status: DC
  Administered 2019-12-16 – 2019-12-24 (×9): 75 mg via ORAL
  Filled 2019-12-15 (×9): qty 1

## 2019-12-15 MED ORDER — LOPERAMIDE HCL 2 MG PO CAPS
2.0000 mg | ORAL_CAPSULE | ORAL | Status: DC | PRN
  Administered 2019-12-15: 2 mg via ORAL
  Filled 2019-12-15: qty 1

## 2019-12-15 NOTE — Progress Notes (Addendum)
PHYSICAL MEDICINE & REHABILITATION PROGRESS NOTE   Subjective/Complaints: Reports urinary frequency and loose stool. Denies any breathing issues this morning  ROS: Patient denies fever, rash, sore throat, blurred vision, nausea, vomiting, diarrhea, cough, shortness of breath or chest pain,  headache, or mood change.    Objective:   DG Chest 2 View  Result Date: 12/14/2019 CLINICAL DATA:  Shortness of breath EXAM: CHEST - 2 VIEW COMPARISON:  05/28/2018 FINDINGS: Frontal and lateral views of the chest demonstrate an unremarkable cardiac silhouette given AP positioning. There is blunting of the costophrenic angles posteriorly, consistent with bilateral lower lobe consolidation and small effusions. Favor atelectasis. No pneumothorax. No acute bony abnormalities. IMPRESSION: 1. Blunting of the posterior costophrenic angles consistent with bilateral lower lobe atelectasis and/or small effusions. Electronically Signed   By: Randa Ngo M.D.   On: 12/14/2019 19:13   Recent Labs    12/13/19 0323  WBC 11.5*  HGB 8.5*  HCT 25.6*  PLT 154   Recent Labs    12/13/19 0323  NA 132*  K 3.8  CL 106  CO2 21*  GLUCOSE 106*  BUN 24*  CREATININE 1.26*  CALCIUM 7.2*    Intake/Output Summary (Last 24 hours) at 12/15/2019 0945 Last data filed at 12/15/2019 0739 Gross per 24 hour  Intake 834.46 ml  Output --  Net 834.46 ml     Physical Exam: Vital Signs Blood pressure 131/65, pulse 100, temperature 99 F (37.2 C), temperature source Oral, resp. rate 19, height 4\' 11"  (1.499 m), weight 82.7 kg, SpO2 97 %.  Constitutional: No distress . Vital signs reviewed. HEENT: EOMI, oral membranes moist Neck: supple Cardiovascular: RRR without murmur. No JVD    Respiratory/Chest: CTA Bilaterally without wheezes or rales. Normal effort    GI/Abdomen: BS +, non-tender, non-distended Ext: no clubbing, cyanosis, 1+ RLE edema Psych: pleasant and cooperative Skin: RLE incision CDI. Some  irritation around right inguinal incision, moist Neuro: Pt is cognitively appropriate with normal insight, memory, and awareness. Cranial nerves 2-12 are intact. Sensory exam is normal. Reflexes are 2+ in all 4's. Fine motor coordination is intact. No tremors. Motor function is grossly 5/5 UE. RLE limited by pain 3-4/5. LLE 4/5.  Musculoskeletal: Full ROM, No pain with AROM or PROM in the neck, trunk, or extremities. Posture appropriate Psych: Pt's affect is appropriate. Pt is cooperative      Assessment/Plan: 1. Functional deficits secondary to PAD s/p right FPBG which require 3+ hours per day of interdisciplinary therapy in a comprehensive inpatient rehab setting.  Physiatrist is providing close team supervision and 24 hour management of active medical problems listed below.  Physiatrist and rehab team continue to assess barriers to discharge/monitor patient progress toward functional and medical goals  Care Tool:  Bathing    Body parts bathed by patient: Right arm, Left arm, Chest, Abdomen, Front perineal area, Buttocks, Right upper leg, Left upper leg, Face   Body parts bathed by helper: Right lower leg, Left lower leg     Bathing assist Assist Level: Minimal Assistance - Patient > 75%     Upper Body Dressing/Undressing Upper body dressing Upper body dressing/undressing activity did not occur (including orthotics): N/A What is the patient wearing?: Pull over shirt    Upper body assist Assist Level: Contact Guard/Touching assist (due to IV)    Lower Body Dressing/Undressing Lower body dressing      What is the patient wearing?: Incontinence brief     Lower body assist Assist for lower  body dressing: Maximal Assistance - Patient 25 - 49%     Toileting Toileting    Toileting assist Assist for toileting: Minimal Assistance - Patient > 75%     Transfers Chair/bed transfer  Transfers assist     Chair/bed transfer assist level: Moderate Assistance - Patient 50 -  74%     Locomotion Ambulation   Ambulation assist      Assist level: Minimal Assistance - Patient > 75% Assistive device: Walker-rolling Max distance: 50'   Walk 10 feet activity   Assist     Assist level: Minimal Assistance - Patient > 75% Assistive device: Walker-rolling   Walk 50 feet activity   Assist    Assist level: Minimal Assistance - Patient > 75% Assistive device: Walker-rolling    Walk 150 feet activity   Assist Walk 150 feet activity did not occur: Safety/medical concerns         Walk 10 feet on uneven surface  activity   Assist Walk 10 feet on uneven surfaces activity did not occur: Safety/medical concerns         Wheelchair     Assist Will patient use wheelchair at discharge?: No             Wheelchair 50 feet with 2 turns activity    Assist            Wheelchair 150 feet activity     Assist          Blood pressure 131/65, pulse 100, temperature 99 F (37.2 C), temperature source Oral, resp. rate 19, height 4\' 11"  (1.499 m), weight 82.7 kg, SpO2 97 %.  Medical Problem List and Plan: 1.  Decreased functional mobility secondary to PVD status post redo right femoral to below-knee popliteal bypass as well as left common femoral enterectomy 12/09/2019.  Follow-up vascular surgery Dr. Oneida Alar             -patient may shower covering right lower extremity             -ELOS/Goals: 7-10 days/supervision/mod I             -Continue CIR therapies including PT, OT  2.  Antithrombotics: -DVT/anticoagulation: Eliquis             -antiplatelet therapy: Aspirin 81 mg daily 3.  Failed back syndrome/pain Management:  Oxycodone as needed             Monitor with increased exertion 4. Mood: Provide emotional support             -antipsychotic agents: N/A 5. Neuropsych: This patient is capable of making decisions on her own behalf. 6. Skin/Wound Care: Routine skin checks  -place wash rag in right groin to help with skin  contact 7. Fluids/Electrolytes/Nutrition: Routine in and outs.  CMP monday  -recent hyponatremia with downward trend--recheck monday 8.  Acute blood loss anemia.  CBC ordered. 9.  Rectal bleeding .  Follow-up GI.  CT of abdomen consistent with ischemic colitis.             CBC ordered- hgb 8.5 (increased), wbc's down             Ancef completed. Last dose flagyl this morning 8/22  -recheck labs monday 10.  Atrial fibrillation RVR.  Follow-up cardiology services.. Continue Toprol-XL 50 mg daily             8/21-22 HR in 100's. Monitor for pattern, activity tolerance   -increase metoprolol to 75mg  daily  8/22 pt reports hx of fluid overload. Feels that she was getting overloaded from fluids with abx    Filed Weights   12/13/19 1727 12/13/19 1840 12/15/19 0328  Weight: 84.5 kg 84.5 kg 82.7 kg      -weights down today from 8/20   -20mg  po lasix given yesterday   -cxr reviewed from 8/21 which demonstrates only LL atelectasis and minimal effusions at corners of lungs   -encouraged pt to use IS, OOB, etc 11.  GERD.  Protonix 12.  COPD.  Check oxygen saturations every shift 13.  Hyperlipidemia. Lipitor 14.  Trigeminal neuralgia.  Maintained on Lamictal 100 mg twice daily per neurology services 15. Loose stool: d/t abx  -abx completed today  -add probiotic, prn imodium    LOS: 2 days A FACE TO FACE EVALUATION WAS PERFORMED  Meredith Staggers 12/15/2019, 9:45 AM

## 2019-12-16 ENCOUNTER — Inpatient Hospital Stay (HOSPITAL_COMMUNITY): Payer: Medicare Other | Admitting: Occupational Therapy

## 2019-12-16 ENCOUNTER — Inpatient Hospital Stay (HOSPITAL_COMMUNITY): Payer: Medicare Other

## 2019-12-16 LAB — CBC WITH DIFFERENTIAL/PLATELET
Abs Immature Granulocytes: 1.23 10*3/uL — ABNORMAL HIGH (ref 0.00–0.07)
Basophils Absolute: 0.1 10*3/uL (ref 0.0–0.1)
Basophils Relative: 1 %
Eosinophils Absolute: 0.2 10*3/uL (ref 0.0–0.5)
Eosinophils Relative: 1 %
HCT: 29.7 % — ABNORMAL LOW (ref 36.0–46.0)
Hemoglobin: 9.6 g/dL — ABNORMAL LOW (ref 12.0–15.0)
Immature Granulocytes: 10 %
Lymphocytes Relative: 12 %
Lymphs Abs: 1.4 10*3/uL (ref 0.7–4.0)
MCH: 29.4 pg (ref 26.0–34.0)
MCHC: 32.3 g/dL (ref 30.0–36.0)
MCV: 91.1 fL (ref 80.0–100.0)
Monocytes Absolute: 1.5 10*3/uL — ABNORMAL HIGH (ref 0.1–1.0)
Monocytes Relative: 12 %
Neutro Abs: 7.8 10*3/uL — ABNORMAL HIGH (ref 1.7–7.7)
Neutrophils Relative %: 64 %
Platelets: 266 10*3/uL (ref 150–400)
RBC: 3.26 MIL/uL — ABNORMAL LOW (ref 3.87–5.11)
RDW: 14.7 % (ref 11.5–15.5)
WBC: 12.2 10*3/uL — ABNORMAL HIGH (ref 4.0–10.5)
nRBC: 0.5 % — ABNORMAL HIGH (ref 0.0–0.2)

## 2019-12-16 LAB — COMPREHENSIVE METABOLIC PANEL
ALT: 8 U/L (ref 0–44)
AST: 33 U/L (ref 15–41)
Albumin: 2.6 g/dL — ABNORMAL LOW (ref 3.5–5.0)
Alkaline Phosphatase: 50 U/L (ref 38–126)
Anion gap: 13 (ref 5–15)
BUN: 7 mg/dL — ABNORMAL LOW (ref 8–23)
CO2: 23 mmol/L (ref 22–32)
Calcium: 8.1 mg/dL — ABNORMAL LOW (ref 8.9–10.3)
Chloride: 104 mmol/L (ref 98–111)
Creatinine, Ser: 0.91 mg/dL (ref 0.44–1.00)
GFR calc Af Amer: >60 mL/min (ref >60)
GFR calc non Af Amer: 54 mL/min — ABNORMAL LOW (ref >60)
Glucose, Bld: 118 mg/dL — ABNORMAL HIGH (ref 70–99)
Potassium: 3.6 mmol/L (ref 3.5–5.1)
Sodium: 140 mmol/L (ref 135–145)
Total Bilirubin: 0.6 mg/dL (ref 0.3–1.2)
Total Protein: 5 g/dL — ABNORMAL LOW (ref 6.5–8.1)

## 2019-12-16 MED ORDER — FUROSEMIDE 10 MG/ML IJ SOLN
40.0000 mg | Freq: Once | INTRAMUSCULAR | Status: AC
  Administered 2019-12-16: 40 mg via INTRAVENOUS
  Filled 2019-12-16: qty 4

## 2019-12-16 MED ORDER — CLONAZEPAM 0.5 MG PO TABS: 0.2500 mg | ORAL_TABLET | Freq: Every day | ORAL | Status: DC | PRN

## 2019-12-16 MED ORDER — POTASSIUM CHLORIDE ER 10 MEQ PO TBCR
60.0000 meq | EXTENDED_RELEASE_TABLET | Freq: Once | ORAL | Status: AC
  Administered 2019-12-16: 60 meq via ORAL
  Filled 2019-12-16 (×2): qty 6

## 2019-12-16 MED ORDER — CLONAZEPAM 0.25 MG PO TBDP
0.2500 mg | ORAL_TABLET | Freq: Every day | ORAL | Status: DC | PRN
  Administered 2019-12-16 – 2019-12-17 (×2): 0.25 mg via ORAL
  Filled 2019-12-16 (×2): qty 1

## 2019-12-16 MED ORDER — CALCIUM CARBONATE-VITAMIN D 500-200 MG-UNIT PO TABS
1.0000 | ORAL_TABLET | Freq: Every day | ORAL | Status: DC
  Administered 2019-12-16: 15:00:00 1 via ORAL
  Filled 2019-12-16 (×2): qty 1

## 2019-12-16 MED ORDER — GUAIFENESIN 100 MG/5ML PO SOLN
5.0000 mL | Freq: Three times a day (TID) | ORAL | Status: DC
  Administered 2019-12-16 – 2019-12-17 (×3): 100 mg via ORAL
  Filled 2019-12-16 (×3): qty 5

## 2019-12-16 NOTE — Progress Notes (Signed)
Patient ID: [name] [name], female   DOB: 10-17-1924, 86 y.o.   MRN: [id number] Met with the patient to review role of nurse case manager and review nursing issues and collaboration with SW Jacqlyn Larsen) to facilitate preparations for discharge. Reviewed current respiratory issues and hx of COPD. Reported she is "wheezing" and short of breath but did not have trouble with this PTA, just after this surgery. She noted understanding of HTN, HLD and risks associated with PAOD. Noted she has had multiple surgical procedures for circulation in her legs however this one is much larger and she is having difficulties with moisture in the groin, abd area. Discussed using derma therapy linen on wound instead of plain cotton washcloth and option for another moisture wicking product however more economical to use linen. Reviewed DASH diet, low fat and cholesterol diet along with medications to manage HTN, and HLD. Reviewed UA order and noted she had been having burning with urination and was using the bedpan at night. Discussed getting up to [facility] instead to fully empty the bladder. Noted low PVI on bladder scans. Plans on going home and son will come in to assist with care. Has hired someone to come into the home a couple of days/week to help out as well. Margarito Liner

## 2019-12-16 NOTE — Progress Notes (Signed)
ANTICOAGULATION CONSULT NOTE -  Pharmacy Consult for: transition from IV heparin to Apixaban Indication: multiple thrombosis/Afib  Allergies  Allergen Reactions  . Sulfa Antibiotics Other (See Comments)    Cold sweat light headed and disorientation  . Tiotropium Bromide Shortness Of Breath and Other (See Comments)    Sore throat also  . Latex Itching and Rash  . Ultram [Tramadol] Nausea And Vomiting  . 5-Alpha Reductase Inhibitors     Patient Measurements: Weight 74.5 kg Height: 4 ft 11 inches  Vital Signs: Temp: 98.4 F (36.9 C) (08/23 0338) BP: 124/78 (08/23 0758) Pulse Rate: 89 (08/23 0338)  Labs: Recent Labs    12/16/19 0458  HGB 9.6*  HCT 29.7*  PLT 266  CREATININE 0.91    Estimated Creatinine Clearance: 35.6 mL/min (by C-G formula based on SCr of 0.91 mg/dL).  Assessment: 86 y.o female who was on warfarin for hx multiple thrombosis/Afib PTA. Warfarin was held for PVD surgery 8/16. 8/18: started IV heparin. Patient then developed rectal bleeding secondary to acute colitis. Hgb 11.6>8>8.5 up today. Plts 102>154. Held IV heparin 24 hrs on 8/19-8/20. Heparin IV drip resumed 8/20.  Patient discharged from [facility] and admitted to CIR.  Pharmacy consulted to transition to oral Apixaban for nonvalvular atrial fibrillation as recommended by cardiologist, Dr. Meda Coffee.   Cr remains stable and wt >60kg. Will continue current dosing and sign off.  Plan:  -Apixaban 5mg  BID -Pharmacy will sign off, reconsult if needed   Arrie Senate, PharmD, BCPS Clinical Pharmacist [phone] Please check AMION for all [place] numbers 12/16/2019

## 2019-12-16 NOTE — Progress Notes (Signed)
Patient Details  Name: CHRISTON GALLAWAY MRN: [id number] Date of Birth: February 14, 1925  Today's Date: 12/16/2019  Hospital Problems: Principal Problem:   PAOD (peripheral arterial occlusive disease) ([place]) Active Problems:   PAD (peripheral artery disease) ([place])   Debility  Past Medical History:  Past Medical History:  Diagnosis Date  . Allergic rhinoconjunctivitis   . Allergy   . Anxiety    pt. managed- uses deep breathing   . Arthritis    low back , stenosis  . COLONIC POLYPS, RECURRENT 08/29/2006   2008 last colonoscopy. No further colonoscopy.     Marland Kitchen COPD (chronic obstructive pulmonary disease) ([place])   . Diverticulosis   . Dysrhythmia    afib  . Eczema   . Environmental allergies    allergy shot- q friday in Dr. Janee Morn office. PFT's abnormal- recommended Spiriva to use preop & will d/c after surgery  . GERD (gastroesophageal reflux disease)   . Hiatal hernia   . Hyperlipidemia   . Hypertension   . Lung nodule 2011  . Paroxysmal atrial fibrillation ([place]) 11/27/2019  . Peripheral vascular disease ([place])   . Shingles   . Stenosis of popliteal artery (HCC)    blood clots in legs long ago    . Trigeminal neuralgia    Left buttocks   Past Surgical History:  Past Surgical History:  Procedure Laterality Date  . ABDOMINAL AORTAGRAM N/A 06/17/2011   Procedure: ABDOMINAL Maxcine Ham;  Surgeon: Elam Dutch, MD;  [facility] C[facility] LAB;  Service: Cardiovascular;  Laterality: N/A;  . ABDOMINAL AORTOGRAM W/LOWER EXTREMITY Bilateral 06/22/2018   Procedure: ABDOMINAL AORTOGRAM W/LOWER EXTREMITY;  Surgeon: Elam Dutch, MD;  Location: [place] CV LAB;  Service: Cardiovascular;  Laterality: Bilateral;  . ABDOMINAL AORTOGRAM W/LOWER EXTREMITY Bilateral 11/29/2019   Procedure: ABDOMINAL AORTOGRAM W/LOWER EXTREMITY;  Surgeon: Elam Dutch, MD;  Location: [place] CV LAB;  Service: Cardiovascular;  Laterality: Bilateral;  . CATARACT EXTRACTION, BILATERAL     w IOL  .  CHOLECYSTECTOMY  1998  . COLONOSCOPY    . ENDARTERECTOMY FEMORAL Left 12/09/2019   Procedure: ENDARTERECTOMY FEMORAL with bovine patch angioplasty.;  Surgeon: Elam Dutch, MD;  Location: [facility] OR;  Service: Vascular;  Laterality: Left;  . FEMORAL-POPLITEAL BYPASS GRAFT        x2 surgeries 1990's & 2009  . FEMORAL-POPLITEAL BYPASS GRAFT Right 12/09/2019   Procedure: REDO RIGHT FEMORAL-POPLITEAL ARTERY BYPASS GRAFT;  Surgeon: Elam Dutch, MD;  Location: [place];  Service: Vascular;  Laterality: Right;  . INJECTION KNEE Right Aug. 2016   Gel injection for pain  . LUMBAR FUSION  07/06/2011  . TONSILLECTOMY     as a teenager   . TUBAL LIGATION     Social History:  reports that she quit smoking about 26 years ago. Her smoking use included cigarettes. She has a 60.00 pack-year smoking history. She has never used smokeless tobacco. She reports that she does not drink alcohol and does not use drugs.  Family / Support Systems Marital Status: Widow/Widower Patient Roles: Parent, Other (Comment) (church member) Children: Scott-son [phone]  Cynthia-daughter [phone]-cell  Faye-daughter [phone]-cell Other Supports: Friends and church members Anticipated Caregiver: three daughter's and one son Ability/Limitations of Caregiver: Son is retired and daughter's are willing to [employer] first week to make sure transition is smooth Caregiver Availability: 24/7 (short time) Family Dynamics: Close knit family who are willing to assist Mom at DC. Pt has always been very independent and prides herself on this  Social History Preferred language: English Religion: [employer] Cultural Background: No issues Education: HS Read: Yes Write: Yes Employment Status: Retired Public relations account executive Issues: No issues Guardian/Conservator: None-according to MD pt is capable of making her own decisions while here   Abuse/Neglect Abuse/Neglect Assessment Can Be Completed: Yes Physical Abuse:  Denies Verbal Abuse: Denies Sexual Abuse: Denies Exploitation of patient/patient's resources: Denies Self-Neglect: Denies  Emotional Status Pt's affect, behavior and adjustment status: Pt is motivated to do well and regan her independence while here. She lives alone and wants to get back to this level. She feels she has become anxious here due to bed rails make her claustropic but she is woking on this and plans not to need O2 at DC Recent Psychosocial Issues: other health issues has remained independent Psychiatric History: Some anxiety since being hospitalized but is getting better on O2 due to SOB last night Substance Abuse History: NO issues  Patient / Family Perceptions, Expectations & Goals Pt/Family understanding of illness & functional limitations: Pt and son can explain her health issues and reason for hospitalization. She has made progress and feels she will be at the level she needs to be at DC. Both feel MD has answered their questions Premorbid pt/family roles/activities: Mom, grandmother, church member, friend Anticipated changes in roles/activities/participation: resume Pt/family expectations/goals: Pt states: " I want to do for myself like I was prior to hospital stay."  Son states:  "She will have plenty of help at home, we will all help her."  [employer]: None Premorbid Home Care/DME Agencies: Other (Comment) (cane in community) Transportation available at discharge: Self and family members  Discharge Planning Living Arrangements: Alone Support Systems: Children, Water engineer, Social worker community Type of Residence: Private residence Insurance Resources: [employer], Multimedia programmer (specify) Nurse, mental health) Financial Resources: [employer] [place] Referred: No Living Expenses: Own Money Management: Patient Does the patient have any problems obtaining your medications?: No Home Management: Self-family members will assist  now Patient/Family Preliminary Plans: Return home with son staying with for the first week. All four children will assist her and be available. Pt hopeful to be mod/i before she leaves here but is aware of the supervison level recommendation for DC Care Coordinator Anticipated Follow Up Needs: HH/OP  Clinical Impression Pleasant female who is motivated to do well and realizes she needs to work on her anxiety so she will not need O2 at home. Will work on discharge needs, son to stay with the first week home.  Elease Hashimoto 12/16/2019, 9:35 AM

## 2019-12-16 NOTE — IPOC Note (Signed)
Overall Plan of Care [facility]) Patient Details Name: KENISHA LYNDS MRN: [id number] DOB: Jan 08, 1925  Admitting Diagnosis: PAOD (peripheral arterial occlusive disease) [facility])  Hospital Problems: Principal Problem:   PAOD (peripheral arterial occlusive disease) ([place]) Active Problems:   PAD (peripheral artery disease) ([place])   Debility     Functional Problem List: Nursing Edema, Pain, Skin Integrity  PT Balance, Endurance, Pain, Safety  OT Balance, Edema, Motor, Pain, Sensory, Skin Integrity  SLP    TR         Basic ADL's: OT Bathing, Dressing, Toileting     Advanced  ADL's: OT       Transfers: PT    OT Toilet, Tub/Shower     Locomotion: PT Ambulation, Stairs     Additional Impairments: OT None  SLP        TR      Anticipated Outcomes Item Anticipated Outcome  Self Feeding n/a  Swallowing      Basic self-care  supervision  Toileting  mod I   Bathroom Transfers mod I  Bowel/Bladder  remain continent of bowel and bladder with min assist  Transfers  Supervision  Locomotion  Supervision  Communication     Cognition     Pain  less than 3  Safety/Judgment  No falls, skin breakdown, infection with min assist   Therapy Plan: PT Intensity: Minimum of 1-2 x/day ,45 to 90 minutes PT Frequency: 5 out of 7 days PT Duration Estimated Length of Stay: 7-10 days OT Intensity: Minimum of 1-2 x/day, 45 to 90 minutes OT Frequency: 5 out of 7 days OT Duration/Estimated Length of Stay: ~ 7 days     Due to the current state of emergency, patients may not be receiving their 3-hours of Medicare-mandated therapy.   Team Interventions: Nursing Interventions Patient/Family Education, Pain Management, Skin Care/Wound Management  PT interventions Ambulation/gait training, Community reintegration, DME/adaptive equipment instruction, Neuromuscular re-education, Psychosocial support, Stair training, UE/LE Strength taining/ROM, Training and development officer, Discharge  planning, Functional electrical stimulation, Pain management, Skin care/wound management, Therapeutic Activities, UE/LE Coordination activities, Cognitive remediation/compensation, Disease management/prevention, Functional mobility training, Patient/family education, Therapeutic Exercise  OT Interventions Training and development officer, DME/adaptive equipment instruction, Patient/family education, Therapeutic Activities, Cognitive remediation/compensation, Psychosocial support, Therapeutic Exercise, Community reintegration, Functional mobility training, Self Care/advanced ADL retraining, UE/LE Strength taining/ROM, Discharge planning, Neuromuscular re-education, Skin care/wound managment, UE/LE Coordination activities, Disease mangement/prevention, Pain management, Splinting/orthotics  SLP Interventions    TR Interventions    SW/CM Interventions Discharge Planning, Psychosocial Support, Patient/Family Education   Barriers to Discharge MD  Medical stability  Nursing      PT Home environment access/layout    OT      SLP      SW       Team Discharge Planning: Destination: PT-Home ,OT- Home , SLP-  Projected Follow-up: PT-Home health PT, OT-  Home health OT, SLP-  Projected Equipment Needs: PT-To be determined, OT- To be determined, SLP-  Equipment Details: PT- , OT-  Patient/family involved in discharge planning: PT- Patient, Family member/caregiver,  OT-Patient, SLP-   MD ELOS: 7-10 days S/modI Medical Rehab Prognosis:  Excellent Assessment: Mrs. Heidler is a 86 year old woman who was admitted to CIR with decreased functional mobility secondary to PVD status post redo right femoral to below-knee popliteal bypass as well as left common femoral enterectomy 12/09/2019. Her pain has been well controlled. Incision is being monitored daily. Electrolytes have been well controlled and are monitored weekly. CBCs have been monitored weekly given acute blood loss  anemia and have been stable. She reported  shortness of breath this morning and has received supplemental oxygen. She completed ancef and Flagyl for rectal bleeding. HR has been elevated and metoprolol was increased given her atrial fibrillation. Weights have been trending down nicely. CXR was obtained with shows atelectasis and small pleural effusions.   See Team Conference Notes for weekly updates to the plan of care

## 2019-12-16 NOTE — Progress Notes (Addendum)
Physical Therapy Session Note  Patient Details  Name: ISMELDA WEATHERMAN MRN: [id number] Date of Birth: 1925/04/06  Today's Date: 12/16/2019 PT Individual Time: 1000-1057 and 1345-1453 PT Individual Time Calculation (min): 57 min and 68 min  Short Term Goals: Week 1:  PT Short Term Goal 1 (Week 1): STGs=LTGs due to ELOS  Skilled Therapeutic Interventions/Progress Updates:   Treatment Session 1: 0932-6712 57 min Received pt supine in bed with pt's son present at bedside, pt agreeable to therapy, and denied any pain at start of session but reported bilateral foot pain with standing activities. Session with emphasis on functional mobility/transfers, generalized strengthening, dynamic standing balance/coordination, ambulation, and improved activity tolerance. Pt transferred supine<>sitting EOB with supervision with HOB elevated and use of bedrails. Pt reported SOB and lightheadedness. O2 sat 98% on RA. Pt's son stated that pt has been very anxious since coming to the hospital and that contributes a lot to her SOB. Pt transferred stand<>pivot bed<>WC without AD and min A with cues for transfer technique and hand placement for safety as pt initially trying to pull WC directly in front of her and pull up on WC armrests to stand. Pt transported to therapy gym in [facility] total A for energy conservation purposes and ambulated 142ft with RW CGA. Pt demonstrated narrow BOS and decreased stride length. O2 sat 97% after ambulating and pt reported minor SOB that improved in 2 minutes. Pt transferred sit<>stand min A and performed alternating toe taps to 3in step with mod handheld assist 2x10. Pt performed the following exercises standing with bilateral UE support on RW and CGA with verbal/visual cues for technique: -mini squats 2x8 -heel raises x2 reps (stopped due to increased pain in bilateral toes) -alternating marches 2x12 Pt transferred sit<>stand without AD and min A and worked on dynamic standing balance, standing  tolerance, and problem solving constructing picture with pipes. Pt required min cues to select appropriate pieces to construct picture. Pt able to remain standing for 7 minutes with close supervision. Pt transferred mat<>WC stand<>pivot without AD and min A and transported back to room in [facility] total A. Pt ambulated additional 60ft with RW CGA back to bed and transferred sit<>supine with supervision. Concluded session with pt supine in bed, needs within reach, and bed alarm on. Pt's son present at bedside.   Treatment Session 2: 4580-9983 38 min  Received pt supine in bed, pt agreeable to therapy, and reported bilateral foot pain 5/10. RN notified and present to administer medication during session. Repositioning, rest breaks, and distraction done to reduce pain. Also discussed with nursing potential for getting anxiety medication from MD as pt frequently shaking and very anxious overall about experience in hospital. Session with emphasis on functional mobility/transfers, generalized strengthening, dynamic standing balance/coordination, ambulation and community navigation, and improved activity tolerance. Pt reported increased discomfort and swelling in abdomen and stated her pants felt too tight. Therapist provided pt with scrub pants and pt transferred supine<>sit with supervision and sit<>stand with RW min A and doffed pants and donned scrub pants mod A. Stand<>pivot bed<>WC without AD and min A with cues for hand placement on WC armrests. Pt transported outside in [facility] total A. Worked on deep breathing techniques and mental imagery to reduce anxiety with verbal cues to focus on the sounds of the water fountain; pt appeared more relaxed afterwards. Pt ambulated 57ft with RW and CGA over uneven concrete surfaces outside with cues for upright posture. Pt transported to dayroom in [facility] total A and stand<>pivot WC<>Nustep without  AD and min A with cues for hand placement and turning technique. Pt performed bilateral  UE/LE strengthening on Nustep at workload 3 for a total of 5 minutes with 3 rest breaks in between for a total of 239 steps for improved cardiovascular endurance. Pt reported 8/10 fatigue after activity and required multiple rest/water breaks throughout session. Pt transported back to room in [facility] total A and stand<>pivot WC<>bed min A and sit<>supine with supervision. Concluded session with pt supine in bed, needs within reach, and bed alarm on.   Therapy Documentation Precautions:  Precautions Precautions: Fall Precaution Comments: watch HR  Restrictions Weight Bearing Restrictions: No   Therapy/Group: Individual Therapy Alfonse Alpers PT, DPT   12/16/2019, 7:28 AM

## 2019-12-16 NOTE — Progress Notes (Signed)
  Progress Note    12/16/2019 8:47 AM POD 7  Subjective:  OOB to [facility] working with therapist.  A bit SOB, O2 sat low 90s on RA   Vitals:   12/16/19 0338 12/16/19 0758  BP: (!) 136/98 124/78  Pulse: 89   Resp: 19   Temp: 98.4 F (36.9 C)   SpO2: 94%     Physical Exam: Cardiac:  RRR Lungs:  Slight pursed lip breathing. Able to speak in full sentences Incisions:  Bil groin and right lower leg incisions healing without signs of infection Extremities:  1+ bil foot and ankle edema.  Righ foot warm and well perfused. Sensation and motor function intact  CBC    Component Value Date/Time   WBC 12.2 (H) 12/16/2019 0458   RBC 3.26 (L) 12/16/2019 0458   HGB 9.6 (L) 12/16/2019 0458   HCT 29.7 (L) 12/16/2019 0458   PLT 266 12/16/2019 0458   MCV 91.1 12/16/2019 0458   MCH 29.4 12/16/2019 0458   MCHC 32.3 12/16/2019 0458   RDW 14.7 12/16/2019 0458   LYMPHSABS 1.4 12/16/2019 0458   MONOABS 1.5 (H) 12/16/2019 0458   EOSABS 0.2 12/16/2019 0458   BASOSABS 0.1 12/16/2019 0458    BMET    Component Value Date/Time   NA 140 12/16/2019 0458   NA 141 11/27/2019 1026   K 3.6 12/16/2019 0458   CL 104 12/16/2019 0458   CO2 23 12/16/2019 0458   GLUCOSE 118 (H) 12/16/2019 0458   BUN 7 (L) 12/16/2019 0458   BUN 26 11/27/2019 1026   CREATININE 0.91 12/16/2019 0458   CREATININE 1.17 (H) 05/08/2019 1402   CALCIUM 8.1 (L) 12/16/2019 0458   GFRNONAA 54 (L) 12/16/2019 0458   GFRAA >60 12/16/2019 0458     Intake/Output Summary (Last 24 hours) at 12/16/2019 0847 Last data filed at 12/15/2019 1700 Gross per 24 hour  Intake 240 ml  Output --  Net 240 ml    HOSPITAL MEDICATIONS Scheduled Meds: . apixaban  5 mg Oral BID  . aspirin EC  81 mg Oral QPM  . atorvastatin  40 mg Oral q1800  . lamoTRIgine  100 mg Oral BID  . metoprolol succinate  75 mg Oral Daily  . pantoprazole  40 mg Oral BID  . saccharomyces boulardii  250 mg Oral BID   Continuous Infusions: PRN Meds:.acetaminophen  **OR** acetaminophen, loperamide, oxyCODONE-acetaminophen, prochlorperazine  Assessment: s/p re-do right fem to BK pop bypass and left fem endarterectomy. LEs well perfused.     Plan: -Keep dry gauze tucked into both groins at all times.    Risa Grill, PA-C [facility] [phone] 12/16/2019  8:47 AM

## 2019-12-16 NOTE — Progress Notes (Signed)
[facility] [place]  Subjective  - some wheezing this morning   Objective 126/74 (!) 107 98.4 F (36.9 C) 19 96%  Intake/Output Summary (Last 24 hours) at 12/16/2019 1613 Last data filed at 12/16/2019 1300 Gross per 24 hour  Intake 420 ml  Output --  Net 420 ml   Groin and leg incisions healing Feet warm still edema in legs  Assessment/Planning: Slowly improving s/p right fem pop and left femoral endarterectomy Still with a fair amount of peripheral edema will give another dose of lasix today Will shoot for 1-2 kg or a liter negative each day for the next few days  [name] 12/16/2019 4:13 PM --  Laboratory Lab Results: Recent Labs    12/16/19 0458  WBC 12.2*  HGB 9.6*  HCT 29.7*  PLT 266   BMET Recent Labs    12/16/19 0458  NA 140  K 3.6  CL 104  CO2 23  GLUCOSE 118*  BUN 7*  CREATININE 0.91  CALCIUM 8.1*    COAG Lab Results  Component Value Date   INR 1.0 12/09/2019   INR 1.2 11/29/2019   INR 1.8 (A) 11/21/2019   No results found for: PTT

## 2019-12-16 NOTE — Progress Notes (Addendum)
[name] [name] PHYSICAL MEDICINE & REHABILITATION PROGRESS NOTE   Subjective/Complaints: Reports urinating every 15 minutes with burning- UA/UC ordered today. Requiring O2 this morning for SOB while ambulating to the bathroom. She also had some wheezing. She is now on 99% O2. She does have some claustrophobia and feels her SOB at night may be from anxiety.   ROS: Patient denies fever, rash, sore throat, blurred vision, nausea, vomiting, diarrhea, cough, shortness of breath or chest pain,  headache, or mood change.   Objective:   DG Chest 2 View  Result Date: 12/14/2019 CLINICAL DATA:  Shortness of breath EXAM: CHEST - 2 VIEW COMPARISON:  05/28/2018 FINDINGS: Frontal and lateral views of the chest demonstrate an unremarkable cardiac silhouette given AP positioning. There is blunting of the costophrenic angles posteriorly, consistent with bilateral lower lobe consolidation and small effusions. Favor atelectasis. No pneumothorax. No acute bony abnormalities. IMPRESSION: 1. Blunting of the posterior costophrenic angles consistent with bilateral lower lobe atelectasis and/or small effusions. Electronically Signed   By: Randa Ngo M.D.   On: 12/14/2019 19:13   Recent Labs    12/16/19 0458  WBC 12.2*  HGB 9.6*  HCT 29.7*  PLT 266   Recent Labs    12/16/19 0458  NA 140  K 3.6  CL 104  CO2 23  GLUCOSE 118*  BUN 7*  CREATININE 0.91  CALCIUM 8.1*    Intake/Output Summary (Last 24 hours) at 12/16/2019 0902 Last data filed at 12/15/2019 1700 Gross per 24 hour  Intake 240 ml  Output --  Net 240 ml     Physical Exam: Vital Signs Blood pressure 124/78, pulse 89, temperature 98.4 F (36.9 C), resp. rate 19, height 4\' 11"  (1.499 m), weight 81.3 kg, SpO2 94 %. General: Alert and oriented x 3, No apparent distress HEENT: Head is normocephalic, atraumatic, PERRLA, EOMI, sclera anicteric, oral mucosa pink and moist, dentition intact, ext ear canals clear,  Neck: Supple without JVD or  lymphadenopathy Heart: Reg rate and rhythm. No murmurs rubs or gallops Chest: CTA bilaterally without wheezes, rales, or rhonchi; no distress Abdomen: Soft, non-tender, non-distended, bowel sounds positive. Extremities: No clubbing, cyanosis, or edema. Pulses are 2+ Skin: RLE incision CDI. Some irritation around right inguinal incision, moist Neuro: Pt is cognitively appropriate with normal insight, memory, and awareness. Cranial nerves 2-12 are intact. Sensory exam is normal. Reflexes are 2+ in all 4's. Fine motor coordination is intact. No tremors. Motor function is grossly 5/5 UE. RLE limited by pain 3-4/5. LLE 4/5.  Musculoskeletal: Full ROM, No pain with AROM or PROM in the neck, trunk, or extremities. Posture appropriate Psych: Pt's affect is appropriate. Pt is cooperative   Assessment/Plan: 1. Functional deficits secondary to PAD s/p right FPBG which require 3+ hours per day of interdisciplinary therapy in a comprehensive inpatient rehab setting.  Physiatrist is providing close team supervision and 24 hour management of active medical problems listed below.  Physiatrist and rehab team continue to assess barriers to discharge/monitor patient progress toward functional and medical goals  Care Tool:  Bathing    Body parts bathed by patient: Right arm, Left arm, Chest, Abdomen, Front perineal area, Buttocks, Right upper leg, Left upper leg, Face   Body parts bathed by helper: Right lower leg, Left lower leg     Bathing assist Assist Level: Minimal Assistance - Patient > 75%     Upper Body Dressing/Undressing Upper body dressing Upper body dressing/undressing activity did not occur (including orthotics): N/A What is the patient  wearing?: Pull over shirt    Upper body assist Assist Level: Contact Guard/Touching assist (due to IV)    Lower Body Dressing/Undressing Lower body dressing      What is the patient wearing?: Incontinence brief     Lower body assist Assist for  lower body dressing: Maximal Assistance - Patient 25 - 49%     Toileting Toileting    Toileting assist Assist for toileting: Minimal Assistance - Patient > 75%     Transfers Chair/bed transfer  Transfers assist     Chair/bed transfer assist level: Moderate Assistance - Patient 50 - 74%     Locomotion Ambulation   Ambulation assist      Assist level: Minimal Assistance - Patient > 75% Assistive device: Walker-rolling Max distance: 50'   Walk 10 feet activity   Assist     Assist level: Minimal Assistance - Patient > 75% Assistive device: Walker-rolling   Walk 50 feet activity   Assist    Assist level: Minimal Assistance - Patient > 75% Assistive device: Walker-rolling    Walk 150 feet activity   Assist Walk 150 feet activity did not occur: Safety/medical concerns         Walk 10 feet on uneven surface  activity   Assist Walk 10 feet on uneven surfaces activity did not occur: Safety/medical concerns         Wheelchair     Assist Will patient use wheelchair at discharge?: No             Wheelchair 50 feet with 2 turns activity    Assist            Wheelchair 150 feet activity     Assist          Blood pressure 124/78, pulse 89, temperature 98.4 F (36.9 C), resp. rate 19, height 4\' 11"  (1.499 m), weight 81.3 kg, SpO2 94 %.  Medical Problem List and Plan: 1.  Decreased functional mobility secondary to PVD status post redo right femoral to below-knee popliteal bypass as well as left common femoral enterectomy 12/09/2019.  Follow-up vascular surgery Dr. Oneida Alar             -patient may shower covering right lower extremity             -ELOS/Goals: 7-10 days/supervision/mod I             -Continue CIR therapies including PT, OT  2.  Antithrombotics: -DVT/anticoagulation: Eliquis             -antiplatelet therapy: Aspirin 81 mg daily 3.  Failed back syndrome/pain Management:  Oxycodone as needed. Last required 8/22              Monitor with increased exertion 4. Mood: Provide emotional support             -antipsychotic agents: N/A 5. Neuropsych: This patient is capable of making decisions on her own behalf. 6. Skin/Wound Care: Routine skin checks  -place wash rag in right groin to help with skin contact 7. Fluids/Electrolytes/Nutrition: Routine in and outs.   -Na 140 on 8/23.  8.  Acute blood loss anemia.  Hgb improved to 9.6 on 8/23.  9.  Rectal bleeding .  Follow-up GI.  CT of abdomen consistent with ischemic colitis.             CBC ordered- hgb 8.5 (increased), wbc's down             Ancef completed. Last dose  flagyl this morning 8/22  -recheck labs monday 10.  Atrial fibrillation RVR.  Follow-up cardiology services.. Continue Toprol-XL 50 mg daily             8/21-22 HR in 100's. Monitor for pattern, activity tolerance   -increase metoprolol to 75mg  daily  8/22 pt reports hx of fluid overload. Feels that she was getting overloaded from fluids with abx    Filed Weights   12/13/19 1840 12/15/19 0328 12/16/19 0500  Weight: 84.5 kg 82.7 kg 81.3 kg     -cxr reviewed from 8/21 which demonstrates only LL atelectasis and minimal effusions at corners of lungs   -encouraged pt to use IS, OOB, etc  8/23: Ordered new IS as current one is broken. Explained results of XR to her and encouraged use of IS 11.  GERD.  Protonix 12.  COPD.  Check oxygen saturations every shift 13.  Hyperlipidemia. Lipitor 14.  Trigeminal neuralgia.  Maintained on Lamictal 100 mg twice daily per neurology services 15. Loose stool: d/t abx  -abx completed today  -add probiotic, prn imodium 16. Hypocalcemia: supplement started 8/23 (she is on combined calcium and vitamin D supplement at home). Repeat level Monday.   >35 minutes spent in discussion with patient, husband, and therapy regarding patient's shortness of breath, wheezing, CXR results, weight, hypocalcemia, anemia, leukocytosis, ordering UA/UC, discussing home support,  reviewing vitals and today's labs    LOS: 3 days A FACE TO FACE EVALUATION WAS PERFORMED  [name] [name] 12/16/2019, 9:02 AM

## 2019-12-16 NOTE — Plan of Care (Signed)
  Problem: Consults Goal: RH GENERAL PATIENT EDUCATION Description: See Patient Education module for education specifics. Outcome: Progressing   Problem: RH SKIN INTEGRITY Goal: RH STG SKIN FREE OF INFECTION/BREAKDOWN Description: Free of breakdown and infection while on rehab with min assist Outcome: Progressing Goal: RH STG ABLE TO PERFORM INCISION/WOUND CARE W/ASSISTANCE Description: STG Able To Perform Incision/Wound Care With Assistance. Min Outcome: Progressing   Problem: RH PAIN MANAGEMENT Goal: RH STG PAIN MANAGED AT OR BELOW PT'S PAIN GOAL Description: Less than 3 Outcome: Progressing

## 2019-12-16 NOTE — Progress Notes (Signed)
[facility] Individual Statement of Services  Patient Name:  [name] [name]  Date:  12/16/2019  Welcome to the [facility].  Our goal is to provide you with an individualized program based on your diagnosis and situation, designed to meet your specific needs.  With this comprehensive rehabilitation program, you will be expected to participate in at least 3 hours of rehabilitation therapies Monday-Friday, with modified therapy programming on the weekends.  Your rehabilitation program will include the following services:  Physical Therapy (PT), Occupational Therapy (OT), 24 hour per day rehabilitation nursing, Care Coordinator, Rehabilitation Medicine, Nutrition Services and Pharmacy Services  Weekly team conferences will be held on Wednesday to discuss your progress.  Your Inpatient Rehabilitation Care Coordinator will talk with you frequently to get your input and to update you on team discussions.  Team conferences with you and your family in attendance may also be held.  Expected length of stay: 7 days  Overall anticipated outcome: supervision level  Depending on your progress and recovery, your program may change. Your Inpatient Rehabilitation Care Coordinator will coordinate services and will keep you informed of any changes. Your Inpatient Rehabilitation Care Coordinator's name and contact numbers are listed  below.  The following services may also be recommended but are not provided by the [facility] will be made to provide these services after discharge if needed.  Arrangements include referral to agencies that provide these services.  Your insurance has been verified to be:  [place] Your primary doctor is:  [name]  Pertinent information will be shared with your doctor and your insurance  company.  Inpatient Rehabilitation Care Coordinator:  Ovidio Kin, [place]  Information discussed with and copy given to patient by: Elease Hashimoto, 12/16/2019, 9:19 AM

## 2019-12-16 NOTE — Progress Notes (Signed)
Pt C/O SOB no difficulty of breathing noted SpO2  97% R/A.  O2 L placed on pt  Dr. Naaman Plummer  notified. Pt denies chest pain and blurred vision at time of assessment. Staff will continue to monitor.

## 2019-12-16 NOTE — Progress Notes (Signed)
Inpatient Rehabilitation  Patient information reviewed and entered into eRehab system by Wayburn Shaler M. Jamale Spangler, M.A., CCC/SLP, PPS Coordinator.  Information including medical coding, functional ability and quality indicators will be reviewed and updated through discharge.    

## 2019-12-16 NOTE — Progress Notes (Signed)
Occupational Therapy Session Note  Patient Details  Name: [name] [name] MRN: [id number] Date of Birth: Sep 03, 1924  Today's Date: 12/16/2019 OT Individual Time: 0800-0900 OT Individual Time Calculation (min): 60 min    Short Term Goals: Week 1:  OT Short Term Goal 1 (Week 1): STG=LTG  Skilled Therapeutic Interventions/Progress Updates:    patient in bed, alert - she was initially reluctant to get up for therapy but agreed to sit up for nursing medication  and continued to tolerate activity as session progressed.  She denies pain at this time.  On 2L of O2 with 99% saturation.  Trail during toileting without O2 - she maintained saturation > 95% but HR at 112 and she felt SOB - returned to 1.5 L O2 with relief - MD aware.  Rolling right in bed with CS, side lying to sitting edge of bed with min A and cues for repositioning.  She tolerated unsupported sitting for 10 minutes with eating some of her breakfast - she requires min A to set up.  Sit to stand and SPT to/from bed, w/c and toilet with CGA to steady.   toileting completed with min A for clothing management, mod A for hygiene.   Completed bathing w/c level - set up for UB bathing, max A to wash buttocks in stance.  Max A to donn incontinence brief, mod A to donn pants, dependent for teds and slipper socks.  OH shirt min A.  Grooming tasks w/c level with set up.  Reviewed positioning, posture and importance of upright sitting for eating.  Son present for session.  Completed standing activity with CGA.  She returned to w/c at close of session, seat belt alarm set and callbell in reach.    Therapy Documentation Precautions:  Precautions Precautions: Fall Precaution Comments: watch HR  Restrictions Weight Bearing Restrictions: No   Therapy/Group: Individual Therapy  Carlos Levering 12/16/2019, 7:33 AM

## 2019-12-17 ENCOUNTER — Inpatient Hospital Stay (HOSPITAL_COMMUNITY): Payer: Medicare Other | Admitting: Occupational Therapy

## 2019-12-17 ENCOUNTER — Inpatient Hospital Stay (HOSPITAL_COMMUNITY): Payer: Medicare Other

## 2019-12-17 ENCOUNTER — Ambulatory Visit: Payer: Medicare Other | Admitting: Allergy and Immunology

## 2019-12-17 ENCOUNTER — Ambulatory Visit: Payer: Self-pay | Admitting: General Practice

## 2019-12-17 LAB — CBC WITH DIFFERENTIAL/PLATELET
Abs Immature Granulocytes: 1.37 10*3/uL — ABNORMAL HIGH (ref 0.00–0.07)
Basophils Absolute: 0 10*3/uL (ref 0.0–0.1)
Basophils Relative: 0 %
Eosinophils Absolute: 0.2 10*3/uL (ref 0.0–0.5)
Eosinophils Relative: 1 %
HCT: 29.1 % — ABNORMAL LOW (ref 36.0–46.0)
Hemoglobin: 9.3 g/dL — ABNORMAL LOW (ref 12.0–15.0)
Immature Granulocytes: 10 %
Lymphocytes Relative: 10 %
Lymphs Abs: 1.4 10*3/uL (ref 0.7–4.0)
MCH: 29.5 pg (ref 26.0–34.0)
MCHC: 32 g/dL (ref 30.0–36.0)
MCV: 92.4 fL (ref 80.0–100.0)
Monocytes Absolute: 1.3 10*3/uL — ABNORMAL HIGH (ref 0.1–1.0)
Monocytes Relative: 9 %
Neutro Abs: 10 10*3/uL — ABNORMAL HIGH (ref 1.7–7.7)
Neutrophils Relative %: 70 %
Platelets: 300 10*3/uL (ref 150–400)
RBC: 3.15 MIL/uL — ABNORMAL LOW (ref 3.87–5.11)
RDW: 14.9 % (ref 11.5–15.5)
WBC: 14.4 10*3/uL — ABNORMAL HIGH (ref 4.0–10.5)
nRBC: 0.3 % — ABNORMAL HIGH (ref 0.0–0.2)

## 2019-12-17 LAB — URINALYSIS, ROUTINE W REFLEX MICROSCOPIC
Bilirubin Urine: NEGATIVE
Glucose, UA: NEGATIVE mg/dL
Ketones, ur: NEGATIVE mg/dL
Nitrite: NEGATIVE
Protein, ur: NEGATIVE mg/dL
Specific Gravity, Urine: 1.005 (ref 1.005–1.030)
pH: 6 (ref 5.0–8.0)

## 2019-12-17 MED ORDER — GUAIFENESIN 100 MG/5ML PO SOLN: 5.0000 mL | Freq: Four times a day (QID) | ORAL | Status: DC | PRN

## 2019-12-17 MED ORDER — POTASSIUM CHLORIDE CRYS ER 20 MEQ PO TBCR
40.0000 meq | EXTENDED_RELEASE_TABLET | Freq: Once | ORAL | Status: AC
  Administered 2019-12-17: 40 meq via ORAL
  Filled 2019-12-17: qty 2

## 2019-12-17 MED ORDER — FUROSEMIDE 10 MG/ML IJ SOLN
40.0000 mg | Freq: Once | INTRAMUSCULAR | Status: AC
  Administered 2019-12-17: 40 mg via INTRAVENOUS
  Filled 2019-12-17: qty 4

## 2019-12-17 NOTE — Progress Notes (Signed)
Physical Therapy Session Note  Patient Details  Name: [name] [name] MRN: [id number] Date of Birth: [birth year]  Today's Date: 12/17/2019 PT Individual Time: [phone] and [phone]  PT Individual Time Calculation (min): 54 min and 71 min  Short Term Goals: Week 1:  PT Short Term Goal 1 (Week 1): STGs=LTGs due to ELOS  Skilled Therapeutic Interventions/Progress Updates:   Treatment Session 1: [id number] 54 min Received pt sitting EOB with RN administering medications. Pt reporting that she believes she is getting too much medication and it's causing her to feel anxious, shaky, dizzy, and delusional. Pt reported looking at her dinner tray last night thinking it was on fire and smoking. Pt agreeable to therapy, and denied any pain during session. Pt reported feeling dizzy sitting EOB despite RN recently taking vitals (results WFL) and transferred sit<>supine with supervision to rest eyes ~5 minutes. While lying pt stated "I don't know what's wrong I'm not usually like this" but reported feeling better after lying for a few minutes. Pt transferred supine<>sitting EOB with supervision. O2 sat 97% and HR 92bpm. Pt transferred sit<>stand with RW x 2 trials with CGA and cues for hand placement and required mod A to don pants. Stand<>pivot bed<>WC with RW and CGA. Pt sat in WC at sink and brushed teeth, washed face, combed hair, and shaved chin with set up assist. Doffed dirty shirt and donned clean one with set up assist. Pt performed WC mobility 1ft using bilateral UEs and supervision to therapy gym for improved cardiovascular endurance but stopped due to reports of nausea. MD present for morning rounds and discussed medication management, tremors and dizziness, and frequent bowel movements. Pt ambulated 11ft with RW and CGA with cues for upright posture and increased step length. Pt transported back to room in [facility] total A and transferred WC<>bed stand<>pivot with RW and CGA and sit<>supine with supervision.  Concluded session with pt supine in bed, needs within reach, and bed alarm on.  Treatment Session 2: [id number] 71 min Received pt supine in bed with pt's daughter present at bedside, pt agreeable to therapy, and denied any pain during session. Session with emphasis on functional mobility/transfers, generalized strengthening, dynamic standing balance/coordination, ambulation, and improved activity tolerance. Pt performed bed mobility with supervision and use of bedrails with HOB elevated. Pt transferred stand<>pivot without AD and min A with cues for hand placement on WC armrests. Noted abrasion on L wrist and applied bandage. Pt transported to dayroom in [facility] total A amd ambulated 165ft with RW and CGA. O2 sat 97% and HR 110bpm. Pt ambulated in/out of 5 cones (~96ft) with min handheld assist with 2 LOB to L requiring min/mod A to correct. Pt required multiple extended rest brakes throughout session due to fatigue. Pt performed lateral side stepping at rail with bilateral UE support and CGA for 38ft. O2 sat 92% and HR 121bpm improving to 97% and HR 110bpm with 3 minute seated rest break. Pt with questions regarding team conference and therapist educated on purpose of team conference and D/C planning; pt stated she would like to stay longer to be as independent as possible upon D/C. Pt transported to rehab apartment in [facility] total A and performed furniture transfer onto recliner to simulate home environment with RW and min A with cues for anterior weight shifting, foot positioning, and hand placement when standing. Pt transported back to room in [facility] total A and ambulated 38ft with min handheld assist back to bed and performed bed mobility with supervision. Concluded session  with pt supine in bed, needs within reach, and bed alarm on.   Therapy Documentation Precautions:  Precautions Precautions: Fall Precaution Comments: watch HR  Restrictions Weight Bearing Restrictions: No  Therapy/Group: Individual  Therapy Alfonse Alpers PT, DPT   12/17/2019, 7:21 AM

## 2019-12-17 NOTE — Plan of Care (Signed)
  Problem: Consults Goal: RH GENERAL PATIENT EDUCATION Description: See Patient Education module for education specifics. Outcome: Progressing   Problem: RH SKIN INTEGRITY Goal: RH STG SKIN FREE OF INFECTION/BREAKDOWN Description: Free of breakdown and infection while on rehab with min assist Outcome: Progressing Goal: RH STG ABLE TO PERFORM INCISION/WOUND CARE W/ASSISTANCE Description: STG Able To Perform Incision/Wound Care With Assistance. Min Outcome: Progressing   Problem: RH PAIN MANAGEMENT Goal: RH STG PAIN MANAGED AT OR BELOW PT'S PAIN GOAL Description: Less than 3 Outcome: Progressing

## 2019-12-17 NOTE — Progress Notes (Signed)
Occupational Therapy Session Note  Patient Details  Name: [name] [name] MRN: [id number] Date of Birth: [birth year]  Today's Date: 12/17/2019 OT Individual Time: 0973-5329 OT Individual Time Calculation (min): 61 min    Short Term Goals: Week 1:  OT Short Term Goal 1 (Week 1): STG=LTG  Skilled Therapeutic Interventions/Progress Updates:    Treatment session with focus on LB dressing, sit > stand, and dynamic standing balance.  Pt received seated EOB with daughter present.  Pt agreeable to focusing on LB dressing with donning new pants that daughter brought.  Therapist educated pt on use of reacher to assist with donning pants, however still required max multimodal cues and physical assistance when threading pants with use of reacher.  Pt completed sit > stand from EOB with CGA, did require assistance to pull pants over hips as well.  Pt will benefit from continued focus on problem solving best technique for LB dressing.  Engaged in standing activity at high-low table with focus on increased activity tolerance/endurance.  Pt reports intermittent shakiness in whole body, when this happens she reports shallow breathing.  O2 sats >95% throughout session on room air and HR 90s at rest.  HR would increase to 112-118 with standing activity.  Pt completed sit > stand with CGA with good carryover of hand placement.  Engaged in table top activity with pt standing 7 and then 5 mins with supervision while completing table top task.  Returned to room and ambulated 10' with RW back to bed.  Pt remained seated EOB with daughter present, bed alarm on, and all needs in reach.  Therapy Documentation Precautions:  Precautions Precautions: Fall Precaution Comments: watch HR  Restrictions Weight Bearing Restrictions: No Pain:  Pt with no c/o pain   Therapy/Group: Individual Therapy  Simonne Come 12/17/2019, 12:17 PM

## 2019-12-17 NOTE — Progress Notes (Addendum)
PHYSICAL MEDICINE & REHABILITATION PROGRESS NOTE   Subjective/Complaints: [name] is concerned she is on too many medications. She had tremor in her hands this morning and feels her medications may be contributing. Last night I started 0.25mg  klonopin for anxiety and she felt this helped. Advised that we can make her Klonopin and Robitussin as needed and she is agreeable to this.   ROS: Patient denies fever, rash, sore throat, blurred vision, nausea, vomiting, diarrhea, cough, shortness of breath or chest pain,  headache, or mood change.   Objective:   No results found. Recent Labs    12/16/19 0458  WBC 12.2*  HGB 9.6*  HCT 29.7*  PLT 266   Recent Labs    12/16/19 0458  NA 140  K 3.6  CL 104  CO2 23  GLUCOSE 118*  BUN 7*  CREATININE 0.91  CALCIUM 8.1*    Intake/Output Summary (Last 24 hours) at 12/17/2019 0954 Last data filed at 12/17/2019 0730 Gross per 24 hour  Intake 540 ml  Output --  Net 540 ml     Physical Exam: Vital Signs Blood pressure 123/67, pulse 100, temperature 98.4 F (36.9 C), resp. rate (!) 22, height 4\' 11"  (1.499 m), weight 81.3 kg, SpO2 95 %. General: Alert and oriented x 3, No apparent distress HEENT: Head is normocephalic, atraumatic, PERRLA, EOMI, sclera anicteric, oral mucosa pink and moist, dentition intact, ext ear canals clear,  Neck: Supple without JVD or lymphadenopathy Heart: Reg rate and rhythm. No murmurs rubs or gallops Chest: CTA bilaterally without wheezes, rales, or rhonchi; no distress Abdomen: Soft, non-tender, non-distended, bowel sounds positive. Extremities: No clubbing, cyanosis, or edema. Pulses are 2+ Skin: RLE incision CDI. Some irritation around right inguinal incision, moist Neuro: Pt is cognitively appropriate with normal insight, memory, and awareness. Cranial nerves 2-12 are intact. Sensory exam is normal. Reflexes are 2+ in all 4's. Fine motor coordination is intact. No tremors. Motor function is  grossly 5/5 UE. RLE limited by pain 3-4/5. LLE 4/5.  Musculoskeletal: Full ROM, No pain with AROM or PROM in the neck, trunk, or extremities. Posture appropriate Psych: Pt's affect is appropriate. Pt is cooperative   Assessment/Plan: 1. Functional deficits secondary to PAD s/p right FPBG which require 3+ hours per day of interdisciplinary therapy in a comprehensive inpatient rehab setting.  Physiatrist is providing close team supervision and 24 hour management of active medical problems listed below.  Physiatrist and rehab team continue to assess barriers to discharge/monitor patient progress toward functional and medical goals  Care Tool:  Bathing    Body parts bathed by patient: Right arm, Left arm, Chest, Abdomen, Front perineal area, Buttocks, Right upper leg, Left upper leg, Face   Body parts bathed by helper: Right lower leg, Left lower leg     Bathing assist Assist Level: Minimal Assistance - Patient > 75%     Upper Body Dressing/Undressing Upper body dressing Upper body dressing/undressing activity did not occur (including orthotics): N/A What is the patient wearing?: Pull over shirt    Upper body assist Assist Level: Contact Guard/Touching assist (due to IV)    Lower Body Dressing/Undressing Lower body dressing      What is the patient wearing?: Incontinence brief     Lower body assist Assist for lower body dressing: Maximal Assistance - Patient 25 - 49%     Toileting Toileting    Toileting assist Assist for toileting: Moderate Assistance - Patient 50 - 74%     Transfers  Chair/bed transfer  Transfers assist     Chair/bed transfer assist level: Contact Guard/Touching assist     Locomotion Ambulation   Ambulation assist      Assist level: Contact Guard/Touching assist Assistive device: Walker-rolling Max distance: 49ft   Walk 10 feet activity   Assist     Assist level: Contact Guard/Touching assist Assistive device: Walker-rolling    Walk 50 feet activity   Assist    Assist level: Contact Guard/Touching assist Assistive device: Walker-rolling    Walk 150 feet activity   Assist Walk 150 feet activity did not occur: Safety/medical concerns         Walk 10 feet on uneven surface  activity   Assist Walk 10 feet on uneven surfaces activity did not occur: Safety/medical concerns         Wheelchair     Assist Will patient use wheelchair at discharge?: No Type of Wheelchair: Manual    Wheelchair assist level: Supervision/Verbal cueing Max wheelchair distance: 107ft    Wheelchair 50 feet with 2 turns activity    Assist            Wheelchair 150 feet activity     Assist          Blood pressure 123/67, pulse 100, temperature 98.4 F (36.9 C), resp. rate (!) 22, height 4\' 11"  (1.499 m), weight 81.3 kg, SpO2 95 %.  Medical Problem List and Plan: 1.  Decreased functional mobility secondary to PVD status post redo right femoral to below-knee popliteal bypass as well as left common femoral enterectomy 12/09/2019.  Follow-up vascular surgery Dr. Oneida Alar             -patient may shower covering right lower extremity             -ELOS/Goals: 7-10 days/supervision/mod I             -Continue CIR therapies including PT, OT  2.  Antithrombotics: -DVT/anticoagulation: Eliquis             -antiplatelet therapy: Aspirin 81 mg daily 3.  Failed back syndrome/pain Management:  Oxycodone as needed. Required one dose 8/23.              Monitor with increased exertion 4. Mood: Provide emotional support             -antipsychotic agents: N/A  8/24: Nurse contacted me last night to ask for medication for anxiety for patient. Patient had mentioned to me yesterday that she felt claustrophobic at night and felt this contributed to her shortness of breath in the morning. She did note benefit from Klonopin at night but felt tremor in the morning and feels she is on too many medications. Discussed changing  Klonopin and Robitussin to PRN. Discussed with patient and placed nursing order for lavender drops on forehead at night and chamomile tea with dinner.  5. Neuropsych: This patient is capable of making decisions on her own behalf. 6. Skin/Wound Care: Routine skin checks  -place wash rag in right groin to help with skin contact 7. Fluids/Electrolytes/Nutrition: Routine in and outs.   -Na 140 on 8/23.  8.  Acute blood loss anemia.  Hgb improved to 9.6 on 8/23.  9.  Rectal bleeding .  Follow-up GI.  CT of abdomen consistent with ischemic colitis.             CBC ordered- hgb 8.5 (increased), wbc's down             Ancef completed.  Last dose flagyl on 8/22  -recheck labs monday 10.  Atrial fibrillation RVR.  Follow-up cardiology services.. Continue Toprol-XL 50 mg daily             8/21-22 HR in 100's. Monitor for pattern, activity tolerance   -increase metoprolol to 75mg  daily  8/22 pt reports hx of fluid overload. Feels that she was getting overloaded from fluids with abx    Filed Weights   12/13/19 1840 12/15/19 0328 12/16/19 0500  Weight: 84.5 kg 82.7 kg 81.3 kg     -cxr reviewed from 8/21 which demonstrates only LL atelectasis and minimal effusions at corners of lungs   -encouraged pt to use IS, OOB, etc  8/23: Ordered new IS as current one is broken. Explained results of XR to her and encouraged use of IS 11.  GERD.  Protonix 12.  COPD.  Check oxygen saturations every shift 13.  Hyperlipidemia. Lipitor 14.  Trigeminal neuralgia.  Maintained on Lamictal 100 mg twice daily per neurology services 15. Loose stool: d/t abx  -abx completed today  -add probiotic, prn imodium  LOS: 4 days A FACE TO FACE EVALUATION WAS PERFORMED  Clide Deutscher Rether Rison 12/17/2019, 9:54 AM

## 2019-12-17 NOTE — Progress Notes (Signed)
[facility]  Subjective  - feels ok still feels swollen, thinks she is getting too many pills and too much pain medicine   Objective 123/67 100 98.4 F (36.9 C) (!) 22 95%  Intake/Output Summary (Last 24 hours) at 12/17/2019 1019 Last data filed at 12/17/2019 0730 Gross per 24 hour  Intake 540 ml  Output --  Net 540 ml   Still with pretibial edema  Assessment/Planning: Will continue slow diuresis, lasix again today and tomorrow Check BMET tomorrow  [name] 12/17/2019 10:19 AM --  Laboratory Lab Results: Recent Labs    12/16/19 0458 12/17/19 0937  WBC 12.2* PENDING  HGB 9.6* 9.3*  HCT 29.7* 29.1*  PLT 266 300   BMET Recent Labs    12/16/19 0458  NA 140  K 3.6  CL 104  CO2 23  GLUCOSE 118*  BUN 7*  CREATININE 0.91  CALCIUM 8.1*    COAG Lab Results  Component Value Date   INR 1.0 12/09/2019   INR 1.2 11/29/2019   INR 1.8 (A) 11/21/2019   No results found for: PTT

## 2019-12-18 ENCOUNTER — Inpatient Hospital Stay (HOSPITAL_COMMUNITY): Payer: Medicare Other | Admitting: Occupational Therapy

## 2019-12-18 ENCOUNTER — Inpatient Hospital Stay (HOSPITAL_COMMUNITY): Payer: Medicare Other

## 2019-12-18 DIAGNOSIS — R609 Edema, unspecified: Secondary | ICD-10-CM

## 2019-12-18 DIAGNOSIS — R600 Localized edema: Secondary | ICD-10-CM

## 2019-12-18 DIAGNOSIS — D72829 Elevated white blood cell count, unspecified: Secondary | ICD-10-CM

## 2019-12-18 DIAGNOSIS — F411 Generalized anxiety disorder: Secondary | ICD-10-CM

## 2019-12-18 DIAGNOSIS — R195 Other fecal abnormalities: Secondary | ICD-10-CM

## 2019-12-18 LAB — BASIC METABOLIC PANEL
Anion gap: 10 (ref 5–15)
BUN: 10 mg/dL (ref 8–23)
CO2: 27 mmol/L (ref 22–32)
Calcium: 7.9 mg/dL — ABNORMAL LOW (ref 8.9–10.3)
Chloride: 100 mmol/L (ref 98–111)
Creatinine, Ser: 0.91 mg/dL (ref 0.44–1.00)
GFR calc Af Amer: >60 mL/min (ref >60)
GFR calc non Af Amer: 54 mL/min — ABNORMAL LOW (ref >60)
Glucose, Bld: 146 mg/dL — ABNORMAL HIGH (ref 70–99)
Potassium: 4.3 mmol/L (ref 3.5–5.1)
Sodium: 137 mmol/L (ref 135–145)

## 2019-12-18 MED ORDER — CALCIUM POLYCARBOPHIL 625 MG PO TABS
625.0000 mg | ORAL_TABLET | Freq: Every day | ORAL | Status: DC
  Administered 2019-12-18 – 2019-12-24 (×7): 625 mg via ORAL
  Filled 2019-12-18 (×7): qty 1

## 2019-12-18 MED ORDER — POTASSIUM CHLORIDE ER 10 MEQ PO TBCR
40.0000 meq | EXTENDED_RELEASE_TABLET | Freq: Once | ORAL | Status: AC
  Administered 2019-12-18: 40 meq via ORAL
  Filled 2019-12-18 (×2): qty 4

## 2019-12-18 MED ORDER — FUROSEMIDE 10 MG/ML IJ SOLN
40.0000 mg | Freq: Once | INTRAMUSCULAR | Status: AC
  Administered 2019-12-18: 40 mg via INTRAVENOUS
  Filled 2019-12-18: qty 4

## 2019-12-18 MED ORDER — MELATONIN 3 MG PO TABS
3.0000 mg | ORAL_TABLET | Freq: Every evening | ORAL | Status: DC | PRN
  Administered 2019-12-18 – 2019-12-20 (×3): 3 mg via ORAL
  Filled 2019-12-18 (×3): qty 1

## 2019-12-18 NOTE — Progress Notes (Signed)
Occupational Therapy Session Note  Patient Details  Name: [name] [name] MRN: [id number] Date of Birth: 19-Jan-1925  Today's Date: 12/18/2019 OT Individual Time: 9735-3299 OT Individual Time Calculation (min): 57 min    Short Term Goals: Week 1:  OT Short Term Goal 1 (Week 1): STG=LTG  Skilled Therapeutic Interventions/Progress Updates:    Treatment session with focus on functional transfers and activity tolerance/endurance.  Pt received semi-reclined in bed with son present in room.  Pt reports pain in B feet, not rated, due to swelling.  Engaged in discussion regarding management for swelling with pt reporting voiding all night due to lasix.  Pt reports need to toilet.  Ambulated to room with RW with CGA when navigating threshold in to bathroom.  Pt able to adjust clothing prior to and post toileting, except required assistance with incontinence brief.  Pt required assistance for hygiene post BM, despite attempts to complete with lateral leans on toilet.  Therapist provided standard BSC to accommodate for her height to allow for use during night time due to frequency.  Pt and son in agreement for use of BSC at home for urgency.  Engaged in walk-in shower transfers over 3" ledge with RW, pt able to complete with CGA taking RW in to shower as pt reports large shower at home.  Engaged in bed mobility in ADL apt in to standard bed as pt and son with questions about height of her bed and safety at home.  Encouraged pt's son to measure bed with and without risers to assess appropriate height for home.  Pt completed bed mobility with CGA to standard bed in ADL apt, therapist providing cues for technique to increase safety and independence with bed mobility.  Pt returned to room and transferred back to bed with RW with close supervision and left in sidelying with all needs in reach.  Therapy Documentation Precautions:  Precautions Precautions: Fall Precaution Comments: watch HR  Restrictions Weight  Bearing Restrictions: No General:   Vital Signs: Therapy Vitals Pulse Rate: 100 BP: 110/81 Pain:  Pt with c/o pain in B feet, due to swelling.  Not rated.   Therapy/Group: Individual Therapy  Simonne Come 12/18/2019, 10:51 AM

## 2019-12-18 NOTE — Progress Notes (Addendum)
[name] PHYSICAL MEDICINE & REHABILITATION PROGRESS NOTE   Subjective/Complaints: Patient seen laying in bed this AM.  She states she did not sleep well overnight due to urinary freq and freq bowel movements.  She notes sensation in feet and edema are improving.    ROS: Denies CP, SOB, N/V/D  Objective:   No results found. Recent Labs    12/16/19 0458 12/17/19 0937  WBC 12.2* 14.4*  HGB 9.6* 9.3*  HCT 29.7* 29.1*  PLT 266 300   Recent Labs    12/16/19 0458 12/18/19 0605  NA 140 137  K 3.6 4.3  CL 104 100  CO2 23 27  GLUCOSE 118* 146*  BUN 7* 10  CREATININE 0.91 0.91  CALCIUM 8.1* 7.9*    Intake/Output Summary (Last 24 hours) at 12/18/2019 0951 Last data filed at 12/17/2019 1816 Gross per 24 hour  Intake 480 ml  Output --  Net 480 ml     Physical Exam: Vital Signs Blood pressure 110/81, pulse 100, temperature 97.9 F (36.6 C), resp. rate 18, height 4\' 11"  (1.499 m), weight 78.5 kg, SpO2 94 %. Constitutional: No distress . Vital signs reviewed. HENT: Normocephalic.  Atraumatic. Eyes: EOMI. No discharge. Cardiovascular: No JVD. Irregularly irregular. Respiratory: Normal effort.  No stridor. Bilaterally clear to auscultation.  GI: Non-distended. BS+.  Skin: RLE incision CDI. Psych: Normal mood.  Normal behavior. Musc: No edema in extremities.  No tenderness in extremities. Neuro: Alert Motor: B/l UE 5/5 RLE: 3/5 proximally, 4/5 ADF (pain inhibition) LLE: 4/5 proximal to distal  Assessment/Plan: 1. Functional deficits secondary to PAD s/p right FPBG which require 3+ hours per day of interdisciplinary therapy in a comprehensive inpatient rehab setting.  Physiatrist is providing close team supervision and 24 hour management of active medical problems listed below.  Physiatrist and rehab team continue to assess barriers to discharge/monitor patient progress toward functional and medical goals  Care Tool:  Bathing    Body parts bathed by patient: Right  arm, Left arm, Chest, Abdomen, Front perineal area, Buttocks, Right upper leg, Left upper leg, Face   Body parts bathed by helper: Right lower leg, Left lower leg     Bathing assist Assist Level: Minimal Assistance - Patient > 75%     Upper Body Dressing/Undressing Upper body dressing Upper body dressing/undressing activity did not occur (including orthotics): N/A What is the patient wearing?: Pull over shirt    Upper body assist Assist Level: Contact Guard/Touching assist (due to IV)    Lower Body Dressing/Undressing Lower body dressing      What is the patient wearing?: Pants     Lower body assist Assist for lower body dressing: Maximal Assistance - Patient 25 - 49%     Toileting Toileting    Toileting assist Assist for toileting: Moderate Assistance - Patient 50 - 74%     Transfers Chair/bed transfer  Transfers assist     Chair/bed transfer assist level: Contact Guard/Touching assist     Locomotion Ambulation   Ambulation assist      Assist level: Contact Guard/Touching assist Assistive device: Walker-rolling Max distance: 166ft   Walk 10 feet activity   Assist     Assist level: Contact Guard/Touching assist Assistive device: Walker-rolling   Walk 50 feet activity   Assist    Assist level: Contact Guard/Touching assist Assistive device: Walker-rolling    Walk 150 feet activity   Assist Walk 150 feet activity did not occur: Safety/medical concerns  Assist level: Contact Guard/Touching assist Assistive  device: Walker-rolling    Walk 10 feet on uneven surface  activity   Assist Walk 10 feet on uneven surfaces activity did not occur: Safety/medical concerns         Wheelchair     Assist Will patient use wheelchair at discharge?: No Type of Wheelchair: Manual    Wheelchair assist level: Supervision/Verbal cueing Max wheelchair distance: 13ft    Wheelchair 50 feet with 2 turns activity    Assist             Wheelchair 150 feet activity     Assist          Blood pressure 110/81, pulse 100, temperature 97.9 F (36.6 C), resp. rate 18, height 4\' 11"  (1.499 m), weight 78.5 kg, SpO2 94 %.  Medical Problem List and Plan: 1.  Decreased functional mobility secondary to PVD status post redo right femoral to below-knee popliteal bypass as well as left common femoral enterectomy 12/09/2019.  Follow-up vascular surgery Dr. Oneida Alar  Continue CIR  Team conference today to discuss current and goals and coordination of care, home and environmental barriers, and discharge planning with nursing, case manager, and therapies. Please see conference note from today as well.  2.  Antithrombotics: -DVT/anticoagulation: Eliquis             -antiplatelet therapy: Aspirin 81 mg daily 3.  Failed back syndrome/pain Management:  Oxycodone as needed.   Controlled with meds on 8/25             Monitor with increased exertion 4. Mood: Provide emotional support             -antipsychotic agents: N/A  Klonopin for anxiety  Robitussin PRN.   Lavender drops on forehead at night and chamomile tea with dinner.  5. Neuropsych: This patient is capable of making decisions on her own behalf. 6. Skin/Wound Care: Routine skin checks  -place wash rag in right groin to help with skin contact 7. Fluids/Electrolytes/Nutrition: Routine in and outs.  8.  Acute blood loss anemia.    Hgb 9.3 on 8/24  Continue to monitor 9.  Rectal bleeding .  Follow-up GI.  CT of abdomen consistent with ischemic colitis.            See #8             Ancef completed. Last dose flagyl on 8/22  -recheck labs monday 10.  Atrial fibrillation RVR.  Follow-up cardiology services.. Continue Toprol-XL 50 mg daily             Metoprolol to 75mg  daily  Filed Weights   12/15/19 0328 12/16/19 0500 12/18/19 0346  Weight: 82.7 kg 81.3 kg 78.5 kg  11.  GERD.  Protonix 12.  COPD.  Check oxygen saturations every shift 13.  Hyperlipidemia. Lipitor 14.   Trigeminal neuralgia.  Maintained on Lamictal 100 mg twice daily per neurology services 15. Loose stool: d/t abx  -abx completed   -add probiotic, prn imodium  Fiber added on 8/25 16. Leukocytosis  WBCs 14.4 on 8/24, labs ordered for tomorrow  Afebrile 17. Peripheral edema  Lasix started by vascular, edema improving  Periwick ordered qhs   LOS: 5 days A FACE TO FACE EVALUATION WAS PERFORMED  [name] [name] [name] [name] 12/18/2019, 9:51 AM

## 2019-12-18 NOTE — Progress Notes (Signed)
Pt requested medication for sleep. On-call provider notified. Received verbal order of PRN 3 mg melatonin at night.

## 2019-12-18 NOTE — Patient Care Conference (Signed)
Inpatient RehabilitationTeam Conference and Plan of Care Update Date: 12/18/2019   Time: 11:30 AM    Patient Name: [name] [name]      Medical Record Number: [id number]  Date of Birth: 1924-07-04 Sex: Female         Room/Bed: 4W01C/4W01C-01 Payor Info: Payor: MEDICARE / Plan: MEDICARE PART A AND B / Product Type: *No Product type* /    Admit Date/Time:  12/13/2019  5:01 PM  Primary Diagnosis:  PAOD (peripheral arterial occlusive disease) [facility])  Hospital Problems: Principal Problem:   PAOD (peripheral arterial occlusive disease) ([place]) Active Problems:   PAD (peripheral artery disease) ([place])   Debility   Anxiety state   Peripheral edema   Leukocytosis   Loose stools    Expected Discharge Date: Expected Discharge Date: 12/24/19  Team Members Present: Physician leading conference: Dr. Delice Lesch Care Coodinator Present: Dorien Chihuahua, RN, BSN, CRRN;Other (comment) Jacqlyn Larsen [place], SW) Nurse Present: Toy Cookey, LPN PT Present: Becky Sax, PT OT Present: Simonne Come, OT PPS Coordinator present : Ileana Ladd, Burna Mortimer, SLP     Current Status/Progress Goal Weekly Team Focus  Bowel/Bladder   Pt is continent  Pt will remain continent  Assess pt for incontinence q shift and as needed.   Swallow/Nutrition/ Hydration             ADL's   CGA transfers standing balance, and UB dressing, bathing Min assist, Max assist LB dressing  Mod I overall, Supervision shower transfer, bathing at shower level, and meal prep  ADL retraining, LB dressing, dynamic standing balance, endurance, transfers, family education, d/c planning   Mobility   bed mobility supervision, transfers without AD min A, gait 171ft with RW CGA  Supervision  functional mobility/transfers, generalized strengthening, dynamic standing balance/coordination, ambulation, endurance, D/C planning, family education   Communication             Safety/Cognition/ Behavioral Observations            Pain   Pt C/O  of generall body pain  pain will be controlled with pharmacolodical and non pharmacological therapies  Assess pain q shift and as needed.   Skin               Discharge Planning:  Home with family members assisting-son to stay with for the firt week to make sure transition is smooth. Children here daily   Team Discussion: Continue to note shortness of breath and anxiety issues.  Frequent voiding due to diuretics; purewick for use HS for now however continue to note edema in feet.  Patient on target to meet rehab goals: yes, supervision goals set with [facility] follow up  *See Care Plan and progress notes for long and short-term goals.   Revisions to Treatment Plan:   Teaching Needs: Transfers, toileting, ADL assistance, medications, etc.  Current Barriers to Discharge: Incontinence and Lived alone PTA  Possible Resolutions to Barriers: Discharge home with daughter     Medical Summary Current Status: Decreased functional mobility secondary to PVD status post redo right femoral to below-knee popliteal bypass as well as left common femoral enterectomy 12/09/2019.  Follow-up vascular surgery Dr. Oneida Alar  Barriers to Discharge: Medical stability;Wound care;Weight   Possible Resolutions to [employer] Focus: Therapies, diuesis for edema, fiber added, follow labs   Continued Need for Acute Rehabilitation Level of Care: The patient requires daily medical management by a physician with specialized training in physical medicine and rehabilitation for the following reasons: Direction of a multidisciplinary physical rehabilitation  program to maximize functional independence : Yes Medical management of patient stability for increased activity during participation in an intensive rehabilitation regime.: Yes Analysis of laboratory values and/or radiology reports with any subsequent need for medication adjustment and/or medical intervention. : Yes   I attest that I was present, lead the team  conference, and concur with the assessment and plan of the team.   Dorien Chihuahua B 12/18/2019, 3:54 PM

## 2019-12-18 NOTE — Progress Notes (Signed)
BMET reviewed BUN slowly increasing normal CR and potassium Will give one last dose of lasix today to help with leg edema  [name], MD [facility] Office: [phone]

## 2019-12-18 NOTE — Plan of Care (Signed)
  Problem: Consults Goal: RH GENERAL PATIENT EDUCATION Description: See Patient Education module for education specifics. Outcome: Progressing   Problem: RH SKIN INTEGRITY Goal: RH STG SKIN FREE OF INFECTION/BREAKDOWN Description: Free of breakdown and infection while on rehab with min assist Outcome: Progressing Goal: RH STG ABLE TO PERFORM INCISION/WOUND CARE W/ASSISTANCE Description: STG Able To Perform Incision/Wound Care With Assistance. Min Outcome: Progressing   Problem: RH PAIN MANAGEMENT Goal: RH STG PAIN MANAGED AT OR BELOW PT'S PAIN GOAL Description: Less than 3 Outcome: Progressing

## 2019-12-18 NOTE — Progress Notes (Signed)
Physical Therapy Session Note  Patient Details  Name: [name] [name] MRN: [id number] Date of Birth: [birth year]  Today's Date: 12/18/2019 PT Individual Time: [phone] and 1345-1459 PT Individual Time Calculation (min): 57 min and 74 min  Short Term Goals: Week 1:  PT Short Term Goal 1 (Week 1): STGs=LTGs due to ELOS  Skilled Therapeutic Interventions/Progress Updates:   Treatment Session 1: [phone] 57 min Received pt supine in bed with RN present administering medications, pt agreeable to therapy, and denied any pain during session. MD present for morning rounds. Session with emphasis on functional mobility/transfers, dressing, generalized strengthening, dynamic standing balance/coordination, ambulation, curb navigation, and improved activity tolerance. Pt performed bed mobility with supervision with HOB elevated and use of bedrails x 2 trials throughout session. Doffed nightgown and donned clean pull over shirt with set up assist. Donned pants sitting EOB with mod A and pt transferred sit<>stand with RW CGA and required mod A to pull pants over hips. Stand<>pivot bed<>WC with RW and CGA with cues for turning technique and RW safety. Pt brushed teeth, combed hair, shaved chin, and washed face sitting in WC at sink with supervision. Discussed D/C planning as pt with more questions regarding SNF. Therapist explained to pt that with her CLOF and the 24/7 availability of her children to assist at home, that she wouldn't need to go to a SNF; pt very pleased. Pt ambulated 10ft x 1 and 56ft x 1 with RW and CGA and transported remainder of way to therapy gym in [facility] total A. O2 sat 97% and HR 92bpm. Pt navigated 1 curb with RW and min A with cues for RW safety, body positioning, and overall technique to simulate home environment. Pt transported back to room in [facility] total A and ambulated 16ft with min handheld A back to bed. Concluded session with pt supine in bed, needs within reach, and bed alarm on. Pt's son  present at bedside. Therapist updated pt's son on pt's CLOF.   Treatment Session 2: [id number] 74 min Received pt sitting EOB with son present at bedside, pt agreeable to therapy, and denied any pain at start of session but reported increased foot pain when ambulating. Session with emphasis on functional mobility/transfers, toileting, generalized strengthening, dynamic standing balance/coordination, simulated car transfers, ambulation, and improved activity tolerance. Pt transferred sit<>stand with RW CGA and ambulated 45ft x 2 trials to/from bathroom with RW and CGA with cues for RW safety to navigate threshold in bathroom. Pt able to void and perform hygiene management with supervision and required mod A for clothing management. Pt sat in [place] and washed hands at sink with supervision. [employer] [facility] called discussing RW and pt reported she thinks she may have a youth sized RW at home (need to follow up on this). Pt ambulated 92ft with RW and CGA with cues for increased stride length. Pt reported increased bilateral foot pain and requested to sit. Pt transported remainder of way to ortho gym in [facility] total A and stood and worked on bilateral UE strengthening on UBE at level 0 for 2 minutes forward and 2 minutes backward with supervision and 1 seated rest break. Pt ambulated 77ft on uneven surfaces (ramp) with RW and CGA and performed ambulatory car transfer with RW and CGA. Pt transported to dayroom in [facility] total A and worked on dynamic standing balance without AD and CGA playing Wii bowling. Pt transferred sit<>stand x10 reps and able to remain standing for 30-45 seconds intervals for a total of approximately 20 minutes.  Pt transported back to room in [facility] total A and transferred WC<>bed stand<>pivot without AD and CGA and sit<>supine with supervision. Concluded session with pt supine in bed, needs within reach, and bed alarm on.   Therapy Documentation Precautions:  Precautions Precautions: Fall Precaution  Comments: watch HR  Restrictions Weight Bearing Restrictions: No  Therapy/Group: Individual Therapy Alfonse Alpers PT, DPT   12/18/2019, 7:18 AM

## 2019-12-18 NOTE — Progress Notes (Signed)
Patient ID: [name] [name], female   DOB: May 11, 1924, 86 y.o.   MRN: [id number]   Met with pt and son who is in the room to discuss team conference goals supervision-mod/i level and discharge date 8/31. She feels like she is getting better but needs to get stronger and knows this will take time. Son wants to be sure she is doing well and does not require physical care at discharge since he has back issues. Will talk with therapists to have him attend therapies with pt to see her progress, this should allievate his concerns. Discussed equipment needs and home health follow up needs. No preference will work on discharge needs.

## 2019-12-18 NOTE — Progress Notes (Signed)
Physical Therapy Note  Patient Details  Name: [name] [name] MRN: [id number] Date of Birth: [birth year] Today's Date: 12/18/2019  Recommending the following equipment to increase functional independence with mobility:  ~ RW (youth)   Alfonse Alpers PT, DPT   12/18/2019, 3:19 PM

## 2019-12-19 ENCOUNTER — Inpatient Hospital Stay (HOSPITAL_COMMUNITY): Payer: Medicare Other | Admitting: Physical Therapy

## 2019-12-19 ENCOUNTER — Inpatient Hospital Stay (HOSPITAL_COMMUNITY): Payer: Medicare Other

## 2019-12-19 ENCOUNTER — Inpatient Hospital Stay (HOSPITAL_COMMUNITY): Payer: Medicare Other | Admitting: Occupational Therapy

## 2019-12-19 LAB — BASIC METABOLIC PANEL
Anion gap: 10 (ref 5–15)
BUN: 10 mg/dL (ref 8–23)
CO2: 29 mmol/L (ref 22–32)
Calcium: 7.9 mg/dL — ABNORMAL LOW (ref 8.9–10.3)
Chloride: 98 mmol/L (ref 98–111)
Creatinine, Ser: 0.89 mg/dL (ref 0.44–1.00)
GFR calc Af Amer: >60 mL/min (ref >60)
GFR calc non Af Amer: 56 mL/min — ABNORMAL LOW (ref >60)
Glucose, Bld: 182 mg/dL — ABNORMAL HIGH (ref 70–99)
Potassium: 4.1 mmol/L (ref 3.5–5.1)
Sodium: 137 mmol/L (ref 135–145)

## 2019-12-19 LAB — CBC WITH DIFFERENTIAL/PLATELET
Abs Immature Granulocytes: 0.86 10*3/uL — ABNORMAL HIGH (ref 0.00–0.07)
Basophils Absolute: 0.1 10*3/uL (ref 0.0–0.1)
Basophils Relative: 1 %
Eosinophils Absolute: 0.3 10*3/uL (ref 0.0–0.5)
Eosinophils Relative: 2 %
HCT: 30.3 % — ABNORMAL LOW (ref 36.0–46.0)
Hemoglobin: 9.8 g/dL — ABNORMAL LOW (ref 12.0–15.0)
Immature Granulocytes: 5 %
Lymphocytes Relative: 10 %
Lymphs Abs: 1.7 10*3/uL (ref 0.7–4.0)
MCH: 30.1 pg (ref 26.0–34.0)
MCHC: 32.3 g/dL (ref 30.0–36.0)
MCV: 92.9 fL (ref 80.0–100.0)
Monocytes Absolute: 0.9 10*3/uL (ref 0.1–1.0)
Monocytes Relative: 5 %
Neutro Abs: 13.7 10*3/uL — ABNORMAL HIGH (ref 1.7–7.7)
Neutrophils Relative %: 77 %
Platelets: 314 10*3/uL (ref 150–400)
RBC: 3.26 MIL/uL — ABNORMAL LOW (ref 3.87–5.11)
RDW: 14.8 % (ref 11.5–15.5)
WBC: 17.5 10*3/uL — ABNORMAL HIGH (ref 4.0–10.5)
nRBC: 0.1 % (ref 0.0–0.2)

## 2019-12-19 LAB — URINE CULTURE: Culture: 30000 — AB

## 2019-12-19 MED ORDER — APIXABAN 2.5 MG PO TABS: 2.5000 mg | ORAL_TABLET | Freq: Two times a day (BID) | ORAL | 0 refills | Status: DC

## 2019-12-19 MED ORDER — CEPHALEXIN 250 MG PO CAPS
500.0000 mg | ORAL_CAPSULE | Freq: Two times a day (BID) | ORAL | Status: AC
  Administered 2019-12-19 – 2019-12-24 (×10): 500 mg via ORAL
  Filled 2019-12-19 (×10): qty 2

## 2019-12-19 NOTE — Progress Notes (Signed)
Physical Therapy Session Note  Patient Details  Name: [name] [name] MRN: [id number] Date of Birth: Jul 06, 1924  Today's Date: 12/19/2019 PT Individual Time: 1100-1200 PT Individual Time Calculation (min): 60 min   Short Term Goals: Week 1:  PT Short Term Goal 1 (Week 1): STGs=LTGs due to ELOS  Skilled Therapeutic Interventions/Progress Updates:    Pt received sitting in w/c, initially very hesitant to participate in PT session due to having a busy morning with other therapies. Pt requiring extra time and motivation/encouragement to participate. Pt eventually agreeable, especially when provided option to go outside to the fountain near vascular center. Daughter arriving soon after where she joined [place] outside for session. Pt transported in w/c with totalA for time management. While outside, pt ambulated 3x77ft (seated rest breaks b/w bouts) with SBA/CGA and RW, demo's decreased B step length/height, light reliance of BUE's through RW, mild increased work of breathing after each effort. Pt also completed 1x10 sit<>stands from w/c level focusing on BLE power and controlled lowering. Pt then transported back inside to rehab floor where she performed the following seated there-ex, AROM 1x15 BUE with 4# dowel rod: -bicep curls -front arm shldr raises -chest press -shldr press  Pt then performed stand<>pivot to bed with CGA and no AD, sit>supine with CGA. Ended session semi-reclined in bed, bed alarm on, needs in reach, daughter at bedside.  Therapy Documentation Precautions:  Precautions Precautions: Fall Precaution Comments: watch HR  Restrictions Weight Bearing Restrictions: No  Therapy/Group: Individual Therapy  Abigail Marsiglia P Matsue Strom PT 12/19/2019, 12:57 PM

## 2019-12-19 NOTE — Progress Notes (Signed)
  Progress Note    12/19/2019 7:46 AM * No surgery found *  Subjective: no major complaints. Still having a lot of swelling in her legs. Also stating her feet are very sore especially when weight bearing   Vitals:   12/19/19 0415 12/19/19 0500  BP: 136/73 (!) 117/59  Pulse: 99 94  Resp: 20 20  Temp: 98.2 F (36.8 C) 97.8 F (36.6 C)  SpO2: 98% 95%   Physical Exam: Cardiac: regular Lungs: non labored Incisions: bilateral groin incisions intact. Some maceration of edges of incision, Dry gauze applied. No signs of infection, no drainage. Right popliteal incision clean, dry and intact Extremities: bilateral lower extremities well perfused and warm. Edematous Abdomen: obese, soft, non tender Neurologic: alert and oriented  CBC    Component Value Date/Time   WBC 14.4 (H) 12/17/2019 0937   RBC 3.15 (L) 12/17/2019 0937   HGB 9.3 (L) 12/17/2019 0937   HCT 29.1 (L) 12/17/2019 0937   PLT 300 12/17/2019 0937   MCV 92.4 12/17/2019 0937   MCH 29.5 12/17/2019 0937   MCHC 32.0 12/17/2019 0937   RDW 14.9 12/17/2019 0937   LYMPHSABS 1.4 12/17/2019 0937   MONOABS 1.3 (H) 12/17/2019 0937   EOSABS 0.2 12/17/2019 0937   BASOSABS 0.0 12/17/2019 0937    BMET    Component Value Date/Time   NA 137 12/18/2019 0605   NA 141 11/27/2019 1026   K 4.3 12/18/2019 0605   CL 100 12/18/2019 0605   CO2 27 12/18/2019 0605   GLUCOSE 146 (H) 12/18/2019 0605   BUN 10 12/18/2019 0605   BUN 26 11/27/2019 1026   CREATININE 0.91 12/18/2019 0605   CREATININE 1.17 (H) 05/08/2019 1402   CALCIUM 7.9 (L) 12/18/2019 0605   GFRNONAA 54 (L) 12/18/2019 0605   GFRAA >60 12/18/2019 0605    INR    Component Value Date/Time   INR 1.0 12/09/2019 0637     Intake/Output Summary (Last 24 hours) at 12/19/2019 0746 Last data filed at 12/19/2019 0726 Gross per 24 hour  Intake 540 ml  Output 250 ml  Net 290 ml     Assessment/Plan:  86 y.o. female is s/p redo of right fem-bk pop bypass with PTFE and left  CF endarterectomy. Bilateral lower extremities well perfused. Dry gauze to bilateral groins to avoid dehiscence. Labs stable. Still having a lot of lower extremity edema. I have encouraged her to elevate her legs while in bed. Continue to mobilize   Karoline Caldwell, [facility] [phone] 12/19/2019 7:46 AM

## 2019-12-19 NOTE — Progress Notes (Signed)
Occupational Therapy Session Note  Patient Details  Name: [name] [name] MRN: [id number] Date of Birth: 23-Jun-1924  Today's Date: 12/19/2019 OT Individual Time: 0900-1000   &   1500-1527 OT Individual Time Calculation (min): 60 min  &  27 min   Short Term Goals: Week 1:  OT Short Term Goal 1 (Week 1): STG=LTG  Skilled Therapeutic Interventions/Progress Updates:    AM session:   Patient in bed, alert and ready for therapy session.  She denies pain at this time and requests to walk to toilet this am.  Supine to sitting edge of bed with CS.  Sit to stand and ambulation with RW to/from bed, toilet and w/c with CGA.  toileting completed with min A.   She sat in w/c at sink to complete bathing/dressing and grooming tasks.  Set up for grooming and UB bathing/dressing.   Assistance to wash bilateral lower legs, mod A for pants, dependent to donn teds, CM in stance CGA, slipper socks min A with sock aide after initial instruction.  Completed sit to stand reps with good tolerance.   Reviewed and practiced incentive spirometer - ongoing cues for technique (will review again this afternoon)  She remained seated in w/c at close of session with seat belt alarm on and call bell in reach.     PM session:   Patient in bed, alert and ready for afternoon therapy although she states that she is tired.  She denies pain and agrees to plan for treatment.   Supine to sitting edge of bed with CS.  She ambulated w/ RW from bed to bathroom with CGA.  Reviewed and practiced shower transfers simulating her set up at home - she was able to complete with CGA.  She then ambulated w/ RW to arm chair in room CGA.  Reviewed and practiced incentive spirometer - moderate cues and repetition in order to use properly.   Ambulation with RW back to bed CGA, sitting to supine with CS.  She remained in bed at close of session, bed alarm set and call bell in hand.    Therapy Documentation Precautions:  Precautions Precautions:  Fall Precaution Comments: watch HR  Restrictions Weight Bearing Restrictions: No   Therapy/Group: Individual Therapy  Carlos Levering 12/19/2019, 7:34 AM

## 2019-12-19 NOTE — Progress Notes (Signed)
Physical Therapy Session Note  Patient Details  Name: [name] [name] MRN: [id number] Date of Birth: [birth year]  Today's Date: 12/19/2019 PT Individual Time: 1030-1058 PT Individual Time Calculation (min): 28 min   Short Term Goals: Week 1:  PT Short Term Goal 1 (Week 1): STGs=LTGs due to ELOS  Skilled Therapeutic Interventions/Progress Updates: Pt presents in w/c, falling asleep, but agreeable to therapy.  Pt wheeled to dayroom for time conservation.  Pt performed multiple sit to stand transfers w/ CGA from w/c.  Pt amb multiple times up to 47' including negotiating cones and turns to return to seat w/ CGA.  Pt states increased pain B feet 2/2 swelling but does not wish pain meds. Pt requires verbal cues for improved approach to seat managing RW and to complete turn to seated surface.  Pt returned to room and chair alarm on w/ all needs in reach.      Therapy Documentation Precautions:  Precautions Precautions: Fall Precaution Comments: watch HR  Restrictions Weight Bearing Restrictions: No General:   Vital Signs:  Pain: 5/10 when WB on feet.      Therapy/Group: Individual Therapy  [name] [name] 12/19/2019, 12:01 PM

## 2019-12-19 NOTE — Progress Notes (Addendum)
PHYSICAL MEDICINE & REHABILITATION PROGRESS NOTE   Subjective/Complaints: No complaint this morning. Feeling better. WBC trending upward to 17.5.   ROS: Denies CP, SOB, N/V/D  Objective:   No results found. Recent Labs    12/17/19 0937 12/19/19 0734  WBC 14.4* 17.5*  HGB 9.3* 9.8*  HCT 29.1* 30.3*  PLT 300 314   Recent Labs    12/18/19 0605 12/19/19 0734  NA 137 137  K 4.3 4.1  CL 100 98  CO2 27 29  GLUCOSE 146* 182*  BUN 10 10  CREATININE 0.91 0.89  CALCIUM 7.9* 7.9*    Intake/Output Summary (Last 24 hours) at 12/19/2019 1005 Last data filed at 12/19/2019 0726 Gross per 24 hour  Intake 540 ml  Output 250 ml  Net 290 ml     Physical Exam: Vital Signs Blood pressure (!) 117/59, pulse 94, temperature 97.8 F (36.6 C), resp. rate 20, height 4\' 11"  (1.499 m), weight 76 kg, SpO2 95 %. General: Alert and oriented x 3, No apparent distress HEENT: Head is normocephalic, atraumatic, PERRLA, EOMI, sclera anicteric, oral mucosa pink and moist, dentition intact, ext ear canals clear,  Neck: Supple without JVD or lymphadenopathy Heart: Reg rate and rhythm. No murmurs rubs or gallops Chest: CTA bilaterally without wheezes, rales, or rhonchi; no distress Abdomen: Soft, non-tender, non-distended, bowel sounds positive. Extremities: No clubbing, cyanosis, or edema. Pulses are 2+ Skin: RLE incision CDI. Psych: Normal mood.  Normal behavior. Musc: No edema in extremities.  No tenderness in extremities. Neuro: Alert Motor: B/l UE 5/5 RLE: 3/5 proximally, 4/5 ADF (pain inhibition) LLE: 4/5 proximal to distal  Assessment/Plan: 1. Functional deficits secondary to PAD s/p right FPBG which require 3+ hours per day of interdisciplinary therapy in a comprehensive inpatient rehab setting.  Physiatrist is providing close team supervision and 24 hour management of active medical problems listed below.  Physiatrist and rehab team continue to assess barriers to  discharge/monitor patient progress toward functional and medical goals  Care Tool:  Bathing    Body parts bathed by patient: Right arm, Left arm, Chest, Abdomen, Front perineal area, Buttocks, Right upper leg, Left upper leg, Face   Body parts bathed by helper: Right lower leg, Left lower leg     Bathing assist Assist Level: Minimal Assistance - Patient > 75%     Upper Body Dressing/Undressing Upper body dressing Upper body dressing/undressing activity did not occur (including orthotics): N/A What is the patient wearing?: Pull over shirt    Upper body assist Assist Level: Contact Guard/Touching assist (due to IV)    Lower Body Dressing/Undressing Lower body dressing      What is the patient wearing?: Pants     Lower body assist Assist for lower body dressing: Maximal Assistance - Patient 25 - 49%     Toileting Toileting    Toileting assist Assist for toileting: Moderate Assistance - Patient 50 - 74%     Transfers Chair/bed transfer  Transfers assist     Chair/bed transfer assist level: Contact Guard/Touching assist     Locomotion Ambulation   Ambulation assist      Assist level: Contact Guard/Touching assist Assistive device: Walker-rolling Max distance: 164ft   Walk 10 feet activity   Assist     Assist level: Contact Guard/Touching assist Assistive device: Walker-rolling   Walk 50 feet activity   Assist    Assist level: Contact Guard/Touching assist Assistive device: Walker-rolling    Walk 150 feet activity   Assist Walk  150 feet activity did not occur: Safety/medical concerns  Assist level: Contact Guard/Touching assist Assistive device: Walker-rolling    Walk 10 feet on uneven surface  activity   Assist Walk 10 feet on uneven surfaces activity did not occur: Safety/medical concerns   Assist level: Contact Guard/Touching assist Assistive device: Aeronautical engineer Will patient use wheelchair at  discharge?: No Type of Wheelchair: Manual    Wheelchair assist level: Supervision/Verbal cueing Max wheelchair distance: 98ft    Wheelchair 50 feet with 2 turns activity    Assist            Wheelchair 150 feet activity     Assist          Blood pressure (!) 117/59, pulse 94, temperature 97.8 F (36.6 C), resp. rate 20, height 4\' 11"  (1.499 m), weight 76 kg, SpO2 95 %.  Medical Problem List and Plan: 1.  Decreased functional mobility secondary to PVD status post redo right femoral to below-knee popliteal bypass as well as left common femoral enterectomy 12/09/2019.  Follow-up vascular surgery Dr. Oneida Alar  Continue CIR 2.  Antithrombotics: -DVT/anticoagulation: Eliquis             -antiplatelet therapy: Aspirin 81 mg daily 3.  Failed back syndrome/pain Management:  Oxycodone as needed.   Controlled with meds 8/26             Monitor with increased exertion 4. Mood: Provide emotional support             -antipsychotic agents: N/A  Klonopin for anxiety  Robitussin PRN.   Lavender drops on forehead at night and chamomile tea with dinner.  5. Neuropsych: This patient is capable of making decisions on her own behalf. 6. Skin/Wound Care: Routine skin checks  -place wash rag in right groin to help with skin contact 7. Fluids/Electrolytes/Nutrition: Routine in and outs.  8.  Acute blood loss anemia.    Hgb 9.3 on 8/24  Continue to monitor 9.  Rectal bleeding .  Follow-up GI.  CT of abdomen consistent with ischemic colitis.            See #8             Ancef completed. Last dose flagyl on 8/22  -recheck labs monday 10.  Atrial fibrillation RVR.  Follow-up cardiology services.. Continue Toprol-XL 50 mg daily             Metoprolol to 75mg  daily   8/26: slightly elevated to 94.  Filed Weights   12/16/19 0500 12/18/19 0346 12/19/19 0418  Weight: 81.3 kg 78.5 kg 76 kg  11.  GERD.  Protonix 12.  COPD.  Check oxygen saturations every shift 13.  Hyperlipidemia.  Lipitor 14.  Trigeminal neuralgia.  Maintained on Lamictal 100 mg twice daily per neurology services 15. Loose stool: d/t abx  -abx completed   -add probiotic, prn imodium  Fiber added on 8/25 16. Leukocytosis  WBCs 14.4 on 8/24, UA +. Keflex started. Repeat culture ordered since prior had multiple species. Repeat CBC ordered for tomorrow to trend leukocytosis which increased 8/26  Afebrile 17. Peripheral edema  Lasix started by vascular, edema stable  Periwick ordered qhs   LOS: 6 days A FACE TO FACE EVALUATION WAS PERFORMED  Melvin Marmo P Fabienne Nolasco 12/19/2019, 10:05 AM

## 2019-12-20 ENCOUNTER — Inpatient Hospital Stay (HOSPITAL_COMMUNITY): Payer: Medicare Other

## 2019-12-20 ENCOUNTER — Inpatient Hospital Stay (HOSPITAL_COMMUNITY): Payer: Medicare Other | Admitting: Occupational Therapy

## 2019-12-20 DIAGNOSIS — N390 Urinary tract infection, site not specified: Secondary | ICD-10-CM

## 2019-12-20 DIAGNOSIS — I739 Peripheral vascular disease, unspecified: Secondary | ICD-10-CM

## 2019-12-20 LAB — URINE CULTURE

## 2019-12-20 LAB — CBC WITH DIFFERENTIAL/PLATELET
Abs Immature Granulocytes: 0.67 10*3/uL — ABNORMAL HIGH (ref 0.00–0.07)
Basophils Absolute: 0.1 10*3/uL (ref 0.0–0.1)
Basophils Relative: 1 %
Eosinophils Absolute: 0.3 10*3/uL (ref 0.0–0.5)
Eosinophils Relative: 2 %
HCT: 27.8 % — ABNORMAL LOW (ref 36.0–46.0)
Hemoglobin: 8.9 g/dL — ABNORMAL LOW (ref 12.0–15.0)
Immature Granulocytes: 5 %
Lymphocytes Relative: 11 %
Lymphs Abs: 1.7 10*3/uL (ref 0.7–4.0)
MCH: 29.7 pg (ref 26.0–34.0)
MCHC: 32 g/dL (ref 30.0–36.0)
MCV: 92.7 fL (ref 80.0–100.0)
Monocytes Absolute: 0.8 10*3/uL (ref 0.1–1.0)
Monocytes Relative: 6 %
Neutro Abs: 11.4 10*3/uL — ABNORMAL HIGH (ref 1.7–7.7)
Neutrophils Relative %: 75 %
Platelets: 263 10*3/uL (ref 150–400)
RBC: 3 MIL/uL — ABNORMAL LOW (ref 3.87–5.11)
RDW: 14.7 % (ref 11.5–15.5)
WBC: 14.9 10*3/uL — ABNORMAL HIGH (ref 4.0–10.5)
nRBC: 0 % (ref 0.0–0.2)

## 2019-12-20 MED ORDER — FUROSEMIDE 20 MG PO TABS
20.0000 mg | ORAL_TABLET | Freq: Every day | ORAL | Status: DC
  Administered 2019-12-20 – 2019-12-24 (×5): 20 mg via ORAL
  Filled 2019-12-20 (×5): qty 1

## 2019-12-20 NOTE — Progress Notes (Signed)
[place] PHYSICAL MEDICINE & REHABILITATION PROGRESS NOTE   Subjective/Complaints: Patient seen sitting up in her chair working with therapy this morning.  Later in the day working with therapies as well.  She states she slept better overnight.  She notes her anxiety is at baseline, but she is adjusting to being in the hospital.  She notes her feet are sore due to edema.  ROS: Denies CP, SOB, N/V/D  Objective:   No results found. Recent Labs    12/19/19 0734 12/20/19 0603  WBC 17.5* 14.9*  HGB 9.8* 8.9*  HCT 30.3* 27.8*  PLT 314 263   Recent Labs    12/18/19 0605 12/19/19 0734  NA 137 137  K 4.3 4.1  CL 100 98  CO2 27 29  GLUCOSE 146* 182*  BUN 10 10  CREATININE 0.91 0.89  CALCIUM 7.9* 7.9*    Intake/Output Summary (Last 24 hours) at 12/20/2019 0272 Last data filed at 12/20/2019 0837 Gross per 24 hour  Intake 674 ml  Output --  Net 674 ml     Physical Exam: Vital Signs Blood pressure 113/60, pulse 94, temperature 98.6 F (37 C), resp. rate 16, height 4\' 11"  (1.499 m), weight 76 kg, SpO2 97 %. Constitutional: No distress . Vital signs reviewed. HENT: Normocephalic.  Atraumatic. Eyes: EOMI. No discharge. Cardiovascular: No JVD. Respiratory: Normal effort.  No stridor. Tachycardia. GI: Non-distended. Skin: Right lower extremity incision CDI Psych: Normal mood.  Normal behavior. Musc: Bilateral lower extremity edema Neuro: Alert Motor: B/l UE 5/5 RLE: 3/5 proximally, 4/5 ADF (pain inhibition), improving LLE: 4/5 proximal to distal  Assessment/Plan: 1. Functional deficits secondary to PAD s/p right FPBG which require 3+ hours per day of interdisciplinary therapy in a comprehensive inpatient rehab setting.  Physiatrist is providing close team supervision and 24 hour management of active medical problems listed below.  Physiatrist and rehab team continue to assess barriers to discharge/monitor patient progress toward functional and medical goals  Care  Tool:  Bathing    Body parts bathed by patient: Right arm, Left arm, Chest, Abdomen, Front perineal area, Buttocks, Right upper leg, Left upper leg, Face   Body parts bathed by helper: Right lower leg, Left lower leg     Bathing assist Assist Level: Minimal Assistance - Patient > 75%     Upper Body Dressing/Undressing Upper body dressing Upper body dressing/undressing activity did not occur (including orthotics): N/A What is the patient wearing?: Pull over shirt    Upper body assist Assist Level: Set up assist    Lower Body Dressing/Undressing Lower body dressing      What is the patient wearing?: Pants     Lower body assist Assist for lower body dressing: Moderate Assistance - Patient 50 - 74%     Toileting Toileting    Toileting assist Assist for toileting: Minimal Assistance - Patient > 75%     Transfers Chair/bed transfer  Transfers assist     Chair/bed transfer assist level: Contact Guard/Touching assist     Locomotion Ambulation   Ambulation assist      Assist level: Contact Guard/Touching assist Assistive device: Walker-rolling Max distance: 65'   Walk 10 feet activity   Assist     Assist level: Contact Guard/Touching assist Assistive device: Walker-rolling   Walk 50 feet activity   Assist    Assist level: Contact Guard/Touching assist Assistive device: Walker-rolling    Walk 150 feet activity   Assist Walk 150 feet activity did not occur: Safety/medical concerns  Assist level: Contact Guard/Touching assist Assistive device: Walker-rolling    Walk 10 feet on uneven surface  activity   Assist Walk 10 feet on uneven surfaces activity did not occur: Safety/medical concerns   Assist level: Contact Guard/Touching assist Assistive device: Aeronautical engineer Will patient use wheelchair at discharge?: No Type of Wheelchair: Manual    Wheelchair assist level: Supervision/Verbal cueing Max  wheelchair distance: 75ft    Wheelchair 50 feet with 2 turns activity    Assist            Wheelchair 150 feet activity     Assist          Blood pressure 113/60, pulse 94, temperature 98.6 F (37 C), resp. rate 16, height 4\' 11"  (1.499 m), weight 76 kg, SpO2 97 %.  Medical Problem List and Plan: 1.  Decreased functional mobility secondary to PVD status post redo right femoral to below-knee popliteal bypass as well as left common femoral enterectomy 12/09/2019.  Follow-up vascular surgery Dr. Oneida Alar  Continue CIR 2.  Antithrombotics: -DVT/anticoagulation: Eliquis             -antiplatelet therapy: Aspirin 81 mg daily 3.  Failed back syndrome/pain Management:  Oxycodone as needed.   Controlled with meds 8/27             Monitor with increased exertion 4. Mood: Provide emotional support             -antipsychotic agents: N/A  Klonopin for anxiety  Robitussin PRN.   Lavender drops on forehead at night and chamomile tea with dinner.   Anxiety at/near baseline. 5. Neuropsych: This patient is capable of making decisions on her own behalf. 6. Skin/Wound Care: Routine skin checks  -place wash rag in right groin to help with skin contact 7. Fluids/Electrolytes/Nutrition: Routine in and outs.  8.  Acute blood loss anemia.    Hgb 8.9 on 8/27, labs ordered for Monday  Continue to monitor 9.  Rectal bleeding .  Follow-up GI.  CT of abdomen consistent with ischemic colitis.            See #8             Ancef completed. Last dose flagyl on 8/22 10.  Atrial fibrillation RVR.  Follow-up cardiology services..              Metoprolol to 75mg  daily   Slightly elevated on 8/27, will consider further medication adjustments if persistent Filed Weights   12/16/19 0500 12/18/19 0346 12/19/19 0418  Weight: 81.3 kg 78.5 kg 76 kg  11.  GERD.  Protonix 12.  COPD.  Check oxygen saturations every shift 13.  Hyperlipidemia. Lipitor 14.  Trigeminal neuralgia.  Maintained on Lamictal 100  mg twice daily per neurology services 15. Loose stool: d/t abx  -abx completed   -add probiotic, prn imodium  Fiber added on 8/25  Appears to be improving 16. Leukocytosis  WBCs 14.9 on 8/27    UA equivocal, urine culture pending.  Empiric Keflex started.   Afebrile 17. Peripheral edema  Lasix restarted on 8/27  Periwick ordered qhs   LOS: 7 days A FACE TO FACE EVALUATION WAS PERFORMED  Essynce Munsch Lorie Phenix 12/20/2019, 9:22 AM

## 2019-12-20 NOTE — Progress Notes (Signed)
Occupational Therapy Session Note  Patient Details  Name: [name] [name] MRN: [id number] Date of Birth: 20-Sep-1924  Today's Date: 12/20/2019 OT Individual Time: 1300-1400 OT Individual Time Calculation (min): 60 min    Short Term Goals: Week 1:  OT Short Term Goal 1 (Week 1): STG=LTG  Skilled Therapeutic Interventions/Progress Updates:    Treatment session with focus on functional mobility and LB dressing.  Pt received supine in bed reporting fatigue but willing to engage in therapy session in room.  Pt completed bed mobility Mod I from flat bed.  Adjusted bed to 27.25" height to simulate home bed height with pt able to complete sit > stand Supervision and then able to complete bed mobility back in to bed with CGA due to height of bed and pt short stature.  Discussed removing 3" risers to allow for improved safety with sit <> stand from bed, son reports they will take out risers.  Pt completed bed mobility from 24.25" bed height with improved ease and increased safety, everyone in agreement to remove risers initially.  Engaged in [place] from w/c completing various techniques to increase independence, pt ultimately more independent with placing feet in to pants while pants on floor and then able to reach forward to pull pants over legs and then stand to pull over hips.  Pt completed with close supervision at sit > stand level with RW.  Pt donned socks with increased time and min assist with use of sock aid.  Pt reports feeling like she is "gasping" for air after LB dressing tasks.  O2 sats initially 83% on room air, with cues for breathing technique pt able to recover sats to mid 90s on room air and remained on room air in mid 90s remainder of session.  Returned to bed and remained in sidelying with all needs in reach.  Therapy Documentation Precautions:  Precautions Precautions: Fall Precaution Comments: watch HR  Restrictions Weight Bearing Restrictions: No General:   Vital  Signs: Therapy Vitals Temp: 97.6 F (36.4 C) Pulse Rate: 85 Resp: 16 BP: 132/76 Patient Position (if appropriate): Sitting Oxygen Therapy SpO2: 97 % O2 Device: Room Air Pain:  Pt with c/o pain in B feet with WB.  Repositioned and rest breaks   Therapy/Group: Individual Therapy  Simonne Come 12/20/2019, 3:20 PM

## 2019-12-20 NOTE — Progress Notes (Signed)
[facility]  Subjective  - feels ok   Objective 132/76 85 97.6 F (36.4 C) 16 97%  Intake/Output Summary (Last 24 hours) at 12/20/2019 1619 Last data filed at 12/20/2019 1235 Gross per 24 hour  Intake 638 ml  Output --  Net 638 ml   Some maceration of left groin incision but overall healing Less edema Feet warm  Assessment/Planning: POD #11 overall improving scheduled for d/c next week Will arrange follow up appt  [name] 12/20/2019 4:19 PM --  Laboratory Lab Results: Recent Labs    12/19/19 0734 12/20/19 0603  WBC 17.5* 14.9*  HGB 9.8* 8.9*  HCT 30.3* 27.8*  PLT 314 263   BMET Recent Labs    12/18/19 0605 12/19/19 0734  NA 137 137  K 4.3 4.1  CL 100 98  CO2 27 29  GLUCOSE 146* 182*  BUN 10 10  CREATININE 0.91 0.89  CALCIUM 7.9* 7.9*    COAG Lab Results  Component Value Date   INR 1.0 12/09/2019   INR 1.2 11/29/2019   INR 1.8 (A) 11/21/2019   No results found for: PTT

## 2019-12-20 NOTE — Progress Notes (Signed)
Physical Therapy Session Note  Patient Details  Name: MAGHEN GROUP MRN: [id number] Date of Birth: 1924-07-24  Today's Date: 12/20/2019 PT Individual Time: 0930-1044 PT Individual Time Calculation (min): 74 min   Short Term Goals: Week 1:  PT Short Term Goal 1 (Week 1): STGs=LTGs due to ELOS  Skilled Therapeutic Interventions/Progress Updates:    Pt received supine in bed, agreeable to PT session. Pt reports minimal B feet pain contributed to her swelling. Encouraged ankle pumps and elevating feet as tolerated to aid in edema management. Performed supine<>sit with CGA with HOB slightly elevated, ambulated from bed to bathroom with CGA and RW where she was continent of voiding, performed pericare indep. Pt transported from room to day room gym [place] in w/c for time management. Pt then performed dynamic standing balance corn hole game without BUE support and CGA. Practiced reaching with RUE to grab bags in multiple panes (forward, upward, lateral, and midline). Pt with improvement in RUE coordination with game, scored 7 points first trial and 12 points 2nd trial. Pt performed 56min30 seconds of standing toe taps on 4inch platform with therapist providing HHA/minA, focusing on endurance training. Pt with increased work of breathing after completion, reporting 7/10 RPE. Performed 2nd trial lasting 7min 40 seconds of standing toe taps on 4inch platform, reports 8/10 RPE after completion. Seated rest breaks provided for recovery. Pt then performed the following activities while standing unsupported: 2x10 arm raises with basketball, 2x10 thoracic rotations with basketball, and 2x10 forward reach's with basketball, therapist providing CGA/minA due to posterior lean in standing. Pt reports 10/10 RPE after completion of these. After seated rest, pt ambulated 16ft with supervision and RW from day room back towards her room, transported remaining distance in her w/c. Stand<>pivot with CGA and no AD back to bed,  sit>supine with supervision. Ended session with 3/4 bedrails up, bed alarm on, needs in reach, son at bedside.   Therapy Documentation Precautions:  Precautions Precautions: Fall Precaution Comments: watch HR  Restrictions Weight Bearing Restrictions: No Vital Signs: Therapy Vitals Temp: 98.6 F (37 C) Pulse Rate: 94 Resp: 16 BP: 113/60 Patient Position (if appropriate): Lying Oxygen Therapy SpO2: 97 % O2 Device: Room Air Pain: Pain Assessment Pain Scale: 0-10 Pain Score: 0-No pain  Therapy/Group: Individual Therapy  Corbyn Steedman P Shayann Garbutt PT 12/20/2019, 9:28 AM

## 2019-12-20 NOTE — Progress Notes (Signed)
Physical Therapy Session Note  Patient Details  Name: [name] [name] MRN: [id number] Date of Birth: 31-Dec-1924  Today's Date: 12/20/2019 PT Individual Time: 0800-0858 PT Individual Time Calculation (min): 58 min   Short Term Goals: Week 1:  PT Short Term Goal 1 (Week 1): STGs=LTGs due to ELOS  Skilled Therapeutic Interventions/Progress Updates:    Patient received supine in bed, agreeable to PT. She denies pain at rest, but does report discomfort in B plantar surfaces of feet when standing/walking. She declines pain interventions at this time. Patient able to achieve sitting EOB ModI with use of bed rails and ambulate to bathroom with FWW + SBA. Patient TotalA for clothing management, but SPV for pericare in standing. She was able to complete morning ADLs seated in wc with set up assist. Patient able to propel wc ~45ft before requiring rest break. She demonstrated poor endurance throughout therapy sessions requiring multiple extended rest breaks and verbal cues for PLB for breath management. Patient able to negotiate 4" curb with FWW + CGA and verbal cues for process. Patient educated on proper sequence of curb negotiation leading with L LE when ascending and R LE when descending. Patient will benefit from further reinforcement of this task prior to dc home. 5XSTS completed x2 with B UE support on armrests for standing: 31.33", 27.74" both times indicating increased risk for falling and limited power production in B LE. Patient able to complete 2x2 mins on Nustep maintaining RPE <13/14. She reports SOB after each bout and required ~6min to recover. Patient returned to bed with bed alarm on and call light within reach.   Therapy Documentation Precautions:  Precautions Precautions: Fall Precaution Comments: watch HR  Restrictions Weight Bearing Restrictions: No   Therapy/Group: Individual Therapy  Karoline Caldwell, PT, DPT, CBIS 12/20/2019, 7:42 AM

## 2019-12-21 ENCOUNTER — Inpatient Hospital Stay (HOSPITAL_COMMUNITY): Payer: Medicare Other | Admitting: Physical Therapy

## 2019-12-21 ENCOUNTER — Inpatient Hospital Stay (HOSPITAL_COMMUNITY): Payer: Medicare Other

## 2019-12-21 MED ORDER — CLONAZEPAM 0.25 MG PO TBDP: 0.2500 mg | ORAL_TABLET | Freq: Every day | ORAL | Status: DC

## 2019-12-21 NOTE — Progress Notes (Signed)
Occupational Therapy Session Note  Patient Details  Name: [name] [name] MRN: [id number] Date of Birth: 11/30/1924  Today's Date: 12/21/2019 OT Individual Time: 1300-1400 OT Individual Time Calculation (min): 60 min    Short Term Goals: Week 1:  OT Short Term Goal 1 (Week 1): STG=LTG  Skilled Therapeutic Interventions/Progress Updates:    1;1. Pt received in bed agreeable to OT. Pt reporting no pain but needs to use the bathroom. tp completes ambulatory transfer to bathroom with RW with CGA and increased time to negotiate uphill threshold. Pt completes UB bathing, grooming and hand hygiene seated at sink with set up. Pt dons shirt with set up. Pt requires VC for pacing self. Ot provides A to wash hair and pt styles and dries with hair dryer for BUE endruance. Pt completes toileting 1 more time during session after OT dons teds with total A edu re bag donning technique. eixted session wihtpt seated in bed, exit alarm on an dcall light in reach  Therapy Documentation Precautions:  Precautions Precautions: Fall Precaution Comments: watch HR  Restrictions Weight Bearing Restrictions: No General:   Vital Signs: Therapy Vitals BP: 126/77 Pain: Pain Assessment Pain Scale: 0-10 Pain Score: 0-No pain ADL: ADL Eating: Independent Grooming: Setup Where Assessed-Grooming: Edge of bed Upper Body Bathing: Setup Lower Body Bathing: Moderate assistance Where Assessed-Lower Body Bathing: Edge of bed Upper Body Dressing: Setup Where Assessed-Upper Body Dressing: Edge of bed Lower Body Dressing: Maximal assistance Where Assessed-Lower Body Dressing: Edge of bed Toilet Transfer: Minimal assistance Social research officer, government Method: Unable to assess Vision   Perception    Praxis   Exercises:   Other Treatments:     Therapy/Group: Individual Therapy  Tonny Branch 12/21/2019, 1:01 PM

## 2019-12-21 NOTE — Progress Notes (Signed)
Physical Therapy Session Note  Patient Details  Name: [name] [name] MRN: [id number] Date of Birth: October 06, 1924  Today's Date: 12/21/2019 PT Individual Time: 0900-0950 PT Individual Time Calculation (min): 50 min   Short Term Goals: Week 1:  PT Short Term Goal 1 (Week 1): STGs=LTGs due to ELOS  Skilled Therapeutic Interventions/Progress Updates: Pt presented in w/c agreeable to therapy. Pt c/o pain in B feet but no intervention requested, rest breaks provided as needed throughout session. Pt transported to day room for energy conservation and time management. Pt ambulated in day room 23ft with seated rest and mild dyspnea noted. After brief break pt ambulated additional 69ft to high/low mat. Pt participated in toe taps to 4in step 2 x 10 with RW and supervision. PTA initiated HEP seated and standing as follows: LAQ, hip flexion, standing hip abd/add, standing hamstring pulls, and STS. Pt demonstrated good tolerance to all activities. Pt then indicated need for bathroom. Performed stand pivot to w/c with RW and close S and transported back to room. Pt performed ambulatory transfer to toilet from w/c and required minA for doffing pants due to urgency. Pt left at toilet with hand off to nsg to administer am meds.   Access Code: [email or web address] URL: [email or web address] Date: 12/21/2019 Prepared by: Karlyne Greenspan Gerene Nedd  Exercises Seated Long Arc Quad - 1 x daily - 7 x weekly - 3 sets - 10 reps Seated March - 1 x daily - 7 x weekly - 3 sets - 10 reps Standing Hip Abduction with Counter Support - 1 x daily - 7 x weekly - 3 sets - 10 reps Standing Hamstring Curl with Chair Support - 1 x daily - 7 x weekly - 3 sets - 10 reps Sit to Stand with Armchair - 1 x daily - 7 x weekly - 3 sets - 5 reps       Therapy Documentation Precautions:  Precautions Precautions: Fall Precaution Comments: watch HR  Restrictions Weight Bearing Restrictions: No    Therapy/Group: Individual  Therapy  Laynie Espy 12/21/2019, 3:32 PM

## 2019-12-21 NOTE — Progress Notes (Signed)
[place] PHYSICAL MEDICINE & REHABILITATION PROGRESS NOTE   Subjective/Complaints: Feels much better.  Claustrophobia at night still. Discussed scheduling klonopin and she is agreeable UC not drawn yesterday; discussed with RN  ROS: Denies CP, SOB, N/V/D  Objective:   No results found. Recent Labs    12/19/19 0734 12/20/19 0603  WBC 17.5* 14.9*  HGB 9.8* 8.9*  HCT 30.3* 27.8*  PLT 314 263   Recent Labs    12/19/19 0734  NA 137  K 4.1  CL 98  CO2 29  GLUCOSE 182*  BUN 10  CREATININE 0.89  CALCIUM 7.9*    Intake/Output Summary (Last 24 hours) at 12/21/2019 1423 Last data filed at 12/20/2019 1745 Gross per 24 hour  Intake 200 ml  Output --  Net 200 ml     Physical Exam: Vital Signs Blood pressure 126/77, pulse 83, temperature 98.3 F (36.8 C), temperature source Oral, resp. rate 16, height 4\' 11"  (1.499 m), weight 75.5 kg, SpO2 95 %. General: Alert and oriented x 3, No apparent distress HEENT: Head is normocephalic, atraumatic, PERRLA, EOMI, sclera anicteric, oral mucosa pink and moist, dentition intact, ext ear canals clear,  Neck: Supple without JVD or lymphadenopathy Heart: Reg rate and rhythm. No murmurs rubs or gallops Respiratory: Normal effort.  No stridor. Tachycardia. GI: Non-distended. Skin: Right lower extremity incision CDI Psych: Normal mood.  Normal behavior. Musc: Bilateral lower extremity edema Neuro: Alert Motor: B/l UE 5/5 RLE: 3/5 proximally, 4/5 ADF (pain inhibition), improving LLE: 4/5 proximal to distal    Assessment/Plan: 1. Functional deficits secondary to PAD s/p right FPBG which require 3+ hours per day of interdisciplinary therapy in a comprehensive inpatient rehab setting.  Physiatrist is providing close team supervision and 24 hour management of active medical problems listed below.  Physiatrist and rehab team continue to assess barriers to discharge/monitor patient progress toward functional and medical goals  Care  Tool:  Bathing    Body parts bathed by patient: Right arm, Left arm, Chest, Abdomen, Front perineal area, Buttocks, Right upper leg, Left upper leg, Face   Body parts bathed by helper: Right lower leg, Left lower leg     Bathing assist Assist Level: Minimal Assistance - Patient > 75%     Upper Body Dressing/Undressing Upper body dressing Upper body dressing/undressing activity did not occur (including orthotics): N/A What is the patient wearing?: Pull over shirt    Upper body assist Assist Level: Set up assist    Lower Body Dressing/Undressing Lower body dressing      What is the patient wearing?: Pants     Lower body assist Assist for lower body dressing: Contact Guard/Touching assist     Toileting Toileting    Toileting assist Assist for toileting: Minimal Assistance - Patient > 75%     Transfers Chair/bed transfer  Transfers assist     Chair/bed transfer assist level: Supervision/Verbal cueing     Locomotion Ambulation   Ambulation assist      Assist level: Contact Guard/Touching assist Assistive device: Walker-rolling Max distance: 12   Walk 10 feet activity   Assist     Assist level: Contact Guard/Touching assist Assistive device: Walker-rolling   Walk 50 feet activity   Assist    Assist level: Contact Guard/Touching assist Assistive device: Walker-rolling    Walk 150 feet activity   Assist Walk 150 feet activity did not occur: Safety/medical concerns  Assist level: Contact Guard/Touching assist Assistive device: Walker-rolling    Walk 10 feet on uneven  surface  activity   Assist Walk 10 feet on uneven surfaces activity did not occur: Safety/medical concerns   Assist level: Contact Guard/Touching assist Assistive device: Aeronautical engineer Will patient use wheelchair at discharge?: No Type of Wheelchair: Manual    Wheelchair assist level: Supervision/Verbal cueing Max wheelchair distance:  43ft    Wheelchair 50 feet with 2 turns activity    Assist            Wheelchair 150 feet activity     Assist          Blood pressure 126/77, pulse 83, temperature 98.3 F (36.8 C), temperature source Oral, resp. rate 16, height 4\' 11"  (1.499 m), weight 75.5 kg, SpO2 95 %.  Medical Problem List and Plan: 1.  Decreased functional mobility secondary to PVD status post redo right femoral to below-knee popliteal bypass as well as left common femoral enterectomy 12/09/2019.  Follow-up vascular surgery Dr. Oneida Alar  Continue CIR 2.  Antithrombotics: -DVT/anticoagulation: Eliquis             -antiplatelet therapy: Aspirin 81 mg daily 3.  Failed back syndrome/pain Management:  Oxycodone as needed.   Controlled with meds 8/28             Monitor with increased exertion 4. Mood: Provide emotional support             -antipsychotic agents: N/A  Klonopin for anxiety- scheduled HS  Robitussin PRN.   Lavender drops on forehead at night and chamomile tea with dinner.   Anxiety at/near baseline. 5. Neuropsych: This patient is capable of making decisions on her own behalf. 6. Skin/Wound Care: Routine skin checks  -place wash rag in right groin to help with skin contact  -appreciate vascular following.  7. Fluids/Electrolytes/Nutrition: Routine in and outs.  8.  Acute blood loss anemia.    Hgb 8.9 on 8/27, labs ordered for Monday  Continue to monitor 9.  Rectal bleeding .  Follow-up GI.  CT of abdomen consistent with ischemic colitis.            See #8             Ancef completed. Last dose flagyl on 8/22 10.  Atrial fibrillation RVR.  Follow-up cardiology services..              Metoprolol to 75mg  daily   Slightly elevated on 8/27, will consider further medication adjustments if persistent Filed Weights   12/19/19 0418 12/20/19 1706 12/21/19 0300  Weight: 76 kg 78.2 kg 75.5 kg  11.  GERD.  Protonix 12.  COPD.  Check oxygen saturations every shift 13.  Hyperlipidemia.  Lipitor 14.  Trigeminal neuralgia.  Maintained on Lamictal 100 mg twice daily per neurology services 15. Loose stool: d/t abx  -abx completed   -add probiotic, prn imodium  Fiber added on 8/25  Appears to be improving 16. Leukocytosis  WBCs 14.9 on 8/27   UA equivocal, urine culture pending.  Empiric Keflex started.   Afebrile 17. Peripheral edema  Lasix restarted on 8/27  Periwick ordered qhs   LOS: 8 days A FACE TO FACE EVALUATION WAS PERFORMED  Clide Deutscher Abdulrahim Siddiqi 12/21/2019, 2:23 PM

## 2019-12-22 ENCOUNTER — Encounter (HOSPITAL_COMMUNITY): Payer: Medicare Other | Admitting: Occupational Therapy

## 2019-12-22 ENCOUNTER — Inpatient Hospital Stay (HOSPITAL_COMMUNITY): Payer: Medicare Other

## 2019-12-22 LAB — URINE CULTURE

## 2019-12-22 MED ORDER — CLONAZEPAM 0.25 MG PO TBDP
0.2500 mg | ORAL_TABLET | Freq: Every evening | ORAL | Status: DC | PRN
  Administered 2019-12-23: 0.25 mg via ORAL
  Filled 2019-12-22: qty 1

## 2019-12-22 NOTE — Discharge Summary (Signed)
Physician Discharge Summary  Patient ID: SHELBYLYNN WALCZYK MRN: [id number] DOB/AGE: [id number] 84 y.o.  Admit date: 12/13/2019 Discharge date: 12/24/2019  Discharge Diagnoses:  Principal Problem:   PAOD (peripheral arterial occlusive disease) ([place]) Active Problems:   PAD (peripheral artery disease) ([facility])   Debility   Anxiety state   Peripheral edema   Leukocytosis   Loose stools   Acute lower UTI DVT prophylaxis Acute blood loss anemia Atrial fibrillation GERD COPD Hyperlipidemia Trigeminal neuralgia  Discharged Condition: Stable  Significant Diagnostic Studies: DG Chest 2 View  Result Date: 12/14/2019 CLINICAL DATA:  Shortness of breath EXAM: CHEST - 2 VIEW COMPARISON:  05/28/2018 FINDINGS: Frontal and lateral views of the chest demonstrate an unremarkable cardiac silhouette given AP positioning. There is blunting of the costophrenic angles posteriorly, consistent with bilateral lower lobe consolidation and small effusions. Favor atelectasis. No pneumothorax. No acute bony abnormalities. IMPRESSION: 1. Blunting of the posterior costophrenic angles consistent with bilateral lower lobe atelectasis and/or small effusions. Electronically Signed   By: Randa Ngo M.D.   On: 12/14/2019 19:13   CT ABDOMEN PELVIS W CONTRAST  Result Date: 12/10/2019 CLINICAL DATA:  86 year old female with history of acute abdominal pain. EXAM: CT ABDOMEN AND PELVIS WITH CONTRAST TECHNIQUE: Multidetector CT imaging of the abdomen and pelvis was performed using the standard protocol following bolus administration of intravenous contrast. CONTRAST:  27mL OMNIPAQUE IOHEXOL 300 MG/ML  SOLN COMPARISON:  No prior CT the abdomen and pelvis. Chest CT 10/01/2012. FINDINGS: Lower chest: Pulmonary nodules noted in the right lung, stable dating back to 2014, considered definitively benign. Atherosclerotic calcifications in the descending thoracic aorta as well as the left main, left anterior descending, left circumflex  and right coronary arteries. Mild thickening calcification of the aortic valve. Severe calcifications of the mitral annulus. Hepatobiliary: No discrete cystic or solid hepatic lesions. No intra or extrahepatic biliary ductal dilatation. Status post cholecystectomy. Pancreas: No pancreatic mass. No pancreatic ductal dilatation. No pancreatic or peripancreatic fluid collections or inflammatory changes. Spleen: Unremarkable. Adrenals/Urinary Tract: Multiple low-attenuation lesions are noted in both kidneys compatible with simple cysts, largest of which is in the upper pole the left kidney measuring 8.2 x 7.5 cm. In addition, in the lower pole of the left kidney (axial image 45 of series 3 and coronal image 49 of series 7) there is a 3.0 x 2.3 x 3.1 cm low-attenuation lesion which has some internal septations which appear to demonstrates some low-level internal enhancement. Multiple other subcentimeter low-attenuation lesions in both kidneys are too small to definitively characterize, but are statistically likely to represent tiny cysts. No hydroureteronephrosis. Urinary bladder is remarkable for a small amount of gas non dependently within the lumen. Urinary bladder is otherwise unremarkable. Bilateral adrenal glands are normal in appearance. Stomach/Bowel: The appearance of the stomach is normal. No pathologic dilatation of small bowel or colon. Extensive mural thickening and mucosal hyperenhancement is noted in the colon, involving the distal transverse colon, splenic flexure, descending colon and sigmoid colon, concerning for colitis. Several colonic diverticulae are also noted, without discrete surrounding inflammatory changes to clearly indicate an acute diverticulitis at this time. Normal appendix. Vascular/Lymphatic: Aortic atherosclerosis, without evidence of aneurysm or dissection in the abdominal or pelvic vasculature. No lymphadenopathy noted in the abdomen or pelvis. Reproductive: Densely calcified lesion  measuring 2.4 cm in the right-side of the uterine body, presumably a chronic calcified fibroid. Ovaries are unremarkable in appearance. Other: Trace volume of ascites.  No pneumoperitoneum. Musculoskeletal: There are no aggressive appearing lytic  or blastic lesions noted in the visualized portions of the skeleton. Multiple old healed posterior left-sided rib fractures. Status post PLIF at L4-L5 with interbody graft at L4-L5 interspace. IMPRESSION: 1. Imaging findings of acute colitis involving the distal transverse colon, splenic flexure, descending colon and sigmoid colon. 2. Colonic diverticulosis without definitive evidence to suggest an acute diverticulitis at this time. 3. Gas non dependently in the lumen of the urinary bladder. If there has been recent catheterization for urinalysis, this is presumably iatrogenic. In the absence of a catheterization, further evaluation with urinalysis would be recommended to exclude the possibility of infection with gas-forming organisms. 4. Multiple low-attenuation lesions in the kidneys bilaterally, most of which appear to represent simple cysts. In addition, however, there is an indeterminate lesion in the lower pole of the left kidney which warrants further evaluation. Follow-up evaluation with nonemergent abdominal MRI with and without IV gadolinium is recommended in the near future to exclude neoplasm. 5. Aortic atherosclerosis, in addition to left main and 3 vessel coronary artery disease. 6. There are calcifications of the aortic valve and mitral annulus. Echocardiographic correlation for evaluation of potential valvular dysfunction may be warranted if clinically indicated. 7. Additional incidental findings, as above. Electronically Signed   By: [name] M.D.   On: 12/10/2019 15:03   PERIPHERAL VASCULAR CATHETERIZATION  Result Date: 11/29/2019 Procedure: Abdominal aortogram with bilateral lower extremity runoff Preoperative diagnosis: Rest pain right foot  Postoperative diagnosis: Same Anesthesia: Local with sedation Operative findings: 1.  90% stenosis left common femoral artery above femoral below-knee popliteal bypass two-vessel runoff left foot anterior tibial dominant 2.  Occlusion right femoral to below-knee popliteal bypass and native right superficial femoral and popliteal arteries. 3.  Patent right below-knee popliteal artery with one-vessel peroneal runoff Operative details: After team informed consent, the patient taken the [place] lab.  The patient was placed in supine position angio table.  Both groins were prepped and draped in usual sterile fashion.  Ultrasound was used to examine the patient's left groin.  I could not find a reasonable window that appeared to have a good lumen within it and identify the left femoral-popliteal bypass.  Therefore I decided to stick the right groin.  Ultrasound was used to identify the right common femoral artery and femoral bifurcation.  Using ultrasound guidance after local anesthetic infiltration I was able to successfully cannulate the right common femoral artery and advance an 035 Rosen wire into the abdominal aorta.  A 6 French dilator was then placed over this to dilate up the tract through pre-existing scar.  5 French sheath was then placed over the guidewire and thoroughly flushed with heparinized saline.  5 French pigtail catheter was then advanced into the abdominal aorta and abdominal aortogram obtained in AP projection.  Left and right renal arteries are patent.  Infrarenal abdominal aorta is patent.  The left and right common external and internal iliac arteries are all patent.  Next pigtail catheter was pulled down above the aortic bifurcation and bilateral oblique views of the pelvis were obtained with magnification.  This confirmed the above findings.  Additionally there is a 90% stenosis in the mid left common femoral artery above the level of her bypass.  On the right side the right common femoral is patent.   The right profundus patent.  The native right superficial femoral artery occludes at its origin. Next bilateral lower extremity runoff views were obtained through the pigtail catheter. In the right lower extremity, the right common femoral and  profunda is patent.  The right femoropopliteal bypass is occluded throughout its course.  The native right superficial femoral artery above-knee popliteal and most of the below-knee popliteal artery is occluded.  The distal below-knee popliteal artery does reconstitute and gives off one-vessel runoff to the peroneal. In the left lower extremity, the left common femoral artery is patent but there is a stenosis in the midportion about 90% which is just above the level of an existing femoral to below-knee popliteal bypass.  The profunda is patent.  The femoral to below-knee popliteal bypass is patent with two-vessel runoff of the left foot dominant AT runoff.  The native left superficial femoral and popliteal arteries are occluded. At this point the 5 French pigtail catheter was removed over guidewire.  The 5 French sheath was thoroughly flushed with heparinized saline.  The sheath was left in place to be pulled in the holding area. Operative management: The patient will be scheduled on December 09, 2019 for redo right femoral to below-knee popliteal bypass with PTFE.  Additionally we will do a left common femoral endarterectomy to salvage the left femoropopliteal bypass at the same operation. Patient was updated on the plan and I discussed this with her daughter by phone. The patient will be admitted tonight for 23-hour observation since she does not have anyone at home with her [place], MD [facility] Office: [phone]  VAS [place] ABI WITH/WO TBI  Result Date: 12/11/2019 LOWER EXTREMITY DOPPLER STUDY Indications: Post Op.  Vascular Interventions: Right Fem-pop bypass, Left Fem endart. Limitations: Today's exam was limited due to  difficult evaluation. Performing Technologist: Lita Mains RDMS, RVT  Examination Guidelines: A complete evaluation includes at minimum, Doppler waveform signals and systolic blood pressure reading at the level of bilateral brachial, anterior tibial, and posterior tibial arteries, when vessel segments are accessible. Bilateral testing is considered an integral part of a complete examination. Photoelectric Plethysmograph (PPG) waveforms and toe systolic pressure readings are included as required and additional duplex testing as needed. Limited examinations for reoccurring indications may be performed as noted.  ABI Findings: +---------+------------------+-----+--------+---------------+ Right    Rt Pressure (mmHg)IndexWaveformComment         +---------+------------------+-----+--------+---------------+ Brachial 110                                            +---------+------------------+-----+--------+---------------+ PTA      255               2.32         noncompressible +---------+------------------+-----+--------+---------------+ DP       82                0.75                         +---------+------------------+-----+--------+---------------+ Great Toe43                0.39 Normal                  +---------+------------------+-----+--------+---------------+ +---------+------------------+-----+--------+----------------------------------+ Left     Lt Pressure (mmHg)IndexWaveformComment                            +---------+------------------+-----+--------+----------------------------------+ Brachial  unable to obtain, per RN 107       +---------+------------------+-----+--------+----------------------------------+ PTA      84                0.76         unreliable, audible only, no                                               cooresponding image                 +---------+------------------+-----+--------+----------------------------------+ DP       114               1.04         unreliable, audible only, no                                               cooresponding image                +---------+------------------+-----+--------+----------------------------------+ Great Toe16                0.15 Abnormal                                   +---------+------------------+-----+--------+----------------------------------+ +-------+-----------+-----------+------------+------------+ ABI/TBIToday's ABIToday's TBIPrevious ABIPrevious TBI +-------+-----------+-----------+------------+------------+ Right  .75                   .34         0            +-------+-----------+-----------+------------+------------+ Left   1.0                   .51                      +-------+-----------+-----------+------------+------------+ TOES Findings: +----------+---------------+--------+-------+ Right ToesPressure (mmHg)WaveformComment +----------+---------------+--------+-------+ 1st Digit 43                             +----------+---------------+--------+-------+  +---------+---------------+--------+-------+ Left ToesPressure (mmHg)WaveformComment +---------+---------------+--------+-------+ 1st Digit16                             +---------+---------------+--------+-------+   Right ABIs appear increased compared to prior study on 11/18/19.  Summary: Right: Resting right ankle-brachial index indicates moderate right lower extremity arterial disease. The right toe-brachial index is normal. Left: The left toe-brachial index is abnormal. Difficult evaluation of left lower extremity. Audible signal, unable to document (especially PT).  *See table(s) above for measurements and observations.  Electronically signed by Deitra Mayo MD on 12/11/2019 at 5:28:18 AM.    Final    ECHOCARDIOGRAM COMPLETE  Result Date: 11/27/2019     ECHOCARDIOGRAM REPORT   Patient Name:   JANIQUA FRISCIA Date of Exam: 11/27/2019 Medical Rec #:  [id number]        Height:       59.0 in Accession #:    [id number]       Weight:       165.0 lb Date of Birth:  04/21/1925        BSA:  1.700 m Patient Age:    73 years         BP:           125/77 mmHg Patient Gender: F                HR:           111 bpm. Exam Location:  [address] Procedure: 2D Echo, Cardiac Doppler and Color Doppler Indications:    I48.91 Atrial fibrillation                 Pre operative clearance.  History:        Patient has prior history of Echocardiogram examinations, most                 recent 01/09/2012. COPD and PAD; Risk Factors:Hypertension and                 Dyslipidemia.  Sonographer:    Lenard Galloway BA, RDCS Referring Phys: [phone] [facility] [place] IMPRESSIONS  1. Left ventricular ejection fraction, by estimation, is 60 to 65%. The left ventricle has normal function. The left ventricle has no regional wall motion abnormalities. The left ventricular internal cavity size was mildly dilated. Left ventricular diastolic function could not be evaluated.  2. Right ventricular systolic function is normal. The right ventricular size is normal. There is mildly elevated pulmonary artery systolic pressure.  3. Left atrial size was mildly dilated.  4. The mitral valve is normal in structure. No evidence of mitral valve regurgitation. No evidence of mitral stenosis.  5. The aortic valve is grossly normal. Aortic valve regurgitation is not visualized. No aortic stenosis is present. FINDINGS  Left Ventricle: Left ventricular ejection fraction, by estimation, is 60 to 65%. The left ventricle has normal function. The left ventricle has no regional wall motion abnormalities. The left ventricular internal cavity size was mildly dilated. There is  no left ventricular hypertrophy. Left ventricular diastolic function could not be evaluated due to atrial fibrillation. Left ventricular diastolic  function could not be evaluated. Right Ventricle: The right ventricular size is normal. No increase in right ventricular wall thickness. Right ventricular systolic function is normal. There is mildly elevated pulmonary artery systolic pressure. The tricuspid regurgitant velocity is 2.65  m/s, and with an assumed right atrial pressure of 10 mmHg, the estimated right ventricular systolic pressure is 45.4 mmHg. Left Atrium: Left atrial size was mildly dilated. Right Atrium: Right atrial size was normal in size. Pericardium: Trivial pericardial effusion is present. Mitral Valve: The mitral valve is normal in structure. No evidence of mitral valve regurgitation. No evidence of mitral valve stenosis. Tricuspid Valve: The tricuspid valve is normal in structure. Tricuspid valve regurgitation is trivial. No evidence of tricuspid stenosis. Aortic Valve: The aortic valve is grossly normal. Aortic valve regurgitation is not visualized. No aortic stenosis is present. Pulmonic Valve: The pulmonic valve was normal in structure. Pulmonic valve regurgitation is not visualized. No evidence of pulmonic stenosis. Aorta: The aortic root and ascending aorta are structurally normal, with no evidence of dilitation. IAS/Shunts: The atrial septum is grossly normal.  LEFT VENTRICLE PLAX 2D LVIDd:         4.20 cm  Diastology LVIDs:         3.00 cm  LV e' lateral: 6.09 cm/s LV PW:         0.90 cm  LV e' medial:  5.66 cm/s LV IVS:        1.10 cm LVOT diam:  1.70 cm LV SV:         32 LV SV Index:   19 LVOT Area:     2.27 cm  RIGHT VENTRICLE RV Basal diam:  2.70 cm RV S prime:     7.80 cm/s TAPSE (M-mode): 1.7 cm LEFT ATRIUM             Index LA diam:        4.50 cm 2.65 cm/m LA Vol (A2C):   67.7 ml 39.83 ml/m LA Vol (A4C):   53.3 ml 31.36 ml/m LA Biplane Vol: 61.1 ml 35.95 ml/m  AORTIC VALVE LVOT Vmax:   77.43 cm/s LVOT Vmean:  54.833 cm/s LVOT VTI:    0.139 m  AORTA Ao Root diam: 3.35 cm Ao Asc diam:  3.40 cm TRICUSPID VALVE TR Peak  grad:   28.1 mmHg TR Vmax:        265.00 cm/s  SHUNTS Systemic VTI:  0.14 m Systemic Diam: 1.70 cm Mertie Moores MD Electronically signed by Mertie Moores MD Signature Date/Time: 11/27/2019/4:52:21 PM    Final    VAS [place] LOWER EXTREMITY ARTERIAL DUPLEX  Result Date: 12/12/2019 LOWER EXTREMITY ARTERIAL DUPLEX STUDY Indications: Check patency of graft - unreliable ABI. High Risk Factors: Hypertension.  Vascular Interventions: Right redo fem-pop bypass graft and left endarterectomy                         common femoral on 12/09/2019. History of left fem-pop                         bypass graft in 2006. Current ABI:            Right 0.75, left 1.0 on 12/10/19. Performing Technologist: Oda Cogan RDMS, RVT  Examination Guidelines: A complete evaluation includes B-mode imaging, spectral Doppler, color Doppler, and power Doppler as needed of all accessible portions of each vessel. Bilateral testing is considered an integral part of a complete examination. Limited examinations for reoccurring indications may be performed as noted.  +-----------+--------+-----+--------+--------+--------+ RIGHT      PSV cm/sRatioStenosisWaveformComments +-----------+--------+-----+--------+--------+--------+ ATA Distal 120                                   +-----------+--------+-----+--------+--------+--------+ PTA Distal 54                                    +-----------+--------+-----+--------+--------+--------+ PERO Distal69                                    +-----------+--------+-----+--------+--------+--------+  Right Graft #1: Fem-pop +------------------+--------+--------+----------+--------+                   PSV cm/sStenosisWaveform  Comments +------------------+--------+--------+----------+--------+ Inflow            122             biphasic           +------------------+--------+--------+----------+--------+ Prox Anastomosis  80              monophasic          +------------------+--------+--------+----------+--------+ Proximal Graft    54              monophasic         +------------------+--------+--------+----------+--------+  Mid Graft         54              monophasic         +------------------+--------+--------+----------+--------+ Distal Graft      64              monophasic         +------------------+--------+--------+----------+--------+ Distal Anastomosis56              monophasic         +------------------+--------+--------+----------+--------+ Outflow           86              monophasic         +------------------+--------+--------+----------+--------+   +-----------+--------+-----+--------+--------+--------+ LEFT       PSV cm/sRatioStenosisWaveformComments +-----------+--------+-----+--------+--------+--------+ POP Distal 67                                    +-----------+--------+-----+--------+--------+--------+ ATA Distal 90                                    +-----------+--------+-----+--------+--------+--------+ PTA Distal 31                                    +-----------+--------+-----+--------+--------+--------+ PERO Distal93                                    +-----------+--------+-----+--------+--------+--------+  Left Graft #1: +--------------------+--------+--------+--------+--------+                     PSV cm/sStenosisWaveformComments +--------------------+--------+--------+--------+--------+ Inflow              107             biphasic         +--------------------+--------+--------+--------+--------+ Proximal Anastomosis113             biphasic         +--------------------+--------+--------+--------+--------+ Proximal Graft      56              biphasic         +--------------------+--------+--------+--------+--------+ Mid Graft           79              biphasic         +--------------------+--------+--------+--------+--------+ Distal Graft         75              biphasic         +--------------------+--------+--------+--------+--------+ Distal Anastomosis  80              biphasic         +--------------------+--------+--------+--------+--------+ Outflow             67              biphasic         +--------------------+--------+--------+--------+--------+   Summary: Right: Patent right femoral-popliteal bypass graft without significant stenosis. Left: Patent left femoral-popliteal bypass graft without significant stenosis.  See table(s) above for measurements and observations. Electronically signed by Servando Snare MD on 12/12/2019 at 2:48:04 PM.    Final     Labs:  Basic Metabolic Panel:  Recent Labs  Lab 12/18/19 0605 12/19/19 0734 12/23/19 0507  NA 137 137 136  K 4.3 4.1 4.4  CL 100 98 99  CO2 27 29 26   GLUCOSE 146* 182* 120*  BUN 10 10 15   CREATININE 0.91 0.89 1.11*  CALCIUM 7.9* 7.9* 8.3*    CBC: Recent Labs  Lab 12/19/19 0734 12/20/19 0603 12/23/19 0507  WBC 17.5* 14.9* 15.0*  NEUTROABS 13.7* 11.4* 11.7*  HGB 9.8* 8.9* 9.1*  HCT 30.3* 27.8* 29.4*  MCV 92.9 92.7 93.0  PLT 314 263 284    CBG: No results for input(s): GLUCAP in the last 168 hours. Family history.  Mother with hypertension.  Father with CAD Sister with breast cancer son with hypertension.  Denies any  esophageal cancer or rectal cancer  Brief HPI:   MIAA LATTERELL is a 86 y.o. right-handed female with history of hypertension, hyperlipidemia, chronic back pain, PAF maintained on chronic Coumadin followed by cardiology service Dr. Oval Linsey, COPD as well as peripheral vascular disease.  Lives alone modified independent prior to admission.  She does have a son and daughter in the area.  About 4 to 6 weeks ago patient having difficulty with ambulation increasing rest pain to the right foot.  She had previously undergone a right femoral to above-knee popliteal bypass 2006 by [name] revised in 2010 as well as 1 prior thrombolysis  procedure to the right leg.  She had an arteriogram February 2020 that showed 80% narrowing of the midportion of her right femoral-popliteal bypass graft.  She also had 70% stenosis of her left common femoral artery.  She was admitted 12/09/2019 underwent redo right femoral to below-knee popliteal artery bypass as well as left common femoral endarterectomy pericardial patch 12/09/2019 per Dr. Oneida Alar.  Postoperatively patient did develop some painless rectal bleeding 12/10/2019 gastroenterology services consulted underwent CT of abdomen findings consistent with ischemic colitis close monitoring of hemoglobin 8.5 and remained initially on intravenous Ancef.  Hospital course further complicated by atrial fibrillation with RVR cardiology services consulted initially held for revascularization placed on intravenous heparin transition to Eliquis 12/13/2019 and heparin stopped.  Patient was admitted for a comprehensive rehab program   Hospital Course: DEKAYLA PRESTRIDGE was admitted to rehab 12/13/2019 for inpatient therapies to consist of PT, ST and OT at least three hours five days a week. Past admission physiatrist, therapy team and rehab RN have worked together to provide customized collaborative inpatient rehab.  Pertaining to patient's redo right femoral to below-knee popliteal bypass 12/09/2019 neurovascular sensation intact follow-up per Dr. Oneida Alar.  Patient remained on combination of Eliquis and aspirin for both DVT prophylaxis atrial fibrillation no other bleeding episodes.  Acute blood loss anemia latest hemoglobin 8.9.  No further bouts of rectal bleeding with recent CT of abdomen findings consistent with ischemic colitis she would follow-up with GI services as needed.  Patient remained on Protonix.  Noted history of trigeminal neuralgia maintained on Lamictal as per neurology services.  Mild leukocytosis 14,900 UA equivocal urine culture multiple species completed 5-day course of Keflex.  Patient with noted history  of atrial fibrillation followed by cardiology services cardiac rate controlled she remained on Toprol and would follow-up outpatient.   Blood pressures were monitored on TID basis and controlled     Rehab course: During patient's stay in rehab weekly team conferences were held to monitor patient's progress, set goals and discuss barriers to discharge. At admission, patient required moderate assist sit to stand moderate assist ambulate 3 feet rolling  walker.  Minimal assist upper body bathing max assist lower body bathing minimal assist upper dressing total assist lower body dressing  Physical exam.  Blood pressure 123/74 pulse 113 temperature 98 respirations 19 oxygen saturation 98% room air Constitutional.  No acute distress HEENT Head.  Normocephalic and atraumatic Eyes.  Pupils round and reactive to light no discharge without nystagmus Neck.  Supple nontender no JVD without thyromegaly Cardiac irregular irregular Abdomen.  Soft nontender positive bowel sounds without rebound Respiratory effort normal no respiratory distress without wheeze Musculoskeletal.  Generalized edema normal range of motion Skin.  Lower extremity revascularization site clean and dry Neurological alert oriented motor 4/5 throughout  /She  has had improvement in activity tolerance, balance, postural control as well as ability to compensate for deficits. Marlana Salvage has had improvement in functional use RUE/LUE  and RLE/LLE as well as improvement in awareness.  Ambulates 126 feet x 2 rolling walker supervision.  Up and down stairs rolling walker contact-guard assist.  Perform sit to stand contact-guard assist.  Emphasis placed on standing balance and activity tolerance.  His belongings for activities delivered and homemaking.  Full family teaching completed plan discharged home       Disposition: Discharge to home    Diet: Regular  Special Instructions: No driving smoking or alcohol  Medications at discharge 1.   Tylenol as needed 2.  Eliquis 5 mg p.o. twice daily 3.  Aspirin 81 mg p.o. daily 4.  Lipitor 40 mg p.o. daily 5.  Klonopin 0.25 mg p.o. nightly as needed anxiety 6.  Lasix 20 mg p.o. daily 7.  Lamictal 100 mg p.o. twice daily 8.  Melatonin 3 mg p.o. nightly as needed sleep 9.  Toprol-XL 75 mg p.o. daily 10.  Protonix 40 mg p.o. twice daily 11.  FiberCon 625 mg p.o. daily 12.  Florastor 250 mg p.o. twice daily  30-35 minutes were spent completing discharge summary and discharge planning  Discharge Instructions    Ambulatory referral to Physical Medicine Rehab   Complete by: As directed    Moderate complexity follow 1-2 weeks debility/back pain       Follow-up Information    Jamse Arn, MD Follow up.   Specialty: Physical Medicine and Rehabilitation Why: Only as directed Contact information: [place] [address] [phone]        Mauri Pole, MD Follow up.   Specialty: Gastroenterology Why: Call for appointment Contact information: [place] [address] [phone]        Elam Dutch, MD Follow up.   Specialties: Vascular Surgery, Cardiology Why: Call for appointment Contact information: [name] [address] [phone]        Skeet Latch, MD Follow up.   Specialty: Cardiology Why: call for appointment Contact information: [address] [name] [address] [phone]               Signed: Lavon Paganini [place] 12/24/2019, 5:20 AM

## 2019-12-22 NOTE — Progress Notes (Addendum)
Patient sitting at the side of the bed at around 3am with the NT. Patient says she's claustrophobic with the side rails up. RN and NT offered to bring a lounge chair for her to sit but she refused since it also has an arm rest. Patient agreed to lay back down in bed as long as side rails are down. Patient was assisted by rn to lay down on her side as requested and made comfortable. Patient said she has not slept well and requested not to wake her up if she falls asleep when breakfast comes. RN opened the blinds so she will feel less claustrophobic and pt. Has agreed. Call bell placed beside her pillow.

## 2019-12-22 NOTE — Progress Notes (Addendum)
[facility] PHYSICAL MEDICINE & REHABILITATION PROGRESS NOTE   Subjective/Complaints: No complaints Feeling better every day  ROS: Denies CP, SOB, N/V/D  Objective:   No results found. Recent Labs    12/20/19 0603  WBC 14.9*  HGB 8.9*  HCT 27.8*  PLT 263   No results for input(s): NA, K, CL, CO2, GLUCOSE, BUN, CREATININE, CALCIUM in the last 72 hours.  Intake/Output Summary (Last 24 hours) at 12/22/2019 1318 Last data filed at 12/22/2019 0700 Gross per 24 hour  Intake 476 ml  Output --  Net 476 ml     Physical Exam: Vital Signs Blood pressure 117/66, pulse 93, temperature 97.7 F (36.5 C), resp. rate 18, height 4\' 11"  (1.499 m), weight 74.3 kg, SpO2 98 %. General: Alert and oriented x 3, No apparent distress HEENT: Head is normocephalic, atraumatic, PERRLA, EOMI, sclera anicteric, oral mucosa pink and moist, dentition intact, ext ear canals clear,  Neck: Supple without JVD or lymphadenopathy Heart: Reg rate and rhythm. No murmurs rubs or gallops Respiratory: Normal effort.  No stridor. Tachycardia. GI: Non-distended. Skin: Right lower extremity incision CDI Psych: Normal mood.  Normal behavior. Musc: Bilateral lower extremity edema Neuro: Alert Motor: B/l UE 5/5 RLE: 3/5 proximally, 4/5 ADF (pain inhibition), improving LLE: 4/5 proximal to distal  Assessment/Plan: 1. Functional deficits secondary to PAD s/p right FPBG which require 3+ hours per day of interdisciplinary therapy in a comprehensive inpatient rehab setting.  Physiatrist is providing close team supervision and 24 hour management of active medical problems listed below.  Physiatrist and rehab team continue to assess barriers to discharge/monitor patient progress toward functional and medical goals  Care Tool:  Bathing    Body parts bathed by patient: Right arm, Left arm, Chest, Abdomen, Front perineal area, Buttocks, Right upper leg, Left upper leg, Face   Body parts bathed by helper: Right lower  leg, Left lower leg     Bathing assist Assist Level: Minimal Assistance - Patient > 75%     Upper Body Dressing/Undressing Upper body dressing Upper body dressing/undressing activity did not occur (including orthotics): N/A What is the patient wearing?: Pull over shirt    Upper body assist Assist Level: Set up assist    Lower Body Dressing/Undressing Lower body dressing      What is the patient wearing?: Pants     Lower body assist Assist for lower body dressing: Contact Guard/Touching assist     Toileting Toileting    Toileting assist Assist for toileting: Minimal Assistance - Patient > 75%     Transfers Chair/bed transfer  Transfers assist     Chair/bed transfer assist level: Supervision/Verbal cueing     Locomotion Ambulation   Ambulation assist      Assist level: Contact Guard/Touching assist Assistive device: Walker-rolling Max distance: 12   Walk 10 feet activity   Assist     Assist level: Contact Guard/Touching assist Assistive device: Walker-rolling   Walk 50 feet activity   Assist    Assist level: Contact Guard/Touching assist Assistive device: Walker-rolling    Walk 150 feet activity   Assist Walk 150 feet activity did not occur: Safety/medical concerns  Assist level: Contact Guard/Touching assist Assistive device: Walker-rolling    Walk 10 feet on uneven surface  activity   Assist Walk 10 feet on uneven surfaces activity did not occur: Safety/medical concerns   Assist level: Contact Guard/Touching assist Assistive device: Chemical engineer     Assist Will patient use wheelchair at discharge?: No  Type of Wheelchair: Manual    Wheelchair assist level: Supervision/Verbal cueing Max wheelchair distance: 26ft    Wheelchair 50 feet with 2 turns activity    Assist            Wheelchair 150 feet activity     Assist          Blood pressure 117/66, pulse 93, temperature 97.7 F (36.5  C), resp. rate 18, height 4\' 11"  (1.499 m), weight 74.3 kg, SpO2 98 %.  Medical Problem List and Plan: 1.  Decreased functional mobility secondary to PVD status post redo right femoral to below-knee popliteal bypass as well as left common femoral enterectomy 12/09/2019.  Follow-up vascular surgery Dr. Oneida Alar  Continue CIR 2.  Antithrombotics: -DVT/anticoagulation: Eliquis             -antiplatelet therapy: Aspirin 81 mg daily 3.  Failed back syndrome/pain Management:  Oxycodone as needed.   Controlled 8/29- d/c oxycodone             Monitor with increased exertion 4. Mood: Provide emotional support             -antipsychotic agents: N/A  Klonopin for anxiety- changed to PRN as family worried bout side effects  Robitussin PRN.   Lavender drops on forehead at night and chamomile tea with dinner.   Anxiety at/near baseline. 5. Neuropsych: This patient is capable of making decisions on her own behalf. 6. Skin/Wound Care: Routine skin checks  -place wash rag in right groin to help with skin contact  -appreciate vascular following.  7. Fluids/Electrolytes/Nutrition: Routine in and outs.  8.  Acute blood loss anemia.    Hgb 8.9 on 8/27, labs ordered for Monday  Continue to monitor 9.  Rectal bleeding .  Follow-up GI.  CT of abdomen consistent with ischemic colitis.            See #8             Ancef completed. Last dose flagyl on 8/22 10.  Atrial fibrillation RVR.  Follow-up cardiology services..              Metoprolol to 75mg  daily   8/29: slightly elevated Filed Weights   12/20/19 1706 12/21/19 0300 12/22/19 0303  Weight: 78.2 kg 75.5 kg 74.3 kg  11.  GERD.  Protonix 12.  COPD.  Check oxygen saturations every shift 13.  Hyperlipidemia. Lipitor 14.  Trigeminal neuralgia.  Maintained on Lamictal 100 mg twice daily per neurology services 15. Loose stool: d/t abx  -abx completed   -add probiotic, prn imodium  Fiber added on 8/25  Appears to be improving 16. Leukocytosis  WBCs  14.9 on 8/27   UA equivocal, urine culture with multiple species twice.  Continue empiric Keflex for 5 day course.   Afebrile 17. Peripheral edema  Lasix restarted on 8/27  [place] ordered qhs   LOS: 9 days A FACE TO FACE EVALUATION WAS PERFORMED  [name] [name] P Jaquell Seddon 12/22/2019, 1:18 PM

## 2019-12-22 NOTE — Progress Notes (Signed)
Physical Therapy Session Note  Patient Details  Name: [name] [name] MRN: [id number] Date of Birth: [birth year]  Today's Date: 12/22/2019 PT Individual Time: 1332-1415 PT Individual Time Calculation (min): 43 min   Short Term Goals: Week 1:  PT Short Term Goal 1 (Week 1): STGs=LTGs due to ELOS  Skilled Therapeutic Interventions/Progress Updates:     Patient in bed asleep upon PT arrival. Patient easily aroused and agreeable to PT session. Patient reported 2-3/10 B foot pain due to edema during session, RN made aware. PT provided repositioning, rest breaks, and distraction as pain interventions throughout session. Noted 2+ pitting edema upon observation. Donned B TED hose and non-skid socks with total A bed level at beginning of session for edema control, RN approved TEDs over R LE incision site.   Therapeutic Activity: Bed Mobility: Patient performed supine to/from sit with supervision initially with bed rail, then without following PT cues for practicing home set-up.  Transfers: Patient performed sit to/from stand x3 and stand pivot x1 with CGA progressing to supervision using RW. Provided verbal cues for hand placement on RW and reaching back to sit x1 each.  Gait Training:  Patient ambulated 126 feet x2 using RW with close supervision and w/c follow due to decreased activity tolerance. Ambulated with decreased gait speed, decreased step length and height, increased B hip and knee flexion in stance, forward trunk lean, and downward head gaze. Provided verbal cues for erect posture and paced breathing for improved breath support, and increased step height for safety. Patient ascended/descended 1-4" step x2 using RW with CGA for balance/safety. Performed step-to gait pattern leading with L while ascending and R while descending. Provided cues for technique and sequencing.   Therapeutic Exercise: Patient performed the following exercises with verbal and tactile cues for proper  technique. Ankle pumps x15 as demonstration for bed level exercise for edema control. Instructed to performed 3x15 every hour, patient stated understanding.  Patient requires increased time and rest breaks throughout session due to decreased activity tolerance. Discussed home set-up and educated on energy conservation techniques at home and in the community during rest breaks.   Patient in bed at end of session with breaks locked, bed alarm set, and all needs within reach.    Therapy Documentation Precautions:  Precautions Precautions: Fall Precaution Comments: watch HR  Restrictions Weight Bearing Restrictions: No    Therapy/Group: Individual Therapy  Nayara Taplin L Antonyo Hinderer PT, DPT  12/22/2019, 3:45 PM

## 2019-12-22 NOTE — Progress Notes (Signed)
Occupational Therapy Session Note  Patient Details  Name: [name] [name] MRN: [id number] Date of Birth: October 01, 1924  Today's Date: 12/22/2019 OT Group Time: 1100-1200 OT Group Time Calculation (min): 60 min  Individual Treatment Time: 1200-12:13 Individual Treatment Time Calculation: 13 min  Skilled Therapeutic Interventions/Progress Updates:    Pt engaged in therapeutic w/c level dance group focusing on patient choice, UE/LE strengthening, salience, activity tolerance, and social participation. Pt was guided through various dance-based exercises involving UEs/LEs and trunk. All music was selected by group members. Emphasis placed on standing balance and activity tolerance. Pt exhibited high levels of participation throughout group, declined standing but very energetic while seated. She conversed with other group members and suggested songs to listen to. At end of session she was escorted back to room and completed toileting at ambulatory level using RW with CGA. Min A for donning clean brief but pt able to complete other aspects of toileting with supervision-CGA. After handwashing at the sink, pt returned to bed. Vcs for boosting herself up in bed using 4 extremities with bed placed in trendelenburg position and lower bedrails. Pt remained in bed with all needs within reach and bed alarm set.    Therapy Documentation Precautions:  Precautions Precautions: Fall Precaution Comments: watch HR  Restrictions Weight Bearing Restrictions: No Pain: no s/s pain during tx   ADL: ADL Eating: Independent Grooming: Setup Where Assessed-Grooming: Edge of bed Upper Body Bathing: Setup Lower Body Bathing: Moderate assistance Where Assessed-Lower Body Bathing: Edge of bed Upper Body Dressing: Setup Where Assessed-Upper Body Dressing: Edge of bed Lower Body Dressing: Maximal assistance Where Assessed-Lower Body Dressing: Edge of bed Toilet Transfer: Minimal assistance Social research officer, government  Method: Unable to assess     Therapy/Group: Group Therapy and Individual Therapy   Roen Macgowan A Haydee Jabbour 12/22/2019, 4:15 PM

## 2019-12-23 ENCOUNTER — Inpatient Hospital Stay (HOSPITAL_COMMUNITY): Payer: Medicare Other | Admitting: Occupational Therapy

## 2019-12-23 ENCOUNTER — Inpatient Hospital Stay (HOSPITAL_COMMUNITY): Payer: Medicare Other

## 2019-12-23 LAB — CBC WITH DIFFERENTIAL/PLATELET
Abs Immature Granulocytes: 0.25 10*3/uL — ABNORMAL HIGH (ref 0.00–0.07)
Basophils Absolute: 0.1 10*3/uL (ref 0.0–0.1)
Basophils Relative: 1 %
Eosinophils Absolute: 0.4 10*3/uL (ref 0.0–0.5)
Eosinophils Relative: 2 %
HCT: 29.4 % — ABNORMAL LOW (ref 36.0–46.0)
Hemoglobin: 9.1 g/dL — ABNORMAL LOW (ref 12.0–15.0)
Immature Granulocytes: 2 %
Lymphocytes Relative: 11 %
Lymphs Abs: 1.7 10*3/uL (ref 0.7–4.0)
MCH: 28.8 pg (ref 26.0–34.0)
MCHC: 31 g/dL (ref 30.0–36.0)
MCV: 93 fL (ref 80.0–100.0)
Monocytes Absolute: 1 10*3/uL (ref 0.1–1.0)
Monocytes Relative: 7 %
Neutro Abs: 11.7 10*3/uL — ABNORMAL HIGH (ref 1.7–7.7)
Neutrophils Relative %: 77 %
Platelets: 284 10*3/uL (ref 150–400)
RBC: 3.16 MIL/uL — ABNORMAL LOW (ref 3.87–5.11)
RDW: 14.5 % (ref 11.5–15.5)
WBC: 15 10*3/uL — ABNORMAL HIGH (ref 4.0–10.5)
nRBC: 0 % (ref 0.0–0.2)

## 2019-12-23 LAB — BASIC METABOLIC PANEL
Anion gap: 11 (ref 5–15)
BUN: 15 mg/dL (ref 8–23)
CO2: 26 mmol/L (ref 22–32)
Calcium: 8.3 mg/dL — ABNORMAL LOW (ref 8.9–10.3)
Chloride: 99 mmol/L (ref 98–111)
Creatinine, Ser: 1.11 mg/dL — ABNORMAL HIGH (ref 0.44–1.00)
GFR calc Af Amer: 50 mL/min — ABNORMAL LOW (ref >60)
GFR calc non Af Amer: 43 mL/min — ABNORMAL LOW (ref >60)
Glucose, Bld: 120 mg/dL — ABNORMAL HIGH (ref 70–99)
Potassium: 4.4 mmol/L (ref 3.5–5.1)
Sodium: 136 mmol/L (ref 135–145)

## 2019-12-23 MED ORDER — PANTOPRAZOLE SODIUM 40 MG PO TBEC: 40.0000 mg | DELAYED_RELEASE_TABLET | Freq: Two times a day (BID) | ORAL | 0 refills | Status: DC

## 2019-12-23 MED ORDER — ACETAMINOPHEN 325 MG PO TABS: 325.0000 mg | ORAL_TABLET | ORAL | Status: DC | PRN

## 2019-12-23 MED ORDER — FUROSEMIDE 20 MG PO TABS: 20.0000 mg | ORAL_TABLET | Freq: Every day | ORAL | 0 refills | Status: DC

## 2019-12-23 MED ORDER — CLONAZEPAM 0.25 MG PO TBDP
0.2500 mg | ORAL_TABLET | Freq: Every evening | ORAL | 0 refills | Status: DC | PRN
Start: 2019-12-23 — End: 2020-12-09

## 2019-12-23 MED ORDER — METOPROLOL SUCCINATE ER 25 MG PO TB24: 75.0000 mg | ORAL_TABLET | Freq: Every day | ORAL | 0 refills | Status: DC

## 2019-12-23 MED ORDER — LAMOTRIGINE 100 MG PO TABS: 100.0000 mg | ORAL_TABLET | Freq: Two times a day (BID) | ORAL | 5 refills | Status: DC

## 2019-12-23 MED ORDER — SACCHAROMYCES BOULARDII 250 MG PO CAPS: 250.0000 mg | ORAL_CAPSULE | Freq: Two times a day (BID) | ORAL | 0 refills | Status: DC

## 2019-12-23 MED ORDER — APIXABAN 5 MG PO TABS: 5.0000 mg | ORAL_TABLET | Freq: Two times a day (BID) | ORAL | 0 refills | Status: DC

## 2019-12-23 MED ORDER — CALCIUM POLYCARBOPHIL 625 MG PO TABS: 625.0000 mg | ORAL_TABLET | Freq: Every day | ORAL | 0 refills | Status: DC

## 2019-12-23 MED ORDER — ATORVASTATIN CALCIUM 40 MG PO TABS: 40.0000 mg | ORAL_TABLET | Freq: Every day | ORAL | 3 refills | Status: DC

## 2019-12-23 MED FILL — clonazePAM 0.25 MG TBDP: 0.25 | 20 days supply | Qty: 20 | Fill #0

## 2019-12-23 MED FILL — METOPROLOL SUCCINATE ER 25: 25 | 30 days supply | Qty: 90 | Fill #0

## 2019-12-23 MED FILL — ATORVASTATIN CALCIUM 40 MG: 40 | 90 days supply | Qty: 90 | Fill #0

## 2019-12-23 MED FILL — lamoTRIgine 100 MG TABS: 100 | 30 days supply | Qty: 60 | Fill #0

## 2019-12-23 MED FILL — ELIQUIS 5 MG TABLET: 5 | 30 days supply | Qty: 60 | Fill #0

## 2019-12-23 MED FILL — FUROSEMIDE 20 MG TAB: 20 | 30 days supply | Qty: 30 | Fill #0

## 2019-12-23 MED FILL — FLORASTOR 250 MG CAPSULE: 250 | 10 days supply | Qty: 20 | Fill #0

## 2019-12-23 MED FILL — PANTOPRAZOLE SOD DR 40 MG T: 40 | 30 days supply | Qty: 60 | Fill #0

## 2019-12-23 NOTE — Progress Notes (Signed)
Occupational Therapy Session Note  Patient Details  Name: [name] [name] MRN: [id number] Date of Birth: [birth year]  Today's Date: 12/23/2019 OT Individual Time: 1006-1105 and 1415-1454 OT Individual Time Calculation (min): 59 min and 39 min   Short Term Goals: Week 1:  OT Short Term Goal 1 (Week 1): STG=LTG  Skilled Therapeutic Interventions/Progress Updates:    1) Treatment session with focus on self-care retraining and family education with pt and pt's daughter, [name].  Pt received supine in bed expressing desire to engage in bathing/dressing this session.  Pt completed bed mobility Mod I and ambulated to sink with RW.  Pt reports feeling not ready for bathing at shower level yet, educated pt and pt's daughter how to cover incision prior to showering.  Pt completed bathing at setup level at sit > stand at sink.  Pt doffed pants at sit > stand level with supervision and cues provided for sequencing when donning pants.  Pt able to utilize sock aid when donning hospital socks, did require assistance with use of plastic sheet to assist in donning TEDS.  Educated daughter on technique to assist with donning TEDS.  Provided pt with walker bag and educated on safe hand placement during mobility and transporting items.  Pt's daughter asking good questions, reports plan for 24/7 supervision initially from 3 children.  Pt remained upright in w/c with all needs in reach and daughter present.  2) Treatment session with focus on bathroom transfers and functional mobility in kitchen/home environment.  Pt reports fatigue but agreeable to therapy session.  Pt ambulates to toilet with RW with supervision.  Pt with 1 LOB when opening bathroom door but able to correct without any assistance or cueing.  Pt asking if she can toilet without calling staff, encouraged pt to continue calling for staff due to LOB and needing assist with incontinence brief (which pt will not be wearing at d/c).  Engaged in walk-in shower  transfer in ADL apt with RW with supervision.  Pt ambulated over 3" threshold with RW, taking RW in to simulated shower as she reports RW will fit in her large walk-in shower.  Pt reports "worn out".  Engaged in discussion regarding meal prep and use of counters for transporting items as well as crockpot meals due to decreased activity tolerance. Pt also reports that her son is a good cook and between him and various friends she should have meals covered for awhile.  Returned to room and transferred back to bed due to fatigue.  Pt reports feeling prepared for d/c home tomorrow.  Therapy Documentation Precautions:  Precautions Precautions: Fall Precaution Comments: watch HR  Restrictions Weight Bearing Restrictions: No Pain:   1)Pt with no c/o pain  2)Pt with no c/o pain   Therapy/Group: Individual Therapy  Simonne Come 12/23/2019, 9:46 AM

## 2019-12-23 NOTE — Progress Notes (Signed)
Physical Therapy Session Note  Patient Details  Name: [name] [name] MRN: [id number] Date of Birth: Jul 07, 1924  Today's Date: 12/23/2019 PT Individual Time: [phone] and [id number] PT Individual Time Calculation (min): 56 min and 23 min  Short Term Goals: Week 1:  PT Short Term Goal 1 (Week 1): STGs=LTGs due to ELOS  Skilled Therapeutic Interventions/Progress Updates:   Treatment Session 1: 1103-1594 56 min Received pt sitting on toilet, pt agreeable to therapy, and denied any pain at rest but reported increased bilateral foot pain with standing activities. Session with emphasis on discharge planning, functional mobility/transfers, dressing, toileting, generalized strengthening, dynamic standing balance/coordination, ambulation, simulated car transfers, stair navigation, and improved activity tolerance. Pt with small BM and able to perform hygiene management independently. Donned pants sitting on toilet with mod A and pt transferred sit<>stand with RW CGA and required mod A to pull brief/pants over hips for time management purposes. Pt ambulated 35ft with RW and supervision to [facility] and washed hands sitting in WC at sink with supervision. Pt transported to ortho gym in [facility] total A and performed ambulatory car transfer with RW and supervision and ambulated 71ft on uneven surfaces (ramp) with RW and supervision. Pt picked up cup with RW and CGA with cues to position body closer to RW to pick up object. Pt transported to therapy gym in [facility] total A and ambulated 173ft with RW and supervision but reported increased bilateral foot pain. Pt required multiple rest/water breaks throughout session due to increased fatigue and poor endurance. Pt navigated 4 steps ascending with 1 rail and descending with 2 rails with CGA using a step to pattern. Pt with decreased ankle DF bilaterally resulting in foot slap onto step. Pt performed WC mobility >139ft using bilateral UEs and supervision back to room with 3 rest breaks  in between. Pt transferred WC<>bed stand<>pivot without AD and CGA with cues to lock WC brakes completely prior to transferring. Sit<>supine independently. Concluded session with pt supine in bed, needs within reach, and bed alarm on.   Treatment Session 2: 5859-2924 23 min Received pt supine in bed asleep, upon wakening pt reported extreme fatigue from previous therapies and requested to remain in bed. However, pt agreeable to therapist providing HEP. Therapist provided pt with HEP consisting of the following exercises and educated on frequency/duration/technique: -Mini Squat with Counter Support - 1 x daily - 7 x weekly - 3 sets - 6 reps -Standing Hip Extension with Counter Support - 1 x daily - 7 x weekly - 3 sets - 8 reps -Standing March with Counter Support - 1 x daily - 7 x weekly - 3 sets - 10 reps -Heel rises with counter support - 1 x daily - 7 x weekly - 3 sets - 10 reps Pt reported no concerns with D/C tomorrow and verbalized safety precautions when standing using RW to therapist. Concluded session with pt supine in bed, needs within reach, and bed alarm on. Therapist provided fresh water for pt.   Therapy Documentation Precautions:  Precautions Precautions: Fall Precaution Comments: watch HR  Restrictions Weight Bearing Restrictions: No   Therapy/Group: Individual Therapy Alfonse Alpers PT, DPT   12/23/2019, 7:31 AM

## 2019-12-23 NOTE — Progress Notes (Signed)
Inpatient Rehabilitation Care Coordinator  Discharge Note  The overall goal for the admission was met for:   Discharge location: [facility] 24/7 FOR SHORT TIME  Length of Stay: Yes-11 DAYS  Discharge activity level: Yes-SUPERVISION LEVEL  Home/community participation: Yes  Services provided included: MD, RD, PT, OT, RN, CM, Pharmacy and SW  Financial Services: Medicare and Private Insurance: [employer]  Follow-up services arranged: Home Health: [facility], DME: [facility]3 IN 1 and Patient/Family request agency HH: PREF PER DAUGHTER, DME: NO PREF  Comments (or additional information):COMFORTABLE WITH DC AND FEELS PREPARED TO [facility]  Patient/Family verbalized understanding of follow-up arrangements: Yes  Individual responsible for coordination of the follow-up plan: PATIENT AND FAYE-DAUGHTER [phone]-CYNTHIA CALL FOR FOLLOW UP  Confirmed correct DME delivered: [place], [place] 12/23/2019    Elease Hashimoto

## 2019-12-23 NOTE — Discharge Instructions (Signed)
Inpatient Rehab Discharge Instructions  [name] [name] Discharge date and time: No discharge date for patient encounter.   Activities/Precautions/ Functional Status: Activity: activity as tolerated Diet: regular diet Wound Care: keep wound clean and dry Functional status:  ___ No restrictions     ___ Walk up steps independently ___ 24/7 supervision/assistance   ___ Walk up steps with assistance ___ Intermittent supervision/assistance  ___ Bathe/dress independently ___ Walk with walker     __x_ Bathe/dress with assistance ___ Walk Independently    ___ Shower independently ___ Walk with assistance    ___ Shower with assistance ___ No alcohol     ___ Return to work/school ________  Special Instructions: No driving smoking or alcohol    COMMUNITY REFERRALS UPON DISCHARGE:    Home Health:   PT & OT                 Agency:[facility] [phone]  Medical Equipment/Items Ordered: 3 IN 1                                                 Agency/Supplier:[employer]    My questions have been answered and I understand these instructions. I will adhere to these goals and the provided educational materials after my discharge from the hospital.  Patient/Caregiver Signature _______________________________ Date __________  Clinician Signature _______________________________________ Date __________  Please bring this form and your medication list with you to all your follow-up doctor's appointments. Information on my medicine - ELIQUIS (apixaban)  This medication education was reviewed with me or my healthcare representative as part of my discharge preparation.    Why was Eliquis prescribed for you? Eliquis was prescribed for you to reduce the risk of a blood clot forming that can cause a stroke if you have a medical condition called atrial fibrillation (a type of irregular heartbeat).  What do You need to know about Eliquis ? Take your Eliquis TWICE DAILY - one tablet in  the morning and one tablet in the evening with or without food. If you have difficulty swallowing the tablet whole please discuss with your pharmacist how to take the medication safely.  Take Eliquis exactly as prescribed by your doctor and DO NOT stop taking Eliquis without talking to the doctor who prescribed the medication.  Stopping may increase your risk of developing a stroke.  Refill your prescription before you run out.  After discharge, you should have regular check-up appointments with your healthcare provider that is prescribing your Eliquis.  In the future your dose may need to be changed if your kidney function or weight changes by a significant amount or as you get older.  What do you do if you miss a dose? If you miss a dose, take it as soon as you remember on the same day and resume taking twice daily.  Do not take more than one dose of ELIQUIS at the same time to make up a missed dose.  Important Safety Information A possible side effect of Eliquis is bleeding. You should call your healthcare provider right away if you experience any of the following: ? Bleeding from an injury or your nose that does not stop. ? Unusual colored urine (red or dark brown) or unusual colored stools (red or black). ? Unusual bruising for unknown reasons. ? A serious fall or if you hit your head (  even if there is no bleeding).  Some medicines may interact with Eliquis and might increase your risk of bleeding or clotting while on Eliquis. To help avoid this, consult your healthcare provider or pharmacist prior to using any new prescription or non-prescription medications, including herbals, vitamins, non-steroidal anti-inflammatory drugs (NSAIDs) and supplements.  This website has more information on Eliquis (apixaban): http://www.eliquis.com/eliquis/home

## 2019-12-23 NOTE — Progress Notes (Signed)
Patient ID: [name] [name], female   DOB: [birth year], 87 y.o.   MRN: [id number]  Met with pt and daughter-faye to answer their questions. Aware [facility] will be contacting Cynthia-daughter per pt';s request to arrange home health visits. Pt also had questions regarding coverage for depends. Made aware private pay option. Will see in am to answer any last minute questions. Pt and daughter feel prepared for discharge tomorrow.

## 2019-12-23 NOTE — Progress Notes (Signed)
Occupational Therapy Discharge Summary  Patient Details  Name: [name] [name] MRN: [id number] Date of Birth: Dec 10, 1924  Patient has met 7 of 9 long term goals due to improved activity tolerance, improved balance, postural control and ability to compensate for deficits.  Patient to discharge at overall Supervision level with transfers, LB dressing, and bathing, Mod I with toileting and dynamic standing balance .  Patient's care partner is independent to provide the necessary intermittent assistance at discharge.  Pt's adult children have attended family education sessions and plan to provide 24/7 supervision initially upon d/c.  Reasons goals not met: Pt continues to require supervision/cues for LB dressing and toilet transfers due to decreased balance reactions  Recommendation:  Patient will benefit from ongoing skilled OT services in home health setting to continue to advance functional skills in the area of BADL and Reduce care partner burden.  Equipment: 3 in 1  Reasons for discharge: treatment goals met and discharge from hospital  Patient/family agrees with progress made and goals achieved: Yes  OT Discharge Precautions/Restrictions  Precautions Precautions: Fall Precaution Comments: watch HR  Restrictions Weight Bearing Restrictions: No ADL ADL Eating: Independent Grooming: Setup Where Assessed-Grooming: Edge of bed Upper Body Bathing: Setup Lower Body Bathing: Moderate assistance Where Assessed-Lower Body Bathing: Edge of bed Upper Body Dressing: Setup Where Assessed-Upper Body Dressing: Edge of bed Lower Body Dressing: Maximal assistance Where Assessed-Lower Body Dressing: Edge of bed Toilet Transfer: Minimal assistance Social research officer, government Method: Unable to assess Vision Baseline Vision/History: No visual deficits;Wears glasses Wears Glasses: Reading only Patient Visual Report: No change from baseline Vision Assessment?: No apparent visual  deficits Perception  Perception: Within Functional Limits Praxis Praxis: Intact Cognition Overall Cognitive Status: Within Functional Limits for tasks assessed Arousal/Alertness: Awake/alert Orientation Level: Oriented X4 Memory: Appears intact Awareness: Appears intact Problem Solving: Appears intact Safety/Judgment: Appears intact Sensation Sensation Light Touch: Appears Intact Proprioception: Appears Intact Coordination Gross Motor Movements are Fluid and Coordinated: No Fine Motor Movements are Fluid and Coordinated: Yes Coordination and Movement Description: grossly uncoordinated due to generalized weakness, decreased endurance, and decreased balance/postural control. However, pt able to compensate well with RW. Finger Nose Finger Test: [facility] bilaterally Heel Shin Test: [facility] bilaterally Motor  Motor Motor: Within Functional Limits Motor - Skilled Clinical Observations: grossly uncoordinated due to generalized weakness, decreased endurance, and decreased balance/postural control. However, able to compensate well with RW. Mobility  Bed Mobility Bed Mobility: Rolling Right;Rolling Left;Sit to Supine;Supine to Sit Rolling Right: Independent Rolling Left: Independent Supine to Sit: Independent Sit to Supine: Independent Transfers Sit to Stand: Supervision/Verbal cueing Stand to Sit: Supervision/Verbal cueing  Trunk/Postural Assessment  Cervical Assessment Cervical Assessment: Within Functional Limits Thoracic Assessment Thoracic Assessment: Exceptions to [facility] (kyphosis) Lumbar Assessment Lumbar Assessment: Exceptions to [facility] (posterior pelvic tilt) Postural Control Postural Control: Deficits on evaluation  Balance Balance Balance Assessed: Yes Static Sitting Balance Static Sitting - Balance Support: Feet supported;No upper extremity supported Static Sitting - Level of Assistance: 7: Independent Dynamic Sitting Balance Dynamic Sitting - Balance Support: Feet  supported;No upper extremity supported Dynamic Sitting - Level of Assistance: 7: Independent Static Standing Balance Static Standing - Balance Support: Bilateral upper extremity supported (RW) Static Standing - Level of Assistance: 5: Stand by assistance (supervision) Dynamic Standing Balance Dynamic Standing - Balance Support: Bilateral upper extremity supported (RW) Dynamic Standing - Level of Assistance: 5: Stand by assistance (supervision) Extremity/Trunk Assessment RUE Assessment RUE Assessment: Within Functional Limits LUE Assessment LUE Assessment: Within Functional Limits   Thierry Dobosz,  [facility] 12/23/2019, 9:48 AM

## 2019-12-23 NOTE — Progress Notes (Signed)
Physical Therapy Discharge Summary  Patient Details  Name: [name] [name] MRN: [id number] Date of Birth: Apr 22, 1925  Patient has met 8 of 9 long term goals due to improved activity tolerance, improved balance, improved postural control, increased strength, improved attention, improved awareness and improved coordination. Patient to discharge at an ambulatory level Supervision. Patient's care partner is independent to provide the necessary physical assistance at discharge. Pt's children have been present during multiple therapy sessions and have verbalized confidence with all tasks to ensure safe discharge home. Pt's children did not perform hands on practice, however pt is at a supervision level and does not require any physical assist. Therapist educated pt's son (primary caregiver for pt) on need to provide close supervision with ambulation and stair navigation and occasional verbal reminders for safety.   Reasons goals not met: Pt did not meet stair goal as she currently requires CGA to navigate 4 steps with 1-2 rails due to generalized weakness, bilateral foot pain, and decreased bilateral ankle dorsiflexion. However, pt is currently able to navigate 1 curb with RW and CGA to simulate home environment. Pt has verbalized and demonstrated confidence with this task.   Recommendation:  Patient will benefit from ongoing skilled PT services in home health setting to continue to advance safe functional mobility, address ongoing impairments in transfers, generalized strengthening, dynamic standing balance/coordination, ambulation, endurance, and to minimize fall risk.  Equipment: No equipment provided  Reasons for discharge: treatment goals met  Patient/family agrees with progress made and goals achieved: Yes  PT Discharge Precautions/Restrictions Precautions Precautions: Fall Precaution Comments: watch HR  Restrictions Weight Bearing Restrictions: No Cognition Overall Cognitive Status:  Within Functional Limits for tasks assessed Arousal/Alertness: Awake/alert Orientation Level: Oriented X4 Memory: Appears intact Awareness: Appears intact Problem Solving: Appears intact Safety/Judgment: Appears intact Sensation Sensation Light Touch: Appears Intact Proprioception: Appears Intact Coordination Gross Motor Movements are Fluid and Coordinated: No Fine Motor Movements are Fluid and Coordinated: Yes Coordination and Movement Description: grossly uncoordinated due to generalized weakness, decreased endurance, and decreased balance/postural control. However, pt able to compensate well with RW. Finger Nose Finger Test: [facility] bilaterally Heel Shin Test: [facility] bilaterally Motor  Motor Motor: Within Functional Limits Motor - Skilled Clinical Observations: grossly uncoordinated due to generalized weakness, decreased endurance, and decreased balance/postural control. However, able to compensate well with RW. Mobility Bed Mobility Bed Mobility: Rolling Right;Rolling Left;Sit to Supine;Supine to Sit Rolling Right: Independent Rolling Left: Independent Supine to Sit: Independent Sit to Supine: Independent Transfers Transfers: Sit to Stand;Stand to Sit;Stand Pivot Transfers Sit to Stand: Supervision/Verbal cueing Stand to Sit: Supervision/Verbal cueing Stand Pivot Transfers: Supervision/Verbal cueing Stand Pivot Transfer Details: Verbal cues for technique;Verbal cues for safe use of DME/AE Stand Pivot Transfer Details (indicate cue type and reason): occasional cues for hand placement and RW safety Transfer (Assistive device): Rolling walker Locomotion  Gait Ambulation: Yes Gait Assistance: Supervision/Verbal cueing Gait Distance (Feet): 155 Feet Assistive device: Rolling walker Gait Assistance Details: Verbal cues for technique Gait Assistance Details: occasional verbal cues for RW safety Gait Gait: Yes Gait Pattern: Impaired Gait Pattern: Decreased trunk rotation;Step-to  pattern;Decreased stride length;Decreased step length - right;Decreased step length - left;Trunk flexed;Poor foot clearance - left;Poor foot clearance - right;Narrow base of support Gait velocity: decreased Stairs / Additional Locomotion Stairs: Yes Stairs Assistance: Contact Guard/Touching assist Stair Management Technique: One rail Right Number of Stairs: 4 Height of Stairs: 6 Ramp: Supervision/Verbal cueing (RW) Curb: Contact Guard/Touching assist (RW) Wheelchair Mobility Wheelchair Mobility: Yes Wheelchair Assistance: Supervision/Verbal cueing  Wheelchair Propulsion: Both upper extremities Wheelchair Parts Management: Needs assistance Distance: >130f  Trunk/Postural Assessment  Cervical Assessment Cervical Assessment: Within Functional Limits Thoracic Assessment Thoracic Assessment: Exceptions to WFL (kyphosis) Lumbar Assessment Lumbar Assessment: Ex[facility] to [facility](post[facility]) Postural Control Postural Control: Deficits on evaluation  Balance Balance Balance Assessed: Yes Static Sitting Balance Static Sitting - Balance Support: Feet supported;No upper extremity supported Static Sitting - Level of Assistance: 7: Independent Dynamic Sitting Balance Dynamic Sitting - Balance Support: Feet supported;No upper extremity supported Dynamic Sitting - Level of Assistance: 7: Independent Static Standing Balance Static Standing - Balance Support: Bilateral upper extremity supported (RW) Static Standing - Level of Assistance: 5: Stand by assistance (supervision) Dynamic Standing Balance Dynamic Standing - Balance Support: Bilateral upper extremity supported (RW) Dynamic Standing - Level of Assistance: 5: Stand by assistance (supervision) Extremity Assessment  RLE Assessment RLE Assessment: Exceptions to [facility] St[facility] Comments: grossly generalized to 4-/5 (limited by pain with PF) LLE Assessment LLE Assessment: Exceptions to [facility] Strength Comments:  grossly generalized to 4-/5 (limited by pain with PF)   AAlfonse AlpersPT, DPT  12/23/2019, 7:40 AM

## 2019-12-23 NOTE — Progress Notes (Signed)
PHYSICAL MEDICINE & REHABILITATION PROGRESS NOTE   Subjective/Complaints: In good spirits. Excited to be going home tomorrow  ROS: Patient denies fever, rash, sore throat, blurred vision, nausea, vomiting, diarrhea, cough, shortness of breath or chest pain, joint or back pain, headache, or mood change.    Objective:   No results found. Recent Labs    12/23/19 0507  WBC 15.0*  HGB 9.1*  HCT 29.4*  PLT 284   Recent Labs    12/23/19 0507  NA 136  K 4.4  CL 99  CO2 26  GLUCOSE 120*  BUN 15  CREATININE 1.11*  CALCIUM 8.3*    Intake/Output Summary (Last 24 hours) at 12/23/2019 1230 Last data filed at 12/23/2019 0810 Gross per 24 hour  Intake 552 ml  Output --  Net 552 ml     Physical Exam: Vital Signs Blood pressure 135/80, pulse 65, temperature 98.1 F (36.7 C), resp. rate 18, height 4\' 11"  (1.499 m), weight 74.3 kg, SpO2 97 %. Constitutional: No distress . Vital signs reviewed. HEENT: EOMI, oral membranes moist Neck: supple Cardiovascular: RRR without murmur. No JVD    Respiratory/Chest: CTA Bilaterally without wheezes or rales. Normal effort    GI/Abdomen: BS +, non-tender, non-distended Ext: no clubbing, cyanosis, or edema Psych: pleasant and cooperative Skin: Right lower extremity incision CDI Musc: Bilateral lower extremity edema R>L Neuro: Alert Motor: B/l UE 5/5 RLE: 3/5 proximally, 4/5 ADF with pain inhbition LLE: 4/5 proximal to distal  Assessment/Plan: 1. Functional deficits secondary to PAD s/p right FPBG which require 3+ hours per day of interdisciplinary therapy in a comprehensive inpatient rehab setting.  Physiatrist is providing close team supervision and 24 hour management of active medical problems listed below.  Physiatrist and rehab team continue to assess barriers to discharge/monitor patient progress toward functional and medical goals  Care Tool:  Bathing    Body parts bathed by patient: Right arm, Left arm, Chest,  Abdomen, Front perineal area, Buttocks, Right upper leg, Left upper leg, Face   Body parts bathed by helper: Right lower leg, Left lower leg     Bathing assist Assist Level: Set up assist     Upper Body Dressing/Undressing Upper body dressing Upper body dressing/undressing activity did not occur (including orthotics): N/A What is the patient wearing?: Pull over shirt    Upper body assist Assist Level: Independent    Lower Body Dressing/Undressing Lower body dressing      What is the patient wearing?: Pants     Lower body assist Assist for lower body dressing: Supervision/Verbal cueing     Toileting Toileting    Toileting assist Assist for toileting: Independent with assistive device     Transfers Chair/bed transfer  Transfers assist     Chair/bed transfer assist level: Supervision/Verbal cueing     Locomotion Ambulation   Ambulation assist      Assist level: Supervision/Verbal cueing Assistive device: Walker-rolling Max distance: 126ft   Walk 10 feet activity   Assist     Assist level: Supervision/Verbal cueing Assistive device: Walker-rolling   Walk 50 feet activity   Assist    Assist level: Supervision/Verbal cueing Assistive device: Walker-rolling    Walk 150 feet activity   Assist Walk 150 feet activity did not occur: Safety/medical concerns  Assist level: Supervision/Verbal cueing Assistive device: Walker-rolling    Walk 10 feet on uneven surface  activity   Assist Walk 10 feet on uneven surfaces activity did not occur: Safety/medical concerns   Assist level:  Supervision/Verbal cueing Assistive device: Aeronautical engineer Will patient use wheelchair at discharge?: No Type of Wheelchair: Manual    Wheelchair assist level: Supervision/Verbal cueing Max wheelchair distance: >145ft    Wheelchair 50 feet with 2 turns activity    Assist        Assist Level: Supervision/Verbal cueing    Wheelchair 150 feet activity     Assist      Assist Level: Supervision/Verbal cueing   Blood pressure 135/80, pulse 65, temperature 98.1 F (36.7 C), resp. rate 18, height 4\' 11"  (1.499 m), weight 74.3 kg, SpO2 97 %.  Medical Problem List and Plan: 1.  Decreased functional mobility secondary to PVD status post redo right femoral to below-knee popliteal bypass as well as left common femoral enterectomy 12/09/2019.  Follow-up vascular surgery Dr. Oneida Alar  Continue CIR  -ELOS 12/24/19 2.  Antithrombotics: -DVT/anticoagulation: Eliquis             -antiplatelet therapy: Aspirin 81 mg daily 3.  Failed back syndrome/pain Management:  Oxycodone as needed.   Controlled 8/29- d/c'ed oxycodone              4. Mood: Provide emotional support             -antipsychotic agents: N/A  Klonopin for anxiety- changed to PRN as family worried bout side effects  Robitussin PRN.   Lavender drops on forehead at night and chamomile tea with dinner.   Anxiety at/near baseline. 5. Neuropsych: This patient is capable of making decisions on her own behalf. 6. Skin/Wound Care: Routine skin checks  -can place wash rag in right groin to help with skin contact  -appreciate vascular following.  7. Fluids/Electrolytes/Nutrition: Routine in and outs.  8.  Acute blood loss anemia.    Hgb up to 9.1 8/30  Continue to monitor 9.  Rectal bleeding .  Follow-up GI.  CT of abdomen consistent with ischemic colitis.            See #8             Ancef completed. Last dose flagyl on 8/22 10.  Atrial fibrillation RVR.  Follow-up cardiology services..              Metoprolol to 75mg  daily   8/30 HR controlled Filed Weights   12/20/19 1706 12/21/19 0300 12/22/19 0303  Weight: 78.2 kg 75.5 kg 74.3 kg  11.  GERD.  Protonix 12.  COPD.  Check oxygen saturations every shift 13.  Hyperlipidemia. Lipitor 14.  Trigeminal neuralgia.  Maintained on Lamictal 100 mg twice daily per neurology services 15. Loose stool: d/t  abx  -abx completed   -add probiotic, prn imodium  Fiber added on 8/25  Appears to be improving 16. Leukocytosis  WBCs 14.9 on 8/27   UA equivocal, urine culture with multiple species twice.  Continue empiric Keflex for 5 day course.   Afebrile 17. Peripheral edema  Lasix restarted on 8/27  Periwick ordered qhs   LOS: 10 days A FACE TO [place] 12/23/2019, 12:30 PM

## 2019-12-24 NOTE — Progress Notes (Signed)
[facility] PHYSICAL MEDICINE & REHABILITATION PROGRESS NOTE   Subjective/Complaints: Excited to go home! Had BM today. Daughter will be picking her up. Urinating frequently on Lasix  ROS: Patient denies fever, rash, sore throat, blurred vision, nausea, vomiting, diarrhea, cough, shortness of breath or chest pain, joint or back pain, headache, or mood change.    Objective:   No results found. Recent Labs    12/23/19 0507  WBC 15.0*  HGB 9.1*  HCT 29.4*  PLT 284   Recent Labs    12/23/19 0507  NA 136  K 4.4  CL 99  CO2 26  GLUCOSE 120*  BUN 15  CREATININE 1.11*  CALCIUM 8.3*    Intake/Output Summary (Last 24 hours) at 12/24/2019 1011 Last data filed at 12/24/2019 0831 Gross per 24 hour  Intake 714 ml  Output --  Net 714 ml     Physical Exam: Vital Signs Blood pressure 134/77, pulse 100, temperature 98.6 F (37 C), resp. rate 18, height 4\' 11"  (1.499 m), weight 74.2 kg, SpO2 96 %. General: Alert and oriented x 3, No apparent distress HEENT: Head is normocephalic, atraumatic, PERRLA, EOMI, sclera anicteric, oral mucosa pink and moist, dentition intact, ext ear canals clear,  Neck: Supple without JVD or lymphadenopathy Heart: Reg rate and rhythm. No murmurs rubs or gallops Chest: CTA bilaterally without wheezes, rales, or rhonchi; no distress Abdomen: Soft, non-tender, non-distended, bowel sounds positive. Extremities: No clubbing, cyanosis, or edema. Pulses are 2+ Psych: pleasant and cooperative Skin: Right lower extremity incision CDI Musc: Bilateral lower extremity edema R>L Neuro: Alert Motor: B/l UE 5/5 RLE: 3/5 proximally, 4/5 ADF with pain inhbition LLE: 4/5 proximal to distal   Assessment/Plan: 1. Functional deficits secondary to PAD s/p right FPBG which require 3+ hours per day of interdisciplinary therapy in a comprehensive inpatient rehab setting.  Physiatrist is providing close team supervision and 24 hour management of active medical problems  listed below.  Physiatrist and rehab team continue to assess barriers to discharge/monitor patient progress toward functional and medical goals  Care Tool:  Bathing    Body parts bathed by patient: Right arm, Left arm, Chest, Abdomen, Front perineal area, Buttocks, Right upper leg, Left upper leg, Face   Body parts bathed by helper: Right lower leg, Left lower leg     Bathing assist Assist Level: Set up assist     Upper Body Dressing/Undressing Upper body dressing Upper body dressing/undressing activity did not occur (including orthotics): N/A What is the patient wearing?: Pull over shirt    Upper body assist Assist Level: Independent    Lower Body Dressing/Undressing Lower body dressing      What is the patient wearing?: Pants     Lower body assist Assist for lower body dressing: Supervision/Verbal cueing     Toileting Toileting    Toileting assist Assist for toileting: Independent with assistive device     Transfers Chair/bed transfer  Transfers assist     Chair/bed transfer assist level: Supervision/Verbal cueing     Locomotion Ambulation   Ambulation assist      Assist level: Supervision/Verbal cueing Assistive device: Walker-rolling Max distance: 160ft   Walk 10 feet activity   Assist     Assist level: Supervision/Verbal cueing Assistive device: Walker-rolling   Walk 50 feet activity   Assist    Assist level: Supervision/Verbal cueing Assistive device: Walker-rolling    Walk 150 feet activity   Assist Walk 150 feet activity did not occur: Safety/medical concerns  Assist level:  Supervision/Verbal cueing Assistive device: Walker-rolling    Walk 10 feet on uneven surface  activity   Assist Walk 10 feet on uneven surfaces activity did not occur: Safety/medical concerns   Assist level: Supervision/Verbal cueing Assistive device: Aeronautical engineer Will patient use wheelchair at discharge?:  No Type of Wheelchair: Manual    Wheelchair assist level: Supervision/Verbal cueing Max wheelchair distance: >167ft    Wheelchair 50 feet with 2 turns activity    Assist        Assist Level: Supervision/Verbal cueing   Wheelchair 150 feet activity     Assist      Assist Level: Supervision/Verbal cueing   Blood pressure 134/77, pulse 100, temperature 98.6 F (37 C), resp. rate 18, height 4\' 11"  (1.499 m), weight 74.2 kg, SpO2 96 %.  Medical Problem List and Plan: 1.  Decreased functional mobility secondary to PVD status post redo right femoral to below-knee popliteal bypass as well as left common femoral enterectomy 12/09/2019.  Follow-up vascular surgery Dr. Oneida Alar  Continue CIR  -ELOS 12/24/19 2.  Antithrombotics: -DVT/anticoagulation: Eliquis             -antiplatelet therapy: Aspirin 81 mg daily 3.  Failed back syndrome/pain Management:  Oxycodone as needed.   Controlled 8/31- d/c'ed oxycodone 4. Mood: Provide emotional support             -antipsychotic agents: N/A  Klonopin for anxiety- changed to PRN as family worried bout side effects  Robitussin PRN.   Lavender drops on forehead at night and chamomile tea with dinner.   Anxiety at/near baseline. 5. Neuropsych: This patient is capable of making decisions on her own behalf. 6. Skin/Wound Care: Routine skin checks  -can place wash rag in right groin to help with skin contact  -appreciate vascular following.  7. Fluids/Electrolytes/Nutrition: Routine in and outs.  8.  Acute blood loss anemia.    Hgb up to 9.1 8/30  Continue to monitor 9.  Rectal bleeding .  Follow-up GI.  CT of abdomen consistent with ischemic colitis.            See #8             Ancef completed. Last dose flagyl on 8/22 10.  Atrial fibrillation RVR.  Follow-up cardiology services..              Metoprolol to 75mg  daily   8/31: HR slightly elevated- they checked after she had been up going to bathroom.  Filed Weights   12/21/19 0300  12/22/19 0303 12/24/19 0500  Weight: 75.5 kg 74.3 kg 74.2 kg  11.  GERD.  Protonix 12.  COPD.  Check oxygen saturations every shift 13.  Hyperlipidemia. Lipitor 14.  Trigeminal neuralgia.  Maintained on Lamictal 100 mg twice daily per neurology services 15. Loose stool: d/t abx  -abx completed   -add probiotic, prn imodium  Fiber added on 8/25  Appears to be improving 16. Leukocytosis  WBCs 14.9 on 8/27   UA equivocal, urine culture with multiple species twice.  Continue empiric Keflex for 5 day course.   Afebrile 17. Peripheral edema  Lasix restarted on 8/27  Periwick ordered qhs   Weight at baseline  >30 minutes spent in discharge of patient including review of medications and follow-up appointments, physical examination, assessing BP, HR, weight, edema, frequent urination, and in answering all patient's questions   LOS: 11 days A FACE TO [place]  12/24/2019, 10:11 AM

## 2019-12-24 NOTE — Consult Note (Signed)
   [facility] Inpatient Consult   12/24/2019  BAYLI QUESINBERRY 1925/04/02 [id number]  [facility] [ACO] Patient:  Medicare NextGen  Primary Care Provider: [name], MD listed  as primary care provider with [facility], this office is listed to provide the transition of care calls and follow up.  Thank you for your referral request and assigned this patient for outreach.  Plan:  Call attempt to the patient's hospital/rehab room without success.  Notified referral source of [facility] planned follow up post transition.  Natividad Brood, RN BSN [facility] Liaison  [phone] business mobile phone Toll free office [phone]  Fax number: [phone] [email or web address]

## 2019-12-25 ENCOUNTER — Other Ambulatory Visit: Payer: Self-pay | Admitting: *Deleted

## 2019-12-25 DIAGNOSIS — I129 Hypertensive chronic kidney disease with stage 1 through stage 4 chronic kidney disease, or unspecified chronic kidney disease: Secondary | ICD-10-CM | POA: Diagnosis not present

## 2019-12-25 DIAGNOSIS — M961 Postlaminectomy syndrome, not elsewhere classified: Secondary | ICD-10-CM | POA: Diagnosis not present

## 2019-12-25 DIAGNOSIS — Z48812 Encounter for surgical aftercare following surgery on the circulatory system: Secondary | ICD-10-CM | POA: Diagnosis not present

## 2019-12-25 DIAGNOSIS — Z5181 Encounter for therapeutic drug level monitoring: Secondary | ICD-10-CM | POA: Diagnosis not present

## 2019-12-25 DIAGNOSIS — Z95820 Peripheral vascular angioplasty status with implants and grafts: Secondary | ICD-10-CM | POA: Diagnosis not present

## 2019-12-25 DIAGNOSIS — G500 Trigeminal neuralgia: Secondary | ICD-10-CM | POA: Diagnosis not present

## 2019-12-25 DIAGNOSIS — Z7901 Long term (current) use of anticoagulants: Secondary | ICD-10-CM | POA: Diagnosis not present

## 2019-12-25 DIAGNOSIS — M791 Myalgia, unspecified site: Secondary | ICD-10-CM | POA: Diagnosis not present

## 2019-12-25 DIAGNOSIS — J449 Chronic obstructive pulmonary disease, unspecified: Secondary | ICD-10-CM | POA: Diagnosis not present

## 2019-12-25 DIAGNOSIS — I70311 Atherosclerosis of unspecified type of bypass graft(s) of the extremities with intermittent claudication, right leg: Secondary | ICD-10-CM | POA: Diagnosis not present

## 2019-12-25 DIAGNOSIS — I480 Paroxysmal atrial fibrillation: Secondary | ICD-10-CM | POA: Diagnosis not present

## 2019-12-25 DIAGNOSIS — M6281 Muscle weakness (generalized): Secondary | ICD-10-CM | POA: Diagnosis not present

## 2019-12-25 DIAGNOSIS — M5431 Sciatica, right side: Secondary | ICD-10-CM | POA: Diagnosis not present

## 2019-12-25 DIAGNOSIS — N183 Chronic kidney disease, stage 3 unspecified: Secondary | ICD-10-CM | POA: Diagnosis not present

## 2019-12-25 DIAGNOSIS — I70202 Unspecified atherosclerosis of native arteries of extremities, left leg: Secondary | ICD-10-CM | POA: Diagnosis not present

## 2019-12-25 DIAGNOSIS — G8929 Other chronic pain: Secondary | ICD-10-CM | POA: Diagnosis not present

## 2019-12-25 NOTE — Patient Outreach (Signed)
[facility]) Care Management  12/25/2019  CATHRYN GALLERY 1925/01/02 [id number]   Referral received from hospital liaison as member was discharged from [facility] on 8/31 after having redo right femoral below the knee popliteal artery bypass.  Her hospital course was complicated by A-fib with RVR and a GI bleed.  Per chart, she also has history of PAD, HTN, COPD, CKD, HLD, and peripheral edema.    Call placed to member for telephone assessment, was able to speak with son Nicki Reaper.  He report member is currently with nurse from [facility] for initial home assessment.  Identity verified.  This care manager introduced self and stated purpose of call.  [facility] care management services explained.  He state member remains very weak, unsure what her needs will be until after today's evaluation.  He confirms that she has multiple MD appointments coming up, but also state his sister [name] would have more information on member's status and needs.  He will give contact information for this care manager to his sister and have her call to follow up.  Will send successful outreach letter with program information and await call back.  If no call back, will follow up within the next 3-4 business days.  [name], [place], MSN [place] [phone]

## 2019-12-26 DIAGNOSIS — I70311 Atherosclerosis of unspecified type of bypass graft(s) of the extremities with intermittent claudication, right leg: Secondary | ICD-10-CM | POA: Diagnosis not present

## 2019-12-26 DIAGNOSIS — N183 Chronic kidney disease, stage 3 unspecified: Secondary | ICD-10-CM | POA: Diagnosis not present

## 2019-12-26 DIAGNOSIS — Z48812 Encounter for surgical aftercare following surgery on the circulatory system: Secondary | ICD-10-CM | POA: Diagnosis not present

## 2019-12-26 DIAGNOSIS — I129 Hypertensive chronic kidney disease with stage 1 through stage 4 chronic kidney disease, or unspecified chronic kidney disease: Secondary | ICD-10-CM | POA: Diagnosis not present

## 2019-12-26 DIAGNOSIS — I480 Paroxysmal atrial fibrillation: Secondary | ICD-10-CM | POA: Diagnosis not present

## 2019-12-26 DIAGNOSIS — I70202 Unspecified atherosclerosis of native arteries of extremities, left leg: Secondary | ICD-10-CM | POA: Diagnosis not present

## 2019-12-27 DIAGNOSIS — Z48812 Encounter for surgical aftercare following surgery on the circulatory system: Secondary | ICD-10-CM | POA: Diagnosis not present

## 2019-12-27 DIAGNOSIS — I129 Hypertensive chronic kidney disease with stage 1 through stage 4 chronic kidney disease, or unspecified chronic kidney disease: Secondary | ICD-10-CM | POA: Diagnosis not present

## 2019-12-27 DIAGNOSIS — I70202 Unspecified atherosclerosis of native arteries of extremities, left leg: Secondary | ICD-10-CM | POA: Diagnosis not present

## 2019-12-27 DIAGNOSIS — I480 Paroxysmal atrial fibrillation: Secondary | ICD-10-CM | POA: Diagnosis not present

## 2019-12-27 DIAGNOSIS — I70311 Atherosclerosis of unspecified type of bypass graft(s) of the extremities with intermittent claudication, right leg: Secondary | ICD-10-CM | POA: Diagnosis not present

## 2019-12-27 DIAGNOSIS — N183 Chronic kidney disease, stage 3 unspecified: Secondary | ICD-10-CM | POA: Diagnosis not present

## 2019-12-28 DIAGNOSIS — N183 Chronic kidney disease, stage 3 unspecified: Secondary | ICD-10-CM | POA: Diagnosis not present

## 2019-12-28 DIAGNOSIS — I70311 Atherosclerosis of unspecified type of bypass graft(s) of the extremities with intermittent claudication, right leg: Secondary | ICD-10-CM | POA: Diagnosis not present

## 2019-12-28 DIAGNOSIS — I480 Paroxysmal atrial fibrillation: Secondary | ICD-10-CM | POA: Diagnosis not present

## 2019-12-28 DIAGNOSIS — I129 Hypertensive chronic kidney disease with stage 1 through stage 4 chronic kidney disease, or unspecified chronic kidney disease: Secondary | ICD-10-CM | POA: Diagnosis not present

## 2019-12-28 DIAGNOSIS — I70202 Unspecified atherosclerosis of native arteries of extremities, left leg: Secondary | ICD-10-CM | POA: Diagnosis not present

## 2019-12-28 DIAGNOSIS — Z48812 Encounter for surgical aftercare following surgery on the circulatory system: Secondary | ICD-10-CM | POA: Diagnosis not present

## 2019-12-31 ENCOUNTER — Other Ambulatory Visit: Payer: Self-pay | Admitting: *Deleted

## 2019-12-31 DIAGNOSIS — I129 Hypertensive chronic kidney disease with stage 1 through stage 4 chronic kidney disease, or unspecified chronic kidney disease: Secondary | ICD-10-CM | POA: Diagnosis not present

## 2019-12-31 DIAGNOSIS — N183 Chronic kidney disease, stage 3 unspecified: Secondary | ICD-10-CM | POA: Diagnosis not present

## 2019-12-31 DIAGNOSIS — Z48812 Encounter for surgical aftercare following surgery on the circulatory system: Secondary | ICD-10-CM | POA: Diagnosis not present

## 2019-12-31 DIAGNOSIS — I70311 Atherosclerosis of unspecified type of bypass graft(s) of the extremities with intermittent claudication, right leg: Secondary | ICD-10-CM | POA: Diagnosis not present

## 2019-12-31 DIAGNOSIS — I70202 Unspecified atherosclerosis of native arteries of extremities, left leg: Secondary | ICD-10-CM | POA: Diagnosis not present

## 2019-12-31 DIAGNOSIS — I480 Paroxysmal atrial fibrillation: Secondary | ICD-10-CM | POA: Diagnosis not present

## 2019-12-31 NOTE — Patient Outreach (Signed)
[facility]) Care Management  12/31/2019  WM FRUCHTER 1924-10-24 [id number]    Referral received from hospital liaison as member was discharged from [facility] on 8/31 after having redo right femoral below the knee popliteal artery bypass.  Her hospital course was complicated by A-fib with RVR and a GI bleed.  Per chart, she also has history of PAD, HTN, COPD, CKD, HLD, and peripheral edema.    Call placed to member's daughter [name] to assess needs of member.  She report member has [facility], PT currently in the home during conversation.  She state member typically lives alone but all of her children has been helping to care for the member 24/7.  Feels at this time member's healthcare is well managed but also state she would like to time to think about any needs.  Although she feel member is improving she is concerned about the amount of sessions she has received.  State per hospital discharge she was to have 2 visits weekly from PT and OT as well as weekly visits from RN.  Report she has only been receiving 1 visit from each weekly.  She will contact the [facility] office directly to inquire about the number of sessions left.  She will also discuss with surgeon during follow up visit on Thursday.    Unable to complete assessment at this time as she wanted to get back to [facility] PT session.  Agrees to think about [facility] engagement, will follow up within the next week.  [name], [place], MSN [place] [phone]

## 2020-01-01 DIAGNOSIS — N183 Chronic kidney disease, stage 3 unspecified: Secondary | ICD-10-CM | POA: Diagnosis not present

## 2020-01-01 DIAGNOSIS — I480 Paroxysmal atrial fibrillation: Secondary | ICD-10-CM | POA: Diagnosis not present

## 2020-01-01 DIAGNOSIS — I129 Hypertensive chronic kidney disease with stage 1 through stage 4 chronic kidney disease, or unspecified chronic kidney disease: Secondary | ICD-10-CM | POA: Diagnosis not present

## 2020-01-01 DIAGNOSIS — Z48812 Encounter for surgical aftercare following surgery on the circulatory system: Secondary | ICD-10-CM | POA: Diagnosis not present

## 2020-01-01 DIAGNOSIS — I70311 Atherosclerosis of unspecified type of bypass graft(s) of the extremities with intermittent claudication, right leg: Secondary | ICD-10-CM | POA: Diagnosis not present

## 2020-01-01 DIAGNOSIS — I70202 Unspecified atherosclerosis of native arteries of extremities, left leg: Secondary | ICD-10-CM | POA: Diagnosis not present

## 2020-01-02 ENCOUNTER — Telehealth: Payer: Self-pay | Admitting: General Practice

## 2020-01-02 ENCOUNTER — Encounter: Payer: Self-pay | Admitting: *Deleted

## 2020-01-02 ENCOUNTER — Encounter: Payer: Self-pay | Admitting: Vascular Surgery

## 2020-01-02 ENCOUNTER — Telehealth: Payer: Self-pay | Admitting: Cardiovascular Disease

## 2020-01-02 ENCOUNTER — Ambulatory Visit (INDEPENDENT_AMBULATORY_CARE_PROVIDER_SITE_OTHER): Payer: Medicare Other | Admitting: Vascular Surgery

## 2020-01-02 ENCOUNTER — Other Ambulatory Visit: Payer: Self-pay

## 2020-01-02 ENCOUNTER — Encounter: Payer: Medicare Other | Admitting: Vascular Surgery

## 2020-01-02 VITALS — BP 120/76 | HR 71 | Temp 98.0°F | Resp 20 | Ht 59.0 in | Wt 157.0 lb

## 2020-01-02 DIAGNOSIS — I480 Paroxysmal atrial fibrillation: Secondary | ICD-10-CM | POA: Diagnosis not present

## 2020-01-02 DIAGNOSIS — I739 Peripheral vascular disease, unspecified: Secondary | ICD-10-CM

## 2020-01-02 DIAGNOSIS — I129 Hypertensive chronic kidney disease with stage 1 through stage 4 chronic kidney disease, or unspecified chronic kidney disease: Secondary | ICD-10-CM | POA: Diagnosis not present

## 2020-01-02 DIAGNOSIS — N183 Chronic kidney disease, stage 3 unspecified: Secondary | ICD-10-CM | POA: Diagnosis not present

## 2020-01-02 DIAGNOSIS — Z48812 Encounter for surgical aftercare following surgery on the circulatory system: Secondary | ICD-10-CM | POA: Diagnosis not present

## 2020-01-02 DIAGNOSIS — I70311 Atherosclerosis of unspecified type of bypass graft(s) of the extremities with intermittent claudication, right leg: Secondary | ICD-10-CM | POA: Diagnosis not present

## 2020-01-02 DIAGNOSIS — I70202 Unspecified atherosclerosis of native arteries of extremities, left leg: Secondary | ICD-10-CM | POA: Diagnosis not present

## 2020-01-02 NOTE — Progress Notes (Signed)
Patient is a 86 year old female who returns for postoperative follow-up today.  She recently underwent right femoral to below-knee popliteal bypass redo operation as well as left common femoral endarterectomy where she had developed a stenosis above her left femoropopliteal.  She has now been discharged to home from rehab.  She is ambulating fairly well with a walker.  She is still getting 24-hour care at home with her family.  She is requesting home physical therapy to continue to increase her strength.  She has no claudication or rest pain symptoms currently.  She has no incisional drainage.  Physical exam:  Vitals:   01/02/20 1520  BP: 120/76  Pulse: 71  Resp: 20  Temp: 98 F (36.7 C)  SpO2: 94%  Weight: 157 lb (71.2 kg)  Height: 4\' 11"  (1.499 m)    Healing groin and right below-knee incision  Feet pink warm with brisk capillary refill  Assessment: Slowly improving status post revascularization of bilateral lower extremities with right femoral-popliteal bypass and left femoral endarterectomy above her pre-existing left femoropopliteal bypass.  Plan: The patient will follow up in 3 months for bilateral ABIs and bilateral graft duplex scan.  She will call sooner if she needs any additional care.  We will continue her physical therapy for now.  [name], MD [facility] Office: [phone]

## 2020-01-02 NOTE — Telephone Encounter (Signed)
Attempted to place a return call to patient.  No answer but LMOVM.

## 2020-01-02 NOTE — Telephone Encounter (Signed)
[name] [name] is requesting to speak with [name]. Please advise.

## 2020-01-02 NOTE — Telephone Encounter (Signed)
Spoke with the patient who states that she would like to speak with Jenny Reichmann. She states that she has some questions but would not go into further detail.

## 2020-01-03 DIAGNOSIS — N183 Chronic kidney disease, stage 3 unspecified: Secondary | ICD-10-CM | POA: Diagnosis not present

## 2020-01-03 DIAGNOSIS — I129 Hypertensive chronic kidney disease with stage 1 through stage 4 chronic kidney disease, or unspecified chronic kidney disease: Secondary | ICD-10-CM | POA: Diagnosis not present

## 2020-01-03 DIAGNOSIS — I70202 Unspecified atherosclerosis of native arteries of extremities, left leg: Secondary | ICD-10-CM | POA: Diagnosis not present

## 2020-01-03 DIAGNOSIS — Z48812 Encounter for surgical aftercare following surgery on the circulatory system: Secondary | ICD-10-CM | POA: Diagnosis not present

## 2020-01-03 DIAGNOSIS — I480 Paroxysmal atrial fibrillation: Secondary | ICD-10-CM | POA: Diagnosis not present

## 2020-01-03 DIAGNOSIS — I70311 Atherosclerosis of unspecified type of bypass graft(s) of the extremities with intermittent claudication, right leg: Secondary | ICD-10-CM | POA: Diagnosis not present

## 2020-01-06 DIAGNOSIS — N183 Chronic kidney disease, stage 3 unspecified: Secondary | ICD-10-CM | POA: Diagnosis not present

## 2020-01-06 DIAGNOSIS — I480 Paroxysmal atrial fibrillation: Secondary | ICD-10-CM | POA: Diagnosis not present

## 2020-01-06 DIAGNOSIS — Z48812 Encounter for surgical aftercare following surgery on the circulatory system: Secondary | ICD-10-CM | POA: Diagnosis not present

## 2020-01-06 DIAGNOSIS — R82998 Other abnormal findings in urine: Secondary | ICD-10-CM | POA: Diagnosis not present

## 2020-01-06 DIAGNOSIS — I70202 Unspecified atherosclerosis of native arteries of extremities, left leg: Secondary | ICD-10-CM | POA: Diagnosis not present

## 2020-01-06 DIAGNOSIS — R309 Painful micturition, unspecified: Secondary | ICD-10-CM | POA: Diagnosis not present

## 2020-01-06 DIAGNOSIS — I129 Hypertensive chronic kidney disease with stage 1 through stage 4 chronic kidney disease, or unspecified chronic kidney disease: Secondary | ICD-10-CM | POA: Diagnosis not present

## 2020-01-06 DIAGNOSIS — I70311 Atherosclerosis of unspecified type of bypass graft(s) of the extremities with intermittent claudication, right leg: Secondary | ICD-10-CM | POA: Diagnosis not present

## 2020-01-06 DIAGNOSIS — N390 Urinary tract infection, site not specified: Secondary | ICD-10-CM | POA: Diagnosis not present

## 2020-01-07 ENCOUNTER — Other Ambulatory Visit: Payer: Self-pay | Admitting: *Deleted

## 2020-01-07 DIAGNOSIS — Z95828 Presence of other vascular implants and grafts: Secondary | ICD-10-CM

## 2020-01-07 DIAGNOSIS — I739 Peripheral vascular disease, unspecified: Secondary | ICD-10-CM

## 2020-01-07 NOTE — Patient Outreach (Signed)
[name]) Care Management  01/07/2020  [name] [name] 20-May-1924 [id number]   Call placed to member's daughter [name] to follow up on needs and decision to engage with [facility].  She report member has continued to improve since her surgery.  Was seen by the vascular surgeon on 9/9, no needs identified, recommended follow up in 3 months.  She has had home health extended for nursing and PT through [facility], also still has the help of her children.  [name] denies any needs at this time but agrees to contact this care manager if further needs are identified in the future.  Successful letter with this care manager's contact information as well as [facility] brochure was sent to member on 9/1.  Will close case at this time as daughter denies needs.  [name], [place], MSN [place] [phone]

## 2020-01-09 ENCOUNTER — Encounter: Payer: Self-pay | Admitting: Physical Medicine & Rehabilitation

## 2020-01-09 ENCOUNTER — Encounter: Payer: Medicare Other | Attending: Physical Medicine & Rehabilitation | Admitting: Physical Medicine & Rehabilitation

## 2020-01-09 ENCOUNTER — Telehealth: Payer: Self-pay

## 2020-01-09 ENCOUNTER — Other Ambulatory Visit: Payer: Self-pay

## 2020-01-09 VITALS — BP 119/78 | HR 102 | Temp 97.5°F | Ht 59.0 in | Wt 160.0 lb

## 2020-01-09 DIAGNOSIS — G479 Sleep disorder, unspecified: Secondary | ICD-10-CM | POA: Diagnosis not present

## 2020-01-09 DIAGNOSIS — I480 Paroxysmal atrial fibrillation: Secondary | ICD-10-CM | POA: Diagnosis not present

## 2020-01-09 DIAGNOSIS — Z48812 Encounter for surgical aftercare following surgery on the circulatory system: Secondary | ICD-10-CM | POA: Diagnosis not present

## 2020-01-09 DIAGNOSIS — N183 Chronic kidney disease, stage 3 unspecified: Secondary | ICD-10-CM | POA: Diagnosis not present

## 2020-01-09 DIAGNOSIS — M791 Myalgia, unspecified site: Secondary | ICD-10-CM | POA: Insufficient documentation

## 2020-01-09 DIAGNOSIS — M961 Postlaminectomy syndrome, not elsewhere classified: Secondary | ICD-10-CM

## 2020-01-09 DIAGNOSIS — G894 Chronic pain syndrome: Secondary | ICD-10-CM | POA: Diagnosis not present

## 2020-01-09 DIAGNOSIS — I70202 Unspecified atherosclerosis of native arteries of extremities, left leg: Secondary | ICD-10-CM | POA: Diagnosis not present

## 2020-01-09 DIAGNOSIS — R269 Unspecified abnormalities of gait and mobility: Secondary | ICD-10-CM | POA: Diagnosis not present

## 2020-01-09 DIAGNOSIS — M549 Dorsalgia, unspecified: Secondary | ICD-10-CM | POA: Insufficient documentation

## 2020-01-09 DIAGNOSIS — M792 Neuralgia and neuritis, unspecified: Secondary | ICD-10-CM

## 2020-01-09 DIAGNOSIS — I129 Hypertensive chronic kidney disease with stage 1 through stage 4 chronic kidney disease, or unspecified chronic kidney disease: Secondary | ICD-10-CM | POA: Diagnosis not present

## 2020-01-09 DIAGNOSIS — I70311 Atherosclerosis of unspecified type of bypass graft(s) of the extremities with intermittent claudication, right leg: Secondary | ICD-10-CM | POA: Diagnosis not present

## 2020-01-09 NOTE — Telephone Encounter (Signed)
Called [facility] to make aware that pt expressed that she would like her OT/PT extended - this message received by [facility] RN in office. Nicki with [facility] stated PT will go out to reassess the pt on tomorrow and OT will go out next week. After the reassessments are completed she stated they would reach out to our office for additional visits if the pt qualifies.

## 2020-01-09 NOTE — Progress Notes (Signed)
Subjective:    Patient ID: [name] [name], female    DOB: Sep 20, 1924, 86 y.o.   MRN: [id number]   TELEHEALTH NOTE  Due to national recommendations of social distancing due to COVID 19, an audio/video telehealth visit is felt to be most appropriate for this patient at this time.  See Chart message from today for the patient's consent to telehealth from [place].     I verified that I am speaking with the correct person using two identifiers.  Location of patient: Home Location of provider: Office Method of communication: Telephone Names of participants : Zorita Pang scheduling, Jasmine December obtaining consent and vitals if available Established patient Time spent on call: 21 minutes  HPI  Female with pmh/psh of PAD, COPD, DVT, HTN, Gout, trigeminal neuralgia, post herpetic neuralgia, OA of right knee with injections, lumbar fusion present for follow up of pain in her back > leg pain.   Initially stated: Pt stated she has had back pain "all her life".  Getting progressively worse.  Movement improves the pain.  Standing and walking exacerbate the pain. Dull pain.  Non-radiating.  Intermittent.  Denies associated numbness, tingling, weakness.  Lidoderm patch help. Pain limits pt from doing things around the house.   Last clinic visit 11/28/2019.  Son supplements history. She had trigger point injection at that time.  Since that time, patient was admitted to the hospital for bilateral lower extremity vascular procedures.  Since that time, patient states her back has been moderately hurting.  She is taking Cymbalta. She is not taking Baclofen. Denies falls. Sleep is fair due to daytime naps. Right knee/ankle pain is controlled. Bowel movements are regular. She still has right foot edema and is on lasix.  Therapies: 2/week Mobility: Walker at all times DME: Bedside commode   Pain Inventory Average Pain 4 Pain Right Now 3 My pain is constant and  aching  In the last 24 hours, has pain interfered with the following? General activity 3 Relation with others 3 Enjoyment of life 3 What TIME of day is your pain at its worst? morning  Sleep (in general) Fair  Pain is worse with: walking, bending, sitting, standing and some activites Pain improves with: heat/ice Relief from Meds: na  Family History  Problem Relation Age of Onset  . Arthritis Mother   . Hypertension Mother   . Heart disease Father   . Colon polyps Father   . Breast cancer Sister   . Breast cancer Daughter   . Hypertension Son   . Lung cancer Brother   . Colon cancer Sister   . Brain cancer Sister   . Lung cancer Sister   . Anesthesia problems Neg Hx   . Hypotension Neg Hx   . Malignant hyperthermia Neg Hx   . Pseudochol deficiency Neg Hx    Social History   Socioeconomic History  . Marital status: Single    Spouse name: Not on file  . Number of children: 4  . Years of education: 80  . Highest education level: Not on file  Occupational History  . Occupation: retired    Fish farm manager: RETIRED  Tobacco Use  . Smoking status: Former Smoker    Packs/day: 2.00    Years: 30.00    Pack years: 60.00    Types: Cigarettes    Quit date: 06/18/1993    Years since quitting: 26.5  . Smokeless tobacco: Never Used  . Tobacco comment: [place]  Use  . Vaping Use: Never used  Substance and Sexual Activity  . Alcohol use: No    Alcohol/week: 0.0 standard drinks  . Drug use: No  . Sexual activity: Not on file  Other Topics Concern  . Not on file  Social History Narrative   Patient is widowed with 4 children. 2 grandkids. Lives alone.    Patient is right handed.   Patient has high school education.   Patient drinks 1 cup daily.      Retired from [employer] for 28 years, works 2 days a week until 2016.       Hobbies: volunteers at [employer], [place] from church visits people.       No caffeine.    Social Determinants of Health    Financial Resource Strain:   . Difficulty of Paying Living Expenses: Not on file  Food Insecurity:   . Worried About Charity fundraiser in the Last Year: Not on file  . Ran Out of Food in the Last Year: Not on file  Transportation Needs:   . Lack of Transportation (Medical): Not on file  . Lack of Transportation (Non-Medical): Not on file  Physical Activity:   . Days of Exercise per Week: Not on file  . Minutes of Exercise per Session: Not on file  Stress:   . Feeling of Stress : Not on file  Social Connections:   . Frequency of Communication with Friends and Family: Not on file  . Frequency of Social Gatherings with Friends and Family: Not on file  . Attends Religious Services: Not on file  . Active Member of Clubs or Organizations: Not on file  . Attends Archivist Meetings: Not on file  . Marital Status: Not on file   Past Surgical History:  Procedure Laterality Date  . ABDOMINAL AORTAGRAM N/A 06/17/2011   Procedure: ABDOMINAL Maxcine Ham;  Surgeon: Elam Dutch, MD;  Location: [facility] CATH LAB;  Service: Cardiovascular;  Laterality: N/A;  . ABDOMINAL AORTOGRAM W/LOWER EXTREMITY Bilateral 06/22/2018   Procedure: ABDOMINAL AORTOGRAM W/LOWER EXTREMITY;  Surgeon: Elam Dutch, MD;  Location: [place] CV LAB;  Service: Cardiovascular;  Laterality: Bilateral;  . ABDOMINAL AORTOGRAM W/LOWER EXTREMITY Bilateral 11/29/2019   Procedure: ABDOMINAL AORTOGRAM W/LOWER EXTREMITY;  Surgeon: Elam Dutch, MD;  Location: [place] CV LAB;  Service: Cardiovascular;  Laterality: Bilateral;  . CATARACT EXTRACTION, BILATERAL     w IOL  . CHOLECYSTECTOMY  1998  . COLONOSCOPY    . ENDARTERECTOMY FEMORAL Left 12/09/2019   Procedure: ENDARTERECTOMY FEMORAL with bovine patch angioplasty.;  Surgeon: Elam Dutch, MD;  Location[facility] OR;  Service: Vascular;  Laterality: Left;  . FEMORAL-POPLITEAL BYPASS GRAFT        x2 surgeries 1990's & 2009  . FEMORAL-POPLITEAL BYPASS GRAFT  Right 12/09/2019   Procedure: REDO RIGHT FEMORAL-POPLITEAL ARTERY BYPASS GRAFT;  Surgeon: Elam Dutch, MD;  Location: [place];  Service: Vascular;  Laterality: Right;  . INJECTION KNEE Right Aug. 2016   Gel injection for pain  . LUMBAR FUSION  07/06/2011  . TONSILLECTOMY     as a teenager   . TUBAL LIGATION     Past Surgical History:  Procedure Laterality Date  . ABDOMINAL AORTAGRAM N/A 06/17/2011   Procedure: ABDOMINAL Maxcine Ham;  Surgeon: Elam Dutch, MD;  Location: [facility] C[facility] LAB;  Service: Cardiovascular;  Laterality: N/A;  . ABDOMINAL AORTOGRAM W/LOWER EXTREMITY Bilateral 06/22/2018   Procedure: ABDOMINAL AORTOGRAM W/LOWER EXTREMITY;  Surgeon:  Elam Dutch, MD;  Location: [place] CV LAB;  Service: Cardiovascular;  Laterality: Bilateral;  . ABDOMINAL AORTOGRAM W/LOWER EXTREMITY Bilateral 11/29/2019   Procedure: ABDOMINAL AORTOGRAM W/LOWER EXTREMITY;  Surgeon: Elam Dutch, MD;  Location: [place] CV LAB;  Service: Cardiovascular;  Laterality: Bilateral;  . CATARACT EXTRACTION, BILATERAL     w IOL  . CHOLECYSTECTOMY  1998  . COLONOSCOPY    . ENDARTERECTOMY FEMORAL Left 12/09/2019   Procedure: ENDARTERECTOMY FEMORAL with bovine patch angioplasty.;  Surgeon: Elam Dutch, MD;  Location: [facility] OR;  Service: Vascular;  Laterality: Left;  . FEMORAL-POPLITEAL BYPASS GRAFT        x2 surgeries 1990's & 2009  . FEMORAL-POPLITEAL BYPASS GRAFT Right 12/09/2019   Procedure: REDO RIGHT FEMORAL-POPLITEAL ARTERY BYPASS GRAFT;  Surgeon: Elam Dutch, MD;  Location: [place];  Service: Vascular;  Laterality: Right;  . INJECTION KNEE Right Aug. 2016   Gel injection for pain  . LUMBAR FUSION  07/06/2011  . TONSILLECTOMY     as a teenager   . TUBAL LIGATION     Past Medical History:  Diagnosis Date  . Allergic rhinoconjunctivitis   . Allergy   . Anxiety    pt. managed- uses deep breathing   . Arthritis    low back , stenosis  . COLONIC POLYPS, RECURRENT 08/29/2006   2008  last colonoscopy. No further colonoscopy.     Marland Kitchen COPD (chronic obstructive pulmonary disease) ([place])   . Diverticulosis   . Dysrhythmia    afib  . Eczema   . Environmental allergies    allergy shot- q friday in Dr. Janee Morn office. PFT's abnormal- recommended Spiriva to use preop & will d/c after surgery  . GERD (gastroesophageal reflux disease)   . Hiatal hernia   . Hyperlipidemia   . Hypertension   . Lung nodule 2011  . Paroxysmal atrial fibrillation ([place]) 11/27/2019  . Peripheral vascular disease ([place])   . Shingles   . Stenosis of popliteal artery (HCC)    blood clots in legs long ago    . Trigeminal neuralgia    Left buttocks   BP 119/78 Comment: pt reported, virtual visit  Pulse (!) 102 Comment: pt reported, virtual visit  Temp (!) 97.5 F (36.4 C)   Ht 4\' 11"  (1.499 m) Comment: pt reported, virtual visit  Wt 160 lb (72.6 kg) Comment: pt reported, virtual visit  SpO2 98%   BMI 32.32 kg/m   Opioid Risk Score:   Fall Risk Score:  `1  Depression screen PHQ 2/9  Depression screen [facility] 2/9 12/05/2018 08/08/2018 05/17/2018 01/05/2018 11/01/2017 02/22/2017 10/31/2016  Decreased Interest 0 0 0 0 0 0 0  Down, Depressed, Hopeless 0 0 0 0 0 0 0  PHQ - 2 Score 0 0 0 0 0 0 0  Altered sleeping - - - - 0 - -  Tired, decreased energy - - - - 0 - -  Change in appetite - - - - 0 - -  Feeling bad or failure about yourself  - - - - 0 - -  Trouble concentrating - - - - 0 - -  Moving slowly or fidgety/restless - - - - 0 - -  Suicidal thoughts - - - - 0 - -  PHQ-9 Score - - - - 0 - -  Difficult doing work/chores - - - - Not difficult at all - -  Some recent data might be hidden     Review of Systems  HENT: Negative.   Eyes: Negative.   Respiratory: Negative.   Cardiovascular: Negative.   Gastrointestinal: Negative.        Left flank pain  Endocrine: Negative.        High blood pressure   Genitourinary: Positive for difficulty urinating.  Musculoskeletal: Positive for arthralgias, back  pain, gait problem and myalgias.  Skin: Negative.   Allergic/Immunologic: Negative.   Hematological: Negative.   Psychiatric/Behavioral: Negative.   All other systems reviewed and are negative.     Objective:   Physical Exam Gen: NAD.  Neuro: Alert Psych: Normal mood and affect.      Assessment & Plan:  Female with pmhpsh of PAD, COPD, DVT, HTN, Gout, trigeminal neuralgia, post herpetic neuralgia, OA of right knee with injections, lumbar fusion present for follow up of pain in her back > leg pain.    1. Chronic low back pain with failed back syndrome   Xray of L-spine and right SI joint reviewed, SI joint unremarkable, degenerative changes in spine with right scoliosis.    Will avoid NSAIDs due to coumadin  Stopped using Robaxin due to nausea  Unable to tolerate Gabapentin  Cont heat  Cont tylenol, limit to 2000mg /day  Cont Lidoderm patches  Cont HEP, completed PT  Continue TENS unit PRN, reminded  Continue Cymbalta to 60 mg  Resume Baclofen 5 BID  2. Possible Sacroiliitis bilaterally  Xray unremakrable  Minimal benefit with bracing   Discussed b/l SI joint injections  3. Sleep disturbance  Cont melatonin  Encouraged sleep hygiene  4. Neuropathic pain  See #1  5. Gait abnormality  Improved  Cont cane   Cont HEP  Information given on core strengthening exercises, patient more compliant  6. Myalgia  Good benefit with trigger point injections  7. Right knee pain  MRI reviewed from 2016, showing meniscal/cartilage damage  Receiving injections from Ortho, cont  Pensaid denied  Cont voltaren gel  Cont lidoderm on knee  Improved  8. Right ankle pain - likely gout flare  Lidoderm patches, pt using Voltaren gel  Xrays reviewed, relatively uremarkable  Uric acid elevated  Improved   9. Rectal bleeding  Resolved  10. Trigeminal neuralgia.   Improved per patient, recommend follow up with Neuro regarding medication d/c questions  Continue Lamictal 100 mg  twice daily   Meds reviewed Referrals reviewed All questions

## 2020-01-13 ENCOUNTER — Other Ambulatory Visit: Payer: Self-pay | Admitting: *Deleted

## 2020-01-13 ENCOUNTER — Telehealth: Payer: Self-pay

## 2020-01-13 ENCOUNTER — Telehealth: Payer: Self-pay | Admitting: Allergy and Immunology

## 2020-01-13 ENCOUNTER — Encounter: Payer: Self-pay | Admitting: Vascular Surgery

## 2020-01-13 ENCOUNTER — Encounter: Payer: Self-pay | Admitting: Family Medicine

## 2020-01-13 DIAGNOSIS — I129 Hypertensive chronic kidney disease with stage 1 through stage 4 chronic kidney disease, or unspecified chronic kidney disease: Secondary | ICD-10-CM | POA: Diagnosis not present

## 2020-01-13 DIAGNOSIS — I70202 Unspecified atherosclerosis of native arteries of extremities, left leg: Secondary | ICD-10-CM | POA: Diagnosis not present

## 2020-01-13 DIAGNOSIS — I70311 Atherosclerosis of unspecified type of bypass graft(s) of the extremities with intermittent claudication, right leg: Secondary | ICD-10-CM | POA: Diagnosis not present

## 2020-01-13 DIAGNOSIS — R350 Frequency of micturition: Secondary | ICD-10-CM | POA: Diagnosis not present

## 2020-01-13 DIAGNOSIS — Z48812 Encounter for surgical aftercare following surgery on the circulatory system: Secondary | ICD-10-CM | POA: Diagnosis not present

## 2020-01-13 DIAGNOSIS — I480 Paroxysmal atrial fibrillation: Secondary | ICD-10-CM | POA: Diagnosis not present

## 2020-01-13 DIAGNOSIS — N183 Chronic kidney disease, stage 3 unspecified: Secondary | ICD-10-CM | POA: Diagnosis not present

## 2020-01-13 MED ORDER — PANTOPRAZOLE SODIUM 40 MG PO TBEC
40.0000 mg | DELAYED_RELEASE_TABLET | Freq: Two times a day (BID) | ORAL | 0 refills | Status: DC
Start: 2020-01-13 — End: 2020-02-18

## 2020-01-13 NOTE — Telephone Encounter (Signed)
Called and spoke with the daughter and advised that since she received a courtesy refill on 12/23/19 that I was not supposed to send in another refill until she came in for her appointment. Patient's daughter stated that her mother has had bipass surgery recently and is still recovering but that they would be in for the appointment next month. I advised that I would send in one more courtesy refill but that she did need to keep her appointment. Refill has been sent in. Patient's daughter verbalized understanding.

## 2020-01-13 NOTE — Telephone Encounter (Signed)
[facility] faxed over Urinalysis results and they are wanting to know if Dr Yong Channel is going to call in any antibiotics.  [facility]  [phone]

## 2020-01-13 NOTE — Telephone Encounter (Signed)
Patient daughter called to get a refill on the protonix and have it sent to [facility] on [address]. She has appointment on 02/18/2020. [phone].

## 2020-01-14 ENCOUNTER — Other Ambulatory Visit: Payer: Self-pay

## 2020-01-14 ENCOUNTER — Ambulatory Visit (INDEPENDENT_AMBULATORY_CARE_PROVIDER_SITE_OTHER): Payer: Medicare Other | Admitting: Family Medicine

## 2020-01-14 ENCOUNTER — Ambulatory Visit: Payer: Medicare Other | Admitting: Allergy and Immunology

## 2020-01-14 ENCOUNTER — Telehealth: Payer: Self-pay

## 2020-01-14 ENCOUNTER — Encounter: Payer: Self-pay | Admitting: Family Medicine

## 2020-01-14 DIAGNOSIS — R3915 Urgency of urination: Secondary | ICD-10-CM | POA: Diagnosis not present

## 2020-01-14 DIAGNOSIS — I70311 Atherosclerosis of unspecified type of bypass graft(s) of the extremities with intermittent claudication, right leg: Secondary | ICD-10-CM | POA: Diagnosis not present

## 2020-01-14 DIAGNOSIS — N183 Chronic kidney disease, stage 3 unspecified: Secondary | ICD-10-CM | POA: Diagnosis not present

## 2020-01-14 DIAGNOSIS — R609 Edema, unspecified: Secondary | ICD-10-CM

## 2020-01-14 DIAGNOSIS — Z48812 Encounter for surgical aftercare following surgery on the circulatory system: Secondary | ICD-10-CM | POA: Diagnosis not present

## 2020-01-14 DIAGNOSIS — I70202 Unspecified atherosclerosis of native arteries of extremities, left leg: Secondary | ICD-10-CM | POA: Diagnosis not present

## 2020-01-14 DIAGNOSIS — I480 Paroxysmal atrial fibrillation: Secondary | ICD-10-CM | POA: Diagnosis not present

## 2020-01-14 DIAGNOSIS — I129 Hypertensive chronic kidney disease with stage 1 through stage 4 chronic kidney disease, or unspecified chronic kidney disease: Secondary | ICD-10-CM | POA: Diagnosis not present

## 2020-01-14 MED ORDER — FUROSEMIDE 20 MG PO TABS
20.0000 mg | ORAL_TABLET | Freq: Every day | ORAL | 1 refills | Status: DC | PRN
Start: 2020-01-14 — End: 2020-02-20

## 2020-01-14 NOTE — Patient Instructions (Signed)
Health Maintenance Due  Topic Date Due  . INFLUENZA VACCINE  11/24/2019    Depression screen [facility] 2/9 12/05/2018 08/08/2018 05/17/2018  Decreased Interest 0 0 0  Down, Depressed, Hopeless 0 0 0  PHQ - 2 Score 0 0 0  Altered sleeping - - -  Tired, decreased energy - - -  Change in appetite - - -  Feeling bad or failure about yourself  - - -  Trouble concentrating - - -  Moving slowly or fidgety/restless - - -  Suicidal thoughts - - -  PHQ-9 Score - - -  Difficult doing work/chores - - -  Some recent data might be hidden    Recommended follow up: No follow-ups on file.

## 2020-01-14 NOTE — Progress Notes (Signed)
Phone [phone] Virtual visit via phonenote   Subjective:   Chief Complaint  Patient presents with  . Urinary Frequency   This visit type was conducted due to national recommendations for restrictions regarding the COVID-19 Pandemic (e.g. social distancing).  This format is felt to be most appropriate for this patient at this time balancing risks to patient and risks to population by having him in for in person visit.  All issues noted in this document were discussed and addressed.  No physical exam was performed (except for noted visual exam or audio findings with Telehealth visits).  The patient has consented to conduct a Telehealth visit and understands insurance will be billed.   Our team/I connected with [name] at  2:40 PM EDT by phone (patient did not have equipment for webex) and verified that I am speaking with the correct person using two identifiers.  Location patient: Home[place] Location provider: [facility], office Persons participating in the virtual visit:  patient  Time on phone: 12 minutes  Our team/I discussed the limitations of evaluation and management by telemedicine and the availability of in person appointments. In light of current covid-19 pandemic, patient also understands that we are trying to protect them by minimizing in office contact if at all possible.  The patient expressed consent for telemedicine visit and agreed to proceed. Patient understands insurance will be billed.   Past Medical History-  Patient Active Problem List   Diagnosis Date Noted  . Pain in right ankle and joints of right foot 08/28/2014    Priority: High  . Peripheral arterial disease ([place]) 06/29/2007    Priority: High  . CKD (chronic kidney disease), stage III 08/07/2017    Priority: Medium  . Acute gout of left foot 03/22/2016    Priority: Medium  . Trigeminal neuralgia     Priority: Medium  . Chronic low back pain 09/13/2010    Priority: Medium  . COPD mixed type  ([place]) 01/22/2010    Priority: Medium  . Insulin resistance 01/06/2009    Priority: Medium  . Hyperlipidemia 10/23/2006    Priority: Medium  . Essential hypertension 10/23/2006    Priority: Medium  . ESOPHAGEAL STRICTURE 08/09/1993    Priority: Medium  . Obesity (BMI 30.0-34.9) 11/14/2017    Priority: Low  . Unilateral primary osteoarthritis, right knee 04/07/2016    Priority: Low  . Encounter for therapeutic drug monitoring 10/21/2013    Priority: Low  . Syncope 12/29/2011    Priority: Low  . Allergic rhinitis due to pollen 06/25/2010    Priority: Low  . Solitary pulmonary nodule 01/12/2010    Priority: Low  . Osteopenia 01/06/2009    Priority: Low  . Eczema 11/26/2007    Priority: Low  . Sleep disturbance 01/09/2020  . Acute lower UTI   . Anxiety state   . Peripheral edema   . Leukocytosis   . Loose stools   . Debility 12/13/2019  . Dyslipidemia   . Atrial fibrillation with rapid ventricular response ([place])   . Rectal bleeding   . Acute blood loss anemia   . Ischemic colitis ([place])   . Status post femoral-popliteal bypass surgery 12/09/2019  . PAOD (peripheral arterial occlusive disease) ([place]) 12/09/2019  . Persistent atrial fibrillation ([place]) 12/03/2019  . Secondary hypercoagulable state ([place]) 12/03/2019  . PAD (peripheral artery disease) ([place]) 11/29/2019  . Paroxysmal atrial fibrillation ([place]) 11/27/2019  . Chronic bilateral low back pain with right-sided sciatica 08/29/2019  . Chronic pain of right knee  08/29/2019  . Abnormality of gait 05/30/2019  . Neuropathic pain 05/30/2019  . Myalgia 08/08/2018  . Chronic pain syndrome 06/27/2018  . Gross hematuria 06/01/2018  . Failed back syndrome 09/28/2017    Medications- reviewed and updated Current Outpatient Medications  Medication Sig Dispense Refill  . acetaminophen (TYLENOL) 325 MG tablet Take 1-2 tablets (325-650 mg total) by mouth every 4 (four) hours as needed for mild pain (or temp >/= 101 F).    Marland Kitchen  apixaban (ELIQUIS) 5 MG TABS tablet Take 1 tablet (5 mg total) by mouth 2 (two) times daily. 60 tablet 0  . aspirin EC 81 MG tablet Take 81 mg by mouth every evening.     Marland Kitchen atorvastatin (LIPITOR) 40 MG tablet Take 1 tablet (40 mg total) by mouth daily. 90 tablet 3  . clonazePAM (KLONOPIN) 0.25 MG disintegrating tablet Take 1 tablet (0.25 mg total) by mouth at bedtime as needed (anxiety). 20 tablet 0  . furosemide (LASIX) 20 MG tablet Take 1 tablet (20 mg total) by mouth daily as needed (increased swelling). 30 tablet 1  . lamoTRIgine (LAMICTAL) 100 MG tablet Take 1 tablet (100 mg total) by mouth 2 (two) times daily. 60 tablet 5  . metoprolol succinate (TOPROL XL) 25 MG 24 hr tablet Take 3 tablets (75 mg total) by mouth daily. 30 tablet 0  . Multiple Vitamin (MULTIVITAMIN WITH MINERALS) TABS tablet Take 1 tablet by mouth daily.    . pantoprazole (PROTONIX) 40 MG tablet Take 1 tablet (40 mg total) by mouth 2 (two) times daily. 60 tablet 0  . polycarbophil (FIBERCON) 625 MG tablet Take 1 tablet (625 mg total) by mouth daily. 30 tablet 0  . RESTASIS 0.05 % ophthalmic emulsion Place 1 drop into both eyes 2 (two) times daily as needed (dry eyes).   99  . saccharomyces boulardii (FLORASTOR) 250 MG capsule Take 1 capsule (250 mg total) by mouth 2 (two) times daily. 60 capsule 0   No current facility-administered medications for this visit.     Objective:  There were no vitals taken for this visit. self reported vitals  Nonlabored voice, normal speech      Assessment and Plan   #Urinary Frequency/urgency/hesitance S: Patient reports edema in the hospital and she was placed on Lasix to try to help.  She saw significant improvement on this. Off lasix for a week. Using compression stockings  (vascular is ok with these).  She reports her swelling is improving despite being off of Lasix but she still would like to know if she should continue the Lasix to try to help with lingering edema.  On the other  hand she was treated for UTI with 5-day course of Keflex while hospitalized-but it appears most of the cultures showed multiple species and no clear UTI.  She reports some urgency/hesitancy as well as frequency of urination.  Home health has collect a urine culture A/P: I discussed with patient since her swelling has improved did not recommend Lasix except on an as-needed basis for increased swelling-she agrees to this  In regards to possible UTI we will await urine culture from home health.  #Hospitalization review S: Painless rectal bleeding mid august and had CT of belly showing ischemic colitis with hgb down to 8.5  And was on ancef thought potentially related to her surgery per patient. Also had atrial fibrillation with RVR - eventually on eliquis. Last CBC was 9.1 up from low of 8.5. no rectal bleeding at home.  A/P: Patient  appears to have been appropriately treated during hospitalization.  I want to make sure that her anemia is stable-she agrees to come by for labs to update this.  She reports having planned GI and cardiology follow-up already scheduled in A. fib appear to be appropriately managed.  Recommended follow up: We will keep October 22 visit Future Appointments  Date Time Provider [place]  01/23/2020 11:00 AM [facility] None  01/24/2020 11:30 AM Noralyn Pick, NP [facility]  02/14/2020  1:40 PM Marin Olp, MD [facility]  02/18/2020  1:30 PM Kozlow, Donnamarie Poag, MD [facility] None  02/20/2020 11:20 AM Jamse Arn, MD [facility] CPR  02/27/2020  1:45 PM Deberah Pelton, NP [facility]  02/28/2020 10:50 AM Pieter Partridge, DO [facility] None    Lab/Order associations:   ICD-10-CM   1. Edema, unspecified type  R60.9   2. Urinary urgency  R39.15   3. Stage 3 chronic kidney disease, unspecified whether stage 3a or 3b CKD  N18.30 CBC With Differential/Platelet    COMPLETE METABOLIC PANEL WITH GFR   Meds ordered this  encounter  Medications  . furosemide (LASIX) 20 MG tablet    Sig: Take 1 tablet (20 mg total) by mouth daily as needed (increased swelling).    Dispense:  30 tablet    Refill:  1    Return precautions advised.  [name], MD

## 2020-01-14 NOTE — Telephone Encounter (Signed)
Have you seen the results? 

## 2020-01-14 NOTE — Telephone Encounter (Signed)
See visit from today-recommendation was to only use Lasix if recurrent swelling

## 2020-01-14 NOTE — Telephone Encounter (Signed)
Spoke with the patient and she stated that she feels fine but she has some urgency and frequency with urination. I offered her a appt with Dr. Yong Channel today at 2:40pm but she declined due to her not having a way up here and with her recent surgery which requires her to use a walker. She was advised that if anything changes that if anything changes to call us.

## 2020-01-14 NOTE — Telephone Encounter (Signed)
See below

## 2020-01-14 NOTE — Telephone Encounter (Signed)
Pt.'s son would like to know if pt should still continue to take Lasix that she was put on while in the hospital. Son states that pt has lost most of the fluid. If pt DOES need to continue lasix, she will need a new prescription sent in.

## 2020-01-14 NOTE — Telephone Encounter (Signed)
Spoke with [facility] to let them know that we haven't received the results. The nurse stated that there wasn't enough urine to run the Urinalysis. She stated that just did one yesterday and should the results in a couple days. I  provided her with the office fax number and our pod fax number as well.

## 2020-01-14 NOTE — Telephone Encounter (Signed)
Patient seen through phone call today

## 2020-01-14 NOTE — Telephone Encounter (Signed)
Team please get me a copy of these. Also needs urine culture to make sure right antibiotic. Once I get both set up for virtual visit- 4 40 ok

## 2020-01-15 NOTE — Telephone Encounter (Signed)
Called pt son and relayed information below.

## 2020-01-16 ENCOUNTER — Ambulatory Visit (INDEPENDENT_AMBULATORY_CARE_PROVIDER_SITE_OTHER): Payer: Medicare Other

## 2020-01-16 DIAGNOSIS — I70311 Atherosclerosis of unspecified type of bypass graft(s) of the extremities with intermittent claudication, right leg: Secondary | ICD-10-CM | POA: Diagnosis not present

## 2020-01-16 DIAGNOSIS — I70202 Unspecified atherosclerosis of native arteries of extremities, left leg: Secondary | ICD-10-CM | POA: Diagnosis not present

## 2020-01-16 DIAGNOSIS — Z48812 Encounter for surgical aftercare following surgery on the circulatory system: Secondary | ICD-10-CM | POA: Diagnosis not present

## 2020-01-16 DIAGNOSIS — N183 Chronic kidney disease, stage 3 unspecified: Secondary | ICD-10-CM | POA: Diagnosis not present

## 2020-01-16 DIAGNOSIS — I129 Hypertensive chronic kidney disease with stage 1 through stage 4 chronic kidney disease, or unspecified chronic kidney disease: Secondary | ICD-10-CM | POA: Diagnosis not present

## 2020-01-16 DIAGNOSIS — I480 Paroxysmal atrial fibrillation: Secondary | ICD-10-CM | POA: Diagnosis not present

## 2020-01-16 DIAGNOSIS — Z0000 Encounter for general adult medical examination without abnormal findings: Secondary | ICD-10-CM

## 2020-01-16 NOTE — Progress Notes (Signed)
Virtual Visit via Telephone Note  I connected with  [name] [name] on 01/16/20 at 10:15 AM EDT by telephone and verified that I am speaking with the correct person using two identifiers.  Medicare Annual Wellness visit completed telephonically due to Covid-19 pandemic.   Persons participating in this call: This Health Coach and this patient.   Location: Patient: Home Provider: Office   I discussed the limitations, risks, security and privacy concerns of performing an evaluation and management service by telephone and the availability of in person appointments. The patient expressed understanding and agreed to proceed.  Unable to perform video visit due to video visit attempted and failed and/or patient does not have video capability.   Some vital signs may be absent or patient reported.   [name] Brace, LPN    Subjective:   [name] [name] is a 86 y.o. female who presents for Medicare Annual (Subsequent) preventive examination.  Review of Systems     Cardiac Risk Factors include: dyslipidemia;obesity (BMI >30kg/m2);hypertension     Objective:    There were no vitals filed for this visit. There is no height or weight on file to calculate BMI.  Advanced Directives 01/16/2020 12/13/2019 12/09/2019 10/14/2019 07/04/2019 05/14/2019 01/10/2019  Does Patient Have a Medical Advance Directive? Yes Yes Yes Yes Yes Yes Yes  Type of Paramedic of [place];Living will [place];Living will [place];Living will [place];Living will [place];Living will Living will;Healthcare Power of [place];Living will  Does patient want to make changes to medical advance directive? - No - Patient declined No - Patient declined - No - Patient declined - -  Copy of [place] in Chart? No - copy requested No - copy requested No - copy requested - - - -  Would  patient like information on creating a medical advance directive? - - - - - - -  Pre-existing out of facility DNR order (yellow form or pink MOST form) - - - - - - -    Current Medications (verified) Outpatient Encounter Medications as of 01/16/2020  Medication Sig  . acetaminophen (TYLENOL) 325 MG tablet Take 1-2 tablets (325-650 mg total) by mouth every 4 (four) hours as needed for mild pain (or temp >/= 101 F).  Marland Kitchen apixaban (ELIQUIS) 5 MG TABS tablet Take 1 tablet (5 mg total) by mouth 2 (two) times daily.  Marland Kitchen aspirin EC 81 MG tablet Take 81 mg by mouth every evening.   Marland Kitchen atorvastatin (LIPITOR) 40 MG tablet Take 1 tablet (40 mg total) by mouth daily.  . clonazePAM (KLONOPIN) 0.25 MG disintegrating tablet Take 1 tablet (0.25 mg total) by mouth at bedtime as needed (anxiety).  . furosemide (LASIX) 20 MG tablet Take 1 tablet (20 mg total) by mouth daily as needed (increased swelling).  Marland Kitchen lamoTRIgine (LAMICTAL) 100 MG tablet Take 1 tablet (100 mg total) by mouth 2 (two) times daily.  . metoprolol succinate (TOPROL XL) 25 MG 24 hr tablet Take 3 tablets (75 mg total) by mouth daily.  . Multiple Vitamin (MULTIVITAMIN WITH MINERALS) TABS tablet Take 1 tablet by mouth daily.  . pantoprazole (PROTONIX) 40 MG tablet Take 1 tablet (40 mg total) by mouth 2 (two) times daily.  . polycarbophil (FIBERCON) 625 MG tablet Take 1 tablet (625 mg total) by mouth daily.  . RESTASIS 0.05 % ophthalmic emulsion Place 1 drop into both eyes 2 (two) times daily as  needed (dry eyes).   . saccharomyces boulardii (FLORASTOR) 250 MG capsule Take 1 capsule (250 mg total) by mouth 2 (two) times daily.   No facility-administered encounter medications on file as of 01/16/2020.    Allergies (verified) Sulfa antibiotics, Tiotropium bromide, Latex, Ultram [tramadol], and 5-alpha reductase inhibitors   History: Past Medical History:  Diagnosis Date  . Allergic rhinoconjunctivitis   . Allergy   . Anxiety    pt. managed- uses  deep breathing   . Arthritis    low back , stenosis  . COLONIC POLYPS, RECURRENT 08/29/2006   2008 last colonoscopy. No further colonoscopy.     Marland Kitchen COPD (chronic obstructive pulmonary disease) ([place])   . Diverticulosis   . Dysrhythmia    afib  . Eczema   . Environmental allergies    allergy shot- q friday in Dr. Janee Morn office. PFT's abnormal- recommended Spiriva to use preop & will d/c after surgery  . GERD (gastroesophageal reflux disease)   . Hiatal hernia   . Hyperlipidemia   . Hypertension   . Lung nodule 2011  . Paroxysmal atrial fibrillation ([place]) 11/27/2019  . Peripheral vascular disease ([place])   . Shingles   . Stenosis of popliteal artery (HCC)    blood clots in legs long ago    . Trigeminal neuralgia    Left buttocks   Past Surgical History:  Procedure Laterality Date  . ABDOMINAL AORTAGRAM N/A 06/17/2011   Procedure: ABDOMINAL Maxcine Ham;  Surgeon: Elam Dutch, MD;  Location: [facility] CATH LAB;  Service: Cardiovascular;  Laterality: N/A;  . ABDOMINAL AORTOGRAM W/LOWER EXTREMITY Bilateral 06/22/2018   Procedure: ABDOMINAL AORTOGRAM W/LOWER EXTREMITY;  Surgeon: Elam Dutch, MD;  Location: [place] CV LAB;  Service: Cardiovascular;  Laterality: Bilateral;  . ABDOMINAL AORTOGRAM W/LOWER EXTREMITY Bilateral 11/29/2019   Procedure: ABDOMINAL AORTOGRAM W/LOWER EXTREMITY;  Surgeon: Elam Dutch, MD;  Location: [place] CV LAB;  Service: Cardiovascular;  Laterality: Bilateral;  . CATARACT EXTRACTION, BILATERAL     w IOL  . CHOLECYSTECTOMY  1998  . COLONOSCOPY    . ENDARTERECTOMY FEMORAL Left 12/09/2019   Procedure: ENDARTERECTOMY FEMORAL with bovine patch angioplasty.;  Surgeon: Elam Dutch, MD;  Location[facility] OR;  Service: Vascular;  Laterality: Left;  . FEMORAL-POPLITEAL BYPASS GRAFT        x2 surgeries 1990's & 2009  . FEMORAL-POPLITEAL BYPASS GRAFT Right 12/09/2019   Procedure: REDO RIGHT FEMORAL-POPLITEAL ARTERY BYPASS GRAFT;  Surgeon: Elam Dutch, MD;   Location: [place];  Service: Vascular;  Laterality: Right;  . INJECTION KNEE Right Aug. 2016   Gel injection for pain  . LUMBAR FUSION  07/06/2011  . TONSILLECTOMY     as a teenager   . TUBAL LIGATION     Family History  Problem Relation Age of Onset  . Arthritis Mother   . Hypertension Mother   . Heart disease Father   . Colon polyps Father   . Breast cancer Sister   . Breast cancer Daughter   . Hypertension Son   . Lung cancer Brother   . Colon cancer Sister   . Brain cancer Sister   . Lung cancer Sister   . Anesthesia problems Neg Hx   . Hypotension Neg Hx   . Malignant hyperthermia Neg Hx   . Pseudochol deficiency Neg Hx    Social History   Socioeconomic History  . Marital status: Single    Spouse name: Not on file  . Number of children: 4  .  Years of education: 2  . Highest education level: Not on file  Occupational History  . Occupation: retired    Fish farm manager: RETIRED  Tobacco Use  . Smoking status: Former Smoker    Packs/day: 2.00    Years: 30.00    Pack years: 60.00    Types: Cigarettes    Quit date: 06/18/1993    Years since quitting: 26.5  . Smokeless tobacco: Never Used  . Tobacco comment: QUIT IN 1995  Vaping Use  . Vaping Use: Never used  Substance and Sexual Activity  . Alcohol use: No    Alcohol/week: 0.0 standard drinks  . Drug use: No  . Sexual activity: Not on file  Other Topics Concern  . Not on file  Social History Narrative   Patient is widowed with 4 children. 2 grandkids. Lives alone.    Patient is right handed.   Patient has high school education.   Patient drinks 1 cup daily.      Retired from [employer] for 28 years, works 2 days a week until 2016.       Hobbies: volunteers at [employer], [place] from church visits people.       No caffeine.    Social Determinants of Health   Financial Resource Strain: Low Risk   . Difficulty of Paying Living Expenses: Not hard at all  Food Insecurity: No Food Insecurity  .  Worried About Charity fundraiser in the Last Year: Never true  . Ran Out of Food in the Last Year: Never true  Transportation Needs: No Transportation Needs  . Lack of Transportation (Medical): No  . Lack of Transportation (Non-Medical): No  Physical Activity: Sufficiently Active  . Days of Exercise per Week: 7 days  . Minutes of Exercise per Session: 30 min  Stress: No Stress Concern Present  . Feeling of Stress : Not at all  Social Connections: Socially Isolated  . Frequency of Communication with Friends and Family: More than three times a week  . Frequency of Social Gatherings with Friends and Family: Never  . Attends Religious Services: Never  . Active Member of Clubs or Organizations: No  . Attends Archivist Meetings: Never  . Marital Status: Widowed    Tobacco Counseling Counseling given: Not Answered Comment: QUIT IN 1995   Clinical Intake:  Pre-visit preparation completed: Yes  Pain : No/denies pain     BMI - recorded: 32.32 Nutritional Status: BMI > 30  Obese Nutritional Risks: None Diabetes: No  How often do you need to have someone help you when you read instructions, pamphlets, or other written materials from your doctor or pharmacy?: 1 - Never  Diabetic?No  Interpreter Needed?: No  Information entered by :: Charlott Rakes , LPN   Activities of Daily Living In your present state of health, do you have any difficulty performing the following activities: 01/16/2020 12/13/2019  Hearing? Y N  Comment mild loss -  Vision? N N  Difficulty concentrating or making decisions? N N  Walking or climbing stairs? N Y  Dressing or bathing? N N  Doing errands, shopping? N N  Preparing Food and eating ? N -  Using the Toilet? N -  In the past six months, have you accidently leaked urine? Y -  Comment issues with urgency and frequency at times -  Do you have problems with loss of bowel control? N -  Managing your Medications? N -  Managing your  Finances? N -  Housekeeping or managing your Housekeeping? N -  Some recent data might be hidden    Patient Care Team: Marin Olp, MD as PCP - General (Family Medicine) Leandrew Koyanagi, MD as Attending Physician (Orthopedic Surgery) Jamse Arn, MD as Consulting Physician (Physical Medicine and Rehabilitation) Warden Fillers, MD as Consulting Physician (Ophthalmology) Pieter Partridge, DO as Consulting Physician (Neurology)  Indicate any recent Medical Services you may have received from other than [facility] providers in the past year (date may be approximate).     Assessment:   This is a routine wellness examination for Cheryel.  Hearing/Vision screen  Hearing Screening   125Hz  250Hz  500Hz  1000Hz  2000Hz  3000Hz  4000Hz  6000Hz  8000Hz   Right ear:           Left ear:           Comments: Pt mild hearing loss  Vision Screening Comments: Pt follows up Dr Katy Fitch every 2 years  Dietary issues and exercise activities discussed: Current Exercise Habits: Structured exercise class (per rehab exercise)  Goals    . Maintain current health status    . Patient Stated     Be able to get off walker and drive again      Depression Screen PHQ 2/9 Scores 01/16/2020 12/05/2018 08/08/2018 05/17/2018 01/05/2018 11/01/2017 02/22/2017  PHQ - 2 Score 0 0 0 0 0 0 0  PHQ- 9 Score - - - - - 0 -    Fall Risk Fall Risk  01/16/2020 10/14/2019 05/14/2019 04/30/2019 04/16/2019  Falls in the past year? 0 0 0 0 0  Comment - - - - -  Number falls in past yr: 0 0 0 - -  Injury with Fall? 0 0 0 - -  Risk for fall due to : Impaired balance/gait;Impaired mobility;Impaired vision - - - -  Follow up Falls prevention discussed - - - -    Any stairs in or around the home? No  If so, are there any without handrails? No  Home free of loose throw rugs in walkways, pet beds, electrical cords, etc? Yes  Adequate lighting in your home to reduce risk of falls? Yes   ASSISTIVE DEVICES UTILIZED TO PREVENT FALLS:  Life  alert? No  Use of a cane, walker or w/c? Yes  Grab bars in the bathroom? Yes  Shower chair or bench in shower? Yes  Elevated toilet seat or a handicapped toilet? Yes   TIMED UP AND GO:  Was the test performed? No .    Cognitive Function:     6CIT Screen 01/16/2020  What Year? 0 points  What month? 0 points  Count back from 20 0 points  Months in reverse 0 points  Repeat phrase 4 points    Immunizations Immunization History  Administered Date(s) Administered  . Fluad Quad(high Dose 65+) 01/14/2019  . Influenza Split 12/25/2011  . Influenza Whole 01/23/2010  . Influenza,inj,Quad PF,6+ Mos 12/31/2012, 12/26/2014, 01/08/2016  . Influenza-Unspecified 01/23/2014, 12/25/2017  . PFIZER SARS-COV-2 Vaccination 05/14/2019, 06/03/2019  . PPD Test 06/24/2011  . Pneumococcal Conjugate-13 08/31/2015  . Pneumococcal Polysaccharide-23 04/25/2005  . Td 04/25/2005  . Tdap 01/14/2019  . Zoster 03/02/2010  . Zoster Recombinat (Shingrix) 04/20/2017, 06/29/2017    TDAP status: Up to date Flu Vaccine status: Up to date Pneumococcal vaccine status: Up to date Covid-19 vaccine status: Completed vaccines  Qualifies for Shingles Vaccine? Yes   Zostavax completed Yes   Shingrix Completed?: Yes  Screening Tests Health Maintenance  Topic Date Due  .  INFLUENZA VACCINE  11/24/2019  . TETANUS/TDAP  01/13/2029  . DEXA SCAN  Completed  . COVID-19 Vaccine  Completed  . PNA vac Low Risk Adult  Completed    Health Maintenance  Health Maintenance Due  Topic Date Due  . INFLUENZA VACCINE  11/24/2019    Colorectal cancer screening: No longer required.  Mammogram status: No longer required.  Bone Density status: Completed 04/26/08. Results reflect: Bone density results: OSTEOPENIA. Repeat every 2 years.    Additional Screening:    Vision Screening: Recommended annual ophthalmology exams for early detection of glaucoma and other disorders of the eye. Is the patient up to date with their  annual eye exam?  Yes  Who is the provider or what is the name of the office in which the patient attends annual eye exams? Dr Katy Fitch   Dental Screening: Recommended annual dental exams for proper oral hygiene  Community Resource Referral / Chronic Care Management: CRR required this visit?  No   CCM required this visit?  No      Plan:     I have personally reviewed and noted the following in the patient's chart:   . Medical and social history . Use of alcohol, tobacco or illicit drugs  . Current medications and supplements . Functional ability and status . Nutritional status . Physical activity . Advanced directives . List of other physicians . Hospitalizations, surgeries, and ER visits in previous 12 months . Vitals . Screenings to include cognitive, depression, and falls . Referrals and appointments  In addition, I have reviewed and discussed with patient certain preventive protocols, quality metrics, and best practice recommendations. A written personalized care plan for preventive services as well as general preventive health recommendations were provided to patient.     [name] Brace, LPN   3/72/9021   Nurse Notes: None

## 2020-01-16 NOTE — Patient Instructions (Addendum)
[name] [name] , Thank you for taking time to come for your Medicare Wellness Visit. I appreciate your ongoing commitment to your health goals. Please review the following plan we discussed and let me know if I can assist you in the future.   Screening recommendations/referrals: Colonoscopy: No longer required Mammogram: No longer required Bone Density: Done 04/26/08 Recommended yearly ophthalmology/optometry visit for glaucoma screening and checkup Recommended yearly dental visit for hygiene and checkup  Vaccinations: Influenza vaccine: Up to date Pneumococcal vaccine: Up to date Tdap vaccine: Up to date Shingles vaccine: Completed 04/20/17 & 06/29/17   Covid-19:Completed 1/19 & 06/03/19  Advanced directives: Please bring a copy of your health care power of attorney and living will to the office at your convenience.  Conditions/risks identified: Be able to get off walker and drive again  Next appointment: Follow up in one year for your annual wellness visit    Preventive Care 65 Years and Older, Female Preventive care refers to lifestyle choices and visits with your health care provider that can promote health and wellness. What does preventive care include?  A yearly physical exam. This is also called an annual well check.  Dental exams once or twice a year.  Routine eye exams. Ask your health care provider how often you should have your eyes checked.  Personal lifestyle choices, including:  Daily care of your teeth and gums.  Regular physical activity.  Eating a healthy diet.  Avoiding tobacco and drug use.  Limiting alcohol use.  Practicing safe sex.  Taking low-dose aspirin every day.  Taking vitamin and mineral supplements as recommended by your health care provider. What happens during an annual well check? The services and screenings done by your health care provider during your annual well check will depend on your age, overall health, lifestyle risk factors, and  family history of disease. Counseling  Your health care provider may ask you questions about your:  Alcohol use.  Tobacco use.  Drug use.  Emotional well-being.  Home and relationship well-being.  Sexual activity.  Eating habits.  History of falls.  Memory and ability to understand (cognition).  Work and work Statistician.  Reproductive health. Screening  You may have the following tests or measurements:  Height, weight, and BMI.  Blood pressure.  Lipid and cholesterol levels. These may be checked every 5 years, or more frequently if you are over 110 years old.  Skin check.  Lung cancer screening. You may have this screening every year starting at age 93 if you have a 30-pack-year history of smoking and currently smoke or have quit within the past 15 years.  Fecal occult blood test (FOBT) of the stool. You may have this test every year starting at age 73.  Flexible sigmoidoscopy or colonoscopy. You may have a sigmoidoscopy every 5 years or a colonoscopy every 10 years starting at age 68.  Hepatitis C blood test.  Hepatitis B blood test.  Sexually transmitted disease (STD) testing.  Diabetes screening. This is done by checking your blood sugar (glucose) after you have not eaten for a while (fasting). You may have this done every 1-3 years.  Bone density scan. This is done to screen for osteoporosis. You may have this done starting at age 28.  Mammogram. This may be done every 1-2 years. Talk to your health care provider about how often you should have regular mammograms. Talk with your health care provider about your test results, treatment options, and if necessary, the need for more tests.  Vaccines  Your health care provider may recommend certain vaccines, such as:  Influenza vaccine. This is recommended every year.  Tetanus, diphtheria, and acellular pertussis (Tdap, Td) vaccine. You may need a Td booster every 10 years.  Zoster vaccine. You may need this  after age 70.  Pneumococcal 13-valent conjugate (PCV13) vaccine. One dose is recommended after age 48.  Pneumococcal polysaccharide (PPSV23) vaccine. One dose is recommended after age 21. Talk to your health care provider about which screenings and vaccines you need and how often you need them. This information is not intended to replace advice given to you by your health care provider. Make sure you discuss any questions you have with your health care provider. Document Released: 05/08/2015 Document Revised: 12/30/2015 Document Reviewed: 02/10/2015 Elsevier Interactive Patient Education  2017 [place] Prevention in the Home Falls can cause injuries. They can happen to people of all ages. There are many things you can do to make your home safe and to help prevent falls. What can I do on the outside of my home?  Regularly fix the edges of walkways and driveways and fix any cracks.  Remove anything that might make you trip as you walk through a door, such as a raised step or threshold.  Trim any bushes or trees on the path to your home.  Use bright outdoor lighting.  Clear any walking paths of anything that might make someone trip, such as rocks or tools.  Regularly check to see if handrails are loose or broken. Make sure that both sides of any steps have handrails.  Any raised decks and porches should have guardrails on the edges.  Have any leaves, snow, or ice cleared regularly.  Use sand or salt on walking paths during winter.  Clean up any spills in your garage right away. This includes oil or grease spills. What can I do in the bathroom?  Use night lights.  Install grab bars by the toilet and in the tub and shower. Do not use towel bars as grab bars.  Use non-skid mats or decals in the tub or shower.  If you need to sit down in the shower, use a plastic, non-slip stool.  Keep the floor dry. Clean up any water that spills on the floor as soon as it  happens.  Remove soap buildup in the tub or shower regularly.  Attach bath mats securely with double-sided non-slip rug tape.  Do not have throw rugs and other things on the floor that can make you trip. What can I do in the bedroom?  Use night lights.  Make sure that you have a light by your bed that is easy to reach.  Do not use any sheets or blankets that are too big for your bed. They should not hang down onto the floor.  Have a firm chair that has side arms. You can use this for support while you get dressed.  Do not have throw rugs and other things on the floor that can make you trip. What can I do in the kitchen?  Clean up any spills right away.  Avoid walking on wet floors.  Keep items that you use a lot in easy-to-reach places.  If you need to reach something above you, use a strong step stool that has a grab bar.  Keep electrical cords out of the way.  Do not use floor polish or wax that makes floors slippery. If you must use wax, use non-skid floor wax.  Do not have throw rugs and other things on the floor that can make you trip. What can I do with my stairs?  Do not leave any items on the stairs.  Make sure that there are handrails on both sides of the stairs and use them. Fix handrails that are broken or loose. Make sure that handrails are as long as the stairways.  Check any carpeting to make sure that it is firmly attached to the stairs. Fix any carpet that is loose or worn.  Avoid having throw rugs at the top or bottom of the stairs. If you do have throw rugs, attach them to the floor with carpet tape.  Make sure that you have a light switch at the top of the stairs and the bottom of the stairs. If you do not have them, ask someone to add them for you. What else can I do to help prevent falls?  Wear shoes that:  Do not have high heels.  Have rubber bottoms.  Are comfortable and fit you well.  Are closed at the toe. Do not wear sandals.  If you  use a stepladder:  Make sure that it is fully opened. Do not climb a closed stepladder.  Make sure that both sides of the stepladder are locked into place.  Ask someone to hold it for you, if possible.  Clearly mark and make sure that you can see:  Any grab bars or handrails.  First and last steps.  Where the edge of each step is.  Use tools that help you move around (mobility aids) if they are needed. These include:  Canes.  Walkers.  Scooters.  Crutches.  Turn on the lights when you go into a dark area. Replace any light bulbs as soon as they burn out.  Set up your furniture so you have a clear path. Avoid moving your furniture around.  If any of your floors are uneven, fix them.  If there are any pets around you, be aware of where they are.  Review your medicines with your doctor. Some medicines can make you feel dizzy. This can increase your chance of falling. Ask your doctor what other things that you can do to help prevent falls. This information is not intended to replace advice given to you by your health care provider. Make sure you discuss any questions you have with your health care provider. Document Released: 02/05/2009 Document Revised: 09/17/2015 Document Reviewed: 05/16/2014 Elsevier Interactive Patient Education  2017 [employer].

## 2020-01-17 ENCOUNTER — Telehealth: Payer: Self-pay

## 2020-01-17 ENCOUNTER — Telehealth: Payer: Self-pay | Admitting: Cardiovascular Disease

## 2020-01-17 MED ORDER — ATORVASTATIN CALCIUM 40 MG PO TABS
40.0000 mg | ORAL_TABLET | Freq: Every day | ORAL | 3 refills | Status: DC
Start: 2020-01-17 — End: 2020-12-21

## 2020-01-17 MED ORDER — METOPROLOL SUCCINATE ER 25 MG PO TB24
75.0000 mg | ORAL_TABLET | Freq: Every day | ORAL | 0 refills | Status: DC
Start: 2020-01-17 — End: 2020-05-08

## 2020-01-17 MED ORDER — APIXABAN 5 MG PO TABS
5.0000 mg | ORAL_TABLET | Freq: Two times a day (BID) | ORAL | 5 refills | Status: DC
Start: 2020-01-17 — End: 2020-07-15

## 2020-01-17 NOTE — Telephone Encounter (Signed)
[facility]. Informed them that patient's metoprolol was increased after hospital stay on 12/24/19 to 75 mg. Informed them that patient could have a 90 day supply. Will forward to Dr. Oval Linsey for any further advisement.

## 2020-01-17 NOTE — Telephone Encounter (Signed)
..   LAST APPOINTMENT DATE: 01/16/2020   NEXT APPOINTMENT DATE:@9 /29/2021  MEDICATION:atorvastatin (LIPITOR) 40 MG tablet    PHARMACY: [facility]

## 2020-01-17 NOTE — Telephone Encounter (Signed)
*  STAT* If patient is at the pharmacy, call can be transferred to refill team.   1. Which medications need to be refilled? (please list name of each medication and dose if known) apixaban (ELIQUIS) 5 MG TABS tablet / metoprolol succinate (TOPROL XL) 25 MG 24 hr tablet  2. Which pharmacy/location (including street and city if local pharmacy) is medication to be sent to? [place], [place]  3. Do they need a 30 day or 90 day supply? [place]

## 2020-01-17 NOTE — Telephone Encounter (Signed)
Pt c/o medication issue:  1. Name of Medication: metoprolol succinate (TOPROL XL) 25 MG 24 hr tablet  2. How are you currently taking this medication (dosage and times per day)? As directed  3. Are you having a reaction (difficulty breathing--STAT)? no  4. What is your medication issue? [name] [name] is calling because they have instructions that state 1 tablet daily whereas the prescription sent in shows 3 tablets daily. Is this the correct instructions. Also, prescription was sent in for 30 days rather than 90. Please advise.

## 2020-01-17 NOTE — Telephone Encounter (Signed)
Agree.  Discharge list says metoprolol succinate 75mg  daily

## 2020-01-17 NOTE — Telephone Encounter (Signed)
Prescription refill request for Eliquis received. Indication:  Atrial Fibrillation Last office visit: 11/2019  Oval Linsey Scr: 1.11 11/2019 Age: 86 Weight: 72.6 kg  Prescription refilled

## 2020-01-20 ENCOUNTER — Telehealth: Payer: Self-pay | Admitting: Family Medicine

## 2020-01-20 NOTE — Telephone Encounter (Signed)
Initial urinalysis was completely normal.  Follow-up urine culture showed bacteria likely from the skin and under criteria for true urinary tract infection.  Can recollect urine culture under her urinary hesitancy if worsening symptoms   Per culture: 10,000-49,000 CFU per mL of Staphylococcus simulans.  May represent colonizers from external/internal genitalia.  No further testing (including susceptibility) will be performed

## 2020-01-22 ENCOUNTER — Other Ambulatory Visit: Payer: Medicare Other

## 2020-01-22 ENCOUNTER — Other Ambulatory Visit: Payer: Self-pay

## 2020-01-22 DIAGNOSIS — N183 Chronic kidney disease, stage 3 unspecified: Secondary | ICD-10-CM

## 2020-01-22 DIAGNOSIS — Z23 Encounter for immunization: Secondary | ICD-10-CM | POA: Diagnosis not present

## 2020-01-22 NOTE — Telephone Encounter (Signed)
Called and lm for pt tcb. 

## 2020-01-23 ENCOUNTER — Ambulatory Visit: Payer: Medicare Other

## 2020-01-23 LAB — COMPLETE METABOLIC PANEL WITH GFR
AG Ratio: 1.9 (calc) (ref 1.0–2.5)
ALT: 7 U/L (ref 6–29)
AST: 13 U/L (ref 10–35)
Albumin: 4 g/dL (ref 3.6–5.1)
Alkaline phosphatase (APISO): 79 U/L (ref 37–153)
BUN/Creatinine Ratio: 16 (calc) (ref 6–22)
BUN: 18 mg/dL (ref 7–25)
CO2: 31 mmol/L (ref 20–32)
Calcium: 9.2 mg/dL (ref 8.6–10.4)
Chloride: 98 mmol/L (ref 98–110)
Creat: 1.11 mg/dL — ABNORMAL HIGH (ref 0.60–0.88)
GFR, Est African American: 50 mL/min/{1.73_m2} — ABNORMAL LOW (ref >=60)
GFR, Est Non African American: 43 mL/min/{1.73_m2} — ABNORMAL LOW (ref >=60)
Globulin: 2.1 g/dL (calc) (ref 1.9–3.7)
Glucose, Bld: 124 mg/dL — ABNORMAL HIGH (ref 65–99)
Potassium: 3.4 mmol/L — ABNORMAL LOW (ref 3.5–5.3)
Sodium: 140 mmol/L (ref 135–146)
Total Bilirubin: 0.5 mg/dL (ref 0.2–1.2)
Total Protein: 6.1 g/dL (ref 6.1–8.1)

## 2020-01-23 LAB — CBC WITH DIFFERENTIAL/PLATELET
Absolute Monocytes: 525 cells/uL (ref 200–950)
Basophils Absolute: 50 cells/uL (ref 0–200)
Basophils Relative: 0.7 %
Eosinophils Absolute: 142 cells/uL (ref 15–500)
Eosinophils Relative: 2 %
HCT: 36.6 % (ref 35.0–45.0)
Hemoglobin: 11.8 g/dL (ref 11.7–15.5)
Lymphs Abs: 1860 cells/uL (ref 850–3900)
MCH: 27.8 pg (ref 27.0–33.0)
MCHC: 32.2 g/dL (ref 32.0–36.0)
MCV: 86.3 fL (ref 80.0–100.0)
MPV: 10.3 fL (ref 7.5–12.5)
Monocytes Relative: 7.4 %
Neutro Abs: 4523 cells/uL (ref 1500–7800)
Neutrophils Relative %: 63.7 %
Platelets: 240 10*3/uL (ref 140–400)
RBC: 4.24 10*6/uL (ref 3.80–5.10)
RDW: 13.2 % (ref 11.0–15.0)
Total Lymphocyte: 26.2 %
WBC: 7.1 10*3/uL (ref 3.8–10.8)

## 2020-01-24 ENCOUNTER — Ambulatory Visit (INDEPENDENT_AMBULATORY_CARE_PROVIDER_SITE_OTHER): Payer: Medicare Other | Admitting: Nurse Practitioner

## 2020-01-24 ENCOUNTER — Encounter: Payer: Self-pay | Admitting: Nurse Practitioner

## 2020-01-24 VITALS — BP 142/80 | HR 94 | Ht 59.0 in | Wt 156.0 lb

## 2020-01-24 DIAGNOSIS — K559 Vascular disorder of intestine, unspecified: Secondary | ICD-10-CM | POA: Diagnosis not present

## 2020-01-24 NOTE — Patient Instructions (Signed)
If you are age 84 or older, your body mass index should be between 23-30. Your Body mass index is 31.51 kg/m. If this is out of the aforementioned range listed, please consider follow up with your Primary Care Provider.  If you are age 53 or younger, your body mass index should be between 19-25. Your Body mass index is 31.51 kg/m. If this is out of the aformentioned range listed, please consider follow up with your Primary Care Provider.   Call back if abdominal pain and bloody bowel movements return.  Follow up as needed.

## 2020-01-24 NOTE — Progress Notes (Signed)
01/24/2020 [name] [name] [id number] 10/31/1924   Chief Complaint: follow up ischemic colitis   History of Present Illness: [name] [name] is a 86 year old female with a past medical history of hypertension, paroxysmal atrial fibrillation on Eliquis, peripheral vascular disease s/p right fem-popliteal bypass 2006 with revision in 2010. COPD, GERD, diverticulosis and colon polyps.   She has a history of peripheral arterial disease.  An arteriogram February 2020 that showed a 80% narrowing of the midportion of her right femoral-popliteal bypass graft and a 70% stenosis of her left common femoral artery.  She was admitted to the hospital 12/09/2019 and underwent a redo right femoral to below-knee popliteal artery bypass as well as left common femoral endarterectomy pericardial patch 12/09/2019 per Dr. Oneida Alar.  She received 2 PRBCs in peri-op period for surgical EBL of 700  ML.  She developed hematochezia post operatively. An abd/pelvic CT showed evidence of  ischemic colitis. Her hospital course was further complicated by atrial fibrillation with RVR, she was placed on IV heparin then transitioned  to Eliquis 12/13/2019.  She was was admitted to [facility] rehab then discharged home on 12/24/2019. She she presents to our office today for GI follow up. She is accompanied by her son Nicki Reaper. She reports feeling well. She is ambulating with the assistance of a walker. Her appetite is good. No abdominal pain. No diarrhea or hematochezia. Her stools are dark brown, passing a BM most days. No melenic stools. She questions if she can stop taking Florastor which is expensive. No GERD symptoms on Pantoprazole 40mg  bid.   Sister with history of colon cancer. Father and sister with history of colon polyps.  08/2006 colonoscopy for surveillance of adenomatous polyps.  4 small, sessile polyps were removed but not retrieved.  Left sided/sigmoid diverticulosis noted. 11/2006 flex sig for diarrhea.  Mild, nonspecific  colitis noted in descending to sigmoid w rectal sparing, severe sigmoid diverticulosis.  Path: Lymphocytic colitis. 02/2018 esophagram for eval of meal associated coughing/choking: Intermittent, mid to distal esophageal dysmotility with tertiary contractions.  Contrast as well as pill eventually passed into the stomach.  CBC Latest Ref Rng & Units 01/22/2020 12/23/2019 12/20/2019  WBC 3.8 - 10.8 Thousand/uL 7.1 15.0(H) 14.9(H)  Hemoglobin 11.7 - 15.5 g/dL 11.8 9.1(L) 8.9(L)  Hematocrit 35 - 45 % 36.6 29.4(L) 27.8(L)  Platelets 140 - 400 Thousand/uL 240 284 263   CMP Latest Ref Rng & Units 01/22/2020 12/23/2019 12/19/2019  Glucose 65 - 99 mg/dL 124(H) 120(H) 182(H)  BUN 7 - 25 mg/dL 18 15 10   Creatinine 0.60 - 0.88 mg/dL 1.11(H) 1.11(H) 0.89  Sodium 135 - 146 mmol/L 140 136 137  Potassium 3.5 - 5.3 mmol/L 3.4(L) 4.4 4.1  Chloride 98 - 110 mmol/L 98 99 98  CO2 20 - 32 mmol/L 31 26 29   Calcium 8.6 - 10.4 mg/dL 9.2 8.3(L) 7.9(L)  Total Protein 6.1 - 8.1 g/dL 6.1 - -  Total Bilirubin 0.2 - 1.2 mg/dL 0.5 - -  Alkaline Phos 38 - 126 U/L - - -  AST 10 - 35 U/L 13 - -  ALT 6 - 29 U/L 7 - -   CTAP 12/10/2019: 1. Imaging findings of acute colitis involving the distal transverse colon, splenic flexure, descending colon and sigmoid colon. 2. Colonic diverticulosis without definitive evidence to suggest an acute diverticulitis at this time. 3. Gas non dependently in the lumen of the urinary bladder. If there has been recent catheterization for urinalysis, this is presumably iatrogenic.  In the absence of a catheterization, further evaluation with urinalysis would be recommended to exclude the possibility of infection with gas-forming organisms. 4. Multiple low-attenuation lesions in the kidneys bilaterally, most of which appear to represent simple cysts. In addition, however, there is an indeterminate lesion in the lower pole of the left kidney which warrants further evaluation. Follow-up evaluation  with nonemergent abdominal MRI with and without IV gadolinium is recommended in the near future to exclude neoplasm. 5. Aortic atherosclerosis, in addition to left main and 3 vessel coronary artery disease. 6. There are calcifications of the aortic valve and mitral annulus. Echocardiographic correlation for evaluation of potential valvular dysfunction may be warranted if clinically indicated. 7. Additional incidental findings, as above.       Current Outpatient Medications on File Prior to Visit  Medication Sig Dispense Refill  . acetaminophen (TYLENOL) 325 MG tablet Take 1-2 tablets (325-650 mg total) by mouth every 4 (four) hours as needed for mild pain (or temp >/= 101 F).    Marland Kitchen apixaban (ELIQUIS) 5 MG TABS tablet Take 1 tablet (5 mg total) by mouth 2 (two) times daily. 60 tablet 5  . aspirin EC 81 MG tablet Take 81 mg by mouth every evening.     Marland Kitchen atorvastatin (LIPITOR) 40 MG tablet Take 1 tablet (40 mg total) by mouth daily. 90 tablet 3  . clonazePAM (KLONOPIN) 0.25 MG disintegrating tablet Take 1 tablet (0.25 mg total) by mouth at bedtime as needed (anxiety). 20 tablet 0  . furosemide (LASIX) 20 MG tablet Take 1 tablet (20 mg total) by mouth daily as needed (increased swelling). 30 tablet 1  . lamoTRIgine (LAMICTAL) 100 MG tablet Take 1 tablet (100 mg total) by mouth 2 (two) times daily. 60 tablet 5  . metoprolol succinate (TOPROL XL) 25 MG 24 hr tablet Take 3 tablets (75 mg total) by mouth daily. 30 tablet 0  . Multiple Vitamin (MULTIVITAMIN WITH MINERALS) TABS tablet Take 1 tablet by mouth daily.    . pantoprazole (PROTONIX) 40 MG tablet Take 1 tablet (40 mg total) by mouth 2 (two) times daily. 60 tablet 0  . polycarbophil (FIBERCON) 625 MG tablet Take 1 tablet (625 mg total) by mouth daily. 30 tablet 0  . RESTASIS 0.05 % ophthalmic emulsion Place 1 drop into both eyes 2 (two) times daily as needed (dry eyes).   99  . saccharomyces boulardii (FLORASTOR) 250 MG capsule Take 1  capsule (250 mg total) by mouth 2 (two) times daily. 60 capsule 0   No current facility-administered medications on file prior to visit.   Allergies  Allergen Reactions  . Sulfa Antibiotics Other (See Comments)    Cold sweat light headed and disorientation  . Tiotropium Bromide Shortness Of Breath and Other (See Comments)    Sore throat also  . Latex Itching and Rash  . Ultram [Tramadol] Nausea And Vomiting  . 5-Alpha Reductase Inhibitors       Current Medications, Allergies, Past Medical History, Past Surgical History, Family History and Social History were reviewed in [employer] record.   Review of Systems:   Constitutional: Negative for fever, sweats, chills or weight loss.  Respiratory: Negative for shortness of breath.   Cardiovascular: Negative for chest pain, palpitations and leg swelling.  Gastrointestinal: See HPI.  Musculoskeletal: No new aches/pains.  Neurological: Negative for dizziness, headaches or paresthesias.    Physical Exam: BP (!) 142/80 (BP Location: Right Arm, Patient Position: Sitting, Cuff Size: Normal)   Pulse 94  Ht 4\' 11"  (1.499 m)   Wt 156 lb (70.8 kg)   BMI 31.51 kg/m   General: Well developed 86 year old female in no acute distress. Head: Normocephalic and atraumatic. Eyes: No scleral icterus. Conjunctiva pink . Ears: Normal auditory acuity. Lungs: Clear throughout to auscultation. Heart: Regular rate and rhythm, systolic murmur. Abdomen: Soft, nontender and nondistended. No masses or hepatomegaly. Normal bowel sounds x 4 quadrants. Lower abdominal scar intact. Right and left groin scars intact, right groin with questionable early candidiasis.  Rectal: Deferred.  Musculoskeletal: Symmetrical with no gross deformities. Extremities: No edema. Neurological: Alert oriented x 4. No focal deficits.  Psychological: Alert and cooperative. Normal mood and affect  Assessment and Recommendations:  59. 86 year old female  s/p redo right femoral to below-knee popliteal artery bypass and and left common femoral endarterectomy pericardial patch 12/09/2019, post operative course complicated by ischemic colitis which has resolved.  -No plans for endoscopic evaluation -Ok to stop Florastor probiotic -Call our office if abdominal pain or hematochezia -Follow up as needed -Follow up with vascular surgeon and/or Dr. Yong Channel check right groin incision area for possible developing candidiasis   2. Anemia, improving. Hg 9.1 -> 11.8 -Continue H/H monitoring per PCP  3. Afib on Eliquis   4. GERD, stable -Continue Pantoprazole as previously ordered   5. History of colon polyps. Family history of colon cancer. -No further colonoscopies recommended due to age

## 2020-02-02 ENCOUNTER — Other Ambulatory Visit: Payer: Self-pay | Admitting: Allergy and Immunology

## 2020-02-03 NOTE — Progress Notes (Signed)
Reviewed and agree with documentation and assessment and plan. K. Veena Frankye Schwegel , MD   

## 2020-02-05 DIAGNOSIS — Z23 Encounter for immunization: Secondary | ICD-10-CM | POA: Diagnosis not present

## 2020-02-14 ENCOUNTER — Ambulatory Visit: Payer: Medicare Other | Admitting: Family Medicine

## 2020-02-18 ENCOUNTER — Other Ambulatory Visit: Payer: Self-pay

## 2020-02-18 ENCOUNTER — Ambulatory Visit (INDEPENDENT_AMBULATORY_CARE_PROVIDER_SITE_OTHER): Payer: Medicare Other | Admitting: Allergy and Immunology

## 2020-02-18 VITALS — BP 128/70 | HR 60 | Resp 16

## 2020-02-18 DIAGNOSIS — I779 Disorder of arteries and arterioles, unspecified: Secondary | ICD-10-CM | POA: Diagnosis not present

## 2020-02-18 DIAGNOSIS — K219 Gastro-esophageal reflux disease without esophagitis: Secondary | ICD-10-CM

## 2020-02-18 DIAGNOSIS — H04123 Dry eye syndrome of bilateral lacrimal glands: Secondary | ICD-10-CM

## 2020-02-18 DIAGNOSIS — J3089 Other allergic rhinitis: Secondary | ICD-10-CM | POA: Diagnosis not present

## 2020-02-18 MED ORDER — PANTOPRAZOLE SODIUM 40 MG PO TBEC: DELAYED_RELEASE_TABLET | ORAL | 11 refills | Status: DC

## 2020-02-18 NOTE — Patient Instructions (Addendum)
  1.  Continue to Treat LPR:   A.  Pantoprazole 40mg  - 1 tablet 2 times per day  2. Return to clinic in 12 months or earlier if problem.

## 2020-02-18 NOTE — Progress Notes (Signed)
[place]   Follow-up Note  Referring Provider: Marin Olp, MD Primary Provider: Marin Olp, MD Date of Office Visit: 02/18/2020  Subjective:   [name] [name] (DOB: 1924-08-03) is a 86 y.o. female who returns to the [facility] [place] on 02/18/2020 in re-evaluation of the following:  HPI: Noa returns to this clinic in evaluation of LPR and chronic rhinitis and dry eye syndrome.  Her last visit to this clinic was 23 October 2018.  She has really done well since her last visit and has had very little issues revolving around either her nose or her eyes or her throat or reflux.  She has improved so much that she no longer needs to use any nasal ipratropium or Systane or Restasis.  She continues to use pantoprazole 40 mg twice a day which has resulted in excellent control of all of her reflux and throat issues.  She has received 3 Pfizer Covid vaccinations and the flu vaccine.  She underwent lower extremity bypass surgery 09 December 2019 with good result.  Allergies as of 02/18/2020      Reactions   Sulfa Antibiotics Other (See Comments)   Cold sweat light headed and disorientation   Tiotropium Bromide Shortness Of Breath, Other (See Comments)   Sore throat also   Latex Itching, Rash   Ultram [tramadol] Nausea And Vomiting   5-alpha Reductase Inhibitors       Medication List      acetaminophen 325 MG tablet Commonly known as: TYLENOL Take 1-2 tablets (325-650 mg total) by mouth every 4 (four) hours as needed for mild pain (or temp >/= 101 F).   apixaban 5 MG Tabs tablet Commonly known as: ELIQUIS Take 1 tablet (5 mg total) by mouth 2 (two) times daily.   aspirin EC 81 MG tablet Take 81 mg by mouth every evening.   atorvastatin 40 MG tablet Commonly known as: LIPITOR Take 1 tablet (40 mg total) by mouth daily.   clonazePAM 0.25 MG disintegrating tablet Commonly known as: KLONOPIN Take 1 tablet  (0.25 mg total) by mouth at bedtime as needed (anxiety).   furosemide 20 MG tablet Commonly known as: LASIX Take 1 tablet (20 mg total) by mouth daily as needed (increased swelling).   lamoTRIgine 100 MG tablet Commonly known as: LAMICTAL Take 1 tablet (100 mg total) by mouth 2 (two) times daily.   metoprolol succinate 25 MG 24 hr tablet Commonly known as: Toprol XL Take 3 tablets (75 mg total) by mouth daily.   multivitamin with minerals Tabs tablet Take 1 tablet by mouth daily.   pantoprazole 40 MG tablet Commonly known as: PROTONIX Take one tablet twice daily as directed   polycarbophil 625 MG tablet Commonly known as: FIBERCON Take 1 tablet (625 mg total) by mouth daily.   Restasis 0.05 % ophthalmic emulsion Generic drug: cycloSPORINE Place 1 drop into both eyes 2 (two) times daily as needed (dry eyes).   saccharomyces boulardii 250 MG capsule Commonly known as: FLORASTOR Take 1 capsule (250 mg total) by mouth 2 (two) times daily.       Past Medical History:  Diagnosis Date  . Allergic rhinoconjunctivitis   . Allergy   . Anxiety    pt. managed- uses deep breathing   . Arthritis    low back , stenosis  . COLONIC POLYPS, RECURRENT 08/29/2006   2008 last colonoscopy. No further colonoscopy.     Marland Kitchen COPD (chronic  obstructive pulmonary disease) ([place])   . Diverticulosis   . Dysrhythmia    afib  . Eczema   . Environmental allergies    allergy shot- q friday in Dr. Janee Morn office. PFT's abnormal- recommended Spiriva to use preop & will d/c after surgery  . GERD (gastroesophageal reflux disease)   . Hiatal hernia   . Hyperlipidemia   . Hypertension   . Lung nodule 2011  . Paroxysmal atrial fibrillation ([place]) 11/27/2019  . Peripheral vascular disease ([place])   . Shingles   . Stenosis of popliteal artery ([facility])    blood clots in legs long ago    . Trigeminal neuralgia    Left buttocks    Past Surgical History:  Procedure Laterality Date  . ABDOMINAL AORTAGRAM N/A  06/17/2011   Procedure: ABDOMINAL Maxcine Ham;  Surgeon: Elam Dutch, MD;  Location: [facility] CATH LAB;  Service: Cardiovascular;  Laterality: N/A;  . ABDOMINAL AORTOGRAM W/LOWER EXTREMITY Bilateral 06/22/2018   Procedure: ABDOMINAL AORTOGRAM W/LOWER EXTREMITY;  Surgeon: Elam Dutch, MD;  Location: [facility] CV LAB;  Service: Cardiovascular;  Laterality: Bilateral;  . ABDOMINAL AORTOGRAM W/LOWER EXTREMITY Bilateral 11/29/2019   Procedure: ABDOMINAL AORTOGRAM W/LOWER EXTREMITY;  Surgeon: Elam Dutch, MD;  Location: [facility] CV LAB;  Service: Cardiovascular;  Laterality: Bilateral;  . CATARACT EXTRACTION, BILATERAL     w IOL  . CHOLECYSTECTOMY  1998  . COLONOSCOPY    . ENDARTERECTOMY FEMORAL Left 12/09/2019   Procedure: ENDARTERECTOMY FEMORAL with bovine patch angioplasty.;  Surgeon: Elam Dutch, MD;  Location: [facility] OR;  Service: Vascular;  Laterality: Left;  . FEMORAL-POPLITEAL BYPASS GRAFT        x2 surgeries 1990's & 2009  . FEMORAL-POPLITEAL BYPASS GRAFT Right 12/09/2019   Procedure: REDO RIGHT FEMORAL-POPLITEAL ARTERY BYPASS GRAFT;  Surgeon: Elam Dutch, MD;  Location: [facility];  Service: Vascular;  Laterality: Right;  . INJECTION KNEE Right Aug. 2016   Gel injection for pain  . LUMBAR FUSION  07/06/2011  . TONSILLECTOMY     as a teenager   . TUBAL LIGATION      Review of systems negative except as noted in HPI / PMHx or noted below:  Review of Systems  Constitutional: Negative.   HENT: Negative.   Eyes: Negative.   Respiratory: Negative.   Cardiovascular: Negative.   Gastrointestinal: Negative.   Genitourinary: Negative.   Musculoskeletal: Negative.   Skin: Negative.   Neurological: Negative.   Endo/Heme/Allergies: Negative.   Psychiatric/Behavioral: Negative.      Objective:   Vitals:   02/18/20 1339  BP: 128/70  Pulse: 60  Resp: 16          Physical Exam Constitutional:      Appearance: She is not diaphoretic.  HENT:     Head:  Normocephalic.     Right Ear: Tympanic membrane, ear canal and external ear normal.     Left Ear: Tympanic membrane, ear canal and external ear normal.     Nose: Nose normal. No mucosal edema or rhinorrhea.     Mouth/Throat:     Pharynx: Uvula midline. No oropharyngeal exudate.  Eyes:     Conjunctiva/sclera: Conjunctivae normal.  Neck:     Thyroid: No thyromegaly.     Trachea: Trachea normal. No tracheal tenderness or tracheal deviation.  Cardiovascular:     Rate and Rhythm: Normal rate and regular rhythm.     Heart sounds: Normal heart sounds, S1 normal and S2 normal. No murmur heard.   Pulmonary:  Effort: No respiratory distress.     Breath sounds: Normal breath sounds. No stridor. No wheezing or rales.  Lymphadenopathy:     Head:     Right side of head: No tonsillar adenopathy.     Left side of head: No tonsillar adenopathy.     Cervical: No cervical adenopathy.  Skin:    Findings: No erythema or rash.     Nails: There is no clubbing.  Neurological:     Mental Status: She is alert.     Diagnostics: none  Assessment and Plan:   1. LPRD (laryngopharyngeal reflux disease)   2. Other allergic rhinitis   3. Dry eye syndrome of both eyes     1.  Continue to Treat LPR:   A.  Pantoprazole 40mg  - 1 tablet 2 times per day  2. Return to clinic in 12 months or earlier if problem.    Kadra is doing wonderful and her requirement for medications has actually decreased dramatically over the course of the past year and she does not use any therapy for her dry eye or her nasal issue at this point.  She does continue on pantoprazole twice a day which has resulted in very good control of her LPR.  We will keep her on this plan and see her back in this clinic in 1 year or earlier if there is a problem.  Allena Katz, MD Allergy / Immunology [place]

## 2020-02-18 NOTE — Patient Instructions (Addendum)
If possible get a scale at home and weigh daily in the morning. If weight up more than 2 lbs from baseline take a lasix daily until back down to baseline weight. Or could take lasix daily if have increased swelling and try off otherwise. I dont want to set you back too far because your swelling does look much better but I want to balance that with not stressing the kidneys.  Let me know if you change your mind and want to see sports medicine or podiatry for the foot pain but start with Dr. Posey Pronto tomorrow  Please stop by lab before you go If you have mychart- we will send your results within 3 business days of [place] receiving them.  If you do not have mychart- we will call you about results within 5 business days of [place] receiving them.  *please note we are currently using [facility] which has a longer processing time than [place] typically so labs may not come back as quickly as in the past *please also note that you will see labs on mychart as soon as they post. I will later go in and write notes on them- will say "notes from Dr. Yong Channel"

## 2020-02-18 NOTE — Progress Notes (Addendum)
Phone [phone] In person visit   Subjective:   [name] [name] is a 86 y.o. year old very pleasant female patient who presents for/with See problem oriented charting Chief Complaint  Patient presents with  . Hyperlipidemia  . Hypertension   This visit occurred during the SARS-CoV-2 public health emergency.  Safety protocols were in place, including screening questions prior to the visit, additional usage of staff PPE, and extensive cleaning of exam room while observing appropriate contact time as indicated for disinfecting solutions.   Past Medical History-  Patient Active Problem List   Diagnosis Date Noted  . Paroxysmal atrial fibrillation ([place]) 11/27/2019    Priority: High  . Pain in right ankle and joints of right foot 08/28/2014    Priority: High  . Peripheral arterial disease ([place]) 06/29/2007    Priority: High  . CKD (chronic kidney disease), stage III ([place]) 08/07/2017    Priority: Medium  . Acute gout of left foot 03/22/2016    Priority: Medium  . Trigeminal neuralgia     Priority: Medium  . Chronic low back pain 09/13/2010    Priority: Medium  . COPD mixed type ([place]) 01/22/2010    Priority: Medium  . Insulin resistance 01/06/2009    Priority: Medium  . Hyperlipidemia 10/23/2006    Priority: Medium  . Essential hypertension 10/23/2006    Priority: Medium  . ESOPHAGEAL STRICTURE 08/09/1993    Priority: Medium  . Secondary hypercoagulable state ([place]) 12/03/2019    Priority: Low  . Obesity (BMI 30.0-34.9) 11/14/2017    Priority: Low  . Unilateral primary osteoarthritis, right knee 04/07/2016    Priority: Low  . Encounter for therapeutic drug monitoring 10/21/2013    Priority: Low  . Syncope 12/29/2011    Priority: Low  . Allergic rhinitis due to pollen 06/25/2010    Priority: Low  . Solitary pulmonary nodule 01/12/2010    Priority: Low  . Osteopenia 01/06/2009    Priority: Low  . Eczema 11/26/2007    Priority: Low  . Sleep disturbance 01/09/2020    . Acute lower UTI   . Anxiety state   . Peripheral edema   . Leukocytosis   . Debility 12/13/2019  . Dyslipidemia   . Rectal bleeding   . Acute blood loss anemia   . Ischemic colitis ([place])   . Status post femoral-popliteal bypass surgery 12/09/2019  . Chronic bilateral low back pain with right-sided sciatica 08/29/2019  . Chronic pain of right knee 08/29/2019  . Abnormality of gait 05/30/2019  . Neuropathic pain 05/30/2019  . Myalgia 08/08/2018  . Chronic pain syndrome 06/27/2018  . Gross hematuria 06/01/2018  . Failed back syndrome 09/28/2017    Medications- reviewed and updated Current Outpatient Medications  Medication Sig Dispense Refill  . acetaminophen (TYLENOL) 325 MG tablet Take 1-2 tablets (325-650 mg total) by mouth every 4 (four) hours as needed for mild pain (or temp >/= 101 F).    Marland Kitchen apixaban (ELIQUIS) 5 MG TABS tablet Take 1 tablet (5 mg total) by mouth 2 (two) times daily. 60 tablet 5  . aspirin EC 81 MG tablet Take 81 mg by mouth every evening.     Marland Kitchen atorvastatin (LIPITOR) 40 MG tablet Take 1 tablet (40 mg total) by mouth daily. 90 tablet 3  . furosemide (LASIX) 20 MG tablet Take 1 tablet (20 mg total) by mouth daily as needed (increased swelling). 30 tablet 1  . lamoTRIgine (LAMICTAL) 100 MG tablet Take 1 tablet (100 mg total) by mouth 2 (  two) times daily. 60 tablet 5  . metoprolol succinate (TOPROL XL) 25 MG 24 hr tablet Take 3 tablets (75 mg total) by mouth daily. 30 tablet 0  . Multiple Vitamin (MULTIVITAMIN WITH MINERALS) TABS tablet Take 1 tablet by mouth daily.    . pantoprazole (PROTONIX) 40 MG tablet Take one tablet twice daily as directed 60 tablet 11  . polycarbophil (FIBERCON) 625 MG tablet Take 1 tablet (625 mg total) by mouth daily. 30 tablet 0  . clonazePAM (KLONOPIN) 0.25 MG disintegrating tablet Take 1 tablet (0.25 mg total) by mouth at bedtime as needed (anxiety). (Patient not taking: Reported on 02/19/2020) 20 tablet 0  . RESTASIS 0.05 %  ophthalmic emulsion Place 1 drop into both eyes 2 (two) times daily as needed (dry eyes).  (Patient not taking: Reported on 02/19/2020)  99   No current facility-administered medications for this visit.     Objective:  BP 128/66   Pulse 96   Temp 98.1 F (36.7 C) (Temporal)   Resp 18   Ht 4\' 11"  (1.499 m)   Wt 153 lb 12.8 oz (69.8 kg)   SpO2 97%   BMI 31.06 kg/m  Gen: NAD, resting comfortably CV: irregularly irregular Lungs: CTAB no crackles, wheeze, rhonchi Ext: no edema Skin: warm, dry     Assessment and Plan   # PAD- compliant with aspirin and statin given her PAD. She is also on eliquis now instead of coumadin (cardiology preferred) . I am reaching out to cardiology Dr. Oval Linsey and vascular  Dr. Oneida Alar to see after ischemic colitis bout if they want [place] to continue aspirin and statin. Anemia thankfully has resolved. Appears stable- continue current meds unless get different information back from cardiology or vascular -She is also on Protonix 40 mg twice daily due to recent GI bleeding issues.  Has already had follow-up with GI -see addendum below  # Atrial fibrillation S: Rate controlled with metoprolol 75 mg XR Anticoagulated with eliquis 5 mg BID Patient is  followed by cardiology: Dr Oval Linsey  A/P: Stable. Continue current medications.  -see addendum below     #Hyperlipidemia S: Compliant with simvastatin at 40 mg.  LDL goal 70 or less due to arterial disease. Lab Results  Component Value Date   CHOL 77 12/10/2019   HDL 30 (L) 12/10/2019   LDLCALC 32 12/10/2019   LDLDIRECT 46.0 06/10/2019   TRIG 74 12/10/2019   CHOLHDL 2.6 12/10/2019   A/P: Cholesterol has been very well controlled-continue current medication. ldl goal at least under 70   #Essential hypertension S: Compliant with metoprolol 75 mg XR, lasix 20mg  daily (but can be prn) - prior on irbesartan 300 mg and hydrochlorothiazide 25 mg.   A/P: Blood pressure is well controlled today-continue current  medication  #Chronic kidney disease stage III S: Patient's GFR has been stable in the 50s for the most part-on last 2 readings has been in the 40s.  She knows to avoid NSAIDs A/P:Slight decrease on last 2 checks-opted to recheck today with ongoing lasix use - she was started on lasix in hospital and has continued- this could account for slight decrease and last potassium level. We discussed using on an as needed basis instead of daily but it has helped her edema- can use weight based or edema based "need"  #Insulin resistance/obesity S: very close to diabetes range in past  Lab Results  Component Value Date   HGBA1C 6.2 06/10/2019   HGBA1C 6.3 12/05/2018   HGBA1C 6.0 05/17/2018  A/P:update a1c with labs- focus on healthy eating as able- exercise likely limited by orthopedic issues and PAD   # lamictal 100 mg twice daily for trigeminal neuralgia remains helpful-stable continue current medications  Recommended follow up: Return in about 6 months (around 08/19/2020) for follow up- or sooner if needed. Future Appointments  Date Time Provider [place]  02/20/2020 11:20 AM Jamse Arn, MD [facility] CPR  02/27/2020  1:45 PM Deberah Pelton, NP [facility]  02/28/2020 10:50 AM Pieter Partridge, DO [facility] None  08/20/2020 11:20 AM Marin Olp, MD [facility]  01/21/2021 11:00 AM [facility] HEALTH COACH [facility]  02/23/2021 11:30 AM Kozlow, Donnamarie Poag, MD [facility] None    Lab/Order associations:   ICD-10-CM   1. Essential hypertension  I10 CBC With Differential/Platelet    COMPLETE METABOLIC PANEL WITH GFR  2. Hyperlipidemia, unspecified hyperlipidemia type  E78.5 CBC With Differential/Platelet    COMPLETE METABOLIC PANEL WITH GFR  3. Hyperglycemia  R73.9 Hemoglobin A1c  4. Paroxysmal atrial fibrillation (HCC)  I48.0   5. Trigeminal neuralgia  G50.0      Time Spent: 40 minutes of total time (11:45 AM-12:25 PM) was spent on the date of the encounter performing the  following actions: chart review prior to seeing the patient, obtaining history, performing a medically necessary exam, counseling on the treatment plan, placing orders, and documenting in our EHR.   Return precautions advised.  [name], MD  I communicated through my chart with Dr. Oneida Alar and Dr. Bari Edward were both okay with stopping aspirin and continuing Eliquis alone.  I communicated this with patient.  I also read her off lab results from her visit.  She had no further questions.  [name], MD

## 2020-02-19 ENCOUNTER — Ambulatory Visit (INDEPENDENT_AMBULATORY_CARE_PROVIDER_SITE_OTHER): Payer: Medicare Other | Admitting: Family Medicine

## 2020-02-19 ENCOUNTER — Encounter: Payer: Self-pay | Admitting: Family Medicine

## 2020-02-19 ENCOUNTER — Encounter: Payer: Self-pay | Admitting: Allergy and Immunology

## 2020-02-19 ENCOUNTER — Other Ambulatory Visit: Payer: Self-pay | Admitting: Family Medicine

## 2020-02-19 VITALS — BP 128/66 | HR 96 | Temp 98.1°F | Resp 18 | Ht 59.0 in | Wt 153.8 lb

## 2020-02-19 DIAGNOSIS — I779 Disorder of arteries and arterioles, unspecified: Secondary | ICD-10-CM | POA: Diagnosis not present

## 2020-02-19 DIAGNOSIS — E785 Hyperlipidemia, unspecified: Secondary | ICD-10-CM

## 2020-02-19 DIAGNOSIS — G500 Trigeminal neuralgia: Secondary | ICD-10-CM | POA: Diagnosis not present

## 2020-02-19 DIAGNOSIS — I739 Peripheral vascular disease, unspecified: Secondary | ICD-10-CM

## 2020-02-19 DIAGNOSIS — F411 Generalized anxiety disorder: Secondary | ICD-10-CM

## 2020-02-19 DIAGNOSIS — I480 Paroxysmal atrial fibrillation: Secondary | ICD-10-CM | POA: Diagnosis not present

## 2020-02-19 DIAGNOSIS — R739 Hyperglycemia, unspecified: Secondary | ICD-10-CM

## 2020-02-19 DIAGNOSIS — I10 Essential (primary) hypertension: Secondary | ICD-10-CM | POA: Diagnosis not present

## 2020-02-19 NOTE — Assessment & Plan Note (Signed)
S: Rate controlled with metoprolol 75 mg XR Anticoagulated with eliquis 5 mg BID Patient is  followed by cardiology: Dr Oval Linsey  A/P: Stable. Continue current medications.

## 2020-02-20 ENCOUNTER — Encounter: Payer: Self-pay | Admitting: Physical Medicine & Rehabilitation

## 2020-02-20 ENCOUNTER — Other Ambulatory Visit: Payer: Self-pay

## 2020-02-20 ENCOUNTER — Other Ambulatory Visit: Payer: Self-pay | Admitting: Family Medicine

## 2020-02-20 ENCOUNTER — Encounter: Payer: Medicare Other | Attending: Physical Medicine & Rehabilitation | Admitting: Physical Medicine & Rehabilitation

## 2020-02-20 VITALS — BP 149/85 | HR 88 | Temp 98.2°F | Ht 59.0 in | Wt 151.6 lb

## 2020-02-20 DIAGNOSIS — M791 Myalgia, unspecified site: Secondary | ICD-10-CM

## 2020-02-20 DIAGNOSIS — I779 Disorder of arteries and arterioles, unspecified: Secondary | ICD-10-CM

## 2020-02-20 DIAGNOSIS — R269 Unspecified abnormalities of gait and mobility: Secondary | ICD-10-CM | POA: Diagnosis not present

## 2020-02-20 DIAGNOSIS — M792 Neuralgia and neuritis, unspecified: Secondary | ICD-10-CM | POA: Diagnosis not present

## 2020-02-20 DIAGNOSIS — M961 Postlaminectomy syndrome, not elsewhere classified: Secondary | ICD-10-CM | POA: Diagnosis not present

## 2020-02-20 DIAGNOSIS — G894 Chronic pain syndrome: Secondary | ICD-10-CM | POA: Diagnosis not present

## 2020-02-20 DIAGNOSIS — G479 Sleep disorder, unspecified: Secondary | ICD-10-CM | POA: Diagnosis not present

## 2020-02-20 LAB — COMPLETE METABOLIC PANEL WITH GFR
AG Ratio: 1.6 (calc) (ref 1.0–2.5)
ALT: 12 U/L (ref 6–29)
AST: 18 U/L (ref 10–35)
Albumin: 4.1 g/dL (ref 3.6–5.1)
Alkaline phosphatase (APISO): 98 U/L (ref 37–153)
BUN/Creatinine Ratio: 20 (calc) (ref 6–22)
BUN: 24 mg/dL (ref 7–25)
CO2: 32 mmol/L (ref 20–32)
Calcium: 9.8 mg/dL (ref 8.6–10.4)
Chloride: 100 mmol/L (ref 98–110)
Creat: 1.19 mg/dL — ABNORMAL HIGH (ref 0.60–0.88)
GFR, Est African American: 46 mL/min/{1.73_m2} — ABNORMAL LOW (ref >=60)
GFR, Est Non African American: 39 mL/min/{1.73_m2} — ABNORMAL LOW (ref >=60)
Globulin: 2.5 g/dL (calc) (ref 1.9–3.7)
Glucose, Bld: 132 mg/dL — ABNORMAL HIGH (ref 65–99)
Potassium: 3.9 mmol/L (ref 3.5–5.3)
Sodium: 140 mmol/L (ref 135–146)
Total Bilirubin: 0.5 mg/dL (ref 0.2–1.2)
Total Protein: 6.6 g/dL (ref 6.1–8.1)

## 2020-02-20 LAB — HEMOGLOBIN A1C
Hgb A1c MFr Bld: 5.6 % of total Hgb (ref <5.7)
Mean Plasma Glucose: 114 (calc)
eAG (mmol/L): 6.3 (calc)

## 2020-02-20 LAB — CBC WITH DIFFERENTIAL/PLATELET
Absolute Monocytes: 614 cells/uL (ref 200–950)
Basophils Absolute: 58 cells/uL (ref 0–200)
Basophils Relative: 0.7 %
Eosinophils Absolute: 141 cells/uL (ref 15–500)
Eosinophils Relative: 1.7 %
HCT: 39.6 % (ref 35.0–45.0)
Hemoglobin: 12.7 g/dL (ref 11.7–15.5)
Lymphs Abs: 2216 cells/uL (ref 850–3900)
MCH: 26.6 pg — ABNORMAL LOW (ref 27.0–33.0)
MCHC: 32.1 g/dL (ref 32.0–36.0)
MCV: 82.8 fL (ref 80.0–100.0)
MPV: 10.7 fL (ref 7.5–12.5)
Monocytes Relative: 7.4 %
Neutro Abs: 5271 cells/uL (ref 1500–7800)
Neutrophils Relative %: 63.5 %
Platelets: 233 10*3/uL (ref 140–400)
RBC: 4.78 10*6/uL (ref 3.80–5.10)
RDW: 13.7 % (ref 11.0–15.0)
Total Lymphocyte: 26.7 %
WBC: 8.3 10*3/uL (ref 3.8–10.8)

## 2020-02-20 NOTE — Assessment & Plan Note (Signed)
I communicated through my chart with Dr. Oneida Alar and Dr. Bari Edward were both okay with stopping aspirin and continuing Eliquis alone.

## 2020-02-20 NOTE — Progress Notes (Signed)
Subjective:    Patient ID: [name] [name], female    DOB: 1924-06-15, 86 y.o.   MRN: [id number]   HPI  Female with pmh/psh of PAD, COPD, DVT, HTN, Gout, trigeminal neuralgia, post herpetic neuralgia, OA of right knee with injections, lumbar fusion present for follow up of pain in her back > leg pain.   Initially stated: Pt stated she has had back pain "all her life".  Getting progressively worse.  Movement improves the pain.  Standing and walking exacerbate the pain. Dull pain.  Non-radiating.  Intermittent.  Denies associated numbness, tingling, weakness.  Lidoderm patch help. Pain limits pt from doing things around the house.   Last clinic visit 01/09/20. Since that time, pt states her back and leg pain are improved. She has not used Lidocaine patched. She is doing HEP. She is taking Cymbalta and Baclofen BID. Sleep is on/off. Denies falls. Right knee and ankle are stable.   Pain Inventory Average Pain 2 Pain Right Now 3 My pain is constant and dull  In the last 24 hours, has pain interfered with the following? General activity 8 Relation with others 9 Enjoyment of life 0 What TIME of day is your pain at its worst? morning  Sleep (in general) Fair  Pain is worse with: walking, bending and standing Pain improves with: therapy/exercise Relief from Meds: None taken  Family History  Problem Relation Age of Onset  . Arthritis Mother   . Hypertension Mother   . Heart disease Father   . Colon polyps Father   . Breast cancer Sister   . Breast cancer Daughter   . Hypertension Son   . Lung cancer Brother   . Colon cancer Sister   . Brain cancer Sister   . Lung cancer Sister   . Anesthesia problems Neg Hx   . Hypotension Neg Hx   . Malignant hyperthermia Neg Hx   . Pseudochol deficiency Neg Hx    Social History   Socioeconomic History  . Marital status: Single    Spouse name: Not on file  . Number of children: 4  . Years of education: 32  . Highest education level: Not on  file  Occupational History  . Occupation: retired    Fish farm manager: RETIRED  Tobacco Use  . Smoking status: Former Smoker    Packs/day: 2.00    Years: 30.00    Pack years: 60.00    Types: Cigarettes    Quit date: 06/18/1993    Years since quitting: 26.6  . Smokeless tobacco: Never Used  . Tobacco comment: QUIT IN 1995  Vaping Use  . Vaping Use: Never used  Substance and Sexual Activity  . Alcohol use: No    Alcohol/week: 0.0 standard drinks  . Drug use: No  . Sexual activity: Not on file  Other Topics Concern  . Not on file  Social History Narrative   Patient is widowed with 4 children. 2 grandkids. Lives alone.    Patient is right handed.   Patient has high school education.   Patient drinks 1 cup daily.      Retired from [employer] for 28 years, works 2 days a week until 2016.       Hobbies: volunteers at [employer], [place] from church visits people.       No caffeine.    Social Determinants of Health   Financial Resource Strain: Low Risk   . Difficulty of Paying Living Expenses: Not hard at all  Food  Insecurity: No Food Insecurity  . Worried About Charity fundraiser in the Last Year: Never true  . Ran Out of Food in the Last Year: Never true  Transportation Needs: No Transportation Needs  . Lack of Transportation (Medical): No  . Lack of Transportation (Non-Medical): No  Physical Activity: Sufficiently Active  . Days of Exercise per Week: 7 days  . Minutes of Exercise per Session: 30 min  Stress: No Stress Concern Present  . Feeling of Stress : Not at all  Social Connections: Socially Isolated  . Frequency of Communication with Friends and Family: More than three times a week  . Frequency of Social Gatherings with Friends and Family: Never  . Attends Religious Services: Never  . Active Member of Clubs or Organizations: No  . Attends Archivist Meetings: Never  . Marital Status: Widowed   Past Surgical History:  Procedure Laterality  Date  . ABDOMINAL AORTAGRAM N/A 06/17/2011   Procedure: ABDOMINAL Maxcine Ham;  Surgeon: Elam Dutch, MD;  [facility] CATH LAB;  Service: Cardiovascular;  Laterality: N/A;  . ABDOMINAL AORTOGRAM W/LOWER EXTREMITY Bilateral 06/22/2018   Procedure: ABDOMINAL AORTOGRAM W/LOWER EXTREMITY;  Surgeon: Elam Dutch, MD;  Location: [place] CV LAB;  Service: Cardiovascular;  Laterality: Bilateral;  . ABDOMINAL AORTOGRAM W/LOWER EXTREMITY Bilateral 11/29/2019   Procedure: ABDOMINAL AORTOGRAM W/LOWER EXTREMITY;  Surgeon: Elam Dutch, MD;  Location: [place] CV LAB;  Service: Cardiovascular;  Laterality: Bilateral;  . CATARACT EXTRACTION, BILATERAL     w IOL  . CHOLECYSTECTOMY  1998  . COLONOSCOPY    . ENDARTERECTOMY FEMORAL Left 12/09/2019   Procedure: ENDARTERECTOMY FEMORAL with bovine patch angioplasty.;  Surgeon: Elam Dutch, MD;  Location: [facility] OR;  Service: Vascular;  Laterality: Left;  . FEMORAL-POPLITEAL BYPASS GRAFT        x2 surgeries 1990's & 2009  . FEMORAL-POPLITEAL BYPASS GRAFT Right 12/09/2019   Procedure: REDO RIGHT FEMORAL-POPLITEAL ARTERY BYPASS GRAFT;  Surgeon: Elam Dutch, MD;  Location: [place];  Service: Vascular;  Laterality: Right;  . INJECTION KNEE Right Aug. 2016   Gel injection for pain  . LUMBAR FUSION  07/06/2011  . TONSILLECTOMY     as a teenager   . TUBAL LIGATION     Past Surgical History:  Procedure Laterality Date  . ABDOMINAL AORTAGRAM N/A 06/17/2011   Procedure: ABDOMINAL Maxcine Ham;  Surgeon: Elam Dutch, MD;  Location[facility] LAB;  Service: Cardiovascular;  Laterality: N/A;  . ABDOMINAL AORTOGRAM W/LOWER EXTREMITY Bilateral 06/22/2018   Procedure: ABDOMINAL AORTOGRAM W/LOWER EXTREMITY;  Surgeon: Elam Dutch, MD;  Location: [place] CV LAB;  Service: Cardiovascular;  Laterality: Bilateral;  . ABDOMINAL AORTOGRAM W/LOWER EXTREMITY Bilateral 11/29/2019   Procedure: ABDOMINAL AORTOGRAM W/LOWER EXTREMITY;  Surgeon: Elam Dutch, MD;  Location: [place] CV LAB;  Service: Cardiovascular;  Laterality: Bilateral;  . CATARACT EXTRACTION, BILATERAL     w IOL  . CHOLECYSTECTOMY  1998  . COLONOSCOPY    . ENDARTERECTOMY FEMORAL Left 12/09/2019   Procedure: ENDARTERECTOMY FEMORAL with bovine patch angioplasty.;  Surgeon: Elam Dutch, MD;  Location: [facility] OR;  Service: Vascular;  Laterality: Left;  . FEMORAL-POPLITEAL BYPASS GRAFT        x2 surgeries 1990's & 2009  . FEMORAL-POPLITEAL BYPASS GRAFT Right 12/09/2019   Procedure: REDO RIGHT FEMORAL-POPLITEAL ARTERY BYPASS GRAFT;  Surgeon: Elam Dutch, MD;  Location: [place];  Service: Vascular;  Laterality: Right;  . INJECTION KNEE Right Aug. 2016  Gel injection for pain  . LUMBAR FUSION  07/06/2011  . TONSILLECTOMY     as a teenager   . TUBAL LIGATION     Past Medical History:  Diagnosis Date  . Allergic rhinoconjunctivitis   . Allergy   . Anxiety    pt. managed- uses deep breathing   . Arthritis    low back , stenosis  . COLONIC POLYPS, RECURRENT 08/29/2006   2008 last colonoscopy. No further colonoscopy.     Marland Kitchen COPD (chronic obstructive pulmonary disease) ([place])   . Diverticulosis   . Dysrhythmia    afib  . Eczema   . Environmental allergies    allergy shot- q friday in Dr. Janee Morn office. PFT's abnormal- recommended Spiriva to use preop & will d/c after surgery  . GERD (gastroesophageal reflux disease)   . Hiatal hernia   . Hyperlipidemia   . Hypertension   . Lung nodule 2011  . Paroxysmal atrial fibrillation ([place]) 11/27/2019  . Peripheral vascular disease ([place])   . Shingles   . Stenosis of popliteal artery (HCC)    blood clots in legs long ago    . Trigeminal neuralgia    Left buttocks   BP (!) 149/85   Pulse 88   Temp 98.2 F (36.8 C)   Ht 4\' 11"  (1.499 m)   Wt 151 lb 9.6 oz (68.8 kg)   SpO2 95%   BMI 30.62 kg/m   Opioid Risk Score:   Fall Risk Score:  `1  Depression screen PHQ 2/9  Depression screen [facility] 2/9 02/20/2020 01/16/2020  12/05/2018 08/08/2018 05/17/2018 01/05/2018 11/01/2017  Decreased Interest 1 0 0 0 0 0 0  Down, Depressed, Hopeless 1 0 0 0 0 0 0  PHQ - 2 Score 2 0 0 0 0 0 0  Altered sleeping - - - - - - 0  Tired, decreased energy - - - - - - 0  Change in appetite - - - - - - 0  Feeling bad or failure about yourself  - - - - - - 0  Trouble concentrating - - - - - - 0  Moving slowly or fidgety/restless - - - - - - 0  Suicidal thoughts - - - - - - 0  PHQ-9 Score - - - - - - 0  Difficult doing work/chores - - - - - - Not difficult at all  Some recent data might be hidden     Review of Systems  HENT: Negative.   Eyes: Negative.   Respiratory: Negative.   Cardiovascular: Negative.   Gastrointestinal: Negative.        Left flank pain  Endocrine: Negative.        High blood pressure   Genitourinary: Positive for difficulty urinating.  Musculoskeletal: Positive for arthralgias, back pain, gait problem and myalgias.  Skin: Negative.   Allergic/Immunologic: Negative.   Hematological: Negative.   Psychiatric/Behavioral: Negative.   All other systems reviewed and are negative.     Objective:   Physical Exam  Constitutional: No distress . Vital signs reviewed. HENT: Normocephalic.  Atraumatic. Eyes: EOMI. No discharge. Cardiovascular: No JVD.   Respiratory: Normal effort.  No stridor.   GI: Non-distended.   Skin: Warm and dry.  Intact. Psych: Normal mood.  Normal behavior. Musc: LE edema Gait: Mildly antalgic  Mild TTP lumbosacral PSPs Neuro: Alert Strength5/5 grossly throughout    Assessment & Plan:  Female with pmhpsh of PAD, COPD, DVT, HTN, Gout, trigeminal neuralgia, post herpetic neuralgia,  OA of right knee with injections, lumbar fusion present for follow up of pain in her back > leg pain.    1. Chronic low back pain with failed back syndrome   Xray of L-spine and right SI joint reviewed, SI joint unremarkable, degenerative changes in spine with right scoliosis.    Will  avoid NSAIDs due to coumadin  Stopped using Robaxin due to nausea  Unable to tolerate Gabapentin  Cont heat  Cont tylenol, limit to 2000mg /day  Resume Lidoderm patches  Continue HEP, completed PT  Resume TENS unit PRN  Continue Cymbalta to 60 mg  Continue Baclofen 5 BID  2. Possible Sacroiliitis bilaterally  Xray unremakrable  Minimal benefit with bracing   Discussed b/l SI joint injections  3. Sleep disturbance  Continue melatonin  Encouraged sleep hygiene  4. Neuropathic pain  See #1  5. Gait abnormality  Improved  Cont rolling walker, attempting to transition to cane  Cont HEP  Information given on core strengthening exercises, patient more compliant  Would like to refer to PT, however, she does not have transportation  6. Myalgia  Good benefit with trigger point injections  7. Right knee pain  MRI reviewed from 2016, showing meniscal/cartilage damage  Receiving injections from Ortho, cont  Pensaid denied  Cont voltaren gel  Cont lidoderm on knee  Improved  8. Right ankle pain - likely gout flare  Lidoderm patches, pt using Voltaren gel  Xrays reviewed, relatively uremarkable  Uric acid elevated  Improved   9. Rectal bleeding  Resolved  10. Trigeminal neuralgia.   Improved per patient, recommend follow up with Neuro regarding medication d/c questions  Continue Lamictal 100 mg twice daily   11. PVD s/p redo right femoral to below-knee popliteal bypass as well as left common femoral enterectomy 12/09/2019.   Follow up with Vascular - appt in ~2 weeks

## 2020-02-20 NOTE — Addendum Note (Signed)
Addended by: Marin Olp on: 02/20/2020 05:23 PM   Modules accepted: Orders

## 2020-02-26 NOTE — Progress Notes (Signed)
Cardiology Clinic Note   Patient Name: [name] [name] Date of Encounter: 02/27/2020  Primary Care Provider:  Marin Olp, MD Primary Cardiologist:  Skeet Latch, MD  Patient Profile    [name] [name] is a 86 year old female presents to the clinic today for follow-up evaluation of her hypertension.  Past Medical History    Past Medical History:  Diagnosis Date   Allergic rhinoconjunctivitis    Allergy    Anxiety    pt. managed- uses deep breathing    Arthritis    low back , stenosis   COLONIC POLYPS, RECURRENT 08/29/2006   2008 last colonoscopy. No further colonoscopy.      COPD (chronic obstructive pulmonary disease) (HCC)    Diverticulosis    Dysrhythmia    afib   Eczema    Environmental allergies    allergy shot- q friday in Dr. Janee Morn office. PFT's abnormal- recommended Spiriva to use preop & will d/c after surgery   GERD (gastroesophageal reflux disease)    Hiatal hernia    Hyperlipidemia    Hypertension    Lung nodule 2011   Paroxysmal atrial fibrillation ([place]) 11/27/2019   Peripheral vascular disease (HCC)    Shingles    Stenosis of popliteal artery (HCC)    blood clots in legs long ago     Trigeminal neuralgia    Left buttocks   Past Surgical History:  Procedure Laterality Date   ABDOMINAL AORTAGRAM N/A 06/17/2011   Procedure: ABDOMINAL Maxcine Ham;  Surgeon: Elam Dutch, MD;  Location: [facility] CATH LAB;  Service: Cardiovascular;  Laterality: N/A;   ABDOMINAL AORTOGRAM W/LOWER EXTREMITY Bilateral 06/22/2018   Procedure: ABDOMINAL AORTOGRAM W/LOWER EXTREMITY;  Surgeon: Elam Dutch, MD;  Location: [place] CV LAB;  Service: Cardiovascular;  Laterality: Bilateral;   ABDOMINAL AORTOGRAM W/LOWER EXTREMITY Bilateral 11/29/2019   Procedure: ABDOMINAL AORTOGRAM W/LOWER EXTREMITY;  Surgeon: Elam Dutch, MD;  Location: [place] CV LAB;  Service: Cardiovascular;  Laterality: Bilateral;   CATARACT EXTRACTION, BILATERAL      w IOL   CHOLECYSTECTOMY  1998   COLONOSCOPY     ENDARTERECTOMY FEMORAL Left 12/09/2019   Procedure: ENDARTERECTOMY FEMORAL with bovine patch angioplasty.;  Surgeon: Elam Dutch, MD;  Location: [place];  Service: Vascular;  Laterality: Left;   FEMORAL-POPLITEAL BYPASS GRAFT        x2 surgeries 1990's & 2009   FEMORAL-POPLITEAL BYPASS GRAFT Right 12/09/2019   Procedure: REDO RIGHT FEMORAL-POPLITEAL ARTERY BYPASS GRAFT;  Surgeon: Elam Dutch, MD;  Location: [place];  Service: Vascular;  Laterality: Right;   INJECTION KNEE Right Aug. 2016   Gel injection for pain   LUMBAR FUSION  07/06/2011   TONSILLECTOMY     as a teenager    TUBAL LIGATION      Allergies  Allergies  Allergen Reactions   Sulfa Antibiotics Other (See Comments)    Cold sweat light headed and disorientation   Tiotropium Bromide Shortness Of Breath and Other (See Comments)    Sore throat also   Latex Itching and Rash   Ultram [Tramadol] Nausea And Vomiting   5-Alpha Reductase Inhibitors     History of Present Illness    [name] [name] has a PMH of essential hypertension, peripheral arterial disease, paroxysmal atrial fibrillation, mixed COPD, CKD stage III, HLD and obesity.  She is status post redo femoropopliteal bypass grafting 12/09/2019.    She was last seen in the hospital 8/21 for evaluation of her A. fib with RVR at  the request of Dr. Oneida Alar.  She presented for surgical intervention of her redo right femoral below the knee femoropopliteal bypass.  She then developed rectal bleeding secondary to acute colitis.  Cardiology was then consulted to evaluate her atrial fibrillation with RVR.  She presents to the clinic today for follow-up evaluation states she feels well.  She has lost 14 pounds since being discharged from the hospital.  She has been wearing her lower extremity support stockings.  She is participating in physical therapy and exercise classes.  She does not feel any palpitations or  irregular heartbeats.  Her blood pressures been well controlled at home and 130s over 80s.  She was recently at her PCPs office and asks about continuing/the need to continue furosemide.  We will change her furosemide to as needed and use 150 pounds as her dry weight.  I have instructed her to take Lasix if her weight reaches 152 pounds overnight or if she notices 5 pound weight gain in 1 week.  Her EKG today shows a heart rate of 103.  After resting in the exam room her heart rate quickly decreased to 84 bpm.  I will give her the salty 6 diet sheet, [place] support stockings sheet, have her continue to increase her physical activity,  And have her follow-up in 6 months.  Today she denies chest pain, shortness of breath, lower extremity edema, fatigue, palpitations, melena, hematuria, hemoptysis, diaphoresis, weakness, presyncope, syncope, orthopnea, and PND.   Home Medications    Prior to Admission medications   Medication Sig Start Date End Date Taking? Authorizing Provider  acetaminophen (TYLENOL) 325 MG tablet Take 1-2 tablets (325-650 mg total) by mouth every 4 (four) hours as needed for mild pain (or temp >/= 101 F). 12/23/19   Angiulli, Lavon Paganini, PA-C  apixaban (ELIQUIS) 5 MG TABS tablet Take 1 tablet (5 mg total) by mouth 2 (two) times daily. 01/17/20   Skeet Latch, MD  atorvastatin (LIPITOR) 40 MG tablet Take 1 tablet (40 mg total) by mouth daily. 01/17/20   Marin Olp, MD  clonazePAM (KLONOPIN) 0.25 MG disintegrating tablet Take 1 tablet (0.25 mg total) by mouth at bedtime as needed (anxiety). 12/23/19   Angiulli, Lavon Paganini, PA-C  furosemide (LASIX) 20 MG tablet TAKE 1 TABLET BY MOUTH ONCE DAILY AS NEEDED INCREASED SWELLING 02/20/20   Marin Olp, MD  lamoTRIgine (LAMICTAL) 100 MG tablet Take 1 tablet (100 mg total) by mouth 2 (two) times daily. 12/23/19   Angiulli, Lavon Paganini, PA-C  metoprolol succinate (TOPROL XL) 25 MG 24 hr tablet Take 3 tablets (75 mg total) by mouth daily.  01/17/20   Skeet Latch, MD  Multiple Vitamin (MULTIVITAMIN WITH MINERALS) TABS tablet Take 1 tablet by mouth daily.    [provider]  pantoprazole (PROTONIX) 40 MG tablet Take one tablet twice daily as directed 02/18/20   Kozlow, Donnamarie Poag, MD  polycarbophil (FIBERCON) 625 MG tablet Take 1 tablet (625 mg total) by mouth daily. 12/23/19   Angiulli, Lavon Paganini, PA-C  RESTASIS 0.05 % ophthalmic emulsion Place 1 drop into both eyes 2 (two) times daily as needed (dry eyes).  01/31/18   [provider]    Family History    Family History  Problem Relation Age of Onset   Arthritis Mother    Hypertension Mother    Heart disease Father    Colon polyps Father    Breast cancer Sister    Breast cancer Daughter    Hypertension Son  Lung cancer Brother    Colon cancer Sister    Brain cancer Sister    Lung cancer Sister    Anesthesia problems Neg Hx    Hypotension Neg Hx    Malignant hyperthermia Neg Hx    Pseudochol deficiency Neg Hx    She indicated that her mother is deceased. She indicated that her father is deceased. She indicated that only one of her three sisters is alive. She indicated that the status of her brother is unknown. She indicated that all of her three daughters are alive. She indicated that her son is alive. She indicated that the status of her neg hx is unknown.  Social History    Social History   Socioeconomic History   Marital status: Single    Spouse name: Not on file   Number of children: 4   Years of education: 12   Highest education level: Not on file  Occupational History   Occupation: retired    Fish farm manager: RETIRED  Tobacco Use   Smoking status: Former Smoker    Packs/day: 2.00    Years: 30.00    Pack years: 60.00    Types: Cigarettes    Quit date: 06/18/1993    Years since quitting: 26.7   Smokeless tobacco: Never Used   Tobacco comment: QUIT IN 1995  Vaping Use   Vaping Use: Never used  Substance and Sexual  Activity   Alcohol use: No    Alcohol/week: 0.0 standard drinks   Drug use: No   Sexual activity: Not on file  Other Topics Concern   Not on file  Social History Narrative   Patient is widowed with 4 children. 2 grandkids. Lives alone.    Patient is right handed.   Patient has high school education.   Patient drinks 1 cup daily.      Retired from [employer] for 28 years, works 2 days a week until 2016.       Hobbies: volunteers at [employer], [employer] from church visits people.       No caffeine.    Social Determinants of Health   Financial Resource Strain: Low Risk    Difficulty of Paying Living Expenses: Not hard at all  Food Insecurity: No Food Insecurity   Worried About Charity fundraiser in the Last Year: Never true   [place] in the Last Year: Never true  Transportation Needs: No Transportation Needs   Lack of Transportation (Medical): No   Lack of Transportation (Non-Medical): No  Physical Activity: Sufficiently Active   Days of Exercise per Week: 7 days   Minutes of Exercise per Session: 30 min  Stress: No Stress Concern Present   Feeling of Stress : Not at all  Social Connections: Socially Isolated   Frequency of Communication with Friends and Family: More than three times a week   Frequency of Social Gatherings with Friends and Family: Never   Attends Religious Services: Never   Marine scientist or Organizations: No   Attends Archivist Meetings: Never   Marital Status: Widowed  Human resources officer Violence: Not At Risk   Fear of Current or Ex-Partner: No   Emotionally Abused: No   Physically Abused: No   Sexually Abused: No     Review of Systems    General:  No chills, fever, night sweats or weight changes.  Cardiovascular:  No chest pain, dyspnea on exertion, edema, orthopnea, palpitations, paroxysmal nocturnal dyspnea. Dermatological: No rash, lesions/masses  Respiratory: No cough,  dyspnea Urologic: No hematuria, dysuria Abdominal:   No nausea, vomiting, diarrhea, bright red blood per rectum, melena, or hematemesis Neurologic:  No visual changes, wkns, changes in mental status. All other systems reviewed and are otherwise negative except as noted above.  Physical Exam    VS:  BP 136/62 (BP Location: Left Arm, Patient Position: Sitting)    Pulse 84    Ht 4\' 11"  (1.499 m)    Wt 150 lb (68 kg)    SpO2 98%    BMI 30.30 kg/m  , BMI Body mass index is 30.3 kg/m. GEN: Well nourished, well developed, in no acute distress. HEENT: normal. Neck: Supple, no JVD, carotid bruits, or masses. Cardiac: RRR, no murmurs, rubs, or gallops. No clubbing, cyanosis, edema.  Radials/DP/PT 2+ and equal bilaterally.  Respiratory:  Respirations regular and unlabored, clear to auscultation bilaterally. GI: Soft, nontender, nondistended, BS + x 4. MS: no deformity or atrophy. Skin: warm and dry, no rash. Neuro:  Strength and sensation are intact. Psych: Normal affect.  Accessory Clinical Findings    Recent Labs: 11/27/2019: Magnesium 1.7; TSH 3.190 02/19/2020: ALT 12; BUN 24; Creat 1.19; Hemoglobin 12.7; Platelets 233; Potassium 3.9; Sodium 140   Recent Lipid Panel    Component Value Date/Time   CHOL 77 12/10/2019 0523   TRIG 74 12/10/2019 0523   TRIG 141 04/10/2006 0812   HDL 30 (L) 12/10/2019 0523   CHOLHDL 2.6 12/10/2019 0523   VLDL 15 12/10/2019 0523   LDLCALC 32 12/10/2019 0523   LDLDIRECT 46.0 06/10/2019 1132    ECG personally reviewed by me today-atrial fibrillation with rapid ventricular response 103 bpm- No acute changes  EKG 12/09/2019 Atrial fibrillation 87 bpm  Echocardiogram 11/27/2019 IMPRESSIONS    1. Left ventricular ejection fraction, by estimation, is 60 to 65%. The  left ventricle has normal function. The left ventricle has no regional  wall motion abnormalities. The left ventricular internal cavity size was  mildly dilated. Left ventricular  diastolic  function could not be evaluated.  2. Right ventricular systolic function is normal. The right ventricular  size is normal. There is mildly elevated pulmonary artery systolic  pressure.  3. Left atrial size was mildly dilated.  4. The mitral valve is normal in structure. No evidence of mitral valve  regurgitation. No evidence of mitral stenosis.  5. The aortic valve is grossly normal. Aortic valve regurgitation is not  visualized. No aortic stenosis is present.   Assessment & Plan   1.  Atrial fibrillation-EKG today shows atrial fibrillation with RVR 103 bpm.  Return to mid 80s after sitting in exam room for short period of time.  Had a bout of atrial fibrillation with RVR when she presented for her redo right femoral femoropopliteal bypass.  Her metoprolol was increased for better rate control.  Denies bleeding issues, injuries, and falls.  She remains asymptomatic Continue apixaban, metoprolol Heart healthy low-sodium diet-salty 6 given Increase physical activity as tolerated  Essential hypertension-BP today 136/62.  Well-controlled at home Continue metoprolol, irbesartan Heart healthy low-sodium diet-salty 6 given Increase physical activity as tolerated  Peripheral arterial disease-denies claudication.  Underwent redo femoropopliteal bypass grafting 12/09/2019. Continue atorvastatin, Heart healthy low-sodium high-fiber diet Increase physical activity as tolerated Follows with Dr. Oneida Alar  Disposition: Follow-up with Dr. Oval Linsey in 6 months.  Jossie Ng. Vaishnav Demartin NP-C    02/27/2020, 2:26 PM [address] Office [phone] Fax [phone]  Notice: This dictation was  prepared with Dragon dictation along with smaller phrase technology. Any transcriptional errors that result from this process are unintentional and may not be corrected upon review.

## 2020-02-26 NOTE — Progress Notes (Signed)
NEUROLOGY FOLLOW UP OFFICE NOTE  [name] [name] [id number]  HISTORY OF PRESENT ILLNESS: [name] [name] is a 86 year old female with COPD, CKD, PAD and history of DVTs and PAF on chronic Coumadin whofollows up for left-sided trigeminal neuralgia.  She is accompanied by her son who supplements history.  Hospital records reviewed.  UPDATE: Current medication:Lamotrigine 100mg  twice daily  Due to worsening claudication, she underwent redo of right femoral to below-knee popliteal artery bypass and left common femoral endarterectomy pericardial patch in August.  She was found to have ischemic colitis and atrial fibrillation with RVR.  She was placed on heparin and transitioned to Eliquis.    Since last visit, only one flare up when she was in the hospital.    02/19/2020 LABS:  CBC with WBC 8.3, HGB 12.7, HCT 39.6, PLT 233; CMP with Na 140, K 3.9, Cl 100, CO2 32, Ca 9.8, glucose 132, BUN 24, Cr 1.19, t bili 0.5, ALP 98, AST 18, ALT 12.  HISTORY: She was diagnosed with right-sided trigeminal neuralgiain 2016. She was treated at that time. She was treated with carbamazepine which was stopped due to interference with Coumadin and later gabapentin. It subsequently resolved.  In 2020, she began having left sided facial pain. This is worse than the previous episode. It mostly involves the cheek. It is sometimes a paroxysmal stabbing pain or a constant sharp electric pain. It is daily. It gets worse later in the day. Movement aggravates it. She cannot sleep, talk on the phone or eat. Staying still helps ease it. No numbness or tingling. No facial weakness. No associated rash. No change in hearing or vision.   Past medications: Gabapentin (unable to tolerate), carbamazepine (interfered with Coumadin),oxcarbazepine (drowsiness);Cymbalta (not used for this), tramadol(caused nausea); baclofen (for back pain)  PAST MEDICAL HISTORY: Past Medical History:  Diagnosis Date  .  Allergic rhinoconjunctivitis   . Allergy   . Anxiety    pt. managed- uses deep breathing   . Arthritis    low back , stenosis  . COLONIC POLYPS, RECURRENT 08/29/2006   2008 last colonoscopy. No further colonoscopy.     Marland Kitchen COPD (chronic obstructive pulmonary disease) ([place])   . Diverticulosis   . Dysrhythmia    afib  . Eczema   . Environmental allergies    allergy shot- q friday in Dr. Janee Morn office. PFT's abnormal- recommended Spiriva to use preop & will d/c after surgery  . GERD (gastroesophageal reflux disease)   . Hiatal hernia   . Hyperlipidemia   . Hypertension   . Lung nodule 2011  . Paroxysmal atrial fibrillation ([place]) 11/27/2019  . Peripheral vascular disease ([place])   . Shingles   . Stenosis of popliteal artery (HCC)    blood clots in legs long ago    . Trigeminal neuralgia    Left buttocks    MEDICATIONS: Current Outpatient Medications on File Prior to Visit  Medication Sig Dispense Refill  . acetaminophen (TYLENOL) 325 MG tablet Take 1-2 tablets (325-650 mg total) by mouth every 4 (four) hours as needed for mild pain (or temp >/= 101 F).    Marland Kitchen apixaban (ELIQUIS) 5 MG TABS tablet Take 1 tablet (5 mg total) by mouth 2 (two) times daily. 60 tablet 5  . atorvastatin (LIPITOR) 40 MG tablet Take 1 tablet (40 mg total) by mouth daily. 90 tablet 3  . clonazePAM (KLONOPIN) 0.25 MG disintegrating tablet Take 1 tablet (0.25 mg total) by mouth at bedtime as needed (anxiety). 20 tablet  0  . furosemide (LASIX) 20 MG tablet TAKE 1 TABLET BY MOUTH ONCE DAILY AS NEEDED INCREASED SWELLING 30 tablet 0  . lamoTRIgine (LAMICTAL) 100 MG tablet Take 1 tablet (100 mg total) by mouth 2 (two) times daily. 60 tablet 5  . metoprolol succinate (TOPROL XL) 25 MG 24 hr tablet Take 3 tablets (75 mg total) by mouth daily. 30 tablet 0  . Multiple Vitamin (MULTIVITAMIN WITH MINERALS) TABS tablet Take 1 tablet by mouth daily.    . pantoprazole (PROTONIX) 40 MG tablet Take one tablet twice daily as directed 60  tablet 11  . polycarbophil (FIBERCON) 625 MG tablet Take 1 tablet (625 mg total) by mouth daily. 30 tablet 0  . RESTASIS 0.05 % ophthalmic emulsion Place 1 drop into both eyes 2 (two) times daily as needed (dry eyes).   99   No current facility-administered medications on file prior to visit.    ALLERGIES: Allergies  Allergen Reactions  . Sulfa Antibiotics Other (See Comments)    Cold sweat light headed and disorientation  . Tiotropium Bromide Shortness Of Breath and Other (See Comments)    Sore throat also  . Latex Itching and Rash  . Ultram [Tramadol] Nausea And Vomiting  . 5-Alpha Reductase Inhibitors     FAMILY HISTORY: Family History  Problem Relation Age of Onset  . Arthritis Mother   . Hypertension Mother   . Heart disease Father   . Colon polyps Father   . Breast cancer Sister   . Breast cancer Daughter   . Hypertension Son   . Lung cancer Brother   . Colon cancer Sister   . Brain cancer Sister   . Lung cancer Sister   . Anesthesia problems Neg Hx   . Hypotension Neg Hx   . Malignant hyperthermia Neg Hx   . Pseudochol deficiency Neg Hx     SOCIAL HISTORY: Social History   Socioeconomic History  . Marital status: Single    Spouse name: Not on file  . Number of children: 4  . Years of education: 5  . Highest education level: Not on file  Occupational History  . Occupation: retired    Fish farm manager: RETIRED  Tobacco Use  . Smoking status: Former Smoker    Packs/day: 2.00    Years: 30.00    Pack years: 60.00    Types: Cigarettes    Quit date: 06/18/1993    Years since quitting: 26.7  . Smokeless tobacco: Never Used  . Tobacco comment: QUIT IN 1995  Vaping Use  . Vaping Use: Never used  Substance and Sexual Activity  . Alcohol use: No    Alcohol/week: 0.0 standard drinks  . Drug use: No  . Sexual activity: Not on file  Other Topics Concern  . Not on file  Social History Narrative   Patient is widowed with 4 children. 2 grandkids. Lives alone.     Patient is right handed.   Patient has high school education.   Patient drinks 1 cup daily.      Retired from [employer] for 28 years, works 2 days a week until 2016.       Hobbies: volunteers at [employer], [employer] from church visits people.       No caffeine.    Social Determinants of Health   Financial Resource Strain: Low Risk   . Difficulty of Paying Living Expenses: Not hard at all  Food Insecurity: No Food Insecurity  . Worried About Charity fundraiser  in the Last Year: Never true  . Ran Out of Food in the Last Year: Never true  Transportation Needs: No Transportation Needs  . Lack of Transportation (Medical): No  . Lack of Transportation (Non-Medical): No  Physical Activity: Sufficiently Active  . Days of Exercise per Week: 7 days  . Minutes of Exercise per Session: 30 min  Stress: No Stress Concern Present  . Feeling of Stress : Not at all  Social Connections: Socially Isolated  . Frequency of Communication with Friends and Family: More than three times a week  . Frequency of Social Gatherings with Friends and Family: Never  . Attends Religious Services: Never  . Active Member of Clubs or Organizations: No  . Attends Archivist Meetings: Never  . Marital Status: Widowed  Intimate Partner Violence: Not At Risk  . Fear of Current or Ex-Partner: No  . Emotionally Abused: No  . Physically Abused: No  . Sexually Abused: No    PHYSICAL EXAM: Blood pressure (!) 150/80, pulse (!) 111, height 4\' 11"  (1.499 m), weight 151 lb 3.2 oz (68.6 kg), SpO2 97 %. General: No acute distress.  Patient appears well-groomed.     IMPRESSION: 1.  Left sided trigeminal neuralgia.  Well-controlled.  She has inquired about discontinuing lamotrigine.  As we are going into winter, I recommended remaining on it for now as cold air may trigger it.  PLAN: 1.  Continue lamotrigine 100mg  twice daily 2.  Follow up in 6 months.  Revisit discontinuing lamotrigine at that  time.  Metta Clines, DO  CC: [name], MD

## 2020-02-27 ENCOUNTER — Ambulatory Visit (INDEPENDENT_AMBULATORY_CARE_PROVIDER_SITE_OTHER): Payer: Medicare Other | Admitting: General Practice

## 2020-02-27 ENCOUNTER — Other Ambulatory Visit: Payer: Self-pay

## 2020-02-27 ENCOUNTER — Encounter: Payer: Self-pay | Admitting: General Practice

## 2020-02-27 VITALS — BP 136/62 | HR 84 | Ht 59.0 in | Wt 150.0 lb

## 2020-02-27 DIAGNOSIS — I4819 Other persistent atrial fibrillation: Secondary | ICD-10-CM

## 2020-02-27 DIAGNOSIS — I739 Peripheral vascular disease, unspecified: Secondary | ICD-10-CM

## 2020-02-27 DIAGNOSIS — I10 Essential (primary) hypertension: Secondary | ICD-10-CM | POA: Diagnosis not present

## 2020-02-27 MED ORDER — FUROSEMIDE 20 MG PO TABS: 20.0000 mg | ORAL_TABLET | ORAL | 0 refills | Status: DC | PRN

## 2020-02-27 NOTE — Patient Instructions (Signed)
Medication Instructions:  may Furosemide 20mg  take if weight > 152 lbs overnight  *If you need a refill on your cardiac medications before your next appointment, please call your pharmacy*  Lab Work:   Testing/Procedures:  NONE    NONE  Special Instructions PLEASE PURCHASE AND WEAR COMPRESSION STOCKINGS DAILY AND OFF AT BEDTIME. Compression stockings are elastic socks that squeeze the legs. They help to increase blood flow to the legs and to decrease swelling in the legs from fluid retention, and reduce the chance of developing blood clots in the lower legs. Please put on in the AM when dressing and off at night when dressing for bed.  PLEASE MAKE SURE TO ELEVATE YOUR FEET & LEGS WHILE SITTING, THIS WILL HELP WITH THE SWELLING ALSO.    [employer]  [facility] [employer]  [place] ([place] [phone]); [place],  [address]; [phone]  EMAIL   [email or web address]  PLEASE READ AND FOLLOW SALTY 6-ATTACHED-1,800mg  daily  PLEASE INCREASE PHYSICAL ACTIVITY AS TOLERATED  Follow-Up: Your next appointment:  3 month(s) In Person with Skeet Latch, MD OR IF UNAVAILABLE [place], FNP-C  At [facility], you and your health needs are our priority.  As part of our continuing mission to provide you with exceptional heart care, we have created designated Provider Care Teams.  These Care Teams include your primary Cardiologist (physician) and Advanced Practice Providers (APPs -  Physician Assistants and Nurse Practitioners) who all work together to provide you with the care you need, when you need it.            6 SALTY THINGS TO AVOID    SODIUM 1,800MG  DAILY

## 2020-02-28 ENCOUNTER — Encounter: Payer: Self-pay | Admitting: Neurology

## 2020-02-28 ENCOUNTER — Ambulatory Visit (INDEPENDENT_AMBULATORY_CARE_PROVIDER_SITE_OTHER): Payer: Medicare Other | Admitting: Neurology

## 2020-02-28 VITALS — BP 150/80 | HR 111 | Ht 59.0 in | Wt 151.2 lb

## 2020-02-28 DIAGNOSIS — G500 Trigeminal neuralgia: Secondary | ICD-10-CM | POA: Diagnosis not present

## 2020-02-28 DIAGNOSIS — I779 Disorder of arteries and arterioles, unspecified: Secondary | ICD-10-CM

## 2020-02-28 NOTE — Patient Instructions (Signed)
1.  Continue lamotrigine for now 2.  Follow up in 6 months.  We can discuss getting off of the medication at that time

## 2020-03-02 ENCOUNTER — Telehealth: Payer: Self-pay

## 2020-03-02 NOTE — Telephone Encounter (Signed)
Returned call to Mrs. Klump in regards to [facility] PT/OT w/ [facility]. Called and faxed necessary paperwork to [facility]. Pt is aware

## 2020-03-06 ENCOUNTER — Telehealth: Payer: Self-pay | Admitting: Vascular Surgery

## 2020-03-06 DIAGNOSIS — I480 Paroxysmal atrial fibrillation: Secondary | ICD-10-CM | POA: Diagnosis not present

## 2020-03-06 DIAGNOSIS — R531 Weakness: Secondary | ICD-10-CM | POA: Diagnosis not present

## 2020-03-06 DIAGNOSIS — Z7901 Long term (current) use of anticoagulants: Secondary | ICD-10-CM | POA: Diagnosis not present

## 2020-03-06 DIAGNOSIS — N183 Chronic kidney disease, stage 3 unspecified: Secondary | ICD-10-CM | POA: Diagnosis not present

## 2020-03-06 DIAGNOSIS — J449 Chronic obstructive pulmonary disease, unspecified: Secondary | ICD-10-CM | POA: Diagnosis not present

## 2020-03-06 DIAGNOSIS — I129 Hypertensive chronic kidney disease with stage 1 through stage 4 chronic kidney disease, or unspecified chronic kidney disease: Secondary | ICD-10-CM | POA: Diagnosis not present

## 2020-03-06 DIAGNOSIS — Z95820 Peripheral vascular angioplasty status with implants and grafts: Secondary | ICD-10-CM | POA: Diagnosis not present

## 2020-03-06 DIAGNOSIS — Z87891 Personal history of nicotine dependence: Secondary | ICD-10-CM | POA: Diagnosis not present

## 2020-03-06 DIAGNOSIS — I70311 Atherosclerosis of unspecified type of bypass graft(s) of the extremities with intermittent claudication, right leg: Secondary | ICD-10-CM | POA: Diagnosis not present

## 2020-03-06 DIAGNOSIS — Z48812 Encounter for surgical aftercare following surgery on the circulatory system: Secondary | ICD-10-CM | POA: Diagnosis not present

## 2020-03-06 NOTE — Telephone Encounter (Signed)
Noted  

## 2020-03-06 NOTE — Telephone Encounter (Signed)
Per Laurey Arrow, [facility] [phone]); Pt is doing well. Will visit Pt one more time in two weeks./rval

## 2020-03-09 DIAGNOSIS — Z48812 Encounter for surgical aftercare following surgery on the circulatory system: Secondary | ICD-10-CM | POA: Diagnosis not present

## 2020-03-09 DIAGNOSIS — I480 Paroxysmal atrial fibrillation: Secondary | ICD-10-CM | POA: Diagnosis not present

## 2020-03-09 DIAGNOSIS — I70311 Atherosclerosis of unspecified type of bypass graft(s) of the extremities with intermittent claudication, right leg: Secondary | ICD-10-CM | POA: Diagnosis not present

## 2020-03-09 DIAGNOSIS — N183 Chronic kidney disease, stage 3 unspecified: Secondary | ICD-10-CM | POA: Diagnosis not present

## 2020-03-09 DIAGNOSIS — I129 Hypertensive chronic kidney disease with stage 1 through stage 4 chronic kidney disease, or unspecified chronic kidney disease: Secondary | ICD-10-CM | POA: Diagnosis not present

## 2020-03-09 DIAGNOSIS — R531 Weakness: Secondary | ICD-10-CM | POA: Diagnosis not present

## 2020-03-10 ENCOUNTER — Ambulatory Visit: Payer: Medicare Other | Admitting: Orthopaedic Surgery

## 2020-03-16 DIAGNOSIS — I129 Hypertensive chronic kidney disease with stage 1 through stage 4 chronic kidney disease, or unspecified chronic kidney disease: Secondary | ICD-10-CM | POA: Diagnosis not present

## 2020-03-16 DIAGNOSIS — I480 Paroxysmal atrial fibrillation: Secondary | ICD-10-CM | POA: Diagnosis not present

## 2020-03-16 DIAGNOSIS — N183 Chronic kidney disease, stage 3 unspecified: Secondary | ICD-10-CM | POA: Diagnosis not present

## 2020-03-16 DIAGNOSIS — I70311 Atherosclerosis of unspecified type of bypass graft(s) of the extremities with intermittent claudication, right leg: Secondary | ICD-10-CM | POA: Diagnosis not present

## 2020-03-16 DIAGNOSIS — R531 Weakness: Secondary | ICD-10-CM | POA: Diagnosis not present

## 2020-03-16 DIAGNOSIS — Z48812 Encounter for surgical aftercare following surgery on the circulatory system: Secondary | ICD-10-CM | POA: Diagnosis not present

## 2020-03-25 DIAGNOSIS — I129 Hypertensive chronic kidney disease with stage 1 through stage 4 chronic kidney disease, or unspecified chronic kidney disease: Secondary | ICD-10-CM | POA: Diagnosis not present

## 2020-03-25 DIAGNOSIS — I480 Paroxysmal atrial fibrillation: Secondary | ICD-10-CM | POA: Diagnosis not present

## 2020-03-25 DIAGNOSIS — I70311 Atherosclerosis of unspecified type of bypass graft(s) of the extremities with intermittent claudication, right leg: Secondary | ICD-10-CM | POA: Diagnosis not present

## 2020-03-25 DIAGNOSIS — Z48812 Encounter for surgical aftercare following surgery on the circulatory system: Secondary | ICD-10-CM | POA: Diagnosis not present

## 2020-03-25 DIAGNOSIS — R531 Weakness: Secondary | ICD-10-CM | POA: Diagnosis not present

## 2020-03-25 DIAGNOSIS — N183 Chronic kidney disease, stage 3 unspecified: Secondary | ICD-10-CM | POA: Diagnosis not present

## 2020-03-27 DIAGNOSIS — R531 Weakness: Secondary | ICD-10-CM | POA: Diagnosis not present

## 2020-03-27 DIAGNOSIS — Z48812 Encounter for surgical aftercare following surgery on the circulatory system: Secondary | ICD-10-CM | POA: Diagnosis not present

## 2020-03-27 DIAGNOSIS — N183 Chronic kidney disease, stage 3 unspecified: Secondary | ICD-10-CM | POA: Diagnosis not present

## 2020-03-27 DIAGNOSIS — I480 Paroxysmal atrial fibrillation: Secondary | ICD-10-CM | POA: Diagnosis not present

## 2020-03-27 DIAGNOSIS — I129 Hypertensive chronic kidney disease with stage 1 through stage 4 chronic kidney disease, or unspecified chronic kidney disease: Secondary | ICD-10-CM | POA: Diagnosis not present

## 2020-03-27 DIAGNOSIS — I70311 Atherosclerosis of unspecified type of bypass graft(s) of the extremities with intermittent claudication, right leg: Secondary | ICD-10-CM | POA: Diagnosis not present

## 2020-03-31 DIAGNOSIS — I480 Paroxysmal atrial fibrillation: Secondary | ICD-10-CM | POA: Diagnosis not present

## 2020-03-31 DIAGNOSIS — I70311 Atherosclerosis of unspecified type of bypass graft(s) of the extremities with intermittent claudication, right leg: Secondary | ICD-10-CM | POA: Diagnosis not present

## 2020-03-31 DIAGNOSIS — R531 Weakness: Secondary | ICD-10-CM | POA: Diagnosis not present

## 2020-03-31 DIAGNOSIS — Z48812 Encounter for surgical aftercare following surgery on the circulatory system: Secondary | ICD-10-CM | POA: Diagnosis not present

## 2020-03-31 DIAGNOSIS — N183 Chronic kidney disease, stage 3 unspecified: Secondary | ICD-10-CM | POA: Diagnosis not present

## 2020-03-31 DIAGNOSIS — I129 Hypertensive chronic kidney disease with stage 1 through stage 4 chronic kidney disease, or unspecified chronic kidney disease: Secondary | ICD-10-CM | POA: Diagnosis not present

## 2020-05-06 ENCOUNTER — Telehealth: Payer: Self-pay | Admitting: Orthopaedic Surgery

## 2020-05-06 NOTE — Telephone Encounter (Signed)
Pt called asking if she can get a synvisc injection.

## 2020-05-06 NOTE — Telephone Encounter (Signed)
Please submit for gel inj- Dr. [name].

## 2020-05-06 NOTE — Telephone Encounter (Signed)
Pt last came into the office 11/16 for a cortisone inj.  I scheduled pt for the 27th of this month for her Synvisc injection

## 2020-05-07 NOTE — Telephone Encounter (Signed)
Noted  

## 2020-05-08 ENCOUNTER — Other Ambulatory Visit: Payer: Self-pay | Admitting: Cardiovascular Disease

## 2020-05-14 ENCOUNTER — Other Ambulatory Visit: Payer: Self-pay

## 2020-05-14 ENCOUNTER — Ambulatory Visit (INDEPENDENT_AMBULATORY_CARE_PROVIDER_SITE_OTHER): Payer: Medicare Other | Admitting: Vascular Surgery

## 2020-05-14 ENCOUNTER — Ambulatory Visit (HOSPITAL_COMMUNITY)
Admission: RE | Admit: 2020-05-14 | Discharge: 2020-05-14 | Disposition: A | Discharge status: Home / Self Care | Payer: Medicare Other | Source: Ambulatory Visit | Attending: Vascular Surgery | Admitting: Vascular Surgery

## 2020-05-14 ENCOUNTER — Ambulatory Visit (INDEPENDENT_AMBULATORY_CARE_PROVIDER_SITE_OTHER)
Admission: RE | Admit: 2020-05-14 | Discharge: 2020-05-14 | Disposition: A | Discharge status: Home / Self Care | Payer: Medicare Other | Source: Ambulatory Visit | Attending: Vascular Surgery | Admitting: Vascular Surgery

## 2020-05-14 ENCOUNTER — Encounter: Payer: Self-pay | Admitting: Vascular Surgery

## 2020-05-14 VITALS — BP 150/87 | HR 82 | Temp 97.8°F | Resp 20 | Ht 59.0 in | Wt 153.0 lb

## 2020-05-14 DIAGNOSIS — I739 Peripheral vascular disease, unspecified: Secondary | ICD-10-CM | POA: Diagnosis not present

## 2020-05-14 DIAGNOSIS — Z95828 Presence of other vascular implants and grafts: Secondary | ICD-10-CM | POA: Insufficient documentation

## 2020-05-14 NOTE — Progress Notes (Signed)
Patient is a 87 year old female who returns for follow-up today.  Most recently she underwent redo right femoral to below-knee popliteal bypass and left femoral endarterectomy August 2021.  She reports no claudication symptoms.  She is ambulatory with only occasional use of a cane or walker.  She has some pain numbness and tingling in her first toe bilaterally.  This is intermittent in nature.  She has no incisional drainage.  Overall she lives fairly independently with some assistance from her children.  She is on Eliquis and a statin.  Review of systems: She has shortness of breath with exertion.  She has no chest pain.  Current Outpatient Medications on File Prior to Visit  Medication Sig Dispense Refill  . acetaminophen (TYLENOL) 325 MG tablet Take 1-2 tablets (325-650 mg total) by mouth every 4 (four) hours as needed for mild pain (or temp >/= 101 F).    Marland Kitchen apixaban (ELIQUIS) 5 MG TABS tablet Take 1 tablet (5 mg total) by mouth 2 (two) times daily. 60 tablet 5  . atorvastatin (LIPITOR) 40 MG tablet Take 1 tablet (40 mg total) by mouth daily. 90 tablet 3  . clonazePAM (KLONOPIN) 0.25 MG disintegrating tablet Take 1 tablet (0.25 mg total) by mouth at bedtime as needed (anxiety). 20 tablet 0  . furosemide (LASIX) 20 MG tablet Take 1 tablet (20 mg total) by mouth as needed (may take if weight > 152 lbs). 30 tablet 0  . lamoTRIgine (LAMICTAL) 100 MG tablet Take 1 tablet (100 mg total) by mouth 2 (two) times daily. 60 tablet 5  . metoprolol succinate (TOPROL-XL) 25 MG 24 hr tablet Take 3 tablets by mouth once daily 90 tablet 0  . Multiple Vitamin (MULTIVITAMIN WITH MINERALS) TABS tablet Take 1 tablet by mouth daily.    . pantoprazole (PROTONIX) 40 MG tablet Take one tablet twice daily as directed 60 tablet 11  . polycarbophil (FIBERCON) 625 MG tablet Take 1 tablet (625 mg total) by mouth daily. 30 tablet 0  . RESTASIS 0.05 % ophthalmic emulsion Place 1 drop into both eyes 2 (two) times daily as needed  (dry eyes).   99   No current facility-administered medications on file prior to visit.    Past Medical History:  Diagnosis Date  . Allergic rhinoconjunctivitis   . Allergy   . Anxiety    pt. managed- uses deep breathing   . Arthritis    low back , stenosis  . COLONIC POLYPS, RECURRENT 08/29/2006   2008 last colonoscopy. No further colonoscopy.     Marland Kitchen COPD (chronic obstructive pulmonary disease) ([place])   . Diverticulosis   . Dysrhythmia    afib  . Eczema   . Environmental allergies    allergy shot- q friday in Dr. Janee Morn office. PFT's abnormal- recommended Spiriva to use preop & will d/c after surgery  . GERD (gastroesophageal reflux disease)   . Hiatal hernia   . Hyperlipidemia   . Hypertension   . Lung nodule 2011  . Paroxysmal atrial fibrillation ([place]) 11/27/2019  . Peripheral vascular disease ([place])   . Shingles   . Stenosis of popliteal artery (HCC)    blood clots in legs long ago    . Trigeminal neuralgia    Left buttocks   Past Surgical History:  Procedure Laterality Date  . ABDOMINAL AORTAGRAM N/A 06/17/2011   Procedure: ABDOMINAL Maxcine Ham;  Surgeon: Elam Dutch, MD;  Location: [facility] CATH LAB;  Service: Cardiovascular;  Laterality: N/A;  . ABDOMINAL AORTOGRAM W/LOWER EXTREMITY Bilateral  06/22/2018   Procedure: ABDOMINAL AORTOGRAM W/LOWER EXTREMITY;  Surgeon: Elam Dutch, MD;  Location: [place] CV LAB;  Service: Cardiovascular;  Laterality: Bilateral;  . ABDOMINAL AORTOGRAM W/LOWER EXTREMITY Bilateral 11/29/2019   Procedure: ABDOMINAL AORTOGRAM W/LOWER EXTREMITY;  Surgeon: Elam Dutch, MD;  Location: [place] CV LAB;  Service: Cardiovascular;  Laterality: Bilateral;  . CATARACT EXTRACTION, BILATERAL     w IOL  . CHOLECYSTECTOMY  1998  . COLONOSCOPY    . ENDARTERECTOMY FEMORAL Left 12/09/2019   Procedure: ENDARTERECTOMY FEMORAL with bovine patch angioplasty.;  Surgeon: Elam Dutch, MD;  Location: [facility] OR;  Service: Vascular;  Laterality: Left;  .  FEMORAL-POPLITEAL BYPASS GRAFT        x2 surgeries 1990's & 2009  . FEMORAL-POPLITEAL BYPASS GRAFT Right 12/09/2019   Procedure: REDO RIGHT FEMORAL-POPLITEAL ARTERY BYPASS GRAFT;  Surgeon: Elam Dutch, MD;  Location: [place];  Service: Vascular;  Laterality: Right;  . INJECTION KNEE Right Aug. 2016   Gel injection for pain  . LUMBAR FUSION  07/06/2011  . TONSILLECTOMY     as a teenager   . TUBAL LIGATION      Physical exam:  Vitals:   05/14/20 1408  BP: (!) 150/87  Pulse: 82  Resp: 20  Temp: 97.8 F (36.6 C)  SpO2: 96%  Weight: 153 lb (69.4 kg)  Height: 4\' 11"  (1.499 m)    Extremities: Absent pedal pulses right foot, 2+ left dorsalis pedis pulse, feet are pink and warm  Data: Patient had duplex ultrasound today which showed biphasic flow and widely patent bypass grafts bilaterally.  Right-sided ABI was 0.97 left-sided 1.29  Assessment: Patent bilateral extremity bypass grafts.  Doing well overall.  Plan: Patient will have a follow-up visit in our APP clinic with bilateral duplex and ABIs in 1 year.  [name], MD [facility] Office: [phone]

## 2020-05-19 ENCOUNTER — Telehealth: Payer: Self-pay

## 2020-05-19 NOTE — Telephone Encounter (Signed)
Pt will need OV in order for Dr. Yong Channel to evaluate and give advice, please schedule pt.

## 2020-05-19 NOTE — Telephone Encounter (Signed)
Patient has been scheduled

## 2020-05-19 NOTE — Telephone Encounter (Signed)
Submitted VOB, SynviscOne, right knee. 

## 2020-05-19 NOTE — Telephone Encounter (Signed)
Patient called in and stated that she has a lump in her left groin area for over a week. Tried to offer appt to the patient she stated she rather just wanted Dr. Ronney Lion opinion.

## 2020-05-20 ENCOUNTER — Telehealth: Payer: Self-pay

## 2020-05-20 NOTE — Telephone Encounter (Signed)
Approved for SynviscOne, right knee. [employer] deductible first ($233.00) Covered at 100% after Medicare through secondary insurance (BCBS) No Co-pay No PA required  Appt. 06/02/2020 with Dr. [name]

## 2020-05-21 ENCOUNTER — Ambulatory Visit: Payer: Medicare Other | Admitting: Orthopaedic Surgery

## 2020-05-21 NOTE — Progress Notes (Signed)
Phone [phone] In person visit   Subjective:   [name] [name] is a 87 y.o. year old very pleasant female patient who presents for/with See problem oriented charting Chief Complaint  Patient presents with  . Groin Swelling    Patient has a lump in her left groin area for about a week. No pain at all, patient states that when she noticed the lump it was larger and soft but not it has gotten smaller and "hard". The patient states in the beginning the lump burned but now it doesn't    This visit occurred during the SARS-CoV-2 public health emergency.  Safety protocols were in place, including screening questions prior to the visit, additional usage of staff PPE, and extensive cleaning of exam room while observing appropriate contact time as indicated for disinfecting solutions.   Past Medical History-  Patient Active Problem List   Diagnosis Date Noted  . Paroxysmal atrial fibrillation ([place]) 11/27/2019    Priority: High  . Pain in right ankle and joints of right foot 08/28/2014    Priority: High  . Peripheral arterial disease ([place]) 06/29/2007    Priority: High  . Left groin hernia 05/22/2020    Priority: Medium  . CKD (chronic kidney disease), stage III ([place]) 08/07/2017    Priority: Medium  . Acute gout of left foot 03/22/2016    Priority: Medium  . Trigeminal neuralgia     Priority: Medium  . Chronic low back pain 09/13/2010    Priority: Medium  . COPD mixed type ([place]) 01/22/2010    Priority: Medium  . Insulin resistance 01/06/2009    Priority: Medium  . Hyperlipidemia 10/23/2006    Priority: Medium  . Essential hypertension 10/23/2006    Priority: Medium  . ESOPHAGEAL STRICTURE 08/09/1993    Priority: Medium  . Secondary hypercoagulable state ([place]) 12/03/2019    Priority: Low  . Obesity (BMI 30.0-34.9) 11/14/2017    Priority: Low  . Unilateral primary osteoarthritis, right knee 04/07/2016    Priority: Low  . Encounter for therapeutic drug monitoring 10/21/2013     Priority: Low  . Syncope 12/29/2011    Priority: Low  . Allergic rhinitis due to pollen 06/25/2010    Priority: Low  . Solitary pulmonary nodule 01/12/2010    Priority: Low  . Osteopenia 01/06/2009    Priority: Low  . Eczema 11/26/2007    Priority: Low  . Sleep disturbance 01/09/2020  . Acute lower UTI   . Anxiety state   . Peripheral edema   . Leukocytosis   . Debility 12/13/2019  . Dyslipidemia   . Rectal bleeding   . Acute blood loss anemia   . Ischemic colitis ([place])   . Status post femoral-popliteal bypass surgery 12/09/2019  . Chronic bilateral low back pain with right-sided sciatica 08/29/2019  . Chronic pain of right knee 08/29/2019  . Abnormality of gait 05/30/2019  . Neuropathic pain 05/30/2019  . Myalgia 08/08/2018  . Chronic pain syndrome 06/27/2018  . Gross hematuria 06/01/2018  . Failed back syndrome 09/28/2017    Medications- reviewed and updated Current Outpatient Medications  Medication Sig Dispense Refill  . acetaminophen (TYLENOL) 325 MG tablet Take 1-2 tablets (325-650 mg total) by mouth every 4 (four) hours as needed for mild pain (or temp >/= 101 F).    Marland Kitchen apixaban (ELIQUIS) 5 MG TABS tablet Take 1 tablet (5 mg total) by mouth 2 (two) times daily. 60 tablet 5  . atorvastatin (LIPITOR) 40 MG tablet Take 1 tablet (40 mg  total) by mouth daily. 90 tablet 3  . clonazePAM (KLONOPIN) 0.25 MG disintegrating tablet Take 1 tablet (0.25 mg total) by mouth at bedtime as needed (anxiety). 20 tablet 0  . furosemide (LASIX) 20 MG tablet Take 1 tablet (20 mg total) by mouth as needed (may take if weight > 152 lbs). 30 tablet 0  . lamoTRIgine (LAMICTAL) 100 MG tablet Take 1 tablet (100 mg total) by mouth 2 (two) times daily. 60 tablet 5  . metoprolol succinate (TOPROL-XL) 25 MG 24 hr tablet Take 3 tablets by mouth once daily 90 tablet 0  . Multiple Vitamin (MULTIVITAMIN WITH MINERALS) TABS tablet Take 1 tablet by mouth daily.    . pantoprazole (PROTONIX) 40 MG tablet  Take one tablet twice daily as directed 60 tablet 11  . polycarbophil (FIBERCON) 625 MG tablet Take 1 tablet (625 mg total) by mouth daily. 30 tablet 0  . RESTASIS 0.05 % ophthalmic emulsion Place 1 drop into both eyes 2 (two) times daily as needed (dry eyes).   99   No current facility-administered medications for this visit.     Objective:  BP 134/72   Pulse 85   Temp 98.4 F (36.9 C) (Temporal)   Ht 4\' 11"  (1.499 m)   Wt 152 lb 9.6 oz (69.2 kg)   SpO2 95%   BMI 30.82 kg/m  Gen: NAD, resting comfortably CV: RRR no murmurs rubs or gallops Lungs: CTAB no crackles, wheeze, rhonchi Abdomen: soft/nontender/nondistended/normal bowel sounds.  Left groin bulge noted consistent with hernia- worse with valsalva- improves wth laying down Ext: no edema Skin: warm, dry     Assessment and Plan  Lump in Left Groin/groin swelling S:patient noted left groin lump about a week ago in the shower. Started out soft about half an egg size.  No pain noted at present, did burn at first, slightly painful. Has decreased in size but feels more firm at this point. No fever/chills- has felt well in general.  A/P: left groin hernia noted today- discussed option of surgical referral- will hold off for now. She has been doing light weight lifting- she can continue this unless she has worsening pain in that area. Discussed emergent precautions such as severe pain that does not resolve with laying down.    Recommended follow up: keep April visit Future Appointments  Date Time Provider [place]  06/02/2020  9:30 AM Leandrew Koyanagi, MD [facility] None  06/25/2020  4:00 PM Skeet Latch, MD [facility]  08/18/2020 11:00 AM Jamse Arn, MD [facility] CPR  08/20/2020 11:20 AM Marin Olp, MD [facility] PEC  08/28/2020 10:10 AM Pieter Partridge, DO [facility] None  01/21/2021 11:00 AM [facility] HEALTH COACH [facility] PEC  02/23/2021 11:30 AM Kozlow, Donnamarie Poag, MD [facility] None    Lab/Order associations:    ICD-10-CM   1. Left groin hernia  K40.90    Time Spent: 22 minutes of total time (9:54 PM- 10:16 AM) was spent on the date of the encounter performing the following actions: chart review prior to seeing the patient, obtaining history, performing a medically necessary exam, counseling on the treatment plan, placing orders, and documenting in our EHR.   Return precautions advised.  [name], MD

## 2020-05-21 NOTE — Patient Instructions (Addendum)
left groin hernia noted today- discussed option of surgical referral- will hold off for now. She has been doing light weight lifting- she can continue this unless she has worsening pain in that area. Discussed emergent precautions such as severe pain that does not resolve with laying down.    If this significantly enlarges or you have worsening pain we can refer to general surgery for consult but we would need a lot of clearance before surgery.   Recommended follow up: keep visit in April

## 2020-05-22 ENCOUNTER — Other Ambulatory Visit: Payer: Self-pay

## 2020-05-22 ENCOUNTER — Encounter: Payer: Self-pay | Admitting: Family Medicine

## 2020-05-22 ENCOUNTER — Ambulatory Visit (INDEPENDENT_AMBULATORY_CARE_PROVIDER_SITE_OTHER): Payer: Medicare Other | Admitting: Family Medicine

## 2020-05-22 VITALS — BP 134/72 | HR 85 | Temp 98.4°F | Ht 59.0 in | Wt 152.6 lb

## 2020-05-22 DIAGNOSIS — K4090 Unilateral inguinal hernia, without obstruction or gangrene, not specified as recurrent: Secondary | ICD-10-CM | POA: Diagnosis not present

## 2020-05-22 NOTE — Assessment & Plan Note (Signed)
Lump in Left Groin/groin swelling S:patient noted left groin lump about a week ago in the shower. Started out soft about half an egg size.  No pain noted at present, did burn at first, slightly painful. Has decreased in size but feels more firm at this point. No fever/chills- has felt well in general.  A/P: left groin hernia noted today- discussed option of surgical referral- will hold off for now. She has been doing light weight lifting- she can continue this unless she has worsening pain in that area. Discussed emergent precautions such as severe pain that does not resolve with laying down.

## 2020-06-02 ENCOUNTER — Encounter: Payer: Self-pay | Admitting: Orthopaedic Surgery

## 2020-06-02 ENCOUNTER — Other Ambulatory Visit: Payer: Self-pay

## 2020-06-02 ENCOUNTER — Ambulatory Visit (INDEPENDENT_AMBULATORY_CARE_PROVIDER_SITE_OTHER): Payer: Medicare Other | Admitting: Orthopaedic Surgery

## 2020-06-02 DIAGNOSIS — M1711 Unilateral primary osteoarthritis, right knee: Secondary | ICD-10-CM | POA: Diagnosis not present

## 2020-06-02 MED ORDER — LIDOCAINE HCL 1 % IJ SOLN
5.0000 mL | INTRAMUSCULAR | Status: AC | PRN
  Administered 2020-06-02: 5 mL

## 2020-06-02 MED ORDER — HYLAN G-F 20 48 MG/6ML IX SOSY
48.0000 mg | PREFILLED_SYRINGE | INTRA_ARTICULAR | Status: AC | PRN
  Administered 2020-06-02: 48 mg via INTRA_ARTICULAR

## 2020-06-02 NOTE — Progress Notes (Signed)
Office Visit Note   Patient: [name] [name]           Date of Birth: 03-24-1925           MRN: [id number] Visit Date: 06/02/2020              Requested by: Marin Olp, MD [place],  [place] [address] PCP: Marin Olp, MD   Assessment & Plan: Visit Diagnoses:  1. Primary osteoarthritis of right knee     Plan: Right knee injected with Synvisc today.  No complications.  Follow-up as needed.  Follow-Up Instructions: No follow-ups on file.   Orders:  No orders of the defined types were placed in this encounter.  No orders of the defined types were placed in this encounter.     Procedures: Large Joint Inj: R knee on 06/02/2020 9:52 AM Indications: pain Details: 22 G needle  Arthrogram: No  Medications: 48 mg Hylan 48 MG/6ML; 5 mL lidocaine 1 % Outcome: tolerated well, no immediate complications Patient was prepped and draped in the usual sterile fashion.       Clinical Data: No additional findings.   Subjective: Chief Complaint  Patient presents with  . Right Knee - Pain, Injections    Ms. Meras returns today for repeat Synvisc injection for the right knee.  Previous injection gave her about 8 to 9 months relief and she feels that it is really helping in terms of decreasing the amount of medication she has to take.  No changes otherwise.   Review of Systems   Objective: Vital Signs: There were no vitals taken for this visit.  Physical Exam  Ortho Exam Right knee exam is unchanged. Specialty Comments:  No specialty comments available.  Imaging: No results found.   PMFS History: Patient Active Problem List   Diagnosis Date Noted  . Primary osteoarthritis of right knee 06/02/2020  . Left groin hernia 05/22/2020  . Sleep disturbance 01/09/2020  . Acute lower UTI   . Anxiety state   . Peripheral edema   . Leukocytosis   . Debility 12/13/2019  . Dyslipidemia   . Rectal bleeding   . Acute blood loss anemia   .  Ischemic colitis ([place])   . Status post femoral-popliteal bypass surgery 12/09/2019  . Secondary hypercoagulable state ([place]) 12/03/2019  . Paroxysmal atrial fibrillation ([place]) 11/27/2019  . Chronic bilateral low back pain with right-sided sciatica 08/29/2019  . Chronic pain of right knee 08/29/2019  . Abnormality of gait 05/30/2019  . Neuropathic pain 05/30/2019  . Myalgia 08/08/2018  . Chronic pain syndrome 06/27/2018  . Gross hematuria 06/01/2018  . Obesity (BMI 30.0-34.9) 11/14/2017  . Failed back syndrome 09/28/2017  . CKD (chronic kidney disease), stage III ([place]) 08/07/2017  . Unilateral primary osteoarthritis, right knee 04/07/2016  . Acute gout of left foot 03/22/2016  . Pain in right ankle and joints of right foot 08/28/2014  . Trigeminal neuralgia   . Encounter for therapeutic drug monitoring 10/21/2013  . Syncope 12/29/2011  . Chronic low back pain 09/13/2010  . Allergic rhinitis due to pollen 06/25/2010  . COPD mixed type ([place]) 01/22/2010  . Solitary pulmonary nodule 01/12/2010  . Insulin resistance 01/06/2009  . Osteopenia 01/06/2009  . Eczema 11/26/2007  . Peripheral arterial disease ([place]) 06/29/2007  . Hyperlipidemia 10/23/2006  . Essential hypertension 10/23/2006  . ESOPHAGEAL STRICTURE 08/09/1993   Past Medical History:  Diagnosis Date  . Allergic rhinoconjunctivitis   . Allergy   .  Anxiety    pt. managed- uses deep breathing   . Arthritis    low back , stenosis  . COLONIC POLYPS, RECURRENT 08/29/2006   2008 last colonoscopy. No further colonoscopy.     Marland Kitchen COPD (chronic obstructive pulmonary disease) ([place])   . Diverticulosis   . Dysrhythmia    afib  . Eczema   . Environmental allergies    allergy shot- q friday in Dr. Janee Morn office. PFT's abnormal- recommended Spiriva to use preop & will d/c after surgery  . GERD (gastroesophageal reflux disease)   . Hiatal hernia   . Hyperlipidemia   . Hypertension   . Lung nodule 2011  . Paroxysmal atrial  fibrillation ([place]) 11/27/2019  . Peripheral vascular disease ([place])   . Shingles   . Stenosis of popliteal artery (HCC)    blood clots in legs long ago    . Trigeminal neuralgia    Left buttocks    Family History  Problem Relation Age of Onset  . Arthritis Mother   . Hypertension Mother   . Heart disease Father   . Colon polyps Father   . Breast cancer Sister   . Breast cancer Daughter   . Hypertension Son   . Lung cancer Brother   . Colon cancer Sister   . Brain cancer Sister   . Lung cancer Sister   . Anesthesia problems Neg Hx   . Hypotension Neg Hx   . Malignant hyperthermia Neg Hx   . Pseudochol deficiency Neg Hx     Past Surgical History:  Procedure Laterality Date  . ABDOMINAL AORTAGRAM N/A 06/17/2011   Procedure: ABDOMINAL Maxcine Ham;  Surgeon: Elam Dutch, MD;  Location: [facility] CATH LAB;  Service: Cardiovascular;  Laterality: N/A;  . ABDOMINAL AORTOGRAM W/LOWER EXTREMITY Bilateral 06/22/2018   Procedure: ABDOMINAL AORTOGRAM W/LOWER EXTREMITY;  Surgeon: Elam Dutch, MD;  Location: [place] CV LAB;  Service: Cardiovascular;  Laterality: Bilateral;  . ABDOMINAL AORTOGRAM W/LOWER EXTREMITY Bilateral 11/29/2019   Procedure: ABDOMINAL AORTOGRAM W/LOWER EXTREMITY;  Surgeon: Elam Dutch, MD;  Location: [place] CV LAB;  Service: Cardiovascular;  Laterality: Bilateral;  . CATARACT EXTRACTION, BILATERAL     w IOL  . CHOLECYSTECTOMY  1998  . COLONOSCOPY    . ENDARTERECTOMY FEMORAL Left 12/09/2019   Procedure: ENDARTERECTOMY FEMORAL with bovine patch angioplasty.;  Surgeon: Elam Dutch, MD;  [facility];  Service: Vascular;  Laterality: Left;  . FEMORAL-POPLITEAL BYPASS GRAFT        x2 surgeries 1990's & 2009  . FEMORAL-POPLITEAL BYPASS GRAFT Right 12/09/2019   Procedure: REDO RIGHT FEMORAL-POPLITEAL ARTERY BYPASS GRAFT;  Surgeon: Elam Dutch, MD;  Location: [place];  Service: Vascular;  Laterality: Right;  . INJECTION KNEE Right Aug. 2016   Gel  injection for pain  . LUMBAR FUSION  07/06/2011  . TONSILLECTOMY     as a teenager   . TUBAL LIGATION     Social History   Occupational History  . Occupation: retired    Fish farm manager: RETIRED  Tobacco Use  . Smoking status: Former Smoker    Packs/day: 2.00    Years: 30.00    Pack years: 60.00    Types: Cigarettes    Quit date: 06/18/1993    Years since quitting: 26.9  . Smokeless tobacco: Never Used  . Tobacco comment: QUIT IN 1995  Vaping Use  . Vaping Use: Never used  Substance and Sexual Activity  . Alcohol use: No    Alcohol/week: 0.0 standard  drinks  . Drug use: No  . Sexual activity: Not on file

## 2020-06-04 ENCOUNTER — Ambulatory Visit: Payer: Medicare Other | Admitting: Cardiovascular Disease

## 2020-06-19 ENCOUNTER — Other Ambulatory Visit: Payer: Self-pay | Admitting: Cardiovascular Disease

## 2020-06-25 ENCOUNTER — Encounter: Payer: Self-pay | Admitting: Cardiovascular Disease

## 2020-06-25 ENCOUNTER — Ambulatory Visit (INDEPENDENT_AMBULATORY_CARE_PROVIDER_SITE_OTHER): Payer: Medicare Other | Admitting: Cardiovascular Disease

## 2020-06-25 ENCOUNTER — Other Ambulatory Visit: Payer: Self-pay

## 2020-06-25 VITALS — BP 148/88 | HR 71 | Ht 59.0 in | Wt 154.6 lb

## 2020-06-25 DIAGNOSIS — I10 Essential (primary) hypertension: Secondary | ICD-10-CM

## 2020-06-25 NOTE — Patient Instructions (Addendum)
Medication Instructions:  Your physician recommends that you continue on your current medications as directed. Please refer to the Current Medication list given to you today.  *If you need a refill on your cardiac medications before your next appointment, please call your pharmacy*  Lab Work: NONE   Testing/Procedures: NONE   Follow-Up: At [facility], you and your health needs are our priority.  As part of our continuing mission to provide you with exceptional heart care, we have created designated Provider Care Teams.  These Care Teams include your primary Cardiologist (physician) and Advanced Practice Providers (APPs -  Physician Assistants and Nurse Practitioners) who all work together to provide you with the care you need, when you need it.  We recommend signing up for the patient portal called "MyChart".  Sign up information is provided on this After Visit Summary.  MyChart is used to connect with patients for Virtual Visits (Telemedicine).  Patients are able to view lab/test results, encounter notes, upcoming appointments, etc.  Non-urgent messages can be sent to your provider as well.   To learn more about what you can do with MyChart, go to [email or web address].    Your next appointment:   6 month(s)  The format for your next appointment:   In Person  Provider:   You may see Skeet Latch, MD or one of the following Advanced Practice Providers on your designated Care Team:    D[place], PA-C  [name], FNP  Your physician recommends that you schedule a follow-up appointment in: 07/27/2020 AT 10:30 AM WITH PHARM D   MONITOR AND LOG YOUR BLOOD PRESSURE  BRING READINGS TO YOUR FOLLOW UP

## 2020-06-25 NOTE — Progress Notes (Signed)
Cardiology Office Note   Date:  06/25/2020   ID:  [name] [name], DOB [birth year], MRN [id number]  PCP:  Marin Olp, MD  Cardiologist:   Skeet Latch, MD   No chief complaint on file.    History of Present Illness: [name] [name] is a 87 y.o. female with hypertension, hyperlipidemia, COPD, and PAD status post right femoral to above-knee popliteal bypass (2006) who is being seen today for the evaluation of presurgical risk assessment at the request of Marin Olp, MD.  Lately it has been very hard for her to walk.  She feels like she has constant leg cramps.  This has been getting gradually worse for the last 3-4 months.  She has not been as active since COVID-19.  She rides an exercise bike and has no exertional chest pain or shortness of breath.  She sometimes has lower extremity edema but this improves with elevation of her legs.  She has no orthopnea or PND.  She denies any palpitations, lightheadedness, or dizziness.  She notes that she has been somewhat more unsteady since she was started on lamotrigine for her trigeminal neuralgia.  She drinks 1 cup of decaffeinated coffee and does not drink alcohol.  She has a known stenosis of her peripheral bypass based on an arteriogram 05/2018 which revealed 80% narrowing of the mid right femoral-popliteal bypass graft.  She also had 70% stenosis of her left common femoral..  She previously deferred intervention because of concern about having an operation at her age.  However she has had increasing symptoms of claudication and last saw Dr. Oneida Alar on 7/29.  She previously underwent revision of her femoral to above-knee bypass in 2010.  This was extended to below the knee popliteal artery.  She is on chronic warfarin therapy since her thrombolysis procedure several years ago.  She is scheduled to undergo aortogram with lower extremity runoff on 11/29/2019.  She was referred to cardiology for presurgical risk assessment prior to redo of  her right leg bypass on 8/16.  Ms.Buel had a stress echo 12/2011 that revealed normal systolic function and no ischemia.  Ms. Hoeppner was first seen in 2021 for preoperative risk assessment prior to redo femoral-popliteal bypass grafting.  At that appointment she was noted to be in atrial fibrillation, which was a new finding.  She was started on metoprolol for rate control and was already on warfarin.  After her surgery she developed rectal bleeding due to acute colitis.  That hospitalization was also complicated by atrial fibrillation with RVR.  She followed up with [name], NP on 02/2020 and was doing well.  Prior to that appointment her furosemide was increased due to volume overload.  Echo 11/2019 revealed LVEF 60 to 65% with mild left atrial enlargement and no other significant abnormalities.  She had ABIs 04/2020 which were 0.97 on the right and 1.29 on the left.  Arterial Dopplers revealed patent bypass grafts without stenosis. She has no claudication.  She exercises daily with calisthetics and leg lifts.  She has a stationary bike that she uses at times.  She feels well and has no exertional chest pain or shorntess.  She has edema by the end of the day.  She takes lasix as needed which is usually once every two weeks.  Her BP has been in the 140s/80s.  Past Medical History:  Diagnosis Date  . Allergic rhinoconjunctivitis   . Allergy   . Anxiety    pt. managed- uses  deep breathing   . Arthritis    low back , stenosis  . COLONIC POLYPS, RECURRENT 08/29/2006   2008 last colonoscopy. No further colonoscopy.     [name] [name] COPD (chronic obstructive pulmonary disease) ([place])   . Diverticulosis   . Dysrhythmia    afib  . Eczema   . Environmental allergies    allergy shot- q friday in Dr. Janee Morn office. PFT's abnormal- recommended Spiriva to use preop & will d/c after surgery  . GERD (gastroesophageal reflux disease)   . Hiatal hernia   . Hyperlipidemia   . Hypertension   . Lung nodule 2011  .  Paroxysmal atrial fibrillation ([place]) 11/27/2019  . Peripheral vascular disease ([place])   . Shingles   . Stenosis of popliteal artery (HCC)    blood clots in legs [name] [name] ago    . Trigeminal neuralgia    Left buttocks    Past Surgical History:  Procedure Laterality Date  . ABDOMINAL AORTAGRAM N/A 06/17/2011   Procedure: ABDOMINAL Maxcine Ham;  Surgeon: Elam Dutch, MD;  Location: [facility] CATH LAB;  Service: Cardiovascular;  Laterality: N/A;  . ABDOMINAL AORTOGRAM W/LOWER EXTREMITY Bilateral 06/22/2018   Procedure: ABDOMINAL AORTOGRAM W/LOWER EXTREMITY;  Surgeon: Elam Dutch, MD;  Location: [facility] CV LAB;  Service: Cardiovascular;  Laterality: Bilateral;  . ABDOMINAL AORTOGRAM W/LOWER EXTREMITY Bilateral 11/29/2019   Procedure: ABDOMINAL AORTOGRAM W/LOWER EXTREMITY;  Surgeon: Elam Dutch, MD;  Location: [facility] CV LAB;  Service: Cardiovascular;  Laterality: Bilateral;  . CATARACT EXTRACTION, BILATERAL     w IOL  . CHOLECYSTECTOMY  1998  . COLONOSCOPY    . ENDARTERECTOMY FEMORAL Left 12/09/2019   Procedure: ENDARTERECTOMY FEMORAL with bovine patch angioplasty.;  Surgeon: Elam Dutch, MD;  Location: [facility] OR;  Service: Vascular;  Laterality: Left;  . FEMORAL-POPLITEAL BYPASS GRAFT        x2 surgeries 1990's & 2009  . FEMORAL-POPLITEAL BYPASS GRAFT Right 12/09/2019   Procedure: REDO RIGHT FEMORAL-POPLITEAL ARTERY BYPASS GRAFT;  Surgeon: Elam Dutch, MD;  Location: [facility];  Service: Vascular;  Laterality: Right;  . INJECTION KNEE Right Aug. 2016   Gel injection for pain  . LUMBAR FUSION  07/06/2011  . TONSILLECTOMY     as a teenager   . TUBAL LIGATION       Current Outpatient Medications  Medication Sig Dispense Refill  . acetaminophen (TYLENOL) 325 MG tablet Take 1-2 tablets (325-650 mg total) by mouth every 4 (four) hours as needed for mild pain (or temp >/= 101 F).    [name] [name] apixaban (ELIQUIS) 5 MG TABS tablet Take 1 tablet (5 mg total) by mouth 2 (two) times daily. 60  tablet 5  . atorvastatin (LIPITOR) 40 MG tablet Take 1 tablet (40 mg total) by mouth daily. 90 tablet 3  . clonazePAM (KLONOPIN) 0.25 MG disintegrating tablet Take 1 tablet (0.25 mg total) by mouth at bedtime as needed (anxiety). 20 tablet 0  . furosemide (LASIX) 20 MG tablet Take 1 tablet (20 mg total) by mouth as needed (may take if weight > 152 lbs). 30 tablet 0  . lamoTRIgine (LAMICTAL) 100 MG tablet Take 1 tablet (100 mg total) by mouth 2 (two) times daily. 60 tablet 5  . metoprolol succinate (TOPROL-XL) 25 MG 24 hr tablet Take 3 tablets by mouth once daily 90 tablet 0  . Multiple Vitamin (MULTIVITAMIN WITH MINERALS) TABS tablet Take 1 tablet by mouth daily.    . pantoprazole (PROTONIX) 40 MG tablet Take one tablet twice  daily as directed 60 tablet 11  . polycarbophil (FIBERCON) 625 MG tablet Take 1 tablet (625 mg total) by mouth daily. 30 tablet 0  . RESTASIS 0.05 % ophthalmic emulsion Place 1 drop into both eyes 2 (two) times daily as needed (dry eyes).   99   No current facility-administered medications for this visit.    Allergies:   Sulfa antibiotics, Tiotropium bromide, Latex, Ultram [tramadol], and 5-alpha reductase inhibitors   Social History:  The patient  reports that she quit smoking about 27 years ago. Her smoking use included cigarettes. She has a 60.00 pack-year smoking history. She has never used smokeless tobacco. She reports that she does not drink alcohol and does not use drugs.   Family History:  The patient's family history includes Arthritis in her mother; Brain cancer in her sister; Breast cancer in her daughter and sister; Colon cancer in her sister; Colon polyps in her father; Heart disease in her father; Hypertension in her mother and son; Lung cancer in her brother and sister.    ROS:  Please see the history of present illness.   Otherwise, review of systems are positive for none.   All other systems are reviewed and negative.    PHYSICAL EXAM: VS:  BP (!)  148/88   Pulse 71   Ht 4\' 11"  (1.499 m)   Wt 154 lb 9.6 oz (70.1 kg)   SpO2 (!) 71%   BMI 31.23 kg/m  , BMI Body mass index is 31.23 kg/m. GENERAL:  Well appearing HEENT:  Pupils equal round and reactive, fundi not visualized, oral mucosa unremarkable NECK:  No jugular venous distention, waveform within normal limits, carotid upstroke brisk and symmetric, no bruits LUNGS:  Clear to auscultation bilaterally HEART:  Irregularly irregular.  Tachycardic.  PMI not displaced or sustained,S1 and S2 within normal limits, no S3, no S4, no clicks, no rubs, no murmurs/. Distant heart sounds ABD:  Flat, positive bowel sounds normal in frequency in pitch, no bruits, no rebound, no guarding, no midline pulsatile mass, no hepatomegaly, no splenomegaly EXT:  Unable to palpate pedal or popliteal pulses bilaterally.  2+Femoral and radial pulses.  Trace edema, no cyanosis no clubbing SKIN:  No rashes no nodules NEURO:  Cranial nerves II through XII grossly intact, motor grossly intact throughout PSYCH:  Cognitively intact, oriented to person place and time  EKG:  EKG is ordered today. The ekg ordered today demonstrates atrial fibrillation.  Rate 111 bpm.   Recent Labs: 11/27/2019: Magnesium 1.7; TSH 3.190 02/19/2020: ALT 12; BUN 24; Creat 1.19; Hemoglobin 12.7; Platelets 233; Potassium 3.9; Sodium 140    Lipid Panel    Component Value Date/Time   CHOL 77 12/10/2019 0523   TRIG 74 12/10/2019 0523   TRIG 141 04/10/2006 0812   HDL 30 (L) 12/10/2019 0523   CHOLHDL 2.6 12/10/2019 0523   VLDL 15 12/10/2019 0523   LDLCALC 32 12/10/2019 0523   LDLDIRECT 46.0 06/10/2019 1132      Wt Readings from Last 3 Encounters:  06/25/20 154 lb 9.6 oz (70.1 kg)  05/22/20 152 lb 9.6 oz (69.2 kg)  05/14/20 153 lb (69.4 kg)      ASSESSMENT AND PLAN:  # Paroxysmal atrial fibrillation:  Ms. Newkirk is doing well.  Continue Eliquis and metoprolol..  # Hypertension:   Pressure poorly-controlled.  She is going to  check it at home and bring to follow-up in 1 month.  For now continue metoprolol.  # PAD:  # Hyperlipidemia: Ms. Devin  is s/p re-do R femoral-popliteal bypass 8/2021s.  Lipids are at goal.  Continue atorvastatin.  Continue Eliquis.   Current medicines are reviewed at length with the patient today.  The patient does not have concerns regarding medicines.  The following changes have been made:  none  Labs/ tests ordered today include:   No orders of the defined types were placed in this encounter.    Disposition:   FU with Tiffany C. Oval Linsey, MD, [facility] in 6 months.  PharmD in 1 month for BP control.    Signed, Tiffany C. Oval Linsey, MD, [facility]  06/25/2020 4:25 PM    [place]

## 2020-07-13 ENCOUNTER — Other Ambulatory Visit: Payer: Self-pay | Admitting: Allergy and Immunology

## 2020-07-13 NOTE — Telephone Encounter (Signed)
Please advise to refill as of last avs did not mention medication

## 2020-07-13 NOTE — Telephone Encounter (Signed)
Called patient to verify pharmacy as well as get her scheduled for an office visit. I left a message for her to call us back.

## 2020-07-13 NOTE — Telephone Encounter (Signed)
Please refill the following medication, and have her come to see me at some point as it has been sometime since her last visit.  ipratropium 0.06 % nasal spray Commonly known as: ATROVENT INHALE 2 SPRAYS INTO BOTH NOSTRILS THREE TIMES A DAY IF NEEDED

## 2020-07-14 NOTE — Telephone Encounter (Signed)
Called and spoke to patient. Refill has been sent in to patient pharmacy. Per Dr. Bruna Potter not 01/2020 he said he wanted to see her in a year and her appointment was already scheduled. ipratropium0.06 % nasal spray Has been sent in per Dr. Bruna Potter note.

## 2020-07-15 ENCOUNTER — Other Ambulatory Visit: Payer: Self-pay | Admitting: Cardiovascular Disease

## 2020-07-27 ENCOUNTER — Other Ambulatory Visit: Payer: Self-pay | Admitting: Physical Medicine & Rehabilitation

## 2020-07-27 ENCOUNTER — Other Ambulatory Visit: Payer: Self-pay

## 2020-07-27 ENCOUNTER — Ambulatory Visit (INDEPENDENT_AMBULATORY_CARE_PROVIDER_SITE_OTHER): Payer: Medicare Other | Admitting: Pharmacist Clinician (PhC)/ Clinical Pharmacy Specialist

## 2020-07-27 ENCOUNTER — Other Ambulatory Visit: Payer: Self-pay | Admitting: Neurology

## 2020-07-27 VITALS — BP 124/82 | HR 98 | Resp 15 | Ht 59.0 in | Wt 154.4 lb

## 2020-07-27 DIAGNOSIS — I10 Essential (primary) hypertension: Secondary | ICD-10-CM

## 2020-07-27 NOTE — Progress Notes (Signed)
07/27/2020 [name] [name] [birth year] [id number]   HPI:  [name] [name] is a 87 y.o. female patient of Dr Oval Linsey, with a [place] below who presents today for hypertension clinic evaluation.  She was seen by Dr. Oval Linsey last month and noted to have a blood pressure of 148/88.  At the time she was only on metoprolol succ 75 mg daily.  Dr. Oval Linsey asked that she monitor her pressure at home for a month and follow up with CVRR.  Today she is in the office with her son for follow up.   She feels well overall, but does note occasional dizziness with her lamotrigine.  She will feel a little off balance, but it doesn't occur regularly.  No issues with compliance, and no side effect problems to report.   Past Medical History: hyperlipidemia 8/21 LDL 32 on atorvastatin 40   Atrial fibrillation CHADS2-VASc 5 (age x 2, htn, PAD, female) - on metoprolol, Eliquis  PAD Right Fem-pop bypass graft, 80% re-stenosed; ABI R 0.97, L 1.29  CKD 10/21 SCr 1.19, GFR 39     Blood Pressure Goal:  130/80  Current Medications: metoprolol succ 75 mg qd  Social Hx: quit tobacco about 30+ years ago (2 ppd); no alcohol; 1/2 cup coffee (decaf) each morning  Diet: eating more at home now; admits to not eating properly - likes sandwiches, waffles w/syrup, admits to sweet tooth; does not add salt, some vegetables  Exercise: does daily exercise, balance exercises after PT; foot pedals 10+ minutes per day  Home BP readings: 32 readings over past month show average of 140/86 with range of 121-169/72-97  Intolerances: no cardiac medication intolerances  Labs: 10/21:  Na 140, K 3.9, Glu 132, BUN 24, SCr 1.19, GFR 39   Wt Readings from Last 3 Encounters:  07/27/20 154 lb 6.4 oz (70 kg)  06/25/20 154 lb 9.6 oz (70.1 kg)  05/22/20 152 lb 9.6 oz (69.2 kg)   BP Readings from Last 3 Encounters:  07/27/20 124/82  06/25/20 (!) 148/88  05/22/20 134/72   Pulse Readings from Last 3 Encounters:  07/27/20 98  06/25/20  71  05/22/20 85    Current Outpatient Medications  Medication Sig Dispense Refill  . acetaminophen (TYLENOL) 325 MG tablet Take 1-2 tablets (325-650 mg total) by mouth every 4 (four) hours as needed for mild pain (or temp >/= 101 F).    Marland Kitchen atorvastatin (LIPITOR) 40 MG tablet Take 1 tablet (40 mg total) by mouth daily. 90 tablet 3  . baclofen (LIORESAL) 10 MG tablet Take 5 mg by mouth at bedtime.    . clonazePAM (KLONOPIN) 0.25 MG disintegrating tablet Take 1 tablet (0.25 mg total) by mouth at bedtime as needed (anxiety). 20 tablet 0  . ELIQUIS 5 MG TABS tablet Take 1 tablet by mouth twice daily 180 tablet 1  . furosemide (LASIX) 20 MG tablet Take 1 tablet (20 mg total) by mouth as needed (may take if weight > 152 lbs). 30 tablet 0  . ipratropium (ATROVENT) 0.06 % nasal spray USE 2 SPRAY(S) IN EACH NOSTRIL THREE TIMES DAILY 15 mL 5  . lamoTRIgine (LAMICTAL) 100 MG tablet Take 1 tablet (100 mg total) by mouth 2 (two) times daily. 60 tablet 5  . metoprolol succinate (TOPROL-XL) 25 MG 24 hr tablet Take 3 tablets by mouth once daily 90 tablet 0  . Multiple Vitamin (MULTIVITAMIN WITH MINERALS) TABS tablet Take 1 tablet by mouth daily.    . pantoprazole (PROTONIX) 40  MG tablet Take one tablet twice daily as directed 60 tablet 11  . polycarbophil (FIBERCON) 625 MG tablet Take 1 tablet (625 mg total) by mouth daily. 30 tablet 0  . RESTASIS 0.05 % ophthalmic emulsion Place 1 drop into both eyes 2 (two) times daily as needed (dry eyes).   99   No current facility-administered medications for this visit.    Allergies  Allergen Reactions  . Sulfa Antibiotics Other (See Comments)    Cold sweat light headed and disorientation  . Tiotropium Bromide Shortness Of Breath and Other (See Comments)    Sore throat also  . Latex Itching and Rash  . Ultram [Tramadol] Nausea And Vomiting  . 5-Alpha Reductase Inhibitors     Past Medical History:  Diagnosis Date  . Allergic rhinoconjunctivitis   . Allergy    . Anxiety    pt. managed- uses deep breathing   . Arthritis    low back , stenosis  . COLONIC POLYPS, RECURRENT 08/29/2006   2008 last colonoscopy. No further colonoscopy.     Marland Kitchen COPD (chronic obstructive pulmonary disease) ([place])   . Diverticulosis   . Dysrhythmia    afib  . Eczema   . Environmental allergies    allergy shot- q friday in Dr. Janee Morn office. PFT's abnormal- recommended Spiriva to use preop & will d/c after surgery  . GERD (gastroesophageal reflux disease)   . Hiatal hernia   . Hyperlipidemia   . Hypertension   . Lung nodule 2011  . Paroxysmal atrial fibrillation ([place]) 11/27/2019  . Peripheral vascular disease ([place])   . Shingles   . Stenosis of popliteal artery (HCC)    blood clots in legs long ago    . Trigeminal neuralgia    Left buttocks    Blood pressure 124/82, pulse 98, resp. rate 15, height 4\' 11"  (1.499 m), weight 154 lb 6.4 oz (70 kg), SpO2 95 %.  Essential hypertension Patient with essential hypertension, but also very advanced age.  Discussed options for lowering pressure, stressed that we did not want to lower too much, as she would be a fall risk.  She is concerned about her pressure and worries when it is elevated.  Will start her on lisinopril 5 mg once daily.  She will repeat blood work (metabolic panel) later this month when she sees her PCP.  Suggested that PCP could then continue to monitor her BP, as he sees her more frequently thru the year.  Patient and son are agreeable with this plan.  Assured her that if the PCP thought it was necessary, we would be happy to continue with this monitoring.     Tommy Medal PharmD CPP [facility] [address] [place], [place] [address] [phone]

## 2020-07-27 NOTE — Patient Instructions (Addendum)
  Check your blood pressure at home daily several times each week and keep record of the readings.  CALL ME WITH ANY BLOOD PRESSURE MEDICATIONS YOU MAY HAVE AT HOME.   [name] [name] AT [phone]  Take your BP meds as follows:  (if you don't have any medication) we will start lisinopril 5 mg once daily.    You will need to repeat kidney function labs about 2-3 weeks if we do this   Bring all of your meds, your BP cuff and your record of home blood pressures to your next appointment.  Exercise as you're able, try to walk approximately 30 minutes per day.  Keep salt intake to a minimum, especially watch canned and prepared boxed foods.  Eat more fresh fruits and vegetables and fewer canned items.  Avoid eating in fast food restaurants.    HOW TO TAKE YOUR BLOOD PRESSURE: . Rest 5 minutes before taking your blood pressure. .  Don't smoke or drink caffeinated beverages for at least 30 minutes before. . Take your blood pressure before (not after) you eat. . Sit comfortably with your back supported and both feet on the floor (don't cross your legs). . Elevate your arm to heart level on a table or a desk. . Use the proper sized cuff. It should fit smoothly and snugly around your bare upper arm. There should be enough room to slip a fingertip under the cuff. The bottom edge of the cuff should be 1 inch above the crease of the elbow. . Ideally, take 3 measurements at one sitting and record the average.

## 2020-07-27 NOTE — Assessment & Plan Note (Signed)
Patient with essential hypertension, but also very advanced age.  Discussed options for lowering pressure, stressed that we did not want to lower too much, as she would be a fall risk.  She is concerned about her pressure and worries when it is elevated.  Will start her on lisinopril 5 mg once daily.  She will repeat blood work (metabolic panel) later this month when she sees her PCP.  Suggested that PCP could then continue to monitor her BP, as he sees her more frequently thru the year.  Patient and son are agreeable with this plan.  Assured her that if the PCP thought it was necessary, we would be happy to continue with this monitoring.

## 2020-08-04 ENCOUNTER — Telehealth: Payer: Self-pay

## 2020-08-04 NOTE — Telephone Encounter (Signed)
Pt's daughter returning kristin alvstad's call requesting more record. Will route to kristin pharmd

## 2020-08-04 NOTE — Telephone Encounter (Signed)
Pt.'s daughter is needing to know if pt has taken any bp medications and if so which ones

## 2020-08-04 NOTE — Telephone Encounter (Signed)
Called and spoke with patients daughter, gave her bp medications she requested from patients records. Gave verbal understanding.

## 2020-08-05 DIAGNOSIS — Z23 Encounter for immunization: Secondary | ICD-10-CM | POA: Diagnosis not present

## 2020-08-07 ENCOUNTER — Other Ambulatory Visit: Payer: Self-pay | Admitting: Pharmacist Clinician (PhC)/ Clinical Pharmacy Specialist

## 2020-08-07 MED ORDER — LISINOPRIL 5 MG PO TABS: 5.0000 mg | ORAL_TABLET | Freq: Every day | ORAL | 3 refills | Status: DC

## 2020-08-07 NOTE — Telephone Encounter (Signed)
Spoke with daughter about patient med history.  She took irbesartan/hctz for many years with good results.  D/C'd after having PAD procedure last year.  Daughter also concerned about patient feeling "woozy" and dizzy at times.  Reviewed meds with daughter, she believes it comes from Lamictal.  I agree that this is possible, could be worsened on days that she also uses baclofen as well.  Daughter will watch for this.

## 2020-08-07 NOTE — Telephone Encounter (Signed)
Spoke with daughter - she wanted to share mother's medication history and had questions about current plan.  Pt was on irbesartan hctz 150/12.5 mg bid from 2015-2021 and was discontinued at time of fem-pop procedure.    Currently home pressure is higher in the mornings (150/90's) but down to 120-80's later in the day.  Daughter admits that they cannot control her sodium intake completely and that she may be eating more sodium in the evenings.  She also continues to have days with "wooziness" and lightheadedness.   Reviewed meds, could be combination of lamictal and baclofen, she rarely uses clonazepam.    Will take lisinopril in the evenings, as her morning pressure is higher, and if still elevated after seeing her PCP at the end of this month, we can follow up with her.    Daughter voiced understanding and thanked for our time (22 minutes)

## 2020-08-15 ENCOUNTER — Other Ambulatory Visit: Payer: Self-pay | Admitting: Cardiovascular Disease

## 2020-08-18 ENCOUNTER — Encounter: Payer: Self-pay | Admitting: Physical Medicine & Rehabilitation

## 2020-08-18 ENCOUNTER — Encounter: Payer: Medicare Other | Attending: Physical Medicine & Rehabilitation | Admitting: Physical Medicine & Rehabilitation

## 2020-08-18 ENCOUNTER — Other Ambulatory Visit: Payer: Self-pay

## 2020-08-18 VITALS — BP 136/71 | HR 79 | Ht 59.0 in | Wt 157.8 lb

## 2020-08-18 DIAGNOSIS — M961 Postlaminectomy syndrome, not elsewhere classified: Secondary | ICD-10-CM | POA: Diagnosis not present

## 2020-08-18 DIAGNOSIS — G479 Sleep disorder, unspecified: Secondary | ICD-10-CM | POA: Insufficient documentation

## 2020-08-18 DIAGNOSIS — G894 Chronic pain syndrome: Secondary | ICD-10-CM | POA: Insufficient documentation

## 2020-08-18 DIAGNOSIS — M791 Myalgia, unspecified site: Secondary | ICD-10-CM | POA: Insufficient documentation

## 2020-08-18 DIAGNOSIS — R269 Unspecified abnormalities of gait and mobility: Secondary | ICD-10-CM | POA: Diagnosis not present

## 2020-08-18 DIAGNOSIS — M792 Neuralgia and neuritis, unspecified: Secondary | ICD-10-CM | POA: Diagnosis not present

## 2020-08-18 MED ORDER — BACLOFEN 10 MG PO TABS: ORAL_TABLET | ORAL | 0 refills | Status: DC

## 2020-08-18 NOTE — Progress Notes (Addendum)
Subjective:    Patient ID: [name] [name], female    DOB: December 01, 1924, 87 y.o.   MRN: [id number]  HPI Female with pmh/psh of PAD, COPD, DVT, HTN, Gout, trigeminal neuralgia, post herpetic neuralgia, OA of right knee with injections, lumbar fusion present for follow up of pain in her back > leg pain.   Initially stated: Pt stated she has had back pain "all her life".  Getting progressively worse.  Movement improves the pain.  Standing and walking exacerbate the pain. Dull pain.  Non-radiating.  Intermittent.  Denies associated numbness, tingling, weakness.  Lidoderm patch help. Pain limits pt from doing things around the house.   Last clinic visit on 02/20/2020.  Since that time, pt states her back is hurting. She is doing HEP. She notes drowsiness with Baclofen. Denies falls.  Pain Inventory Average Pain 7 Pain Right Now 5 My pain is constant, sharp and stabbing  In the last 24 hours, has pain interfered with the following? General activity 7 Relation with others 8 Enjoyment of life 8 What TIME of day is your pain at its worst? morning  and daytime Sleep (in general) Fair  Pain is worse with: walking, standing and some activites Pain improves with: rest, therapy/exercise and injections Relief from Meds: Can not tell the difference  Family History  Problem Relation Age of Onset  . Arthritis Mother   . Hypertension Mother   . Heart disease Father   . Colon polyps Father   . Breast cancer Sister   . Breast cancer Daughter   . Hypertension Son   . Lung cancer Brother   . Colon cancer Sister   . Brain cancer Sister   . Lung cancer Sister   . Anesthesia problems Neg Hx   . Hypotension Neg Hx   . Malignant hyperthermia Neg Hx   . Pseudochol deficiency Neg Hx    Social History   Socioeconomic History  . Marital status: Single    Spouse name: Not on file  . Number of children: 4  . Years of education: 33  . Highest education level: Not on file  Occupational History  .  Occupation: retired    Fish farm manager: RETIRED  Tobacco Use  . Smoking status: Former Smoker    Packs/day: 2.00    Years: 30.00    Pack years: 60.00    Types: Cigarettes    Quit date: 06/18/1993    Years since quitting: 27.1  . Smokeless tobacco: Never Used  . Tobacco comment: QUIT IN 1995  Vaping Use  . Vaping Use: Never used  Substance and Sexual Activity  . Alcohol use: No    Alcohol/week: 0.0 standard drinks  . Drug use: No  . Sexual activity: Not on file  Other Topics Concern  . Not on file  Social History Narrative   Patient is widowed with 4 children. 2 grandkids. Lives alone.    Patient is right handed.   Patient has high school education.   Patient drinks 1 cup daily.      Retired from [employer] for 28 years, works 2 days a week until 2016.       Hobbies: volunteers at [employer], [place] from church visits people.       No caffeine.    Social Determinants of Health   Financial Resource Strain: Low Risk   . Difficulty of Paying Living Expenses: Not hard at all  Food Insecurity: No Food Insecurity  . Worried About [employer] of  Food in the Last Year: Never true  . Ran Out of Food in the Last Year: Never true  Transportation Needs: No Transportation Needs  . Lack of Transportation (Medical): No  . Lack of Transportation (Non-Medical): No  Physical Activity: Sufficiently Active  . Days of Exercise per Week: 7 days  . Minutes of Exercise per Session: 30 min  Stress: No Stress Concern Present  . Feeling of Stress : Not at all  Social Connections: Socially Isolated  . Frequency of Communication with Friends and Family: More than three times a week  . Frequency of Social Gatherings with Friends and Family: Never  . Attends Religious Services: Never  . Active Member of Clubs or Organizations: No  . Attends Archivist Meetings: Never  . Marital Status: Widowed   Past Surgical History:  Procedure Laterality Date  . ABDOMINAL AORTAGRAM N/A  06/17/2011   Procedure: ABDOMINAL Maxcine Ham;  Surgeon: Elam Dutch, MD;  Location: [facility] CATH LAB;  Service: Cardiovascular;  Laterality: N/A;  . ABDOMINAL AORTOGRAM W/LOWER EXTREMITY Bilateral 06/22/2018   Procedure: ABDOMINAL AORTOGRAM W/LOWER EXTREMITY;  Surgeon: Elam Dutch, MD;  Location: [place] CV LAB;  Service: Cardiovascular;  Laterality: Bilateral;  . ABDOMINAL AORTOGRAM W/LOWER EXTREMITY Bilateral 11/29/2019   Procedure: ABDOMINAL AORTOGRAM W/LOWER EXTREMITY;  Surgeon: Elam Dutch, MD;  Location: [place] CV LAB;  Service: Cardiovascular;  Laterality: Bilateral;  . CATARACT EXTRACTION, BILATERAL     w IOL  . CHOLECYSTECTOMY  1998  . COLONOSCOPY    . ENDARTERECTOMY FEMORAL Left 12/09/2019   Procedure: ENDARTERECTOMY FEMORAL with bovine patch angioplasty.;  Surgeon: Elam Dutch, MD;  Location: [facility] OR;  Service: Vascular;  Laterality: Left;  . FEMORAL-POPLITEAL BYPASS GRAFT        x2 surgeries 1990's & 2009  . FEMORAL-POPLITEAL BYPASS GRAFT Right 12/09/2019   Procedure: REDO RIGHT FEMORAL-POPLITEAL ARTERY BYPASS GRAFT;  Surgeon: Elam Dutch, MD;  Location: [place];  Service: Vascular;  Laterality: Right;  . INJECTION KNEE Right Aug. 2016   Gel injection for pain  . LUMBAR FUSION  07/06/2011  . TONSILLECTOMY     as a teenager   . TUBAL LIGATION     Past Surgical History:  Procedure Laterality Date  . ABDOMINAL AORTAGRAM N/A 06/17/2011   Procedure: ABDOMINAL Maxcine Ham;  Surgeon: Elam Dutch, MD;  Location: [facility] CATH LAB;  Service: Cardiovascular;  Laterality: N/A;  . ABDOMINAL AORTOGRAM W/LOWER EXTREMITY Bilateral 06/22/2018   Procedure: ABDOMINAL AORTOGRAM W/LOWER EXTREMITY;  Surgeon: Elam Dutch, MD;  Location: [place] CV LAB;  Service: Cardiovascular;  Laterality: Bilateral;  . ABDOMINAL AORTOGRAM W/LOWER EXTREMITY Bilateral 11/29/2019   Procedure: ABDOMINAL AORTOGRAM W/LOWER EXTREMITY;  Surgeon: Elam Dutch, MD;  Location: [place] CV  LAB;  Service: Cardiovascular;  Laterality: Bilateral;  . CATARACT EXTRACTION, BILATERAL     w IOL  . CHOLECYSTECTOMY  1998  . COLONOSCOPY    . ENDARTERECTOMY FEMORAL Left 12/09/2019   Procedure: ENDARTERECTOMY FEMORAL with bovine patch angioplasty.;  Surgeon: Elam Dutch, MD;  Location: [facility] OR;  Service: Vascular;  Laterality: Left;  . FEMORAL-POPLITEAL BYPASS GRAFT        x2 surgeries 1990's & 2009  . FEMORAL-POPLITEAL BYPASS GRAFT Right 12/09/2019   Procedure: REDO RIGHT FEMORAL-POPLITEAL ARTERY BYPASS GRAFT;  Surgeon: Elam Dutch, MD;  Location: [place];  Service: Vascular;  Laterality: Right;  . INJECTION KNEE Right Aug. 2016   Gel injection for pain  . LUMBAR FUSION  07/06/2011  . TONSILLECTOMY     as a teenager   . TUBAL LIGATION     Past Medical History:  Diagnosis Date  . Allergic rhinoconjunctivitis   . Allergy   . Anxiety    pt. managed- uses deep breathing   . Arthritis    low back , stenosis  . COLONIC POLYPS, RECURRENT 08/29/2006   2008 last colonoscopy. No further colonoscopy.     Marland Kitchen COPD (chronic obstructive pulmonary disease) ([place])   . Diverticulosis   . Dysrhythmia    afib  . Eczema   . Environmental allergies    allergy shot- q friday in Dr. Janee Morn office. PFT's abnormal- recommended Spiriva to use preop & will d/c after surgery  . GERD (gastroesophageal reflux disease)   . Hiatal hernia   . Hyperlipidemia   . Hypertension   . Lung nodule 2011  . Paroxysmal atrial fibrillation ([place]) 11/27/2019  . Peripheral vascular disease ([place])   . Shingles   . Stenosis of popliteal artery (HCC)    blood clots in legs long ago    . Trigeminal neuralgia    Left buttocks   BP 136/71   Pulse 79   Ht 4\' 11"  (1.499 m)   Wt 157 lb 12.8 oz (71.6 kg)   SpO2 98%   BMI 31.87 kg/m   Opioid Risk Score:   Fall Risk Score:  `1  Depression screen PHQ 2/9  Depression screen [facility] 2/9 08/18/2020 05/22/2020 02/20/2020 01/16/2020 12/05/2018 08/08/2018 05/17/2018  Decreased  Interest 0 0 1 0 0 0 0  Down, Depressed, Hopeless 0 0 1 0 0 0 0  PHQ - 2 Score 0 0 2 0 0 0 0  Altered sleeping - - - - - - -  Tired, decreased energy - - - - - - -  Change in appetite - - - - - - -  Feeling bad or failure about yourself  - - - - - - -  Trouble concentrating - - - - - - -  Moving slowly or fidgety/restless - - - - - - -  Suicidal thoughts - - - - - - -  PHQ-9 Score - - - - - - -  Difficult doing work/chores - - - - - - -  Some recent data might be hidden   Review of Systems  Musculoskeletal: Positive for back pain and gait problem.  All other systems reviewed and are negative.     Objective:   Physical Exam  Constitutional: No distress . Vital signs reviewed. HENT: Normocephalic.  Atraumatic. Eyes: EOMI. No discharge. Cardiovascular: No JVD.   Respiratory: Normal effort.  No stridor.   GI: Non-distended.   Skin: Warm and dry.  Intact. Psych: Normal mood.  Normal behavior. Musc: Edema in LE. No tenderness in extremities. Gait: Mildly antalgic Mild TTP lumbosacral PSPs Neuro: Alert Strength5/5 grossly throughout    Assessment & Plan:  Female with pmhpsh of PAD, COPD, DVT, HTN, Gout, trigeminal neuralgia, post herpetic neuralgia, OA of right knee with injections, lumbar fusion present for follow up of pain in her back > leg pain.    1. Chronic low back pain with failed back syndrome              Xray of L-spine and right SI joint reviewed, SI joint unremarkable, degenerative changes in spine with right scoliosis.               Will avoid NSAIDs due to coumadin  Stopped using Robaxin due to nausea             Unable to tolerate Gabapentin             Cont heat             Cont tylenol, limit to 2000mg /day             Continue Lidoderm patches  Will refer to PT now that patient has transportation             Resume TENS unit PRN             Continue Cymbalta to 60 mg  Baclofen changed to 5 qhs  2. Possible Sacroiliitis  bilaterally             Xray unremakrable             Minimal benefit with bracing              Discussed b/l SI joint injections  3. Sleep disturbance             Continue melatonin             Encouraged sleep hygiene  4. Neuropathic pain             See #1  5. Gait abnormality             Improved             Cont rolling walker, attempting to transition to cane             Cont HEP             Information given on core strengthening exercises, patient more compliant             Will refer for PT  6. Myalgia             Good benefit with trigger point injections, will schedule again with Dr. Adam Phenix  7. Right knee pain             MRI reviewed from 2016, showing meniscal/cartilage damage             Receiving injections from Ortho, cont             [place] denied             Cont voltaren gel             Cont lidoderm on knee             Improved  8. Right ankle pain - likely gout flare             Lidoderm patches, pt using Voltaren gel             Xrays reviewed, relatively uremarkable             Uric acid elevated             Improved   9. Rectal bleeding             Resolved  10. Trigeminal neuralgia.              Improved per patient, recommend follow up with Neuro regarding medication d/c questions             Continue Lamictal 100 mg twice daily   11. PVD s/p redo right femoral to below-knee popliteal bypass as well as left common femoral enterectomy 12/09/2019.   Cont to follow up with Vascular

## 2020-08-18 NOTE — Addendum Note (Signed)
Addended by: Delice Lesch A on: 08/18/2020 11:52 AM   Modules accepted: Orders

## 2020-08-19 NOTE — Progress Notes (Signed)
Phone [phone] In person visit   Subjective:   [name] [name] is a 87 y.o. year old very pleasant female patient who presents for/with See problem oriented charting Chief Complaint  Patient presents with  . Hypertension  . Hyperlipidemia  . Chronic Kidney Disease    Stage 3   . Obesity    This visit occurred during the SARS-CoV-2 public health emergency.  Safety protocols were in place, including screening questions prior to the visit, additional usage of staff PPE, and extensive cleaning of exam room while observing appropriate contact time as indicated for disinfecting solutions.   Past Medical History-  Patient Active Problem List   Diagnosis Date Noted  . Paroxysmal atrial fibrillation ([place]) 11/27/2019    Priority: High  . Pain in right ankle and joints of right foot 08/28/2014    Priority: High  . Peripheral arterial disease ([place]) 06/29/2007    Priority: High  . Left groin hernia 05/22/2020    Priority: Medium  . CKD (chronic kidney disease), stage III ([place]) 08/07/2017    Priority: Medium  . Acute gout of left foot 03/22/2016    Priority: Medium  . Trigeminal neuralgia     Priority: Medium  . Chronic low back pain 09/13/2010    Priority: Medium  . COPD mixed type ([place]) 01/22/2010    Priority: Medium  . Insulin resistance 01/06/2009    Priority: Medium  . Hyperlipidemia 10/23/2006    Priority: Medium  . Essential hypertension 10/23/2006    Priority: Medium  . ESOPHAGEAL STRICTURE 08/09/1993    Priority: Medium  . Secondary hypercoagulable state ([place]) 12/03/2019    Priority: Low  . Obesity (BMI 30.0-34.9) 11/14/2017    Priority: Low  . Unilateral primary osteoarthritis, right knee 04/07/2016    Priority: Low  . Encounter for therapeutic drug monitoring 10/21/2013    Priority: Low  . Syncope 12/29/2011    Priority: Low  . Allergic rhinitis due to pollen 06/25/2010    Priority: Low  . Solitary pulmonary nodule 01/12/2010    Priority: Low  .  Osteopenia 01/06/2009    Priority: Low  . Eczema 11/26/2007    Priority: Low  . Primary osteoarthritis of right knee 06/02/2020  . Sleep disturbance 01/09/2020  . Acute lower UTI   . Anxiety state   . Peripheral edema   . Leukocytosis   . Debility 12/13/2019  . Dyslipidemia   . Rectal bleeding   . Acute blood loss anemia   . Ischemic colitis ([place])   . Status post femoral-popliteal bypass surgery 12/09/2019  . Chronic bilateral low back pain with right-sided sciatica 08/29/2019  . Chronic pain of right knee 08/29/2019  . Abnormality of gait 05/30/2019  . Neuropathic pain 05/30/2019  . Myalgia 08/08/2018  . Chronic pain syndrome 06/27/2018  . Gross hematuria 06/01/2018  . Failed back syndrome 09/28/2017    Medications- reviewed and updated Current Outpatient Medications  Medication Sig Dispense Refill  . acetaminophen (TYLENOL) 325 MG tablet Take 1-2 tablets (325-650 mg total) by mouth every 4 (four) hours as needed for mild pain (or temp >/= 101 F).    Marland Kitchen atorvastatin (LIPITOR) 40 MG tablet Take 1 tablet (40 mg total) by mouth daily. 90 tablet 3  . baclofen (LIORESAL) 10 MG tablet TAKE 1/2 (ONE-HALF) TABLET BY MOUTH ONCE DAILY NIGHTLY 30 tablet 0  . clonazePAM (KLONOPIN) 0.25 MG disintegrating tablet Take 1 tablet (0.25 mg total) by mouth at bedtime as needed (anxiety). 20 tablet 0  . ELIQUIS  5 MG TABS tablet Take 1 tablet by mouth twice daily 180 tablet 1  . furosemide (LASIX) 20 MG tablet Take 1 tablet (20 mg total) by mouth as needed (may take if weight > 152 lbs). 30 tablet 0  . ipratropium (ATROVENT) 0.06 % nasal spray USE 2 SPRAY(S) IN EACH NOSTRIL THREE TIMES DAILY 15 mL 5  . lamoTRIgine (LAMICTAL) 100 MG tablet Take 1 tablet by mouth twice daily 60 tablet 0  . lisinopril (ZESTRIL) 5 MG tablet Take 1 tablet (5 mg total) by mouth daily. 30 tablet 3  . metoprolol succinate (TOPROL-XL) 25 MG 24 hr tablet Take 3 tablets by mouth once daily 90 tablet 0  . Multiple Vitamin  (MULTIVITAMIN WITH MINERALS) TABS tablet Take 1 tablet by mouth daily.    . pantoprazole (PROTONIX) 40 MG tablet Take one tablet twice daily as directed 60 tablet 11  . polycarbophil (FIBERCON) 625 MG tablet Take 1 tablet (625 mg total) by mouth daily. 30 tablet 0  . RESTASIS 0.05 % ophthalmic emulsion Place 1 drop into both eyes 2 (two) times daily as needed (dry eyes).   99   No current facility-administered medications for this visit.     Objective:  BP 130/72   Pulse 62   Temp 99.2 F (37.3 C) (Temporal)   Ht 4\' 11"  (1.499 m)   Wt 159 lb (72.1 kg)   SpO2 95%   BMI 32.11 kg/m  Gen: NAD, resting comfortably, appears younger than stated age CV: RRR no murmurs rubs or gallops Lungs: CTAB no crackles, wheeze, rhonchi Abdomen: soft/nontender/nondistended/normal bowel sounds.  Ext: trace to 1+ edema Skin: warm, dry    Assessment and Plan   #left groin hernia- seen in January for this- was having some slight pain with weight lifting- we had discussed could continue unless worsening pain. Emergent precautions given- she prefers to avoid surgery. She states no recent issues with that  #PAD-actually on warfarin for arterial thrombosis in the past now on Eliquis for A. fib as well. Dr. Oneida Alar ok with compression stockings #Hyperlipidemia  S: Compliant with simvastatin at 40 mg.  LDL goal 70 or less due to arterial disease. A/P: PAD has been stable- no claudication. Continue simvastatin and eliquis (doing much better after operation) - too soon for lipid panel  # Atrial fibrillation S: Rate controlled with metoprolol 75 mg extended release Anticoagulated with Eliquis 5 mg twice daily A/P: Stable. Continue current medications.   - with a fib history and with newly reported daytimes sleepiness and likely snoring- refer to pulmonology for their opinion/potential sleep study   #Essential hypertension S: medication: Lisinopril 5 mg, metoprolol 75 mg XR and lasix 20 mg daily prn (took one  this morning as swelling up and weight up) prior compliant with irbesartan 30 mg and hydrochlorothiazide 25 mg but stopped after august 2021 hospitalization.  BP Readings from Last 3 Encounters:  08/20/20 130/72  08/18/20 136/71  07/27/20 124/82  A/P: Stable. Continue current medications. Check cr with recent ace-I start    #Chronic kidney disease stage III S: Patient's GFR has been stable in the 40s for most part.  She knows to avoid NSAIDs A/P: GFr slightly below 40 last check- continue to trend today . Did recently start ace-I so may have worsened  #Insulin resistance/obesity/hyperglycemia S: #Improved last visit. -had a lot of chocolate covered cherries (her favorite candy) last night - up 7 lbs from January and advised to be careful- but has had to take lasix-  may have some fluid retention Lab Results  Component Value Date   HGBA1C 5.6 02/19/2020   HGBA1C 6.2 06/10/2019   HGBA1C 6.3 12/05/2018   A/P:Hopefully stable or at least not significantly worsened-update with labs today  Other notes: 1.  Eye watering better on restasis 2. Dr. Neldon Mc started her on protonix 40mg - also can use pepcid but hasnt had to 3. for back pain- getting shots in her back that are working better than cymbalta was. Also on baclofen . Dr. Posey Pronto is moving to a new practice so she will be transitioning 4. On lamictal for trigeminal neuralgia 5. Walking with a cane, driving on her own again  #COPD listed- largely asymptomatic- continue to monitor without meds  With long term PPI use check b12 Lab Results  Component Value Date   VITAMINB12 339 04/10/2006   Recommended follow up: Return in about 6 months (around 02/19/2021) for follow up- or sooner if needed. Future Appointments  Date Time Provider [place]  08/28/2020 10:10 AM Pieter Partridge, DO [facility] None  09/04/2020  9:20 AM Raulkar, Clide Deutscher, MD [facility] CPR  12/30/2020  9:20 AM Skeet Latch, MD [facility]  01/21/2021 11:00 AM  [facility] HEALTH COACH [facility] PEC  02/23/2021 11:30 AM Kozlow, Donnamarie Poag, MD [facility] None    Lab/Order associations:   ICD-10-CM   1. High risk medication use  Z79.899 Vitamin B12  2. Peripheral arterial disease (HCC)  I73.9   3. Hyperlipidemia, unspecified hyperlipidemia type  E78.5   4. Essential hypertension  I10 CBC with Differential/Platelet    Comprehensive metabolic panel  5. COPD mixed type (HCC) Chronic J44.9   6. Persistent atrial fibrillation (HCC) Chronic I48.19   7. Secondary hypercoagulable state ([place])- secondary to a fib- on eliquis for treatment and stable D68.69   8. Snoring  R06.83 Ambulatory referral to Pulmonology  9. Insulin resistance  E88.81 Hemoglobin A1c  10. Excessive daytime sleepiness  G47.19 Ambulatory referral to Pulmonology  11. Hyperglycemia  R73.9 Hemoglobin A1c   Return precautions advised.  [name], MD

## 2020-08-19 NOTE — Patient Instructions (Addendum)
Please stop by lab before you go If you have mychart- we will send your results within 3 business days of [place] receiving them.  If you do not have mychart- we will call you about results within 5 business days of [place] receiving them.  *please also note that you will see labs on mychart as soon as they post. I will later go in and write notes on them- will say "notes from Dr. Yong Channel"  No changes today unless labs lead [place] to make changes  Recommended follow up: Return in about 6 months (around 02/19/2021) for follow up- or sooner if needed.

## 2020-08-20 ENCOUNTER — Ambulatory Visit (INDEPENDENT_AMBULATORY_CARE_PROVIDER_SITE_OTHER): Payer: Medicare Other | Admitting: Family Medicine

## 2020-08-20 ENCOUNTER — Other Ambulatory Visit: Payer: Self-pay

## 2020-08-20 ENCOUNTER — Encounter: Payer: Self-pay | Admitting: Family Medicine

## 2020-08-20 VITALS — BP 130/72 | HR 62 | Temp 99.2°F | Ht 59.0 in | Wt 159.0 lb

## 2020-08-20 DIAGNOSIS — E8881 Metabolic syndrome: Secondary | ICD-10-CM | POA: Diagnosis not present

## 2020-08-20 DIAGNOSIS — I10 Essential (primary) hypertension: Secondary | ICD-10-CM | POA: Diagnosis not present

## 2020-08-20 DIAGNOSIS — D6869 Other thrombophilia: Secondary | ICD-10-CM | POA: Diagnosis not present

## 2020-08-20 DIAGNOSIS — R739 Hyperglycemia, unspecified: Secondary | ICD-10-CM

## 2020-08-20 DIAGNOSIS — I4819 Other persistent atrial fibrillation: Secondary | ICD-10-CM

## 2020-08-20 DIAGNOSIS — R0683 Snoring: Secondary | ICD-10-CM

## 2020-08-20 DIAGNOSIS — Z79899 Other long term (current) drug therapy: Secondary | ICD-10-CM | POA: Diagnosis not present

## 2020-08-20 DIAGNOSIS — I739 Peripheral vascular disease, unspecified: Secondary | ICD-10-CM

## 2020-08-20 DIAGNOSIS — J449 Chronic obstructive pulmonary disease, unspecified: Secondary | ICD-10-CM | POA: Diagnosis not present

## 2020-08-20 DIAGNOSIS — G4719 Other hypersomnia: Secondary | ICD-10-CM

## 2020-08-20 DIAGNOSIS — E785 Hyperlipidemia, unspecified: Secondary | ICD-10-CM | POA: Diagnosis not present

## 2020-08-20 LAB — COMPREHENSIVE METABOLIC PANEL
ALT: 14 U/L (ref 0–35)
AST: 18 U/L (ref 0–37)
Albumin: 4.4 g/dL (ref 3.5–5.2)
Alkaline Phosphatase: 103 U/L (ref 39–117)
BUN: 25 mg/dL — ABNORMAL HIGH (ref 6–23)
CO2: 33 mEq/L — ABNORMAL HIGH (ref 19–32)
Calcium: 9.7 mg/dL (ref 8.4–10.5)
Chloride: 103 mEq/L (ref 96–112)
Creatinine, Ser: 1.16 mg/dL (ref 0.40–1.20)
GFR: 40.56 mL/min — ABNORMAL LOW (ref >60.00)
Glucose, Bld: 104 mg/dL — ABNORMAL HIGH (ref 70–99)
Potassium: 4.3 mEq/L (ref 3.5–5.1)
Sodium: 142 mEq/L (ref 135–145)
Total Bilirubin: 0.5 mg/dL (ref 0.2–1.2)
Total Protein: 6.6 g/dL (ref 6.0–8.3)

## 2020-08-20 LAB — CBC WITH DIFFERENTIAL/PLATELET
Basophils Absolute: 0.1 10*3/uL (ref 0.0–0.1)
Basophils Relative: 0.7 % (ref 0.0–3.0)
Eosinophils Absolute: 0.2 10*3/uL (ref 0.0–0.7)
Eosinophils Relative: 2.5 % (ref 0.0–5.0)
HCT: 39.1 % (ref 36.0–46.0)
Hemoglobin: 13.2 g/dL (ref 12.0–15.0)
Lymphocytes Relative: 25.2 % (ref 12.0–46.0)
Lymphs Abs: 2.1 10*3/uL (ref 0.7–4.0)
MCHC: 33.8 g/dL (ref 30.0–36.0)
MCV: 83.2 fl (ref 78.0–100.0)
Monocytes Absolute: 0.5 10*3/uL (ref 0.1–1.0)
Monocytes Relative: 6.4 % (ref 3.0–12.0)
Neutro Abs: 5.3 10*3/uL (ref 1.4–7.7)
Neutrophils Relative %: 65.2 % (ref 43.0–77.0)
Platelets: 159 10*3/uL (ref 150.0–400.0)
RBC: 4.7 Mil/uL (ref 3.87–5.11)
RDW: 15.2 % (ref 11.5–15.5)
WBC: 8.2 10*3/uL (ref 4.0–10.5)

## 2020-08-20 LAB — HEMOGLOBIN A1C: Hgb A1c MFr Bld: 6.1 % (ref 4.6–6.5)

## 2020-08-20 LAB — VITAMIN B12: Vitamin B-12: 444 pg/mL (ref 211–911)

## 2020-08-24 ENCOUNTER — Other Ambulatory Visit: Payer: Self-pay | Admitting: *Deleted

## 2020-08-24 MED ORDER — METOPROLOL SUCCINATE ER 25 MG PO TB24: 3.0000 | ORAL_TABLET | Freq: Every day | ORAL | 1 refills | Status: DC

## 2020-08-26 NOTE — Progress Notes (Signed)
NEUROLOGY FOLLOW UP OFFICE NOTE  [name] [name] [id number]  Assessment/Plan:   1.  Left sided trigeminal neuralgia.  As patient has been symptom-free but also is concerned about possible adverse reaction to lamotrigine, we will taper off of lamotrigine.  1.  Taper off of lamotrigine to 50mg  BID for a week, then 50mg  daily for a week, then STOP 2.  If pain returns, contact me 3.  Follow up 6 months.  Subjective:  [name] [name] is a 87 year old female with COPD, CKD, PAD and history of DVTs and PAF on chronic Coumadin whofollows up for left-sided trigeminal neuralgia.  She is accompanied by her daughter who supplements history. Marland Kitchen  UPDATE: Current medication:Lamotrigine100mg  twice daily; baclofen 5mg  QHS  No recurrence of trigeminal neuralgia since last visit except just on split second flash of discomfort. She would like to try getting off of the lamotrigine.  She thinks it may affect her gait - sometimes she may stagger briefly and may cause slurred speech.  She also feels sleepy when she sits down.    08/20/2020 LABS:  CBC with WBC 8.2, HGB 13.2, HCT 39.1, PLT 159; CMP with Na 142, K 4.3, Cl 103, CO2 33, glucose 104, BUN 25, Cr 1.16, GFR 40.56, t bili 0.5, ALP 103, AST 18, ALT 14.  HISTORY: She was diagnosed with right-sided trigeminal neuralgiain 2016. She was treated at that time. She was treated with carbamazepine which was stopped due to interference with Coumadin and later gabapentin. It subsequently resolved.  In 2020, she began having left sided facial pain. This is worse than the previous episode. It mostly involves the cheek. It is sometimes a paroxysmal stabbing pain or a constant sharp electric pain. It is daily. It gets worse later in the day. Movement aggravates it. She cannot sleep, talk on the phone or eat. Staying still helps ease it. No numbness or tingling. No facial weakness. No associated rash. No change in hearing or vision.   Past  medications: Gabapentin (unable to tolerate), carbamazepine (interfered with Coumadin),oxcarbazepine (drowsiness);Cymbalta (not used for this), tramadol(caused nausea); baclofen (for back pain)  PAST MEDICAL HISTORY: Past Medical History:  Diagnosis Date  . Allergic rhinoconjunctivitis   . Allergy   . Anxiety    pt. managed- uses deep breathing   . Arthritis    low back , stenosis  . COLONIC POLYPS, RECURRENT 08/29/2006   2008 last colonoscopy. No further colonoscopy.     Marland Kitchen COPD (chronic obstructive pulmonary disease) ([place])   . Diverticulosis   . Dysrhythmia    afib  . Eczema   . Environmental allergies    allergy shot- q friday in Dr. Janee Morn office. PFT's abnormal- recommended Spiriva to use preop & will d/c after surgery  . GERD (gastroesophageal reflux disease)   . Hiatal hernia   . Hyperlipidemia   . Hypertension   . Lung nodule 2011  . Paroxysmal atrial fibrillation ([place]) 11/27/2019  . Peripheral vascular disease ([place])   . Shingles   . Stenosis of popliteal artery (HCC)    blood clots in legs long ago    . Trigeminal neuralgia    Left buttocks    MEDICATIONS: Current Outpatient Medications on File Prior to Visit  Medication Sig Dispense Refill  . acetaminophen (TYLENOL) 325 MG tablet Take 1-2 tablets (325-650 mg total) by mouth every 4 (four) hours as needed for mild pain (or temp >/= 101 F).    Marland Kitchen atorvastatin (LIPITOR) 40 MG tablet Take 1 tablet (40  mg total) by mouth daily. 90 tablet 3  . baclofen (LIORESAL) 10 MG tablet TAKE 1/2 (ONE-HALF) TABLET BY MOUTH ONCE DAILY NIGHTLY 30 tablet 0  . clonazePAM (KLONOPIN) 0.25 MG disintegrating tablet Take 1 tablet (0.25 mg total) by mouth at bedtime as needed (anxiety). 20 tablet 0  . ELIQUIS 5 MG TABS tablet Take 1 tablet by mouth twice daily 180 tablet 1  . furosemide (LASIX) 20 MG tablet Take 1 tablet (20 mg total) by mouth as needed (may take if weight > 152 lbs). 30 tablet 0  . ipratropium (ATROVENT) 0.06 % nasal spray  USE 2 SPRAY(S) IN EACH NOSTRIL THREE TIMES DAILY 15 mL 5  . lamoTRIgine (LAMICTAL) 100 MG tablet Take 1 tablet by mouth twice daily 60 tablet 0  . lisinopril (ZESTRIL) 5 MG tablet Take 1 tablet (5 mg total) by mouth daily. 30 tablet 3  . metoprolol succinate (TOPROL-XL) 25 MG 24 hr tablet Take 3 tablets (75 mg total) by mouth daily. 270 tablet 1  . Multiple Vitamin (MULTIVITAMIN WITH MINERALS) TABS tablet Take 1 tablet by mouth daily.    . pantoprazole (PROTONIX) 40 MG tablet Take one tablet twice daily as directed 60 tablet 11  . polycarbophil (FIBERCON) 625 MG tablet Take 1 tablet (625 mg total) by mouth daily. 30 tablet 0  . RESTASIS 0.05 % ophthalmic emulsion Place 1 drop into both eyes 2 (two) times daily as needed (dry eyes).   99   No current facility-administered medications on file prior to visit.    ALLERGIES: Allergies  Allergen Reactions  . Sulfa Antibiotics Other (See Comments)    Cold sweat light headed and disorientation  . Tiotropium Bromide Shortness Of Breath and Other (See Comments)    Sore throat also  . Latex Itching and Rash  . Ultram [Tramadol] Nausea And Vomiting  . 5-Alpha Reductase Inhibitors     FAMILY HISTORY: Family History  Problem Relation Age of Onset  . Arthritis Mother   . Hypertension Mother   . Heart disease Father   . Colon polyps Father   . Breast cancer Sister   . Breast cancer Daughter   . Hypertension Son   . Lung cancer Brother   . Colon cancer Sister   . Brain cancer Sister   . Lung cancer Sister   . Anesthesia problems Neg Hx   . Hypotension Neg Hx   . Malignant hyperthermia Neg Hx   . Pseudochol deficiency Neg Hx       Objective:  Blood pressure (!) 159/80, pulse 85, resp. rate 20, height 4\' 11"  (1.499 m), weight 158 lb (71.7 kg), SpO2 95 %. General: No acute distress.  Patient appears well-groomed.   Head:  Normocephalic/atraumatic Eyes:  Fundi examined but not visualized Neck: supple, no paraspinal tenderness, full range  of motion Heart:  Regular rate and rhythm Lungs:  Clear to auscultation bilaterally Back: No paraspinal tenderness Neurological Exam: alert and oriented to person, place, and time. Speech fluent and not dysarthric, language intact.  CN II-XII intact. Bulk and tone normal, muscle strength 5/5 throughout.  Sensation to light touch  intact.  Deep tendon reflexes 2+ throughout.  Finger to nose testing intact.  Gait steady, Romberg negative.     Metta Clines, DO  CC: Marin Olp, MD

## 2020-08-28 ENCOUNTER — Other Ambulatory Visit: Payer: Self-pay

## 2020-08-28 ENCOUNTER — Encounter: Payer: Self-pay | Admitting: Neurology

## 2020-08-28 ENCOUNTER — Ambulatory Visit (INDEPENDENT_AMBULATORY_CARE_PROVIDER_SITE_OTHER): Payer: Medicare Other | Admitting: Neurology

## 2020-08-28 VITALS — BP 159/80 | HR 85 | Resp 20 | Ht 59.0 in | Wt 158.0 lb

## 2020-08-28 DIAGNOSIS — G500 Trigeminal neuralgia: Secondary | ICD-10-CM

## 2020-08-28 NOTE — Patient Instructions (Signed)
1.  Decrease lamotrigine to 50mg  twice daily for one week, then 50mg  at bedtime for one week, then STOP 2.  Contact me with any issues 3.  Otherwise, follow up 6 months.

## 2020-09-01 ENCOUNTER — Other Ambulatory Visit: Payer: Self-pay | Admitting: General Practice

## 2020-09-04 ENCOUNTER — Encounter: Payer: Medicare Other | Attending: Physical Medicine & Rehabilitation | Admitting: Physical Medicine and Rehabilitation

## 2020-09-04 ENCOUNTER — Other Ambulatory Visit: Payer: Self-pay

## 2020-09-04 ENCOUNTER — Encounter: Payer: Self-pay | Admitting: Physical Medicine and Rehabilitation

## 2020-09-04 VITALS — BP 116/79 | HR 80 | Temp 97.9°F | Ht 59.0 in | Wt 157.6 lb

## 2020-09-04 DIAGNOSIS — M791 Myalgia, unspecified site: Secondary | ICD-10-CM

## 2020-09-04 NOTE — Progress Notes (Signed)
Trigger Point Injection  Indication: Lumbar myofascial pain not relieved by medication management and other conservative care.  Informed consent was obtained after describing risk and benefits of the procedure with the patient, this includes bleeding, bruising, infection and medication side effects.  The patient wishes to proceed and has given written consent.  The patient was placed in a seated position.  The cervical area was marked and prepped with Betadine.  It was entered with a 25-gauge 1/2 inch needle and a total of 5 mL of 1% lidocaine and normal saline was injected into a total of 5 trigger points, after negative draw back for blood.  The patient tolerated the procedure well.  Post procedure instructions were given.

## 2020-09-09 ENCOUNTER — Other Ambulatory Visit: Payer: Self-pay

## 2020-09-09 ENCOUNTER — Ambulatory Visit: Payer: Medicare Other | Attending: Physical Medicine & Rehabilitation

## 2020-09-09 DIAGNOSIS — R252 Cramp and spasm: Secondary | ICD-10-CM | POA: Insufficient documentation

## 2020-09-09 DIAGNOSIS — G8929 Other chronic pain: Secondary | ICD-10-CM | POA: Diagnosis not present

## 2020-09-09 DIAGNOSIS — M6281 Muscle weakness (generalized): Secondary | ICD-10-CM | POA: Insufficient documentation

## 2020-09-09 DIAGNOSIS — M545 Low back pain, unspecified: Secondary | ICD-10-CM | POA: Insufficient documentation

## 2020-09-09 DIAGNOSIS — R2689 Other abnormalities of gait and mobility: Secondary | ICD-10-CM | POA: Insufficient documentation

## 2020-09-09 NOTE — Patient Instructions (Signed)
Access Code: [email or web address] URL: [email or web address] Date: 09/09/2020 Prepared by: Claiborne Billings  Exercises Seated Hamstring Stretch - 2 x daily - 7 x weekly - 1 sets - 3 reps - 20 hold Sit to Stand - 2 x daily - 7 x weekly - 3 sets - 5 reps Heel rises with counter support - 2 x daily - 7 x weekly - 2 sets - 10 reps Supine Lower Trunk Rotation - 1-2 x daily - 7 x weekly - 1 sets - 3 reps - 20 hold Seated Long Arc Quad - 2 x daily - 7 x weekly - 2 sets - 5 reps - 5 hold Seated Transversus Abdominis Bracing - 3 x daily - 7 x weekly - 5 hold

## 2020-09-09 NOTE — Therapy (Signed)
[facility] [address], [place], [name], [address] Phone: [phone]   Fax:  [phone]  Physical Therapy Treatment  Patient Details  Name: [name] [name] MRN: [id number] Date of Birth: [birth year] Referring Provider (PT): Delice Lesch, MD   Encounter Date: 09/09/2020   PT End of Session - 09/09/20 1246    Visit Number 1    Date for PT Re-Evaluation 11/04/20    Authorization Type Medicare/BCBS    Progress Note Due on Visit 10    PT Start Time 1151    PT Stop Time 1229    PT Time Calculation (min) 38 min    Activity Tolerance Patient tolerated treatment well    Behavior During Therapy [facility] for tasks assessed/performed           Past Medical History:  Diagnosis Date  . Allergic rhinoconjunctivitis   . Allergy   . Anxiety    pt. managed- uses deep breathing   . Arthritis    low back , stenosis  . COLONIC POLYPS, RECURRENT 08/29/2006   2008 last colonoscopy. No further colonoscopy.     Marland Kitchen COPD (chronic obstructive pulmonary disease) ([place])   . Diverticulosis   . Dysrhythmia    afib  . Eczema   . Environmental allergies    allergy shot- q friday in Dr. Janee Morn office. PFT's abnormal- recommended Spiriva to use preop & will d/c after surgery  . GERD (gastroesophageal reflux disease)   . Hiatal hernia   . Hyperlipidemia   . Hypertension   . Lung nodule 2011  . Paroxysmal atrial fibrillation ([place]) 11/27/2019  . Peripheral vascular disease ([place])   . Shingles   . Stenosis of popliteal artery (HCC)    blood clots in legs long ago    . Trigeminal neuralgia    Left buttocks    Past Surgical History:  Procedure Laterality Date  . ABDOMINAL AORTAGRAM N/A 06/17/2011   Procedure: ABDOMINAL Maxcine Ham;  Surgeon: Elam Dutch, MD;  Location: [facility] CATH LAB;  Service: Cardiovascular;  Laterality: N/A;  . ABDOMINAL AORTOGRAM W/LOWER EXTREMITY Bilateral 06/22/2018   Procedure: ABDOMINAL AORTOGRAM W/LOWER EXTREMITY;   Surgeon: Elam Dutch, MD;  Location: [facility] CV LAB;  Service: Cardiovascular;  Laterality: Bilateral;  . ABDOMINAL AORTOGRAM W/LOWER EXTREMITY Bilateral 11/29/2019   Procedure: ABDOMINAL AORTOGRAM W/LOWER EXTREMITY;  Surgeon: Elam Dutch, MD;  Location: [facility] CV LAB;  Service: Cardiovascular;  Laterality: Bilateral;  . CATARACT EXTRACTION, BILATERAL     w IOL  . CHOLECYSTECTOMY  1998  . COLONOSCOPY    . ENDARTERECTOMY FEMORAL Left 12/09/2019   Procedure: ENDARTERECTOMY FEMORAL with bovine patch angioplasty.;  Surgeon: Elam Dutch, MD;  Location: [facility];  Service: Vascular;  Laterality: Left;  . FEMORAL-POPLITEAL BYPASS GRAFT        x2 surgeries 1990's & 2009  . FEMORAL-POPLITEAL BYPASS GRAFT Right 12/09/2019   Procedure: REDO RIGHT FEMORAL-POPLITEAL ARTERY BYPASS GRAFT;  Surgeon: Elam Dutch, MD;  Location: [facility];  Service: Vascular;  Laterality: Right;  . INJECTION KNEE Right Aug. 2016   Gel injection for pain  . LUMBAR FUSION  07/06/2011  . TONSILLECTOMY     as a teenager   . TUBAL LIGATION      There were no vitals filed for this visit.   Subjective Assessment - 09/09/20 1154    Subjective Pt with history of chronic LBP and leg pain due to vascular surgeries.  Pt with complaint of LBP that  is chronic and leg pain/weakness due to recent leg surgery.    Pertinent History COPD, HTN, multiple vascular surgeries in legs, chronic LBP    Limitations Standing;Walking    How long can you stand comfortably? 3-4 minutes    How long can you walk comfortably? 3-4 minutes    Patient Stated Goals get legs stronger, walk longer, reduce LBP    Currently in Pain? Yes    Pain Score 4    10/10 in the morning hours   Pain Location Back    Pain Orientation Left;Right;Lower    Pain Descriptors / Indicators Constant    Pain Type Chronic pain    Pain Onset More than a month ago    Pain Frequency Constant    Aggravating Factors  getting out of bed and standing up,  standing/walking    Pain Relieving Factors sit down, exercise              [facility] PT Assessment - 09/09/20 0001      Assessment   Medical Diagnosis chronic pain syndrome, failed back syndrome, abnormality of gait    Referring Provider (PT) Delice Lesch, MD    Onset Date/Surgical Date --   chronic   Prior Therapy nothing recent      Precautions   Precautions Fall      Restrictions   Weight Bearing Restrictions No      Balance Screen   Has the patient fallen in the past 6 months No    Has the patient had a decrease in activity level because of a fear of falling?  No    Is the patient reluctant to leave their home because of a fear of falling?  No      Home Environment   Living Environment Private residence    Living Arrangements Alone    Type of [place] One level      Prior Function   Level of [place] Retired    Leisure daily exercise      Cognition   Overall Cognitive Status Within Functional Limits for tasks assessed      Observation/Other Assessments   Focus on Therapeutic Outcomes (FOTO)  43 (goal is 49)      Posture/Postural Control   Posture/Postural Control Postural limitations    Postural Limitations Forward head;Rounded Shoulders;Flexed trunk      ROM / Strength   AROM / PROM / Strength AROM;PROM;Strength      AROM   Overall AROM  Deficits    Overall AROM Comments lumbar A/ROM is limited by 25% into sidebending and extension with lumbar pain.      PROM   Overall PROM  Deficits    Overall PROM Comments hip flexibility limited by 25% without pain      Strength   Overall Strength Deficits    Overall Strength Comments bil hips 4/5, knees 4+/5, ankles 4+/5      Palpation   Palpation comment diffuse palpable tenderness over bil lumbar parapsinals and proximal gluteals      Transfers   Transfers Stand to Sit;Sit to Stand    Sit to Stand 6: Modified independent (Device/Increase time);With upper extremity  assist    Five time sit to stand comments  14.25 seconds    Stand to Sit 6: Modified independent (Device/Increase time);With upper extremity assist      Ambulation/Gait   Ambulation/Gait Yes    Ambulation/Gait Assistance 6: Modified independent (Device/Increase time)  Assistive device Straight cane    Gait Pattern Step-through pattern;Trunk flexed;Wide base of support                                 PT Education - 09/09/20 1229    Education Details Access Code: [email or web address]    Person(s) Educated Patient    Methods Explanation;Demonstration;Handout    Comprehension Verbalized understanding;Returned demonstration            PT Short Term Goals - 09/09/20 1149      PT SHORT TERM GOAL #1   Title independent with initial HEP    Time 4    Period Weeks    Status New    Target Date 10/07/20      PT SHORT TERM GOAL #2   Title improved upright posture in standing due to increased hip extension and increased glute strength    Time 4    Period Weeks    Status New    Target Date 10/07/20      PT SHORT TERM GOAL #3   Title able to stand for 8-10 minutes due to increased LE strength and endurance    Time 4    Period Weeks    Status New    Target Date 10/07/20             PT Long Term Goals - 09/09/20 1152      PT LONG TERM GOAL #1   Title independent with advanced HEP    Time 8    Period Weeks    Status New    Target Date 11/04/20      PT LONG TERM GOAL #2   Title improve FOTO to > or = to 49 to improve function    Baseline 43    Time 8    Period Weeks    Status New    Target Date 11/04/20      PT LONG TERM GOAL #3   Title improve LE endurance and strength to stand and walk for 10-15 minutes without significant limitation    Time 8    Period Weeks    Status New    Target Date 11/04/20      PT LONG TERM GOAL #4   Title report < or = to 6-7/10 LBP in the morning hours to improve function    Time 8    Period Weeks    Status New     Target Date 11/04/20      PT LONG TERM GOAL #5   Title perform 5x sit to stand in < or = to 12 seconds to improve balance    Baseline --    Time 8    Period Weeks    Status New    Target Date 11/11/20                 Plan - 09/09/20 1243    Clinical Impression Statement Pt presents to PT with chronic LBP and bil LE weakness.  Pt has had LBP for years.  Pt has history of lumbar surgery many years ago.  Pt with recent leg weakness due to vascular surgery.  Pt was using a walker during recovery from surgery and legs lost strength.  Pt is now using a cane for all ambulation.  Pt is limited to 3-4 minutes standing and walking due to LE weakness/fatigue.  Pt reports constant LBP and rates this 5-0/10 with  worse pain in the morning.  Pain reduces as the day progresses.  Pt is active daily with leg exercises and a floor cycle that she rides for 10 minutes a day.  Pt demonstrates painful and limited lumbar sidebending and extension and hip flexibility is limited by 25% bilaterally with mild pain in the lumbar spine reported.  Pt performed 5x sit to stand in 14.25 seconds without UE support and demonstrated reduced eccentric control with final 1/3 of the stand to sit motion.  Pt with diffuse palpable tenderness over bil lumbar paraspinals.  Pt ambulates with a standard cane and reduced gait velocity.  Pt will benefit from skilled PT to address balance, functional LE strength and chronic LBP to improve mobility and function.    Personal Factors and Comorbidities Comorbidity 3+    Comorbidities lumbar surgery, multiple leg surgeries and weakness, COPD, age, 2 body parts    Examination-Activity Limitations Locomotion Level;Stand;Stairs    Examination-Participation Restrictions Community Activity;Shop    Stability/Clinical Decision Making Evolving/Moderate complexity    Clinical Decision Making Low    Rehab Potential Good    PT Frequency 2x / week    PT Duration 8 weeks    PT Treatment/Interventions  ADLs/Self Care Home Management;Cryotherapy;Electrical Stimulation;Moist Heat;Traction;Stair training;Gait training;Therapeutic activities;Therapeutic exercise;Balance training;Neuromuscular re-education;Patient/family education;Manual techniques;Dry needling;Passive range of motion    PT Next Visit Plan review HEP, balance and LE strength and endurance, incorporate core to address LBP    PT Home Exercise Plan Access Code: [email or web address]    Consulted and Agree with Plan of Care Patient           Patient will benefit from skilled therapeutic intervention in order to improve the following deficits and impairments:  Abnormal gait,Decreased activity tolerance,Decreased strength,Impaired flexibility,Postural dysfunction,Pain,Decreased range of motion,Difficulty walking,Decreased endurance,Decreased balance  Visit Diagnosis: Chronic bilateral low back pain without sciatica - Plan: PT plan of care cert/re-cert  Other abnormalities of gait and mobility - Plan: PT plan of care cert/re-cert  Cramp and spasm - Plan: PT plan of care cert/re-cert  Muscle weakness (generalized) - Plan: PT plan of care cert/re-cert     Problem List Patient Active Problem List   Diagnosis Date Noted  . Primary osteoarthritis of right knee 06/02/2020  . Left groin hernia 05/22/2020  . Sleep disturbance 01/09/2020  . Acute lower UTI   . Anxiety state   . Peripheral edema   . Leukocytosis   . Debility 12/13/2019  . Dyslipidemia   . Rectal bleeding   . Acute blood loss anemia   . Ischemic colitis ([place])   . Status post femoral-popliteal bypass surgery 12/09/2019  . Secondary hypercoagulable state ([place]) 12/03/2019  . Paroxysmal atrial fibrillation ([place]) 11/27/2019  . Chronic bilateral low back pain with right-sided sciatica 08/29/2019  . Chronic pain of right knee 08/29/2019  . Abnormality of gait 05/30/2019  . Neuropathic pain 05/30/2019  . Myalgia 08/08/2018  . Chronic pain syndrome 06/27/2018  . Gross hematuria  06/01/2018  . Obesity (BMI 30.0-34.9) 11/14/2017  . Failed back syndrome 09/28/2017  . CKD (chronic kidney disease), stage III ([place]) 08/07/2017  . Unilateral primary osteoarthritis, right knee 04/07/2016  . Acute gout of left foot 03/22/2016  . Pain in right ankle and joints of right foot 08/28/2014  . Trigeminal neuralgia   . Encounter for therapeutic drug monitoring 10/21/2013  . Syncope 12/29/2011  . Chronic low back pain 09/13/2010  . Allergic rhinitis due to pollen 06/25/2010  . COPD mixed type ([place]) 01/22/2010  . Solitary  pulmonary nodule 01/12/2010  . Insulin resistance 01/06/2009  . Osteopenia 01/06/2009  . Eczema 11/26/2007  . Peripheral arterial disease ([place]) 06/29/2007  . Hyperlipidemia 10/23/2006  . Essential hypertension 10/23/2006  . ESOPHAGEAL STRICTURE 08/09/1993    Sigurd Sos, PT 09/09/20 12:57 PM  [facility] [address] [place], [name], [address] Phone: [phone]   Fax:  [phone]  Name: SARANNE PINTA MRN: [id number] Date of Birth: November 23, 1924

## 2020-09-14 ENCOUNTER — Other Ambulatory Visit: Payer: Self-pay

## 2020-09-14 ENCOUNTER — Encounter: Payer: Self-pay | Admitting: Physical Therapy

## 2020-09-14 ENCOUNTER — Ambulatory Visit: Payer: Medicare Other | Admitting: Physical Therapy

## 2020-09-14 DIAGNOSIS — R2689 Other abnormalities of gait and mobility: Secondary | ICD-10-CM | POA: Diagnosis not present

## 2020-09-14 DIAGNOSIS — M6281 Muscle weakness (generalized): Secondary | ICD-10-CM

## 2020-09-14 DIAGNOSIS — R252 Cramp and spasm: Secondary | ICD-10-CM | POA: Diagnosis not present

## 2020-09-14 DIAGNOSIS — M545 Low back pain, unspecified: Secondary | ICD-10-CM | POA: Diagnosis not present

## 2020-09-14 DIAGNOSIS — G8929 Other chronic pain: Secondary | ICD-10-CM

## 2020-09-14 NOTE — Therapy (Signed)
[facility] [address], [place], [name], [address] Phone: [phone]   Fax:  [phone]  Physical Therapy Treatment  Patient Details  Name: [name] [name] MRN: [id number] Date of Birth: [birth year] Referring Provider (PT): Delice Lesch, MD   Encounter Date: 09/14/2020   PT End of Session - 09/14/20 1057    Visit Number 2    Date for PT Re-Evaluation 11/04/20    Authorization Type Medicare/BCBS    Progress Note Due on Visit 10    PT Start Time 1057    PT Stop Time 1139    PT Time Calculation (min) 42 min    Activity Tolerance Patient tolerated treatment well    Behavior During Therapy [facility] for tasks assessed/performed           Past Medical History:  Diagnosis Date  . Allergic rhinoconjunctivitis   . Allergy   . Anxiety    pt. managed- uses deep breathing   . Arthritis    low back , stenosis  . COLONIC POLYPS, RECURRENT 08/29/2006   2008 last colonoscopy. No further colonoscopy.     Marland Kitchen COPD (chronic obstructive pulmonary disease) ([place])   . Diverticulosis   . Dysrhythmia    afib  . Eczema   . Environmental allergies    allergy shot- q friday in Dr. Janee Morn office. PFT's abnormal- recommended Spiriva to use preop & will d/c after surgery  . GERD (gastroesophageal reflux disease)   . Hiatal hernia   . Hyperlipidemia   . Hypertension   . Lung nodule 2011  . Paroxysmal atrial fibrillation ([place]) 11/27/2019  . Peripheral vascular disease ([place])   . Shingles   . Stenosis of popliteal artery (HCC)    blood clots in legs long ago    . Trigeminal neuralgia    Left buttocks    Past Surgical History:  Procedure Laterality Date  . ABDOMINAL AORTAGRAM N/A 06/17/2011   Procedure: ABDOMINAL Maxcine Ham;  Surgeon: Elam Dutch, MD;  Location: [facility] CATH LAB;  Service: Cardiovascular;  Laterality: N/A;  . ABDOMINAL AORTOGRAM W/LOWER EXTREMITY Bilateral 06/22/2018   Procedure: ABDOMINAL AORTOGRAM W/LOWER EXTREMITY;   Surgeon: Elam Dutch, MD;  Location: [place] CV LAB;  Service: Cardiovascular;  Laterality: Bilateral;  . ABDOMINAL AORTOGRAM W/LOWER EXTREMITY Bilateral 11/29/2019   Procedure: ABDOMINAL AORTOGRAM W/LOWER EXTREMITY;  Surgeon: Elam Dutch, MD;  Location: [place] CV LAB;  Service: Cardiovascular;  Laterality: Bilateral;  . CATARACT EXTRACTION, BILATERAL     w IOL  . CHOLECYSTECTOMY  1998  . COLONOSCOPY    . ENDARTERECTOMY FEMORAL Left 12/09/2019   Procedure: ENDARTERECTOMY FEMORAL with bovine patch angioplasty.;  Surgeon: Elam Dutch, MD;  [facility]: [facility];  Service: Vascular;  Laterality: Left;  . FEMORAL-POPLITEAL BYPASS GRAFT        x2 surgeries 1990's & 2009  . FEMORAL-POPLITEAL BYPASS GRAFT Right 12/09/2019   Procedure: REDO RIGHT FEMORAL-POPLITEAL ARTERY BYPASS GRAFT;  Surgeon: Elam Dutch, MD;  Location: [place];  Service: Vascular;  Laterality: Right;  . INJECTION KNEE Right Aug. 2016   Gel injection for pain  . LUMBAR FUSION  07/06/2011  . TONSILLECTOMY     as a teenager   . TUBAL LIGATION      There were no vitals filed for this visit.   Subjective Assessment - 09/14/20 1059    Subjective Doing my HEP and my normal exercises. Pain is down to about a 4/10 this AM after my Tylenol and  bed exercises.    Pertinent History COPD, HTN, multiple vascular surgeries in legs, chronic LBP    Limitations Standing;Walking    Currently in Pain? Yes    Pain Score 4     Pain Location Back    Pain Orientation Lower    Pain Descriptors / Indicators Constant    Aggravating Factors  Standing, walking    Pain Relieving Factors sitting, meds, doing my exercises    Multiple Pain Sites No                             [facility] Adult PT Treatment/Exercise - 09/14/20 0001      Lumbar Exercises: Stretches   Active Hamstring Stretch Left;Right;3 reps;10 seconds    Active Hamstring Stretch Limitations seated      Lumbar Exercises: Aerobic   Nustep L1 x 6  min      Lumbar Exercises: Standing   Heel Raises 10 reps    Row Strengthening;Both;10 reps;Theraband    Theraband Level (Row) Level 2 (Red)    Shoulder Extension Strengthening;Both;10 reps;Theraband    Theraband Level (Shoulder Extension) Level 2 (Red)      Lumbar Exercises: Seated   Long Arc Quad on Chair Strengthening;AROM;Both;1 set;10 reps    Sit to Stand 10 reps   low mat 2 sets of 5   Other Seated Lumbar Exercises Pilates breath with ball squeeze and TA contraction 5x2      Knee/Hip Exercises: Standing   Hip Flexion AROM;Stengthening;Both;1 set;Knee bent    Hip Abduction Stengthening;Both;AROM;1 set;10 reps                    PT Short Term Goals - 09/09/20 1149      PT SHORT TERM GOAL #1   Title independent with initial HEP    Time 4    Period Weeks    Status New    Target Date 10/07/20      PT SHORT TERM GOAL #2   Title improved upright posture in standing due to increased hip extension and increased glute strength    Time 4    Period Weeks    Status New    Target Date 10/07/20      PT SHORT TERM GOAL #3   Title able to stand for 8-10 minutes due to increased LE strength and endurance    Time 4    Period Weeks    Status New    Target Date 10/07/20             PT Long Term Goals - 09/09/20 1152      PT LONG TERM GOAL #1   Title independent with advanced HEP    Time 8    Period Weeks    Status New    Target Date 11/04/20      PT LONG TERM GOAL #2   Title improve FOTO to > or = to 49 to improve function    Baseline 43    Time 8    Period Weeks    Status New    Target Date 11/04/20      PT LONG TERM GOAL #3   Title improve LE endurance and strength to stand and walk for 10-15 minutes without significant limitation    Time 8    Period Weeks    Status New    Target Date 11/04/20      PT LONG TERM GOAL #4  Title report < or = to 6-7/10 LBP in the morning hours to improve function    Time 8    Period Weeks    Status New    Target  Date 11/04/20      PT LONG TERM GOAL #5   Title perform 5x sit to stand in < or = to 12 seconds to improve balance    Baseline --    Time 8    Period Weeks    Status New    Target Date 11/11/20                 Plan - 09/14/20 1058    Clinical Impression Statement Pt independent and compliant with initial HEP. Pt did need to practice her abdominal contractions as she had questions about it. Pt was concerned she wasn't "feeling" her abdominals. We were able to have pt squeeze a ball ( pillow at home) to squeeze along with her exhale to better facilitate her core contraction.    Personal Factors and Comorbidities Comorbidity 3+    Comorbidities lumbar surgery, multiple leg surgeries and weakness, COPD, age, 2 body parts    Examination-Activity Limitations Locomotion Level;Stand;Stairs    Examination-Participation Restrictions Community Activity;Shop    Stability/Clinical Decision Making Evolving/Moderate complexity    Rehab Potential Good    PT Frequency 2x / week    PT Duration 8 weeks    PT Treatment/Interventions ADLs/Self Care Home Management;Cryotherapy;Electrical Stimulation;Moist Heat;Traction;Stair training;Gait training;Therapeutic activities;Therapeutic exercise;Balance training;Neuromuscular re-education;Patient/family education;Manual techniques;Dry needling;Passive range of motion    PT Next Visit Plan Give standing rows & extensions for HEP with emphasis on posture and core contraction.    PT Home Exercise Plan Access Code: [email or web address]    Consulted and Agree with Plan of Care Patient           Patient will benefit from skilled therapeutic intervention in order to improve the following deficits and impairments:  Abnormal gait,Decreased activity tolerance,Decreased strength,Impaired flexibility,Postural dysfunction,Pain,Decreased range of motion,Difficulty walking,Decreased endurance,Decreased balance  Visit Diagnosis: Chronic bilateral low back pain without  sciatica  Other abnormalities of gait and mobility  Cramp and spasm  Muscle weakness (generalized)     Problem List Patient Active Problem List   Diagnosis Date Noted  . Primary osteoarthritis of right knee 06/02/2020  . Left groin hernia 05/22/2020  . Sleep disturbance 01/09/2020  . Acute lower UTI   . Anxiety state   . Peripheral edema   . Leukocytosis   . Debility 12/13/2019  . Dyslipidemia   . Rectal bleeding   . Acute blood loss anemia   . Ischemic colitis ([place])   . Status post femoral-popliteal bypass surgery 12/09/2019  . Secondary hypercoagulable state ([place]) 12/03/2019  . Paroxysmal atrial fibrillation ([place]) 11/27/2019  . Chronic bilateral low back pain with right-sided sciatica 08/29/2019  . Chronic pain of right knee 08/29/2019  . Abnormality of gait 05/30/2019  . Neuropathic pain 05/30/2019  . Myalgia 08/08/2018  . Chronic pain syndrome 06/27/2018  . Gross hematuria 06/01/2018  . Obesity (BMI 30.0-34.9) 11/14/2017  . Failed back syndrome 09/28/2017  . CKD (chronic kidney disease), stage III ([place]) 08/07/2017  . Unilateral primary osteoarthritis, right knee 04/07/2016  . Acute gout of left foot 03/22/2016  . Pain in right ankle and joints of right foot 08/28/2014  . Trigeminal neuralgia   . Encounter for therapeutic drug monitoring 10/21/2013  . Syncope 12/29/2011  . Chronic low back pain 09/13/2010  . Allergic rhinitis due to pollen 06/25/2010  .  COPD mixed type ([place]) 01/22/2010  . Solitary pulmonary nodule 01/12/2010  . Insulin resistance 01/06/2009  . Osteopenia 01/06/2009  . Eczema 11/26/2007  . Peripheral arterial disease ([place]) 06/29/2007  . Hyperlipidemia 10/23/2006  . Essential hypertension 10/23/2006  . ESOPHAGEAL STRICTURE 08/09/1993    Demetres Prochnow, PTA 09/14/2020, 11:41 AM  [facility] [address], [place], [name], [address] Phone: [phone]   Fax:   [phone]  Name: AYSA LARIVEE MRN: [id number] Date of Birth: 1925-04-21

## 2020-09-16 ENCOUNTER — Other Ambulatory Visit: Payer: Self-pay

## 2020-09-16 ENCOUNTER — Ambulatory Visit: Payer: Medicare Other

## 2020-09-16 DIAGNOSIS — M6281 Muscle weakness (generalized): Secondary | ICD-10-CM

## 2020-09-16 DIAGNOSIS — R2689 Other abnormalities of gait and mobility: Secondary | ICD-10-CM | POA: Diagnosis not present

## 2020-09-16 DIAGNOSIS — M545 Low back pain, unspecified: Secondary | ICD-10-CM | POA: Diagnosis not present

## 2020-09-16 DIAGNOSIS — R252 Cramp and spasm: Secondary | ICD-10-CM

## 2020-09-16 DIAGNOSIS — G8929 Other chronic pain: Secondary | ICD-10-CM

## 2020-09-16 NOTE — Therapy (Signed)
[facility] [address], [place], [name], [address] Phone: [phone]   Fax:  [phone]  Physical Therapy Treatment  Patient Details  Name: [name] [name] MRN: [id number] Date of Birth: January 18, 1925 Referring Provider (PT): Delice Lesch, MD   Encounter Date: 09/16/2020   PT End of Session - 09/16/20 1107    Visit Number 3    Date for PT Re-Evaluation 11/04/20    Authorization Type Medicare/BCBS    Progress Note Due on Visit 10    PT Start Time 1019    PT Stop Time 1100    PT Time Calculation (min) 41 min    Activity Tolerance Patient tolerated treatment well    Behavior During Therapy [facility] for tasks assessed/performed           Past Medical History:  Diagnosis Date  . Allergic rhinoconjunctivitis   . Allergy   . Anxiety    pt. managed- uses deep breathing   . Arthritis    low back , stenosis  . COLONIC POLYPS, RECURRENT 08/29/2006   2008 last colonoscopy. No further colonoscopy.     Marland Kitchen COPD (chronic obstructive pulmonary disease) ([place])   . Diverticulosis   . Dysrhythmia    afib  . Eczema   . Environmental allergies    allergy shot- q friday in Dr. Janee Morn office. PFT's abnormal- recommended Spiriva to use preop & will d/c after surgery  . GERD (gastroesophageal reflux disease)   . Hiatal hernia   . Hyperlipidemia   . Hypertension   . Lung nodule 2011  . Paroxysmal atrial fibrillation ([place]) 11/27/2019  . Peripheral vascular disease ([place])   . Shingles   . Stenosis of popliteal artery (HCC)    blood clots in legs long ago    . Trigeminal neuralgia    Left buttocks    Past Surgical History:  Procedure Laterality Date  . ABDOMINAL AORTAGRAM N/A 06/17/2011   Procedure: ABDOMINAL Maxcine Ham;  Surgeon: Elam Dutch, MD;  Location: [facility] CATH LAB;  Service: Cardiovascular;  Laterality: N/A;  . ABDOMINAL AORTOGRAM W/LOWER EXTREMITY Bilateral 06/22/2018   Procedure: ABDOMINAL AORTOGRAM W/LOWER EXTREMITY;   Surgeon: Elam Dutch, MD;  Location: [place] CV LAB;  Service: Cardiovascular;  Laterality: Bilateral;  . ABDOMINAL AORTOGRAM W/LOWER EXTREMITY Bilateral 11/29/2019   Procedure: ABDOMINAL AORTOGRAM W/LOWER EXTREMITY;  Surgeon: Elam Dutch, MD;  Location: [place] CV LAB;  Service: Cardiovascular;  Laterality: Bilateral;  . CATARACT EXTRACTION, BILATERAL     w IOL  . CHOLECYSTECTOMY  1998  . COLONOSCOPY    . ENDARTERECTOMY FEMORAL Left 12/09/2019   Procedure: ENDARTERECTOMY FEMORAL with bovine patch angioplasty.;  Surgeon: Elam Dutch, MD;  Location: [facility] OR;  Service: Vascular;  Laterality: Left;  . FEMORAL-POPLITEAL BYPASS GRAFT        x2 surgeries 1990's & 2009  . FEMORAL-POPLITEAL BYPASS GRAFT Right 12/09/2019   Procedure: REDO RIGHT FEMORAL-POPLITEAL ARTERY BYPASS GRAFT;  Surgeon: Elam Dutch, MD;  Location: [place];  Service: Vascular;  Laterality: Right;  . INJECTION KNEE Right Aug. 2016   Gel injection for pain  . LUMBAR FUSION  07/06/2011  . TONSILLECTOMY     as a teenager   . TUBAL LIGATION      There were no vitals filed for this visit.   Subjective Assessment - 09/16/20 1023    Subjective I'm not moving well today.    Patient Stated Goals get legs stronger, walk longer, reduce LBP  Currently in Pain? Yes    Pain Score 5     Pain Location Back    Pain Orientation Lower    Pain Descriptors / Indicators Constant    Pain Type Chronic pain    Pain Onset More than a month ago    Pain Frequency Constant    Aggravating Factors  standing and walking    Pain Relieving Factors sitting, meds, movment                             [facility] Adult PT Treatment/Exercise - 09/16/20 0001      Lumbar Exercises: Stretches   Active Hamstring Stretch Left;Right;3 reps;10 seconds    Active Hamstring Stretch Limitations seated      Lumbar Exercises: Aerobic   Nustep L2 x 8 min   PT present to discuss progress     Lumbar Exercises: Standing    Heel Raises 20 reps    Row Strengthening;Both;Theraband;20 reps    Theraband Level (Row) Level 2 (Red)    Shoulder Extension Strengthening;Both;Theraband;20 reps    Theraband Level (Shoulder Extension) Level 2 (Red)      Lumbar Exercises: Seated   Sit to Stand 10 reps   low mat 2 sets of 5   Other Seated Lumbar Exercises press into foam roll with TA activation x 10      Knee/Hip Exercises: Standing   Heel Raises Both;20 reps    Rebounder weight shifting 3 ways x 1 min each.  Min use of UEs    Other Standing Knee Exercises weight shifting on foam pad and disuccsion regarding getting one for home                    PT Short Term Goals - 09/16/20 1025      PT SHORT TERM GOAL #1   Title independent with initial HEP    Status On-going      PT SHORT TERM GOAL #2   Title improved upright posture in standing due to increased hip extension and increased glute strength    Status On-going             PT Long Term Goals - 09/09/20 1152      PT LONG TERM GOAL #1   Title independent with advanced HEP    Time 8    Period Weeks    Status New    Target Date 11/04/20      PT LONG TERM GOAL #2   Title improve FOTO to > or = to 49 to improve function    Baseline 43    Time 8    Period Weeks    Status New    Target Date 11/04/20      PT LONG TERM GOAL #3   Title improve LE endurance and strength to stand and walk for 10-15 minutes without significant limitation    Time 8    Period Weeks    Status New    Target Date 11/04/20      PT LONG TERM GOAL #4   Title report < or = to 6-7/10 LBP in the morning hours to improve function    Time 8    Period Weeks    Status New    Target Date 11/04/20      PT LONG TERM GOAL #5   Title perform 5x sit to stand in < or = to 12 seconds to improve balance  Baseline --    Time 8    Period Weeks    Status New    Target Date 11/11/20                 Plan - 09/16/20 1045    Clinical Impression Statement Pt independent  and compliant with initial HEP. PT and pt worked on core activation and used press into foam roll to facilitate today.  Pt demonstrates improved lower abdominal activation and will work to incorporate into her exercises at home.  Pt reports a fall last night when she caught her foot on the floor.  PT discussed improving foot clearance and heel strike to prevent this in the future.  Pt required close supervision with standing exercise for safety and to provide verbal cues for technique.  Pt required tactile and verbal cueing to reduce scapular elevation with band exercises and foam press.  Pt will continue to benefit from skilled PT to address LBP and balance.    PT Frequency 2x / week    PT Duration 8 weeks    PT Treatment/Interventions ADLs/Self Care Home Management;Cryotherapy;Electrical Stimulation;Moist Heat;Traction;Stair training;Gait training;Therapeutic activities;Therapeutic exercise;Balance training;Neuromuscular re-education;Patient/family education;Manual techniques;Dry needling;Passive range of motion    PT Next Visit Plan Give standing rows & extensions for HEPnext session with emphasis on posture and core contraction.    PT Home Exercise Plan Access Code: [email or web address]    Consulted and Agree with Plan of Care Patient           Patient will benefit from skilled therapeutic intervention in order to improve the following deficits and impairments:  Abnormal gait,Decreased activity tolerance,Decreased strength,Impaired flexibility,Postural dysfunction,Pain,Decreased range of motion,Difficulty walking,Decreased endurance,Decreased balance  Visit Diagnosis: Chronic bilateral low back pain without sciatica  Other abnormalities of gait and mobility  Cramp and spasm  Muscle weakness (generalized)     Problem List Patient Active Problem List   Diagnosis Date Noted  . Primary osteoarthritis of right knee 06/02/2020  . Left groin hernia 05/22/2020  . Sleep disturbance 01/09/2020  . Acute  lower UTI   . Anxiety state   . Peripheral edema   . Leukocytosis   . Debility 12/13/2019  . Dyslipidemia   . Rectal bleeding   . Acute blood loss anemia   . Ischemic colitis ([place])   . Status post femoral-popliteal bypass surgery 12/09/2019  . Secondary hypercoagulable state ([place]) 12/03/2019  . Paroxysmal atrial fibrillation ([place]) 11/27/2019  . Chronic bilateral low back pain with right-sided sciatica 08/29/2019  . Chronic pain of right knee 08/29/2019  . Abnormality of gait 05/30/2019  . Neuropathic pain 05/30/2019  . Myalgia 08/08/2018  . Chronic pain syndrome 06/27/2018  . Gross hematuria 06/01/2018  . Obesity (BMI 30.0-34.9) 11/14/2017  . Failed back syndrome 09/28/2017  . CKD (chronic kidney disease), stage III ([place]) 08/07/2017  . Unilateral primary osteoarthritis, right knee 04/07/2016  . Acute gout of left foot 03/22/2016  . Pain in right ankle and joints of right foot 08/28/2014  . Trigeminal neuralgia   . Encounter for therapeutic drug monitoring 10/21/2013  . Syncope 12/29/2011  . Chronic low back pain 09/13/2010  . Allergic rhinitis due to pollen 06/25/2010  . COPD mixed type ([place]) 01/22/2010  . Solitary pulmonary nodule 01/12/2010  . Insulin resistance 01/06/2009  . Osteopenia 01/06/2009  . Eczema 11/26/2007  . Peripheral arterial disease ([place]) 06/29/2007  . Hyperlipidemia 10/23/2006  . Essential hypertension 10/23/2006  . ESOPHAGEAL STRICTURE 08/09/1993     Sigurd Sos, PT 09/16/20 11:10  AM  [facility] [address], [place], [name], [address] Phone: [phone]   Fax:  [phone]  Name: [name] [name] MRN: [id number] Date of Birth: [birth year]

## 2020-09-22 ENCOUNTER — Encounter: Payer: Medicare Other | Admitting: Physical Medicine and Rehabilitation

## 2020-09-23 ENCOUNTER — Encounter: Payer: Self-pay | Admitting: Physical Therapy

## 2020-09-23 ENCOUNTER — Other Ambulatory Visit: Payer: Self-pay

## 2020-09-23 ENCOUNTER — Ambulatory Visit: Payer: Medicare Other | Attending: Physical Medicine & Rehabilitation | Admitting: Physical Therapy

## 2020-09-23 DIAGNOSIS — M545 Low back pain, unspecified: Secondary | ICD-10-CM | POA: Diagnosis not present

## 2020-09-23 DIAGNOSIS — M6281 Muscle weakness (generalized): Secondary | ICD-10-CM | POA: Insufficient documentation

## 2020-09-23 DIAGNOSIS — R252 Cramp and spasm: Secondary | ICD-10-CM | POA: Insufficient documentation

## 2020-09-23 DIAGNOSIS — G8929 Other chronic pain: Secondary | ICD-10-CM | POA: Diagnosis not present

## 2020-09-23 DIAGNOSIS — R2689 Other abnormalities of gait and mobility: Secondary | ICD-10-CM | POA: Insufficient documentation

## 2020-09-23 NOTE — Therapy (Signed)
[facility] [address], [place], [name], [address] Phone: [phone]   Fax:  [phone]  Physical Therapy Treatment  Patient Details  Name: [name] [name] MRN: [id number] Date of Birth: 1924-07-29 Referring Provider (PT): Delice Lesch, MD   Encounter Date: 09/23/2020   PT End of Session - 09/23/20 1016    Visit Number 4    Date for PT Re-Evaluation 11/04/20    Authorization Type Medicare/BCBS    Progress Note Due on Visit 10    PT Start Time 1013    PT Stop Time 1054    PT Time Calculation (min) 41 min    Activity Tolerance Patient tolerated treatment well    Behavior During Therapy [facility] for tasks assessed/performed           Past Medical History:  Diagnosis Date  . Allergic rhinoconjunctivitis   . Allergy   . Anxiety    pt. managed- uses deep breathing   . Arthritis    low back , stenosis  . COLONIC POLYPS, RECURRENT 08/29/2006   2008 last colonoscopy. No further colonoscopy.     Marland Kitchen COPD (chronic obstructive pulmonary disease) ([place])   . Diverticulosis   . Dysrhythmia    afib  . Eczema   . Environmental allergies    allergy shot- q friday in Dr. Janee Morn office. PFT's abnormal- recommended Spiriva to use preop & will d/c after surgery  . GERD (gastroesophageal reflux disease)   . Hiatal hernia   . Hyperlipidemia   . Hypertension   . Lung nodule 2011  . Paroxysmal atrial fibrillation ([place]) 11/27/2019  . Peripheral vascular disease ([place])   . Shingles   . Stenosis of popliteal artery (HCC)    blood clots in legs long ago    . Trigeminal neuralgia    Left buttocks    Past Surgical History:  Procedure Laterality Date  . ABDOMINAL AORTAGRAM N/A 06/17/2011   Procedure: ABDOMINAL Maxcine Ham;  Surgeon: Elam Dutch, MD;  Location: [facility] CATH LAB;  Service: Cardiovascular;  Laterality: N/A;  . ABDOMINAL AORTOGRAM W/LOWER EXTREMITY Bilateral 06/22/2018   Procedure: ABDOMINAL AORTOGRAM W/LOWER EXTREMITY;   Surgeon: Elam Dutch, MD;  Location: [place] CV LAB;  Service: Cardiovascular;  Laterality: Bilateral;  . ABDOMINAL AORTOGRAM W/LOWER EXTREMITY Bilateral 11/29/2019   Procedure: ABDOMINAL AORTOGRAM W/LOWER EXTREMITY;  Surgeon: Elam Dutch, MD;  Location: [place] CV LAB;  Service: Cardiovascular;  Laterality: Bilateral;  . CATARACT EXTRACTION, BILATERAL     w IOL  . CHOLECYSTECTOMY  1998  . COLONOSCOPY    . ENDARTERECTOMY FEMORAL Left 12/09/2019   Procedure: ENDARTERECTOMY FEMORAL with bovine patch angioplasty.;  Surgeon: Elam Dutch, MD;  Location: [facility] OR;  Service: Vascular;  Laterality: Left;  . FEMORAL-POPLITEAL BYPASS GRAFT        x2 surgeries 1990's & 2009  . FEMORAL-POPLITEAL BYPASS GRAFT Right 12/09/2019   Procedure: REDO RIGHT FEMORAL-POPLITEAL ARTERY BYPASS GRAFT;  Surgeon: Elam Dutch, MD;  Location: [place];  Service: Vascular;  Laterality: Right;  . INJECTION KNEE Right Aug. 2016   Gel injection for pain  . LUMBAR FUSION  07/06/2011  . TONSILLECTOMY     as a teenager   . TUBAL LIGATION      There were no vitals filed for this visit.   Subjective Assessment - 09/23/20 1017    Subjective Low back hurt walking into clinic today, none sitting right now.    Pertinent History COPD, HTN, multiple vascular  surgeries in legs, chronic LBP    Currently in Pain? No/denies   Just sitting here now   Aggravating Factors  standing , first thing out of bed in teh AM.    Pain Relieving Factors Sitting                             [facility] Adult PT Treatment/Exercise - 09/23/20 0001      Self-Care   Self-Care Other Self-Care Comments    Other Self-Care Comments  Verbal education on use of her home TENS unit, when to use. Pt verbally understood concepts.      Lumbar Exercises: Aerobic   Nustep L2 x 10 min   PTA  present to discuss progress     Lumbar Exercises: Seated   Sit to Stand --   holding 3# wt 2x5   Other Seated Lumbar Exercises CPR  presses into pink ball 10x with Pilates breath    Other Seated Lumbar Exercises TA activaiton with Pilates breath      Lumbar Exercises: Supine   Ab Set 10 reps;5 seconds   VC to not hold breath   Clam --   uinlateral 6x, VC for breathing   Clam Limitations yellow loop with ab set 6x    Straight Leg Raise --   6x Bil VC for lower abs                   PT Short Term Goals - 09/16/20 1025      PT SHORT TERM GOAL #1   Title independent with initial HEP    Status On-going      PT SHORT TERM GOAL #2   Title improved upright posture in standing due to increased hip extension and increased glute strength    Status On-going             PT Long Term Goals - 09/09/20 1152      PT LONG TERM GOAL #1   Title independent with advanced HEP    Time 8    Period Weeks    Status New    Target Date 11/04/20      PT LONG TERM GOAL #2   Title improve FOTO to > or = to 49 to improve function    Baseline 43    Time 8    Period Weeks    Status New    Target Date 11/04/20      PT LONG TERM GOAL #3   Title improve LE endurance and strength to stand and walk for 10-15 minutes without significant limitation    Time 8    Period Weeks    Status New    Target Date 11/04/20      PT LONG TERM GOAL #4   Title report < or = to 6-7/10 LBP in the morning hours to improve function    Time 8    Period Weeks    Status New    Target Date 11/04/20      PT LONG TERM GOAL #5   Title perform 5x sit to stand in < or = to 12 seconds to improve balance    Baseline --    Time 8    Period Weeks    Status New    Target Date 11/11/20                 Plan - 09/23/20 1021    Clinical Impression  Statement Pt arrives with the usual back pain. Session spent mainly in supine to work on core/lumbopelvic strength and stability. PTA suggested to pt to work more on activating her lower abdominals more while supine or sitting. Pt is going to see if her home TENS still works and possibly use it  more for her standing activities.    Personal Factors and Comorbidities Comorbidity 3+    Comorbidities lumbar surgery, multiple leg surgeries and weakness, COPD, age, 2 body parts    Examination-Activity Limitations Locomotion Level;Stand;Stairs    Examination-Participation Restrictions Community Activity;Shop    Stability/Clinical Decision Making Evolving/Moderate complexity    Rehab Potential Good    PT Frequency 2x / week    PT Duration 8 weeks    PT Treatment/Interventions ADLs/Self Care Home Management;Cryotherapy;Electrical Stimulation;Moist Heat;Traction;Stair training;Gait training;Therapeutic activities;Therapeutic exercise;Balance training;Neuromuscular re-education;Patient/family education;Manual techniques;Dry needling;Passive range of motion    PT Next Visit Plan Did not give pt standing exercises today, consider next or continue to help pt build better core strength. See if pt had TENS unit at home.    PT Home Exercise Plan Access Code: [email or web address]    Consulted and Agree with Plan of Care Patient           Patient will benefit from skilled therapeutic intervention in order to improve the following deficits and impairments:  Abnormal gait,Decreased activity tolerance,Decreased strength,Impaired flexibility,Postural dysfunction,Pain,Decreased range of motion,Difficulty walking,Decreased endurance,Decreased balance  Visit Diagnosis: Chronic bilateral low back pain without sciatica  Other abnormalities of gait and mobility  Cramp and spasm  Muscle weakness (generalized)     Problem List Patient Active Problem List   Diagnosis Date Noted  . Primary osteoarthritis of right knee 06/02/2020  . Left groin hernia 05/22/2020  . Sleep disturbance 01/09/2020  . Acute lower UTI   . Anxiety state   . Peripheral edema   . Leukocytosis   . Debility 12/13/2019  . Dyslipidemia   . Rectal bleeding   . Acute blood loss anemia   . Ischemic colitis ([place])   . Status post  femoral-popliteal bypass surgery 12/09/2019  . Secondary hypercoagulable state ([place]) 12/03/2019  . Paroxysmal atrial fibrillation ([place]) 11/27/2019  . Chronic bilateral low back pain with right-sided sciatica 08/29/2019  . Chronic pain of right knee 08/29/2019  . Abnormality of gait 05/30/2019  . Neuropathic pain 05/30/2019  . Myalgia 08/08/2018  . Chronic pain syndrome 06/27/2018  . Gross hematuria 06/01/2018  . Obesity (BMI 30.0-34.9) 11/14/2017  . Failed back syndrome 09/28/2017  . CKD (chronic kidney disease), stage III ([place]) 08/07/2017  . Unilateral primary osteoarthritis, right knee 04/07/2016  . Acute gout of left foot 03/22/2016  . Pain in right ankle and joints of right foot 08/28/2014  . Trigeminal neuralgia   . Encounter for therapeutic drug monitoring 10/21/2013  . Syncope 12/29/2011  . Chronic low back pain 09/13/2010  . Allergic rhinitis due to pollen 06/25/2010  . COPD mixed type ([place]) 01/22/2010  . Solitary pulmonary nodule 01/12/2010  . Insulin resistance 01/06/2009  . Osteopenia 01/06/2009  . Eczema 11/26/2007  . Peripheral arterial disease ([place]) 06/29/2007  . Hyperlipidemia 10/23/2006  . Essential hypertension 10/23/2006  . ESOPHAGEAL STRICTURE 08/09/1993    [name] [name], PTA 09/23/2020, 11:01 AM  [facility] [address], [place], [name], [address] Phone: [phone]   Fax:  [phone]  Name: [name] [name] MRN: [id number] Date of Birth: 27-Mar-1925

## 2020-09-24 ENCOUNTER — Other Ambulatory Visit: Payer: Self-pay | Admitting: Physical Medicine & Rehabilitation

## 2020-09-28 ENCOUNTER — Other Ambulatory Visit: Payer: Self-pay

## 2020-09-28 ENCOUNTER — Telehealth: Payer: Self-pay

## 2020-09-28 ENCOUNTER — Ambulatory Visit: Payer: Medicare Other

## 2020-09-28 DIAGNOSIS — M6281 Muscle weakness (generalized): Secondary | ICD-10-CM

## 2020-09-28 DIAGNOSIS — M545 Low back pain, unspecified: Secondary | ICD-10-CM

## 2020-09-28 DIAGNOSIS — G8929 Other chronic pain: Secondary | ICD-10-CM

## 2020-09-28 DIAGNOSIS — R2689 Other abnormalities of gait and mobility: Secondary | ICD-10-CM

## 2020-09-28 DIAGNOSIS — R252 Cramp and spasm: Secondary | ICD-10-CM

## 2020-09-28 NOTE — Therapy (Signed)
[facility] [address], [place], [name], [address] Phone: [phone]   Fax:  [phone]  Physical Therapy Treatment  Patient Details  Name: [name] [name] MRN: [id number] Date of Birth: [birth year] Referring Provider (PT): Delice Lesch, MD   Encounter Date: 09/28/2020   PT End of Session - 09/28/20 1055    Visit Number 5    Date for PT Re-Evaluation 11/04/20    Authorization Type Medicare/BCBS    Progress Note Due on Visit 10    PT Start Time 1016    PT Stop Time 1059    PT Time Calculation (min) 43 min    Activity Tolerance Patient tolerated treatment well    Behavior During Therapy [facility] for tasks assessed/performed           Past Medical History:  Diagnosis Date  . Allergic rhinoconjunctivitis   . Allergy   . Anxiety    pt. managed- uses deep breathing   . Arthritis    low back , stenosis  . COLONIC POLYPS, RECURRENT 08/29/2006   2008 last colonoscopy. No further colonoscopy.     Marland Kitchen COPD (chronic obstructive pulmonary disease) ([place])   . Diverticulosis   . Dysrhythmia    afib  . Eczema   . Environmental allergies    allergy shot- q friday in Dr. Janee Morn office. PFT's abnormal- recommended Spiriva to use preop & will d/c after surgery  . GERD (gastroesophageal reflux disease)   . Hiatal hernia   . Hyperlipidemia   . Hypertension   . Lung nodule 2011  . Paroxysmal atrial fibrillation ([place]) 11/27/2019  . Peripheral vascular disease ([place])   . Shingles   . Stenosis of popliteal artery (HCC)    blood clots in legs long ago    . Trigeminal neuralgia    Left buttocks    Past Surgical History:  Procedure Laterality Date  . ABDOMINAL AORTAGRAM N/A 06/17/2011   Procedure: ABDOMINAL Maxcine Ham;  Surgeon: Elam Dutch, MD;  [facility] C[facility] LAB;  Service: Cardiovascular;  Laterality: N/A;  . ABDOMINAL AORTOGRAM W/LOWER EXTREMITY Bilateral 06/22/2018   Procedure: ABDOMINAL AORTOGRAM W/LOWER EXTREMITY;   Surgeon: Elam Dutch, MD;  Location: [place] CV LAB;  Service: Cardiovascular;  Laterality: Bilateral;  . ABDOMINAL AORTOGRAM W/LOWER EXTREMITY Bilateral 11/29/2019   Procedure: ABDOMINAL AORTOGRAM W/LOWER EXTREMITY;  Surgeon: Elam Dutch, MD;  Location: [place] CV LAB;  Service: Cardiovascular;  Laterality: Bilateral;  . CATARACT EXTRACTION, BILATERAL     w IOL  . CHOLECYSTECTOMY  1998  . COLONOSCOPY    . ENDARTERECTOMY FEMORAL Left 12/09/2019   Procedure: ENDARTERECTOMY FEMORAL with bovine patch angioplasty.;  Surgeon: Elam Dutch, MD;  Location: [facility] OR;  Service: Vascular;  Laterality: Left;  . FEMORAL-POPLITEAL BYPASS GRAFT        x2 surgeries 1990's & 2009  . FEMORAL-POPLITEAL BYPASS GRAFT Right 12/09/2019   Procedure: REDO RIGHT FEMORAL-POPLITEAL ARTERY BYPASS GRAFT;  Surgeon: Elam Dutch, MD;  Location: [place];  Service: Vascular;  Laterality: Right;  . INJECTION KNEE Right Aug. 2016   Gel injection for pain  . LUMBAR FUSION  07/06/2011  . TONSILLECTOMY     as a teenager   . TUBAL LIGATION      There were no vitals filed for this visit.   Subjective Assessment - 09/28/20 1013    Subjective My back is hurting today.  I usually feel better when i leave here.    Currently in Pain?  Yes    Pain Score 4     Pain Location Back    Pain Orientation Lower    Pain Descriptors / Indicators Constant    Pain Onset More than a month ago    Pain Frequency Constant    Aggravating Factors  standing, early morning    Pain Relieving Factors sitting                             [facility] Adult PT Treatment/Exercise - 09/28/20 0001      Lumbar Exercises: Aerobic   Nustep L2 x 10 min   PT  present to discuss progress     Lumbar Exercises: Standing   Heel Raises 20 reps      Knee/Hip Exercises: Standing   Rebounder weight shifting 3 ways x 1 min each.  Min use of UEs   on balance pad   Other Standing Knee Exercises alternating step taps on edge of  treadmill x20 with min UE support      Knee/Hip Exercises: Seated   Ball Squeeze 5" hold with core activation 5" hold x 10    Marching Strengthening;Both;2 sets;10 reps    Marching Limitations green band around legs    Sit to Sand 2 sets;5 reps;without UE support   holding 5# kettlebell                   PT Short Term Goals - 09/28/20 1017      PT SHORT TERM GOAL #1   Title independent with initial HEP    Status Achieved      PT SHORT TERM GOAL #2   Title improved upright posture in standing due to increased hip extension and increased glute strength    Status On-going      PT SHORT TERM GOAL #3   Title able to stand for 8-10 minutes due to increased LE strength and endurance    Status On-going             PT Long Term Goals - 09/09/20 1152      PT LONG TERM GOAL #1   Title independent with advanced HEP    Time 8    Period Weeks    Status New    Target Date 11/04/20      PT LONG TERM GOAL #2   Title improve FOTO to > or = to 49 to improve function    Baseline 43    Time 8    Period Weeks    Status New    Target Date 11/04/20      PT LONG TERM GOAL #3   Title improve LE endurance and strength to stand and walk for 10-15 minutes without significant limitation    Time 8    Period Weeks    Status New    Target Date 11/04/20      PT LONG TERM GOAL #4   Title report < or = to 6-7/10 LBP in the morning hours to improve function    Time 8    Period Weeks    Status New    Target Date 11/04/20      PT LONG TERM GOAL #5   Title perform 5x sit to stand in < or = to 12 seconds to improve balance    Baseline --    Time 8    Period Weeks    Status New    Target Date 11/11/20  Plan - 09/28/20 1033    Clinical Impression Statement Pt arrives with the usual back pain this morning and reports that pain is better after PT session. Pt did will with balance and standing exercise today and PT cued for abdominal activation with exercise  today.  Pt reports that she feels better overall since beginning PT due to increased activity.  PT provided stand by assistance and cueing for technique with exercise today.  Pt will continue to benefit from skilled PT to address chronic balance deficits and LBP.    PT Frequency 2x / week    PT Duration 8 weeks    PT Treatment/Interventions ADLs/Self Care Home Management;Cryotherapy;Electrical Stimulation;Moist Heat;Traction;Stair training;Gait training;Therapeutic activities;Therapeutic exercise;Balance training;Neuromuscular re-education;Patient/family education;Manual techniques;Dry needling;Passive range of motion    PT Next Visit Plan strength, balance, core activation    PT Home Exercise Plan Access Code: [email or web address]    Recommended Other Services initial cert is signed    Consulted and Agree with Plan of Care Patient           Patient will benefit from skilled therapeutic intervention in order to improve the following deficits and impairments:  Abnormal gait,Decreased activity tolerance,Decreased strength,Impaired flexibility,Postural dysfunction,Pain,Decreased range of motion,Difficulty walking,Decreased endurance,Decreased balance  Visit Diagnosis: Chronic bilateral low back pain without sciatica  Other abnormalities of gait and mobility  Cramp and spasm  Muscle weakness (generalized)     Problem List Patient Active Problem List   Diagnosis Date Noted  . Primary osteoarthritis of right knee 06/02/2020  . Left groin hernia 05/22/2020  . Sleep disturbance 01/09/2020  . Acute lower UTI   . Anxiety state   . Peripheral edema   . Leukocytosis   . Debility 12/13/2019  . Dyslipidemia   . Rectal bleeding   . Acute blood loss anemia   . Ischemic colitis ([place])   . Status post femoral-popliteal bypass surgery 12/09/2019  . Secondary hypercoagulable state ([place]) 12/03/2019  . Paroxysmal atrial fibrillation ([place]) 11/27/2019  . Chronic bilateral low back pain with right-sided  sciatica 08/29/2019  . Chronic pain of right knee 08/29/2019  . Abnormality of gait 05/30/2019  . Neuropathic pain 05/30/2019  . Myalgia 08/08/2018  . Chronic pain syndrome 06/27/2018  . Gross hematuria 06/01/2018  . Obesity (BMI 30.0-34.9) 11/14/2017  . Failed back syndrome 09/28/2017  . CKD (chronic kidney disease), stage III ([place]) 08/07/2017  . Unilateral primary osteoarthritis, right knee 04/07/2016  . Acute gout of left foot 03/22/2016  . Pain in right ankle and joints of right foot 08/28/2014  . Trigeminal neuralgia   . Encounter for therapeutic drug monitoring 10/21/2013  . Syncope 12/29/2011  . Chronic low back pain 09/13/2010  . Allergic rhinitis due to pollen 06/25/2010  . COPD mixed type ([place]) 01/22/2010  . Solitary pulmonary nodule 01/12/2010  . Insulin resistance 01/06/2009  . Osteopenia 01/06/2009  . Eczema 11/26/2007  . Peripheral arterial disease ([place]) 06/29/2007  . Hyperlipidemia 10/23/2006  . Essential hypertension 10/23/2006  . ESOPHAGEAL STRICTURE 08/09/1993    Sigurd Sos, PT 09/28/20 10:59 AM  [facility] [address], [place], [name], [address] Phone: [phone]   Fax:  [phone]  Name: LORISA SCHEID MRN: [id number] Date of Birth: 03/12/1925

## 2020-09-28 NOTE — Telephone Encounter (Signed)
Patient called she last received a gel injection 06/02/2020 patient wants to know if she can receive a cortisone injection now until she is able to get a gel injection in August call back:[phone]

## 2020-09-29 NOTE — Telephone Encounter (Signed)
Called patient no answer. LMOM.  Yes she can make appt to see Dr. [name] for cortisone inj.

## 2020-10-02 ENCOUNTER — Other Ambulatory Visit: Payer: Self-pay

## 2020-10-02 ENCOUNTER — Ambulatory Visit: Payer: Medicare Other | Admitting: Physical Therapy

## 2020-10-02 ENCOUNTER — Encounter: Payer: Self-pay | Admitting: Physical Therapy

## 2020-10-02 DIAGNOSIS — M6281 Muscle weakness (generalized): Secondary | ICD-10-CM | POA: Diagnosis not present

## 2020-10-02 DIAGNOSIS — R252 Cramp and spasm: Secondary | ICD-10-CM

## 2020-10-02 DIAGNOSIS — R2689 Other abnormalities of gait and mobility: Secondary | ICD-10-CM

## 2020-10-02 DIAGNOSIS — G8929 Other chronic pain: Secondary | ICD-10-CM

## 2020-10-02 DIAGNOSIS — M545 Low back pain, unspecified: Secondary | ICD-10-CM | POA: Diagnosis not present

## 2020-10-02 NOTE — Therapy (Signed)
[facility] [address] [place], [name], [address] Phone: [phone]   Fax:  [phone]  Physical Therapy Treatment  Patient Details  Name: [name] [name] MRN: [id number] Date of Birth: [birth year] Referring Provider (PT): Delice Lesch, MD   Encounter Date: 10/02/2020   PT End of Session - 10/02/20 1014     Visit Number 6    Date for PT Re-Evaluation 11/04/20    Authorization Type Medicare/BCBS    Progress Note Due on Visit 10    PT Start Time 1012    PT Stop Time 1055    PT Time Calculation (min) 43 min    Activity Tolerance Patient tolerated treatment well    Behavior During Therapy [facility] for tasks assessed/performed             Past Medical History:  Diagnosis Date   Allergic rhinoconjunctivitis    Allergy    Anxiety    pt. managed- uses deep breathing    Arthritis    low back , stenosis   COLONIC POLYPS, RECURRENT 08/29/2006   2008 last colonoscopy. No further colonoscopy.      COPD (chronic obstructive pulmonary disease) (HCC)    Diverticulosis    Dysrhythmia    afib   Eczema    Environmental allergies    allergy shot- q friday in Dr. Janee Morn office. PFT's abnormal- recommended Spiriva to use preop & will d/c after surgery   GERD (gastroesophageal reflux disease)    Hiatal hernia    Hyperlipidemia    Hypertension    Lung nodule 2011   Paroxysmal atrial fibrillation ([place]) 11/27/2019   Peripheral vascular disease (HCC)    Shingles    Stenosis of popliteal artery (HCC)    blood clots in legs long ago     Trigeminal neuralgia    Left buttocks    Past Surgical History:  Procedure Laterality Date   ABDOMINAL AORTAGRAM N/A 06/17/2011   Procedure: ABDOMINAL Maxcine Ham;  Surgeon: Elam Dutch, MD;  Location: [facility] C[facility] LAB;  Service: Cardiovascular;  Laterality: N/A;   ABDOMINAL AORTOGRAM W/LOWER EXTREMITY Bilateral 06/22/2018   Procedure: ABDOMINAL AORTOGRAM W/LOWER EXTREMITY;  Surgeon:  Elam Dutch, MD;  Location: [place] CV LAB;  Service: Cardiovascular;  Laterality: Bilateral;   ABDOMINAL AORTOGRAM W/LOWER EXTREMITY Bilateral 11/29/2019   Procedure: ABDOMINAL AORTOGRAM W/LOWER EXTREMITY;  Surgeon: Elam Dutch, MD;  Location: [place] CV LAB;  Service: Cardiovascular;  Laterality: Bilateral;   CATARACT EXTRACTION, BILATERAL     w IOL   CHOLECYSTECTOMY  1998   COLONOSCOPY     ENDARTERECTOMY FEMORAL Left 12/09/2019   Procedure: ENDARTERECTOMY FEMORAL with bovine patch angioplasty.;  Surgeon: Elam Dutch, MD;  Location: [place];  Service: Vascular;  Laterality: Left;   FEMORAL-POPLITEAL BYPASS GRAFT        x2 surgeries 1990's & 2009   FEMORAL-POPLITEAL BYPASS GRAFT Right 12/09/2019   Procedure: REDO RIGHT FEMORAL-POPLITEAL ARTERY BYPASS GRAFT;  Surgeon: Elam Dutch, MD;  Location: [place];  Service: Vascular;  Laterality: Right;   INJECTION KNEE Right Aug. 2016   Gel injection for pain   LUMBAR FUSION  07/06/2011   TONSILLECTOMY     as a teenager    TUBAL LIGATION      There were no vitals filed for this visit.   Subjective Assessment - 10/02/20 1016     Subjective Not sure exactly why but today my back feels pretty good this AM. Planning on getting  a cortisone shot in my knee soon.    Pertinent History COPD, HTN, multiple vascular surgeries in legs, chronic LBP    Currently in Pain? Yes    Pain Score 3     Pain Location Back    Pain Orientation Lower    Pain Descriptors / Indicators Dull;Sore    Multiple Pain Sites No                               [facility] Adult PT Treatment/Exercise - 10/02/20 0001       Lumbar Exercises: Stretches   Active Hamstring Stretch Left;Right;3 reps;10 seconds      Lumbar Exercises: Aerobic   Nustep L2 x 10 min   PTA  present to discuss progress, MHP to low back     Lumbar Exercises: Standing   Heel Raises 20 reps    Row Strengthening;Both;Theraband;20 reps    Theraband Level (Row)  Level 2 (Red)    Shoulder Extension Strengthening;Both;Theraband;20 reps    Theraband Level (Shoulder Extension) Level 2 (Red)      Lumbar Exercises: Seated   Long Arc Quad on Chair Strengthening;AROM;Both;1 set;10 reps    Other Seated Lumbar Exercises CPR presses into pink ball 10x with Pilates breath      Lumbar Exercises: Supine   Clam Limitations yellow loop with ab set 10x      Knee/Hip Exercises: Standing   Rebounder weight shifting 3 ways x 1 min each.  Min use of UEs   on balance pad     Knee/Hip Exercises: Seated   Ball Squeeze 5" hold with core activation 5" hold x 10    Sit to Sand 2 sets;5 reps;without UE support   holding 5# kettlebell                     PT Short Term Goals - 09/28/20 1017       PT SHORT TERM GOAL #1   Title independent with initial HEP    Status Achieved      PT SHORT TERM GOAL #2   Title improved upright posture in standing due to increased hip extension and increased glute strength    Status On-going      PT SHORT TERM GOAL #3   Title able to stand for 8-10 minutes due to increased LE strength and endurance    Status On-going               PT Long Term Goals - 09/09/20 1152       PT LONG TERM GOAL #1   Title independent with advanced HEP    Time 8    Period Weeks    Status New    Target Date 11/04/20      PT LONG TERM GOAL #2   Title improve FOTO to > or = to 49 to improve function    Baseline 43    Time 8    Period Weeks    Status New    Target Date 11/04/20      PT LONG TERM GOAL #3   Title improve LE endurance and strength to stand and walk for 10-15 minutes without significant limitation    Time 8    Period Weeks    Status New    Target Date 11/04/20      PT LONG TERM GOAL #4   Title report < or = to 6-7/10 LBP in the  morning hours to improve function    Time 8    Period Weeks    Status New    Target Date 11/04/20      PT LONG TERM GOAL #5   Title perform 5x sit to stand in < or = to 12  seconds to improve balance    Baseline --    Time 8    Period Weeks    Status New    Target Date 11/11/20                   Plan - 10/02/20 1015     Clinical Impression Statement Pt arrives with reports of "feeling not too bad in my back." Pt is unsure why, perhaps less humidity. Pt performed all theraputic exercises painfree or at least no increased pain. Pt is going to have RT knee injection soon with Dr [name]. We were careful to monitor any knee issues.    Personal Factors and Comorbidities Comorbidity 3+    Comorbidities lumbar surgery, multiple leg surgeries and weakness, COPD, age, 2 body parts    Examination-Activity Limitations Locomotion Level;Stand;Stairs    Examination-Participation Restrictions Community Activity;Shop    Stability/Clinical Decision Making Evolving/Moderate complexity    Rehab Potential Good    PT Frequency 2x / week    PT Duration 8 weeks    PT Treatment/Interventions ADLs/Self Care Home Management;Cryotherapy;Electrical Stimulation;Moist Heat;Traction;Stair training;Gait training;Therapeutic activities;Therapeutic exercise;Balance training;Neuromuscular re-education;Patient/family education;Manual techniques;Dry needling;Passive range of motion    PT Next Visit Plan strength, balance, core activation    PT Home Exercise Plan Access Code: [email or web address]    Consulted and Agree with Plan of Care Patient             Patient will benefit from skilled therapeutic intervention in order to improve the following deficits and impairments:  Abnormal gait, Decreased activity tolerance, Decreased strength, Impaired flexibility, Postural dysfunction, Pain, Decreased range of motion, Difficulty walking, Decreased endurance, Decreased balance  Visit Diagnosis: Chronic bilateral low back pain without sciatica  Other abnormalities of gait and mobility  Cramp and spasm  Muscle weakness (generalized)     Problem List Patient Active Problem List   Diagnosis  Date Noted   Primary osteoarthritis of right knee 06/02/2020   Left groin hernia 05/22/2020   Sleep disturbance 01/09/2020   Acute lower UTI    Anxiety state    Peripheral edema    Leukocytosis    Debility 12/13/2019   Dyslipidemia    Rectal bleeding    Acute blood loss anemia    Ischemic colitis (HCC)    Status post femoral-popliteal bypass surgery 12/09/2019   Secondary hypercoagulable state ([place]) 12/03/2019   Paroxysmal atrial fibrillation (HCC) 11/27/2019   Chronic bilateral low back pain with right-sided sciatica 08/29/2019   Chronic pain of right knee 08/29/2019   Abnormality of gait 05/30/2019   Neuropathic pain 05/30/2019   Myalgia 08/08/2018   Chronic pain syndrome 06/27/2018   Gross hematuria 06/01/2018   Obesity (BMI 30.0-34.9) 11/14/2017   Failed back syndrome 09/28/2017   CKD (chronic kidney disease), stage III ([place]) 08/07/2017   Unilateral primary osteoarthritis, right knee 04/07/2016   Acute gout of left foot 03/22/2016   Pain in right ankle and joints of right foot 08/28/2014   Trigeminal neuralgia    Encounter for therapeutic drug monitoring 10/21/2013   Syncope 12/29/2011   Chronic low back pain 09/13/2010   Allergic rhinitis due to pollen 06/25/2010   COPD mixed type ([place]) 01/22/2010   Solitary  pulmonary nodule 01/12/2010   Insulin resistance 01/06/2009   Osteopenia 01/06/2009   Eczema 11/26/2007   Peripheral arterial disease ([place]) 06/29/2007   Hyperlipidemia 10/23/2006   Essential hypertension 10/23/2006   ESOPHAGEAL STRICTURE 08/09/1993    Kamilah Correia, PTA 10/02/2020, 10:55 AM  [facility] [address], [place], [name], [address] Phone: [phone]   Fax:  [phone]  Name: [name] [name] MRN: [id number] Date of Birth: May 14, 1924

## 2020-10-05 ENCOUNTER — Encounter: Payer: Self-pay | Admitting: Physical Therapy

## 2020-10-05 ENCOUNTER — Ambulatory Visit: Payer: Medicare Other | Admitting: Physical Therapy

## 2020-10-05 ENCOUNTER — Other Ambulatory Visit: Payer: Self-pay

## 2020-10-05 DIAGNOSIS — G8929 Other chronic pain: Secondary | ICD-10-CM | POA: Diagnosis not present

## 2020-10-05 DIAGNOSIS — M6281 Muscle weakness (generalized): Secondary | ICD-10-CM | POA: Diagnosis not present

## 2020-10-05 DIAGNOSIS — M545 Low back pain, unspecified: Secondary | ICD-10-CM

## 2020-10-05 DIAGNOSIS — R2689 Other abnormalities of gait and mobility: Secondary | ICD-10-CM | POA: Diagnosis not present

## 2020-10-05 DIAGNOSIS — R252 Cramp and spasm: Secondary | ICD-10-CM | POA: Diagnosis not present

## 2020-10-05 NOTE — Therapy (Signed)
[facility] [address], [place], [name], [address] Phone: [phone]   Fax:  [phone]  Physical Therapy Treatment  Patient Details  Name: [name] [name] MRN: [id number] Date of Birth: 09-23-1924 Referring Provider (PT): Delice Lesch, MD   Encounter Date: 10/05/2020   PT End of Session - 10/05/20 1012     Visit Number 7    Date for PT Re-Evaluation 11/04/20    Authorization Type Medicare/BCBS    Progress Note Due on Visit 10    PT Start Time 1012    PT Stop Time 1100    PT Time Calculation (min) 48 min    Activity Tolerance Patient tolerated treatment well    Behavior During Therapy [facility] for tasks assessed/performed             Past Medical History:  Diagnosis Date   Allergic rhinoconjunctivitis    Allergy    Anxiety    pt. managed- uses deep breathing    Arthritis    low back , stenosis   COLONIC POLYPS, RECURRENT 08/29/2006   2008 last colonoscopy. No further colonoscopy.      COPD (chronic obstructive pulmonary disease) (HCC)    Diverticulosis    Dysrhythmia    afib   Eczema    Environmental allergies    allergy shot- q friday in Dr. Janee Morn office. PFT's abnormal- recommended Spiriva to use preop & will d/c after surgery   GERD (gastroesophageal reflux disease)    Hiatal hernia    Hyperlipidemia    Hypertension    Lung nodule 2011   Paroxysmal atrial fibrillation ([place]) 11/27/2019   Peripheral vascular disease (HCC)    Shingles    Stenosis of popliteal artery (HCC)    blood clots in legs long ago     Trigeminal neuralgia    Left buttocks    Past Surgical History:  Procedure Laterality Date   ABDOMINAL AORTAGRAM N/A 06/17/2011   Procedure: ABDOMINAL Maxcine Ham;  Surgeon: Elam Dutch, MD;  Location: [facility] CATH LAB;  Service: Cardiovascular;  Laterality: N/A;   ABDOMINAL AORTOGRAM W/LOWER EXTREMITY Bilateral 06/22/2018   Procedure: ABDOMINAL AORTOGRAM W/LOWER EXTREMITY;  Surgeon:  Elam Dutch, MD;  Location: [place] CV LAB;  Service: Cardiovascular;  Laterality: Bilateral;   ABDOMINAL AORTOGRAM W/LOWER EXTREMITY Bilateral 11/29/2019   Procedure: ABDOMINAL AORTOGRAM W/LOWER EXTREMITY;  Surgeon: Elam Dutch, MD;  Location: [place] CV LAB;  Service: Cardiovascular;  Laterality: Bilateral;   CATARACT EXTRACTION, BILATERAL     w IOL   CHOLECYSTECTOMY  1998   COLONOSCOPY     ENDARTERECTOMY FEMORAL Left 12/09/2019   Procedure: ENDARTERECTOMY FEMORAL with bovine patch angioplasty.;  Surgeon: Elam Dutch, MD;  Location: [place];  Service: Vascular;  Laterality: Left;   FEMORAL-POPLITEAL BYPASS GRAFT        x2 surgeries 1990's & 2009   FEMORAL-POPLITEAL BYPASS GRAFT Right 12/09/2019   Procedure: REDO RIGHT FEMORAL-POPLITEAL ARTERY BYPASS GRAFT;  Surgeon: Elam Dutch, MD;  Location: [place];  Service: Vascular;  Laterality: Right;   INJECTION KNEE Right Aug. 2016   Gel injection for pain   LUMBAR FUSION  07/06/2011   TONSILLECTOMY     as a teenager    TUBAL LIGATION      There were no vitals filed for this visit.   Subjective Assessment - 10/05/20 1013     Subjective I had a good weekend but i woke up this AM with a lot of back  pain. I did my stretches  and took 2 Tylenol. That was helpful.    Pertinent History COPD, HTN, multiple vascular surgeries in legs, chronic LBP    Currently in Pain? Yes    Pain Score 4     Pain Location Back    Pain Orientation Lower    Pain Descriptors / Indicators Sore    Aggravating Factors  upon waking    Pain Relieving Factors Sitting down    Multiple Pain Sites No                               [facility] Adult PT Treatment/Exercise - 10/05/20 0001       Lumbar Exercises: Stretches   Active Hamstring Stretch Left;Right;10 seconds;2 reps    Pelvic Tilt 10 reps      Lumbar Exercises: Aerobic   Nustep L2 x 10 min   PTA  present to discuss progress, MHP to low back     Lumbar Exercises:  Seated   Long Arc Quad on Chair Strengthening;AROM;Both;1 set;10 reps    Other Seated Lumbar Exercises Core activation in sitting /review per pt request    Other Seated Lumbar Exercises Sit fit pelvic rocks 10x in each direction.TC to keep pt from moving trunk > pelvis                    PT Education - 10/05/20 1038     Education Details Review of HEP per pt request    Person(s) Educated Patient    Methods Explanation;Demonstration    Comprehension Verbalized understanding;Returned demonstration              PT Short Term Goals - 09/28/20 1017       PT SHORT TERM GOAL #1   Title independent with initial HEP    Status Achieved      PT SHORT TERM GOAL #2   Title improved upright posture in standing due to increased hip extension and increased glute strength    Status On-going      PT SHORT TERM GOAL #3   Title able to stand for 8-10 minutes due to increased LE strength and endurance    Status On-going               PT Long Term Goals - 09/09/20 1152       PT LONG TERM GOAL #1   Title independent with advanced HEP    Time 8    Period Weeks    Status New    Target Date 11/04/20      PT LONG TERM GOAL #2   Title improve FOTO to > or = to 49 to improve function    Baseline 43    Time 8    Period Weeks    Status New    Target Date 11/04/20      PT LONG TERM GOAL #3   Title improve LE endurance and strength to stand and walk for 10-15 minutes without significant limitation    Time 8    Period Weeks    Status New    Target Date 11/04/20      PT LONG TERM GOAL #4   Title report < or = to 6-7/10 LBP in the morning hours to improve function    Time 8    Period Weeks    Status New    Target Date 11/04/20  PT LONG TERM GOAL #5   Title perform 5x sit to stand in < or = to 12 seconds to improve balance    Baseline --    Time 8    Period Weeks    Status New    Target Date 11/11/20                   Plan - 10/05/20 1013      Clinical Impression Statement Pt arrives with a tick higher back pain than last week. She reports having a good weekend but had a lot of back pain upon waking this AM. Pt typically can decrease her pain with exercises and Tylenol. Pt requested a review of her HEP to make sure she was doing everything she shoulde be doing. Pt demonstrates decreased ability to move her pelvis independent of trunk.    Personal Factors and Comorbidities Comorbidity 3+    Comorbidities lumbar surgery, multiple leg surgeries and weakness, COPD, age, 2 body parts    Examination-Activity Limitations Locomotion Level;Stand;Stairs    Examination-Participation Restrictions Community Activity;Shop    Stability/Clinical Decision Making Evolving/Moderate complexity    Rehab Potential Good    PT Frequency 2x / week    PT Duration 8 weeks    PT Treatment/Interventions ADLs/Self Care Home Management;Cryotherapy;Electrical Stimulation;Moist Heat;Traction;Stair training;Gait training;Therapeutic activities;Therapeutic exercise;Balance training;Neuromuscular re-education;Patient/family education;Manual techniques;Dry needling;Passive range of motion    PT Next Visit Plan strength, balance, core activation    PT Home Exercise Plan Access Code: [email or web address]    Consulted and Agree with Plan of Care Patient             Patient will benefit from skilled therapeutic intervention in order to improve the following deficits and impairments:  Abnormal gait, Decreased activity tolerance, Decreased strength, Impaired flexibility, Postural dysfunction, Pain, Decreased range of motion, Difficulty walking, Decreased endurance, Decreased balance  Visit Diagnosis: Chronic bilateral low back pain without sciatica  Other abnormalities of gait and mobility  Cramp and spasm  Muscle weakness (generalized)     Problem List Patient Active Problem List   Diagnosis Date Noted   Primary osteoarthritis of right knee 06/02/2020   Left groin hernia  05/22/2020   Sleep disturbance 01/09/2020   Acute lower UTI    Anxiety state    Peripheral edema    Leukocytosis    Debility 12/13/2019   Dyslipidemia    Rectal bleeding    Acute blood loss anemia    Ischemic colitis (HCC)    Status post femoral-popliteal bypass surgery 12/09/2019   Secondary hypercoagulable state ([place]) 12/03/2019   Paroxysmal atrial fibrillation (HCC) 11/27/2019   Chronic bilateral low back pain with right-sided sciatica 08/29/2019   Chronic pain of right knee 08/29/2019   Abnormality of gait 05/30/2019   Neuropathic pain 05/30/2019   Myalgia 08/08/2018   Chronic pain syndrome 06/27/2018   Gross hematuria 06/01/2018   Obesity (BMI 30.0-34.9) 11/14/2017   Failed back syndrome 09/28/2017   CKD (chronic kidney disease), stage III ([place]) 08/07/2017   Unilateral primary osteoarthritis, right knee 04/07/2016   Acute gout of left foot 03/22/2016   Pain in right ankle and joints of right foot 08/28/2014   Trigeminal neuralgia    Encounter for therapeutic drug monitoring 10/21/2013   Syncope 12/29/2011   Chronic low back pain 09/13/2010   Allergic rhinitis due to pollen 06/25/2010   COPD mixed type ([place]) 01/22/2010   Solitary pulmonary nodule 01/12/2010   Insulin resistance 01/06/2009   Osteopenia 01/06/2009   Eczema  11/26/2007   Peripheral arterial disease ([place]) 06/29/2007   Hyperlipidemia 10/23/2006   Essential hypertension 10/23/2006   ESOPHAGEAL STRICTURE 08/09/1993    Egon Dittus, PTA 10/05/2020, 10:57 AM  [facility] [address], [place], [name], [address] Phone: [phone]   Fax:  [phone]  Name: [name] [name] MRN: [id number] Date of Birth: 1925-02-06

## 2020-10-07 ENCOUNTER — Ambulatory Visit: Payer: Medicare Other | Admitting: Physical Therapy

## 2020-10-07 ENCOUNTER — Other Ambulatory Visit: Payer: Self-pay

## 2020-10-07 ENCOUNTER — Encounter: Payer: Self-pay | Admitting: Physical Therapy

## 2020-10-07 DIAGNOSIS — R252 Cramp and spasm: Secondary | ICD-10-CM | POA: Diagnosis not present

## 2020-10-07 DIAGNOSIS — G8929 Other chronic pain: Secondary | ICD-10-CM | POA: Diagnosis not present

## 2020-10-07 DIAGNOSIS — M545 Low back pain, unspecified: Secondary | ICD-10-CM | POA: Diagnosis not present

## 2020-10-07 DIAGNOSIS — R2689 Other abnormalities of gait and mobility: Secondary | ICD-10-CM

## 2020-10-07 DIAGNOSIS — M6281 Muscle weakness (generalized): Secondary | ICD-10-CM | POA: Diagnosis not present

## 2020-10-07 NOTE — Therapy (Signed)
[facility] [address] [place], [name], [address] Phone: [phone]   Fax:  [phone]  Physical Therapy Treatment  Patient Details  Name: [name] [name] MRN: [id number] Date of Birth: [birth year] Referring Provider (PT): Delice Lesch, MD   Encounter Date: 10/07/2020   PT End of Session - 10/07/20 1014     Visit Number 8    Date for PT Re-Evaluation 11/04/20    Authorization Type Medicare/BCBS    PT Start Time 1014    PT Stop Time 1053    PT Time Calculation (min) 39 min    Activity Tolerance Patient tolerated treatment well    Behavior During Therapy [facility] for tasks assessed/performed             Past Medical History:  Diagnosis Date   Allergic rhinoconjunctivitis    Allergy    Anxiety    pt. managed- uses deep breathing    Arthritis    low back , stenosis   COLONIC POLYPS, RECURRENT 08/29/2006   2008 last colonoscopy. No further colonoscopy.      COPD (chronic obstructive pulmonary disease) (HCC)    Diverticulosis    Dysrhythmia    afib   Eczema    Environmental allergies    allergy shot- q friday in Dr. Janee Morn office. PFT's abnormal- recommended Spiriva to use preop & will d/c after surgery   GERD (gastroesophageal reflux disease)    Hiatal hernia    Hyperlipidemia    Hypertension    Lung nodule 2011   Paroxysmal atrial fibrillation ([place]) 11/27/2019   Peripheral vascular disease (HCC)    Shingles    Stenosis of popliteal artery (HCC)    blood clots in legs long ago     Trigeminal neuralgia    Left buttocks    Past Surgical History:  Procedure Laterality Date   ABDOMINAL AORTAGRAM N/A 06/17/2011   Procedure: ABDOMINAL Maxcine Ham;  Surgeon: Elam Dutch, MD;  Location: [facility] CATH LAB;  Service: Cardiovascular;  Laterality: N/A;   ABDOMINAL AORTOGRAM W/LOWER EXTREMITY Bilateral 06/22/2018   Procedure: ABDOMINAL AORTOGRAM W/LOWER EXTREMITY;  Surgeon: Elam Dutch, MD;  Location: [place] CV LAB;  Service: Cardiovascular;  Laterality: Bilateral;   ABDOMINAL AORTOGRAM W/LOWER EXTREMITY Bilateral 11/29/2019   Procedure: ABDOMINAL AORTOGRAM W/LOWER EXTREMITY;  Surgeon: Elam Dutch, MD;  Location: [place] CV LAB;  Service: Cardiovascular;  Laterality: Bilateral;   CATARACT EXTRACTION, BILATERAL     w IOL   CHOLECYSTECTOMY  1998   COLONOSCOPY     ENDARTERECTOMY FEMORAL Left 12/09/2019   Procedure: ENDARTERECTOMY FEMORAL with bovine patch angioplasty.;  Surgeon: Elam Dutch, MD;  Location: [place];  Service: Vascular;  Laterality: Left;   FEMORAL-POPLITEAL BYPASS GRAFT        x2 surgeries 1990's & 2009   FEMORAL-POPLITEAL BYPASS GRAFT Right 12/09/2019   Procedure: REDO RIGHT FEMORAL-POPLITEAL ARTERY BYPASS GRAFT;  Surgeon: Elam Dutch, MD;  Location: Home;  Service: Vascular;  Laterality: Right;   INJECTION KNEE Right Aug. 2016   Gel injection for pain   LUMBAR FUSION  07/06/2011   TONSILLECTOMY     as a teenager    TUBAL LIGATION      There were no vitals filed for this visit.   Subjective Assessment - 10/07/20 1019     Subjective "I'm doing alright this AM."    Pertinent History COPD, HTN, multiple vascular surgeries in legs, chronic LBP    Patient Stated Goals  get legs stronger, walk longer, reduce LBP    Currently in Pain? Yes    Pain Score 3     Pain Location Back    Pain Orientation Lower    Pain Descriptors / Indicators Sore    Multiple Pain Sites No                               [facility] Adult PT Treatment/Exercise - 10/07/20 0001       Lumbar Exercises: Stretches   Pelvic Tilt 10 reps   Takes pt a few to get her pelvis moving ( a little )     Lumbar Exercises: Aerobic   Nustep L2 x 10 min   PTA  present to discuss progress, MHP to low back     Lumbar Exercises: Seated   Long Arc Quad on Chair Strengthening;Both;1 set;10 reps;Weights    LAQ on Chair Weights (lbs) 2    Other Seated Lumbar Exercises Sit fit  pelvic rocks 20x2  in each direction.TC to keep pt from moving trunk > pelvis   Added pelvic circles 10x each direction: feet on black square     Knee/Hip Exercises: Standing   Rebounder weight shifting 3 ways x 1 min each.  Min use of UEs   on balance pad     Knee/Hip Exercises: Seated   Sit to Sand 2 sets;without UE support;10 reps   holding 5# kettlebell                     PT Short Term Goals - 10/07/20 1057       PT SHORT TERM GOAL #2   Title improved upright posture in standing due to increased hip extension and increased glute strength    Time 4    Period Weeks    Status Achieved    Target Date 10/07/20               PT Long Term Goals - 09/09/20 1152       PT LONG TERM GOAL #1   Title independent with advanced HEP    Time 8    Period Weeks    Status New    Target Date 11/04/20      PT LONG TERM GOAL #2   Title improve FOTO to > or = to 49 to improve function    Baseline 43    Time 8    Period Weeks    Status New    Target Date 11/04/20      PT LONG TERM GOAL #3   Title improve LE endurance and strength to stand and walk for 10-15 minutes without significant limitation    Time 8    Period Weeks    Status New    Target Date 11/04/20      PT LONG TERM GOAL #4   Title report < or = to 6-7/10 LBP in the morning hours to improve function    Time 8    Period Weeks    Status New    Target Date 11/04/20      PT LONG TERM GOAL #5   Title perform 5x sit to stand in < or = to 12 seconds to improve balance    Baseline --    Time 8    Period Weeks    Status New    Target Date 11/11/20  Plan - 10/07/20 1020     Clinical Impression Statement Pt arrives with less LBP than on previous session. Pt feels performing pelvic movements on the sit fit helps loosen up her back. Pt did not report any increases in her back pain during her exercises, both seated and standing.    Personal Factors and Comorbidities Comorbidity 3+     Comorbidities lumbar surgery, multiple leg surgeries and weakness, COPD, age, 2 body parts    Examination-Activity Limitations Locomotion Level;Stand;Stairs    Examination-Participation Restrictions Community Activity;Shop    Stability/Clinical Decision Making Evolving/Moderate complexity    Rehab Potential Good    PT Frequency 2x / week    PT Duration 8 weeks    PT Treatment/Interventions ADLs/Self Care Home Management;Cryotherapy;Electrical Stimulation;Moist Heat;Traction;Stair training;Gait training;Therapeutic activities;Therapeutic exercise;Balance training;Neuromuscular re-education;Patient/family education;Manual techniques;Dry needling;Passive range of motion    PT Next Visit Plan strength, balance, core activation, next 1-2 visits 5x STS and review LTGs ( walking etc...)    PT Home Exercise Plan Access Code: [email or web address]    Consulted and Agree with Plan of Care Patient             Patient will benefit from skilled therapeutic intervention in order to improve the following deficits and impairments:  Abnormal gait, Decreased activity tolerance, Decreased strength, Impaired flexibility, Postural dysfunction, Pain, Decreased range of motion, Difficulty walking, Decreased endurance, Decreased balance  Visit Diagnosis: Chronic bilateral low back pain without sciatica  Other abnormalities of gait and mobility  Cramp and spasm  Muscle weakness (generalized)     Problem List Patient Active Problem List   Diagnosis Date Noted   Primary osteoarthritis of right knee 06/02/2020   Left groin hernia 05/22/2020   Sleep disturbance 01/09/2020   Acute lower UTI    Anxiety state    Peripheral edema    Leukocytosis    Debility 12/13/2019   Dyslipidemia    Rectal bleeding    Acute blood loss anemia    Ischemic colitis ([place])    Status post femoral-popliteal bypass surgery 12/09/2019   Secondary hypercoagulable state ([place]) 12/03/2019   Paroxysmal atrial fibrillation (HCC) 11/27/2019    Chronic bilateral low back pain with right-sided sciatica 08/29/2019   Chronic pain of right knee 08/29/2019   Abnormality of gait 05/30/2019   Neuropathic pain 05/30/2019   Myalgia 08/08/2018   Chronic pain syndrome 06/27/2018   Gross hematuria 06/01/2018   Obesity (BMI 30.0-34.9) 11/14/2017   Failed back syndrome 09/28/2017   CKD (chronic kidney disease), stage III ([place]) 08/07/2017   Unilateral primary osteoarthritis, right knee 04/07/2016   Acute gout of left foot 03/22/2016   Pain in right ankle and joints of right foot 08/28/2014   Trigeminal neuralgia    Encounter for therapeutic drug monitoring 10/21/2013   Syncope 12/29/2011   Chronic low back pain 09/13/2010   Allergic rhinitis due to pollen 06/25/2010   COPD mixed type ([place]) 01/22/2010   Solitary pulmonary nodule 01/12/2010   Insulin resistance 01/06/2009   Osteopenia 01/06/2009   Eczema 11/26/2007   Peripheral arterial disease ([place]) 06/29/2007   Hyperlipidemia 10/23/2006   Essential hypertension 10/23/2006   ESOPHAGEAL STRICTURE 08/09/1993    Lilliam Chamblee, PTA 10/07/2020, 11:01 AM  [facility] [address], [place], [name], [address] Phone: [phone]   Fax:  [phone]  Name: [name] [name] MRN: [id number] Date of Birth: 07-05-1924

## 2020-10-08 ENCOUNTER — Ambulatory Visit (INDEPENDENT_AMBULATORY_CARE_PROVIDER_SITE_OTHER): Payer: Medicare Other | Admitting: Orthopaedic Surgery

## 2020-10-08 ENCOUNTER — Encounter: Payer: Self-pay | Admitting: Orthopaedic Surgery

## 2020-10-08 DIAGNOSIS — M1711 Unilateral primary osteoarthritis, right knee: Secondary | ICD-10-CM

## 2020-10-08 MED ORDER — LIDOCAINE HCL 1 % IJ SOLN
2.0000 mL | INTRAMUSCULAR | Status: AC | PRN
  Administered 2020-10-08: 2 mL

## 2020-10-08 MED ORDER — BUPIVACAINE HCL 0.5 % IJ SOLN
2.0000 mL | INTRAMUSCULAR | Status: AC | PRN
  Administered 2020-10-08: 2 mL via INTRA_ARTICULAR

## 2020-10-08 MED ORDER — METHYLPREDNISOLONE ACETATE 40 MG/ML IJ SUSP
40.0000 mg | INTRAMUSCULAR | Status: AC | PRN
  Administered 2020-10-08: 40 mg via INTRA_ARTICULAR

## 2020-10-08 NOTE — Progress Notes (Signed)
Office Visit Note   Patient: [name] [name]           Date of Birth: 1924/08/22           MRN: [id number] Visit Date: 10/08/2020              Requested by: Marin Olp, MD [place],  [place] [address] PCP: Marin Olp, MD   Assessment & Plan: Visit Diagnoses:  1. Primary osteoarthritis of right knee     Plan: Impression is right knee degenerative joint disease.  Today, we discussed various treatment options to include intra-articular cortisone injection for which she would like to proceed.  She would like to try another course of viscosupplementation in the future, but it is too early to proceed at this point.  Follow-up with [place] as needed.  Follow-Up Instructions: Return if symptoms worsen or fail to improve.   Orders:  No orders of the defined types were placed in this encounter.  No orders of the defined types were placed in this encounter.     Procedures: Large Joint Inj: R knee on 10/08/2020 4:16 PM Indications: pain Details: 22 G needle  Arthrogram: No  Medications: 40 mg methylPREDNISolone acetate 40 MG/ML; 2 mL lidocaine 1 %; 2 mL bupivacaine 0.5 % Consent was given by the patient. Patient was prepped and draped in the usual sterile fashion.      Clinical Data: No additional findings.   Subjective: Chief Complaint  Patient presents with   Right Knee - Pain    HPI patient is a pleasant 87 year old female who comes in today with recurrent right knee pain.  History of advanced degenerative joint disease to the right knee.  We have been seeing her for this where cortisone and viscosupplementation injections have been performed in the past.  She underwent right knee viscosupplementation injection in February which did seem to help until this month.  Her pain has returned and is worsened.  The pain is throughout the entire knee and is worse with walking as well as at night when she is trying to sleep.  She is requesting a cortisone  injection today if possible.  Review of Systems as detailed in HPI.  All others reviewed and are negative.   Objective: Vital Signs: There were no vitals taken for this visit.  Physical Exam well-developed well-nourished female in no acute distress.  Alert and oriented x3.  Ortho Exam right knee exam shows trace effusion.  Range of motion 0 to 110 degrees.  Mild medial joint line tenderness.  Ligaments are stable.  She is neurovascular intact distally.  Specialty Comments:  No specialty comments available.  Imaging: No new imaging   PMFS History: Patient Active Problem List   Diagnosis Date Noted   Primary osteoarthritis of right knee 06/02/2020   Left groin hernia 05/22/2020   Sleep disturbance 01/09/2020   Acute lower UTI    Anxiety state    Peripheral edema    Leukocytosis    Debility 12/13/2019   Dyslipidemia    Rectal bleeding    Acute blood loss anemia    Ischemic colitis ([place])    Status post femoral-popliteal bypass surgery 12/09/2019   Secondary hypercoagulable state ([place]) 12/03/2019   Paroxysmal atrial fibrillation ([place]) 11/27/2019   Chronic bilateral low back pain with right-sided sciatica 08/29/2019   Chronic pain of right knee 08/29/2019   Abnormality of gait 05/30/2019   Neuropathic pain 05/30/2019   Myalgia 08/08/2018   Chronic  pain syndrome 06/27/2018   Gross hematuria 06/01/2018   Obesity (BMI 30.0-34.9) 11/14/2017   Failed back syndrome 09/28/2017   CKD (chronic kidney disease), stage III ([place]) 08/07/2017   Unilateral primary osteoarthritis, right knee 04/07/2016   Acute gout of left foot 03/22/2016   Pain in right ankle and joints of right foot 08/28/2014   Trigeminal neuralgia    Encounter for therapeutic drug monitoring 10/21/2013   Syncope 12/29/2011   Chronic low back pain 09/13/2010   Allergic rhinitis due to pollen 06/25/2010   COPD mixed type ([place]) 01/22/2010   Solitary pulmonary nodule 01/12/2010   Insulin resistance 01/06/2009    Osteopenia 01/06/2009   Eczema 11/26/2007   Peripheral arterial disease ([place]) 06/29/2007   Hyperlipidemia 10/23/2006   Essential hypertension 10/23/2006   ESOPHAGEAL STRICTURE 08/09/1993   Past Medical History:  Diagnosis Date   Allergic rhinoconjunctivitis    Allergy    Anxiety    pt. managed- uses deep breathing    Arthritis    low back , stenosis   COLONIC POLYPS, RECURRENT 08/29/2006   2008 last colonoscopy. No further colonoscopy.      COPD (chronic obstructive pulmonary disease) (HCC)    Diverticulosis    Dysrhythmia    afib   Eczema    Environmental allergies    allergy shot- q friday in Dr. Janee Morn office. PFT's abnormal- recommended Spiriva to use preop & will d/c after surgery   GERD (gastroesophageal reflux disease)    Hiatal hernia    Hyperlipidemia    Hypertension    Lung nodule 2011   Paroxysmal atrial fibrillation ([place]) 11/27/2019   Peripheral vascular disease ([place])    Shingles    Stenosis of popliteal artery (HCC)    blood clots in legs long ago     Trigeminal neuralgia    Left buttocks    Family History  Problem Relation Age of Onset   Arthritis Mother    Hypertension Mother    Heart disease Father    Colon polyps Father    Breast cancer Sister    Breast cancer Daughter    Hypertension Son    Lung cancer Brother    Colon cancer Sister    Brain cancer Sister    Lung cancer Sister    Anesthesia problems Neg Hx    Hypotension Neg Hx    Malignant hyperthermia Neg Hx    Pseudochol deficiency Neg Hx     Past Surgical History:  Procedure Laterality Date   ABDOMINAL AORTAGRAM N/A 06/17/2011   Procedure: ABDOMINAL Maxcine Ham;  Surgeon: Elam Dutch, MD;  Location: [facility] CATH LAB;  Service: Cardiovascular;  Laterality: N/A;   ABDOMINAL AORTOGRAM W/LOWER EXTREMITY Bilateral 06/22/2018   Procedure: ABDOMINAL AORTOGRAM W/LOWER EXTREMITY;  Surgeon: Elam Dutch, MD;  Location: [place] CV LAB;  Service: Cardiovascular;  Laterality: Bilateral;    ABDOMINAL AORTOGRAM W/LOWER EXTREMITY Bilateral 11/29/2019   Procedure: ABDOMINAL AORTOGRAM W/LOWER EXTREMITY;  Surgeon: Elam Dutch, MD;  Location: [place] CV LAB;  Service: Cardiovascular;  Laterality: Bilateral;   CATARACT EXTRACTION, BILATERAL     w IOL   CHOLECYSTECTOMY  1998   COLONOSCOPY     ENDARTERECTOMY FEMORAL Left 12/09/2019   Procedure: ENDARTERECTOMY FEMORAL with bovine patch angioplasty.;  Surgeon: Elam Dutch, MD;  Location: [facility] OR;  Service: Vascular;  Laterality: Left;   FEMORAL-POPLITEAL BYPASS GRAFT        x2 surgeries 1990's & 2009   FEMORAL-POPLITEAL BYPASS GRAFT Right 12/09/2019   Procedure:  REDO RIGHT FEMORAL-POPLITEAL ARTERY BYPASS GRAFT;  Surgeon: Elam Dutch, MD;  Location: [place];  Service: Vascular;  Laterality: Right;   INJECTION KNEE Right Aug. 2016   Gel injection for pain   LUMBAR FUSION  07/06/2011   TONSILLECTOMY     as a teenager    TUBAL LIGATION     Social History   Occupational History   Occupation: retired    Fish farm manager: RETIRED  Tobacco Use   Smoking status: Former    Packs/day: 2.00    Years: 30.00    Pack years: 60.00    Types: Cigarettes    Quit date: 06/18/1993    Years since quitting: 27.3   Smokeless tobacco: Never   Tobacco comments:    Daisy  Vaping Use   Vaping Use: Never used  Substance and Sexual Activity   Alcohol use: No    Alcohol/week: 0.0 standard drinks   Drug use: No   Sexual activity: Not on file

## 2020-10-13 ENCOUNTER — Ambulatory Visit: Payer: Medicare Other

## 2020-10-13 ENCOUNTER — Other Ambulatory Visit: Payer: Self-pay

## 2020-10-13 DIAGNOSIS — R2689 Other abnormalities of gait and mobility: Secondary | ICD-10-CM | POA: Diagnosis not present

## 2020-10-13 DIAGNOSIS — R252 Cramp and spasm: Secondary | ICD-10-CM

## 2020-10-13 DIAGNOSIS — M6281 Muscle weakness (generalized): Secondary | ICD-10-CM

## 2020-10-13 DIAGNOSIS — G8929 Other chronic pain: Secondary | ICD-10-CM

## 2020-10-13 DIAGNOSIS — M545 Low back pain, unspecified: Secondary | ICD-10-CM | POA: Diagnosis not present

## 2020-10-13 NOTE — Therapy (Signed)
[facility] [address], [place], [name], [address] Phone: [phone]   Fax:  [phone]  Physical Therapy Treatment  Patient Details  Name: [name] [name] MRN: [id number] Date of Birth: 25-Apr-1925 Referring Provider (PT): Delice Lesch, MD   Encounter Date: 10/13/2020   PT End of Session - 10/13/20 1101     Visit Number 9    Date for PT Re-Evaluation 11/04/20    Authorization Type Medicare/BCBS    Progress Note Due on Visit 10    PT Start Time 1016    PT Stop Time 1101    PT Time Calculation (min) 45 min    Activity Tolerance Patient tolerated treatment well    Behavior During Therapy [facility] for tasks assessed/performed             Past Medical History:  Diagnosis Date   Allergic rhinoconjunctivitis    Allergy    Anxiety    pt. managed- uses deep breathing    Arthritis    low back , stenosis   COLONIC POLYPS, RECURRENT 08/29/2006   2008 last colonoscopy. No further colonoscopy.      COPD (chronic obstructive pulmonary disease) (HCC)    Diverticulosis    Dysrhythmia    afib   Eczema    Environmental allergies    allergy shot- q friday in Dr. Janee Morn office. PFT's abnormal- recommended Spiriva to use preop & will d/c after surgery   GERD (gastroesophageal reflux disease)    Hiatal hernia    Hyperlipidemia    Hypertension    Lung nodule 2011   Paroxysmal atrial fibrillation ([place]) 11/27/2019   Peripheral vascular disease (HCC)    Shingles    Stenosis of popliteal artery (HCC)    blood clots in legs long ago     Trigeminal neuralgia    Left buttocks    Past Surgical History:  Procedure Laterality Date   ABDOMINAL AORTAGRAM N/A 06/17/2011   Procedure: ABDOMINAL Maxcine Ham;  Surgeon: Elam Dutch, MD;  Location: [facility] CATH LAB;  Service: Cardiovascular;  Laterality: N/A;   ABDOMINAL AORTOGRAM W/LOWER EXTREMITY Bilateral 06/22/2018   Procedure: ABDOMINAL AORTOGRAM W/LOWER EXTREMITY;  Surgeon:  Elam Dutch, MD;  Location: [place] CV LAB;  Service: Cardiovascular;  Laterality: Bilateral;   ABDOMINAL AORTOGRAM W/LOWER EXTREMITY Bilateral 11/29/2019   Procedure: ABDOMINAL AORTOGRAM W/LOWER EXTREMITY;  Surgeon: Elam Dutch, MD;  Location: [place] CV LAB;  Service: Cardiovascular;  Laterality: Bilateral;   CATARACT EXTRACTION, BILATERAL     w IOL   CHOLECYSTECTOMY  1998   COLONOSCOPY     ENDARTERECTOMY FEMORAL Left 12/09/2019   Procedure: ENDARTERECTOMY FEMORAL with bovine patch angioplasty.;  Surgeon: Elam Dutch, MD;  Location: [place];  Service: Vascular;  Laterality: Left;   FEMORAL-POPLITEAL BYPASS GRAFT        x2 surgeries 1990's & 2009   FEMORAL-POPLITEAL BYPASS GRAFT Right 12/09/2019   Procedure: REDO RIGHT FEMORAL-POPLITEAL ARTERY BYPASS GRAFT;  Surgeon: Elam Dutch, MD;  Location: [place];  Service: Vascular;  Laterality: Right;   INJECTION KNEE Right Aug. 2016   Gel injection for pain   LUMBAR FUSION  07/06/2011   TONSILLECTOMY     as a teenager    TUBAL LIGATION      There were no vitals filed for this visit.   Subjective Assessment - 10/13/20 1033     Subjective I feel 25% better with my back, my balance feels 30-35%.  My back is really  hurting today.    Currently in Pain? Yes    Pain Score 5     Pain Location Back    Pain Orientation Lower    Pain Descriptors / Indicators Sore    Pain Type Chronic pain                               [facility] Adult PT Treatment/Exercise - 10/13/20 0001       Lumbar Exercises: Stretches   Active Hamstring Stretch Left;Right;3 reps;20 seconds      Lumbar Exercises: Aerobic   Nustep L2 x 10 min   PT present to discuss progress, MHP to low back     Lumbar Exercises: Seated   Other Seated Lumbar Exercises core activation with hip abduction-green band 2x10      Knee/Hip Exercises: Standing   Heel Raises Both;20 reps    Heel Raises Limitations on balance pad    Rocker Board 3 minutes     Rebounder weight shifting 3 ways x 1 min each.  Min use of UEs   on balance pad     Knee/Hip Exercises: Seated   Sit to Sand 2 sets;without UE support;10 reps   holding 5# kettlebell                     PT Short Term Goals - 10/07/20 1057       PT SHORT TERM GOAL #2   Title improved upright posture in standing due to increased hip extension and increased glute strength    Time 4    Period Weeks    Status Achieved    Target Date 10/07/20               PT Long Term Goals - 09/09/20 1152       PT LONG TERM GOAL #1   Title independent with advanced HEP    Time 8    Period Weeks    Status New    Target Date 11/04/20      PT LONG TERM GOAL #2   Title improve FOTO to > or = to 49 to improve function    Baseline 43    Time 8    Period Weeks    Status New    Target Date 11/04/20      PT LONG TERM GOAL #3   Title improve LE endurance and strength to stand and walk for 10-15 minutes without significant limitation    Time 8    Period Weeks    Status New    Target Date 11/04/20      PT LONG TERM GOAL #4   Title report < or = to 6-7/10 LBP in the morning hours to improve function    Time 8    Period Weeks    Status New    Target Date 11/04/20      PT LONG TERM GOAL #5   Title perform 5x sit to stand in < or = to 12 seconds to improve balance    Baseline --    Time 8    Period Weeks    Status New    Target Date 11/11/20                   Plan - 10/13/20 1057     Clinical Impression Statement Pt arrived with 5/10 LBP today and was willing to try exercise.  Pt reported that  her back pain improved after the NuStep with heat.  Pt did well with all exercise and performed with good control and stability.  Pt reports 30-35% reduction in balance since the start of care and 25% reduction in LBP. Pt has been walking on grass to practice on unlevel surfaces with her cane at home.  Pt will continue to benefit from skilled PT to address balance,  strength and endurance for safety at home and in the clinic.    PT Frequency 2x / week    PT Duration 8 weeks    PT Treatment/Interventions ADLs/Self Care Home Management;Cryotherapy;Electrical Stimulation;Moist Heat;Traction;Stair training;Gait training;Therapeutic activities;Therapeutic exercise;Balance training;Neuromuscular re-education;Patient/family education;Manual techniques;Dry needling;Passive range of motion    PT Next Visit Plan strength, balance, core activation    PT Home Exercise Plan Access Code: [email or web address]    Consulted and Agree with Plan of Care Patient             Patient will benefit from skilled therapeutic intervention in order to improve the following deficits and impairments:  Abnormal gait, Decreased activity tolerance, Decreased strength, Impaired flexibility, Postural dysfunction, Pain, Decreased range of motion, Difficulty walking, Decreased endurance, Decreased balance  Visit Diagnosis: Chronic bilateral low back pain without sciatica  Other abnormalities of gait and mobility  Cramp and spasm  Muscle weakness (generalized)     Problem List Patient Active Problem List   Diagnosis Date Noted   Primary osteoarthritis of right knee 06/02/2020   Left groin hernia 05/22/2020   Sleep disturbance 01/09/2020   Acute lower UTI    Anxiety state    Peripheral edema    Leukocytosis    Debility 12/13/2019   Dyslipidemia    Rectal bleeding    Acute blood loss anemia    Ischemic colitis (HCC)    Status post femoral-popliteal bypass surgery 12/09/2019   Secondary hypercoagulable state ([place]) 12/03/2019   Paroxysmal atrial fibrillation (HCC) 11/27/2019   Chronic bilateral low back pain with right-sided sciatica 08/29/2019   Chronic pain of right knee 08/29/2019   Abnormality of gait 05/30/2019   Neuropathic pain 05/30/2019   Myalgia 08/08/2018   Chronic pain syndrome 06/27/2018   Gross hematuria 06/01/2018   Obesity (BMI 30.0-34.9) 11/14/2017   Failed  back syndrome 09/28/2017   CKD (chronic kidney disease), stage III ([place]) 08/07/2017   Unilateral primary osteoarthritis, right knee 04/07/2016   Acute gout of left foot 03/22/2016   Pain in right ankle and joints of right foot 08/28/2014   Trigeminal neuralgia    Encounter for therapeutic drug monitoring 10/21/2013   Syncope 12/29/2011   Chronic low back pain 09/13/2010   Allergic rhinitis due to pollen 06/25/2010   COPD mixed type ([place]) 01/22/2010   Solitary pulmonary nodule 01/12/2010   Insulin resistance 01/06/2009   Osteopenia 01/06/2009   Eczema 11/26/2007   Peripheral arterial disease ([place]) 06/29/2007   Hyperlipidemia 10/23/2006   Essential hypertension 10/23/2006   ESOPHAGEAL STRICTURE 08/09/1993    Sigurd Sos, PT 10/13/20 11:03 AM   [facility] [address], [place], [name], [address] Phone: [phone]   Fax:  [phone]  Name: [name] [name] MRN: [id number] Date of Birth: February 01, 1925

## 2020-10-15 ENCOUNTER — Other Ambulatory Visit: Payer: Self-pay

## 2020-10-15 ENCOUNTER — Ambulatory Visit: Payer: Medicare Other

## 2020-10-15 DIAGNOSIS — G8929 Other chronic pain: Secondary | ICD-10-CM | POA: Diagnosis not present

## 2020-10-15 DIAGNOSIS — M6281 Muscle weakness (generalized): Secondary | ICD-10-CM | POA: Diagnosis not present

## 2020-10-15 DIAGNOSIS — M545 Low back pain, unspecified: Secondary | ICD-10-CM | POA: Diagnosis not present

## 2020-10-15 DIAGNOSIS — R252 Cramp and spasm: Secondary | ICD-10-CM

## 2020-10-15 DIAGNOSIS — R2689 Other abnormalities of gait and mobility: Secondary | ICD-10-CM | POA: Diagnosis not present

## 2020-10-15 NOTE — Therapy (Signed)
[facility] [address] [place], [name], [address] Phone: [phone]   Fax:  [phone]  Physical Therapy Treatment  Patient Details  Name: [name] [name] MRN: [id number] Date of Birth: 1925-02-27 Referring Provider ([name] [name]): Delice Lesch, MD   Encounter Date: 10/15/2020 Progress Note Reporting Period 09/09/20 to 10/15/20  See note below for Objective Data and Assessment of Progress/Goals.      [name] [name] End of Session - 10/15/20 1102     Visit Number 10    Date for [name] [name] Re-Evaluation 11/26/20    Authorization Type Medicare/BCBS    Progress Note Due on Visit 20    [name] [name] Start Time 1015    [name] [name] Stop Time 1102    [name] [name] Time Calculation (min) 47 min    Activity Tolerance Patient tolerated treatment well    Behavior During Therapy WFL for tasks assessed/performed             Past Medical History:  Diagnosis Date   Allergic rhinoconjunctivitis    Allergy    Anxiety    [name] [name]. managed- uses deep breathing    Arthritis    low back , stenosis   COLONIC POLYPS, RECURRENT 08/29/2006   2008 last colonoscopy. No further colonoscopy.      COPD (chronic obstructive pulmonary disease) (HCC)    Diverticulosis    Dysrhythmia    afib   Eczema    Environmental allergies    allergy shot- q friday in Dr. Janee Morn office. PFT's abnormal- recommended Spiriva to use preop & will d/c after surgery   GERD (gastroesophageal reflux disease)    Hiatal hernia    Hyperlipidemia    Hypertension    Lung nodule 2011   Paroxysmal atrial fibrillation ([place]) 11/27/2019   Peripheral vascular disease (HCC)    Shingles    Stenosis of popliteal artery (HCC)    blood clots in legs long ago     Trigeminal neuralgia    Left buttocks    Past Surgical History:  Procedure Laterality Date   ABDOMINAL AORTAGRAM N/A 06/17/2011   Procedure: ABDOMINAL Maxcine Ham;  Surgeon: Elam Dutch, MD;  Location: [facility] CATH LAB;  Service: Cardiovascular;  Laterality: N/A;    ABDOMINAL AORTOGRAM W/LOWER EXTREMITY Bilateral 06/22/2018   Procedure: ABDOMINAL AORTOGRAM W/LOWER EXTREMITY;  Surgeon: Elam Dutch, MD;  Location: [place] CV LAB;  Service: Cardiovascular;  Laterality: Bilateral;   ABDOMINAL AORTOGRAM W/LOWER EXTREMITY Bilateral 11/29/2019   Procedure: ABDOMINAL AORTOGRAM W/LOWER EXTREMITY;  Surgeon: Elam Dutch, MD;  Location: [place] CV LAB;  Service: Cardiovascular;  Laterality: Bilateral;   CATARACT EXTRACTION, BILATERAL     w IOL   CHOLECYSTECTOMY  1998   COLONOSCOPY     ENDARTERECTOMY FEMORAL Left 12/09/2019   Procedure: ENDARTERECTOMY FEMORAL with bovine patch angioplasty.;  Surgeon: Elam Dutch, MD;  Location: [place];  Service: Vascular;  Laterality: Left;   FEMORAL-POPLITEAL BYPASS GRAFT        x2 surgeries 1990's & 2009   FEMORAL-POPLITEAL BYPASS GRAFT Right 12/09/2019   Procedure: REDO RIGHT FEMORAL-POPLITEAL ARTERY BYPASS GRAFT;  Surgeon: Elam Dutch, MD;  Location: [place];  Service: Vascular;  Laterality: Right;   INJECTION KNEE Right Aug. 2016   Gel injection for pain   LUMBAR FUSION  07/06/2011   TONSILLECTOMY     as a teenager    TUBAL LIGATION      There were no vitals filed for this visit.   Subjective Assessment - 10/15/20 1023  Currently in Pain? No/denies    Pain Score --   up to 5/10 early in the morning, no longer having high pain levels at the end of the day.   Pain Location Back    Pain Orientation Lower                [facility] [name] [name] Assessment - 10/15/20 0001       Assessment   Medical Diagnosis chronic pain syndrome, failed back syndrome, abnormality of gait    Referring Provider ([name] [name]) Delice Lesch, MD      Cognition   Overall Cognitive Status Within Functional Limits for tasks assessed      Observation/Other Assessments   Focus on Therapeutic Outcomes (FOTO)  53 (goal is 49)      Posture/Postural Control   Posture/Postural Control Postural limitations      Strength   Overall  Strength Deficits      Transfers   Five time sit to stand comments  9.88 seconds    Comments ABC: 1100 raw score-68.78 confident                           [facility] Adult [name] [name] Treatment/Exercise - 10/15/20 0001       Lumbar Exercises: Aerobic   Nustep L2 x 10 min   [name] [name] present to discuss progress     Lumbar Exercises: Seated   Other Seated Lumbar Exercises core activation with hip abduction-green band 2x10      Knee/Hip Exercises: Standing   Rebounder weight shifting 3 ways x 1 min each.  Min use of UEs   on balance pad     Knee/Hip Exercises: Seated   Sit to Sand 2 sets;without UE support;10 reps   holding 5# kettlebell                     [name] [name] Short Term Goals - 10/07/20 1057       [name] [name] SHORT TERM GOAL #2   Title improved upright posture in standing due to increased hip extension and increased glute strength    Time 4    Period Weeks    Status Achieved    Target Date 10/07/20               [name] [name] Long Term Goals - 10/15/20 1021       [name] [name] LONG TERM GOAL #1   Title independent with advanced HEP    Time 6    Period Weeks    Status On-going    Target Date 11/26/20      [name] [name] LONG TERM GOAL #2   Title improve FOTO to > or = to 49 to improve function    Baseline 53    Status Achieved      [name] [name] LONG TERM GOAL #3   Title improve LE endurance and strength to stand and walk for 10-15 minutes without significant limitation    Baseline 35-45 min    Status Achieved      [name] [name] LONG TERM GOAL #4   Title report a 60% reduction in LBP with ADLs and self-care    Baseline --    Time 6    Period Weeks    Status Revised    Target Date 11/26/20      [name] [name] LONG TERM GOAL #5   Title perform 5x sit to stand in < or = to 12 seconds to improve balance    Baseline 9.88  Status Achieved      Additional Long Term Goals   Additional Long Term Goals Yes      [name] [name] LONG TERM GOAL #6   Title improve ABC confidence scale score to > or = to 78%    Baseline 68.78% (1100  raw score)    Time 6    Period Weeks    Status New    Target Date 11/26/20                   Plan - 10/15/20 1120     Clinical Impression Statement No back pain reported this morning.  [name] [name] is making steady progress toward goals and is now able to walk 30-45 minutes without limitation, at evaluation this was limited to 3-4 minutes.  [name] [name] did well with all exercise and performed with good control and stability.  [name] [name] reports 30-35% reduction in balance since the start of care and 25% reduction in LBP. [name] [name] has been walking on grass to practice on unlevel surfaces with her cane at home.  ABC confidence scale score is 65.78%   [name] [name] will continue to benefit from reduced frequency of [name] [name] (1x/wk after next week) to address balance, strength and endurance for safety at home and in the clinic.    Comorbidities lumbar surgery, multiple leg surgeries and weakness, COPD, age, 2 body parts    Rehab Potential Good    [name] [name] Frequency 1x / week    [name] [name] Duration 6 weeks    [name] [name] Treatment/Interventions ADLs/Self Care Home Management;Cryotherapy;Electrical Stimulation;Moist Heat;Traction;Stair training;Gait training;Therapeutic activities;Therapeutic exercise;Balance training;Neuromuscular re-education;Patient/family education;Manual techniques;Dry needling;Passive range of motion    [name] [name] Next Visit Plan strength, balance, core activation.  [name] [name] will reduce to 1x/wk after next week    [name] [name] Home Exercise Plan Access Code: [email or web address]    Recommended Other Services recert sent 0/97/35    Consulted and Agree with Plan of Care Patient             Patient will benefit from skilled therapeutic intervention in order to improve the following deficits and impairments:  Abnormal gait, Decreased activity tolerance, Decreased strength, Impaired flexibility, Postural dysfunction, Pain, Decreased range of motion, Difficulty walking, Decreased endurance, Decreased balance  Visit Diagnosis: Chronic bilateral low back pain without sciatica  - Plan: [name] [name] plan of care cert/re-cert  Other abnormalities of gait and mobility - Plan: [name] [name] plan of care cert/re-cert  Cramp and spasm - Plan: [name] [name] plan of care cert/re-cert  Muscle weakness (generalized) - Plan: [name] [name] plan of care cert/re-cert     Problem List Patient Active Problem List   Diagnosis Date Noted   Primary osteoarthritis of right knee 06/02/2020   Left groin hernia 05/22/2020   Sleep disturbance 01/09/2020   Acute lower UTI    Anxiety state    Peripheral edema    Leukocytosis    Debility 12/13/2019   Dyslipidemia    Rectal bleeding    Acute blood loss anemia    Ischemic colitis ([place])    Status post femoral-popliteal bypass surgery 12/09/2019   Secondary hypercoagulable state ([place]) 12/03/2019   Paroxysmal atrial fibrillation ([place]) 11/27/2019   Chronic bilateral low back pain with right-sided sciatica 08/29/2019   Chronic pain of right knee 08/29/2019   Abnormality of gait 05/30/2019   Neuropathic pain 05/30/2019   Myalgia 08/08/2018   Chronic pain syndrome 06/27/2018   Gross hematuria 06/01/2018   Obesity (BMI 30.0-34.9) 11/14/2017   Failed back syndrome 09/28/2017   CKD (chronic kidney disease), stage III ([place])  08/07/2017   Unilateral primary osteoarthritis, right knee 04/07/2016   Acute gout of left foot 03/22/2016   Pain in right ankle and joints of right foot 08/28/2014   Trigeminal neuralgia    Encounter for therapeutic drug monitoring 10/21/2013   Syncope 12/29/2011   Chronic low back pain 09/13/2010   Allergic rhinitis due to pollen 06/25/2010   COPD mixed type ([place]) 01/22/2010   Solitary pulmonary nodule 01/12/2010   Insulin resistance 01/06/2009   Osteopenia 01/06/2009   Eczema 11/26/2007   Peripheral arterial disease ([place]) 06/29/2007   Hyperlipidemia 10/23/2006   Essential hypertension 10/23/2006   ESOPHAGEAL STRICTURE 08/09/1993     [name] [name], [name] [name] 10/15/20 11:46 AM   [facility] [address] [place], [name], [address] Phone: [phone]   Fax:  [phone]  Name: [name] [name] MRN: [id number] Date of Birth: 1924-11-14

## 2020-10-19 ENCOUNTER — Ambulatory Visit: Payer: Medicare Other | Admitting: Physical Therapy

## 2020-10-19 ENCOUNTER — Encounter: Payer: Self-pay | Admitting: Physical Therapy

## 2020-10-19 ENCOUNTER — Other Ambulatory Visit: Payer: Self-pay

## 2020-10-19 DIAGNOSIS — R252 Cramp and spasm: Secondary | ICD-10-CM

## 2020-10-19 DIAGNOSIS — R2689 Other abnormalities of gait and mobility: Secondary | ICD-10-CM

## 2020-10-19 DIAGNOSIS — G8929 Other chronic pain: Secondary | ICD-10-CM

## 2020-10-19 DIAGNOSIS — M6281 Muscle weakness (generalized): Secondary | ICD-10-CM

## 2020-10-19 DIAGNOSIS — M545 Low back pain, unspecified: Secondary | ICD-10-CM | POA: Diagnosis not present

## 2020-10-19 NOTE — Therapy (Signed)
[facility] [address] [place], [name], [address] Phone: [phone]   Fax:  [phone]  Physical Therapy Treatment  Patient Details  Name: [name] [name] MRN: [id number] Date of Birth: 12-Feb-1925 Referring Provider (PT): Delice Lesch, MD   Encounter Date: 10/19/2020   PT End of Session - 10/19/20 1019     Visit Number 11    Date for PT Re-Evaluation 11/26/20    Authorization Type Medicare/BCBS    Progress Note Due on Visit 20    PT Start Time 1016    PT Stop Time 1059    PT Time Calculation (min) 43 min    Activity Tolerance Patient tolerated treatment well    Behavior During Therapy [facility] for tasks assessed/performed             Past Medical History:  Diagnosis Date   Allergic rhinoconjunctivitis    Allergy    Anxiety    pt. managed- uses deep breathing    Arthritis    low back , stenosis   COLONIC POLYPS, RECURRENT 08/29/2006   2008 last colonoscopy. No further colonoscopy.      COPD (chronic obstructive pulmonary disease) (HCC)    Diverticulosis    Dysrhythmia    afib   Eczema    Environmental allergies    allergy shot- q friday in Dr. Janee Morn office. PFT's abnormal- recommended Spiriva to use preop & will d/c after surgery   GERD (gastroesophageal reflux disease)    Hiatal hernia    Hyperlipidemia    Hypertension    Lung nodule 2011   Paroxysmal atrial fibrillation ([place]) 11/27/2019   Peripheral vascular disease (HCC)    Shingles    Stenosis of popliteal artery (HCC)    blood clots in legs long ago     Trigeminal neuralgia    Left buttocks    Past Surgical History:  Procedure Laterality Date   ABDOMINAL AORTAGRAM N/A 06/17/2011   Procedure: ABDOMINAL Maxcine Ham;  Surgeon: Elam Dutch, MD;  Location: [facility] CATH LAB;  Service: Cardiovascular;  Laterality: N/A;   ABDOMINAL AORTOGRAM W/LOWER EXTREMITY Bilateral 06/22/2018   Procedure: ABDOMINAL AORTOGRAM W/LOWER EXTREMITY;  Surgeon:  Elam Dutch, MD;  Location: [place] CV LAB;  Service: Cardiovascular;  Laterality: Bilateral;   ABDOMINAL AORTOGRAM W/LOWER EXTREMITY Bilateral 11/29/2019   Procedure: ABDOMINAL AORTOGRAM W/LOWER EXTREMITY;  Surgeon: Elam Dutch, MD;  Location: [place] CV LAB;  Service: Cardiovascular;  Laterality: Bilateral;   CATARACT EXTRACTION, BILATERAL     w IOL   CHOLECYSTECTOMY  1998   COLONOSCOPY     ENDARTERECTOMY FEMORAL Left 12/09/2019   Procedure: ENDARTERECTOMY FEMORAL with bovine patch angioplasty.;  Surgeon: Elam Dutch, MD;  Location: [place];  Service: Vascular;  Laterality: Left;   FEMORAL-POPLITEAL BYPASS GRAFT        x2 surgeries 1990's & 2009   FEMORAL-POPLITEAL BYPASS GRAFT Right 12/09/2019   Procedure: REDO RIGHT FEMORAL-POPLITEAL ARTERY BYPASS GRAFT;  Surgeon: Elam Dutch, MD;  Location: [place];  Service: Vascular;  Laterality: Right;   INJECTION KNEE Right Aug. 2016   Gel injection for pain   LUMBAR FUSION  07/06/2011   TONSILLECTOMY     as a teenager    TUBAL LIGATION      There were no vitals filed for this visit.   Subjective Assessment - 10/19/20 1020     Subjective Having a good morning with my back!    Pertinent History COPD, HTN, multiple vascular  surgeries in legs, chronic LBP    Currently in Pain? Yes    Pain Score 2     Pain Location Back    Pain Descriptors / Indicators Dull    Aggravating Factors  Usually upon waking it is the worst    Pain Relieving Factors Sitting down    Multiple Pain Sites No                               [facility] Adult PT Treatment/Exercise - 10/19/20 0001       Lumbar Exercises: Stretches   Active Hamstring Stretch Left;Right;3 reps;20 seconds      Lumbar Exercises: Aerobic   Nustep L2 x 10 min   PTA present to discuss progress     Lumbar Exercises: Standing   Row Strengthening;Both;Theraband;20 reps    Theraband Level (Row) Level 3 (Green)    Shoulder Extension  Strengthening;Both;Theraband;20 reps    Theraband Level (Shoulder Extension) Level 2 (Red)      Lumbar Exercises: Seated   Other Seated Lumbar Exercises core activation with hip abduction-green band 2x15    Other Seated Lumbar Exercises Sit fit pelvic rocks 20x2  in each direction.TC to keep pt from moving trunk > pelvis   Added pelvic circles 10x each direction: feet on black square     Knee/Hip Exercises: Standing   Rebounder weight shifting 3 ways x 1 min each.  Min use of UEs   on balance pad                     PT Short Term Goals - 10/07/20 1057       PT SHORT TERM GOAL #2   Title improved upright posture in standing due to increased hip extension and increased glute strength    Time 4    Period Weeks    Status Achieved    Target Date 10/07/20               PT Long Term Goals - 10/15/20 1021       PT LONG TERM GOAL #1   Title independent with advanced HEP    Time 6    Period Weeks    Status On-going    Target Date 11/26/20      PT LONG TERM GOAL #2   Title improve FOTO to > or = to 49 to improve function    Baseline 53    Status Achieved      PT LONG TERM GOAL #3   Title improve LE endurance and strength to stand and walk for 10-15 minutes without significant limitation    Baseline 35-45 min    Status Achieved      PT LONG TERM GOAL #4   Title report a 60% reduction in LBP with ADLs and self-care    Baseline --    Time 6    Period Weeks    Status Revised    Target Date 11/26/20      PT LONG TERM GOAL #5   Title perform 5x sit to stand in < or = to 12 seconds to improve balance    Baseline 9.88    Status Achieved      Additional Long Term Goals   Additional Long Term Goals Yes      PT LONG TERM GOAL #6   Title improve ABC confidence scale score to > or = to 78%  Baseline 68.78% (1100 raw score)    Time 6    Period Weeks    Status New    Target Date 11/26/20                   Plan - 10/19/20 1023     Clinical  Impression Statement Pt attends church with less reliance on her cane, including walking on grass to water her flowers. She arrives with very mild back pain today. Pt performed all exercises with no difficulties.    Personal Factors and Comorbidities Comorbidity 3+    Comorbidities lumbar surgery, multiple leg surgeries and weakness, COPD, age, 2 body parts    Examination-Activity Limitations Locomotion Level;Stand;Stairs    Examination-Participation Restrictions Community Activity;Shop    Stability/Clinical Decision Making Evolving/Moderate complexity    Rehab Potential Good    PT Frequency 1x / week    PT Duration 6 weeks    PT Treatment/Interventions ADLs/Self Care Home Management;Cryotherapy;Electrical Stimulation;Moist Heat;Traction;Stair training;Gait training;Therapeutic activities;Therapeutic exercise;Balance training;Neuromuscular re-education;Patient/family education;Manual techniques;Dry needling;Passive range of motion    PT Next Visit Plan strength, balance, core activation.  Pt will reduce to 1x/wk after next week    PT Home Exercise Plan Access Code: [email or web address]    Consulted and Agree with Plan of Care Patient             Patient will benefit from skilled therapeutic intervention in order to improve the following deficits and impairments:  Abnormal gait, Decreased activity tolerance, Decreased strength, Impaired flexibility, Postural dysfunction, Pain, Decreased range of motion, Difficulty walking, Decreased endurance, Decreased balance  Visit Diagnosis: Chronic bilateral low back pain without sciatica  Other abnormalities of gait and mobility  Cramp and spasm  Muscle weakness (generalized)     Problem List Patient Active Problem List   Diagnosis Date Noted   Primary osteoarthritis of right knee 06/02/2020   Left groin hernia 05/22/2020   Sleep disturbance 01/09/2020   Acute lower UTI    Anxiety state    Peripheral edema    Leukocytosis    Debility 12/13/2019    Dyslipidemia    Rectal bleeding    Acute blood loss anemia    Ischemic colitis ([place])    Status post femoral-popliteal bypass surgery 12/09/2019   Secondary hypercoagulable state ([place]) 12/03/2019   Paroxysmal atrial fibrillation (HCC) 11/27/2019   Chronic bilateral low back pain with right-sided sciatica 08/29/2019   Chronic pain of right knee 08/29/2019   Abnormality of gait 05/30/2019   Neuropathic pain 05/30/2019   Myalgia 08/08/2018   Chronic pain syndrome 06/27/2018   Gross hematuria 06/01/2018   Obesity (BMI 30.0-34.9) 11/14/2017   Failed back syndrome 09/28/2017   CKD (chronic [name] disease), stage III ([place]) 08/07/2017   Unilateral primary osteoarthritis, right knee 04/07/2016   Acute gout of left foot 03/22/2016   Pain in right ankle and joints of right foot 08/28/2014   Trigeminal neuralgia    Encounter for therapeutic drug monitoring 10/21/2013   Syncope 12/29/2011   Chronic low back pain 09/13/2010   Allergic rhinitis due to pollen 06/25/2010   COPD mixed type ([place]) 01/22/2010   Solitary pulmonary nodule 01/12/2010   Insulin resistance 01/06/2009   Osteopenia 01/06/2009   Eczema 11/26/2007   Peripheral arterial disease ([place]) 06/29/2007   Hyperlipidemia 10/23/2006   Essential hypertension 10/23/2006   ESOPHAGEAL STRICTURE 08/09/1993    Deshan Hemmelgarn, PTA 10/19/2020, 11:01 AM  [facility] [address], [place], [name], [address] Phone: [phone]  Fax:  [phone]  Name: [name] [name] MRN: [id number] Date of Birth: 1925-03-20

## 2020-10-21 ENCOUNTER — Other Ambulatory Visit: Payer: Self-pay

## 2020-10-21 ENCOUNTER — Ambulatory Visit: Payer: Medicare Other

## 2020-10-21 DIAGNOSIS — R252 Cramp and spasm: Secondary | ICD-10-CM | POA: Diagnosis not present

## 2020-10-21 DIAGNOSIS — G8929 Other chronic pain: Secondary | ICD-10-CM | POA: Diagnosis not present

## 2020-10-21 DIAGNOSIS — M545 Low back pain, unspecified: Secondary | ICD-10-CM | POA: Diagnosis not present

## 2020-10-21 DIAGNOSIS — M6281 Muscle weakness (generalized): Secondary | ICD-10-CM

## 2020-10-21 DIAGNOSIS — R2689 Other abnormalities of gait and mobility: Secondary | ICD-10-CM

## 2020-10-21 NOTE — Therapy (Signed)
[facility] [address], [place], [name], [address] Phone: [phone]   Fax:  [phone]  Physical Therapy Treatment  Patient Details  Name: [name] [name] MRN: [id number] Date of Birth: 13-Jul-1924 Referring Provider (PT): Delice Lesch, MD   Encounter Date: 10/21/2020   PT End of Session - 10/21/20 1053     Visit Number 12    Date for PT Re-Evaluation 11/26/20    Authorization Type Medicare/BCBS    Progress Note Due on Visit 20    PT Start Time 1018    PT Stop Time 1100    PT Time Calculation (min) 42 min    Activity Tolerance Patient tolerated treatment well    Behavior During Therapy [facility] for tasks assessed/performed             Past Medical History:  Diagnosis Date   Allergic rhinoconjunctivitis    Allergy    Anxiety    pt. managed- uses deep breathing    Arthritis    low back , stenosis   COLONIC POLYPS, RECURRENT 08/29/2006   2008 last colonoscopy. No further colonoscopy.      COPD (chronic obstructive pulmonary disease) (HCC)    Diverticulosis    Dysrhythmia    afib   Eczema    Environmental allergies    allergy shot- q friday in Dr. Janee Morn office. PFT's abnormal- recommended Spiriva to use preop & will d/c after surgery   GERD (gastroesophageal reflux disease)    Hiatal hernia    Hyperlipidemia    Hypertension    Lung nodule 2011   Paroxysmal atrial fibrillation ([place]) 11/27/2019   Peripheral vascular disease (HCC)    Shingles    Stenosis of popliteal artery (HCC)    blood clots in legs long ago     Trigeminal neuralgia    Left buttocks    Past Surgical History:  Procedure Laterality Date   ABDOMINAL AORTAGRAM N/A 06/17/2011   Procedure: ABDOMINAL Maxcine Ham;  Surgeon: Elam Dutch, MD;  Location: [facility] CATH LAB;  Service: Cardiovascular;  Laterality: N/A;   ABDOMINAL AORTOGRAM W/LOWER EXTREMITY Bilateral 06/22/2018   Procedure: ABDOMINAL AORTOGRAM W/LOWER EXTREMITY;  Surgeon:  Elam Dutch, MD;  Location: [place] CV LAB;  Service: Cardiovascular;  Laterality: Bilateral;   ABDOMINAL AORTOGRAM W/LOWER EXTREMITY Bilateral 11/29/2019   Procedure: ABDOMINAL AORTOGRAM W/LOWER EXTREMITY;  Surgeon: Elam Dutch, MD;  Location: [place] CV LAB;  Service: Cardiovascular;  Laterality: Bilateral;   CATARACT EXTRACTION, BILATERAL     w IOL   CHOLECYSTECTOMY  1998   COLONOSCOPY     ENDARTERECTOMY FEMORAL Left 12/09/2019   Procedure: ENDARTERECTOMY FEMORAL with bovine patch angioplasty.;  Surgeon: Elam Dutch, MD;  Location: [place];  Service: Vascular;  Laterality: Left;   FEMORAL-POPLITEAL BYPASS GRAFT        x2 surgeries 1990's & 2009   FEMORAL-POPLITEAL BYPASS GRAFT Right 12/09/2019   Procedure: REDO RIGHT FEMORAL-POPLITEAL ARTERY BYPASS GRAFT;  Surgeon: Elam Dutch, MD;  Location: [place];  Service: Vascular;  Laterality: Right;   INJECTION KNEE Right Aug. 2016   Gel injection for pain   LUMBAR FUSION  07/06/2011   TONSILLECTOMY     as a teenager    TUBAL LIGATION      There were no vitals filed for this visit.   Subjective Assessment - 10/21/20 1022     Subjective My back is in pretty good shape.    Patient Stated Goals get legs stronger,  walk longer, reduce LBP    Currently in Pain? Yes    Pain Score 1     Pain Location Back    Pain Orientation Lower    Pain Onset More than a month ago                               [facility] Adult PT Treatment/Exercise - 10/21/20 0001       Lumbar Exercises: Aerobic   Nustep L2 x 10 min   PT present to discuss progress     Lumbar Exercises: Standing   Row Strengthening;Both;Theraband;20 reps    Theraband Level (Row) Level 3 (Green)    Row Limitations standing on balance pad    Shoulder Extension Strengthening;Both;Theraband;20 reps    Theraband Level (Shoulder Extension) Level 3 (Green)    Shoulder Extension Limitations standing on balance pad      Knee/Hip Exercises: Standing    Rebounder weight shifting 3 ways x 1 min each.  Min use of UEs   on balance pad     Knee/Hip Exercises: Seated   Sit to Sand 2 sets;without UE support;10 reps   holding 5# kettlebell                Balance Exercises - 10/21/20 0001       Balance Exercises: Standing   Other Standing Exercises Comments box stepping with colored pods on floor x 4 laps each direction.                 PT Short Term Goals - 10/07/20 1057       PT SHORT TERM GOAL #2   Title improved upright posture in standing due to increased hip extension and increased glute strength    Time 4    Period Weeks    Status Achieved    Target Date 10/07/20               PT Long Term Goals - 10/15/20 1021       PT LONG TERM GOAL #1   Title independent with advanced HEP    Time 6    Period Weeks    Status On-going    Target Date 11/26/20      PT LONG TERM GOAL #2   Title improve FOTO to > or = to 49 to improve function    Baseline 53    Status Achieved      PT LONG TERM GOAL #3   Title improve LE endurance and strength to stand and walk for 10-15 minutes without significant limitation    Baseline 35-45 min    Status Achieved      PT LONG TERM GOAL #4   Title report a 60% reduction in LBP with ADLs and self-care    Baseline --    Time 6    Period Weeks    Status Revised    Target Date 11/26/20      PT LONG TERM GOAL #5   Title perform 5x sit to stand in < or = to 12 seconds to improve balance    Baseline 9.88    Status Achieved      Additional Long Term Goals   Additional Long Term Goals Yes      PT LONG TERM GOAL #6   Title improve ABC confidence scale score to > or = to 78%    Baseline 68.78% (1100 raw score)    Time  6    Period Weeks    Status New    Target Date 11/26/20                   Plan - 10/21/20 1051     Clinical Impression Statement Pt with 2/10 LBP today and significant reduction in pain overall since the start of care.  Pt is making steady  progress toward goals and is now able to walk 30-45 minutes without limitation. Pt did well with all exercise and performed with good control and stability.  Pt did well with addition of balance pad with standing theraband attached.  Close supervision required with static balance tasks and CGA with dynamic balance tasks.  Pt will continue to benefit from reduced frequency of PT (1x/wk after next week) to address balance, strength and endurance for safety at home and in the clinic.    PT Frequency 1x / week    PT Duration 6 weeks    PT Treatment/Interventions ADLs/Self Care Home Management;Cryotherapy;Electrical Stimulation;Moist Heat;Traction;Stair training;Gait training;Therapeutic activities;Therapeutic exercise;Balance training;Neuromuscular re-education;Patient/family education;Manual techniques;Dry needling;Passive range of motion    PT Next Visit Plan strength, balance, core activation.  Pt will reduce to 1x/wk  next week    PT Home Exercise Plan Access Code: [email or web address]    Consulted and Agree with Plan of Care Patient             Patient will benefit from skilled therapeutic intervention in order to improve the following deficits and impairments:  Abnormal gait, Decreased activity tolerance, Decreased strength, Impaired flexibility, Postural dysfunction, Pain, Decreased range of motion, Difficulty walking, Decreased endurance, Decreased balance  Visit Diagnosis: Chronic bilateral low back pain without sciatica  Cramp and spasm  Muscle weakness (generalized)  Other abnormalities of gait and mobility     Problem List Patient Active Problem List   Diagnosis Date Noted   Primary osteoarthritis of right knee 06/02/2020   Left groin hernia 05/22/2020   Sleep disturbance 01/09/2020   Acute lower UTI    Anxiety state    Peripheral edema    Leukocytosis    Debility 12/13/2019   Dyslipidemia    Rectal bleeding    Acute blood loss anemia    Ischemic colitis ([place])    Status post  femoral-popliteal bypass surgery 12/09/2019   Secondary hypercoagulable state ([place]) 12/03/2019   Paroxysmal atrial fibrillation (HCC) 11/27/2019   Chronic bilateral low back pain with right-sided sciatica 08/29/2019   Chronic pain of right knee 08/29/2019   Abnormality of gait 05/30/2019   Neuropathic pain 05/30/2019   Myalgia 08/08/2018   Chronic pain syndrome 06/27/2018   Gross hematuria 06/01/2018   Obesity (BMI 30.0-34.9) 11/14/2017   Failed back syndrome 09/28/2017   CKD (chronic kidney disease), stage III ([place]) 08/07/2017   Unilateral primary osteoarthritis, right knee 04/07/2016   Acute gout of left foot 03/22/2016   Pain in right ankle and joints of right foot 08/28/2014   Trigeminal neuralgia    Encounter for therapeutic drug monitoring 10/21/2013   Syncope 12/29/2011   Chronic low back pain 09/13/2010   Allergic rhinitis due to pollen 06/25/2010   COPD mixed type ([place]) 01/22/2010   Solitary pulmonary nodule 01/12/2010   Insulin resistance 01/06/2009   Osteopenia 01/06/2009   Eczema 11/26/2007   Peripheral arterial disease ([place]) 06/29/2007   Hyperlipidemia 10/23/2006   Essential hypertension 10/23/2006   ESOPHAGEAL STRICTURE 08/09/1993    Sigurd Sos, PT 10/21/20 10:56 AM   [facility] [address] [place], [name], [address] Phone: [phone]   Fax:  [phone]  Name: [name] [name] MRN: [id number] Date of Birth: Jun 16, 1924

## 2020-10-28 ENCOUNTER — Encounter: Payer: Self-pay | Admitting: Physical Therapy

## 2020-10-28 ENCOUNTER — Ambulatory Visit: Payer: Medicare Other | Attending: Physical Medicine & Rehabilitation | Admitting: Physical Therapy

## 2020-10-28 ENCOUNTER — Other Ambulatory Visit: Payer: Self-pay

## 2020-10-28 DIAGNOSIS — M545 Low back pain, unspecified: Secondary | ICD-10-CM

## 2020-10-28 DIAGNOSIS — M6281 Muscle weakness (generalized): Secondary | ICD-10-CM

## 2020-10-28 DIAGNOSIS — R2689 Other abnormalities of gait and mobility: Secondary | ICD-10-CM

## 2020-10-28 DIAGNOSIS — G8929 Other chronic pain: Secondary | ICD-10-CM | POA: Diagnosis not present

## 2020-10-28 DIAGNOSIS — R252 Cramp and spasm: Secondary | ICD-10-CM | POA: Diagnosis not present

## 2020-10-28 NOTE — Therapy (Signed)
[facility] [address] [place], [name], [address] Phone: [phone]   Fax:  [phone]  Physical Therapy Treatment  Patient Details  Name: [name] [name] MRN: [id number] Date of Birth: May 25, 1924 Referring Provider (PT): Delice Lesch, MD   Encounter Date: 10/28/2020   PT End of Session - 10/28/20 1024     Visit Number 13    Date for PT Re-Evaluation 11/26/20    Authorization Type Medicare/BCBS    Progress Note Due on Visit 20    PT Start Time 1018    PT Stop Time 1100    PT Time Calculation (min) 42 min    Activity Tolerance Patient tolerated treatment well    Behavior During Therapy [facility] for tasks assessed/performed             Past Medical History:  Diagnosis Date   Allergic rhinoconjunctivitis    Allergy    Anxiety    pt. managed- uses deep breathing    Arthritis    low back , stenosis   COLONIC POLYPS, RECURRENT 08/29/2006   2008 last colonoscopy. No further colonoscopy.      COPD (chronic obstructive pulmonary disease) (HCC)    Diverticulosis    Dysrhythmia    afib   Eczema    Environmental allergies    allergy shot- q friday in Dr. Janee Morn office. PFT's abnormal- recommended Spiriva to use preop & will d/c after surgery   GERD (gastroesophageal reflux disease)    Hiatal hernia    Hyperlipidemia    Hypertension    Lung nodule 2011   Paroxysmal atrial fibrillation ([place]) 11/27/2019   Peripheral vascular disease (HCC)    Shingles    Stenosis of popliteal artery (HCC)    blood clots in legs long ago     Trigeminal neuralgia    Left buttocks    Past Surgical History:  Procedure Laterality Date   ABDOMINAL AORTAGRAM N/A 06/17/2011   Procedure: ABDOMINAL Maxcine Ham;  Surgeon: Elam Dutch, MD;  Location: [facility] CATH LAB;  Service: Cardiovascular;  Laterality: N/A;   ABDOMINAL AORTOGRAM W/LOWER EXTREMITY Bilateral 06/22/2018   Procedure: ABDOMINAL AORTOGRAM W/LOWER EXTREMITY;  Surgeon:  Elam Dutch, MD;  Location: [place] CV LAB;  Service: Cardiovascular;  Laterality: Bilateral;   ABDOMINAL AORTOGRAM W/LOWER EXTREMITY Bilateral 11/29/2019   Procedure: ABDOMINAL AORTOGRAM W/LOWER EXTREMITY;  Surgeon: Elam Dutch, MD;  Location: [place] CV LAB;  Service: Cardiovascular;  Laterality: Bilateral;   CATARACT EXTRACTION, BILATERAL     w IOL   CHOLECYSTECTOMY  1998   COLONOSCOPY     ENDARTERECTOMY FEMORAL Left 12/09/2019   Procedure: ENDARTERECTOMY FEMORAL with bovine patch angioplasty.;  Surgeon: Elam Dutch, MD;  Location: [place];  Service: Vascular;  Laterality: Left;   FEMORAL-POPLITEAL BYPASS GRAFT        x2 surgeries 1990's & 2009   FEMORAL-POPLITEAL BYPASS GRAFT Right 12/09/2019   Procedure: REDO RIGHT FEMORAL-POPLITEAL ARTERY BYPASS GRAFT;  Surgeon: Elam Dutch, MD;  Location: [place];  Service: Vascular;  Laterality: Right;   INJECTION KNEE Right Aug. 2016   Gel injection for pain   LUMBAR FUSION  07/06/2011   TONSILLECTOMY     as a teenager    TUBAL LIGATION      There were no vitals filed for this visit.   Subjective Assessment - 10/28/20 1022     Subjective I just got back from the beach so I'm feeling my back a little bit from  being out of my routine.    Pertinent History COPD, HTN, multiple vascular surgeries in legs, chronic LBP    Limitations Standing;Walking    How long can you stand comfortably? 3-4 minutes    How long can you walk comfortably? 3-4 minutes    Patient Stated Goals get legs stronger, walk longer, reduce LBP    Currently in Pain? Yes    Pain Score 3     Pain Location Back    Pain Orientation Lower    Pain Descriptors / Indicators Dull    Pain Type Chronic pain    Pain Onset More than a month ago    Pain Frequency Constant    Aggravating Factors  upon waking    Pain Relieving Factors sitting down                               [facility] Adult PT Treatment/Exercise - 10/28/20 0001        Exercises   Exercises Shoulder;Lumbar;Knee/Hip      Lumbar Exercises: Aerobic   Nustep L2 x 10', PT present to discuss progress      Lumbar Exercises: Standing   Row Strengthening;Both;Theraband;20 reps    Theraband Level (Row) Level 3 (Green)    Shoulder Extension Strengthening;Both;Theraband;20 reps    Theraband Level (Shoulder Extension) Level 3 (Green)      Knee/Hip Exercises: Stretches   Gastroc Stretch Right;30 seconds;Left    Gastroc Stretch Limitations rockerboard dynamic rocking stretch (had Rt calf charlie horse on vacation)      Knee/Hip Exercises: Standing   Rebounder weight shifting 3 ways x 1 min each.  Min use of UEs   on balance pad     Knee/Hip Exercises: Seated   Sit to Sand 2 sets;without UE support;10 reps   holding 5# kettlebell                Balance Exercises - 10/28/20 0001       Balance Exercises: Standing   Tandem Gait Forward;3 reps;Upper extremity support   along counter   Retro Gait Upper extremity support;3 reps   along counter   Sidestepping Upper extremity support;3 reps   PT close supervision, then along counter with UE support   Other Standing Exercises Comments box stepping x 4 reps each way, close supervision, difficulty with Lt LE leading                 PT Short Term Goals - 10/07/20 1057       PT SHORT TERM GOAL #2   Title improved upright posture in standing due to increased hip extension and increased glute strength    Time 4    Period Weeks    Status Achieved    Target Date 10/07/20               PT Long Term Goals - 10/15/20 1021       PT LONG TERM GOAL #1   Title independent with advanced HEP    Time 6    Period Weeks    Status On-going    Target Date 11/26/20      PT LONG TERM GOAL #2   Title improve FOTO to > or = to 49 to improve function    Baseline 53    Status Achieved      PT LONG TERM GOAL #3   Title improve LE endurance and strength to stand and walk  for 10-15 minutes without  significant limitation    Baseline 35-45 min    Status Achieved      PT LONG TERM GOAL #4   Title report a 60% reduction in LBP with ADLs and self-care    Baseline --    Time 6    Period Weeks    Status Revised    Target Date 11/26/20      PT LONG TERM GOAL #5   Title perform 5x sit to stand in < or = to 12 seconds to improve balance    Baseline 9.88    Status Achieved      Additional Long Term Goals   Additional Long Term Goals Yes      PT LONG TERM GOAL #6   Title improve ABC confidence scale score to > or = to 78%    Baseline 68.78% (1100 raw score)    Time 6    Period Weeks    Status New    Target Date 11/26/20                   Plan - 10/28/20 1043     Clinical Impression Statement Pt just got back from a beach vacation and reported some increased LBP 3/10 which she attributed to being out of her routine.  She was able to participate in all ther ex from previous sessions noting some increased fatigue with NuStep x 10' today.  She demos insecurity with box stepping today especially when leading with Lt LE, although was able to perform with only a single slight balance loss.  PT added sidestepping and backward walking along counter for home program after working on this within session with PT providing close supervision.  Good tolerance of session today.    Comorbidities lumbar surgery, multiple leg surgeries and weakness, COPD, age, 2 body parts    Rehab Potential Good    PT Frequency 1x / week    PT Duration 6 weeks    PT Treatment/Interventions ADLs/Self Care Home Management;Cryotherapy;Electrical Stimulation;Moist Heat;Traction;Stair training;Gait training;Therapeutic activities;Therapeutic exercise;Balance training;Neuromuscular re-education;Patient/family education;Manual techniques;Dry needling;Passive range of motion    PT Next Visit Plan strength, balance, core activation.  Pt will reduce to 1x/wk  next week, continue box stepping, gait along counter (tandem  fwd, retro gait, sidestepping)    PT Home Exercise Plan Access Code: [email or web address]    Consulted and Agree with Plan of Care Patient             Patient will benefit from skilled therapeutic intervention in order to improve the following deficits and impairments:     Visit Diagnosis: Chronic bilateral low back pain without sciatica  Cramp and spasm  Muscle weakness (generalized)  Other abnormalities of gait and mobility     Problem List Patient Active Problem List   Diagnosis Date Noted   Primary osteoarthritis of right knee 06/02/2020   Left groin hernia 05/22/2020   Sleep disturbance 01/09/2020   Acute lower UTI    Anxiety state    Peripheral edema    Leukocytosis    Debility 12/13/2019   Dyslipidemia    Rectal bleeding    Acute blood loss anemia    Ischemic colitis ([place])    Status post femoral-popliteal bypass surgery 12/09/2019   Secondary hypercoagulable state ([place]) 12/03/2019   Paroxysmal atrial fibrillation ([place]) 11/27/2019   Chronic bilateral low back pain with right-sided sciatica 08/29/2019   Chronic pain of right knee 08/29/2019   Abnormality of gait 05/30/2019  Neuropathic pain 05/30/2019   Myalgia 08/08/2018   Chronic pain syndrome 06/27/2018   Gross hematuria 06/01/2018   Obesity (BMI 30.0-34.9) 11/14/2017   Failed back syndrome 09/28/2017   CKD (chronic kidney disease), stage III ([place]) 08/07/2017   Unilateral primary osteoarthritis, right knee 04/07/2016   Acute gout of left foot 03/22/2016   Pain in right ankle and joints of right foot 08/28/2014   Trigeminal neuralgia    Encounter for therapeutic drug monitoring 10/21/2013   Syncope 12/29/2011   Chronic low back pain 09/13/2010   Allergic rhinitis due to pollen 06/25/2010   COPD mixed type ([place]) 01/22/2010   Solitary pulmonary nodule 01/12/2010   Insulin resistance 01/06/2009   Osteopenia 01/06/2009   Eczema 11/26/2007   Peripheral arterial disease ([place]) 06/29/2007   Hyperlipidemia  10/23/2006   Essential hypertension 10/23/2006   ESOPHAGEAL STRICTURE 08/09/1993    [name] [name], PT 10/28/20 11:07 AM   [facility] [address] [place], [name], [address] Phone: [phone]   Fax:  [phone]  Name: [name] [name] MRN: [id number] Date of Birth: 12-10-1924

## 2020-11-02 ENCOUNTER — Other Ambulatory Visit: Payer: Self-pay

## 2020-11-02 ENCOUNTER — Ambulatory Visit: Payer: Medicare Other | Admitting: Physical Therapy

## 2020-11-02 ENCOUNTER — Encounter: Payer: Self-pay | Admitting: Physical Therapy

## 2020-11-02 DIAGNOSIS — G8929 Other chronic pain: Secondary | ICD-10-CM | POA: Diagnosis not present

## 2020-11-02 DIAGNOSIS — M545 Low back pain, unspecified: Secondary | ICD-10-CM | POA: Diagnosis not present

## 2020-11-02 DIAGNOSIS — R2689 Other abnormalities of gait and mobility: Secondary | ICD-10-CM | POA: Diagnosis not present

## 2020-11-02 DIAGNOSIS — R252 Cramp and spasm: Secondary | ICD-10-CM | POA: Diagnosis not present

## 2020-11-02 DIAGNOSIS — M6281 Muscle weakness (generalized): Secondary | ICD-10-CM | POA: Diagnosis not present

## 2020-11-02 NOTE — Therapy (Signed)
[facility] [address] [place], [name], [address] Phone: [phone]   Fax:  [phone]  Physical Therapy Treatment  Patient Details  Name: [name] [name] MRN: [id number] Date of Birth: [birth year] Referring Provider (PT): Delice Lesch, MD   Encounter Date: 11/02/2020   PT End of Session - 11/02/20 1235     Visit Number 14    Date for PT Re-Evaluation 11/26/20    Authorization Type Medicare/BCBS    Progress Note Due on Visit 20    PT Start Time 1231    PT Stop Time 1310    PT Time Calculation (min) 39 min    Activity Tolerance Patient tolerated treatment well    Behavior During Therapy [facility] for tasks assessed/performed             Past Medical History:  Diagnosis Date   Allergic rhinoconjunctivitis    Allergy    Anxiety    pt. managed- uses deep breathing    Arthritis    low back , stenosis   COLONIC POLYPS, RECURRENT 08/29/2006   2008 last colonoscopy. No further colonoscopy.      COPD (chronic obstructive pulmonary disease) (HCC)    Diverticulosis    Dysrhythmia    afib   Eczema    Environmental allergies    allergy shot- q friday in Dr. Janee Morn office. PFT's abnormal- recommended Spiriva to use preop & will d/c after surgery   GERD (gastroesophageal reflux disease)    Hiatal hernia    Hyperlipidemia    Hypertension    Lung nodule 2011   Paroxysmal atrial fibrillation ([place]) 11/27/2019   Peripheral vascular disease (HCC)    Shingles    Stenosis of popliteal artery (HCC)    blood clots in legs long ago     Trigeminal neuralgia    Left buttocks    Past Surgical History:  Procedure Laterality Date   ABDOMINAL AORTAGRAM N/A 06/17/2011   Procedure: ABDOMINAL Maxcine Ham;  Surgeon: Elam Dutch, MD;  Location: [facility] CATH LAB;  Service: Cardiovascular;  Laterality: N/A;   ABDOMINAL AORTOGRAM W/LOWER EXTREMITY Bilateral 06/22/2018   Procedure: ABDOMINAL AORTOGRAM W/LOWER EXTREMITY;  Surgeon:  Elam Dutch, MD;  Location: [place] CV LAB;  Service: Cardiovascular;  Laterality: Bilateral;   ABDOMINAL AORTOGRAM W/LOWER EXTREMITY Bilateral 11/29/2019   Procedure: ABDOMINAL AORTOGRAM W/LOWER EXTREMITY;  Surgeon: Elam Dutch, MD;  Location: [place] CV LAB;  Service: Cardiovascular;  Laterality: Bilateral;   CATARACT EXTRACTION, BILATERAL     w IOL   CHOLECYSTECTOMY  1998   COLONOSCOPY     ENDARTERECTOMY FEMORAL Left 12/09/2019   Procedure: ENDARTERECTOMY FEMORAL with bovine patch angioplasty.;  Surgeon: Elam Dutch, MD;  Location: [place];  Service: Vascular;  Laterality: Left;   FEMORAL-POPLITEAL BYPASS GRAFT        x2 surgeries 1990's & 2009   FEMORAL-POPLITEAL BYPASS GRAFT Right 12/09/2019   Procedure: REDO RIGHT FEMORAL-POPLITEAL ARTERY BYPASS GRAFT;  Surgeon: Elam Dutch, MD;  Location: [place];  Service: Vascular;  Laterality: Right;   INJECTION KNEE Right Aug. 2016   Gel injection for pain   LUMBAR FUSION  07/06/2011   TONSILLECTOMY     as a teenager    TUBAL LIGATION      There were no vitals filed for this visit.   Subjective Assessment - 11/02/20 1237     Subjective I am doing ok. No complaints really.    Pertinent History COPD, HTN, multiple vascular  surgeries in legs, chronic LBP    Currently in Pain? Yes    Pain Score 3     Pain Location Back    Pain Orientation Lower    Pain Descriptors / Indicators Dull;Aching    Aggravating Factors  upon waking    Pain Relieving Factors Sitting    Multiple Pain Sites No                               [facility] Adult PT Treatment/Exercise - 11/02/20 0001       Lumbar Exercises: Aerobic   Nustep L2 x 10', PTA present to discuss progress      Lumbar Exercises: Seated   Long Arc Quad on Chair Strengthening;Both;1 set;10 reps;Weights    LAQ on Chair Weights (lbs) 2    LAQ on Chair Limitations performed on sit fit for core    Other Seated Lumbar Exercises Sit fit pelvic rocks 20x2  in  each direction.TC to keep pt from moving trunk > pelvis   Added pelvic circles 10x each direction: feet on black square: Pt had hard time getting pelvic movements today, very stiff     Knee/Hip Exercises: Standing   Hip Flexion --   marching on mini tramp x1 min   Rocker Board 3 minutes    Rebounder weight shifting 3 ways x 1 min each.  Min use of UEs   on balance pad     Knee/Hip Exercises: Seated   Sit to Sand 2 sets;5 reps;without UE support   holding 6# wt, VC to contract core more                     PT Short Term Goals - 10/07/20 1057       PT SHORT TERM GOAL #2   Title improved upright posture in standing due to increased hip extension and increased glute strength    Time 4    Period Weeks    Status Achieved    Target Date 10/07/20               PT Long Term Goals - 10/15/20 1021       PT LONG TERM GOAL #1   Title independent with advanced HEP    Time 6    Period Weeks    Status On-going    Target Date 11/26/20      PT LONG TERM GOAL #2   Title improve FOTO to > or = to 49 to improve function    Baseline 53    Status Achieved      PT LONG TERM GOAL #3   Title improve LE endurance and strength to stand and walk for 10-15 minutes without significant limitation    Baseline 35-45 min    Status Achieved      PT LONG TERM GOAL #4   Title report a 60% reduction in LBP with ADLs and self-care    Baseline --    Time 6    Period Weeks    Status Revised    Target Date 11/26/20      PT LONG TERM GOAL #5   Title perform 5x sit to stand in < or = to 12 seconds to improve balance    Baseline 9.88    Status Achieved      Additional Long Term Goals   Additional Long Term Goals Yes      PT LONG TERM  GOAL #6   Title improve ABC confidence scale score to > or = to 78%    Baseline 68.78% (1100 raw score)    Time 6    Period Weeks    Status New    Target Date 11/26/20                   Plan - 11/02/20 1236     Clinical Impression  Statement Pt arrived with mild LBP today but definitely had an increase with todays exercises, most of them were exercises she has had success before. " I think I just slept wrong last night." Lumbar appears stiff today, unable to get much pelvic movement with the sit fit today. Pt plans on going hom and putting heat on her back, then do some stretches later.    Personal Factors and Comorbidities Comorbidity 3+    Comorbidities lumbar surgery, multiple leg surgeries and weakness, COPD, age, 2 body parts    Examination-Activity Limitations Locomotion Level;Stand;Stairs    Examination-Participation Restrictions Community Activity;Shop    Stability/Clinical Decision Making Evolving/Moderate complexity    Rehab Potential Good    PT Frequency 1x / week    PT Duration 6 weeks    PT Treatment/Interventions ADLs/Self Care Home Management;Cryotherapy;Electrical Stimulation;Moist Heat;Traction;Stair training;Gait training;Therapeutic activities;Therapeutic exercise;Balance training;Neuromuscular re-education;Patient/family education;Manual techniques;Dry needling;Passive range of motion    PT Next Visit Plan Take a look at HEP and see if there is anything to add or modify    PT Home Exercise Plan Access Code: [email or web address]    Consulted and Agree with Plan of Care Patient             Patient will benefit from skilled therapeutic intervention in order to improve the following deficits and impairments:  Abnormal gait, Decreased activity tolerance, Decreased strength, Impaired flexibility, Postural dysfunction, Pain, Decreased range of motion, Difficulty walking, Decreased endurance, Decreased balance  Visit Diagnosis: Chronic bilateral low back pain without sciatica  Cramp and spasm  Muscle weakness (generalized)  Other abnormalities of gait and mobility     Problem List Patient Active Problem List   Diagnosis Date Noted   Primary osteoarthritis of right knee 06/02/2020   Left groin hernia  05/22/2020   Sleep disturbance 01/09/2020   Acute lower UTI    Anxiety state    Peripheral edema    Leukocytosis    Debility 12/13/2019   Dyslipidemia    Rectal bleeding    Acute blood loss anemia    Ischemic colitis ([place])    Status post femoral-popliteal bypass surgery 12/09/2019   Secondary hypercoagulable state ([place]) 12/03/2019   Paroxysmal atrial fibrillation (HCC) 11/27/2019   Chronic bilateral low back pain with right-sided sciatica 08/29/2019   Chronic pain of right knee 08/29/2019   Abnormality of gait 05/30/2019   Neuropathic pain 05/30/2019   Myalgia 08/08/2018   Chronic pain syndrome 06/27/2018   Gross hematuria 06/01/2018   Obesity (BMI 30.0-34.9) 11/14/2017   Failed back syndrome 09/28/2017   CKD (chronic kidney disease), stage III ([place]) 08/07/2017   Unilateral primary osteoarthritis, right knee 04/07/2016   Acute gout of left foot 03/22/2016   Pain in right ankle and joints of right foot 08/28/2014   Trigeminal neuralgia    Encounter for therapeutic drug monitoring 10/21/2013   Syncope 12/29/2011   Chronic low back pain 09/13/2010   Allergic rhinitis due to pollen 06/25/2010   COPD mixed type ([place]) 01/22/2010   Solitary pulmonary nodule 01/12/2010   Insulin resistance 01/06/2009   Osteopenia  01/06/2009   Eczema 11/26/2007   Peripheral arterial disease ([place]) 06/29/2007   Hyperlipidemia 10/23/2006   Essential hypertension 10/23/2006   ESOPHAGEAL STRICTURE 08/09/1993    Hadden Steig, PTA 11/02/2020, 1:14 PM  [facility] [address], [place], [name], [address] Phone: [phone]   Fax:  [phone]  Name: [name] [name] MRN: [id number] Date of Birth: January 15, 1925

## 2020-11-04 ENCOUNTER — Encounter: Payer: Medicare Other | Admitting: Physical Therapy

## 2020-11-09 ENCOUNTER — Encounter: Payer: Medicare Other | Admitting: Physical Therapy

## 2020-11-10 ENCOUNTER — Institutional Professional Consult (permissible substitution): Payer: Medicare Other | Admitting: Pulmonary Disease

## 2020-11-11 ENCOUNTER — Ambulatory Visit: Payer: Medicare Other

## 2020-11-11 ENCOUNTER — Other Ambulatory Visit: Payer: Self-pay

## 2020-11-11 ENCOUNTER — Encounter: Payer: Medicare Other | Admitting: Physical Therapy

## 2020-11-11 DIAGNOSIS — R2689 Other abnormalities of gait and mobility: Secondary | ICD-10-CM | POA: Diagnosis not present

## 2020-11-11 DIAGNOSIS — M6281 Muscle weakness (generalized): Secondary | ICD-10-CM

## 2020-11-11 DIAGNOSIS — G8929 Other chronic pain: Secondary | ICD-10-CM

## 2020-11-11 DIAGNOSIS — R252 Cramp and spasm: Secondary | ICD-10-CM

## 2020-11-11 DIAGNOSIS — M545 Low back pain, unspecified: Secondary | ICD-10-CM | POA: Diagnosis not present

## 2020-11-11 NOTE — Therapy (Signed)
[name] [address] [name], [name], [address] Phone: [phone]   Fax:  [phone]  Physical Therapy Treatment  Patient Details  Name: [name] [name] MRN: [id number] Date of Birth: July 21, 1924 Referring Provider (PT): Delice Lesch, MD   Encounter Date: 11/11/2020   PT End of Session - 11/11/20 1138     Visit Number 15    Date for PT Re-Evaluation 11/26/20    Authorization Type Medicare/BCBS    Progress Note Due on Visit 20    PT Start Time 1102    PT Stop Time 1142    PT Time Calculation (min) 40 min    Activity Tolerance Patient tolerated treatment well    Behavior During Therapy [facility] for tasks assessed/performed             Past Medical History:  Diagnosis Date   Allergic rhinoconjunctivitis    Allergy    Anxiety    pt. managed- uses deep breathing    Arthritis    low back , stenosis   COLONIC POLYPS, RECURRENT 08/29/2006   2008 last colonoscopy. No further colonoscopy.      COPD (chronic obstructive pulmonary disease) (HCC)    Diverticulosis    Dysrhythmia    afib   Eczema    Environmental allergies    allergy shot- q friday in Dr. Janee Morn office. PFT's abnormal- recommended Spiriva to use preop & will d/c after surgery   GERD (gastroesophageal reflux disease)    Hiatal hernia    Hyperlipidemia    Hypertension    Lung nodule 2011   Paroxysmal atrial fibrillation ([place]) 11/27/2019   Peripheral vascular disease (HCC)    Shingles    Stenosis of popliteal artery (HCC)    blood clots in legs long ago     Trigeminal neuralgia    Left buttocks    Past Surgical History:  Procedure Laterality Date   ABDOMINAL AORTAGRAM N/A 06/17/2011   Procedure: ABDOMINAL Maxcine Ham;  Surgeon: Elam Dutch, MD;  Location: [facility] CATH LAB;  Service: Cardiovascular;  Laterality: N/A;   ABDOMINAL AORTOGRAM W/LOWER EXTREMITY Bilateral 06/22/2018   Procedure: ABDOMINAL AORTOGRAM W/LOWER EXTREMITY;  Surgeon:  Elam Dutch, MD;  Location: [place] CV LAB;  Service: Cardiovascular;  Laterality: Bilateral;   ABDOMINAL AORTOGRAM W/LOWER EXTREMITY Bilateral 11/29/2019   Procedure: ABDOMINAL AORTOGRAM W/LOWER EXTREMITY;  Surgeon: Elam Dutch, MD;  Location: [place] CV LAB;  Service: Cardiovascular;  Laterality: Bilateral;   CATARACT EXTRACTION, BILATERAL     w IOL   CHOLECYSTECTOMY  1998   COLONOSCOPY     ENDARTERECTOMY FEMORAL Left 12/09/2019   Procedure: ENDARTERECTOMY FEMORAL with bovine patch angioplasty.;  Surgeon: Elam Dutch, MD;  Location: [place];  Service: Vascular;  Laterality: Left;   FEMORAL-POPLITEAL BYPASS GRAFT        x2 surgeries 1990's & 2009   FEMORAL-POPLITEAL BYPASS GRAFT Right 12/09/2019   Procedure: REDO RIGHT FEMORAL-POPLITEAL ARTERY BYPASS GRAFT;  Surgeon: Elam Dutch, MD;  Location: [place];  Service: Vascular;  Laterality: Right;   INJECTION KNEE Right Aug. 2016   Gel injection for pain   LUMBAR FUSION  07/06/2011   TONSILLECTOMY     as a teenager    TUBAL LIGATION      There were no vitals filed for this visit.   Subjective Assessment - 11/11/20 1105     Subjective I still wake up with my back ache but I think it is just going to  be that way.    Patient Stated Goals get legs stronger, walk longer, reduce LBP    Currently in Pain? Yes    Pain Score 4     Pain Location Back    Pain Orientation Lower    Pain Descriptors / Indicators Aching;Dull    Pain Type Chronic pain    Pain Onset More than a month ago    Pain Frequency Constant    Aggravating Factors  early morning, activity                               [facility] Adult PT Treatment/Exercise - 11/11/20 0001       Lumbar Exercises: Aerobic   Nustep L2 x 10', PT present to discuss progress      Lumbar Exercises: Standing   Row Strengthening;Both;Theraband;20 reps    Theraband Level (Row) Level 3 (Green)    Shoulder Extension Strengthening;Both;Theraband;20 reps     Theraband Level (Shoulder Extension) Level 3 (Green)      Lumbar Exercises: Seated   Long Arc Quad on Chair Strengthening;Both;1 set;10 reps;Weights    LAQ on Chair Weights (lbs) 2      Knee/Hip Exercises: Clinical research associate Right;30 seconds;Left    Gastroc Stretch Limitations rockerboard dynamic rocking stretch (had Rt calf charlie horse on vacation)      Knee/Hip Exercises: Standing   Rocker Board 3 minutes    Rebounder weight shifting 3 ways x 1 min each.  Min use of UEs   on balance pad     Knee/Hip Exercises: Seated   Sit to Sand 2 sets;5 reps;without UE support   holding 6# wt, VC to contract core more                     PT Short Term Goals - 10/07/20 1057       PT SHORT TERM GOAL #2   Title improved upright posture in standing due to increased hip extension and increased glute strength    Time 4    Period Weeks    Status Achieved    Target Date 10/07/20               PT Long Term Goals - 10/15/20 1021       PT LONG TERM GOAL #1   Title independent with advanced HEP    Time 6    Period Weeks    Status On-going    Target Date 11/26/20      PT LONG TERM GOAL #2   Title improve FOTO to > or = to 49 to improve function    Baseline 53    Status Achieved      PT LONG TERM GOAL #3   Title improve LE endurance and strength to stand and walk for 10-15 minutes without significant limitation    Baseline 35-45 min    Status Achieved      PT LONG TERM GOAL #4   Title report a 60% reduction in LBP with ADLs and self-care    Baseline --    Time 6    Period Weeks    Status Revised    Target Date 11/26/20      PT LONG TERM GOAL #5   Title perform 5x sit to stand in < or = to 12 seconds to improve balance    Baseline 9.88    Status Achieved  Additional Long Term Goals   Additional Long Term Goals Yes      PT LONG TERM GOAL #6   Title improve ABC confidence scale score to > or = to 78%    Baseline 68.78% (1100 raw score)    Time  6    Period Weeks    Status New    Target Date 11/26/20                   Plan - 11/11/20 1136     Clinical Impression Statement Pt continues to report 3-4/10 LBP aching pain that is consistent.   Pt is able to participate in all PT activities today and reports that she feels better post-session due to muscles being more relaxed.  Pt did well with all exercises and required only minor verbal cueing for technique.  Pt with improved balance overall and requires reduced guarding throughout session.  Pt will continue to benefit from skilled PT to address chronic LBP and balance deficits.    PT Frequency 1x / week    PT Duration 6 weeks    PT Treatment/Interventions ADLs/Self Care Home Management;Cryotherapy;Electrical Stimulation;Moist Heat;Traction;Stair training;Gait training;Therapeutic activities;Therapeutic exercise;Balance training;Neuromuscular re-education;Patient/family education;Manual techniques;Dry needling;Passive range of motion    PT Next Visit Plan continue balance, strength and flexibility    Consulted and Agree with Plan of Care Patient             Patient will benefit from skilled therapeutic intervention in order to improve the following deficits and impairments:  Abnormal gait, Decreased activity tolerance, Decreased strength, Impaired flexibility, Postural dysfunction, Pain, Decreased range of motion, Difficulty walking, Decreased endurance, Decreased balance  Visit Diagnosis: Chronic bilateral low back pain without sciatica  Cramp and spasm  Muscle weakness (generalized)  Other abnormalities of gait and mobility     Problem List Patient Active Problem List   Diagnosis Date Noted   Primary osteoarthritis of right knee 06/02/2020   Left groin hernia 05/22/2020   Sleep disturbance 01/09/2020   Acute lower UTI    Anxiety state    Peripheral edema    Leukocytosis    Debility 12/13/2019   Dyslipidemia    Rectal bleeding    Acute blood loss anemia     Ischemic colitis ([place])    Status post femoral-popliteal bypass surgery 12/09/2019   Secondary hypercoagulable state ([place]) 12/03/2019   Paroxysmal atrial fibrillation (HCC) 11/27/2019   Chronic bilateral low back pain with right-sided sciatica 08/29/2019   Chronic pain of right knee 08/29/2019   Abnormality of gait 05/30/2019   Neuropathic pain 05/30/2019   Myalgia 08/08/2018   Chronic pain syndrome 06/27/2018   Gross hematuria 06/01/2018   Obesity (BMI 30.0-34.9) 11/14/2017   Failed back syndrome 09/28/2017   CKD (chronic kidney disease), stage III ([place]) 08/07/2017   Unilateral primary osteoarthritis, right knee 04/07/2016   Acute gout of left foot 03/22/2016   Pain in right ankle and joints of right foot 08/28/2014   Trigeminal neuralgia    Encounter for therapeutic drug monitoring 10/21/2013   Syncope 12/29/2011   Chronic low back pain 09/13/2010   Allergic rhinitis due to pollen 06/25/2010   COPD mixed type ([place]) 01/22/2010   Solitary pulmonary nodule 01/12/2010   Insulin resistance 01/06/2009   Osteopenia 01/06/2009   Eczema 11/26/2007   Peripheral arterial disease ([place]) 06/29/2007   Hyperlipidemia 10/23/2006   Essential hypertension 10/23/2006   ESOPHAGEAL STRICTURE 08/09/1993     Sigurd Sos, PT 11/11/20 11:39 AM   [facility] [address], [place], [name], [address] Phone: [phone]   Fax:  [phone]  Name: [name] [name] MRN: [id number] Date of Birth: 1925/01/24

## 2020-11-18 ENCOUNTER — Other Ambulatory Visit: Payer: Self-pay

## 2020-11-18 ENCOUNTER — Ambulatory Visit: Payer: Medicare Other

## 2020-11-18 DIAGNOSIS — M545 Low back pain, unspecified: Secondary | ICD-10-CM | POA: Diagnosis not present

## 2020-11-18 DIAGNOSIS — G8929 Other chronic pain: Secondary | ICD-10-CM | POA: Diagnosis not present

## 2020-11-18 DIAGNOSIS — R252 Cramp and spasm: Secondary | ICD-10-CM

## 2020-11-18 DIAGNOSIS — R2689 Other abnormalities of gait and mobility: Secondary | ICD-10-CM

## 2020-11-18 DIAGNOSIS — M6281 Muscle weakness (generalized): Secondary | ICD-10-CM | POA: Diagnosis not present

## 2020-11-18 NOTE — Therapy (Signed)
[facility] [address] [place], [name], [address] Phone: [phone]   Fax:  [phone]  Physical Therapy Treatment  Patient Details  Name: [name] [name] MRN: [id number] Date of Birth: 1925-03-06 Referring Provider (PT): Delice Lesch, MD   Encounter Date: 11/18/2020   PT End of Session - 11/18/20 1059     Visit Number 16    Date for PT Re-Evaluation 11/26/20    Authorization Type Medicare/BCBS    Progress Note Due on Visit 20    PT Start Time 1016    PT Stop Time 1059    PT Time Calculation (min) 43 min    Activity Tolerance Patient tolerated treatment well    Behavior During Therapy [facility] for tasks assessed/performed             Past Medical History:  Diagnosis Date   Allergic rhinoconjunctivitis    Allergy    Anxiety    pt. managed- uses deep breathing    Arthritis    low back , stenosis   COLONIC POLYPS, RECURRENT 08/29/2006   2008 last colonoscopy. No further colonoscopy.      COPD (chronic obstructive pulmonary disease) (HCC)    Diverticulosis    Dysrhythmia    afib   Eczema    Environmental allergies    allergy shot- q friday in Dr. Janee Morn office. PFT's abnormal- recommended Spiriva to use preop & will d/c after surgery   GERD (gastroesophageal reflux disease)    Hiatal hernia    Hyperlipidemia    Hypertension    Lung nodule 2011   Paroxysmal atrial fibrillation ([place]) 11/27/2019   Peripheral vascular disease (HCC)    Shingles    Stenosis of popliteal artery (HCC)    blood clots in legs long ago     Trigeminal neuralgia    Left buttocks    Past Surgical History:  Procedure Laterality Date   ABDOMINAL AORTAGRAM N/A 06/17/2011   Procedure: ABDOMINAL Maxcine Ham;  Surgeon: Elam Dutch, MD;  Location: [facility] CATH LAB;  Service: Cardiovascular;  Laterality: N/A;   ABDOMINAL AORTOGRAM W/LOWER EXTREMITY Bilateral 06/22/2018   Procedure: ABDOMINAL AORTOGRAM W/LOWER EXTREMITY;  Surgeon:  Elam Dutch, MD;  Location: [place] CV LAB;  Service: Cardiovascular;  Laterality: Bilateral;   ABDOMINAL AORTOGRAM W/LOWER EXTREMITY Bilateral 11/29/2019   Procedure: ABDOMINAL AORTOGRAM W/LOWER EXTREMITY;  Surgeon: Elam Dutch, MD;  Location: [place] CV LAB;  Service: Cardiovascular;  Laterality: Bilateral;   CATARACT EXTRACTION, BILATERAL     w IOL   CHOLECYSTECTOMY  1998   COLONOSCOPY     ENDARTERECTOMY FEMORAL Left 12/09/2019   Procedure: ENDARTERECTOMY FEMORAL with bovine patch angioplasty.;  Surgeon: Elam Dutch, MD;  Location: [place];  Service: Vascular;  Laterality: Left;   FEMORAL-POPLITEAL BYPASS GRAFT        x2 surgeries 1990's & 2009   FEMORAL-POPLITEAL BYPASS GRAFT Right 12/09/2019   Procedure: REDO RIGHT FEMORAL-POPLITEAL ARTERY BYPASS GRAFT;  Surgeon: Elam Dutch, MD;  Location: [place];  Service: Vascular;  Laterality: Right;   INJECTION KNEE Right Aug. 2016   Gel injection for pain   LUMBAR FUSION  07/06/2011   TONSILLECTOMY     as a teenager    TUBAL LIGATION      There were no vitals filed for this visit.   Subjective Assessment - 11/18/20 1021     Subjective I've been having my usual back pain but not bad.    Currently in Pain?  Yes    Pain Score 2     Pain Location Back    Pain Orientation Lower    Pain Descriptors / Indicators Aching;Dull    Pain Type Chronic pain    Pain Onset More than a month ago    Pain Frequency Constant    Aggravating Factors  early morning, activity    Pain Relieving Factors sitting, rest breaks                               [facility] Adult PT Treatment/Exercise - 11/18/20 0001       Lumbar Exercises: Aerobic   Nustep L2 x 10', PT present to discuss progress      Lumbar Exercises: Standing   Row Strengthening;Both;Theraband;20 reps    Theraband Level (Row) Level 3 (Green)    Row Limitations standing on balance pad- CGA by PT for safety    Shoulder Extension  Strengthening;Both;Theraband;20 reps    Theraband Level (Shoulder Extension) Level 3 (Green)    Shoulder Extension Limitations standing on balance pad- CGA by PT for safety      Lumbar Exercises: Seated   Long Arc Quad on Chair Strengthening;Both;1 set;10 reps;Weights    LAQ on Chair Weights (lbs) 2      Knee/Hip Exercises: Standing   Hip Flexion Stengthening;Knee bent;Both;2 sets;10 reps    Hip Flexion Limitations standing on balance pad 2# at ankles , UE support    Rocker Board 3 minutes      Knee/Hip Exercises: Seated   Sit to Sand 2 sets;5 reps;without UE support   holding 6# wt, VC to improve lumbar alignment                Balance Exercises - 11/18/20 0001       Balance Exercises: Standing   Step Over Hurdles / Cones sidestepping using treadmill handle for UE support as needed.    Other Standing Exercises Comments box stepping x 4 reps each way, close supervision, alternating step taps on low cones with side stepping in between.                 PT Short Term Goals - 10/07/20 1057       PT SHORT TERM GOAL #2   Title improved upright posture in standing due to increased hip extension and increased glute strength    Time 4    Period Weeks    Status Achieved    Target Date 10/07/20               PT Long Term Goals - 10/15/20 1021       PT LONG TERM GOAL #1   Title independent with advanced HEP    Time 6    Period Weeks    Status On-going    Target Date 11/26/20      PT LONG TERM GOAL #2   Title improve FOTO to > or = to 49 to improve function    Baseline 53    Status Achieved      PT LONG TERM GOAL #3   Title improve LE endurance and strength to stand and walk for 10-15 minutes without significant limitation    Baseline 35-45 min    Status Achieved      PT LONG TERM GOAL #4   Title report a 60% reduction in LBP with ADLs and self-care    Baseline --    Time 6  Period Weeks    Status Revised    Target Date 11/26/20      PT LONG  TERM GOAL #5   Title perform 5x sit to stand in < or = to 12 seconds to improve balance    Baseline 9.88    Status Achieved      Additional Long Term Goals   Additional Long Term Goals Yes      PT LONG TERM GOAL #6   Title improve ABC confidence scale score to > or = to 78%    Baseline 68.78% (1100 raw score)    Time 6    Period Weeks    Status New    Target Date 11/26/20                   Plan - 11/18/20 1029     Clinical Impression Statement Pt continues to report 2-4/10 LBP aching pain that is consistent.   Pt is able to participate in all PT activities today and reports that she feels better post-session due to muscles being more relaxed.  Pt reports that she feels 50% more steady in the community with ambulation and on unlevel surfaces such as grass.  Pt did well with all exercises and required only minor verbal cueing for technique.  Pt with improved balance overall and requires reduced guarding throughout session.  Pt will continue to benefit from skilled PT to address chronic LBP and balance deficits.    PT Frequency 1x / week    PT Duration 6 weeks    PT Treatment/Interventions ADLs/Self Care Home Management;Cryotherapy;Electrical Stimulation;Moist Heat;Traction;Stair training;Gait training;Therapeutic activities;Therapeutic exercise;Balance training;Neuromuscular re-education;Patient/family education;Manual techniques;Dry needling;Passive range of motion    PT Next Visit Plan continue balance, strength and flexibility    PT Home Exercise Plan Access Code: [email or web address]    Consulted and Agree with Plan of Care Patient             Patient will benefit from skilled therapeutic intervention in order to improve the following deficits and impairments:  Abnormal gait, Decreased activity tolerance, Decreased strength, Impaired flexibility, Postural dysfunction, Pain, Decreased range of motion, Difficulty walking, Decreased endurance, Decreased balance  Visit  Diagnosis: Chronic bilateral low back pain without sciatica  Cramp and spasm  Muscle weakness (generalized)  Other abnormalities of gait and mobility     Problem List Patient Active Problem List   Diagnosis Date Noted   Primary osteoarthritis of right knee 06/02/2020   Left groin hernia 05/22/2020   Sleep disturbance 01/09/2020   Acute lower UTI    Anxiety state    Peripheral edema    Leukocytosis    Debility 12/13/2019   Dyslipidemia    Rectal bleeding    Acute blood loss anemia    Ischemic colitis ([place])    Status post femoral-popliteal bypass surgery 12/09/2019   Secondary hypercoagulable state ([place]) 12/03/2019   Paroxysmal atrial fibrillation (HCC) 11/27/2019   Chronic bilateral low back pain with right-sided sciatica 08/29/2019   Chronic pain of right knee 08/29/2019   Abnormality of gait 05/30/2019   Neuropathic pain 05/30/2019   Myalgia 08/08/2018   Chronic pain syndrome 06/27/2018   Gross hematuria 06/01/2018   Obesity (BMI 30.0-34.9) 11/14/2017   Failed back syndrome 09/28/2017   CKD (chronic kidney disease), stage III ([place]) 08/07/2017   Unilateral primary osteoarthritis, right knee 04/07/2016   Acute gout of left foot 03/22/2016   Pain in right ankle and joints of right foot 08/28/2014   Trigeminal neuralgia  Encounter for therapeutic drug monitoring 10/21/2013   Syncope 12/29/2011   Chronic low back pain 09/13/2010   Allergic rhinitis due to pollen 06/25/2010   COPD mixed type ([place]) 01/22/2010   Solitary pulmonary nodule 01/12/2010   Insulin resistance 01/06/2009   Osteopenia 01/06/2009   Eczema 11/26/2007   Peripheral arterial disease ([place]) 06/29/2007   Hyperlipidemia 10/23/2006   Essential hypertension 10/23/2006   ESOPHAGEAL STRICTURE 08/09/1993    Sigurd Sos, PT 11/18/20 11:02 AM  [facility] [address] [place], [name], [address] Phone: [phone]   Fax:   [phone]  Name: [name] [name] MRN: [id number] Date of Birth: 29-Sep-1924

## 2020-11-25 ENCOUNTER — Other Ambulatory Visit: Payer: Self-pay

## 2020-11-25 ENCOUNTER — Ambulatory Visit: Payer: Medicare Other | Attending: Physical Medicine & Rehabilitation

## 2020-11-25 DIAGNOSIS — G8929 Other chronic pain: Secondary | ICD-10-CM | POA: Diagnosis not present

## 2020-11-25 DIAGNOSIS — R2689 Other abnormalities of gait and mobility: Secondary | ICD-10-CM | POA: Diagnosis not present

## 2020-11-25 DIAGNOSIS — R252 Cramp and spasm: Secondary | ICD-10-CM | POA: Diagnosis not present

## 2020-11-25 DIAGNOSIS — M6281 Muscle weakness (generalized): Secondary | ICD-10-CM | POA: Diagnosis not present

## 2020-11-25 DIAGNOSIS — M545 Low back pain, unspecified: Secondary | ICD-10-CM | POA: Diagnosis not present

## 2020-11-25 NOTE — Therapy (Signed)
[facility] [address], [place], [name], [address] Phone: [phone]   Fax:  [phone]  Physical Therapy Treatment  Patient Details  Name: [name] [name] MRN: [id number] Date of Birth: 08-16-1924 Referring Provider (PT): Delice Lesch, MD   Encounter Date: 11/25/2020   PT End of Session - 11/25/20 1054     Visit Number 17    Authorization Type Medicare/BCBS    PT Start Time 1017    PT Stop Time 1055    PT Time Calculation (min) 38 min    Activity Tolerance Patient tolerated treatment well    Behavior During Therapy [facility] for tasks assessed/performed             Past Medical History:  Diagnosis Date   Allergic rhinoconjunctivitis    Allergy    Anxiety    pt. managed- uses deep breathing    Arthritis    low back , stenosis   COLONIC POLYPS, RECURRENT 08/29/2006   2008 last colonoscopy. No further colonoscopy.      COPD (chronic obstructive pulmonary disease) (HCC)    Diverticulosis    Dysrhythmia    afib   Eczema    Environmental allergies    allergy shot- q friday in Dr. Janee Morn office. PFT's abnormal- recommended Spiriva to use preop & will d/c after surgery   GERD (gastroesophageal reflux disease)    Hiatal hernia    Hyperlipidemia    Hypertension    Lung nodule 2011   Paroxysmal atrial fibrillation ([place]) 11/27/2019   Peripheral vascular disease (HCC)    Shingles    Stenosis of popliteal artery (HCC)    blood clots in legs long ago     Trigeminal neuralgia    Left buttocks    Past Surgical History:  Procedure Laterality Date   ABDOMINAL AORTAGRAM N/A 06/17/2011   Procedure: ABDOMINAL Maxcine Ham;  Surgeon: Elam Dutch, MD;  Location: [facility] CATH LAB;  Service: Cardiovascular;  Laterality: N/A;   ABDOMINAL AORTOGRAM W/LOWER EXTREMITY Bilateral 06/22/2018   Procedure: ABDOMINAL AORTOGRAM W/LOWER EXTREMITY;  Surgeon: Elam Dutch, MD;  Location: [place] CV LAB;  Service: Cardiovascular;   Laterality: Bilateral;   ABDOMINAL AORTOGRAM W/LOWER EXTREMITY Bilateral 11/29/2019   Procedure: ABDOMINAL AORTOGRAM W/LOWER EXTREMITY;  Surgeon: Elam Dutch, MD;  Location: [place] CV LAB;  Service: Cardiovascular;  Laterality: Bilateral;   CATARACT EXTRACTION, BILATERAL     w IOL   CHOLECYSTECTOMY  1998   COLONOSCOPY     ENDARTERECTOMY FEMORAL Left 12/09/2019   Procedure: ENDARTERECTOMY FEMORAL with bovine patch angioplasty.;  Surgeon: Elam Dutch, MD;  Location: [place];  Service: Vascular;  Laterality: Left;   FEMORAL-POPLITEAL BYPASS GRAFT        x2 surgeries 1990's & 2009   FEMORAL-POPLITEAL BYPASS GRAFT Right 12/09/2019   Procedure: REDO RIGHT FEMORAL-POPLITEAL ARTERY BYPASS GRAFT;  Surgeon: Elam Dutch, MD;  Location: [place];  Service: Vascular;  Laterality: Right;   INJECTION KNEE Right Aug. 2016   Gel injection for pain   LUMBAR FUSION  07/06/2011   TONSILLECTOMY     as a teenager    TUBAL LIGATION      There were no vitals filed for this visit.   Subjective Assessment - 11/25/20 1016     Subjective I am ready to D/C.  My back pain is always worse in the mornings.  It gets better as I move around.  My back feels 10% better, my balance feels 80%  better.    Currently in Pain? Yes    Pain Score 4     Pain Location Back    Pain Orientation Lower    Pain Descriptors / Indicators Aching;Dull    Pain Type Chronic pain    Pain Onset More than a month ago    Pain Frequency Constant    Aggravating Factors  early morning, activity    Pain Relieving Factors sitting, rest breaks                [facility] PT Assessment - 11/25/20 0001       Assessment   Medical Diagnosis chronic pain syndrome, failed back syndrome, abnormality of gait    Referring Provider (PT) Delice Lesch, MD      Cognition   Overall Cognitive Status Within Functional Limits for tasks assessed      Observation/Other Assessments   Focus on Therapeutic Outcomes (FOTO)  53 (goal is 49)       Transfers   Comments ABC: 1030 raw score-64% confidence                           [facility] Adult PT Treatment/Exercise - 11/25/20 0001       Lumbar Exercises: Aerobic   Nustep L2 x 10', PT present to discuss progress      Lumbar Exercises: Standing   Row Strengthening;Both;Theraband;20 reps    Theraband Level (Row) Level 3 (Green)    Row Limitations standing on balance pad    Shoulder Extension Strengthening;Both;Theraband;20 reps    Theraband Level (Shoulder Extension) Level 3 (Green)    Shoulder Extension Limitations standing on balance pad      Lumbar Exercises: Seated   Long Arc Quad on Chair Strengthening;Both;1 set;10 reps;Weights    LAQ on Chair Weights (lbs) 2      Knee/Hip Exercises: Standing   Hip Flexion Stengthening;Knee bent;Both;2 sets;10 reps    Hip Flexion Limitations standing on balance pad 2# at ankles , UE support    Rocker Board 3 minutes      Knee/Hip Exercises: Seated   Sit to Sand 2 sets;5 reps;without UE support   holding 5# wt, VC to improve lumbar alignment                     PT Short Term Goals - 10/07/20 1057       PT SHORT TERM GOAL #2   Title improved upright posture in standing due to increased hip extension and increased glute strength    Time 4    Period Weeks    Status Achieved    Target Date 10/07/20               PT Long Term Goals - 11/25/20 1020       PT LONG TERM GOAL #1   Title independent with advanced HEP    Status Achieved      PT LONG TERM GOAL #2   Title improve FOTO to > or = to 49 to improve function    Baseline 53    Status Achieved      PT LONG TERM GOAL #3   Title improve LE endurance and strength to stand and walk for 10-15 minutes without significant limitation    Baseline 35-45 min    Status Achieved      PT LONG TERM GOAL #4   Title report a 60% reduction in LBP with ADLs and self-care  Baseline <10% change    Status Not Met      PT LONG TERM GOAL #5   Title  perform 5x sit to stand in < or = to 12 seconds to improve balance    Status Achieved      PT LONG TERM GOAL #6   Title improve ABC confidence scale score to > or = to 78%    Baseline 64.37% (1030)-Pt feels 80% overall improvement in confidence    Status Not Met                   Plan - 11/25/20 1040     Clinical Impression Statement Pt is ready to D/C to HEP today.  Pt reports minimal significant change in LBP but her balance has improved by 80% since the start of care (ABC score has not improved).  Pt has previously met FOTO goal for low back and 5x sit to stand was performed in < 10 seconds indicating reduced falls risk.  Pt has HEP in place and will continue with HEP for continued and futher gains.    PT Next Visit Plan D/C PT    PT Home Exercise Plan Access Code: [email or web address]    Recommended Other Services recert rerouted 08/27/63    Consulted and Agree with Plan of Care Patient             Patient will benefit from skilled therapeutic intervention in order to improve the following deficits and impairments:     Visit Diagnosis: Chronic bilateral low back pain without sciatica  Cramp and spasm  Muscle weakness (generalized)  Other abnormalities of gait and mobility     Problem List Patient Active Problem List   Diagnosis Date Noted   Primary osteoarthritis of right knee 06/02/2020   Left groin hernia 05/22/2020   Sleep disturbance 01/09/2020   Acute lower UTI    Anxiety state    Peripheral edema    Leukocytosis    Debility 12/13/2019   Dyslipidemia    Rectal bleeding    Acute blood loss anemia    Ischemic colitis ([place])    Status post femoral-popliteal bypass surgery 12/09/2019   Secondary hypercoagulable state ([place]) 12/03/2019   Paroxysmal atrial fibrillation ([place]) 11/27/2019   Chronic bilateral low back pain with right-sided sciatica 08/29/2019   Chronic pain of right knee 08/29/2019   Abnormality of gait 05/30/2019   Neuropathic pain 05/30/2019    Myalgia 08/08/2018   Chronic pain syndrome 06/27/2018   Gross hematuria 06/01/2018   Obesity (BMI 30.0-34.9) 11/14/2017   Failed back syndrome 09/28/2017   CKD (chronic kidney disease), stage III ([place]) 08/07/2017   Unilateral primary osteoarthritis, right knee 04/07/2016   Acute gout of left foot 03/22/2016   Pain in right ankle and joints of right foot 08/28/2014   Trigeminal neuralgia    Encounter for therapeutic drug monitoring 10/21/2013   Syncope 12/29/2011   Chronic low back pain 09/13/2010   Allergic rhinitis due to pollen 06/25/2010   COPD mixed type ([place]) 01/22/2010   Solitary pulmonary nodule 01/12/2010   Insulin resistance 01/06/2009   Osteopenia 01/06/2009   Eczema 11/26/2007   Peripheral arterial disease ([place]) 06/29/2007   Hyperlipidemia 10/23/2006   Essential hypertension 10/23/2006   ESOPHAGEAL STRICTURE 08/09/1993   PHYSICAL THERAPY DISCHARGE SUMMARY  Visits from Start of Care: 17  Current functional level related to goals / functional outcomes: See above for current functional status.     Remaining deficits: See above for current status.  Education / Equipment: HEP   Patient agrees to discharge. Patient goals were partially met. Patient is being discharged due to being pleased with the current functional level.  Sigurd Sos, PT 11/25/20 11:00 AM   [facility] [address], [place], [name], [address] Phone: [phone]   Fax:  [phone]  Name: [name] [name] MRN: [id number] Date of Birth: 1924-09-27

## 2020-11-26 DIAGNOSIS — U071 COVID-19: Secondary | ICD-10-CM | POA: Diagnosis not present

## 2020-12-01 ENCOUNTER — Telehealth: Payer: Self-pay | Admitting: Orthopaedic Surgery

## 2020-12-01 ENCOUNTER — Other Ambulatory Visit: Payer: Self-pay | Admitting: Physical Medicine and Rehabilitation

## 2020-12-01 ENCOUNTER — Other Ambulatory Visit: Payer: Self-pay | Admitting: Cardiovascular Disease

## 2020-12-01 NOTE — Telephone Encounter (Signed)
Pt called stating she would like to get another gel inj in her R knee and would like out office to send the authorizations to her insurance. Pt would like to be updated when we've submitted this and then when she's been approved or denied.   [phone]

## 2020-12-02 NOTE — Telephone Encounter (Signed)
Noted  

## 2020-12-08 ENCOUNTER — Telehealth: Payer: Self-pay

## 2020-12-08 NOTE — Telephone Encounter (Signed)
VOB submitted for SynviscOne, right knee. Pending BV. 

## 2020-12-09 ENCOUNTER — Other Ambulatory Visit: Payer: Self-pay

## 2020-12-09 ENCOUNTER — Encounter: Payer: Self-pay | Admitting: Physical Medicine and Rehabilitation

## 2020-12-09 ENCOUNTER — Ambulatory Visit: Payer: Medicare Other | Admitting: Physical Medicine and Rehabilitation

## 2020-12-09 ENCOUNTER — Encounter
Payer: Medicare Other | Attending: Physical Medicine and Rehabilitation | Admitting: Physical Medicine and Rehabilitation

## 2020-12-09 VITALS — BP 139/59 | HR 92 | Temp 98.4°F | Ht 59.0 in | Wt 158.4 lb

## 2020-12-09 DIAGNOSIS — M5441 Lumbago with sciatica, right side: Secondary | ICD-10-CM

## 2020-12-09 DIAGNOSIS — M792 Neuralgia and neuritis, unspecified: Secondary | ICD-10-CM | POA: Diagnosis not present

## 2020-12-09 DIAGNOSIS — G8929 Other chronic pain: Secondary | ICD-10-CM | POA: Diagnosis not present

## 2020-12-09 NOTE — Patient Instructions (Signed)
Discussed following foods that may reduce pain: 1) Ginger (especially studied for arthritis)- reduce leukotriene production to decrease inflammation 2) Blueberries- high in phytonutrients that decrease inflammation 3) Salmon- marine omega-3s reduce joint swelling and pain 4) Pumpkin seeds- reduce inflammation 5) dark chocolate- reduces inflammation 6) turmeric- reduces inflammation 7) tart cherries - reduce pain and stiffness 8) extra virgin olive oil - its compound olecanthal helps to block prostaglandins  9) chili peppers- can be eaten or applied topically via capsaicin 10) mint- helpful for headache, muscle aches, joint pain, and itching 11) garlic- reduces inflammation  Link to further information on diet for chronic pain: http://www.randall.com/

## 2020-12-09 NOTE — Progress Notes (Signed)
Subjective:    Patient ID: [name] [name], female    DOB: 1924/06/17, 87 y.o.   MRN: [id number]  HPI Female with pmh/psh of PAD, COPD, DVT, HTN, Gout, trigeminal neuralgia, post herpetic neuralgia, OA of right knee with injections, lumbar fusion present for follow up of pain in her back > leg pain.   Initially stated: Pt stated she has had back pain "all her life".  Getting progressively worse.  Movement improves the pain.  Standing and walking exacerbate the pain. Dull pain.  Non-radiating.  Intermittent.  Denies associated numbness, tingling, weakness.  Lidoderm patch help. Pain limits pt from doing things around the house.   She has been using tylenol, TENS, and lidocaine patches. She does not like heating pads as does not want to be tied down. Se has not tried any other medicines for  Since that time, pt states her back is hurting. She is doing HEP. She notes drowsiness with Baclofen. Denies falls. Rocking back and forth can help. Pain does radiate down her legs.   Pain Inventory Average Pain 6 Pain Right Now 6 My pain is constant, sharp and stabbing  In the last 24 hours, has pain interfered with the following? General activity 6 Relation with others 3 Enjoyment of life 7 What TIME of day is your pain at its worst? morning  and night Sleep (in general) Poor  Pain is worse with: walking, standing, and some activites Pain improves with: rest and therapy/exercise Relief from Meds: 6  Family History  Problem Relation Age of Onset   Arthritis Mother    Hypertension Mother    Heart disease Father    Colon polyps Father    Breast cancer Sister    Breast cancer Daughter    Hypertension Son    Lung cancer Brother    Colon cancer Sister    Brain cancer Sister    Lung cancer Sister    Anesthesia problems Neg Hx    Hypotension Neg Hx    Malignant hyperthermia Neg Hx    Pseudochol deficiency Neg Hx    Social History   Socioeconomic History   Marital status: Single     Spouse name: Not on file   Number of children: 4   Years of education: 12   Highest education level: Not on file  Occupational History   Occupation: retired    Fish farm manager: RETIRED  Tobacco Use   Smoking status: Former    Packs/day: 2.00    Years: 30.00    Pack years: 60.00    Types: Cigarettes    Quit date: 06/18/1993    Years since quitting: 27.4   Smokeless tobacco: Never   Tobacco comments:    QUIT IN 1995  Vaping Use   Vaping Use: Never used  Substance and Sexual Activity   Alcohol use: No    Alcohol/week: 0.0 standard drinks   Drug use: No   Sexual activity: Not on file  Other Topics Concern   Not on file  Social History Narrative   Patient is widowed with 4 children. 2 grandkids. Lives alone.    Patient is right handed.   Patient has high school education.   Patient drinks 1 cup daily.      Retired from [employer] for 28 years, works 2 days a week until 2016.       Hobbies: volunteers at [employer], [place] from church visits people.       No caffeine.    Social  Determinants of Health   Financial Resource Strain: Low Risk    Difficulty of Paying Living Expenses: Not hard at all  Food Insecurity: No Food Insecurity   Worried About Sl[place] in the Last Year: Never true   Ran Out of Food in the Last Year: Never true  Transportation Needs: No Transportation Needs   Lack of Transportation (Medical): No   Lack of Transportation (Non-Medical): No  Physical Activity: Sufficiently Active   Days of Exercise per Week: 7 days   Minutes of Exercise per Session: 30 min  Stress: No Stress Concern Present   Feeling of Stress : Not at all  Social Connections: Socially Isolated   Frequency of Communication with Friends and Family: More than three times a week   Frequency of Social Gatherings with Friends and Family: Never   Attends Religious Services: Never   Marine scientist or Organizations: No   Attends Archivist Meetings:  Never   Marital Status: Widowed   Past Surgical History:  Procedure Laterality Date   ABDOMINAL AORTAGRAM N/A 06/17/2011   Procedure: ABDOMINAL Maxcine Ham;  Surgeon: Elam Dutch, MD;  Location: [facility] CATH LAB;  Service: Cardiovascular;  Laterality: N/A;   ABDOMINAL AORTOGRAM W/LOWER EXTREMITY Bilateral 06/22/2018   Procedure: ABDOMINAL AORTOGRAM W/LOWER EXTREMITY;  Surgeon: Elam Dutch, MD;  Location: [place] CV LAB;  Service: Cardiovascular;  Laterality: Bilateral;   ABDOMINAL AORTOGRAM W/LOWER EXTREMITY Bilateral 11/29/2019   Procedure: ABDOMINAL AORTOGRAM W/LOWER EXTREMITY;  Surgeon: Elam Dutch, MD;  Location: [place] CV LAB;  Service: Cardiovascular;  Laterality: Bilateral;   CATARACT EXTRACTION, BILATERAL     w IOL   CHOLECYSTECTOMY  1998   COLONOSCOPY     ENDARTERECTOMY FEMORAL Left 12/09/2019   Procedure: ENDARTERECTOMY FEMORAL with bovine patch angioplasty.;  Surgeon: Elam Dutch, MD;  Location: [place];  Service: Vascular;  Laterality: Left;   FEMORAL-POPLITEAL BYPASS GRAFT        x2 surgeries 1990's & 2009   FEMORAL-POPLITEAL BYPASS GRAFT Right 12/09/2019   Procedure: REDO RIGHT FEMORAL-POPLITEAL ARTERY BYPASS GRAFT;  Surgeon: Elam Dutch, MD;  Location: [place];  Service: Vascular;  Laterality: Right;   INJECTION KNEE Right Aug. 2016   Gel injection for pain   LUMBAR FUSION  07/06/2011   TONSILLECTOMY     as a teenager    TUBAL LIGATION     Past Surgical History:  Procedure Laterality Date   ABDOMINAL AORTAGRAM N/A 06/17/2011   Procedure: ABDOMINAL Maxcine Ham;  Surgeon: Elam Dutch, MD;  Location: [facility] CATH LAB;  Service: Cardiovascular;  Laterality: N/A;   ABDOMINAL AORTOGRAM W/LOWER EXTREMITY Bilateral 06/22/2018   Procedure: ABDOMINAL AORTOGRAM W/LOWER EXTREMITY;  Surgeon: Elam Dutch, MD;  Location: [place] CV LAB;  Service: Cardiovascular;  Laterality: Bilateral;   ABDOMINAL AORTOGRAM W/LOWER EXTREMITY Bilateral 11/29/2019   Procedure:  ABDOMINAL AORTOGRAM W/LOWER EXTREMITY;  Surgeon: Elam Dutch, MD;  Location: [place] CV LAB;  Service: Cardiovascular;  Laterality: Bilateral;   CATARACT EXTRACTION, BILATERAL     w IOL   CHOLECYSTECTOMY  1998   COLONOSCOPY     ENDARTERECTOMY FEMORAL Left 12/09/2019   Procedure: ENDARTERECTOMY FEMORAL with bovine patch angioplasty.;  Surgeon: Elam Dutch, MD;  Location: [facility] OR;  Service: Vascular;  Laterality: Left;   FEMORAL-POPLITEAL BYPASS GRAFT        x2 surgeries 1990's & 2009   FEMORAL-POPLITEAL BYPASS GRAFT Right 12/09/2019   Procedure: REDO RIGHT FEMORAL-POPLITEAL ARTERY BYPASS  GRAFT;  Surgeon: Elam Dutch, MD;  Location: [facility] OR;  Service: Vascular;  Laterality: Right;   INJECTION KNEE Right Aug. 2016   Gel injection for pain   LUMBAR FUSION  07/06/2011   TONSILLECTOMY     as a teenager    TUBAL LIGATION     Past Medical History:  Diagnosis Date   Allergic rhinoconjunctivitis    Allergy    Anxiety    pt. managed- uses deep breathing    Arthritis    low back , stenosis   COLONIC POLYPS, RECURRENT 08/29/2006   2008 last colonoscopy. No further colonoscopy.      COPD (chronic obstructive pulmonary disease) (HCC)    Diverticulosis    Dysrhythmia    afib   Eczema    Environmental allergies    allergy shot- q friday in Dr. Janee Morn office. PFT's abnormal- recommended Spiriva to use preop & will d/c after surgery   GERD (gastroesophageal reflux disease)    Hiatal hernia    Hyperlipidemia    Hypertension    Lung nodule 2011   Paroxysmal atrial fibrillation ([place]) 11/27/2019   Peripheral vascular disease (HCC)    Shingles    Stenosis of popliteal artery (HCC)    blood clots in legs long ago     Trigeminal neuralgia    Left buttocks   BP (!) 139/59   Pulse 92   Temp 98.4 F (36.9 C)   Ht '4\' 11"'$  (1.499 m)   Wt 158 lb 6.4 oz (71.8 kg)   SpO2 96%   BMI 31.99 kg/m   Opioid Risk Score:   Fall Risk Score:  `1  Depression screen PHQ 2/9  Depression  screen [facility] 2/9 12/09/2020 09/04/2020 08/18/2020 05/22/2020 02/20/2020 01/16/2020 12/05/2018  Decreased Interest 0 0 0 0 1 0 0  Down, Depressed, Hopeless 0 0 0 0 1 0 0  PHQ - 2 Score 0 0 0 0 2 0 0  Some recent data might be hidden   Review of Systems  Constitutional: Negative.   HENT: Negative.    Eyes: Negative.   Respiratory: Negative.    Cardiovascular: Negative.   Gastrointestinal: Negative.   Endocrine: Negative.   Genitourinary: Negative.   Musculoskeletal:  Positive for back pain and gait problem.  Skin: Negative.   Allergic/Immunologic: Negative.   Hematological:  Bruises/bleeds easily.       Eliquis  Psychiatric/Behavioral: Negative.    All other systems reviewed and are negative.    Objective:   Physical Exam  Gen: no distress, normal appearing HEENT: oral mucosa pink and moist, NCAT Cardio: Reg rate Chest: normal effort, normal rate of breathing Abd: soft, non-distended Ext: no edema Psych: pleasant, normal affect Skin: Warm and dry.  Intact. Psych: Normal mood.  Normal behavior. Musc: Edema in LE. No tenderness in extremities. Gait: Mildly antalgic Mild TTP lumbosacral PSPs Neuro: Alert             Strength 5/5 grossly throughout. Pain is worse with extension    Assessment & Plan:  Female with pmhpsh of PAD, COPD, DVT, HTN, Gout, trigeminal neuralgia, post herpetic neuralgia, OA of right knee with injections, lumbar fusion present for follow up of pain in her back > leg pain.     1. Chronic low back pain with failed back syndrome              Xray of L-spine and right SI joint reviewed, SI joint unremarkable, degenerative changes in spine with right scoliosis.  Will avoid NSAIDs due to coumadin             Stopped using Robaxin due to nausea             Unable to tolerate Gabapentin             Cont heat  Repeat MRI lumbar spine, last 2012, to help see if facet athropathy.              Cont tylenol, limit to '2000mg'$ /day             Continue Lidoderm  patches  Will refer to PT now that patient has transportation             Resume TENS unit PRN             Continue  Cymbalta to 60 mg  Baclofen changed to 5 qhs   2. Possible Sacroiliitis bilaterally             Xray unremakrable             Minimal benefit with bracing              Discussed b/l SI joint injections -Discussed benefits of exercise in reducing pain. -Discussed following foods that may reduce pain: 1) Ginger (especially studied for arthritis)- reduce leukotriene production to decrease inflammation 2) Blueberries- high in phytonutrients that decrease inflammation 3) Salmon- marine omega-3s reduce joint swelling and pain 4) Pumpkin seeds- reduce inflammation 5) dark chocolate- reduces inflammation 6) turmeric- reduces inflammation 7) tart cherries - reduce pain and stiffness 8) extra virgin olive oil - its compound olecanthal helps to block prostaglandins  9) chili peppers- can be eaten or applied topically via capsaicin 10) mint- helpful for headache, muscle aches, joint pain, and itching 11) garlic- reduces inflammation  Link to further information on diet for chronic pain: http://www.randall.com/  3. Sleep disturbance             Continue melatonin             Encouraged sleep hygiene   4. Neuropathic pain             See #1   5. Gait abnormality             Improved             Continue rolling walker, attempting to transition to cane             Cont HEP             Information given on core strengthening exercises, patient more compliant             Will refer for PT   6. Myalgia             Good benefit with trigger point injections, will schedule again with Dr. Adam Phenix   7. Right knee pain             MRI reviewed from 2016, showing meniscal/cartilage damage             Receiving injections from Ortho, cont             [place] denied             Cont voltaren gel             Cont  lidoderm on knee             Improved   8. Right ankle pain - likely gout  flare             Lidoderm patches, pt using Voltaren gel             Xrays reviewed, relatively uremarkable             Uric acid elevated             Improved    9. Rectal bleeding             Resolved   10.  Trigeminal neuralgia.              Improved per patient, recommend follow up with Neuro regarding medication d/c questions             Continue Lamictal 100 mg twice daily    11. PVD s/p redo right femoral to below-knee popliteal bypass as well as left common femoral enterectomy 12/09/2019.   Cont to follow up with Vascular

## 2020-12-14 ENCOUNTER — Telehealth: Payer: Self-pay | Admitting: *Deleted

## 2020-12-14 ENCOUNTER — Telehealth: Payer: Self-pay

## 2020-12-14 NOTE — Telephone Encounter (Signed)
[name] [name] called and would like a pre med for her scheduled MRI.

## 2020-12-14 NOTE — Telephone Encounter (Signed)
Approved for SynviscOne, right knee. [place] deductible has been met Covered at 100% through [employer] after primary pays. No Co-pay No PA required  Appt.12/22/2020 with Dr. [name]

## 2020-12-15 ENCOUNTER — Telehealth: Payer: Self-pay

## 2020-12-15 ENCOUNTER — Other Ambulatory Visit: Payer: Self-pay | Admitting: Physical Medicine and Rehabilitation

## 2020-12-15 MED ORDER — DIAZEPAM 2 MG PO TABS: 2.0000 mg | ORAL_TABLET | Freq: Once | ORAL | 0 refills | Status: AC

## 2020-12-15 NOTE — Telephone Encounter (Signed)
Notified. 

## 2020-12-15 NOTE — Telephone Encounter (Signed)
[name] [name] called back about her pre med.  She is claustrophobic. Her MRI is 12/25/20 and would like something for her anxiety.

## 2020-12-15 NOTE — Telephone Encounter (Signed)
Pt called in stated that Dr Basilio Cairo with care management believes the [name] [name] needs an MRI. Pt's daughter wants to check with Dr Yong Channel before getting the MRI. Pt would just like approval from Dr Yong Channel for the MRI. Pt wants to make sure that it is safe for her. Please Advise.

## 2020-12-16 DIAGNOSIS — H02834 Dermatochalasis of left upper eyelid: Secondary | ICD-10-CM | POA: Diagnosis not present

## 2020-12-16 DIAGNOSIS — H40053 Ocular hypertension, bilateral: Secondary | ICD-10-CM | POA: Diagnosis not present

## 2020-12-16 DIAGNOSIS — H10413 Chronic giant papillary conjunctivitis, bilateral: Secondary | ICD-10-CM | POA: Diagnosis not present

## 2020-12-16 DIAGNOSIS — Z961 Presence of intraocular lens: Secondary | ICD-10-CM | POA: Diagnosis not present

## 2020-12-16 DIAGNOSIS — H538 Other visual disturbances: Secondary | ICD-10-CM | POA: Diagnosis not present

## 2020-12-16 DIAGNOSIS — H02831 Dermatochalasis of right upper eyelid: Secondary | ICD-10-CM | POA: Diagnosis not present

## 2020-12-16 DIAGNOSIS — H16223 Keratoconjunctivitis sicca, not specified as Sjogren's, bilateral: Secondary | ICD-10-CM | POA: Diagnosis not present

## 2020-12-16 NOTE — Telephone Encounter (Signed)
I am not sure who Dr. Basilio Cairo is- can we get more information on this medical decision- what is bothering her? What is mri for? Where is MRI located? etc

## 2020-12-17 NOTE — Telephone Encounter (Signed)
That sounds very reasonable-I am in agreement with the plan to proceed with MRI

## 2020-12-17 NOTE — Telephone Encounter (Signed)
Dr Basilio Cairo is taking the place of dr Posey Pronto. Dr Posey Pronto was managing her pain and that's what Dr Basilio Cairo is taking over. Dr Basilio Cairo  thinks the patient needs a MRI of her back to see what it will show to try and get the patient some injections in her back for the pain that she's been having. The patient and her daughter just wanted to run this by you to get your take on it. The MRI will be done 12/25/2020 at [facility] at 12pm

## 2020-12-21 ENCOUNTER — Other Ambulatory Visit: Payer: Self-pay

## 2020-12-21 MED ORDER — ATORVASTATIN CALCIUM 40 MG PO TABS: 40.0000 mg | ORAL_TABLET | Freq: Every day | ORAL | 3 refills | Status: DC

## 2020-12-22 ENCOUNTER — Encounter: Payer: Self-pay | Admitting: Orthopaedic Surgery

## 2020-12-22 ENCOUNTER — Ambulatory Visit (INDEPENDENT_AMBULATORY_CARE_PROVIDER_SITE_OTHER): Payer: Medicare Other | Admitting: Orthopaedic Surgery

## 2020-12-22 ENCOUNTER — Other Ambulatory Visit: Payer: Self-pay

## 2020-12-22 VITALS — Ht 59.0 in | Wt 158.0 lb

## 2020-12-22 DIAGNOSIS — M1711 Unilateral primary osteoarthritis, right knee: Secondary | ICD-10-CM

## 2020-12-22 MED ORDER — LIDOCAINE HCL 1 % IJ SOLN
2.0000 mL | INTRAMUSCULAR | Status: AC | PRN
  Administered 2020-12-22: 2 mL

## 2020-12-22 MED ORDER — BUPIVACAINE HCL 0.25 % IJ SOLN
2.0000 mL | INTRAMUSCULAR | Status: AC | PRN
  Administered 2020-12-22: 2 mL via INTRA_ARTICULAR

## 2020-12-22 MED ORDER — HYLAN G-F 20 48 MG/6ML IX SOSY
48.0000 mg | PREFILLED_SYRINGE | INTRA_ARTICULAR | Status: AC | PRN
Start: 2020-12-22 — End: 2020-12-22
  Administered 2020-12-22: 48 mg via INTRA_ARTICULAR

## 2020-12-22 NOTE — Progress Notes (Signed)
Office Visit Note   Patient: [name] [name]           Date of Birth: November 20, 1924           MRN: [id number] Visit Date: 12/22/2020              Requested by: Marin Olp, MD [place],  [place] [id number] PCP: Marin Olp, MD   Assessment & Plan: Visit Diagnoses:  1. Unilateral primary osteoarthritis, right knee     Plan: Impression is right knee degenerative joint disease.  Today, proceed with the Synvisc 1 injection.  She tolerated this well.  She will follow-up with [place] as needed.  Follow-Up Instructions: Return if symptoms worsen or fail to improve.   Orders:  Orders Placed This Encounter  Procedures   Large Joint Inj: R knee   No orders of the defined types were placed in this encounter.     Procedures: Large Joint Inj: R knee on 12/22/2020 10:00 AM Indications: pain Details: 22 G needle, anterolateral approach Medications: 2 mL lidocaine 1 %; 2 mL bupivacaine 0.25 %; 48 mg Hylan 48 MG/6ML     Clinical Data: No additional findings.   Subjective: Chief Complaint  Patient presents with   Right Knee - Follow-up    Synvisc One    HPI patient is a pleasant 87 year old female with an underlying history of right knee degenerative joint disease who comes in today for Synvisc 1 injection to the right knee.  She has had these before with relief lasting anywhere from 4 to 8 months.     Objective: Vital Signs: Ht '4\' 11"'$  (1.499 m)   Wt 158 lb (71.7 kg)   BMI 31.91 kg/m     Ortho Exam stable right knee exam  Specialty Comments:  No specialty comments available.  Imaging: No new imaging   PMFS History: Patient Active Problem List   Diagnosis Date Noted   Primary osteoarthritis of right knee 06/02/2020   Left groin hernia 05/22/2020   Sleep disturbance 01/09/2020   Acute lower UTI    Anxiety state    Peripheral edema    Leukocytosis    Debility 12/13/2019   Dyslipidemia    Rectal bleeding    Acute blood loss anemia     Ischemic colitis ([place])    Status post femoral-popliteal bypass surgery 12/09/2019   Secondary hypercoagulable state ([place]) 12/03/2019   Paroxysmal atrial fibrillation ([place]) 11/27/2019   Chronic bilateral low back pain with right-sided sciatica 08/29/2019   Chronic pain of right knee 08/29/2019   Abnormality of gait 05/30/2019   Neuropathic pain 05/30/2019   Myalgia 08/08/2018   Chronic pain syndrome 06/27/2018   Gross hematuria 06/01/2018   Obesity (BMI 30.0-34.9) 11/14/2017   Failed back syndrome 09/28/2017   CKD (chronic kidney disease), stage III ([place]) 08/07/2017   Unilateral primary osteoarthritis, right knee 04/07/2016   Acute gout of left foot 03/22/2016   Pain in right ankle and joints of right foot 08/28/2014   Trigeminal neuralgia    Encounter for therapeutic drug monitoring 10/21/2013   Syncope 12/29/2011   Chronic low back pain 09/13/2010   Allergic rhinitis due to pollen 06/25/2010   COPD mixed type ([place]) 01/22/2010   Solitary pulmonary nodule 01/12/2010   Insulin resistance 01/06/2009   Osteopenia 01/06/2009   Eczema 11/26/2007   Peripheral arterial disease ([place]) 06/29/2007   Hyperlipidemia 10/23/2006   Essential hypertension 10/23/2006   ESOPHAGEAL STRICTURE 08/09/1993   Past Medical History:  Diagnosis Date   Allergic rhinoconjunctivitis    Allergy    Anxiety    pt. managed- uses deep breathing    Arthritis    low back , stenosis   COLONIC POLYPS, RECURRENT 08/29/2006   2008 last colonoscopy. No further colonoscopy.      COPD (chronic obstructive pulmonary disease) (HCC)    Diverticulosis    Dysrhythmia    afib   Eczema    Environmental allergies    allergy shot- q friday in Dr. Janee Morn office. PFT's abnormal- recommended Spiriva to use preop & will d/c after surgery   GERD (gastroesophageal reflux disease)    Hiatal hernia    Hyperlipidemia    Hypertension    Lung nodule 2011   Paroxysmal atrial fibrillation ([place]) 11/27/2019   Peripheral vascular  disease ([place])    Shingles    Stenosis of popliteal artery (HCC)    blood clots in legs long ago     Trigeminal neuralgia    Left buttocks    Family History  Problem Relation Age of Onset   Arthritis Mother    Hypertension Mother    Heart disease Father    Colon polyps Father    Breast cancer Sister    Breast cancer Daughter    Hypertension Son    Lung cancer Brother    Colon cancer Sister    Brain cancer Sister    Lung cancer Sister    Anesthesia problems Neg Hx    Hypotension Neg Hx    Malignant hyperthermia Neg Hx    Pseudochol deficiency Neg Hx     Past Surgical History:  Procedure Laterality Date   ABDOMINAL AORTAGRAM N/A 06/17/2011   Procedure: ABDOMINAL Maxcine Ham;  Surgeon: Elam Dutch, MD;  [facility];  Service: Cardiovascular;  Laterality: N/A;   ABDOMINAL AORTOGRAM W/LOWER EXTREMITY Bilateral 06/22/2018   Procedure: ABDOMINAL AORTOGRAM W/LOWER EXTREMITY;  Surgeon: Elam Dutch, MD;  Location: [place] CV LAB;  Service: Cardiovascular;  Laterality: Bilateral;   ABDOMINAL AORTOGRAM W/LOWER EXTREMITY Bilateral 11/29/2019   Procedure: ABDOMINAL AORTOGRAM W/LOWER EXTREMITY;  Surgeon: Elam Dutch, MD;  Location: [place] CV LAB;  Service: Cardiovascular;  Laterality: Bilateral;   CATARACT EXTRACTION, BILATERAL     w IOL   CHOLECYSTECTOMY  1998   COLONOSCOPY     ENDARTERECTOMY FEMORAL Left 12/09/2019   Procedure: ENDARTERECTOMY FEMORAL with bovine patch angioplasty.;  Surgeon: Elam Dutch, MD;  Location: [place];  Service: Vascular;  Laterality: Left;   FEMORAL-POPLITEAL BYPASS GRAFT        x2 surgeries 1990's & 2009   FEMORAL-POPLITEAL BYPASS GRAFT Right 12/09/2019   Procedure: REDO RIGHT FEMORAL-POPLITEAL ARTERY BYPASS GRAFT;  Surgeon: Elam Dutch, MD;  Location: [place];  Service: Vascular;  Laterality: Right;   INJECTION KNEE Right Aug. 2016   Gel injection for pain   LUMBAR FUSION  07/06/2011   TONSILLECTOMY     as a teenager     TUBAL LIGATION     Social History   Occupational History   Occupation: retired    Fish farm manager: RETIRED  Tobacco Use   Smoking status: Former    Packs/day: 2.00    Years: 30.00    Pack years: 60.00    Types: Cigarettes    Quit date: 06/18/1993    Years since quitting: 27.5   Smokeless tobacco: Never   Tobacco comments:    QUIT IN 1995  Vaping Use   Vaping Use: Never used  Substance  and Sexual Activity   Alcohol use: No    Alcohol/week: 0.0 standard drinks   Drug use: No   Sexual activity: Not on file

## 2020-12-25 ENCOUNTER — Ambulatory Visit (HOSPITAL_COMMUNITY)
Admission: RE | Admit: 2020-12-25 | Discharge: 2020-12-25 | Disposition: A | Discharge status: Home / Self Care | Payer: Medicare Other | Source: Ambulatory Visit | Attending: Physical Medicine and Rehabilitation | Admitting: Physical Medicine and Rehabilitation

## 2020-12-25 ENCOUNTER — Other Ambulatory Visit: Payer: Self-pay

## 2020-12-25 DIAGNOSIS — M5441 Lumbago with sciatica, right side: Secondary | ICD-10-CM | POA: Insufficient documentation

## 2020-12-25 DIAGNOSIS — G8929 Other chronic pain: Secondary | ICD-10-CM | POA: Insufficient documentation

## 2020-12-25 DIAGNOSIS — M545 Low back pain, unspecified: Secondary | ICD-10-CM | POA: Diagnosis not present

## 2020-12-25 MED ORDER — GADOBUTROL 1 MMOL/ML IV SOLN
7.1000 mL | Freq: Once | INTRAVENOUS | Status: AC | PRN
  Administered 2020-12-25: 7.1 mL via INTRAVENOUS

## 2020-12-30 ENCOUNTER — Encounter
Payer: Medicare Other | Attending: Physical Medicine and Rehabilitation | Admitting: Physical Medicine and Rehabilitation

## 2020-12-30 ENCOUNTER — Encounter (HOSPITAL_BASED_OUTPATIENT_CLINIC_OR_DEPARTMENT_OTHER): Payer: Self-pay | Admitting: Cardiovascular Disease

## 2020-12-30 ENCOUNTER — Ambulatory Visit (INDEPENDENT_AMBULATORY_CARE_PROVIDER_SITE_OTHER): Payer: Medicare Other | Admitting: Cardiovascular Disease

## 2020-12-30 ENCOUNTER — Other Ambulatory Visit: Payer: Self-pay

## 2020-12-30 ENCOUNTER — Encounter: Payer: Self-pay | Admitting: Physical Medicine and Rehabilitation

## 2020-12-30 VITALS — BP 112/60 | HR 86 | Ht 59.0 in | Wt 161.2 lb

## 2020-12-30 DIAGNOSIS — M791 Myalgia, unspecified site: Secondary | ICD-10-CM

## 2020-12-30 DIAGNOSIS — G8929 Other chronic pain: Secondary | ICD-10-CM | POA: Diagnosis not present

## 2020-12-30 DIAGNOSIS — I739 Peripheral vascular disease, unspecified: Secondary | ICD-10-CM

## 2020-12-30 DIAGNOSIS — Z5181 Encounter for therapeutic drug level monitoring: Secondary | ICD-10-CM

## 2020-12-30 DIAGNOSIS — I4819 Other persistent atrial fibrillation: Secondary | ICD-10-CM | POA: Diagnosis not present

## 2020-12-30 DIAGNOSIS — R269 Unspecified abnormalities of gait and mobility: Secondary | ICD-10-CM | POA: Diagnosis not present

## 2020-12-30 DIAGNOSIS — I5033 Acute on chronic diastolic (congestive) heart failure: Secondary | ICD-10-CM | POA: Diagnosis not present

## 2020-12-30 DIAGNOSIS — G4700 Insomnia, unspecified: Secondary | ICD-10-CM | POA: Diagnosis not present

## 2020-12-30 DIAGNOSIS — I10 Essential (primary) hypertension: Secondary | ICD-10-CM

## 2020-12-30 DIAGNOSIS — M961 Postlaminectomy syndrome, not elsewhere classified: Secondary | ICD-10-CM | POA: Insufficient documentation

## 2020-12-30 DIAGNOSIS — M25561 Pain in right knee: Secondary | ICD-10-CM | POA: Diagnosis not present

## 2020-12-30 DIAGNOSIS — I5032 Chronic diastolic (congestive) heart failure: Secondary | ICD-10-CM | POA: Insufficient documentation

## 2020-12-30 DIAGNOSIS — E7800 Pure hypercholesterolemia, unspecified: Secondary | ICD-10-CM

## 2020-12-30 DIAGNOSIS — M5441 Lumbago with sciatica, right side: Secondary | ICD-10-CM | POA: Diagnosis not present

## 2020-12-30 DIAGNOSIS — M25571 Pain in right ankle and joints of right foot: Secondary | ICD-10-CM | POA: Diagnosis not present

## 2020-12-30 DIAGNOSIS — G500 Trigeminal neuralgia: Secondary | ICD-10-CM

## 2020-12-30 HISTORY — DX: Acute on chronic diastolic (congestive) heart failure: I50.33

## 2020-12-30 MED ORDER — FUROSEMIDE 40 MG PO TABS: 40.0000 mg | ORAL_TABLET | Freq: Every day | ORAL | 0 refills | Status: DC

## 2020-12-30 NOTE — Assessment & Plan Note (Signed)
She remains in atrial fibrillation.  Rates are well-controlled.  Continue metoprolol and Eliquis.

## 2020-12-30 NOTE — Patient Instructions (Addendum)
Medication Instructions:  INCREASE YOUR FUROSEMIDE TO 40 MG DAILY   *If you need a refill on your cardiac medications before your next appointment, please call your pharmacy*  Lab Work: FASTING LP/CMET 1 WEEK    If you have labs (blood work) drawn today and your tests are completely normal, you will receive your results only by: [place] (if you have MyChart) OR A paper copy in the mail If you have any lab test that is abnormal or we need to change your treatment, we will call you to review the results.  Testing/Procedures: NONE  Follow-Up: At [facility], you and your health needs are our priority.  As part of our continuing mission to provide you with exceptional heart care, we have created designated Provider Care Teams.  These Care Teams include your primary Cardiologist (physician) and Advanced Practice Providers (APPs -  Physician Assistants and Nurse Practitioners) who all work together to provide you with the care you need, when you need it.  We recommend signing up for the patient portal called "MyChart".  Sign up information is provided on this After Visit Summary.  MyChart is used to connect with patients for Virtual Visits (Telemedicine).  Patients are able to view lab/test results, encounter notes, upcoming appointments, etc.  Non-urgent messages can be sent to your provider as well.   To learn more about what you can do with MyChart, go to [email or web address].    Your next appointment:   3 month(s)  The format for your next appointment:   In Person  Provider:   Skeet Latch, MD  Other Instructions  MONITOR YOUR BLOOD PRESSURE AT [place] [facility]

## 2020-12-30 NOTE — Progress Notes (Signed)
Cardiology Office Note   Date:  12/30/2020   ID:  [name] [name], DOB 1925/01/20, MRN [id number]  PCP:  Marin Olp, [name] [name]  Cardiologist:   Skeet Latch, [name] [name]   No chief complaint on file.    History of Present Illness: [name] [name] is a 87 y.o. female with hypertension, hyperlipidemia, COPD, and PAD status post right femoral to above-knee popliteal bypass (2006) here fore follow up.  She has a known stenosis of her peripheral bypass based on an arteriogram 05/2018 which revealed 80% narrowing of the mid right femoral-popliteal bypass graft.  She also had 70% stenosis of her left common femoral..  She previously deferred intervention because of concern about having an operation at her age.  However she has had increasing symptoms of claudication and last saw Dr. Oneida Alar on 7/29.  She previously underwent revision of her femoral to above-knee bypass in 2010.  This was extended to below the knee popliteal artery.  She is on chronic warfarin therapy since her thrombolysis procedure several years ago.  She is scheduled to undergo aortogram with lower extremity runoff on 11/29/2019.  She was referred to cardiology for presurgical risk assessment prior to redo of her right leg bypass on 8/16.  [name] [name] had a stress echo 12/2011 that revealed normal systolic function and no ischemia.  [name] [name] was seen in 2021 for preoperative risk assessment prior to redo femoral-popliteal bypass grafting.  At that appointment she was noted to be in atrial fibrillation, which was a new finding.  She was started on metoprolol for rate control and was already on warfarin.  After her surgery she developed rectal bleeding due to acute colitis.  That hospitalization was also complicated by atrial fibrillation with RVR.  She followed up with [name] Memos, [name] [name] on 02/2020 and was doing well.  Prior to that appointment her furosemide was increased due to volume overload.  Echo 11/2019 revealed LVEF 60 to 65% with mild  left atrial enlargement and no other significant abnormalities.  She had ABIs 04/2020 which were 0.97 on the right and 1.29 on the left.  Arterial Dopplers revealed patent bypass grafts without stenosis. She has no claudication.    At her last appointment her blood pressure was elevated in the office but she thought it had been well-controlled at home.  She wanted to track and bring to follow-up.  Since then lisinopril was added to her regimen. Overall she is feeling well.  She is sometimes short of breath but only on humid days.  She has struggled with some LE edema but hasn't been wearing her compression socks.  Her edema improves with elevation.  She has been taking lasix almost daily.  Her weight has been up.  Her BP at home has been 120s-140s/70s-80s.     Past Medical History:  Diagnosis Date   Acute on chronic diastolic heart failure ([place]) 12/30/2020   Allergic rhinoconjunctivitis    Allergy    Anxiety    pt. managed- uses deep breathing    Arthritis    low back , stenosis   COLONIC POLYPS, RECURRENT 08/29/2006   2008 last colonoscopy. No further colonoscopy.      COPD (chronic obstructive pulmonary disease) (HCC)    Diverticulosis    Dysrhythmia    afib   Eczema    Environmental allergies    allergy shot- q friday in Dr. Janee Morn office. PFT's abnormal- recommended Spiriva to use preop & will d/c after surgery   GERD (gastroesophageal  reflux disease)    Hiatal hernia    Hyperlipidemia    Hypertension    Lung nodule 2011   Paroxysmal atrial fibrillation ([place]) 11/27/2019   Peripheral vascular disease (HCC)    Shingles    Stenosis of popliteal artery (HCC)    blood clots in legs long ago     Trigeminal neuralgia    Left buttocks    Past Surgical History:  Procedure Laterality Date   ABDOMINAL AORTAGRAM N/A 06/17/2011   Procedure: ABDOMINAL Maxcine Ham;  Surgeon: Elam Dutch, [name] [name];  Location: [facility] CATH LAB;  Service: Cardiovascular;  Laterality: N/A;   ABDOMINAL AORTOGRAM W/LOWER  EXTREMITY Bilateral 06/22/2018   Procedure: ABDOMINAL AORTOGRAM W/LOWER EXTREMITY;  Surgeon: Elam Dutch, [name] [name];  Location: [place] CV LAB;  Service: Cardiovascular;  Laterality: Bilateral;   ABDOMINAL AORTOGRAM W/LOWER EXTREMITY Bilateral 11/29/2019   Procedure: ABDOMINAL AORTOGRAM W/LOWER EXTREMITY;  Surgeon: Elam Dutch, [name] [name];  Location: [place] CV LAB;  Service: Cardiovascular;  Laterality: Bilateral;   CATARACT EXTRACTION, BILATERAL     w IOL   CHOLECYSTECTOMY  1998   COLONOSCOPY     ENDARTERECTOMY FEMORAL Left 12/09/2019   Procedure: ENDARTERECTOMY FEMORAL with bovine patch angioplasty.;  Surgeon: Elam Dutch, [name] [name];  Location: [place];  Service: Vascular;  Laterality: Left;   FEMORAL-POPLITEAL BYPASS GRAFT        x2 surgeries 1990's & 2009   FEMORAL-POPLITEAL BYPASS GRAFT Right 12/09/2019   Procedure: REDO RIGHT FEMORAL-POPLITEAL ARTERY BYPASS GRAFT;  Surgeon: Elam Dutch, [name] [name];  Location: [place];  Service: Vascular;  Laterality: Right;   INJECTION KNEE Right Aug. 2016   Gel injection for pain   LUMBAR FUSION  07/06/2011   TONSILLECTOMY     as a teenager    TUBAL LIGATION       Current Outpatient Medications  Medication Sig Dispense Refill   acetaminophen (TYLENOL) 325 MG tablet Take 1-2 tablets (325-650 mg total) by mouth every 4 (four) hours as needed for mild pain (or temp >/= 101 F).     atorvastatin (LIPITOR) 40 MG tablet Take 1 tablet (40 mg total) by mouth daily. 90 tablet 3   baclofen (LIORESAL) 10 MG tablet TAKE 1/2 (ONE-HALF) TABLET BY MOUTH NIGHTLY 30 tablet 0   ELIQUIS 5 MG TABS tablet Take 1 tablet by mouth twice daily 180 tablet 1   ipratropium (ATROVENT) 0.06 % nasal spray USE 2 SPRAY(S) IN EACH NOSTRIL THREE TIMES DAILY 15 mL 5   lisinopril (ZESTRIL) 5 MG tablet Take 1 tablet by mouth once daily 30 tablet 0   metoprolol succinate (TOPROL-XL) 25 MG 24 hr tablet Take 3 tablets (75 mg total) by mouth daily. 270 tablet 1   Multiple Vitamin (MULTIVITAMIN  WITH MINERALS) TABS tablet Take 1 tablet by mouth daily.     pantoprazole (PROTONIX) 40 MG tablet Take one tablet twice daily as directed 60 tablet 11   RESTASIS 0.05 % ophthalmic emulsion Place 1 drop into both eyes 2 (two) times daily as needed (dry eyes).   99   furosemide (LASIX) 40 MG tablet Take 1 tablet (40 mg total) by mouth daily. 90 tablet 0   No current facility-administered medications for this visit.    Allergies:   Sulfa antibiotics, Tiotropium bromide, Latex, Ultram [tramadol], and 5-alpha reductase inhibitors   Social History:  The patient  reports that she quit smoking about 27 years ago. Her smoking use included cigarettes. She has a 60.00 pack-year smoking history. She has never used  smokeless tobacco. She reports that she does not drink alcohol and does not use drugs.   Family History:  The patient's family history includes Arthritis in her mother; Brain cancer in her sister; Breast cancer in her daughter and sister; Colon cancer in her sister; Colon polyps in her father; Heart disease in her father; Hypertension in her mother and son; Lung cancer in her brother and sister.    ROS:  Please see the history of present illness.   Otherwise, review of systems are positive for none.   All other systems are reviewed and negative.    PHYSICAL EXAM: VS:  BP 112/60   Pulse 86   Ht '4\' 11"'$  (1.499 m)   Wt 161 lb 3.2 oz (73.1 kg)   BMI 32.56 kg/m  , BMI Body mass index is 32.56 kg/m. GENERAL:  Well appearing HEENT:  Pupils equal round and reactive, fundi not visualized, oral mucosa unremarkable NECK:  No jugular venous distention, waveform within normal limits, carotid upstroke brisk and symmetric, no bruits LUNGS:  Clear to auscultation bilaterally HEART:  Irregularly irregular.  Tachycardic.  PMI not displaced or sustained,S1 and S2 within normal limits, no S3, no S4, no clicks, no rubs, no murmurs/. Distant heart sounds ABD:  Flat, positive bowel sounds normal in frequency in  pitch, no bruits, no rebound, no guarding, no midline pulsatile mass, no hepatomegaly, no splenomegaly EXT:  Unable to palpate pedal or popliteal pulses bilaterally.  2+Femoral and radial pulses.  Trace edema, no cyanosis no clubbing SKIN:  No rashes no nodules NEURO:  Cranial nerves II through XII grossly intact, motor grossly intact throughout PSYCH:  Cognitively intact, oriented to person place and time  EKG:  EKG is ordered today. The ekg ordered today demonstrates atrial fibrillation.  Rate 111 bpm. 12/30/20: Atrial fibrillation.  Rte 86 bpm.  Low voltage.     Recent Labs: 08/20/2020: ALT 14; BUN 25; Creatinine, Ser 1.16; Hemoglobin 13.2; Platelets 159.0; Potassium 4.3; Sodium 142    Lipid Panel    Component Value Date/Time   CHOL 77 12/10/2019 0523   TRIG 74 12/10/2019 0523   TRIG 141 04/10/2006 0812   HDL 30 (L) 12/10/2019 0523   CHOLHDL 2.6 12/10/2019 0523   VLDL 15 12/10/2019 0523   LDLCALC 32 12/10/2019 0523   LDLDIRECT 46.0 06/10/2019 1132      Wt Readings from Last 3 Encounters:  12/30/20 161 lb 3.2 oz (73.1 kg)  12/22/20 158 lb (71.7 kg)  12/09/20 158 lb 6.4 oz (71.8 kg)      ASSESSMENT AND PLAN:  # Paroxysmal atrial fibrillation:  [name] [name] is doing well.  Continue Eliquis and metoprolol.  # Hypertension:  Pressure poorly-controlled.  She is going to check it at home and bring to follow-up in 1 month.  For now continue metoprolol.  # PAD:  # Hyperlipidemia: [name] [name] is s/p re-do R femoral-popliteal bypass 8/2021s.  Lipids are at goal.  Continue atorvastatin.  Continue Eliquis.   Current medicines are reviewed at length with the patient today.  The patient does not have concerns regarding medicines.  The following changes have been made:  none  Labs/ tests ordered today include:   Orders Placed This Encounter  Procedures   Lipid panel   Comprehensive metabolic panel   EKG [id number]      Disposition:   FU with [name] Joost C. Oval Linsey, [name] [name], [facility] in  6 months.  PharmD in 1 month for BP control.    Signed, Samit Sylve C.  Oval Linsey, [name] [name], [facility]  12/30/2020 11:00 AM    [place]

## 2020-12-30 NOTE — Assessment & Plan Note (Signed)
Lately she has had increasing lower extremity edema and has been taking Lasix daily.  Her weight is up over 5 pounds.  We will increase her Lasix to 40 mg a day.  Check a CMP in about a week.  Continue to use compression socks, especially if she is going to be up on her feet a lot.

## 2020-12-30 NOTE — Assessment & Plan Note (Signed)
Blood pressures well have been controlled on lisinopril and metoprolol.

## 2020-12-30 NOTE — Assessment & Plan Note (Signed)
Stable.  She is on Eliquis so no aspirin.  Check fasting lipids and a CMP.  Continue atorvastatin.

## 2020-12-30 NOTE — Assessment & Plan Note (Signed)
Lipids have been well-controlled.  Continue atorvastatin and check fasting lipids and a CMP in about a week.

## 2020-12-30 NOTE — Progress Notes (Signed)
Subjective:    Patient ID: [name] [name], female    DOB: 1924/08/18, 87 y.o.   MRN: [id number]  HPI An audio/video tele-health visit is felt to be the most appropriate encounter for this patient at this time. See MyChart message from today for the patient's consent to a tele-health encounter with [facility] [facility]. This is a follow up tele-visit via phone. The patient is at home. MD is at office.    Female with pmh/psh of PAD, COPD, DVT, HTN, Gout, trigeminal neuralgia, post herpetic neuralgia, OA of right knee with injections, lumbar fusion present for follow up of pain in her back > leg pain.   Initially stated: Pt stated she has had back pain "all her life".  Getting progressively worse.  Movement improves the pain.  Standing and walking exacerbate the pain. Dull pain.  Non-radiating.  Intermittent.  Denies associated numbness, tingling, weakness.  Lidoderm patch help. Pain limits pt from doing things around the house.   She has been using tylenol, TENS, and lidocaine patches. She does not like heating pads as does not want to be tied down. Se has not tried any other medicines for  Since that time, pt states her back is hurting. She is doing HEP. She notes drowsiness with Baclofen. Denies falls. Rocking back and forth can help. Pain does radiate down her legs.   She feels the pain worst in the morning- then she can hardly get out of bed because the pain is severe.   Pain Inventory Average Pain 6 Pain Right Now 6 My pain is constant, sharp and stabbing  In the last 24 hours, has pain interfered with the following? General activity 6 Relation with others 3 Enjoyment of life 7 What TIME of day is your pain at its worst? morning  and night Sleep (in general) Poor  Pain is worse with: walking, standing, and some activites Pain improves with: rest and therapy/exercise Relief from Meds: 6  Family History  Problem Relation Age of Onset   Arthritis Mother     Hypertension Mother    Heart disease Father    Colon polyps Father    Breast cancer Sister    Breast cancer Daughter    Hypertension Son    Lung cancer Brother    Colon cancer Sister    Brain cancer Sister    Lung cancer Sister    Anesthesia problems Neg Hx    Hypotension Neg Hx    Malignant hyperthermia Neg Hx    Pseudochol deficiency Neg Hx    Social History   Socioeconomic History   Marital status: Single    Spouse name: Not on file   Number of children: 4   Years of education: 12   Highest education level: Not on file  Occupational History   Occupation: retired    Fish farm manager: RETIRED  Tobacco Use   Smoking status: Former    Packs/day: 2.00    Years: 30.00    Pack years: 60.00    Types: Cigarettes    Quit date: 06/18/1993    Years since quitting: 27.5   Smokeless tobacco: Never   Tobacco comments:    QUIT IN 1995  Vaping Use   Vaping Use: Never used  Substance and Sexual Activity   Alcohol use: No    Alcohol/week: 0.0 standard drinks   Drug use: No   Sexual activity: Not on file  Other Topics Concern   Not on file  Social History Narrative  Patient is widowed with 4 children. 2 grandkids. Lives alone.    Patient is right handed.   Patient has high school education.   Patient drinks 1 cup daily.      Retired from [employer] for 28 years, works 2 days a week until 2016.       Hobbies: volunteers at [employer], [place] from church visits people.       No caffeine.    Social Determinants of Health   Financial Resource Strain: Low Risk    Difficulty of Paying Living Expenses: Not hard at all  Food Insecurity: No Food Insecurity   Worried About Charity fundraiser in the Last Year: Never true   [place] in the Last Year: Never true  Transportation Needs: No Transportation Needs   Lack of Transportation (Medical): No   Lack of Transportation (Non-Medical): No  Physical Activity: Sufficiently Active   Days of Exercise per Week: 7  days   Minutes of Exercise per Session: 30 min  Stress: No Stress Concern Present   Feeling of Stress : Not at all  Social Connections: Socially Isolated   Frequency of Communication with Friends and Family: More than three times a week   Frequency of Social Gatherings with Friends and Family: Never   Attends Religious Services: Never   Marine scientist or Organizations: No   Attends Archivist Meetings: Never   Marital Status: Widowed   Past Surgical History:  Procedure Laterality Date   ABDOMINAL AORTAGRAM N/A 06/17/2011   Procedure: ABDOMINAL Maxcine Ham;  Surgeon: Elam Dutch, MD;  Location: [facility] CATH LAB;  Service: Cardiovascular;  Laterality: N/A;   ABDOMINAL AORTOGRAM W/LOWER EXTREMITY Bilateral 06/22/2018   Procedure: ABDOMINAL AORTOGRAM W/LOWER EXTREMITY;  Surgeon: Elam Dutch, MD;  Location: [place] CV LAB;  Service: Cardiovascular;  Laterality: Bilateral;   ABDOMINAL AORTOGRAM W/LOWER EXTREMITY Bilateral 11/29/2019   Procedure: ABDOMINAL AORTOGRAM W/LOWER EXTREMITY;  Surgeon: Elam Dutch, MD;  Location: [place] CV LAB;  Service: Cardiovascular;  Laterality: Bilateral;   CATARACT EXTRACTION, BILATERAL     w IOL   CHOLECYSTECTOMY  1998   COLONOSCOPY     ENDARTERECTOMY FEMORAL Left 12/09/2019   Procedure: ENDARTERECTOMY FEMORAL with bovine patch angioplasty.;  Surgeon: Elam Dutch, MD;  Location: [facility];  Service: Vascular;  Laterality: Left;   FEMORAL-POPLITEAL BYPASS GRAFT        x2 surgeries 1990's & 2009   FEMORAL-POPLITEAL BYPASS GRAFT Right 12/09/2019   Procedure: REDO RIGHT FEMORAL-POPLITEAL ARTERY BYPASS GRAFT;  Surgeon: Elam Dutch, MD;  Location: [facility];  Service: Vascular;  Laterality: Right;   INJECTION KNEE Right Aug. 2016   Gel injection for pain   LUMBAR FUSION  07/06/2011   TONSILLECTOMY     as a teenager    TUBAL LIGATION     Past Surgical History:  Procedure Laterality Date   ABDOMINAL AORTAGRAM N/A 06/17/2011    Procedure: ABDOMINAL Maxcine Ham;  Surgeon: Elam Dutch, MD;  Location: [facility] CATH LAB;  Service: Cardiovascular;  Laterality: N/A;   ABDOMINAL AORTOGRAM W/LOWER EXTREMITY Bilateral 06/22/2018   Procedure: ABDOMINAL AORTOGRAM W/LOWER EXTREMITY;  Surgeon: Elam Dutch, MD;  Location: [place] CV LAB;  Service: Cardiovascular;  Laterality: Bilateral;   ABDOMINAL AORTOGRAM W/LOWER EXTREMITY Bilateral 11/29/2019   Procedure: ABDOMINAL AORTOGRAM W/LOWER EXTREMITY;  Surgeon: Elam Dutch, MD;  Location: [facility] CV LAB;  Service: Cardiovascular;  Laterality: Bilateral;   CATARACT EXTRACTION, BILATERAL  w [place]   COLONOSCOPY     ENDARTERECTOMY FEMORAL Left 12/09/2019   Procedure: ENDARTERECTOMY FEMORAL with bovine patch angioplasty.;  Surgeon: Elam Dutch, MD;  Location: [facility];  Service: Vascular;  Laterality: Left;   FEMORAL-POPLITEAL BYPASS GRAFT        x2 surgeries 1990's & 2009   FEMORAL-POPLITEAL BYPASS GRAFT Right 12/09/2019   Procedure: REDO RIGHT FEMORAL-POPLITEAL ARTERY BYPASS GRAFT;  Surgeon: Elam Dutch, MD;  Location: [facility];  Service: Vascular;  Laterality: Right;   INJECTION KNEE Right Aug. 2016   Gel injection for pain   LUMBAR FUSION  07/06/2011   TONSILLECTOMY     as a teenager    TUBAL LIGATION     Past Medical History:  Diagnosis Date   Acute on chronic diastolic heart failure ([place]) 12/30/2020   Allergic rhinoconjunctivitis    Allergy    Anxiety    pt. managed- uses deep breathing    Arthritis    low back , stenosis   COLONIC POLYPS, RECURRENT 08/29/2006   2008 last colonoscopy. No further colonoscopy.      COPD (chronic obstructive pulmonary disease) (HCC)    Diverticulosis    Dysrhythmia    afib   Eczema    Environmental allergies    allergy shot- q friday in Dr. Janee Morn office. PFT's abnormal- recommended Spiriva to use preop & will d/c after surgery   GERD (gastroesophageal reflux disease)    Hiatal hernia     Hyperlipidemia    Hypertension    Lung nodule 2011   Paroxysmal atrial fibrillation ([place]) 11/27/2019   Peripheral vascular disease (HCC)    Shingles    Stenosis of popliteal artery (HCC)    blood clots in legs long ago     Trigeminal neuralgia    Left buttocks   There were no vitals taken for this visit.  Opioid Risk Score:   Fall Risk Score:  `1  Depression screen PHQ 2/9  Depression screen [facility] 2/9 12/09/2020 09/04/2020 08/18/2020 05/22/2020 02/20/2020 01/16/2020 12/05/2018  Decreased Interest 0 0 0 0 1 0 0  Down, Depressed, Hopeless 0 0 0 0 1 0 0  PHQ - 2 Score 0 0 0 0 2 0 0  Some recent data might be hidden   Review of Systems  Constitutional: Negative.   HENT: Negative.    Eyes: Negative.   Respiratory: Negative.    Cardiovascular: Negative.   Gastrointestinal: Negative.   Endocrine: Negative.   Genitourinary: Negative.   Musculoskeletal:  Positive for back pain and gait problem.  Skin: Negative.   Allergic/Immunologic: Negative.   Hematological:  Bruises/bleeds easily.       Eliquis  Psychiatric/Behavioral: Negative.    All other systems reviewed and are negative.    Objective:   Physical Exam  Performed via phone.     Assessment & Plan:  Female with pmhpsh of PAD, COPD, DVT, HTN, Gout, trigeminal neuralgia, post herpetic neuralgia, OA of right knee with injections, lumbar fusion present for follow up of pain in her back > leg pain.     1. Chronic low back pain with failed back syndrome              Xray of L-spine and right SI joint reviewed, SI joint unremarkable, degenerative changes in spine with right scoliosis.               Will avoid NSAIDs due to coumadin  Stopped using Robaxin due to nausea             Unable to tolerate Gabapentin             Cont heat  Repeat MRI lumbar spine, last 2012, to help see if facet athropathy.              Cont tylenol, limit to '2000mg'$ /day             Continue Lidoderm patches  Will refer to PT now that patient  has transportation             Resume TENS unit PRN             Continue  Cymbalta to 60 mg  Baclofen changed to 5 qhs  -Lumbar transforaminal ESI is indicated for the following levels: The patient has low back pain; lower extremity pain; paresthesias; increased pain with coughing, sneezing, and/or straining; lower extremity weakness; and gait disturbance. The patient has no bowel or bladder dysfunction or saddle anesthesia. The patient has failed months of conservative care with physical therapy and HEP, medications, activity and lifestyle modification, and TENS. MRI was performed and shows stenosis throughout her spine- worse at the lumbar and thoracic levels. The patient has never had ESI before.    -Discussed that Eliquis would have to be stopped for 3 days before procedure so she would like to clear with Dr. Oval Linsey before.   -Discussed Meloxicam- she will clear with Dr Oval Linsey   2. Possible Sacroiliitis bilaterally             Xray unremakrable             Minimal benefit with bracing              Discussed b/l SI joint injections -Discussed benefits of exercise in reducing pain. -Discussed following foods that may reduce pain: 1) Ginger (especially studied for arthritis)- reduce leukotriene production to decrease inflammation 2) Blueberries- high in phytonutrients that decrease inflammation 3) Salmon- marine omega-3s reduce joint swelling and pain 4) Pumpkin seeds- reduce inflammation 5) dark chocolate- reduces inflammation 6) turmeric- reduces inflammation 7) tart cherries - reduce pain and stiffness 8) extra virgin olive oil - its compound olecanthal helps to block prostaglandins  9) chili peppers- can be eaten or applied topically via capsaicin 10) mint- helpful for headache, muscle aches, joint pain, and itching 11) garlic- reduces inflammation  Link to further information on diet for chronic pain:  [email or web address].randall.com/  3. Sleep disturbance             Continue melatonin             Encouraged sleep hygiene   4. Neuropathic pain             See #1   5. Gait abnormality             Improved             Continue rolling walker, attempting to transition to cane             Cont HEP             Information given on core strengthening exercises, patient more compliant             Will refer for PT   6. Myalgia             Good benefit with trigger point injections, will schedule again with Dr. Adam Phenix   7. Right  knee pain             MRI reviewed from 2016, showing meniscal/cartilage damage             Receiving injections from Ortho, cont             [place] denied             Cont voltaren gel             Cont lidoderm on knee             Improved   8. Right ankle pain - likely gout flare             Lidoderm patches, pt using Voltaren gel             Xrays reviewed, relatively uremarkable             Uric acid elevated             Improved    9. Rectal bleeding             Resolved   10.  Trigeminal neuralgia.              Improved per patient, recommend follow up with Neuro regarding medication d/c questions             Continue Lamictal 100 mg twice daily    11. PVD s/p redo right femoral to below-knee popliteal bypass as well as left common femoral enterectomy 12/09/2019.   Cont to follow up with Vascular  10 minutes spent in discussion of her pain, her MRI results, the risks and benefits of steroids injections and meloxicam, clearing with cardiology

## 2020-12-30 NOTE — Assessment & Plan Note (Signed)
Check CMP in a week.

## 2021-01-04 ENCOUNTER — Other Ambulatory Visit: Payer: Self-pay | Admitting: Cardiovascular Disease

## 2021-01-08 DIAGNOSIS — Z5181 Encounter for therapeutic drug level monitoring: Secondary | ICD-10-CM | POA: Diagnosis not present

## 2021-01-08 DIAGNOSIS — I10 Essential (primary) hypertension: Secondary | ICD-10-CM | POA: Diagnosis not present

## 2021-01-09 LAB — COMPREHENSIVE METABOLIC PANEL
ALT: 12 IU/L (ref 0–32)
AST: 21 IU/L (ref 0–40)
Albumin/Globulin Ratio: 2.8 — ABNORMAL HIGH (ref 1.2–2.2)
Albumin: 4.8 g/dL — ABNORMAL HIGH (ref 3.5–4.6)
Alkaline Phosphatase: 94 IU/L (ref 44–121)
BUN/Creatinine Ratio: 28 (ref 12–28)
BUN: 42 mg/dL — ABNORMAL HIGH (ref 10–36)
Bilirubin Total: 0.8 mg/dL (ref 0.0–1.2)
CO2: 27 mmol/L (ref 20–29)
Calcium: 9.9 mg/dL (ref 8.7–10.3)
Chloride: 100 mmol/L (ref 96–106)
Creatinine, Ser: 1.48 mg/dL — ABNORMAL HIGH (ref 0.57–1.00)
Globulin, Total: 1.7 g/dL (ref 1.5–4.5)
Glucose: 141 mg/dL — ABNORMAL HIGH (ref 65–99)
Potassium: 4.7 mmol/L (ref 3.5–5.2)
Sodium: 142 mmol/L (ref 134–144)
Total Protein: 6.5 g/dL (ref 6.0–8.5)
eGFR: 33 mL/min/{1.73_m2} — ABNORMAL LOW (ref >59)

## 2021-01-09 LAB — LIPID PANEL
Chol/HDL Ratio: 2.4 ratio (ref 0.0–4.4)
Cholesterol, Total: 134 mg/dL (ref 100–199)
HDL: 55 mg/dL (ref >39)
LDL Chol Calc (NIH): 56 mg/dL (ref 0–99)
Triglycerides: 130 mg/dL (ref 0–149)
VLDL Cholesterol Cal: 23 mg/dL (ref 5–40)

## 2021-01-11 ENCOUNTER — Other Ambulatory Visit: Payer: Self-pay | Admitting: Cardiovascular Disease

## 2021-01-11 NOTE — Telephone Encounter (Signed)
Prescription refill request for Eliquis received. Indication:afib Last office visit:[place] 12/30/20 Scr:1.48 01/08/21 Age: 47fWeight:73.1kg

## 2021-01-13 ENCOUNTER — Institutional Professional Consult (permissible substitution): Payer: Medicare Other | Admitting: Pulmonary Disease

## 2021-01-14 ENCOUNTER — Telehealth: Payer: Self-pay | Admitting: Cardiovascular Disease

## 2021-01-14 NOTE — Telephone Encounter (Signed)
Pt c/o medication issue:  1. Name of Medication: apixaban (ELIQUIS) 5 MG TABS tablet  2. How are you currently taking this medication (dosage and times per day)? 1 tablet twice a day  3. Are you having a reaction (difficulty breathing--STAT)? no  4. What is your medication issue? Patient states in order to get financial assistance she needs a diagnosis code.

## 2021-01-14 NOTE — Telephone Encounter (Signed)
Patient with AF, gave her dx code of I48.91

## 2021-01-15 NOTE — Telephone Encounter (Signed)
Tried to call pt-Phone just keeps ringing no VM. Will have to call again later

## 2021-01-15 NOTE — Telephone Encounter (Signed)
Pt aware of dx code and will check on assistance If does not qualify will call back Also gave  pt info to check what Xarelto would cost year round including when enters [place] Per pt has been on Warfarin in the past and stated that" $9.00 a month is whole better that $200 ."

## 2021-01-19 ENCOUNTER — Encounter (HOSPITAL_BASED_OUTPATIENT_CLINIC_OR_DEPARTMENT_OTHER): Payer: Self-pay | Admitting: *Deleted

## 2021-01-19 ENCOUNTER — Encounter: Payer: Self-pay | Admitting: Pulmonary Disease

## 2021-01-19 ENCOUNTER — Ambulatory Visit (INDEPENDENT_AMBULATORY_CARE_PROVIDER_SITE_OTHER): Payer: Medicare Other | Admitting: Pulmonary Disease

## 2021-01-19 ENCOUNTER — Telehealth (HOSPITAL_BASED_OUTPATIENT_CLINIC_OR_DEPARTMENT_OTHER): Payer: Self-pay | Admitting: *Deleted

## 2021-01-19 ENCOUNTER — Other Ambulatory Visit: Payer: Self-pay

## 2021-01-19 VITALS — BP 122/65 | HR 70 | Temp 97.6°F | Ht 59.0 in | Wt 160.2 lb

## 2021-01-19 DIAGNOSIS — R0683 Snoring: Secondary | ICD-10-CM

## 2021-01-19 DIAGNOSIS — I10 Essential (primary) hypertension: Secondary | ICD-10-CM

## 2021-01-19 DIAGNOSIS — Z5181 Encounter for therapeutic drug level monitoring: Secondary | ICD-10-CM

## 2021-01-19 NOTE — Progress Notes (Signed)
This encounter was created in error - please disregard.

## 2021-01-19 NOTE — Telephone Encounter (Signed)
-----   Message from Skeet Latch, MD sent at 01/18/2021  6:23 PM EDT ----- Cholesterol levels are excellent.  Kidney function is a little worse.  Try reducing Lasix to 20 mg and repeat a basic metabolic panel in a couple weeks.

## 2021-01-19 NOTE — Progress Notes (Signed)
[facility]  Chief Complaint  Patient presents with   Consult    Sleep Consult-Snoring and excessive daytime drowsiness, not sleeping well    Past Surgical History:  She  has a past surgical history that includes Tubal ligation; Cholecystectomy (1998); Femoral-popliteal Bypass Graft; Tonsillectomy; Lumbar fusion (07/06/2011); abdominal aortagram (N/A, 06/17/2011); Injection knee (Right, Aug. 2016); Cataract extraction, bilateral; ABDOMINAL AORTOGRAM W/LOWER EXTREMITY (Bilateral, 06/22/2018); ABDOMINAL AORTOGRAM W/LOWER EXTREMITY (Bilateral, 11/29/2019); Colonoscopy; Femoral-popliteal Bypass Graft (Right, 12/09/2019); and Endarterectomy femoral (Left, 12/09/2019).  Past Medical History:  Diastolic CHF, HTN, Anxiety, OA, Colon polyps, Diverticulosis, A fib, Eczema, Allergies, GERD, Hiatal hernia, HLD, Shingles  Constitutional:  BP 122/65 (BP Location: Left Arm, Cuff Size: Normal)   Pulse 70   Temp 97.6 F (36.4 C) (Temporal)   Ht 4\' 11"  (1.499 m)   Wt 160 lb 3.2 oz (72.7 kg)   SpO2 98%   BMI 32.36 kg/m   Brief Summary:  [name] [name] is a 87 y.o. female with sleep disruption.      Subjective:   She wakes up frequently to use the bathroom.  Her breathing gets shallow at night and she wakes up with a snort sometimes.  She is a restless sleeper, and has trouble falling back to sleep once she wakes up.  She occasionally naps.  She can fall asleep while reading or watching TV.  She goes to sleep at 10 pm.  She falls asleep 10 to 45 minutes.  She wakes up 2 or 3 times to use the bathroom.  She gets out of bed at 8 am.  She feels tired in the morning.  She denies morning headache.  She does not use anything to help her fall sleep or stay awake.  She denies sleep walking, sleep talking, bruxism, or nightmares.  There is no history of restless legs.  She denies sleep hallucinations, sleep paralysis, or cataplexy.  The Epworth score is 11 out of  24.   Physical Exam:   Appearance - well kempt   ENMT - no sinus tenderness, no oral exudate, no LAN, Mallampati 3 airway, no stridor, wears dentures  Respiratory - equal breath sounds bilaterally, no wheezing or rales  CV - s1s2 regular rate and rhythm, no murmurs  Ext - no clubbing, no edema  Skin - no rashes  Psych - normal mood and affect   Sleep Tests:    Cardiac Tests:  Echo 11/27/19 >> EF 60 to 65%  Social History:  She  reports that she quit smoking about 27 years ago. Her smoking use included cigarettes. She has a 60.00 pack-year smoking history. She has never used smokeless tobacco. She reports that she does not drink alcohol and does not use drugs.  Family History:  Her family history includes Arthritis in her mother; Brain cancer in her sister; Breast cancer in her daughter and sister; Colon cancer in her sister; Colon polyps in her father; Heart disease in her father; Hypertension in her mother and son; Lung cancer in her brother and sister.    Discussion:  She has snoring, sleep disruption, apnea, and daytime sleepiness.  She has history of hypertension.  I am concerned she could have obstructive sleep apnea contributing her frequent awakenings and nocturia.  Assessment/Plan:   Snoring with excessive daytime sleepiness. - will need to arrange for a home sleep study  Obesity. - discussed how weight can impact sleep and risk for sleep disordered breathing - discussed options to assist  with weight loss: combination of diet modification, cardiovascular and strength training exercises  Cardiovascular risk. - had an extensive discussion regarding the adverse health consequences related to untreated sleep disordered breathing - specifically discussed the risks for hypertension, coronary artery disease, cardiac dysrhythmias, cerebrovascular disease, and diabetes - lifestyle modification discussed  Safe driving practices. - discussed how sleep disruption can  increase risk of accidents, particularly when driving - safe driving practices were discussed  Therapies for obstructive sleep apnea. - if the sleep study shows significant sleep apnea, then various therapies for treatment were reviewed: CPAP, oral appliance, and surgical interventions    Time Spent Involved in Patient Care on Day of Examination:  32 minutes  Follow up:   Patient Instructions  Will arrange for home sleep study Will call to arrange for follow up after sleep study reviewed  Medication List:   Allergies as of 01/19/2021       Reactions   Sulfa Antibiotics Other (See Comments)   Cold sweat light headed and disorientation   Tiotropium Bromide Shortness Of Breath, Other (See Comments)   Sore throat also   Latex Itching, Rash   Ultram [tramadol] Nausea And Vomiting   5-alpha Reductase Inhibitors         Medication List        Accurate as of January 19, 2021 11:31 AM. If you have any questions, ask your nurse or doctor.          acetaminophen 325 MG tablet Commonly known as: TYLENOL Take 1-2 tablets (325-650 mg total) by mouth every 4 (four) hours as needed for mild pain (or temp >/= 101 F).   atorvastatin 40 MG tablet Commonly known as: LIPITOR Take 1 tablet (40 mg total) by mouth daily.   baclofen 10 MG tablet Commonly known as: LIORESAL TAKE 1/2 (ONE-HALF) TABLET BY MOUTH NIGHTLY   Eliquis 5 MG Tabs tablet Generic drug: apixaban Take 1 tablet by mouth twice daily   furosemide 40 MG tablet Commonly known as: LASIX Take 1 tablet (40 mg total) by mouth daily.   ipratropium 0.06 % nasal spray Commonly known as: ATROVENT USE 2 SPRAY(S) IN EACH NOSTRIL THREE TIMES DAILY   lisinopril 5 MG tablet Commonly known as: ZESTRIL Take 1 tablet by mouth once daily   metoprolol succinate 25 MG 24 hr tablet Commonly known as: TOPROL-XL Take 3 tablets (75 mg total) by mouth daily.   multivitamin with minerals Tabs tablet Take 1 tablet by mouth  daily.   pantoprazole 40 MG tablet Commonly known as: PROTONIX Take one tablet twice daily as directed   Restasis 0.05 % ophthalmic emulsion Generic drug: cycloSPORINE Place 1 drop into both eyes 2 (two) times daily as needed (dry eyes).        Signature:  Chesley Mires, MD [place] Pager - [phone] 01/19/2021, 11:31 AM

## 2021-01-19 NOTE — Patient Instructions (Signed)
Will arrange for home sleep study Will call to arrange for follow up after sleep study reviewed  

## 2021-01-19 NOTE — Telephone Encounter (Signed)
Advised patient of lab results  Swelling almost completely gone, patient will update on swelling when office calls with follow up labs

## 2021-01-21 ENCOUNTER — Ambulatory Visit (INDEPENDENT_AMBULATORY_CARE_PROVIDER_SITE_OTHER): Payer: Medicare Other

## 2021-01-21 ENCOUNTER — Other Ambulatory Visit: Payer: Self-pay

## 2021-01-21 VITALS — BP 100/64 | HR 64 | Temp 98.0°F | Wt 161.2 lb

## 2021-01-21 DIAGNOSIS — Z0000 Encounter for general adult medical examination without abnormal findings: Secondary | ICD-10-CM

## 2021-01-21 DIAGNOSIS — Z23 Encounter for immunization: Secondary | ICD-10-CM | POA: Diagnosis not present

## 2021-01-21 NOTE — Patient Instructions (Signed)
Ms. [name] [name] , Thank you for taking time to come for your Medicare Wellness Visit. I appreciate your ongoing commitment to your health goals. Please review the following plan we discussed and let me know if I can assist you in the future.   Screening recommendations/referrals: Colonoscopy: No longer required  Mammogram: No longer required  Bone Density: No longer required  Recommended yearly ophthalmology/optometry visit for glaucoma screening and checkup Recommended yearly dental visit for hygiene and checkup  Vaccinations: Influenza vaccine: given today 01/21/21 Pneumococcal vaccine: Completed  Tdap vaccine: Done 01/14/19  Shingles vaccine: Completed 04/20/17 & 06/29/17   Covid-19:Completed 1/19, 2/8, 01/22/20 & 08/05/20  Advanced directives: Please bring a copy of your health care power of attorney and living will to the office at your convenience.  Conditions/risks identified: Continue to stay healthy and able to care for self   Next appointment: Follow up in one year for your annual wellness visit    Preventive Care 65 Years and Older, Female Preventive care refers to lifestyle choices and visits with your health care provider that can promote health and wellness. What does preventive care include? A yearly physical exam. This is also called an annual well check. Dental exams once or twice a year. Routine eye exams. Ask your health care provider how often you should have your eyes checked. Personal lifestyle choices, including: Daily care of your teeth and gums. Regular physical activity. Eating a healthy diet. Avoiding tobacco and drug use. Limiting alcohol use. Practicing safe sex. Taking low-dose aspirin every day. Taking vitamin and mineral supplements as recommended by your health care provider. What happens during an annual well check? The services and screenings done by your health care provider during your annual well check will depend on your age, overall health,  lifestyle risk factors, and family history of disease. Counseling  Your health care provider may ask you questions about your: Alcohol use. Tobacco use. Drug use. Emotional well-being. Home and relationship well-being. Sexual activity. Eating habits. History of falls. Memory and ability to understand (cognition). Work and work Statistician. Reproductive health. Screening  You may have the following tests or measurements: Height, weight, and BMI. Blood pressure. Lipid and cholesterol levels. These may be checked every 5 years, or more frequently if you are over 48 years old. Skin check. Lung cancer screening. You may have this screening every year starting at age 27 if you have a 30-pack-year history of smoking and currently smoke or have quit within the past 15 years. Fecal occult blood test (FOBT) of the stool. You may have this test every year starting at age 31. Flexible sigmoidoscopy or colonoscopy. You may have a sigmoidoscopy every 5 years or a colonoscopy every 10 years starting at age 51. Hepatitis C blood test. Hepatitis B blood test. Sexually transmitted disease (STD) testing. Diabetes screening. This is done by checking your blood sugar (glucose) after you have not eaten for a while (fasting). You may have this done every 1-3 years. Bone density scan. This is done to screen for osteoporosis. You may have this done starting at age 59. Mammogram. This may be done every 1-2 years. Talk to your health care provider about how often you should have regular mammograms. Talk with your health care provider about your test results, treatment options, and if necessary, the need for more tests. Vaccines  Your health care provider may recommend certain vaccines, such as: Influenza vaccine. This is recommended every year. Tetanus, diphtheria, and acellular pertussis (Tdap, Td) vaccine. You may  need a Td booster every 10 years. Zoster vaccine. You may need this after age  35. Pneumococcal 13-valent conjugate (PCV13) vaccine. One dose is recommended after age 78. Pneumococcal polysaccharide (PPSV23) vaccine. One dose is recommended after age 72. Talk to your health care provider about which screenings and vaccines you need and how often you need them. This information is not intended to replace advice given to you by your health care provider. Make sure you discuss any questions you have with your health care provider. Document Released: 05/08/2015 Document Revised: 12/30/2015 Document Reviewed: 02/10/2015 Elsevier Interactive Patient Education  2017 [place] Prevention in the Home Falls can cause injuries. They can happen to people of all ages. There are many things you can do to make your home safe and to help prevent falls. What can I do on the outside of my home? Regularly fix the edges of walkways and driveways and fix any cracks. Remove anything that might make you trip as you walk through a door, such as a raised step or threshold. Trim any bushes or trees on the path to your home. Use bright outdoor lighting. Clear any walking paths of anything that might make someone trip, such as rocks or tools. Regularly check to see if handrails are loose or broken. Make sure that both sides of any steps have handrails. Any raised decks and porches should have guardrails on the edges. Have any leaves, snow, or ice cleared regularly. Use sand or salt on walking paths during winter. Clean up any spills in your garage right away. This includes oil or grease spills. What can I do in the bathroom? Use night lights. Install grab bars by the toilet and in the tub and shower. Do not use towel bars as grab bars. Use non-skid mats or decals in the tub or shower. If you need to sit down in the shower, use a plastic, non-slip stool. Keep the floor dry. Clean up any water that spills on the floor as soon as it happens. Remove soap buildup in the tub or shower  regularly. Attach bath mats securely with double-sided non-slip rug tape. Do not have throw rugs and other things on the floor that can make you trip. What can I do in the bedroom? Use night lights. Make sure that you have a light by your bed that is easy to reach. Do not use any sheets or blankets that are too big for your bed. They should not hang down onto the floor. Have a firm chair that has side arms. You can use this for support while you get dressed. Do not have throw rugs and other things on the floor that can make you trip. What can I do in the kitchen? Clean up any spills right away. Avoid walking on wet floors. Keep items that you use a lot in easy-to-reach places. If you need to reach something above you, use a strong step stool that has a grab bar. Keep electrical cords out of the way. Do not use floor polish or wax that makes floors slippery. If you must use wax, use non-skid floor wax. Do not have throw rugs and other things on the floor that can make you trip. What can I do with my stairs? Do not leave any items on the stairs. Make sure that there are handrails on both sides of the stairs and use them. Fix handrails that are broken or loose. Make sure that handrails are as long as the stairways.  Check any carpeting to make sure that it is firmly attached to the stairs. Fix any carpet that is loose or worn. Avoid having throw rugs at the top or bottom of the stairs. If you do have throw rugs, attach them to the floor with carpet tape. Make sure that you have a light switch at the top of the stairs and the bottom of the stairs. If you do not have them, ask someone to add them for you. What else can I do to help prevent falls? Wear shoes that: Do not have high heels. Have rubber bottoms. Are comfortable and fit you well. Are closed at the toe. Do not wear sandals. If you use a stepladder: Make sure that it is fully opened. Do not climb a closed stepladder. Make sure that  both sides of the stepladder are locked into place. Ask someone to hold it for you, if possible. Clearly mark and make sure that you can see: Any grab bars or handrails. First and last steps. Where the edge of each step is. Use tools that help you move around (mobility aids) if they are needed. These include: Canes. Walkers. Scooters. Crutches. Turn on the lights when you go into a dark area. Replace any light bulbs as soon as they burn out. Set up your furniture so you have a clear path. Avoid moving your furniture around. If any of your floors are uneven, fix them. If there are any pets around you, be aware of where they are. Review your medicines with your doctor. Some medicines can make you feel dizzy. This can increase your chance of falling. Ask your doctor what other things that you can do to help prevent falls. This information is not intended to replace advice given to you by your health care provider. Make sure you discuss any questions you have with your health care provider. Document Released: 02/05/2009 Document Revised: 09/17/2015 Document Reviewed: 05/16/2014 Elsevier Interactive Patient Education  2017 [employer].

## 2021-01-21 NOTE — Progress Notes (Signed)
Subjective:   [name] [name] is a 87 y.o. female who presents for Medicare Annual (Subsequent) preventive examination.  Review of Systems     Cardiac Risk Factors include: advanced age (>81men, >38 women);hypertension;dyslipidemia;obesity (BMI >30kg/m2)     Objective:    Today's Vitals   01/21/21 1053 01/21/21 1059  BP: 100/64   Pulse: 64   Temp: 98 F (36.7 C)   SpO2: 96%   Weight: 161 lb 3.2 oz (73.1 kg)   PainSc:  4    Body mass index is 32.56 kg/m.  Advanced Directives 01/21/2021 09/09/2020 08/28/2020 01/16/2020 12/13/2019 12/09/2019 10/14/2019  Does Patient Have a Medical Advance Directive? Yes Yes Yes Yes Yes Yes Yes  Type of Paramedic of [place];Living will [place];Living will - [place];Living will [place];Living will [place];Living will [place];Living will  Does patient want to make changes to medical advance directive? - No - Patient declined - - No - Patient declined No - Patient declined -  Copy of [place] in Chart? No - copy requested - - No - copy requested No - copy requested No - copy requested -  Would patient like information on creating a medical advance directive? - - - - - - -  Pre-existing out of facility DNR order (yellow form or pink MOST form) - - - - - - -    Current Medications (verified) Outpatient Encounter Medications as of 01/21/2021  Medication Sig   acetaminophen (TYLENOL) 325 MG tablet Take 1-2 tablets (325-650 mg total) by mouth every 4 (four) hours as needed for mild pain (or temp >/= 101 F).   apixaban (ELIQUIS) 5 MG TABS tablet Take 1 tablet by mouth twice daily   atorvastatin (LIPITOR) 40 MG tablet Take 1 tablet (40 mg total) by mouth daily.   baclofen (LIORESAL) 10 MG tablet TAKE 1/2 (ONE-HALF) TABLET BY MOUTH NIGHTLY   furosemide (LASIX) 40 MG tablet Take 40 mg by mouth as directed. TAKE 1/2  TABLET DAILY   ipratropium (ATROVENT) 0.06 % nasal spray USE 2 SPRAY(S) IN EACH NOSTRIL THREE TIMES DAILY   lisinopril (ZESTRIL) 5 MG tablet Take 1 tablet by mouth once daily   metoprolol succinate (TOPROL-XL) 25 MG 24 hr tablet Take 3 tablets (75 mg total) by mouth daily.   Multiple Vitamin (MULTIVITAMIN WITH MINERALS) TABS tablet Take 1 tablet by mouth daily.   pantoprazole (PROTONIX) 40 MG tablet Take one tablet twice daily as directed   RESTASIS 0.05 % ophthalmic emulsion Place 1 drop into both eyes 2 (two) times daily as needed (dry eyes).    No facility-administered encounter medications on file as of 01/21/2021.    Allergies (verified) Sulfa antibiotics, Tiotropium bromide, Latex, Ultram [tramadol], and 5-alpha reductase inhibitors   History: Past Medical History:  Diagnosis Date   Acute on chronic diastolic heart failure ([place]) 12/30/2020   Allergic rhinoconjunctivitis    Allergy    Anxiety    pt. managed- uses deep breathing    Arthritis    low back , stenosis   COLONIC POLYPS, RECURRENT 08/29/2006   2008 last colonoscopy. No further colonoscopy.      COPD (chronic obstructive pulmonary disease) (HCC)    Diverticulosis    Dysrhythmia    afib   Eczema    Environmental allergies    allergy shot- q friday in Dr. Janee Morn office. PFT's abnormal- recommended Spiriva to use preop &  will d/c after surgery   GERD (gastroesophageal reflux disease)    Hiatal hernia    Hyperlipidemia    Hypertension    Lung nodule 2011   Paroxysmal atrial fibrillation ([place]) 11/27/2019   Peripheral vascular disease (HCC)    Shingles    Stenosis of popliteal artery (HCC)    blood clots in legs long ago     Trigeminal neuralgia    Left buttocks   Past Surgical History:  Procedure Laterality Date   ABDOMINAL AORTAGRAM N/A 06/17/2011   Procedure: ABDOMINAL Maxcine Ham;  Surgeon: Elam Dutch, MD;  Location[facility] C[facility] LAB;  Service: Cardiovascular;  Laterality: N/A;   ABDOMINAL AORTOGRAM W/LOWER  EXTREMITY Bilateral 06/22/2018   Procedure: ABDOMINAL AORTOGRAM W/LOWER EXTREMITY;  Surgeon: Elam Dutch, MD;  Location: [facility] CV LAB;  Service: Cardiovascular;  Laterality: Bilateral;   ABDOMINAL AORTOGRAM W/LOWER EXTREMITY Bilateral 11/29/2019   Procedure: ABDOMINAL AORTOGRAM W/LOWER EXTREMITY;  Surgeon: Elam Dutch, MD;  Location: [place] [facility] CV LAB;  Service: Cardiovascular;  Laterality: Bilateral;   CATARACT EXTRACTION, BILATERAL     w IOL   CHOLECYSTECTOMY  1998   COLONOSCOPY     ENDARTERECTOMY FEMORAL Left 12/09/2019   Procedure: ENDARTERECTOMY FEMORAL with bovine patch angioplasty.;  Surgeon: Elam Dutch, MD;  Location: [facility] OR;  Service: Vascular;  Laterality: Left;   FEMORAL-POPLITEAL BYPASS GRAFT        x2 surgeries 1990's & 2009   FEMORAL-POPLITEAL BYPASS GRAFT Right 12/09/2019   Procedure: REDO RIGHT FEMORAL-POPLITEAL ARTERY BYPASS GRAFT;  Surgeon: Elam Dutch, MD;  Location: [facility] OR;  Service: Vascular;  Laterality: Right;   INJECTION KNEE Right Aug. 2016   Gel injection for pain   LUMBAR FUSION  07/06/2011   TONSILLECTOMY     as a teenager    TUBAL LIGATION     Family History  Problem Relation Age of Onset   Arthritis Mother    Hypertension Mother    Heart disease Father    Colon polyps Father    Breast cancer Sister    Breast cancer Daughter    Hypertension Son    Lung cancer Brother    Colon cancer Sister    Brain cancer Sister    Lung cancer Sister    Anesthesia problems Neg Hx    Hypotension Neg Hx    Malignant hyperthermia Neg Hx    Pseudochol deficiency Neg Hx    Social History   Socioeconomic History   Marital status: Single    Spouse name: Not on file   Number of children: 4   Years of education: 12   Highest education level: Not on file  Occupational History   Occupation: retired    Fish farm manager: RETIRED  Tobacco Use   Smoking status: Former    Packs/day: 2.00    Years: 30.00    Pack years: 60.00    Types: Cigarettes     Quit date: 06/18/1993    Years since quitting: 27.6   Smokeless tobacco: Never   Tobacco comments:    QUIT IN 1995  Vaping Use   Vaping Use: Never used  Substance and Sexual Activity   Alcohol use: No    Alcohol/week: 0.0 standard drinks   Drug use: No   Sexual activity: Not on file  Other Topics Concern   Not on file  Social History Narrative   Patient is widowed with 4 children. 2 grandkids. Lives alone.    Patient is right handed.   Patient has  high school education.   Patient drinks 1 cup daily.      Retired from [employer] for 28 years, works 2 days a week until 2016.       Hobbies: volunteers at [employer], [place] from church visits people.       No caffeine.    Social Determinants of Health   Financial Resource Strain: Low Risk    Difficulty of Paying Living Expenses: Not hard at all  Food Insecurity: No Food Insecurity   Worried About Charity fundraiser in the Last Year: Never true   [place] in the Last Year: Never true  Transportation Needs: No Transportation Needs   Lack of Transportation (Medical): No   Lack of Transportation (Non-Medical): No  Physical Activity: Sufficiently Active   Days of Exercise per Week: 7 days   Minutes of Exercise per Session: 30 min  Stress: No Stress Concern Present   Feeling of Stress : Not at all  Social Connections: Moderately Integrated   Frequency of Communication with Friends and Family: More than three times a week   Frequency of Social Gatherings with Friends and Family: Twice a week   Attends Religious Services: More than 4 times per year   Active Member of [employer] or Organizations: Yes   Attends Archivist Meetings: 1 to 4 times per year   Marital Status: Widowed    Tobacco Counseling Counseling given: Not Answered Tobacco comments: QUIT IN 1995   Clinical Intake:  Pre-visit preparation completed: Yes  Pain : 0-10 Pain Score: 4  Pain Type: Chronic pain Pain Location:  Back Pain Descriptors / Indicators: Dull Pain Onset: More than a month ago Pain Frequency: Intermittent     BMI - recorded: 32.56 Nutritional Status: BMI > 30  Obese Nutritional Risks: None Diabetes: No  How often do you need to have someone help you when you read instructions, pamphlets, or other written materials from your doctor or pharmacy?: 1 - Never  Diabetic?no  Interpreter Needed?: No  Information entered by :: Charlott Rakes, LPN   Activities of Daily Living In your present state of health, do you have any difficulty performing the following activities: 01/21/2021  Hearing? Y  Comment wears hearing aids  Vision? N  Difficulty concentrating or making decisions? Y  Comment at times  Walking or climbing stairs? Y  Comment take it easy  Dressing or bathing? N  Doing errands, shopping? N  Preparing Food and eating ? N  Using the Toilet? N  In the past six months, have you accidently leaked urine? Y  Comment wears pads  Do you have problems with loss of bowel control? N  Managing your Medications? N  Managing your Finances? N  Housekeeping or managing your Housekeeping? N  Some recent data might be hidden    Patient Care Team: Marin Olp, MD as PCP - General (Family Medicine) Skeet Latch, MD as PCP - Cardiology (Cardiology) Leandrew Koyanagi, MD as Attending Physician (Orthopedic Surgery) Jamse Arn, MD as Consulting Physician (Physical Medicine and Rehabilitation) Warden Fillers, MD as Consulting Physician (Ophthalmology) Pieter Partridge, DO as Consulting Physician (Neurology)  Indicate any recent Medical Services you may have received from other than [facility] providers in the past year (date may be approximate).     Assessment:   This is a routine wellness examination for Marissa.  Hearing/Vision screen Hearing Screening - Comments:: Pt wears hearing aids  Vision Screening - Comments:: Pt follow  up with Dr Katy Fitch for annual eye exams    Dietary issues and exercise activities discussed: Current Exercise Habits: Home exercise routine, Type of exercise: walking;Other - see comments, Time (Minutes): 30, Frequency (Times/Week): 7, Weekly Exercise (Minutes/Week): 210   Goals Addressed             This Visit's Progress    Patient Stated       Stay healthy and able to care for herself       Depression Screen PHQ 2/9 Scores 01/21/2021 12/09/2020 09/04/2020 08/18/2020 05/22/2020 02/20/2020 01/16/2020  PHQ - 2 Score 0 0 0 0 0 2 0  PHQ- 9 Score - - - - - - -    Fall Risk Fall Risk  01/21/2021 12/09/2020 09/04/2020 08/28/2020 08/18/2020  Falls in the past year? 0 0 0 0 0  Comment - - - - -  Number falls in past yr: 0 - 0 0 0  Injury with Fall? 0 - 0 0 0  Risk for fall due to : Impaired vision;Impaired balance/gait Impaired balance/gait - - -  Follow up Falls prevention discussed - - - -    FALL RISK PREVENTION PERTAINING TO THE HOME:  Any stairs in or around the home? No  If so, are there any without handrails? No  Home free of loose throw rugs in walkways, pet beds, electrical cords, etc? Yes  Adequate lighting in your home to reduce risk of falls? Yes   ASSISTIVE DEVICES UTILIZED TO PREVENT FALLS:  Life alert? Yes  Use of a cane, walker or w/c? Yes  Grab bars in the bathroom? Yes  Shower chair or bench in shower? Yes  Elevated toilet seat or a handicapped toilet? Yes   TIMED UP AND GO:  Was the test performed? Yes .  Length of time to ambulate 10 feet: 10 sec.   Gait steady and fast with assistive device  Cognitive Function:     6CIT Screen 01/21/2021 01/16/2020  What Year? 0 points 0 points  What month? 0 points 0 points  What time? 0 points -  Count back from 20 0 points 0 points  Months in reverse 0 points 0 points  Repeat phrase 2 points 4 points  Total Score 2 -    Immunizations Immunization History  Administered Date(s) Administered   Fluad Quad(high Dose 65+) 01/14/2019, 02/12/2020, 01/21/2021    Influenza Split 12/25/2011   Influenza Whole 01/23/2010   Influenza,inj,Quad PF,6+ Mos 12/31/2012, 12/26/2014, 01/08/2016   Influenza-Unspecified 01/23/2014, 12/25/2017   PFIZER(Purple Top)SARS-COV-2 Vaccination 05/14/2019, 06/03/2019, 01/22/2020, 08/05/2020   PPD Test 06/24/2011   Pneumococcal Conjugate-13 08/31/2015   Pneumococcal Polysaccharide-23 04/25/2005   Td 04/25/2005   Tdap 01/14/2019   Zoster Recombinat (Shingrix) 04/20/2017, 06/29/2017   Zoster, Live 03/02/2010    TDAP status: Up to date  Flu Vaccine status: Up to date  Pneumococcal vaccine status: Up to date  Covid-19 vaccine status: Completed vaccines  Qualifies for Shingles Vaccine? Yes   Zostavax completed Yes   Shingrix Completed?: Yes  Screening Tests Health Maintenance  Topic Date Due   COVID-19 Vaccine (5 - Booster for Pfizer series) 12/05/2020   TETANUS/TDAP  01/13/2029   INFLUENZA VACCINE  Completed   DEXA SCAN  Completed   Zoster Vaccines- Shingrix  Completed   HPV VACCINES  Aged Out    Health Maintenance  Health Maintenance Due  Topic Date Due   COVID-19 Vaccine (5 - Booster for [place] series) 12/05/2020    Colorectal cancer screening: No  longer required.   Mammogram status: No longer required due to age.     Additional Screening:   Vision Screening: Recommended annual ophthalmology exams for early detection of glaucoma and other disorders of the eye. Is the patient up to date with their annual eye exam?  Yes  Who is the provider or what is the name of the office in which the patient attends annual eye exams? Dr Katy Fitch  If pt is not established with a provider, would they like to be referred to a provider to establish care? No .   Dental Screening: Recommended annual dental exams for proper oral hygiene  Community Resource Referral / Chronic Care Management: CRR required this visit?  No   CCM required this visit?  No      Plan:     I have personally reviewed and noted  the following in the patient's chart:   Medical and social history Use of alcohol, tobacco or illicit drugs  Current medications and supplements including opioid prescriptions.  Functional ability and status Nutritional status Physical activity Advanced directives List of other physicians Hospitalizations, surgeries, and ER visits in previous 12 months Vitals Screenings to include cognitive, depression, and falls Referrals and appointments  In addition, I have reviewed and discussed with patient certain preventive protocols, quality metrics, and best practice recommendations. A written personalized care plan for preventive services as well as general preventive health recommendations were provided to patient.     Willette Brace, LPN   10/29/8673   Nurse Notes: none

## 2021-02-05 DIAGNOSIS — Z5181 Encounter for therapeutic drug level monitoring: Secondary | ICD-10-CM | POA: Diagnosis not present

## 2021-02-05 DIAGNOSIS — I10 Essential (primary) hypertension: Secondary | ICD-10-CM | POA: Diagnosis not present

## 2021-02-06 LAB — BASIC METABOLIC PANEL
BUN/Creatinine Ratio: 26 (ref 12–28)
BUN: 28 mg/dL (ref 10–36)
CO2: 21 mmol/L (ref 20–29)
Calcium: 9.3 mg/dL (ref 8.7–10.3)
Chloride: 103 mmol/L (ref 96–106)
Creatinine, Ser: 1.06 mg/dL — ABNORMAL HIGH (ref 0.57–1.00)
Glucose: 85 mg/dL (ref 70–99)
Potassium: 4.1 mmol/L (ref 3.5–5.2)
Sodium: 140 mmol/L (ref 134–144)
eGFR: 49 mL/min/{1.73_m2} — ABNORMAL LOW (ref >59)

## 2021-02-08 ENCOUNTER — Other Ambulatory Visit: Payer: Self-pay | Admitting: Physical Medicine & Rehabilitation

## 2021-02-15 ENCOUNTER — Other Ambulatory Visit: Payer: Self-pay | Admitting: Cardiovascular Disease

## 2021-02-17 NOTE — Progress Notes (Signed)
Phone [phone] In person visit   Subjective:   [name] [name] is a 87 y.o. year old very pleasant female patient who presents for/with See problem oriented charting Chief Complaint  Patient presents with   Follow-up    6 month f/u HTN/Hyperglycemia.   Average BP at home 125/80 - 135/88.       This visit occurred during the SARS-CoV-2 public health emergency.  Safety protocols were in place, including screening questions prior to the visit, additional usage of staff PPE, and extensive cleaning of exam room while observing appropriate contact time as indicated for disinfecting solutions.   Past Medical History-  Patient Active Problem List   Diagnosis Date Noted   Chronic diastolic heart failure ([place]) 12/30/2020    Priority: High   Pain in right ankle and joints of right foot 08/28/2014    Priority: High   Peripheral arterial disease ([place]) 06/29/2007    Priority: High   Left groin hernia 05/22/2020    Priority: Medium    CKD (chronic kidney disease), stage III ([place]) 08/07/2017    Priority: Medium    Acute gout of left foot 03/22/2016    Priority: Medium    Trigeminal neuralgia     Priority: Medium    Chronic low back pain 09/13/2010    Priority: Medium    COPD mixed type ([place]) 01/22/2010    Priority: Medium    Insulin resistance 01/06/2009    Priority: Medium    Hyperlipidemia 10/23/2006    Priority: Medium    Essential hypertension 10/23/2006    Priority: Medium    ESOPHAGEAL STRICTURE 08/09/1993    Priority: Medium    Secondary hypercoagulable state ([place]) 12/03/2019    Priority: Low   Obesity (BMI 30.0-34.9) 11/14/2017    Priority: Low   Unilateral primary osteoarthritis, right knee 04/07/2016    Priority: Low   Encounter for therapeutic drug monitoring 10/21/2013    Priority: Low   Syncope 12/29/2011    Priority: Low   Allergic rhinitis due to pollen 06/25/2010    Priority: Low   Solitary pulmonary nodule 01/12/2010    Priority: Low   Osteopenia  01/06/2009    Priority: Low   Eczema 11/26/2007    Priority: Low   Primary osteoarthritis of right knee 06/02/2020   Sleep disturbance 01/09/2020   Acute lower UTI    Anxiety state    Peripheral edema    Leukocytosis    Debility 12/13/2019   Dyslipidemia    Rectal bleeding    Acute blood loss anemia    Ischemic colitis ([place])    Status post femoral-popliteal bypass surgery 12/09/2019   Persistent atrial fibrillation ([place]) 12/03/2019   Chronic bilateral low back pain with right-sided sciatica 08/29/2019   Chronic pain of right knee 08/29/2019   Abnormality of gait 05/30/2019   Neuropathic pain 05/30/2019   Myalgia 08/08/2018   Chronic pain syndrome 06/27/2018   Gross hematuria 06/01/2018   Failed back syndrome 09/28/2017    Medications- reviewed and updated Current Outpatient Medications  Medication Sig Dispense Refill   acetaminophen (TYLENOL) 325 MG tablet Take 1-2 tablets (325-650 mg total) by mouth every 4 (four) hours as needed for mild pain (or temp >/= 101 F).     apixaban (ELIQUIS) 5 MG TABS tablet Take 1 tablet by mouth twice daily 180 tablet 1   atorvastatin (LIPITOR) 40 MG tablet Take 1 tablet (40 mg total) by mouth daily. 90 tablet 3   baclofen (LIORESAL) 10 MG tablet  TAKE 1/2 (ONE-HALF) TABLET BY MOUTH NIGHTLY 30 tablet 0   furosemide (LASIX) 40 MG tablet Take 40 mg by mouth as directed. TAKE 1/2 TABLET DAILY     ipratropium (ATROVENT) 0.06 % nasal spray Place 2 sprays into the nose 2 (two) times daily. 15 mL 11   lisinopril (ZESTRIL) 5 MG tablet Take 1 tablet by mouth once daily 90 tablet 3   metoprolol succinate (TOPROL-XL) 25 MG 24 hr tablet Take 3 tablets by mouth once daily 270 tablet 0   Multiple Vitamin (MULTIVITAMIN WITH MINERALS) TABS tablet Take 1 tablet by mouth daily.     pantoprazole (PROTONIX) 40 MG tablet Take one tablet twice daily as directed 60 tablet 11   RESTASIS 0.05 % ophthalmic emulsion Place 1 drop into both eyes 2 (two) times daily as needed  (dry eyes).   99   No current facility-administered medications for this visit.     Objective:  BP 130/76   Pulse 89   Temp 97.8 F (36.6 C) (Temporal)   Ht 4\' 11"  (1.499 m)   Wt 163 lb 6.4 oz (74.1 kg)   SpO2 96%   BMI 33.00 kg/m  Gen: NAD, resting comfortably CV: RRR no murmurs rubs or gallops Lungs: CTAB no crackles, wheeze, rhonchi Abdomen: soft/nontender/nondistended/normal bowel sounds. No rebound or guarding.  Ext: trace edema Skin: warm, dry Neuro: walking with cane    Assessment and Plan   #HM- advised omicron booster for patient at [place]  #left groin hernia- seen in January for this- had some slight pain with weight lifting- we had discussed could continue unless worsened pain. Emergent precautions given- she preferred to avoid surgery.  -Thankfully She stated no recent issues with that. Avoids heavy lifting   #PAD-actually on warfarin for arterial thrombosis in the past now on Eliquis for A. fib as well. Dr. Oneida Alar ok with compression stockings #Hyperlipidemia  S: Compliant with simvastatin at 40 mg.  LDL goal 70 or less due to arterial disease. No claudication Lab Results  Component Value Date   CHOL 134 01/08/2021   HDL 55 01/08/2021   LDLCALC 56 01/08/2021   LDLDIRECT 46.0 06/10/2019   TRIG 130 01/08/2021   CHOLHDL 2.4 01/08/2021   A/P: pad asymptomatic. Lipids well controlled- continue current meds- on eliquis and not on aspirin as a result  # Atrial fibrillation S: Rate controlled with metoprolol 75 mg extended release Anticoagulated with Eliquis 5 mg twice daily A/P: Appropriately anticoagulated and rate controlled-continue current medication    #chronic diastolic heart failure #Essential hypertension S: medication: Lisinopril 5 mg, metoprolol 75 mg XR and lasix 20 mg daily at this point -prior compliant with irbesartan 30 mg and hydrochlorothiazide 25 mg but stopped after august 2021 hospitalization.  BP Readings from Last 3 Encounters:   02/24/21 130/76  02/23/21 138/86  01/21/21 100/64  A/P: blood pressure well controlled- continue current meds   For heart failure- appears euvolemic- weight only up slightly with reducing lasix from 40mg  to 20 mg (had to be reduced due to worsening kidney function)   #Chronic kidney disease stage III S: GFR is typically in the 40s range- 49 on recent check A/P: stable on recent check- continue to monitor  #Insulin resistance/obesity/hyperglycemia-opted to wait until next visit to check a1c Lab Results  Component Value Date   HGBA1C 6.1 08/20/2020   Other notes: 1.  Dr. Neldon Mc started her on protonix 40mg - ahe saw her yesterday- monitoring.  2. Has follow up soon for trigeminal neuralgia  Dr. Tomi Likens off of lamictal now just on baclofen as needed 3. for back pain-discouraged by level of pain- injections and pain meds in past not as helpful as she would like  #COPD- largely asymptomatic- continued to monitor without meds   Recommended follow up: Return in about 6 months (around 08/24/2021) for follow up- or sooner if needed. Future Appointments  Date Time Provider [place]  03/03/2021  9:50 AM Pieter Partridge, DO [facility] None  03/11/2021 10:40 AM Raulkar, Clide Deutscher, MD [facility] CPR  03/31/2021 10:20 AM Skeet Latch, MD [facility]  02/03/2022  1:00 PM [facility] HEALTH COACH [facility] PEC   Lab/Order associations:   ICD-10-CM   1. Hyperlipidemia, unspecified hyperlipidemia type  E78.5     2. Essential hypertension  I10     3. Peripheral arterial disease (HCC)  I73.9     4. Persistent atrial fibrillation (HCC)  I48.19     5. Stage 3 chronic kidney disease, unspecified whether stage 3a or 3b CKD (HCC)  N18.30     6. Hyperglycemia  R73.9     7. Chronic diastolic heart failure (HCC)  I50.32       No orders of the defined types were placed in this encounter.   I,Jada Bradford,acting as a scribe for [name], MD.,have documented all relevant documentation on  the behalf of [name], MD,as directed by  [name], MD while in the presence of [name], MD.  I, [name], MD, have reviewed all documentation for this visit. The documentation on 02/24/21 for the exam, diagnosis, procedures, and orders are all accurate and complete.  Return precautions advised.  [name], MD

## 2021-02-23 ENCOUNTER — Ambulatory Visit (INDEPENDENT_AMBULATORY_CARE_PROVIDER_SITE_OTHER): Payer: Medicare Other | Admitting: Allergy and Immunology

## 2021-02-23 ENCOUNTER — Other Ambulatory Visit: Payer: Self-pay

## 2021-02-23 VITALS — BP 138/86 | HR 98 | Temp 98.1°F | Resp 20 | Ht 59.0 in | Wt 163.8 lb

## 2021-02-23 DIAGNOSIS — H04123 Dry eye syndrome of bilateral lacrimal glands: Secondary | ICD-10-CM

## 2021-02-23 DIAGNOSIS — K219 Gastro-esophageal reflux disease without esophagitis: Secondary | ICD-10-CM

## 2021-02-23 DIAGNOSIS — J3089 Other allergic rhinitis: Secondary | ICD-10-CM

## 2021-02-23 MED ORDER — IPRATROPIUM BROMIDE 0.06 % NA SOLN: 2.0000 | Freq: Two times a day (BID) | NASAL | 11 refills | Status: DC

## 2021-02-23 MED ORDER — PANTOPRAZOLE SODIUM 40 MG PO TBEC: DELAYED_RELEASE_TABLET | ORAL | 11 refills | Status: DC

## 2021-02-23 NOTE — Progress Notes (Signed)
[place]   Follow-up Note  Referring Provider: Marin Olp, MD Primary Provider: Marin Olp, MD Date of Office Visit: 02/23/2021  Subjective:   [name] [name] (DOB: [birth year]) is a 87 y.o. female who returns to the [facility] on 02/23/2021 in re-evaluation of the following:  HPI: Kimarie returns to this clinic in reevaluation of LPR and chronic rhinitis and history of dry eye syndrome.  I last saw her in this clinic on 18 February 2020.  She has really done well since her last visit and has had her LPR under excellent control while using pantoprazole twice a day and she has had no classic reflux symptoms.  She does have rhinorrhea which she treats with nasal ipratropium successfully.  She uses some occasional lubricating eyedrops.  She has received 3 [place] vaccinations and has received this year's flu vaccine.  Allergies as of 02/23/2021       Reactions   Sulfa Antibiotics Other (See Comments)   Cold sweat light headed and disorientation   Tiotropium Bromide Shortness Of Breath, Other (See Comments)   Sore throat also   Latex Itching, Rash   Ultram [tramadol] Nausea And Vomiting   5-alpha Reductase Inhibitors         Medication List    acetaminophen 325 MG tablet Commonly known as: TYLENOL Take 1-2 tablets (325-650 mg total) by mouth every 4 (four) hours as needed for mild pain (or temp >/= 101 F).   atorvastatin 40 MG tablet Commonly known as: LIPITOR Take 1 tablet (40 mg total) by mouth daily.   baclofen 10 MG tablet Commonly known as: LIORESAL TAKE 1/2 (ONE-HALF) TABLET BY MOUTH NIGHTLY   Eliquis 5 MG Tabs tablet Generic drug: apixaban Take 1 tablet by mouth twice daily   furosemide 40 MG tablet Commonly known as: LASIX Take 40 mg by mouth as directed. TAKE 1/2 TABLET DAILY   ipratropium 0.06 % nasal spray Commonly known as: ATROVENT USE 2 SPRAY(S) IN EACH NOSTRIL  THREE TIMES DAILY   lisinopril 5 MG tablet Commonly known as: ZESTRIL Take 1 tablet by mouth once daily   metoprolol succinate 25 MG 24 hr tablet Commonly known as: TOPROL-XL Take 3 tablets by mouth once daily   multivitamin with minerals Tabs tablet Take 1 tablet by mouth daily.   pantoprazole 40 MG tablet Commonly known as: PROTONIX Take one tablet twice daily as directed   Restasis 0.05 % ophthalmic emulsion Generic drug: cycloSPORINE Place 1 drop into both eyes 2 (two) times daily as needed (dry eyes).    Past Medical History:  Diagnosis Date   Acute on chronic diastolic heart failure ([place]) 12/30/2020   Allergic rhinoconjunctivitis    Allergy    Anxiety    pt. managed- uses deep breathing    Arthritis    low back , stenosis   COLONIC POLYPS, RECURRENT 08/29/2006   2008 last colonoscopy. No further colonoscopy.      COPD (chronic obstructive pulmonary disease) (HCC)    Diverticulosis    Dysrhythmia    afib   Eczema    Environmental allergies    allergy shot- q friday in Dr. Janee Morn office. PFT's abnormal- recommended Spiriva to use preop & will d/c after surgery   GERD (gastroesophageal reflux disease)    Hiatal hernia    Hyperlipidemia    Hypertension    Lung nodule 2011   Paroxysmal atrial fibrillation ([place]) 11/27/2019  Peripheral vascular disease ([place])    Shingles    Stenosis of popliteal artery (HCC)    blood clots in legs long ago     Trigeminal neuralgia    Left buttocks    Past Surgical History:  Procedure Laterality Date   ABDOMINAL AORTAGRAM N/A 06/17/2011   Procedure: ABDOMINAL Maxcine Ham;  Surgeon: Elam Dutch, MD;  Location: [facility] CATH LAB;  Service: Cardiovascular;  Laterality: N/A;   ABDOMINAL AORTOGRAM W/LOWER EXTREMITY Bilateral 06/22/2018   Procedure: ABDOMINAL AORTOGRAM W/LOWER EXTREMITY;  Surgeon: Elam Dutch, MD;  Location: [facility] CV LAB;  Service: Cardiovascular;  Laterality: Bilateral;   ABDOMINAL AORTOGRAM W/LOWER EXTREMITY  Bilateral 11/29/2019   Procedure: ABDOMINAL AORTOGRAM W/LOWER EXTREMITY;  Surgeon: Elam Dutch, MD;  Location: [facility] CV LAB;  Service: Cardiovascular;  Laterality: Bilateral;   CATARACT EXTRACTION, BILATERAL     w IOL   CHOLECYSTECTOMY  1998   COLONOSCOPY     ENDARTERECTOMY FEMORAL Left 12/09/2019   Procedure: ENDARTERECTOMY FEMORAL with bovine patch angioplasty.;  Surgeon: Elam Dutch, MD;  Location: [facility];  Service: Vascular;  Laterality: Left;   FEMORAL-POPLITEAL BYPASS GRAFT        x2 surgeries 1990's & 2009   FEMORAL-POPLITEAL BYPASS GRAFT Right 12/09/2019   Procedure: REDO RIGHT FEMORAL-POPLITEAL ARTERY BYPASS GRAFT;  Surgeon: Elam Dutch, MD;  Location: [place] [facility];  Service: Vascular;  Laterality: Right;   INJECTION KNEE Right Aug. 2016   Gel injection for pain   LUMBAR FUSION  07/06/2011   TONSILLECTOMY     as a teenager    TUBAL LIGATION      Review of systems negative except as noted in HPI / PMHx or noted below:  Review of Systems  Constitutional: Negative.   HENT: Negative.    Eyes: Negative.   Respiratory: Negative.    Cardiovascular: Negative.   Gastrointestinal: Negative.   Genitourinary: Negative.   Musculoskeletal: Negative.   Skin: Negative.   Neurological: Negative.   Endo/Heme/Allergies: Negative.   Psychiatric/Behavioral: Negative.      Objective:   Vitals:   02/23/21 1136  BP: 138/86  Pulse: 98  Resp: 20  Temp: 98.1 F (36.7 C)  SpO2: 93%   Height: 4\' 11"  (149.9 cm)  Weight: 163 lb 12.8 oz (74.3 kg)   Physical Exam Constitutional:      Appearance: She is not diaphoretic.  HENT:     Head: Normocephalic.     Right Ear: Tympanic membrane, ear canal and external ear normal.     Left Ear: Tympanic membrane, ear canal and external ear normal.     Nose: Nose normal. No mucosal edema or rhinorrhea.     Mouth/Throat:     Pharynx: Uvula midline. No oropharyngeal exudate.  Eyes:     Conjunctiva/sclera: Conjunctivae normal.  Neck:      Thyroid: No thyromegaly.     Trachea: Trachea normal. No tracheal tenderness or tracheal deviation.  Cardiovascular:     Rate and Rhythm: Normal rate and regular rhythm.     Heart sounds: Normal heart sounds, S1 normal and S2 normal. No murmur heard. Pulmonary:     Effort: No respiratory distress.     Breath sounds: Normal breath sounds. No stridor. No wheezing or rales.  Lymphadenopathy:     Head:     Right side of head: No tonsillar adenopathy.     Left side of head: No tonsillar adenopathy.     Cervical: No cervical adenopathy.  Skin:  Findings: No erythema or rash.     Nails: There is no clubbing.  Neurological:     Mental Status: She is alert.    Diagnostics: none  Assessment and Plan:   1. LPRD (laryngopharyngeal reflux disease)   2. Other allergic rhinitis   3. Dry eye syndrome of both eyes     1.  Continue to Treat LPR:   A.  Pantoprazole 40mg  - 1 tablet 2 times per day  2. If needed:   A. Ipratropium 0.06% - 2 sprays each nostril 2 times per day  3. Return to clinic in 12 months or earlier if problem.    Sharonne appears to be doing very well regarding her current plan of therapy directed against LPR and rhinitis.  I have informed her that there is no need for her to follow-up in this clinic on a regular basis if her primary care doctor will refill her medications.  I will be very happy to see her back in this clinic should she develop problems in the face of the plan noted above.  Allena Katz, MD [facility] / [facility] [place]

## 2021-02-23 NOTE — Patient Instructions (Addendum)
  1.  Continue to Treat LPR:   A.  Pantoprazole 40mg  - 1 tablet 2 times per day  2. If needed:   A. Ipratropium 0.06% - 2 sprays each nostril 2 times per day  3. Return to clinic in 12 months or earlier if problem.

## 2021-02-24 ENCOUNTER — Encounter: Payer: Self-pay | Admitting: Family Medicine

## 2021-02-24 ENCOUNTER — Encounter: Payer: Self-pay | Admitting: Allergy and Immunology

## 2021-02-24 ENCOUNTER — Ambulatory Visit (INDEPENDENT_AMBULATORY_CARE_PROVIDER_SITE_OTHER): Payer: Medicare Other | Admitting: Family Medicine

## 2021-02-24 VITALS — BP 130/76 | HR 89 | Temp 97.8°F | Ht 59.0 in | Wt 163.4 lb

## 2021-02-24 DIAGNOSIS — R739 Hyperglycemia, unspecified: Secondary | ICD-10-CM | POA: Diagnosis not present

## 2021-02-24 DIAGNOSIS — I4819 Other persistent atrial fibrillation: Secondary | ICD-10-CM | POA: Diagnosis not present

## 2021-02-24 DIAGNOSIS — I10 Essential (primary) hypertension: Secondary | ICD-10-CM

## 2021-02-24 DIAGNOSIS — N183 Chronic kidney disease, stage 3 unspecified: Secondary | ICD-10-CM | POA: Diagnosis not present

## 2021-02-24 DIAGNOSIS — I5032 Chronic diastolic (congestive) heart failure: Secondary | ICD-10-CM

## 2021-02-24 DIAGNOSIS — E785 Hyperlipidemia, unspecified: Secondary | ICD-10-CM | POA: Diagnosis not present

## 2021-02-24 DIAGNOSIS — I739 Peripheral vascular disease, unspecified: Secondary | ICD-10-CM | POA: Diagnosis not present

## 2021-02-24 NOTE — Patient Instructions (Addendum)
Health Maintenance Due  Topic Date Due   COVID-19 Vaccine - recommend bivalent booster at the pharmacy 09/30/2020   Holding off on labs for now- will do next visit  Recommended follow up: Return in about 6 months (around 08/24/2021) for follow up- or sooner if needed.

## 2021-03-02 NOTE — Progress Notes (Signed)
NEUROLOGY FOLLOW UP OFFICE NOTE  [name] [name] [id number]  Assessment/Plan:   Left sided trigeminal neuralgia - currently with new flare.  She would like to avoid restarting a new daily preventative, so will try to break current flare with a prednisone taper  Prednisone taper She is starting acupuncture tomorrow  If above treatment ineffective, she will contact me and we can start nortriptyline.   Follow up 6 months or as needed.  Subjective:  [name] [name] is a 87 year old female with COPD, CKD, PAD and history of DVTs and PAF on chronic Coumadin who follows up for left-sided trigeminal neuralgia.  She is accompanied by her daughter who supplements history. Marland Kitchen   UPDATE: Current medication:  none   She was tapered off of lamotrigine in May.  She was doing well until 5 days ago.  She is with a flare up now. She thinks that increased anxiety which has caused increased jaw clenching, is the trigger.     HISTORY: She was diagnosed with right-sided trigeminal neuralgia in 2016.  She was treated at that time. She was treated with carbamazepine which was stopped due to interference with Coumadin and later gabapentin.  It subsequently resolved.   In 2020, she began having left sided facial pain.  This is worse than the previous episode.  It mostly involves the cheek.  It is sometimes a paroxysmal stabbing pain or a constant sharp electric pain.  It is daily.  It gets worse later in the day.  Movement aggravates it.  She cannot sleep, talk on the phone or eat.  Staying still helps ease it.  No numbness or tingling.  No facial weakness.  No associated rash.  No change in hearing or vision.    Past medications:  Gabapentin (unable to tolerate), carbamazepine (interfered with Coumadin), oxcarbazepine (drowsiness); Cymbalta (not used for this), tramadol (caused nausea); baclofen (for back pain), lamotrigine 100mg  BID (helpful but makes her feel "drunk")  PAST MEDICAL HISTORY: Past Medical  History:  Diagnosis Date   Acute on chronic diastolic heart failure ([place]) 12/30/2020   Allergic rhinoconjunctivitis    Allergy    Anxiety    pt. managed- uses deep breathing    Arthritis    low back , stenosis   COLONIC POLYPS, RECURRENT 08/29/2006   2008 last colonoscopy. No further colonoscopy.      COPD (chronic obstructive pulmonary disease) (HCC)    Diverticulosis    Dysrhythmia    afib   Eczema    Environmental allergies    allergy shot- q friday in Dr. Janee Morn office. PFT's abnormal- recommended Spiriva to use preop & will d/c after surgery   GERD (gastroesophageal reflux disease)    Hiatal hernia    Hyperlipidemia    Hypertension    Lung nodule 2011   Paroxysmal atrial fibrillation ([place]) 11/27/2019   Peripheral vascular disease (HCC)    Shingles    Stenosis of popliteal artery (HCC)    blood clots in legs long ago     Trigeminal neuralgia    Left buttocks    MEDICATIONS: Current Outpatient Medications on File Prior to Visit  Medication Sig Dispense Refill   acetaminophen (TYLENOL) 325 MG tablet Take 1-2 tablets (325-650 mg total) by mouth every 4 (four) hours as needed for mild pain (or temp >/= 101 F).     apixaban (ELIQUIS) 5 MG TABS tablet Take 1 tablet by mouth twice daily 180 tablet 1   atorvastatin (LIPITOR) 40 MG tablet Take  1 tablet (40 mg total) by mouth daily. 90 tablet 3   baclofen (LIORESAL) 10 MG tablet TAKE 1/2 (ONE-HALF) TABLET BY MOUTH NIGHTLY 30 tablet 0   furosemide (LASIX) 40 MG tablet Take 40 mg by mouth as directed. TAKE 1/2 TABLET DAILY     ipratropium (ATROVENT) 0.06 % nasal spray Place 2 sprays into the nose 2 (two) times daily. 15 mL 11   lisinopril (ZESTRIL) 5 MG tablet Take 1 tablet by mouth once daily 90 tablet 3   metoprolol succinate (TOPROL-XL) 25 MG 24 hr tablet Take 3 tablets by mouth once daily 270 tablet 0   Multiple Vitamin (MULTIVITAMIN WITH MINERALS) TABS tablet Take 1 tablet by mouth daily.     pantoprazole (PROTONIX) 40 MG tablet  Take one tablet twice daily as directed 60 tablet 11   RESTASIS 0.05 % ophthalmic emulsion Place 1 drop into both eyes 2 (two) times daily as needed (dry eyes).   99   No current facility-administered medications on file prior to visit.    ALLERGIES: Allergies  Allergen Reactions   Sulfa Antibiotics Other (See Comments)    Cold sweat light headed and disorientation   Tiotropium Bromide Shortness Of Breath and Other (See Comments)    Sore throat also   Latex Itching and Rash   Ultram [Tramadol] Nausea And Vomiting   5-Alpha Reductase Inhibitors     FAMILY HISTORY: Family History  Problem Relation Age of Onset   Arthritis Mother    Hypertension Mother    Heart disease Father    Colon polyps Father    Breast cancer Sister    Breast cancer Daughter    Hypertension Son    Lung cancer Brother    Colon cancer Sister    Brain cancer Sister    Lung cancer Sister    Anesthesia problems Neg Hx    Hypotension Neg Hx    Malignant hyperthermia Neg Hx    Pseudochol deficiency Neg Hx       Objective:  Blood pressure 138/83, pulse 96, height 4\' 11"  (1.499 m), weight 162 lb 9.6 oz (73.8 kg), SpO2 94 %. General: No acute distress.  Patient appears well-groomed.      Metta Clines, DO  CC: [name] Reddish, MD

## 2021-03-03 ENCOUNTER — Other Ambulatory Visit: Payer: Self-pay

## 2021-03-03 ENCOUNTER — Encounter: Payer: Self-pay | Admitting: Neurology

## 2021-03-03 ENCOUNTER — Ambulatory Visit (INDEPENDENT_AMBULATORY_CARE_PROVIDER_SITE_OTHER): Payer: Medicare Other | Admitting: Neurology

## 2021-03-03 VITALS — BP 138/83 | HR 96 | Ht 59.0 in | Wt 162.6 lb

## 2021-03-03 DIAGNOSIS — G500 Trigeminal neuralgia: Secondary | ICD-10-CM

## 2021-03-03 MED ORDER — PREDNISONE 10 MG PO TABS: ORAL_TABLET | ORAL | 0 refills | Status: DC

## 2021-03-03 NOTE — Patient Instructions (Signed)
Will try to break current flare with a prednisone taper:  Take 6tabs x1day, then 5tabs x1day, then 4tabs x1day, then 3tabs x1day, then 2tabs x1day, then 1tab x1day, then STOP Try acupuncture 3.   If flare persists, let me know and we can start nortriptyline  4    Follow up 6 months or as needed.

## 2021-03-11 ENCOUNTER — Encounter: Payer: Self-pay | Admitting: Physical Medicine and Rehabilitation

## 2021-03-11 ENCOUNTER — Other Ambulatory Visit: Payer: Self-pay

## 2021-03-11 ENCOUNTER — Encounter
Payer: Medicare Other | Attending: Physical Medicine and Rehabilitation | Admitting: Physical Medicine and Rehabilitation

## 2021-03-11 VITALS — BP 134/81 | HR 90 | Temp 98.1°F | Ht 59.0 in | Wt 164.0 lb

## 2021-03-11 DIAGNOSIS — M5441 Lumbago with sciatica, right side: Secondary | ICD-10-CM | POA: Diagnosis not present

## 2021-03-11 DIAGNOSIS — G8929 Other chronic pain: Secondary | ICD-10-CM | POA: Diagnosis not present

## 2021-03-11 DIAGNOSIS — M791 Myalgia, unspecified site: Secondary | ICD-10-CM | POA: Diagnosis not present

## 2021-03-11 NOTE — Progress Notes (Signed)
Trigger Point Injection  Indication: Lumbar myofascial pain not relieved by medication management and other conservative care.  Informed consent was obtained after describing risk and benefits of the procedure with the patient, this includes bleeding, bruising, infection and medication side effects.  The patient wishes to proceed and has given written consent.  The patient was placed in a seated position.  The area of pain was marked and prepped with Betadine.  It was entered with a 25-gauge 1/2 inch needle and a total of 5 mL of 1% lidocaine and normal saline was injected into a total of 6 trigger points, after negative draw back for blood.  The patient tolerated the procedure well.  Post procedure instructions were given.

## 2021-03-29 DIAGNOSIS — Z23 Encounter for immunization: Secondary | ICD-10-CM | POA: Diagnosis not present

## 2021-03-30 NOTE — Progress Notes (Incomplete)
Cardiology Office Note   Date:  03/30/2021   ID:  [name] [name], DOB 1925-01-25, MRN [id number]  PCP:  Marin Olp, MD  Cardiologist:   Orion Crook   No chief complaint on file.    History of Present Illness: [name] [name] is a 87 y.o. female with hypertension, hyperlipidemia, COPD, and PAD status post right femoral to above-knee popliteal bypass (2006) here fore follow up.  She has a known stenosis of her peripheral bypass based on an arteriogram 05/2018 which revealed 80% narrowing of the mid right femoral-popliteal bypass graft.  She also had 70% stenosis of her left common femoral..  She previously deferred intervention because of concern about having an operation at her age.  However she has had increasing symptoms of claudication and last saw Dr. Oneida Alar on 7/29.  She previously underwent revision of her femoral to above-knee bypass in 2010.  This was extended to below the knee popliteal artery.  She is on chronic warfarin therapy since her thrombolysis procedure several years ago.  She is scheduled to undergo aortogram with lower extremity runoff on 11/29/2019.  She was referred to cardiology for presurgical risk assessment prior to redo of her right leg bypass on 8/16.  [name] had a stress echo 12/2011 that revealed normal systolic function and no ischemia.  [name] was seen in 2021 for preoperative risk assessment prior to redo femoral-popliteal bypass grafting.  At that appointment she was noted to be in atrial fibrillation, which was a new finding.  She was started on metoprolol for rate control and was already on warfarin.  After her surgery she developed rectal bleeding due to acute colitis.  That hospitalization was also complicated by atrial fibrillation with RVR.  She followed up with [name], NP on 02/2020 and was doing well.  Prior to that appointment her furosemide was increased due to volume overload.  Echo 11/2019 revealed LVEF 60 to 65% with mild left  atrial enlargement and no other significant abnormalities.  She had ABIs 04/2020 which were 0.97 on the right and 1.29 on the left.  Arterial Dopplers revealed patent bypass grafts without stenosis. She has no claudication.    At her last appointment her blood pressure was elevated in the office but she thought it had been well-controlled at home.  She wanted to track and bring to follow-up.  Since then lisinopril was added to her regimen. Overall she is feeling well.  She is sometimes short of breath but only on humid days.  She has struggled with some LE edema but hasn't been wearing her compression socks.  Her edema improves with elevation.  She has been taking lasix almost daily.  Her weight has been up.  Her BP at home has been 120s-140s/70s-80s.     Past Medical History:  Diagnosis Date   Acute on chronic diastolic heart failure ([place]) 12/30/2020   Allergic rhinoconjunctivitis    Allergy    Anxiety    pt. managed- uses deep breathing    Arthritis    low back , stenosis   COLONIC POLYPS, RECURRENT 08/29/2006   2008 last colonoscopy. No further colonoscopy.      COPD (chronic obstructive pulmonary disease) (HCC)    Diverticulosis    Dysrhythmia    afib   Eczema    Environmental allergies    allergy shot- q friday in Dr. Janee Morn office. PFT's abnormal- recommended Spiriva to use preop & will d/c after surgery   GERD (gastroesophageal reflux  disease)    Hiatal hernia    Hyperlipidemia    Hypertension    Lung nodule 2011   Paroxysmal atrial fibrillation ([place]) 11/27/2019   Peripheral vascular disease (HCC)    Shingles    Stenosis of popliteal artery (HCC)    blood clots in legs long ago     Trigeminal neuralgia    Left buttocks    Past Surgical History:  Procedure Laterality Date   ABDOMINAL AORTAGRAM N/A 06/17/2011   Procedure: ABDOMINAL Maxcine Ham;  Surgeon: Elam Dutch, MD;  Location: [facility] C[facility] LAB;  Service: Cardiovascular;  Laterality: N/A;   ABDOMINAL AORTOGRAM W/LOWER  EXTREMITY Bilateral 06/22/2018   Procedure: ABDOMINAL AORTOGRAM W/LOWER EXTREMITY;  Surgeon: Elam Dutch, MD;  Location: [place] CV LAB;  Service: Cardiovascular;  Laterality: Bilateral;   ABDOMINAL AORTOGRAM W/LOWER EXTREMITY Bilateral 11/29/2019   Procedure: ABDOMINAL AORTOGRAM W/LOWER EXTREMITY;  Surgeon: Elam Dutch, MD;  Location: [place] CV LAB;  Service: Cardiovascular;  Laterality: Bilateral;   CATARACT EXTRACTION, BILATERAL     w IOL   CHOLECYSTECTOMY  1998   COLONOSCOPY     ENDARTERECTOMY FEMORAL Left 12/09/2019   Procedure: ENDARTERECTOMY FEMORAL with bovine patch angioplasty.;  Surgeon: Elam Dutch, MD;  Location: [place];  Service: Vascular;  Laterality: Left;   FEMORAL-POPLITEAL BYPASS GRAFT        x2 surgeries 1990's & 2009   FEMORAL-POPLITEAL BYPASS GRAFT Right 12/09/2019   Procedure: REDO RIGHT FEMORAL-POPLITEAL ARTERY BYPASS GRAFT;  Surgeon: Elam Dutch, MD;  Location: [place];  Service: Vascular;  Laterality: Right;   INJECTION KNEE Right Aug. 2016   Gel injection for pain   LUMBAR FUSION  07/06/2011   TONSILLECTOMY     as a teenager    TUBAL LIGATION       Current Outpatient Medications  Medication Sig Dispense Refill   acetaminophen (TYLENOL) 325 MG tablet Take 1-2 tablets (325-650 mg total) by mouth every 4 (four) hours as needed for mild pain (or temp >/= 101 F).     apixaban (ELIQUIS) 5 MG TABS tablet Take 1 tablet by mouth twice daily 180 tablet 1   atorvastatin (LIPITOR) 40 MG tablet Take 1 tablet (40 mg total) by mouth daily. 90 tablet 3   baclofen (LIORESAL) 10 MG tablet TAKE 1/2 (ONE-HALF) TABLET BY MOUTH NIGHTLY 30 tablet 0   furosemide (LASIX) 40 MG tablet Take 40 mg by mouth as directed. TAKE 1/2 TABLET DAILY     ipratropium (ATROVENT) 0.06 % nasal spray Place 2 sprays into the nose 2 (two) times daily. 15 mL 11   lisinopril (ZESTRIL) 5 MG tablet Take 1 tablet by mouth once daily 90 tablet 3   metoprolol succinate (TOPROL-XL) 25 MG  24 hr tablet Take 3 tablets by mouth once daily 270 tablet 0   Multiple Vitamin (MULTIVITAMIN WITH MINERALS) TABS tablet Take 1 tablet by mouth daily.     pantoprazole (PROTONIX) 40 MG tablet Take one tablet twice daily as directed 60 tablet 11   RESTASIS 0.05 % ophthalmic emulsion Place 1 drop into both eyes 2 (two) times daily as needed (dry eyes).   99   No current facility-administered medications for this visit.    Allergies:   Sulfa antibiotics, Tiotropium bromide, Latex, Ultram [tramadol], and 5-alpha reductase inhibitors   Social History:  The patient  reports that she quit smoking about 27 years ago. Her smoking use included cigarettes. She has a 60.00 pack-year smoking history. She has never used  smokeless tobacco. She reports that she does not drink alcohol and does not use drugs.   Family History:  The patient's family history includes Arthritis in her mother; Brain cancer in her sister; Breast cancer in her daughter and sister; Colon cancer in her sister; Colon polyps in her father; Heart disease in her father; Hypertension in her mother and son; Lung cancer in her brother and sister.    ROS:  Please see the history of present illness.   Otherwise, review of systems are positive for none.   All other systems are reviewed and negative.    PHYSICAL EXAM: VS:  There were no vitals taken for this visit. , BMI There is no height or weight on file to calculate BMI. GENERAL:  Well appearing HEENT:  Pupils equal round and reactive, fundi not visualized, oral mucosa unremarkable NECK:  No jugular venous distention, waveform within normal limits, carotid upstroke brisk and symmetric, no bruits LUNGS:  Clear to auscultation bilaterally HEART:  Irregularly irregular.  Tachycardic.  PMI not displaced or sustained,S1 and S2 within normal limits, no S3, no S4, no clicks, no rubs, no murmurs/. Distant heart sounds ABD:  Flat, positive bowel sounds normal in frequency in pitch, no bruits, no  rebound, no guarding, no midline pulsatile mass, no hepatomegaly, no splenomegaly EXT:  Unable to palpate pedal or popliteal pulses bilaterally.  2+Femoral and radial pulses.  Trace edema, no cyanosis no clubbing SKIN:  No rashes no nodules NEURO:  Cranial nerves II through XII grossly intact, motor grossly intact throughout PSYCH:  Cognitively intact, oriented to person place and time  EKG:  EKG is ordered today. The ekg ordered today demonstrates atrial fibrillation.  Rate 111 bpm. 12/30/20: Atrial fibrillation.  Rte 86 bpm.  Low voltage.     Recent Labs: 08/20/2020: Hemoglobin 13.2; Platelets 159.0 01/08/2021: ALT 12 02/05/2021: BUN 28; Creatinine, Ser 1.06; Potassium 4.1; Sodium 140    Lipid Panel    Component Value Date/Time   CHOL 134 01/08/2021 0903   TRIG 130 01/08/2021 0903   TRIG 141 04/10/2006 0812   HDL 55 01/08/2021 0903   CHOLHDL 2.4 01/08/2021 0903   CHOLHDL 2.6 12/10/2019 0523   VLDL 15 12/10/2019 0523   LDLCALC 56 01/08/2021 0903   LDLDIRECT 46.0 06/10/2019 1132      Wt Readings from Last 3 Encounters:  03/11/21 164 lb (74.4 kg)  03/03/21 162 lb 9.6 oz (73.8 kg)  02/24/21 163 lb 6.4 oz (74.1 kg)      ASSESSMENT AND PLAN:  # Paroxysmal atrial fibrillation:  Ms. Kunz is doing well.  Continue Eliquis and metoprolol.  # Hypertension:  Pressure poorly-controlled.  She is going to check it at home and bring to follow-up in 1 month.  For now continue metoprolol.  # PAD:  # Hyperlipidemia: Ms. Kosa is s/p re-do R femoral-popliteal bypass 8/2021s.  Lipids are at goal.  Continue atorvastatin.  Continue Eliquis.   Current medicines are reviewed at length with the patient today.  The patient does not have concerns regarding medicines.  The following changes have been made:  none  Labs/ tests ordered today include:   No orders of the defined types were placed in this encounter.     Disposition:   FU with Tiffany C. Oval Linsey, MD, [facility] in 6 months.   PharmD in 1 month for BP control.    Signed, Tiffany C. Oval Linsey, MD, [facility]  03/30/2021 9:14 AM    [place]

## 2021-03-31 ENCOUNTER — Ambulatory Visit (HOSPITAL_BASED_OUTPATIENT_CLINIC_OR_DEPARTMENT_OTHER): Payer: Medicare Other | Admitting: Cardiovascular Disease

## 2021-04-12 ENCOUNTER — Other Ambulatory Visit: Payer: Self-pay | Admitting: Physical Medicine and Rehabilitation

## 2021-04-13 ENCOUNTER — Ambulatory Visit: Payer: Medicare Other

## 2021-04-13 ENCOUNTER — Other Ambulatory Visit: Payer: Self-pay

## 2021-04-13 DIAGNOSIS — R0683 Snoring: Secondary | ICD-10-CM

## 2021-04-13 DIAGNOSIS — G4733 Obstructive sleep apnea (adult) (pediatric): Secondary | ICD-10-CM

## 2021-04-14 ENCOUNTER — Telehealth: Payer: Self-pay | Admitting: Pulmonary Disease

## 2021-04-14 DIAGNOSIS — G4733 Obstructive sleep apnea (adult) (pediatric): Secondary | ICD-10-CM

## 2021-04-14 NOTE — Telephone Encounter (Signed)
HST 04/13/21 >> AHI 10.8, SpO2 low 81%   Please inform her that her sleep study shows mild obstructive sleep apnea.  Please arrange for ROV with me or NP to discuss treatment options.

## 2021-04-16 NOTE — Telephone Encounter (Signed)
Called and gave pt results. She voiced understanding. Appt made for 05/03/2021 with Geraldo Pitter NP

## 2021-04-20 ENCOUNTER — Telehealth: Payer: Self-pay | Admitting: Family Medicine

## 2021-04-20 NOTE — Chronic Care Management (AMB) (Signed)
°  Care Management   Follow Up Note   04/20/2021 Name: IMONI KOHEN MRN: [id number] DOB: 12-06-1924   Referred by: Marin Olp, MD Reason for referral : No chief complaint on file.   An unsuccessful telephone outreach was attempted today. The patient was referred to the case management team for assistance with care management and care coordination.   Follow Up Plan: The care management team will reach out to the patient again over the next 7 days.   [place]

## 2021-04-29 ENCOUNTER — Other Ambulatory Visit: Payer: Self-pay

## 2021-04-29 ENCOUNTER — Encounter (HOSPITAL_BASED_OUTPATIENT_CLINIC_OR_DEPARTMENT_OTHER): Payer: Self-pay | Admitting: Nurse Practitioner

## 2021-04-29 ENCOUNTER — Ambulatory Visit (INDEPENDENT_AMBULATORY_CARE_PROVIDER_SITE_OTHER): Payer: Medicare Other | Admitting: Nurse Practitioner

## 2021-04-29 VITALS — BP 132/82 | HR 71 | Ht 59.0 in | Wt 163.5 lb

## 2021-04-29 DIAGNOSIS — I739 Peripheral vascular disease, unspecified: Secondary | ICD-10-CM | POA: Diagnosis not present

## 2021-04-29 DIAGNOSIS — I4819 Other persistent atrial fibrillation: Secondary | ICD-10-CM

## 2021-04-29 DIAGNOSIS — I5032 Chronic diastolic (congestive) heart failure: Secondary | ICD-10-CM

## 2021-04-29 DIAGNOSIS — Z7901 Long term (current) use of anticoagulants: Secondary | ICD-10-CM | POA: Diagnosis not present

## 2021-04-29 DIAGNOSIS — I10 Essential (primary) hypertension: Secondary | ICD-10-CM | POA: Diagnosis not present

## 2021-04-29 NOTE — Patient Instructions (Signed)
Medication Instructions:  Your physician recommends that you continue on your current medications as directed. Please refer to the Current Medication list given to you today.  *If you need a refill on your cardiac medications before your next appointment, please call your pharmacy*   Lab Work: Your physician recommends that you return for lab work today: BMET/CBC  If you have labs (blood work) drawn today and your tests are completely normal, you will receive your results only by: MyChart Message (if you have MyChart) OR A paper copy in the mail If you have any lab test that is abnormal or we need to change your treatment, we will call you to review the results.  Follow-Up: At [facility], you and your health needs are our priority.  As part of our continuing mission to provide you with exceptional heart care, we have created designated Provider Care Teams.  These Care Teams include your primary Cardiologist (physician) and Advanced Practice Providers (APPs -  Physician Assistants and Nurse Practitioners) who all work together to provide you with the care you need, when you need it.  We recommend signing up for the patient portal called "MyChart".  Sign up information is provided on this After Visit Summary.  MyChart is used to connect with patients for Virtual Visits (Telemedicine).  Patients are able to view lab/test results, encounter notes, upcoming appointments, etc.  Non-urgent messages can be sent to your provider as well.   To learn more about what you can do with MyChart, go to [email or web address].    Your next appointment:   6 month(s)  The format for your next appointment:   In Person  Provider:   Skeet Latch, MD

## 2021-04-29 NOTE — Progress Notes (Signed)
Cardiology Office Note:    Date:  04/29/2021   ID:  [name] [name], DOB 12-28-1924, MRN [id number]  PCP:  Marin Olp, MD   [facility] Providers Cardiologist:  Skeet Latch, MD     Referring MD: Marin Olp, MD   Chief Complaint: 3 month f/u hypertension  History of Present Illness:    [name] [name] is a 88 y.o. female with a hx of persistent atrial fibrillation, chronic HFpEF, COPD, HTN, PAD post right femoral to above-knee popliteal bypass (2006), and hyperlipidemia.   She underwent revision of femoral to avove-knee bypass in 2010. This was extended to below the knee popliteal artery. She has a known stenosis of her peripheral bypass by arteriogram 2/20 which revealed 80% narrowing of the mid right femoral popliteal bypass graft.  She also had 70% stenosis of her left common femoral artery w/patent left leg bypass 3 vessel runoff.  She originally deferred further intervention due to her age, but with increasing symptoms she elected to undergo intervention in 11/2019. She came into our office on 11/27/2019 for preoperative clearance and was found to be in atrial fibrillation at a rate of 111 bpm. This was a new finding for her. She was completely asymptomatic at the time and was on chronic warfarin for PAD. CHADS2VASC score was 5. She was started on Toprol for rate control.  She was seen in A. fib clinic on 12/03/2019 and no changes were made to her medication regimen. She underwent redo right fem-pop bypass and left common fem endarterectomy pericardial patch on 12/09/19. Her hospital course was complicated by AF with RVR and rectal bleeding due to acute colitis. She was later transitioned to Eliquis and completed a comprehensive rehabilitation program. At cardiology f/u following hospitalization and rehab, she was found to be volume up and was prescribed higher dose of Lasix.   She was last seen in our office by Dr. Oval Linsey on 12/30/20. At the visit, she reported lower  extremity edema but was not wearing compression socks.More noticeable on humid days. Edema improved with elevation and she was taking Lasix almost daily. Also reported that her weight was up.  Her blood pressure was elevated in the office, however she thought it was well controlled at home and wanted to track it and call to report.  On lab work her kidney function was a little worse and she was encouraged to reduce Lasix from 40 to 20 mg a day by Dr. Oval Linsey and to return for repeat lab work 2 weeks later.  Kidney function was improved to near normal on 02/05/2021. We did not receive BP readings from the patient.  Today, she is here alone. She continues to live alone and is very independent doing all of her own self-care, shopping and she continues to drive.  She has family and neighbors close by who check on her.  She reports she has been short of breath since her vascular surgery 8/21. Denies recent increase in dyspnea. She denies chest pain, fatigue, palpitations, melena, hematuria, hemoptysis, diaphoresis, weakness, presyncope, syncope, orthopnea, and PND.  Feels an occasional catch in her throat which she associates with atrial fibrillation. Brings in BP readings from home that are mostly < 140/80. She reports she is very sensitive to any sort of stressor and even a negative thought elevates her blood pressure. She overall feels well and does anything she needs or wants to do.    Past Medical History:  Diagnosis Date   Acute on chronic  diastolic heart failure ([place]) 12/30/2020   Allergic rhinoconjunctivitis    Allergy    Anxiety    pt. managed- uses deep breathing    Arthritis    low back , stenosis   COLONIC POLYPS, RECURRENT 08/29/2006   2008 last colonoscopy. No further colonoscopy.      COPD (chronic obstructive pulmonary disease) (HCC)    Diverticulosis    Dysrhythmia    afib   Eczema    Environmental allergies    allergy shot- q friday in Dr. Janee Morn office. PFT's abnormal- recommended  Spiriva to use preop & will d/c after surgery   GERD (gastroesophageal reflux disease)    Hiatal hernia    Hyperlipidemia    Hypertension    Lung nodule 2011   Paroxysmal atrial fibrillation ([place]) 11/27/2019   Peripheral vascular disease (HCC)    Shingles    Stenosis of popliteal artery (HCC)    blood clots in legs long ago     Trigeminal neuralgia    Left buttocks    Past Surgical History:  Procedure Laterality Date   ABDOMINAL AORTAGRAM N/A 06/17/2011   Procedure: ABDOMINAL Maxcine Ham;  Surgeon: Elam Dutch, MD;  Location: [facility] CATH LAB;  Service: Cardiovascular;  Laterality: N/A;   ABDOMINAL AORTOGRAM W/LOWER EXTREMITY Bilateral 06/22/2018   Procedure: ABDOMINAL AORTOGRAM W/LOWER EXTREMITY;  Surgeon: Elam Dutch, MD;  Location: [place] CV LAB;  Service: Cardiovascular;  Laterality: Bilateral;   ABDOMINAL AORTOGRAM W/LOWER EXTREMITY Bilateral 11/29/2019   Procedure: ABDOMINAL AORTOGRAM W/LOWER EXTREMITY;  Surgeon: Elam Dutch, MD;  Location: [place] CV LAB;  Service: Cardiovascular;  Laterality: Bilateral;   CATARACT EXTRACTION, BILATERAL     w IOL   CHOLECYSTECTOMY  1998   COLONOSCOPY     ENDARTERECTOMY FEMORAL Left 12/09/2019   Procedure: ENDARTERECTOMY FEMORAL with bovine patch angioplasty.;  Surgeon: Elam Dutch, MD;  Location: [place];  Service: Vascular;  Laterality: Left;   FEMORAL-POPLITEAL BYPASS GRAFT        x2 surgeries 1990's & 2009   FEMORAL-POPLITEAL BYPASS GRAFT Right 12/09/2019   Procedure: REDO RIGHT FEMORAL-POPLITEAL ARTERY BYPASS GRAFT;  Surgeon: Elam Dutch, MD;  Location: [place];  Service: Vascular;  Laterality: Right;   INJECTION KNEE Right Aug. 2016   Gel injection for pain   LUMBAR FUSION  07/06/2011   TONSILLECTOMY     as a teenager    TUBAL LIGATION      Current Medications: Current Meds  Medication Sig   acetaminophen (TYLENOL) 325 MG tablet Take 1-2 tablets (325-650 mg total) by mouth every 4 (four) hours as needed for  mild pain (or temp >/= 101 F).   apixaban (ELIQUIS) 5 MG TABS tablet Take 1 tablet by mouth twice daily   atorvastatin (LIPITOR) 40 MG tablet Take 1 tablet (40 mg total) by mouth daily.   baclofen (LIORESAL) 10 MG tablet TAKE 1/2 (ONE-HALF) TABLET BY MOUTH NIGHTLY   furosemide (LASIX) 40 MG tablet Take 40 mg by mouth as directed. TAKE 1/2 TABLET DAILY   ipratropium (ATROVENT) 0.06 % nasal spray Place 2 sprays into the nose 2 (two) times daily.   lisinopril (ZESTRIL) 5 MG tablet Take 1 tablet by mouth once daily   metoprolol succinate (TOPROL-XL) 25 MG 24 hr tablet Take 3 tablets by mouth once daily   Multiple Vitamin (MULTIVITAMIN WITH MINERALS) TABS tablet Take 1 tablet by mouth daily.   pantoprazole (PROTONIX) 40 MG tablet Take one tablet twice daily as directed   RESTASIS  0.05 % ophthalmic emulsion Place 1 drop into both eyes 2 (two) times daily as needed (dry eyes).      Allergies:   Sulfa antibiotics, Tiotropium bromide, Latex, Ultram [tramadol], and 5-alpha reductase inhibitors   Social History   Socioeconomic History   Marital status: Single    Spouse name: Not on file   Number of children: 4   Years of education: 12   Highest education level: Not on file  Occupational History   Occupation: retired    Fish farm manager: RETIRED  Tobacco Use   Smoking status: Former    Packs/day: 2.00    Years: 30.00    Pack years: 60.00    Types: Cigarettes    Quit date: 06/18/1993    Years since quitting: 27.8   Smokeless tobacco: Never   Tobacco comments:    QUIT IN 1995  Vaping Use   Vaping Use: Never used  Substance and Sexual Activity   Alcohol use: No    Alcohol/week: 0.0 standard drinks   Drug use: No   Sexual activity: Not on file  Other Topics Concern   Not on file  Social History Narrative   Patient is widowed with 4 children. 2 grandkids. Lives alone.    Patient is right handed.   Patient has high school education.   Patient drinks 1 cup daily.      Retired from [employer] for 28 years, works 2 days a week until 2016.       Hobbies: volunteers at [employer], [place] from church visits people.       No caffeine.    Social Determinants of Health   Financial Resource Strain: Low Risk    Difficulty of Paying Living Expenses: Not hard at all  Food Insecurity: No Food Insecurity   Worried About Charity fundraiser in the Last Year: Never true   [place] in the Last Year: Never true  Transportation Needs: No Transportation Needs   Lack of Transportation (Medical): No   Lack of Transportation (Non-Medical): No  Physical Activity: Sufficiently Active   Days of Exercise per Week: 7 days   Minutes of Exercise per Session: 30 min  Stress: No Stress Concern Present   Feeling of Stress : Not at all  Social Connections: Moderately Integrated   Frequency of Communication with Friends and Family: More than three times a week   Frequency of Social Gatherings with Friends and Family: Twice a week   Attends Religious Services: More than 4 times per year   Active Member of [employer] or Organizations: Yes   Attends Archivist Meetings: 1 to 4 times per year   Marital Status: Widowed     Family History: The patient's family history includes Arthritis in her mother; Brain cancer in her sister; Breast cancer in her daughter and sister; Colon cancer in her sister; Colon polyps in her father; Heart disease in her father; Hypertension in her mother and son; Lung cancer in her brother and sister. There is no history of Anesthesia problems, Hypotension, Malignant hyperthermia, or Pseudochol deficiency.  ROS:   Please see the history of present illness.   +mild lower extremity edema All other systems reviewed and are negative.  Labs/Other Studies Reviewed:    The following studies were reviewed today:  Echo 8/21  Left Ventricle: Left ventricular ejection fraction, by estimation, is 60  to 65%. The left ventricle has normal function. The  left ventricle has no  regional wall  motion abnormalities. The left ventricular internal cavity  size was mildly dilated. There is no left ventricular hypertrophy. Left ventricular diastolic function could not be evaluated due to atrial fibrillation. Left ventricular  diastolic function could not be evaluated.  Right Ventricle: The right ventricular size is normal. No increase in  right ventricular wall thickness. Right ventricular systolic function is  normal. There is mildly elevated pulmonary artery systolic pressure. The  tricuspid regurgitant velocity is 2.65  m/s, and with an assumed right atrial pressure of 10 mmHg, the estimated  right ventricular systolic pressure is 40.9 mmHg.  Left Atrium: Left atrial size was mildly dilated.  Right Atrium: Right atrial size was normal in size.  Pericardium: Trivial pericardial effusion is present.  Mitral Valve: The mitral valve is normal in structure. No evidence of  mitral valve regurgitation. No evidence of mitral valve stenosis.  Tricuspid Valve: The tricuspid valve is normal in structure. Tricuspid  valve regurgitation is trivial. No evidence of tricuspid stenosis.  Aortic Valve: The aortic valve is grossly normal. Aortic valve  regurgitation is not visualized. No aortic stenosis is present.  Pulmonic Valve: The pulmonic valve was normal in structure. Pulmonic valve  regurgitation is not visualized. No evidence of pulmonic stenosis.  Aorta: The aortic root and ascending aorta are structurally normal, with  no evidence of dilitation.  IAS/Shunts: The atrial septum is grossly normal.   Recent Labs: 08/20/2020: Hemoglobin 13.2; Platelets 159.0 01/08/2021: ALT 12 02/05/2021: BUN 28; Creatinine, Ser 1.06; Potassium 4.1; Sodium 140  Recent Lipid Panel    Component Value Date/Time   CHOL 134 01/08/2021 0903   TRIG 130 01/08/2021 0903   TRIG 141 04/10/2006 0812   HDL 55 01/08/2021 0903   CHOLHDL 2.4 01/08/2021 0903   CHOLHDL 2.6 12/10/2019  0523   VLDL 15 12/10/2019 0523   LDLCALC 56 01/08/2021 0903   LDLDIRECT 46.0 06/10/2019 1132     Risk Assessment/Calculations:    CHA2DS2-VASc Score = 5  This indicates a 7.2% annual risk of stroke. The patient's score is based upon: CHF History: 0 HTN History: 1 Diabetes History: 0 Stroke History: 0 Vascular Disease History: 1 Age Score: 2 Gender Score: 1       Physical Exam:    VS:  BP 132/82    Pulse 71    Ht 4\' 11"  (1.499 m)    Wt 163 lb 8 oz (74.2 kg)    BMI 33.02 kg/m     Wt Readings from Last 3 Encounters:  04/29/21 163 lb 8 oz (74.2 kg)  03/11/21 164 lb (74.4 kg)  03/03/21 162 lb 9.6 oz (73.8 kg)     GEN:  Well nourished, well developed in no acute distress HEENT: Normal NECK: No JVD; No carotid bruits LYMPHATICS: No lymphadenopathy CARDIAC: Irregular RR, no murmurs, rubs, gallops RESPIRATORY:  Clear to auscultation without rales, wheezing or rhonchi  ABDOMEN: Soft, non-tender, non-distended MUSCULOSKELETAL:  No edema; No deformity  SKIN: Warm and dry NEUROLOGIC:  Alert and oriented x 3 PSYCHIATRIC:  Normal affect   EKG:  EKG is not ordered today.   Diagnoses:    1. Essential hypertension   2. Chronic anticoagulation   3. Persistent atrial fibrillation ([place])   4. PAD (peripheral artery disease) ([place])   5. Chronic heart failure with preserved ejection fraction (HFpEF) (HCC)    Assessment and Plan:     Essential hypertension: Blood pressure is well controlled today.  I checked her home machine and got a similar reading to  our manual cuff.  She also brought a log of blood pressure readings from home with only a few elevated readings > 140/80 mmHg.  She takes her blood pressure early in the morning because she takes lisinopril at bed time. I encouraged her to wait an hour or two after taking morning medication to check BP and suspect that she may have even better BP readings at this time of day. Continue lisinopril, Toprol.  Persistent atrial  fibrillation on chronic anticoagulation: Mostly asymptomatic although she does report chronic dyspnea since hospitalization for vascular surgery 8/21. This could be multifactorial due to age, deconditioning, and persistent a fib. Denies palpitations. No bleeding problems. Will get BMET/CBC today. Continue Eliquis, Toprol.   PAD: Reports no claudication since vascular surgery 8/21. Has an upcoming appointment with Dr. Carlis Abbott who is newly assigned to her since Dr. Oneida Alar retired. Mild LE edema. Using leg elevation and wears compression if she is on her feet for long periods of time. No acute concerns today. Plan per VVS.   HFpEF: Reports chronic dyspnea since hospitalization 8/21 and an occasional "catch" in her breathing. Denies orthopnea, PND. She has mild non-pitting bilateral lower extremity edema. Otherwise appears euvolemic. Does not have reduced activity tolerance. Her Lasix was reduced from 40 mg daily to 20 mg daily in Sept 2022 due to elevated creatinine. Feels that leg edema has been stable since reduction in Lasix. Will recheck bmet today. Continue diuretic, ACE-I.    Disposition: 6 months with Dr. Oval Linsey   Medication Adjustments/Labs and Tests Ordered: Current medicines are reviewed at length with the patient today.  Concerns regarding medicines are outlined above.  Orders Placed This Encounter  Procedures   Basic metabolic panel   CBC   No orders of the defined types were placed in this encounter.   Patient Instructions  Medication Instructions:  Your physician recommends that you continue on your current medications as directed. Please refer to the Current Medication list given to you today.  *If you need a refill on your cardiac medications before your next appointment, please call your pharmacy*   Lab Work: Your physician recommends that you return for lab work today: BMET/CBC  If you have labs (blood work) drawn today and your tests are completely normal, you will  receive your results only by: MyChart Message (if you have MyChart) OR A paper copy in the mail If you have any lab test that is abnormal or we need to change your treatment, we will call you to review the results.  Follow-Up: At [facility], you and your health needs are our priority.  As part of our continuing mission to provide you with exceptional heart care, we have created designated Provider Care Teams.  These Care Teams include your primary Cardiologist (physician) and Advanced Practice Providers (APPs -  Physician Assistants and Nurse Practitioners) who all work together to provide you with the care you need, when you need it.  We recommend signing up for the patient portal called "MyChart".  Sign up information is provided on this After Visit Summary.  MyChart is used to connect with patients for Virtual Visits (Telemedicine).  Patients are able to view lab/test results, encounter notes, upcoming appointments, etc.  Non-urgent messages can be sent to your provider as well.   To learn more about what you can do with MyChart, go to [email or web address].    Your next appointment:   6 month(s)  The format for your next appointment:   In Person  Provider:  Skeet Latch, MD     Signed, Ann Maki, Lanice Schwab, NP  04/29/2021 4:15 PM    [facility]

## 2021-04-30 ENCOUNTER — Telehealth (HOSPITAL_BASED_OUTPATIENT_CLINIC_OR_DEPARTMENT_OTHER): Payer: Self-pay

## 2021-04-30 DIAGNOSIS — Z7901 Long term (current) use of anticoagulants: Secondary | ICD-10-CM

## 2021-04-30 DIAGNOSIS — I10 Essential (primary) hypertension: Secondary | ICD-10-CM

## 2021-04-30 LAB — CBC
Hematocrit: 41.4 % (ref 34.0–46.6)
Hemoglobin: 13.9 g/dL (ref 11.1–15.9)
MCH: 28.5 pg (ref 26.6–33.0)
MCHC: 33.6 g/dL (ref 31.5–35.7)
MCV: 85 fL (ref 79–97)
Platelets: 199 10*3/uL (ref 150–450)
RBC: 4.88 x10E6/uL (ref 3.77–5.28)
RDW: 13.1 % (ref 11.7–15.4)
WBC: 9.8 10*3/uL (ref 3.4–10.8)

## 2021-04-30 LAB — BASIC METABOLIC PANEL
BUN/Creatinine Ratio: 15 (ref 12–28)
BUN: 21 mg/dL (ref 10–36)
Creatinine, Ser: 1.41 mg/dL — ABNORMAL HIGH (ref 0.57–1.00)
Glucose: 84 mg/dL (ref 70–99)
eGFR: 35 mL/min/{1.73_m2} — ABNORMAL LOW (ref >59)

## 2021-04-30 NOTE — Telephone Encounter (Signed)
Attempted to call patient no answer

## 2021-05-03 ENCOUNTER — Other Ambulatory Visit: Payer: Self-pay

## 2021-05-03 ENCOUNTER — Encounter: Payer: Self-pay | Admitting: Primary Care

## 2021-05-03 ENCOUNTER — Ambulatory Visit (INDEPENDENT_AMBULATORY_CARE_PROVIDER_SITE_OTHER): Payer: Medicare Other | Admitting: Primary Care

## 2021-05-03 ENCOUNTER — Telehealth (HOSPITAL_BASED_OUTPATIENT_CLINIC_OR_DEPARTMENT_OTHER): Payer: Self-pay

## 2021-05-03 DIAGNOSIS — G4730 Sleep apnea, unspecified: Secondary | ICD-10-CM

## 2021-05-03 NOTE — Telephone Encounter (Signed)
-----   Message from Emmaline Life, NP sent at 04/30/2021  3:59 PM EST ----- Please call patient and report that unfortunately the person that drew her blood yesterday used the wrong tube for the BMET so the results are not reliable. We need her to come back on Monday to have it redrawn at no charge. Our regular lab technician will be looking out for her and we do apologize. CBC results are stable.

## 2021-05-03 NOTE — Telephone Encounter (Signed)
Called patient and reviewed lab results with her. She is going to come one day this week for her repeat lab work!

## 2021-05-03 NOTE — Progress Notes (Signed)
Reviewed and agree with assessment/plan.   Chesley Mires, MD [facility] Pulmonary/Critical Care 05/03/2021, 1:53 PM Pager:  [phone]

## 2021-05-03 NOTE — Patient Instructions (Addendum)
Home sleep study showed mild OSA, minimal SP02 was 81% (average 93%)  Treatment options include weight loss, oral appliance, CPAP or referral to ENT  You are no a candidate for oral appliance and have elected not to have any surgical procedure  For now conservatively manage with side sleeping position. If you decide you want to try CPAP please contact office. We may add oxygen at night  Do not drive if experiencing excessive daytime sleepiness   Follow-up: 6 months with Dr. Halford Chessman    Sleep Apnea Sleep apnea affects breathing during sleep. It causes breathing to stop for 10 seconds or more, or to become shallow. People with sleep apnea usually snore loudly. It can also increase the risk of: Heart attack. Stroke. Being very overweight (obese). Diabetes. Heart failure. Irregular heartbeat. High blood pressure. The goal of treatment is to help you breathe normally again. What are the causes? The most common cause of this condition is a collapsed or blocked airway. There are three kinds of sleep apnea: Obstructive sleep apnea. This is caused by a blocked or collapsed airway. Central sleep apnea. This happens when the brain does not send the right signals to the muscles that control breathing. Mixed sleep apnea. This is a combination of obstructive and central sleep apnea. What increases the risk? Being overweight. Smoking. Having a small airway. Being older. Being female. Drinking alcohol. Taking medicines to calm yourself (sedatives or tranquilizers). Having family members with the condition. Having a tongue or tonsils that are larger than normal. What are the signs or symptoms? Trouble staying asleep. Loud snoring. Headaches in the morning. Waking up gasping. Dry mouth or sore throat in the morning. Being sleepy or tired during the day. If you are sleepy or tired during the day, you may also: Not be able to focus your mind (concentrate). Forget things. Get angry a lot and  have mood swings. Feel sad (depressed). Have changes in your personality. Have less interest in sex, if you are female. Be unable to have an erection, if you are female. How is this treated?  Sleeping on your side. Using a medicine to get rid of mucus in your nose (decongestant). Avoiding the use of alcohol, medicines to help you relax, or certain pain medicines (narcotics). Losing weight, if needed. Changing your diet. Quitting smoking. Using a machine to open your airway while you sleep, such as: An oral appliance. This is a mouthpiece that shifts your lower jaw forward. A CPAP device. This device blows air through a mask when you breathe out (exhale). An EPAP device. This has valves that you put in each nostril. A BIPAP device. This device blows air through a mask when you breathe in (inhale) and breathe out. Having surgery if other treatments do not work. Follow these instructions at home: Lifestyle Make changes that your doctor recommends. Eat a healthy diet. Lose weight if needed. Avoid alcohol, medicines to help you relax, and some pain medicines. Do not smoke or use any products that contain nicotine or tobacco. If you need help quitting, ask your doctor. General instructions Take over-the-counter and prescription medicines only as told by your doctor. If you were given a machine to use while you sleep, use it only as told by your doctor. If you are having surgery, make sure to tell your doctor you have sleep apnea. You may need to bring your device with you. Keep all follow-up visits. Contact a doctor if: The machine that you were given to use during sleep  bothers you or does not seem to be working. You do not get better. You get worse. Get help right away if: Your chest hurts. You have trouble breathing in enough air. You have an uncomfortable feeling in your back, arms, or stomach. You have trouble talking. One side of your body feels weak. A part of your face is  hanging down. These symptoms may be an emergency. Get help right away. Call your local emergency services (911 in the [place] Do not wait to see if the symptoms will go away. Do not drive yourself to the hospital. Summary This condition affects breathing during sleep. The most common cause is a collapsed or blocked airway. The goal of treatment is to help you breathe normally while you sleep. This information is not intended to replace advice given to you by your health care provider. Make sure you discuss any questions you have with your health care provider. Document Revised: 11/18/2020 Document Reviewed: 03/20/2020 Elsevier Patient Education  2022 [employer].

## 2021-05-03 NOTE — Progress Notes (Addendum)
@Patient  ID: [name] [name], female    DOB: 03-Dec-1924, 88 y.o.   MRN: [id number]  Chief Complaint  Patient presents with   Follow-up    Patient is here for results. Patient says she is doing great and has no complaints.     Referring provider: Marin Olp, MD  HPI: 27 year of female, former smoked. PMH for COPD mised type, allergic rhinitis, chronic diastolic heart failure, HTN, afib, snoring. Patient of Dr. Halford Chessman.   05/03/2021- Interim hx  Patient presents today to review sleep study results. She is doing well. She does wake herself up snoring at times but otherwise she is sleeping alright at night. No residual daytime sleepiness. Home sleep study on 04/13/21 showed mild OSA, AHI 10.8/hr with SpO2 low 81% (baseline 93%). We revealed sleep study results, risk of treatment sleep apnea and treatment options. She is not oral appliance candidate and is not interested in CPAP therapy or surgical procedures.    Allergies  Allergen Reactions   Sulfa Antibiotics Other (See Comments)    Cold sweat light headed and disorientation   Tiotropium Bromide Shortness Of Breath and Other (See Comments)    Sore throat also   Latex Itching and Rash   Ultram [Tramadol] Nausea And Vomiting   5-Alpha Reductase Inhibitors     Immunization History  Administered Date(s) Administered   Fluad Quad(high Dose 65+) 01/14/2019, 02/12/2020, 01/21/2021   Influenza Split 12/25/2011   Influenza Whole 01/23/2010   Influenza,inj,Quad PF,6+ Mos 12/31/2012, 12/26/2014, 01/08/2016   Influenza-Unspecified 01/23/2014, 12/25/2017   PFIZER(Purple Top)SARS-COV-2 Vaccination 05/14/2019, 06/03/2019, 01/22/2020, 08/05/2020   PPD Test 06/24/2011   Pneumococcal Conjugate-13 08/31/2015   Pneumococcal Polysaccharide-23 04/25/2005   Td 04/25/2005   Tdap 01/14/2019   Zoster Recombinat (Shingrix) 04/20/2017, 06/29/2017   Zoster, Live 03/02/2010    Past Medical History:  Diagnosis Date   Acute on chronic diastolic  heart failure ([place]) 12/30/2020   Allergic rhinoconjunctivitis    Allergy    Anxiety    pt. managed- uses deep breathing    Arthritis    low back , stenosis   COLONIC POLYPS, RECURRENT 08/29/2006   2008 last colonoscopy. No further colonoscopy.      COPD (chronic obstructive pulmonary disease) (HCC)    Diverticulosis    Dysrhythmia    afib   Eczema    Environmental allergies    allergy shot- q friday in Dr. Janee Morn office. PFT's abnormal- recommended Spiriva to use preop & will d/c after surgery   GERD (gastroesophageal reflux disease)    Hiatal hernia    Hyperlipidemia    Hypertension    Lung nodule 2011   Paroxysmal atrial fibrillation ([place]) 11/27/2019   Peripheral vascular disease (HCC)    Shingles    Stenosis of popliteal artery (HCC)    blood clots in legs long ago     Trigeminal neuralgia    Left buttocks    Tobacco History: Social History   Tobacco Use  Smoking Status Former   Packs/day: 2.00   Years: 30.00   Pack years: 60.00   Types: Cigarettes   Quit date: 06/18/1993   Years since quitting: 27.8  Smokeless Tobacco Never  Tobacco Comments   QUIT IN 1995   Counseling given: Not Answered Tobacco comments: [place]   Outpatient Medications Prior to Visit  Medication Sig Dispense Refill   acetaminophen (TYLENOL) 325 MG tablet Take 1-2 tablets (325-650 mg total) by mouth every 4 (four) hours as needed for mild pain (  or temp >/= 101 F).     apixaban (ELIQUIS) 5 MG TABS tablet Take 1 tablet by mouth twice daily 180 tablet 1   atorvastatin (LIPITOR) 40 MG tablet Take 1 tablet (40 mg total) by mouth daily. 90 tablet 3   baclofen (LIORESAL) 10 MG tablet TAKE 1/2 (ONE-HALF) TABLET BY MOUTH NIGHTLY 30 tablet 0   furosemide (LASIX) 40 MG tablet Take 40 mg by mouth as directed. TAKE 1/2 TABLET DAILY     ipratropium (ATROVENT) 0.06 % nasal spray Place 2 sprays into the nose 2 (two) times daily. 15 mL 11   lisinopril (ZESTRIL) 5 MG tablet Take 1 tablet by mouth once  daily 90 tablet 3   metoprolol succinate (TOPROL-XL) 25 MG 24 hr tablet Take 3 tablets by mouth once daily 270 tablet 0   Multiple Vitamin (MULTIVITAMIN WITH MINERALS) TABS tablet Take 1 tablet by mouth daily.     pantoprazole (PROTONIX) 40 MG tablet Take one tablet twice daily as directed 60 tablet 11   RESTASIS 0.05 % ophthalmic emulsion Place 1 drop into both eyes 2 (two) times daily as needed (dry eyes).   99   No facility-administered medications prior to visit.   Review of Systems  Review of Systems  Constitutional: Negative.  Negative for fatigue.  Respiratory: Negative.      Physical Exam  BP 114/66 (BP Location: Right Arm, Patient Position: Sitting, Cuff Size: Normal)    Pulse 82    Temp (!) 97.5 F (36.4 C) (Oral)    Ht 4\' 11"  (1.499 m)    Wt 163 lb 12.8 oz (74.3 kg)    SpO2 97%    BMI 33.08 kg/m  Physical Exam   Lab Results:  CBC    Component Value Date/Time   WBC 9.8 04/29/2021 1444   WBC 8.2 08/20/2020 1159   RBC 4.88 04/29/2021 1444   RBC 4.70 08/20/2020 1159   HGB 13.9 04/29/2021 1444   HCT 41.4 04/29/2021 1444   PLT 199 04/29/2021 1444   MCV 85 04/29/2021 1444   MCH 28.5 04/29/2021 1444   MCH 26.6 (L) 02/19/2020 1347   MCHC 33.6 04/29/2021 1444   MCHC 33.8 08/20/2020 1159   RDW 13.1 04/29/2021 1444   LYMPHSABS 2.1 08/20/2020 1159   MONOABS 0.5 08/20/2020 1159   EOSABS 0.2 08/20/2020 1159   BASOSABS 0.1 08/20/2020 1159    BMET    Component Value Date/Time   NA CANCELED 04/29/2021 1444   K CANCELED 04/29/2021 1444   CL CANCELED 04/29/2021 1444   CO2 CANCELED 04/29/2021 1444   GLUCOSE 84 04/29/2021 1444   GLUCOSE 104 (H) 08/20/2020 1159   BUN 21 04/29/2021 1444   CREATININE 1.41 (H) 04/29/2021 1444   CREATININE 1.19 (H) 02/19/2020 1347   CALCIUM CANCELED 04/29/2021 1444   GFRNONAA 39 (L) 02/19/2020 1347   GFRAA 46 (L) 02/19/2020 1347    BNP No results found for: BNP  ProBNP No results found for: PROBNP  Imaging: No results  found.   Assessment & Plan:   Mild sleep apnea - Home sleep study on 04/13/21 showed mild OSA, AHI 10.8/hr with SpO2 low 81%. We reviewed sleep study results, risk of untreated sleep apnea and treatment options.  She is not oral appliance candidate and is not interested in CPAP therapy or surgical procedures. D/t sleep apnea being mild and patient being minimally symptomatic, recommend conservatively managing with side sleeping position and weight loss. If symptoms worsen consider CPAP therapy or ONO to  qualify for nocturnal oxygen. FU in 6 months or sooner if needed.      Martyn Ehrich, NP 05/03/2021

## 2021-05-03 NOTE — Assessment & Plan Note (Addendum)
-   Home sleep study on 04/13/21 showed mild OSA, AHI 10.8/hr with SpO2 low 81%. We reviewed sleep study results, risk of untreated sleep apnea and treatment options.  She is not oral appliance candidate and is not interested in CPAP therapy or surgical procedures. D/t sleep apnea being mild and patient being minimally symptomatic, recommend conservatively managing with side sleeping position and weight loss. If symptoms worsen consider CPAP therapy or ONO to qualify for nocturnal oxygen. FU in 6 months or sooner if needed.

## 2021-05-06 DIAGNOSIS — Z7901 Long term (current) use of anticoagulants: Secondary | ICD-10-CM | POA: Diagnosis not present

## 2021-05-07 ENCOUNTER — Telehealth (HOSPITAL_BASED_OUTPATIENT_CLINIC_OR_DEPARTMENT_OTHER): Payer: Self-pay

## 2021-05-07 ENCOUNTER — Telehealth: Payer: Self-pay | Admitting: Family Medicine

## 2021-05-07 LAB — BASIC METABOLIC PANEL
BUN/Creatinine Ratio: 21 (ref 12–28)
BUN: 23 mg/dL (ref 10–36)
CO2: 27 mmol/L (ref 20–29)
Calcium: 9.7 mg/dL (ref 8.7–10.3)
Chloride: 102 mmol/L (ref 96–106)
Creatinine, Ser: 1.08 mg/dL — ABNORMAL HIGH (ref 0.57–1.00)
Glucose: 120 mg/dL — ABNORMAL HIGH (ref 70–99)
Potassium: 3.9 mmol/L (ref 3.5–5.2)
Sodium: 146 mmol/L — ABNORMAL HIGH (ref 134–144)
eGFR: 48 mL/min/{1.73_m2} — ABNORMAL LOW (ref >59)

## 2021-05-07 NOTE — Chronic Care Management (AMB) (Signed)
°  Chronic Care Management   Note  05/07/2021 Name: TRENA DUNAVAN MRN: [id number] DOB: [birth year]  KEIMORA SWARTOUT is a 88 y.o. year old female who is a primary care patient of Marin Olp, MD. I reached out to [name] by phone today in response to a referral sent by Ms. Barbarann Ehlers [place]'s PCP, Marin Olp, MD.   Ms. Locken was given information about Chronic Care Management services today including:  CCM service includes personalized support from designated clinical staff supervised by her physician, including individualized plan of care and coordination with other care providers 24/7 contact phone numbers for assistance for urgent and routine care needs. Service will only be billed when office clinical staff spend 20 minutes or more in a month to coordinate care. Only one practitioner may furnish and bill the service in a calendar month. The patient may stop CCM services at any time (effective at the end of the month) by phone call to the office staff.   Patient agreed to services and verbal consent obtained.   Follow up plan:   Tatjana Secretary/administrator

## 2021-05-07 NOTE — Telephone Encounter (Signed)
Called patient to review lab results! Patient verbalized understanding!    "Her kidney function and electrolytes are stable. She can continue her medications and plan for follow-up in 6 months. "

## 2021-05-11 ENCOUNTER — Other Ambulatory Visit: Payer: Self-pay

## 2021-05-11 DIAGNOSIS — I739 Peripheral vascular disease, unspecified: Secondary | ICD-10-CM

## 2021-05-18 ENCOUNTER — Encounter
Payer: Medicare Other | Attending: Physical Medicine and Rehabilitation | Admitting: Physical Medicine and Rehabilitation

## 2021-05-18 ENCOUNTER — Other Ambulatory Visit: Payer: Self-pay

## 2021-05-18 VITALS — BP 130/81 | HR 88 | Ht 59.0 in | Wt 164.0 lb

## 2021-05-18 DIAGNOSIS — M791 Myalgia, unspecified site: Secondary | ICD-10-CM | POA: Insufficient documentation

## 2021-05-18 NOTE — Progress Notes (Signed)
Trigger Point Injection  Indication: Lumbar myofascial pain not relieved by medication management and other conservative care.  Informed consent was obtained after describing risk and benefits of the procedure with the patient, this includes bleeding, bruising, infection and medication side effects.  The patient wishes to proceed and has given written consent.  The patient was placed in a seated position.  The area of pain was marked and prepped with Betadine.  It was entered with a 25-gauge 1/2 inch needle and a total of 5 mL of 1% lidocaine and normal saline was injected into a total of 4 trigger points, after negative draw back for blood.  The patient tolerated the procedure well.  Post procedure instructions were given.     

## 2021-05-19 ENCOUNTER — Other Ambulatory Visit: Payer: Self-pay | Admitting: Cardiovascular Disease

## 2021-05-25 ENCOUNTER — Ambulatory Visit (INDEPENDENT_AMBULATORY_CARE_PROVIDER_SITE_OTHER)
Admission: RE | Admit: 2021-05-25 | Discharge: 2021-05-25 | Disposition: A | Discharge status: Home / Self Care | Payer: Medicare Other | Source: Ambulatory Visit | Attending: Vascular Surgery | Admitting: Vascular Surgery

## 2021-05-25 ENCOUNTER — Other Ambulatory Visit: Payer: Self-pay

## 2021-05-25 ENCOUNTER — Encounter: Payer: Self-pay | Admitting: Vascular Surgery

## 2021-05-25 ENCOUNTER — Ambulatory Visit (INDEPENDENT_AMBULATORY_CARE_PROVIDER_SITE_OTHER): Payer: Medicare Other | Admitting: Vascular Surgery

## 2021-05-25 ENCOUNTER — Ambulatory Visit (HOSPITAL_COMMUNITY)
Admission: RE | Admit: 2021-05-25 | Discharge: 2021-05-25 | Disposition: A | Discharge status: Home / Self Care | Payer: Medicare Other | Source: Ambulatory Visit | Attending: Vascular Surgery | Admitting: Vascular Surgery

## 2021-05-25 VITALS — BP 136/79 | HR 86 | Temp 97.9°F | Resp 16 | Ht 59.0 in | Wt 162.0 lb

## 2021-05-25 DIAGNOSIS — I739 Peripheral vascular disease, unspecified: Secondary | ICD-10-CM | POA: Insufficient documentation

## 2021-05-25 NOTE — Progress Notes (Signed)
Patient name: [name] [name] MRN: [id number] DOB: 19-Jan-1925 Sex: female  REASON FOR VISIT: 1 year follow-up for surveillance of bilateral lower extremity bypasses  HPI: [name] [name] is a 88 y.o. female with multiple medical problems as listed below that presents for one year follow-up and surveillance of her bilateral lower extremity bypasses.  Patient was previously under the care of Dr. Oneida Alar who retired from our practice.  Most recently she underwent a redo right common femoral to below-knee popliteal bypass with PTFE and a left common femoral endarterectomy and bovine patch (with remote history of history of left common femoral to above-knee popliteal bypass) all on 12/09/2019.  She reports no issues in the bilateral lower extremities.  She states her legs are not perfect but doing pretty well.  She is ambulatory with a cane.  She is on Eliquis.  No specific concerns today.  Past Medical History:  Diagnosis Date   Acute on chronic diastolic heart failure ([place]) 12/30/2020   Allergic rhinoconjunctivitis    Allergy    Anxiety    pt. managed- uses deep breathing    Arthritis    low back , stenosis   COLONIC POLYPS, RECURRENT 08/29/2006   2008 last colonoscopy. No further colonoscopy.      COPD (chronic obstructive pulmonary disease) (HCC)    Diverticulosis    Dysrhythmia    afib   Eczema    Environmental allergies    allergy shot- q friday in Dr. Janee Morn office. PFT's abnormal- recommended Spiriva to use preop & will d/c after surgery   GERD (gastroesophageal reflux disease)    Hiatal hernia    Hyperlipidemia    Hypertension    Lung nodule 2011   Paroxysmal atrial fibrillation ([place]) 11/27/2019   Peripheral vascular disease (HCC)    Shingles    Stenosis of popliteal artery (HCC)    blood clots in legs long ago     Trigeminal neuralgia    Left buttocks    Past Surgical History:  Procedure Laterality Date   ABDOMINAL AORTAGRAM N/A 06/17/2011   Procedure: ABDOMINAL Maxcine Ham;   Surgeon: Elam Dutch, MD;  Location[facility] CATH LAB;  Service: Cardiovascular;  Laterality: N/A;   ABDOMINAL AORTOGRAM W/LOWER EXTREMITY Bilateral 06/22/2018   Procedure: ABDOMINAL AORTOGRAM W/LOWER EXTREMITY;  Surgeon: Elam Dutch, MD;  Location: [facility] CV LAB;  Service: Cardiovascular;  Laterality: Bilateral;   ABDOMINAL AORTOGRAM W/LOWER EXTREMITY Bilateral 11/29/2019   Procedure: ABDOMINAL AORTOGRAM W/LOWER EXTREMITY;  Surgeon: Elam Dutch, MD;  Location: [facility] CV LAB;  Service: Cardiovascular;  Laterality: Bilateral;   CATARACT EXTRACTION, BILATERAL     w IOL   CHOLECYSTECTOMY  1998   COLONOSCOPY     ENDARTERECTOMY FEMORAL Left 12/09/2019   Procedure: ENDARTERECTOMY FEMORAL with bovine patch angioplasty.;  Surgeon: Elam Dutch, MD;  Location: [place];  Service: Vascular;  Laterality: Left;   FEMORAL-POPLITEAL BYPASS GRAFT        x2 surgeries 1990's & 2009   FEMORAL-POPLITEAL BYPASS GRAFT Right 12/09/2019   Procedure: REDO RIGHT FEMORAL-POPLITEAL ARTERY BYPASS GRAFT;  Surgeon: Elam Dutch, MD;  Location: [facility] OR;  Service: Vascular;  Laterality: Right;   INJECTION KNEE Right Aug. 2016   Gel injection for pain   LUMBAR FUSION  07/06/2011   TONSILLECTOMY     as a teenager    TUBAL LIGATION      Family History  Problem Relation Age of Onset   Arthritis Mother    Hypertension Mother  Heart disease Father    Colon polyps Father    Breast cancer Sister    Breast cancer Daughter    Hypertension Son    Lung cancer Brother    Colon cancer Sister    Brain cancer Sister    Lung cancer Sister    Anesthesia problems Neg Hx    Hypotension Neg Hx    Malignant hyperthermia Neg Hx    Pseudochol deficiency Neg Hx     SOCIAL HISTORY: Social History   Tobacco Use   Smoking status: Former    Packs/day: 2.00    Years: 30.00    Pack years: 60.00    Types: Cigarettes    Quit date: 06/18/1993    Years since quitting: 27.9   Smokeless tobacco: Never    Tobacco comments:    QUIT IN 1995  Substance Use Topics   Alcohol use: No    Alcohol/week: 0.0 standard drinks    Allergies  Allergen Reactions   Sulfa Antibiotics Other (See Comments)    Cold sweat light headed and disorientation   Tiotropium Bromide Shortness Of Breath and Other (See Comments)    Sore throat also   Latex Itching and Rash   Ultram [Tramadol] Nausea And Vomiting   5-Alpha Reductase Inhibitors     Current Outpatient Medications  Medication Sig Dispense Refill   acetaminophen (TYLENOL) 325 MG tablet Take 1-2 tablets (325-650 mg total) by mouth every 4 (four) hours as needed for mild pain (or temp >/= 101 F).     apixaban (ELIQUIS) 5 MG TABS tablet Take 1 tablet by mouth twice daily 180 tablet 1   atorvastatin (LIPITOR) 40 MG tablet Take 1 tablet (40 mg total) by mouth daily. 90 tablet 3   baclofen (LIORESAL) 10 MG tablet TAKE 1/2 (ONE-HALF) TABLET BY MOUTH NIGHTLY 30 tablet 0   furosemide (LASIX) 40 MG tablet Take 40 mg by mouth as directed. TAKE 1/2 TABLET DAILY     ipratropium (ATROVENT) 0.06 % nasal spray Place 2 sprays into the nose 2 (two) times daily. 15 mL 11   lisinopril (ZESTRIL) 5 MG tablet Take 1 tablet by mouth once daily 90 tablet 3   metoprolol succinate (TOPROL-XL) 25 MG 24 hr tablet Take 3 tablets by mouth once daily 270 tablet 1   Multiple Vitamin (MULTIVITAMIN WITH MINERALS) TABS tablet Take 1 tablet by mouth daily.     pantoprazole (PROTONIX) 40 MG tablet Take one tablet twice daily as directed 60 tablet 11   RESTASIS 0.05 % ophthalmic emulsion Place 1 drop into both eyes 2 (two) times daily as needed (dry eyes).   99   No current facility-administered medications for this visit.    REVIEW OF SYSTEMS:  [X]  denotes positive finding, [ ]  denotes negative finding Cardiac  Comments:  Chest pain or chest pressure:    Shortness of breath upon exertion:    Short of breath when lying flat:    Irregular heart rhythm:        Vascular    Pain in  calf, thigh, or hip brought on by ambulation:    Pain in feet at night that wakes you up from your sleep:     Blood clot in your veins:    Leg swelling:         Pulmonary    Oxygen at home:    Productive cough:     Wheezing:         Neurologic    Sudden weakness in arms  or legs:     Sudden numbness in arms or legs:     Sudden onset of difficulty speaking or slurred speech:    Temporary loss of vision in one eye:     Problems with dizziness:         Gastrointestinal    Blood in stool:     Vomited blood:         Genitourinary    Burning when urinating:     Blood in urine:        Psychiatric    Major depression:         Hematologic    Bleeding problems:    Problems with blood clotting too easily:        Skin    Rashes or ulcers:        Constitutional    Fever or chills:      PHYSICAL EXAM: Vitals:   05/25/21 1218  BP: 136/79  Pulse: 86  Resp: 16  Temp: 97.9 F (36.6 C)  TempSrc: Temporal  SpO2: 95%  Weight: 162 lb (73.5 kg)  Height: 4\' 11"  (1.499 m)    GENERAL: The patient is a well-nourished female, in no acute distress. The vital signs are documented above. CARDIAC: There is a regular rate and rhythm.  VASCULAR:  Bilateral femoral pulses palpable Both groin incisions well-healed Right leg has an above and below-knee popliteal exposure Left leg has an above-knee popliteal exposure PULMONARY: No respiratory distress ABDOMEN: Soft and non-tender. MUSCULOSKELETAL: There are no major deformities or cyanosis. NEUROLOGIC: No focal weakness or paresthesias are detected. SKIN: There are no ulcers or rashes noted. PSYCHIATRIC: The patient has a normal affect.  DATA:   Right ABI 1.0 with monophasic waveform and left ABI non-compressible  Bilateral lower extremity bypasses are patent with no stenosis visualized  Assessment/Plan:  88 year old female presents for surveillance of her bilateral lower extremity bypasses.  Most recently she underwent a redo  right common femoral to below-knee popliteal bypass with PTFE and also a left common femoral endarterectomy with bovine patch by Dr. Oneida Alar on 12/09/2019 (this is in the setting of a previous left common femoral to above-knee popliteal bypass).  Both bypasses are patent on duplex today with no visualized stenosis.  She is having no significant lower extremity symptoms.  I will see her in a year with continued surveillance.  She is on eliquis and lipitor.  Discussed she call with questions or concerns sooner.   Marty Heck, MD [facility] Office: [phone]

## 2021-06-01 ENCOUNTER — Telehealth: Payer: Self-pay | Admitting: Pharmacist

## 2021-06-01 ENCOUNTER — Ambulatory Visit (INDEPENDENT_AMBULATORY_CARE_PROVIDER_SITE_OTHER): Payer: Medicare Other | Admitting: Physician Assistant

## 2021-06-01 ENCOUNTER — Other Ambulatory Visit: Payer: Self-pay

## 2021-06-01 ENCOUNTER — Ambulatory Visit (INDEPENDENT_AMBULATORY_CARE_PROVIDER_SITE_OTHER)
Admission: RE | Admit: 2021-06-01 | Discharge: 2021-06-01 | Disposition: A | Discharge status: Home / Self Care | Payer: Medicare Other | Source: Ambulatory Visit | Attending: Physician Assistant | Admitting: Physician Assistant

## 2021-06-01 VITALS — BP 123/72 | HR 86 | Temp 97.8°F | Ht 59.0 in | Wt 160.2 lb

## 2021-06-01 DIAGNOSIS — I700 Atherosclerosis of aorta: Secondary | ICD-10-CM | POA: Diagnosis not present

## 2021-06-01 DIAGNOSIS — R058 Other specified cough: Secondary | ICD-10-CM

## 2021-06-01 DIAGNOSIS — I10 Essential (primary) hypertension: Secondary | ICD-10-CM | POA: Diagnosis not present

## 2021-06-01 DIAGNOSIS — J449 Chronic obstructive pulmonary disease, unspecified: Secondary | ICD-10-CM | POA: Diagnosis not present

## 2021-06-01 NOTE — Progress Notes (Signed)
Chronic Care Management Pharmacy Assistant   Name: [name] [name]  MRN: [id number] DOB: [birth year]   Reason for Encounter: Chart Review For Initial Visit With Clinical Pharmacist   Conditions to be addressed/monitored: HTN, PAD, A-fib, CHF, COPD, CKD, HLD  Primary concerns for visit include: HTN   Recent office visits:  06/01/2021 OV Allwardt, Randa Evens, PA-C; -She may continue Delsym as she has been doing well with this. Reminded her to keep resting and pushing fluids as well  02/24/2021 OV (PCP) Marin Olp, MD; no medication changes indicated.  Recent consult visits:  05/25/2021 OV (Vasc Surg) Marty Heck, MD; no medication changes indicated.  05/18/2021 OV (Phys Med) Izora Ribas, MD; no medication changes indicated.  05/03/2021 OV (Pulmonology) Martyn Ehrich, NP; no medication changes indicated.  04/29/2021 OV (Cardiology) Ann Maki, Lanice Schwab, NP;  Her Lasix was reduced from 40 mg daily to 20 mg daily in Sept 2022 due to elevated creatinine. Feels that leg edema has been stable since reduction in Lasix.   03/11/2021 OV (Phys Med) Izora Ribas, MD; no medication changes indicated.  03/03/2021 OV (Neurology) Pieter Partridge, DO; Rx prednisone taper  02/23/2021 OV (Allergy Asthma) Jiles Prows, MD;  Pantoprazole 40mg  - 1 tablet 2 times per day, Ipratropium 0.06% - 2 sprays each nostril 2 times per day  01/19/2021 OV (Pulmonology) Chesley Mires, MD; arrange for home sleep study, no medication changes indicated.  01/19/2021 OV (Phys Med) Izora Ribas, MD; Baclofen changed to 5 qhs, Discussed Meloxicam- she will clear with Dr Oval Linsey  12/30/2020 OV (Cardiology) Skeet Latch, MD; no medication changes indicated.  12/22/2020 OV (Orthopedics) Leandrew Koyanagi, MD; no medication changes indicated.  12/09/2020 OV (Phys Med) Izora Ribas, MD; Baclofen changed to 5 qhs  Hospital visits:  None in previous 6  months  Medications: Outpatient Encounter Medications as of 06/01/2021  Medication Sig   acetaminophen (TYLENOL) 325 MG tablet Take 1-2 tablets (325-650 mg total) by mouth every 4 (four) hours as needed for mild pain (or temp >/= 101 F).   apixaban (ELIQUIS) 5 MG TABS tablet Take 1 tablet by mouth twice daily   atorvastatin (LIPITOR) 40 MG tablet Take 1 tablet (40 mg total) by mouth daily.   baclofen (LIORESAL) 10 MG tablet TAKE 1/2 (ONE-HALF) TABLET BY MOUTH NIGHTLY   furosemide (LASIX) 40 MG tablet Take 40 mg by mouth as directed. TAKE 1/2 TABLET DAILY   ipratropium (ATROVENT) 0.06 % nasal spray Place 2 sprays into the nose 2 (two) times daily.   lisinopril (ZESTRIL) 5 MG tablet Take 1 tablet by mouth once daily   metoprolol succinate (TOPROL-XL) 25 MG 24 hr tablet Take 3 tablets by mouth once daily   Multiple Vitamin (MULTIVITAMIN WITH MINERALS) TABS tablet Take 1 tablet by mouth daily.   pantoprazole (PROTONIX) 40 MG tablet Take one tablet twice daily as directed   RESTASIS 0.05 % ophthalmic emulsion Place 1 drop into both eyes 2 (two) times daily as needed (dry eyes).    No facility-administered encounter medications on file as of 06/01/2021.   Current Medications: Metoprolol Succinate 25 mg last filled 05/20/2021 90 DS Baclofen 10 mg last filled 04/12/2021 60 DS Ipratropium 0.06% nasal spray Pantoprazole 40 mg last filled 05/30/2021 30 DS Furosemide 40 mg last filled 12/30/2020 90 DS Apixaban 5 mg last filled 04/13/2021 90 DS Lisinopril 5 mg last filled 05/16/2021 90 DS Atorvastatin 40 mg last filled 03/15/2021 90 DS Acetaminophen 325 mg  Multiple Vitamin with Minerals Restasis 0.05% ophthalmic emulsion  Patient Questions: Any changes in your medications or health? Patient states no recent changes other than recently having a "bad cold". She is currently taking Delsym for a her cough that is improving.  Any side effects from any medications?  Patient denies having any side  effects from any of her medications.  Do you have any symptoms or problems not managed by your medications? Patient denies having any symptoms or problems not currently managed by her medications.  Any concerns about your health right now? Patients denies having any concerns with her health.  Has your provider asked that you check blood pressure, blood sugar, or follow special diet at home? She checks her blood pressure. She does not check blood pressure or follow any special diet.  Do you get any type of exercise on a regular basis? Yes, she exercises daily. She stands up on her toes, does kicks from side to side and rides stationary bike 20 -30 minutes a day.  Can you think of a goal you would like to reach for your health? She would like to lose some weight.  Do you have any problems getting your medications? She states Eliquis is expensive. She has a low income.  Is there anything that you would like to discuss during the appointment?  She would like to discuss her medications.  Please bring medications and supplements to appointment   Care Gaps: Medicare Annual Wellness: Completed 01/21/2021 Hemoglobin A1C: 6.1% on 08/20/2020 Colonoscopy: Aged out Dexa Scan: Completed Mammogram: Aged out, last completed 05/04/2016  Future Appointments  Date Time Provider [place]  06/07/2021 11:00 AM [facility] CCM PHARMACIST [facility]  07/13/2021  1:00 PM Raulkar, Clide Deutscher, MD [facility] CPR  08/25/2021 11:20 AM Marin Olp, MD [facility]  09/07/2021 11:00 AM Raulkar, Clide Deutscher, MD [facility] CPR  10/06/2021 10:30 AM Pieter Partridge, DO [facility] None  11/09/2021  1:40 PM Raulkar, Clide Deutscher, MD [facility] CPR  12/28/2021 11:00 AM Ranell Patrick, Clide Deutscher, MD [facility] CPR  02/03/2022  1:00 PM [facility] HEALTH COACH [facility]  02/22/2022 11:00 AM Raulkar, Clide Deutscher, MD [facility] CPR  04/26/2022 11:00 AM Izora Ribas, MD [facility] CPR    Star Rating Drugs: Lisinopril 5 mg last filled  05/16/2021 90 DS Atorvastatin 40 mg last filled 03/15/2021 90 DS  April D Calhoun, [place] Pharmacist Assistant [phone]

## 2021-06-01 NOTE — Progress Notes (Signed)
Subjective:    Patient ID: [name] [name], female    DOB: Aug 13, 1924, 88 y.o.   MRN: [id number]  Chief Complaint  Patient presents with   Nasal Congestion   Cough    Symptoms started 1 week ago    HPI Patient is in today with her son, who drove to the appt, for the following:  Chief complaint: Lingering cold symptoms Symptom onset: 2 weeks ago Pertinent positives: Chest and sinus congestion, some generalized weakness  Pertinent negatives: Fever, chills, CP, SOB Treatments tried: Delsym has helped some Vaccine status: UTD on flu and COVID-19 vaccines  Sick exposure: Recent funeral with some URI symptoms in relatives Two home COVID-19 tests were negative   Past Medical History:  Diagnosis Date   Acute on chronic diastolic heart failure ([place]) 12/30/2020   Allergic rhinoconjunctivitis    Allergy    Anxiety    pt. managed- uses deep breathing    Arthritis    low back , stenosis   COLONIC POLYPS, RECURRENT 08/29/2006   2008 last colonoscopy. No further colonoscopy.      COPD (chronic obstructive pulmonary disease) (HCC)    Diverticulosis    Dysrhythmia    afib   Eczema    Environmental allergies    allergy shot- q friday in Dr. Janee Morn office. PFT's abnormal- recommended Spiriva to use preop & will d/c after surgery   GERD (gastroesophageal reflux disease)    Hiatal hernia    Hyperlipidemia    Hypertension    Lung nodule 2011   Paroxysmal atrial fibrillation ([place]) 11/27/2019   Peripheral vascular disease (HCC)    Shingles    Stenosis of popliteal artery (HCC)    blood clots in legs long ago     Trigeminal neuralgia    Left buttocks    Past Surgical History:  Procedure Laterality Date   ABDOMINAL AORTAGRAM N/A 06/17/2011   Procedure: ABDOMINAL Maxcine Ham;  Surgeon: Elam Dutch, MD;  Location: [facility] CATH LAB;  Service: Cardiovascular;  Laterality: N/A;   ABDOMINAL AORTOGRAM W/LOWER EXTREMITY Bilateral 06/22/2018   Procedure: ABDOMINAL AORTOGRAM W/LOWER EXTREMITY;   Surgeon: Elam Dutch, MD;  Location: [place] CV LAB;  Service: Cardiovascular;  Laterality: Bilateral;   ABDOMINAL AORTOGRAM W/LOWER EXTREMITY Bilateral 11/29/2019   Procedure: ABDOMINAL AORTOGRAM W/LOWER EXTREMITY;  Surgeon: Elam Dutch, MD;  Location: [place] [facility] LAB;  Service: Cardiovascular;  Laterality: Bilateral;   CATARACT EXTRACTION, BILATERAL     w IOL   CHOLECYSTECTOMY  1998   COLONOSCOPY     ENDARTERECTOMY FEMORAL Left 12/09/2019   Procedure: ENDARTERECTOMY FEMORAL with bovine patch angioplasty.;  Surgeon: Elam Dutch, MD;  Location: [facility] OR;  Service: Vascular;  Laterality: Left;   FEMORAL-POPLITEAL BYPASS GRAFT        x2 surgeries 1990's & 2009   FEMORAL-POPLITEAL BYPASS GRAFT Right 12/09/2019   Procedure: REDO RIGHT FEMORAL-POPLITEAL ARTERY BYPASS GRAFT;  Surgeon: Elam Dutch, MD;  Location: [facility] OR;  Service: Vascular;  Laterality: Right;   INJECTION KNEE Right Aug. 2016   Gel injection for pain   LUMBAR FUSION  07/06/2011   TONSILLECTOMY     as a teenager    TUBAL LIGATION      Family History  Problem Relation Age of Onset   Arthritis Mother    Hypertension Mother    Heart disease Father    Colon polyps Father    Breast cancer Sister    Breast cancer Daughter    Hypertension Son  Lung cancer Brother    Colon cancer Sister    Brain cancer Sister    Lung cancer Sister    Anesthesia problems Neg Hx    Hypotension Neg Hx    Malignant hyperthermia Neg Hx    Pseudochol deficiency Neg Hx     Social History   Tobacco Use   Smoking status: Former    Packs/day: 2.00    Years: 30.00    Pack years: 60.00    Types: Cigarettes    Quit date: 06/18/1993    Years since quitting: 27.9   Smokeless tobacco: Never   Tobacco comments:    QUIT IN 1995  Vaping Use   Vaping Use: Never used  Substance Use Topics   Alcohol use: No    Alcohol/week: 0.0 standard drinks   Drug use: No     Allergies  Allergen Reactions   Sulfa Antibiotics Other  (See Comments)    Cold sweat light headed and disorientation   Tiotropium Bromide Shortness Of Breath and Other (See Comments)    Sore throat also   Latex Itching and Rash   Ultram [Tramadol] Nausea And Vomiting   5-Alpha Reductase Inhibitors     Review of Systems NEGATIVE UNLESS OTHERWISE INDICATED IN HPI      Objective:     BP 123/72    Pulse 86    Temp 97.8 F (36.6 C)    Ht 4\' 11"  (1.499 m)    Wt 160 lb 3.2 oz (72.7 kg)    SpO2 95%    BMI 32.36 kg/m   Wt Readings from Last 3 Encounters:  06/01/21 160 lb 3.2 oz (72.7 kg)  05/25/21 162 lb (73.5 kg)  05/18/21 164 lb (74.4 kg)    BP Readings from Last 3 Encounters:  06/01/21 123/72  05/25/21 136/79  05/18/21 130/81     Physical Exam Vitals and nursing note reviewed.  Constitutional:      Appearance: Normal appearance. She is normal weight. She is not toxic-appearing.  HENT:     Head: Normocephalic and atraumatic.     Right Ear: Tympanic membrane, ear canal and external ear normal.     Left Ear: Tympanic membrane, ear canal and external ear normal.     Nose: Nose normal.     Mouth/Throat:     Mouth: Mucous membranes are moist.  Eyes:     Extraocular Movements: Extraocular movements intact.     Conjunctiva/sclera: Conjunctivae normal.     Pupils: Pupils are equal, round, and reactive to light.  Cardiovascular:     Rate and Rhythm: Normal rate and regular rhythm.     Pulses: Normal pulses.     Heart sounds: Normal heart sounds.  Pulmonary:     Effort: Pulmonary effort is normal.     Breath sounds: Normal breath sounds.     Comments: Some productive cough during exam Musculoskeletal:        General: Normal range of motion.     Cervical back: Normal range of motion and neck supple.  Skin:    General: Skin is warm and dry.  Neurological:     General: No focal deficit present.     Mental Status: She is alert and oriented to person, place, and time.  Psychiatric:        Mood and Affect: Mood normal.         Behavior: Behavior normal.        Thought Content: Thought content normal.  Judgment: Judgment normal.       Assessment & Plan:   Problem List Items Addressed This Visit   None Visit Diagnoses     Productive cough    -  Primary   Relevant Orders   DG Chest 2 View       1. Productive cough -Patient and son are most concerned about possible underlying PNA -Agreed to CXR at [facility], will treat pending xray report -She may continue Delsym as she has been doing well with this. Reminded her to keep resting and pushing fluids as well.    Eusebio Blazejewski M Digby Groeneveld, PA-C

## 2021-06-02 ENCOUNTER — Other Ambulatory Visit (HOSPITAL_BASED_OUTPATIENT_CLINIC_OR_DEPARTMENT_OTHER): Payer: Self-pay | Admitting: Cardiovascular Disease

## 2021-06-02 ENCOUNTER — Other Ambulatory Visit: Payer: Self-pay | Admitting: Physical Medicine and Rehabilitation

## 2021-06-04 NOTE — Telephone Encounter (Signed)
Rx(s) sent to pharmacy electronically.  

## 2021-06-04 NOTE — Progress Notes (Signed)
Chronic Care Management Pharmacy Note  06/08/2021 Name:  [name] [name] MRN:  [id number] DOB:  Apr 02, 1925  Summary: Initial visit with PharmD.  Patient doing well overall main concern is with the copay of Eliquis when she hits the donut hole.  Recommendations/Changes made from today's visit: Informed her to contact me this year when this happens.  Plan: FU 6 months CMA FU 3 months to assess adherence   Subjective: [name] [name] is an 88 y.o. year old female who is a primary patient of Hunter, Brayton Mars, MD.  The CCM team was consulted for assistance with disease management and care coordination needs.    Engaged with patient by telephone for initial visit in response to provider referral for pharmacy case management and/or care coordination services.   Consent to Services:  The patient was given the following information about Chronic Care Management services today, agreed to services, and gave verbal consent: 1. CCM service includes personalized support from designated clinical staff supervised by the primary care provider, including individualized plan of care and coordination with other care providers 2. 24/7 contact phone numbers for assistance for urgent and routine care needs. 3. Service will only be billed when office clinical staff spend 20 minutes or more in a month to coordinate care. 4. Only one practitioner may furnish and bill the service in a calendar month. 5.The patient may stop CCM services at any time (effective at the end of the month) by phone call to the office staff. 6. The patient will be responsible for cost sharing (co-pay) of up to 20% of the service fee (after annual deductible is met). Patient agreed to services and consent obtained.  Patient Care Team: Marin Olp, MD as PCP - General (Family Medicine) Skeet Latch, MD as PCP - Cardiology (Cardiology) Leandrew Koyanagi, MD as Attending Physician (Orthopedic Surgery) Jamse Arn, MD as  Consulting Physician (Physical Medicine and Rehabilitation) Warden Fillers, MD as Consulting Physician (Ophthalmology) Pieter Partridge, DO as Consulting Physician (Neurology) Edythe Clarity, [facility] as Pharmacist (Pharmacist)  Recent office visits:  06/01/2021 OV Allwardt, Randa Evens, PA-C; -She may continue Delsym as she has been doing well with this. Reminded her to keep resting and pushing fluids as well   02/24/2021 OV (PCP) Marin Olp, MD; no medication changes indicated.   Recent consult visits:  05/25/2021 OV (Vasc Surg) Marty Heck, MD; no medication changes indicated.   05/18/2021 OV (Phys Med) Izora Ribas, MD; no medication changes indicated.   05/03/2021 OV (Pulmonology) Martyn Ehrich, NP; no medication changes indicated.   04/29/2021 OV (Cardiology) Ann Maki, Lanice Schwab, NP;  Her Lasix was reduced from 40 mg daily to 20 mg daily in Sept 2022 due to elevated creatinine. Feels that leg edema has been stable since reduction in Lasix.    03/11/2021 OV (Phys Med) Izora Ribas, MD; no medication changes indicated.   03/03/2021 OV (Neurology) Pieter Partridge, DO; Rx prednisone taper   02/23/2021 OV (Allergy Asthma) Jiles Prows, MD;  Pantoprazole 75m - 1 tablet 2 times per day, Ipratropium 0.06% - 2 sprays each nostril 2 times per day   01/19/2021 OV (Pulmonology) SChesley Mires MD; arrange for home sleep study, no medication changes indicated.   01/19/2021 OV (Phys Med) RIzora Ribas MD; Baclofen changed to 5 qhs, Discussed Meloxicam- she will clear with Dr ROval Linsey  12/30/2020 OV (Cardiology) RSkeet Latch MD; no medication changes indicated.   12/22/2020 OV (Orthopedics) XErlinda Hong  Marylynn Pearson, MD; no medication changes indicated.   12/09/2020 OV (Phys Med) Izora Ribas, MD; Baclofen changed to 5 qhs   Hospital visits:  None in previous 6 months   Objective:  Lab Results  Component Value Date   CREATININE 1.08 (H) 05/06/2021    BUN 23 05/06/2021   GFR 40.56 (L) 08/20/2020   EGFR 48 (L) 05/06/2021   GFRNONAA 39 (L) 02/19/2020   GFRAA 46 (L) 02/19/2020   NA 146 (H) 05/06/2021   K 3.9 05/06/2021   CALCIUM 9.7 05/06/2021   CO2 27 05/06/2021   GLUCOSE 120 (H) 05/06/2021    Lab Results  Component Value Date/Time   HGBA1C 6.1 08/20/2020 11:59 AM   HGBA1C 5.6 02/19/2020 01:47 PM   GFR 40.56 (L) 08/20/2020 11:59 AM   GFR 46.42 (L) 02/28/2019 02:42 PM    Last diabetic Eye exam:  Lab Results  Component Value Date/Time   HMDIABEYEEXA normal 05/29/2008 12:00 AM    Last diabetic Foot exam: No results found for: HMDIABFOOTEX   Lab Results  Component Value Date   CHOL 134 01/08/2021   HDL 55 01/08/2021   LDLCALC 56 01/08/2021   LDLDIRECT 46.0 06/10/2019   TRIG 130 01/08/2021   CHOLHDL 2.4 01/08/2021    Hepatic Function Latest Ref Rng & Units 01/08/2021 08/20/2020 02/19/2020  Total Protein 6.0 - 8.5 g/dL 6.5 6.6 6.6  Albumin 3.5 - 4.6 g/dL 4.8(H) 4.4 -  AST 0 - 40 IU/L 21 18 18   ALT 0 - 32 IU/L 12 14 12   Alk Phosphatase 44 - 121 IU/L 94 103 -  Total Bilirubin 0.0 - 1.2 mg/dL 0.8 0.5 0.5  Bilirubin, Direct 0.0 - 0.3 mg/dL - - -    Lab Results  Component Value Date/Time   TSH 3.190 11/27/2019 10:26 AM   TSH 0.76 12/31/2012 02:19 PM   FREET4 1.36 11/27/2019 10:26 AM   FREET4 0.67 07/01/2013 02:26 PM    CBC Latest Ref Rng & Units 04/29/2021 08/20/2020 02/19/2020  WBC 3.4 - 10.8 x10E3/uL 9.8 8.2 8.3  Hemoglobin 11.1 - 15.9 g/dL 13.9 13.2 12.7  Hematocrit 34.0 - 46.6 % 41.4 39.1 39.6  Platelets 150 - 450 x10E3/uL 199 159.0 233    Lab Results  Component Value Date/Time   VD25OH 49 09/13/2010 11:50 AM    Clinical ASCVD: Yes  The ASCVD Risk score (Arnett DK, et al., 2019) failed to calculate for the following reasons:   The 2019 ASCVD risk score is only valid for ages 64 to 34    Depression screen PHQ 2/9 05/18/2021 03/11/2021 01/21/2021  Decreased Interest 0 0 0  Down, Depressed, Hopeless 0 0 0   PHQ - 2 Score 0 0 0  Some recent data might be hidden      Social History   Tobacco Use  Smoking Status Former   Packs/day: 2.00   Years: 30.00   Pack years: 60.00   Types: Cigarettes   Quit date: 06/18/1993   Years since quitting: 27.9  Smokeless Tobacco Never  Tobacco Comments   QUIT IN 1995   BP Readings from Last 3 Encounters:  06/01/21 123/72  05/25/21 136/79  05/18/21 130/81   Pulse Readings from Last 3 Encounters:  06/01/21 86  05/25/21 86  05/18/21 88   Wt Readings from Last 3 Encounters:  06/01/21 160 lb 3.2 oz (72.7 kg)  05/25/21 162 lb (73.5 kg)  05/18/21 164 lb (74.4 kg)   BMI Readings from Last 3 Encounters:  06/01/21  32.36 kg/m  05/25/21 32.72 kg/m  05/18/21 33.12 kg/m    Assessment/Interventions: Review of patient past medical history, allergies, medications, health status, including review of consultants reports, laboratory and other test data, was performed as part of comprehensive evaluation and provision of chronic care management services.   SDOH:  (Social Determinants of Health) assessments and interventions performed: Yes  Financial Resource Strain: Low Risk    Difficulty of Paying Living Expenses: Not hard at all   Food Insecurity: No Food Insecurity   Worried About Charity fundraiser in the Last Year: Never true   Arboriculturist in the Last Year: Never true    SDOH Screenings   Alcohol Screen: Not on file  Depression (PHQ2-9): Low Risk    PHQ-2 Score: 0  Financial Resource Strain: Low Risk    Difficulty of Paying Living Expenses: Not hard at all  Food Insecurity: No Food Insecurity   Worried About Charity fundraiser in the Last Year: Never true   Ran Out of Food in the Last Year: Never true  Housing: Low Risk    Last Housing Risk Score: 0  Physical Activity: Sufficiently Active   Days of Exercise per Week: 7 days   Minutes of Exercise per Session: 30 min  Social Connections: Moderately Integrated   Frequency of  Communication with Friends and Family: More than three times a week   Frequency of Social Gatherings with Friends and Family: Twice a week   Attends Religious Services: More than 4 times per year   Active Member of [employer] or Organizations: Yes   Attends Archivist Meetings: 1 to 4 times per year   Marital Status: Widowed  Stress: No Stress Concern Present   Feeling of Stress : Not at all  Tobacco Use: Medium Risk   Smoking Tobacco Use: Former   Smokeless Tobacco Use: Never   Passive Exposure: Not on file  Transportation Needs: No Transportation Needs   Lack of Transportation (Medical): No   Lack of Transportation (Non-Medical): No    CCM Care Plan  Allergies  Allergen Reactions   Sulfa Antibiotics Other (See Comments)    Cold sweat light headed and disorientation   Tiotropium Bromide Shortness Of Breath and Other (See Comments)    Sore throat also   Latex Itching and Rash   Ultram [Tramadol] Nausea And Vomiting   5-Alpha Reductase Inhibitors     Medications Reviewed Today     Reviewed by Edythe Clarity, [facility] (Pharmacist) on 06/08/21 at 2  Med List Status: <None>   Medication Order Taking? Sig Documenting Provider Last Dose Status Informant  acetaminophen (TYLENOL) 325 MG tablet [id number] Yes Take 1-2 tablets (325-650 mg total) by mouth every 4 (four) hours as needed for mild pain (or temp >/= 101 F). Cathlyn Parsons, PA-C Taking Active   apixaban (ELIQUIS) 5 MG TABS tablet [id number] Yes Take 1 tablet by mouth twice daily Skeet Latch, MD Taking Active   atorvastatin (LIPITOR) 40 MG tablet [id number] Yes Take 1 tablet (40 mg total) by mouth daily. Marin Olp, MD Taking Active   baclofen (LIORESAL) 10 MG tablet [id number] Yes TAKE 1/2 (ONE-HALF) TABLET BY MOUTH NIGHTLY Raulkar, Clide Deutscher, MD Taking Active   furosemide (LASIX) 20 MG tablet [id number] Yes Take 1 tablet (20 mg total) by mouth daily. Skeet Latch, MD Taking Active   ipratropium  (ATROVENT) 0.06 % nasal spray [id number]  Place 2 sprays into the nose 2 (two) times daily.  Kozlow, Donnamarie Poag, MD  Active   lisinopril (ZESTRIL) 5 MG tablet [id number] Yes Take 1 tablet by mouth once daily Skeet Latch, MD Taking Active   metoprolol succinate (TOPROL-XL) 25 MG 24 hr tablet [id number] Yes Take 3 tablets by mouth once daily Skeet Latch, MD Taking Active   Multiple Vitamin (MULTIVITAMIN WITH MINERALS) TABS tablet [id number] Yes Take 1 tablet by mouth daily. [provider] Taking Active Self  pantoprazole (PROTONIX) 40 MG tablet [id number] Yes Take one tablet twice daily as directed Kozlow, Donnamarie Poag, MD Taking Active   RESTASIS 0.05 % ophthalmic emulsion [id number] Yes Place 1 drop into both eyes 2 (two) times daily as needed (dry eyes).  [provider] Taking Active Self  Med List Note Annamaria Boots, Kasandra Knudsen, MD 08/05/10 2224): Allergy vaccine 1:10 [place]            Patient Active Problem List   Diagnosis Date Noted   Mild sleep apnea 05/03/2021   Chronic diastolic heart failure ([place]) 12/30/2020   Primary osteoarthritis of right knee 06/02/2020   Left groin hernia 05/22/2020   Sleep disturbance 01/09/2020   Acute lower UTI    Anxiety state    Peripheral edema    Leukocytosis    Debility 12/13/2019   Dyslipidemia    Rectal bleeding    Acute blood loss anemia    Ischemic colitis ([place])    Status post femoral-popliteal bypass surgery 12/09/2019   Persistent atrial fibrillation ([place]) 12/03/2019   Secondary hypercoagulable state ([place]) 12/03/2019   Chronic bilateral low back pain with right-sided sciatica 08/29/2019   Chronic pain of right knee 08/29/2019   Abnormality of gait 05/30/2019   Neuropathic pain 05/30/2019   Myalgia 08/08/2018   Chronic pain syndrome 06/27/2018   Gross hematuria 06/01/2018   Obesity (BMI 30.0-34.9) 11/14/2017   Failed back syndrome 09/28/2017   CKD (chronic kidney disease), stage III ([place]) 08/07/2017   Unilateral primary  osteoarthritis, right knee 04/07/2016   Acute gout of left foot 03/22/2016   Pain in right ankle and joints of right foot 08/28/2014   Trigeminal neuralgia    Encounter for therapeutic drug monitoring 10/21/2013   Syncope 12/29/2011   Chronic low back pain 09/13/2010   Allergic rhinitis due to pollen 06/25/2010   COPD mixed type ([place]) 01/22/2010   Solitary pulmonary nodule 01/12/2010   Insulin resistance 01/06/2009   Osteopenia 01/06/2009   Eczema 11/26/2007   Peripheral arterial disease ([place]) 06/29/2007   Hyperlipidemia 10/23/2006   Essential hypertension 10/23/2006   ESOPHAGEAL STRICTURE 08/09/1993    Immunization History  Administered Date(s) Administered   Fluad Quad(high Dose 65+) 01/14/2019, 02/12/2020, 01/21/2021   Influenza Split 12/25/2011   Influenza Whole 01/23/2010   Influenza,inj,Quad PF,6+ Mos 12/31/2012, 12/26/2014, 01/08/2016   Influenza-Unspecified 01/23/2014, 12/25/2017   PFIZER(Purple Top)SARS-COV-2 Vaccination 05/14/2019, 06/03/2019, 01/22/2020, 08/05/2020   PPD Test 06/24/2011   Pneumococcal Conjugate-13 08/31/2015   Pneumococcal Polysaccharide-23 04/25/2005   Td 04/25/2005   Tdap 01/14/2019   Zoster Recombinat (Shingrix) 04/20/2017, 06/29/2017   Zoster, Live 03/02/2010    Conditions to be addressed/monitored:  HTN, Afib, HF, COPD, Trigeminal neuralgia, Osteopenia, HLD, Anxiety  Care Plan : General Pharmacy (Adult)  Updates made by Edythe Clarity, RPH since 06/08/2021 12:00 AM     Problem: HTN, Afib, HF, HLD, Anxiety   Priority: High  Onset Date: 06/07/2021     Long-Range Goal: Patient-Specific Goal   Start Date: 06/07/2021  Expected End Date: 12/06/2021  This Visit's Progress: On track  Priority:  High  Note:   Current Barriers:  Unable to independently afford treatment regimen Unable to independently monitor therapeutic efficacy  Pharmacist Clinical Goal(s):  Patient will maintain control of BP and cholesterol as evidenced by labs   through collaboration with PharmD and provider.   Interventions: 1:1 collaboration with Marin Olp, MD regarding development and update of comprehensive plan of care as evidenced by provider attestation and co-signature Inter-disciplinary care team collaboration (see longitudinal plan of care) Comprehensive medication review performed; medication list updated in electronic medical record  Hypertension (BP goal <130/80) -Controlled -Current treatment: Lisinopril 5mg  daily Appropriate, Effective, Safe, Accessible Metoprolol XL 25mg  daily Appropriate, Effective, Safe, Accessible -Medications previously tried: Valsartan, HCTZ  -Current home readings: controlled, has not checked it as much -Current dietary habits: eats what taste good to her -Current exercise habits: She stands up on her toes, does kicks from side to side and rides stationary bike 20 -30 minutes a day. -Denies hypotensive/hypertensive symptoms -Educated on BP goals and benefits of medications for prevention of heart attack, stroke and kidney damage; Importance of home blood pressure monitoring; Symptoms of hypotension and importance of maintaining adequate hydration; -Counseled to monitor BP at home a few times per week, document, and provide log at future appointments -No changes needed at this time, continue active lifestyle  Hyperlipidemia: (LDL goal < 70) -Controlled -Current treatment: Atorvastatin 40mg  daily Appropriate, Effective, Safe, Accessible -Medications previously tried: none noted  -Current dietary patterns: see HTN -Current exercise habits: see HTN -Educated on Cholesterol goals;  Benefits of statin for ASCVD risk reduction; Importance of limiting foods high in cholesterol; -Recommended to continue current medication Most recent LDL is excellent, continue routine screenings  Atrial Fibrillation (Goal: prevent stroke and major bleeding) -Controlled -Current treatment: Rate control: Metoprolol  XL 25mg  three tablets daily Appropriate, Effective, Safe, Accessible Anticoagulation: Eliquis 5mg  BID Appropriate, Effective, Safe, Accessible -Medications previously tried: Warfarin -Home BP and HR readings: "Normal"  -Counseled on increased risk of stroke due to Afib and benefits of anticoagulation for stroke prevention; bleeding risk associated with Eliquis and importance of self-monitoring for signs/symptoms of bleeding; -Recommended to continue current medication Assessed patient finances. She does admit to increased copay recently with Eliquis.  Explained the donut hole and that first fill in 2023 should be back to more affordable price.  Did inform her to contact me this year when donut hole hits so we can apply for patient assistance, patient agrees.  Heart Failure (Goal: manage symptoms and prevent exacerbations) -Controlled -Last ejection fraction: 60-65%  -HF type: Diastolic -Current treatment: Furosemide 20mg  daily Appropriate, Effective, Safe, Accessible Metoprolol XL 25mg  three tablets daily Appropriate, Effective, Safe, Accessible -Medications previously tried: none noted  -Current home BP/HR readings: normal -Current dietary habits: eats pretty much whatever tastes good to her at the moment -Current exercise habits: see above -Educated on Benefits of medications for managing symptoms and prolonging life Importance of weighing daily; if you gain more than 3 pounds in one day or 5 pounds in one week, contact providers -Recommended to continue current medication Continue to monitor fluid status - euvolemic currently per her reports.   Patient Goals/Self-Care Activities Patient will:  - take medications as prescribed as evidenced by patient report and record review check blood pressure periodically, document, and provide at future appointments  Follow Up Plan: The care management team will reach out to the patient again over the next 180 days.        Medication  Assistance:  Will need assistance  with Eliquis when donut hole hits.  Compliance/Adherence/Medication fill history: Care Gaps: None at this time  Star-Rating Drugs: Atorvastatin 93m 03/15/21 90ds  Patient's [place] pharmacy is:  [place] [id number]: [phone]: [id number]: [phone]: [phone] Uses pill box? Yes Pt endorses 100% compliance  We discussed: Benefits of medication synchronization, packaging and delivery as well as enhanced pharmacist oversight with [employer]. Patient decided to: Continue current medication management strategy  Care Plan and Follow Up Patient Decision:  Patient agrees to Care Plan and Follow-up.  Plan: The care management team will reach out to the patient again over the next 180 days.  CBeverly Milch PharmD Clinical Pharmacist  [phone]

## 2021-06-07 ENCOUNTER — Ambulatory Visit (INDEPENDENT_AMBULATORY_CARE_PROVIDER_SITE_OTHER): Payer: Medicare Other

## 2021-06-07 DIAGNOSIS — E785 Hyperlipidemia, unspecified: Secondary | ICD-10-CM

## 2021-06-07 DIAGNOSIS — I4819 Other persistent atrial fibrillation: Secondary | ICD-10-CM

## 2021-06-07 DIAGNOSIS — I5032 Chronic diastolic (congestive) heart failure: Secondary | ICD-10-CM

## 2021-06-07 DIAGNOSIS — I10 Essential (primary) hypertension: Secondary | ICD-10-CM

## 2021-06-08 NOTE — Patient Instructions (Addendum)
Visit Information   Goals Addressed             This Visit's Progress    Track and Manage Fluids and Swelling-Heart Failure       Timeframe:  Long-Range Goal Priority:  High Start Date:  06/07/21                           Expected End Date:      12/05/21                 Follow Up Date 09/04/21    - call office if I gain more than 2 pounds in one day or 5 pounds in one week - watch for swelling in feet, ankles and legs every day    Why is this important?   It is important to check your weight daily and watch how much salt and liquids you have.  It will help you to manage your heart failure.    Notes: Continue to monitor weight!       Patient Care Plan: General Pharmacy (Adult)     Problem Identified: HTN, Afib, HF, HLD, Anxiety   Priority: High  Onset Date: 06/07/2021     Long-Range Goal: Patient-Specific Goal   Start Date: 06/07/2021  Expected End Date: 12/06/2021  This Visit's Progress: On track  Priority: High  Note:   Current Barriers:  Unable to independently afford treatment regimen Unable to independently monitor therapeutic efficacy  Pharmacist Clinical Goal(s):  Patient will maintain control of BP and cholesterol as evidenced by labs  through collaboration with PharmD and provider.   Interventions: 1:1 collaboration with Marin Olp, MD regarding development and update of comprehensive plan of care as evidenced by provider attestation and co-signature Inter-disciplinary care team collaboration (see longitudinal plan of care) Comprehensive medication review performed; medication list updated in electronic medical record  Hypertension (BP goal <130/80) -Controlled -Current treatment: Lisinopril 5mg  daily Appropriate, Effective, Safe, Accessible Metoprolol XL 25mg  daily Appropriate, Effective, Safe, Accessible -Medications previously tried: Valsartan, HCTZ  -Current home readings: controlled, has not checked it as much -Current dietary habits:  eats what taste good to her -Current exercise habits: She stands up on her toes, does kicks from side to side and rides stationary bike 20 -30 minutes a day. -Denies hypotensive/hypertensive symptoms -Educated on BP goals and benefits of medications for prevention of heart attack, stroke and kidney damage; Importance of home blood pressure monitoring; Symptoms of hypotension and importance of maintaining adequate hydration; -Counseled to monitor BP at home a few times per week, document, and provide log at future appointments -No changes needed at this time, continue active lifestyle  Hyperlipidemia: (LDL goal < 70) -Controlled -Current treatment: Atorvastatin 40mg  daily Appropriate, Effective, Safe, Accessible -Medications previously tried: none noted  -Current dietary patterns: see HTN -Current exercise habits: see HTN -Educated on Cholesterol goals;  Benefits of statin for ASCVD risk reduction; Importance of limiting foods high in cholesterol; -Recommended to continue current medication Most recent LDL is excellent, continue routine screenings  Atrial Fibrillation (Goal: prevent stroke and major bleeding) -Controlled -Current treatment: Rate control: Metoprolol XL 25mg  three tablets daily Appropriate, Effective, Safe, Accessible Anticoagulation: Eliquis 5mg  BID Appropriate, Effective, Safe, Accessible -Medications previously tried: Warfarin -Home BP and HR readings: "Normal"  -Counseled on increased risk of stroke due to Afib and benefits of anticoagulation for stroke prevention; bleeding risk associated with Eliquis and importance of self-monitoring for signs/symptoms of bleeding; -Recommended to  continue current medication Assessed patient finances. She does admit to increased copay recently with Eliquis.  Explained the donut hole and that first fill in 2023 should be back to more affordable price.  Did inform her to contact me this year when donut hole hits so we can apply for  patient assistance, patient agrees.  Heart Failure (Goal: manage symptoms and prevent exacerbations) -Controlled -Last ejection fraction: 60-65%  -HF type: Diastolic -Current treatment: Furosemide 20mg  daily Appropriate, Effective, Safe, Accessible Metoprolol XL 25mg  three tablets daily Appropriate, Effective, Safe, Accessible -Medications previously tried: none noted  -Current home BP/HR readings: normal -Current dietary habits: eats pretty much whatever tastes good to her at the moment -Current exercise habits: see above -Educated on Benefits of medications for managing symptoms and prolonging life Importance of weighing daily; if you gain more than 3 pounds in one day or 5 pounds in one week, contact providers -Recommended to continue current medication Continue to monitor fluid status - euvolemic currently per her reports.   Patient Goals/Self-Care Activities Patient will:  - take medications as prescribed as evidenced by patient report and record review check blood pressure periodically, document, and provide at future appointments  Follow Up Plan: The care management team will reach out to the patient again over the next 180 days.       Ms. Angelica was given information about Chronic Care Management services today including:  CCM service includes personalized support from designated clinical staff supervised by her physician, including individualized plan of care and coordination with other care providers 24/7 contact phone numbers for assistance for urgent and routine care needs. Standard insurance, coinsurance, copays and deductibles apply for chronic care management only during months in which we provide at least 20 minutes of these services. Most insurances cover these services at 100%, however patients may be responsible for any copay, coinsurance and/or deductible if applicable. This service may help you avoid the need for more expensive face-to-face services. Only one  practitioner may furnish and bill the service in a calendar month. The patient may stop CCM services at any time (effective at the end of the month) by phone call to the office staff.  Patient agreed to services and verbal consent obtained.   The patient verbalized understanding of instructions, educational materials, and care plan provided today and agreed to receive a mailed copy of patient instructions, educational materials, and care plan.  Telephone follow up appointment with pharmacy team member scheduled for: 6 months  Edythe Clarity, [place] Aguza, PharmD Clinical Pharmacist  [facility] [phone]

## 2021-06-11 ENCOUNTER — Telehealth: Payer: Self-pay | Admitting: Orthopaedic Surgery

## 2021-06-11 NOTE — Telephone Encounter (Signed)
Patient called. She would like gel injections in her knee. Her call back number is [phone]

## 2021-06-11 NOTE — Telephone Encounter (Signed)
Noted  

## 2021-06-17 ENCOUNTER — Telehealth: Payer: Self-pay

## 2021-06-17 NOTE — Telephone Encounter (Signed)
VOB submitted for SynviscOne, right knee. BV pending.  

## 2021-06-22 DIAGNOSIS — I5030 Unspecified diastolic (congestive) heart failure: Secondary | ICD-10-CM

## 2021-06-22 DIAGNOSIS — I119 Hypertensive heart disease without heart failure: Secondary | ICD-10-CM

## 2021-06-22 DIAGNOSIS — E785 Hyperlipidemia, unspecified: Secondary | ICD-10-CM | POA: Diagnosis not present

## 2021-06-22 DIAGNOSIS — I4891 Unspecified atrial fibrillation: Secondary | ICD-10-CM

## 2021-06-29 ENCOUNTER — Telehealth: Payer: Self-pay

## 2021-06-29 NOTE — Telephone Encounter (Signed)
Approved for SynviscOne, right knee. ?[place] ?Medicare deductible has been met ?Covered at 100% through [facility] after Medicare pays. ?No Co-pay ?No PA required ? ?Appt. 07/08/2021 with Dr. [name] ?

## 2021-07-05 NOTE — Progress Notes (Incomplete)
Phone [phone] In person visit   Subjective:   [name] [name] is a 88 y.o. year old very pleasant female patient who presents for/with See problem oriented charting No chief complaint on file.   This visit occurred during the SARS-CoV-2 public health emergency.  Safety protocols were in place, including screening questions prior to the visit, additional usage of staff PPE, and extensive cleaning of exam room while observing appropriate contact time as indicated for disinfecting solutions.   Past Medical History-  Patient Active Problem List   Diagnosis Date Noted   Mild sleep apnea 05/03/2021   Chronic diastolic heart failure ([place]) 12/30/2020   Primary osteoarthritis of right knee 06/02/2020   Left groin hernia 05/22/2020   Sleep disturbance 01/09/2020   Acute lower UTI    Anxiety state    Peripheral edema    Leukocytosis    Debility 12/13/2019   Dyslipidemia    Rectal bleeding    Acute blood loss anemia    Ischemic colitis ([place])    Status post femoral-popliteal bypass surgery 12/09/2019   Persistent atrial fibrillation ([place]) 12/03/2019   Secondary hypercoagulable state ([place]) 12/03/2019   Chronic bilateral low back pain with right-sided sciatica 08/29/2019   Chronic pain of right knee 08/29/2019   Abnormality of gait 05/30/2019   Neuropathic pain 05/30/2019   Myalgia 08/08/2018   Chronic pain syndrome 06/27/2018   Gross hematuria 06/01/2018   Obesity (BMI 30.0-34.9) 11/14/2017   Failed back syndrome 09/28/2017   CKD (chronic kidney disease), stage III ([place]) 08/07/2017   Unilateral primary osteoarthritis, right knee 04/07/2016   Acute gout of left foot 03/22/2016   Pain in right ankle and joints of right foot 08/28/2014   Trigeminal neuralgia    Encounter for therapeutic drug monitoring 10/21/2013   Syncope 12/29/2011   Chronic low back pain 09/13/2010   Allergic rhinitis due to pollen 06/25/2010   COPD mixed type ([place]) 01/22/2010   Solitary pulmonary nodule  01/12/2010   Insulin resistance 01/06/2009   Osteopenia 01/06/2009   Eczema 11/26/2007   Peripheral arterial disease ([place]) 06/29/2007   Hyperlipidemia 10/23/2006   Essential hypertension 10/23/2006   ESOPHAGEAL STRICTURE 08/09/1993    Medications- reviewed and updated Current Outpatient Medications  Medication Sig Dispense Refill   acetaminophen (TYLENOL) 325 MG tablet Take 1-2 tablets (325-650 mg total) by mouth every 4 (four) hours as needed for mild pain (or temp >/= 101 F).     apixaban (ELIQUIS) 5 MG TABS tablet Take 1 tablet by mouth twice daily 180 tablet 1   atorvastatin (LIPITOR) 40 MG tablet Take 1 tablet (40 mg total) by mouth daily. 90 tablet 3   baclofen (LIORESAL) 10 MG tablet TAKE 1/2 (ONE-HALF) TABLET BY MOUTH NIGHTLY 30 tablet 0   furosemide (LASIX) 20 MG tablet Take 1 tablet (20 mg total) by mouth daily. 90 tablet 2   ipratropium (ATROVENT) 0.06 % nasal spray Place 2 sprays into the nose 2 (two) times daily. 15 mL 11   lisinopril (ZESTRIL) 5 MG tablet Take 1 tablet by mouth once daily 90 tablet 3   metoprolol succinate (TOPROL-XL) 25 MG 24 hr tablet Take 3 tablets by mouth once daily 270 tablet 1   Multiple Vitamin (MULTIVITAMIN WITH MINERALS) TABS tablet Take 1 tablet by mouth daily.     pantoprazole (PROTONIX) 40 MG tablet Take one tablet twice daily as directed 60 tablet 11   RESTASIS 0.05 % ophthalmic emulsion Place 1 drop into both eyes 2 (two) times daily  as needed (dry eyes).   99   No current facility-administered medications for this visit.     Objective:  There were no vitals taken for this visit. Gen: NAD, resting comfortably CV: RRR no murmurs rubs or gallops Lungs: CTAB no crackles, wheeze, rhonchi Abdomen: soft/nontender/nondistended/normal bowel sounds. No rebound or guarding.  Ext: no edema Skin: warm, dry Neuro: grossly normal, moves all extremities  ***    Assessment and Plan   awv 01/16/20 *** rn  Other notes: 1. Eye watering better on  restasis 2. Dr. Neldon Mc started her on protonix '40mg'$ - also can use pepcid but hasnt had to 3. for chronic back pain- PM&R- getting shots in her back that are working better than cymbalta was.  4. On lamictal for trigeminal neuralgia 5. Walking with a cane, driving on her own again  #PAD-actually on warfarin for arterial thrombosis in the past now on Eliquis for A. fib as well. Dr. Oneida Alar ok with compression stockings #Hyperlipidemia  S: Compliant with atorvastatin 40 mg  -simvastatin at 40 mg in the past  - LDL goal 70 or less due to arterial disease. Lab Results  Component Value Date   CHOL 134 01/08/2021   HDL 55 01/08/2021   LDLCALC 56 01/08/2021   LDLDIRECT 46.0 06/10/2019   TRIG 130 01/08/2021   CHOLHDL 2.4 01/08/2021   A/P: ***  # Atrial fibrillation S: Rate controlled with metoprolol 25 mg mg extended release Anticoagulated with Eliquis 5 mg twice daily- reevaluate dose if cr raises above 1.5   A/P: ***   #Essential hypertension S: medication: Lisinopril 5 mg, metoprolol 75 mg XR and lasix 20 mg daily prn -prior on irbesartan 30 mg and hydrochlorothiazide 25 mg but stopped after august 2021 hospitalization.  Home readings #s: *** BP Readings from Last 3 Encounters:  06/01/21 123/72  05/25/21 136/79  05/18/21 130/81  A/P: ***  #Chronic kidney disease stage III S: Patient's GFR has been stable in the 50s ***. She knows to avoid NSAIDs A/P:***   #Insulin resistance/obesity S:  A/P: ***  # some incontinence and urinary frequency. wears pads as of at least 2021. doing kegel exercise. check UA with labs    From: Orbie Hurst Sent: 08/13/2018 11:41 AM EDT To: Marin Olp, MD  Her past vascular history is significant for the fact that she underwent thrombolysis for a right femoral to below-knee popliteal bypass graft in June 2015. There was no significant stenosis noted after the thrombolysis. She was placed on Coumadin at discharge.    I think you are  correct the diagnosis of DVT should be removed without evidence and the Coumadin was started for arterial thrombosis not DVT.  Thank you for your time.  Roxy Horseman, vascular surgery PA-C ***  Recommended follow up: No follow-ups on file. Future Appointments  Date Time Provider [place]  07/08/2021  8:15 AM Leandrew Koyanagi, MD [facility] None  07/13/2021  1:00 PM Raulkar, Clide Deutscher, MD [facility] CPR  08/25/2021 11:20 AM Marin Olp, MD [facility] PEC  09/07/2021 11:00 AM Raulkar, Clide Deutscher, MD [facility] CPR  10/06/2021 10:30 AM Pieter Partridge, DO [facility] None  10/08/2021 10:00 AM Skeet Latch, MD [facility]  11/09/2021  1:40 PM Raulkar, Clide Deutscher, MD [facility] CPR  12/07/2021  2:15 PM [facility]CM PHARMACIST [facility] PEC  12/28/2021 11:00 AM Raulkar, Clide Deutscher, MD [facility] CPR  02/03/2022  1:00 PM [facility] HEALTH COACH [facility]EC  02/22/2022 11:00 AM Ranell Patrick, Martha Clan  P, MD [facility] CPR  04/26/2022 11:00 AM Raulkar, Clide Deutscher, MD [facility] CPR    Lab/Order associations: No diagnosis found.  No orders of the defined types were placed in this encounter.   Return precautions advised.  Burnett Corrente

## 2021-07-08 ENCOUNTER — Other Ambulatory Visit: Payer: Self-pay

## 2021-07-08 ENCOUNTER — Encounter: Payer: Self-pay | Admitting: Orthopaedic Surgery

## 2021-07-08 ENCOUNTER — Ambulatory Visit (INDEPENDENT_AMBULATORY_CARE_PROVIDER_SITE_OTHER): Payer: Medicare Other | Admitting: Orthopaedic Surgery

## 2021-07-08 DIAGNOSIS — M1711 Unilateral primary osteoarthritis, right knee: Secondary | ICD-10-CM | POA: Diagnosis not present

## 2021-07-08 MED ORDER — HYLAN G-F 20 48 MG/6ML IX SOSY
48.0000 mg | PREFILLED_SYRINGE | INTRA_ARTICULAR | Status: AC | PRN
  Administered 2021-07-08: 48 mg via INTRA_ARTICULAR

## 2021-07-08 NOTE — Progress Notes (Signed)
? ?Office Visit Note ?  ?Patient: [name] [name]           ?Date of Birth: Aug 29, 1924           ?MRN: [id number] ?Visit Date: 07/08/2021 ?             ?Requested by: Marin Olp, MD ?[address] ?[place],  [place] [id number] ?PCP: Marin Olp, MD ? ? ?Assessment & Plan: ?Visit Diagnoses:  ?1. Primary osteoarthritis of right knee   ? ? ?Plan: [name] [name] is here for right knee synvisc injection.  Tolerated injection well.   ? ?Follow-Up Instructions: No follow-ups on file.  ? ?Orders:  ?No orders of the defined types were placed in this encounter. ? ?No orders of the defined types were placed in this encounter. ? ? ? ? Procedures: ?Large Joint Inj: R knee on 07/08/2021 8:44 AM ?Indications: pain ?Details: 22 G needle ? ?Arthrogram: No ? ?Medications: 48 mg Hylan 48 MG/6ML ?Outcome: tolerated well, no immediate complications ?Patient was prepped and draped in the usual sterile fashion.  ? ? ? ? ?Clinical Data: ?No additional findings. ? ? ?Subjective: ?Chief Complaint  ?Patient presents with  ? Right Knee - Follow-up  ?  Synvisc One  ? ? ?HPI ? ?Review of Systems ? ? ?Objective: ?Vital Signs: There were no vitals taken for this visit. ? ?Physical Exam ? ?Ortho Exam ? ?Specialty Comments:  ?No specialty comments available. ? ?Imaging: ?No results found. ? ? ?PMFS History: ?Patient Active Problem List  ? Diagnosis Date Noted  ? Mild sleep apnea 05/03/2021  ? Chronic diastolic heart failure ([place]) 12/30/2020  ? Primary osteoarthritis of right knee 06/02/2020  ? Left groin hernia 05/22/2020  ? Sleep disturbance 01/09/2020  ? Acute lower UTI   ? Anxiety state   ? Peripheral edema   ? Leukocytosis   ? Debility 12/13/2019  ? Dyslipidemia   ? Rectal bleeding   ? Acute blood loss anemia   ? Ischemic colitis ([place])   ? Status post femoral-popliteal bypass surgery 12/09/2019  ? Persistent atrial fibrillation ([place]) 12/03/2019  ? Secondary hypercoagulable state ([place]) 12/03/2019  ? Chronic bilateral low back pain with  right-sided sciatica 08/29/2019  ? Chronic pain of right knee 08/29/2019  ? Abnormality of gait 05/30/2019  ? Neuropathic pain 05/30/2019  ? Myalgia 08/08/2018  ? Chronic pain syndrome 06/27/2018  ? Gross hematuria 06/01/2018  ? Obesity (BMI 30.0-34.9) 11/14/2017  ? Failed back syndrome 09/28/2017  ? CKD (chronic kidney disease), stage III ([place]) 08/07/2017  ? Unilateral primary osteoarthritis, right knee 04/07/2016  ? Acute gout of left foot 03/22/2016  ? Pain in right ankle and joints of right foot 08/28/2014  ? Trigeminal neuralgia   ? Encounter for therapeutic drug monitoring 10/21/2013  ? Syncope 12/29/2011  ? Chronic low back pain 09/13/2010  ? Allergic rhinitis due to pollen 06/25/2010  ? COPD mixed type ([place]) 01/22/2010  ? Solitary pulmonary nodule 01/12/2010  ? Insulin resistance 01/06/2009  ? Osteopenia 01/06/2009  ? Eczema 11/26/2007  ? Peripheral arterial disease ([place]) 06/29/2007  ? Hyperlipidemia 10/23/2006  ? Essential hypertension 10/23/2006  ? ESOPHAGEAL STRICTURE 08/09/1993  ? ?Past Medical History:  ?Diagnosis Date  ? Acute on chronic diastolic heart failure ([place]) 12/30/2020  ? Allergic rhinoconjunctivitis   ? Allergy   ? Anxiety   ? pt. managed- uses deep breathing   ? Arthritis   ? low back , stenosis  ? COLONIC POLYPS, RECURRENT 08/29/2006  ?  2008 last colonoscopy. No further colonoscopy.     ? COPD (chronic obstructive pulmonary disease) ([place])   ? Diverticulosis   ? Dysrhythmia   ? afib  ? Eczema   ? Environmental allergies   ? allergy shot- q friday in Dr. Janee Morn office. PFT's abnormal- recommended Spiriva to use preop & will d/c after surgery  ? GERD (gastroesophageal reflux disease)   ? Hiatal hernia   ? Hyperlipidemia   ? Hypertension   ? Lung nodule 2011  ? Paroxysmal atrial fibrillation ([place]) 11/27/2019  ? Peripheral vascular disease ([place])   ? Shingles   ? Stenosis of popliteal artery ([facility])   ? blood clots in legs long ago    ? Trigeminal neuralgia   ? Left buttocks  ?  ?Family History   ?Problem Relation Age of Onset  ? Arthritis Mother   ? Hypertension Mother   ? Heart disease Father   ? Colon polyps Father   ? Breast cancer Sister   ? Breast cancer Daughter   ? Hypertension Son   ? Lung cancer Brother   ? Colon cancer Sister   ? Brain cancer Sister   ? Lung cancer Sister   ? Anesthesia problems Neg Hx   ? Hypotension Neg Hx   ? Malignant hyperthermia Neg Hx   ? Pseudochol deficiency Neg Hx   ?  ?Past Surgical History:  ?Procedure Laterality Date  ? ABDOMINAL AORTAGRAM N/A 06/17/2011  ? Procedure: ABDOMINAL AORTAGRAM;  Surgeon: Elam Dutch, MD;  Location[facility] CATH LAB;  Service: Cardiovascular;  Laterality: N/A;  ? ABDOMINAL AORTOGRAM W/LOWER EXTREMITY Bilateral 06/22/2018  ? Procedure: ABDOMINAL AORTOGRAM W/LOWER EXTREMITY;  Surgeon: Elam Dutch, MD;  Location: [facility] CV LAB;  Service: Cardiovascular;  Laterality: Bilateral;  ? ABDOMINAL AORTOGRAM W/LOWER EXTREMITY Bilateral 11/29/2019  ? Procedure: ABDOMINAL AORTOGRAM W/LOWER EXTREMITY;  Surgeon: Elam Dutch, MD;  Location: [facility] CV LAB;  Service: Cardiovascular;  Laterality: Bilateral;  ? CATARACT EXTRACTION, BILATERAL    ? w IOL  ? CHOLECYSTECTOMY  1998  ? COLONOSCOPY    ? ENDARTERECTOMY FEMORAL Left 12/09/2019  ? Procedure: ENDARTERECTOMY FEMORAL with bovine patch angioplasty.;  Surgeon: Elam Dutch, MD;  Location: [facility] OR;  Service: Vascular;  Laterality: Left;  ? FEMORAL-POPLITEAL BYPASS GRAFT    ?    x2 surgeries 1990's & 2009  ? FEMORAL-POPLITEAL BYPASS GRAFT Right 12/09/2019  ? Procedure: REDO RIGHT FEMORAL-POPLITEAL ARTERY BYPASS GRAFT;  Surgeon: Elam Dutch, MD;  Location: [facility];  Service: Vascular;  Laterality: Right;  ? INJECTION KNEE Right Aug. 2016  ? Gel injection for pain  ? LUMBAR FUSION  07/06/2011  ? TONSILLECTOMY    ? as a teenager   ? TUBAL LIGATION    ? ?Social History  ? ?Occupational History  ? Occupation: retired  ?  Employer: RETIRED  ?Tobacco Use  ? Smoking status: Former  ?  Packs/day:  2.00  ?  Years: 30.00  ?  Pack years: 60.00  ?  Types: Cigarettes  ?  Quit date: 06/18/1993  ?  Years since quitting: 28.0  ? Smokeless tobacco: Never  ? Tobacco comments:  ?  [place]  ?Vaping Use  ? Vaping Use: Never used  ?Substance and Sexual Activity  ? Alcohol use: No  ?  Alcohol/week: 0.0 standard drinks  ? Drug use: No  ? Sexual activity: Not on file  ? ? ? ? ? ? ?

## 2021-07-13 ENCOUNTER — Encounter
Payer: Medicare Other | Attending: Physical Medicine and Rehabilitation | Admitting: Physical Medicine and Rehabilitation

## 2021-07-13 ENCOUNTER — Other Ambulatory Visit: Payer: Self-pay

## 2021-07-13 ENCOUNTER — Encounter: Payer: Self-pay | Admitting: Physical Medicine and Rehabilitation

## 2021-07-13 VITALS — BP 127/74 | HR 91 | Ht 59.0 in | Wt 165.0 lb

## 2021-07-13 DIAGNOSIS — M791 Myalgia, unspecified site: Secondary | ICD-10-CM | POA: Diagnosis not present

## 2021-07-13 MED ORDER — LIDOCAINE 5 % EX PTCH: 1.0000 | MEDICATED_PATCH | CUTANEOUS | 3 refills | Status: AC

## 2021-07-13 NOTE — Progress Notes (Signed)
Trigger Point Injection  Indication: Lumbar myofascial pain not relieved by medication management and other conservative care.  Informed consent was obtained after describing risk and benefits of the procedure with the patient, this includes bleeding, bruising, infection and medication side effects.  The patient wishes to proceed and has given written consent.  The patient was placed in a seated position.  The area of pain was marked and prepped with Betadine.  It was entered with a 25-gauge 1/2 inch needle and a total of 5 mL of 1% lidocaine and normal saline was injected into a total of 4 trigger points, after negative draw back for blood.  The patient tolerated the procedure well.  Post procedure instructions were given.     

## 2021-07-14 ENCOUNTER — Telehealth: Payer: Self-pay

## 2021-07-14 NOTE — Telephone Encounter (Signed)
Approved 07/14/2021-07/16/2022 ?

## 2021-07-14 NOTE — Telephone Encounter (Signed)
PA submitted for Lidocaine patches 

## 2021-07-15 ENCOUNTER — Other Ambulatory Visit: Payer: Self-pay | Admitting: Cardiovascular Disease

## 2021-07-15 NOTE — Telephone Encounter (Signed)
Prescription refill request for Eliquis received. ?Indication:Afib ?Last office visit:1/23 ?Scr:1.0 ?Age: 88 ?Weight:74.8 kg ? ?Prescription refilled ? ?

## 2021-08-05 ENCOUNTER — Other Ambulatory Visit: Payer: Self-pay | Admitting: Physical Medicine and Rehabilitation

## 2021-08-25 ENCOUNTER — Ambulatory Visit (INDEPENDENT_AMBULATORY_CARE_PROVIDER_SITE_OTHER): Payer: Medicare Other | Admitting: Family Medicine

## 2021-08-25 ENCOUNTER — Encounter: Payer: Self-pay | Admitting: Family Medicine

## 2021-08-25 VITALS — BP 128/82 | HR 96 | Temp 97.9°F | Ht 59.0 in | Wt 167.4 lb

## 2021-08-25 DIAGNOSIS — I739 Peripheral vascular disease, unspecified: Secondary | ICD-10-CM

## 2021-08-25 DIAGNOSIS — D6869 Other thrombophilia: Secondary | ICD-10-CM | POA: Diagnosis not present

## 2021-08-25 DIAGNOSIS — I5032 Chronic diastolic (congestive) heart failure: Secondary | ICD-10-CM

## 2021-08-25 DIAGNOSIS — J449 Chronic obstructive pulmonary disease, unspecified: Secondary | ICD-10-CM | POA: Diagnosis not present

## 2021-08-25 DIAGNOSIS — N183 Chronic kidney disease, stage 3 unspecified: Secondary | ICD-10-CM | POA: Diagnosis not present

## 2021-08-25 DIAGNOSIS — I4819 Other persistent atrial fibrillation: Secondary | ICD-10-CM

## 2021-08-25 DIAGNOSIS — E785 Hyperlipidemia, unspecified: Secondary | ICD-10-CM | POA: Diagnosis not present

## 2021-08-25 DIAGNOSIS — I10 Essential (primary) hypertension: Secondary | ICD-10-CM

## 2021-08-25 NOTE — Patient Instructions (Addendum)
Trial voltaren gel for fingers (do not rub your eyes if you have this on) ? ?You wanted to do full bloodwork last visit- since had some in January im ok with that ? ?Always so good to see you  ? ?Recommended follow up: Return in about 6 months (around 02/25/2022) for followup or sooner if needed.Schedule b4 you leave. ? ? ?

## 2021-09-07 ENCOUNTER — Encounter
Payer: Medicare Other | Attending: Physical Medicine and Rehabilitation | Admitting: Physical Medicine and Rehabilitation

## 2021-09-07 ENCOUNTER — Encounter: Payer: Self-pay | Admitting: Physical Medicine and Rehabilitation

## 2021-09-07 VITALS — BP 120/83 | HR 80 | Ht 59.0 in | Wt 167.6 lb

## 2021-09-07 DIAGNOSIS — M791 Myalgia, unspecified site: Secondary | ICD-10-CM | POA: Insufficient documentation

## 2021-09-07 NOTE — Progress Notes (Signed)
Trigger Point Injection  Indication: Lumbar myofascial pain not relieved by medication management and other conservative care.  Informed consent was obtained after describing risk and benefits of the procedure with the patient, this includes bleeding, bruising, infection and medication side effects.  The patient wishes to proceed and has given written consent.  The patient was placed in a seated position.  The area of pain was marked and prepped with Betadine.  It was entered with a 25-gauge 1/2 inch needle and a total of 5 mL of 1% lidocaine and normal saline was injected into a total of 4 trigger points, after negative draw back for blood.  The patient tolerated the procedure well.  Post procedure instructions were given.     

## 2021-09-09 ENCOUNTER — Telehealth: Payer: Self-pay | Admitting: Pharmacist

## 2021-09-09 NOTE — Progress Notes (Signed)
Chronic Care Management Pharmacy Assistant   Name: [name] [name]  MRN: [id number] DOB: 11-02-1924   Reason for Encounter: General Adherence Call    Recent office visits:  08/25/2021 [facility] (PCP) Marin Olp, MD; no medication changes indicated.  Recent consult visits:  09/07/2021 [facility] (Physical Medicine) Izora Ribas, MD; no medication changes indicated.  07/13/2021 [facility] (Physical Medicine) Izora Ribas, MD; no medication changes indicated.  07/08/2021 [facility] (Orthopedics) Leandrew Koyanagi, MD; no medication changes indicated.  Hospital visits:  None in previous 6 months  Medications: Outpatient Encounter Medications as of 09/09/2021  Medication Sig   acetaminophen (TYLENOL) 325 MG tablet Take 1-2 tablets (325-650 mg total) by mouth every 4 (four) hours as needed for mild pain (or temp >/= 101 F).   apixaban (ELIQUIS) 5 MG TABS tablet Take 1 tablet by mouth twice daily   atorvastatin (LIPITOR) 40 MG tablet Take 1 tablet (40 mg total) by mouth daily.   baclofen (LIORESAL) 10 MG tablet TAKE 1/2 (ONE-HALF) TABLET BY MOUTH NIGHTLY   furosemide (LASIX) 20 MG tablet Take 1 tablet (20 mg total) by mouth daily.   ipratropium (ATROVENT) 0.06 % nasal spray Place 2 sprays into the nose 2 (two) times daily.   lidocaine (LIDODERM) 5 % Place 1 patch onto the skin daily. Remove & Discard patch within 12 hours or as directed by MD   lisinopril (ZESTRIL) 5 MG tablet Take 1 tablet by mouth once daily   metoprolol succinate (TOPROL-XL) 25 MG 24 hr tablet Take 3 tablets by mouth once daily   Multiple Vitamin (MULTIVITAMIN WITH MINERALS) TABS tablet Take 1 tablet by mouth daily.   pantoprazole (PROTONIX) 40 MG tablet Take one tablet twice daily as directed   RESTASIS 0.05 % ophthalmic emulsion Place 1 drop into both eyes 2 (two) times daily as needed (dry eyes).    No facility-administered encounter medications on file as of 09/09/2021.   [place] for [facility] Review  Call   Chart Review:  Have there been any documented new, changed, or discontinued medications since last visit? No  Has there been any documented recent hospitalizations or ED visits since last visit with Clinical Pharmacist? No   Adherence Review:  Does the Clinical Pharmacist Assistant have access to adherence rates? Yes Adherence rates for STAR metric medications: Atorvastatin 40 mg last filled 09/08/2021 90 DS Lisinopril 5 mg last filled 08/19/2021 90 DS Does the patient have >5 day gap between last estimated fill dates for any of the above medications or other medication gaps? No Reason for medication gaps.   Disease State Questions:  Able to connect with Patient? Yes Did patient have any problems with their health recently? No Have you had any admissions or emergency room visits or worsening of your condition(s) since last visit? No Have you had any visits with new specialists or providers since your last visit? No Have you had any new health care problem(s) since your last visit? No Have you run out of any of your medications since you last spoke with clinical pharmacist? No What caused you to run out of your medications? Are there any medications you are not taking as prescribed? No Are you having any issues or side effects with your medications? Yes Note of issues or side effects: Patient states medication Eliquis is expensive. She would like to apply for a patient assistance application. Do you have any other health concerns or questions you want to discuss with your Clinical  Pharmacist before your next visit? No Are there any health concerns that you feel we can do a better job addressing? No Are you having any problems with any of the following since the last visit:  None 12. Any falls since last visit? No 13. Any increased or uncontrolled pain since last visit? No   Care Gaps: Medicare Annual Wellness: Completed 01/21/2021 Hemoglobin A1C: 6.1% on  08/20/2020 Colonoscopy: Aged out Dexa Scan: Completed Mammogram: Completed 05/04/2016  Future Appointments  Date Time Provider [place]  10/06/2021 10:30 AM Pieter Partridge, DO [facility] None  10/08/2021 10:00 AM Skeet Latch, MD [facility]  11/09/2021  1:40 PM Raulkar, Clide Deutscher, MD [facility] CPR  12/07/2021  2:15 PM [facility] CCM PHARMACIST [facility]  12/28/2021 11:00 AM Raulkar, Clide Deutscher, MD [facility] CPR  02/03/2022  1:00 PM [facility] HEALTH COACH [facility]  02/22/2022 11:00 AM Raulkar, Clide Deutscher, MD [facility] CPR  02/25/2022  9:20 AM Marin Olp, MD [facility]  04/26/2022 11:00 AM Raulkar, Clide Deutscher, MD [facility] CPR   Star Rating Drugs: Atorvastatin 40 mg last filled 09/08/2021 90 DS Lisinopril 5 mg last filled 08/19/2021 90 DS  April D Calhoun, [place] Pharmacist Assistant [phone]

## 2021-09-13 ENCOUNTER — Telehealth: Payer: Self-pay | Admitting: Pharmacist

## 2021-09-13 NOTE — Progress Notes (Signed)
    Chronic Care Management Pharmacy Assistant   Name: AMERA BANOS  MRN: [id number] DOB: 1924-05-02   Reason for Encounter: PAP for Eliquis    PAP form initiated for Eliquis. Will be mailed to patient to complete patient portion and return to prescribing MD office for final portion to be completed by MD and faxed in for patient for processing. Patient will update on the outcome of their application once received.    Jobe Gibbon, [facility] Clinical Pharmacist Assistant  [phone]

## 2021-09-30 DIAGNOSIS — H10413 Chronic giant papillary conjunctivitis, bilateral: Secondary | ICD-10-CM | POA: Diagnosis not present

## 2021-09-30 DIAGNOSIS — H02831 Dermatochalasis of right upper eyelid: Secondary | ICD-10-CM | POA: Diagnosis not present

## 2021-09-30 DIAGNOSIS — Z961 Presence of intraocular lens: Secondary | ICD-10-CM | POA: Diagnosis not present

## 2021-09-30 DIAGNOSIS — H02834 Dermatochalasis of left upper eyelid: Secondary | ICD-10-CM | POA: Diagnosis not present

## 2021-09-30 DIAGNOSIS — H40053 Ocular hypertension, bilateral: Secondary | ICD-10-CM | POA: Diagnosis not present

## 2021-10-04 DIAGNOSIS — Z23 Encounter for immunization: Secondary | ICD-10-CM | POA: Diagnosis not present

## 2021-10-06 ENCOUNTER — Ambulatory Visit: Payer: Medicare Other | Admitting: Neurology

## 2021-10-08 ENCOUNTER — Encounter (HOSPITAL_BASED_OUTPATIENT_CLINIC_OR_DEPARTMENT_OTHER): Payer: Self-pay | Admitting: Cardiovascular Disease

## 2021-10-08 ENCOUNTER — Ambulatory Visit (INDEPENDENT_AMBULATORY_CARE_PROVIDER_SITE_OTHER): Payer: Medicare Other | Admitting: Cardiovascular Disease

## 2021-10-08 VITALS — BP 118/72 | HR 104 | Ht 59.0 in | Wt 169.5 lb

## 2021-10-08 DIAGNOSIS — J449 Chronic obstructive pulmonary disease, unspecified: Secondary | ICD-10-CM

## 2021-10-08 DIAGNOSIS — I739 Peripheral vascular disease, unspecified: Secondary | ICD-10-CM

## 2021-10-08 DIAGNOSIS — R600 Localized edema: Secondary | ICD-10-CM

## 2021-10-08 DIAGNOSIS — Z95828 Presence of other vascular implants and grafts: Secondary | ICD-10-CM

## 2021-10-08 DIAGNOSIS — E7800 Pure hypercholesterolemia, unspecified: Secondary | ICD-10-CM

## 2021-10-08 DIAGNOSIS — I4819 Other persistent atrial fibrillation: Secondary | ICD-10-CM

## 2021-10-08 DIAGNOSIS — I10 Essential (primary) hypertension: Secondary | ICD-10-CM | POA: Diagnosis not present

## 2021-10-08 DIAGNOSIS — I5032 Chronic diastolic (congestive) heart failure: Secondary | ICD-10-CM

## 2021-10-08 DIAGNOSIS — E785 Hyperlipidemia, unspecified: Secondary | ICD-10-CM

## 2021-10-08 MED ORDER — METOPROLOL SUCCINATE ER 100 MG PO TB24: 100.0000 mg | ORAL_TABLET | Freq: Every day | ORAL | 3 refills | Status: DC

## 2021-10-08 MED ORDER — ENTRESTO 24-26 MG PO TABS: ORAL_TABLET | ORAL | 5 refills | Status: DC

## 2021-10-08 NOTE — Assessment & Plan Note (Signed)
She remains in atrial fibrillation today.  Rates are not well controlled.  Increase metoprolol to 100 mg.  Continue Eliquis.

## 2021-10-08 NOTE — Patient Instructions (Addendum)
Medication Instructions:  INCREASE YOUR METOPROLOL TO 100 MG DAILY  OK TO TAKE 4 OF THE 25 MG TABLETS UNTIL YOU RUN OUT  STOP LISINOPRIL   ON SUNDAY START ENTRESTO 1/2 TABLET TWICE A DAY   TAKE FUROSEMIDE (LASIX) 40 MG FOR 2 DAYS ONLY THEN RESUME 20 MG DAILY   *If you need a refill on your cardiac medications before your next appointment, please call your pharmacy*  Lab Work: BMET/BNP WHEN YOU RETURN FOR ECHO   If you have labs (blood work) drawn today and your tests are completely normal, you will receive your results only by: MyChart Message (if you have MyChart) OR A paper copy in the mail If you have any lab test that is abnormal or we need to change your treatment, we will call you to review the results.  Testing/Procedures: Your physician has requested that you have an echocardiogram. Echocardiography is a painless test that uses sound waves to create images of your heart. It provides your doctor with information about the size and shape of your heart and how well your heart's chambers and valves are working. This procedure takes approximately one hour. There are no restrictions for this procedure.  SOON   Follow-Up: At [facility], you and your health needs are our priority.  As part of our continuing mission to provide you with exceptional heart care, we have created designated Provider Care Teams.  These Care Teams include your primary Cardiologist (physician) and Advanced Practice Providers (APPs -  Physician Assistants and Nurse Practitioners) who all work together to provide you with the care you need, when you need it.  We recommend signing up for the patient portal called "MyChart".  Sign up information is provided on this After Visit Summary.  MyChart is used to connect with patients for Virtual Visits (Telemedicine).  Patients are able to view lab/test results, encounter notes, upcoming appointments, etc.  Non-urgent messages can be sent to your provider as well.   To  learn more about what you can do with MyChart, go to [email or web address].    Your next appointment:   4 month(s)  The format for your next appointment:   In Person  Provider:   Skeet Latch, MD   [place] [facility] NP AFTER ECHO

## 2021-10-08 NOTE — Assessment & Plan Note (Signed)
Blood pressure is controlled in the office today.  She notes that it was 140s at home.  We will increase metoprolol to 100 mg.  We will also switch lisinopril to Entresto 1/2 24/'26mg'$  bid.

## 2021-10-08 NOTE — Progress Notes (Signed)
Cardiology Office Note   Date:  10/27/2021   ID:  [name] [name], DOB Nov 19, 1924, MRN [id number]  PCP:  Marin Olp, MD  Cardiologist:   Skeet Latch, MD   No chief complaint on file.    History of Present Illness: [name] [name] is a 88 y.o. female with hypertension, hyperlipidemia, COPD, PAF, and PAD status post right femoral to above-knee popliteal bypass (2006) here for follow up.  She has a known stenosis of her peripheral bypass based on an arteriogram 05/2018 which revealed 80% narrowing of the mid right femoral-popliteal bypass graft.  She also had 70% stenosis of her left common femoral..  She previously deferred intervention because of concern about having an operation at her age.  However she has had increasing symptoms of claudication and last saw Dr. Oneida Alar on 7/29.  She previously underwent revision of her femoral to above-knee bypass in 2010.  This was extended to below the knee popliteal artery.  She is on chronic warfarin therapy since her thrombolysis procedure several years ago.  She is scheduled to undergo aortogram with lower extremity runoff on 11/29/2019.  She was referred to cardiology for presurgical risk assessment prior to redo of her right leg bypass on 8/16.  [name] had a stress echo 12/2011 that revealed normal systolic function and no ischemia.  Ms. Smart was seen in 2021 for preoperative risk assessment prior to redo femoral-popliteal bypass grafting.  At that appointment she was noted to be in atrial fibrillation, which was a new finding.  She was started on metoprolol for rate control and was already on warfarin.  After her surgery she developed rectal bleeding due to acute colitis.  That hospitalization was also complicated by atrial fibrillation with RVR.  She followed up with [name], NP on 02/2020 and was doing well.  Prior to that appointment her furosemide was increased due to volume overload.  Echo 11/2019 revealed LVEF 60 to 65% with mild  left atrial enlargement and no other significant abnormalities.  She had ABIs 04/2020 which were 0.97 on the right and 1.29 on the left.  Arterial Dopplers revealed patent bypass grafts without stenosis.  At the last appointment her BP was uncontrolled in the office but reportedly controlled at home. She followed up with vascular 04/2021 and was stable. Today, she has been feeling good aside from noticing more swelling in her legs and some weight gain. This has been ongoing for about 3-4 weeks; she does notice some improvement in her swelling throughout the day. She has been focusing on drinking enough water, but has noticed decreased urine production. Also, she complains of lower energy levels, but her ability to complete her ADL's remains unchanged. However, she does report some issues with imbalance, and ambulates with a cane. At home her BP was 528 systolic this morning. Her heart rate has been below the 90's on average. Additionally she complains of a little bit of pain randomly localized to different areas every once in a while, which she describes as "like a different blood vessel giving her the pain each time."  She endorses chronic back pain. Of note, she has felt some anxiety lately, including preparing for her family visiting for her birthday and worrying about other family living in [place]. She remains compliant with all of her medications. She denies any palpitations, or chest pain. No lightheadedness, headaches, syncope, orthopnea, or PND.   Past Medical History:  Diagnosis Date   Acute on chronic diastolic heart failure (  [place]) 12/30/2020   Allergic rhinoconjunctivitis    Allergy    Anxiety    pt. managed- uses deep breathing    Arthritis    low back , stenosis   COLONIC POLYPS, RECURRENT 08/29/2006   2008 last colonoscopy. No further colonoscopy.      COPD (chronic obstructive pulmonary disease) (HCC)    Diverticulosis    Dysrhythmia    afib   Eczema    Environmental allergies     allergy shot- q friday in Dr. Janee Morn office. PFT's abnormal- recommended Spiriva to use preop & will d/c after surgery   GERD (gastroesophageal reflux disease)    Hiatal hernia    Hyperlipidemia    Hypertension    Lung nodule 2011   Paroxysmal atrial fibrillation ([place]) 11/27/2019   Peripheral vascular disease (HCC)    Shingles    Stenosis of popliteal artery (HCC)    blood clots in legs long ago     Trigeminal neuralgia    Left buttocks    Past Surgical History:  Procedure Laterality Date   ABDOMINAL AORTAGRAM N/A 06/17/2011   Procedure: ABDOMINAL Maxcine Ham;  Surgeon: Elam Dutch, MD;  Location: [facility] CATH [facility];  Service: Cardiovascular;  Laterality: N/A;   ABDOMINAL AORTOGRAM W/LOWER EXTREMITY Bilateral 06/22/2018   Procedure: ABDOMINAL AORTOGRAM W/LOWER EXTREMITY;  Surgeon: Elam Dutch, MD;  Location: [facility];  Service: Cardiovascular;  Laterality: Bilateral;   ABDOMINAL AORTOGRAM W/LOWER EXTREMITY Bilateral 11/29/2019   Procedure: ABDOMINAL AORTOGRAM W/LOWER EXTREMITY;  Surgeon: Elam Dutch, MD;  Location: [facility];  Service: Cardiovascular;  Laterality: Bilateral;   CATARACT EXTRACTION, BILATERAL     w IOL   CHOLECYSTECTOMY  1998   COLONOSCOPY     ENDARTERECTOMY FEMORAL Left 12/09/2019   Procedure: ENDARTERECTOMY FEMORAL with bovine patch angioplasty.;  Surgeon: Elam Dutch, MD;  Location: [facility];  Service: Vascular;  Laterality: Left;   FEMORAL-POPLITEAL BYPASS GRAFT        x2 surgeries 1990's & 2009   FEMORAL-POPLITEAL BYPASS GRAFT Right 12/09/2019   Procedure: REDO RIGHT FEMORAL-POPLITEAL ARTERY BYPASS GRAFT;  Surgeon: Elam Dutch, MD;  Location: [facility];  Service: Vascular;  Laterality: Right;   INJECTION KNEE Right Aug. 2016   Gel injection for pain   LUMBAR FUSION  07/06/2011   TONSILLECTOMY     as a teenager    TUBAL LIGATION       Current Outpatient Medications  Medication Sig Dispense Refill   acetaminophen (TYLENOL) 325 MG  tablet Take 1-2 tablets (325-650 mg total) by mouth every 4 (four) hours as needed for mild pain (or temp >/= 101 F).     apixaban (ELIQUIS) 5 MG TABS tablet Take 1 tablet by mouth twice daily 180 tablet 1   atorvastatin (LIPITOR) 40 MG tablet Take 1 tablet (40 mg total) by mouth daily. 90 tablet 3   furosemide (LASIX) 20 MG tablet Take 1 tablet (20 mg total) by mouth daily. 90 tablet 2   ipratropium (ATROVENT) 0.06 % nasal spray Place 2 sprays into the nose 2 (two) times daily. 15 mL 11   lidocaine (LIDODERM) 5 % Place 1 patch onto the skin daily. Remove & Discard patch within 12 hours or as directed by MD 90 patch 3   metoprolol succinate (TOPROL-XL) 100 MG 24 hr tablet Take 1 tablet (100 mg total) by mouth daily. Take with or immediately following a meal. 90 tablet 3   Multiple Vitamin (MULTIVITAMIN WITH MINERALS) TABS tablet  Take 1 tablet by mouth daily.     pantoprazole (PROTONIX) 40 MG tablet Take one tablet twice daily as directed 60 tablet 11   RESTASIS 0.05 % ophthalmic emulsion Place 1 drop into both eyes 2 (two) times daily as needed (dry eyes).   99   baclofen (LIORESAL) 10 MG tablet TAKE 1/2 (ONE-HALF) TABLET BY MOUTH NIGHTLY 30 tablet 0   lisinopril (ZESTRIL) 5 MG tablet Take 1 tablet (5 mg total) by mouth daily. 90 tablet 3   No current facility-administered medications for this visit.    Allergies:   Sulfa antibiotics, Tiotropium bromide, Latex, Ultram [tramadol], and 5-alpha reductase inhibitors   Social History:  The patient  reports that she quit smoking about 28 years ago. Her smoking use included cigarettes. She has a 60.00 pack-year smoking history. She has never used smokeless tobacco. She reports that she does not drink alcohol and does not use drugs.   Family History:  The patient's family history includes Arthritis in her mother; Brain cancer in her sister; Breast cancer in her daughter and sister; Colon cancer in her sister; Colon polyps in her father; Heart disease in  her father; Hypertension in her mother and son; Lung cancer in her brother and sister.    ROS:   Please see the history of present illness. (+) LE edema (+) Weight gain (+) Fatigue (+) Imbalance (+) Stress/Anxiety (+) Chronic back pain All other systems are reviewed and negative.     PHYSICAL EXAM: VS:  BP 118/72 (BP Location: Left Arm, Patient Position: Sitting, Cuff Size: Normal)   Pulse (!) 104   Ht '4\' 11"'$  (1.499 m)   Wt 169 lb 8 oz (76.9 kg)   BMI 34.23 kg/m  , BMI Body mass index is 34.23 kg/m. GENERAL:  Well appearing HEENT:  Pupils equal round and reactive, fundi not visualized, oral mucosa unremarkable NECK:  No jugular venous distention, waveform within normal limits, carotid upstroke brisk and symmetric, no bruits LUNGS:  Clear to auscultation bilaterally HEART:  Irregularly irregular. Tachycardic.  PMI not displaced or sustained,S1 and S2 within normal limits, no S3, no S4, no clicks, no rubs, no murmurs. Distant heart sounds ABD:  Flat, positive bowel sounds normal in frequency in pitch, no bruits, no rebound, no guarding, no midline pulsatile mass, no hepatomegaly, no splenomegaly. EXT:  Unable to palpate pedal or popliteal pulses bilaterally.  2+Femoral and radial pulses.  Trace edema, no cyanosis no clubbing SKIN:  No rashes no nodules NEURO:  Cranial nerves II through XII grossly intact, motor grossly intact throughout PSYCH:  Cognitively intact, oriented to person place and time  EKG:  EKG is personally reviewed. 10/08/2021:  Atrial fibrillation. Rate 104 bpm.  PVC.  Low voltage. 12/30/20: Atrial fibrillation.  Rte 86 bpm.  Low voltage.   11/27/2019:  atrial fibrillation.  Rate 111 bpm.  LE Arterial Duplex Study  05/25/2021: Summary:  Right: Bakers cyst noted. Patent right femoral-popliteal bypass grafts  without evidence of stenosis.   Left: Patent left femoral-popliteal bypass grafts without evidence of  stenosis.   ABI Doppler  05/25/2021: Right ABIs  appear essentially unchanged. Left ABIs appear decreased.     Summary:  Right: Resting right ankle-brachial index is within normal range. No  evidence of significant right lower extremity arterial disease. The right  toe-brachial index is abnormal.   Left: Resting left ankle-brachial index indicates noncompressible left  lower extremity arteries. The left toe-brachial index is abnormal.   Abdominal Aortogram  11/29/2019: Procedure:  Abdominal aortogram with bilateral lower extremity runoff   Preoperative diagnosis: Rest pain right foot   Postoperative diagnosis: Same   Anesthesia: Local with sedation   Operative findings: 1.  90% stenosis left common femoral artery above femoral below-knee popliteal bypass two-vessel runoff left foot anterior tibial dominant   2.  Occlusion right femoral to below-knee popliteal bypass and native right superficial femoral and popliteal arteries.   3.  Patent right below-knee popliteal artery with one-vessel peroneal runoff   Operative details: After team informed consent, the patient taken the [place] lab.  The patient was placed in supine position angio table.  Both groins were prepped and draped in usual sterile fashion.  Ultrasound was used to examine the patient's left groin.  I could not find a reasonable window that appeared to have a good lumen within it and identify the left femoral-popliteal bypass.  Therefore I decided to stick the right groin.  Ultrasound was used to identify the right common femoral artery and femoral bifurcation.  Using ultrasound guidance after local anesthetic infiltration I was able to successfully cannulate the right common femoral artery and advance an 035 Rosen wire into the abdominal aorta.  A 6 French dilator was then placed over this to dilate up the tract through pre-existing scar.  5 French sheath was then placed over the guidewire and thoroughly flushed with heparinized saline.  5 French pigtail catheter was then advanced  into the abdominal aorta and abdominal aortogram obtained in AP projection.  Left and right renal arteries are patent.  Infrarenal abdominal aorta is patent.  The left and right common external and internal iliac arteries are all patent.  Next pigtail catheter was pulled down above the aortic bifurcation and bilateral oblique views of the pelvis were obtained with magnification.  This confirmed the above findings.  Additionally there is a 90% stenosis in the mid left common femoral artery above the level of her bypass.  On the right side the right common femoral is patent.  The right profundus patent.  The native right superficial femoral artery occludes at its origin.   Next bilateral lower extremity runoff views were obtained through the pigtail catheter.   In the right lower extremity, the right common femoral and profunda is patent.  The right femoropopliteal bypass is occluded throughout its course.  The native right superficial femoral artery above-knee popliteal and most of the below-knee popliteal artery is occluded.  The distal below-knee popliteal artery does reconstitute and gives off one-vessel runoff to the peroneal.   In the left lower extremity, the left common femoral artery is patent but there is a stenosis in the midportion about 90% which is just above the level of an existing femoral to below-knee popliteal bypass.  The profunda is patent.  The femoral to below-knee popliteal bypass is patent with two-vessel runoff of the left foot dominant AT runoff.  The native left superficial femoral and popliteal arteries are occluded.   At this point the 5 French pigtail catheter was removed over guidewire.  The 5 French sheath was thoroughly flushed with heparinized saline.  The sheath was left in place to be pulled in the holding area.   Operative management: The patient will be scheduled on December 09, 2019 for redo right femoral to below-knee popliteal bypass with PTFE.  Additionally we will do  a left common femoral endarterectomy to salvage the left femoropopliteal bypass at the same operation.   Patient was updated on the plan and I discussed this  with her daughter by phone.   The patient will be admitted tonight for 23-hour observation since she does not have anyone at home with her tonight  Echo 11/27/2019:  1. Left ventricular ejection fraction, by estimation, is 60 to 65%. The  left ventricle has normal function. The left ventricle has no regional  wall motion abnormalities. The left ventricular internal cavity size was  mildly dilated. Left ventricular  diastolic function could not be evaluated.   2. Right ventricular systolic function is normal. The right ventricular  size is normal. There is mildly elevated pulmonary artery systolic  pressure.   3. Left atrial size was mildly dilated.   4. The mitral valve is normal in structure. No evidence of mitral valve  regurgitation. No evidence of mitral stenosis.   5. The aortic valve is grossly normal. Aortic valve regurgitation is not  visualized. No aortic stenosis is present.    Recent Labs: 01/08/2021: ALT 12 04/29/2021: Hemoglobin 13.9; Platelets 199 10/20/2021: BNP 417.6; BUN 37; Creatinine, Ser 1.48; Potassium 4.6; Sodium 140    Lipid Panel    Component Value Date/Time   CHOL 134 01/08/2021 0903   TRIG 130 01/08/2021 0903   TRIG 141 04/10/2006 0812   HDL 55 01/08/2021 0903   CHOLHDL 2.4 01/08/2021 0903   CHOLHDL 2.6 12/10/2019 0523   VLDL 15 12/10/2019 0523   LDLCALC 56 01/08/2021 0903   LDLDIRECT 46.0 06/10/2019 1132      Wt Readings from Last 3 Encounters:  10/08/21 169 lb 8 oz (76.9 kg)  09/07/21 167 lb 9.6 oz (76 kg)  08/25/21 167 lb 6.4 oz (75.9 kg)      ASSESSMENT AND PLAN:  Essential hypertension Blood pressure is controlled in the office today.  She notes that it was 140s at home.  We will increase metoprolol to 100 mg.  We will also switch lisinopril to Entresto 1/2 24/'26mg'$  bid.    Chronic  diastolic heart failure ([place]) She has had increased edema lately and thinks this is also why her weight is up.  She appears to be very mildly volume overloaded on exam today.  She has been very tired as well.  We will increase her Lasix to 40 mg daily for couple days.  We will also switch her lisinopril to [facility] as above.  Increase metoprolol for improved heart rate control 200 mg daily.  Echo in the next week or 2.  Check a BMP and a BNP at that time as well.  Persistent atrial fibrillation ([place]) She remains in atrial fibrillation today.  Rates are not well controlled.  Increase metoprolol to 100 mg.  Continue Eliquis.  Peripheral arterial disease ([place]) She is doing well and has no claudication.  Lipids are controlled on atorvastatin.  She is on Eliquis.  # Paroxysmal atrial fibrillation:  Ms. Sant is doing well.  Continue Eliquis and increase metoprolol to '100mg'$  daily.  # LE Edema:  She has more edema lately.  Switch lisinopril to Entresto.  Take lasix '40mg'$  daily x2 days then '20mg'$  daily.  Check echo.  Check BMP/BNP when she comes for her echo.  # Hypertension:  Pressure remains poorly-controlled.  We will increase metoprolol to '100mg'$  daily.  Stop lisinopril and start Entesto 1/2 tablet of 24/'26mg'$  bid.     # PAD:  # Hyperlipidemia: Ms. Turlington is s/p re-do R femoral-popliteal bypass 8/2021s.  Lipids are at goal.  Continue atorvastatin.  Continue Eliquis.   Current medicines are reviewed at length with the patient  today.  The patient does not have concerns regarding medicines.  The following changes have been made:  none  Labs/ tests ordered today include:   Orders Placed This Encounter  Procedures   Basic metabolic panel   B Nat Peptide   EKG 12-Lead   ECHOCARDIOGRAM COMPLETE    Disposition:    FU with APP in 2 weeks. FU with Dalon Reichart C. Oval Linsey, MD, [facility] in 4 months.    I,Mathew Stumpf,acting as a Education administrator for Skeet Latch, MD.,have documented all relevant  documentation on the behalf of Skeet Latch, MD,as directed by  Skeet Latch, MD while in the presence of Skeet Latch, MD.  I, [place] Oval Linsey, MD have reviewed all documentation for this visit.  The documentation of the exam, diagnosis, procedures, and orders on 10/27/2021 are all accurate and complete.   Signed, Fletcher Rathbun C. Oval Linsey, MD, [facility]  10/27/2021 9:13 AM    [place]

## 2021-10-08 NOTE — Assessment & Plan Note (Signed)
She is doing well and has no claudication.  Lipids are controlled on atorvastatin.  She is on Eliquis.

## 2021-10-08 NOTE — Assessment & Plan Note (Signed)
She has had increased edema lately and thinks this is also why her weight is up.  She appears to be very mildly volume overloaded on exam today.  She has been very tired as well.  We will increase her Lasix to 40 mg daily for couple days.  We will also switch her lisinopril to [facility] as above.  Increase metoprolol for improved heart rate control 200 mg daily.  Echo in the next week or 2.  Check a BMP and a BNP at that time as well.

## 2021-10-20 ENCOUNTER — Telehealth: Payer: Self-pay | Admitting: Cardiovascular Disease

## 2021-10-20 ENCOUNTER — Ambulatory Visit (INDEPENDENT_AMBULATORY_CARE_PROVIDER_SITE_OTHER): Payer: Medicare Other

## 2021-10-20 DIAGNOSIS — I5032 Chronic diastolic (congestive) heart failure: Secondary | ICD-10-CM | POA: Diagnosis not present

## 2021-10-20 DIAGNOSIS — I4819 Other persistent atrial fibrillation: Secondary | ICD-10-CM | POA: Diagnosis not present

## 2021-10-20 DIAGNOSIS — I10 Essential (primary) hypertension: Secondary | ICD-10-CM

## 2021-10-20 DIAGNOSIS — R600 Localized edema: Secondary | ICD-10-CM

## 2021-10-20 LAB — ECHOCARDIOGRAM COMPLETE
Area-P 1/2: 2.87 cm2
Calc EF: 47.2 %
S' Lateral: 2.35 cm
Single Plane A2C EF: 32.8 %
Single Plane A4C EF: 59.9 %

## 2021-10-20 NOTE — Telephone Encounter (Signed)
Pt c/o medication issue:  1. Name of Medication: sacubitril-valsartan (ENTRESTO) 24-26 MG  2. How are you currently taking this medication (dosage and times per day)? As prescribed   3. Are you having a reaction (difficulty breathing--STAT)? Yes   4. What is your medication issue?  Pt states since starting this new medication she has been in a fog like state. Not as alert. Requesting call back.

## 2021-10-20 NOTE — Telephone Encounter (Signed)
Busy x 2. 

## 2021-10-21 ENCOUNTER — Telehealth (HOSPITAL_BASED_OUTPATIENT_CLINIC_OR_DEPARTMENT_OTHER): Payer: Self-pay

## 2021-10-21 LAB — BASIC METABOLIC PANEL
BUN/Creatinine Ratio: 25 (ref 12–28)
BUN: 37 mg/dL — ABNORMAL HIGH (ref 10–36)
CO2: 23 mmol/L (ref 20–29)
Calcium: 9.3 mg/dL (ref 8.7–10.3)
Chloride: 103 mmol/L (ref 96–106)
Creatinine, Ser: 1.48 mg/dL — ABNORMAL HIGH (ref 0.57–1.00)
Glucose: 176 mg/dL — ABNORMAL HIGH (ref 70–99)
Potassium: 4.6 mmol/L (ref 3.5–5.2)
Sodium: 140 mmol/L (ref 134–144)
eGFR: 33 mL/min/{1.73_m2} — ABNORMAL LOW (ref >59)

## 2021-10-21 LAB — BRAIN NATRIURETIC PEPTIDE: BNP: 417.6 pg/mL — ABNORMAL HIGH (ref 0.0–100.0)

## 2021-10-21 MED ORDER — LISINOPRIL 5 MG PO TABS: 5.0000 mg | ORAL_TABLET | Freq: Every day | ORAL | 3 refills | Status: DC

## 2021-10-21 NOTE — Telephone Encounter (Signed)
Returned call to review echo results, patient states that the Delene Loll is making her feel foggy, unbalanced and has increased her fatigue.   Patient is leaving today for the beach and would like to be called at [phone]   Patient blood pressures since on medication have been lower than normal  Was running 130-140/80 and has now dropped down to 110-120/70

## 2021-10-21 NOTE — Telephone Encounter (Signed)
Please advise as Dr. Oval Linsey is out of office, thanks!

## 2021-10-21 NOTE — Telephone Encounter (Signed)
Can we please ask her for any heart rate readings?  Okay to stop Entresto, wait 36 hours, then resume Lisinopril '5mg'$  QD.   Please ask her to check BP once per day and call us if BP consistently <110/60, >130/80, or if still feeling poorly.   Loel Dubonnet, NP

## 2021-10-21 NOTE — Telephone Encounter (Addendum)
Results called to patient who verbalizes understanding!    ----- Message from Skeet Latch, MD sent at 10/20/2021  6:20 PM EDT ----- Echo shows that her heart is squeezing normally.  No significant change from prior.

## 2021-10-21 NOTE — Telephone Encounter (Signed)
RN attempted to return call to pt at [phone] at patient's request as she stated she will be traveling. No answer, left message to call back, updated prescription sent to pharmacy on file.      "Can we please ask her for any heart rate readings?   Okay to stop Entresto, wait 36 hours, then resume Lisinopril '5mg'$  QD.    Please ask her to check BP once per day and call us if BP consistently <110/60, >130/80, or if still feeling poorly.    Loel Dubonnet, NP "

## 2021-10-21 NOTE — Telephone Encounter (Signed)
Would confirm with Dr. Oval Linsey, but since pt is already on just 1/2 of the lowest dose, would stop Entresto and resume lisinopril 36 hours later.

## 2021-10-22 MED ORDER — LISINOPRIL 5 MG PO TABS: 5.0000 mg | ORAL_TABLET | Freq: Every day | ORAL | 3 refills | Status: DC

## 2021-10-22 NOTE — Telephone Encounter (Signed)
Patient returned call. She can be reached at [phone]

## 2021-10-22 NOTE — Telephone Encounter (Signed)
RN attempted to return call to pt at [phone] at patient's request as she stated she will be traveling. No answer, left message to call back, updated prescription sent to pharmacy on file.       "Can we please ask her for any heart rate readings?   Okay to stop Entresto, wait 36 hours, then resume Lisinopril '5mg'$  QD.    Please ask her to check BP once per day and call us if BP consistently <110/60, >130/80, or if still feeling poorly.    Loel Dubonnet, NP "

## 2021-10-22 NOTE — Telephone Encounter (Signed)
RN returned call to patient, who is currently at the beach and doesn't have her heart rate logs with her. Patient is going to stop her Delene Loll and resume her Lisinopril '5mg'$  daily on Monday.       "Can we please ask her for any heart rate readings?   Okay to stop Entresto, wait 36 hours, then resume Lisinopril '5mg'$  QD.    Please ask her to check BP once per day and call us if BP consistently <110/60, >130/80, or if still feeling poorly.    Loel Dubonnet, NP "

## 2021-10-22 NOTE — Addendum Note (Signed)
Addended by: Gerald Stabs on: 10/22/2021 02:32 PM   Modules accepted: Orders

## 2021-10-25 ENCOUNTER — Other Ambulatory Visit: Payer: Self-pay | Admitting: Physical Medicine and Rehabilitation

## 2021-10-25 ENCOUNTER — Ambulatory Visit (HOSPITAL_BASED_OUTPATIENT_CLINIC_OR_DEPARTMENT_OTHER): Payer: Medicare Other | Admitting: Family

## 2021-10-27 ENCOUNTER — Encounter (HOSPITAL_BASED_OUTPATIENT_CLINIC_OR_DEPARTMENT_OTHER): Payer: Self-pay | Admitting: Cardiovascular Disease

## 2021-11-01 ENCOUNTER — Other Ambulatory Visit (HOSPITAL_BASED_OUTPATIENT_CLINIC_OR_DEPARTMENT_OTHER): Payer: Self-pay

## 2021-11-01 DIAGNOSIS — Z79899 Other long term (current) drug therapy: Secondary | ICD-10-CM

## 2021-11-01 MED ORDER — FUROSEMIDE 40 MG PO TABS: 40.0000 mg | ORAL_TABLET | Freq: Every day | ORAL | 11 refills | Status: DC

## 2021-11-02 ENCOUNTER — Ambulatory Visit: Payer: Medicare Other | Admitting: Physical Medicine and Rehabilitation

## 2021-11-02 ENCOUNTER — Encounter
Payer: Medicare Other | Attending: Physical Medicine and Rehabilitation | Admitting: Physical Medicine and Rehabilitation

## 2021-11-02 VITALS — BP 106/74 | HR 91 | Ht 59.0 in | Wt 165.8 lb

## 2021-11-02 DIAGNOSIS — M791 Myalgia, unspecified site: Secondary | ICD-10-CM | POA: Insufficient documentation

## 2021-11-02 NOTE — Progress Notes (Signed)
Trigger Point Injection  Indication: Lumbar myofascial pain not relieved by medication management and other conservative care.  Informed consent was obtained after describing risk and benefits of the procedure with the patient, this includes bleeding, bruising, infection and medication side effects.  The patient wishes to proceed and has given written consent.  The patient was placed in a seated position.  The area of pain was marked and prepped with Betadine.  It was entered with a 25-gauge 1/2 inch needle and a total of 5 mL of 1% lidocaine and normal saline was injected into a total of 4 trigger points, after negative draw back for blood.  The patient tolerated the procedure well.  Post procedure instructions were given.     

## 2021-11-04 ENCOUNTER — Encounter (HOSPITAL_BASED_OUTPATIENT_CLINIC_OR_DEPARTMENT_OTHER): Payer: Self-pay | Admitting: Family

## 2021-11-04 ENCOUNTER — Ambulatory Visit (INDEPENDENT_AMBULATORY_CARE_PROVIDER_SITE_OTHER): Payer: Medicare Other | Admitting: Family

## 2021-11-04 VITALS — BP 122/66 | HR 89 | Ht 59.0 in | Wt 166.0 lb

## 2021-11-04 DIAGNOSIS — R5383 Other fatigue: Secondary | ICD-10-CM

## 2021-11-04 DIAGNOSIS — I5032 Chronic diastolic (congestive) heart failure: Secondary | ICD-10-CM

## 2021-11-04 DIAGNOSIS — R600 Localized edema: Secondary | ICD-10-CM | POA: Diagnosis not present

## 2021-11-04 DIAGNOSIS — K219 Gastro-esophageal reflux disease without esophagitis: Secondary | ICD-10-CM

## 2021-11-04 DIAGNOSIS — I4819 Other persistent atrial fibrillation: Secondary | ICD-10-CM | POA: Diagnosis not present

## 2021-11-04 MED ORDER — FAMOTIDINE 20 MG PO TABS: ORAL_TABLET | ORAL | 2 refills | Status: AC

## 2021-11-04 NOTE — Patient Instructions (Addendum)
Medication Instructions:  Your physician has recommended you make the following change in your medication:    Recommend adding Famotidine (Pepcid) '20mg'$  daily in the evening for 1 week. Then use as needed.  Continue Furosemide '40mg'$  daily. We will consider changes to your medications based on your blood work.   *If you need a refill on your cardiac medications before your next appointment, please call your pharmacy*   Lab Work: Your physician recommends that you return for lab work today: thyroid panel, CMP, CBC, BNP   If you have labs (blood work) drawn today and your tests are completely normal, you will receive your results only by: [place] (if you have MyChart) OR A paper copy in the mail If you have any lab test that is abnormal or we need to change your treatment, we will call you to review the results.  Testing/Procedures: None ordered today. Your echocardiogram that you had recently looked good! It showed your heart muscle was working well.   Follow-Up: At [facility], you and your health needs are our priority.  As part of our continuing mission to provide you with exceptional heart care, we have created designated Provider Care Teams.  These Care Teams include your primary Cardiologist (physician) and Advanced Practice Providers (APPs -  Physician Assistants and Nurse Practitioners) who all work together to provide you with the care you need, when you need it.  We recommend signing up for the patient portal called "MyChart".  Sign up information is provided on this After Visit Summary.  MyChart is used to connect with patients for Virtual Visits (Telemedicine).  Patients are able to view lab/test results, encounter notes, upcoming appointments, etc.  Non-urgent messages can be sent to your provider as well.   To learn more about what you can do with MyChart, go to [email or web address].    Your next appointment:   As scheduled with Dr. Oval Linsey  Other  Instructions  To prevent or reduce lower extremity swelling: Eat a low salt diet. Salt makes the body hold onto extra fluid which causes swelling. Sit with legs elevated. For example, in the recliner or on an [place].  Wear knee-high compression stockings during the daytime. Ones labeled 15-20 mmHg provide good compression.   Heart Healthy Diet Recommendations: A low-salt diet is recommended. Meats should be grilled, baked, or boiled. Avoid fried foods. Focus on lean protein sources like fish or chicken with vegetables and fruits. The American Heart Association is a Microbiologist!  American Heart Association Diet and Lifeystyle Recommendations   Exercise recommendations: The American Heart Association recommends 150 minutes of moderate intensity exercise weekly. Try 30 minutes of moderate intensity exercise 4-5 times per week. This could include walking, jogging, or swimming.

## 2021-11-04 NOTE — Progress Notes (Signed)
Office Visit    Patient Name: [name] [name] Date of Encounter: 11/04/2021  PCP:  Marin Olp, [place]  Cardiologist:  Skeet Latch, MD  Advanced Practice Provider:  No care team member to display Electrophysiologist:  None      Chief Complaint    [name] [name] is a 88 y.o. female presents today for follow-up after transition to [facility]  Past Medical History    Past Medical History:  Diagnosis Date   Acute on chronic diastolic heart failure ([place]) 12/30/2020   Allergic rhinoconjunctivitis    Allergy    Anxiety    pt. managed- uses deep breathing    Arthritis    low back , stenosis   COLONIC POLYPS, RECURRENT 08/29/2006   2008 last colonoscopy. No further colonoscopy.      COPD (chronic obstructive pulmonary disease) (HCC)    Diverticulosis    Dysrhythmia    afib   Eczema    Environmental allergies    allergy shot- q friday in Dr. Janee Morn office. PFT's abnormal- recommended Spiriva to use preop & will d/c after surgery   GERD (gastroesophageal reflux disease)    Hiatal hernia    Hyperlipidemia    Hypertension    Lung nodule 2011   Paroxysmal atrial fibrillation ([place]) 11/27/2019   Peripheral vascular disease (HCC)    Shingles    Stenosis of popliteal artery (HCC)    blood clots in legs long ago     Trigeminal neuralgia    Left buttocks   Past Surgical History:  Procedure Laterality Date   ABDOMINAL AORTAGRAM N/A 06/17/2011   Procedure: ABDOMINAL Maxcine Ham;  Surgeon: Elam Dutch, MD;  Location: [facility] CATH LAB;  Service: Cardiovascular;  Laterality: N/A;   ABDOMINAL AORTOGRAM W/LOWER EXTREMITY Bilateral 06/22/2018   Procedure: ABDOMINAL AORTOGRAM W/LOWER EXTREMITY;  Surgeon: Elam Dutch, MD;  Location: [place] CV LAB;  Service: Cardiovascular;  Laterality: Bilateral;   ABDOMINAL AORTOGRAM W/LOWER EXTREMITY Bilateral 11/29/2019   Procedure: ABDOMINAL AORTOGRAM W/LOWER EXTREMITY;  Surgeon: Elam Dutch, MD;   Location: [place] CV LAB;  Service: Cardiovascular;  Laterality: Bilateral;   CATARACT EXTRACTION, BILATERAL     w IOL   CHOLECYSTECTOMY  1998   COLONOSCOPY     ENDARTERECTOMY FEMORAL Left 12/09/2019   Procedure: ENDARTERECTOMY FEMORAL with bovine patch angioplasty.;  Surgeon: Elam Dutch, MD;  Location: [place];  Service: Vascular;  Laterality: Left;   FEMORAL-POPLITEAL BYPASS GRAFT        x2 surgeries 1990's & 2009   FEMORAL-POPLITEAL BYPASS GRAFT Right 12/09/2019   Procedure: REDO RIGHT FEMORAL-POPLITEAL ARTERY BYPASS GRAFT;  Surgeon: Elam Dutch, MD;  Location: [place];  Service: Vascular;  Laterality: Right;   INJECTION KNEE Right Aug. 2016   Gel injection for pain   LUMBAR FUSION  07/06/2011   TONSILLECTOMY     as a teenager    TUBAL LIGATION     Allergies  Allergies  Allergen Reactions   Sulfa Antibiotics Other (See Comments)    Cold sweat light headed and disorientation   Tiotropium Bromide Shortness Of Breath and Other (See Comments)    Sore throat also   Latex Itching and Rash   Ultram [Tramadol] Nausea And Vomiting   5-Alpha Reductase Inhibitors     History of Present Illness    [name] [name] is a 88 y.o. female with a hx of hypertension, hyperlipidemia, COPD, persist atrial fibrillation, PAD s/p right fifth  femoral to above-knee popliteal bypass with revision in 2010 in last seen 10/08/2021 by Dr. Oval Linsey.  At last clinic visit 6/60/23 she was transitioned from lisinopril to [facility] half tablet of 24-26 mg twice daily due to lower extremity edema.  She is to take Lasix 40 mg daily for 2 days then 20 mg daily.  Echo ordered and performed 10/12/2021 with LVEF 65 to 70%, no RWMA, mild LVH, indeterminate diastolic parameters due to atrial fibrillation, LA mildly dilated, mild MR.  She contacted the office 10/20/2021 noting feeling as if she was in fog since starting [place].  She was recommended to stop Entresto and resume lisinopril 36 hours later.  She  presents today for follow-up with her daughter.   Feells her energy evel has improved since switching back from Entresto to Lisinopril. Still feels as if she is fatigued during the daytime. Her daughter wonders whether it is related to Metoprolol.   Does get up at night 2-3 times to use the restroom.   Goes to bed 9:30pm-10pm. She ays in bed or watched a show until around 11pm. She wakes up at 2AM, 4AM to use the restroom. After 4 AM has trouble falling back to sleep often not falling to 6AM. HSe does wait to get up until 8 AM. She has not been taking her Melatonin.   She does take her Lasix in the morning. She is taking Lasix '40mg'$  daily  Half cup of decaf coffee int he morning,  Her 16 oz glass of water she drinks 3 times per day.   She notes her stomach has been bothering her. Eats a few bites then feel "stuffed".   EKGs/Labs/Other Studies Reviewed:   The following studies were reviewed today:  Echo 10/20/2021   1. Left ventricular ejection fraction, by estimation, is 65 to 70%. The  left ventricle has normal function. The left ventricle has no regional  wall motion abnormalities. There is mild concentric left ventricular  hypertrophy. Left ventricular diastolic  parameters are indeterminate.   2. Right ventricular systolic function is normal. The right ventricular  size is normal.   3. Left atrial size was mildly dilated.   4. The mitral valve is normal in structure. Mild mitral valve  regurgitation. No evidence of mitral stenosis.   5. The aortic valve was not well visualized. Aortic valve regurgitation  is not visualized.   6. The inferior vena cava is normal in size with greater than 50%  respiratory variability, suggesting right atrial pressure of 3 mmHg.   Comparison(s): No significant change from prior study.   EKG: No EKG today  Recent Labs: 01/08/2021: ALT 12 04/29/2021: Hemoglobin 13.9; Platelets 199 10/20/2021: BNP 417.6; BUN 37; Creatinine, Ser 1.48; Potassium 4.6;  Sodium 140  Recent Lipid Panel    Component Value Date/Time   CHOL 134 01/08/2021 0903   TRIG 130 01/08/2021 0903   TRIG 141 04/10/2006 0812   HDL 55 01/08/2021 0903   CHOLHDL 2.4 01/08/2021 0903   CHOLHDL 2.6 12/10/2019 0523   VLDL 15 12/10/2019 0523   LDLCALC 56 01/08/2021 0903   LDLDIRECT 46.0 06/10/2019 1132    Risk Assessment/Calculations:   CHA2DS2-VASc Score = 5   This indicates a 7.2% annual risk of stroke. The patient's score is based upon: CHF History: 0 HTN History: 1 Diabetes History: 0 Stroke History: 0 Vascular Disease History: 1 Age Score: 2 Gender Score: 1    Home Medications   Current Meds  Medication Sig   acetaminophen (TYLENOL) 325  MG tablet Take 1-2 tablets (325-650 mg total) by mouth every 4 (four) hours as needed for mild pain (or temp >/= 101 F).   apixaban (ELIQUIS) 5 MG TABS tablet Take 1 tablet by mouth twice daily   atorvastatin (LIPITOR) 40 MG tablet Take 1 tablet (40 mg total) by mouth daily.   baclofen (LIORESAL) 10 MG tablet TAKE 1/2 (ONE-HALF) TABLET BY MOUTH NIGHTLY   furosemide (LASIX) 40 MG tablet Take 1 tablet (40 mg total) by mouth daily.   ipratropium (ATROVENT) 0.06 % nasal spray Place 2 sprays into the nose 2 (two) times daily.   lidocaine (LIDODERM) 5 % Place 1 patch onto the skin daily. Remove & Discard patch within 12 hours or as directed by MD   lisinopril (ZESTRIL) 5 MG tablet Take 1 tablet (5 mg total) by mouth daily.   metoprolol succinate (TOPROL-XL) 100 MG 24 hr tablet Take 1 tablet (100 mg total) by mouth daily. Take with or immediately following a meal.   Multiple Vitamin (MULTIVITAMIN WITH MINERALS) TABS tablet Take 1 tablet by mouth daily.   pantoprazole (PROTONIX) 40 MG tablet Take one tablet twice daily as directed   RESTASIS 0.05 % ophthalmic emulsion Place 1 drop into both eyes 2 (two) times daily as needed (dry eyes).      Review of Systems      All other systems reviewed and are otherwise negative except as  noted above.  Physical Exam    VS:  BP 122/66   Pulse 89   Ht '4\' 11"'$  (1.499 m)   Wt 166 lb (75.3 kg)   BMI 33.53 kg/m  , BMI Body mass index is 33.53 kg/m.  Wt Readings from Last 3 Encounters:  11/04/21 166 lb (75.3 kg)  11/02/21 165 lb 12.8 oz (75.2 kg)  10/08/21 169 lb 8 oz (76.9 kg)     GEN: Well nourished, overweight, well developed, in no acute distress. HEENT: normal. Neck: Supple, no JVD, carotid bruits, or masses. Cardiac: IRIR, no murmurs, rubs, or gallops. No clubbing, cyanosis. Non pitting LE edema.  Radials/PT 2+ and equal bilaterally.  Respiratory:  Respirations regular and unlabored, clear to auscultation bilaterally. GI: Soft, nontender, nondistended. MS: No deformity or atrophy. Skin: Warm and dry, no rash. Neuro:  Strength and sensation are intact. Psych: Normal affect.  Assessment & Plan    HTN - BP well controlled. Continue current antihypertensive regimen lisinopril 5 mg nightly.  Did not tolerate Entresto due to fatigue.  Chronic diastolic heart failure /lower extremity edema -Echo 09/2021 normal LVEF, diastolic parameters unable to be obtained due to atrial fibrillation.  Did not tolerate Entresto due to fatigue.  Presently maintained on Toprol 100 mg daily, Lasix 20 mg daily.  Update BMP, BNP today.  Encouraged to reduce sodium intake, avoid preprepared foods, restrict to less than 2 L of fluid intake per day.  Lower extremity elevation as well as compression socks encouraged.  Pending lab results consider additional 20 mg of Lasix as needed versus addition of low-dose spironolactone daily.  Persistent atrial fibrillation /hypercoagulable state - Rate control improved today.  Continue Toprol 100 mg daily.  Continue Eliquis 5 mg twice daily.  Careful monitoring of renal function to ensure creatinine remains less than 1.5. CHA2DS2-VASc Score = 5 [CHF History: 0, HTN History: 1, Diabetes History: 0, Stroke History: 0, Vascular Disease History: 1, Age Score: 2,  Gender Score: 1].  Therefore, the patient's annual risk of stroke is 7.2 %.  BMI 33 - Referred to PREP exercise program at [facility].  Fatigue -consider etiology deconditioning.  Update CBC, thyroid panel to rule out anemia or hypothyroidism as contributory.  Notes some increase since increased dose of metoprolol.  However, her atrial fibrillation is well controlled and anticipate uncontrolled rate would be more contributory to fatigue.  If needed in future could consider 3-day ZIO monitor to assess rate control.  GERD / Hiatal hernia - Notes some sensation of fullness, indigestion. Add Pepcid '20mg'$  QHS x 1 week then PRN.   PAD -s/p redo right femoropopliteal bypass 11/2019.  Follows with [facility].  No aspirin due to [facility].Continue Atorvastatin.   Disposition: Follow up In October as scheduled with Skeet Latch, MD   Signed, Loel Dubonnet, NP 11/04/2021, 9:11 AM [place]

## 2021-11-05 ENCOUNTER — Telehealth (HOSPITAL_BASED_OUTPATIENT_CLINIC_OR_DEPARTMENT_OTHER): Payer: Self-pay

## 2021-11-05 ENCOUNTER — Telehealth: Payer: Self-pay

## 2021-11-05 ENCOUNTER — Other Ambulatory Visit (HOSPITAL_BASED_OUTPATIENT_CLINIC_OR_DEPARTMENT_OTHER): Payer: Self-pay | Admitting: Family

## 2021-11-05 DIAGNOSIS — R600 Localized edema: Secondary | ICD-10-CM

## 2021-11-05 LAB — COMPREHENSIVE METABOLIC PANEL
ALT: 10 IU/L (ref 0–32)
AST: 17 IU/L (ref 0–40)
Albumin/Globulin Ratio: 2 (ref 1.2–2.2)
Albumin: 4.5 g/dL (ref 3.6–4.6)
Alkaline Phosphatase: 99 IU/L (ref 44–121)
BUN/Creatinine Ratio: 22 (ref 12–28)
BUN: 24 mg/dL (ref 10–36)
Bilirubin Total: 0.4 mg/dL (ref 0.0–1.2)
CO2: 26 mmol/L (ref 20–29)
Calcium: 9.4 mg/dL (ref 8.7–10.3)
Chloride: 100 mmol/L (ref 96–106)
Creatinine, Ser: 1.11 mg/dL — ABNORMAL HIGH (ref 0.57–1.00)
Globulin, Total: 2.2 g/dL (ref 1.5–4.5)
Glucose: 118 mg/dL — ABNORMAL HIGH (ref 70–99)
Potassium: 4 mmol/L (ref 3.5–5.2)
Sodium: 141 mmol/L (ref 134–144)
Total Protein: 6.7 g/dL (ref 6.0–8.5)
eGFR: 46 mL/min/{1.73_m2} — ABNORMAL LOW (ref >59)

## 2021-11-05 LAB — CBC
Hematocrit: 41.3 % (ref 34.0–46.6)
Hemoglobin: 13.7 g/dL (ref 11.1–15.9)
MCH: 28.5 pg (ref 26.6–33.0)
MCHC: 33.2 g/dL (ref 31.5–35.7)
MCV: 86 fL (ref 79–97)
Platelets: 178 10*3/uL (ref 150–450)
RBC: 4.8 x10E6/uL (ref 3.77–5.28)
RDW: 13.2 % (ref 11.7–15.4)
WBC: 10.7 10*3/uL (ref 3.4–10.8)

## 2021-11-05 LAB — THYROID PANEL WITH TSH
Free Thyroxine Index: 2 (ref 1.2–4.9)
T3 Uptake Ratio: 26 % (ref 24–39)
T4, Total: 7.6 ug/dL (ref 4.5–12.0)
TSH: 4.86 u[IU]/mL — ABNORMAL HIGH (ref 0.450–4.500)

## 2021-11-05 LAB — BRAIN NATRIURETIC PEPTIDE: BNP: 212.9 pg/mL — ABNORMAL HIGH (ref 0.0–100.0)

## 2021-11-05 MED ORDER — SPIRONOLACTONE 25 MG PO TABS: 12.5000 mg | ORAL_TABLET | Freq: Every day | ORAL | 3 refills | Status: DC

## 2021-11-05 NOTE — Telephone Encounter (Signed)
Called to discuss PREP program referral, left voicemail  

## 2021-11-05 NOTE — Telephone Encounter (Addendum)
Results called to patient who verbalizes understanding!      ----- Message from Loel Dubonnet, NP sent at 11/05/2021  4:54 PM EDT ----- CBC with no evidence of anemia nor infection.  BNP still with mild volume overload. Thyroid hormone elevated, but other thyroid numbers normal. No thyroid medication indicated at this time. Kidney function improved from previous. Normal liver enzymes, electrolytes.  To reduce fluid levels and swelling - start Spironolactone 12.'5mg'$  QD with BMP in 1 week.

## 2021-11-08 NOTE — Telephone Encounter (Signed)
Per pharmacy note, drug-drug interaction with Lisinopril. Recommend to continue with Spironolactone or something else?

## 2021-11-09 ENCOUNTER — Ambulatory Visit: Payer: Medicare Other | Admitting: Physical Medicine and Rehabilitation

## 2021-11-10 ENCOUNTER — Telehealth: Payer: Self-pay

## 2021-11-10 NOTE — Telephone Encounter (Signed)
Returned her call UP:JSRP program referral, left voicemail.

## 2021-11-12 DIAGNOSIS — R600 Localized edema: Secondary | ICD-10-CM | POA: Diagnosis not present

## 2021-11-13 LAB — BASIC METABOLIC PANEL
BUN/Creatinine Ratio: 20 (ref 12–28)
BUN: 25 mg/dL (ref 10–36)
CO2: 27 mmol/L (ref 20–29)
Calcium: 9.6 mg/dL (ref 8.7–10.3)
Chloride: 100 mmol/L (ref 96–106)
Creatinine, Ser: 1.28 mg/dL — ABNORMAL HIGH (ref 0.57–1.00)
Glucose: 90 mg/dL (ref 70–99)
Potassium: 4.4 mmol/L (ref 3.5–5.2)
Sodium: 144 mmol/L (ref 134–144)
eGFR: 39 mL/min/{1.73_m2} — ABNORMAL LOW (ref >59)

## 2021-11-15 ENCOUNTER — Telehealth (HOSPITAL_BASED_OUTPATIENT_CLINIC_OR_DEPARTMENT_OTHER): Payer: Self-pay

## 2021-11-15 NOTE — Telephone Encounter (Addendum)
Results called to patient who verbalizes understanding!    ----- Message from Loel Dubonnet, NP sent at 11/13/2021 10:03 AM EDT ----- Stable kidney function. Normal electrolytes. Good result! Continue current medications.

## 2021-11-19 ENCOUNTER — Telehealth: Payer: Self-pay

## 2021-11-19 NOTE — Telephone Encounter (Signed)
Able to connect with her today, explained PREP, she would like to attend August 8 class at [facility], every T/Th 10-11:15; assessment visit scheduled for August 1 at 2pm.

## 2021-11-23 NOTE — Progress Notes (Signed)
[facility] PREP Evaluation  Patient Details  Name: [name] [name] MRN: [id number] Date of Birth: 1924/10/18 Age: 88 y.o. PCP: [name] Olp, MD  Vitals:   11/23/21 1449  BP: 122/64  Pulse: 100  SpO2: 96%  Weight: 163 lb 12.8 oz (74.3 kg)     [facility] Eval - 11/23/21 1400       [facility] "PREP" Location   [facility] "PREP" Location [facility]      Referral    Referring Provider [name] [name]    Reason for referral Heart Failure;Hypertension    Program Start Date 11/30/21      Measurement   Waist Circumference 41 inches    Hip Circumference 46 inches      Information for Trainer   Goals --   Improve mobility, strength training, continue to be indpendent   Current Exercise --   resistance bands, hand wts, foot pedal   Orthopedic Concerns --   receives knee gel injections and back (steroid?) injections   Pertinent Medical History --   mult vasc procedures; HTN, CHF   Restrictions/Precautions Assistive device    Medications that affect exercise Beta blocker      Timed Up and Go (TUGS)   Timed Up and Go Moderate risk 10-12 seconds      Mobility and Daily Activities   I find it easy to walk up or down two or more flights of stairs. 1    I have no trouble taking out the trash. 1    I do housework such as vacuuming and dusting on my own without difficulty. 1    I can easily lift a gallon of milk (8lbs). 4    I can easily walk a mile. 1    I have no trouble reaching into high cupboards or reaching down to pick up something from the floor. 2    I do not have trouble doing out-door work such as Armed forces logistics/support/administrative officer, raking leaves, or gardening. 1      Mobility and Daily Activities   I feel younger than my age. 4    I feel independent. 4    I feel energetic. 2    I live an active life.  4    I feel strong. 2    I feel healthy. 2    I feel active as other people my age. 4      How fit and strong are you.   Fit and Strong Total Score 33            Past Medical History:  Diagnosis  Date   Acute on chronic diastolic heart failure ([place]) 12/30/2020   Allergic rhinoconjunctivitis    Allergy    Anxiety    pt. managed- uses deep breathing    Arthritis    low back , stenosis   COLONIC POLYPS, RECURRENT 08/29/2006   2008 last colonoscopy. No further colonoscopy.      COPD (chronic obstructive pulmonary disease) (HCC)    Diverticulosis    Dysrhythmia    afib   Eczema    Environmental allergies    allergy shot- q friday in Dr. Janee [name] office. PFT's abnormal- recommended Spiriva to use preop & will d/c after surgery   GERD (gastroesophageal reflux disease)    Hiatal hernia    Hyperlipidemia    Hypertension    Lung nodule 2011   Paroxysmal atrial fibrillation ([place]) 11/27/2019   Peripheral vascular disease (HCC)    Shingles    Stenosis  of popliteal artery (HCC)    blood clots in legs long ago     Trigeminal neuralgia    Left buttocks   Past Surgical History:  Procedure Laterality Date   ABDOMINAL AORTAGRAM N/A 06/17/2011   Procedure: ABDOMINAL [name] [name];  Surgeon: [name] Dutch, MD;  Location: [facility] CATH LAB;  Service: Cardiovascular;  Laterality: N/A;   ABDOMINAL AORTOGRAM W/LOWER EXTREMITY Bilateral 06/22/2018   Procedure: ABDOMINAL AORTOGRAM W/LOWER EXTREMITY;  Surgeon: [name] Dutch, MD;  Location: [place] CV LAB;  Service: Cardiovascular;  Laterality: Bilateral;   ABDOMINAL AORTOGRAM W/LOWER EXTREMITY Bilateral 11/29/2019   Procedure: ABDOMINAL AORTOGRAM W/LOWER EXTREMITY;  Surgeon: [name] Dutch, MD;  Location: [place] CV LAB;  Service: Cardiovascular;  Laterality: Bilateral;   CATARACT EXTRACTION, BILATERAL     w IOL   CHOLECYSTECTOMY  1998   COLONOSCOPY     ENDARTERECTOMY FEMORAL Left 12/09/2019   Procedure: ENDARTERECTOMY FEMORAL with bovine patch angioplasty.;  Surgeon: [name] Dutch, MD;  Location: [place];  Service: Vascular;  Laterality: Left;   FEMORAL-POPLITEAL BYPASS GRAFT        x2 surgeries 1990's & 2009   FEMORAL-POPLITEAL BYPASS GRAFT  Right 12/09/2019   Procedure: REDO RIGHT FEMORAL-POPLITEAL ARTERY BYPASS GRAFT;  Surgeon: [name] Dutch, MD;  Location: [place];  Service: Vascular;  Laterality: Right;   INJECTION KNEE Right Aug. 2016   Gel injection for pain   LUMBAR FUSION  07/06/2011   TONSILLECTOMY     as a teenager    TUBAL LIGATION     Social History   Tobacco Use  Smoking Status Former   Packs/day: 2.00   Years: 30.00   Total pack years: 60.00   Types: Cigarettes   Quit date: 06/18/1993   Years since quitting: 28.4  Smokeless Tobacco Never  Tobacco Comments   [place]  To begin PREP class at [facility] 8/8, every T/Th 10-11:15  [name] [name] [name] [name] 11/23/2021, 2:57 PM

## 2021-11-25 NOTE — Progress Notes (Deleted)
Chronic Care Management Pharmacy Note  11/25/2021 Name:  [name] [name] MRN:  [id number] DOB:  1925-02-28  Summary: Initial visit with PharmD.  Patient doing well overall main concern is with the copay of Eliquis when she hits the donut hole.  Recommendations/Changes made from today's visit: Informed her to contact me this year when this happens.  Plan: FU 6 months CMA FU 3 months to assess adherence   Subjective: [name] [name] is an 88 y.o. year old female who is a primary patient of Hunter, Brayton Mars, MD.  The CCM team was consulted for assistance with disease management and care coordination needs.    Engaged with patient by telephone for initial visit in response to provider referral for pharmacy case management and/or care coordination services.   Consent to Services:  The patient was given the following information about Chronic Care Management services today, agreed to services, and gave verbal consent: 1. CCM service includes personalized support from designated clinical staff supervised by the primary care provider, including individualized plan of care and coordination with other care providers 2. 24/7 contact phone numbers for assistance for urgent and routine care needs. 3. Service will only be billed when office clinical staff spend 20 minutes or more in a month to coordinate care. 4. Only one practitioner may furnish and bill the service in a calendar month. 5.The patient may stop CCM services at any time (effective at the end of the month) by phone call to the office staff. 6. The patient will be responsible for cost sharing (co-pay) of up to 20% of the service fee (after annual deductible is met). Patient agreed to services and consent obtained.  Patient Care Team: Marin Olp, MD as PCP - General (Family Medicine) Skeet Latch, MD as PCP - Cardiology (Cardiology) Leandrew Koyanagi, MD as Attending Physician (Orthopedic Surgery) Jamse Arn, MD as  Consulting Physician (Physical Medicine and Rehabilitation) Warden Fillers, MD as Consulting Physician (Ophthalmology) Pieter Partridge, DO as Consulting Physician (Neurology) Edythe Clarity, [facility] as Pharmacist (Pharmacist)  Recent office visits:  06/01/2021 OV Allwardt, Randa Evens, PA-C; -She may continue Delsym as she has been doing well with this. Reminded her to keep resting and pushing fluids as well   02/24/2021 OV (PCP) Marin Olp, MD; no medication changes indicated.   Recent consult visits:  05/25/2021 OV (Vasc Surg) Marty Heck, MD; no medication changes indicated.   05/18/2021 OV (Phys Med) Izora Ribas, MD; no medication changes indicated.   05/03/2021 OV (Pulmonology) Martyn Ehrich, NP; no medication changes indicated.   04/29/2021 OV (Cardiology) Ann Maki, Lanice Schwab, NP;  Her Lasix was reduced from 40 mg daily to 20 mg daily in Sept 2022 due to elevated creatinine. Feels that leg edema has been stable since reduction in Lasix.    03/11/2021 OV (Phys Med) Izora Ribas, MD; no medication changes indicated.   03/03/2021 OV (Neurology) Pieter Partridge, DO; Rx prednisone taper   02/23/2021 OV (Allergy Asthma) Jiles Prows, MD;  Pantoprazole 74m - 1 tablet 2 times per day, Ipratropium 0.06% - 2 sprays each nostril 2 times per day   01/19/2021 OV (Pulmonology) SChesley Mires MD; arrange for home sleep study, no medication changes indicated.   01/19/2021 OV (Phys Med) RIzora Ribas MD; Baclofen changed to 5 qhs, Discussed Meloxicam- she will clear with Dr ROval Linsey  12/30/2020 OV (Cardiology) RSkeet Latch MD; no medication changes indicated.   12/22/2020 OV (Orthopedics) XErlinda Hong  Marylynn Pearson, MD; no medication changes indicated.   12/09/2020 OV (Phys Med) Izora Ribas, MD; Baclofen changed to 5 qhs   Hospital visits:  None in previous 6 months   Objective:  Lab Results  Component Value Date   CREATININE 1.28 (H) 11/12/2021    BUN 25 11/12/2021   GFR 40.56 (L) 08/20/2020   EGFR 39 (L) 11/12/2021   GFRNONAA 39 (L) 02/19/2020   GFRAA 46 (L) 02/19/2020   NA 144 11/12/2021   K 4.4 11/12/2021   CALCIUM 9.6 11/12/2021   CO2 27 11/12/2021   GLUCOSE 90 11/12/2021    Lab Results  Component Value Date/Time   HGBA1C 6.1 08/20/2020 11:59 AM   HGBA1C 5.6 02/19/2020 01:47 PM   GFR 40.56 (L) 08/20/2020 11:59 AM   GFR 46.42 (L) 02/28/2019 02:42 PM    Last diabetic Eye exam:  Lab Results  Component Value Date/Time   HMDIABEYEEXA normal 05/29/2008 12:00 AM    Last diabetic Foot exam: No results found for: "HMDIABFOOTEX"   Lab Results  Component Value Date   CHOL 134 01/08/2021   HDL 55 01/08/2021   LDLCALC 56 01/08/2021   LDLDIRECT 46.0 06/10/2019   TRIG 130 01/08/2021   CHOLHDL 2.4 01/08/2021       Latest Ref Rng & Units 11/04/2021   10:17 AM 01/08/2021    9:03 AM 08/20/2020   11:59 AM  Hepatic Function  Total Protein 6.0 - 8.5 g/dL 6.7  6.5  6.6   Albumin 3.6 - 4.6 g/dL 4.5  4.8  4.4   AST 0 - 40 IU/L 17  21  18    ALT 0 - 32 IU/L 10  12  14    Alk Phosphatase 44 - 121 IU/L 99  94  103   Total Bilirubin 0.0 - 1.2 mg/dL 0.4  0.8  0.5     Lab Results  Component Value Date/Time   TSH 4.860 (H) 11/04/2021 10:17 AM   TSH 3.190 11/27/2019 10:26 AM   FREET4 1.36 11/27/2019 10:26 AM   FREET4 0.67 07/01/2013 02:26 PM       Latest Ref Rng & Units 11/04/2021   10:17 AM 04/29/2021    2:44 PM 08/20/2020   11:59 AM  CBC  WBC 3.4 - 10.8 x10E3/uL 10.7  9.8  8.2   Hemoglobin 11.1 - 15.9 g/dL 13.7  13.9  13.2   Hematocrit 34.0 - 46.6 % 41.3  41.4  39.1   Platelets 150 - 450 x10E3/uL 178  199  159.0     Lab Results  Component Value Date/Time   VD25OH 49 09/13/2010 11:50 AM    Clinical ASCVD: Yes  The ASCVD Risk score (Arnett DK, et al., 2019) failed to calculate for the following reasons:   The 2019 ASCVD risk score is only valid for ages 80 to 49       11/02/2021   10:17 AM 09/07/2021   11:16 AM  07/13/2021    1:06 PM  Depression screen PHQ 2/9  Decreased Interest 0 0 0  Down, Depressed, Hopeless 0 0 0  PHQ - 2 Score 0 0 0      Social History   Tobacco Use  Smoking Status Former   Packs/day: 2.00   Years: 30.00   Total pack years: 60.00   Types: Cigarettes   Quit date: 06/18/1993   Years since quitting: 28.4  Smokeless Tobacco Never  Tobacco Comments   QUIT IN 1995   BP Readings from Last 3 Encounters:  11/23/21 122/64  11/04/21 122/66  11/02/21 106/74   Pulse Readings from Last 3 Encounters:  11/23/21 100  11/04/21 89  11/02/21 91   Wt Readings from Last 3 Encounters:  11/23/21 163 lb 12.8 oz (74.3 kg)  11/04/21 166 lb (75.3 kg)  11/02/21 165 lb 12.8 oz (75.2 kg)   BMI Readings from Last 3 Encounters:  11/23/21 33.08 kg/m  11/04/21 33.53 kg/m  11/02/21 33.49 kg/m    Assessment/Interventions: Review of patient past medical history, allergies, medications, health status, including review of consultants reports, laboratory and other test data, was performed as part of comprehensive evaluation and provision of chronic care management services.   SDOH:  (Social Determinants of Health) assessments and interventions performed: Yes  Financial Resource Strain: Low Risk  (01/21/2021)   Overall Financial Resource Strain (CARDIA)    Difficulty of Paying Living Expenses: Not hard at all   Food Insecurity: No Food Insecurity (01/21/2021)   Hunger Vital Sign    Worried About Running Out of Food in the Last Year: Never true    Ran Out of Food in the Last Year: Never true    SDOH Screenings   Alcohol Screen: Not on file  Depression (PHQ2-9): Low Risk  (11/02/2021)   Depression (PHQ2-9)    PHQ-2 Score: 0  Financial Resource Strain: Low Risk  (01/21/2021)   Overall Financial Resource Strain (CARDIA)    Difficulty of Paying Living Expenses: Not hard at all  Food Insecurity: No Food Insecurity (01/21/2021)   Hunger Vital Sign    Worried About Running Out of Food  in the Last Year: Never true    Ran Out of Food in the Last Year: Never true  Housing: Low Risk  (01/21/2021)   Housing    Last Housing Risk Score: 0  Physical Activity: Sufficiently Active (01/21/2021)   Exercise Vital Sign    Days of Exercise per Week: 7 days    Minutes of Exercise per Session: 30 min  Social Connections: Moderately Integrated (01/21/2021)   Social Connection and Isolation Panel [NHANES]    Frequency of Communication with Friends and Family: More than three times a week    Frequency of Social Gatherings with Friends and Family: Twice a week    Attends Religious Services: More than 4 times per year    Active Member of [employer] or Organizations: Yes    Attends Archivist Meetings: 1 to 4 times per year    Marital Status: Widowed  Stress: No Stress Concern Present (01/21/2021)   [place]    Feeling of Stress : Not at all  Tobacco Use: Medium Risk (11/04/2021)   Patient History    Smoking Tobacco Use: Former    Smokeless Tobacco Use: Never    Passive Exposure: Not on file  Transportation Needs: No Transportation Needs (01/21/2021)   PRAPARE - Hydrologist (Medical): No    Lack of Transportation (Non-Medical): No    CCM Care Plan  Allergies  Allergen Reactions   Sulfa Antibiotics Other (See Comments)    Cold sweat light headed and disorientation   Tiotropium Bromide Shortness Of Breath and Other (See Comments)    Sore throat also   Latex Itching and Rash   Ultram [Tramadol] Nausea And Vomiting   5-Alpha Reductase Inhibitors     Medications Reviewed Today     Reviewed by Loel Dubonnet, NP (Nurse Practitioner) on 11/04/21 at 1000  Med  List Status: <None>   Medication Order Taking? Sig Documenting Provider Last Dose Status Informant  acetaminophen (TYLENOL) 325 MG tablet [id number] Yes Take 1-2 tablets (325-650 mg total) by mouth every 4 (four) hours as  needed for mild pain (or temp >/= 101 F). Cathlyn Parsons, PA-C Taking Active   apixaban (ELIQUIS) 5 MG TABS tablet [id number] Yes Take 1 tablet by mouth twice daily Skeet Latch, MD Taking Active   atorvastatin (LIPITOR) 40 MG tablet [id number] Yes Take 1 tablet (40 mg total) by mouth daily. Marin Olp, MD Taking Active   baclofen (LIORESAL) 10 MG tablet [id number] Yes TAKE 1/2 (ONE-HALF) TABLET BY MOUTH NIGHTLY Raulkar, Clide Deutscher, MD Taking Active   famotidine (PEPCID) 20 MG tablet [id number] Yes Take 47m in the evening for 1 week. Then take as needed for indigestion. WLoel Dubonnet NP  Active   furosemide (LASIX) 40 MG tablet [id number] Take 1 tablet (40 mg total) by mouth daily. RSkeet Latch MD Taking Active   ipratropium (ATROVENT) 0.06 % nasal spray [id number] Place 2 sprays into the nose 2 (two) times daily. Kozlow, EDonnamarie Poag MD Taking Active   lidocaine (LIDODERM) 5 % [id number] Place 1 patch onto the skin daily. Remove & Discard patch within 12 hours or as directed by MD Raulkar, KClide Deutscher MD Taking Active   lisinopril (ZESTRIL) 5 MG tablet [id number] Take 1 tablet (5 mg total) by mouth daily. WLoel Dubonnet NP Taking Active   metoprolol succinate (TOPROL-XL) 100 MG 24 hr tablet [id number] Take 1 tablet (100 mg total) by mouth daily. Take with or immediately following a meal. RSkeet Latch MD Taking Active   Multiple Vitamin (MULTIVITAMIN WITH MINERALS) TABS tablet [id number] Take 1 tablet by mouth daily. [provider] Taking Active Self  pantoprazole (PROTONIX) 40 MG tablet [id number] Take one tablet twice daily as directed Kozlow, EDonnamarie Poag MD Taking Active   RESTASIS 0.05 % ophthalmic emulsion [id number] Place 1 drop into both eyes 2 (two) times daily as needed (dry eyes).  [provider] Taking Active Self  Med List Note (Annamaria Boots CKasandra Knudsen MD 08/05/10 2224): Allergy vaccine 1:10 GCypress           Patient Active  Problem List   Diagnosis Date Noted   Mild sleep apnea 05/03/2021   Chronic diastolic heart failure ([place] 12/30/2020   Primary osteoarthritis of right knee 06/02/2020   Left groin hernia 05/22/2020   Sleep disturbance 01/09/2020   Acute lower UTI    Anxiety state    Peripheral edema    Leukocytosis    Debility 12/13/2019   Dyslipidemia    Rectal bleeding    Acute blood loss anemia    Ischemic colitis ([place]    Status post femoral-popliteal bypass surgery 12/09/2019   Persistent atrial fibrillation ([place] 12/03/2019   Secondary hypercoagulable state ([place] 12/03/2019   Chronic bilateral low back pain with right-sided sciatica 08/29/2019   Chronic pain of right knee 08/29/2019   Abnormality of gait 05/30/2019   Neuropathic pain 05/30/2019   Myalgia 08/08/2018   Chronic pain syndrome 06/27/2018   Gross hematuria 06/01/2018   Obesity (BMI 30.0-34.9) 11/14/2017   Failed back syndrome 09/28/2017   CKD (chronic kidney disease), stage III ([place] 08/07/2017   Unilateral primary osteoarthritis, right knee 04/07/2016   Acute gout of left foot 03/22/2016   Pain in right ankle and joints of right foot 08/28/2014   Trigeminal neuralgia  Encounter for therapeutic drug monitoring 10/21/2013   Syncope 12/29/2011   Chronic low back pain 09/13/2010   Allergic rhinitis due to pollen 06/25/2010   COPD mixed type ([place]) 01/22/2010   Solitary pulmonary nodule 01/12/2010   Insulin resistance 01/06/2009   Osteopenia 01/06/2009   Eczema 11/26/2007   Peripheral arterial disease ([place]) 06/29/2007   Hyperlipidemia 10/23/2006   Essential hypertension 10/23/2006   ESOPHAGEAL STRICTURE 08/09/1993    Immunization History  Administered Date(s) Administered   Fluad Quad(high Dose 65+) 01/14/2019, 02/12/2020, 01/21/2021   Influenza Split 12/25/2011   Influenza Whole 01/23/2010   Influenza,inj,Quad PF,6+ Mos 12/31/2012, 12/26/2014, 01/08/2016   Influenza-Unspecified 01/23/2014, 12/25/2017    PFIZER(Purple Top)SARS-COV-2 Vaccination 05/14/2019, 06/03/2019, 01/22/2020, 08/05/2020   PPD Test 06/24/2011   Pneumococcal Conjugate-13 08/31/2015   Pneumococcal Polysaccharide-23 04/25/2005   Td 04/25/2005   Tdap 01/14/2019   Zoster Recombinat (Shingrix) 04/20/2017, 06/29/2017   Zoster, Live 03/02/2010    Conditions to be addressed/monitored:  HTN, Afib, HF, COPD, Trigeminal neuralgia, Osteopenia, HLD, Anxiety  There are no care plans that you recently modified to display for this patient.     Medication Assistance:  Will need assistance with Eliquis when donut hole hits.  Compliance/Adherence/Medication fill history: Care Gaps: None at this time  Star-Rating Drugs: Atorvastatin 40mg  03/15/21 90ds  Patient's preferred pharmacy is:  [facility], [address] [name] [address] Phone: [phone] Fax: [facility] [address] [name] [address] Phone: [phone] Fax: [phone]  Uses pill box? Yes Pt endorses 100% compliance  We discussed: Benefits of medication synchronization, packaging and delivery as well as enhanced pharmacist oversight with [employer]. Patient decided to: Continue current medication management strategy  Care Plan and Follow Up Patient Decision:  Patient agrees to Care Plan and Follow-up.  Plan: The care management team will reach out to the patient again over the next 180 days.  Beverly Milch, PharmD Clinical Pharmacist  [place] [phone]   Current Barriers:  Unable to independently afford treatment regimen Unable to independently monitor therapeutic efficacy  Pharmacist Clinical Goal(s):  Patient will maintain control of BP and cholesterol as evidenced by labs  through collaboration with PharmD and provider.   Interventions: 1:1 collaboration with Marin Olp, MD regarding  development and update of comprehensive plan of care as evidenced by provider attestation and co-signature Inter-disciplinary care team collaboration (see longitudinal plan of care) Comprehensive medication review performed; medication list updated in electronic medical record  Hypertension (BP goal <130/80) -Controlled -Current treatment: Lisinopril 5mg  daily Appropriate, Effective, Safe, Accessible Metoprolol XL 25mg  daily Appropriate, Effective, Safe, Accessible -Medications previously tried: Valsartan, HCTZ  -Current home readings: controlled, has not checked it as much -Current dietary habits: eats what taste good to her -Current exercise habits: She stands up on her toes, does kicks from side to side and rides stationary bike 20 -30 minutes a day. -Denies hypotensive/hypertensive symptoms -Educated on BP goals and benefits of medications for prevention of heart attack, stroke and kidney damage; Importance of home blood pressure monitoring; Symptoms of hypotension and importance of maintaining adequate hydration; -Counseled to monitor BP at home a few times per week, document, and provide log at future appointments -No changes needed at this time, continue active lifestyle  Hyperlipidemia: (LDL goal < 70) -Controlled -Current treatment: Atorvastatin 40mg  daily Appropriate, Effective, Safe, Accessible -Medications previously tried: none noted  -Current dietary patterns: see HTN -  Current exercise habits: see HTN -Educated on Cholesterol goals;  Benefits of statin for ASCVD risk reduction; Importance of limiting foods high in cholesterol; -Recommended to continue current medication Most recent LDL is excellent, continue routine screenings  Atrial Fibrillation (Goal: prevent stroke and major bleeding) -Controlled -Current treatment: Rate control: Metoprolol XL 75m three tablets daily Appropriate, Effective, Safe, Accessible Anticoagulation: Eliquis 530mBID Appropriate,  Effective, Safe, Accessible -Medications previously tried: Warfarin -Home BP and HR readings: "Normal"  -Counseled on increased risk of stroke due to Afib and benefits of anticoagulation for stroke prevention; bleeding risk associated with Eliquis and importance of self-monitoring for signs/symptoms of bleeding; -Recommended to continue current medication Assessed patient finances. She does admit to increased copay recently with Eliquis.  Explained the donut hole and that first fill in 2023 should be back to more affordable price.  Did inform her to contact me this year when donut hole hits so we can apply for patient assistance, patient agrees.  Heart Failure (Goal: manage symptoms and prevent exacerbations) -Controlled -Last ejection fraction: 60-65%  -HF type: Diastolic -Current treatment: Furosemide 2042maily Appropriate, Effective, Safe, Accessible Metoprolol XL 55m88mree tablets daily Appropriate, Effective, Safe, Accessible -Medications previously tried: none noted  -Current home BP/HR readings: normal -Current dietary habits: eats pretty much whatever tastes good to her at the moment -Current exercise habits: see above -Educated on Benefits of medications for managing symptoms and prolonging life Importance of weighing daily; if you gain more than 3 pounds in one day or 5 pounds in one week, contact providers -Recommended to continue current medication Continue to monitor fluid status - euvolemic currently per her reports.   Patient Goals/Self-Care Activities Patient will:  - take medications as prescribed as evidenced by patient report and record review check blood pressure periodically, document, and provide at future appointments  Follow Up Plan: The care management team will reach out to the patient again over the next 180 days.

## 2021-11-30 ENCOUNTER — Telehealth: Payer: Self-pay

## 2021-11-30 NOTE — Telephone Encounter (Signed)
She called to let me know she wasn't feeling good today and will not be able to make the first class; I asked her to touch base with me Thursday to let me know how she was feeling and also let her know that it was fine to postpone attending PREP for now if  she needed to.

## 2021-12-07 ENCOUNTER — Telehealth: Payer: Medicare Other

## 2021-12-07 NOTE — Progress Notes (Signed)
[facility] PREP Weekly Session  Patient Details  Name: CHRISTIANE SISTARE MRN: [id number] Date of Birth: [birth year] Age: 88 y.o. PCP: Marin Olp, MD  Vitals:   12/07/21 1318  Weight: 162 lb 6.4 oz (73.7 kg)     [facility] Weekly seesion - 12/07/21 1300       [facility] "PREP" Location   [facility] "PREP" Location [facility]      Weekly Session   Topic Discussed Other ways to be active;Importance of resistance training   Goal: 150 min/cardio each wk; strength training 2-3 times a wk for 20-40 minutes   Minutes exercised this week 270 minutes    Classes attended to date 2             Illona Bulman B Tj Kitchings 12/07/2021, 1:20 PM

## 2021-12-13 ENCOUNTER — Other Ambulatory Visit: Payer: Self-pay | Admitting: Family Medicine

## 2021-12-14 NOTE — Progress Notes (Signed)
[facility] PREP Weekly Session  Patient Details  Name: [name] [name] MRN: [id number] Date of Birth: [birth year] Age: 88 y.o. PCP: Marin Olp, MD  Vitals:   12/14/21 1139  Weight: 163 lb (73.9 kg)     [facility] Weekly seesion - 12/14/21 1100       [facility] "PREP" Location   [facility] "PREP" Location [facility]      Weekly Session   Topic Discussed Healthy eating tips   Salt 1500-2300 mg/day, 24 gms added sugar, introduced Yuka app; avoid enriched/processed products, avoid frying, enc to eat the rainbow of colors   Minutes exercised this week 90 minutes    Classes attended to date [place] 12/14/2021, 11:39 AM

## 2021-12-20 ENCOUNTER — Other Ambulatory Visit: Payer: Self-pay | Admitting: Physical Medicine and Rehabilitation

## 2021-12-20 ENCOUNTER — Telehealth: Payer: Self-pay

## 2021-12-20 NOTE — Telephone Encounter (Signed)
She called me to let me know she has decided not continue with the PREP classes, feels it is too stressful to come twice a week and she is still struggling with her back pain. Assured her I completely understood and encouraged her to seek options that work with her schedule and lifestyle and do not aggravate her back pain.

## 2021-12-28 ENCOUNTER — Encounter: Payer: Self-pay | Admitting: Physical Medicine and Rehabilitation

## 2021-12-28 ENCOUNTER — Encounter
Payer: Medicare Other | Attending: Physical Medicine and Rehabilitation | Admitting: Physical Medicine and Rehabilitation

## 2021-12-28 VITALS — BP 101/71 | HR 74 | Ht 59.0 in | Wt 163.4 lb

## 2021-12-28 DIAGNOSIS — M5441 Lumbago with sciatica, right side: Secondary | ICD-10-CM | POA: Diagnosis not present

## 2021-12-28 DIAGNOSIS — G8929 Other chronic pain: Secondary | ICD-10-CM | POA: Diagnosis not present

## 2021-12-28 DIAGNOSIS — M791 Myalgia, unspecified site: Secondary | ICD-10-CM | POA: Insufficient documentation

## 2021-12-28 DIAGNOSIS — Z23 Encounter for immunization: Secondary | ICD-10-CM | POA: Diagnosis not present

## 2021-12-28 MED ORDER — LIDOCAINE HCL (PF) 1 % IJ SOLN
3.0000 mL | Freq: Once | INTRAMUSCULAR | Status: AC
  Administered 2021-12-28: 3 mL

## 2021-12-28 MED ORDER — SODIUM CHLORIDE (PF) 0.9 % IJ SOLN
2.0000 mL | Freq: Once | INTRAMUSCULAR | Status: AC
  Administered 2021-12-28: 2 mL via INTRAVENOUS

## 2021-12-28 NOTE — Addendum Note (Signed)
Addended by: Izora Ribas on: 12/28/2021 11:43 AM   Modules accepted: Orders

## 2021-12-28 NOTE — Progress Notes (Signed)
Trigger Point Injection  Indication: Lumbar myofascial pain not relieved by medication management and other conservative care.  Informed consent was obtained after describing risk and benefits of the procedure with the patient, this includes bleeding, bruising, infection and medication side effects.  The patient wishes to proceed and has given written consent.  The patient was placed in a seated position.  The area of pain was marked and prepped with Betadine.  It was entered with a 25-gauge 1/2 inch needle and a total of 5 mL of 1% lidocaine and normal saline was injected into a total of 4 trigger points, after negative draw back for blood.  The patient tolerated the procedure well.  Post procedure instructions were given.     

## 2021-12-29 NOTE — Progress Notes (Signed)
Chronic Care Management Pharmacy Note  01/04/2022 Name:  [name] [name] MRN:  [id number] DOB:  1925/04/21  Summary: PharmD FU.  Recently stopped spironolactone due to unwanted adverse effects.  Reported "brain fog."  BP has remained controlled, does mention some increased swelling but nothing excessive.  Remains active - trying to start some physical therapy.  Remains independent with ADLs.  Recommendations/Changes made from today's visit: May need to monitor K more closely off spironolactone and on 26m Lasix.  Plan: FU 6 months CMA FU 3 months to assess swelling   Subjective: [name] EDDLEMANis an 88y.o. year old female who is a primary patient of Hunter, SBrayton Mars MD.  The CCM team was consulted for assistance with disease management and care coordination needs.    Engaged with patient by telephone for follow up visit in response to provider referral for pharmacy case management and/or care coordination services.   Consent to Services:  The patient was given the following information about Chronic Care Management services today, agreed to services, and gave verbal consent: 1. CCM service includes personalized support from designated clinical staff supervised by the primary care provider, including individualized plan of care and coordination with other care providers 2. 24/7 contact phone numbers for assistance for urgent and routine care needs. 3. Service will only be billed when office clinical staff spend 20 minutes or more in a month to coordinate care. 4. Only one practitioner may furnish and bill the service in a calendar month. 5.The patient may stop CCM services at any time (effective at the end of the month) by phone call to the office staff. 6. The patient will be responsible for cost sharing (co-pay) of up to 20% of the service fee (after annual deductible is met). Patient agreed to services and consent obtained.  Patient Care Team: HMarin Olp MD as PCP - General  (Family Medicine) RSkeet Latch MD as PCP - Cardiology (Cardiology) XLeandrew Koyanagi MD as Attending Physician (Orthopedic Surgery) PJamse Arn MD as Consulting Physician (Physical Medicine and Rehabilitation) GWarden Fillers MD as Consulting Physician (Ophthalmology) JPieter Partridge DO as Consulting Physician (Neurology) DEdythe Clarity R[facility] Centeras Pharmacist (Pharmacist)  Recent office visits:  08/25/2021 OV (PCP) HMarin Olp MD; no medication changes indicated.   Recent consult visits:  12/31/21 ([facility]) - uncommon reaction to spironolactone, patient may have stopped taking.  09/07/2021 [facility] (Physical Medicine) RIzora Ribas MD; no medication changes indicated.   07/13/2021 [facility] (Physical Medicine) RIzora Ribas MD; no medication changes indicated.   07/08/2021 [facility] (Orthopedics) XLeandrew Koyanagi MD; no medication changes indicated.   Hospital visits:  None in previous 6 months   Objective:  Lab Results  Component Value Date   CREATININE 1.28 (H) 11/12/2021   BUN 25 11/12/2021   GFR 40.56 (L) 08/20/2020   EGFR 39 (L) 11/12/2021   GFRNONAA 39 (L) 02/19/2020   GFRAA 46 (L) 02/19/2020   NA 144 11/12/2021   K 4.4 11/12/2021   CALCIUM 9.6 11/12/2021   CO2 27 11/12/2021   GLUCOSE 90 11/12/2021    Lab Results  Component Value Date/Time   HGBA1C 6.1 08/20/2020 11:59 AM   HGBA1C 5.6 02/19/2020 01:47 PM   GFR 40.56 (L) 08/20/2020 11:59 AM   GFR 46.42 (L) 02/28/2019 02:42 PM    Last diabetic Eye exam:  Lab Results  Component Value Date/Time   HMDIABEYEEXA normal 05/29/2008 12:00 AM    Last diabetic Foot exam: No  results found for: "HMDIABFOOTEX"   Lab Results  Component Value Date   CHOL 134 01/08/2021   HDL 55 01/08/2021   LDLCALC 56 01/08/2021   LDLDIRECT 46.0 06/10/2019   TRIG 130 01/08/2021   CHOLHDL 2.4 01/08/2021       Latest Ref Rng & Units 11/04/2021   10:17 AM 01/08/2021    9:03 AM 08/20/2020   11:59 AM  Hepatic  Function  Total Protein 6.0 - 8.5 g/dL 6.7  6.5  6.6   Albumin 3.6 - 4.6 g/dL 4.5  4.8  4.4   AST 0 - 40 IU/L _0 ALT 0 - 32 IU/L _1 Alk Phosphatase 44 - 121 IU/L 99  94  103   Total Bilirubin 0.0 - 1.2 mg/dL 0.4  0.8  0.5     Lab Results  Component Value Date/Time   TSH 4.860 (H) 11/04/2021 10:17 AM   TSH 3.190 11/27/2019 10:26 AM   FREET4 1.36 11/27/2019 10:26 AM   FREET4 0.67 07/01/2013 02:26 PM       Latest Ref Rng & Units 11/04/2021   10:17 AM 04/29/2021    2:44 PM 08/20/2020   11:59 AM  CBC  WBC 3.4 - 10.8 x10E3/uL 10.7  9.8  8.2   Hemoglobin 11.1 - 15.9 g/dL 13.7  13.9  13.2   Hematocrit 34.0 - 46.6 % 41.3  41.4  39.1   Platelets 150 - 450 x10E3/uL 178  199  159.0     Lab Results  Component Value Date/Time   VD25OH 49 09/13/2010 11:50 AM    Clinical ASCVD: Yes  The ASCVD Risk score (Arnett DK, et al., 2019) failed to calculate for the following reasons:   The 2019 ASCVD risk score is only valid for ages 15 to 31       12/28/2021   11:03 AM 11/02/2021   10:17 AM 09/07/2021   11:16 AM  Depression screen PHQ 2/9  Decreased Interest 0 0 0  Down, Depressed, Hopeless 0 0 0  PHQ - 2 Score 0 0 0      Social History   Tobacco Use  Smoking Status Former   Packs/day: 2.00   Years: 30.00   Total pack years: 60.00   Types: Cigarettes   Quit date: 06/18/1993   Years since quitting: 28.5  Smokeless Tobacco Never  Tobacco Comments   QUIT IN 1995   BP Readings from Last 3 Encounters:  12/28/21 101/71  11/23/21 122/64  11/04/21 122/66   Pulse Readings from Last 3 Encounters:  12/28/21 74  11/23/21 100  11/04/21 89   Wt Readings from Last 3 Encounters:  12/28/21 163 lb 6.4 oz (74.1 kg)  12/14/21 163 lb (73.9 kg)  12/07/21 162 lb 6.4 oz (73.7 kg)   BMI Readings from Last 3 Encounters:  12/28/21 33.00 kg/m  12/14/21 32.92 kg/m  12/07/21 32.80 kg/m    Assessment/Interventions: Review of patient past medical history, allergies,  medications, health status, including review of consultants reports, laboratory and other test data, was performed as part of comprehensive evaluation and provision of chronic care management services.   SDOH:  (Social Determinants of Health) assessments and interventions performed: No, done within the year  Financial Resource Strain: Low Risk  (01/21/2021)   Overall Financial Resource Strain (CARDIA)    Difficulty of Paying Living Expenses: Not hard at all   Food Insecurity: No Food Insecurity (01/21/2021)   Hunger Vital Sign  Worried About Charity fundraiser in the Last Year: Never true    [place] in the Last Year: Never true    [place]: No Food Insecurity (01/21/2021)  Housing: Low Risk  (01/21/2021)  Transportation Needs: No Transportation Needs (01/21/2021)  Depression (PHQ2-9): Low Risk  (12/28/2021)  Financial Resource Strain: Low Risk  (01/21/2021)  Physical Activity: Sufficiently Active (01/21/2021)  Social Connections: Moderately Integrated (01/21/2021)  Stress: No Stress Concern Present (01/21/2021)  Tobacco Use: Medium Risk (12/28/2021)    CCM Care Plan  Allergies  Allergen Reactions   Sulfa Antibiotics Other (See Comments)    Cold sweat light headed and disorientation   Tiotropium Bromide Shortness Of Breath and Other (See Comments)    Sore throat also   Latex Itching and Rash   Ultram [Tramadol] Nausea And Vomiting   5-Alpha Reductase Inhibitors     Medications Reviewed Today     Reviewed by Edythe Clarity, [facility] (Pharmacist) on 01/04/22 at 1132  Med List Status: <None>   Medication Order Taking? Sig Documenting Provider Last Dose Status Informant  acetaminophen (TYLENOL) 325 MG tablet [id number] Yes Take 1-2 tablets (325-650 mg total) by mouth every 4 (four) hours as needed for mild pain (or temp >/= 101 F). Cathlyn Parsons, PA-C Taking Active   apixaban (ELIQUIS) 5 MG TABS tablet [id number] Yes Take 1 tablet by mouth twice daily  Skeet Latch, MD Taking Active   atorvastatin (LIPITOR) 40 MG tablet [id number] Yes Take 1 tablet by mouth once daily Marin Olp, MD Taking Active   baclofen (LIORESAL) 10 MG tablet [id number] Yes TAKE 1/2 (ONE-HALF) TABLET BY MOUTH NIGHTLY Raulkar, Clide Deutscher, MD Taking Active   famotidine (PEPCID) 20 MG tablet [id number] Yes Take 45m in the evening for 1 week. Then take as needed for indigestion. WLoel Dubonnet NP Taking Active   furosemide (LASIX) 40 MG tablet [id number] Take 1 tablet (40 mg total) by mouth daily. RSkeet Latch MD Taking Active   ipratropium (ATROVENT) 0.06 % nasal spray [id number] Place 2 sprays into the nose 2 (two) times daily. Kozlow, EDonnamarie Poag MD Taking Active   lidocaine (LIDODERM) 5 % [id number] Place 1 patch onto the skin daily. Remove & Discard patch within 12 hours or as directed by MD Raulkar, KClide Deutscher MD Taking Active   lisinopril (ZESTRIL) 5 MG tablet [id number] Take 1 tablet (5 mg total) by mouth daily. WLoel Dubonnet NP Taking Active   metoprolol succinate (TOPROL-XL) 100 MG 24 hr tablet [id number] Take 1 tablet (100 mg total) by mouth daily. Take with or immediately following a meal. RSkeet Latch MD Taking Active   Multiple Vitamin (MULTIVITAMIN WITH MINERALS) TABS tablet [id number] Take 1 tablet by mouth daily. [provider] Taking Active Self  pantoprazole (PROTONIX) 40 MG tablet [id number] Take one tablet twice daily as directed Kozlow, EDonnamarie Poag MD Taking Active   RESTASIS 0.05 % ophthalmic emulsion [id number] Place 1 drop into both eyes 2 (two) times daily as needed (dry eyes).  [provider] Taking Active Self    Discontinued 12/31/21 1436 (Discontinued by provider)   Med List Note (Annamaria Boots CKasandra Knudsen MD 08/05/10 2224): Allergy vaccine 1:10 [place]           Patient Active Problem List   Diagnosis Date Noted   Mild sleep apnea 05/03/2021   Chronic diastolic heart failure ([place]  12/30/2020   Primary osteoarthritis  of right knee 06/02/2020   Left groin hernia 05/22/2020   Sleep disturbance 01/09/2020   Acute lower UTI    Anxiety state    Peripheral edema    Leukocytosis    Debility 12/13/2019   Dyslipidemia    Rectal bleeding    Acute blood loss anemia    Ischemic colitis ([place])    Status post femoral-popliteal bypass surgery 12/09/2019   Persistent atrial fibrillation ([place]) 12/03/2019   Secondary hypercoagulable state ([place]) 12/03/2019   Chronic bilateral low back pain with right-sided sciatica 08/29/2019   Chronic pain of right knee 08/29/2019   Abnormality of gait 05/30/2019   Neuropathic pain 05/30/2019   Myalgia 08/08/2018   Chronic pain syndrome 06/27/2018   Gross hematuria 06/01/2018   Obesity (BMI 30.0-34.9) 11/14/2017   Failed back syndrome 09/28/2017   CKD (chronic kidney disease), stage III ([place]) 08/07/2017   Unilateral primary osteoarthritis, right knee 04/07/2016   Acute gout of left foot 03/22/2016   Pain in right ankle and joints of right foot 08/28/2014   Trigeminal neuralgia    Encounter for therapeutic drug monitoring 10/21/2013   Syncope 12/29/2011   Chronic low back pain 09/13/2010   Allergic rhinitis due to pollen 06/25/2010   COPD mixed type ([place]) 01/22/2010   Solitary pulmonary nodule 01/12/2010   Insulin resistance 01/06/2009   Osteopenia 01/06/2009   Eczema 11/26/2007   Peripheral arterial disease ([place]) 06/29/2007   Hyperlipidemia 10/23/2006   Essential hypertension 10/23/2006   ESOPHAGEAL STRICTURE 08/09/1993    Immunization History  Administered Date(s) Administered   Fluad Quad(high Dose 65+) 01/14/2019, 02/12/2020, 01/21/2021   Influenza Split 12/25/2011   Influenza Whole 01/23/2010   Influenza,inj,Quad PF,6+ Mos 12/31/2012, 12/26/2014, 01/08/2016   Influenza-Unspecified 01/23/2014, 12/25/2017   PFIZER(Purple Top)SARS-COV-2 Vaccination 05/14/2019, 06/03/2019, 01/22/2020, 08/05/2020   PPD Test 06/24/2011    Pneumococcal Conjugate-13 08/31/2015   Pneumococcal Polysaccharide-23 04/25/2005   Td 04/25/2005   Tdap 01/14/2019   Zoster Recombinat (Shingrix) 04/20/2017, 06/29/2017   Zoster, Live 03/02/2010    Conditions to be addressed/monitored:  HTN, Afib, HF, COPD, Trigeminal neuralgia, Osteopenia, HLD, Anxiety  Care Plan : General Pharmacy (Adult)  Updates made by Edythe Clarity, RPH since 01/04/2022 12:00 AM     Problem: HTN, Afib, HF, HLD, Anxiety   Priority: High  Onset Date: 06/07/2021     Long-Range Goal: Patient-Specific Goal   Start Date: 06/07/2021  Expected End Date: 12/06/2021  Recent Progress: On track  Priority: High  Note:   Current Barriers:  Unable to independently afford treatment regimen Unable to independently monitor therapeutic efficacy  Pharmacist Clinical Goal(s):  Patient will maintain control of BP and cholesterol as evidenced by labs  through collaboration with PharmD and provider.   Interventions: 1:1 collaboration with Marin Olp, MD regarding development and update of comprehensive plan of care as evidenced by provider attestation and co-sign/ature Inter-disciplinary care team collaboration (see longitudinal pla/n of care) Comprehensive medication review performed; medication list updated in electronic medical record  Hypertension (BP goal <130/80) -Controlled, not assessed -Current treatment: Lisinopril 37m daily Appropriate, Effective, Safe, Accessible Metoprolol XL 1043mdaily Appropriate, Effective, Safe, Accessible -Medications previously tried: Valsartan, HCTZ  -Current home readings: controlled, has not checked it as much -Current dietary habits: eats what taste good to her -Current exercise habits: She stands up on her toes, does kicks from side to side and rides stationary bike 20 -30 minutes a day. -Denies hypotensive/hypertensive symptoms -Educated on BP goals and benefits of medications for prevention of  heart attack, stroke and  kidney damage; Importance of home blood pressure monitoring; Symptoms of hypotension and importance of maintaining adequate hydration; -Counseled to monitor BP at home a few times per week, document, and provide log at future appointments -No changes needed at this time, continue active lifestyle  Hyperlipidemia: (LDL goal < 70) -Controlled, not assessed -Current treatment: Atorvastatin 68m daily Appropriate, Effective, Safe, Accessible -Medications previously tried: none noted  -Current dietary patterns: see HTN -Current exercise habits: see HTN -Educated on Cholesterol goals;  Benefits of statin for ASCVD risk reduction; Importance of limiting foods high in cholesterol; -Recommended to continue current medication Most recent LDL is excellent, continue routine screenings  Atrial Fibrillation (Goal: prevent stroke and major bleeding) 01/04/22  -Controlled -Current treatment: Rate control: Metoprolol XL 1033mtablets daily Appropriate, Effective, Safe, Accessible Anticoagulation: Eliquis 24m38mID Appropriate, Effective, Safe, Accessible -Medications previously tried: Warfarin -Home BP and HR readings: see HF  -Counseled on increased risk of stroke due to Afib and benefits of anticoagulation for stroke prevention; bleeding risk associated with Eliquis and importance of self-monitoring for signs/symptoms of bleeding; -Recommended to continue current medication Assessed patient finances. She is currently able to afford Eliquis copay.  At this time no changes are needed and she is having no issues with access. Continue medications as current, no changes needed.  Heart Failure (Goal: manage symptoms and prevent exacerbations) 09/12/ -Controlled -Last ejection fraction: 60-65%  -HF type: Diastolic -Current treatment: Furosemide 20m70mily Appropriate, Effective, Safe, Accessible Metoprolol XL 224mg79mee tablets daily Appropriate, Effective, Safe, Accessible -Medications previously  tried: spironolactone (brain fog) -Current home BP/HR readings: 101/71 yesterday  P 86-109 -Current dietary habits: eats pretty much whatever tastes good to her at the moment -Current exercise habits: see above -Educated on Benefits of medications for managing symptoms and prolonging life Importance of weighing daily; if you gain more than 3 pounds in one day or 5 pounds in one week, contact providers -Recently stopped her spironolactone due to some unwanted adverse effects.  Since stopping the medication she reports she has felt much better and more like herself.  She does admit to some more swelling, however this has not led to increased SOB and is limited to one or two pound weight increases per day. No changes at this time, plans to FU with cardio on spironolactone.  BP remains controlled off spironolactone.  May need to monitor potassium closer since stopping.  Patient Goals/Self-Care Activities Patient will:  - take medications as prescribed as evidenced by patient report and record review check blood pressure periodically, document, and provide at future appointments  Follow Up Plan: The care management team will reach out to the patient again over the next 180 days.            Medication Assistance:  Will need assistance with Eliquis when donut hole hits.  Compliance/Adherence/Medication fill history: Care Gaps: None at this time  Star-Rating Drugs: Atorvastatin 20mg 97m2/23 90ds Lisinopril 24mg 0634m/23 90ds  [facility]  [id number] [id number] [phone] box? Yes Pt endorses 100% compliance  We discussed: Benefits of medication synchronization, packaging and delivery as well as enhanced  pharmacist oversight with [employer]. Patient decided to: Continue current medication management strategy  Care Plan and Follow Up Patient Decision:  Patient agrees to Care Plan and Follow-up.  Plan: The care management team will reach out to the patient again over the next 180 days.  Beverly Milch, PharmD Clinical Pharmacist  [facility] [phone]

## 2021-12-31 ENCOUNTER — Telehealth: Payer: Self-pay | Admitting: Orthopaedic Surgery

## 2021-12-31 ENCOUNTER — Telehealth: Payer: Self-pay | Admitting: Cardiovascular Disease

## 2021-12-31 NOTE — Telephone Encounter (Signed)
Uncommon reaction to Spironolactone. If feels better not taking, reasonable to remain off.  Ensure taking lasix as prescribed and contact us if notes any worsening swelling or shortness of breath.  Follow up as scheduled.   Loel Dubonnet, NP

## 2021-12-31 NOTE — Telephone Encounter (Signed)
Pt c/o medication issue:  1. Name of Medication:   spironolactone (ALDACTONE) 25 MG tablet    2. How are you currently taking this medication (dosage and times per day)? TAKE 1/2 (ONE-HALF) TABLET BY MOUTH ONCE DAILY  3. Are you having a reaction (difficulty breathing--STAT)? No  4. What is your medication issue? Pt states that medication made her sick to her stomach, fuzzy brained and had her to were she couldn't get out of bed. Pt states that she stopped taking medication on 12/22/21 and has been feeling fine since. Please advise

## 2021-12-31 NOTE — Telephone Encounter (Signed)
Pt called and would like to repeat a SynviscOne Inj.   CB [phone]

## 2021-12-31 NOTE — Telephone Encounter (Signed)
Returned call to patient to discuss potential reaction to spironolactone.   Patient states after took for 2-3 days, she said she was sick on her stomach, had no energy, brain fog. Patient stopped taking on the 8/30. Patient states that since she came off it she felt much better.   9/7 104/64 HR 98, she says this has been pretty consistent even without the spironolactone.   Will route to [name], NP for advisement.

## 2021-12-31 NOTE — Telephone Encounter (Signed)
Yes please. Thanks. 

## 2021-12-31 NOTE — Telephone Encounter (Signed)
Returned call to patient and provided the following recommendations, patient verbalizes understanding! Medication list updated!     "Uncommon reaction to Spironolactone. If feels better not taking, reasonable to remain off.  Ensure taking lasix as prescribed and contact us if notes any worsening swelling or shortness of breath.  Follow up as scheduled.    Loel Dubonnet, NP "

## 2022-01-03 NOTE — Telephone Encounter (Signed)
Next available gel injection would need to be after 01/08/2022.  VOB submitted for SynviscOne, right knee

## 2022-01-04 ENCOUNTER — Ambulatory Visit: Payer: Medicare Other | Admitting: Pharmacist

## 2022-01-04 ENCOUNTER — Other Ambulatory Visit: Payer: Self-pay

## 2022-01-04 DIAGNOSIS — I10 Essential (primary) hypertension: Secondary | ICD-10-CM

## 2022-01-04 DIAGNOSIS — I4819 Other persistent atrial fibrillation: Secondary | ICD-10-CM

## 2022-01-04 DIAGNOSIS — I5032 Chronic diastolic (congestive) heart failure: Secondary | ICD-10-CM

## 2022-01-04 NOTE — Patient Instructions (Addendum)
Visit Information   Goals Addressed             This Visit's Progress    Track and Manage Fluids and Swelling-Heart Failure   On track    Timeframe:  Long-Range Goal Priority:  High Start Date:  06/07/21                           Expected End Date:      12/05/21                 Follow Up Date 09/04/21    - call office if I gain more than 2 pounds in one day or 5 pounds in one week - watch for swelling in feet, ankles and legs every day    Why is this important?   It is important to check your weight daily and watch how much salt and liquids you have.  It will help you to manage your heart failure.    Notes: Continue to monitor weight!       Patient Care Plan: General Pharmacy (Adult)     Problem Identified: HTN, Afib, HF, HLD, Anxiety   Priority: High  Onset Date: 06/07/2021     Long-Range Goal: Patient-Specific Goal   Start Date: 06/07/2021  Expected End Date: 12/06/2021  Recent Progress: On track  Priority: High  Note:   Current Barriers:  Unable to independently afford treatment regimen Unable to independently monitor therapeutic efficacy  Pharmacist Clinical Goal(s):  Patient will maintain control of BP and cholesterol as evidenced by labs  through collaboration with PharmD and provider.   Interventions: 1:1 collaboration with Marin Olp, MD regarding development and update of comprehensive plan of care as evidenced by provider attestation and co-sign/ature Inter-disciplinary care team collaboration (see longitudinal pla/n of care) Comprehensive medication review performed; medication list updated in electronic medical record  Hypertension (BP goal <130/80) -Controlled, not assessed -Current treatment: Lisinopril '5mg'$  daily Appropriate, Effective, Safe, Accessible Metoprolol XL '100mg'$  daily Appropriate, Effective, Safe, Accessible -Medications previously tried: Valsartan, HCTZ  -Current home readings: controlled, has not checked it as much -Current  dietary habits: eats what taste good to her -Current exercise habits: She stands up on her toes, does kicks from side to side and rides stationary bike 20 -30 minutes a day. -Denies hypotensive/hypertensive symptoms -Educated on BP goals and benefits of medications for prevention of heart attack, stroke and kidney damage; Importance of home blood pressure monitoring; Symptoms of hypotension and importance of maintaining adequate hydration; -Counseled to monitor BP at home a few times per week, document, and provide log at future appointments -No changes needed at this time, continue active lifestyle  Hyperlipidemia: (LDL goal < 70) -Controlled, not assessed -Current treatment: Atorvastatin '40mg'$  daily Appropriate, Effective, Safe, Accessible -Medications previously tried: none noted  -Current dietary patterns: see HTN -Current exercise habits: see HTN -Educated on Cholesterol goals;  Benefits of statin for ASCVD risk reduction; Importance of limiting foods high in cholesterol; -Recommended to continue current medication Most recent LDL is excellent, continue routine screenings  Atrial Fibrillation (Goal: prevent stroke and major bleeding) 01/04/22  -Controlled -Current treatment: Rate control: Metoprolol XL '100mg'$  tablets daily Appropriate, Effective, Safe, Accessible Anticoagulation: Eliquis '5mg'$  BID Appropriate, Effective, Safe, Accessible -Medications previously tried: Warfarin -Home BP and HR readings: see HF  -Counseled on increased risk of stroke due to Afib and benefits of anticoagulation for stroke prevention; bleeding risk associated with Eliquis and importance of self-monitoring  for signs/symptoms of bleeding; -Recommended to continue current medication Assessed patient finances. She is currently able to afford Eliquis copay.  At this time no changes are needed and she is having no issues with access. Continue medications as current, no changes needed.  Heart Failure (Goal:  manage symptoms and prevent exacerbations) 09/12/ -Controlled -Last ejection fraction: 60-65%  -HF type: Diastolic -Current treatment: Furosemide '40mg'$  daily Appropriate, Effective, Safe, Accessible Metoprolol XL '25mg'$  three tablets daily Appropriate, Effective, Safe, Accessible -Medications previously tried: spironolactone (brain fog) -Current home BP/HR readings: 101/71 yesterday  P 86-109 -Current dietary habits: eats pretty much whatever tastes good to her at the moment -Current exercise habits: see above -Educated on Benefits of medications for managing symptoms and prolonging life Importance of weighing daily; if you gain more than 3 pounds in one day or 5 pounds in one week, contact providers -Recently stopped her spironolactone due to some unwanted adverse effects.  Since stopping the medication she reports she has felt much better and more like herself.  She does admit to some more swelling, however this has not led to increased SOB and is limited to one or two pound weight increases per day. No changes at this time, plans to FU with cardio on spironolactone.  BP remains controlled off spironolactone.  May need to monitor potassium closer since stopping.  Patient Goals/Self-Care Activities Patient will:  - take medications as prescribed as evidenced by patient report and record review check blood pressure periodically, document, and provide at future appointments  Follow Up Plan: The care management team will reach out to the patient again over the next 180 days.           The patient verbalized understanding of instructions, educational materials, and care plan provided today and DECLINED offer to receive copy of patient instructions, educational materials, and care plan.  Telephone follow up appointment with pharmacy team member scheduled for: 6 month call  Edythe Clarity, [place], PharmD Clinical Pharmacist  [facility] [phone]

## 2022-01-05 ENCOUNTER — Ambulatory Visit: Payer: Medicare Other | Attending: Physical Medicine and Rehabilitation

## 2022-01-05 DIAGNOSIS — R252 Cramp and spasm: Secondary | ICD-10-CM | POA: Diagnosis not present

## 2022-01-05 DIAGNOSIS — G8929 Other chronic pain: Secondary | ICD-10-CM | POA: Diagnosis not present

## 2022-01-05 DIAGNOSIS — R2689 Other abnormalities of gait and mobility: Secondary | ICD-10-CM | POA: Insufficient documentation

## 2022-01-05 DIAGNOSIS — M791 Myalgia, unspecified site: Secondary | ICD-10-CM | POA: Insufficient documentation

## 2022-01-05 DIAGNOSIS — M545 Low back pain, unspecified: Secondary | ICD-10-CM | POA: Insufficient documentation

## 2022-01-05 DIAGNOSIS — M6281 Muscle weakness (generalized): Secondary | ICD-10-CM | POA: Diagnosis not present

## 2022-01-05 NOTE — Therapy (Signed)
OUTPATIENT PHYSICAL THERAPY NEURO EVALUATION   Patient Name: [name] [name] MRN: [id number] DOB:[birth year], 88 y.o., female Today's Date: 01/05/2022   PCP: Marin Olp, MD REFERRING PROVIDER: Izora Ribas, MD    PT End of Session - 01/05/22 1316     Visit Number 1    Authorization Type Medicare A/B; BCBS Supplemental 2023    PT Start Time 1740    PT Stop Time 1400    PT Time Calculation (min) 45 min             Past Medical History:  Diagnosis Date   Acute on chronic diastolic heart failure ([place]) 12/30/2020   Allergic rhinoconjunctivitis    Allergy    Anxiety    pt. managed- uses deep breathing    Arthritis    low back , stenosis   COLONIC POLYPS, RECURRENT 08/29/2006   2008 last colonoscopy. No further colonoscopy.      COPD (chronic obstructive pulmonary disease) (HCC)    Diverticulosis    Dysrhythmia    afib   Eczema    Environmental allergies    allergy shot- q friday in Dr. Janee Morn office. PFT's abnormal- recommended Spiriva to use preop & will d/c after surgery   GERD (gastroesophageal reflux disease)    Hiatal hernia    Hyperlipidemia    Hypertension    Lung nodule 2011   Paroxysmal atrial fibrillation ([place]) 11/27/2019   Peripheral vascular disease (HCC)    Shingles    Stenosis of popliteal artery (HCC)    blood clots in legs long ago     Trigeminal neuralgia    Left buttocks   Past Surgical History:  Procedure Laterality Date   ABDOMINAL AORTAGRAM N/A 06/17/2011   Procedure: ABDOMINAL Maxcine Ham;  Surgeon: Elam Dutch, MD;  Location: [facility] CATH LAB;  Service: Cardiovascular;  Laterality: N/A;   ABDOMINAL AORTOGRAM W/LOWER EXTREMITY Bilateral 06/22/2018   Procedure: ABDOMINAL AORTOGRAM W/LOWER EXTREMITY;  Surgeon: Elam Dutch, MD;  Location: [place] CV LAB;  Service: Cardiovascular;  Laterality: Bilateral;   ABDOMINAL AORTOGRAM W/LOWER EXTREMITY Bilateral 11/29/2019   Procedure: ABDOMINAL AORTOGRAM W/LOWER EXTREMITY;  Surgeon:  Elam Dutch, MD;  Location: [place] CV LAB;  Service: Cardiovascular;  Laterality: Bilateral;   CATARACT EXTRACTION, BILATERAL     w IOL   CHOLECYSTECTOMY  1998   COLONOSCOPY     ENDARTERECTOMY FEMORAL Left 12/09/2019   Procedure: ENDARTERECTOMY FEMORAL with bovine patch angioplasty.;  Surgeon: Elam Dutch, MD;  Location: [place];  Service: Vascular;  Laterality: Left;   FEMORAL-POPLITEAL BYPASS GRAFT        x2 surgeries 1990's & 2009   FEMORAL-POPLITEAL BYPASS GRAFT Right 12/09/2019   Procedure: REDO RIGHT FEMORAL-POPLITEAL ARTERY BYPASS GRAFT;  Surgeon: Elam Dutch, MD;  Location: [place];  Service: Vascular;  Laterality: Right;   INJECTION KNEE Right Aug. 2016   Gel injection for pain   LUMBAR FUSION  07/06/2011   TONSILLECTOMY     as a teenager    TUBAL LIGATION     Patient Active Problem List   Diagnosis Date Noted   Mild sleep apnea 05/03/2021   Chronic diastolic heart failure ([place]) 12/30/2020   Primary osteoarthritis of right knee 06/02/2020   Left groin hernia 05/22/2020   Sleep disturbance 01/09/2020   Acute lower UTI    Anxiety state    Peripheral edema    Leukocytosis    Debility 12/13/2019   Dyslipidemia    Rectal bleeding  Acute blood loss anemia    Ischemic colitis ([place])    Status post femoral-popliteal bypass surgery 12/09/2019   Persistent atrial fibrillation ([place]) 12/03/2019   Secondary hypercoagulable state ([place]) 12/03/2019   Chronic bilateral low back pain with right-sided sciatica 08/29/2019   Chronic pain of right knee 08/29/2019   Abnormality of gait 05/30/2019   Neuropathic pain 05/30/2019   Myalgia 08/08/2018   Chronic pain syndrome 06/27/2018   Gross hematuria 06/01/2018   Obesity (BMI 30.0-34.9) 11/14/2017   Failed back syndrome 09/28/2017   CKD (chronic kidney disease), stage III ([place]) 08/07/2017   Unilateral primary osteoarthritis, right knee 04/07/2016   Acute gout of left foot 03/22/2016   Pain in right ankle and joints  of right foot 08/28/2014   Trigeminal neuralgia    Encounter for therapeutic drug monitoring 10/21/2013   Syncope 12/29/2011   Chronic low back pain 09/13/2010   Allergic rhinitis due to pollen 06/25/2010   COPD mixed type ([place]) 01/22/2010   Solitary pulmonary nodule 01/12/2010   Insulin resistance 01/06/2009   Osteopenia 01/06/2009   Eczema 11/26/2007   Peripheral arterial disease ([place]) 06/29/2007   Hyperlipidemia 10/23/2006   Essential hypertension 10/23/2006   ESOPHAGEAL STRICTURE 08/09/1993    ONSET DATE: "years"  REFERRING DIAG: M79.10 (ICD-10-CM) - Myalgia   THERAPY DIAG:  Chronic bilateral low back pain without sciatica  Cramp and spasm  Muscle weakness (generalized)  Rationale for Evaluation and Treatment Rehabilitation  SUBJECTIVE:                                                                                                                                                                                              SUBJECTIVE STATEMENT: Pt notes chronic low back pain and has hx of lumbar fusion and recently received trigger point injections which provides about 1 week relief. Pt notes pain in back in AM with difficulty getting going. Pt reports past experience with therapy "which helped tremendously". Notes sleep disruption due to back pain  Pt accompanied by: self  PERTINENT HISTORY: hx of lumbar fusion  PAIN:  Are you having pain? Yes: NPRS scale: 5/10 Pain location: along lumbar spine "below belt line" Pain description: ache, sore Aggravating factors: first thing in the AM, static standing like cooking/washing dishes Relieving factors: sitting  PRECAUTIONS: None  WEIGHT BEARING RESTRICTIONS No  FALLS: Has patient fallen in last 6 months? No  LIVING ENVIRONMENT: Lives with: lives alone Lives in: House/apartment Stairs: No Has following equipment at home: Single point cane  PLOF: Independent and Independent with community mobility with  device  PATIENT GOALS decrease pain.   OBJECTIVE:  DIAGNOSTIC FINDINGS:  IMPRESSION: 1. L4-5 PLIF with resection of previously seen disc material. No residual spinal canal stenosis. 2. Unchanged moderate bilateral neural foraminal stenosis at L2-3 and L3-4. 3. Progression of severe right T11-12 and T12-L1 neural foraminal stenosis.  COGNITION: Overall cognitive status: Within functional limits for tasks assessed   SENSATION: WFL, denies parasthesias  COORDINATION: WNL  EDEMA:    MUSCLE TONE: WNL  Palpation:  lower thoracic/high lumbar paraspinals very tight and tender to palpation, decrease in lumbar lordosis notable   MUSCLE LENGTH: Left hamstrings tight and restricted vs right. Left SLR 35 degrees vs RLE SLR of 50-60 deg  DTRs:  DNT  POSTURE: No Significant postural limitations Side-sleeper, typically on the left    LOWER EXTREMITY ROM:    WFL  Lumbar ROM: flexion WFL     Extension grossly limited     Sidebending symmetrical 10% limited  LOWER EXTREMITY MMT:    MMT Right Eval Left Eval  Hip flexion 4 4  Hip extension    Hip abduction 4 4  Hip adduction    Hip internal rotation    Hip external rotation    Knee flexion 5 5  Knee extension 5 5  Ankle dorsiflexion 5 5  Ankle plantarflexion    Ankle inversion    Ankle eversion    (Blank rows = not tested)  Trunk flexion: 2+/5   BED MOBILITY:  Modified independent, increased time  TRANSFERS: Assistive device utilized: None  Sit to stand: Complete Independence Stand to sit: Complete Independence Chair to chair: Complete Independence Floor:  DNT    CURB:  Level of Assistance: Modified independence Assistive device utilized: Single point cane Curb Comments:     GAIT: Gait pattern: WFL Distance walked: pt report she is able to walk grocery store distances using shopping cart for support Assistive device utilized: Single point cane and None Level of assistance: Complete  Independence and Modified independence Comments: reports use of cane for community distances, no AD for household use  FUNCTIONAL TESTs:  6 minute walk test: NT Berg Balance Scale: NT  PATIENT SURVEYS:    TODAY'S TREATMENT:  Evaluation, education, HEP initiation   PATIENT EDUCATION: Education details: use of anti-fatigue mats, support pillow for sleeping (pregnancy pillow), assessment findings Person educated: Patient Education method: Explanation and Handouts Education comprehension: needs further education   HOME EXERCISE PROGRAM: Access Code: [email or web address] URL: [email or web address] Date: 01/05/2022 Prepared by: Sherlyn Lees  Exercises - Supine Posterior Pelvic Tilt  - 1 x daily - 7 x weekly - 1-3 sets - 10 reps - 3 sec hold    GOALS: Goals reviewed with patient? Yes  SHORT TERM GOALS: Target date: 01/19/2022  Patient will be independent in HEP to improve functional outcomes with emphasis on core strength and hamstring flexibility Baseline: Goal status: INITIAL    LONG TERM GOALS: Target date: 02/02/2022  Pt to report successful positions/strategies for sleeping posture to improve quality of sleep and reduce back pain Baseline:  Goal status: INITIAL  2.  Demo trunk flexion strength 3-/5 for improve stability Baseline: 2+/5  Goal status: INITIAL  3.  Report improved standing tolerance with use of anti-fatigue mat or other adaptations x 20 min to improve efficiency of housekeeping tasks Baseline: 5-10 min Goal status: INITIAL  4.  Pt to report/demonstrate 3 strategies for self-management of chronic low back pain to improve comfort and QOL.  Baseline: main strategy at present is seated rest period Goal status: INITIAL   ASSESSMENT:  CLINICAL IMPRESSION:  Patient is a 88 y.o. lady who was seen today for physical therapy evaluation and treatment for chronic LBP. Demonstrates tenderness to palpation in lumbar musculature, reduced abdominal  strength, limited hamstring flexibility, and reduced activity tolerance with pain limiting her sleep time/quality. Pt would benefit from PT services to develop restorative, adaptive, and compensatory strategies to reduce pain-limited deficits and improve activity tolerance and quality of life.    OBJECTIVE IMPAIRMENTS decreased activity tolerance, decreased endurance, difficulty walking, decreased strength, impaired perceived functional ability, increased muscle spasms, impaired flexibility, improper body mechanics, postural dysfunction, and pain.   ACTIVITY LIMITATIONS carrying, standing, squatting, sleeping, bed mobility, and locomotion level  PARTICIPATION LIMITATIONS: meal prep, cleaning, laundry, and community activity  PERSONAL FACTORS Age, Time since onset of injury/illness/exacerbation, and 1 comorbidity: hx of lumbar fusion  are also affecting patient's functional outcome.   REHAB POTENTIAL: Good  CLINICAL DECISION MAKING: Stable/uncomplicated  EVALUATION COMPLEXITY: Low  PLAN: PT FREQUENCY: 1-2x/week  PT DURATION: 4 weeks  PLANNED INTERVENTIONS: Therapeutic exercises, Therapeutic activity, Neuromuscular re-education, Balance training, Gait training, Patient/Family education, Self Care, Joint mobilization, Stair training, Vestibular training, Canalith repositioning, Orthotic/Fit training, DME instructions, Aquatic Therapy, Electrical stimulation, Spinal mobilization, Cryotherapy, Moist heat, Taping, Traction, Ionotophoresis '4mg'$ /ml Dexamethasone, and Manual therapy  PLAN FOR NEXT SESSION: core stabilization, hamstring stretching, soft-tissue mobilization to paraspinals?   5:34 PM, 01/05/22 M. Sherlyn Lees, PT, DPT Physical Therapist- [place] Office Number: [phone]

## 2022-01-10 ENCOUNTER — Other Ambulatory Visit: Payer: Self-pay | Admitting: Cardiovascular Disease

## 2022-01-10 DIAGNOSIS — I4819 Other persistent atrial fibrillation: Secondary | ICD-10-CM

## 2022-01-10 NOTE — Telephone Encounter (Signed)
Prescription refill request for Eliquis received. Indication:  Afib  Last office visit: 11/04/21 Gilford Rile)  Scr: 1.28 (11/12/21) Age: 88 Weight: 74.1kg  Appropriate dose and refill sent to requested pharmacy.

## 2022-01-10 NOTE — Telephone Encounter (Signed)
Please review for refill. Thank you! 

## 2022-01-11 ENCOUNTER — Encounter: Payer: Self-pay | Admitting: Physical Therapy

## 2022-01-11 ENCOUNTER — Ambulatory Visit: Payer: Medicare Other | Admitting: Physical Therapy

## 2022-01-11 DIAGNOSIS — M545 Low back pain, unspecified: Secondary | ICD-10-CM | POA: Diagnosis not present

## 2022-01-11 DIAGNOSIS — M6281 Muscle weakness (generalized): Secondary | ICD-10-CM

## 2022-01-11 DIAGNOSIS — R252 Cramp and spasm: Secondary | ICD-10-CM | POA: Diagnosis not present

## 2022-01-11 DIAGNOSIS — M791 Myalgia, unspecified site: Secondary | ICD-10-CM | POA: Diagnosis not present

## 2022-01-11 DIAGNOSIS — G8929 Other chronic pain: Secondary | ICD-10-CM

## 2022-01-11 DIAGNOSIS — R2689 Other abnormalities of gait and mobility: Secondary | ICD-10-CM | POA: Diagnosis not present

## 2022-01-11 NOTE — Therapy (Signed)
OUTPATIENT PHYSICAL THERAPY TREATMENT NOTE   Patient Name: [name] [name] MRN: [id number] DOB:1924-06-02, 88 y.o., female Today's Date: 01/11/2022  PCP:  Marin Olp, MD REFERRING PROVIDER: Izora Ribas, MD   END OF SESSION:   PT End of Session - 01/11/22 1021     Visit Number 2    Number of Visits 8    Date for PT Re-Evaluation 02/02/22    Authorization Type Medicare A/B; BCBS Supplemental 2023    PT Start Time 1020    PT Stop Time 1101    PT Time Calculation (min) 41 min    Activity Tolerance Patient tolerated treatment well    Behavior During Therapy WFL for tasks assessed/performed             Past Medical History:  Diagnosis Date   Acute on chronic diastolic heart failure ([place]) 12/30/2020   Allergic rhinoconjunctivitis    Allergy    Anxiety    pt. managed- uses deep breathing    Arthritis    low back , stenosis   COLONIC POLYPS, RECURRENT 08/29/2006   2008 last colonoscopy. No further colonoscopy.      COPD (chronic obstructive pulmonary disease) (HCC)    Diverticulosis    Dysrhythmia    afib   Eczema    Environmental allergies    allergy shot- q friday in Dr. Janee Morn office. PFT's abnormal- recommended Spiriva to use preop & will d/c after surgery   GERD (gastroesophageal reflux disease)    Hiatal hernia    Hyperlipidemia    Hypertension    Lung nodule 2011   Paroxysmal atrial fibrillation ([place]) 11/27/2019   Peripheral vascular disease (HCC)    Shingles    Stenosis of popliteal artery (HCC)    blood clots in legs long ago     Trigeminal neuralgia    Left buttocks   Past Surgical History:  Procedure Laterality Date   ABDOMINAL AORTAGRAM N/A 06/17/2011   Procedure: ABDOMINAL Maxcine Ham;  Surgeon: Elam Dutch, MD;  Location: [facility] CATH LAB;  Service: Cardiovascular;  Laterality: N/A;   ABDOMINAL AORTOGRAM W/LOWER EXTREMITY Bilateral 06/22/2018   Procedure: ABDOMINAL AORTOGRAM W/LOWER EXTREMITY;  Surgeon: Elam Dutch, MD;  Location: [place] [facility];  Service: Cardiovascular;  Laterality: Bilateral;   ABDOMINAL AORTOGRAM W/LOWER EXTREMITY Bilateral 11/29/2019   Procedure: ABDOMINAL AORTOGRAM W/LOWER EXTREMITY;  Surgeon: Elam Dutch, MD;  Location: [place] CV LAB;  Service: Cardiovascular;  Laterality: Bilateral;   CATARACT EXTRACTION, BILATERAL     w IOL   CHOLECYSTECTOMY  1998   COLONOSCOPY     ENDARTERECTOMY FEMORAL Left 12/09/2019   Procedure: ENDARTERECTOMY FEMORAL with bovine patch angioplasty.;  Surgeon: Elam Dutch, MD;  Location: [place];  Service: Vascular;  Laterality: Left;   FEMORAL-POPLITEAL BYPASS GRAFT        x2 surgeries 1990's & 2009   FEMORAL-POPLITEAL BYPASS GRAFT Right 12/09/2019   Procedure: REDO RIGHT FEMORAL-POPLITEAL ARTERY BYPASS GRAFT;  Surgeon: Elam Dutch, MD;  Location: [place];  Service: Vascular;  Laterality: Right;   INJECTION KNEE Right Aug. 2016   Gel injection for pain   LUMBAR FUSION  07/06/2011   TONSILLECTOMY     as a teenager    TUBAL LIGATION     Patient Active Problem List   Diagnosis Date Noted   Mild sleep apnea 05/03/2021   Chronic diastolic heart failure ([place]) 12/30/2020   Primary osteoarthritis of right knee 06/02/2020   Left groin hernia 05/22/2020  Sleep disturbance 01/09/2020   Acute lower UTI    Anxiety state    Peripheral edema    Leukocytosis    Debility 12/13/2019   Dyslipidemia    Rectal bleeding    Acute blood loss anemia    Ischemic colitis ([place])    Status post femoral-popliteal bypass surgery 12/09/2019   Persistent atrial fibrillation ([place]) 12/03/2019   Secondary hypercoagulable state ([place]) 12/03/2019   Chronic bilateral low back pain with right-sided sciatica 08/29/2019   Chronic pain of right knee 08/29/2019   Abnormality of gait 05/30/2019   Neuropathic pain 05/30/2019   Myalgia 08/08/2018   Chronic pain syndrome 06/27/2018   Gross hematuria 06/01/2018   Obesity (BMI 30.0-34.9) 11/14/2017   Failed back syndrome 09/28/2017    CKD (chronic kidney disease), stage III ([place]) 08/07/2017   Unilateral primary osteoarthritis, right knee 04/07/2016   Acute gout of left foot 03/22/2016   Pain in right ankle and joints of right foot 08/28/2014   Trigeminal neuralgia    Encounter for therapeutic drug monitoring 10/21/2013   Syncope 12/29/2011   Chronic low back pain 09/13/2010   Allergic rhinitis due to pollen 06/25/2010   COPD mixed type ([place]) 01/22/2010   Solitary pulmonary nodule 01/12/2010   Insulin resistance 01/06/2009   Osteopenia 01/06/2009   Eczema 11/26/2007   Peripheral arterial disease ([place]) 06/29/2007   Hyperlipidemia 10/23/2006   Essential hypertension 10/23/2006   ESOPHAGEAL STRICTURE 08/09/1993    REFERRING DIAG: M79.10 (ICD-10-CM) - Myalgia   THERAPY DIAG:  Chronic bilateral low back pain without sciatica  Muscle weakness (generalized)  Rationale for Evaluation and Treatment Rehabilitation  PERTINENT HISTORY:  hx of lumbar fusion  PRECAUTIONS: None  SUBJECTIVE: Today is a mild day for back pain for me.  Favoring R knee this morning  PAIN:  Are you having pain? Yes: NPRS scale: 2-3/10 Pain location: back pain Pain description: constant Aggravating factors: damp weather  Relieving factors: movement alleviates   OBJECTIVE:   TODAY'S TREATMENT: 01/11/2022 Activity Comments  Reviewed supine pelvic tilts, 3 sets x 5 reps Tactile and verbal cues "roll hips forward/roll hips back"  SKTC 2 x 15-20 sec hold    Lower trunk rotation, 3 x 15 seconds   Open book stretch, 4 reps, 30 seconds   Seated ant/posterior pelvic tilts, 2 x 5 reps Tactile cues  Forward trunk lean to upright posture, 2 x 5 reps   Standing lateral weightshifting x 10 reps; stagger stance forward/back weightshifting x 10 reps Standing at counter  NuStep, Level 2, 4 extremities x 5 minutes For flexibility and strength   Initial cues for technique for all exercises Access Code: [email or web address] URL:  [email or web address] Date: 01/11/2022 Prepared by: [facility]  Exercises - Supine Posterior Pelvic Tilt  - 1 x daily - 7 x weekly - 1-3 sets - 10 reps - 3 sec hold - Hooklying Single Knee to Chest Stretch  - 1 x daily - 7 x weekly - 1 sets - 3 reps - 15-30 sec hold - Supine Lower Trunk Rotation  - 1 x daily - 7 x weekly - 3 sets - 10 reps - 15-30 sec hold - Sidelying Thoracic Lumbar Rotation  - 1 x daily - 7 x weekly - 1 sets - 3 reps - 15-30 sec hold - Staggered Stance Forward Backward Weight Shift with Counter Support  - 1-2 x daily - 7 x weekly - 1-2 sets - 10 reps  PATIENT EDUCATION: Education  details: Additions to HEP Person educated: Patient Education method: Explanation, Demonstration, and Handouts Education comprehension: verbalized understanding, returned demonstration, and needs further education  ------------------------------------------------------------------------------- (objective measures completed at initial evaluation unless otherwise dated)   DIAGNOSTIC FINDINGS:  IMPRESSION: 1. L4-5 PLIF with resection of previously seen disc material. No residual spinal canal stenosis. 2. Unchanged moderate bilateral neural foraminal stenosis at L2-3 and L3-4. 3. Progression of severe right T11-12 and T12-L1 neural foraminal stenosis.   COGNITION: Overall cognitive status: Within functional limits for tasks assessed             SENSATION: WFL, denies parasthesias   COORDINATION: WNL   EDEMA:      MUSCLE TONE: WNL   Palpation:  lower thoracic/high lumbar paraspinals very tight and tender to palpation, decrease in lumbar lordosis notable     MUSCLE LENGTH: Left hamstrings tight and restricted vs right. Left SLR 35 degrees vs RLE SLR of 50-60 deg   DTRs:  DNT   POSTURE: No Significant postural limitations Side-sleeper, typically on the left      LOWER EXTREMITY ROM:    WFL   Lumbar ROM: flexion WFL                          Extension grossly limited                         Sidebending symmetrical 10% limited   LOWER EXTREMITY MMT:     MMT Right Eval Left Eval  Hip flexion 4 4  Hip extension      Hip abduction 4 4  Hip adduction      Hip internal rotation      Hip external rotation      Knee flexion 5 5  Knee extension 5 5  Ankle dorsiflexion 5 5  Ankle plantarflexion      Ankle inversion      Ankle eversion      (Blank rows = not tested)   Trunk flexion: 2+/5     BED MOBILITY:  Modified independent, increased time   TRANSFERS: Assistive device utilized: None  Sit to stand: Complete Independence Stand to sit: Complete Independence Chair to chair: Complete Independence Floor:  DNT       CURB:  Level of Assistance: Modified independence Assistive device utilized: Single point cane Curb Comments:        GAIT: Gait pattern: WFL Distance walked: pt report she is able to walk grocery store distances using shopping cart for support Assistive device utilized: Single point cane and None Level of assistance: Complete Independence and Modified independence Comments: reports use of cane for community distances, no AD for household use   FUNCTIONAL TESTs:  6 minute walk test: NT Berg Balance Scale: NT   PATIENT SURVEYS:      TODAY'S TREATMENT:  Evaluation, education, HEP initiation     PATIENT EDUCATION: Education details: use of anti-fatigue mats, support pillow for sleeping (pregnancy pillow), assessment findings Person educated: Patient Education method: Explanation and Handouts Education comprehension: needs further education     HOME EXERCISE PROGRAM: Access Code: [email or web address] URL: [email or web address] Date: 01/05/2022 Prepared by: Sherlyn Lees   Exercises - Supine Posterior Pelvic Tilt  - 1 x daily - 7 x weekly - 1-3 sets - 10 reps - 3 sec hold       GOALS: Goals reviewed with patient? Yes   SHORT TERM GOALS: Target date:  01/19/2022  Patient will be independent in HEP to improve functional outcomes with emphasis on core strength and hamstring flexibility Baseline: Goal status: INITIAL       LONG TERM GOALS: Target date: 02/02/2022   Pt to report successful positions/strategies for sleeping posture to improve quality of sleep and reduce back pain Baseline:  Goal status: INITIAL   2.  Demo trunk flexion strength 3-/5 for improve stability Baseline: 2+/5  Goal status: INITIAL   3.  Report improved standing tolerance with use of anti-fatigue mat or other adaptations x 20 min to improve efficiency of housekeeping tasks Baseline: 5-10 min Goal status: INITIAL   4.  Pt to report/demonstrate 3 strategies for self-management of chronic low back pain to improve comfort and QOL.  Baseline: main strategy at present is seated rest period Goal status: INITIAL     ASSESSMENT:   CLINICAL IMPRESSION: Pt presents to OPPT today with mild low back pain; with flexibility exercises today in supine, sitting, standing positions, pt has no c/o low back pain at end of session.  Updated HEP to reflect additional exercises for low back flexibility.  She will continue to benefit from skilled PT to progress towards core stability, lumbar flexibility and overall improved strength and functional mobility.      OBJECTIVE IMPAIRMENTS decreased activity tolerance, decreased endurance, difficulty walking, decreased strength, impaired perceived functional ability, increased muscle spasms, impaired flexibility, improper body mechanics, postural dysfunction, and pain.    ACTIVITY LIMITATIONS carrying, standing, squatting, sleeping, bed mobility, and locomotion level   PARTICIPATION LIMITATIONS: meal prep, cleaning, laundry, and community activity   PERSONAL FACTORS Age, Time since onset of injury/illness/exacerbation, and 1 comorbidity: hx of lumbar fusion  are also affecting patient's functional outcome.    REHAB POTENTIAL: Good    CLINICAL DECISION MAKING: Stable/uncomplicated   EVALUATION COMPLEXITY: Low   PLAN: PT FREQUENCY: 1-2x/week   PT DURATION: 4 weeks   PLANNED INTERVENTIONS: Therapeutic exercises, Therapeutic activity, Neuromuscular re-education, Balance training, Gait training, Patient/Family education, Self Care, Joint mobilization, Stair training, Vestibular training, Canalith repositioning, Orthotic/Fit training, DME instructions, Aquatic Therapy, Electrical stimulation, Spinal mobilization, Cryotherapy, Moist heat, Taping, Traction, Ionotophoresis '4mg'$ /ml Dexamethasone, and Manual therapy   PLAN FOR NEXT SESSION: Review updates to HEP; core stabilization, hamstring stretching, soft-tissue mobilization to paraspinals?    Frazier Butt., PT 01/11/2022, 3:46 PM  [facility] at [facility], [address] [place], [place] [address] Phone # [phone] Fax # [phone]

## 2022-01-12 ENCOUNTER — Other Ambulatory Visit (INDEPENDENT_AMBULATORY_CARE_PROVIDER_SITE_OTHER): Payer: Medicare Other

## 2022-01-12 DIAGNOSIS — I10 Essential (primary) hypertension: Secondary | ICD-10-CM

## 2022-01-12 LAB — BASIC METABOLIC PANEL
BUN: 29 mg/dL — ABNORMAL HIGH (ref 6–23)
CO2: 31 mEq/L (ref 19–32)
Calcium: 9.2 mg/dL (ref 8.4–10.5)
Chloride: 99 mEq/L (ref 96–112)
Creatinine, Ser: 1.31 mg/dL — ABNORMAL HIGH (ref 0.40–1.20)
GFR: 34.71 mL/min — ABNORMAL LOW (ref >60.00)
Glucose, Bld: 156 mg/dL — ABNORMAL HIGH (ref 70–99)
Potassium: 3.9 mEq/L (ref 3.5–5.1)
Sodium: 139 mEq/L (ref 135–145)

## 2022-01-12 NOTE — Therapy (Signed)
OUTPATIENT PHYSICAL THERAPY TREATMENT NOTE   Patient Name: [name] [name] MRN: [id number] DOB:01-Jan-1925, 88 y.o., female Today's Date: 01/13/2022  PCP:  Marin Olp, MD REFERRING PROVIDER: Izora Ribas, MD   END OF SESSION:   PT End of Session - 01/13/22 1151     Visit Number 3    Number of Visits 8    Date for PT Re-Evaluation 02/02/22    Authorization Type Medicare A/B; BCBS Supplemental 2023    PT Start Time 1059    PT Stop Time 1148    PT Time Calculation (min) 49 min    Activity Tolerance Patient tolerated treatment well    Behavior During Therapy WFL for tasks assessed/performed              Past Medical History:  Diagnosis Date   Acute on chronic diastolic heart failure ([place]) 12/30/2020   Allergic rhinoconjunctivitis    Allergy    Anxiety    pt. managed- uses deep breathing    Arthritis    low back , stenosis   COLONIC POLYPS, RECURRENT 08/29/2006   2008 last colonoscopy. No further colonoscopy.      COPD (chronic obstructive pulmonary disease) (HCC)    Diverticulosis    Dysrhythmia    afib   Eczema    Environmental allergies    allergy shot- q friday in Dr. Janee Morn office. PFT's abnormal- recommended Spiriva to use preop & will d/c after surgery   GERD (gastroesophageal reflux disease)    Hiatal hernia    Hyperlipidemia    Hypertension    Lung nodule 2011   Paroxysmal atrial fibrillation ([place]) 11/27/2019   Peripheral vascular disease (HCC)    Shingles    Stenosis of popliteal artery (HCC)    blood clots in legs long ago     Trigeminal neuralgia    Left buttocks   Past Surgical History:  Procedure Laterality Date   ABDOMINAL AORTAGRAM N/A 06/17/2011   Procedure: ABDOMINAL Maxcine Ham;  Surgeon: Elam Dutch, MD;  Location: [facility] CATH LAB;  Service: Cardiovascular;  Laterality: N/A;   ABDOMINAL AORTOGRAM W/LOWER EXTREMITY Bilateral 06/22/2018   Procedure: ABDOMINAL AORTOGRAM W/LOWER EXTREMITY;  Surgeon: Elam Dutch, MD;  Location: [place] CV LAB;  Service: Cardiovascular;  Laterality: Bilateral;   ABDOMINAL AORTOGRAM W/LOWER EXTREMITY Bilateral 11/29/2019   Procedure: ABDOMINAL AORTOGRAM W/LOWER EXTREMITY;  Surgeon: Elam Dutch, MD;  Location: [place] CV LAB;  Service: Cardiovascular;  Laterality: Bilateral;   CATARACT EXTRACTION, BILATERAL     w IOL   CHOLECYSTECTOMY  1998   COLONOSCOPY     ENDARTERECTOMY FEMORAL Left 12/09/2019   Procedure: ENDARTERECTOMY FEMORAL with bovine patch angioplasty.;  Surgeon: Elam Dutch, MD;  Location: [place];  Service: Vascular;  Laterality: Left;   FEMORAL-POPLITEAL BYPASS GRAFT        x2 surgeries 1990's & 2009   FEMORAL-POPLITEAL BYPASS GRAFT Right 12/09/2019   Procedure: REDO RIGHT FEMORAL-POPLITEAL ARTERY BYPASS GRAFT;  Surgeon: Elam Dutch, MD;  Location: [place];  Service: Vascular;  Laterality: Right;   INJECTION KNEE Right Aug. 2016   Gel injection for pain   LUMBAR FUSION  07/06/2011   TONSILLECTOMY     as a teenager    TUBAL LIGATION     Patient Active Problem List   Diagnosis Date Noted   Mild sleep apnea 05/03/2021   Chronic diastolic heart failure ([place]) 12/30/2020   Primary osteoarthritis of right knee 06/02/2020   Left groin hernia 05/22/2020  Sleep disturbance 01/09/2020   Acute lower UTI    Anxiety state    Peripheral edema    Leukocytosis    Debility 12/13/2019   Dyslipidemia    Rectal bleeding    Acute blood loss anemia    Ischemic colitis ([place])    Status post femoral-popliteal bypass surgery 12/09/2019   Persistent atrial fibrillation ([place]) 12/03/2019   Secondary hypercoagulable state ([place]) 12/03/2019   Chronic bilateral low back pain with right-sided sciatica 08/29/2019   Chronic pain of right knee 08/29/2019   Abnormality of gait 05/30/2019   Neuropathic pain 05/30/2019   Myalgia 08/08/2018   Chronic pain syndrome 06/27/2018   Gross hematuria 06/01/2018   Obesity (BMI 30.0-34.9) 11/14/2017   Failed back syndrome 09/28/2017    CKD (chronic kidney disease), stage III ([place]) 08/07/2017   Unilateral primary osteoarthritis, right knee 04/07/2016   Acute gout of left foot 03/22/2016   Pain in right ankle and joints of right foot 08/28/2014   Trigeminal neuralgia    Encounter for therapeutic drug monitoring 10/21/2013   Syncope 12/29/2011   Chronic low back pain 09/13/2010   Allergic rhinitis due to pollen 06/25/2010   COPD mixed type ([place]) 01/22/2010   Solitary pulmonary nodule 01/12/2010   Insulin resistance 01/06/2009   Osteopenia 01/06/2009   Eczema 11/26/2007   Peripheral arterial disease ([place]) 06/29/2007   Hyperlipidemia 10/23/2006   Essential hypertension 10/23/2006   ESOPHAGEAL STRICTURE 08/09/1993    REFERRING DIAG: M79.10 (ICD-10-CM) - Myalgia   THERAPY DIAG:  Chronic bilateral low back pain without sciatica  Muscle weakness (generalized)  Cramp and spasm  Other abnormalities of gait and mobility  Rationale for Evaluation and Treatment Rehabilitation  PERTINENT HISTORY:  hx of lumbar fusion  PRECAUTIONS: None  SUBJECTIVE: Having very little pain this morning. Denies new concerns.  PAIN:  Are you having pain? Yes: NPRS scale: 1-2/10 Pain location: back pain Pain description: constant Aggravating factors: damp weather  Relieving factors: movement alleviates   OBJECTIVE:     TODAY'S TREATMENT: 01/13/22 Activity Comments  Nustep L5 x 3 min, L2x 3 min Ues/Les  Cues to maintain 70s SPM; dropped resistance to report of muscle fatigue   hooklying pelvic tilts 10x Manual cues for proper form  Bridge 2x10 Limited ROM d/t weakness   LTR 20x  Cues to avoid straining and to perform to tolerance  alt bent knee fallout red TB  2x10 Cues to maintain opposite LE stable  Deadbug 5x each Difficulty maintaining posterior pelvic tilt   sitting HS stretch 30 sec each Good tolerance   STS 10x with green medball Good eccentric control     HOME EXERCISE PROGRAM Last updated: 01/13/22 Access Code:  [email or web address] URL: [email or web address] Date: 01/13/2022 Prepared by: [facility]  Exercises - Supine Posterior Pelvic Tilt  - 1 x daily - 7 x weekly - 1-3 sets - 10 reps - 3 sec hold - Hooklying Single Knee to Chest Stretch  - 1 x daily - 7 x weekly - 1 sets - 3 reps - 15-30 sec hold - Supine Lower Trunk Rotation  - 1 x daily - 7 x weekly - 3 sets - 10 reps - 15-30 sec hold - Sidelying Thoracic Lumbar Rotation  - 1 x daily - 7 x weekly - 1 sets - 3 reps - 15-30 sec hold - Staggered Stance Forward Backward Weight Shift with Counter Support  - 1-2 x daily - 7 x weekly - 1-2 sets -  10 reps - Hooklying Single Leg Bent Knee Fallouts with Resistance  - 1 x daily - 5 x weekly - 2 sets - 10 reps - Supine Bridge  - 1 x daily - 5 x weekly - 2 sets - 10 reps   PATIENT EDUCATION: Education details: HEP update; answering patient's questions Person educated: Patient Education method: Explanation, Demonstration, Tactile cues, Verbal cues, and Handouts Education comprehension: verbalized understanding and returned demonstration   ------------------------------------------------------------------------------- (objective measures completed at initial evaluation unless otherwise dated)   DIAGNOSTIC FINDINGS:  IMPRESSION: 1. L4-5 PLIF with resection of previously seen disc material. No residual spinal canal stenosis. 2. Unchanged moderate bilateral neural foraminal stenosis at L2-3 and L3-4. 3. Progression of severe right T11-12 and T12-L1 neural foraminal stenosis.   COGNITION: Overall cognitive status: Within functional limits for tasks assessed             SENSATION: WFL, denies parasthesias   COORDINATION: WNL   EDEMA:      MUSCLE TONE: WNL   Palpation:  lower thoracic/high lumbar paraspinals very tight and tender to palpation, decrease in lumbar lordosis notable     MUSCLE LENGTH: Left hamstrings tight and restricted vs right. Left  SLR 35 degrees vs RLE SLR of 50-60 deg   DTRs:  DNT   POSTURE: No Significant postural limitations Side-sleeper, typically on the left      LOWER EXTREMITY ROM:    WFL   Lumbar ROM: flexion WFL                         Extension grossly limited                         Sidebending symmetrical 10% limited   LOWER EXTREMITY MMT:     MMT Right Eval Left Eval  Hip flexion 4 4  Hip extension      Hip abduction 4 4  Hip adduction      Hip internal rotation      Hip external rotation      Knee flexion 5 5  Knee extension 5 5  Ankle dorsiflexion 5 5  Ankle plantarflexion      Ankle inversion      Ankle eversion      (Blank rows = not tested)   Trunk flexion: 2+/5     BED MOBILITY:  Modified independent, increased time   TRANSFERS: Assistive device utilized: None  Sit to stand: Complete Independence Stand to sit: Complete Independence Chair to chair: Complete Independence Floor:  DNT       CURB:  Level of Assistance: Modified independence Assistive device utilized: Single point cane Curb Comments:        GAIT: Gait pattern: WFL Distance walked: pt report she is able to walk grocery store distances using shopping cart for support Assistive device utilized: Single point cane and None Level of assistance: Complete Independence and Modified independence Comments: reports use of cane for community distances, no AD for household use   FUNCTIONAL TESTs:  6 minute walk test: NT Berg Balance Scale: NT   PATIENT SURVEYS:      TODAY'S TREATMENT:  Evaluation, education, HEP initiation     PATIENT EDUCATION: Education details: use of anti-fatigue mats, support pillow for sleeping (pregnancy pillow), assessment findings Person educated: Patient Education method: Explanation and Handouts Education comprehension: needs further education     HOME EXERCISE PROGRAM: Access Code: [email or web address] URL: [email or web address] Date: 01/05/2022 Prepared  by:  Sherlyn Lees   Exercises - Supine Posterior Pelvic Tilt  - 1 x daily - 7 x weekly - 1-3 sets - 10 reps - 3 sec hold       GOALS: Goals reviewed with patient? Yes   SHORT TERM GOALS: Target date: 01/19/2022   Patient will be independent in HEP to improve functional outcomes with emphasis on core strength and hamstring flexibility Baseline: Goal status: IN PROGRESS       LONG TERM GOALS: Target date: 02/02/2022   Pt to report successful positions/strategies for sleeping posture to improve quality of sleep and reduce back pain Baseline:  Goal status: IN PROGRESS   2.  Demo trunk flexion strength 3-/5 for improve stability Baseline: 2+/5  Goal status: IN PROGRESS   3.  Report improved standing tolerance with use of anti-fatigue mat or other adaptations x 20 min to improve efficiency of housekeeping tasks Baseline: 5-10 min Goal status: IN PROGRESS   4.  Pt to report/demonstrate 3 strategies for self-management of chronic low back pain to improve comfort and QOL.  Baseline: main strategy at present is seated rest period Goal status: IN PROGRESS  5.  Pt to demonstrate safe floor transfer with mod I.   Baseline: NT Goal status: INITIAL  6.  Pt to score at least 46/56 on Berg to indicate decreased risk of falls.    Baseline: NT Goal status: INITIAL     ASSESSMENT:   CLINICAL IMPRESSION: Patient arrived to session with report of very mild LBP this AM. Worked on gentle lumbopelvic mobility activities. Gentle core strengthening activities were performed with intermittent cues for proper form- patient tolerated this well despite limited amplitude movement evident d/t stiffness vs. weakness. Patient expresses interest in working toward floor transfers as she does not feel confident in her ability to get up from the floor and reports concerns about her balance. Updated goals to address this. Patient reported understanding of all edu provided and without complaints at end of session.      OBJECTIVE IMPAIRMENTS decreased activity tolerance, decreased endurance, difficulty walking, decreased strength, impaired perceived functional ability, increased muscle spasms, impaired flexibility, improper body mechanics, postural dysfunction, and pain.    ACTIVITY LIMITATIONS carrying, standing, squatting, sleeping, bed mobility, and locomotion level   PARTICIPATION LIMITATIONS: meal prep, cleaning, laundry, and community activity   PERSONAL FACTORS Age, Time since onset of injury/illness/exacerbation, and 1 comorbidity: hx of lumbar fusion  are also affecting patient's functional outcome.    REHAB POTENTIAL: Good   CLINICAL DECISION MAKING: Stable/uncomplicated   EVALUATION COMPLEXITY: Low   PLAN: PT FREQUENCY: 1-2x/week   PT DURATION: 4 weeks   PLANNED INTERVENTIONS: Therapeutic exercises, Therapeutic activity, Neuromuscular re-education, Balance training, Gait training, Patient/Family education, Self Care, Joint mobilization, Stair training, Vestibular training, Canalith repositioning, Orthotic/Fit training, DME instructions, Aquatic Therapy, Electrical stimulation, Spinal mobilization, Cryotherapy, Moist heat, Taping, Traction, Ionotophoresis '4mg'$ /ml Dexamethasone, and Manual therapy   PLAN FOR NEXT SESSION: Berg; Address patient's questions about which hand to use cane in and provide recommendation on most helpful AD, work towards floor transfers; Review updates to HEP; core stabilization, hamstring stretching, soft-tissue mobilization to paraspinals;      Janene Harvey, PT, DPT 01/13/22 12:00 PM  [facility] at [facility] [address], [place], [place] [address] Phone # [phone] Fax # [phone]

## 2022-01-13 ENCOUNTER — Encounter: Payer: Self-pay | Admitting: Physical Therapy

## 2022-01-13 ENCOUNTER — Ambulatory Visit: Payer: Medicare Other | Admitting: Physical Therapy

## 2022-01-13 DIAGNOSIS — R2689 Other abnormalities of gait and mobility: Secondary | ICD-10-CM | POA: Diagnosis not present

## 2022-01-13 DIAGNOSIS — M6281 Muscle weakness (generalized): Secondary | ICD-10-CM

## 2022-01-13 DIAGNOSIS — M545 Low back pain, unspecified: Secondary | ICD-10-CM

## 2022-01-13 DIAGNOSIS — R252 Cramp and spasm: Secondary | ICD-10-CM

## 2022-01-13 DIAGNOSIS — M791 Myalgia, unspecified site: Secondary | ICD-10-CM | POA: Diagnosis not present

## 2022-01-13 DIAGNOSIS — G8929 Other chronic pain: Secondary | ICD-10-CM | POA: Diagnosis not present

## 2022-01-17 ENCOUNTER — Telehealth: Payer: Self-pay | Admitting: Orthopaedic Surgery

## 2022-01-17 NOTE — Telephone Encounter (Signed)
Patient called. Would like a gel injection. Wanted to know if it was approved?

## 2022-01-18 ENCOUNTER — Ambulatory Visit: Payer: Medicare Other

## 2022-01-18 DIAGNOSIS — R2689 Other abnormalities of gait and mobility: Secondary | ICD-10-CM

## 2022-01-18 DIAGNOSIS — M791 Myalgia, unspecified site: Secondary | ICD-10-CM | POA: Diagnosis not present

## 2022-01-18 DIAGNOSIS — M6281 Muscle weakness (generalized): Secondary | ICD-10-CM | POA: Diagnosis not present

## 2022-01-18 DIAGNOSIS — M545 Low back pain, unspecified: Secondary | ICD-10-CM

## 2022-01-18 DIAGNOSIS — G8929 Other chronic pain: Secondary | ICD-10-CM | POA: Diagnosis not present

## 2022-01-18 DIAGNOSIS — R252 Cramp and spasm: Secondary | ICD-10-CM | POA: Diagnosis not present

## 2022-01-18 NOTE — Telephone Encounter (Signed)
Gel injection approved.  Will call patient to schedule

## 2022-01-18 NOTE — Therapy (Signed)
OUTPATIENT PHYSICAL THERAPY TREATMENT NOTE   Patient Name: [name] [name] MRN: [id number] DOB:01/07/1925, 88 y.o., female Today's Date: 01/18/2022  PCP:  Marin Olp, MD REFERRING PROVIDER: Izora Ribas, MD   END OF SESSION:   PT End of Session - 01/18/22 1020     Visit Number 4    Number of Visits 8    Date for PT Re-Evaluation 02/02/22    Authorization Type Medicare A/B; BCBS Supplemental 2023    PT Start Time 1016    PT Stop Time 1100    PT Time Calculation (min) 44 min    Activity Tolerance Patient tolerated treatment well    Behavior During Therapy WFL for tasks assessed/performed              Past Medical History:  Diagnosis Date   Acute on chronic diastolic heart failure ([place]) 12/30/2020   Allergic rhinoconjunctivitis    Allergy    Anxiety    pt. managed- uses deep breathing    Arthritis    low back , stenosis   COLONIC POLYPS, RECURRENT 08/29/2006   2008 last colonoscopy. No further colonoscopy.      COPD (chronic obstructive pulmonary disease) (HCC)    Diverticulosis    Dysrhythmia    afib   Eczema    Environmental allergies    allergy shot- q friday in Dr. Janee Morn office. PFT's abnormal- recommended Spiriva to use preop & will d/c after surgery   GERD (gastroesophageal reflux disease)    Hiatal hernia    Hyperlipidemia    Hypertension    Lung nodule 2011   Paroxysmal atrial fibrillation ([place]) 11/27/2019   Peripheral vascular disease (HCC)    Shingles    Stenosis of popliteal artery (HCC)    blood clots in legs long ago     Trigeminal neuralgia    Left buttocks   Past Surgical History:  Procedure Laterality Date   ABDOMINAL AORTAGRAM N/A 06/17/2011   Procedure: ABDOMINAL Maxcine Ham;  Surgeon: Elam Dutch, MD;  Location: [facility] CATH LAB;  Service: Cardiovascular;  Laterality: N/A;   ABDOMINAL AORTOGRAM W/LOWER EXTREMITY Bilateral 06/22/2018   Procedure: ABDOMINAL AORTOGRAM W/LOWER EXTREMITY;  Surgeon: Elam Dutch, MD;  Location: [place] CV LAB;  Service: Cardiovascular;  Laterality: Bilateral;   ABDOMINAL AORTOGRAM W/LOWER EXTREMITY Bilateral 11/29/2019   Procedure: ABDOMINAL AORTOGRAM W/LOWER EXTREMITY;  Surgeon: Elam Dutch, MD;  Location: [place] CV LAB;  Service: Cardiovascular;  Laterality: Bilateral;   CATARACT EXTRACTION, BILATERAL     w IOL   CHOLECYSTECTOMY  1998   COLONOSCOPY     ENDARTERECTOMY FEMORAL Left 12/09/2019   Procedure: ENDARTERECTOMY FEMORAL with bovine patch angioplasty.;  Surgeon: Elam Dutch, MD;  Location: [place];  Service: Vascular;  Laterality: Left;   FEMORAL-POPLITEAL BYPASS GRAFT        x2 surgeries 1990's & 2009   FEMORAL-POPLITEAL BYPASS GRAFT Right 12/09/2019   Procedure: REDO RIGHT FEMORAL-POPLITEAL ARTERY BYPASS GRAFT;  Surgeon: Elam Dutch, MD;  Location: [place];  Service: Vascular;  Laterality: Right;   INJECTION KNEE Right Aug. 2016   Gel injection for pain   LUMBAR FUSION  07/06/2011   TONSILLECTOMY     as a teenager    TUBAL LIGATION     Patient Active Problem List   Diagnosis Date Noted   Mild sleep apnea 05/03/2021   Chronic diastolic heart failure ([place]) 12/30/2020   Primary osteoarthritis of right knee 06/02/2020   Left groin hernia 05/22/2020  Sleep disturbance 01/09/2020   Acute lower UTI    Anxiety state    Peripheral edema    Leukocytosis    Debility 12/13/2019   Dyslipidemia    Rectal bleeding    Acute blood loss anemia    Ischemic colitis ([place])    Status post femoral-popliteal bypass surgery 12/09/2019   Persistent atrial fibrillation ([place]) 12/03/2019   Secondary hypercoagulable state ([place]) 12/03/2019   Chronic bilateral low back pain with right-sided sciatica 08/29/2019   Chronic pain of right knee 08/29/2019   Abnormality of gait 05/30/2019   Neuropathic pain 05/30/2019   Myalgia 08/08/2018   Chronic pain syndrome 06/27/2018   Gross hematuria 06/01/2018   Obesity (BMI 30.0-34.9) 11/14/2017   Failed back syndrome 09/28/2017    CKD (chronic kidney disease), stage III ([place]) 08/07/2017   Unilateral primary osteoarthritis, right knee 04/07/2016   Acute gout of left foot 03/22/2016   Pain in right ankle and joints of right foot 08/28/2014   Trigeminal neuralgia    Encounter for therapeutic drug monitoring 10/21/2013   Syncope 12/29/2011   Chronic low back pain 09/13/2010   Allergic rhinitis due to pollen 06/25/2010   COPD mixed type ([place]) 01/22/2010   Solitary pulmonary nodule 01/12/2010   Insulin resistance 01/06/2009   Osteopenia 01/06/2009   Eczema 11/26/2007   Peripheral arterial disease ([place]) 06/29/2007   Hyperlipidemia 10/23/2006   Essential hypertension 10/23/2006   ESOPHAGEAL STRICTURE 08/09/1993    REFERRING DIAG: M79.10 (ICD-10-CM) - Myalgia   THERAPY DIAG:  Chronic bilateral low back pain without sciatica  Muscle weakness (generalized)  Other abnormalities of gait and mobility  Rationale for Evaluation and Treatment Rehabilitation  PERTINENT HISTORY:  hx of lumbar fusion  PRECAUTIONS: None  SUBJECTIVE: Symptoms are about the same, the exercises are coming easier  PAIN:  Are you having pain? Yes: NPRS scale: 1-2/10 Pain location: back pain Pain description: constant Aggravating factors: damp weather  Relieving factors: movement alleviates   OBJECTIVE:     TODAY'S TREATMENT: 01/18/22 Activity Comments  Nustep L5 x 5 min Cues to maintain 70s SPM; dropped resistance to report of muscle fatigue   hooklying pelvic tilts 10x Manual cues for proper form  SKTC 3x10 sec Opposite leg in extension and active QS  Bridge 2x10 Limited ROM d/t weakness   LTR 20x  Cues to avoid straining and to perform to tolerance  alt bent knee fallout  2x10 Cues to maintain opposite LE stable  Deadbug isometric position Progressed to single extremity movements  sitting HS stretch 2x30 sec each Good tolerance        HOME EXERCISE PROGRAM Last updated: 01/13/22 Access Code: [email or web address] URL:  [email or web address] Date: 01/13/2022 Prepared by: [facility]  Exercises - Supine Posterior Pelvic Tilt  - 1 x daily - 7 x weekly - 1-3 sets - 10 reps - 3 sec hold - Hooklying Single Knee to Chest Stretch  - 1 x daily - 7 x weekly - 1 sets - 3 reps - 15-30 sec hold - Supine Lower Trunk Rotation  - 1 x daily - 7 x weekly - 3 sets - 10 reps - 15-30 sec hold - Sidelying Thoracic Lumbar Rotation  - 1 x daily - 7 x weekly - 1 sets - 3 reps - 15-30 sec hold - Staggered Stance Forward Backward Weight Shift with Counter Support  - 1-2 x daily - 7 x weekly - 1-2 sets - 10 reps - Hooklying Single Leg  Bent Knee Fallouts with Resistance  - 1 x daily - 5 x weekly - 2 sets - 10 reps - Supine Bridge  - 1 x daily - 5 x weekly - 2 sets - 10 reps   PATIENT EDUCATION: Education details: HEP update; answering patient's questions Person educated: Patient Education method: Explanation, Demonstration, Tactile cues, Verbal cues, and Handouts Education comprehension: verbalized understanding and returned demonstration   ------------------------------------------------------------------------------- (objective measures completed at initial evaluation unless otherwise dated)   DIAGNOSTIC FINDINGS:  IMPRESSION: 1. L4-5 PLIF with resection of previously seen disc material. No residual spinal canal stenosis. 2. Unchanged moderate bilateral neural foraminal stenosis at L2-3 and L3-4. 3. Progression of severe right T11-12 and T12-L1 neural foraminal stenosis.   COGNITION: Overall cognitive status: Within functional limits for tasks assessed             SENSATION: WFL, denies parasthesias   COORDINATION: WNL   EDEMA:      MUSCLE TONE: WNL   Palpation:  lower thoracic/high lumbar paraspinals very tight and tender to palpation, decrease in lumbar lordosis notable     MUSCLE LENGTH: Left hamstrings tight and restricted vs right. Left SLR 35 degrees  vs RLE SLR of 50-60 deg   DTRs:  DNT   POSTURE: No Significant postural limitations Side-sleeper, typically on the left      LOWER EXTREMITY ROM:    WFL   Lumbar ROM: flexion WFL                         Extension grossly limited                         Sidebending symmetrical 10% limited   LOWER EXTREMITY MMT:     MMT Right Eval Left Eval  Hip flexion 4 4  Hip extension      Hip abduction 4 4  Hip adduction      Hip internal rotation      Hip external rotation      Knee flexion 5 5  Knee extension 5 5  Ankle dorsiflexion 5 5  Ankle plantarflexion      Ankle inversion      Ankle eversion      (Blank rows = not tested)   Trunk flexion: 2+/5     BED MOBILITY:  Modified independent, increased time   TRANSFERS: Assistive device utilized: None  Sit to stand: Complete Independence Stand to sit: Complete Independence Chair to chair: Complete Independence Floor:  DNT       CURB:  Level of Assistance: Modified independence Assistive device utilized: Single point cane Curb Comments:        GAIT: Gait pattern: WFL Distance walked: pt report she is able to walk grocery store distances using shopping cart for support Assistive device utilized: Single point cane and None Level of assistance: Complete Independence and Modified independence Comments: reports use of cane for community distances, no AD for household use   FUNCTIONAL TESTs:  6 minute walk test: NT Berg Balance Scale: NT   PATIENT SURVEYS:      TODAY'S TREATMENT:  Evaluation, education, HEP initiation     PATIENT EDUCATION: Education details: use of anti-fatigue mats, support pillow for sleeping (pregnancy pillow), assessment findings Person educated: Patient Education method: Explanation and Handouts Education comprehension: needs further education     HOME EXERCISE PROGRAM: Access Code: [email or web address] URL: [email or web address] Date: 01/05/2022 Prepared by: Sherlyn Lees  Exercises - Supine Posterior Pelvic Tilt  - 1 x daily - 7 x weekly - 1-3 sets - 10 reps - 3 sec hold       GOALS: Goals reviewed with patient? Yes   SHORT TERM GOALS: Target date: 01/19/2022   Patient will be independent in HEP to improve functional outcomes with emphasis on core strength and hamstring flexibility Baseline: Goal status: IN PROGRESS       LONG TERM GOALS: Target date: 02/02/2022   Pt to report successful positions/strategies for sleeping posture to improve quality of sleep and reduce back pain Baseline:  Goal status: IN PROGRESS   2.  Demo trunk flexion strength 3-/5 for improve stability Baseline: 2+/5  Goal status: IN PROGRESS   3.  Report improved standing tolerance with use of anti-fatigue mat or other adaptations x 20 min to improve efficiency of housekeeping tasks Baseline: 5-10 min Goal status: IN PROGRESS   4.  Pt to report/demonstrate 3 strategies for self-management of chronic low back pain to improve comfort and QOL.  Baseline: main strategy at present is seated rest period Goal status: IN PROGRESS  5.  Pt to demonstrate safe floor transfer with mod I.   Baseline: NT Goal status: INITIAL  6.  Pt to score at least 46/56 on Berg to indicate decreased risk of falls.    Baseline: NT Goal status: INITIAL     ASSESSMENT:   CLINICAL IMPRESSION: Tx emphasis on reinforcement of abdominal bracing/strengthening and coordination of this movements to promote stabilization.  Improved performance and isolation with tactile cues and hand placement on pelvis/ASIS for reference.  Proceeded with continued lumbar stabilization and progressed to more dynamic activities via "dying bug" set-up position and proceeded with single extremity movements to improve dynamic stabilization.  Continued sessions indicated to progress balance and generalize core strength/recruitment to functional lifting activities.  Trials of walking on uneven ground    OBJECTIVE IMPAIRMENTS  decreased activity tolerance, decreased endurance, difficulty walking, decreased strength, impaired perceived functional ability, increased muscle spasms, impaired flexibility, improper body mechanics, postural dysfunction, and pain.    ACTIVITY LIMITATIONS carrying, standing, squatting, sleeping, bed mobility, and locomotion level   PARTICIPATION LIMITATIONS: meal prep, cleaning, laundry, and community activity   PERSONAL FACTORS Age, Time since onset of injury/illness/exacerbation, and 1 comorbidity: hx of lumbar fusion  are also affecting patient's functional outcome.    REHAB POTENTIAL: Good   CLINICAL DECISION MAKING: Stable/uncomplicated   EVALUATION COMPLEXITY: Low   PLAN: PT FREQUENCY: 1-2x/week   PT DURATION: 4 weeks   PLANNED INTERVENTIONS: Therapeutic exercises, Therapeutic activity, Neuromuscular re-education, Balance training, Gait training, Patient/Family education, Self Care, Joint mobilization, Stair training, Vestibular training, Canalith repositioning, Orthotic/Fit training, DME instructions, Aquatic Therapy, Electrical stimulation, Spinal mobilization, Cryotherapy, Moist heat, Taping, Traction, Ionotophoresis '4mg'$ /ml Dexamethasone, and Manual therapy   PLAN FOR NEXT SESSION: Berg Balance. Would like to be able to practice floor to stand transfers and walking on uneven surfaces to walk to her patio area     11:01 AM, 01/18/22 M. Sherlyn Lees, PT, DPT Physical Therapist- [place] Office Number: [phone]   [place] [facility] [address] [place], [place] [address] Phone # [phone] Fax # [phone]

## 2022-01-19 ENCOUNTER — Other Ambulatory Visit: Payer: Self-pay

## 2022-01-19 ENCOUNTER — Telehealth: Payer: Self-pay | Admitting: Orthopaedic Surgery

## 2022-01-19 DIAGNOSIS — M1711 Unilateral primary osteoarthritis, right knee: Secondary | ICD-10-CM

## 2022-01-19 NOTE — Telephone Encounter (Signed)
[name] [name] wants to know if gel shot are approved

## 2022-01-19 NOTE — Telephone Encounter (Signed)
Tried calling patient back to schedule for gel injection, but phone line is busy.

## 2022-01-20 ENCOUNTER — Encounter: Payer: Self-pay | Admitting: Physical Therapy

## 2022-01-20 ENCOUNTER — Ambulatory Visit: Payer: Medicare Other | Admitting: Physical Therapy

## 2022-01-20 DIAGNOSIS — M6281 Muscle weakness (generalized): Secondary | ICD-10-CM | POA: Diagnosis not present

## 2022-01-20 DIAGNOSIS — G8929 Other chronic pain: Secondary | ICD-10-CM | POA: Diagnosis not present

## 2022-01-20 DIAGNOSIS — M545 Low back pain, unspecified: Secondary | ICD-10-CM

## 2022-01-20 DIAGNOSIS — R252 Cramp and spasm: Secondary | ICD-10-CM | POA: Diagnosis not present

## 2022-01-20 DIAGNOSIS — M791 Myalgia, unspecified site: Secondary | ICD-10-CM | POA: Diagnosis not present

## 2022-01-20 DIAGNOSIS — R2689 Other abnormalities of gait and mobility: Secondary | ICD-10-CM

## 2022-01-20 NOTE — Therapy (Signed)
OUTPATIENT PHYSICAL THERAPY TREATMENT NOTE   Patient Name: [name] [name] MRN: [id number] DOB:January 17, 1925, 88 y.o., female Today's Date: 01/20/2022  PCP:  Marin Olp, MD REFERRING PROVIDER: Izora Ribas, MD   END OF SESSION:   PT End of Session - 01/20/22 1100     Visit Number 5    Number of Visits 8    Date for PT Re-Evaluation 02/02/22    Authorization Type Medicare A/B; BCBS Supplemental 2023    PT Start Time 3329    PT Stop Time 1059    PT Time Calculation (min) 44 min    Activity Tolerance Patient tolerated treatment well;Patient limited by pain    Behavior During Therapy [facility] for tasks assessed/performed               Past Medical History:  Diagnosis Date   Acute on chronic diastolic heart failure ([place]) 12/30/2020   Allergic rhinoconjunctivitis    Allergy    Anxiety    pt. managed- uses deep breathing    Arthritis    low back , stenosis   COLONIC POLYPS, RECURRENT 08/29/2006   2008 last colonoscopy. No further colonoscopy.      COPD (chronic obstructive pulmonary disease) (HCC)    Diverticulosis    Dysrhythmia    afib   Eczema    Environmental allergies    allergy shot- q friday in Dr. Janee Morn office. PFT's abnormal- recommended Spiriva to use preop & will d/c after surgery   GERD (gastroesophageal reflux disease)    Hiatal hernia    Hyperlipidemia    Hypertension    Lung nodule 2011   Paroxysmal atrial fibrillation ([place]) 11/27/2019   Peripheral vascular disease (HCC)    Shingles    Stenosis of popliteal artery (HCC)    blood clots in legs long ago     Trigeminal neuralgia    Left buttocks   Past Surgical History:  Procedure Laterality Date   ABDOMINAL AORTAGRAM N/A 06/17/2011   Procedure: ABDOMINAL Maxcine Ham;  Surgeon: Elam Dutch, MD;  Location: [facility] CATH LAB;  Service: Cardiovascular;  Laterality: N/A;   ABDOMINAL AORTOGRAM W/LOWER EXTREMITY Bilateral 06/22/2018   Procedure: ABDOMINAL AORTOGRAM W/LOWER EXTREMITY;  Surgeon: Elam Dutch, MD;  Location: [place] CV LAB;  Service: Cardiovascular;  Laterality: Bilateral;   ABDOMINAL AORTOGRAM W/LOWER EXTREMITY Bilateral 11/29/2019   Procedure: ABDOMINAL AORTOGRAM W/LOWER EXTREMITY;  Surgeon: Elam Dutch, MD;  Location: [place] CV LAB;  Service: Cardiovascular;  Laterality: Bilateral;   CATARACT EXTRACTION, BILATERAL     w IOL   CHOLECYSTECTOMY  1998   COLONOSCOPY     ENDARTERECTOMY FEMORAL Left 12/09/2019   Procedure: ENDARTERECTOMY FEMORAL with bovine patch angioplasty.;  Surgeon: Elam Dutch, MD;  Location: [place];  Service: Vascular;  Laterality: Left;   FEMORAL-POPLITEAL BYPASS GRAFT        x2 surgeries 1990's & 2009   FEMORAL-POPLITEAL BYPASS GRAFT Right 12/09/2019   Procedure: REDO RIGHT FEMORAL-POPLITEAL ARTERY BYPASS GRAFT;  Surgeon: Elam Dutch, MD;  Location: [facility];  Service: Vascular;  Laterality: Right;   INJECTION KNEE Right Aug. 2016   Gel injection for pain   LUMBAR FUSION  07/06/2011   TONSILLECTOMY     as a teenager    TUBAL LIGATION     Patient Active Problem List   Diagnosis Date Noted   Mild sleep apnea 05/03/2021   Chronic diastolic heart failure ([place]) 12/30/2020   Primary osteoarthritis of right knee 06/02/2020  Left groin hernia 05/22/2020   Sleep disturbance 01/09/2020   Acute lower UTI    Anxiety state    Peripheral edema    Leukocytosis    Debility 12/13/2019   Dyslipidemia    Rectal bleeding    Acute blood loss anemia    Ischemic colitis ([place])    Status post femoral-popliteal bypass surgery 12/09/2019   Persistent atrial fibrillation ([place]) 12/03/2019   Secondary hypercoagulable state ([place]) 12/03/2019   Chronic bilateral low back pain with right-sided sciatica 08/29/2019   Chronic pain of right knee 08/29/2019   Abnormality of gait 05/30/2019   Neuropathic pain 05/30/2019   Myalgia 08/08/2018   Chronic pain syndrome 06/27/2018   Gross hematuria 06/01/2018   Obesity (BMI 30.0-34.9) 11/14/2017   Failed  back syndrome 09/28/2017   CKD (chronic kidney disease), stage III ([place]) 08/07/2017   Unilateral primary osteoarthritis, right knee 04/07/2016   Acute gout of left foot 03/22/2016   Pain in right ankle and joints of right foot 08/28/2014   Trigeminal neuralgia    Encounter for therapeutic drug monitoring 10/21/2013   Syncope 12/29/2011   Chronic low back pain 09/13/2010   Allergic rhinitis due to pollen 06/25/2010   COPD mixed type ([place]) 01/22/2010   Solitary pulmonary nodule 01/12/2010   Insulin resistance 01/06/2009   Osteopenia 01/06/2009   Eczema 11/26/2007   Peripheral arterial disease ([place]) 06/29/2007   Hyperlipidemia 10/23/2006   Essential hypertension 10/23/2006   ESOPHAGEAL STRICTURE 08/09/1993    REFERRING DIAG: M79.10 (ICD-10-CM) - Myalgia   THERAPY DIAG:  Chronic bilateral low back pain without sciatica  Muscle weakness (generalized)  Other abnormalities of gait and mobility  Cramp and spasm  Rationale for Evaluation and Treatment Rehabilitation  PERTINENT HISTORY:  hx of lumbar fusion  PRECAUTIONS: None  SUBJECTIVE: Back is hurting more today- has something to do with the weather.   PAIN:  Are you having pain? Yes: NPRS scale: 5-6/10 Pain location: back pain Pain description: constant Aggravating factors: damp weather  Relieving factors: movement alleviates   OBJECTIVE:     TODAY'S TREATMENT: 01/20/22 Activity Comments  Nustep L2x6 min Ues/LEs Maintaining 80SPM  Green pball prayer stretch 10x3-5" Good tolerance and report of relief  R/L open book stretch 10x each To tolerance   Sidelying clamshell 2x10 Cues to roll forward and avoid compensating with LB; C/o difficulty on last few reps   LTR 20x C/o difficulty on last few reps   Pelvic tilts 10x Manual cueing for improved form/understanding  STM to B lumbar paraspinals, glutes, piriformis Soft tissue restriction and TTP on superior glutes, piriformis, lumbar paraspinals          PATIENT  EDUCATION: Education details: answering patient's questions on how to reduce/manage pain at home Person educated: Patient Education method: Explanation Education comprehension: verbalized understanding   HOME EXERCISE PROGRAM Last updated: 01/13/22 Access Code: [email or web address] URL: [email or web address] Date: 01/13/2022 Prepared by: [facility]  Exercises - Supine Posterior Pelvic Tilt  - 1 x daily - 7 x weekly - 1-3 sets - 10 reps - 3 sec hold - Hooklying Single Knee to Chest Stretch  - 1 x daily - 7 x weekly - 1 sets - 3 reps - 15-30 sec hold - Supine Lower Trunk Rotation  - 1 x daily - 7 x weekly - 3 sets - 10 reps - 15-30 sec hold - Sidelying Thoracic Lumbar Rotation  - 1 x daily - 7 x weekly - 1 sets -  3 reps - 15-30 sec hold - Staggered Stance Forward Backward Weight Shift with Counter Support  - 1-2 x daily - 7 x weekly - 1-2 sets - 10 reps - Hooklying Single Leg Bent Knee Fallouts with Resistance  - 1 x daily - 5 x weekly - 2 sets - 10 reps - Supine Bridge  - 1 x daily - 5 x weekly - 2 sets - 10 reps     ------------------------------------------------------------------------------- (objective measures completed at initial evaluation unless otherwise dated)   DIAGNOSTIC FINDINGS:  IMPRESSION: 1. L4-5 PLIF with resection of previously seen disc material. No residual spinal canal stenosis. 2. Unchanged moderate bilateral neural foraminal stenosis at L2-3 and L3-4. 3. Progression of severe right T11-12 and T12-L1 neural foraminal stenosis.   COGNITION: Overall cognitive status: Within functional limits for tasks assessed             SENSATION: WFL, denies parasthesias   COORDINATION: WNL   EDEMA:      MUSCLE TONE: WNL   Palpation:  lower thoracic/high lumbar paraspinals very tight and tender to palpation, decrease in lumbar lordosis notable     MUSCLE LENGTH: Left hamstrings tight and restricted vs right. Left  SLR 35 degrees vs RLE SLR of 50-60 deg   DTRs:  DNT   POSTURE: No Significant postural limitations Side-sleeper, typically on the left      LOWER EXTREMITY ROM:    WFL   Lumbar ROM: flexion WFL                         Extension grossly limited                         Sidebending symmetrical 10% limited   LOWER EXTREMITY MMT:     MMT Right Eval Left Eval  Hip flexion 4 4  Hip extension      Hip abduction 4 4  Hip adduction      Hip internal rotation      Hip external rotation      Knee flexion 5 5  Knee extension 5 5  Ankle dorsiflexion 5 5  Ankle plantarflexion      Ankle inversion      Ankle eversion      (Blank rows = not tested)   Trunk flexion: 2+/5     BED MOBILITY:  Modified independent, increased time   TRANSFERS: Assistive device utilized: None  Sit to stand: Complete Independence Stand to sit: Complete Independence Chair to chair: Complete Independence Floor:  DNT       CURB:  Level of Assistance: Modified independence Assistive device utilized: Single point cane Curb Comments:        GAIT: Gait pattern: WFL Distance walked: pt report she is able to walk grocery store distances using shopping cart for support Assistive device utilized: Single point cane and None Level of assistance: Complete Independence and Modified independence Comments: reports use of cane for community distances, no AD for household use   FUNCTIONAL TESTs:  6 minute walk test: NT Berg Balance Scale: NT   PATIENT SURVEYS:      TODAY'S TREATMENT:  Evaluation, education, HEP initiation     PATIENT EDUCATION: Education details: use of anti-fatigue mats, support pillow for sleeping (pregnancy pillow), assessment findings Person educated: Patient Education method: Explanation and Handouts Education comprehension: needs further education     HOME EXERCISE PROGRAM: Access Code: [email or web address] URL: [email or web address] Date: 01/05/2022 Prepared  by:  Sherlyn Lees   Exercises - Supine Posterior Pelvic Tilt  - 1 x daily - 7 x weekly - 1-3 sets - 10 reps - 3 sec hold       GOALS: Goals reviewed with patient? Yes   SHORT TERM GOALS: Target date: 01/19/2022   Patient will be independent in HEP to improve functional outcomes with emphasis on core strength and hamstring flexibility Baseline: Goal status: IN PROGRESS       LONG TERM GOALS: Target date: 02/02/2022   Pt to report successful positions/strategies for sleeping posture to improve quality of sleep and reduce back pain Baseline:  Goal status: IN PROGRESS   2.  Demo trunk flexion strength 3-/5 for improve stability Baseline: 2+/5  Goal status: IN PROGRESS   3.  Report improved standing tolerance with use of anti-fatigue mat or other adaptations x 20 min to improve efficiency of housekeeping tasks Baseline: 5-10 min Goal status: IN PROGRESS   4.  Pt to report/demonstrate 3 strategies for self-management of chronic low back pain to improve comfort and QOL.  Baseline: main strategy at present is seated rest period Goal status: IN PROGRESS  5.  Pt to demonstrate safe floor transfer with mod I.   Baseline: NT Goal status: INITIAL  6.  Pt to score at least 46/56 on Berg to indicate decreased risk of falls.    Baseline: NT Goal status: INITIAL     ASSESSMENT:   CLINICAL IMPRESSION: Patient arrived to session with report of increased LBP today. Focused on addressing pain with gentle stretching activities. Able to perform hip strengthening with cueing for proper form and avoiding compensations. Patient reported 3-4/10 LBP after stretching, improved from start of session. Ended session with MT to further address pain, as patient reports good benefit from massage in the past. Patient reported feeling "much better" at end of session.     OBJECTIVE IMPAIRMENTS decreased activity tolerance, decreased endurance, difficulty walking, decreased strength, impaired perceived  functional ability, increased muscle spasms, impaired flexibility, improper body mechanics, postural dysfunction, and pain.    ACTIVITY LIMITATIONS carrying, standing, squatting, sleeping, bed mobility, and locomotion level   PARTICIPATION LIMITATIONS: meal prep, cleaning, laundry, and community activity   PERSONAL FACTORS Age, Time since onset of injury/illness/exacerbation, and 1 comorbidity: hx of lumbar fusion  are also affecting patient's functional outcome.    REHAB POTENTIAL: Good   CLINICAL DECISION MAKING: Stable/uncomplicated   EVALUATION COMPLEXITY: Low   PLAN: PT FREQUENCY: 1-2x/week   PT DURATION: 4 weeks   PLANNED INTERVENTIONS: Therapeutic exercises, Therapeutic activity, Neuromuscular re-education, Balance training, Gait training, Patient/Family education, Self Care, Joint mobilization, Stair training, Vestibular training, Canalith repositioning, Orthotic/Fit training, DME instructions, Aquatic Therapy, Electrical stimulation, Spinal mobilization, Cryotherapy, Moist heat, Taping, Traction, Ionotophoresis '4mg'$ /ml Dexamethasone, and Manual therapy   PLAN FOR NEXT SESSION: Berg Balance. Would like to be able to practice floor to stand transfers and walking on uneven surfaces to walk to her patio area     Janene Harvey, [place], DPT 01/20/22 11:02 AM  [facility] [place], [place], [place] [address] Phone # [phone] Fax # [phone]

## 2022-01-26 ENCOUNTER — Ambulatory Visit: Payer: Medicare Other | Attending: Physical Medicine and Rehabilitation

## 2022-01-26 DIAGNOSIS — M6281 Muscle weakness (generalized): Secondary | ICD-10-CM | POA: Diagnosis not present

## 2022-01-26 DIAGNOSIS — R2681 Unsteadiness on feet: Secondary | ICD-10-CM | POA: Insufficient documentation

## 2022-01-26 DIAGNOSIS — R2689 Other abnormalities of gait and mobility: Secondary | ICD-10-CM | POA: Diagnosis not present

## 2022-01-26 DIAGNOSIS — M545 Low back pain, unspecified: Secondary | ICD-10-CM | POA: Insufficient documentation

## 2022-01-26 DIAGNOSIS — R252 Cramp and spasm: Secondary | ICD-10-CM | POA: Insufficient documentation

## 2022-01-26 DIAGNOSIS — G8929 Other chronic pain: Secondary | ICD-10-CM | POA: Diagnosis not present

## 2022-01-26 NOTE — Therapy (Signed)
OUTPATIENT PHYSICAL THERAPY TREATMENT NOTE   Patient Name: [name] [name] MRN: [id number] DOB:02-13-1925, 88 y.o., female Today's Date: 01/27/2022  PCP:  Marin Olp, MD REFERRING PROVIDER: Izora Ribas, MD   END OF SESSION:   PT End of Session - 01/27/22 1055     Visit Number 7    Number of Visits 8    Date for PT Re-Evaluation 02/02/22    Authorization Type Medicare A/B; BCBS Supplemental 2023    PT Start Time 1018    PT Stop Time 1100    PT Time Calculation (min) 42 min    Activity Tolerance Patient tolerated treatment well;Patient limited by fatigue;Patient limited by pain    Behavior During Therapy [facility] for tasks assessed/performed                Past Medical History:  Diagnosis Date   Acute on chronic diastolic heart failure ([place]) 12/30/2020   Allergic rhinoconjunctivitis    Allergy    Anxiety    pt. managed- uses deep breathing    Arthritis    low back , stenosis   COLONIC POLYPS, RECURRENT 08/29/2006   2008 last colonoscopy. No further colonoscopy.      COPD (chronic obstructive pulmonary disease) (HCC)    Diverticulosis    Dysrhythmia    afib   Eczema    Environmental allergies    allergy shot- q friday in Dr. Janee Morn office. PFT's abnormal- recommended Spiriva to use preop & will d/c after surgery   GERD (gastroesophageal reflux disease)    Hiatal hernia    Hyperlipidemia    Hypertension    Lung nodule 2011   Paroxysmal atrial fibrillation ([place]) 11/27/2019   Peripheral vascular disease (HCC)    Shingles    Stenosis of popliteal artery (HCC)    blood clots in legs long ago     Trigeminal neuralgia    Left buttocks   Past Surgical History:  Procedure Laterality Date   ABDOMINAL AORTAGRAM N/A 06/17/2011   Procedure: ABDOMINAL Maxcine Ham;  Surgeon: Elam Dutch, MD;  Location: [facility] CATH LAB;  Service: Cardiovascular;  Laterality: N/A;   ABDOMINAL AORTOGRAM W/LOWER EXTREMITY Bilateral 06/22/2018   Procedure: ABDOMINAL AORTOGRAM W/LOWER  EXTREMITY;  Surgeon: Elam Dutch, MD;  Location: [place] CV LAB;  Service: Cardiovascular;  Laterality: Bilateral;   ABDOMINAL AORTOGRAM W/LOWER EXTREMITY Bilateral 11/29/2019   Procedure: ABDOMINAL AORTOGRAM W/LOWER EXTREMITY;  Surgeon: Elam Dutch, MD;  Location: [place] CV LAB;  Service: Cardiovascular;  Laterality: Bilateral;   CATARACT EXTRACTION, BILATERAL     w IOL   CHOLECYSTECTOMY  1998   COLONOSCOPY     ENDARTERECTOMY FEMORAL Left 12/09/2019   Procedure: ENDARTERECTOMY FEMORAL with bovine patch angioplasty.;  Surgeon: Elam Dutch, MD;  Location: [place];  Service: Vascular;  Laterality: Left;   FEMORAL-POPLITEAL BYPASS GRAFT        x2 surgeries 1990's & 2009   FEMORAL-POPLITEAL BYPASS GRAFT Right 12/09/2019   Procedure: REDO RIGHT FEMORAL-POPLITEAL ARTERY BYPASS GRAFT;  Surgeon: Elam Dutch, MD;  Location: [place];  Service: Vascular;  Laterality: Right;   INJECTION KNEE Right Aug. 2016   Gel injection for pain   LUMBAR FUSION  07/06/2011   TONSILLECTOMY     as a teenager    TUBAL LIGATION     Patient Active Problem List   Diagnosis Date Noted   Mild sleep apnea 05/03/2021   Chronic diastolic heart failure ([place]) 12/30/2020   Primary osteoarthritis of right  knee 06/02/2020   Left groin hernia 05/22/2020   Sleep disturbance 01/09/2020   Acute lower UTI    Anxiety state    Peripheral edema    Leukocytosis    Debility 12/13/2019   Dyslipidemia    Rectal bleeding    Acute blood loss anemia    Ischemic colitis ([place])    Status post femoral-popliteal bypass surgery 12/09/2019   Persistent atrial fibrillation ([place]) 12/03/2019   Secondary hypercoagulable state ([place]) 12/03/2019   Chronic bilateral low back pain with right-sided sciatica 08/29/2019   Chronic pain of right knee 08/29/2019   Abnormality of gait 05/30/2019   Neuropathic pain 05/30/2019   Myalgia 08/08/2018   Chronic pain syndrome 06/27/2018   Gross hematuria 06/01/2018   Obesity (BMI  30.0-34.9) 11/14/2017   Failed back syndrome 09/28/2017   CKD (chronic kidney disease), stage III ([place]) 08/07/2017   Unilateral primary osteoarthritis, right knee 04/07/2016   Acute gout of left foot 03/22/2016   Pain in right ankle and joints of right foot 08/28/2014   Trigeminal neuralgia    Encounter for therapeutic drug monitoring 10/21/2013   Syncope 12/29/2011   Chronic low back pain 09/13/2010   Allergic rhinitis due to pollen 06/25/2010   COPD mixed type ([place]) 01/22/2010   Solitary pulmonary nodule 01/12/2010   Insulin resistance 01/06/2009   Osteopenia 01/06/2009   Eczema 11/26/2007   Peripheral arterial disease ([place]) 06/29/2007   Hyperlipidemia 10/23/2006   Essential hypertension 10/23/2006   ESOPHAGEAL STRICTURE 08/09/1993    REFERRING DIAG: M79.10 (ICD-10-CM) - Myalgia   THERAPY DIAG:  Chronic bilateral low back pain without sciatica  Muscle weakness (generalized)  Other abnormalities of gait and mobility  Cramp and spasm  Rationale for Evaluation and Treatment Rehabilitation  PERTINENT HISTORY:  hx of lumbar fusion  PRECAUTIONS: None  SUBJECTIVE: Put some cream on her back and that is helping.   PAIN:  Are you having pain? Yes: NPRS scale: 2-3/10 Pain location: back pain Pain description: constant Aggravating factors: damp weather  Relieving factors: movement alleviates   OBJECTIVE:    TODAY'S TREATMENT: 01/27/22 Activity Comments  Nustepl L5 x 3 min Ues/Les, L3 x 3 min  Dropped resistance d/t fatigue   Sitting pelvic tilts 15x  Good tolerance  sitting HS stretch 20" each Good tolerance   STS with 8# at chest 2x10 Good control   3 way hip 10x each LE 2# Holding onto back of chair for support; good posture; most difficulty with hip extension  Standing Green TB row 2x15 Cues to contract core and retract scapulae   Standing Green TB shoulder extension 2x10 Cueing for rhythmic breathing     HOME EXERCISE PROGRAM Last updated: 01/13/22 Access  Code: [email or web address] URL: [email or web address] Date: 01/13/2022 Prepared by: [facility]  Exercises - Supine Posterior Pelvic Tilt  - 1 x daily - 7 x weekly - 1-3 sets - 10 reps - 3 sec hold - Hooklying Single Knee to Chest Stretch  - 1 x daily - 7 x weekly - 1 sets - 3 reps - 15-30 sec hold - Supine Lower Trunk Rotation  - 1 x daily - 7 x weekly - 3 sets - 10 reps - 15-30 sec hold - Sidelying Thoracic Lumbar Rotation  - 1 x daily - 7 x weekly - 1 sets - 3 reps - 15-30 sec hold - Staggered Stance Forward Backward Weight Shift with Counter Support  - 1-2 x daily - 7 x weekly -  1-2 sets - 10 reps - Hooklying Single Leg Bent Knee Fallouts with Resistance  - 1 x daily - 5 x weekly - 2 sets - 10 reps - Supine Bridge  - 1 x daily - 5 x weekly - 2 sets - 10 reps     ------------------------------------------------------------------------------- (objective measures completed at initial evaluation unless otherwise dated)   DIAGNOSTIC FINDINGS:  IMPRESSION: 1. L4-5 PLIF with resection of previously seen disc material. No residual spinal canal stenosis. 2. Unchanged moderate bilateral neural foraminal stenosis at L2-3 and L3-4. 3. Progression of severe right T11-12 and T12-L1 neural foraminal stenosis.   COGNITION: Overall cognitive status: Within functional limits for tasks assessed             SENSATION: WFL, denies parasthesias   COORDINATION: WNL   EDEMA:      MUSCLE TONE: WNL   Palpation:  lower thoracic/high lumbar paraspinals very tight and tender to palpation, decrease in lumbar lordosis notable     MUSCLE LENGTH: Left hamstrings tight and restricted vs right. Left SLR 35 degrees vs RLE SLR of 50-60 deg   DTRs:  DNT   POSTURE: No Significant postural limitations Side-sleeper, typically on the left      LOWER EXTREMITY ROM:    WFL   Lumbar ROM: flexion WFL                         Extension grossly limited                          Sidebending symmetrical 10% limited   LOWER EXTREMITY MMT:     MMT Right Eval Left Eval  Hip flexion 4 4  Hip extension      Hip abduction 4 4  Hip adduction      Hip internal rotation      Hip external rotation      Knee flexion 5 5  Knee extension 5 5  Ankle dorsiflexion 5 5  Ankle plantarflexion      Ankle inversion      Ankle eversion      (Blank rows = not tested)   Trunk flexion: 2+/5     BED MOBILITY:  Modified independent, increased time   TRANSFERS: Assistive device utilized: None  Sit to stand: Complete Independence Stand to sit: Complete Independence Chair to chair: Complete Independence Floor:  DNT       CURB:  Level of Assistance: Modified independence Assistive device utilized: Single point cane Curb Comments:        GAIT: Gait pattern: WFL Distance walked: pt report she is able to walk grocery store distances using shopping cart for support Assistive device utilized: Single point cane and None Level of assistance: Complete Independence and Modified independence Comments: reports use of cane for community distances, no AD for household use   FUNCTIONAL TESTs:  6 minute walk test: NT Berg Balance Scale: NT   PATIENT SURVEYS:      TODAY'S TREATMENT:  Evaluation, education, HEP initiation     PATIENT EDUCATION: Education details: use of anti-fatigue mats, support pillow for sleeping (pregnancy pillow), assessment findings Person educated: Patient Education method: Explanation and Handouts Education comprehension: needs further education     HOME EXERCISE PROGRAM: Access Code: [email or web address] URL: [email or web address] Date: 01/05/2022 Prepared by: Sherlyn Lees   Exercises - Supine Posterior Pelvic Tilt  - 1 x daily - 7 x weekly - 1-3 sets - 10 reps -  3 sec hold       GOALS: Goals reviewed with patient? Yes   SHORT TERM GOALS: Target date: 01/19/2022   Patient will be independent in HEP to improve  functional outcomes with emphasis on core strength and hamstring flexibility Baseline: Goal status: IN PROGRESS       LONG TERM GOALS: Target date: 02/02/2022   Pt to report successful positions/strategies for sleeping posture to improve quality of sleep and reduce back pain Baseline:  Goal status: IN PROGRESS   2.  Demo trunk flexion strength 3-/5 for improve stability Baseline: 2+/5  Goal status: IN PROGRESS   3.  Report improved standing tolerance with use of anti-fatigue mat or other adaptations x 20 min to improve efficiency of housekeeping tasks Baseline: 5-10 min Goal status: IN PROGRESS   4.  Pt to report/demonstrate 3 strategies for self-management of chronic low back pain to improve comfort and QOL.  Baseline: main strategy at present is seated rest period Goal status: IN PROGRESS  5.  Pt to demonstrate safe floor transfer with mod I.   Baseline: NT Goal status: INITIAL  6.  Pt to score at least 46/56 on Berg to indicate decreased risk of falls.    Baseline: NT Goal status: INITIAL     ASSESSMENT:   CLINICAL IMPRESSION: Patient arrived to session with report of improved LBP today. Worked on LE and core strengthening in sitting and standing to work on increasing functional activity tolerance. Patient demonstrated good posture and control with hip strengthening. Required standing/sitting rest breaks to address fatigue and LBP. Patient tolerated session very well despite more challenging activities and without complaints upon leaving.     OBJECTIVE IMPAIRMENTS decreased activity tolerance, decreased endurance, difficulty walking, decreased strength, impaired perceived functional ability, increased muscle spasms, impaired flexibility, improper body mechanics, postural dysfunction, and pain.    ACTIVITY LIMITATIONS carrying, standing, squatting, sleeping, bed mobility, and locomotion level   PARTICIPATION LIMITATIONS: meal prep, cleaning, laundry, and community  activity   PERSONAL FACTORS Age, Time since onset of injury/illness/exacerbation, and 1 comorbidity: hx of lumbar fusion  are also affecting patient's functional outcome.    REHAB POTENTIAL: Good   CLINICAL DECISION MAKING: Stable/uncomplicated   EVALUATION COMPLEXITY: Low   PLAN: PT FREQUENCY: 1-2x/week   PT DURATION: 4 weeks   PLANNED INTERVENTIONS: Therapeutic exercises, Therapeutic activity, Neuromuscular re-education, Balance training, Gait training, Patient/Family education, Self Care, Joint mobilization, Stair training, Vestibular training, Canalith repositioning, Orthotic/Fit training, DME instructions, Aquatic Therapy, Electrical stimulation, Spinal mobilization, Cryotherapy, Moist heat, Taping, Traction, Ionotophoresis '4mg'$ /ml Dexamethasone, and Manual therapy   PLAN FOR NEXT SESSION: Berg Balance. Would like to be able to practice floor to stand transfers and walking on uneven surfaces to walk to her patio area     Janene Harvey, [place], DPT 01/27/22 11:03 AM  [facility] at [facility] [place], [place], [place] [address] Phone # [phone] Fax # [phone]

## 2022-01-26 NOTE — Therapy (Signed)
OUTPATIENT PHYSICAL THERAPY TREATMENT NOTE   Patient Name: [name] [name] MRN: [id number] DOB:[birth year], 88 y.o., female Today's Date: 01/26/2022  PCP:  Marin Olp, MD REFERRING PROVIDER: Izora Ribas, MD   END OF SESSION:   PT End of Session - 01/26/22 1017     Visit Number 6    Number of Visits 8    Date for PT Re-Evaluation 02/02/22    Authorization Type Medicare A/B; BCBS Supplemental 2023    PT Start Time 4656    PT Stop Time 1100    PT Time Calculation (min) 45 min    Activity Tolerance Patient tolerated treatment well;Patient limited by pain    Behavior During Therapy [facility] for tasks assessed/performed               Past Medical History:  Diagnosis Date   Acute on chronic diastolic heart failure ([place]) 12/30/2020   Allergic rhinoconjunctivitis    Allergy    Anxiety    pt. managed- uses deep breathing    Arthritis    low back , stenosis   COLONIC POLYPS, RECURRENT 08/29/2006   2008 last colonoscopy. No further colonoscopy.      COPD (chronic obstructive pulmonary disease) (HCC)    Diverticulosis    Dysrhythmia    afib   Eczema    Environmental allergies    allergy shot- q friday in Dr. Janee Morn office. PFT's abnormal- recommended Spiriva to use preop & will d/c after surgery   GERD (gastroesophageal reflux disease)    Hiatal hernia    Hyperlipidemia    Hypertension    Lung nodule 2011   Paroxysmal atrial fibrillation ([place]) 11/27/2019   Peripheral vascular disease (HCC)    Shingles    Stenosis of popliteal artery (HCC)    blood clots in legs long ago     Trigeminal neuralgia    Left buttocks   Past Surgical History:  Procedure Laterality Date   ABDOMINAL AORTAGRAM N/A 06/17/2011   Procedure: ABDOMINAL Maxcine Ham;  Surgeon: Elam Dutch, MD;  Location: [facility] CATH LAB;  Service: Cardiovascular;  Laterality: N/A;   ABDOMINAL AORTOGRAM W/LOWER EXTREMITY Bilateral 06/22/2018   Procedure: ABDOMINAL AORTOGRAM W/LOWER EXTREMITY;  Surgeon: Elam Dutch, MD;  Location: [place] CV LAB;  Service: Cardiovascular;  Laterality: Bilateral;   ABDOMINAL AORTOGRAM W/LOWER EXTREMITY Bilateral 11/29/2019   Procedure: ABDOMINAL AORTOGRAM W/LOWER EXTREMITY;  Surgeon: Elam Dutch, MD;  Location: [place] CV LAB;  Service: Cardiovascular;  Laterality: Bilateral;   CATARACT EXTRACTION, BILATERAL     w IOL   CHOLECYSTECTOMY  1998   COLONOSCOPY     ENDARTERECTOMY FEMORAL Left 12/09/2019   Procedure: ENDARTERECTOMY FEMORAL with bovine patch angioplasty.;  Surgeon: Elam Dutch, MD;  Location: [place];  Service: Vascular;  Laterality: Left;   FEMORAL-POPLITEAL BYPASS GRAFT        x2 surgeries 1990's & 2009   FEMORAL-POPLITEAL BYPASS GRAFT Right 12/09/2019   Procedure: REDO RIGHT FEMORAL-POPLITEAL ARTERY BYPASS GRAFT;  Surgeon: Elam Dutch, MD;  Location: [place];  Service: Vascular;  Laterality: Right;   INJECTION KNEE Right Aug. 2016   Gel injection for pain   LUMBAR FUSION  07/06/2011   TONSILLECTOMY     as a teenager    TUBAL LIGATION     Patient Active Problem List   Diagnosis Date Noted   Mild sleep apnea 05/03/2021   Chronic diastolic heart failure ([place]) 12/30/2020   Primary osteoarthritis of right knee 06/02/2020  Left groin hernia 05/22/2020   Sleep disturbance 01/09/2020   Acute lower UTI    Anxiety state    Peripheral edema    Leukocytosis    Debility 12/13/2019   Dyslipidemia    Rectal bleeding    Acute blood loss anemia    Ischemic colitis ([place])    Status post femoral-popliteal bypass surgery 12/09/2019   Persistent atrial fibrillation ([place]) 12/03/2019   Secondary hypercoagulable state ([name]) 12/03/2019   Chronic bilateral low back pain with right-sided sciatica 08/29/2019   Chronic pain of right knee 08/29/2019   Abnormality of gait 05/30/2019   Neuropathic pain 05/30/2019   Myalgia 08/08/2018   Chronic pain syndrome 06/27/2018   Gross hematuria 06/01/2018   Obesity (BMI 30.0-34.9) 11/14/2017   Failed  back syndrome 09/28/2017   CKD (chronic kidney disease), stage III ([place]) 08/07/2017   Unilateral primary osteoarthritis, right knee 04/07/2016   Acute gout of left foot 03/22/2016   Pain in right ankle and joints of right foot 08/28/2014   Trigeminal neuralgia    Encounter for therapeutic drug monitoring 10/21/2013   Syncope 12/29/2011   Chronic low back pain 09/13/2010   Allergic rhinitis due to pollen 06/25/2010   COPD mixed type ([place]) 01/22/2010   Solitary pulmonary nodule 01/12/2010   Insulin resistance 01/06/2009   Osteopenia 01/06/2009   Eczema 11/26/2007   Peripheral arterial disease ([place]) 06/29/2007   Hyperlipidemia 10/23/2006   Essential hypertension 10/23/2006   ESOPHAGEAL STRICTURE 08/09/1993    REFERRING DIAG: M79.10 (ICD-10-CM) - Myalgia   THERAPY DIAG:  Chronic bilateral low back pain without sciatica  Muscle weakness (generalized)  Other abnormalities of gait and mobility  Rationale for Evaluation and Treatment Rehabilitation  PERTINENT HISTORY:  hx of lumbar fusion  PRECAUTIONS: None  SUBJECTIVE: Low back is hurting worse today, worst it's been for a while. May have overdone it with a lunch party yesterday  PAIN:  Are you having pain? Yes: NPRS scale: 7/10 Pain location: back pain Pain description: constant Aggravating factors: activity Relieving factors: movement alleviates   OBJECTIVE:   TODAY'S TREATMENT: 01/26/22 Activity Comments  Seated LE ther ex 3x10 AROM all joints/mm groups MHP applied to low back during this time  Green pball prayer stretch 15x3-5" Increased stretch with lateral deviations  Abdominal isometric via physioball pushdowns 2x12, standing ball on table   Rolling physioball up/down wall 1x15 For lumbar extension  Biofreeze applied to lumbar region          TODAY'S TREATMENT: 01/20/22 Activity Comments  Nustep L2x6 min Ues/LEs Maintaining 80SPM  Green pball prayer stretch 10x3-5" Good tolerance and report of relief  R/L  open book stretch 10x each To tolerance   Sidelying clamshell 2x10 Cues to roll forward and avoid compensating with LB; C/o difficulty on last few reps   LTR 20x C/o difficulty on last few reps   Pelvic tilts 10x Manual cueing for improved form/understanding  STM to B lumbar paraspinals, glutes, piriformis Soft tissue restriction and TTP on superior glutes, piriformis, lumbar paraspinals          PATIENT EDUCATION: Education details: answering patient's questions on how to reduce/manage pain at home Person educated: Patient Education method: Explanation Education comprehension: verbalized understanding   HOME EXERCISE PROGRAM Last updated: 01/13/22 Access Code: [email or web address] URL: [email or web address] Date: 01/13/2022 Prepared by: [facility]  Exercises - Supine Posterior Pelvic Tilt  - 1 x daily - 7 x weekly - 1-3 sets - 10 reps -  3 sec hold - Hooklying Single Knee to Chest Stretch  - 1 x daily - 7 x weekly - 1 sets - 3 reps - 15-30 sec hold - Supine Lower Trunk Rotation  - 1 x daily - 7 x weekly - 3 sets - 10 reps - 15-30 sec hold - Sidelying Thoracic Lumbar Rotation  - 1 x daily - 7 x weekly - 1 sets - 3 reps - 15-30 sec hold - Staggered Stance Forward Backward Weight Shift with Counter Support  - 1-2 x daily - 7 x weekly - 1-2 sets - 10 reps - Hooklying Single Leg Bent Knee Fallouts with Resistance  - 1 x daily - 5 x weekly - 2 sets - 10 reps - Supine Bridge  - 1 x daily - 5 x weekly - 2 sets - 10 reps     ------------------------------------------------------------------------------- (objective measures completed at initial evaluation unless otherwise dated)   DIAGNOSTIC FINDINGS:  IMPRESSION: 1. L4-5 PLIF with resection of previously seen disc material. No residual spinal canal stenosis. 2. Unchanged moderate bilateral neural foraminal stenosis at L2-3 and L3-4. 3. Progression of severe right T11-12 and T12-L1 neural  foraminal stenosis.   COGNITION: Overall cognitive status: Within functional limits for tasks assessed             SENSATION: WFL, denies parasthesias   COORDINATION: WNL   EDEMA:      MUSCLE TONE: WNL   Palpation:  lower thoracic/high lumbar paraspinals very tight and tender to palpation, decrease in lumbar lordosis notable     MUSCLE LENGTH: Left hamstrings tight and restricted vs right. Left SLR 35 degrees vs RLE SLR of 50-60 deg   DTRs:  DNT   POSTURE: No Significant postural limitations Side-sleeper, typically on the left      LOWER EXTREMITY ROM:    WFL   Lumbar ROM: flexion WFL                         Extension grossly limited                         Sidebending symmetrical 10% limited   LOWER EXTREMITY MMT:     MMT Right Eval Left Eval  Hip flexion 4 4  Hip extension      Hip abduction 4 4  Hip adduction      Hip internal rotation      Hip external rotation      Knee flexion 5 5  Knee extension 5 5  Ankle dorsiflexion 5 5  Ankle plantarflexion      Ankle inversion      Ankle eversion      (Blank rows = not tested)   Trunk flexion: 2+/5     BED MOBILITY:  Modified independent, increased time   TRANSFERS: Assistive device utilized: None  Sit to stand: Complete Independence Stand to sit: Complete Independence Chair to chair: Complete Independence Floor:  DNT       CURB:  Level of Assistance: Modified independence Assistive device utilized: Single point cane Curb Comments:        GAIT: Gait pattern: WFL Distance walked: pt report she is able to walk grocery store distances using shopping cart for support Assistive device utilized: Single point cane and None Level of assistance: Complete Independence and Modified independence Comments: reports use of cane for community distances, no AD for household use   FUNCTIONAL TESTs:  6 minute  walk test: NT Berg Balance Scale: NT   PATIENT SURVEYS:      TODAY'S TREATMENT:   Evaluation, education, HEP initiation     PATIENT EDUCATION: Education details: use of anti-fatigue mats, support pillow for sleeping (pregnancy pillow), assessment findings Person educated: Patient Education method: Explanation and Handouts Education comprehension: needs further education     HOME EXERCISE PROGRAM: Access Code: [email or web address] URL: [email or web address] Date: 01/05/2022 Prepared by: Sherlyn Lees   Exercises - Supine Posterior Pelvic Tilt  - 1 x daily - 7 x weekly - 1-3 sets - 10 reps - 3 sec hold       GOALS: Goals reviewed with patient? Yes   SHORT TERM GOALS: Target date: 01/19/2022   Patient will be independent in HEP to improve functional outcomes with emphasis on core strength and hamstring flexibility Baseline: Goal status: IN PROGRESS       LONG TERM GOALS: Target date: 02/02/2022   Pt to report successful positions/strategies for sleeping posture to improve quality of sleep and reduce back pain Baseline:  Goal status: IN PROGRESS   2.  Demo trunk flexion strength 3-/5 for improve stability Baseline: 2+/5  Goal status: IN PROGRESS   3.  Report improved standing tolerance with use of anti-fatigue mat or other adaptations x 20 min to improve efficiency of housekeeping tasks Baseline: 5-10 min Goal status: IN PROGRESS   4.  Pt to report/demonstrate 3 strategies for self-management of chronic low back pain to improve comfort and QOL.  Baseline: main strategy at present is seated rest period Goal status: IN PROGRESS  5.  Pt to demonstrate safe floor transfer with mod I.   Baseline: NT Goal status: INITIAL  6.  Pt to score at least 46/56 on Berg to indicate decreased risk of falls.    Baseline: NT Goal status: INITIAL     ASSESSMENT:   CLINICAL IMPRESSION: Pt reports increased LBP from increased activity yesteday which required a lot of static standing at kitchen counter.  Activities initiated in sitting and with hot pack to  good effect and proceeded with gentle flexion-extension ROM activities and light isometrics for stability. Pt reports decrease in pain by end of session.     OBJECTIVE IMPAIRMENTS decreased activity tolerance, decreased endurance, difficulty walking, decreased strength, impaired perceived functional ability, increased muscle spasms, impaired flexibility, improper body mechanics, postural dysfunction, and pain.    ACTIVITY LIMITATIONS carrying, standing, squatting, sleeping, bed mobility, and locomotion level   PARTICIPATION LIMITATIONS: meal prep, cleaning, laundry, and community activity   PERSONAL FACTORS Age, Time since onset of injury/illness/exacerbation, and 1 comorbidity: hx of lumbar fusion  are also affecting patient's functional outcome.    REHAB POTENTIAL: Good   CLINICAL DECISION MAKING: Stable/uncomplicated   EVALUATION COMPLEXITY: Low   PLAN: PT FREQUENCY: 1-2x/week   PT DURATION: 4 weeks   PLANNED INTERVENTIONS: Therapeutic exercises, Therapeutic activity, Neuromuscular re-education, Balance training, Gait training, Patient/Family education, Self Care, Joint mobilization, Stair training, Vestibular training, Canalith repositioning, Orthotic/Fit training, DME instructions, Aquatic Therapy, Electrical stimulation, Spinal mobilization, Cryotherapy, Moist heat, Taping, Traction, Ionotophoresis '4mg'$ /ml Dexamethasone, and Manual therapy   PLAN FOR NEXT SESSION: Berg Balance. Would like to be able to practice floor to stand transfers and walking on uneven surfaces to walk to her patio area    12:06 PM, 01/26/22 M. Sherlyn Lees, PT, DPT Physical Therapist- [place] Office Number: [phone]   [place] at [facility] [address], [place], [place] [address] Phone # [phone]  Fax # [phone]

## 2022-01-27 ENCOUNTER — Ambulatory Visit: Payer: Medicare Other | Admitting: Physical Therapy

## 2022-01-27 ENCOUNTER — Encounter: Payer: Self-pay | Admitting: Physical Therapy

## 2022-01-27 DIAGNOSIS — R252 Cramp and spasm: Secondary | ICD-10-CM | POA: Diagnosis not present

## 2022-01-27 DIAGNOSIS — G8929 Other chronic pain: Secondary | ICD-10-CM

## 2022-01-27 DIAGNOSIS — M6281 Muscle weakness (generalized): Secondary | ICD-10-CM

## 2022-01-27 DIAGNOSIS — M545 Low back pain, unspecified: Secondary | ICD-10-CM | POA: Diagnosis not present

## 2022-01-27 DIAGNOSIS — R2681 Unsteadiness on feet: Secondary | ICD-10-CM | POA: Diagnosis not present

## 2022-01-27 DIAGNOSIS — R2689 Other abnormalities of gait and mobility: Secondary | ICD-10-CM | POA: Diagnosis not present

## 2022-01-28 NOTE — Therapy (Signed)
OUTPATIENT PHYSICAL THERAPY PROGRESS NOTE/RE-CERT   Patient Name: [name] [name] MRN: [id number] DOB:02-May-1924, 88 y.o., female Today's Date: 01/31/2022  PCP:  Marin Olp, MD REFERRING PROVIDER: Izora Ribas, MD    Progress Note Reporting Period 01/05/22 to 01/922  See note below for Objective Data and Assessment of Progress/Goals.     END OF SESSION:   PT End of Session - 01/31/22 1629     Visit Number 8    Number of Visits 16    Date for PT Re-Evaluation 02/28/22    Authorization Type Medicare A/B; BCBS Supplemental 2023    Progress Note Due on Visit 18    PT Start Time 1402    PT Stop Time 1447    PT Time Calculation (min) 45 min    Equipment Utilized During Treatment Gait belt    Activity Tolerance Patient tolerated treatment well    Behavior During Therapy WFL for tasks assessed/performed                 Past Medical History:  Diagnosis Date   Acute on chronic diastolic heart failure ([place]) 12/30/2020   Allergic rhinoconjunctivitis    Allergy    Anxiety    pt. managed- uses deep breathing    Arthritis    low back , stenosis   COLONIC POLYPS, RECURRENT 08/29/2006   2008 last colonoscopy. No further colonoscopy.      COPD (chronic obstructive pulmonary disease) (HCC)    Diverticulosis    Dysrhythmia    afib   Eczema    Environmental allergies    allergy shot- q friday in Dr. Janee Morn office. PFT's abnormal- recommended Spiriva to use preop & will d/c after surgery   GERD (gastroesophageal reflux disease)    Hiatal hernia    Hyperlipidemia    Hypertension    Lung nodule 2011   Paroxysmal atrial fibrillation ([place]) 11/27/2019   Peripheral vascular disease (HCC)    Shingles    Stenosis of popliteal artery (HCC)    blood clots in legs long ago     Trigeminal neuralgia    Left buttocks   Past Surgical History:  Procedure Laterality Date   ABDOMINAL AORTAGRAM N/A 06/17/2011   Procedure: ABDOMINAL Maxcine Ham;  Surgeon: Elam Dutch,  MD;  Location: [facility] CATH LAB;  Service: Cardiovascular;  Laterality: N/A;   ABDOMINAL AORTOGRAM W/LOWER EXTREMITY Bilateral 06/22/2018   Procedure: ABDOMINAL AORTOGRAM W/LOWER EXTREMITY;  Surgeon: Elam Dutch, MD;  Location: [facility];  Service: Cardiovascular;  Laterality: Bilateral;   ABDOMINAL AORTOGRAM W/LOWER EXTREMITY Bilateral 11/29/2019   Procedure: ABDOMINAL AORTOGRAM W/LOWER EXTREMITY;  Surgeon: Elam Dutch, MD;  Location: [place] [facility];  Service: Cardiovascular;  Laterality: Bilateral;   CATARACT EXTRACTION, BILATERAL     w IOL   CHOLECYSTECTOMY  1998   COLONOSCOPY     ENDARTERECTOMY FEMORAL Left 12/09/2019   Procedure: ENDARTERECTOMY FEMORAL with bovine patch angioplasty.;  Surgeon: Elam Dutch, MD;  Location: [place];  Service: Vascular;  Laterality: Left;   FEMORAL-POPLITEAL BYPASS GRAFT        x2 surgeries 1990's & 2009   FEMORAL-POPLITEAL BYPASS GRAFT Right 12/09/2019   Procedure: REDO RIGHT FEMORAL-POPLITEAL ARTERY BYPASS GRAFT;  Surgeon: Elam Dutch, MD;  Location: [place];  Service: Vascular;  Laterality: Right;   INJECTION KNEE Right Aug. 2016   Gel injection for pain   LUMBAR FUSION  07/06/2011   TONSILLECTOMY     as a teenager  TUBAL LIGATION     Patient Active Problem List   Diagnosis Date Noted   Mild sleep apnea 05/03/2021   Chronic diastolic heart failure ([place]) 12/30/2020   Primary osteoarthritis of right knee 06/02/2020   Left groin hernia 05/22/2020   Sleep disturbance 01/09/2020   Acute lower UTI    Anxiety state    Peripheral edema    Leukocytosis    Debility 12/13/2019   Dyslipidemia    Rectal bleeding    Acute blood loss anemia    Ischemic colitis ([place])    Status post femoral-popliteal bypass surgery 12/09/2019   Persistent atrial fibrillation ([place]) 12/03/2019   Secondary hypercoagulable state ([place]) 12/03/2019   Chronic bilateral low back pain with right-sided sciatica 08/29/2019   Chronic pain of right knee  08/29/2019   Abnormality of gait 05/30/2019   Neuropathic pain 05/30/2019   Myalgia 08/08/2018   Chronic pain syndrome 06/27/2018   Gross hematuria 06/01/2018   Obesity (BMI 30.0-34.9) 11/14/2017   Failed back syndrome 09/28/2017   CKD (chronic kidney disease), stage III ([place]) 08/07/2017   Unilateral primary osteoarthritis, right knee 04/07/2016   Acute gout of left foot 03/22/2016   Pain in right ankle and joints of right foot 08/28/2014   Trigeminal neuralgia    Encounter for therapeutic drug monitoring 10/21/2013   Syncope 12/29/2011   Chronic low back pain 09/13/2010   Allergic rhinitis due to pollen 06/25/2010   COPD mixed type ([place]) 01/22/2010   Solitary pulmonary nodule 01/12/2010   Insulin resistance 01/06/2009   Osteopenia 01/06/2009   Eczema 11/26/2007   Peripheral arterial disease ([place]) 06/29/2007   Hyperlipidemia 10/23/2006   Essential hypertension 10/23/2006   ESOPHAGEAL STRICTURE 08/09/1993    REFERRING DIAG: M79.10 (ICD-10-CM) - Myalgia   THERAPY DIAG:  Chronic bilateral low back pain without sciatica  Muscle weakness (generalized)  Unsteadiness on feet  Other abnormalities of gait and mobility  Rationale for Evaluation and Treatment Rehabilitation  PERTINENT HISTORY:  hx of lumbar fusion  PRECAUTIONS: None  SUBJECTIVE: Back is feeling pretty good. Reports that her pain is less than it was before starting therapy. Reports 2-3 minutes of standing before worsening of LBP. Has not yet purchased an anti-fatigue mat. Was on the phone for 30 minutes waiting to speak to her vascular surgeon.   PAIN:  Are you having pain? Yes: NPRS scale: 1-2/10 Pain location: back pain Pain description: constant Aggravating factors: damp weather  Relieving factors: movement alleviates   OBJECTIVE:     TODAY'S TREATMENT: 01/31/22 Activity Comments  Nustep L3 x 6 min Ues/Les  Maintaining 80 SPM  Berg 44/56   Floor transfers Min A to floor, heavy verbal cueing and  tactile cueing as well as mod A to stand using mat       [facility] PT Assessment - 01/31/22 0001       Standardized Balance Assessment   Standardized Balance Assessment Berg Balance Test      Berg Balance Test   Sit to Stand Able to stand without using hands and stabilize independently    Standing Unsupported Able to stand safely 2 minutes    Sitting with Back Unsupported but Feet Supported on Floor or Stool Able to sit safely and securely 2 minutes    Stand to Sit Sits safely with minimal use of hands    Transfers Able to transfer safely, minor use of hands    Standing Unsupported with Eyes Closed Able to stand 10 seconds with supervision    Standing  Unsupported with Feet Together Able to place feet together independently and stand for 1 minute with supervision    From Standing, Reach Forward with Outstretched Arm Can reach confidently >25 cm (10")    From Standing Position, Pick up Object from [place] to pick up shoe, needs supervision   c/o swimmyheadedness   From Standing Position, Turn to Look Behind Over each Shoulder Looks behind from both sides and weight shifts well    Turn 360 Degrees Able to turn 360 degrees safely in 4 seconds or less    Standing Unsupported, Alternately Place Feet on Step/Stool Able to complete >2 steps/needs minimal assist    Standing Unsupported, One Foot in [place] to take small step independently and hold 30 seconds   able to maintain tandem 9 sec   Standing on One Leg Unable to try or needs assist to prevent fall    Total Score 44             PATIENT EDUCATION: Education details: edu on POC, discussion on progress towards goals  Person educated: Patient Education method: Explanation Education comprehension: verbalized understanding     HOME EXERCISE PROGRAM Last updated: 01/13/22 Access Code: [email or web address] URL: [email or web address] Date: 01/13/2022 Prepared by: [facility]  Exercises -  Supine Posterior Pelvic Tilt  - 1 x daily - 7 x weekly - 1-3 sets - 10 reps - 3 sec hold - Hooklying Single Knee to Chest Stretch  - 1 x daily - 7 x weekly - 1 sets - 3 reps - 15-30 sec hold - Supine Lower Trunk Rotation  - 1 x daily - 7 x weekly - 3 sets - 10 reps - 15-30 sec hold - Sidelying Thoracic Lumbar Rotation  - 1 x daily - 7 x weekly - 1 sets - 3 reps - 15-30 sec hold - Staggered Stance Forward Backward Weight Shift with Counter Support  - 1-2 x daily - 7 x weekly - 1-2 sets - 10 reps - Hooklying Single Leg Bent Knee Fallouts with Resistance  - 1 x daily - 5 x weekly - 2 sets - 10 reps - Supine Bridge  - 1 x daily - 5 x weekly - 2 sets - 10 reps     ------------------------------------------------------------------------------- (objective measures completed at initial evaluation unless otherwise dated)   DIAGNOSTIC FINDINGS:  IMPRESSION: 1. L4-5 PLIF with resection of previously seen disc material. No residual spinal canal stenosis. 2. Unchanged moderate bilateral neural foraminal stenosis at L2-3 and L3-4. 3. Progression of severe right T11-12 and T12-L1 neural foraminal stenosis.   COGNITION: Overall cognitive status: Within functional limits for tasks assessed             SENSATION: WFL, denies parasthesias   COORDINATION: WNL   EDEMA:      MUSCLE TONE: WNL   Palpation:  lower thoracic/high lumbar paraspinals very tight and tender to palpation, decrease in lumbar lordosis notable     MUSCLE LENGTH: Left hamstrings tight and restricted vs right. Left SLR 35 degrees vs RLE SLR of 50-60 deg   DTRs:  DNT   POSTURE: No Significant postural limitations Side-sleeper, typically on the left      LOWER EXTREMITY ROM:    WFL   Lumbar ROM: flexion WFL                         Extension grossly limited  Sidebending symmetrical 10% limited   LOWER EXTREMITY MMT:     MMT Right Eval Left Eval  Hip flexion 4 4  Hip extension      Hip  abduction 4 4  Hip adduction      Hip internal rotation      Hip external rotation      Knee flexion 5 5  Knee extension 5 5  Ankle dorsiflexion 5 5  Ankle plantarflexion      Ankle inversion      Ankle eversion      (Blank rows = not tested)   Trunk flexion: 2+/5     BED MOBILITY:  Modified independent, increased time   TRANSFERS: Assistive device utilized: None  Sit to stand: Complete Independence Stand to sit: Complete Independence Chair to chair: Complete Independence Floor:  DNT       CURB:  Level of Assistance: Modified independence Assistive device utilized: Single point cane Curb Comments:        GAIT: Gait pattern: WFL Distance walked: pt report she is able to walk grocery store distances using shopping cart for support Assistive device utilized: Single point cane and None Level of assistance: Complete Independence and Modified independence Comments: reports use of cane for community distances, no AD for household use   FUNCTIONAL TESTs:  6 minute walk test: NT Berg Balance Scale: NT   PATIENT SURVEYS:      TODAY'S TREATMENT:  Evaluation, education, HEP initiation     PATIENT EDUCATION: Education details: use of anti-fatigue mats, support pillow for sleeping (pregnancy pillow), assessment findings Person educated: Patient Education method: Explanation and Handouts Education comprehension: needs further education     HOME EXERCISE PROGRAM: Access Code: [email or web address] URL: [email or web address] Date: 01/05/2022 Prepared by: Sherlyn Lees   Exercises - Supine Posterior Pelvic Tilt  - 1 x daily - 7 x weekly - 1-3 sets - 10 reps - 3 sec hold       GOALS: Goals reviewed with patient? Yes   SHORT TERM GOALS: Target date: 01/19/2022   Patient will be independent in HEP to improve functional outcomes with emphasis on core strength and hamstring flexibility Baseline: Goal status: MET for current HEP 01/31/22       LONG TERM GOALS:  Target date: 02/28/2022   Pt to report successful positions/strategies for sleeping posture to improve quality of sleep and reduce back pain Baseline: Reports occasional LBP at night- reports that laying on either side is the most comfortable.  Goal status: MET 01/31/22   2.  Demo trunk flexion strength 3-/5 for improve stability Baseline: 2+/5; NT 01/31/22 Goal status: IN PROGRESS   3.  Report improved standing tolerance with use of anti-fatigue mat or other adaptations x 20 min to improve efficiency of housekeeping tasks Baseline: 5-10 min; reports 2-3 minutes 01/31/22 Goal status: IN PROGRESS 01/31/22   4.  Pt to report/demonstrate 3 strategies for self-management of chronic low back pain to improve comfort and QOL.  Baseline: main strategy at present is seated rest period;  Goal status: MET 01/31/22  5.  Pt to demonstrate safe floor transfer with mod I.   Baseline: required min A to get down to floor, mod A and heavy verbal-tactile cueing to stand up 01/31/22 Goal status: IN PROGRESS 01/31/22  6.  Pt to score at least 46/56 on Berg to indicate decreased risk of falls.    Baseline: 44/56 01/31/22 Goal status: IN PROGRESS 01/31/22     ASSESSMENT:   CLINICAL IMPRESSION: Patient arrived  to session with report of improved LBP since starting therapy. Reports standing tolerance is still limited to 2-3 minutes and has not yet purchased an antifatigue mat. Reports occasional LBP at night- reports that laying on either side is the most comfortable. Patient was able to recall 3 strategies to manage LBP at home with some cueing. Patient scored 44/56 on Berg, indicating increased risk of falls. Most difficulty was evident with balance with EC and SLS activities. Trialed and educated patient on floor transfer which required min A to get down to floor, mod A and heavy verbal-tactile cueing to stand up. Patient is demonstrating good progress towards goals; would benefit from additional skilled PT  services 2x/week for 4 weeks to address remaining imbalance and weakness limiting functional transfers.     OBJECTIVE IMPAIRMENTS decreased activity tolerance, decreased endurance, difficulty walking, decreased strength, impaired perceived functional ability, increased muscle spasms, impaired flexibility, improper body mechanics, postural dysfunction, and pain.    ACTIVITY LIMITATIONS carrying, standing, squatting, sleeping, bed mobility, and locomotion level   PARTICIPATION LIMITATIONS: meal prep, cleaning, laundry, and community activity   PERSONAL FACTORS Age, Time since onset of injury/illness/exacerbation, and 1 comorbidity: hx of lumbar fusion  are also affecting patient's functional outcome.    REHAB POTENTIAL: Good   CLINICAL DECISION MAKING: Stable/uncomplicated   EVALUATION COMPLEXITY: Low   PLAN: PT FREQUENCY: 12x/week   PT DURATION: 4 weeks   PLANNED INTERVENTIONS: Therapeutic exercises, Therapeutic activity, Neuromuscular re-education, Balance training, Gait training, Patient/Family education, Self Care, Joint mobilization, Stair training, Vestibular training, Canalith repositioning, Orthotic/Fit training, DME instructions, Aquatic Therapy, Electrical stimulation, Spinal mobilization, Cryotherapy, Moist heat, Taping, Traction, Ionotophoresis 54m/ml Dexamethasone, and Manual therapy   PLAN FOR NEXT SESSION: practice balance activities with EC and SLS/stepping activities and update into HEP as appropriate; work on components of floor transfers     YJanene Harvey PT, DPT 01/31/22 4:38 PM  [facility] SA[place]on[place] [address]Phone # [phone] # [phone]

## 2022-01-31 ENCOUNTER — Telehealth: Payer: Self-pay | Admitting: Orthopaedic Surgery

## 2022-01-31 ENCOUNTER — Ambulatory Visit: Payer: Medicare Other | Admitting: Physical Therapy

## 2022-01-31 ENCOUNTER — Encounter: Payer: Self-pay | Admitting: Physical Therapy

## 2022-01-31 DIAGNOSIS — R2681 Unsteadiness on feet: Secondary | ICD-10-CM

## 2022-01-31 DIAGNOSIS — G8929 Other chronic pain: Secondary | ICD-10-CM | POA: Diagnosis not present

## 2022-01-31 DIAGNOSIS — R2689 Other abnormalities of gait and mobility: Secondary | ICD-10-CM | POA: Diagnosis not present

## 2022-01-31 DIAGNOSIS — M545 Low back pain, unspecified: Secondary | ICD-10-CM | POA: Diagnosis not present

## 2022-01-31 DIAGNOSIS — R252 Cramp and spasm: Secondary | ICD-10-CM | POA: Diagnosis not present

## 2022-01-31 DIAGNOSIS — M6281 Muscle weakness (generalized): Secondary | ICD-10-CM | POA: Diagnosis not present

## 2022-01-31 NOTE — Telephone Encounter (Signed)
Noted. Appt.scheduled for gel injection.

## 2022-01-31 NOTE — Telephone Encounter (Signed)
Pt called. For [name] J. Seen referral for right knee gel injection approval. Last attempt to call pt 01/19/22

## 2022-02-01 ENCOUNTER — Telehealth: Payer: Self-pay

## 2022-02-01 ENCOUNTER — Ambulatory Visit: Payer: Medicare Other

## 2022-02-01 DIAGNOSIS — I739 Peripheral vascular disease, unspecified: Secondary | ICD-10-CM

## 2022-02-01 NOTE — Telephone Encounter (Signed)
Pt called stating that she didn't think she could wait until January to be seen and wanted an earlier appt.  Reviewed pt's chart, returned call for clarification, two identifiers used. Pt states that her feet are sore, achy, and sometimes feel like she's "walking on rocks". She also has leg swelling. Appts scheduled from recall for [place] and MD with pt availability. Confirmed understanding.

## 2022-02-03 ENCOUNTER — Ambulatory Visit: Payer: Medicare Other

## 2022-02-03 ENCOUNTER — Ambulatory Visit (INDEPENDENT_AMBULATORY_CARE_PROVIDER_SITE_OTHER): Payer: Medicare Other

## 2022-02-03 VITALS — BP 122/64 | HR 99 | Temp 98.0°F | Wt 168.0 lb

## 2022-02-03 DIAGNOSIS — R252 Cramp and spasm: Secondary | ICD-10-CM | POA: Diagnosis not present

## 2022-02-03 DIAGNOSIS — M545 Low back pain, unspecified: Secondary | ICD-10-CM

## 2022-02-03 DIAGNOSIS — M6281 Muscle weakness (generalized): Secondary | ICD-10-CM

## 2022-02-03 DIAGNOSIS — R2681 Unsteadiness on feet: Secondary | ICD-10-CM | POA: Diagnosis not present

## 2022-02-03 DIAGNOSIS — Z0000 Encounter for general adult medical examination without abnormal findings: Secondary | ICD-10-CM | POA: Diagnosis not present

## 2022-02-03 DIAGNOSIS — R2689 Other abnormalities of gait and mobility: Secondary | ICD-10-CM | POA: Diagnosis not present

## 2022-02-03 DIAGNOSIS — G8929 Other chronic pain: Secondary | ICD-10-CM | POA: Diagnosis not present

## 2022-02-03 NOTE — Progress Notes (Signed)
Subjective:   [name] [name] is a 88 y.o. female who presents for Medicare Annual (Subsequent) preventive examination.  Review of Systems     Cardiac Risk Factors include: advanced age (>27mn, >>41women);dyslipidemia;hypertension;obesity (BMI >30kg/m2)     Objective:    Today's Vitals   02/03/22 1251  BP: 122/64  Pulse: 99  Temp: 98 F (36.7 C)  SpO2: 93%  Weight: 168 lb (76.2 kg)   Body mass index is 33.93 kg/m.     02/03/2022    1:05 PM 11/02/2021   10:17 AM 05/18/2021    1:50 PM 03/03/2021    9:49 AM 01/21/2021   11:06 AM 09/09/2020   11:52 AM 08/28/2020   10:20 AM  Advanced Directives  Does Patient Have a Medical Advance Directive? Yes Yes Yes No Yes Yes Yes  Type of AParamedicof AUnityLiving will HVilla ParkLiving will [place]Living will;Out of facility DNR (pink MOST or yellow form)  [place]Living will [place]Living will   Does patient want to make changes to medical advance directive? No - Patient declined     No - Patient declined   Copy of [place]in Chart? Yes - validated most recent copy scanned in chart (See row information)    No - copy requested      Current Medications (verified) Outpatient Encounter Medications as of 02/03/2022  Medication Sig   acetaminophen (TYLENOL) 325 MG tablet Take 1-2 tablets (325-650 mg total) by mouth every 4 (four) hours as needed for mild pain (or temp >/= 101 F).   apixaban (ELIQUIS) 5 MG TABS tablet Take 1 tablet by mouth twice daily   atorvastatin (LIPITOR) 40 MG tablet Take 1 tablet by mouth once daily   baclofen (LIORESAL) 10 MG tablet TAKE 1/2 (ONE-HALF) TABLET BY MOUTH NIGHTLY   famotidine (PEPCID) 20 MG tablet Take '20mg'$  in the evening for 1 week. Then take as needed for indigestion.   furosemide (LASIX) 40 MG tablet Take 1 tablet (40 mg total) by mouth daily.   ipratropium (ATROVENT) 0.06  % nasal spray Place 2 sprays into the nose 2 (two) times daily.   lidocaine (LIDODERM) 5 % Place 1 patch onto the skin daily. Remove & Discard patch within 12 hours or as directed by MD   lisinopril (ZESTRIL) 5 MG tablet Take 1 tablet (5 mg total) by mouth daily.   metoprolol succinate (TOPROL-XL) 100 MG 24 hr tablet Take 1 tablet (100 mg total) by mouth daily. Take with or immediately following a meal.   Multiple Vitamin (MULTIVITAMIN WITH MINERALS) TABS tablet Take 1 tablet by mouth daily.   pantoprazole (PROTONIX) 40 MG tablet Take one tablet twice daily as directed   RESTASIS 0.05 % ophthalmic emulsion Place 1 drop into both eyes 2 (two) times daily as needed (dry eyes).    No facility-administered encounter medications on file as of 02/03/2022.    Allergies (verified) Sulfa antibiotics, Tiotropium bromide, Latex, Ultram [tramadol], and 5-alpha reductase inhibitors   History: Past Medical History:  Diagnosis Date   Acute on chronic diastolic heart failure ([place] 12/30/2020   Allergic rhinoconjunctivitis    Allergy    Anxiety    pt. managed- uses deep breathing    Arthritis    low back , stenosis   COLONIC POLYPS, RECURRENT 08/29/2006   2008 last colonoscopy. No further colonoscopy.      COPD (chronic obstructive pulmonary disease) ([place]auville  Diverticulosis    Dysrhythmia    afib   Eczema    Environmental allergies    allergy shot- q friday in Dr. Janee Morn office. PFT's abnormal- recommended Spiriva to use preop & will d/c after surgery   GERD (gastroesophageal reflux disease)    Hiatal hernia    Hyperlipidemia    Hypertension    Lung nodule 2011   Paroxysmal atrial fibrillation ([place]) 11/27/2019   Peripheral vascular disease (HCC)    Shingles    Stenosis of popliteal artery (HCC)    blood clots in legs long ago     Trigeminal neuralgia    Left buttocks   Past Surgical History:  Procedure Laterality Date   ABDOMINAL AORTAGRAM N/A 06/17/2011   Procedure: ABDOMINAL Maxcine Ham;   Surgeon: Elam Dutch, MD;  Location: [facility] CATH LAB;  Service: Cardiovascular;  Laterality: N/A;   ABDOMINAL AORTOGRAM W/LOWER EXTREMITY Bilateral 06/22/2018   Procedure: ABDOMINAL AORTOGRAM W/LOWER EXTREMITY;  Surgeon: Elam Dutch, MD;  Location: [facility] CV LAB;  Service: Cardiovascular;  Laterality: Bilateral;   ABDOMINAL AORTOGRAM W/LOWER EXTREMITY Bilateral 11/29/2019   Procedure: ABDOMINAL AORTOGRAM W/LOWER EXTREMITY;  Surgeon: Elam Dutch, MD;  Location: [facility] CV LAB;  Service: Cardiovascular;  Laterality: Bilateral;   CATARACT EXTRACTION, BILATERAL     w IOL   CHOLECYSTECTOMY  1998   COLONOSCOPY     ENDARTERECTOMY FEMORAL Left 12/09/2019   Procedure: ENDARTERECTOMY FEMORAL with bovine patch angioplasty.;  Surgeon: Elam Dutch, MD;  Location: [facility] OR;  Service: Vascular;  Laterality: Left;   FEMORAL-POPLITEAL BYPASS GRAFT        x2 surgeries 1990's & 2009   FEMORAL-POPLITEAL BYPASS GRAFT Right 12/09/2019   Procedure: REDO RIGHT FEMORAL-POPLITEAL ARTERY BYPASS GRAFT;  Surgeon: Elam Dutch, MD;  Location: [facility] OR;  Service: Vascular;  Laterality: Right;   INJECTION KNEE Right Aug. 2016   Gel injection for pain   LUMBAR FUSION  07/06/2011   TONSILLECTOMY     as a teenager    TUBAL LIGATION     Family History  Problem Relation Age of Onset   Arthritis Mother    Hypertension Mother    Heart disease Father    Colon polyps Father    Breast cancer Sister    Breast cancer Daughter    Hypertension Son    Lung cancer Brother    Colon cancer Sister    Brain cancer Sister    Lung cancer Sister    Anesthesia problems Neg Hx    Hypotension Neg Hx    Malignant hyperthermia Neg Hx    Pseudochol deficiency Neg Hx    Social History   Socioeconomic History   Marital status: Single    Spouse name: Not on file   Number of children: 4   Years of education: 12   Highest education level: Not on file  Occupational History   Occupation: retired    Fish farm manager:  RETIRED  Tobacco Use   Smoking status: Former    Packs/day: 2.00    Years: 30.00    Total pack years: 60.00    Types: Cigarettes    Quit date: 06/18/1993    Years since quitting: 28.6   Smokeless tobacco: Never   Tobacco comments:    QUIT IN 1995  Vaping Use   Vaping Use: Never used  Substance and Sexual Activity   Alcohol use: No    Alcohol/week: 0.0 standard drinks of alcohol   Drug use: No   Sexual activity:  Not on file  Other Topics Concern   Not on file  Social History Narrative   Patient is widowed with 4 children. 2 grandkids. Lives alone.    Patient is right handed.   Patient has high school education.   Patient drinks 1 cup daily.      Retired from [employer] for 28 years, works 2 days a week until 2016.       Hobbies: volunteers at [employer], [place] from church visits people.       No caffeine.    Social Determinants of Health   Financial Resource Strain: Low Risk  (02/03/2022)   Overall Financial Resource Strain (CARDIA)    Difficulty of Paying Living Expenses: Not hard at all  Food Insecurity: No Food Insecurity (02/03/2022)   Hunger Vital Sign    Worried About Running Out of Food in the Last Year: Never true    Ran Out of Food in the Last Year: Never true  Transportation Needs: No Transportation Needs (02/03/2022)   PRAPARE - Hydrologist (Medical): No    Lack of Transportation (Non-Medical): No  Physical Activity: Sufficiently Active (02/03/2022)   Exercise Vital Sign    Days of Exercise per Week: 7 days    Minutes of Exercise per Session: 30 min  Stress: No Stress Concern Present (02/03/2022)   [place]    Feeling of Stress : Not at all  Social Connections: Moderately Integrated (02/03/2022)   Social Connection and Isolation Panel [NHANES]    Frequency of Communication with Friends and Family: More than three times a week    Frequency  of Social Gatherings with Friends and Family: More than three times a week    Attends Religious Services: More than 4 times per year    Active Member of [employer] or Organizations: Yes    Attends Archivist Meetings: 1 to 4 times per year    Marital Status: Widowed    Tobacco Counseling Counseling given: Not Answered Tobacco comments: QUIT IN 1995   Clinical Intake:  Pre-visit preparation completed: Yes  Pain : No/denies pain     BMI - recorded: 33.93 Nutritional Risks: None Diabetes: No  How often do you need to have someone help you when you read instructions, pamphlets, or other written materials from your doctor or pharmacy?: 1 - Never  Diabetic?no  Interpreter Needed?: No  Information entered by :: Charlott Rakes, LPN   Activities of Daily Living    02/03/2022    1:08 PM  In your present state of health, do you have any difficulty performing the following activities:  Hearing? 0  Vision? 0  Difficulty concentrating or making decisions? 0  Walking or climbing stairs? 0  Dressing or bathing? 0  Doing errands, shopping? 0  Preparing Food and eating ? N  Using the Toilet? N  In the past six months, have you accidently leaked urine? N  Do you have problems with loss of bowel control? N  Managing your Medications? N  Managing your Finances? N  Housekeeping or managing your Housekeeping? N    Patient Care Team: Marin Olp, MD as PCP - General (Family Medicine) Skeet Latch, MD as PCP - Cardiology (Cardiology) Leandrew Koyanagi, MD as Attending Physician (Orthopedic Surgery) Jamse Arn, MD as Consulting Physician (Physical Medicine and Rehabilitation) Warden Fillers, MD as Consulting Physician (Ophthalmology) Pieter Partridge, DO as Consulting Physician (Neurology) Rosana Hoes,  Jerilynn Mages, [place] as Pharmacist (Pharmacist)  Indicate any recent Medical Services you may have received from other than [facility] providers in the past year (date may be  approximate).     Assessment:   This is a routine wellness examination for [place].  Hearing/Vision screen Hearing Screening - Comments:: Pt has hearing aids  Vision Screening - Comments:: Pt follows up with Dr Katy Fitch for annul eye exams   Dietary issues and exercise activities discussed: Current Exercise Habits: Home exercise routine, Type of exercise: walking, Time (Minutes): 30, Frequency (Times/Week): 7, Weekly Exercise (Minutes/Week): 210   Goals Addressed             This Visit's Progress    Patient Stated       Lose 10 to 15 lbs        Depression Screen    02/03/2022    1:03 PM 12/28/2021   11:03 AM 11/02/2021   10:17 AM 09/07/2021   11:16 AM 07/13/2021    1:06 PM 05/18/2021    1:50 PM 03/11/2021   10:36 AM  PHQ 2/9 Scores  PHQ - 2 Score 0 0 0 0 0 0 0    Fall Risk    02/03/2022    1:08 PM 12/28/2021   11:03 AM 11/02/2021   10:17 AM 09/07/2021   11:16 AM 07/13/2021    1:06 PM  [place] in the past year? 0 0 0 0 0  Number falls in past yr: 0   0 0  Injury with Fall? 0      Risk for fall due to : Impaired balance/gait;Impaired vision      Follow up Falls prevention discussed        FALL RISK PREVENTION PERTAINING TO THE HOME:  Any stairs in or around the home? No  If so, are there any without handrails? No  Home free of loose throw rugs in walkways, pet beds, electrical cords, etc? Yes  Adequate lighting in your home to reduce risk of falls? Yes   ASSISTIVE DEVICES UTILIZED TO PREVENT FALLS:  Life alert? Yes  Use of a cane, walker or w/c? Yes  Grab bars in the bathroom? Yes  Shower chair or bench in shower? Yes  Elevated toilet seat or a handicapped toilet? No   TIMED UP AND GO:  Was the test performed? Yes .  Length of time to ambulate 10 feet: 20 sec.   Gait steady and fast with assistive device  Cognitive Function:        02/03/2022    1:10 PM 01/21/2021   11:10 AM 01/16/2020   10:32 AM  6CIT Screen  What Year? 0 points 0 points  0 points  What month? 0 points 0 points 0 points  What time? 0 points 0 points   Count back from 20 0 points 0 points 0 points  Months in reverse 0 points 0 points 0 points  Repeat phrase 0 points 2 points 4 points  Total Score 0 points 2 points     Immunizations Immunization History  Administered Date(s) Administered   Fluad Quad(high Dose 65+) 01/14/2019, 02/12/2020, 01/21/2021   Influenza Split 12/25/2011   Influenza Whole 01/23/2010   Influenza,inj,Quad PF,6+ Mos 12/31/2012, 12/26/2014, 01/08/2016   Influenza-Unspecified 01/23/2014, 12/25/2017, 12/28/2021   PFIZER(Purple Top)SARS-COV-2 Vaccination 05/14/2019, 06/03/2019, 01/22/2020, 08/05/2020   PPD Test 06/24/2011   Pneumococcal Conjugate-13 08/31/2015   Pneumococcal Polysaccharide-23 04/25/2005   Td 04/25/2005   Tdap 01/14/2019   Zoster Recombinat (Shingrix)  04/20/2017, 06/29/2017   Zoster, Live 03/02/2010    TDAP status: Up to date  Flu Vaccine status: Up to date  Pneumococcal vaccine status: Up to date  Covid-19 vaccine status: Completed vaccines  Qualifies for Shingles Vaccine? Yes   Zostavax completed Yes   Shingrix Completed?: Yes  Screening Tests Health Maintenance  Topic Date Due   COVID-19 Vaccine (5 - Pfizer risk series) 09/30/2020   TETANUS/TDAP  01/13/2029   Pneumonia Vaccine 69+ Years old  Completed   INFLUENZA VACCINE  Completed   DEXA SCAN  Completed   Zoster Vaccines- Shingrix  Completed   HPV VACCINES  Aged Out    Health Maintenance  Health Maintenance Due  Topic Date Due   COVID-19 Vaccine (5 - Pfizer risk series) 09/30/2020    Colorectal cancer screening: No longer required.   Mammogram status: No longer required due to age .  Bone density 04/26/08    Additional Screening:   Vision Screening: Recommended annual ophthalmology exams for early detection of glaucoma and other disorders of the eye. Is the patient up to date with their annual eye exam?  Yes  Who is the provider or  what is the name of the office in which the patient attends annual eye exams? Dr Katy Fitch  If pt is not established with a provider, would they like to be referred to a provider to establish care? No .   Dental Screening: Recommended annual dental exams for proper oral hygiene  Community Resource Referral / Chronic Care Management: CRR required this visit?  No   CCM required this visit?  No      Plan:     I have personally reviewed and noted the following in the patient's chart:   Medical and social history Use of alcohol, tobacco or illicit drugs  Current medications and supplements including opioid prescriptions. Patient is not currently taking opioid prescriptions. Functional ability and status Nutritional status Physical activity Advanced directives List of other physicians Hospitalizations, surgeries, and ER visits in previous 12 months Vitals Screenings to include cognitive, depression, and falls Referrals and appointments  In addition, I have reviewed and discussed with patient certain preventive protocols, quality metrics, and best practice recommendations. A written personalized care plan for preventive services as well as general preventive health recommendations were provided to patient.     Willette Brace, LPN   30/12/2328   Nurse Notes: none

## 2022-02-03 NOTE — Patient Instructions (Addendum)
[name] [name] , Thank you for taking time to come for your Medicare Wellness Visit. I appreciate your ongoing commitment to your health goals. Please review the following plan we discussed and let me know if I can assist you in the future.   These are the goals we discussed:  Goals      Maintain current health status     Patient Stated     Be able to get off walker and drive again     Patient Stated     Stay healthy and able to care for herself     Track and Manage Fluids and Swelling-Heart Failure     Timeframe:  Long-Range Goal Priority:  High Start Date:  06/07/21                           Expected End Date:      12/05/21                 Follow Up Date 09/04/21    - call office if I gain more than 2 pounds in one day or 5 pounds in one week - watch for swelling in feet, ankles and legs every day    Why is this important?   It is important to check your weight daily and watch how much salt and liquids you have.  It will help you to manage your heart failure.    Notes: Continue to monitor weight!        This is a list of the screening recommended for you and due dates:  Health Maintenance  Topic Date Due   COVID-19 Vaccine (5 - Pfizer risk series) 09/30/2020   Flu Shot  11/23/2021   Tetanus Vaccine  01/13/2029   Pneumonia Vaccine  Completed   DEXA scan (bone density measurement)  Completed   Zoster (Shingles) Vaccine  Completed   HPV Vaccine  Aged Out    Advanced directives: copies in chart   Conditions/risks identified: lose 10 to 15 lbs   Next appointment: Follow up in one year for your annual wellness visit    Preventive Care 65 Years and Older, Female Preventive care refers to lifestyle choices and visits with your health care provider that can promote health and wellness. What does preventive care include? A yearly physical exam. This is also called an annual well check. Dental exams once or twice a year. Routine eye exams. Ask your health care provider how  often you should have your eyes checked. Personal lifestyle choices, including: Daily care of your teeth and gums. Regular physical activity. Eating a healthy diet. Avoiding tobacco and drug use. Limiting alcohol use. Practicing safe sex. Taking low-dose aspirin every day. Taking vitamin and mineral supplements as recommended by your health care provider. What happens during an annual well check? The services and screenings done by your health care provider during your annual well check will depend on your age, overall health, lifestyle risk factors, and family history of disease. Counseling  Your health care provider may ask you questions about your: Alcohol use. Tobacco use. Drug use. Emotional well-being. Home and relationship well-being. Sexual activity. Eating habits. History of falls. Memory and ability to understand (cognition). Work and work Statistician. Reproductive health. Screening  You may have the following tests or measurements: Height, weight, and BMI. Blood pressure. Lipid and cholesterol levels. These may be checked every 5 years, or more frequently if you are over 71 years old. Skin  check. Lung cancer screening. You may have this screening every year starting at age 58 if you have a 30-pack-year history of smoking and currently smoke or have quit within the past 15 years. Fecal occult blood test (FOBT) of the stool. You may have this test every year starting at age 17. Flexible sigmoidoscopy or colonoscopy. You may have a sigmoidoscopy every 5 years or a colonoscopy every 10 years starting at age 43. Hepatitis C blood test. Hepatitis B blood test. Sexually transmitted disease (STD) testing. Diabetes screening. This is done by checking your blood sugar (glucose) after you have not eaten for a while (fasting). You may have this done every 1-3 years. Bone density scan. This is done to screen for osteoporosis. You may have this done starting at age 80. Mammogram.  This may be done every 1-2 years. Talk to your health care provider about how often you should have regular mammograms. Talk with your health care provider about your test results, treatment options, and if necessary, the need for more tests. Vaccines  Your health care provider may recommend certain vaccines, such as: Influenza vaccine. This is recommended every year. Tetanus, diphtheria, and acellular pertussis (Tdap, Td) vaccine. You may need a Td booster every 10 years. Zoster vaccine. You may need this after age 28. Pneumococcal 13-valent conjugate (PCV13) vaccine. One dose is recommended after age 19. Pneumococcal polysaccharide (PPSV23) vaccine. One dose is recommended after age 79. Talk to your health care provider about which screenings and vaccines you need and how often you need them. This information is not intended to replace advice given to you by your health care provider. Make sure you discuss any questions you have with your health care provider. Document Released: 05/08/2015 Document Revised: 12/30/2015 Document Reviewed: 02/10/2015 Elsevier Interactive Patient Education  2017 [place] Prevention in the Home Falls can cause injuries. They can happen to people of all ages. There are many things you can do to make your home safe and to help prevent falls. What can I do on the outside of my home? Regularly fix the edges of walkways and driveways and fix any cracks. Remove anything that might make you trip as you walk through a door, such as a raised step or threshold. Trim any bushes or trees on the path to your home. Use bright outdoor lighting. Clear any walking paths of anything that might make someone trip, such as rocks or tools. Regularly check to see if handrails are loose or broken. Make sure that both sides of any steps have handrails. Any raised decks and porches should have guardrails on the edges. Have any leaves, snow, or ice cleared regularly. Use sand  or salt on walking paths during winter. Clean up any spills in your garage right away. This includes oil or grease spills. What can I do in the bathroom? Use night lights. Install grab bars by the toilet and in the tub and shower. Do not use towel bars as grab bars. Use non-skid mats or decals in the tub or shower. If you need to sit down in the shower, use a plastic, non-slip stool. Keep the floor dry. Clean up any water that spills on the floor as soon as it happens. Remove soap buildup in the tub or shower regularly. Attach bath mats securely with double-sided non-slip rug tape. Do not have throw rugs and other things on the floor that can make you trip. What can I do in the bedroom? Use night lights. Make  sure that you have a light by your bed that is easy to reach. Do not use any sheets or blankets that are too big for your bed. They should not hang down onto the floor. Have a firm chair that has side arms. You can use this for support while you get dressed. Do not have throw rugs and other things on the floor that can make you trip. What can I do in the kitchen? Clean up any spills right away. Avoid walking on wet floors. Keep items that you use a lot in easy-to-reach places. If you need to reach something above you, use a strong step stool that has a grab bar. Keep electrical cords out of the way. Do not use floor polish or wax that makes floors slippery. If you must use wax, use non-skid floor wax. Do not have throw rugs and other things on the floor that can make you trip. What can I do with my stairs? Do not leave any items on the stairs. Make sure that there are handrails on both sides of the stairs and use them. Fix handrails that are broken or loose. Make sure that handrails are as long as the stairways. Check any carpeting to make sure that it is firmly attached to the stairs. Fix any carpet that is loose or worn. Avoid having throw rugs at the top or bottom of the stairs.  If you do have throw rugs, attach them to the floor with carpet tape. Make sure that you have a light switch at the top of the stairs and the bottom of the stairs. If you do not have them, ask someone to add them for you. What else can I do to help prevent falls? Wear shoes that: Do not have high heels. Have rubber bottoms. Are comfortable and fit you well. Are closed at the toe. Do not wear sandals. If you use a stepladder: Make sure that it is fully opened. Do not climb a closed stepladder. Make sure that both sides of the stepladder are locked into place. Ask someone to hold it for you, if possible. Clearly mark and make sure that you can see: Any grab bars or handrails. First and last steps. Where the edge of each step is. Use tools that help you move around (mobility aids) if they are needed. These include: Canes. Walkers. Scooters. Crutches. Turn on the lights when you go into a dark area. Replace any light bulbs as soon as they burn out. Set up your furniture so you have a clear path. Avoid moving your furniture around. If any of your floors are uneven, fix them. If there are any pets around you, be aware of where they are. Review your medicines with your doctor. Some medicines can make you feel dizzy. This can increase your chance of falling. Ask your doctor what other things that you can do to help prevent falls. This information is not intended to replace advice given to you by your health care provider. Make sure you discuss any questions you have with your health care provider. Document Released: 02/05/2009 Document Revised: 09/17/2015 Document Reviewed: 05/16/2014 Elsevier Interactive Patient Education  2017 [employer].

## 2022-02-03 NOTE — Therapy (Signed)
OUTPATIENT PHYSICAL THERAPY TREATMENT   Patient Name: [name] [name] MRN: [id number] DOB:26-Apr-1924, 88 y.o., female Today's Date: 02/03/2022  PCP:  Marin Olp, MD REFERRING PROVIDER: Izora Ribas, MD      END OF SESSION:   PT End of Session - 02/03/22 1529     Visit Number 9    Number of Visits 16    Date for PT Re-Evaluation 02/28/22    Authorization Type Medicare A/B; BCBS Supplemental 2023    Progress Note Due on Visit 18    PT Start Time 7681    PT Stop Time 1615    PT Time Calculation (min) 45 min    Equipment Utilized During Treatment Gait belt    Activity Tolerance Patient tolerated treatment well    Behavior During Therapy WFL for tasks assessed/performed                 Past Medical History:  Diagnosis Date   Acute on chronic diastolic heart failure ([place]) 12/30/2020   Allergic rhinoconjunctivitis    Allergy    Anxiety    pt. managed- uses deep breathing    Arthritis    low back , stenosis   COLONIC POLYPS, RECURRENT 08/29/2006   2008 last colonoscopy. No further colonoscopy.      COPD (chronic obstructive pulmonary disease) (HCC)    Diverticulosis    Dysrhythmia    afib   Eczema    Environmental allergies    allergy shot- q friday in Dr. Janee Morn office. PFT's abnormal- recommended Spiriva to use preop & will d/c after surgery   GERD (gastroesophageal reflux disease)    Hiatal hernia    Hyperlipidemia    Hypertension    Lung nodule 2011   Paroxysmal atrial fibrillation ([place]) 11/27/2019   Peripheral vascular disease (HCC)    Shingles    Stenosis of popliteal artery (HCC)    blood clots in legs long ago     Trigeminal neuralgia    Left buttocks   Past Surgical History:  Procedure Laterality Date   ABDOMINAL AORTAGRAM N/A 06/17/2011   Procedure: ABDOMINAL Maxcine Ham;  Surgeon: Elam Dutch, MD;  Location: [facility] CATH LAB;  Service: Cardiovascular;  Laterality: N/A;   ABDOMINAL AORTOGRAM W/LOWER EXTREMITY Bilateral 06/22/2018    Procedure: ABDOMINAL AORTOGRAM W/LOWER EXTREMITY;  Surgeon: Elam Dutch, MD;  Location: [place] CV LAB;  Service: Cardiovascular;  Laterality: Bilateral;   ABDOMINAL AORTOGRAM W/LOWER EXTREMITY Bilateral 11/29/2019   Procedure: ABDOMINAL AORTOGRAM W/LOWER EXTREMITY;  Surgeon: Elam Dutch, MD;  Location: [place] CV LAB;  Service: Cardiovascular;  Laterality: Bilateral;   CATARACT EXTRACTION, BILATERAL     w IOL   CHOLECYSTECTOMY  1998   COLONOSCOPY     ENDARTERECTOMY FEMORAL Left 12/09/2019   Procedure: ENDARTERECTOMY FEMORAL with bovine patch angioplasty.;  Surgeon: Elam Dutch, MD;  Location: [place];  Service: Vascular;  Laterality: Left;   FEMORAL-POPLITEAL BYPASS GRAFT        x2 surgeries 1990's & 2009   FEMORAL-POPLITEAL BYPASS GRAFT Right 12/09/2019   Procedure: REDO RIGHT FEMORAL-POPLITEAL ARTERY BYPASS GRAFT;  Surgeon: Elam Dutch, MD;  Location: [place];  Service: Vascular;  Laterality: Right;   INJECTION KNEE Right Aug. 2016   Gel injection for pain   LUMBAR FUSION  07/06/2011   TONSILLECTOMY     as a teenager    TUBAL LIGATION     Patient Active Problem List   Diagnosis Date Noted   Mild sleep apnea  05/03/2021   Chronic diastolic heart failure ([place]) 12/30/2020   Primary osteoarthritis of right knee 06/02/2020   Left groin hernia 05/22/2020   Sleep disturbance 01/09/2020   Acute lower UTI    Anxiety state    Peripheral edema    Leukocytosis    Debility 12/13/2019   Dyslipidemia    Rectal bleeding    Acute blood loss anemia    Ischemic colitis ([place])    Status post femoral-popliteal bypass surgery 12/09/2019   Persistent atrial fibrillation ([place]) 12/03/2019   Secondary hypercoagulable state ([place]) 12/03/2019   Chronic bilateral low back pain with right-sided sciatica 08/29/2019   Chronic pain of right knee 08/29/2019   Abnormality of gait 05/30/2019   Neuropathic pain 05/30/2019   Myalgia 08/08/2018   Chronic pain syndrome 06/27/2018   Gross  hematuria 06/01/2018   Obesity (BMI 30.0-34.9) 11/14/2017   Failed back syndrome 09/28/2017   CKD (chronic kidney disease), stage III ([place]) 08/07/2017   Unilateral primary osteoarthritis, right knee 04/07/2016   Acute gout of left foot 03/22/2016   Pain in right ankle and joints of right foot 08/28/2014   Trigeminal neuralgia    Encounter for therapeutic drug monitoring 10/21/2013   Syncope 12/29/2011   Chronic low back pain 09/13/2010   Allergic rhinitis due to pollen 06/25/2010   COPD mixed type ([place]) 01/22/2010   Solitary pulmonary nodule 01/12/2010   Insulin resistance 01/06/2009   Osteopenia 01/06/2009   Eczema 11/26/2007   Peripheral arterial disease ([place]) 06/29/2007   Hyperlipidemia 10/23/2006   Essential hypertension 10/23/2006   ESOPHAGEAL STRICTURE 08/09/1993    REFERRING DIAG: M79.10 (ICD-10-CM) - Myalgia   THERAPY DIAG:  Chronic bilateral low back pain without sciatica  Muscle weakness (generalized)  Unsteadiness on feet  Other abnormalities of gait and mobility  Rationale for Evaluation and Treatment Rehabilitation  PERTINENT HISTORY:  hx of lumbar fusion  PRECAUTIONS: None  SUBJECTIVE: back is feeling ok, difficulty with getting up/down from floor at last session  PAIN:  Are you having pain? Yes: NPRS scale: 1-2/10 Pain location: back pain Pain description: constant Aggravating factors: damp weather  Relieving factors: movement alleviates   OBJECTIVE:   TODAY'S TREATMENT: 02/03/22 Activity Comments  Sidestepping x 2 min   Retrowalking x 1.75 min Onset of fatigue and radiating LE discomfort  Standing on foam EO 3x30 sec EC 3x10 sec--poor stability Semi-tandem-2x30 sec  Stair lift-off 1x10 BLE, 4" step   Gastroc stretch X 2 min slantboard         TODAY'S TREATMENT: 01/31/22 Activity Comments  Nustep L3 x 6 min Ues/Les  Maintaining 80 SPM  Berg 44/56   Floor transfers Min A to floor, heavy verbal cueing and tactile cueing as well as mod  A to stand using mat         PATIENT EDUCATION: Education details: edu on POC, discussion on progress towards goals  Person educated: Patient Education method: Explanation Education comprehension: verbalized understanding     HOME EXERCISE PROGRAM Last updated: 01/13/22 Access Code: [email or web address] URL: [email or web address] Date: 01/13/2022 Prepared by: [facility]  Exercises - Supine Posterior Pelvic Tilt  - 1 x daily - 7 x weekly - 1-3 sets - 10 reps - 3 sec hold - Hooklying Single Knee to Chest Stretch  - 1 x daily - 7 x weekly - 1 sets - 3 reps - 15-30 sec hold - Supine Lower Trunk Rotation  - 1 x daily - 7 x weekly -  3 sets - 10 reps - 15-30 sec hold - Sidelying Thoracic Lumbar Rotation  - 1 x daily - 7 x weekly - 1 sets - 3 reps - 15-30 sec hold - Staggered Stance Forward Backward Weight Shift with Counter Support  - 1-2 x daily - 7 x weekly - 1-2 sets - 10 reps - Hooklying Single Leg Bent Knee Fallouts with Resistance  - 1 x daily - 5 x weekly - 2 sets - 10 reps - Supine Bridge  - 1 x daily - 5 x weekly - 2 sets - 10 reps     ------------------------------------------------------------------------------- (objective measures completed at initial evaluation unless otherwise dated)   DIAGNOSTIC FINDINGS:  IMPRESSION: 1. L4-5 PLIF with resection of previously seen disc material. No residual spinal canal stenosis. 2. Unchanged moderate bilateral neural foraminal stenosis at L2-3 and L3-4. 3. Progression of severe right T11-12 and T12-L1 neural foraminal stenosis.   COGNITION: Overall cognitive status: Within functional limits for tasks assessed             SENSATION: WFL, denies parasthesias   COORDINATION: WNL   EDEMA:      MUSCLE TONE: WNL   Palpation:  lower thoracic/high lumbar paraspinals very tight and tender to palpation, decrease in lumbar lordosis notable     MUSCLE LENGTH: Left hamstrings tight  and restricted vs right. Left SLR 35 degrees vs RLE SLR of 50-60 deg   DTRs:  DNT   POSTURE: No Significant postural limitations Side-sleeper, typically on the left      LOWER EXTREMITY ROM:    WFL   Lumbar ROM: flexion WFL                         Extension grossly limited                         Sidebending symmetrical 10% limited   LOWER EXTREMITY MMT:     MMT Right Eval Left Eval  Hip flexion 4 4  Hip extension      Hip abduction 4 4  Hip adduction      Hip internal rotation      Hip external rotation      Knee flexion 5 5  Knee extension 5 5  Ankle dorsiflexion 5 5  Ankle plantarflexion      Ankle inversion      Ankle eversion      (Blank rows = not tested)   Trunk flexion: 2+/5     BED MOBILITY:  Modified independent, increased time   TRANSFERS: Assistive device utilized: None  Sit to stand: Complete Independence Stand to sit: Complete Independence Chair to chair: Complete Independence Floor:  DNT       CURB:  Level of Assistance: Modified independence Assistive device utilized: Single point cane Curb Comments:        GAIT: Gait pattern: WFL Distance walked: pt report she is able to walk grocery store distances using shopping cart for support Assistive device utilized: Single point cane and None Level of assistance: Complete Independence and Modified independence Comments: reports use of cane for community distances, no AD for household use   FUNCTIONAL TESTs:  6 minute walk test: NT Berg Balance Scale: NT   PATIENT SURVEYS:      TODAY'S TREATMENT:  Evaluation, education, HEP initiation     PATIENT EDUCATION: Education details: use of anti-fatigue mats, support pillow for sleeping (pregnancy pillow), assessment findings Person  educated: Patient Education method: Theatre stage manager Education comprehension: needs further education     HOME EXERCISE PROGRAM: Access Code: [email or web address] URL:  [email or web address] Date: 01/05/2022 Prepared by: Sherlyn Lees   Exercises - Supine Posterior Pelvic Tilt  - 1 x daily - 7 x weekly - 1-3 sets - 10 reps - 3 sec hold       GOALS: Goals reviewed with patient? Yes   SHORT TERM GOALS: Target date: 01/19/2022   Patient will be independent in HEP to improve functional outcomes with emphasis on core strength and hamstring flexibility Baseline: Goal status: MET for current HEP 01/31/22       LONG TERM GOALS: Target date: 02/28/2022   Pt to report successful positions/strategies for sleeping posture to improve quality of sleep and reduce back pain Baseline: Reports occasional LBP at night- reports that laying on either side is the most comfortable.  Goal status: MET 01/31/22   2.  Demo trunk flexion strength 3-/5 for improve stability Baseline: 2+/5; NT 01/31/22 Goal status: IN PROGRESS   3.  Report improved standing tolerance with use of anti-fatigue mat or other adaptations x 20 min to improve efficiency of housekeeping tasks Baseline: 5-10 min; reports 2-3 minutes 01/31/22 Goal status: IN PROGRESS 01/31/22   4.  Pt to report/demonstrate 3 strategies for self-management of chronic low back pain to improve comfort and QOL.  Baseline: main strategy at present is seated rest period;  Goal status: MET 01/31/22  5.  Pt to demonstrate safe floor transfer with mod I.   Baseline: required min A to get down to floor, mod A and heavy verbal-tactile cueing to stand up 01/31/22 Goal status: IN PROGRESS 01/31/22  6.  Pt to score at least 46/56 on Berg to indicate decreased risk of falls.    Baseline: 44/56 01/31/22 Goal status: IN PROGRESS 01/31/22     ASSESSMENT:   CLINICAL IMPRESSION: Tolerated new additions of balance-focused activities in addition to strengthening for lumbar. Difficulty with RLE single limb support due to right knee pain/OA/instability. Profound LOB when standing with eyes closed conditions.  Continued sessions to progress POC details     OBJECTIVE IMPAIRMENTS decreased activity tolerance, decreased endurance, difficulty walking, decreased strength, impaired perceived functional ability, increased muscle spasms, impaired flexibility, improper body mechanics, postural dysfunction, and pain.    ACTIVITY LIMITATIONS carrying, standing, squatting, sleeping, bed mobility, and locomotion level   PARTICIPATION LIMITATIONS: meal prep, cleaning, laundry, and community activity   PERSONAL FACTORS Age, Time since onset of injury/illness/exacerbation, and 1 comorbidity: hx of lumbar fusion  are also affecting patient's functional outcome.    REHAB POTENTIAL: Good   CLINICAL DECISION MAKING: Stable/uncomplicated   EVALUATION COMPLEXITY: Low   PLAN: PT FREQUENCY: 12x/week   PT DURATION: 4 weeks   PLANNED INTERVENTIONS: Therapeutic exercises, Therapeutic activity, Neuromuscular re-education, Balance training, Gait training, Patient/Family education, Self Care, Joint mobilization, Stair training, Vestibular training, Canalith repositioning, Orthotic/Fit training, DME instructions, Aquatic Therapy, Electrical stimulation, Spinal mobilization, Cryotherapy, Moist heat, Taping, Traction, Ionotophoresis 36m/ml Dexamethasone, and Manual therapy   PLAN FOR NEXT SESSION: practice balance activities with EC and SLS/stepping activities and update into HEP as appropriate; work on components of floor transfers     4:22 PM, 02/03/22 M. KSherlyn Lees PT, DPT Physical Therapist- CArvadaOffice Number: [phone]  CMorristownat [facility] SMariano ColonGFairfield [place] [address]Phone # [phone] # [phone]

## 2022-02-08 ENCOUNTER — Encounter: Payer: Self-pay | Admitting: Orthopaedic Surgery

## 2022-02-08 ENCOUNTER — Ambulatory Visit (INDEPENDENT_AMBULATORY_CARE_PROVIDER_SITE_OTHER): Payer: Medicare Other | Admitting: Orthopaedic Surgery

## 2022-02-08 DIAGNOSIS — M1711 Unilateral primary osteoarthritis, right knee: Secondary | ICD-10-CM

## 2022-02-08 NOTE — Progress Notes (Unsigned)
Office Visit Note   Patient: [name] [name]           Date of Birth: Feb 12, 1925           MRN: [id number] Visit Date: 02/08/2022              Requested by: Marin Olp, MD [place],  [place] [id number] PCP: Marin Olp, MD   Assessment & Plan: Visit Diagnoses:  1. Unilateral primary osteoarthritis, right knee     Plan: Impression is right knee osteoarthritis.  Today, proceed with right knee Synvisc 1 injection.  She tolerated this well.  She will follow-up as needed. Synvisc 1 injection: Lot number [id number]; expiration date 07/12/2024  Follow-Up Instructions: Return if symptoms worsen or fail to improve.   Orders:  No orders of the defined types were placed in this encounter.  No orders of the defined types were placed in this encounter.     Procedures: No procedures performed   Clinical Data: No additional findings.   Subjective: Chief Complaint  Patient presents with   Right Knee - Follow-up    Synvisc One    HPI patient is a pleasant 88 year old female with underlying right knee osteoarthritis who comes in today for Synvisc 1 injection.  She has had these in the past which have provided approximately 6 months relief.     Objective: Vital Signs: There were no vitals taken for this visit.    Ortho Exam stable right knee exam  Specialty Comments:  No specialty comments available.  Imaging: No new imaging   PMFS History: Patient Active Problem List   Diagnosis Date Noted   Mild sleep apnea 05/03/2021   Chronic diastolic heart failure ([place]) 12/30/2020   Primary osteoarthritis of right knee 06/02/2020   Left groin hernia 05/22/2020   Sleep disturbance 01/09/2020   Acute lower UTI    Anxiety state    Peripheral edema    Leukocytosis    Debility 12/13/2019   Dyslipidemia    Rectal bleeding    Acute blood loss anemia    Ischemic colitis ([place])    Status post femoral-popliteal bypass surgery 12/09/2019    Persistent atrial fibrillation ([place]) 12/03/2019   Secondary hypercoagulable state ([place]) 12/03/2019   Chronic bilateral low back pain with right-sided sciatica 08/29/2019   Chronic pain of right knee 08/29/2019   Abnormality of gait 05/30/2019   Neuropathic pain 05/30/2019   Myalgia 08/08/2018   Chronic pain syndrome 06/27/2018   Gross hematuria 06/01/2018   Obesity (BMI 30.0-34.9) 11/14/2017   Failed back syndrome 09/28/2017   CKD (chronic kidney disease), stage III ([place]) 08/07/2017   Unilateral primary osteoarthritis, right knee 04/07/2016   Acute gout of left foot 03/22/2016   Pain in right ankle and joints of right foot 08/28/2014   Trigeminal neuralgia    Encounter for therapeutic drug monitoring 10/21/2013   Syncope 12/29/2011   Chronic low back pain 09/13/2010   Allergic rhinitis due to pollen 06/25/2010   COPD mixed type ([place]) 01/22/2010   Solitary pulmonary nodule 01/12/2010   Insulin resistance 01/06/2009   Osteopenia 01/06/2009   Eczema 11/26/2007   Peripheral arterial disease ([place]) 06/29/2007   Hyperlipidemia 10/23/2006   Essential hypertension 10/23/2006   ESOPHAGEAL STRICTURE 08/09/1993   Past Medical History:  Diagnosis Date   Acute on chronic diastolic heart failure ([place]) 12/30/2020   Allergic rhinoconjunctivitis    Allergy    Anxiety    pt. managed- uses  deep breathing    Arthritis    low back , stenosis   COLONIC POLYPS, RECURRENT 08/29/2006   2008 last colonoscopy. No further colonoscopy.      COPD (chronic obstructive pulmonary disease) (HCC)    Diverticulosis    Dysrhythmia    afib   Eczema    Environmental allergies    allergy shot- q friday in Dr. Janee Morn office. PFT's abnormal- recommended Spiriva to use preop & will d/c after surgery   GERD (gastroesophageal reflux disease)    Hiatal hernia    Hyperlipidemia    Hypertension    Lung nodule 2011   Paroxysmal atrial fibrillation ([place]) 11/27/2019   Peripheral vascular disease ([place])    Shingles     Stenosis of popliteal artery (HCC)    blood clots in legs long ago     Trigeminal neuralgia    Left buttocks    Family History  Problem Relation Age of Onset   Arthritis Mother    Hypertension Mother    Heart disease Father    Colon polyps Father    Breast cancer Sister    Breast cancer Daughter    Hypertension Son    Lung cancer Brother    Colon cancer Sister    Brain cancer Sister    Lung cancer Sister    Anesthesia problems Neg Hx    Hypotension Neg Hx    Malignant hyperthermia Neg Hx    Pseudochol deficiency Neg Hx     Past Surgical History:  Procedure Laterality Date   ABDOMINAL AORTAGRAM N/A 06/17/2011   Procedure: ABDOMINAL Maxcine Ham;  Surgeon: Elam Dutch, MD;  Location[facility] CATH LAB;  Service: Cardiovascular;  Laterality: N/A;   ABDOMINAL AORTOGRAM W/LOWER EXTREMITY Bilateral 06/22/2018   Procedure: ABDOMINAL AORTOGRAM W/LOWER EXTREMITY;  Surgeon: Elam Dutch, MD;  Location: [place] [facility] LAB;  Service: Cardiovascular;  Laterality: Bilateral;   ABDOMINAL AORTOGRAM W/LOWER EXTREMITY Bilateral 11/29/2019   Procedure: ABDOMINAL AORTOGRAM W/LOWER EXTREMITY;  Surgeon: Elam Dutch, MD;  Location: [place] [facility] LAB;  Service: Cardiovascular;  Laterality: Bilateral;   CATARACT EXTRACTION, BILATERAL     w IOL   CHOLECYSTECTOMY  1998   COLONOSCOPY     ENDARTERECTOMY FEMORAL Left 12/09/2019   Procedure: ENDARTERECTOMY FEMORAL with bovine patch angioplasty.;  Surgeon: Elam Dutch, MD;  Location: [place];  Service: Vascular;  Laterality: Left;   FEMORAL-POPLITEAL BYPASS GRAFT        x2 surgeries 1990's & 2009   FEMORAL-POPLITEAL BYPASS GRAFT Right 12/09/2019   Procedure: REDO RIGHT FEMORAL-POPLITEAL ARTERY BYPASS GRAFT;  Surgeon: Elam Dutch, MD;  Location: [place];  Service: Vascular;  Laterality: Right;   INJECTION KNEE Right Aug. 2016   Gel injection for pain   LUMBAR FUSION  07/06/2011   TONSILLECTOMY     as a teenager    TUBAL LIGATION     Social  History   Occupational History   Occupation: retired    Fish farm manager: RETIRED  Tobacco Use   Smoking status: Former    Packs/day: 2.00    Years: 30.00    Total pack years: 60.00    Types: Cigarettes    Quit date: 06/18/1993    Years since quitting: 28.6   Smokeless tobacco: Never   Tobacco comments:    Maria Antonia  Vaping Use   Vaping Use: Never used  Substance and Sexual Activity   Alcohol use: No    Alcohol/week: 0.0 standard drinks of alcohol  Drug use: No   Sexual activity: Not on file

## 2022-02-09 ENCOUNTER — Ambulatory Visit: Payer: Medicare Other | Admitting: Physical Therapy

## 2022-02-09 DIAGNOSIS — M1711 Unilateral primary osteoarthritis, right knee: Secondary | ICD-10-CM | POA: Diagnosis not present

## 2022-02-09 MED ORDER — HYLAN G-F 20 48 MG/6ML IX SOSY
48.0000 mg | PREFILLED_SYRINGE | INTRA_ARTICULAR | Status: AC | PRN
  Administered 2022-02-09: 48 mg via INTRA_ARTICULAR

## 2022-02-14 ENCOUNTER — Encounter (HOSPITAL_BASED_OUTPATIENT_CLINIC_OR_DEPARTMENT_OTHER): Payer: Self-pay | Admitting: Cardiovascular Disease

## 2022-02-14 ENCOUNTER — Ambulatory Visit (INDEPENDENT_AMBULATORY_CARE_PROVIDER_SITE_OTHER): Payer: Medicare Other | Admitting: Cardiovascular Disease

## 2022-02-14 VITALS — BP 114/60 | HR 90 | Ht 59.0 in | Wt 166.0 lb

## 2022-02-14 DIAGNOSIS — I739 Peripheral vascular disease, unspecified: Secondary | ICD-10-CM

## 2022-02-14 DIAGNOSIS — E7800 Pure hypercholesterolemia, unspecified: Secondary | ICD-10-CM | POA: Diagnosis not present

## 2022-02-14 DIAGNOSIS — I4891 Unspecified atrial fibrillation: Secondary | ICD-10-CM

## 2022-02-14 DIAGNOSIS — I5032 Chronic diastolic (congestive) heart failure: Secondary | ICD-10-CM

## 2022-02-14 DIAGNOSIS — I10 Essential (primary) hypertension: Secondary | ICD-10-CM

## 2022-02-14 NOTE — Assessment & Plan Note (Signed)
She remains in atrial fibrillation.  She is asymptomatic.  Rates are well-controlled.  Continue metoprolol and Eliquis.

## 2022-02-14 NOTE — Assessment & Plan Note (Signed)
She has been experiencing more PD symptoms lately.  She goes to establish care with Dr. Carlis Abbott next week.  Continue atorvastatin and Eliquis.  He is due for fasting lipids.  Check lipids and a CMP.  LDL goal is less than 70.

## 2022-02-14 NOTE — Therapy (Signed)
OUTPATIENT PHYSICAL THERAPY TREATMENT   Patient Name: [name] [name] MRN: [id number] DOB:1924-12-13, 88 y.o., female Today's Date: 02/15/2022  PCP:  Marin Olp, MD REFERRING PROVIDER: Izora Ribas, MD      END OF SESSION:   PT End of Session - 02/15/22 1100     Visit Number 10    Number of Visits 16    Date for PT Re-Evaluation 02/28/22    Authorization Type Medicare A/B; BCBS Supplemental 2023    Progress Note Due on Visit 18    PT Start Time 1019    PT Stop Time 1059    PT Time Calculation (min) 40 min    Equipment Utilized During Treatment Gait belt    Activity Tolerance Patient tolerated treatment well;Patient limited by pain    Behavior During Therapy WFL for tasks assessed/performed                  Past Medical History:  Diagnosis Date   Acute on chronic diastolic heart failure ([place]) 12/30/2020   Allergic rhinoconjunctivitis    Allergy    Anxiety    pt. managed- uses deep breathing    Arthritis    low back , stenosis   COLONIC POLYPS, RECURRENT 08/29/2006   2008 last colonoscopy. No further colonoscopy.      COPD (chronic obstructive pulmonary disease) (HCC)    Diverticulosis    Dysrhythmia    afib   Eczema    Environmental allergies    allergy shot- q friday in Dr. Janee Morn office. PFT's abnormal- recommended Spiriva to use preop & will d/c after surgery   GERD (gastroesophageal reflux disease)    Hiatal hernia    Hyperlipidemia    Hypertension    Lung nodule 2011   Paroxysmal atrial fibrillation ([place]) 11/27/2019   Peripheral vascular disease (HCC)    Shingles    Stenosis of popliteal artery (HCC)    blood clots in legs long ago     Trigeminal neuralgia    Left buttocks   Past Surgical History:  Procedure Laterality Date   ABDOMINAL AORTAGRAM N/A 06/17/2011   Procedure: ABDOMINAL Maxcine Ham;  Surgeon: Elam Dutch, MD;  Location: [facility] CATH LAB;  Service: Cardiovascular;  Laterality: N/A;   ABDOMINAL AORTOGRAM W/LOWER EXTREMITY  Bilateral 06/22/2018   Procedure: ABDOMINAL AORTOGRAM W/LOWER EXTREMITY;  Surgeon: Elam Dutch, MD;  Location: [place] CV LAB;  Service: Cardiovascular;  Laterality: Bilateral;   ABDOMINAL AORTOGRAM W/LOWER EXTREMITY Bilateral 11/29/2019   Procedure: ABDOMINAL AORTOGRAM W/LOWER EXTREMITY;  Surgeon: Elam Dutch, MD;  Location: [place] CV LAB;  Service: Cardiovascular;  Laterality: Bilateral;   CATARACT EXTRACTION, BILATERAL     w IOL   CHOLECYSTECTOMY  1998   COLONOSCOPY     ENDARTERECTOMY FEMORAL Left 12/09/2019   Procedure: ENDARTERECTOMY FEMORAL with bovine patch angioplasty.;  Surgeon: Elam Dutch, MD;  Location: [place];  Service: Vascular;  Laterality: Left;   FEMORAL-POPLITEAL BYPASS GRAFT        x2 surgeries 1990's & 2009   FEMORAL-POPLITEAL BYPASS GRAFT Right 12/09/2019   Procedure: REDO RIGHT FEMORAL-POPLITEAL ARTERY BYPASS GRAFT;  Surgeon: Elam Dutch, MD;  Location: [place];  Service: Vascular;  Laterality: Right;   INJECTION KNEE Right Aug. 2016   Gel injection for pain   LUMBAR FUSION  07/06/2011   TONSILLECTOMY     as a teenager    TUBAL LIGATION     Patient Active Problem List   Diagnosis Date Noted  Mild sleep apnea 05/03/2021   Chronic diastolic heart failure ([place]) 12/30/2020   Primary osteoarthritis of right knee 06/02/2020   Left groin hernia 05/22/2020   Sleep disturbance 01/09/2020   Acute lower UTI    Anxiety state    Peripheral edema    Leukocytosis    Debility 12/13/2019   Dyslipidemia    Rectal bleeding    Acute blood loss anemia    Ischemic colitis ([place])    Status post femoral-popliteal bypass surgery 12/09/2019   Persistent atrial fibrillation ([place]) 12/03/2019   Secondary hypercoagulable state ([place]) 12/03/2019   Chronic bilateral low back pain with right-sided sciatica 08/29/2019   Chronic pain of right knee 08/29/2019   Abnormality of gait 05/30/2019   Neuropathic pain 05/30/2019   Myalgia 08/08/2018   Chronic pain  syndrome 06/27/2018   Gross hematuria 06/01/2018   Obesity (BMI 30.0-34.9) 11/14/2017   Failed back syndrome 09/28/2017   CKD (chronic kidney disease), stage III ([place]) 08/07/2017   Unilateral primary osteoarthritis, right knee 04/07/2016   Acute gout of left foot 03/22/2016   Pain in right ankle and joints of right foot 08/28/2014   Trigeminal neuralgia    Encounter for therapeutic drug monitoring 10/21/2013   Syncope 12/29/2011   Chronic low back pain 09/13/2010   Allergic rhinitis due to pollen 06/25/2010   COPD mixed type ([place]) 01/22/2010   Solitary pulmonary nodule 01/12/2010   Insulin resistance 01/06/2009   Osteopenia 01/06/2009   Eczema 11/26/2007   Peripheral arterial disease ([place]) 06/29/2007   Hyperlipidemia 10/23/2006   Essential hypertension 10/23/2006   ESOPHAGEAL STRICTURE 08/09/1993    REFERRING DIAG: M79.10 (ICD-10-CM) - Myalgia   THERAPY DIAG:  Chronic bilateral low back pain without sciatica  Muscle weakness (generalized)  Unsteadiness on feet  Other abnormalities of gait and mobility  Cramp and spasm  Rationale for Evaluation and Treatment Rehabilitation  PERTINENT HISTORY:  hx of lumbar fusion  PRECAUTIONS: None  SUBJECTIVE: Apologizes for missing last session- had a R knee gel injection and the knee has been bothering her since.   PAIN:  Are you having pain? Yes: NPRS scale: 4-5/10 Pain location: R knee Pain description: sore Aggravating factors: knee bend  Relieving factors: time   OBJECTIVE:      TODAY'S TREATMENT: 02/15/22 Activity Comments  palpation R lateral knee, proximal lateral and medial gastroc TTP; no redness, warmth  Nustep L1 x 6 min UE/Les  For ROM; good tolerance   fwd/back stepping 2x5 each LE Cueing for lighter UE support on handrail   wide stance EC 3x30" Sequentially bringing feel closer together with each set; most instability with more narrow stance   standing wide on foam 2x30" Mild- mod sway   alt toe tap on  cone x20 Weaning UE support; CGA; mild-mod instability     PATIENT EDUCATION: Education details: HEP update; encouraged ice to R knee x 10 min PRN Person educated: Patient Education method: Explanation, Demonstration, Tactile cues, Verbal cues, and Handouts Education comprehension: verbalized understanding and returned demonstration    HOME EXERCISE PROGRAM Last updated: 02/15/22 Access Code: [email or web address] URL: [email or web address] Date: 02/15/2022 Prepared by: [facility]  Exercises - Supine Posterior Pelvic Tilt  - 1 x daily - 7 x weekly - 1-3 sets - 10 reps - 3 sec hold - Hooklying Single Knee to Chest Stretch  - 1 x daily - 7 x weekly - 1 sets - 3 reps - 15-30 sec hold - Supine Lower Trunk  Rotation  - 1 x daily - 7 x weekly - 3 sets - 10 reps - 15-30 sec hold - Sidelying Thoracic Lumbar Rotation  - 1 x daily - 7 x weekly - 1 sets - 3 reps - 15-30 sec hold - Staggered Stance Forward Backward Weight Shift with Counter Support  - 1-2 x daily - 7 x weekly - 1-2 sets - 10 reps - Hooklying Single Leg Bent Knee Fallouts with Resistance  - 1 x daily - 5 x weekly - 2 sets - 10 reps - Supine Bridge  - 1 x daily - 5 x weekly - 2 sets - 10 reps - Standing Balance in Corner with Eyes Closed  - 1 x daily - 5 x weekly - 2 sets - 30 sec hold - Standing Toe Taps  - 1 x daily - 5 x weekly - 2 sets - 10 reps     ------------------------------------------------------------------------------- (objective measures completed at initial evaluation unless otherwise dated)   DIAGNOSTIC FINDINGS:  IMPRESSION: 1. L4-5 PLIF with resection of previously seen disc material. No residual spinal canal stenosis. 2. Unchanged moderate bilateral neural foraminal stenosis at L2-3 and L3-4. 3. Progression of severe right T11-12 and T12-L1 neural foraminal stenosis.   COGNITION: Overall cognitive status: Within functional limits for tasks assessed              SENSATION: WFL, denies parasthesias   COORDINATION: WNL   EDEMA:      MUSCLE TONE: WNL   Palpation:  lower thoracic/high lumbar paraspinals very tight and tender to palpation, decrease in lumbar lordosis notable     MUSCLE LENGTH: Left hamstrings tight and restricted vs right. Left SLR 35 degrees vs RLE SLR of 50-60 deg   DTRs:  DNT   POSTURE: No Significant postural limitations Side-sleeper, typically on the left      LOWER EXTREMITY ROM:    WFL   Lumbar ROM: flexion WFL                         Extension grossly limited                         Sidebending symmetrical 10% limited   LOWER EXTREMITY MMT:     MMT Right Eval Left Eval  Hip flexion 4 4  Hip extension      Hip abduction 4 4  Hip adduction      Hip internal rotation      Hip external rotation      Knee flexion 5 5  Knee extension 5 5  Ankle dorsiflexion 5 5  Ankle plantarflexion      Ankle inversion      Ankle eversion      (Blank rows = not tested)   Trunk flexion: 2+/5     BED MOBILITY:  Modified independent, increased time   TRANSFERS: Assistive device utilized: None  Sit to stand: Complete Independence Stand to sit: Complete Independence Chair to chair: Complete Independence Floor:  DNT       CURB:  Level of Assistance: Modified independence Assistive device utilized: Single point cane Curb Comments:        GAIT: Gait pattern: WFL Distance walked: pt report she is able to walk grocery store distances using shopping cart for support Assistive device utilized: Single point cane and None Level of assistance: Complete Independence and Modified independence Comments: reports use of cane for community distances, no AD  for household use   FUNCTIONAL TESTs:  6 minute walk test: NT Berg Balance Scale: NT   PATIENT SURVEYS:      TODAY'S TREATMENT:  Evaluation, education, HEP initiation     PATIENT EDUCATION: Education details: use of anti-fatigue mats, support pillow for  sleeping (pregnancy pillow), assessment findings Person educated: Patient Education method: Explanation and Handouts Education comprehension: needs further education     HOME EXERCISE PROGRAM: Access Code: [email or web address] URL: [email or web address] Date: 01/05/2022 Prepared by: Sherlyn Lees   Exercises - Supine Posterior Pelvic Tilt  - 1 x daily - 7 x weekly - 1-3 sets - 10 reps - 3 sec hold       GOALS: Goals reviewed with patient? Yes   SHORT TERM GOALS: Target date: 01/19/2022   Patient will be independent in HEP to improve functional outcomes with emphasis on core strength and hamstring flexibility Baseline: Goal status: MET for current HEP 01/31/22       LONG TERM GOALS: Target date: 02/28/2022   Pt to report successful positions/strategies for sleeping posture to improve quality of sleep and reduce back pain Baseline: Reports occasional LBP at night- reports that laying on either side is the most comfortable.  Goal status: MET 01/31/22   2.  Demo trunk flexion strength 3-/5 for improve stability Baseline: 2+/5; NT 01/31/22 Goal status: IN PROGRESS   3.  Report improved standing tolerance with use of anti-fatigue mat or other adaptations x 20 min to improve efficiency of housekeeping tasks Baseline: 5-10 min; reports 2-3 minutes 01/31/22 Goal status: IN PROGRESS 01/31/22   4.  Pt to report/demonstrate 3 strategies for self-management of chronic low back pain to improve comfort and QOL.  Baseline: main strategy at present is seated rest period;  Goal status: MET 01/31/22  5.  Pt to demonstrate safe floor transfer with mod I.   Baseline: required min A to get down to floor, mod A and heavy verbal-tactile cueing to stand up 01/31/22 Goal status: IN PROGRESS 01/31/22  6.  Pt to score at least 46/56 on Berg to indicate decreased risk of falls.    Baseline: 44/56 01/31/22 Goal status: IN PROGRESS 01/31/22     ASSESSMENT:   CLINICAL IMPRESSION: Patient  arrived to session with report of R knee pain since receiving a R knee gel injection last week. R lateral knee and proximal lateral and medial gastroc TTP but without red flag signs. Proceeded with balance activities to tolerance, providing occasional sitting rest breaks d/t knee pain. Patient able to perform multisensory balance activities with fairly good tolerance of EC balance- more difficulty with balance on foam and narrow stance. Updated HEP with balance exercises with edu on safety- patient reported understanding and without complaints at end of session.      OBJECTIVE IMPAIRMENTS decreased activity tolerance, decreased endurance, difficulty walking, decreased strength, impaired perceived functional ability, increased muscle spasms, impaired flexibility, improper body mechanics, postural dysfunction, and pain.    ACTIVITY LIMITATIONS carrying, standing, squatting, sleeping, bed mobility, and locomotion level   PARTICIPATION LIMITATIONS: meal prep, cleaning, laundry, and community activity   PERSONAL FACTORS Age, Time since onset of injury/illness/exacerbation, and 1 comorbidity: hx of lumbar fusion  are also affecting patient's functional outcome.    REHAB POTENTIAL: Good   CLINICAL DECISION MAKING: Stable/uncomplicated   EVALUATION COMPLEXITY: Low   PLAN: PT FREQUENCY: 12x/week   PT DURATION: 4 weeks   PLANNED INTERVENTIONS: Therapeutic exercises, Therapeutic activity, Neuromuscular re-education, Balance training, Gait training, Patient/Family education, Self Care, Joint  mobilization, Stair training, Vestibular training, Canalith repositioning, Orthotic/Fit training, DME instructions, Aquatic Therapy, Electrical stimulation, Spinal mobilization, Cryotherapy, Moist heat, Taping, Traction, Ionotophoresis 21m/ml Dexamethasone, and Manual therapy   PLAN FOR NEXT SESSION: practice balance activities with EC and SLS/stepping activities and update into HEP as appropriate; work on components  of floor transfers     YJanene Harvey PT, DPT 02/15/22 11:02 AM  CDeerfieldat [facility] Center[address] SPoso ParkGSan Luis  [id number] # [phone] # [phone]

## 2022-02-14 NOTE — Assessment & Plan Note (Signed)
Volume status is stable and blood pressure is well controlled.  She notes improvement in her edema since increasing her Lasix.  Encouraged her to keep exercising.  Blood pressure is well controlled as above.

## 2022-02-14 NOTE — Progress Notes (Signed)
Cardiology Office Note   Date:  02/14/2022   ID:  [name] [name] [name], DOB May 11, 1924, MRN [id number]  PCP:  Marin Olp, MD  Cardiologist:   Skeet Latch, MD   No chief complaint on file.    History of Present Illness: [name] [name] [name] is a 88 y.o. female with hypertension, hyperlipidemia, COPD, PAF, and PAD status post right femoral to above-knee popliteal bypass (2006) here for follow up.  She has a known stenosis of her peripheral bypass based on an arteriogram 05/2018 which revealed 80% narrowing of the mid right femoral-popliteal bypass graft.  She also had 70% stenosis of her left common femoral..  She previously deferred intervention because of concern about having an operation at her age.  However she has had increasing symptoms of claudication and last saw Dr. Oneida Alar on 7/29.  She previously underwent revision of her femoral to above-knee bypass in 2010.  This was extended to below the knee popliteal artery.  She is on chronic warfarin therapy since her thrombolysis procedure several years ago.  She is scheduled to undergo aortogram with lower extremity runoff on 11/29/2019.  She was referred to cardiology for presurgical risk assessment prior to redo of her right leg bypass on 8/16.  [name] [name] [name] had a stress echo 12/2011 that revealed normal systolic function and no ischemia.  [name] [name] [name] was seen in 2021 for preoperative risk assessment prior to redo femoral-popliteal bypass grafting.  At that appointment she was noted to be in atrial fibrillation, which was a new finding.  She was started on metoprolol for rate control and was already on warfarin.  After her surgery she developed rectal bleeding due to acute colitis.  That hospitalization was also complicated by atrial fibrillation with RVR.  She followed up with [name] [name] Memos, NP on 02/2020 and was doing well.  Prior to that appointment her furosemide was increased due to volume overload.  Echo 11/2019 revealed LVEF 60 to 65% with  mild left atrial enlargement and no other significant abnormalities.  She had ABIs 04/2020 which were 0.97 on the right and 1.29 on the left.  Arterial Dopplers revealed patent bypass grafts without stenosis.  She followed up with vascular 04/2021 and was stable. At the last visit with me, her BP was well controlled in the office but had been high at home. Metoprolol was increased and Lisinopril was switched to Entresto. She was volume overloaded and Lasix was increased. Se felt poorly and switched Entresto back to Lisinopril. BP was in the 110-120s over 70s. She followed up with [name] NP on 11/04/2021 and was feeling better but reported frequent nocturia.  Today, she states that she has not been feeling as well as she thinks she should. She wakes up most days with diffuse pain, primarily in her back, which cause her to feel nauseated and fatigued. She takes 2 Tylenol when she wakes up with some relief. She was referred for PT by Dr. Ranell Patrick. She does feel better with the exercises at PT and has seen improvement with her exercise tolerance. She states that she is scheduled to see vasc surg Dr. Carlis Abbott soon. She reports that she has felt better since switching back to the Lisinopril from the [place] as well. She denies any chest pain or shortness of breath. She does note some blurred vision at times that clears when she blinks.  She is staying busy and has gotten more involved in her church. She is active in her friend group and thoroughly  enjoys reading.   Past Medical History:  Diagnosis Date   Acute on chronic diastolic heart failure ([place]) 12/30/2020   Allergic rhinoconjunctivitis    Allergy    Anxiety    pt. managed- uses deep breathing    Arthritis    low back , stenosis   COLONIC POLYPS, RECURRENT 08/29/2006   2008 last colonoscopy. No further colonoscopy.      COPD (chronic obstructive pulmonary disease) (HCC)    Diverticulosis    Dysrhythmia    afib   Eczema    Environmental  allergies    allergy shot- q friday in Dr. Janee Morn office. PFT's abnormal- recommended Spiriva to use preop & will d/c after surgery   GERD (gastroesophageal reflux disease)    Hiatal hernia    Hyperlipidemia    Hypertension    Lung nodule 2011   Paroxysmal atrial fibrillation ([place]) 11/27/2019   Peripheral vascular disease (HCC)    Shingles    Stenosis of popliteal artery (HCC)    blood clots in legs long ago     Trigeminal neuralgia    Left buttocks    Past Surgical History:  Procedure Laterality Date   ABDOMINAL AORTAGRAM N/A 06/17/2011   Procedure: ABDOMINAL Maxcine Ham;  Surgeon: Elam Dutch, MD;  Location: [facility] CATH LAB;  Service: Cardiovascular;  Laterality: N/A;   ABDOMINAL AORTOGRAM W/LOWER EXTREMITY Bilateral 06/22/2018   Procedure: ABDOMINAL AORTOGRAM W/LOWER EXTREMITY;  Surgeon: Elam Dutch, MD;  Location: [facility] LAB;  Service: Cardiovascular;  Laterality: Bilateral;   ABDOMINAL AORTOGRAM W/LOWER EXTREMITY Bilateral 11/29/2019   Procedure: ABDOMINAL AORTOGRAM W/LOWER EXTREMITY;  Surgeon: Elam Dutch, MD;  Location: [facility] LAB;  Service: Cardiovascular;  Laterality: Bilateral;   CATARACT EXTRACTION, BILATERAL     w IOL   CHOLECYSTECTOMY  1998   COLONOSCOPY     ENDARTERECTOMY FEMORAL Left 12/09/2019   Procedure: ENDARTERECTOMY FEMORAL with bovine patch angioplasty.;  Surgeon: Elam Dutch, MD;  Location: [facility];  Service: Vascular;  Laterality: Left;   FEMORAL-POPLITEAL BYPASS GRAFT        x2 surgeries 1990's & 2009   FEMORAL-POPLITEAL BYPASS GRAFT Right 12/09/2019   Procedure: REDO RIGHT FEMORAL-POPLITEAL ARTERY BYPASS GRAFT;  Surgeon: Elam Dutch, MD;  Location: [facility];  Service: Vascular;  Laterality: Right;   INJECTION KNEE Right Aug. 2016   Gel injection for pain   LUMBAR FUSION  07/06/2011   TONSILLECTOMY     as a teenager    TUBAL LIGATION       Current Outpatient Medications  Medication Sig Dispense Refill   acetaminophen  (TYLENOL) 325 MG tablet Take 1-2 tablets (325-650 mg total) by mouth every 4 (four) hours as needed for mild pain (or temp >/= 101 F).     apixaban (ELIQUIS) 5 MG TABS tablet Take 1 tablet by mouth twice daily 180 tablet 1   atorvastatin (LIPITOR) 40 MG tablet Take 1 tablet by mouth once daily 90 tablet 0   baclofen (LIORESAL) 10 MG tablet TAKE 1/2 (ONE-HALF) TABLET BY MOUTH NIGHTLY 30 tablet 0   famotidine (PEPCID) 20 MG tablet Take '20mg'$  in the evening for 1 week. Then take as needed for indigestion. 30 tablet 2   furosemide (LASIX) 40 MG tablet Take 1 tablet (40 mg total) by mouth daily. 30 tablet 11   ipratropium (ATROVENT) 0.06 % nasal spray Place 2 sprays into the nose 2 (two) times daily. 15 mL 11   lidocaine (LIDODERM) 5 % Place 1 patch  onto the skin daily. Remove & Discard patch within 12 hours or as directed by MD 90 patch 3   lisinopril (ZESTRIL) 5 MG tablet Take 1 tablet (5 mg total) by mouth daily. 90 tablet 3   metoprolol succinate (TOPROL-XL) 100 MG 24 hr tablet Take 1 tablet (100 mg total) by mouth daily. Take with or immediately following a meal. 90 tablet 3   Multiple Vitamin (MULTIVITAMIN WITH MINERALS) TABS tablet Take 1 tablet by mouth daily.     pantoprazole (PROTONIX) 40 MG tablet Take one tablet twice daily as directed 60 tablet 11   RESTASIS 0.05 % ophthalmic emulsion Place 1 drop into both eyes 2 (two) times daily as needed (dry eyes).   99   No current facility-administered medications for this visit.    Allergies:   Sulfa antibiotics, Tiotropium bromide, Latex, Ultram [tramadol], and 5-alpha reductase inhibitors   Social History:  The patient  reports that she quit smoking about 28 years ago. Her smoking use included cigarettes. She has a 60.00 pack-year smoking history. She has never used smokeless tobacco. She reports that she does not drink alcohol and does not use drugs.   Family History:  The patient's family history includes Arthritis in her mother; Brain cancer  in her sister; Breast cancer in her daughter and sister; Colon cancer in her sister; Colon polyps in her father; Heart disease in her father; Hypertension in her mother and son; Lung cancer in her brother and sister.    ROS:   Please see the history of present illness. (+) LE edema (+) Fatigue (+) Imbalance (+) Chronic back pain (+) Dry eyes/blurry vision All other systems are reviewed and negative.     PHYSICAL EXAM: VS:  BP 114/60 (BP Location: Left Arm, Patient Position: Sitting, Cuff Size: Large)   Pulse 90   Ht '4\' 11"'$  (1.499 m)   Wt 166 lb (75.3 kg)   BMI 33.53 kg/m  , BMI Body mass index is 33.53 kg/m. GENERAL:  Well appearing HEENT:  Pupils equal round and reactive, fundi not visualized, oral mucosa unremarkable NECK:  No jugular venous distention, waveform within normal limits, carotid upstroke brisk and symmetric, no bruits LUNGS:  Clear to auscultation bilaterally HEART:  Irregularly irregular. Tachycardic.  PMI not displaced or sustained,S1 and S2 within normal limits, no S3, no S4, no clicks, no rubs, no murmurs. Distant heart sounds ABD:  Flat, positive bowel sounds normal in frequency in pitch, no bruits, no rebound, no guarding, no midline pulsatile mass, no hepatomegaly, no splenomegaly. EXT:  2+ radial pulses.  Trace edema, no cyanosis no clubbing SKIN:  No rashes no nodules NEURO:  Cranial nerves II through XII grossly intact, motor grossly intact throughout PSYCH:  Cognitively intact, oriented to person place and time  EKG:  EKG is personally reviewed. EKG today demonstrates Atrial fibrillation with rate of 90bpm and low voltage. 10/08/2021:  Atrial fibrillation. Rate 104 bpm.  PVC.  Low voltage. 12/30/20: Atrial fibrillation.  Rte 86 bpm.  Low voltage.   11/27/2019:  atrial fibrillation.  Rate 111 bpm.  LE Arterial Duplex Study  05/25/2021: Summary:  Right: Bakers cyst noted. Patent right femoral-popliteal bypass grafts  without evidence of stenosis.   Left:  Patent left femoral-popliteal bypass grafts without evidence of  stenosis.   ABI Doppler  05/25/2021: Right ABIs appear essentially unchanged. Left ABIs appear decreased.     Summary:  Right: Resting right ankle-brachial index is within normal range. No  evidence of significant right  lower extremity arterial disease. The right  toe-brachial index is abnormal.   Left: Resting left ankle-brachial index indicates noncompressible left  lower extremity arteries. The left toe-brachial index is abnormal.   Abdominal Aortogram  11/29/2019: Procedure: Abdominal aortogram with bilateral lower extremity runoff   Preoperative diagnosis: Rest pain right foot   Postoperative diagnosis: Same   Anesthesia: Local with sedation   Operative findings: 1.  90% stenosis left common femoral artery above femoral below-knee popliteal bypass two-vessel runoff left foot anterior tibial dominant   2.  Occlusion right femoral to below-knee popliteal bypass and native right superficial femoral and popliteal arteries.   3.  Patent right below-knee popliteal artery with one-vessel peroneal runoff   Operative details: After team informed consent, the patient taken the [place] lab.  The patient was placed in supine position angio table.  Both groins were prepped and draped in usual sterile fashion.  Ultrasound was used to examine the patient's left groin.  I could not find a reasonable window that appeared to have a good lumen within it and identify the left femoral-popliteal bypass.  Therefore I decided to stick the right groin.  Ultrasound was used to identify the right common femoral artery and femoral bifurcation.  Using ultrasound guidance after local anesthetic infiltration I was able to successfully cannulate the right common femoral artery and advance an 035 Rosen wire into the abdominal aorta.  A 6 French dilator was then placed over this to dilate up the tract through pre-existing scar.  5 French sheath was then placed  over the guidewire and thoroughly flushed with heparinized saline.  5 French pigtail catheter was then advanced into the abdominal aorta and abdominal aortogram obtained in AP projection.  Left and right renal arteries are patent.  Infrarenal abdominal aorta is patent.  The left and right common external and internal iliac arteries are all patent.  Next pigtail catheter was pulled down above the aortic bifurcation and bilateral oblique views of the pelvis were obtained with magnification.  This confirmed the above findings.  Additionally there is a 90% stenosis in the mid left common femoral artery above the level of her bypass.  On the right side the right common femoral is patent.  The right profundus patent.  The native right superficial femoral artery occludes at its origin.   Next bilateral lower extremity runoff views were obtained through the pigtail catheter.   In the right lower extremity, the right common femoral and profunda is patent.  The right femoropopliteal bypass is occluded throughout its course.  The native right superficial femoral artery above-knee popliteal and most of the below-knee popliteal artery is occluded.  The distal below-knee popliteal artery does reconstitute and gives off one-vessel runoff to the peroneal.   In the left lower extremity, the left common femoral artery is patent but there is a stenosis in the midportion about 90% which is just above the level of an existing femoral to below-knee popliteal bypass.  The profunda is patent.  The femoral to below-knee popliteal bypass is patent with two-vessel runoff of the left foot dominant AT runoff.  The native left superficial femoral and popliteal arteries are occluded.   At this point the 5 French pigtail catheter was removed over guidewire.  The 5 French sheath was thoroughly flushed with heparinized saline.  The sheath was left in place to be pulled in the holding area.   Operative management: The patient will be  scheduled on December 09, 2019 for redo right femoral  to below-knee popliteal bypass with PTFE.  Additionally we will do a left common femoral endarterectomy to salvage the left femoropopliteal bypass at the same operation.   Patient was updated on the plan and I discussed this with her daughter by phone.   The patient will be admitted tonight for 23-hour observation since she does not have anyone at home with her tonight  Echo 11/27/2019:  1. Left ventricular ejection fraction, by estimation, is 60 to 65%. The  left ventricle has normal function. The left ventricle has no regional  wall motion abnormalities. The left ventricular internal cavity size was  mildly dilated. Left ventricular  diastolic function could not be evaluated.   2. Right ventricular systolic function is normal. The right ventricular  size is normal. There is mildly elevated pulmonary artery systolic  pressure.   3. Left atrial size was mildly dilated.   4. The mitral valve is normal in structure. No evidence of mitral valve  regurgitation. No evidence of mitral stenosis.   5. The aortic valve is grossly normal. Aortic valve regurgitation is not  visualized. No aortic stenosis is present.    Recent Labs: 11/04/2021: ALT 10; BNP 212.9; Hemoglobin 13.7; Platelets 178; TSH 4.860 01/12/2022: BUN 29; Creatinine, Ser 1.31; Potassium 3.9; Sodium 139    Lipid Panel    Component Value Date/Time   CHOL 134 01/08/2021 0903   TRIG 130 01/08/2021 0903   TRIG 141 04/10/2006 0812   HDL 55 01/08/2021 0903   CHOLHDL 2.4 01/08/2021 0903   CHOLHDL 2.6 12/10/2019 0523   VLDL 15 12/10/2019 0523   LDLCALC 56 01/08/2021 0903   LDLDIRECT 46.0 06/10/2019 1132      Wt Readings from Last 3 Encounters:  02/14/22 166 lb (75.3 kg)  02/03/22 168 lb (76.2 kg)  12/28/21 163 lb 6.4 oz (74.1 kg)      ASSESSMENT AND PLAN:  Atrial fibrillation with rapid ventricular response ([place]) She remains in atrial fibrillation.  She is asymptomatic.   Rates are well-controlled.  Continue metoprolol and Eliquis.   Chronic diastolic heart failure (HCC) Volume status is stable and blood pressure is well controlled.  She notes improvement in her edema since increasing her Lasix.  Encouraged her to keep exercising.  Blood pressure is well controlled as above.  PAD (peripheral artery disease) ([place]) She has been experiencing more PD symptoms lately.  She goes to establish care with Dr. Carlis Abbott next week.  Continue atorvastatin and Eliquis.  He is due for fasting lipids.  Check lipids and a CMP.  LDL goal is less than 70.   Current medicines are reviewed at length with the patient today.  The patient does not have concerns regarding medicines.  The following changes have been made:  none  Labs/ tests ordered today include:   Orders Placed This Encounter  Procedures   Lipid panel   Comprehensive metabolic panel   EKG [id number]    Disposition:    F/u in 6 months.   I,Alexis Herring,acting as a Education administrator for Skeet Latch, MD.,have documented all relevant documentation on the behalf of Skeet Latch, MD,as directed by  Skeet Latch, MD while in the presence of Skeet Latch, MD.  I, [place] Oval Linsey, MD have reviewed all documentation for this visit.  The documentation of the exam, diagnosis, procedures, and orders on 02/14/2022 are all accurate and complete.    Signed, Paul Trettin C. Oval Linsey, MD, [facility]  02/14/2022 10:52 AM    [facility]

## 2022-02-14 NOTE — Patient Instructions (Addendum)
Medication Instructions:  Your physician recommends that you continue on your current medications as directed. Please refer to the Current Medication list given to you today.   *If you need a refill on your cardiac medications before your next appointment, please call your pharmacy*  Lab Work: FASTING LP/CMET SOON   Testing/Procedures: NONE  Follow-Up: At [facility], you and your health needs are our priority.  As part of our continuing mission to provide you with exceptional heart care, we have created designated Provider Care Teams.  These Care Teams include your primary Cardiologist (physician) and Advanced Practice Providers (APPs -  Physician Assistants and Nurse Practitioners) who all work together to provide you with the care you need, when you need it.  We recommend signing up for the patient portal called "MyChart".  Sign up information is provided on this After Visit Summary.  MyChart is used to connect with patients for Virtual Visits (Telemedicine).  Patients are able to view lab/test results, encounter notes, upcoming appointments, etc.  Non-urgent messages can be sent to your provider as well.   To learn more about what you can do with MyChart, go to [email or web address].    Your next appointment:   6 month(s)  The format for your next appointment:   In Person  Provider:   Skeet Latch, MD

## 2022-02-15 ENCOUNTER — Encounter: Payer: Self-pay | Admitting: Physical Therapy

## 2022-02-15 ENCOUNTER — Ambulatory Visit: Payer: Medicare Other | Admitting: Physical Therapy

## 2022-02-15 DIAGNOSIS — M545 Low back pain, unspecified: Secondary | ICD-10-CM

## 2022-02-15 DIAGNOSIS — R2681 Unsteadiness on feet: Secondary | ICD-10-CM

## 2022-02-15 DIAGNOSIS — R252 Cramp and spasm: Secondary | ICD-10-CM

## 2022-02-15 DIAGNOSIS — M6281 Muscle weakness (generalized): Secondary | ICD-10-CM | POA: Diagnosis not present

## 2022-02-15 DIAGNOSIS — G8929 Other chronic pain: Secondary | ICD-10-CM | POA: Diagnosis not present

## 2022-02-15 DIAGNOSIS — R2689 Other abnormalities of gait and mobility: Secondary | ICD-10-CM | POA: Diagnosis not present

## 2022-02-16 ENCOUNTER — Other Ambulatory Visit: Payer: Self-pay | Admitting: Physical Medicine and Rehabilitation

## 2022-02-17 ENCOUNTER — Encounter: Payer: Self-pay | Admitting: Physical Therapy

## 2022-02-17 ENCOUNTER — Ambulatory Visit: Payer: Medicare Other | Admitting: Physical Therapy

## 2022-02-17 DIAGNOSIS — M6281 Muscle weakness (generalized): Secondary | ICD-10-CM | POA: Diagnosis not present

## 2022-02-17 DIAGNOSIS — R2681 Unsteadiness on feet: Secondary | ICD-10-CM

## 2022-02-17 DIAGNOSIS — M545 Low back pain, unspecified: Secondary | ICD-10-CM | POA: Diagnosis not present

## 2022-02-17 DIAGNOSIS — R2689 Other abnormalities of gait and mobility: Secondary | ICD-10-CM | POA: Diagnosis not present

## 2022-02-17 DIAGNOSIS — R252 Cramp and spasm: Secondary | ICD-10-CM | POA: Diagnosis not present

## 2022-02-17 DIAGNOSIS — G8929 Other chronic pain: Secondary | ICD-10-CM | POA: Diagnosis not present

## 2022-02-17 NOTE — Therapy (Signed)
OUTPATIENT PHYSICAL THERAPY TREATMENT   Patient Name: [name] [name] MRN: [id number] DOB:1925/01/17, 88 y.o., female Today's Date: 02/17/2022  PCP:  Marin Olp, MD REFERRING PROVIDER: Izora Ribas, MD      END OF SESSION:   PT End of Session - 02/17/22 1018     Visit Number 11    Number of Visits 16    Date for PT Re-Evaluation 02/28/22    Authorization Type Medicare A/B; BCBS Supplemental 2023    Progress Note Due on Visit 18    PT Start Time 1020    PT Stop Time 1059    PT Time Calculation (min) 39 min    Equipment Utilized During Treatment --    Activity Tolerance Patient tolerated treatment well;Patient limited by pain    Behavior During Therapy WFL for tasks assessed/performed                   Past Medical History:  Diagnosis Date   Acute on chronic diastolic heart failure ([place]) 12/30/2020   Allergic rhinoconjunctivitis    Allergy    Anxiety    pt. managed- uses deep breathing    Arthritis    low back , stenosis   COLONIC POLYPS, RECURRENT 08/29/2006   2008 last colonoscopy. No further colonoscopy.      COPD (chronic obstructive pulmonary disease) (HCC)    Diverticulosis    Dysrhythmia    afib   Eczema    Environmental allergies    allergy shot- q friday in Dr. Janee Morn office. PFT's abnormal- recommended Spiriva to use preop & will d/c after surgery   GERD (gastroesophageal reflux disease)    Hiatal hernia    Hyperlipidemia    Hypertension    Lung nodule 2011   Paroxysmal atrial fibrillation ([place]) 11/27/2019   Peripheral vascular disease (HCC)    Shingles    Stenosis of popliteal artery (HCC)    blood clots in legs long ago     Trigeminal neuralgia    Left buttocks   Past Surgical History:  Procedure Laterality Date   ABDOMINAL AORTAGRAM N/A 06/17/2011   Procedure: ABDOMINAL Maxcine Ham;  Surgeon: Elam Dutch, MD;  Location: [facility] CATH LAB;  Service: Cardiovascular;  Laterality: N/A;   ABDOMINAL AORTOGRAM W/LOWER EXTREMITY  Bilateral 06/22/2018   Procedure: ABDOMINAL AORTOGRAM W/LOWER EXTREMITY;  Surgeon: Elam Dutch, MD;  Location: [place] CV LAB;  Service: Cardiovascular;  Laterality: Bilateral;   ABDOMINAL AORTOGRAM W/LOWER EXTREMITY Bilateral 11/29/2019   Procedure: ABDOMINAL AORTOGRAM W/LOWER EXTREMITY;  Surgeon: Elam Dutch, MD;  Location: [place] [facility];  Service: Cardiovascular;  Laterality: Bilateral;   CATARACT EXTRACTION, BILATERAL     w IOL   CHOLECYSTECTOMY  1998   COLONOSCOPY     ENDARTERECTOMY FEMORAL Left 12/09/2019   Procedure: ENDARTERECTOMY FEMORAL with bovine patch angioplasty.;  Surgeon: Elam Dutch, MD;  Location: [place];  Service: Vascular;  Laterality: Left;   FEMORAL-POPLITEAL BYPASS GRAFT        x2 surgeries 1990's & 2009   FEMORAL-POPLITEAL BYPASS GRAFT Right 12/09/2019   Procedure: REDO RIGHT FEMORAL-POPLITEAL ARTERY BYPASS GRAFT;  Surgeon: Elam Dutch, MD;  Location: [place];  Service: Vascular;  Laterality: Right;   INJECTION KNEE Right Aug. 2016   Gel injection for pain   LUMBAR FUSION  07/06/2011   TONSILLECTOMY     as a teenager    TUBAL LIGATION     Patient Active Problem List   Diagnosis Date Noted  Mild sleep apnea 05/03/2021   Chronic diastolic heart failure ([place]) 12/30/2020   Primary osteoarthritis of right knee 06/02/2020   Left groin hernia 05/22/2020   Sleep disturbance 01/09/2020   Acute lower UTI    Anxiety state    Peripheral edema    Leukocytosis    Debility 12/13/2019   Dyslipidemia    Rectal bleeding    Acute blood loss anemia    Ischemic colitis ([place])    Status post femoral-popliteal bypass surgery 12/09/2019   Persistent atrial fibrillation ([place]) 12/03/2019   Secondary hypercoagulable state ([place]) 12/03/2019   Chronic bilateral low back pain with right-sided sciatica 08/29/2019   Chronic pain of right knee 08/29/2019   Abnormality of gait 05/30/2019   Neuropathic pain 05/30/2019   Myalgia 08/08/2018   Chronic pain  syndrome 06/27/2018   Gross hematuria 06/01/2018   Obesity (BMI 30.0-34.9) 11/14/2017   Failed back syndrome 09/28/2017   CKD (chronic kidney disease), stage III ([place]) 08/07/2017   Unilateral primary osteoarthritis, right knee 04/07/2016   Acute gout of left foot 03/22/2016   Pain in right ankle and joints of right foot 08/28/2014   Trigeminal neuralgia    Encounter for therapeutic drug monitoring 10/21/2013   Syncope 12/29/2011   Chronic low back pain 09/13/2010   Allergic rhinitis due to pollen 06/25/2010   COPD mixed type ([place]) 01/22/2010   Solitary pulmonary nodule 01/12/2010   Insulin resistance 01/06/2009   Osteopenia 01/06/2009   Eczema 11/26/2007   Peripheral arterial disease ([place]) 06/29/2007   Hyperlipidemia 10/23/2006   Essential hypertension 10/23/2006   ESOPHAGEAL STRICTURE 08/09/1993    REFERRING DIAG: M79.10 (ICD-10-CM) - Myalgia   THERAPY DIAG:  Muscle weakness (generalized)  Unsteadiness on feet  Other abnormalities of gait and mobility  Rationale for Evaluation and Treatment Rehabilitation  PERTINENT HISTORY:  hx of lumbar fusion  PRECAUTIONS: None  SUBJECTIVE: Knee is better than last visit.  Typically it doesn't bother me.  Today is a stiffer day.  Some days are better than others.  PAIN:  Are you having pain? Yes: NPRS scale: 3-4/10 Pain location: R knee Pain description: sore Aggravating factors: knee bend  Relieving factors: time   OBJECTIVE:      TODAY'S TREATMENT: 02/17/22 Activity Comments     Nustep L1 x 6 min 4 extremities For flexibility; reports fatigue and more stiffness after NuStep today.  fwd/back stepping 2x5 each LE Cueing for lighter UE support on handrail   wide stance EC 2x30" Sequentially bringing feel closer together with each set; most instability with more narrow stance, no UE suuport (Review of HEP)  Forward/back walking along counter, 3 reps  Intermittent UE support  alt toe tap on cone x20 Light UE support; CGA;  mild instability (Review of HEP)  Seated postural exercises: -ant/post pelvic tilts 2 x 5 -forward lean>upright posture 2 x 5 -abdominal activation, alt UE lifts, BUE lifts, gentle trunk rotations Good form, reports back relief          HOME EXERCISE PROGRAM Last updated: 02/15/22 Access Code: [email or web address] URL: [email or web address] Date: 02/15/2022 Prepared by: [facility]  Exercises - Supine Posterior Pelvic Tilt  - 1 x daily - 7 x weekly - 1-3 sets - 10 reps - 3 sec hold - Hooklying Single Knee to Chest Stretch  - 1 x daily - 7 x weekly - 1 sets - 3 reps - 15-30 sec hold - Supine Lower Trunk Rotation  - 1 x daily -  7 x weekly - 3 sets - 10 reps - 15-30 sec hold - Sidelying Thoracic Lumbar Rotation  - 1 x daily - 7 x weekly - 1 sets - 3 reps - 15-30 sec hold - Staggered Stance Forward Backward Weight Shift with Counter Support  - 1-2 x daily - 7 x weekly - 1-2 sets - 10 reps - Hooklying Single Leg Bent Knee Fallouts with Resistance  - 1 x daily - 5 x weekly - 2 sets - 10 reps - Supine Bridge  - 1 x daily - 5 x weekly - 2 sets - 10 reps - Standing Balance in Corner with Eyes Closed  - 1 x daily - 5 x weekly - 2 sets - 30 sec hold - Standing Toe Taps  - 1 x daily - 5 x weekly - 2 sets - 10 reps     ------------------------------------------------------------------------------- (objective measures completed at initial evaluation unless otherwise dated)   DIAGNOSTIC FINDINGS:  IMPRESSION: 1. L4-5 PLIF with resection of previously seen disc material. No residual spinal canal stenosis. 2. Unchanged moderate bilateral neural foraminal stenosis at L2-3 and L3-4. 3. Progression of severe right T11-12 and T12-L1 neural foraminal stenosis.   COGNITION: Overall cognitive status: Within functional limits for tasks assessed             SENSATION: WFL, denies parasthesias   COORDINATION: WNL   EDEMA:      MUSCLE TONE: WNL    Palpation:  lower thoracic/high lumbar paraspinals very tight and tender to palpation, decrease in lumbar lordosis notable     MUSCLE LENGTH: Left hamstrings tight and restricted vs right. Left SLR 35 degrees vs RLE SLR of 50-60 deg   DTRs:  DNT   POSTURE: No Significant postural limitations Side-sleeper, typically on the left      LOWER EXTREMITY ROM:    WFL   Lumbar ROM: flexion WFL                         Extension grossly limited                         Sidebending symmetrical 10% limited   LOWER EXTREMITY MMT:     MMT Right Eval Left Eval  Hip flexion 4 4  Hip extension      Hip abduction 4 4  Hip adduction      Hip internal rotation      Hip external rotation      Knee flexion 5 5  Knee extension 5 5  Ankle dorsiflexion 5 5  Ankle plantarflexion      Ankle inversion      Ankle eversion      (Blank rows = not tested)   Trunk flexion: 2+/5     BED MOBILITY:  Modified independent, increased time   TRANSFERS: Assistive device utilized: None  Sit to stand: Complete Independence Stand to sit: Complete Independence Chair to chair: Complete Independence Floor:  DNT       CURB:  Level of Assistance: Modified independence Assistive device utilized: Single point cane Curb Comments:        GAIT: Gait pattern: WFL Distance walked: pt report she is able to walk grocery store distances using shopping cart for support Assistive device utilized: Single point cane and None Level of assistance: Complete Independence and Modified independence Comments: reports use of cane for community distances, no AD for household use   FUNCTIONAL TESTs:  6 minute walk test: NT Berg Balance Scale: NT   PATIENT SURVEYS:      TODAY'S TREATMENT:  Evaluation, education, HEP initiation     PATIENT EDUCATION: Education details: use of anti-fatigue mats, support pillow for sleeping (pregnancy pillow), assessment findings Person educated: Patient Education method:  Explanation and Handouts Education comprehension: needs further education     HOME EXERCISE PROGRAM: Access Code: [email or web address] URL: [email or web address] Date: 01/05/2022 Prepared by: Sherlyn Lees   Exercises - Supine Posterior Pelvic Tilt  - 1 x daily - 7 x weekly - 1-3 sets - 10 reps - 3 sec hold       GOALS: Goals reviewed with patient? Yes   SHORT TERM GOALS: Target date: 01/19/2022   Patient will be independent in HEP to improve functional outcomes with emphasis on core strength and hamstring flexibility Baseline: Goal status: MET for current HEP 01/31/22       LONG TERM GOALS: Target date: 02/28/2022   Pt to report successful positions/strategies for sleeping posture to improve quality of sleep and reduce back pain Baseline: Reports occasional LBP at night- reports that laying on either side is the most comfortable.  Goal status: MET 01/31/22   2.  Demo trunk flexion strength 3-/5 for improve stability Baseline: 2+/5; NT 01/31/22 Goal status: IN PROGRESS   3.  Report improved standing tolerance with use of anti-fatigue mat or other adaptations x 20 min to improve efficiency of housekeeping tasks Baseline: 5-10 min; reports 2-3 minutes 01/31/22 Goal status: IN PROGRESS 01/31/22   4.  Pt to report/demonstrate 3 strategies for self-management of chronic low back pain to improve comfort and QOL.  Baseline: main strategy at present is seated rest period; 10/26:  standing rocking, standing stretches Goal status: MET 01/31/22  5.  Pt to demonstrate safe floor transfer with mod I.   Baseline: required min A to get down to floor, mod A and heavy verbal-tactile cueing to stand up 01/31/22 Goal status: IN PROGRESS 01/31/22  6.  Pt to score at least 46/56 on Berg to indicate decreased risk of falls.    Baseline: 44/56 01/31/22 Goal status: IN PROGRESS 01/31/22     ASSESSMENT:   CLINICAL IMPRESSION: Skilled PT session focused on functional strength and  balance.  Pt reports having a weaker day today with her legs in general.  She requires multiple rest breaks between exercises.  She feels she is better with step taps and corner balance exercise after practice at home, compared to last visit.  Focused also on postural strengthening in sitting, with some relief to her back.  She will continue to benefit from skilled PT towards goals for improved functional mobility balance.   OBJECTIVE IMPAIRMENTS decreased activity tolerance, decreased endurance, difficulty walking, decreased strength, impaired perceived functional ability, increased muscle spasms, impaired flexibility, improper body mechanics, postural dysfunction, and pain.    ACTIVITY LIMITATIONS carrying, standing, squatting, sleeping, bed mobility, and locomotion level   PARTICIPATION LIMITATIONS: meal prep, cleaning, laundry, and community activity   PERSONAL FACTORS Age, Time since onset of injury/illness/exacerbation, and 1 comorbidity: hx of lumbar fusion  are also affecting patient's functional outcome.    REHAB POTENTIAL: Good   CLINICAL DECISION MAKING: Stable/uncomplicated   EVALUATION COMPLEXITY: Low   PLAN: PT FREQUENCY: 12x/week   PT DURATION: 4 weeks   PLANNED INTERVENTIONS: Therapeutic exercises, Therapeutic activity, Neuromuscular re-education, Balance training, Gait training, Patient/Family education, Self Care, Joint mobilization, Stair training, Vestibular training, Canalith repositioning, Orthotic/Fit training, DME instructions, Aquatic Therapy, Electrical stimulation,  Spinal mobilization, Cryotherapy, Moist heat, Taping, Traction, Ionotophoresis 60m/ml Dexamethasone, and Manual therapy   PLAN FOR NEXT SESSION: Continue practice balance activities with EC and SLS/stepping activities and update into HEP as appropriate; work on components of floor transfers.  Work towards [place]at are due 02/28/2022     AMady Haagensen PT 02/17/22 10:58 AM Phone: [phone]:  [phone]  [facility] [address] [place] [address]Phone # [phone] # [phone]

## 2022-02-18 DIAGNOSIS — Z23 Encounter for immunization: Secondary | ICD-10-CM | POA: Diagnosis not present

## 2022-02-21 ENCOUNTER — Ambulatory Visit: Payer: Medicare Other

## 2022-02-21 DIAGNOSIS — M545 Low back pain, unspecified: Secondary | ICD-10-CM | POA: Diagnosis not present

## 2022-02-21 DIAGNOSIS — R252 Cramp and spasm: Secondary | ICD-10-CM | POA: Diagnosis not present

## 2022-02-21 DIAGNOSIS — R2681 Unsteadiness on feet: Secondary | ICD-10-CM | POA: Diagnosis not present

## 2022-02-21 DIAGNOSIS — R2689 Other abnormalities of gait and mobility: Secondary | ICD-10-CM

## 2022-02-21 DIAGNOSIS — M6281 Muscle weakness (generalized): Secondary | ICD-10-CM | POA: Diagnosis not present

## 2022-02-21 DIAGNOSIS — G8929 Other chronic pain: Secondary | ICD-10-CM | POA: Diagnosis not present

## 2022-02-21 NOTE — Therapy (Signed)
OUTPATIENT PHYSICAL THERAPY TREATMENT   Patient Name: [name] [name] MRN: [id number] DOB:1924-07-27, 88 y.o., female Today's Date: 02/21/2022  PCP:  Marin Olp, MD REFERRING PROVIDER: Izora Ribas, MD      END OF SESSION:   PT End of Session - 02/21/22 1018     Visit Number 12    Number of Visits 16    Date for PT Re-Evaluation 02/28/22    Authorization Type Medicare A/B; BCBS Supplemental 2023    Progress Note Due on Visit 18    PT Start Time 1015    PT Stop Time 1100    PT Time Calculation (min) 45 min    Activity Tolerance Patient tolerated treatment well;Patient limited by pain    Behavior During Therapy [facility] for tasks assessed/performed                   Past Medical History:  Diagnosis Date   Acute on chronic diastolic heart failure ([place]) 12/30/2020   Allergic rhinoconjunctivitis    Allergy    Anxiety    pt. managed- uses deep breathing    Arthritis    low back , stenosis   COLONIC POLYPS, RECURRENT 08/29/2006   2008 last colonoscopy. No further colonoscopy.      COPD (chronic obstructive pulmonary disease) (HCC)    Diverticulosis    Dysrhythmia    afib   Eczema    Environmental allergies    allergy shot- q friday in Dr. Janee Morn office. PFT's abnormal- recommended Spiriva to use preop & will d/c after surgery   GERD (gastroesophageal reflux disease)    Hiatal hernia    Hyperlipidemia    Hypertension    Lung nodule 2011   Paroxysmal atrial fibrillation ([place]) 11/27/2019   Peripheral vascular disease (HCC)    Shingles    Stenosis of popliteal artery (HCC)    blood clots in legs long ago     Trigeminal neuralgia    Left buttocks   Past Surgical History:  Procedure Laterality Date   ABDOMINAL AORTAGRAM N/A 06/17/2011   Procedure: ABDOMINAL Maxcine Ham;  Surgeon: Elam Dutch, MD;  Location: [facility] CATH LAB;  Service: Cardiovascular;  Laterality: N/A;   ABDOMINAL AORTOGRAM W/LOWER EXTREMITY Bilateral 06/22/2018   Procedure: ABDOMINAL  AORTOGRAM W/LOWER EXTREMITY;  Surgeon: Elam Dutch, MD;  Location: [place] CV LAB;  Service: Cardiovascular;  Laterality: Bilateral;   ABDOMINAL AORTOGRAM W/LOWER EXTREMITY Bilateral 11/29/2019   Procedure: ABDOMINAL AORTOGRAM W/LOWER EXTREMITY;  Surgeon: Elam Dutch, MD;  Location: [place] CV LAB;  Service: Cardiovascular;  Laterality: Bilateral;   CATARACT EXTRACTION, BILATERAL     w IOL   CHOLECYSTECTOMY  1998   COLONOSCOPY     ENDARTERECTOMY FEMORAL Left 12/09/2019   Procedure: ENDARTERECTOMY FEMORAL with bovine patch angioplasty.;  Surgeon: Elam Dutch, MD;  Location: [place];  Service: Vascular;  Laterality: Left;   FEMORAL-POPLITEAL BYPASS GRAFT        x2 surgeries 1990's & 2009   FEMORAL-POPLITEAL BYPASS GRAFT Right 12/09/2019   Procedure: REDO RIGHT FEMORAL-POPLITEAL ARTERY BYPASS GRAFT;  Surgeon: Elam Dutch, MD;  Location: [place];  Service: Vascular;  Laterality: Right;   INJECTION KNEE Right Aug. 2016   Gel injection for pain   LUMBAR FUSION  07/06/2011   TONSILLECTOMY     as a teenager    TUBAL LIGATION     Patient Active Problem List   Diagnosis Date Noted   Mild sleep apnea 05/03/2021   Chronic  diastolic heart failure ([place]) 12/30/2020   Primary osteoarthritis of right knee 06/02/2020   Left groin hernia 05/22/2020   Sleep disturbance 01/09/2020   Acute lower UTI    Anxiety state    Peripheral edema    Leukocytosis    Debility 12/13/2019   Dyslipidemia    Rectal bleeding    Acute blood loss anemia    Ischemic colitis ([place])    Status post femoral-popliteal bypass surgery 12/09/2019   Persistent atrial fibrillation ([place]) 12/03/2019   Secondary hypercoagulable state ([place]) 12/03/2019   Chronic bilateral low back pain with right-sided sciatica 08/29/2019   Chronic pain of right knee 08/29/2019   Abnormality of gait 05/30/2019   Neuropathic pain 05/30/2019   Myalgia 08/08/2018   Chronic pain syndrome 06/27/2018   Gross hematuria 06/01/2018    Obesity (BMI 30.0-34.9) 11/14/2017   Failed back syndrome 09/28/2017   CKD (chronic kidney disease), stage III ([place]) 08/07/2017   Unilateral primary osteoarthritis, right knee 04/07/2016   Acute gout of left foot 03/22/2016   Pain in right ankle and joints of right foot 08/28/2014   Trigeminal neuralgia    Encounter for therapeutic drug monitoring 10/21/2013   Syncope 12/29/2011   Chronic low back pain 09/13/2010   Allergic rhinitis due to pollen 06/25/2010   COPD mixed type ([place]) 01/22/2010   Solitary pulmonary nodule 01/12/2010   Insulin resistance 01/06/2009   Osteopenia 01/06/2009   Eczema 11/26/2007   Peripheral arterial disease ([place]) 06/29/2007   Hyperlipidemia 10/23/2006   Essential hypertension 10/23/2006   ESOPHAGEAL STRICTURE 08/09/1993    REFERRING DIAG: M79.10 (ICD-10-CM) - Myalgia   THERAPY DIAG:  Muscle weakness (generalized)  Unsteadiness on feet  Other abnormalities of gait and mobility  Chronic bilateral low back pain without sciatica  Rationale for Evaluation and Treatment Rehabilitation  PERTINENT HISTORY:  hx of lumbar fusion  PRECAUTIONS: None  SUBJECTIVE: right knee has been having instances of buckling. Low back continues to wax/wane, "likely something that will affect me for the rest of my life"  PAIN:  Are you having pain? Yes: NPRS scale: 3-4/10 Pain location: R knee, low back Pain description: sore Aggravating factors: knee bend  Relieving factors: time   OBJECTIVE:   TODAY'S TREATMENT: 02/21/22 Activity Comments  NU-step level 4 x 5 min for gentle ROM  MHP applied to low back while performing  LAQ 2x10 Tenderness to posterio-medial knee, hamstring insertion, pes anserine  Standing on foam, self-selected foot position -EO 2x30 sec -EC 2x15 sec -head turns 5x EO/EC  Forwards tandem/backwards walk X 8 laps in parallel bars  Pt education  -ice pack recipe for use on right knee, advised using rollator to alleviate back pain and  provide additional support for right knee buckling             TODAY'S TREATMENT: 02/17/22 Activity Comments     Nustep L1 x 6 min 4 extremities For flexibility; reports fatigue and more stiffness after NuStep today.  fwd/back stepping 2x5 each LE Cueing for lighter UE support on handrail   wide stance EC 2x30" Sequentially bringing feel closer together with each set; most instability with more narrow stance, no UE suuport (Review of HEP)  Forward/back walking along counter, 3 reps  Intermittent UE support  alt toe tap on cone x20 Light UE support; CGA; mild instability (Review of HEP)  Seated postural exercises: -ant/post pelvic tilts 2 x 5 -forward lean>upright posture 2 x 5 -abdominal activation, alt UE lifts, BUE lifts, gentle trunk rotations  Good form, reports back relief          HOME EXERCISE PROGRAM Last updated: 02/15/22 Access Code: [email or web address] URL: [email or web address] Date: 02/15/2022 Prepared by: [facility]  Exercises - Supine Posterior Pelvic Tilt  - 1 x daily - 7 x weekly - 1-3 sets - 10 reps - 3 sec hold - Hooklying Single Knee to Chest Stretch  - 1 x daily - 7 x weekly - 1 sets - 3 reps - 15-30 sec hold - Supine Lower Trunk Rotation  - 1 x daily - 7 x weekly - 3 sets - 10 reps - 15-30 sec hold - Sidelying Thoracic Lumbar Rotation  - 1 x daily - 7 x weekly - 1 sets - 3 reps - 15-30 sec hold - Staggered Stance Forward Backward Weight Shift with Counter Support  - 1-2 x daily - 7 x weekly - 1-2 sets - 10 reps - Hooklying Single Leg Bent Knee Fallouts with Resistance  - 1 x daily - 5 x weekly - 2 sets - 10 reps - Supine Bridge  - 1 x daily - 5 x weekly - 2 sets - 10 reps - Standing Balance in Corner with Eyes Closed  - 1 x daily - 5 x weekly - 2 sets - 30 sec hold - Standing Toe Taps  - 1 x daily - 5 x weekly - 2 sets - 10  reps     ------------------------------------------------------------------------------- (objective measures completed at initial evaluation unless otherwise dated)   DIAGNOSTIC FINDINGS:  IMPRESSION: 1. L4-5 PLIF with resection of previously seen disc material. No residual spinal canal stenosis. 2. Unchanged moderate bilateral neural foraminal stenosis at L2-3 and L3-4. 3. Progression of severe right T11-12 and T12-L1 neural foraminal stenosis.   COGNITION: Overall cognitive status: Within functional limits for tasks assessed             SENSATION: WFL, denies parasthesias   COORDINATION: WNL   EDEMA:      MUSCLE TONE: WNL   Palpation:  lower thoracic/high lumbar paraspinals very tight and tender to palpation, decrease in lumbar lordosis notable     MUSCLE LENGTH: Left hamstrings tight and restricted vs right. Left SLR 35 degrees vs RLE SLR of 50-60 deg   DTRs:  DNT   POSTURE: No Significant postural limitations Side-sleeper, typically on the left      LOWER EXTREMITY ROM:    WFL   Lumbar ROM: flexion WFL                         Extension grossly limited                         Sidebending symmetrical 10% limited   LOWER EXTREMITY MMT:     MMT Right Eval Left Eval  Hip flexion 4 4  Hip extension      Hip abduction 4 4  Hip adduction      Hip internal rotation      Hip external rotation      Knee flexion 5 5  Knee extension 5 5  Ankle dorsiflexion 5 5  Ankle plantarflexion      Ankle inversion      Ankle eversion      (Blank rows = not tested)   Trunk flexion: 2+/5     BED MOBILITY:  Modified independent, increased time   TRANSFERS: Assistive device utilized: None  Sit to stand: Complete Independence Stand to sit: Complete Independence Chair to chair: Complete Independence Floor:  DNT       CURB:  Level of Assistance: Modified independence Assistive device utilized: Single point cane Curb Comments:        GAIT: Gait  pattern: WFL Distance walked: pt report she is able to walk grocery store distances using shopping cart for support Assistive device utilized: Single point cane and None Level of assistance: Complete Independence and Modified independence Comments: reports use of cane for community distances, no AD for household use   FUNCTIONAL TESTs:  6 minute walk test: NT Berg Balance Scale: NT   PATIENT SURVEYS:      TODAY'S TREATMENT:  Evaluation, education, HEP initiation     PATIENT EDUCATION: Education details: use of anti-fatigue mats, support pillow for sleeping (pregnancy pillow), assessment findings Person educated: Patient Education method: Explanation and Handouts Education comprehension: needs further education     HOME EXERCISE PROGRAM: Access Code: [email or web address] URL: [email or web address] Date: 01/05/2022 Prepared by: Sherlyn Lees   Exercises - Supine Posterior Pelvic Tilt  - 1 x daily - 7 x weekly - 1-3 sets - 10 reps - 3 sec hold       GOALS: Goals reviewed with patient? Yes   SHORT TERM GOALS: Target date: 01/19/2022   Patient will be independent in HEP to improve functional outcomes with emphasis on core strength and hamstring flexibility Baseline: Goal status: MET for current HEP 01/31/22       LONG TERM GOALS: Target date: 02/28/2022   Pt to report successful positions/strategies for sleeping posture to improve quality of sleep and reduce back pain Baseline: Reports occasional LBP at night- reports that laying on either side is the most comfortable.  Goal status: MET 01/31/22   2.  Demo trunk flexion strength 3-/5 for improve stability Baseline: 2+/5; NT 01/31/22 Goal status: IN PROGRESS   3.  Report improved standing tolerance with use of anti-fatigue mat or other adaptations x 20 min to improve efficiency of housekeeping tasks Baseline: 5-10 min; reports 2-3 minutes 01/31/22 Goal status: IN PROGRESS 01/31/22   4.  Pt to report/demonstrate 3  strategies for self-management of chronic low back pain to improve comfort and QOL.  Baseline: main strategy at present is seated rest period; 10/26:  standing rocking, standing stretches Goal status: MET 01/31/22  5.  Pt to demonstrate safe floor transfer with mod I.   Baseline: required min A to get down to floor, mod A and heavy verbal-tactile cueing to stand up 01/31/22 Goal status: IN PROGRESS 01/31/22  6.  Pt to score at least 46/56 on Berg to indicate decreased risk of falls.    Baseline: 44/56 01/31/22 Goal status: IN PROGRESS 01/31/22     ASSESSMENT:   CLINICAL IMPRESSION: Pt notes not much change in her back discomfort and has noticed that since receiving the injection in her right knee a week ago that she has been experiencing tenderness in posterior/medial right knee and instances of knee buckling. Provided "recipe" for homemade ice pack and advised to ice several times a day to see if that decreases knee pain and to use rollator to provide for more stability vs cane given her report of knee buckling. Activities modified today to provide for more practice of static balance vs dynamic due to knee pain/instability with emphasis on compliant surface and eyes closed condition which required maintaining at least one-finger support on rail for reference/support. Continued sessions to progress POC and reduce  risk for falls as well as improve musculoskeletal issues/limitations  OBJECTIVE IMPAIRMENTS decreased activity tolerance, decreased endurance, difficulty walking, decreased strength, impaired perceived functional ability, increased muscle spasms, impaired flexibility, improper body mechanics, postural dysfunction, and pain.    ACTIVITY LIMITATIONS carrying, standing, squatting, sleeping, bed mobility, and locomotion level   PARTICIPATION LIMITATIONS: meal prep, cleaning, laundry, and community activity   PERSONAL FACTORS Age, Time since onset of injury/illness/exacerbation, and 1  comorbidity: hx of lumbar fusion  are also affecting patient's functional outcome.    REHAB POTENTIAL: Good   CLINICAL DECISION MAKING: Stable/uncomplicated   EVALUATION COMPLEXITY: Low   PLAN: PT FREQUENCY: 12x/week   PT DURATION: 4 weeks   PLANNED INTERVENTIONS: Therapeutic exercises, Therapeutic activity, Neuromuscular re-education, Balance training, Gait training, Patient/Family education, Self Care, Joint mobilization, Stair training, Vestibular training, Canalith repositioning, Orthotic/Fit training, DME instructions, Aquatic Therapy, Electrical stimulation, Spinal mobilization, Cryotherapy, Moist heat, Taping, Traction, Ionotophoresis 1m/ml Dexamethasone, and Manual therapy   PLAN FOR NEXT SESSION: Continue practice balance activities with EC and SLS/stepping activities and update into HEP as appropriate; work on components of floor transfers.  Work towards LLuvernethat are due 02/28/2022     11:15 AM, 02/21/22 M. KSherlyn Lees PT, DPT Physical Therapist- CFranklinOffice Number: [phone]   [facility]anger[facility] B[facility] SEidson RoadGGarden Grove [place] [address]Phone # [phone] # [phone]

## 2022-02-22 ENCOUNTER — Encounter: Payer: Self-pay | Admitting: Physical Medicine and Rehabilitation

## 2022-02-22 ENCOUNTER — Encounter
Payer: Medicare Other | Attending: Physical Medicine and Rehabilitation | Admitting: Physical Medicine and Rehabilitation

## 2022-02-22 VITALS — BP 124/64 | HR 87 | Ht 59.0 in | Wt 166.6 lb

## 2022-02-22 DIAGNOSIS — M791 Myalgia, unspecified site: Secondary | ICD-10-CM | POA: Insufficient documentation

## 2022-02-22 MED ORDER — LIDOCAINE HCL 1 % IJ SOLN
3.0000 mL | Freq: Once | INTRAMUSCULAR | Status: AC
  Administered 2022-02-22: 3 mL

## 2022-02-22 MED ORDER — SODIUM CHLORIDE (PF) 0.9 % IJ SOLN
2.0000 mL | Freq: Once | INTRAMUSCULAR | Status: AC
  Administered 2022-02-22: 2 mL

## 2022-02-22 NOTE — Progress Notes (Signed)
Trigger Point Injection  Indication: Lumbar myofascial pain not relieved by medication management and other conservative care.  Informed consent was obtained after describing risk and benefits of the procedure with the patient, this includes bleeding, bruising, infection and medication side effects.  The patient wishes to proceed and has given written consent.  The patient was placed in a seated position.  The area of pain was marked and prepped with Betadine.  It was entered with a 25-gauge 1/2 inch needle and a total of 5 mL of 1% lidocaine and normal saline was injected into a total of 4 trigger points, after negative draw back for blood.  The patient tolerated the procedure well.  Post procedure instructions were given.     

## 2022-02-22 NOTE — Therapy (Signed)
OUTPATIENT PHYSICAL THERAPY TREATMENT   Patient Name: [name] [name] MRN: [id number] DOB:Apr 21, 1925, 88 y.o., female Today's Date: 02/23/2022  PCP:  Marin Olp, MD REFERRING PROVIDER: Izora Ribas, MD      END OF SESSION:   PT End of Session - 02/23/22 1058     Visit Number 13    Number of Visits 16    Date for PT Re-Evaluation 02/28/22    Authorization Type Medicare A/B; BCBS Supplemental 2023    Progress Note Due on Visit 18    PT Start Time 1017    PT Stop Time 1058    PT Time Calculation (min) 41 min    Equipment Utilized During Treatment Gait belt    Activity Tolerance Patient tolerated treatment well;Patient limited by fatigue    Behavior During Therapy WFL for tasks assessed/performed                    Past Medical History:  Diagnosis Date   Acute on chronic diastolic heart failure ([place]) 12/30/2020   Allergic rhinoconjunctivitis    Allergy    Anxiety    pt. managed- uses deep breathing    Arthritis    low back , stenosis   COLONIC POLYPS, RECURRENT 08/29/2006   2008 last colonoscopy. No further colonoscopy.      COPD (chronic obstructive pulmonary disease) (HCC)    Diverticulosis    Dysrhythmia    afib   Eczema    Environmental allergies    allergy shot- q friday in Dr. Janee Morn office. PFT's abnormal- recommended Spiriva to use preop & will d/c after surgery   GERD (gastroesophageal reflux disease)    Hiatal hernia    Hyperlipidemia    Hypertension    Lung nodule 2011   Paroxysmal atrial fibrillation ([place]) 11/27/2019   Peripheral vascular disease (HCC)    Shingles    Stenosis of popliteal artery (HCC)    blood clots in legs long ago     Trigeminal neuralgia    Left buttocks   Past Surgical History:  Procedure Laterality Date   ABDOMINAL AORTAGRAM N/A 06/17/2011   Procedure: ABDOMINAL Maxcine Ham;  Surgeon: Elam Dutch, MD;  Location: [facility] CATH LAB;  Service: Cardiovascular;  Laterality: N/A;   ABDOMINAL AORTOGRAM W/LOWER  EXTREMITY Bilateral 06/22/2018   Procedure: ABDOMINAL AORTOGRAM W/LOWER EXTREMITY;  Surgeon: Elam Dutch, MD;  Location: [place] CV LAB;  Service: Cardiovascular;  Laterality: Bilateral;   ABDOMINAL AORTOGRAM W/LOWER EXTREMITY Bilateral 11/29/2019   Procedure: ABDOMINAL AORTOGRAM W/LOWER EXTREMITY;  Surgeon: Elam Dutch, MD;  Location: [place] CV LAB;  Service: Cardiovascular;  Laterality: Bilateral;   CATARACT EXTRACTION, BILATERAL     w IOL   CHOLECYSTECTOMY  1998   COLONOSCOPY     ENDARTERECTOMY FEMORAL Left 12/09/2019   Procedure: ENDARTERECTOMY FEMORAL with bovine patch angioplasty.;  Surgeon: Elam Dutch, MD;  Location: [place];  Service: Vascular;  Laterality: Left;   FEMORAL-POPLITEAL BYPASS GRAFT        x2 surgeries 1990's & 2009   FEMORAL-POPLITEAL BYPASS GRAFT Right 12/09/2019   Procedure: REDO RIGHT FEMORAL-POPLITEAL ARTERY BYPASS GRAFT;  Surgeon: Elam Dutch, MD;  Location: [place];  Service: Vascular;  Laterality: Right;   INJECTION KNEE Right Aug. 2016   Gel injection for pain   LUMBAR FUSION  07/06/2011   TONSILLECTOMY     as a teenager    TUBAL LIGATION     Patient Active Problem List   Diagnosis Date  Noted   Mild sleep apnea 05/03/2021   Chronic diastolic heart failure ([place]) 12/30/2020   Primary osteoarthritis of right knee 06/02/2020   Left groin hernia 05/22/2020   Sleep disturbance 01/09/2020   Acute lower UTI    Anxiety state    Peripheral edema    Leukocytosis    Debility 12/13/2019   Dyslipidemia    Rectal bleeding    Acute blood loss anemia    Ischemic colitis ([place])    Status post femoral-popliteal bypass surgery 12/09/2019   Persistent atrial fibrillation ([place]) 12/03/2019   Secondary hypercoagulable state ([place]) 12/03/2019   Chronic bilateral low back pain with right-sided sciatica 08/29/2019   Chronic pain of right knee 08/29/2019   Abnormality of gait 05/30/2019   Neuropathic pain 05/30/2019   Myalgia 08/08/2018   Chronic  pain syndrome 06/27/2018   Gross hematuria 06/01/2018   Obesity (BMI 30.0-34.9) 11/14/2017   Failed back syndrome 09/28/2017   CKD (chronic kidney disease), stage III ([place]) 08/07/2017   Unilateral primary osteoarthritis, right knee 04/07/2016   Acute gout of left foot 03/22/2016   Pain in right ankle and joints of right foot 08/28/2014   Trigeminal neuralgia    Encounter for therapeutic drug monitoring 10/21/2013   Syncope 12/29/2011   Chronic low back pain 09/13/2010   Allergic rhinitis due to pollen 06/25/2010   COPD mixed type ([place]) 01/22/2010   Solitary pulmonary nodule 01/12/2010   Insulin resistance 01/06/2009   Osteopenia 01/06/2009   Eczema 11/26/2007   Peripheral arterial disease ([place]) 06/29/2007   Hyperlipidemia 10/23/2006   Essential hypertension 10/23/2006   ESOPHAGEAL STRICTURE 08/09/1993    REFERRING DIAG: M79.10 (ICD-10-CM) - Myalgia   THERAPY DIAG:  Muscle weakness (generalized)  Unsteadiness on feet  Other abnormalities of gait and mobility  Chronic bilateral low back pain without sciatica  Cramp and spasm  Rationale for Evaluation and Treatment Rehabilitation  PERTINENT HISTORY:  hx of lumbar fusion  PRECAUTIONS: None  SUBJECTIVE: Claiborne Billings says I'm supposed to be on my walker but it's so windy outside. Had some trigger point injections in LB and it is feeling better. Backs of the legs are more tired today.   PAIN:  Are you having pain? No   OBJECTIVE:    TODAY'S TREATMENT: 02/23/22 Activity Comments  Nustep L5 x 6 min Ues/Les  Maintaining ~80SPM  R/L HS stretch seated 30" each   alt toe tap on cone 2x20 CGA-mod A d/t instability   mini squat 2x10 Cueing to avoid hanging on the arms and to sit back into squat; rest break required d/t "back pulling"  backwards walking 4x77f Cueing to widen BOS which improved stability   tandem stance 30" each  Occasional UE support required  sidestepping over cone  1 UE support; tendency to circumduct with LMagnetLast updated: 02/15/22 Access Code: [email or web address] URL: [email or web address] Date: 02/15/2022 Prepared by: [facility]  Exercises - Supine Posterior Pelvic Tilt  - 1 x daily - 7 x weekly - 1-3 sets - 10 reps - 3 sec hold - Hooklying Single Knee to Chest Stretch  - 1 x daily - 7 x weekly - 1 sets - 3 reps - 15-30 sec hold - Supine Lower Trunk Rotation  - 1 x daily - 7 x weekly - 3 sets - 10 reps - 15-30 sec hold - Sidelying Thoracic Lumbar Rotation  - 1 x daily - 7 x weekly -  1 sets - 3 reps - 15-30 sec hold - Staggered Stance Forward Backward Weight Shift with Counter Support  - 1-2 x daily - 7 x weekly - 1-2 sets - 10 reps - Hooklying Single Leg Bent Knee Fallouts with Resistance  - 1 x daily - 5 x weekly - 2 sets - 10 reps - Supine Bridge  - 1 x daily - 5 x weekly - 2 sets - 10 reps - Standing Balance in Corner with Eyes Closed  - 1 x daily - 5 x weekly - 2 sets - 30 sec hold - Standing Toe Taps  - 1 x daily - 5 x weekly - 2 sets - 10 reps     ------------------------------------------------------------------------------- (objective measures completed at initial evaluation unless otherwise dated)   DIAGNOSTIC FINDINGS:  IMPRESSION: 1. L4-5 PLIF with resection of previously seen disc material. No residual spinal canal stenosis. 2. Unchanged moderate bilateral neural foraminal stenosis at L2-3 and L3-4. 3. Progression of severe right T11-12 and T12-L1 neural foraminal stenosis.   COGNITION: Overall cognitive status: Within functional limits for tasks assessed             SENSATION: WFL, denies parasthesias   COORDINATION: WNL   EDEMA:      MUSCLE TONE: WNL   Palpation:  lower thoracic/high lumbar paraspinals very tight and tender to palpation, decrease in lumbar lordosis notable     MUSCLE LENGTH: Left hamstrings tight and restricted vs right. Left SLR 35 degrees vs RLE SLR of 50-60  deg   DTRs:  DNT   POSTURE: No Significant postural limitations Side-sleeper, typically on the left      LOWER EXTREMITY ROM:    WFL   Lumbar ROM: flexion WFL                         Extension grossly limited                         Sidebending symmetrical 10% limited   LOWER EXTREMITY MMT:     MMT Right Eval Left Eval  Hip flexion 4 4  Hip extension      Hip abduction 4 4  Hip adduction      Hip internal rotation      Hip external rotation      Knee flexion 5 5  Knee extension 5 5  Ankle dorsiflexion 5 5  Ankle plantarflexion      Ankle inversion      Ankle eversion      (Blank rows = not tested)   Trunk flexion: 2+/5     BED MOBILITY:  Modified independent, increased time   TRANSFERS: Assistive device utilized: None  Sit to stand: Complete Independence Stand to sit: Complete Independence Chair to chair: Complete Independence Floor:  DNT       CURB:  Level of Assistance: Modified independence Assistive device utilized: Single point cane Curb Comments:        GAIT: Gait pattern: WFL Distance walked: pt report she is able to walk grocery store distances using shopping cart for support Assistive device utilized: Single point cane and None Level of assistance: Complete Independence and Modified independence Comments: reports use of cane for community distances, no AD for household use   FUNCTIONAL TESTs:  6 minute walk test: NT Berg Balance Scale: NT   PATIENT SURVEYS:      TODAY'S TREATMENT:  Evaluation, education, HEP initiation  PATIENT EDUCATION: Education details: use of anti-fatigue mats, support pillow for sleeping (pregnancy pillow), assessment findings Person educated: Patient Education method: Explanation and Handouts Education comprehension: needs further education     HOME EXERCISE PROGRAM: Access Code: [email or web address] URL: [email or web address] Date: 01/05/2022 Prepared by: Sherlyn Lees   Exercises - Supine  Posterior Pelvic Tilt  - 1 x daily - 7 x weekly - 1-3 sets - 10 reps - 3 sec hold       GOALS: Goals reviewed with patient? Yes   SHORT TERM GOALS: Target date: 01/19/2022   Patient will be independent in HEP to improve functional outcomes with emphasis on core strength and hamstring flexibility Baseline: Goal status: MET for current HEP 01/31/22       LONG TERM GOALS: Target date: 02/28/2022   Pt to report successful positions/strategies for sleeping posture to improve quality of sleep and reduce back pain Baseline: Reports occasional LBP at night- reports that laying on either side is the most comfortable.  Goal status: MET 01/31/22   2.  Demo trunk flexion strength 3-/5 for improve stability Baseline: 2+/5; NT 01/31/22 Goal status: IN PROGRESS   3.  Report improved standing tolerance with use of anti-fatigue mat or other adaptations x 20 min to improve efficiency of housekeeping tasks Baseline: 5-10 min; reports 2-3 minutes 01/31/22 Goal status: IN PROGRESS 01/31/22   4.  Pt to report/demonstrate 3 strategies for self-management of chronic low back pain to improve comfort and QOL.  Baseline: main strategy at present is seated rest period; 10/26:  standing rocking, standing stretches Goal status: MET 01/31/22  5.  Pt to demonstrate safe floor transfer with mod I.   Baseline: required min A to get down to floor, mod A and heavy verbal-tactile cueing to stand up 01/31/22 Goal status: IN PROGRESS 01/31/22  6.  Pt to score at least 46/56 on Berg to indicate decreased risk of falls.    Baseline: 44/56 01/31/22 Goal status: IN PROGRESS 01/31/22     ASSESSMENT:   CLINICAL IMPRESSION: Patient arrived to session with report of getting trigger point injections in the LB yesterday and improved pain levels as a result. Did report increased LE fatigue today, which was addressed with stretching. Worked on standing balance activities with reduction in UE support to improve balance  confidence. Dynamic balance activities required cueing for wider BOS which improved stability. Patient performed LE strengthening with cueing for improved form with good effort and carryover. Patient tolerated session well with short intermittent sit breaks d/t LE fatigue. No complaints at end of session.    OBJECTIVE IMPAIRMENTS decreased activity tolerance, decreased endurance, difficulty walking, decreased strength, impaired perceived functional ability, increased muscle spasms, impaired flexibility, improper body mechanics, postural dysfunction, and pain.    ACTIVITY LIMITATIONS carrying, standing, squatting, sleeping, bed mobility, and locomotion level   PARTICIPATION LIMITATIONS: meal prep, cleaning, laundry, and community activity   PERSONAL FACTORS Age, Time since onset of injury/illness/exacerbation, and 1 comorbidity: hx of lumbar fusion  are also affecting patient's functional outcome.    REHAB POTENTIAL: Good   CLINICAL DECISION MAKING: Stable/uncomplicated   EVALUATION COMPLEXITY: Low   PLAN: PT FREQUENCY: 12x/week   PT DURATION: 4 weeks   PLANNED INTERVENTIONS: Therapeutic exercises, Therapeutic activity, Neuromuscular re-education, Balance training, Gait training, Patient/Family education, Self Care, Joint mobilization, Stair training, Vestibular training, Canalith repositioning, Orthotic/Fit training, DME instructions, Aquatic Therapy, Electrical stimulation, Spinal mobilization, Cryotherapy, Moist heat, Taping, Traction, Ionotophoresis 30m/ml Dexamethasone, and Manual therapy   PLAN FOR NEXT SESSION:  floor transfers; Continue practice balance activities with EC and SLS/stepping activities and update into HEP as appropriate; work on components of floor transfers.  Work towards [place] that are due 02/28/2022    Janene Harvey, PT, DPT 02/23/22 11:00 AM  [facility] at [facility] [address], [place], [place] [address] Phone #  [phone] Fax # [phone]

## 2022-02-23 ENCOUNTER — Encounter: Payer: Self-pay | Admitting: Physical Therapy

## 2022-02-23 ENCOUNTER — Ambulatory Visit: Payer: Medicare Other | Attending: Physical Medicine and Rehabilitation | Admitting: Physical Therapy

## 2022-02-23 DIAGNOSIS — R2689 Other abnormalities of gait and mobility: Secondary | ICD-10-CM | POA: Insufficient documentation

## 2022-02-23 DIAGNOSIS — M6281 Muscle weakness (generalized): Secondary | ICD-10-CM | POA: Insufficient documentation

## 2022-02-23 DIAGNOSIS — M545 Low back pain, unspecified: Secondary | ICD-10-CM | POA: Insufficient documentation

## 2022-02-23 DIAGNOSIS — R252 Cramp and spasm: Secondary | ICD-10-CM | POA: Diagnosis not present

## 2022-02-23 DIAGNOSIS — G8929 Other chronic pain: Secondary | ICD-10-CM | POA: Diagnosis not present

## 2022-02-23 DIAGNOSIS — R2681 Unsteadiness on feet: Secondary | ICD-10-CM | POA: Insufficient documentation

## 2022-02-24 ENCOUNTER — Ambulatory Visit: Payer: Medicare Other

## 2022-02-24 DIAGNOSIS — I10 Essential (primary) hypertension: Secondary | ICD-10-CM | POA: Diagnosis not present

## 2022-02-24 DIAGNOSIS — E7800 Pure hypercholesterolemia, unspecified: Secondary | ICD-10-CM | POA: Diagnosis not present

## 2022-02-24 LAB — COMPREHENSIVE METABOLIC PANEL WITH GFR
ALT: 10 IU/L (ref 0–32)
AST: 21 IU/L (ref 0–40)
Albumin/Globulin Ratio: 2.1 (ref 1.2–2.2)
Albumin: 4.4 g/dL (ref 3.6–4.6)
Alkaline Phosphatase: 85 IU/L (ref 44–121)
BUN/Creatinine Ratio: 27 (ref 12–28)
BUN: 36 mg/dL (ref 10–36)
Bilirubin Total: 0.8 mg/dL (ref 0.0–1.2)
CO2: 23 mmol/L (ref 20–29)
Calcium: 9.3 mg/dL (ref 8.7–10.3)
Chloride: 100 mmol/L (ref 96–106)
Creatinine, Ser: 1.35 mg/dL — ABNORMAL HIGH (ref 0.57–1.00)
Globulin, Total: 2.1 g/dL (ref 1.5–4.5)
Glucose: 132 mg/dL — ABNORMAL HIGH (ref 70–99)
Potassium: 4.4 mmol/L (ref 3.5–5.2)
Sodium: 140 mmol/L (ref 134–144)
Total Protein: 6.5 g/dL (ref 6.0–8.5)
eGFR: 36 mL/min/1.73 — ABNORMAL LOW

## 2022-02-24 LAB — LIPID PANEL
Chol/HDL Ratio: 2.4 ratio (ref 0.0–4.4)
Cholesterol, Total: 129 mg/dL (ref 100–199)
HDL: 53 mg/dL (ref >39)
LDL Chol Calc (NIH): 53 mg/dL (ref 0–99)
Triglycerides: 134 mg/dL (ref 0–149)
VLDL Cholesterol Cal: 23 mg/dL (ref 5–40)

## 2022-02-25 ENCOUNTER — Ambulatory Visit (INDEPENDENT_AMBULATORY_CARE_PROVIDER_SITE_OTHER): Payer: Medicare Other | Admitting: Family Medicine

## 2022-02-25 ENCOUNTER — Encounter: Payer: Self-pay | Admitting: Family Medicine

## 2022-02-25 VITALS — BP 104/60 | HR 61 | Temp 97.7°F | Ht 59.0 in | Wt 164.8 lb

## 2022-02-25 DIAGNOSIS — I10 Essential (primary) hypertension: Secondary | ICD-10-CM | POA: Diagnosis not present

## 2022-02-25 DIAGNOSIS — E7800 Pure hypercholesterolemia, unspecified: Secondary | ICD-10-CM

## 2022-02-25 DIAGNOSIS — I739 Peripheral vascular disease, unspecified: Secondary | ICD-10-CM

## 2022-02-25 DIAGNOSIS — E88819 Insulin resistance, unspecified: Secondary | ICD-10-CM

## 2022-02-25 NOTE — Patient Instructions (Addendum)
Health Maintenance Due  Topic Date Due   COVID-19 Vaccine (5 - Pfizer risk series) 09/30/2020  Team please log date- she states had this year  The only thing you were due for today was a1c- we opted to wait and do that next visit with other labs  Thrilled you are doing so well other than the foot pain- glad you scheduled follow up with Dr. Carlis Abbott- you are so on top of it!   Recommended follow up: Return in about 6 months (around 08/26/2022) for followup or sooner if needed.Schedule b4 you leave.

## 2022-02-25 NOTE — Progress Notes (Signed)
Phone [phone] In person visit   Subjective:   [name] [name] is a 88 y.o. year old very pleasant female patient who presents for/with See problem oriented charting Chief Complaint  Patient presents with   Follow-up   Hyperlipidemia   Hypertension   Past Medical History-  Patient Active Problem List   Diagnosis Date Noted   Chronic diastolic heart failure ([place]) 12/30/2020    Priority: High   Status post femoral-popliteal bypass surgery 12/09/2019    Priority: High   Persistent atrial fibrillation ([place]) 12/03/2019    Priority: High   Pain in right ankle and joints of right foot 08/28/2014    Priority: High   Peripheral arterial disease ([place]) 06/29/2007    Priority: High   Left groin hernia 05/22/2020    Priority: Medium    CKD (chronic kidney disease), stage III ([place]) 08/07/2017    Priority: Medium    Acute gout of left foot 03/22/2016    Priority: Medium    Trigeminal neuralgia     Priority: Medium    Chronic low back pain 09/13/2010    Priority: Medium    COPD mixed type ([place]) 01/22/2010    Priority: Medium    Insulin resistance 01/06/2009    Priority: Medium    Hyperlipidemia 10/23/2006    Priority: Medium    Essential hypertension 10/23/2006    Priority: Medium    ESOPHAGEAL STRICTURE 08/09/1993    Priority: Medium    Secondary hypercoagulable state ([place]) 12/03/2019    Priority: Low   Syncope 12/29/2011    Priority: Low   Allergic rhinitis due to pollen 06/25/2010    Priority: Low   Solitary pulmonary nodule 01/12/2010    Priority: Low   Osteopenia 01/06/2009    Priority: Low   Eczema 11/26/2007    Priority: Low   Chronic bilateral low back pain with right-sided sciatica 08/29/2019    Priority: 1.   Unilateral primary osteoarthritis, right knee 04/07/2016    Priority: 1.   Mild sleep apnea 05/03/2021   Anxiety state    Rectal bleeding    Ischemic colitis ([place])    Gross hematuria 06/01/2018    Medications- reviewed and updated Current  Outpatient Medications  Medication Sig Dispense Refill   acetaminophen (TYLENOL) 325 MG tablet Take 1-2 tablets (325-650 mg total) by mouth every 4 (four) hours as needed for mild pain (or temp >/= 101 F).     apixaban (ELIQUIS) 5 MG TABS tablet Take 1 tablet by mouth twice daily 180 tablet 1   atorvastatin (LIPITOR) 40 MG tablet Take 1 tablet by mouth once daily 90 tablet 0   baclofen (LIORESAL) 10 MG tablet TAKE 1/2 (ONE-HALF) TABLET BY MOUTH NIGHTLY 30 tablet 3   famotidine (PEPCID) 20 MG tablet Take '20mg'$  in the evening for 1 week. Then take as needed for indigestion. 30 tablet 2   furosemide (LASIX) 40 MG tablet Take 1 tablet (40 mg total) by mouth daily. 30 tablet 11   ipratropium (ATROVENT) 0.06 % nasal spray Place 2 sprays into the nose 2 (two) times daily. 15 mL 11   lidocaine (LIDODERM) 5 % Place 1 patch onto the skin daily. Remove & Discard patch within 12 hours or as directed by MD 90 patch 3   lisinopril (ZESTRIL) 5 MG tablet Take 1 tablet (5 mg total) by mouth daily. 90 tablet 3   metoprolol succinate (TOPROL-XL) 100 MG 24 hr tablet Take 1 tablet (100 mg total) by mouth daily. Take with or  immediately following a meal. 90 tablet 3   Multiple Vitamin (MULTIVITAMIN WITH MINERALS) TABS tablet Take 1 tablet by mouth daily.     pantoprazole (PROTONIX) 40 MG tablet Take one tablet twice daily as directed 60 tablet 11   RESTASIS 0.05 % ophthalmic emulsion Place 1 drop into both eyes 2 (two) times daily as needed (dry eyes).   99   No current facility-administered medications for this visit.     Objective:  BP 104/60   Pulse 61   Temp 97.7 F (36.5 C)   Ht '4\' 11"'$  (1.499 m)   Wt 164 lb 12.8 oz (74.8 kg)   SpO2 95%   BMI 33.29 kg/m  Gen: NAD, resting comfortably CV:  irregularly irregular Lungs: CTAB no crackles, wheeze, rhonchi Ext: trace edema Skin: warm, dry    Assessment and Plan   #Working with PT through pain management to help with balance/gait/pain- helpful - she ist  still using her cane.   #PAD-actually on warfarin for arterial thrombosis in the past now on Eliquis for A. fib as well. Dr. Oneida Alar (now Dr. Carlis Abbott) ok with compression stockings #Hyperlipidemia  S: Compliant with atorvastatin 40 mg. Eliquis 5 mg BID.  LDL goal 70 or less due to arterial disease. -sees Dr. Carlis Abbott soon- has had some foot pain with walking. Does not hurt at rest Lab Results  Component Value Date   CHOL 129 02/24/2022   HDL 53 02/24/2022   LDLCALC 53 02/24/2022   LDLDIRECT 46.0 06/10/2019   TRIG 134 02/24/2022   CHOLHDL 2.4 02/24/2022  A/P: PAD- sounds like possible claudication but interestingly in feet-  I'm glad she has scheduled with Dr. Carlis Abbott. Continue statin and eliquis for now and glad has follow up  Lipids well controlled - continue current medicine  # Atrial fibrillation S: Rate controlled with metoprolol 100 mg extended release Anticoagulated with Eliquis 5 mg twice daily- reevaluate dose if cr raises above 1.5   A/P: appropriately anticoagulated and rate controlled- continue current meds   #Diastolic CHF- sees Dr. Oval Linsey #Essential hypertension S: medication: Lisinopril 5 mg, metoprolol 100 mg XR and lasix 40 mg daily  Edema: stable  Weight gain: stable  BP Readings from Last 3 Encounters:  02/25/22 104/60  02/22/22 124/64  02/14/22 114/60  A/P: CHF euvolemic- continue current medicine   HTN- Controlled. Continue current medications.  No lightheadedness with lower BP  #Chronic kidney disease stage III S: Patient's GFR has been stable in the 30s in 2023.  She knows to avoid NSAIDs- tylenol only A/P:Controlled/stable on check yesterday. Continue current medications.    #Insulin resistance/obesity/prediabetes- peak a1c 6.2 in 2021 S:  still doing her foot pedal exercises- some pain in legs with these Lab Results  Component Value Date   HGBA1C 6.1 08/20/2020   HGBA1C 5.6 02/19/2020   HGBA1C 6.2 06/10/2019  A/P: she wants to hold off on a1c for now as  not due for other labs   Recommended follow up: Return in about 6 months (around 08/26/2022) for followup or sooner if needed.Schedule b4 you leave. Future Appointments  Date Time Provider [place]  03/01/2022  9:30 AM [facility] 4 - SS [facility]  03/01/2022 10:30 AM [facility] 4 - SS [facility]  03/01/2022 11:00 AM Marty Heck, MD [facility]  03/02/2022 11:00 AM Janene Harvey D, PT [facility]  04/26/2022 11:00 AM Raulkar, Clide Deutscher, MD [facility] CPR  06/30/2022 11:00 AM Raulkar, Clide Deutscher, MD [facility] CPR  07/12/2022  3:30 PM [facility] CCM PHARMACIST [facility] P[facility]  02/10/2023  1:00 PM [facility] HEALTH COACH [facility]   Lab/Order associations:   ICD-10-CM   1. Essential hypertension  I10     2. Pure hypercholesterolemia  E78.00     3. Insulin resistance  E88.819     4. Peripheral arterial disease (HCC)  I73.9       No orders of the defined types were placed in this encounter.   Return precautions advised.  [name], MD

## 2022-02-28 ENCOUNTER — Telehealth (HOSPITAL_BASED_OUTPATIENT_CLINIC_OR_DEPARTMENT_OTHER): Payer: Self-pay

## 2022-02-28 NOTE — Telephone Encounter (Addendum)
Called results to patient and left results on VM (ok per DPR), instructions left to call office back if patient has any questions!     ----- Message from Loel Dubonnet, NP sent at 02/28/2022  4:00 PM EST ----- Cholesterol panel shows all numbers at goal.  Normal liver enzymes, electrolytes.  Kidney function stable compared to previous.

## 2022-02-28 NOTE — Telephone Encounter (Signed)
Results called to patient who verbalizes understanding!

## 2022-03-01 ENCOUNTER — Ambulatory Visit (HOSPITAL_COMMUNITY)
Admission: RE | Admit: 2022-03-01 | Discharge: 2022-03-01 | Disposition: A | Discharge status: Home / Self Care | Payer: Medicare Other | Source: Ambulatory Visit | Attending: Vascular Surgery | Admitting: Vascular Surgery

## 2022-03-01 ENCOUNTER — Encounter: Payer: Self-pay | Admitting: Vascular Surgery

## 2022-03-01 ENCOUNTER — Ambulatory Visit (INDEPENDENT_AMBULATORY_CARE_PROVIDER_SITE_OTHER): Payer: Medicare Other | Admitting: Vascular Surgery

## 2022-03-01 ENCOUNTER — Ambulatory Visit (INDEPENDENT_AMBULATORY_CARE_PROVIDER_SITE_OTHER)
Admission: RE | Admit: 2022-03-01 | Discharge: 2022-03-01 | Disposition: A | Discharge status: Home / Self Care | Payer: Medicare Other | Source: Ambulatory Visit | Attending: Vascular Surgery | Admitting: Vascular Surgery

## 2022-03-01 VITALS — BP 150/99 | HR 96 | Temp 97.9°F | Resp 20 | Ht 59.0 in | Wt 164.0 lb

## 2022-03-01 DIAGNOSIS — I739 Peripheral vascular disease, unspecified: Secondary | ICD-10-CM

## 2022-03-01 NOTE — Progress Notes (Signed)
Patient name: [name] [name] MRN: [id number] DOB: Feb 26, 1925 Sex: female  REASON FOR VISIT: 1 year follow-up for surveillance of bilateral lower extremity bypasses/triage visit  HPI: [name] [name] is a 88 y.o. female with multiple medical problems as listed below that presents for triage visit and surveillance of her bilateral lower extremity bypasses.  She reports about 3 weeks ago having significant pain at both ankles.  This resolved after 3 days.  She was worried about her bypass grafts.  Patient was previously under the care of Dr. Oneida Alar who retired from our practice.  Most recently she underwent a redo right common femoral to below-knee popliteal bypass with PTFE and a left common femoral endarterectomy and bovine patch on 12/09/2019.    Prior to this she had a left common femoral to above-knee popliteal bypass in 2006 by [name].  She had a right common femoral to above-knee popliteal bypass also in 2006 by [name] it was extended below the knee in 2010.  Past Medical History:  Diagnosis Date   Acute on chronic diastolic heart failure ([place]) 12/30/2020   Allergic rhinoconjunctivitis    Allergy    Anxiety    pt. managed- uses deep breathing    Arthritis    low back , stenosis   COLONIC POLYPS, RECURRENT 08/29/2006   2008 last colonoscopy. No further colonoscopy.      COPD (chronic obstructive pulmonary disease) (HCC)    Diverticulosis    Dysrhythmia    afib   Eczema    Environmental allergies    allergy shot- q friday in Dr. Janee Morn office. PFT's abnormal- recommended Spiriva to use preop & will d/c after surgery   GERD (gastroesophageal reflux disease)    Hiatal hernia    Hyperlipidemia    Hypertension    Lung nodule 2011   Paroxysmal atrial fibrillation ([place]) 11/27/2019   Peripheral vascular disease (HCC)    Shingles    Stenosis of popliteal artery (HCC)    blood clots in legs long ago     Trigeminal neuralgia    Left buttocks    Past Surgical History:   Procedure Laterality Date   ABDOMINAL AORTAGRAM N/A 06/17/2011   Procedure: ABDOMINAL Maxcine Ham;  Surgeon: Elam Dutch, MD;  Location[facility] CATH LAB;  Service: Cardiovascular;  Laterality: N/A;   ABDOMINAL AORTOGRAM W/LOWER EXTREMITY Bilateral 06/22/2018   Procedure: ABDOMINAL AORTOGRAM W/LOWER EXTREMITY;  Surgeon: Elam Dutch, MD;  Location: [place] CV LAB;  Service: Cardiovascular;  Laterality: Bilateral;   ABDOMINAL AORTOGRAM W/LOWER EXTREMITY Bilateral 11/29/2019   Procedure: ABDOMINAL AORTOGRAM W/LOWER EXTREMITY;  Surgeon: Elam Dutch, MD;  Location: [place] CV LAB;  Service: Cardiovascular;  Laterality: Bilateral;   CATARACT EXTRACTION, BILATERAL     w IOL   CHOLECYSTECTOMY  1998   COLONOSCOPY     ENDARTERECTOMY FEMORAL Left 12/09/2019   Procedure: ENDARTERECTOMY FEMORAL with bovine patch angioplasty.;  Surgeon: Elam Dutch, MD;  Location: [place];  Service: Vascular;  Laterality: Left;   FEMORAL-POPLITEAL BYPASS GRAFT        x2 surgeries 1990's & 2009   FEMORAL-POPLITEAL BYPASS GRAFT Right 12/09/2019   Procedure: REDO RIGHT FEMORAL-POPLITEAL ARTERY BYPASS GRAFT;  Surgeon: Elam Dutch, MD;  Location: [place];  Service: Vascular;  Laterality: Right;   INJECTION KNEE Right Aug. 2016   Gel injection for pain   LUMBAR FUSION  07/06/2011   TONSILLECTOMY     as a teenager    TUBAL LIGATION  Family History  Problem Relation Age of Onset   Arthritis Mother    Hypertension Mother    Heart disease Father    Colon polyps Father    Breast cancer Sister    Breast cancer Daughter    Hypertension Son    Lung cancer Brother    Colon cancer Sister    Brain cancer Sister    Lung cancer Sister    Anesthesia problems Neg Hx    Hypotension Neg Hx    Malignant hyperthermia Neg Hx    Pseudochol deficiency Neg Hx     SOCIAL HISTORY: Social History   Tobacco Use   Smoking status: Former    Packs/day: 2.00    Years: 30.00    Total pack years: 60.00     Types: Cigarettes    Quit date: 06/18/1993    Years since quitting: 28.7   Smokeless tobacco: Never   Tobacco comments:    [place]  Substance Use Topics   Alcohol use: No    Alcohol/week: 0.0 standard drinks of alcohol    Allergies  Allergen Reactions   Sulfa Antibiotics Other (See Comments)    Cold sweat light headed and disorientation   Tiotropium Bromide Shortness Of Breath and Other (See Comments)    Sore throat also   Latex Itching and Rash   Ultram [Tramadol] Nausea And Vomiting   5-Alpha Reductase Inhibitors     Current Outpatient Medications  Medication Sig Dispense Refill   acetaminophen (TYLENOL) 325 MG tablet Take 1-2 tablets (325-650 mg total) by mouth every 4 (four) hours as needed for mild pain (or temp >/= 101 F).     apixaban (ELIQUIS) 5 MG TABS tablet Take 1 tablet by mouth twice daily 180 tablet 1   atorvastatin (LIPITOR) 40 MG tablet Take 1 tablet by mouth once daily 90 tablet 0   baclofen (LIORESAL) 10 MG tablet TAKE 1/2 (ONE-HALF) TABLET BY MOUTH NIGHTLY 30 tablet 3   famotidine (PEPCID) 20 MG tablet Take '20mg'$  in the evening for 1 week. Then take as needed for indigestion. 30 tablet 2   furosemide (LASIX) 40 MG tablet Take 1 tablet (40 mg total) by mouth daily. 30 tablet 11   ipratropium (ATROVENT) 0.06 % nasal spray Place 2 sprays into the nose 2 (two) times daily. 15 mL 11   lidocaine (LIDODERM) 5 % Place 1 patch onto the skin daily. Remove & Discard patch within 12 hours or as directed by MD 90 patch 3   lisinopril (ZESTRIL) 5 MG tablet Take 1 tablet (5 mg total) by mouth daily. 90 tablet 3   metoprolol succinate (TOPROL-XL) 100 MG 24 hr tablet Take 1 tablet (100 mg total) by mouth daily. Take with or immediately following a meal. 90 tablet 3   Multiple Vitamin (MULTIVITAMIN WITH MINERALS) TABS tablet Take 1 tablet by mouth daily.     pantoprazole (PROTONIX) 40 MG tablet Take one tablet twice daily as directed 60 tablet 11   RESTASIS 0.05 % ophthalmic  emulsion Place 1 drop into both eyes 2 (two) times daily as needed (dry eyes).   99   No current facility-administered medications for this visit.    REVIEW OF SYSTEMS:  '[X]'$  denotes positive finding, '[ ]'$  denotes negative finding Cardiac  Comments:  Chest pain or chest pressure:    Shortness of breath upon exertion:    Short of breath when lying flat:    Irregular heart rhythm:        Vascular  Pain in calf, thigh, or hip brought on by ambulation:    Pain in feet at night that wakes you up from your sleep:     Blood clot in your veins:    Leg swelling:         Pulmonary    Oxygen at home:    Productive cough:     Wheezing:         Neurologic    Sudden weakness in arms or legs:     Sudden numbness in arms or legs:     Sudden onset of difficulty speaking or slurred speech:    Temporary loss of vision in one eye:     Problems with dizziness:         Gastrointestinal    Blood in stool:     Vomited blood:         Genitourinary    Burning when urinating:     Blood in urine:        Psychiatric    Major depression:         Hematologic    Bleeding problems:    Problems with blood clotting too easily:        Skin    Rashes or ulcers:        Constitutional    Fever or chills:      PHYSICAL EXAM: Vitals:   03/01/22 1030  BP: (!) 150/99  Pulse: 96  Resp: 20  Temp: 97.9 F (36.6 C)  SpO2: 96%  Weight: 164 lb (74.4 kg)  Height: '4\' 11"'$  (1.499 m)    GENERAL: The patient is a well-nourished female, in no acute distress. The vital signs are documented above. CARDIAC: There is a regular rate and rhythm.  VASCULAR:  Bilateral femoral pulses palpable Bilateral DP PT signals brisk by Doppler with no lower extremity tissue loss PULMONARY: No respiratory distress ABDOMEN: Soft and non-tender. MUSCULOSKELETAL: There are no major deformities or cyanosis. NEUROLOGIC: No focal weakness or paresthesias are detected. SKIN: There are no ulcers or rashes  noted. PSYCHIATRIC: The patient has a normal affect.  DATA:   Right ABI 1.20 with monophasic waveform and left ABI 1.26 biphasic   Bilateral lower extremity bypasses are patent with no stenosis visualized  Assessment/Plan:  88 year old female presents for triage visit and surveillance of her bilateral lower extremity bypass grafts.  Most recently she underwent a redo right common femoral to below-knee popliteal bypass with PTFE and also a left common femoral endarterectomy with bovine patch by Dr. Oneida Alar on 12/09/2019 (this is in the setting of a previous left common femoral to above-knee popliteal bypass by [name]).  Discussed both of her bypass grafts are patent with no stenosis visualized on duplex.  She has brisk Doppler signals on exam.  Fortunately her ankle pain resolved about 3 weeks ago and I discussed I do not think this is related to arterial insufficiency.  We will plan next follow-up in 1 year with bilateral lower extremity arterial duplexes and ABIs for ongoing surveillance.   Marty Heck, MD [facility] Office: [phone]

## 2022-03-01 NOTE — Therapy (Signed)
OUTPATIENT PHYSICAL THERAPY RE-CERTIFICATION   Patient Name: [name] [name] MRN: [id number] DOB:01/20/1925, 88 y.o., female Today's Date: 03/01/2022  PCP:  Marin Olp, MD REFERRING PROVIDER: Izora Ribas, MD      END OF SESSION:            Past Medical History:  Diagnosis Date   Acute on chronic diastolic heart failure ([place]) 12/30/2020   Allergic rhinoconjunctivitis    Allergy    Anxiety    pt. managed- uses deep breathing    Arthritis    low back , stenosis   COLONIC POLYPS, RECURRENT 08/29/2006   2008 last colonoscopy. No further colonoscopy.      COPD (chronic obstructive pulmonary disease) (HCC)    Diverticulosis    Dysrhythmia    afib   Eczema    Environmental allergies    allergy shot- q friday in Dr. Janee Morn office. PFT's abnormal- recommended Spiriva to use preop & will d/c after surgery   GERD (gastroesophageal reflux disease)    Hiatal hernia    Hyperlipidemia    Hypertension    Lung nodule 2011   Paroxysmal atrial fibrillation ([place]) 11/27/2019   Peripheral vascular disease (HCC)    Shingles    Stenosis of popliteal artery (HCC)    blood clots in legs long ago     Trigeminal neuralgia    Left buttocks   Past Surgical History:  Procedure Laterality Date   ABDOMINAL AORTAGRAM N/A 06/17/2011   Procedure: ABDOMINAL Maxcine Ham;  Surgeon: Elam Dutch, MD;  Location: [facility] CATH LAB;  Service: Cardiovascular;  Laterality: N/A;   ABDOMINAL AORTOGRAM W/LOWER EXTREMITY Bilateral 06/22/2018   Procedure: ABDOMINAL AORTOGRAM W/LOWER EXTREMITY;  Surgeon: Elam Dutch, MD;  Location: [place] CV LAB;  Service: Cardiovascular;  Laterality: Bilateral;   ABDOMINAL AORTOGRAM W/LOWER EXTREMITY Bilateral 11/29/2019   Procedure: ABDOMINAL AORTOGRAM W/LOWER EXTREMITY;  Surgeon: Elam Dutch, MD;  Location: [place] CV LAB;  Service: Cardiovascular;  Laterality: Bilateral;   CATARACT EXTRACTION, BILATERAL     w IOL   CHOLECYSTECTOMY  1998    COLONOSCOPY     ENDARTERECTOMY FEMORAL Left 12/09/2019   Procedure: ENDARTERECTOMY FEMORAL with bovine patch angioplasty.;  Surgeon: Elam Dutch, MD;  Location: [place];  Service: Vascular;  Laterality: Left;   FEMORAL-POPLITEAL BYPASS GRAFT        x2 surgeries 1990's & 2009   FEMORAL-POPLITEAL BYPASS GRAFT Right 12/09/2019   Procedure: REDO RIGHT FEMORAL-POPLITEAL ARTERY BYPASS GRAFT;  Surgeon: Elam Dutch, MD;  Location: [facility] OR;  Service: Vascular;  Laterality: Right;   INJECTION KNEE Right Aug. 2016   Gel injection for pain   LUMBAR FUSION  07/06/2011   TONSILLECTOMY     as a teenager    TUBAL LIGATION     Patient Active Problem List   Diagnosis Date Noted   Mild sleep apnea 05/03/2021   Chronic diastolic heart failure ([place]) 12/30/2020   Left groin hernia 05/22/2020   Anxiety state    Rectal bleeding    Ischemic colitis ([place])    Status post femoral-popliteal bypass surgery 12/09/2019   Persistent atrial fibrillation ([place]) 12/03/2019   Secondary hypercoagulable state ([place]) 12/03/2019   Chronic bilateral low back pain with right-sided sciatica 08/29/2019   Gross hematuria 06/01/2018   CKD (chronic kidney disease), stage III ([place]) 08/07/2017   Unilateral primary osteoarthritis, right knee 04/07/2016   Acute gout of left foot 03/22/2016   Pain in right ankle and joints of right foot  08/28/2014   Trigeminal neuralgia    Syncope 12/29/2011   Chronic low back pain 09/13/2010   Allergic rhinitis due to pollen 06/25/2010   COPD mixed type ([place]) 01/22/2010   Solitary pulmonary nodule 01/12/2010   Insulin resistance 01/06/2009   Osteopenia 01/06/2009   Eczema 11/26/2007   Peripheral arterial disease ([place]) 06/29/2007   Hyperlipidemia 10/23/2006   Essential hypertension 10/23/2006   ESOPHAGEAL STRICTURE 08/09/1993    REFERRING DIAG: M79.10 (ICD-10-CM) - Myalgia   THERAPY DIAG:  No diagnosis found.  Rationale for Evaluation and Treatment Rehabilitation  PERTINENT  HISTORY:  hx of lumbar fusion  PRECAUTIONS: None  SUBJECTIVE: Claiborne Billings says I'm supposed to be on my walker but it's so windy outside. Had some trigger point injections in LB and it is feeling better. Backs of the legs are more tired today.   PAIN:  Are you having pain? No   OBJECTIVE:     TODAY'S TREATMENT: 03/02/22 Activity Comments  Trunk flexion MMT   Floor transfer   [place] Last updated: 02/15/22 Access Code: [email or web address] URL: [email or web address] Date: 02/15/2022 Prepared by: [facility]  Exercises - Supine Posterior Pelvic Tilt  - 1 x daily - 7 x weekly - 1-3 sets - 10 reps - 3 sec hold - Hooklying Single Knee to Chest Stretch  - 1 x daily - 7 x weekly - 1 sets - 3 reps - 15-30 sec hold - Supine Lower Trunk Rotation  - 1 x daily - 7 x weekly - 3 sets - 10 reps - 15-30 sec hold - Sidelying Thoracic Lumbar Rotation  - 1 x daily - 7 x weekly - 1 sets - 3 reps - 15-30 sec hold - Staggered Stance Forward Backward Weight Shift with Counter Support  - 1-2 x daily - 7 x weekly - 1-2 sets - 10 reps - Hooklying Single Leg Bent Knee Fallouts with Resistance  - 1 x daily - 5 x weekly - 2 sets - 10 reps - Supine Bridge  - 1 x daily - 5 x weekly - 2 sets - 10 reps - Standing Balance in Corner with Eyes Closed  - 1 x daily - 5 x weekly - 2 sets - 30 sec hold - Standing Toe Taps  - 1 x daily - 5 x weekly - 2 sets - 10 reps     ------------------------------------------------------------------------------- (objective measures completed at initial evaluation unless otherwise dated)   DIAGNOSTIC FINDINGS:  IMPRESSION: 1. L4-5 PLIF with resection of previously seen disc material. No residual spinal canal stenosis. 2. Unchanged moderate bilateral neural foraminal stenosis at L2-3 and L3-4. 3. Progression of severe right T11-12 and T12-L1 neural foraminal stenosis.   COGNITION: Overall  cognitive status: Within functional limits for tasks assessed             SENSATION: WFL, denies parasthesias   COORDINATION: WNL   EDEMA:      MUSCLE TONE: WNL   Palpation:  lower thoracic/high lumbar paraspinals very tight and tender to palpation, decrease in lumbar lordosis notable     MUSCLE LENGTH: Left hamstrings tight and restricted vs right. Left SLR 35 degrees vs RLE SLR of 50-60 deg   DTRs:  DNT   POSTURE: No Significant postural limitations Side-sleeper, typically on the left      LOWER EXTREMITY ROM:    WFL   Lumbar  ROM: flexion WFL                         Extension grossly limited                         Sidebending symmetrical 10% limited   LOWER EXTREMITY MMT:     MMT Right Eval Left Eval  Hip flexion 4 4  Hip extension      Hip abduction 4 4  Hip adduction      Hip internal rotation      Hip external rotation      Knee flexion 5 5  Knee extension 5 5  Ankle dorsiflexion 5 5  Ankle plantarflexion      Ankle inversion      Ankle eversion      (Blank rows = not tested)   Trunk flexion: 2+/5     BED MOBILITY:  Modified independent, increased time   TRANSFERS: Assistive device utilized: None  Sit to stand: Complete Independence Stand to sit: Complete Independence Chair to chair: Complete Independence Floor:  DNT       CURB:  Level of Assistance: Modified independence Assistive device utilized: Single point cane Curb Comments:        GAIT: Gait pattern: WFL Distance walked: pt report she is able to walk grocery store distances using shopping cart for support Assistive device utilized: Single point cane and None Level of assistance: Complete Independence and Modified independence Comments: reports use of cane for community distances, no AD for household use   FUNCTIONAL TESTs:  6 minute walk test: NT Berg Balance Scale: NT   PATIENT SURVEYS:      TODAY'S TREATMENT:  Evaluation, education, HEP initiation     PATIENT  EDUCATION: Education details: use of anti-fatigue mats, support pillow for sleeping (pregnancy pillow), assessment findings Person educated: Patient Education method: Explanation and Handouts Education comprehension: needs further education     HOME EXERCISE PROGRAM: Access Code: [email or web address] URL: [email or web address] Date: 01/05/2022 Prepared by: Sherlyn Lees   Exercises - Supine Posterior Pelvic Tilt  - 1 x daily - 7 x weekly - 1-3 sets - 10 reps - 3 sec hold       GOALS: Goals reviewed with patient? Yes   SHORT TERM GOALS: Target date: 01/19/2022   Patient will be independent in HEP to improve functional outcomes with emphasis on core strength and hamstring flexibility Baseline: Goal status: MET for current HEP 01/31/22       LONG TERM GOALS: Target date: 02/28/2022   Pt to report successful positions/strategies for sleeping posture to improve quality of sleep and reduce back pain Baseline: Reports occasional LBP at night- reports that laying on either side is the most comfortable.  Goal status: MET 01/31/22   2.  Demo trunk flexion strength 3-/5 for improve stability Baseline: 2+/5; NT 01/31/22 Goal status: IN PROGRESS   3.  Report improved standing tolerance with use of anti-fatigue mat or other adaptations x 20 min to improve efficiency of housekeeping tasks Baseline: 5-10 min; reports 2-3 minutes 01/31/22 Goal status: IN PROGRESS 01/31/22   4.  Pt to report/demonstrate 3 strategies for self-management of chronic low back pain to improve comfort and QOL.  Baseline: main strategy at present is seated rest period; 10/26:  standing rocking, standing stretches Goal status: MET 01/31/22  5.  Pt to demonstrate safe floor transfer with mod I.   Baseline: required min A to  get down to floor, mod A and heavy verbal-tactile cueing to stand up 01/31/22 Goal status: IN PROGRESS 01/31/22  6.  Pt to score at least 46/56 on Berg to indicate decreased risk of falls.     Baseline: 44/56 01/31/22 Goal status: IN PROGRESS 01/31/22     ASSESSMENT:   CLINICAL IMPRESSION: Patient arrived to session with report of getting trigger point injections in the LB yesterday and improved pain levels as a result. Did report increased LE fatigue today, which was addressed with stretching. Worked on standing balance activities with reduction in UE support to improve balance confidence. Dynamic balance activities required cueing for wider BOS which improved stability. Patient performed LE strengthening with cueing for improved form with good effort and carryover. Patient tolerated session well with short intermittent sit breaks d/t LE fatigue. No complaints at end of session.    OBJECTIVE IMPAIRMENTS decreased activity tolerance, decreased endurance, difficulty walking, decreased strength, impaired perceived functional ability, increased muscle spasms, impaired flexibility, improper body mechanics, postural dysfunction, and pain.    ACTIVITY LIMITATIONS carrying, standing, squatting, sleeping, bed mobility, and locomotion level   PARTICIPATION LIMITATIONS: meal prep, cleaning, laundry, and community activity   PERSONAL FACTORS Age, Time since onset of injury/illness/exacerbation, and 1 comorbidity: hx of lumbar fusion  are also affecting patient's functional outcome.    REHAB POTENTIAL: Good   CLINICAL DECISION MAKING: Stable/uncomplicated   EVALUATION COMPLEXITY: Low   PLAN: PT FREQUENCY: 12x/week   PT DURATION: 4 weeks   PLANNED INTERVENTIONS: Therapeutic exercises, Therapeutic activity, Neuromuscular re-education, Balance training, Gait training, Patient/Family education, Self Care, Joint mobilization, Stair training, Vestibular training, Canalith repositioning, Orthotic/Fit training, DME instructions, Aquatic Therapy, Electrical stimulation, Spinal mobilization, Cryotherapy, Moist heat, Taping, Traction, Ionotophoresis 65m/ml Dexamethasone, and Manual therapy    PLAN FOR NEXT SESSION: floor transfers; Continue practice balance activities with EC and SLS/stepping activities and update into HEP as appropriate; work on components of floor transfers.  Work towards [place] are due 02/28/2022    YJanene Harvey PT, DPT 03/01/22 8:01 AM  [facility] [place] [address]Phone # [phone] # [phone]

## 2022-03-02 ENCOUNTER — Encounter: Payer: Self-pay | Admitting: Physical Therapy

## 2022-03-02 ENCOUNTER — Ambulatory Visit: Payer: Medicare Other | Admitting: Physical Therapy

## 2022-03-02 DIAGNOSIS — R252 Cramp and spasm: Secondary | ICD-10-CM | POA: Diagnosis not present

## 2022-03-02 DIAGNOSIS — G8929 Other chronic pain: Secondary | ICD-10-CM | POA: Diagnosis not present

## 2022-03-02 DIAGNOSIS — M545 Low back pain, unspecified: Secondary | ICD-10-CM | POA: Diagnosis not present

## 2022-03-02 DIAGNOSIS — R2689 Other abnormalities of gait and mobility: Secondary | ICD-10-CM | POA: Diagnosis not present

## 2022-03-02 DIAGNOSIS — M6281 Muscle weakness (generalized): Secondary | ICD-10-CM

## 2022-03-02 DIAGNOSIS — R2681 Unsteadiness on feet: Secondary | ICD-10-CM | POA: Diagnosis not present

## 2022-03-07 ENCOUNTER — Other Ambulatory Visit: Payer: Self-pay | Admitting: Allergy and Immunology

## 2022-03-13 ENCOUNTER — Other Ambulatory Visit: Payer: Self-pay | Admitting: Allergy and Immunology

## 2022-03-13 ENCOUNTER — Other Ambulatory Visit: Payer: Self-pay | Admitting: Family Medicine

## 2022-03-14 ENCOUNTER — Telehealth: Payer: Self-pay | Admitting: Pharmacist

## 2022-03-14 NOTE — Progress Notes (Unsigned)
    Chronic Care Management Pharmacy Assistant   Name: [name] [name]  MRN: [id number] DOB: [birth year]   Reason for Encounter: CHF Adherence Call    Recent office visits:  02/25/2022 OV (PCP) Marin Olp, MD; no medication changes indicated.  Recent consult visits:  03/01/2022 OV (Vasc Surg) Marty Heck, MD; no medication changes indicated.  02/22/2022 OV (Phys Med) Adam Phenix, Clide Deutscher, MD; no medication changes indicated.  02/14/2022 OV (Cardiology) Skeet Latch, MD; no medication changes indicated.  02/08/2022 OV (Orthopedics) Leandrew Koyanagi, MD; no medication changes indicated.  Hospital visits:  None in previous 6 months  Medications: Outpatient Encounter Medications as of 03/14/2022  Medication Sig   acetaminophen (TYLENOL) 325 MG tablet Take 1-2 tablets (325-650 mg total) by mouth every 4 (four) hours as needed for mild pain (or temp >/= 101 F).   apixaban (ELIQUIS) 5 MG TABS tablet Take 1 tablet by mouth twice daily   atorvastatin (LIPITOR) 40 MG tablet Take 1 tablet by mouth once daily   baclofen (LIORESAL) 10 MG tablet TAKE 1/2 (ONE-HALF) TABLET BY MOUTH NIGHTLY   famotidine (PEPCID) 20 MG tablet Take '20mg'$  in the evening for 1 week. Then take as needed for indigestion.   furosemide (LASIX) 40 MG tablet Take 1 tablet (40 mg total) by mouth daily.   ipratropium (ATROVENT) 0.06 % nasal spray USE 2 SPRAY(S) IN EACH NOSTRIL TWICE DAILY   lidocaine (LIDODERM) 5 % Place 1 patch onto the skin daily. Remove & Discard patch within 12 hours or as directed by MD   lisinopril (ZESTRIL) 5 MG tablet Take 1 tablet (5 mg total) by mouth daily.   metoprolol succinate (TOPROL-XL) 100 MG 24 hr tablet Take 1 tablet (100 mg total) by mouth daily. Take with or immediately following a meal.   Multiple Vitamin (MULTIVITAMIN WITH MINERALS) TABS tablet Take 1 tablet by mouth daily.   pantoprazole (PROTONIX) 40 MG tablet TAKE 1 TABLET BY MOUTH TWICE DAILY AS DIRECTED    RESTASIS 0.05 % ophthalmic emulsion Place 1 drop into both eyes 2 (two) times daily as needed (dry eyes).    No facility-administered encounter medications on file as of 03/14/2022.   Have you had any of the following symptoms?  Feeling dizzy or lightheaded Weight gain of three or more pounds in one day Weight gain of five pounds in one week Unusual swelling in the legs, feet, hands, or abdomen A persistent cough or chest congestion (the cough may be dry or hacking) Increasing fatigue or a sudden decrease in your ability to do normal activities A loss of appetite or nausea A feeling of fullness or bloating in your stomach Confusion or restlessness  Care Gaps: Medicare Annual Wellness: Completed 02/04/2023 Hemoglobin A1C: 6.1% on 08/20/2020  Future Appointments  Date Time Provider [place]  04/26/2022 11:00 AM Raulkar, Clide Deutscher, MD [facility] CPR  06/30/2022 11:00 AM Raulkar, Clide Deutscher, MD [facility] CPR  07/12/2022  3:30 PM [facility] CCM PHARMACIST [facility]  08/26/2022 10:00 AM Marin Olp, MD [facility]  02/10/2023  1:00 PM [facility] HEALTH COACH [facility]   Star Rating Drugs: Atorvastatin 40 mg last filled 12/14/2021 90 DS Lisinopril 5 mg last filled 01/20/2022 90 DS  April D Calhoun, [place] Pharmacist Assistant [phone]

## 2022-04-08 ENCOUNTER — Other Ambulatory Visit: Payer: Self-pay | Admitting: Allergy and Immunology

## 2022-04-14 ENCOUNTER — Other Ambulatory Visit: Payer: Self-pay | Admitting: Allergy and Immunology

## 2022-04-15 ENCOUNTER — Other Ambulatory Visit: Payer: Self-pay | Admitting: Allergy and Immunology

## 2022-04-15 ENCOUNTER — Telehealth: Payer: Self-pay | Admitting: Family Medicine

## 2022-04-15 NOTE — Telephone Encounter (Signed)
Pt states she was taking pantoprazole (PROTONIX) 40 MG tablet when she was at another office but thinks she should not take it.   pantoprazole (PROTONIX) 40 MG tablet   Please advise and call pt back with details.

## 2022-04-19 NOTE — Telephone Encounter (Signed)
Ok for pt to continue taking?

## 2022-04-21 ENCOUNTER — Other Ambulatory Visit: Payer: Self-pay | Admitting: Allergy and Immunology

## 2022-04-21 ENCOUNTER — Telehealth: Payer: Self-pay | Admitting: Family Medicine

## 2022-04-21 MED ORDER — PANTOPRAZOLE SODIUM 40 MG PO TBEC: DELAYED_RELEASE_TABLET | ORAL | 0 refills | Status: DC

## 2022-04-21 NOTE — Telephone Encounter (Signed)
  Encourage patient to contact the pharmacy for refills or they can request refills through [facility]:  Please schedule appointment if longer than 1 year  NEXT APPOINTMENT DATE:08/26/22  MEDICATION:  pantoprazole (PROTONIX) 40 MG tablet    Is the patient out of medication? Yes  PHARMACY:  [facility], [place] Phone: [phone]  Fax: [phone]      Let patient know to contact pharmacy at the end of the day to make sure medication is ready.  Please notify patient to allow 48-72 hours to process   THIS MEDICATION WAS RX FROM ANOTHER OFFICE THAT SHE NO LONGER GOES TO AND NEEDS DR Yong Channel TO SEND RX

## 2022-04-21 NOTE — Telephone Encounter (Signed)
Rx sent 

## 2022-04-22 NOTE — Telephone Encounter (Signed)
Has required esophageal dilation in the past- I would rather wean instead of take her off- listed as twice a day- have her try once a day for now and schedule follow up in 1-2 months if she would like (or can keep may visit)

## 2022-04-22 NOTE — Telephone Encounter (Signed)
Lm for pt tcb. 

## 2022-04-26 ENCOUNTER — Encounter
Payer: Medicare Other | Attending: Physical Medicine and Rehabilitation | Admitting: Physical Medicine and Rehabilitation

## 2022-04-26 ENCOUNTER — Encounter: Payer: Self-pay | Admitting: Physical Medicine and Rehabilitation

## 2022-04-26 VITALS — BP 133/83 | HR 86 | Temp 97.8°F | Ht 59.0 in | Wt 166.0 lb

## 2022-04-26 DIAGNOSIS — M791 Myalgia, unspecified site: Secondary | ICD-10-CM | POA: Insufficient documentation

## 2022-04-26 MED ORDER — LIDOCAINE HCL 1 % IJ SOLN
5.0000 mL | Freq: Once | INTRAMUSCULAR | Status: AC
  Administered 2022-04-26: 5 mL via INTRADERMAL

## 2022-04-26 NOTE — Telephone Encounter (Signed)
Called and spoke with pt and below message given. 

## 2022-04-26 NOTE — Progress Notes (Signed)
Trigger Point Injection  Indication: Lumbar myofascial pain not relieved by medication management and other conservative care.  Informed consent was obtained after describing risk and benefits of the procedure with the patient, this includes bleeding, bruising, infection and medication side effects.  The patient wishes to proceed and has given written consent.  The patient was placed in a seated position.  The area of pain was marked and prepped with Betadine.  It was entered with a 25-gauge 1/2 inch needle and a total of 5 mL of 1% lidocaine and normal saline was injected into a total of 4 trigger points, after negative draw back for blood.  The patient tolerated the procedure well.  Post procedure instructions were given.     

## 2022-05-04 ENCOUNTER — Other Ambulatory Visit: Payer: Self-pay

## 2022-05-04 ENCOUNTER — Emergency Department (HOSPITAL_BASED_OUTPATIENT_CLINIC_OR_DEPARTMENT_OTHER): Payer: Medicare Other

## 2022-05-04 ENCOUNTER — Telehealth: Payer: Self-pay | Admitting: Family Medicine

## 2022-05-04 ENCOUNTER — Encounter (HOSPITAL_BASED_OUTPATIENT_CLINIC_OR_DEPARTMENT_OTHER): Payer: Self-pay

## 2022-05-04 ENCOUNTER — Encounter (HOSPITAL_COMMUNITY): Payer: Self-pay

## 2022-05-04 ENCOUNTER — Observation Stay (HOSPITAL_COMMUNITY): Payer: Medicare Other

## 2022-05-04 ENCOUNTER — Inpatient Hospital Stay (HOSPITAL_BASED_OUTPATIENT_CLINIC_OR_DEPARTMENT_OTHER)
Admission: EM | Admit: 2022-05-04 | Discharge: 2022-05-10 | DRG: 149 | Disposition: A | Discharge status: Skilled Nursing Facility | Payer: Medicare Other | Attending: Internal Medicine | Admitting: Internal Medicine

## 2022-05-04 DIAGNOSIS — Z885 Allergy status to narcotic agent status: Secondary | ICD-10-CM

## 2022-05-04 DIAGNOSIS — I130 Hypertensive heart and chronic kidney disease with heart failure and stage 1 through stage 4 chronic kidney disease, or unspecified chronic kidney disease: Secondary | ICD-10-CM | POA: Diagnosis present

## 2022-05-04 DIAGNOSIS — I951 Orthostatic hypotension: Secondary | ICD-10-CM | POA: Diagnosis not present

## 2022-05-04 DIAGNOSIS — I5032 Chronic diastolic (congestive) heart failure: Secondary | ICD-10-CM | POA: Diagnosis not present

## 2022-05-04 DIAGNOSIS — Z7901 Long term (current) use of anticoagulants: Secondary | ICD-10-CM

## 2022-05-04 DIAGNOSIS — J449 Chronic obstructive pulmonary disease, unspecified: Secondary | ICD-10-CM | POA: Diagnosis not present

## 2022-05-04 DIAGNOSIS — R2681 Unsteadiness on feet: Secondary | ICD-10-CM | POA: Diagnosis not present

## 2022-05-04 DIAGNOSIS — M25571 Pain in right ankle and joints of right foot: Secondary | ICD-10-CM | POA: Diagnosis not present

## 2022-05-04 DIAGNOSIS — Z8261 Family history of arthritis: Secondary | ICD-10-CM

## 2022-05-04 DIAGNOSIS — R55 Syncope and collapse: Secondary | ICD-10-CM | POA: Diagnosis not present

## 2022-05-04 DIAGNOSIS — E785 Hyperlipidemia, unspecified: Secondary | ICD-10-CM | POA: Diagnosis present

## 2022-05-04 DIAGNOSIS — Z79899 Other long term (current) drug therapy: Secondary | ICD-10-CM | POA: Diagnosis not present

## 2022-05-04 DIAGNOSIS — J301 Allergic rhinitis due to pollen: Secondary | ICD-10-CM | POA: Diagnosis not present

## 2022-05-04 DIAGNOSIS — M545 Low back pain, unspecified: Secondary | ICD-10-CM | POA: Diagnosis present

## 2022-05-04 DIAGNOSIS — Z87891 Personal history of nicotine dependence: Secondary | ICD-10-CM | POA: Diagnosis not present

## 2022-05-04 DIAGNOSIS — R42 Dizziness and giddiness: Principal | ICD-10-CM | POA: Diagnosis present

## 2022-05-04 DIAGNOSIS — Z83719 Family history of colon polyps, unspecified: Secondary | ICD-10-CM | POA: Diagnosis not present

## 2022-05-04 DIAGNOSIS — E7800 Pure hypercholesterolemia, unspecified: Secondary | ICD-10-CM

## 2022-05-04 DIAGNOSIS — K625 Hemorrhage of anus and rectum: Secondary | ICD-10-CM | POA: Diagnosis not present

## 2022-05-04 DIAGNOSIS — R2689 Other abnormalities of gait and mobility: Secondary | ICD-10-CM | POA: Diagnosis not present

## 2022-05-04 DIAGNOSIS — Z800 Family history of malignant neoplasm of digestive organs: Secondary | ICD-10-CM | POA: Diagnosis not present

## 2022-05-04 DIAGNOSIS — M6281 Muscle weakness (generalized): Secondary | ICD-10-CM | POA: Diagnosis not present

## 2022-05-04 DIAGNOSIS — R310 Gross hematuria: Secondary | ICD-10-CM | POA: Diagnosis not present

## 2022-05-04 DIAGNOSIS — D6869 Other thrombophilia: Secondary | ICD-10-CM | POA: Diagnosis present

## 2022-05-04 DIAGNOSIS — Z6833 Body mass index (BMI) 33.0-33.9, adult: Secondary | ICD-10-CM

## 2022-05-04 DIAGNOSIS — F411 Generalized anxiety disorder: Secondary | ICD-10-CM | POA: Diagnosis not present

## 2022-05-04 DIAGNOSIS — Z881 Allergy status to other antibiotic agents status: Secondary | ICD-10-CM

## 2022-05-04 DIAGNOSIS — R531 Weakness: Secondary | ICD-10-CM | POA: Diagnosis not present

## 2022-05-04 DIAGNOSIS — Z981 Arthrodesis status: Secondary | ICD-10-CM

## 2022-05-04 DIAGNOSIS — R278 Other lack of coordination: Secondary | ICD-10-CM | POA: Diagnosis not present

## 2022-05-04 DIAGNOSIS — N183 Chronic kidney disease, stage 3 unspecified: Secondary | ICD-10-CM | POA: Diagnosis present

## 2022-05-04 DIAGNOSIS — H9190 Unspecified hearing loss, unspecified ear: Secondary | ICD-10-CM | POA: Diagnosis present

## 2022-05-04 DIAGNOSIS — M5441 Lumbago with sciatica, right side: Secondary | ICD-10-CM | POA: Diagnosis not present

## 2022-05-04 DIAGNOSIS — R52 Pain, unspecified: Secondary | ICD-10-CM | POA: Diagnosis not present

## 2022-05-04 DIAGNOSIS — Z803 Family history of malignant neoplasm of breast: Secondary | ICD-10-CM

## 2022-05-04 DIAGNOSIS — Z95828 Presence of other vascular implants and grafts: Secondary | ICD-10-CM | POA: Diagnosis not present

## 2022-05-04 DIAGNOSIS — R112 Nausea with vomiting, unspecified: Secondary | ICD-10-CM | POA: Diagnosis present

## 2022-05-04 DIAGNOSIS — G4730 Sleep apnea, unspecified: Secondary | ICD-10-CM | POA: Diagnosis not present

## 2022-05-04 DIAGNOSIS — G8929 Other chronic pain: Secondary | ICD-10-CM | POA: Diagnosis present

## 2022-05-04 DIAGNOSIS — Z1152 Encounter for screening for COVID-19: Secondary | ICD-10-CM

## 2022-05-04 DIAGNOSIS — Z91040 Latex allergy status: Secondary | ICD-10-CM

## 2022-05-04 DIAGNOSIS — I4819 Other persistent atrial fibrillation: Secondary | ICD-10-CM

## 2022-05-04 DIAGNOSIS — I739 Peripheral vascular disease, unspecified: Secondary | ICD-10-CM | POA: Diagnosis present

## 2022-05-04 DIAGNOSIS — I4821 Permanent atrial fibrillation: Secondary | ICD-10-CM | POA: Diagnosis present

## 2022-05-04 DIAGNOSIS — E669 Obesity, unspecified: Secondary | ICD-10-CM | POA: Diagnosis present

## 2022-05-04 DIAGNOSIS — I10 Essential (primary) hypertension: Secondary | ICD-10-CM

## 2022-05-04 DIAGNOSIS — Z808 Family history of malignant neoplasm of other organs or systems: Secondary | ICD-10-CM

## 2022-05-04 DIAGNOSIS — Z888 Allergy status to other drugs, medicaments and biological substances status: Secondary | ICD-10-CM

## 2022-05-04 DIAGNOSIS — M109 Gout, unspecified: Secondary | ICD-10-CM | POA: Diagnosis not present

## 2022-05-04 DIAGNOSIS — K219 Gastro-esophageal reflux disease without esophagitis: Secondary | ICD-10-CM | POA: Diagnosis present

## 2022-05-04 DIAGNOSIS — K559 Vascular disorder of intestine, unspecified: Secondary | ICD-10-CM | POA: Diagnosis not present

## 2022-05-04 DIAGNOSIS — K4090 Unilateral inguinal hernia, without obstruction or gangrene, not specified as recurrent: Secondary | ICD-10-CM | POA: Diagnosis not present

## 2022-05-04 DIAGNOSIS — Z882 Allergy status to sulfonamides status: Secondary | ICD-10-CM

## 2022-05-04 DIAGNOSIS — Z9049 Acquired absence of other specified parts of digestive tract: Secondary | ICD-10-CM

## 2022-05-04 DIAGNOSIS — Z801 Family history of malignant neoplasm of trachea, bronchus and lung: Secondary | ICD-10-CM

## 2022-05-04 DIAGNOSIS — R110 Nausea: Secondary | ICD-10-CM | POA: Diagnosis not present

## 2022-05-04 DIAGNOSIS — Z8249 Family history of ischemic heart disease and other diseases of the circulatory system: Secondary | ICD-10-CM

## 2022-05-04 DIAGNOSIS — M1711 Unilateral primary osteoarthritis, right knee: Secondary | ICD-10-CM | POA: Diagnosis not present

## 2022-05-04 DIAGNOSIS — G500 Trigeminal neuralgia: Secondary | ICD-10-CM | POA: Diagnosis not present

## 2022-05-04 LAB — COMPREHENSIVE METABOLIC PANEL
ALT: 8 U/L (ref 0–44)
AST: 16 U/L (ref 15–41)
Albumin: 3.9 g/dL (ref 3.5–5.0)
Alkaline Phosphatase: 62 U/L (ref 38–126)
Anion gap: 11 (ref 5–15)
BUN: 21 mg/dL (ref 8–23)
CO2: 29 mmol/L (ref 22–32)
Calcium: 8.7 mg/dL — ABNORMAL LOW (ref 8.9–10.3)
Chloride: 102 mmol/L (ref 98–111)
Creatinine, Ser: 0.96 mg/dL (ref 0.44–1.00)
GFR, Estimated: 54 mL/min — ABNORMAL LOW (ref >60)
Glucose, Bld: 140 mg/dL — ABNORMAL HIGH (ref 70–99)
Potassium: 3.7 mmol/L (ref 3.5–5.1)
Sodium: 142 mmol/L (ref 135–145)
Total Bilirubin: 0.8 mg/dL (ref 0.3–1.2)
Total Protein: 6 g/dL — ABNORMAL LOW (ref 6.5–8.1)

## 2022-05-04 LAB — RESP PANEL BY RT-PCR (RSV, FLU A&B, COVID)  RVPGX2
Influenza A by PCR: NEGATIVE
Influenza B by PCR: NEGATIVE
Resp Syncytial Virus by PCR: NEGATIVE
SARS Coronavirus 2 by RT PCR: NEGATIVE

## 2022-05-04 LAB — CBC WITH DIFFERENTIAL/PLATELET
Abs Immature Granulocytes: 0.02 10*3/uL (ref 0.00–0.07)
Basophils Absolute: 0 10*3/uL (ref 0.0–0.1)
Basophils Relative: 0 %
Eosinophils Absolute: 0.2 10*3/uL (ref 0.0–0.5)
Eosinophils Relative: 2 %
HCT: 37.6 % (ref 36.0–46.0)
Hemoglobin: 12.6 g/dL (ref 12.0–15.0)
Immature Granulocytes: 0 %
Lymphocytes Relative: 20 %
Lymphs Abs: 1.8 10*3/uL (ref 0.7–4.0)
MCH: 29.1 pg (ref 26.0–34.0)
MCHC: 33.5 g/dL (ref 30.0–36.0)
MCV: 86.8 fL (ref 80.0–100.0)
Monocytes Absolute: 0.5 10*3/uL (ref 0.1–1.0)
Monocytes Relative: 5 %
Neutro Abs: 6.5 10*3/uL (ref 1.7–7.7)
Neutrophils Relative %: 73 %
Platelets: 128 10*3/uL — ABNORMAL LOW (ref 150–400)
RBC: 4.33 MIL/uL (ref 3.87–5.11)
RDW: 13.6 % (ref 11.5–15.5)
WBC: 8.9 10*3/uL (ref 4.0–10.5)
nRBC: 0 % (ref 0.0–0.2)

## 2022-05-04 LAB — LIPASE, BLOOD: Lipase: 25 U/L (ref 11–51)

## 2022-05-04 MED ORDER — APIXABAN 5 MG PO TABS
5.0000 mg | ORAL_TABLET | Freq: Two times a day (BID) | ORAL | Status: DC
  Administered 2022-05-04 – 2022-05-10 (×13): 5 mg via ORAL
  Filled 2022-05-04 (×11): qty 1
  Filled 2022-05-04: qty 2
  Filled 2022-05-04: qty 1

## 2022-05-04 MED ORDER — DIAZEPAM 5 MG/ML IJ SOLN
2.5000 mg | Freq: Once | INTRAMUSCULAR | Status: AC
  Administered 2022-05-04: 2.5 mg via INTRAVENOUS
  Filled 2022-05-04: qty 2

## 2022-05-04 MED ORDER — MECLIZINE HCL 12.5 MG PO TABS: 12.5000 mg | ORAL_TABLET | Freq: Three times a day (TID) | ORAL | Status: DC | PRN

## 2022-05-04 MED ORDER — DIAZEPAM 5 MG/ML IJ SOLN
2.5000 mg | Freq: Three times a day (TID) | INTRAMUSCULAR | Status: DC | PRN
  Administered 2022-05-04: 2.5 mg via INTRAVENOUS
  Filled 2022-05-04: qty 2

## 2022-05-04 MED ORDER — ACETAMINOPHEN 325 MG PO TABS: 325.0000 mg | ORAL_TABLET | ORAL | Status: DC | PRN

## 2022-05-04 MED ORDER — SODIUM CHLORIDE 0.9 % IV BOLUS
1000.0000 mL | Freq: Once | INTRAVENOUS | Status: AC
  Administered 2022-05-04: 1000 mL via INTRAVENOUS

## 2022-05-04 MED ORDER — MECLIZINE HCL 25 MG PO TABS
25.0000 mg | ORAL_TABLET | Freq: Three times a day (TID) | ORAL | Status: DC
  Administered 2022-05-04 – 2022-05-09 (×14): 25 mg via ORAL
  Filled 2022-05-04 (×15): qty 1

## 2022-05-04 MED ORDER — PANTOPRAZOLE SODIUM 40 MG PO TBEC
40.0000 mg | DELAYED_RELEASE_TABLET | Freq: Every day | ORAL | Status: DC
  Administered 2022-05-04 – 2022-05-10 (×7): 40 mg via ORAL
  Filled 2022-05-04 (×7): qty 1

## 2022-05-04 MED ORDER — ONDANSETRON HCL 4 MG/2ML IJ SOLN
4.0000 mg | Freq: Once | INTRAMUSCULAR | Status: DC
  Filled 2022-05-04: qty 2

## 2022-05-04 MED ORDER — ONDANSETRON HCL 4 MG/2ML IJ SOLN
4.0000 mg | INTRAMUSCULAR | Status: DC | PRN
  Administered 2022-05-06: 4 mg via INTRAVENOUS
  Filled 2022-05-04: qty 2

## 2022-05-04 MED ORDER — ATORVASTATIN CALCIUM 40 MG PO TABS
40.0000 mg | ORAL_TABLET | Freq: Every day | ORAL | Status: DC
  Administered 2022-05-04 – 2022-05-10 (×7): 40 mg via ORAL
  Filled 2022-05-04 (×7): qty 1

## 2022-05-04 MED ORDER — LABETALOL HCL 5 MG/ML IV SOLN: 10.0000 mg | INTRAVENOUS | Status: DC | PRN

## 2022-05-04 MED ORDER — LACTATED RINGERS IV SOLN: INTRAVENOUS | Status: AC

## 2022-05-04 NOTE — Telephone Encounter (Signed)
Please schedule pt ov to f/u on this.

## 2022-05-04 NOTE — ED Notes (Signed)
[name] [name] [phone] call when transferred.

## 2022-05-04 NOTE — ED Notes (Signed)
Patient transported to CT 

## 2022-05-04 NOTE — Progress Notes (Signed)
Gave Diazepam 2.5 mg for pt via IV at MRI. Wasted 1.75 mg at [employer] room witnessed with Karsten Fells RN.  Morene Rankins, LPN 00:16 PM 42/90/37

## 2022-05-04 NOTE — ED Triage Notes (Signed)
Patient BIB [facility] from Home.  Endorses N/V for approximately 3 Days. Worsening since it began. No Known Fevers or Diarrhea.   VSS with [facility]. Given 4 mg of Zofran and 400 mL of NS.  NAD Noted during Triage. A&Ox4. GCS 15. BIB [facility].

## 2022-05-04 NOTE — Telephone Encounter (Signed)
Caller states: - Patient has been complaining of increasingly worse dizziness  - Other symptoms are nausea and weakness when standing  - She was unable to sit up in bed   Patient and caller have been transferred to triage.

## 2022-05-04 NOTE — H&P (Incomplete)
PCP:   Marin Olp, MD   Chief Complaint:  Dizziness  HPI: This is a 89 year old female with past medical history chronic diastolic heart failure, COPD, atrial fibrillation, hyperlipidemia, and hypertension.  She presents with a complaint of being extremely hydrated, starting 2 days ago.  She states additionally it was mild but as the days progressed so that her symptoms.  This morning when she got up she could not stand up because the world seems to go around her.  She could not focus her eyes.  She called her daughter.  After her daughter came she became nauseous and vomited.  After that she had dry heaves.  Whenever she moves her head elevated she became very dizzy.  She denies tinnitus.  She has had progressive hearing loss over the past few years.  She denies floaters.  She states her blood pressure is typically normal systolic blood pressures in the 110s.  Her daughter took her to [facility] she was given IV Zofran, to me she states that the Zofran helped, so the EDP she stated that they did not.  She was then given IV fluid bolus 400 cc NS and IV Valium.  Orthostatic vitals done she was reportedly positive (I am unable to find that recorded).  Dix-Hallpike maneuver was also positive.  Transfer requested  Review of Systems:  Dizziness  Past Medical History: Past Medical History:  Diagnosis Date   Acute on chronic diastolic heart failure ([place]) 12/30/2020   Allergic rhinoconjunctivitis    Allergy    Anxiety    pt. managed- uses deep breathing    Arthritis    low back , stenosis   COLONIC POLYPS, RECURRENT 08/29/2006   2008 last colonoscopy. No further colonoscopy.      COPD (chronic obstructive pulmonary disease) (HCC)    Diverticulosis    Dysrhythmia    afib   Eczema    Environmental allergies    allergy shot- q friday in Dr. Janee Morn office. PFT's abnormal- recommended Spiriva to use preop & will d/c after surgery   GERD (gastroesophageal reflux disease)     Hiatal hernia    Hyperlipidemia    Hypertension    Lung nodule 2011   Paroxysmal atrial fibrillation ([place]) 11/27/2019   Peripheral vascular disease (HCC)    Shingles    Stenosis of popliteal artery (HCC)    blood clots in legs long ago     Trigeminal neuralgia    Left buttocks   Past Surgical History:  Procedure Laterality Date   ABDOMINAL AORTAGRAM N/A 06/17/2011   Procedure: ABDOMINAL Maxcine Ham;  Surgeon: Elam Dutch, MD;  [facility] CATH LAB;  Service: Cardiovascular;  Laterality: N/A;   ABDOMINAL AORTOGRAM W/LOWER EXTREMITY Bilateral 06/22/2018   Procedure: ABDOMINAL AORTOGRAM W/LOWER EXTREMITY;  Surgeon: Elam Dutch, MD;  Location: [facility] CV LAB;  Service: Cardiovascular;  Laterality: Bilateral;   ABDOMINAL AORTOGRAM W/LOWER EXTREMITY Bilateral 11/29/2019   Procedure: ABDOMINAL AORTOGRAM W/LOWER EXTREMITY;  Surgeon: Elam Dutch, MD;  Location: [facility] CV LAB;  Service: Cardiovascular;  Laterality: Bilateral;   CATARACT EXTRACTION, BILATERAL     w IOL   CHOLECYSTECTOMY  1998   COLONOSCOPY     ENDARTERECTOMY FEMORAL Left 12/09/2019   Procedure: ENDARTERECTOMY FEMORAL with bovine patch angioplasty.;  Surgeon: Elam Dutch, MD;  Location: [facility];  Service: Vascular;  Laterality: Left;   FEMORAL-POPLITEAL BYPASS GRAFT        x2 surgeries 1990's & 2009  FEMORAL-POPLITEAL BYPASS GRAFT Right 12/09/2019   Procedure: REDO RIGHT FEMORAL-POPLITEAL ARTERY BYPASS GRAFT;  Surgeon: Elam Dutch, MD;  Location: [facility];  Service: Vascular;  Laterality: Right;   INJECTION KNEE Right Aug. 2016   Gel injection for pain   LUMBAR FUSION  07/06/2011   TONSILLECTOMY     as a teenager    TUBAL LIGATION      Medications: Prior to Admission medications   Medication Sig Start Date End Date Taking? Authorizing Provider  acetaminophen (TYLENOL) 325 MG tablet Take 1-2 tablets (325-650 mg total) by mouth every 4 (four) hours as needed for mild pain (or temp >/= 101 F).  12/23/19  Yes Angiulli, Lavon Paganini, PA-C  apixaban (ELIQUIS) 5 MG TABS tablet Take 1 tablet by mouth twice daily 01/10/22  Yes Skeet Latch, MD  atorvastatin (LIPITOR) 40 MG tablet Take 1 tablet by mouth once daily 03/14/22  Yes Marin Olp, MD  baclofen (LIORESAL) 10 MG tablet TAKE 1/2 (ONE-HALF) TABLET BY MOUTH NIGHTLY 02/16/22  Yes Raulkar, Clide Deutscher, MD  famotidine (PEPCID) 20 MG tablet Take '20mg'$  in the evening for 1 week. Then take as needed for indigestion. 11/04/21  Yes Loel Dubonnet, NP  furosemide (LASIX) 40 MG tablet Take 1 tablet (40 mg total) by mouth daily. 11/01/21  Yes Skeet Latch, MD  ipratropium (ATROVENT) 0.06 % nasal spray USE 2 SPRAY(S) IN EACH NOSTRIL TWICE DAILY 03/07/22  Yes Kozlow, Donnamarie Poag, MD  lidocaine (LIDODERM) 5 % Place 1 patch onto the skin daily. Remove & Discard patch within 12 hours or as directed by MD 07/13/21  Yes Raulkar, Clide Deutscher, MD  lisinopril (ZESTRIL) 5 MG tablet Take 1 tablet (5 mg total) by mouth daily. 10/22/21 10/17/22 Yes Loel Dubonnet, NP  metoprolol succinate (TOPROL-XL) 100 MG 24 hr tablet Take 1 tablet (100 mg total) by mouth daily. Take with or immediately following a meal. 10/08/21  Yes Skeet Latch, MD  Multiple Vitamin (MULTIVITAMIN WITH MINERALS) TABS tablet Take 1 tablet by mouth daily.   Yes [provider]  pantoprazole (PROTONIX) 40 MG tablet TAKE 1 TABLET BY MOUTH TWICE DAILY AS DIRECTED 04/21/22  Yes Marin Olp, MD  RESTASIS 0.05 % ophthalmic emulsion Place 1 drop into both eyes 2 (two) times daily as needed (dry eyes).  01/31/18  Yes [provider]    Allergies:   Allergies  Allergen Reactions   Sulfa Antibiotics Other (See Comments)    Cold sweat light headed and disorientation   Tiotropium Bromide Shortness Of Breath and Other (See Comments)    Sore throat also   Latex Itching and Rash   Ultram [Tramadol] Nausea And Vomiting   5-Alpha Reductase Inhibitors     Social History:   reports that she quit smoking about 28 years ago. Her smoking use included cigarettes. She has a 60.00 pack-year smoking history. She has never used smokeless tobacco. She reports that she does not drink alcohol and does not use drugs.  Family History: Family History  Problem Relation Age of Onset   Arthritis Mother    Hypertension Mother    Heart disease Father    Colon polyps Father    Breast cancer Sister    Breast cancer Daughter    Hypertension Son    Lung cancer Brother    Colon cancer Sister    Brain cancer Sister    Lung cancer Sister    Anesthesia problems Neg Hx    Hypotension Neg Hx  Malignant hyperthermia Neg Hx    Pseudochol deficiency Neg Hx     Physical Exam: Vitals:   05/04/22 1800 05/04/22 1830 05/04/22 1900 05/04/22 2034  BP: 130/81 137/74 125/74 (!) 148/75  Pulse: (!) 48 85 82 88  Resp: (!) 27 13 (!) 22 18  Temp:    98.1 F (36.7 C)  TempSrc:    Oral  SpO2: 96% 99% 96% 96%  Weight:      Height:        General:  Alert and oriented times three, well developed and nourished, no acute distress, older weaker Eyes: PERRLA, pink conjunctiva, no scleral icterus ENT: Moist oral mucosa, neck supple, no thyromegaly Lungs: clear to ascultation, no wheeze, no crackles, no use of accessory muscles Cardiovascular: regular rate and rhythm, no regurgitation, no gallops, no murmurs. No carotid bruits, no JVD Abdomen: soft, positive BS, non-tender, non-distended, no organomegaly, not an acute abdomen GU: not examined Neuro: CN II - XII grossly intact, sensation intact Musculoskeletal: strength 5/5 all extremities, no clubbing, cyanosis or edema Skin: no rash, no subcutaneous crepitation, no decubitus Psych: appropriate patient   Labs on Admission:  Recent Labs    05/04/22 1140  NA 142  K 3.7  CL 102  CO2 29  GLUCOSE 140*  BUN 21  CREATININE 0.96  CALCIUM 8.7*   Recent Labs    05/04/22 1140  AST 16  ALT 8  ALKPHOS 62  BILITOT 0.8  PROT 6.0*   ALBUMIN 3.9   Recent Labs    05/04/22 1140  LIPASE 25   Recent Labs    05/04/22 1140  WBC 8.9  NEUTROABS 6.5  HGB 12.6  HCT 37.6  MCV 86.8  PLT 128*    Micro Results: Recent Results (from the past 240 hour(s))  Resp panel by RT-PCR (RSV, Flu A&B, Covid) Anterior Nasal Swab     Status: None   Collection Time: 05/04/22 11:50 AM   Specimen: Anterior Nasal Swab  Result Value Ref Range Status   SARS Coronavirus 2 by RT PCR NEGATIVE NEGATIVE Final    Comment: (NOTE) SARS-CoV-2 target nucleic acids are NOT DETECTED.  The SARS-CoV-2 RNA is generally detectable in upper respiratory specimens during the acute phase of infection. The lowest concentration of SARS-CoV-2 viral copies this assay can detect is 138 copies/mL. A negative result does not preclude SARS-Cov-2 infection and should not be used as the sole basis for treatment or other patient management decisions. A negative result may occur with  improper specimen collection/handling, submission of specimen other than nasopharyngeal swab, presence of viral mutation(s) within the areas targeted by this assay, and inadequate number of viral copies(<138 copies/mL). A negative result must be combined with clinical observations, patient history, and epidemiological information. The expected result is Negative.  Fact Sheet for Patients:  EntrepreneurPulse.com.au  Fact Sheet for Healthcare Providers:  IncredibleEmployment.be  This test is no t yet approved or cleared by the [place] FDA and  has been authorized for detection and/or diagnosis of SARS-CoV-2 by FDA under an Emergency Use Authorization (EUA). This EUA will remain  in effect (meaning this test can be used) for the duration of the COVID-19 declaration under Section 564(b)(1) of the Act, 21 U.S.C.section 360bbb-3(b)(1), unless the authorization is terminated  or revoked sooner.       Influenza A by PCR NEGATIVE NEGATIVE  Final   Influenza B by PCR NEGATIVE NEGATIVE Final    Comment: (NOTE) The Xpert Xpress SARS-CoV-2/FLU/RSV plus assay is intended as  an aid in the diagnosis of influenza from Nasopharyngeal swab specimens and should not be used as a sole basis for treatment. Nasal washings and aspirates are unacceptable for Xpert Xpress SARS-CoV-2/FLU/RSV testing.  Fact Sheet for Patients: EntrepreneurPulse.com.au  Fact Sheet for Healthcare Providers: IncredibleEmployment.be  This test is not yet approved or cleared by the [place] FDA and has been authorized for detection and/or diagnosis of SARS-CoV-2 by FDA under an Emergency Use Authorization (EUA). This EUA will remain in effect (meaning this test can be used) for the duration of the COVID-19 declaration under Section 564(b)(1) of the Act, 21 U.S.C. section 360bbb-3(b)(1), unless the authorization is terminated or revoked.     Resp Syncytial Virus by PCR NEGATIVE NEGATIVE Final    Comment: (NOTE) Fact Sheet for Patients: EntrepreneurPulse.com.au  Fact Sheet for Healthcare Providers: IncredibleEmployment.be  This test is not yet approved or cleared by the [place] FDA and has been authorized for detection and/or diagnosis of SARS-CoV-2 by FDA under an Emergency Use Authorization (EUA). This EUA will remain in effect (meaning this test can be used) for the duration of the COVID-19 declaration under Section 564(b)(1) of the Act, 21 U.S.C. section 360bbb-3(b)(1), unless the authorization is terminated or revoked.  Performed at [facility], [address] [place], [place] [address]      Radiological Exams on Admission: CT Head Wo Contrast  Result Date: 05/04/2022 CLINICAL DATA:  Worsening dizziness, nausea and weakness when standing. EXAM: CT HEAD WITHOUT CONTRAST TECHNIQUE: Contiguous axial images were obtained from the base of the  skull through the vertex without intravenous contrast. RADIATION DOSE REDUCTION: This exam was performed according to the departmental dose-optimization program which includes automated exposure control, adjustment of the mA and/or kV according to patient size and/or use of iterative reconstruction technique. COMPARISON:  None Available. FINDINGS: Brain: No evidence of acute infarction, hemorrhage, hydrocephalus, extra-axial collection or mass lesion/mass effect. There is mild cerebral volume loss with associated ex vacuo dilatation. Periventricular white matter hypoattenuation likely represents chronic small vessel ischemic disease. Vascular: There are vascular calcifications in the carotid siphons. Skull: Normal. Negative for fracture or focal lesion. Sinuses/Orbits: No acute finding. Other: None. IMPRESSION: No acute intracranial process. Electronically Signed   By: Zerita Boers M.D.   On: 05/04/2022 12:36    Assessment/Plan Present on Admission:  Dizziness -Admit to med telemetry -Differential diagnosis posterior circulation CVA, orthostatic hypotension, vertigo -MRI head, neurochecks -Gentle IV fluids 50 cc/hr for 10 hours -Repeat orthostatic vitals in a.m. -Meclizine scheduled Q8 -Zofran as needed for nausea and vomiting  Hyperlipidemia -Stable, atorvastatin resumed   Essential hypertension -Metoprolol.   COPD mixed type ([place]) -Nebulizers as needed   CKD (chronic kidney disease), stage III (HCC) -Gentle IV limited fluid hydration underway, BMP in a.m.   Permanent atrial fibrillation (HCC)  Secondary hypercoagulable state ([place]) -Continue Toprol and Eliquis   Chronic diastolic heart failure ([place])   Hellon Vaccarella 05/04/2022, 9:28 PM

## 2022-05-04 NOTE — Telephone Encounter (Signed)
Final Disposition: Go to ED Now; patient agreed to comply.    Patient Name: [name] [name] Gender: Female DOB: 01-05-1925 Age: 89 Y 5 M 24 D Return Phone Number: [phone] (Primary) Address: City/ State/ Zip: [name]  [address] Client [facility] [place] Client Site [facility] Provider [name]- MD Contact Type Call Who Is Calling Patient / Member / Family / Caregiver Call Type Triage / Clinical Caller Name Alda Ponder Relationship To Patient Daughter Return Phone Number [phone] (Primary) Chief Complaint Weakness, Generalized Reason for Call Symptomatic / Request for [place] states that her mom has been complaining of dizziness, nausea and weakness Translation No Nurse Assessment Nurse: Vallery Sa, RN, Tye [place] Date/Time (Eastern Time): 05/04/2022 10:11:22 AM Confirm and document reason for call. If symptomatic, describe symptoms. ---Caller states that her mom developed dizziness about 2 days ago and nausea with weakness this morning. No severe breathing difficulty or blueness around her lips. No chest pain. No injury in the past 3 days. No unusual bleeding. No fever, vomiting or diarrhea. She last passed urine about 10 minutes ago. Alert and responsive. Does the patient have any new or worsening symptoms? ---Yes Will a triage be completed? ---Yes Related visit to physician within the last 2 weeks? ---No Does the PT have any chronic conditions? (i.e. diabetes, asthma, this includes High risk factors for pregnancy, etc.) ---Yes List chronic conditions. ---A-fib, High Blood Pressure, COPD Is this a behavioral health or substance abuse call? ---No Guidelines Guideline Title Affirmed Question Affirmed Notes Nurse Date/Time (Eastern Time) Dizziness - Lightheadedness SEVERE dizziness (e.g., unable to stand, requires support Vallery Sa, RN, Tye [place]  05/04/2022 10:15:38 AM PLEASE NOTE: All timestamps contained within this report are represented as [place] Standard Time. CONFIDENTIALTY NOTICE: This fax transmission is intended only for the addressee. It contains information that is legally privileged, confidential or otherwise protected from use or disclosure. If you are not the intended recipient, you are strictly prohibited from reviewing, disclosing, copying using or disseminating any of this information or taking any action in reliance on or regarding this information. If you have received this fax in error, please notify us immediately by telephone so that we can arrange for its return to [place]. Phone: [phone], Toll-Free: [phone], Fax: [phone] Page: 2 of 2 Call Id: [id number] Guidelines Guideline Title Affirmed Question Affirmed Notes Nurse Date/Time [name] Time) to walk, feels like passing out now) Disp. Time [name] Time) Disposition Final User 05/04/2022 10:18:46 AM Go to ED Now (or PCP triage) Yes Vallery Sa, RN, Tye [place] Final Disposition 05/04/2022 10:18:46 AM Go to ED Now (or PCP triage) Yes Vallery Sa, RN, [name] Disagree/Comply Comply Caller Understands Yes PreDisposition Go to ED Care Advice Given Per Guideline GO TO ED NOW (OR PCP TRIAGE): ANOTHER ADULT SHOULD DRIVE: * It is better and safer if another adult drives instead of you. BRING MEDICINES: * Bring a list of your current medicines when you go to the Emergency Department (ER). * Bring the pill bottles too. This will help the doctor (or NP/PA) to make certain you are taking the right medicines and the right dose. CARE ADVICE given per Dizziness (Adult) guideline. Referrals [facility] - ED

## 2022-05-04 NOTE — Telephone Encounter (Signed)
FYI

## 2022-05-04 NOTE — ED Notes (Signed)
ED Provider at bedside. 

## 2022-05-04 NOTE — ED Provider Notes (Signed)
[facility] DEPT Provider Note   CSN: [id number] Arrival date & time: 05/04/22  1132     History  Chief Complaint  Patient presents with   Emesis   Dizziness    [name] [name] is a 89 y.o. female.  Patient here with dizziness for the last 3 days worsening.  Mostly when [name] [name] changes directions or stands up.  Today when [name] [name] stood up [name] [name] was unable to walk and felt very nauseous, her vision got blurry and [name] [name] felt like [name] [name] is in a pass out.  [name] [name] denies any history of vertigo or stroke.  Is on Eliquis for A-fib.  No recent illness.  No nausea vomiting diarrhea.  Denies progressive for the last 3 days.  Denies any weakness or numbness.  Having some vision issues at times.  Feels like [name] [name] is in a pass out and too weak to get up and walk now.  Denies any chest pain or shortness of breath or nausea vomiting diarrhea.  [name] [name] is only nauseous when [name] [name] feels dizzy and lightheaded.  The history is provided by the patient.       Home Medications Prior to Admission medications   Medication Sig Start Date End Date Taking? Authorizing Provider  acetaminophen (TYLENOL) 325 MG tablet Take 1-2 tablets (325-650 mg total) by mouth every 4 (four) hours as needed for mild pain (or temp >/= 101 F). 12/23/19   Angiulli, Lavon Paganini, PA-C  apixaban (ELIQUIS) 5 MG TABS tablet Take 1 tablet by mouth twice daily 01/10/22   Skeet Latch, MD  atorvastatin (LIPITOR) 40 MG tablet Take 1 tablet by mouth once daily 03/14/22   Marin Olp, MD  baclofen (LIORESAL) 10 MG tablet TAKE 1/2 (ONE-HALF) TABLET BY MOUTH NIGHTLY 02/16/22   Raulkar, Clide Deutscher, MD  famotidine (PEPCID) 20 MG tablet Take '20mg'$  in the evening for 1 week. Then take as needed for indigestion. 11/04/21   Loel Dubonnet, NP  furosemide (LASIX) 40 MG tablet Take 1 tablet (40 mg total) by mouth daily. 11/01/21   Skeet Latch, MD  ipratropium (ATROVENT) 0.06 % nasal spray USE 2 SPRAY(S) IN EACH NOSTRIL TWICE DAILY 03/07/22    Kozlow, Donnamarie Poag, MD  lidocaine (LIDODERM) 5 % Place 1 patch onto the skin daily. Remove & Discard patch within 12 hours or as directed by MD 07/13/21   Raulkar, Clide Deutscher, MD  lisinopril (ZESTRIL) 5 MG tablet Take 1 tablet (5 mg total) by mouth daily. 10/22/21 10/17/22  Loel Dubonnet, NP  metoprolol succinate (TOPROL-XL) 100 MG 24 hr tablet Take 1 tablet (100 mg total) by mouth daily. Take with or immediately following a meal. 10/08/21   Skeet Latch, MD  Multiple Vitamin (MULTIVITAMIN WITH MINERALS) TABS tablet Take 1 tablet by mouth daily.    [provider]  pantoprazole (PROTONIX) 40 MG tablet TAKE 1 TABLET BY MOUTH TWICE DAILY AS DIRECTED 04/21/22   Marin Olp, MD  RESTASIS 0.05 % ophthalmic emulsion Place 1 drop into both eyes 2 (two) times daily as needed (dry eyes).  01/31/18   [provider]      Allergies    Sulfa antibiotics, Tiotropium bromide, Latex, Ultram [tramadol], and 5-alpha reductase inhibitors    Review of Systems   Review of Systems  Physical Exam Updated Vital Signs BP (!) 118/57   Pulse 85   Temp (!) 97.3 F (36.3 C) (Oral)   Resp (!) 28   Ht '4\' 11"'$  (1.499 m)   Wt  75.3 kg   SpO2 95%   BMI 33.53 kg/m  Physical Exam Vitals and nursing note reviewed.  Constitutional:      General: [name] [name] is not in acute distress.    Appearance: [name] [name] is well-developed. [name] [name] is ill-appearing.  HENT:     Head: Normocephalic and atraumatic.     Right Ear: Tympanic membrane normal.     Left Ear: Tympanic membrane normal.     Nose: Nose normal.     Mouth/Throat:     Mouth: Mucous membranes are moist.  Eyes:     Extraocular Movements: Extraocular movements intact.     Conjunctiva/sclera: Conjunctivae normal.     Pupils: Pupils are equal, round, and reactive to light.  Cardiovascular:     Rate and Rhythm: Normal rate and regular rhythm.     Pulses: Normal pulses.     Heart sounds: Normal heart sounds. No murmur heard. Pulmonary:     Effort:  Pulmonary effort is normal. No respiratory distress.     Breath sounds: Normal breath sounds.  Abdominal:     General: Abdomen is flat.     Palpations: Abdomen is soft.     Tenderness: There is no abdominal tenderness.  Musculoskeletal:        General: No swelling. Normal range of motion.     Cervical back: Normal range of motion and neck supple.  Skin:    General: Skin is warm and dry.     Capillary Refill: Capillary refill takes less than 2 seconds.  Neurological:     General: No focal deficit present.     Mental Status: [name] [name] is alert and oriented to person, place, and time.     Cranial Nerves: No cranial nerve deficit.     Sensory: No sensory deficit.     Motor: No weakness.     Coordination: Coordination normal.     Comments: 5+ out of 5 strength throughout, normal speech, normal visual fields, horizontal nystagmus, seems to have a positive Dix-Hallpike but [name] [name] is very symptomatic when [name] [name] stands up as well and cannot ambulate  Psychiatric:        Mood and Affect: Mood normal.     ED Results / Procedures / Treatments   Labs (all labs ordered are listed, but only abnormal results are displayed) Labs Reviewed  COMPREHENSIVE METABOLIC PANEL - Abnormal; Notable for the following components:      Result Value   Glucose, Bld 140 (*)    Calcium 8.7 (*)    Total Protein 6.0 (*)    GFR, Estimated 54 (*)    All other components within normal limits  CBC WITH DIFFERENTIAL/PLATELET - Abnormal; Notable for the following components:   Platelets 128 (*)    All other components within normal limits  RESP PANEL BY RT-PCR (RSV, FLU A&B, COVID)  RVPGX2  LIPASE, BLOOD  URINALYSIS, ROUTINE W REFLEX MICROSCOPIC    EKG EKG Interpretation  Date/Time:  Wednesday May 04 2022 12:05:39 EST Ventricular Rate:  75 PR Interval:    QRS Duration: 85 QT Interval:  395 QTC Calculation: 442 R Axis:   76 Text Interpretation: Atrial fibrillation Baseline wander in lead(s) V3 Confirmed by  Lennice Sites ([id number]) on 05/04/2022 12:09:15 PM  Radiology CT Head Wo Contrast  Result Date: 05/04/2022 CLINICAL DATA:  Worsening dizziness, nausea and weakness when standing. EXAM: CT HEAD WITHOUT CONTRAST TECHNIQUE: Contiguous axial images were obtained from the base of the skull through the vertex without intravenous contrast. RADIATION DOSE REDUCTION: This  exam was performed according to the departmental dose-optimization program which includes automated exposure control, adjustment of the mA and/or kV according to patient size and/or use of iterative reconstruction technique. COMPARISON:  None Available. FINDINGS: Brain: No evidence of acute infarction, hemorrhage, hydrocephalus, extra-axial collection or mass lesion/mass effect. There is mild cerebral volume loss with associated ex vacuo dilatation. Periventricular white matter hypoattenuation likely represents chronic small vessel ischemic disease. Vascular: There are vascular calcifications in the carotid siphons. Skull: Normal. Negative for fracture or focal lesion. Sinuses/Orbits: No acute finding. Other: None. IMPRESSION: No acute intracranial process. Electronically Signed   By: Zerita Boers M.D.   On: 05/04/2022 12:36    Procedures Procedures    Medications Ordered in ED Medications  ondansetron [facility]) injection 4 mg (0 mg Intravenous Hold 05/04/22 1228)  sodium chloride 0.9 % bolus 1,000 mL (1,000 mLs Intravenous New Bag/Given 05/04/22 1158)  diazepam (VALIUM) injection 2.5 mg (2.5 mg Intravenous Given 05/04/22 1212)    ED Course/ Medical Decision Making/ A&P                           Medical Decision Making Amount and/or Complexity of Data Reviewed Labs: ordered. Radiology: ordered.  Risk Prescription drug management. Decision regarding hospitalization.   KRISNA OMAR is here with dizziness.  History of atrial fibrillation on Eliquis.  History of high cholesterol, acid reflux, peripheral vascular disease.  No history  of stroke.  [name] [name] has positive orthostatics upon arrival.  Blood pressure is 135/71 when [name] [name] stands it is 100/70.  [name] [name] is very symptomatic when [name] [name] stands.  [name] [name] is unable to walk.  However [name] [name] also has some horizontal nystagmus on exam worse when [name] [name] turns her head to the left.  Seems like [name] [name] has a positive Dix-Hallpike.  Overall her cerebellar signs appear to be normal.  [name] [name] does not have any obvious strength deficits or vision deficits or sensation deficits.  Differential diagnosis is orthostatic hypotension versus peripheral vertigo, still possibly could be stroke [name] [name] denies any nausea vomiting diarrhea and seems less likely to be electrolyte abnormality or dehydration or viral process or infectious process.  [name] [name] lives by herself [name] [name] ambulates with a walker at baseline.  Overall I anticipate admission as [name] [name] is extremely symptomatic.  Will give IV fluid bolus will give a dose IV Valium.  [name] [name] is already been given IV Zofran with not much help by EMS.  This could be medication side effect as well.  Will get CBC, CMP, urinalysis, COVID and flu testing, head CT.  Overall COVID and flu swab negative per my review interpretation of labs there is no significant anemia or electrolyte abnormality or kidney injury.  Head CT per my review interpretation shows no head bleed or acute process.  [name] [name] is not having any nausea symptoms but still very symptomatic from a dizziness standpoint especially when [name] [name] turns her head to the left.  My suspicion is that this is likely peripheral vertigo but [name] [name] is also has some orthostatics and not sure if this is medication related as well.  Think [name] [name] would benefit from observation stay to get an MRI to rule out stroke and work with physical therapy and PT and have further treatment.  Patient to be admitted to medicine for further care.  This chart was dictated using voice recognition software.  Despite best efforts to proofread,  errors can occur which can change the documentation  meaning.  Final Clinical Impression(s) / ED Diagnoses Final diagnoses:  Orthostatic hypotension  Dizziness    Rx / DC Orders ED Discharge Orders     None         Lennice Sites, DO 05/04/22 1314

## 2022-05-04 NOTE — Telephone Encounter (Signed)
I would try to work patient in this afternoon but out of office- not being able to sit up in bed is particularly concerning- ? Vertigo but she has elevated stroke risk so prefer not to wait until tomorrow

## 2022-05-04 NOTE — ED Notes (Signed)
Pt trial with PO PO fluids. Tolerates well. Sat up and previously held PO meds given. Stands to bedside toilet without incident. Steady on feet.

## 2022-05-04 NOTE — Plan of Care (Signed)
  Problem: Education: Goal: Knowledge of General Education information will improve Description: Including pain rating scale, medication(s)/side effects and non-pharmacologic comfort measures 05/04/2022 2043 by Morene Rankins, LPN Outcome: Progressing 05/04/2022 2043 by Morene Rankins, LPN Outcome: Progressing   Problem: Health Behavior/Discharge Planning: Goal: Ability to manage health-related needs will improve 05/04/2022 2043 by Morene Rankins, LPN Outcome: Progressing 05/04/2022 2043 by Morene Rankins, LPN Outcome: Progressing   Problem: Clinical Measurements: Goal: Ability to maintain clinical measurements within normal limits will improve 05/04/2022 2043 by Morene Rankins, LPN Outcome: Progressing 05/04/2022 2043 by Morene Rankins, LPN Outcome: Progressing Goal: Will remain free from infection Outcome: Progressing Goal: Diagnostic test results will improve Outcome: Progressing Goal: Respiratory complications will improve Outcome: Progressing Goal: Cardiovascular complication will be avoided Outcome: Progressing   Problem: Activity: Goal: Risk for activity intolerance will decrease Outcome: Progressing   Problem: Nutrition: Goal: Adequate nutrition will be maintained Outcome: Progressing   Problem: Coping: Goal: Level of anxiety will decrease Outcome: Progressing   Problem: Elimination: Goal: Will not experience complications related to bowel motility Outcome: Progressing Goal: Will not experience complications related to urinary retention Outcome: Progressing   Problem: Pain Managment: Goal: General experience of comfort will improve Outcome: Progressing   Problem: Safety: Goal: Ability to remain free from injury will improve Outcome: Progressing   Problem: Skin Integrity: Goal: Risk for impaired skin integrity will decrease Outcome: Progressing

## 2022-05-05 ENCOUNTER — Observation Stay (HOSPITAL_COMMUNITY): Payer: Medicare Other

## 2022-05-05 DIAGNOSIS — R310 Gross hematuria: Secondary | ICD-10-CM | POA: Diagnosis not present

## 2022-05-05 DIAGNOSIS — R2681 Unsteadiness on feet: Secondary | ICD-10-CM | POA: Diagnosis not present

## 2022-05-05 DIAGNOSIS — R55 Syncope and collapse: Secondary | ICD-10-CM | POA: Diagnosis not present

## 2022-05-05 DIAGNOSIS — E669 Obesity, unspecified: Secondary | ICD-10-CM | POA: Diagnosis present

## 2022-05-05 DIAGNOSIS — Z79899 Other long term (current) drug therapy: Secondary | ICD-10-CM | POA: Diagnosis not present

## 2022-05-05 DIAGNOSIS — E785 Hyperlipidemia, unspecified: Secondary | ICD-10-CM | POA: Diagnosis present

## 2022-05-05 DIAGNOSIS — Z95828 Presence of other vascular implants and grafts: Secondary | ICD-10-CM | POA: Diagnosis not present

## 2022-05-05 DIAGNOSIS — R2689 Other abnormalities of gait and mobility: Secondary | ICD-10-CM | POA: Diagnosis not present

## 2022-05-05 DIAGNOSIS — K4090 Unilateral inguinal hernia, without obstruction or gangrene, not specified as recurrent: Secondary | ICD-10-CM | POA: Diagnosis not present

## 2022-05-05 DIAGNOSIS — Z1152 Encounter for screening for COVID-19: Secondary | ICD-10-CM | POA: Diagnosis not present

## 2022-05-05 DIAGNOSIS — M5441 Lumbago with sciatica, right side: Secondary | ICD-10-CM | POA: Diagnosis not present

## 2022-05-05 DIAGNOSIS — G500 Trigeminal neuralgia: Secondary | ICD-10-CM | POA: Diagnosis not present

## 2022-05-05 DIAGNOSIS — Z7901 Long term (current) use of anticoagulants: Secondary | ICD-10-CM | POA: Diagnosis not present

## 2022-05-05 DIAGNOSIS — H9190 Unspecified hearing loss, unspecified ear: Secondary | ICD-10-CM | POA: Diagnosis present

## 2022-05-05 DIAGNOSIS — J301 Allergic rhinitis due to pollen: Secondary | ICD-10-CM | POA: Diagnosis not present

## 2022-05-05 DIAGNOSIS — I4819 Other persistent atrial fibrillation: Secondary | ICD-10-CM | POA: Diagnosis not present

## 2022-05-05 DIAGNOSIS — D6869 Other thrombophilia: Secondary | ICD-10-CM | POA: Diagnosis present

## 2022-05-05 DIAGNOSIS — R42 Dizziness and giddiness: Secondary | ICD-10-CM

## 2022-05-05 DIAGNOSIS — M545 Low back pain, unspecified: Secondary | ICD-10-CM | POA: Diagnosis present

## 2022-05-05 DIAGNOSIS — G8929 Other chronic pain: Secondary | ICD-10-CM | POA: Diagnosis present

## 2022-05-05 DIAGNOSIS — M1711 Unilateral primary osteoarthritis, right knee: Secondary | ICD-10-CM | POA: Diagnosis not present

## 2022-05-05 DIAGNOSIS — I5032 Chronic diastolic (congestive) heart failure: Secondary | ICD-10-CM | POA: Diagnosis present

## 2022-05-05 DIAGNOSIS — M25571 Pain in right ankle and joints of right foot: Secondary | ICD-10-CM | POA: Diagnosis present

## 2022-05-05 DIAGNOSIS — I739 Peripheral vascular disease, unspecified: Secondary | ICD-10-CM | POA: Diagnosis present

## 2022-05-05 DIAGNOSIS — Z6833 Body mass index (BMI) 33.0-33.9, adult: Secondary | ICD-10-CM | POA: Diagnosis not present

## 2022-05-05 DIAGNOSIS — I4821 Permanent atrial fibrillation: Secondary | ICD-10-CM | POA: Diagnosis present

## 2022-05-05 DIAGNOSIS — F411 Generalized anxiety disorder: Secondary | ICD-10-CM | POA: Diagnosis not present

## 2022-05-05 DIAGNOSIS — R112 Nausea with vomiting, unspecified: Secondary | ICD-10-CM | POA: Diagnosis present

## 2022-05-05 DIAGNOSIS — J449 Chronic obstructive pulmonary disease, unspecified: Secondary | ICD-10-CM | POA: Diagnosis present

## 2022-05-05 DIAGNOSIS — Z800 Family history of malignant neoplasm of digestive organs: Secondary | ICD-10-CM | POA: Diagnosis not present

## 2022-05-05 DIAGNOSIS — G4730 Sleep apnea, unspecified: Secondary | ICD-10-CM | POA: Diagnosis not present

## 2022-05-05 DIAGNOSIS — I10 Essential (primary) hypertension: Secondary | ICD-10-CM

## 2022-05-05 DIAGNOSIS — Z83719 Family history of colon polyps, unspecified: Secondary | ICD-10-CM | POA: Diagnosis not present

## 2022-05-05 DIAGNOSIS — M109 Gout, unspecified: Secondary | ICD-10-CM | POA: Diagnosis not present

## 2022-05-05 DIAGNOSIS — I130 Hypertensive heart and chronic kidney disease with heart failure and stage 1 through stage 4 chronic kidney disease, or unspecified chronic kidney disease: Secondary | ICD-10-CM | POA: Diagnosis present

## 2022-05-05 DIAGNOSIS — R278 Other lack of coordination: Secondary | ICD-10-CM | POA: Diagnosis not present

## 2022-05-05 DIAGNOSIS — Z8249 Family history of ischemic heart disease and other diseases of the circulatory system: Secondary | ICD-10-CM | POA: Diagnosis not present

## 2022-05-05 DIAGNOSIS — K559 Vascular disorder of intestine, unspecified: Secondary | ICD-10-CM | POA: Diagnosis not present

## 2022-05-05 DIAGNOSIS — N183 Chronic kidney disease, stage 3 unspecified: Secondary | ICD-10-CM | POA: Diagnosis present

## 2022-05-05 DIAGNOSIS — R52 Pain, unspecified: Secondary | ICD-10-CM | POA: Diagnosis not present

## 2022-05-05 DIAGNOSIS — Z87891 Personal history of nicotine dependence: Secondary | ICD-10-CM | POA: Diagnosis not present

## 2022-05-05 DIAGNOSIS — K219 Gastro-esophageal reflux disease without esophagitis: Secondary | ICD-10-CM | POA: Diagnosis present

## 2022-05-05 DIAGNOSIS — K625 Hemorrhage of anus and rectum: Secondary | ICD-10-CM | POA: Diagnosis not present

## 2022-05-05 DIAGNOSIS — M6281 Muscle weakness (generalized): Secondary | ICD-10-CM | POA: Diagnosis not present

## 2022-05-05 LAB — ECHOCARDIOGRAM COMPLETE
AV Mean grad: 4.5 mmHg
AV Peak grad: 8.2 mmHg
Ao pk vel: 1.43 m/s
Area-P 1/2: 4.12 cm2
Height: 59 in
S' Lateral: 2 cm
Weight: 2656.1 oz

## 2022-05-05 LAB — URINALYSIS, ROUTINE W REFLEX MICROSCOPIC
Bilirubin Urine: NEGATIVE
Glucose, UA: NEGATIVE mg/dL
Hgb urine dipstick: NEGATIVE
Ketones, ur: NEGATIVE mg/dL
Nitrite: NEGATIVE
Protein, ur: NEGATIVE mg/dL
Specific Gravity, Urine: 1.013 (ref 1.005–1.030)
pH: 6 (ref 5.0–8.0)

## 2022-05-05 LAB — CBC
HCT: 35.9 % — ABNORMAL LOW (ref 36.0–46.0)
Hemoglobin: 12.4 g/dL (ref 12.0–15.0)
MCH: 30 pg (ref 26.0–34.0)
MCHC: 34.5 g/dL (ref 30.0–36.0)
MCV: 86.7 fL (ref 80.0–100.0)
Platelets: 120 10*3/uL — ABNORMAL LOW (ref 150–400)
RBC: 4.14 MIL/uL (ref 3.87–5.11)
RDW: 13.5 % (ref 11.5–15.5)
WBC: 7.2 10*3/uL (ref 4.0–10.5)
nRBC: 0 % (ref 0.0–0.2)

## 2022-05-05 LAB — HEMOGLOBIN A1C
Hgb A1c MFr Bld: 6 % — ABNORMAL HIGH (ref 4.8–5.6)
Mean Plasma Glucose: 125.5 mg/dL

## 2022-05-05 LAB — CREATININE, SERUM
Creatinine, Ser: 0.95 mg/dL (ref 0.44–1.00)
GFR, Estimated: 55 mL/min — ABNORMAL LOW (ref >60)

## 2022-05-05 MED ORDER — HEPARIN SODIUM (PORCINE) 5000 UNIT/ML IJ SOLN: 5000.0000 [IU] | Freq: Three times a day (TID) | INTRAMUSCULAR | Status: DC

## 2022-05-05 MED ORDER — STROKE: EARLY STAGES OF RECOVERY BOOK
Freq: Once | Status: AC
  Filled 2022-05-05: qty 1

## 2022-05-05 MED ORDER — ACETAMINOPHEN 160 MG/5ML PO SOLN: 650.0000 mg | ORAL | Status: DC | PRN

## 2022-05-05 MED ORDER — METOPROLOL SUCCINATE ER 25 MG PO TB24
25.0000 mg | ORAL_TABLET | Freq: Every day | ORAL | Status: DC
  Administered 2022-05-05 – 2022-05-06 (×2): 25 mg via ORAL
  Filled 2022-05-05 (×2): qty 1

## 2022-05-05 MED ORDER — ACETAMINOPHEN 325 MG PO TABS
650.0000 mg | ORAL_TABLET | ORAL | Status: DC | PRN
  Administered 2022-05-08: 650 mg via ORAL
  Filled 2022-05-05: qty 2

## 2022-05-05 MED ORDER — ACETAMINOPHEN 650 MG RE SUPP: 650.0000 mg | RECTAL | Status: DC | PRN

## 2022-05-05 MED ORDER — SENNOSIDES-DOCUSATE SODIUM 8.6-50 MG PO TABS: 1.0000 | ORAL_TABLET | Freq: Every evening | ORAL | Status: DC | PRN

## 2022-05-05 MED ORDER — LACTATED RINGERS IV SOLN: INTRAVENOUS | Status: DC

## 2022-05-05 NOTE — Progress Notes (Signed)
PROGRESS NOTE    [name] [name]  WYO:[id number] DOB: Aug 28, 1924 DOA: 05/04/2022 PCP: Marin Olp, MD    Brief Narrative:  Brought in with dizziness and N/V.   Assessment and Plan: Dizziness -Admit to med telemetry -MRI negative - orthostatic vitals pending -Meclizine scheduled Q8 (may need to hold prior to PT eval)-- vestibular PT ordered -Zofran as needed for nausea and vomiting   Hyperlipidemia -Stable, atorvastatin resumed   Essential hypertension -Metoprolol at a lower dose   COPD mixed type (HCC) -Nebulizers as needed   CKD (chronic kidney disease), stage IIIa (HCC) -d/c IVF once eating   Permanent atrial fibrillation (HCC) -Continue Toprol at a lower dose and Eliquis  Dyphagia -SLp eval  GErd -protonix  Obesity Estimated body mass index is 33.53 kg/m as calculated from the following:   Height as of this encounter: '4\' 11"'$  (1.499 m).   Weight as of this encounter: 75.3 kg.   DVT prophylaxis: SCD's Start: 05/05/22 0622 apixaban (ELIQUIS) tablet 5 mg    Code Status: Full Code Family Communication: son at bedside  Disposition Plan:  Level of care: Telemetry Medical Status is: Observation The patient will require care spanning > 2 midnights and should be moved to inpatient because: needing PT vestibular eval    Consultants:  PT vestibular   Subjective: Still with some dizziness  Objective: Vitals:   05/04/22 1900 05/04/22 2034 05/05/22 0515 05/05/22 0816  BP: 125/74 (!) 148/75 (!) 109/57 120/79  Pulse: 82 88 (!) 110 91  Resp: (!) '22 18 17 17  '$ Temp:  98.1 F (36.7 C) 97.7 F (36.5 C) 98 F (36.7 C)  TempSrc:  Oral Oral Oral  SpO2: 96% 96% 93% 95%  Weight:      Height:        Intake/Output Summary (Last 24 hours) at 05/05/2022 1001 Last data filed at 05/05/2022 0500 Gross per 24 hour  Intake 1402.76 ml  Output --  Net 1402.76 ml   Filed Weights   05/04/22 1135  Weight: 75.3 kg    Examination:   General: Appearance:     Obese female in no acute distress     Lungs:     respirations unlabored  Heart:    Normal heart rate. irregular   MS:   All extremities are intact.    Neurologic:   Awake, alert, oriented x 3. No apparent focal neurological           defect.        Data Reviewed: I have personally reviewed following labs and imaging studies  CBC: Recent Labs  Lab 05/04/22 1140 05/05/22 0652  WBC 8.9 7.2  NEUTROABS 6.5  --   HGB 12.6 12.4  HCT 37.6 35.9*  MCV 86.8 86.7  PLT 128* 774*   Basic Metabolic Panel: Recent Labs  Lab 05/04/22 1140 05/05/22 0652  NA 142  --   K 3.7  --   CL 102  --   CO2 29  --   GLUCOSE 140*  --   BUN 21  --   CREATININE 0.96 0.95  CALCIUM 8.7*  --    GFR: Estimated Creatinine Clearance: 31.3 mL/min (by C-G formula based on SCr of 0.95 mg/dL). Liver Function Tests: Recent Labs  Lab 05/04/22 1140  AST 16  ALT 8  ALKPHOS 62  BILITOT 0.8  PROT 6.0*  ALBUMIN 3.9   Recent Labs  Lab 05/04/22 1140  LIPASE 25   No results for input(s): "AMMONIA" in the last  168 hours. Coagulation Profile: No results for input(s): "INR", "PROTIME" in the last 168 hours. Cardiac Enzymes: No results for input(s): "CKTOTAL", "CKMB", "CKMBINDEX", "TROPONINI" in the last 168 hours. BNP (last 3 results) No results for input(s): "PROBNP" in the last 8760 hours. HbA1C: Recent Labs    05/05/22 0652  HGBA1C 6.0*   CBG: No results for input(s): "GLUCAP" in the last 168 hours. Lipid Profile: No results for input(s): "CHOL", "HDL", "LDLCALC", "TRIG", "CHOLHDL", "LDLDIRECT" in the last 72 hours. Thyroid Function Tests: No results for input(s): "TSH", "T4TOTAL", "FREET4", "T3FREE", "THYROIDAB" in the last 72 hours. Anemia Panel: No results for input(s): "VITAMINB12", "FOLATE", "FERRITIN", "TIBC", "IRON", "RETICCTPCT" in the last 72 hours. Sepsis Labs: No results for input(s): "PROCALCITON", "LATICACIDVEN" in the last 168 hours.  Recent Results (from the past 240  hour(s))  Resp panel by RT-PCR (RSV, Flu A&B, Covid) Anterior Nasal Swab     Status: None   Collection Time: 05/04/22 11:50 AM   Specimen: Anterior Nasal Swab  Result Value Ref Range Status   SARS Coronavirus 2 by RT PCR NEGATIVE NEGATIVE Final    Comment: (NOTE) SARS-CoV-2 target nucleic acids are NOT DETECTED.  The SARS-CoV-2 RNA is generally detectable in upper respiratory specimens during the acute phase of infection. The lowest concentration of SARS-CoV-2 viral copies this assay can detect is 138 copies/mL. A negative result does not preclude SARS-Cov-2 infection and should not be used as the sole basis for treatment or other patient management decisions. A negative result may occur with  improper specimen collection/handling, submission of specimen other than nasopharyngeal swab, presence of viral mutation(s) within the areas targeted by this assay, and inadequate number of viral copies(<138 copies/mL). A negative result must be combined with clinical observations, patient history, and epidemiological information. The expected result is Negative.  Fact Sheet for Patients:  EntrepreneurPulse.com.au  Fact Sheet for Healthcare Providers:  IncredibleEmployment.be  This test is no t yet approved or cleared by the [place] FDA and  has been authorized for detection and/or diagnosis of SARS-CoV-2 by FDA under an Emergency Use Authorization (EUA). This EUA will remain  in effect (meaning this test can be used) for the duration of the COVID-19 declaration under Section 564(b)(1) of the Act, 21 U.S.C.section 360bbb-3(b)(1), unless the authorization is terminated  or revoked sooner.       Influenza A by PCR NEGATIVE NEGATIVE Final   Influenza B by PCR NEGATIVE NEGATIVE Final    Comment: (NOTE) The Xpert Xpress SARS-CoV-2/FLU/RSV plus assay is intended as an aid in the diagnosis of influenza from Nasopharyngeal swab specimens and should not  be used as a sole basis for treatment. Nasal washings and aspirates are unacceptable for Xpert Xpress SARS-CoV-2/FLU/RSV testing.  Fact Sheet for Patients: EntrepreneurPulse.com.au  Fact Sheet for Healthcare Providers: IncredibleEmployment.be  This test is not yet approved or cleared by the [place] FDA and has been authorized for detection and/or diagnosis of SARS-CoV-2 by FDA under an Emergency Use Authorization (EUA). This EUA will remain in effect (meaning this test can be used) for the duration of the COVID-19 declaration under Section 564(b)(1) of the Act, 21 U.S.C. section 360bbb-3(b)(1), unless the authorization is terminated or revoked.     Resp Syncytial Virus by PCR NEGATIVE NEGATIVE Final    Comment: (NOTE) Fact Sheet for Patients: EntrepreneurPulse.com.au  Fact Sheet for Healthcare Providers: IncredibleEmployment.be  This test is not yet approved or cleared by the [place] FDA and has been authorized for detection and/or diagnosis of  SARS-CoV-2 by FDA under an Emergency Use Authorization (EUA). This EUA will remain in effect (meaning this test can be used) for the duration of the COVID-19 declaration under Section 564(b)(1) of the Act, 21 U.S.C. section 360bbb-3(b)(1), unless the authorization is terminated or revoked.  Performed at [facility], [address], [place], [place] [address]          Radiology Studies: ECHOCARDIOGRAM COMPLETE  Result Date: 05/05/2022    ECHOCARDIOGRAM REPORT   Patient Name:   [name] [name] Date of Exam: 05/05/2022 Medical Rec #:  [id number]        Height:       59.0 in Accession #:    [id number]       Weight:       166.0 lb Date of Birth:  10-Mar-1925        BSA:          1.704 m Patient Age:    89 years         BP:           120/79 mmHg Patient Gender: F                HR:           91 bpm. Exam Location:  Inpatient Procedure:  2D Echo, 3D Echo, Cardiac Doppler and Color Doppler Indications:    TIA G45.9  History:        Patient has prior history of Echocardiogram examinations, most                 recent 10/20/2021. COPD and PAD, Arrythmias:Atrial Fibrillation;                 Risk Factors:Former Smoker, Hypertension and Dyslipidemia.  Sonographer:    Darlina Sicilian RDCS Referring Phys: [place]  1. Left ventricular ejection fraction, by estimation, is 65 to 70%. The left ventricle has normal function. The left ventricle has no regional wall motion abnormalities. Left ventricular diastolic parameters are indeterminate.  2. Right ventricular systolic function is low normal. The right ventricular size is normal. There is normal pulmonary artery systolic pressure.  3. The mitral valve is degenerative. Trivial mitral valve regurgitation. Mild mitral stenosis. The mean mitral valve gradient is 2.0 mmHg.  4. The aortic valve is calcified. Aortic valve regurgitation is not visualized. Aortic valve sclerosis/calcification is present, without any evidence of aortic stenosis. Aortic valve mean gradient measures 4.5 mmHg. Aortic valve Vmax measures 1.43 m/s.  5. The inferior vena cava is normal in size with greater than 50% respiratory variability, suggesting right atrial pressure of 3 mmHg. FINDINGS  Left Ventricle: Left ventricular ejection fraction, by estimation, is 65 to 70%. The left ventricle has normal function. The left ventricle has no regional wall motion abnormalities. The left ventricular internal cavity size was normal in size. There is  no left ventricular hypertrophy. Left ventricular diastolic parameters are indeterminate. Right Ventricle: The right ventricular size is normal. No increase in right ventricular wall thickness. Right ventricular systolic function is low normal. There is normal pulmonary artery systolic pressure. The tricuspid regurgitant velocity is 2.59 m/s,  and with an assumed right atrial  pressure of 3 mmHg, the estimated right ventricular systolic pressure is 84.1 mmHg. Left Atrium: Left atrial size was normal in size. Right Atrium: Right atrial size was normal in size. Pericardium: There is no evidence of pericardial effusion. Presence of epicardial fat layer. Mitral Valve: The mitral valve is degenerative in  appearance. There is moderate thickening of the mitral valve leaflet(s). There is moderate calcification of the mitral valve leaflet(s). Moderately decreased mobility of the mitral valve leaflets. Mild to  moderate mitral annular calcification. Trivial mitral valve regurgitation. Mild mitral valve stenosis. MV peak gradient, 6.3 mmHg. The mean mitral valve gradient is 2.0 mmHg. Tricuspid Valve: The tricuspid valve is grossly normal. Tricuspid valve regurgitation is mild . No evidence of tricuspid stenosis. Aortic Valve: The aortic valve is calcified. Aortic valve regurgitation is not visualized. Aortic valve sclerosis/calcification is present, without any evidence of aortic stenosis. Aortic valve mean gradient measures 4.5 mmHg. Aortic valve peak gradient measures 8.2 mmHg. Pulmonic Valve: The pulmonic valve was not well visualized. Pulmonic valve regurgitation is not visualized. No evidence of pulmonic stenosis. Aorta: The aortic root and ascending aorta are structurally normal, with no evidence of dilitation. Venous: The inferior vena cava is normal in size with greater than 50% respiratory variability, suggesting right atrial pressure of 3 mmHg. IAS/Shunts: No atrial level shunt detected by color flow Doppler.  LEFT VENTRICLE PLAX 2D LVIDd:         4.20 cm   Diastology LVIDs:         2.00 cm   LV e' medial:    3.87 cm/s LV PW:         0.90 cm   LV E/e' medial:  34.2 LV IVS:        1.00 cm   LV e' lateral:   6.55 cm/s LVOT diam:     1.60 cm   LV E/e' lateral: 20.2 LVOT Area:     2.01 cm                           3D Volume EF:                          3D EF:        54 %                           LV EDV:       53 ml                          LV ESV:       24 ml                          LV SV:        29 ml RIGHT VENTRICLE RV S prime:     8.11 cm/s TAPSE (M-mode): 1.2 cm LEFT ATRIUM             Index        RIGHT ATRIUM           Index LA diam:        4.20 cm 2.46 cm/m   RA Area:     11.00 cm LA Vol (A2C):   38.1 ml 22.36 ml/m  RA Volume:   19.70 ml  11.56 ml/m LA Vol (A4C):   45.2 ml 26.53 ml/m LA Biplane Vol: 43.5 ml 25.53 ml/m  AORTIC VALVE AV Vmax:      143.50 cm/s AV Vmean:     100.170 cm/s AV VTI:       0.302 m AV Peak Grad: 8.2 mmHg AV Mean Grad: 4.5 mmHg  AORTA Ao Root diam: 3.30 cm Ao Asc diam:  3.20 cm MITRAL VALVE                TRICUSPID VALVE MV Area (PHT): 4.12 cm     TR Peak grad:   26.8 mmHg MV Peak grad:  6.3 mmHg     TR Vmax:        259.00 cm/s MV Mean grad:  2.0 mmHg MV Vmax:       1.25 m/s     SHUNTS MV Vmean:      61.5 cm/s    Systemic Diam: 1.60 cm MV Decel Time: 184 msec MV E velocity: 132.33 cm/s Dorris Carnes MD Electronically signed by Dorris Carnes MD Signature Date/Time: 05/05/2022/9:19:35 AM    Final    MR BRAIN WO CONTRAST  Result Date: 05/05/2022 CLINICAL DATA:  Initial evaluation for dizziness. EXAM: MRI HEAD WITHOUT CONTRAST TECHNIQUE: Multiplanar, multiecho pulse sequences of the brain and surrounding structures were obtained without intravenous contrast. COMPARISON:  Prior CT from earlier the same day. FINDINGS: Brain: Cerebral volume within normal limits. Mild scattered patchy T2/FLAIR hyperintensity noted involving the periventricular and deep white matter both cerebral hemispheres, overall mild in nature, and less than is typically seen for age. No evidence for acute or subacute ischemia. Note made of a tiny apparent punctate focus of diffusion signal abnormality at the right ventral cervicomedullary junction on axial DWI sequence (series 2, image 7), not seen on corresponding sequences, and favored to be artifactual. Gray-white matter differentiation maintained. No  areas of chronic cortical infarction. No acute or chronic intracranial blood products. No mass lesion, midline shift or mass effect no hydrocephalus or extra-axial fluid collection. Pituitary gland and suprasellar region within normal limits. Vascular: Major intracranial vascular flow voids are maintained. Skull and upper cervical spine: Craniocervical junction normal. Bone marrow signal intensity within normal limits. No scalp soft tissue abnormality. Sinuses/Orbits: Prior bowel ocular lens replacement. Paranasal sinuses are largely clear. Trace left mastoid effusion noted, of doubtful significance. Other: None. IMPRESSION: Normal brain MRI for age.  No acute intracranial abnormality. Electronically Signed   By: Jeannine Boga M.D.   On: 05/05/2022 01:38   CT Head Wo Contrast  Result Date: 05/04/2022 CLINICAL DATA:  Worsening dizziness, nausea and weakness when standing. EXAM: CT HEAD WITHOUT CONTRAST TECHNIQUE: Contiguous axial images were obtained from the base of the skull through the vertex without intravenous contrast. RADIATION DOSE REDUCTION: This exam was performed according to the departmental dose-optimization program which includes automated exposure control, adjustment of the mA and/or kV according to patient size and/or use of iterative reconstruction technique. COMPARISON:  None Available. FINDINGS: Brain: No evidence of acute infarction, hemorrhage, hydrocephalus, extra-axial collection or mass lesion/mass effect. There is mild cerebral volume loss with associated ex vacuo dilatation. Periventricular white matter hypoattenuation likely represents chronic small vessel ischemic disease. Vascular: There are vascular calcifications in the carotid siphons. Skull: Normal. Negative for fracture or focal lesion. Sinuses/Orbits: No acute finding. Other: None. IMPRESSION: No acute intracranial process. Electronically Signed   By: Zerita Boers M.D.   On: 05/04/2022 12:36        Scheduled  Meds:  [START ON 05/06/2022]  stroke: early stages of recovery book   Does not apply Once   apixaban  5 mg Oral BID   atorvastatin  40 mg Oral Daily   meclizine  25 mg Oral Q8H   pantoprazole  40 mg Oral Daily   Continuous Infusions:  lactated ringers  LOS: 0 days    Time spent: 45 minutes spent on chart review, discussion with nursing staff, consultants, updating family and interview/physical exam; more than 50% of that time was spent in counseling and/or coordination of care.    Geradine Girt, DO [facility] Available via Epic secure chat 7am-7pm After these hours, please refer to coverage provider listed on [email or web address] 05/05/2022, 10:01 AM

## 2022-05-05 NOTE — Evaluation (Addendum)
Physical Therapy Evaluation Patient Details Name: [name] [name] MRN: [id number] DOB: December 13, 1924 Today's Date: 05/05/2022  History of Present Illness  Pt reports that she was initially mildly dizzy but as the days progressed her symptoms were getting worse. On 1/10 she woke up and was extremely dizzy she could not stand up becuase the world was spinning. She could not focus her eyes. Her daughter came over and pt vomited. When she moves her head she becomes dizzy. Pt was taken to ER and was given Zofran which helped. Orthostatics were positive and Dix-Hallpike was positive. Pt has PMH COPD, HF, A fib, Hyperlipidemia, HTN.  Clinical Impression  Pt presents with vertigo that increases with head movements and position changes. Pt was positive for L horizontal canal testing; treated with L BBQ Roll and improvement in symptoms. On sitting pt had nystagmus without directional preference and checked L posterior canal with positive nystagmus though less time and less severe symptoms that L horizontal canal. Treated  the L posterior canal with Epley Maneuver; pt had significant decrease in symptoms after treating both canals. Pt went to recline in bed and laid completely flat with a quick return of symptoms. Pt was very fatigued so placed at 45 degree angle and encouraged to stay in this position. Improved symptoms quickly and pt was able to move her head more freely by end of session. Pt will benefit from a second treatment session to check for clearing of the L posterior and horizontal canal. Functional pt moves well with good strength she is currently limited by vertigo. At this time recommending HHPT vestibular therapist on discharge from acute care hospital setting in order to decrease risk for falls, injury, immobility and re-hospitalization.      Vestibular Assessment - 05/05/22 0001       Oculomotor Exam-Fixation Suppressed    Spontaneous Nystagmus not present    Gaze evoked nystagmus negative     Left Head Impulse Positive w/nystagmus    Right Head Impulse positive with nystagmus fast beat toward L    Head Shaking Nystagmus-Horizontal Positive nystagmus with L beat      Vestibulo-Ocular Reflex   VOR 1 Head Only (x 1 viewing) Positive nystagmus worse L than R    VOR to Slow Head Movement Positive left      Positional Testing   Dix-Hallpike Dix-Hallpike Left    Horizontal Canal Testing Horizontal Canal Left      Dix-Hallpike Left   Dix-Hallpike Left Duration 45 seconds    Dix-Hallpike Left Symptoms Left nystagmus      Horizontal Canal Left   Horizontal Canal Left Duration 56 seconds    Horizontal Canal Left Symptoms Nystagmus;Geotrophic               Recommendations for follow up therapy are one component of a multi-disciplinary discharge planning process, led by the attending physician.  Recommendations may be updated based on patient status, additional functional criteria and insurance authorization.  Follow Up Recommendations Outpatient PT (vestibular therapist)      Assistance Recommended at Discharge Frequent or constant Supervision/Assistance  Patient can return home with the following  A little help with walking and/or transfers;Assist for transportation;Help with stairs or ramp for entrance    Equipment Recommendations None recommended by PT  Recommendations for Other Services       Functional Status Assessment Patient has had a recent decline in their functional status and demonstrates the ability to make significant improvements in function in a reasonable and predictable amount  of time.     Precautions / Restrictions Precautions Precautions: Fall Restrictions Weight Bearing Restrictions: No      Mobility  Bed Mobility Overal bed mobility: Needs Assistance Bed Mobility: Supine to Sit, Sit to Supine     Supine to sit: Min assist Sit to supine: Min guard   General bed mobility comments: Due to vertigo Patient Response:  Cooperative  Transfers Overall transfer level: Needs assistance Equipment used: Rolling walker (2 wheels) Transfers: Sit to/from Stand Sit to Stand: Min guard           General transfer comment: Due to vertigo    Ambulation/Gait   General Gait Details: Did not attempt today due to vertigo       Balance Overall balance assessment: Needs assistance Sitting-balance support: Bilateral upper extremity supported, Feet supported Sitting balance-Leahy Scale: Fair Sitting balance - Comments: impaired initially due to vertigo until symptoms subside.   Standing balance support: Bilateral upper extremity supported Standing balance-Leahy Scale: Fair Standing balance comment: improved to CGA after Epley and BBQ roll for the L, slightly off balance still requiring CGA for safety           Pertinent Vitals/Pain Pain Assessment Pain Assessment: No/denies pain    Home Living Family/patient expects to be discharged to:: Private residence Living Arrangements: Alone Available Help at Discharge: Family;Available PRN/intermittently Type of Home: House Home Access: Stairs to enter   [employer] of Steps: 1 and 1 railing at step porch and can hold onto the door frame when stepping into the house   Home Layout: One level Home Equipment: Shower seat - built in;Grab bars - tub/shower;Hand held Engineering geologist (2 wheels);Cane - single point;Rollator (4 wheels)      Prior Function Prior Level of Function : Independent/Modified Independent             Mobility Comments: 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       Hand Dominance   Dominant Hand: Right    Extremity/Trunk Assessment   Upper Extremity Assessment Upper Extremity Assessment: Overall WFL for tasks assessed    Lower Extremity Assessment Lower Extremity Assessment: Overall WFL for tasks assessed    Cervical / Trunk Assessment Cervical / Trunk Assessment: Normal   Communication   Communication: No difficulties  Cognition Arousal/Alertness: Awake/alert Behavior During Therapy: WFL for tasks assessed/performed Overall Cognitive Status: Within Functional Limits for tasks assessed            General Comments General comments (skin integrity, edema, etc.): Pt has some family support but no one can be with her consistently.    Exercises     Assessment/Plan            PT Assessment Patient needs continued PT services  PT Problem List Decreased mobility;Other (comment) (vertigo)       PT Treatment Interventions DME instruction;Therapeutic exercise;Gait training;Balance training;Stair training;Neuromuscular re-education;Functional mobility training;Therapeutic activities;Patient/family education    PT Goals (Current goals can be found in the Care Plan section)  Acute Rehab PT Goals Patient Stated Goal: Decrease dizziness PT Goal Formulation: With patient Time For Goal Achievement: 05/19/22 Potential to Achieve Goals: Good    Frequency Min 3X/week        AM-PAC PT "6 Clicks" Mobility  Outcome Measure Help needed turning from your back to your side while in a flat bed without using bedrails?: A Little Help needed moving from lying on your back to sitting on the side of a flat bed  without using bedrails?: A Little Help needed moving to and from a bed to a chair (including a wheelchair)?: A Little Help needed standing up from a chair using your arms (e.g., wheelchair or bedside chair)?: A Little Help needed to walk in hospital room?: A Little Help needed climbing 3-5 steps with a railing? : A Little 6 Click Score: 18    End of Session   Activity Tolerance: Patient tolerated treatment well Patient left: in bed;with bed alarm set;with family/visitor present;with call bell/phone within reach Nurse Communication: Mobility status PT Visit Diagnosis: Unsteadiness on feet (R26.81);Dizziness and giddiness (R42);BPPV BPPV - Right/Left :  Left    Time: 8786-7672 PT Time Calculation (min) (ACUTE ONLY): 63 min   Charges:   PT Evaluation $PT Eval Moderate Complexity: 1 Mod PT Treatments $Therapeutic Activity: 8-22 mins $Canalith Rep Proc: 23-37 mins       Tomma Rakers, DPT, CLT  Acute Rehabilitation Services Office: [phone] (Secure chat preferred)   Ander Purpura 05/05/2022, 4:22 PM

## 2022-05-05 NOTE — Plan of Care (Signed)
  Problem: Education: Goal: Knowledge of General Education information will improve Description: Including pain rating scale, medication(s)/side effects and non-pharmacologic comfort measures Outcome: Progressing   Problem: Health Behavior/Discharge Planning: Goal: Ability to manage health-related needs will improve Outcome: Progressing   Problem: Clinical Measurements: Goal: Ability to maintain clinical measurements within normal limits will improve Outcome: Progressing Goal: Will remain free from infection Outcome: Progressing Goal: Diagnostic test results will improve Outcome: Progressing Goal: Respiratory complications will improve Outcome: Progressing Goal: Cardiovascular complication will be avoided Outcome: Progressing   Problem: Activity: Goal: Risk for activity intolerance will decrease Outcome: Progressing   Problem: Nutrition: Goal: Adequate nutrition will be maintained Outcome: Progressing   Problem: Coping: Goal: Level of anxiety will decrease Outcome: Progressing   Problem: Elimination: Goal: Will not experience complications related to bowel motility Outcome: Progressing Goal: Will not experience complications related to urinary retention Outcome: Progressing   Problem: Pain Managment: Goal: General experience of comfort will improve Outcome: Progressing   Problem: Safety: Goal: Ability to remain free from injury will improve Outcome: Progressing   Problem: Skin Integrity: Goal: Risk for impaired skin integrity will decrease Outcome: Progressing   Problem: Education: Goal: Knowledge of disease or condition will improve Outcome: Progressing Goal: Knowledge of secondary prevention will improve (MUST DOCUMENT ALL) Outcome: Progressing Goal: Knowledge of patient specific risk factors will improve Elta [place] N/A or DELETE if not current risk factor) Outcome: Progressing   Problem: Ischemic Stroke/TIA Tissue Perfusion: Goal: Complications of ischemic  stroke/TIA will be minimized Outcome: Progressing   Problem: Coping: Goal: Will verbalize positive feelings about self Outcome: Progressing Goal: Will identify appropriate support needs Outcome: Progressing   Problem: Health Behavior/Discharge Planning: Goal: Ability to manage health-related needs will improve Outcome: Progressing Goal: Goals will be collaboratively established with patient/family Outcome: Progressing   Problem: Self-Care: Goal: Ability to participate in self-care as condition permits will improve Outcome: Progressing Goal: Verbalization of feelings and concerns over difficulty with self-care will improve Outcome: Progressing Goal: Ability to communicate needs accurately will improve Outcome: Progressing   Problem: Nutrition: Goal: Risk of aspiration will decrease Outcome: Progressing Goal: Dietary intake will improve Outcome: Progressing

## 2022-05-05 NOTE — Plan of Care (Signed)
Problem: Education: Goal: Knowledge of General Education information will improve Description: Including pain rating scale, medication(s)/side effects and non-pharmacologic comfort measures Outcome: Progressing   Problem: Health Behavior/Discharge Planning: Goal: Ability to manage health-related needs will improve Outcome: Progressing   Problem: Clinical Measurements: Goal: Will remain free from infection Outcome: Progressing Goal: Respiratory complications will improve Outcome: Progressing   Problem: Nutrition: Goal: Adequate nutrition will be maintained Outcome: Progressing   Problem: Elimination: Goal: Will not experience complications related to bowel motility Outcome: Progressing Goal: Will not experience complications related to urinary retention Outcome: Progressing   Problem: Safety: Goal: Ability to remain free from injury will improve Outcome: Progressing   Problem: Skin Integrity: Goal: Risk for impaired skin integrity will decrease Outcome: Progressing   Problem: Nutrition: Goal: Risk of aspiration will decrease Outcome: Progressing Goal: Dietary intake will improve Outcome: Progressing

## 2022-05-05 NOTE — Evaluation (Signed)
Clinical/Bedside Swallow Evaluation Patient Details  Name: [name] [name] MRN: [id number] Date of Birth: 10-17-1924  Today's Date: 05/05/2022 Time: SLP Start Time (ACUTE ONLY): 0905 SLP Stop Time (ACUTE ONLY): 0925 SLP Time Calculation (min) (ACUTE ONLY): 20 min  Past Medical History:  Past Medical History:  Diagnosis Date   Acute on chronic diastolic heart failure ([place]) 12/30/2020   Allergic rhinoconjunctivitis    Allergy    Anxiety    pt. managed- uses deep breathing    Arthritis    low back , stenosis   COLONIC POLYPS, RECURRENT 08/29/2006   2008 last colonoscopy. No further colonoscopy.      COPD (chronic obstructive pulmonary disease) (HCC)    Diverticulosis    Dysrhythmia    afib   Eczema    Environmental allergies    allergy shot- q friday in Dr. Janee Morn office. PFT's abnormal- recommended Spiriva to use preop & will d/c after surgery   GERD (gastroesophageal reflux disease)    Hiatal hernia    Hyperlipidemia    Hypertension    Lung nodule 2011   Paroxysmal atrial fibrillation ([place]) 11/27/2019   Peripheral vascular disease (HCC)    Shingles    Stenosis of popliteal artery (HCC)    blood clots in legs long ago     Trigeminal neuralgia    Left buttocks   Past Surgical History:  Past Surgical History:  Procedure Laterality Date   ABDOMINAL AORTAGRAM N/A 06/17/2011   Procedure: ABDOMINAL Maxcine Ham;  Surgeon: Elam Dutch, MD;  Location: [facility] CATH LAB;  Service: Cardiovascular;  Laterality: N/A;   ABDOMINAL AORTOGRAM W/LOWER EXTREMITY Bilateral 06/22/2018   Procedure: ABDOMINAL AORTOGRAM W/LOWER EXTREMITY;  Surgeon: Elam Dutch, MD;  Location: [facility] CV LAB;  Service: Cardiovascular;  Laterality: Bilateral;   ABDOMINAL AORTOGRAM W/LOWER EXTREMITY Bilateral 11/29/2019   Procedure: ABDOMINAL AORTOGRAM W/LOWER EXTREMITY;  Surgeon: Elam Dutch, MD;  Location: [facility] CV LAB;  Service: Cardiovascular;  Laterality: Bilateral;   CATARACT EXTRACTION, BILATERAL      w IOL   CHOLECYSTECTOMY  1998   COLONOSCOPY     ENDARTERECTOMY FEMORAL Left 12/09/2019   Procedure: ENDARTERECTOMY FEMORAL with bovine patch angioplasty.;  Surgeon: Elam Dutch, MD;  Location: [facility];  Service: Vascular;  Laterality: Left;   FEMORAL-POPLITEAL BYPASS GRAFT        x2 surgeries 1990's & 2009   FEMORAL-POPLITEAL BYPASS GRAFT Right 12/09/2019   Procedure: REDO RIGHT FEMORAL-POPLITEAL ARTERY BYPASS GRAFT;  Surgeon: Elam Dutch, MD;  Location: [facility] OR;  Service: Vascular;  Laterality: Right;   INJECTION KNEE Right Aug. 2016   Gel injection for pain   LUMBAR FUSION  07/06/2011   TONSILLECTOMY     as a teenager    TUBAL LIGATION     HPI:  This is a 89 year old female with past medical history chronic diastolic heart failure, COPD, atrial fibrillation, hyperlipidemia, and hypertension.  Pt presents with ongoing and worsening dizziness.  MRI was unremarkable for acute abnormality.    Assessment / Plan / Recommendation  Clinical Impression  Pt was seen for a bedside swallow evaluation and she presents with suspected functional oropharyngeal swallowing abilities.  Pt was encountered awake/alert with son at bedside.  Oral mechanism exam was WNL.  Pt consumed trials of thin liquid, puree, and regular solids.  Pt fed herself independently and she demonstrated good bolus acceptance, timely mastication, suspected timely AP transport/swallow initiation, and consistent hyolaryngeal elevation/excursion to observation and palpation.  No overt s/sx of  aspiration were observed with any trials.  Recommend initiation of regular solids and thin liquids with medications administered whole with liquid.  Of note, pt reported chronic, intermittent globus sensation and coughing with solid intake that appears to be related to esophageal function vs pharyngeal.  Discussed completing a barium swallow study to further evaluate; however, given that symptoms only occur intermittently and are not impacting  her daily quality of life, it was determined between the pt, son and SLP that it was not necessary at this time.  Pt may wish to pursue in the future if globus sensation or other clinical symptoms worsen.  No further skilled ST is warranted at this time.  Please re-consult if additional needs arise.    SLP Visit Diagnosis: Dysphagia, unspecified (R13.10)    Aspiration Risk  Mild aspiration risk    Diet Recommendation Regular;Thin liquid   Liquid Administration via: Cup;Straw Medication Administration: Whole meds with liquid Supervision: Patient able to self feed Compensations: Minimize environmental distractions;Small sips/bites;Follow solids with liquid;Slow rate Postural Changes: Seated upright at 90 degrees;Remain upright for at least 30 minutes after po intake    Other  Recommendations Recommended Consults: Consider esophageal assessment Oral Care Recommendations: Oral care BID    Recommendations for follow up therapy are one component of a multi-disciplinary discharge planning process, led by the attending physician.  Recommendations may be updated based on patient status, additional functional criteria and insurance authorization.  Follow up Recommendations No SLP follow up      Assistance Recommended at Discharge    Functional Status Assessment Patient has had a recent decline in their functional status and demonstrates the ability to make significant improvements in function in a reasonable and predictable amount of time.  Frequency and Duration            Prognosis Prognosis for Safe Diet Advancement: Good      Swallow Study   General HPI: This is a 89 year old female with past medical history chronic diastolic heart failure, COPD, atrial fibrillation, hyperlipidemia, and hypertension.  Pt presents with ongoing and worsening dizziness.  MRI was unremarkable for acute abnormality. Type of Study: Bedside Swallow Evaluation Previous Swallow Assessment: MBS 04/30/2012 with  overal functional oropharygneal swallowing abilities Diet Prior to this Study: NPO Temperature Spikes Noted: No Respiratory Status: Room air History of Recent Intubation: No Behavior/Cognition: Alert;Cooperative;Pleasant mood Oral Cavity Assessment: Within Functional Limits Oral Care Completed by SLP: No Oral Cavity - Dentition: Dentures, top;Dentures, bottom Vision: Functional for self-feeding Self-Feeding Abilities: Able to feed self Patient Positioning: Upright in bed Baseline Vocal Quality: Normal Volitional Cough: Strong Volitional Swallow: Able to elicit    Oral/Motor/Sensory Function Overall Oral Motor/Sensory Function: Within functional limits   Ice Chips Ice chips: Not tested   Thin Liquid Thin Liquid: Within functional limits Presentation: Straw    Nectar Thick Nectar Thick Liquid: Not tested   Honey Thick Honey Thick Liquid: Not tested   Puree Puree: Within functional limits Presentation: Self Fed   Solid     Solid: Within functional limits Presentation: [place], M.S., [place] Office: [phone]  [place] 05/05/2022,9:58 AM

## 2022-05-05 NOTE — Evaluation (Signed)
Speech Language Pathology Evaluation Patient Details Name: [name] [name] MRN: [id number] DOB: [birth year] Today's Date: 05/05/2022 Time: 6295-2841 SLP Time Calculation (min) (ACUTE ONLY): 24 min  Problem List:  Patient Active Problem List   Diagnosis Date Noted   Vertigo 05/04/2022   Mild sleep apnea 05/03/2021   Chronic diastolic heart failure ([place]) 12/30/2020   Left groin hernia 05/22/2020   Anxiety state    Rectal bleeding    Ischemic colitis ([place])    Status post femoral-popliteal bypass surgery 12/09/2019   Persistent atrial fibrillation ([place]) 12/03/2019   Secondary hypercoagulable state ([place]) 12/03/2019   Chronic bilateral low back pain with right-sided sciatica 08/29/2019   Gross hematuria 06/01/2018   CKD (chronic kidney disease), stage III ([place]) 08/07/2017   Unilateral primary osteoarthritis, right knee 04/07/2016   Acute gout of left foot 03/22/2016   Pain in right ankle and joints of right foot 08/28/2014   Trigeminal neuralgia    Syncope 12/29/2011   Chronic low back pain 09/13/2010   Allergic rhinitis due to pollen 06/25/2010   COPD mixed type ([place]) 01/22/2010   Solitary pulmonary nodule 01/12/2010   Insulin resistance 01/06/2009   Osteopenia 01/06/2009   Eczema 11/26/2007   Peripheral arterial disease ([place]) 06/29/2007   Hyperlipidemia 10/23/2006   Essential hypertension 10/23/2006   ESOPHAGEAL STRICTURE 08/09/1993   Past Medical History:  Past Medical History:  Diagnosis Date   Acute on chronic diastolic heart failure ([place]) 12/30/2020   Allergic rhinoconjunctivitis    Allergy    Anxiety    pt. managed- uses deep breathing    Arthritis    low back , stenosis   COLONIC POLYPS, RECURRENT 08/29/2006   2008 last colonoscopy. No further colonoscopy.      COPD (chronic obstructive pulmonary disease) (HCC)    Diverticulosis    Dysrhythmia    afib   Eczema    Environmental allergies    allergy shot- q friday in Dr. Janee Morn office. PFT's abnormal- recommended  Spiriva to use preop & will d/c after surgery   GERD (gastroesophageal reflux disease)    Hiatal hernia    Hyperlipidemia    Hypertension    Lung nodule 2011   Paroxysmal atrial fibrillation ([place]) 11/27/2019   Peripheral vascular disease (HCC)    Shingles    Stenosis of popliteal artery (HCC)    blood clots in legs long ago     Trigeminal neuralgia    Left buttocks   Past Surgical History:  Past Surgical History:  Procedure Laterality Date   ABDOMINAL AORTAGRAM N/A 06/17/2011   Procedure: ABDOMINAL Maxcine Ham;  Surgeon: Elam Dutch, MD;  Location: [facility] CATH LAB;  Service: Cardiovascular;  Laterality: N/A;   ABDOMINAL AORTOGRAM W/LOWER EXTREMITY Bilateral 06/22/2018   Procedure: ABDOMINAL AORTOGRAM W/LOWER EXTREMITY;  Surgeon: Elam Dutch, MD;  Location: [facility] CV LAB;  Service: Cardiovascular;  Laterality: Bilateral;   ABDOMINAL AORTOGRAM W/LOWER EXTREMITY Bilateral 11/29/2019   Procedure: ABDOMINAL AORTOGRAM W/LOWER EXTREMITY;  Surgeon: Elam Dutch, MD;  Location: [facility] CV LAB;  Service: Cardiovascular;  Laterality: Bilateral;   CATARACT EXTRACTION, BILATERAL     w IOL   CHOLECYSTECTOMY  1998   COLONOSCOPY     ENDARTERECTOMY FEMORAL Left 12/09/2019   Procedure: ENDARTERECTOMY FEMORAL with bovine patch angioplasty.;  Surgeon: Elam Dutch, MD;  Location: [facility] OR;  Service: Vascular;  Laterality: Left;   FEMORAL-POPLITEAL BYPASS GRAFT        x2 surgeries 1990's & 2009   FEMORAL-POPLITEAL BYPASS GRAFT  Right 12/09/2019   Procedure: REDO RIGHT FEMORAL-POPLITEAL ARTERY BYPASS GRAFT;  Surgeon: Elam Dutch, MD;  Location: [facility] OR;  Service: Vascular;  Laterality: Right;   INJECTION KNEE Right Aug. 2016   Gel injection for pain   LUMBAR FUSION  07/06/2011   TONSILLECTOMY     as a teenager    TUBAL LIGATION     HPI:  This is a 89 year old female with past medical history chronic diastolic heart failure, COPD, atrial fibrillation, hyperlipidemia, and  hypertension.  Pt presents with ongoing and worsening dizziness.  MRI was unremarkable for acute abnormality.   Assessment / Plan / Recommendation Clinical Impression  Pt presents with functional cognitive-linguistic abilities.  Pt lives alone and she is independent with IADLs at baseline including medication and financial management.  Pt completed informal evaluation measures and did not exhibit difficulty with any cognitive tasks.  Expressive and receptive language were functional and no dysarthria was observed.  Pt's son was at bedside and reported that she is at her cognitive baseline.  MRI was additionally negative for acute changes.  No further skilled ST is warranted at this time, please re-consult if additional needs arise.    SLP Assessment  SLP Recommendation/Assessment: Patient does not need any further Speech [place] [facility] SLP Visit Diagnosis: Cognitive communication deficit (R41.841)    Recommendations for follow up therapy are one component of a multi-disciplinary discharge planning process, led by the attending physician.  Recommendations may be updated based on patient status, additional functional criteria and insurance authorization.    Follow Up Recommendations  No SLP follow up    Assistance Recommended at Discharge  PRN  Functional Status Assessment Patient has had a recent decline in their functional status and demonstrates the ability to make significant improvements in function in a reasonable and predictable amount of time.  Frequency and Duration           SLP Evaluation Cognition  Overall Cognitive Status: Within Functional Limits for tasks assessed Arousal/Alertness: Awake/alert Orientation Level: Oriented X4       Comprehension  Auditory Comprehension Overall Auditory Comprehension: Appears within functional limits for tasks assessed    Expression Expression Primary Mode of Expression: Verbal Verbal Expression Overall Verbal Expression:  Appears within functional limits for tasks assessed   Oral / Motor  Oral Motor/Sensory Function Overall Oral Motor/Sensory Function: Within functional limits Motor Speech Overall Motor Speech: Appears within functional limits for tasks assessed           Bretta Bang, M.S., [place] Office: [phone]  [place] 05/05/2022, 10:10 AM

## 2022-05-05 NOTE — Progress Notes (Signed)
OT Screenn Note  Patient Details Name: [name] [name] MRN: [id number] DOB: [birth year]   Cancelled Treatment:    Reason Eval/Treat Not Completed: OT screened, no needs identified, will sign off. Met with patient who lives alone and is typically independent with all ADL tasks. Due to recent vertigo, pt is unable to complete ADL tasks with some assistance. Recommended having a friend or family member stay with her for a night or two when discharged home. At this time, no acute OT needs are identified. Recommended continuing with Vestibular therapy with acute PT and request OT referral if any needs are identified at home. Pt and family requesting Outpatient PT versus HHPT. OT communicated this request to evaluating PT.  Ailene Ravel, OTR/L,CBIS  Supplemental OT - [facility] and [facility] Secure Chat Preferred   05/05/2022, 4:41 PM

## 2022-05-05 NOTE — Progress Notes (Signed)
  Echocardiogram 2D Echocardiogram has been performed.  Darlina Sicilian M 05/05/2022, 9:02 AM

## 2022-05-06 DIAGNOSIS — R42 Dizziness and giddiness: Secondary | ICD-10-CM | POA: Diagnosis not present

## 2022-05-06 LAB — CBC
HCT: 37.6 % (ref 36.0–46.0)
Hemoglobin: 12.6 g/dL (ref 12.0–15.0)
MCH: 29.3 pg (ref 26.0–34.0)
MCHC: 33.5 g/dL (ref 30.0–36.0)
MCV: 87.4 fL (ref 80.0–100.0)
Platelets: 121 10*3/uL — ABNORMAL LOW (ref 150–400)
RBC: 4.3 MIL/uL (ref 3.87–5.11)
RDW: 13.6 % (ref 11.5–15.5)
WBC: 7.6 10*3/uL (ref 4.0–10.5)
nRBC: 0 % (ref 0.0–0.2)

## 2022-05-06 LAB — BASIC METABOLIC PANEL
Anion gap: 9 (ref 5–15)
BUN: 14 mg/dL (ref 8–23)
CO2: 28 mmol/L (ref 22–32)
Calcium: 8.8 mg/dL — ABNORMAL LOW (ref 8.9–10.3)
Chloride: 103 mmol/L (ref 98–111)
Creatinine, Ser: 0.92 mg/dL (ref 0.44–1.00)
GFR, Estimated: 57 mL/min — ABNORMAL LOW (ref >60)
Glucose, Bld: 104 mg/dL — ABNORMAL HIGH (ref 70–99)
Potassium: 3.8 mmol/L (ref 3.5–5.1)
Sodium: 140 mmol/L (ref 135–145)

## 2022-05-06 LAB — LIPID PANEL
Cholesterol: 106 mg/dL (ref 0–200)
HDL: 46 mg/dL (ref >40)
LDL Cholesterol: 37 mg/dL (ref 0–99)
Total CHOL/HDL Ratio: 2.3 RATIO
Triglycerides: 116 mg/dL (ref <150)
VLDL: 23 mg/dL (ref 0–40)

## 2022-05-06 MED ORDER — MELATONIN 3 MG PO TABS
6.0000 mg | ORAL_TABLET | Freq: Every evening | ORAL | Status: DC | PRN
  Administered 2022-05-06: 6 mg via ORAL
  Filled 2022-05-06: qty 2

## 2022-05-06 MED ORDER — METOPROLOL SUCCINATE ER 25 MG PO TB24: 25.0000 mg | ORAL_TABLET | Freq: Once | ORAL | Status: DC

## 2022-05-06 MED ORDER — METOPROLOL SUCCINATE ER 50 MG PO TB24
50.0000 mg | ORAL_TABLET | Freq: Every day | ORAL | Status: DC
  Administered 2022-05-07: 50 mg via ORAL
  Filled 2022-05-06: qty 1

## 2022-05-06 NOTE — TOC Initial Note (Signed)
Transition of Care [facility]) - Initial/Assessment Note    Patient Details  Name: [name] [name] MRN: [id number] Date of Birth: May 29, 1924  [facility] CM/SW Contact:    Amador Cunas, [place] Phone Number: 05/06/2022, 1:14 PM  Clinical Narrative: Spoke with pt's dtr [place] PT recommendation for SNF. Pt's dtr reports agreeable to SNF placement for STR and is requesting [facility]. Reviewed SNF placement process and answered questions. Will begin SNF search and f/u with offers as available.   Wandra Feinstein, MSW, LCSW [phone] (coverage)                    Expected Discharge Plan: [place] Barriers to Discharge: Continued Medical Work up, SNF Pending bed offer   Patient Goals and CMS Choice     Choice offered to / list presented to : Adult Children      Expected Discharge Plan and Services     Post Acute Care Choice: [place] Living arrangements for the past 2 months: Single Family Home                                      Prior Living Arrangements/Services Living arrangements for the past 2 months: Single Family Home Lives with:: Self Patient language and need for interpreter reviewed:: No        Need for Family Participation in Patient Care: Yes (Comment) Care giver support system in place?: No (comment)   Criminal Activity/Legal Involvement Pertinent to Current Situation/Hospitalization: No - Comment as needed  Activities of Daily Living Home Assistive Devices/Equipment: Cane (specify quad or straight) ADL Screening (condition at time of admission) Patient's cognitive ability adequate to safely complete daily activities?: Yes Is the patient deaf or have difficulty hearing?: No Does the patient have difficulty seeing, even when wearing glasses/contacts?: No Does the patient have difficulty concentrating, remembering, or making decisions?: No Patient able to express need for assistance with ADLs?:  Yes Does the patient have difficulty dressing or bathing?: No Independently performs ADLs?: Yes (appropriate for developmental age) Does the patient have difficulty walking or climbing stairs?: No Weakness of Legs: None Weakness of Arms/Hands: None  Permission Sought/Granted Permission sought to share information with : Facility Art therapist granted to share information with : Yes, Verbal Permission Granted              Emotional Assessment       Orientation: : Oriented to Self, Oriented to Place, Oriented to  Time, Oriented to Situation Alcohol / Substance Use: Not Applicable Psych Involvement: No (comment)  Admission diagnosis:  Orthostatic hypotension [I95.1] Dizziness [R42] Vertigo [R42] Patient Active Problem List   Diagnosis Date Noted   Dizziness 05/05/2022   Vertigo 05/04/2022   Mild sleep apnea 05/03/2021   Chronic diastolic heart failure ([place]) 12/30/2020   Left groin hernia 05/22/2020   Anxiety state    Rectal bleeding    Ischemic colitis ([place])    Status post femoral-popliteal bypass surgery 12/09/2019   Persistent atrial fibrillation ([place]) 12/03/2019   Secondary hypercoagulable state ([place]) 12/03/2019   Chronic bilateral low back pain with right-sided sciatica 08/29/2019   Gross hematuria 06/01/2018   CKD (chronic kidney disease), stage III ([place]) 08/07/2017   Unilateral primary osteoarthritis, right knee 04/07/2016   Acute gout of left foot 03/22/2016   Pain in right ankle and joints of right foot 08/28/2014   Trigeminal  neuralgia    Syncope 12/29/2011   Chronic low back pain 09/13/2010   Allergic rhinitis due to pollen 06/25/2010   COPD mixed type ([place]) 01/22/2010   Solitary pulmonary nodule 01/12/2010   Insulin resistance 01/06/2009   Osteopenia 01/06/2009   Eczema 11/26/2007   Peripheral arterial disease ([place]) 06/29/2007   Hyperlipidemia 10/23/2006   Essential hypertension 10/23/2006   ESOPHAGEAL STRICTURE 08/09/1993   PCP:   Marin Olp, MD Pharmacy:   [facility] [address] [name] [address] Phone: [phone] Fax: [facility] [address] [name] [address] Phone: [phone] Fax: [phone]     Social Determinants of Health (SDOH) Social History: SDOH Screenings   Food Insecurity: No Food Insecurity (05/04/2022)  Housing: Low Risk  (05/04/2022)  Transportation Needs: No Transportation Needs (05/04/2022)  Utilities: Not At Risk (05/04/2022)  Depression (PHQ2-9): Low Risk  (04/26/2022)  Financial Resource Strain: Low Risk  (02/03/2022)  Physical Activity: Sufficiently Active (02/03/2022)  Social Connections: Moderately Integrated (02/03/2022)  Stress: No Stress Concern Present (02/03/2022)  Tobacco Use: Medium Risk (05/04/2022)   SDOH Interventions:     Readmission Risk Interventions     No data to display

## 2022-05-06 NOTE — NC FL2 (Signed)
[place] LEVEL OF CARE FORM     IDENTIFICATION  Patient Name: [name] [name] Birthdate: 04/24/1925 Sex: female Admission Date (Current Location): 05/04/2022  [facility] and [place] Number:  Herbalist and Address:  [facility] [place], [place] [address] [place] [address], [place], [place] [address]      Provider Number: [id number]  Attending Physician Name and Address:  Geradine Girt, DO  Relative Name and Phone Number:       Current Level of Care: Hospital Recommended Level of Care: [place] Prior Approval Number:    Date Approved/Denied:   PASRR Number: [id number]  Discharge Plan: SNF    Current Diagnoses: Patient Active Problem List   Diagnosis Date Noted   Dizziness 05/05/2022   Vertigo 05/04/2022   Mild sleep apnea 05/03/2021   Chronic diastolic heart failure ([place]) 12/30/2020   Left groin hernia 05/22/2020   Anxiety state    Rectal bleeding    Ischemic colitis ([place])    Status post femoral-popliteal bypass surgery 12/09/2019   Persistent atrial fibrillation ([place]) 12/03/2019   Secondary hypercoagulable state ([place]) 12/03/2019   Chronic bilateral low back pain with right-sided sciatica 08/29/2019   Gross hematuria 06/01/2018   CKD (chronic kidney disease), stage III ([place]) 08/07/2017   Unilateral primary osteoarthritis, right knee 04/07/2016   Acute gout of left foot 03/22/2016   Pain in right ankle and joints of right foot 08/28/2014   Trigeminal neuralgia    Syncope 12/29/2011   Chronic low back pain 09/13/2010   Allergic rhinitis due to pollen 06/25/2010   COPD mixed type ([place]) 01/22/2010   Solitary pulmonary nodule 01/12/2010   Insulin resistance 01/06/2009   Osteopenia 01/06/2009   Eczema 11/26/2007   Peripheral arterial disease ([place]) 06/29/2007   Hyperlipidemia 10/23/2006   Essential hypertension 10/23/2006   ESOPHAGEAL STRICTURE 08/09/1993    Orientation RESPIRATION BLADDER Height & Weight     Self, Time,  Place, Situation  Normal Continent Weight: 166 lb 0.1 oz (75.3 kg) Height:  '4\' 11"'$  (149.9 cm)  BEHAVIORAL SYMPTOMS/MOOD NEUROLOGICAL BOWEL NUTRITION STATUS      Continent    AMBULATORY STATUS COMMUNICATION OF NEEDS Skin   Extensive Assist Verbally Normal                       Personal Care Assistance Level of Assistance  Bathing, Feeding, Dressing Bathing Assistance: Limited assistance Feeding assistance: Limited assistance Dressing Assistance: Limited assistance     Functional Limitations Info  Hearing, Sight, Speech Sight Info: Adequate Hearing Info: Adequate Speech Info: Impaired    SPECIAL CARE FACTORS FREQUENCY  PT (By licensed PT), OT (By licensed OT), Speech therapy                    Contractures Contractures Info: Not present    Additional Factors Info  Code Status Code Status Info: FULL CODE             Current Medications (05/06/2022):  This is the current hospital active medication list Current Facility-Administered Medications  Medication Dose Route Frequency Provider Last Rate Last Admin   acetaminophen (TYLENOL) tablet 650 mg  650 mg Oral Q4H PRN Crosley, Debby, MD       Or   acetaminophen (TYLENOL) 160 MG/5ML solution 650 mg  650 mg Per Tube Q4H PRN Crosley, Debby, MD       Or   acetaminophen (TYLENOL) suppository 650 mg  650 mg Rectal Q4H PRN  Quintella Baton, MD       apixaban Arne Cleveland) tablet 5 mg  5 mg Oral BID Wynetta Fines T, MD   5 mg at 05/06/22 0851   atorvastatin (LIPITOR) tablet 40 mg  40 mg Oral Daily Wynetta Fines T, MD   40 mg at 05/06/22 0851   diazepam (VALIUM) injection 2.5 mg  2.5 mg Intravenous Q8H PRN Quintella Baton, MD   2.5 mg at 05/04/22 2252   labetalol (NORMODYNE) injection 10 mg  10 mg Intravenous Q4H PRN Lequita Halt, MD       meclizine (ANTIVERT) tablet 25 mg  25 mg Oral Q8H Crosley, Debby, MD   25 mg at 05/06/22 8403   metoprolol succinate (TOPROL-XL) 24 hr tablet 25 mg  25 mg Oral Once Vann, Jessica U, DO        [START ON 05/07/2022] metoprolol succinate (TOPROL-XL) 24 hr tablet 50 mg  50 mg Oral Daily Vann, Jessica U, DO       ondansetron (ZOFRAN) injection 4 mg  4 mg Intravenous Q4H PRN Crosley, Debby, MD   4 mg at 05/06/22 0900   pantoprazole (PROTONIX) EC tablet 40 mg  40 mg Oral Daily Wynetta Fines T, MD   40 mg at 05/06/22 0851   senna-docusate (Senokot-S) tablet 1 tablet  1 tablet Oral QHS PRN Quintella Baton, MD         Discharge Medications: Please see discharge summary for a list of discharge medications.  Relevant Imaging Results:  Relevant Lab Results:   Additional Information SS# [id number]  Amador Cunas, [place]

## 2022-05-06 NOTE — Progress Notes (Signed)
PROGRESS NOTE    [name] [name]  LSL:[id number] DOB: Mar 17, 1925 DOA: 05/04/2022 PCP: Marin Olp, MD    Brief Narrative:  Brought in with dizziness and N/V.   Assessment and Plan: Dizziness-- vertigo -MRI negative - orthostatic vitals negative -Meclizine scheduled Q8-- vestibular PT ordered -Zofran as needed for nausea and vomiting   Hyperlipidemia -Stable, atorvastatin resumed   Essential hypertension -Metoprolol --increased dose as able   COPD mixed type (HCC) -Nebulizers as needed   CKD (chronic kidney disease), stage IIIa (HCC) -d/c IVF    Permanent atrial fibrillation (HCC) -Continue Toprol at a lower dose and Eliquis  Dyphagia -SLp eval  GErd -protonix  Obesity Estimated body mass index is 33.53 kg/m as calculated from the following:   Height as of this encounter: '4\' 11"'$  (1.499 m).   Weight as of this encounter: 75.3 kg.   DVT prophylaxis: SCD's Start: 05/05/22 0622 apixaban (ELIQUIS) tablet 5 mg    Code Status: Full Code Family Communication: son at bedside  Disposition Plan:  Level of care: Telemetry Medical Status is: Inpt-- needs SNF placemen  Consultants:  PT vestibular   Subjective: Continue with dizziness  Objective: Vitals:   05/05/22 1237 05/05/22 1954 05/06/22 0821 05/06/22 0851  BP: 130/62 133/86  (!) 141/78  Pulse: 77 91 98 (!) 102  Resp:  18    Temp:  98 F (36.7 C)    TempSrc:  Oral    SpO2:  95% 95%   Weight:      Height:        Intake/Output Summary (Last 24 hours) at 05/06/2022 1251 Last data filed at 05/05/2022 1959 Gross per 24 hour  Intake --  Output 1200 ml  Net -1200 ml   Filed Weights   05/04/22 1135  Weight: 75.3 kg    Examination:   General: Appearance:    Obese female in no acute distress     Lungs:     respirations unlabored  Heart:    Tachycardic. irregular   MS:   All extremities are intact.    Neurologic:   Awake, alert, oriented x 3. No apparent focal neurological            defect.        Data Reviewed: I have personally reviewed following labs and imaging studies  CBC: Recent Labs  Lab 05/04/22 1140 05/05/22 0652 05/06/22 0418  WBC 8.9 7.2 7.6  NEUTROABS 6.5  --   --   HGB 12.6 12.4 12.6  HCT 37.6 35.9* 37.6  MCV 86.8 86.7 87.4  PLT 128* 120* 115*   Basic Metabolic Panel: Recent Labs  Lab 05/04/22 1140 05/05/22 0652 05/06/22 0418  NA 142  --  140  K 3.7  --  3.8  CL 102  --  103  CO2 29  --  28  GLUCOSE 140*  --  104*  BUN 21  --  14  CREATININE 0.96 0.95 0.92  CALCIUM 8.7*  --  8.8*   GFR: Estimated Creatinine Clearance: 32.3 mL/min (by C-G formula based on SCr of 0.92 mg/dL). Liver Function Tests: Recent Labs  Lab 05/04/22 1140  AST 16  ALT 8  ALKPHOS 62  BILITOT 0.8  PROT 6.0*  ALBUMIN 3.9   Recent Labs  Lab 05/04/22 1140  LIPASE 25   No results for input(s): "AMMONIA" in the last 168 hours. Coagulation Profile: No results for input(s): "INR", "PROTIME" in the last 168 hours. Cardiac Enzymes: No results for input(s): "CKTOTAL", "  CKMB", "CKMBINDEX", "TROPONINI" in the last 168 hours. BNP (last 3 results) No results for input(s): "PROBNP" in the last 8760 hours. HbA1C: Recent Labs    05/05/22 0652  HGBA1C 6.0*   CBG: No results for input(s): "GLUCAP" in the last 168 hours. Lipid Profile: Recent Labs    05/06/22 0418  CHOL 106  HDL 46  LDLCALC 37  TRIG 116  CHOLHDL 2.3   Thyroid Function Tests: No results for input(s): "TSH", "T4TOTAL", "FREET4", "T3FREE", "THYROIDAB" in the last 72 hours. Anemia Panel: No results for input(s): "VITAMINB12", "FOLATE", "FERRITIN", "TIBC", "IRON", "RETICCTPCT" in the last 72 hours. Sepsis Labs: No results for input(s): "PROCALCITON", "LATICACIDVEN" in the last 168 hours.  Recent Results (from the past 240 hour(s))  Resp panel by RT-PCR (RSV, Flu A&B, Covid) Anterior Nasal Swab     Status: None   Collection Time: 05/04/22 11:50 AM   Specimen: Anterior Nasal Swab   Result Value Ref Range Status   SARS Coronavirus 2 by RT PCR NEGATIVE NEGATIVE Final    Comment: (NOTE) SARS-CoV-2 target nucleic acids are NOT DETECTED.  The SARS-CoV-2 RNA is generally detectable in upper respiratory specimens during the acute phase of infection. The lowest concentration of SARS-CoV-2 viral copies this assay can detect is 138 copies/mL. A negative result does not preclude SARS-Cov-2 infection and should not be used as the sole basis for treatment or other patient management decisions. A negative result may occur with  improper specimen collection/handling, submission of specimen other than nasopharyngeal swab, presence of viral mutation(s) within the areas targeted by this assay, and inadequate number of viral copies(<138 copies/mL). A negative result must be combined with clinical observations, patient history, and epidemiological information. The expected result is Negative.  Fact Sheet for Patients:  EntrepreneurPulse.com.au  Fact Sheet for Healthcare Providers:  IncredibleEmployment.be  This test is no t yet approved or cleared by the [place] FDA and  has been authorized for detection and/or diagnosis of SARS-CoV-2 by FDA under an Emergency Use Authorization (EUA). This EUA will remain  in effect (meaning this test can be used) for the duration of the COVID-19 declaration under Section 564(b)(1) of the Act, 21 U.S.C.section 360bbb-3(b)(1), unless the authorization is terminated  or revoked sooner.       Influenza A by PCR NEGATIVE NEGATIVE Final   Influenza B by PCR NEGATIVE NEGATIVE Final    Comment: (NOTE) The Xpert Xpress SARS-CoV-2/FLU/RSV plus assay is intended as an aid in the diagnosis of influenza from Nasopharyngeal swab specimens and should not be used as a sole basis for treatment. Nasal washings and aspirates are unacceptable for Xpert Xpress SARS-CoV-2/FLU/RSV testing.  Fact Sheet for  Patients: EntrepreneurPulse.com.au  Fact Sheet for Healthcare Providers: IncredibleEmployment.be  This test is not yet approved or cleared by the [place] FDA and has been authorized for detection and/or diagnosis of SARS-CoV-2 by FDA under an Emergency Use Authorization (EUA). This EUA will remain in effect (meaning this test can be used) for the duration of the COVID-19 declaration under Section 564(b)(1) of the Act, 21 U.S.C. section 360bbb-3(b)(1), unless the authorization is terminated or revoked.     Resp Syncytial Virus by PCR NEGATIVE NEGATIVE Final    Comment: (NOTE) Fact Sheet for Patients: EntrepreneurPulse.com.au  Fact Sheet for Healthcare Providers: IncredibleEmployment.be  This test is not yet approved or cleared by the [place] FDA and has been authorized for detection and/or diagnosis of SARS-CoV-2 by FDA under an Emergency Use Authorization (EUA). This EUA will remain  in effect (meaning this test can be used) for the duration of the COVID-19 declaration under Section 564(b)(1) of the Act, 21 U.S.C. section 360bbb-3(b)(1), unless the authorization is terminated or revoked.  Performed at [facility], [address], [place], [place] [address]          Radiology Studies: ECHOCARDIOGRAM COMPLETE  Result Date: 05/05/2022    ECHOCARDIOGRAM REPORT   Patient Name:   LEASIA SWANN Date of Exam: 05/05/2022 Medical Rec #:  [id number]        Height:       59.0 in Accession #:    [id number]       Weight:       166.0 lb Date of Birth:  02-15-1925        BSA:          1.704 m Patient Age:    89 years         BP:           120/79 mmHg Patient Gender: F                HR:           91 bpm. Exam Location:  Inpatient Procedure: 2D Echo, 3D Echo, Cardiac Doppler and Color Doppler Indications:    TIA G45.9  History:        Patient has prior history of Echocardiogram  examinations, most                 recent 10/20/2021. COPD and PAD, Arrythmias:Atrial Fibrillation;                 Risk Factors:Former Smoker, Hypertension and Dyslipidemia.  Sonographer:    Darlina Sicilian RDCS Referring Phys: [place]  1. Left ventricular ejection fraction, by estimation, is 65 to 70%. The left ventricle has normal function. The left ventricle has no regional wall motion abnormalities. Left ventricular diastolic parameters are indeterminate.  2. Right ventricular systolic function is low normal. The right ventricular size is normal. There is normal pulmonary artery systolic pressure.  3. The mitral valve is degenerative. Trivial mitral valve regurgitation. Mild mitral stenosis. The mean mitral valve gradient is 2.0 mmHg.  4. The aortic valve is calcified. Aortic valve regurgitation is not visualized. Aortic valve sclerosis/calcification is present, without any evidence of aortic stenosis. Aortic valve mean gradient measures 4.5 mmHg. Aortic valve Vmax measures 1.43 m/s.  5. The inferior vena cava is normal in size with greater than 50% respiratory variability, suggesting right atrial pressure of 3 mmHg. FINDINGS  Left Ventricle: Left ventricular ejection fraction, by estimation, is 65 to 70%. The left ventricle has normal function. The left ventricle has no regional wall motion abnormalities. The left ventricular internal cavity size was normal in size. There is  no left ventricular hypertrophy. Left ventricular diastolic parameters are indeterminate. Right Ventricle: The right ventricular size is normal. No increase in right ventricular wall thickness. Right ventricular systolic function is low normal. There is normal pulmonary artery systolic pressure. The tricuspid regurgitant velocity is 2.59 m/s,  and with an assumed right atrial pressure of 3 mmHg, the estimated right ventricular systolic pressure is 21.3 mmHg. Left Atrium: Left atrial size was normal in size. Right  Atrium: Right atrial size was normal in size. Pericardium: There is no evidence of pericardial effusion. Presence of epicardial fat layer. Mitral Valve: The mitral valve is degenerative in appearance. There is moderate thickening of the mitral valve leaflet(s). There is moderate  calcification of the mitral valve leaflet(s). Moderately decreased mobility of the mitral valve leaflets. Mild to  moderate mitral annular calcification. Trivial mitral valve regurgitation. Mild mitral valve stenosis. MV peak gradient, 6.3 mmHg. The mean mitral valve gradient is 2.0 mmHg. Tricuspid Valve: The tricuspid valve is grossly normal. Tricuspid valve regurgitation is mild . No evidence of tricuspid stenosis. Aortic Valve: The aortic valve is calcified. Aortic valve regurgitation is not visualized. Aortic valve sclerosis/calcification is present, without any evidence of aortic stenosis. Aortic valve mean gradient measures 4.5 mmHg. Aortic valve peak gradient measures 8.2 mmHg. Pulmonic Valve: The pulmonic valve was not well visualized. Pulmonic valve regurgitation is not visualized. No evidence of pulmonic stenosis. Aorta: The aortic root and ascending aorta are structurally normal, with no evidence of dilitation. Venous: The inferior vena cava is normal in size with greater than 50% respiratory variability, suggesting right atrial pressure of 3 mmHg. IAS/Shunts: No atrial level shunt detected by color flow Doppler.  LEFT VENTRICLE PLAX 2D LVIDd:         4.20 cm   Diastology LVIDs:         2.00 cm   LV e' medial:    3.87 cm/s LV PW:         0.90 cm   LV E/e' medial:  34.2 LV IVS:        1.00 cm   LV e' lateral:   6.55 cm/s LVOT diam:     1.60 cm   LV E/e' lateral: 20.2 LVOT Area:     2.01 cm                           3D Volume EF:                          3D EF:        54 %                          LV EDV:       53 ml                          LV ESV:       24 ml                          LV SV:        29 ml RIGHT VENTRICLE RV S  prime:     8.11 cm/s TAPSE (M-mode): 1.2 cm LEFT ATRIUM             Index        RIGHT ATRIUM           Index LA diam:        4.20 cm 2.46 cm/m   RA Area:     11.00 cm LA Vol (A2C):   38.1 ml 22.36 ml/m  RA Volume:   19.70 ml  11.56 ml/m LA Vol (A4C):   45.2 ml 26.53 ml/m LA Biplane Vol: 43.5 ml 25.53 ml/m  AORTIC VALVE AV Vmax:      143.50 cm/s AV Vmean:     100.170 cm/s AV VTI:       0.302 m AV Peak Grad: 8.2 mmHg AV Mean Grad: 4.5 mmHg  AORTA Ao Root diam: 3.30 cm Ao Asc diam:  3.20 cm  MITRAL VALVE                TRICUSPID VALVE MV Area (PHT): 4.12 cm     TR Peak grad:   26.8 mmHg MV Peak grad:  6.3 mmHg     TR Vmax:        259.00 cm/s MV Mean grad:  2.0 mmHg MV Vmax:       1.25 m/s     SHUNTS MV Vmean:      61.5 cm/s    Systemic Diam: 1.60 cm MV Decel Time: 184 msec MV E velocity: 132.33 cm/s Dorris Carnes MD Electronically signed by Dorris Carnes MD Signature Date/Time: 05/05/2022/9:19:35 AM    Final    MR BRAIN WO CONTRAST  Result Date: 05/05/2022 CLINICAL DATA:  Initial evaluation for dizziness. EXAM: MRI HEAD WITHOUT CONTRAST TECHNIQUE: Multiplanar, multiecho pulse sequences of the brain and surrounding structures were obtained without intravenous contrast. COMPARISON:  Prior CT from earlier the same day. FINDINGS: Brain: Cerebral volume within normal limits. Mild scattered patchy T2/FLAIR hyperintensity noted involving the periventricular and deep white matter both cerebral hemispheres, overall mild in nature, and less than is typically seen for age. No evidence for acute or subacute ischemia. Note made of a tiny apparent punctate focus of diffusion signal abnormality at the right ventral cervicomedullary junction on axial DWI sequence (series 2, image 7), not seen on corresponding sequences, and favored to be artifactual. Gray-white matter differentiation maintained. No areas of chronic cortical infarction. No acute or chronic intracranial blood products. No mass lesion, midline shift or mass effect no  hydrocephalus or extra-axial fluid collection. Pituitary gland and suprasellar region within normal limits. Vascular: Major intracranial vascular flow voids are maintained. Skull and upper cervical spine: Craniocervical junction normal. Bone marrow signal intensity within normal limits. No scalp soft tissue abnormality. Sinuses/Orbits: Prior bowel ocular lens replacement. Paranasal sinuses are largely clear. Trace left mastoid effusion noted, of doubtful significance. Other: None. IMPRESSION: Normal brain MRI for age.  No acute intracranial abnormality. Electronically Signed   By: Jeannine Boga M.D.   On: 05/05/2022 01:38        Scheduled Meds:  apixaban  5 mg Oral BID   atorvastatin  40 mg Oral Daily   meclizine  25 mg Oral Q8H   metoprolol succinate  25 mg Oral Once   [START ON 05/07/2022] metoprolol succinate  50 mg Oral Daily   pantoprazole  40 mg Oral Daily   Continuous Infusions:     LOS: 1 day    Time spent: 45 minutes spent on chart review, discussion with nursing staff, consultants, updating family and interview/physical exam; more than 50% of that time was spent in counseling and/or coordination of care.    Geradine Girt, DO [facility] Available via Epic secure chat 7am-7pm After these hours, please refer to coverage provider listed on [email or web address] 05/06/2022, 12:51 PM

## 2022-05-06 NOTE — Progress Notes (Signed)
Physical Therapy Treatment Patient Details Name: [name] [name] MRN: [id number] DOB: 1924-06-02 Today's Date: 05/06/2022   History of Present Illness Pt reports that she was initially mildly dizzy but as the days progressed her symptoms were getting worse. On 1/10 she woke up and was extremely dizzy she could not stand up becuase the world was spinning. She could not focus her eyes. Her daughter came over and pt vomited. When she moves her head she becomes dizzy. Pt was taken to ER and was given Zofran which helped. Orthostatics were positive and Dix-Hallpike was positive. Pt has PMH COPD, HF, A fib, Hyperlipidemia, HTN.    PT Comments    Patient presents with symptoms of both L posterior canal BPPV and likely unilateral vestibular hypofunction versus bilateral.  She fatigued so did not check for horizontal canal today but did repeat Eply canalith repositioning for L posterior canal.  Patient was unable to ambulate far in hallway due to symptoms even prior to testing and family reports unable to assist her for very long in her home.  Feel she will need STSNF level rehab to allow increased time to accommodate to vestibular hypofunction as well as to effectively resolve the positional vertigo.  PT will continue to follow.  Family and pt in agreement.    Recommendations for follow up therapy are one component of a multi-disciplinary discharge planning process, led by the attending physician.  Recommendations may be updated based on patient status, additional functional criteria and insurance authorization.  Follow Up Recommendations  Skilled nursing-short term rehab (<3 hours/day) (for vestibular rehab) Can patient physically be transported by private vehicle: Yes   Assistance Recommended at Discharge Frequent or constant Supervision/Assistance  Patient can return home with the following A little help with walking and/or transfers;Assist for transportation;Help with stairs or ramp for entrance    Equipment Recommendations  None recommended by PT    Recommendations for Other Services       Precautions / Restrictions Precautions Precautions: Fall     Mobility  Bed Mobility Overal bed mobility: Needs Assistance Bed Mobility: Supine to Sit, Sit to Supine     Supine to sit: Min assist Sit to supine: Mod assist   General bed mobility comments: assist for legs into bed, assist to lift trunk after repositioning maneuver    Transfers Overall transfer level: Needs assistance Equipment used: Rolling walker (2 wheels) Transfers: Sit to/from Stand Sit to Stand: Min guard           General transfer comment: assist for balance and safety, pt sitting with uncontrolled descent after ambulating back from hallway    Ambulation/Gait Ambulation/Gait assistance: Min assist Gait Distance (Feet): 70 Feet (x 2) Assistive device: Rolling walker (2 wheels) Gait Pattern/deviations: Step-through pattern, Decreased stride length, Wide base of support, Shuffle       General Gait Details: very slow pace when symptoms increasing, noted HR up to 140 transiently but back to 110's.  cues throughout for eyes focused on stationary target to reduce symptoms, pt still c/o nausea and fatigue so seated rest in hallway   Stairs             Wheelchair Mobility    Modified Rankin (Stroke Patients Only)       Balance Overall balance assessment: Needs assistance   Sitting balance-Leahy Scale: Fair     Standing balance support: Bilateral upper extremity supported Standing balance-Leahy Scale: Poor Standing balance comment: UE support and minguard for balance  Cognition Arousal/Alertness: Awake/alert Behavior During Therapy: WFL for tasks assessed/performed Overall Cognitive Status: Within Functional Limits for tasks assessed                                          Exercises      General Comments General comments  (skin integrity, edema, etc.): Education regarding vestibular hypofunction and can take time to return to baseline with therapy; discussed possible plan for SNF.  completed Eply canalith repositioning for L posterior canal BPPV.      Pertinent Vitals/Pain Pain Assessment Pain Assessment: No/denies pain    Home Living                          Prior Function            PT Goals (current goals can now be found in the care plan section) Progress towards PT goals: Progressing toward goals    Frequency    Min 3X/week      PT Plan Discharge plan needs to be updated    Co-evaluation              AM-PAC PT "6 Clicks" Mobility   Outcome Measure  Help needed turning from your back to your side while in a flat bed without using bedrails?: A Little Help needed moving from lying on your back to sitting on the side of a flat bed without using bedrails?: A Little Help needed moving to and from a bed to a chair (including a wheelchair)?: A Little Help needed standing up from a chair using your arms (e.g., wheelchair or bedside chair)?: A Little Help needed to walk in hospital room?: A Little Help needed climbing 3-5 steps with a railing? : Total 6 Click Score: 16    End of Session Equipment Utilized During Treatment: Gait belt Activity Tolerance: Patient limited by fatigue Patient left: in chair;with family/visitor present;with call bell/phone within reach   PT Visit Diagnosis: Unsteadiness on feet (R26.81);Dizziness and giddiness (R42);BPPV;Other abnormalities of gait and mobility (R26.89) BPPV - Right/Left : Left     Time: 1157-2620 PT Time Calculation (min) (ACUTE ONLY): 55 min  Charges:  $Gait Training: 8-22 mins $Neuromuscular Re-education: 8-22 mins $Self Care/Home Management: 8-22 $Canalith Rep Proc: 8-22 mins                     Magda Kiel, PT Acute Rehabilitation Services [phone] 05/06/2022    Reginia Naas 05/06/2022, 12:57 PM

## 2022-05-07 DIAGNOSIS — R42 Dizziness and giddiness: Secondary | ICD-10-CM | POA: Diagnosis not present

## 2022-05-07 MED ORDER — METOPROLOL SUCCINATE ER 25 MG PO TB24
25.0000 mg | ORAL_TABLET | Freq: Once | ORAL | Status: AC
  Administered 2022-05-07: 25 mg via ORAL
  Filled 2022-05-07: qty 1

## 2022-05-07 MED ORDER — METOPROLOL SUCCINATE ER 50 MG PO TB24
75.0000 mg | ORAL_TABLET | Freq: Every day | ORAL | Status: DC
  Administered 2022-05-08 – 2022-05-10 (×3): 75 mg via ORAL
  Filled 2022-05-07 (×3): qty 1

## 2022-05-07 NOTE — Progress Notes (Signed)
Mobility Specialist Progress Note    05/07/22 1621  Mobility  Activity Ambulated with assistance in hallway  Level of Assistance Contact guard assist, steadying assist  Assistive Device Front wheel walker  Distance Ambulated (ft) 180 ft (90+90)  Activity Response Tolerated well  Mobility Referral Yes  $Mobility charge 1 Mobility   Pre-Mobility: 92 HR During Mobility: 100 HR Post-Mobility: 96 HR, 144/83 (101) BP  Pt received in chair and agreeable. No complaints on walk. Took x1 seated rest break. Returned to sitting EOB with call bell in reach.    Hildred Alamin Mobility Specialist  Please Psychologist, sport and exercise or Rehab Office at [phone]

## 2022-05-07 NOTE — Plan of Care (Signed)

## 2022-05-07 NOTE — Progress Notes (Signed)
PROGRESS NOTE    [name] [name]  RWE:[id number] DOB: May 18, 1924 DOA: 05/04/2022 PCP: Marin Olp, MD    Brief Narrative:  Brought in with dizziness and N/V. Needs SNF and continued vestibular work up   Assessment and Plan: Dizziness-- vertigo -MRI negative - orthostatic vitals negative -Meclizine scheduled Q8-- vestibular PT ordered -Zofran as needed for nausea and vomiting   Hyperlipidemia -Stable, atorvastatin resumed   Essential hypertension -Metoprolol --increased dose as able   COPD mixed type (HCC) -Nebulizers as needed   CKD (chronic kidney disease), stage IIIa (HCC) -d/c IVF    Permanent atrial fibrillation (HCC) -Continue Toprol at a lower dose and Eliquis  Dyphagia -SLp eval  GErd -protonix  Obesity Estimated body mass index is 33.53 kg/m as calculated from the following:   Height as of this encounter: '4\' 11"'$  (1.499 m).   Weight as of this encounter: 75.3 kg.   DVT prophylaxis: SCD's Start: 05/05/22 0622 apixaban (ELIQUIS) tablet 5 mg    Code Status: Full Code Family Communication: son at bedside  Disposition Plan:  Level of care: Telemetry Medical Status is: Inpt-- needs SNF placement  Consultants:  PT vestibular   Subjective: No SOB, noCP  Objective: Vitals:   05/06/22 0851 05/07/22 0130 05/07/22 0630 05/07/22 1017  BP: (!) 141/78 119/64 120/79 (!) 143/74  Pulse: (!) 102     Resp:  20  17  Temp:  98.5 F (36.9 C) 98.8 F (37.1 C)   TempSrc:  Oral Oral   SpO2:  90% 95%   Weight:      Height:       No intake or output data in the 24 hours ending 05/07/22 1158  Filed Weights   05/04/22 1135  Weight: 75.3 kg    Examination:   General: Appearance:    Obese female in no acute distress     Lungs:     respirations unlabored  Heart:    Tachycardic. irregular   MS:   All extremities are intact.    Neurologic:   Awake, alert, oriented x 3. No apparent focal neurological           defect.        Data Reviewed: I  have personally reviewed following labs and imaging studies  CBC: Recent Labs  Lab 05/04/22 1140 05/05/22 0652 05/06/22 0418  WBC 8.9 7.2 7.6  NEUTROABS 6.5  --   --   HGB 12.6 12.4 12.6  HCT 37.6 35.9* 37.6  MCV 86.8 86.7 87.4  PLT 128* 120* 619*   Basic Metabolic Panel: Recent Labs  Lab 05/04/22 1140 05/05/22 0652 05/06/22 0418  NA 142  --  140  K 3.7  --  3.8  CL 102  --  103  CO2 29  --  28  GLUCOSE 140*  --  104*  BUN 21  --  14  CREATININE 0.96 0.95 0.92  CALCIUM 8.7*  --  8.8*   GFR: Estimated Creatinine Clearance: 32.3 mL/min (by C-G formula based on SCr of 0.92 mg/dL). Liver Function Tests: Recent Labs  Lab 05/04/22 1140  AST 16  ALT 8  ALKPHOS 62  BILITOT 0.8  PROT 6.0*  ALBUMIN 3.9   Recent Labs  Lab 05/04/22 1140  LIPASE 25   No results for input(s): "AMMONIA" in the last 168 hours. Coagulation Profile: No results for input(s): "INR", "PROTIME" in the last 168 hours. Cardiac Enzymes: No results for input(s): "CKTOTAL", "CKMB", "CKMBINDEX", "TROPONINI" in the last 168 hours.  BNP (last 3 results) No results for input(s): "PROBNP" in the last 8760 hours. HbA1C: Recent Labs    05/05/22 0652  HGBA1C 6.0*   CBG: No results for input(s): "GLUCAP" in the last 168 hours. Lipid Profile: Recent Labs    05/06/22 0418  CHOL 106  HDL 46  LDLCALC 37  TRIG 116  CHOLHDL 2.3   Thyroid Function Tests: No results for input(s): "TSH", "T4TOTAL", "FREET4", "T3FREE", "THYROIDAB" in the last 72 hours. Anemia Panel: No results for input(s): "VITAMINB12", "FOLATE", "FERRITIN", "TIBC", "IRON", "RETICCTPCT" in the last 72 hours. Sepsis Labs: No results for input(s): "PROCALCITON", "LATICACIDVEN" in the last 168 hours.  Recent Results (from the past 240 hour(s))  Resp panel by RT-PCR (RSV, Flu A&B, Covid) Anterior Nasal Swab     Status: None   Collection Time: 05/04/22 11:50 AM   Specimen: Anterior Nasal Swab  Result Value Ref Range Status   SARS  Coronavirus 2 by RT PCR NEGATIVE NEGATIVE Final    Comment: (NOTE) SARS-CoV-2 target nucleic acids are NOT DETECTED.  The SARS-CoV-2 RNA is generally detectable in upper respiratory specimens during the acute phase of infection. The lowest concentration of SARS-CoV-2 viral copies this assay can detect is 138 copies/mL. A negative result does not preclude SARS-Cov-2 infection and should not be used as the sole basis for treatment or other patient management decisions. A negative result may occur with  improper specimen collection/handling, submission of specimen other than nasopharyngeal swab, presence of viral mutation(s) within the areas targeted by this assay, and inadequate number of viral copies(<138 copies/mL). A negative result must be combined with clinical observations, patient history, and epidemiological information. The expected result is Negative.  Fact Sheet for Patients:  EntrepreneurPulse.com.au  Fact Sheet for Healthcare Providers:  IncredibleEmployment.be  This test is no t yet approved or cleared by the [place] FDA and  has been authorized for detection and/or diagnosis of SARS-CoV-2 by FDA under an Emergency Use Authorization (EUA). This EUA will remain  in effect (meaning this test can be used) for the duration of the COVID-19 declaration under Section 564(b)(1) of the Act, 21 U.S.C.section 360bbb-3(b)(1), unless the authorization is terminated  or revoked sooner.       Influenza A by PCR NEGATIVE NEGATIVE Final   Influenza B by PCR NEGATIVE NEGATIVE Final    Comment: (NOTE) The Xpert Xpress SARS-CoV-2/FLU/RSV plus assay is intended as an aid in the diagnosis of influenza from Nasopharyngeal swab specimens and should not be used as a sole basis for treatment. Nasal washings and aspirates are unacceptable for Xpert Xpress SARS-CoV-2/FLU/RSV testing.  Fact Sheet for  Patients: EntrepreneurPulse.com.au  Fact Sheet for Healthcare Providers: IncredibleEmployment.be  This test is not yet approved or cleared by the [place] FDA and has been authorized for detection and/or diagnosis of SARS-CoV-2 by FDA under an Emergency Use Authorization (EUA). This EUA will remain in effect (meaning this test can be used) for the duration of the COVID-19 declaration under Section 564(b)(1) of the Act, 21 U.S.C. section 360bbb-3(b)(1), unless the authorization is terminated or revoked.     Resp Syncytial Virus by PCR NEGATIVE NEGATIVE Final    Comment: (NOTE) Fact Sheet for Patients: EntrepreneurPulse.com.au  Fact Sheet for Healthcare Providers: IncredibleEmployment.be  This test is not yet approved or cleared by the [place] FDA and has been authorized for detection and/or diagnosis of SARS-CoV-2 by FDA under an Emergency Use Authorization (EUA). This EUA will remain in effect (meaning this test can be used)  for the duration of the COVID-19 declaration under Section 564(b)(1) of the Act, 21 U.S.C. section 360bbb-3(b)(1), unless the authorization is terminated or revoked.  Performed at [facility], [address], [place], [place] [address]          Radiology Studies: No results found.      Scheduled Meds:  apixaban  5 mg Oral BID   atorvastatin  40 mg Oral Daily   meclizine  25 mg Oral Q8H   metoprolol succinate  25 mg Oral Once   metoprolol succinate  50 mg Oral Daily   pantoprazole  40 mg Oral Daily   Continuous Infusions:     LOS: 2 days    Time spent: 45 minutes spent on chart review, discussion with nursing staff, consultants, updating family and interview/physical exam; more than 50% of that time was spent in counseling and/or coordination of care.    Geradine Girt, DO [facility] Available via Epic secure chat  7am-7pm After these hours, please refer to coverage provider listed on [email or web address] 05/07/2022, 11:58 AM

## 2022-05-08 ENCOUNTER — Inpatient Hospital Stay (HOSPITAL_COMMUNITY): Payer: Medicare Other

## 2022-05-08 DIAGNOSIS — R42 Dizziness and giddiness: Secondary | ICD-10-CM | POA: Diagnosis not present

## 2022-05-08 LAB — BASIC METABOLIC PANEL
Anion gap: 9 (ref 5–15)
BUN: 13 mg/dL (ref 8–23)
CO2: 27 mmol/L (ref 22–32)
Calcium: 8.8 mg/dL — ABNORMAL LOW (ref 8.9–10.3)
Chloride: 102 mmol/L (ref 98–111)
Creatinine, Ser: 0.95 mg/dL (ref 0.44–1.00)
GFR, Estimated: 55 mL/min — ABNORMAL LOW (ref >60)
Glucose, Bld: 123 mg/dL — ABNORMAL HIGH (ref 70–99)
Potassium: 4 mmol/L (ref 3.5–5.1)
Sodium: 138 mmol/L (ref 135–145)

## 2022-05-08 LAB — CBC
HCT: 38.6 % (ref 36.0–46.0)
Hemoglobin: 12.5 g/dL (ref 12.0–15.0)
MCH: 28.6 pg (ref 26.0–34.0)
MCHC: 32.4 g/dL (ref 30.0–36.0)
MCV: 88.3 fL (ref 80.0–100.0)
Platelets: 129 10*3/uL — ABNORMAL LOW (ref 150–400)
RBC: 4.37 MIL/uL (ref 3.87–5.11)
RDW: 13.4 % (ref 11.5–15.5)
WBC: 10.3 10*3/uL (ref 4.0–10.5)
nRBC: 0 % (ref 0.0–0.2)

## 2022-05-08 MED ORDER — DICLOFENAC SODIUM 1 % EX GEL
2.0000 g | Freq: Four times a day (QID) | CUTANEOUS | Status: DC
  Administered 2022-05-08 – 2022-05-09 (×8): 2 g via TOPICAL
  Filled 2022-05-08: qty 100

## 2022-05-08 MED ORDER — ACETAMINOPHEN 500 MG PO TABS
1000.0000 mg | ORAL_TABLET | Freq: Two times a day (BID) | ORAL | Status: DC
  Administered 2022-05-08 – 2022-05-10 (×4): 1000 mg via ORAL
  Filled 2022-05-08 (×4): qty 2

## 2022-05-08 NOTE — Final Progress Note (Signed)
Got patient up a couple of times to use the bathroom.  She complained of right ankle pain saying that it started 3 days ago and is getting progressively worst.  Gave her some Tylenol 650 mg PO this morning per her request.  No visible injuries.  No pain at rest.  MD, please discuss with patient when rounding.  Patient is concerned that it may be more than a sprained ankle.  Thank you.

## 2022-05-08 NOTE — Progress Notes (Signed)
PROGRESS NOTE    [name] [name]  WEX:[id number] DOB: [birth year] DOA: 05/04/2022 PCP: Marin Olp, MD    Brief Narrative:  Brought in with dizziness and N/V. Needs SNF and continued vestibular work up   Assessment and Plan: Dizziness-- vertigo -MRI negative - orthostatic vitals negative -Meclizine scheduled Q8-- vestibular PT ordered -Zofran as needed for nausea and vomiting   Hyperlipidemia -Stable, atorvastatin resumed   Essential hypertension -Metoprolol --increased dose as able   COPD mixed type (HCC) -Nebulizers as needed   CKD (chronic kidney disease), stage IIIa (HCC) -d/c IVF    Permanent atrial fibrillation (HCC) -Continue Toprol at a lower dose and Eliquis  Dyphagia -SLp eval  GErd -protonix  Right ankle pain -dg ankle -voltaren gel   Obesity Estimated body mass index is 33.53 kg/m as calculated from the following:   Height as of this encounter: '4\' 11"'$  (1.499 m).   Weight as of this encounter: 75.3 kg.   DVT prophylaxis: SCD's Start: 05/05/22 0622 apixaban (ELIQUIS) tablet 5 mg    Code Status: Full Code Family Communication: son at bedside  Disposition Plan:  Level of care: Telemetry Medical Status is: Inpt-- needs SNF placement  Consultants:  PT vestibular   Subjective: C/o right ankle pain-- does not feel like her gout  Objective: Vitals:   05/07/22 1331 05/07/22 2115 05/08/22 0442 05/08/22 0925  BP: 131/76   139/87  Pulse: 100   (!) 50  Resp:    16  Temp: 98.2 F (36.8 C) 99.1 F (37.3 C) 98.3 F (36.8 C) 98.1 F (36.7 C)  TempSrc: Oral Oral Oral Oral  SpO2:   99% 97%  Weight:      Height:        Intake/Output Summary (Last 24 hours) at 05/08/2022 1137 Last data filed at 05/08/2022 0440 Gross per 24 hour  Intake --  Output 400 ml  Net -400 ml    Filed Weights   05/04/22 1135  Weight: 75.3 kg    Examination:   General: Appearance:    Obese female in no acute distress     Lungs:     respirations  unlabored  Heart:    Bradycardic. irregular   MS:   All extremities are intact.   Neurologic:   Awake, alert       Data Reviewed: I have personally reviewed following labs and imaging studies  CBC: Recent Labs  Lab 05/04/22 1140 05/05/22 0652 05/06/22 0418 05/08/22 0259  WBC 8.9 7.2 7.6 10.3  NEUTROABS 6.5  --   --   --   HGB 12.6 12.4 12.6 12.5  HCT 37.6 35.9* 37.6 38.6  MCV 86.8 86.7 87.4 88.3  PLT 128* 120* 121* 938*   Basic Metabolic Panel: Recent Labs  Lab 05/04/22 1140 05/05/22 0652 05/06/22 0418 05/08/22 0259  NA 142  --  140 138  K 3.7  --  3.8 4.0  CL 102  --  103 102  CO2 29  --  28 27  GLUCOSE 140*  --  104* 123*  BUN 21  --  14 13  CREATININE 0.96 0.95 0.92 0.95  CALCIUM 8.7*  --  8.8* 8.8*   GFR: Estimated Creatinine Clearance: 31.3 mL/min (by C-G formula based on SCr of 0.95 mg/dL). Liver Function Tests: Recent Labs  Lab 05/04/22 1140  AST 16  ALT 8  ALKPHOS 62  BILITOT 0.8  PROT 6.0*  ALBUMIN 3.9   Recent Labs  Lab 05/04/22 1140  LIPASE 25  No results for input(s): "AMMONIA" in the last 168 hours. Coagulation Profile: No results for input(s): "INR", "PROTIME" in the last 168 hours. Cardiac Enzymes: No results for input(s): "CKTOTAL", "CKMB", "CKMBINDEX", "TROPONINI" in the last 168 hours. BNP (last 3 results) No results for input(s): "PROBNP" in the last 8760 hours. HbA1C: No results for input(s): "HGBA1C" in the last 72 hours.  CBG: No results for input(s): "GLUCAP" in the last 168 hours. Lipid Profile: Recent Labs    05/06/22 0418  CHOL 106  HDL 46  LDLCALC 37  TRIG 116  CHOLHDL 2.3   Thyroid Function Tests: No results for input(s): "TSH", "T4TOTAL", "FREET4", "T3FREE", "THYROIDAB" in the last 72 hours. Anemia Panel: No results for input(s): "VITAMINB12", "FOLATE", "FERRITIN", "TIBC", "IRON", "RETICCTPCT" in the last 72 hours. Sepsis Labs: No results for input(s): "PROCALCITON", "LATICACIDVEN" in the last 168  hours.  Recent Results (from the past 240 hour(s))  Resp panel by RT-PCR (RSV, Flu A&B, Covid) Anterior Nasal Swab     Status: None   Collection Time: 05/04/22 11:50 AM   Specimen: Anterior Nasal Swab  Result Value Ref Range Status   SARS Coronavirus 2 by RT PCR NEGATIVE NEGATIVE Final    Comment: (NOTE) SARS-CoV-2 target nucleic acids are NOT DETECTED.  The SARS-CoV-2 RNA is generally detectable in upper respiratory specimens during the acute phase of infection. The lowest concentration of SARS-CoV-2 viral copies this assay can detect is 138 copies/mL. A negative result does not preclude SARS-Cov-2 infection and should not be used as the sole basis for treatment or other patient management decisions. A negative result may occur with  improper specimen collection/handling, submission of specimen other than nasopharyngeal swab, presence of viral mutation(s) within the areas targeted by this assay, and inadequate number of viral copies(<138 copies/mL). A negative result must be combined with clinical observations, patient history, and epidemiological information. The expected result is Negative.  Fact Sheet for Patients:  EntrepreneurPulse.com.au  Fact Sheet for Healthcare Providers:  IncredibleEmployment.be  This test is no t yet approved or cleared by the [place] FDA and  has been authorized for detection and/or diagnosis of SARS-CoV-2 by FDA under an Emergency Use Authorization (EUA). This EUA will remain  in effect (meaning this test can be used) for the duration of the COVID-19 declaration under Section 564(b)(1) of the Act, 21 U.S.C.section 360bbb-3(b)(1), unless the authorization is terminated  or revoked sooner.       Influenza A by PCR NEGATIVE NEGATIVE Final   Influenza B by PCR NEGATIVE NEGATIVE Final    Comment: (NOTE) The Xpert Xpress SARS-CoV-2/FLU/RSV plus assay is intended as an aid in the diagnosis of influenza from  Nasopharyngeal swab specimens and should not be used as a sole basis for treatment. Nasal washings and aspirates are unacceptable for Xpert Xpress SARS-CoV-2/FLU/RSV testing.  Fact Sheet for Patients: EntrepreneurPulse.com.au  Fact Sheet for Healthcare Providers: IncredibleEmployment.be  This test is not yet approved or cleared by the [place] FDA and has been authorized for detection and/or diagnosis of SARS-CoV-2 by FDA under an Emergency Use Authorization (EUA). This EUA will remain in effect (meaning this test can be used) for the duration of the COVID-19 declaration under Section 564(b)(1) of the Act, 21 U.S.C. section 360bbb-3(b)(1), unless the authorization is terminated or revoked.     Resp Syncytial Virus by PCR NEGATIVE NEGATIVE Final    Comment: (NOTE) Fact Sheet for Patients: EntrepreneurPulse.com.au  Fact Sheet for Healthcare Providers: IncredibleEmployment.be  This test is not yet approved  or cleared by the [place] and has been authorized for detection and/or diagnosis of SARS-CoV-2 by FDA under an Emergency Use Authorization (EUA). This EUA will remain in effect (meaning this test can be used) for the duration of the COVID-19 declaration under Section 564(b)(1) of the Act, 21 U.S.C. section 360bbb-3(b)(1), unless the authorization is terminated or revoked.  Performed at [facility], [address] [place], [place] [address]          Radiology Studies: No results found.      Scheduled Meds:  acetaminophen  1,000 mg Oral BID   apixaban  5 mg Oral BID   atorvastatin  40 mg Oral Daily   diclofenac Sodium  2 g Topical QID   meclizine  25 mg Oral Q8H   metoprolol succinate  75 mg Oral Daily   pantoprazole  40 mg Oral Daily   Continuous Infusions:     LOS: 3 days    Time spent: 45 minutes spent on chart review, discussion with nursing  staff, consultants, updating family and interview/physical exam; more than 50% of that time was spent in counseling and/or coordination of care.    Geradine Girt, DO [facility] Available via Epic secure chat 7am-7pm After these hours, please refer to coverage provider listed on [email or web address] 05/08/2022, 11:37 AM

## 2022-05-08 NOTE — Progress Notes (Signed)
Mobility Specialist Progress Note    05/08/22 1054  Mobility  Activity Ambulated with assistance in room  Level of Assistance Moderate assist, patient does 50-74%  Assistive Device Front wheel walker  Distance Ambulated (ft) 6 ft  Activity Response Tolerated fair  Mobility Referral Yes  $Mobility charge 1 Mobility   Pre-Mobility: 94 HR During Mobility: 103 HR Post-Mobility: 99 HR, 145/97 (103) BP  Pt received in bed and agreeable. C/o 5-6/10 ankle pain and stated her dizziness was not too bad. Upon standing, ankle pain increased and pt c/o nausea. Returned to supine. Monitor alerted to vtach into the 130s once supine then HR quickly recovered to 90s. Left in bed with call bell in reach and son present. RN notified.   Hildred Alamin Mobility Specialist  Please Psychologist, sport and exercise or Rehab Office at [phone]

## 2022-05-09 DIAGNOSIS — R42 Dizziness and giddiness: Secondary | ICD-10-CM | POA: Diagnosis not present

## 2022-05-09 MED ORDER — MECLIZINE HCL 25 MG PO TABS: 25.0000 mg | ORAL_TABLET | Freq: Three times a day (TID) | ORAL | Status: DC | PRN

## 2022-05-09 NOTE — Care Management Important Message (Signed)
Important Message  Patient Details  Name: [name] [name] MRN: [id number] Date of Birth: Feb 11, 1925   Medicare Important Message Given:  Other (see comment)     Hannah Beat 05/09/2022, 10:11 AM

## 2022-05-09 NOTE — Plan of Care (Signed)
  Problem: Clinical Measurements: Goal: Will remain free from infection Outcome: Progressing   Problem: Activity: Goal: Risk for activity intolerance will decrease Outcome: Progressing   Problem: Nutrition: Goal: Adequate nutrition will be maintained Outcome: Progressing   Problem: Skin Integrity: Goal: Risk for impaired skin integrity will decrease Outcome: Progressing

## 2022-05-09 NOTE — Progress Notes (Signed)
Orthopedic Tech Progress Note Patient Details:  [name] [name] 1925-03-29 [id number]  Ortho Devices Type of Ortho Device: Ankle splint Ortho Device/Splint Location: Right ankle Ortho Device/Splint Interventions: Application   Post Interventions Patient Tolerated: Well Instructions Provided: Adjustment of device  Helene Bernstein E Sarafina Puthoff 05/09/2022, 8:22 AM

## 2022-05-09 NOTE — Progress Notes (Signed)
Physical Therapy Treatment Patient Details Name: [name] [name] MRN: [id number] DOB: [birth year] Today's Date: 05/09/2022   History of Present Illness Pt reports that she was initially mildly dizzy but as the days progressed her symptoms were getting worse. On 1/10 she woke up and was extremely dizzy she could not stand up becuase the world was spinning. She could not focus her eyes. Her daughter came over and pt vomited. When she moves her head she becomes dizzy. Pt was taken to ER and was given Zofran which helped. Orthostatics were positive and Dix-Hallpike was positive. Pt has PMH COPD, HF, A fib, Hyperlipidemia, HTN.    PT Comments    Retested L posterior canal with no nystagmus noted, and no reported dizziness from pt. She states several times throughout session that her head felt "funny" but only describes as heavy, not any spinning, vertigo, or dizziness. Pt ambulated in hallway with 1 seated rest break. Continues to appear to have decreased tolerance for functional activity and SNF recommendation remains the most appropriate if pt will not have consistent assistance at home. Will continue to follow.     Recommendations for follow up therapy are one component of a multi-disciplinary discharge planning process, led by the attending physician.  Recommendations may be updated based on patient status, additional functional criteria and insurance authorization.  Follow Up Recommendations  Skilled nursing-short term rehab (<3 hours/day) (for vestibular rehab) Can patient physically be transported by private vehicle: Yes   Assistance Recommended at Discharge Frequent or constant Supervision/Assistance  Patient can return home with the following A little help with walking and/or transfers;Assist for transportation;Help with stairs or ramp for entrance   Equipment Recommendations  None recommended by PT    Recommendations for Other Services       Precautions / Restrictions  Precautions Precautions: Fall Restrictions Weight Bearing Restrictions: No     Mobility  Bed Mobility Overal bed mobility: Needs Assistance Bed Mobility: Supine to Sit     Supine to sit: Min guard     General bed mobility comments: HOB slightly elevated. Pt was able to transition to EOB without physical assist. Increased time to scoot forward and get feet on the floor.    Transfers Overall transfer level: Needs assistance Equipment used: Rolling walker (2 wheels) Transfers: Sit to/from Stand Sit to Stand: Min guard, Min assist           General transfer comment: Assist for power up to full stand from lower surface. Min guard from EOB.    Ambulation/Gait Ambulation/Gait assistance: Min guard Gait Distance (Feet): 50 Feet (x2) Assistive device: Rolling walker (2 wheels) Gait Pattern/deviations: Step-through pattern, Decreased stride length, Wide base of support, Shuffle Gait velocity: Decreased Gait velocity interpretation: 1.31 - 2.62 ft/sec, indicative of limited community ambulator   General Gait Details: Slow but generally steady with RW for support. Pt reporting her head feels "heavy" but denies vertigo. Pt took 1 seated rest break after ~50' before ambulating back to the room. Endorses that she fatigues quickly but was overall motivated for distance.   Stairs             Wheelchair Mobility    Modified Rankin (Stroke Patients Only)       Balance Overall balance assessment: Needs assistance Sitting-balance support: Bilateral upper extremity supported, Feet supported Sitting balance-Leahy Scale: Fair Sitting balance - Comments: impaired initially due to vertigo until symptoms subside.   Standing balance support: Bilateral upper extremity supported Standing balance-Leahy Scale: Poor Standing balance  comment: UE support and minguard for balance                            Cognition Arousal/Alertness: Awake/alert Behavior During Therapy:  WFL for tasks assessed/performed Overall Cognitive Status: Within Functional Limits for tasks assessed                                          Exercises      General Comments        Pertinent Vitals/Pain Pain Assessment Pain Assessment: No/denies pain    Home Living                          Prior Function            PT Goals (current goals can now be found in the care plan section) Acute Rehab PT Goals Patient Stated Goal: Decrease dizziness PT Goal Formulation: With patient/family Time For Goal Achievement: 05/19/22 Potential to Achieve Goals: Good Progress towards PT goals: Progressing toward goals    Frequency    Min 3X/week      PT Plan Current plan remains appropriate    Co-evaluation              AM-PAC PT "6 Clicks" Mobility   Outcome Measure  Help needed turning from your back to your side while in a flat bed without using bedrails?: A Little Help needed moving from lying on your back to sitting on the side of a flat bed without using bedrails?: A Little Help needed moving to and from a bed to a chair (including a wheelchair)?: A Little Help needed standing up from a chair using your arms (e.g., wheelchair or bedside chair)?: A Little Help needed to walk in hospital room?: A Little Help needed climbing 3-5 steps with a railing? : Total 6 Click Score: 16    End of Session Equipment Utilized During Treatment: Gait belt Activity Tolerance: Patient tolerated treatment well Patient left: in chair;with family/visitor present;with call bell/phone within reach Nurse Communication: Mobility status PT Visit Diagnosis: Unsteadiness on feet (R26.81);Dizziness and giddiness (R42);BPPV;Other abnormalities of gait and mobility (R26.89) BPPV - Right/Left : Left     Time: 3716-9678 PT Time Calculation (min) (ACUTE ONLY): 28 min  Charges:  $Gait Training: 8-22 mins $Therapeutic Activity: 8-22 mins                     [name] [name], PT, DPT Acute Rehabilitation Services Secure Chat Preferred Office: [phone]    Thelma Comp 05/09/2022, 3:08 PM

## 2022-05-09 NOTE — Progress Notes (Signed)
PROGRESS NOTE    [name] [name]  WTU:[id number] DOB: January 17, 1925 DOA: 05/04/2022 PCP: Marin Olp, MD    Brief Narrative:  Brought in with dizziness and N/V. Needs SNF and continued vestibular work up   Assessment and Plan: Dizziness-- vertigo -MRI negative - orthostatic vitals negative -Meclizine PRN -Zofran as needed for nausea and vomiting   Hyperlipidemia -Stable, atorvastatin resumed   Essential hypertension -Metoprolol --increased dose as able   COPD mixed type (HCC) -Nebulizers as needed   CKD (chronic kidney disease), stage IIIa (HCC) -d/c IVF    Permanent atrial fibrillation (HCC) -Continue Toprol at a lower dose and Eliquis  Dyphagia -SLp eval done  GErd -protonix  Right ankle pain -dg ankle- no fx -voltaren gel-- showed improvement in pain -can use ankle brace when getting up   Obesity Estimated body mass index is 33.53 kg/m as calculated from the following:   Height as of this encounter: '4\' 11"'$  (1.499 m).   Weight as of this encounter: 75.3 kg.   DVT prophylaxis: SCD's Start: 05/05/22 0622 apixaban (ELIQUIS) tablet 5 mg    Code Status: Full Code Family Communication: son at bedside  Disposition Plan:  Level of care: Telemetry Medical Status is: Inpt-- needs SNF placement  Consultants:  PT vestibular   Subjective: Ankle pain better with cream  Objective: Vitals:   05/08/22 2226 05/09/22 0456 05/09/22 0726 05/09/22 1024  BP: 135/80 119/71  133/62  Pulse:  92 88 99  Resp: 20 20    Temp: 99.3 F (37.4 C) 97.9 F (36.6 C) 97.8 F (36.6 C)   TempSrc: Oral Oral Oral   SpO2: 92% 94%    Weight:      Height:       No intake or output data in the 24 hours ending 05/09/22 1112   Filed Weights   05/04/22 1135  Weight: 75.3 kg    Examination:    General: Appearance:    Obese female in no acute distress     Lungs:     respirations unlabored  Heart:    Normal heart rate.   MS:   All extremities are intact.    Neurologic:   Awake, alert         Data Reviewed: I have personally reviewed following labs and imaging studies  CBC: Recent Labs  Lab 05/04/22 1140 05/05/22 0652 05/06/22 0418 05/08/22 0259  WBC 8.9 7.2 7.6 10.3  NEUTROABS 6.5  --   --   --   HGB 12.6 12.4 12.6 12.5  HCT 37.6 35.9* 37.6 38.6  MCV 86.8 86.7 87.4 88.3  PLT 128* 120* 121* 179*   Basic Metabolic Panel: Recent Labs  Lab 05/04/22 1140 05/05/22 0652 05/06/22 0418 05/08/22 0259  NA 142  --  140 138  K 3.7  --  3.8 4.0  CL 102  --  103 102  CO2 29  --  28 27  GLUCOSE 140*  --  104* 123*  BUN 21  --  14 13  CREATININE 0.96 0.95 0.92 0.95  CALCIUM 8.7*  --  8.8* 8.8*   GFR: Estimated Creatinine Clearance: 31.3 mL/min (by C-G formula based on SCr of 0.95 mg/dL). Liver Function Tests: Recent Labs  Lab 05/04/22 1140  AST 16  ALT 8  ALKPHOS 62  BILITOT 0.8  PROT 6.0*  ALBUMIN 3.9   Recent Labs  Lab 05/04/22 1140  LIPASE 25   No results for input(s): "AMMONIA" in the last 168 hours. Coagulation Profile:  No results for input(s): "INR", "PROTIME" in the last 168 hours. Cardiac Enzymes: No results for input(s): "CKTOTAL", "CKMB", "CKMBINDEX", "TROPONINI" in the last 168 hours. BNP (last 3 results) No results for input(s): "PROBNP" in the last 8760 hours. HbA1C: No results for input(s): "HGBA1C" in the last 72 hours.  CBG: No results for input(s): "GLUCAP" in the last 168 hours. Lipid Profile: No results for input(s): "CHOL", "HDL", "LDLCALC", "TRIG", "CHOLHDL", "LDLDIRECT" in the last 72 hours.  Thyroid Function Tests: No results for input(s): "TSH", "T4TOTAL", "FREET4", "T3FREE", "THYROIDAB" in the last 72 hours. Anemia Panel: No results for input(s): "VITAMINB12", "FOLATE", "FERRITIN", "TIBC", "IRON", "RETICCTPCT" in the last 72 hours. Sepsis Labs: No results for input(s): "PROCALCITON", "LATICACIDVEN" in the last 168 hours.  Recent Results (from the past 240 hour(s))  Resp panel by  RT-PCR (RSV, Flu A&B, Covid) Anterior Nasal Swab     Status: None   Collection Time: 05/04/22 11:50 AM   Specimen: Anterior Nasal Swab  Result Value Ref Range Status   SARS Coronavirus 2 by RT PCR NEGATIVE NEGATIVE Final    Comment: (NOTE) SARS-CoV-2 target nucleic acids are NOT DETECTED.  The SARS-CoV-2 RNA is generally detectable in upper respiratory specimens during the acute phase of infection. The lowest concentration of SARS-CoV-2 viral copies this assay can detect is 138 copies/mL. A negative result does not preclude SARS-Cov-2 infection and should not be used as the sole basis for treatment or other patient management decisions. A negative result may occur with  improper specimen collection/handling, submission of specimen other than nasopharyngeal swab, presence of viral mutation(s) within the areas targeted by this assay, and inadequate number of viral copies(<138 copies/mL). A negative result must be combined with clinical observations, patient history, and epidemiological information. The expected result is Negative.  Fact Sheet for Patients:  EntrepreneurPulse.com.au  Fact Sheet for Healthcare Providers:  IncredibleEmployment.be  This test is no t yet approved or cleared by the [place] FDA and  has been authorized for detection and/or diagnosis of SARS-CoV-2 by FDA under an Emergency Use Authorization (EUA). This EUA will remain  in effect (meaning this test can be used) for the duration of the COVID-19 declaration under Section 564(b)(1) of the Act, 21 U.S.C.section 360bbb-3(b)(1), unless the authorization is terminated  or revoked sooner.       Influenza A by PCR NEGATIVE NEGATIVE Final   Influenza B by PCR NEGATIVE NEGATIVE Final    Comment: (NOTE) The Xpert Xpress SARS-CoV-2/FLU/RSV plus assay is intended as an aid in the diagnosis of influenza from Nasopharyngeal swab specimens and should not be used as a sole basis  for treatment. Nasal washings and aspirates are unacceptable for Xpert Xpress SARS-CoV-2/FLU/RSV testing.  Fact Sheet for Patients: EntrepreneurPulse.com.au  Fact Sheet for Healthcare Providers: IncredibleEmployment.be  This test is not yet approved or cleared by the [place] FDA and has been authorized for detection and/or diagnosis of SARS-CoV-2 by FDA under an Emergency Use Authorization (EUA). This EUA will remain in effect (meaning this test can be used) for the duration of the COVID-19 declaration under Section 564(b)(1) of the Act, 21 U.S.C. section 360bbb-3(b)(1), unless the authorization is terminated or revoked.     Resp Syncytial Virus by PCR NEGATIVE NEGATIVE Final    Comment: (NOTE) Fact Sheet for Patients: EntrepreneurPulse.com.au  Fact Sheet for Healthcare Providers: IncredibleEmployment.be  This test is not yet approved or cleared by the [place] FDA and has been authorized for detection and/or diagnosis of SARS-CoV-2 by FDA under  an Emergency Use Authorization (EUA). This EUA will remain in effect (meaning this test can be used) for the duration of the COVID-19 declaration under Section 564(b)(1) of the Act, 21 U.S.C. section 360bbb-3(b)(1), unless the authorization is terminated or revoked.  Performed at [facility], [address] [place], [place] [address]          Radiology Studies: DG Ankle Complete Right  Result Date: 05/08/2022 CLINICAL DATA:  Swelling.  Right ankle pain.  No known injury. EXAM: RIGHT ANKLE - COMPLETE 3+ VIEW COMPARISON:  Radiograph 09/26/2017 FINDINGS: There is no evidence of fracture, dislocation, or joint effusion. Ankle mortise is preserved. Mild chronic spurring of the medial malleolus. There is a plantar calcaneal spur and Achilles tendon enthesophyte. No bony destructive change or erosions. There are vascular  calcifications. Mild soft tissue thickening. No soft tissue gas or radiopaque foreign body. IMPRESSION: 1. Mild soft tissue thickening. No acute osseous abnormality. 2. Vascular calcifications. Electronically Signed   By: Keith Rake M.D.   On: 05/08/2022 13:34        Scheduled Meds:  acetaminophen  1,000 mg Oral BID   apixaban  5 mg Oral BID   atorvastatin  40 mg Oral Daily   diclofenac Sodium  2 g Topical QID   meclizine  25 mg Oral Q8H   metoprolol succinate  75 mg Oral Daily   pantoprazole  40 mg Oral Daily   Continuous Infusions:     LOS: 4 days    Time spent: 45 minutes spent on chart review, discussion with nursing staff, consultants, updating family and interview/physical exam; more than 50% of that time was spent in counseling and/or coordination of care.    Geradine Girt, DO [facility] Available via Epic secure chat 7am-7pm After these hours, please refer to coverage provider listed on [email or web address] 05/09/2022, 11:12 AM

## 2022-05-09 NOTE — Progress Notes (Signed)
1/14 I spoke to patient's daughter Kennith Maes) via telephone. IMM Letter was explained and acknowledged. Letter will mailed to the patient's address on file.

## 2022-05-09 NOTE — TOC Progression Note (Signed)
Transition of Care [facility]) - Progression Note    Patient Details  Name: [name] [name] MRN: [id number] Date of Birth: 08/07/1924  [facility]) CM/SW Contact  Joanne Chars, LCSW Phone Number: 05/09/2022, 11:05 AM  Clinical Narrative:   CSW spoke to pt daughter [name] [name] by phone, presented bed offers.  She knows pt wants [facility] if at all possible.  She asked that pt be considered for CIR, CSW reached out to [facility], who reports pt is not a candidate for CIR.  See separate note.  CSW spoke with pt and son [name] [name] in room, bed offers provided with Medicare choice document.  Pt confirms that she would like response from [facility] but will look at other offers.  CSW has reached out to [facility] by phone and text to admissions and to front desk, no response.  Unsure if they are in the office due to the holiday.     Expected Discharge Plan: [place] Barriers to Discharge: Continued Medical Work up, SNF Pending bed offer  Expected Discharge Plan and Services     Post Acute Care Choice: [place] arrangements for the past 2 months: Single Family Home                                       Social Determinants of Health (SDOH) Interventions SDOH Screenings   Food Insecurity: No Food Insecurity (05/04/2022)  Housing: Low Risk  (05/04/2022)  Transportation Needs: No Transportation Needs (05/04/2022)  Utilities: Not At Risk (05/04/2022)  Depression (PHQ2-9): Low Risk  (04/26/2022)  Financial Resource Strain: Low Risk  (02/03/2022)  Physical Activity: Sufficiently Active (02/03/2022)  Social Connections: Moderately Integrated (02/03/2022)  Stress: No Stress Concern Present (02/03/2022)  Tobacco Use: Medium Risk (05/04/2022)    Readmission Risk Interventions     No data to display

## 2022-05-09 NOTE — Progress Notes (Signed)
Inpatient Rehab Admissions Coordinator:   Per [facility] and family request, patient was screened for CIR candidacy by Clemens Catholic, MS, [place]. At this time, Pt. does not appear to demonstrate medical necessity to justify in hospital rehabilitation/CIR. will not pursue a rehab consult for this Pt.   Recommend other rehab venues to be pursued.  Please contact me with any questions.  Clemens Catholic, [place], [place] Admissions Coordinator  [phone] ([place]) [phone] (office)

## 2022-05-10 DIAGNOSIS — K4090 Unilateral inguinal hernia, without obstruction or gangrene, not specified as recurrent: Secondary | ICD-10-CM | POA: Diagnosis not present

## 2022-05-10 DIAGNOSIS — N1831 Chronic kidney disease, stage 3a: Secondary | ICD-10-CM | POA: Diagnosis not present

## 2022-05-10 DIAGNOSIS — R310 Gross hematuria: Secondary | ICD-10-CM | POA: Diagnosis not present

## 2022-05-10 DIAGNOSIS — R278 Other lack of coordination: Secondary | ICD-10-CM | POA: Diagnosis not present

## 2022-05-10 DIAGNOSIS — F411 Generalized anxiety disorder: Secondary | ICD-10-CM | POA: Diagnosis not present

## 2022-05-10 DIAGNOSIS — E785 Hyperlipidemia, unspecified: Secondary | ICD-10-CM | POA: Diagnosis not present

## 2022-05-10 DIAGNOSIS — R42 Dizziness and giddiness: Secondary | ICD-10-CM | POA: Diagnosis not present

## 2022-05-10 DIAGNOSIS — R52 Pain, unspecified: Secondary | ICD-10-CM | POA: Diagnosis not present

## 2022-05-10 DIAGNOSIS — K625 Hemorrhage of anus and rectum: Secondary | ICD-10-CM | POA: Diagnosis not present

## 2022-05-10 DIAGNOSIS — G8929 Other chronic pain: Secondary | ICD-10-CM | POA: Diagnosis not present

## 2022-05-10 DIAGNOSIS — M6281 Muscle weakness (generalized): Secondary | ICD-10-CM | POA: Diagnosis not present

## 2022-05-10 DIAGNOSIS — M109 Gout, unspecified: Secondary | ICD-10-CM | POA: Diagnosis not present

## 2022-05-10 DIAGNOSIS — R2689 Other abnormalities of gait and mobility: Secondary | ICD-10-CM | POA: Diagnosis not present

## 2022-05-10 DIAGNOSIS — D6869 Other thrombophilia: Secondary | ICD-10-CM | POA: Diagnosis not present

## 2022-05-10 DIAGNOSIS — K559 Vascular disorder of intestine, unspecified: Secondary | ICD-10-CM | POA: Diagnosis not present

## 2022-05-10 DIAGNOSIS — Z95828 Presence of other vascular implants and grafts: Secondary | ICD-10-CM | POA: Diagnosis not present

## 2022-05-10 DIAGNOSIS — M5441 Lumbago with sciatica, right side: Secondary | ICD-10-CM | POA: Diagnosis not present

## 2022-05-10 DIAGNOSIS — J449 Chronic obstructive pulmonary disease, unspecified: Secondary | ICD-10-CM | POA: Diagnosis not present

## 2022-05-10 DIAGNOSIS — J301 Allergic rhinitis due to pollen: Secondary | ICD-10-CM | POA: Diagnosis not present

## 2022-05-10 DIAGNOSIS — G4730 Sleep apnea, unspecified: Secondary | ICD-10-CM | POA: Diagnosis not present

## 2022-05-10 DIAGNOSIS — I5032 Chronic diastolic (congestive) heart failure: Secondary | ICD-10-CM | POA: Diagnosis not present

## 2022-05-10 DIAGNOSIS — M25571 Pain in right ankle and joints of right foot: Secondary | ICD-10-CM | POA: Diagnosis not present

## 2022-05-10 DIAGNOSIS — R2681 Unsteadiness on feet: Secondary | ICD-10-CM | POA: Diagnosis not present

## 2022-05-10 DIAGNOSIS — R55 Syncope and collapse: Secondary | ICD-10-CM | POA: Diagnosis not present

## 2022-05-10 DIAGNOSIS — H8110 Benign paroxysmal vertigo, unspecified ear: Secondary | ICD-10-CM | POA: Diagnosis not present

## 2022-05-10 DIAGNOSIS — M1711 Unilateral primary osteoarthritis, right knee: Secondary | ICD-10-CM | POA: Diagnosis not present

## 2022-05-10 DIAGNOSIS — I10 Essential (primary) hypertension: Secondary | ICD-10-CM | POA: Diagnosis not present

## 2022-05-10 DIAGNOSIS — I4819 Other persistent atrial fibrillation: Secondary | ICD-10-CM | POA: Diagnosis not present

## 2022-05-10 DIAGNOSIS — G500 Trigeminal neuralgia: Secondary | ICD-10-CM | POA: Diagnosis not present

## 2022-05-10 DIAGNOSIS — I4821 Permanent atrial fibrillation: Secondary | ICD-10-CM | POA: Diagnosis not present

## 2022-05-10 MED ORDER — FUROSEMIDE 40 MG PO TABS: 40.0000 mg | ORAL_TABLET | Freq: Every day | ORAL | 11 refills | Status: DC | PRN

## 2022-05-10 MED ORDER — LISINOPRIL 5 MG PO TABS
5.0000 mg | ORAL_TABLET | Freq: Every day | ORAL | Status: DC
  Administered 2022-05-10: 5 mg via ORAL
  Filled 2022-05-10: qty 1

## 2022-05-10 MED ORDER — MECLIZINE HCL 25 MG PO TABS: 25.0000 mg | ORAL_TABLET | Freq: Three times a day (TID) | ORAL | 0 refills | Status: DC | PRN

## 2022-05-10 MED ORDER — MELATONIN 3 MG PO TABS: 6.0000 mg | ORAL_TABLET | Freq: Every evening | ORAL | 0 refills | Status: AC | PRN

## 2022-05-10 MED ORDER — ACETAMINOPHEN 500 MG PO TABS: 1000.0000 mg | ORAL_TABLET | Freq: Two times a day (BID) | ORAL | 0 refills | Status: AC

## 2022-05-10 MED ORDER — SENNOSIDES-DOCUSATE SODIUM 8.6-50 MG PO TABS: 1.0000 | ORAL_TABLET | Freq: Every evening | ORAL | Status: AC | PRN

## 2022-05-10 MED ORDER — DICLOFENAC SODIUM 1 % EX GEL: 2.0000 g | Freq: Four times a day (QID) | CUTANEOUS | Status: AC

## 2022-05-10 NOTE — TOC Progression Note (Addendum)
Transition of Care [facility]) - Progression Note    Patient Details  Name: ANISTEN TOMASSI MRN: [id number] Date of Birth: 1924-09-27  Transition of Care [facility]) CM/SW Contact  Joanne Chars, LCSW Phone Number: 05/10/2022, 11:55 AM  Clinical Narrative:   [facility] does offer bed, semi private is all that is available, CSW confirmed with pt and she does want to accept.  MD notified.   CSW spoke with son Nicki Reaper regarding family providing transportation and he is able to transport, will need help getting pt into the vehicle.  [place] notified as well.    Expected Discharge Plan: [place] Barriers to Discharge: Continued Medical Work up, SNF Pending bed offer  Expected Discharge Plan and Services     Post Acute Care Choice: [place] arrangements for the past 2 months: Single Family Home Expected Discharge Date: 05/10/22                                     Social Determinants of Health (SDOH) Interventions SDOH Screenings   Food Insecurity: No Food Insecurity (05/04/2022)  Housing: Low Risk  (05/04/2022)  Transportation Needs: No Transportation Needs (05/04/2022)  Utilities: Not At Risk (05/04/2022)  Depression (PHQ2-9): Low Risk  (04/26/2022)  Financial Resource Strain: Low Risk  (02/03/2022)  Physical Activity: Sufficiently Active (02/03/2022)  Social Connections: Moderately Integrated (02/03/2022)  Stress: No Stress Concern Present (02/03/2022)  Tobacco Use: Medium Risk (05/04/2022)    Readmission Risk Interventions     No data to display

## 2022-05-10 NOTE — Plan of Care (Signed)
  Problem: Education: Goal: Knowledge of General Education information will improve Description: Including pain rating scale, medication(s)/side effects and non-pharmacologic comfort measures Outcome: Progressing

## 2022-05-10 NOTE — TOC Transition Note (Signed)
Transition of Care [facility]) - CM/SW Discharge Note   Patient Details  Name: [name] [name] MRN: [id number] Date of Birth: [birth year]  Transition of Care [facility]) CM/SW Contact:  Joanne Chars, LCSW Phone Number: 05/10/2022, 12:59 PM   Clinical Narrative:   Pt discharging to [facility].  RN call [phone] for report.  Son Nicki Reaper will provide transportation and will need assistance with getting pt into his vehicle at main entrance.       Final next level of care: Skilled Nursing Facility Barriers to Discharge: Barriers Resolved   Patient Goals and CMS Choice   Choice offered to / list presented to : Adult Children  Discharge Placement                Patient chooses bed at:  [facility]) Patient to be transferred to facility by: son Nicki Reaper Name of family member notified: son Nicki Reaper in room Patient and family notified of of transfer: 05/10/22  Discharge Plan and Services Additional resources added to the After Visit Summary for       Post Acute Care Choice: [place]                               Social Determinants of Health (SDOH) Interventions SDOH Screenings   Food Insecurity: No Food Insecurity (05/04/2022)  Housing: Low Risk  (05/04/2022)  Transportation Needs: No Transportation Needs (05/04/2022)  Utilities: Not At Risk (05/04/2022)  Depression (PHQ2-9): Low Risk  (04/26/2022)  Financial Resource Strain: Low Risk  (02/03/2022)  Physical Activity: Sufficiently Active (02/03/2022)  Social Connections: Moderately Integrated (02/03/2022)  Stress: No Stress Concern Present (02/03/2022)  Tobacco Use: Medium Risk (05/04/2022)     Readmission Risk Interventions     No data to display

## 2022-05-10 NOTE — Plan of Care (Signed)
  Problem: Education: Goal: Knowledge of General Education information will improve Description: Including pain rating scale, medication(s)/side effects and non-pharmacologic comfort measures Outcome: Adequate for Discharge   Problem: Health Behavior/Discharge Planning: Goal: Ability to manage health-related needs will improve Outcome: Adequate for Discharge   Problem: Clinical Measurements: Goal: Ability to maintain clinical measurements within normal limits will improve Outcome: Adequate for Discharge Goal: Will remain free from infection Outcome: Adequate for Discharge Goal: Diagnostic test results will improve Outcome: Adequate for Discharge Goal: Cardiovascular complication will be avoided Outcome: Adequate for Discharge   Problem: Activity: Goal: Risk for activity intolerance will decrease Outcome: Adequate for Discharge   Problem: Nutrition: Goal: Adequate nutrition will be maintained Outcome: Adequate for Discharge   Problem: Coping: Goal: Level of anxiety will decrease Outcome: Adequate for Discharge   Problem: Elimination: Goal: Will not experience complications related to bowel motility Outcome: Adequate for Discharge Goal: Will not experience complications related to urinary retention Outcome: Adequate for Discharge   Problem: Pain Managment: Goal: General experience of comfort will improve Outcome: Adequate for Discharge   Problem: Safety: Goal: Ability to remain free from injury will improve Outcome: Adequate for Discharge   Problem: Skin Integrity: Goal: Risk for impaired skin integrity will decrease Outcome: Adequate for Discharge   Problem: Education: Goal: Knowledge of disease or condition will improve Outcome: Adequate for Discharge Goal: Knowledge of secondary prevention will improve (MUST DOCUMENT ALL) Outcome: Adequate for Discharge Goal: Knowledge of patient specific risk factors will improve [place] N/A or DELETE if not current risk  factor) Outcome: Adequate for Discharge   Problem: Ischemic Stroke/TIA Tissue Perfusion: Goal: Complications of ischemic stroke/TIA will be minimized Outcome: Adequate for Discharge   Problem: Coping: Goal: Will verbalize positive feelings about self Outcome: Adequate for Discharge Goal: Will identify appropriate support needs Outcome: Adequate for Discharge   Problem: Health Behavior/Discharge Planning: Goal: Ability to manage health-related needs will improve Outcome: Adequate for Discharge Goal: Goals will be collaboratively established with patient/family Outcome: Adequate for Discharge   Problem: Self-Care: Goal: Ability to participate in self-care as condition permits will improve Outcome: Adequate for Discharge Goal: Verbalization of feelings and concerns over difficulty with self-care will improve Outcome: Adequate for Discharge Goal: Ability to communicate needs accurately will improve Outcome: Adequate for Discharge   Problem: Nutrition: Goal: Risk of aspiration will decrease Outcome: Adequate for Discharge Goal: Dietary intake will improve Outcome: Adequate for Discharge   Problem: Acute Rehab PT Goals(only PT should resolve) Goal: Pt Will Go Supine/Side To Sit Outcome: Adequate for Discharge Goal: Pt Will Go Sit To Supine/Side Outcome: Adequate for Discharge Goal: Patient Will Transfer Sit To/From Stand Outcome: Adequate for Discharge Goal: Pt Will Ambulate Outcome: Adequate for Discharge

## 2022-05-10 NOTE — Discharge Summary (Signed)
\     Physician Discharge Summary  [name] [name] XLK:[id number] DOB: 1924-11-07 DOA: 05/04/2022  PCP: Marin Olp, MD  Admit date: 05/04/2022 Discharge date: 05/10/2022  Admitted From: home Discharge disposition: SNF   Recommendations for Outpatient Follow-Up:   Continue vestibular rehab   Discharge Diagnosis:   Principal Problem:   Vertigo Active Problems:   Hyperlipidemia   Essential hypertension   Peripheral arterial disease (HCC)   COPD mixed type (HCC)   Chronic low back pain   CKD (chronic kidney disease), stage III ([place])   Persistent atrial fibrillation (HCC)   Secondary hypercoagulable state ([place])   Chronic diastolic heart failure ([place])   Dizziness    Discharge Condition: Improved.  Diet recommendation:  Regular.  Wound care: None.  Code status: Full.   History of Present Illness:   This is a 89 year old female with past medical history chronic diastolic heart failure, COPD, atrial fibrillation, hyperlipidemia, and hypertension.  She presents with a complaint of being extremely hydrated, starting 2 days ago.  She states additionally it was mild but as the days progressed so that her symptoms.  This morning when she got up she could not stand up because the world seems to go around her.  She could not focus her eyes.  She called her daughter.  After her daughter came she became nauseous and vomited.  After that she had dry heaves.  Whenever she moves her head elevated she became very dizzy.  She denies tinnitus.  She has had progressive hearing loss over the past few years.  She denies floaters.  She states her blood pressure is typically normal systolic blood pressures in the 110s.  Her daughter took her to [facility] she was given IV Zofran, to me she states that the Zofran helped, so the EDP she stated that they did not.  She was then given IV fluid bolus 400 cc NS and IV Valium.  Orthostatic vitals done she was reportedly positive  (I am unable to find that recorded).  Dix-Hallpike maneuver was also positive.  Transfer requested   Hospital Course by Problem:   Dizziness-- vertigo -MRI negative - orthostatic vitals negative -Meclizine PRN -continue vestibular rehab   Hyperlipidemia -Stable, atorvastatin resumed   Essential hypertension -Metoprolol --increased dose as able -resume other meds   COPD mixed type (HCC) -Nebulizers as needed   CKD (chronic kidney disease), stage IIIa (HCC) -outpatient follow up   Permanent atrial fibrillation (HCC) -Continue Toprol  and Eliquis   Dyphagia -SLp eval done   GErd -protonix   Right ankle pain -dg ankle- no fx -voltaren gel-- showed improvement in pain -can use ankle brace when getting up     Obesity Estimated body mass index is 33.53 kg/m as calculated from the following:   Height as of this encounter: '4\' 11"'$  (1.499 m).   Weight as of this encounter: 75.3 kg.     Medical Consultants:      Discharge Exam:   Vitals:   05/09/22 2103 05/10/22 0728  BP: 135/86 (!) 154/90  Pulse: 100 100  Resp:  17  Temp: 98.3 F (36.8 C) 98.6 F (37 C)  SpO2: 94% 97%   Vitals:   05/09/22 1024 05/09/22 1332 05/09/22 2103 05/10/22 0728  BP: 133/62 125/63 135/86 (!) 154/90  Pulse: 99 93 100 100  Resp:  18  17  Temp:  97.6 F (36.4 C) 98.3 F (36.8 C) 98.6 F (37 C)  TempSrc:  Oral Oral Oral  SpO2:  93% 94% 97%  Weight:      Height:        General exam: Appears calm and comfortable.    The results of significant diagnostics from this hospitalization (including imaging, microbiology, ancillary and laboratory) are listed below for reference.     Procedures and Diagnostic Studies:   ECHOCARDIOGRAM COMPLETE  Result Date: 05/05/2022    ECHOCARDIOGRAM REPORT   Patient Name:   [name] [name] Date of Exam: 05/05/2022 Medical Rec #:  [id number]        Height:       59.0 in Accession #:    [id number]       Weight:       166.0 lb Date of Birth:   February 24, 1925        BSA:          1.704 m Patient Age:    89 years         BP:           120/79 mmHg Patient Gender: F                HR:           91 bpm. Exam Location:  Inpatient Procedure: 2D Echo, 3D Echo, Cardiac Doppler and Color Doppler Indications:    TIA G45.9  History:        Patient has prior history of Echocardiogram examinations, most                 recent 10/20/2021. COPD and PAD, Arrythmias:Atrial Fibrillation;                 Risk Factors:Former Smoker, Hypertension and Dyslipidemia.  Sonographer:    Darlina Sicilian RDCS Referring Phys: [place]  1. Left ventricular ejection fraction, by estimation, is 65 to 70%. The left ventricle has normal function. The left ventricle has no regional wall motion abnormalities. Left ventricular diastolic parameters are indeterminate.  2. Right ventricular systolic function is low normal. The right ventricular size is normal. There is normal pulmonary artery systolic pressure.  3. The mitral valve is degenerative. Trivial mitral valve regurgitation. Mild mitral stenosis. The mean mitral valve gradient is 2.0 mmHg.  4. The aortic valve is calcified. Aortic valve regurgitation is not visualized. Aortic valve sclerosis/calcification is present, without any evidence of aortic stenosis. Aortic valve mean gradient measures 4.5 mmHg. Aortic valve Vmax measures 1.43 m/s.  5. The inferior vena cava is normal in size with greater than 50% respiratory variability, suggesting right atrial pressure of 3 mmHg. FINDINGS  Left Ventricle: Left ventricular ejection fraction, by estimation, is 65 to 70%. The left ventricle has normal function. The left ventricle has no regional wall motion abnormalities. The left ventricular internal cavity size was normal in size. There is  no left ventricular hypertrophy. Left ventricular diastolic parameters are indeterminate. Right Ventricle: The right ventricular size is normal. No increase in right ventricular wall thickness.  Right ventricular systolic function is low normal. There is normal pulmonary artery systolic pressure. The tricuspid regurgitant velocity is 2.59 m/s,  and with an assumed right atrial pressure of 3 mmHg, the estimated right ventricular systolic pressure is 43.3 mmHg. Left Atrium: Left atrial size was normal in size. Right Atrium: Right atrial size was normal in size. Pericardium: There is no evidence of pericardial effusion. Presence of epicardial fat layer. Mitral Valve: The mitral valve is degenerative in appearance. There is moderate thickening of the  mitral valve leaflet(s). There is moderate calcification of the mitral valve leaflet(s). Moderately decreased mobility of the mitral valve leaflets. Mild to  moderate mitral annular calcification. Trivial mitral valve regurgitation. Mild mitral valve stenosis. MV peak gradient, 6.3 mmHg. The mean mitral valve gradient is 2.0 mmHg. Tricuspid Valve: The tricuspid valve is grossly normal. Tricuspid valve regurgitation is mild . No evidence of tricuspid stenosis. Aortic Valve: The aortic valve is calcified. Aortic valve regurgitation is not visualized. Aortic valve sclerosis/calcification is present, without any evidence of aortic stenosis. Aortic valve mean gradient measures 4.5 mmHg. Aortic valve peak gradient measures 8.2 mmHg. Pulmonic Valve: The pulmonic valve was not well visualized. Pulmonic valve regurgitation is not visualized. No evidence of pulmonic stenosis. Aorta: The aortic root and ascending aorta are structurally normal, with no evidence of dilitation. Venous: The inferior vena cava is normal in size with greater than 50% respiratory variability, suggesting right atrial pressure of 3 mmHg. IAS/Shunts: No atrial level shunt detected by color flow Doppler.  LEFT VENTRICLE PLAX 2D LVIDd:         4.20 cm   Diastology LVIDs:         2.00 cm   LV e' medial:    3.87 cm/s LV PW:         0.90 cm   LV E/e' medial:  34.2 LV IVS:        1.00 cm   LV e' lateral:    6.55 cm/s LVOT diam:     1.60 cm   LV E/e' lateral: 20.2 LVOT Area:     2.01 cm                           3D Volume EF:                          3D EF:        54 %                          LV EDV:       53 ml                          LV ESV:       24 ml                          LV SV:        29 ml RIGHT VENTRICLE RV S prime:     8.11 cm/s TAPSE (M-mode): 1.2 cm LEFT ATRIUM             Index        RIGHT ATRIUM           Index LA diam:        4.20 cm 2.46 cm/m   RA Area:     11.00 cm LA Vol (A2C):   38.1 ml 22.36 ml/m  RA Volume:   19.70 ml  11.56 ml/m LA Vol (A4C):   45.2 ml 26.53 ml/m LA Biplane Vol: 43.5 ml 25.53 ml/m  AORTIC VALVE AV Vmax:      143.50 cm/s AV Vmean:     100.170 cm/s AV VTI:       0.302 m AV Peak Grad: 8.2 mmHg AV Mean Grad: 4.5 mmHg  AORTA Ao Root diam: 3.30 cm  Ao Asc diam:  3.20 cm MITRAL VALVE                TRICUSPID VALVE MV Area (PHT): 4.12 cm     TR Peak grad:   26.8 mmHg MV Peak grad:  6.3 mmHg     TR Vmax:        259.00 cm/s MV Mean grad:  2.0 mmHg MV Vmax:       1.25 m/s     SHUNTS MV Vmean:      61.5 cm/s    Systemic Diam: 1.60 cm MV Decel Time: 184 msec MV E velocity: 132.33 cm/s Dorris Carnes MD Electronically signed by Dorris Carnes MD Signature Date/Time: 05/05/2022/9:19:35 AM    Final    MR BRAIN WO CONTRAST  Result Date: 05/05/2022 CLINICAL DATA:  Initial evaluation for dizziness. EXAM: MRI HEAD WITHOUT CONTRAST TECHNIQUE: Multiplanar, multiecho pulse sequences of the brain and surrounding structures were obtained without intravenous contrast. COMPARISON:  Prior CT from earlier the same day. FINDINGS: Brain: Cerebral volume within normal limits. Mild scattered patchy T2/FLAIR hyperintensity noted involving the periventricular and deep white matter both cerebral hemispheres, overall mild in nature, and less than is typically seen for age. No evidence for acute or subacute ischemia. Note made of a tiny apparent punctate focus of diffusion signal abnormality at the right ventral  cervicomedullary junction on axial DWI sequence (series 2, image 7), not seen on corresponding sequences, and favored to be artifactual. Gray-white matter differentiation maintained. No areas of chronic cortical infarction. No acute or chronic intracranial blood products. No mass lesion, midline shift or mass effect no hydrocephalus or extra-axial fluid collection. Pituitary gland and suprasellar region within normal limits. Vascular: Major intracranial vascular flow voids are maintained. Skull and upper cervical spine: Craniocervical junction normal. Bone marrow signal intensity within normal limits. No scalp soft tissue abnormality. Sinuses/Orbits: Prior bowel ocular lens replacement. Paranasal sinuses are largely clear. Trace left mastoid effusion noted, of doubtful significance. Other: None. IMPRESSION: Normal brain MRI for age.  No acute intracranial abnormality. Electronically Signed   By: Jeannine Boga M.D.   On: 05/05/2022 01:38   CT Head Wo Contrast  Result Date: 05/04/2022 CLINICAL DATA:  Worsening dizziness, nausea and weakness when standing. EXAM: CT HEAD WITHOUT CONTRAST TECHNIQUE: Contiguous axial images were obtained from the base of the skull through the vertex without intravenous contrast. RADIATION DOSE REDUCTION: This exam was performed according to the departmental dose-optimization program which includes automated exposure control, adjustment of the mA and/or kV according to patient size and/or use of iterative reconstruction technique. COMPARISON:  None Available. FINDINGS: Brain: No evidence of acute infarction, hemorrhage, hydrocephalus, extra-axial collection or mass lesion/mass effect. There is mild cerebral volume loss with associated ex vacuo dilatation. Periventricular white matter hypoattenuation likely represents chronic small vessel ischemic disease. Vascular: There are vascular calcifications in the carotid siphons. Skull: Normal. Negative for fracture or focal lesion.  Sinuses/Orbits: No acute finding. Other: None. IMPRESSION: No acute intracranial process. Electronically Signed   By: Zerita Boers M.D.   On: 05/04/2022 12:36     Labs:   Basic Metabolic Panel: Recent Labs  Lab 05/04/22 1140 05/05/22 0652 05/06/22 0418 05/08/22 0259  NA 142  --  140 138  K 3.7  --  3.8 4.0  CL 102  --  103 102  CO2 29  --  28 27  GLUCOSE 140*  --  104* 123*  BUN 21  --  14 13  CREATININE 0.96  0.95 0.92 0.95  CALCIUM 8.7*  --  8.8* 8.8*   GFR Estimated Creatinine Clearance: 31.3 mL/min (by C-G formula based on SCr of 0.95 mg/dL). Liver Function Tests: Recent Labs  Lab 05/04/22 1140  AST 16  ALT 8  ALKPHOS 62  BILITOT 0.8  PROT 6.0*  ALBUMIN 3.9   Recent Labs  Lab 05/04/22 1140  LIPASE 25   No results for input(s): "AMMONIA" in the last 168 hours. Coagulation profile No results for input(s): "INR", "PROTIME" in the last 168 hours.  CBC: Recent Labs  Lab 05/04/22 1140 05/05/22 0652 05/06/22 0418 05/08/22 0259  WBC 8.9 7.2 7.6 10.3  NEUTROABS 6.5  --   --   --   HGB 12.6 12.4 12.6 12.5  HCT 37.6 35.9* 37.6 38.6  MCV 86.8 86.7 87.4 88.3  PLT 128* 120* 121* 129*   Cardiac Enzymes: No results for input(s): "CKTOTAL", "CKMB", "CKMBINDEX", "TROPONINI" in the last 168 hours. BNP: Invalid input(s): "POCBNP" CBG: No results for input(s): "GLUCAP" in the last 168 hours. D-Dimer No results for input(s): "DDIMER" in the last 72 hours. Hgb A1c No results for input(s): "HGBA1C" in the last 72 hours. Lipid Profile No results for input(s): "CHOL", "HDL", "LDLCALC", "TRIG", "CHOLHDL", "LDLDIRECT" in the last 72 hours. Thyroid function studies No results for input(s): "TSH", "T4TOTAL", "T3FREE", "THYROIDAB" in the last 72 hours.  Invalid input(s): "FREET3" Anemia work up No results for input(s): "VITAMINB12", "FOLATE", "FERRITIN", "TIBC", "IRON", "RETICCTPCT" in the last 72 hours. Microbiology Recent Results (from the past 240 hour(s))  Resp  panel by RT-PCR (RSV, Flu A&B, Covid) Anterior Nasal Swab     Status: None   Collection Time: 05/04/22 11:50 AM   Specimen: Anterior Nasal Swab  Result Value Ref Range Status   SARS Coronavirus 2 by RT PCR NEGATIVE NEGATIVE Final    Comment: (NOTE) SARS-CoV-2 target nucleic acids are NOT DETECTED.  The SARS-CoV-2 RNA is generally detectable in upper respiratory specimens during the acute phase of infection. The lowest concentration of SARS-CoV-2 viral copies this assay can detect is 138 copies/mL. A negative result does not preclude SARS-Cov-2 infection and should not be used as the sole basis for treatment or other patient management decisions. A negative result may occur with  improper specimen collection/handling, submission of specimen other than nasopharyngeal swab, presence of viral mutation(s) within the areas targeted by this assay, and inadequate number of viral copies(<138 copies/mL). A negative result must be combined with clinical observations, patient history, and epidemiological information. The expected result is Negative.  Fact Sheet for Patients:  EntrepreneurPulse.com.au  Fact Sheet for Healthcare Providers:  IncredibleEmployment.be  This test is no t yet approved or cleared by the [place] FDA and  has been authorized for detection and/or diagnosis of SARS-CoV-2 by FDA under an Emergency Use Authorization (EUA). This EUA will remain  in effect (meaning this test can be used) for the duration of the COVID-19 declaration under Section 564(b)(1) of the Act, 21 U.S.C.section 360bbb-3(b)(1), unless the authorization is terminated  or revoked sooner.       Influenza A by PCR NEGATIVE NEGATIVE Final   Influenza B by PCR NEGATIVE NEGATIVE Final    Comment: (NOTE) The Xpert Xpress SARS-CoV-2/FLU/RSV plus assay is intended as an aid in the diagnosis of influenza from Nasopharyngeal swab specimens and should not be used as a  sole basis for treatment. Nasal washings and aspirates are unacceptable for Xpert Xpress SARS-CoV-2/FLU/RSV testing.  Fact Sheet for Patients: EntrepreneurPulse.com.au  Fact  Sheet for Healthcare Providers: IncredibleEmployment.be  This test is not yet approved or cleared by the [place] and has been authorized for detection and/or diagnosis of SARS-CoV-2 by FDA under an Emergency Use Authorization (EUA). This EUA will remain in effect (meaning this test can be used) for the duration of the COVID-19 declaration under Section 564(b)(1) of the Act, 21 U.S.C. section 360bbb-3(b)(1), unless the authorization is terminated or revoked.     Resp Syncytial Virus by PCR NEGATIVE NEGATIVE Final    Comment: (NOTE) Fact Sheet for Patients: EntrepreneurPulse.com.au  Fact Sheet for Healthcare Providers: IncredibleEmployment.be  This test is not yet approved or cleared by the [place] FDA and has been authorized for detection and/or diagnosis of SARS-CoV-2 by FDA under an Emergency Use Authorization (EUA). This EUA will remain in effect (meaning this test can be used) for the duration of the COVID-19 declaration under Section 564(b)(1) of the Act, 21 U.S.C. section 360bbb-3(b)(1), unless the authorization is terminated or revoked.  Performed at [facility], [address], [place], [place] [address]      Discharge Instructions:   Discharge Instructions     Diet - low sodium heart healthy   Complete by: As directed    Increase activity slowly   Complete by: As directed       Allergies as of 05/10/2022       Reactions   Sulfa Antibiotics Other (See Comments)   Cold sweat light headed and disorientation   Tiotropium Bromide Shortness Of Breath, Other (See Comments)   Sore throat also   Latex Itching, Rash   Ultram [tramadol] Nausea And Vomiting   5-alpha Reductase  Inhibitors         Medication List     STOP taking these medications    baclofen 10 MG tablet Commonly known as: LIORESAL       TAKE these medications    acetaminophen 500 MG tablet Commonly known as: TYLENOL Take 2 tablets (1,000 mg total) by mouth 2 (two) times daily. What changed:  medication strength how much to take when to take this reasons to take this   atorvastatin 40 MG tablet Commonly known as: LIPITOR Take 1 tablet by mouth once daily   diclofenac Sodium 1 % Gel Commonly known as: VOLTAREN Apply 2 g topically 4 (four) times daily.   Eliquis 5 MG Tabs tablet Generic drug: apixaban Take 1 tablet by mouth twice daily   famotidine 20 MG tablet Commonly known as: PEPCID Take '20mg'$  in the evening for 1 week. Then take as needed for indigestion.   furosemide 40 MG tablet Commonly known as: LASIX Take 1 tablet (40 mg total) by mouth daily as needed for edema. What changed:  when to take this reasons to take this   ipratropium 0.06 % nasal spray Commonly known as: ATROVENT USE 2 SPRAY(S) IN EACH NOSTRIL TWICE DAILY   lidocaine 5 % Commonly known as: Lidoderm Place 1 patch onto the skin daily. Remove & Discard patch within 12 hours or as directed by MD   lisinopril 5 MG tablet Commonly known as: ZESTRIL Take 1 tablet (5 mg total) by mouth daily.   meclizine 25 MG tablet Commonly known as: ANTIVERT Take 1 tablet (25 mg total) by mouth 3 (three) times daily as needed for dizziness.   melatonin 3 MG Tabs tablet Take 2 tablets (6 mg total) by mouth at bedtime as needed (sleep).   metoprolol succinate 100 MG 24 hr tablet Commonly known as:  TOPROL-XL Take 1 tablet (100 mg total) by mouth daily. Take with or immediately following a meal.   multivitamin with minerals Tabs tablet Take 1 tablet by mouth daily.   pantoprazole 40 MG tablet Commonly known as: PROTONIX TAKE 1 TABLET BY MOUTH TWICE DAILY AS DIRECTED   Restasis 0.05 % ophthalmic  emulsion Generic drug: cycloSPORINE Place 1 drop into both eyes 2 (two) times daily as needed (dry eyes).   senna-docusate 8.6-50 MG tablet Commonly known as: Senokot-S Take 1 tablet by mouth at bedtime as needed for mild constipation.          Time coordinating discharge: 45 min  Signed:  Geradine Girt DO  [facility] 05/10/2022, 9:54 AM

## 2022-05-10 NOTE — Progress Notes (Signed)
Patient and son given discharge instructions and verbalized understanding. Patient provided a copy of AVS and given a copy to provide staff on arrival at [facility]. Patient assisted with dressing and discharged with son.

## 2022-05-12 DIAGNOSIS — J449 Chronic obstructive pulmonary disease, unspecified: Secondary | ICD-10-CM | POA: Diagnosis not present

## 2022-05-12 DIAGNOSIS — I10 Essential (primary) hypertension: Secondary | ICD-10-CM | POA: Diagnosis not present

## 2022-05-12 DIAGNOSIS — R42 Dizziness and giddiness: Secondary | ICD-10-CM | POA: Diagnosis not present

## 2022-05-12 DIAGNOSIS — I4821 Permanent atrial fibrillation: Secondary | ICD-10-CM | POA: Diagnosis not present

## 2022-05-16 ENCOUNTER — Other Ambulatory Visit: Payer: Self-pay | Admitting: *Deleted

## 2022-05-16 NOTE — Patient Outreach (Signed)
Mrs. Wellnitz resides in [facility] skilled nursing facility. Screening for potential [place] care coordination services as benefit of insurance plan and Primary Care Provider.   Communication with Earnest Bailey, [place] worker, to make aware writer is following for transition plans/needs.   Marthenia Rolling, MSN, RN,BSN [place] Acute Care Coordinator [phone] (Direct dial)

## 2022-05-18 DIAGNOSIS — I5032 Chronic diastolic (congestive) heart failure: Secondary | ICD-10-CM | POA: Diagnosis not present

## 2022-05-18 DIAGNOSIS — H8110 Benign paroxysmal vertigo, unspecified ear: Secondary | ICD-10-CM | POA: Diagnosis not present

## 2022-05-18 DIAGNOSIS — E785 Hyperlipidemia, unspecified: Secondary | ICD-10-CM | POA: Diagnosis not present

## 2022-05-18 DIAGNOSIS — I10 Essential (primary) hypertension: Secondary | ICD-10-CM | POA: Diagnosis not present

## 2022-05-20 DIAGNOSIS — N1831 Chronic kidney disease, stage 3a: Secondary | ICD-10-CM | POA: Diagnosis not present

## 2022-05-27 DIAGNOSIS — N1831 Chronic kidney disease, stage 3a: Secondary | ICD-10-CM | POA: Diagnosis not present

## 2022-05-27 DIAGNOSIS — J449 Chronic obstructive pulmonary disease, unspecified: Secondary | ICD-10-CM | POA: Diagnosis not present

## 2022-05-27 DIAGNOSIS — F411 Generalized anxiety disorder: Secondary | ICD-10-CM | POA: Diagnosis not present

## 2022-05-27 DIAGNOSIS — K559 Vascular disorder of intestine, unspecified: Secondary | ICD-10-CM | POA: Diagnosis not present

## 2022-05-27 DIAGNOSIS — G8929 Other chronic pain: Secondary | ICD-10-CM | POA: Diagnosis not present

## 2022-05-27 DIAGNOSIS — D6869 Other thrombophilia: Secondary | ICD-10-CM | POA: Diagnosis not present

## 2022-05-27 DIAGNOSIS — M1711 Unilateral primary osteoarthritis, right knee: Secondary | ICD-10-CM | POA: Diagnosis not present

## 2022-05-27 DIAGNOSIS — M1030 Gout due to renal impairment, unspecified site: Secondary | ICD-10-CM | POA: Diagnosis not present

## 2022-05-27 DIAGNOSIS — K5790 Diverticulosis of intestine, part unspecified, without perforation or abscess without bleeding: Secondary | ICD-10-CM | POA: Diagnosis not present

## 2022-05-27 DIAGNOSIS — M4726 Other spondylosis with radiculopathy, lumbar region: Secondary | ICD-10-CM | POA: Diagnosis not present

## 2022-05-27 DIAGNOSIS — K4090 Unilateral inguinal hernia, without obstruction or gangrene, not specified as recurrent: Secondary | ICD-10-CM | POA: Diagnosis not present

## 2022-05-27 DIAGNOSIS — M48061 Spinal stenosis, lumbar region without neurogenic claudication: Secondary | ICD-10-CM | POA: Diagnosis not present

## 2022-05-27 DIAGNOSIS — H8110 Benign paroxysmal vertigo, unspecified ear: Secondary | ICD-10-CM | POA: Diagnosis not present

## 2022-05-27 DIAGNOSIS — I739 Peripheral vascular disease, unspecified: Secondary | ICD-10-CM | POA: Diagnosis not present

## 2022-05-27 DIAGNOSIS — E669 Obesity, unspecified: Secondary | ICD-10-CM | POA: Diagnosis not present

## 2022-05-27 DIAGNOSIS — G4730 Sleep apnea, unspecified: Secondary | ICD-10-CM | POA: Diagnosis not present

## 2022-05-27 DIAGNOSIS — I130 Hypertensive heart and chronic kidney disease with heart failure and stage 1 through stage 4 chronic kidney disease, or unspecified chronic kidney disease: Secondary | ICD-10-CM | POA: Diagnosis not present

## 2022-05-27 DIAGNOSIS — I5033 Acute on chronic diastolic (congestive) heart failure: Secondary | ICD-10-CM | POA: Diagnosis not present

## 2022-05-27 DIAGNOSIS — G500 Trigeminal neuralgia: Secondary | ICD-10-CM | POA: Diagnosis not present

## 2022-05-27 DIAGNOSIS — M8580 Other specified disorders of bone density and structure, unspecified site: Secondary | ICD-10-CM | POA: Diagnosis not present

## 2022-05-27 DIAGNOSIS — K219 Gastro-esophageal reflux disease without esophagitis: Secondary | ICD-10-CM | POA: Diagnosis not present

## 2022-05-27 DIAGNOSIS — K222 Esophageal obstruction: Secondary | ICD-10-CM | POA: Diagnosis not present

## 2022-05-27 DIAGNOSIS — R1310 Dysphagia, unspecified: Secondary | ICD-10-CM | POA: Diagnosis not present

## 2022-05-27 DIAGNOSIS — E785 Hyperlipidemia, unspecified: Secondary | ICD-10-CM | POA: Diagnosis not present

## 2022-05-27 DIAGNOSIS — M5116 Intervertebral disc disorders with radiculopathy, lumbar region: Secondary | ICD-10-CM | POA: Diagnosis not present

## 2022-05-30 DIAGNOSIS — J449 Chronic obstructive pulmonary disease, unspecified: Secondary | ICD-10-CM | POA: Diagnosis not present

## 2022-05-30 DIAGNOSIS — I130 Hypertensive heart and chronic kidney disease with heart failure and stage 1 through stage 4 chronic kidney disease, or unspecified chronic kidney disease: Secondary | ICD-10-CM | POA: Diagnosis not present

## 2022-05-30 DIAGNOSIS — M1030 Gout due to renal impairment, unspecified site: Secondary | ICD-10-CM | POA: Diagnosis not present

## 2022-05-30 DIAGNOSIS — K559 Vascular disorder of intestine, unspecified: Secondary | ICD-10-CM | POA: Diagnosis not present

## 2022-05-30 DIAGNOSIS — I5033 Acute on chronic diastolic (congestive) heart failure: Secondary | ICD-10-CM | POA: Diagnosis not present

## 2022-05-30 DIAGNOSIS — N1831 Chronic kidney disease, stage 3a: Secondary | ICD-10-CM | POA: Diagnosis not present

## 2022-05-30 NOTE — Telephone Encounter (Signed)
If she still in a skilled nursing facility?  Or has she been discharged home?  We can certainly see her for a visit if back home and feels more confident getting into the office  They have a different PCP listed so please clarify on that

## 2022-05-30 NOTE — Telephone Encounter (Signed)
Patient states: [employer] stated she will need to inform PCP that she was diagnosed with vertigo.  - She was not told to have a hospital follow up following discharge on 05/10/22

## 2022-05-30 NOTE — Telephone Encounter (Signed)
FYI

## 2022-06-01 DIAGNOSIS — N1831 Chronic kidney disease, stage 3a: Secondary | ICD-10-CM | POA: Diagnosis not present

## 2022-06-01 DIAGNOSIS — M1030 Gout due to renal impairment, unspecified site: Secondary | ICD-10-CM | POA: Diagnosis not present

## 2022-06-01 DIAGNOSIS — I130 Hypertensive heart and chronic kidney disease with heart failure and stage 1 through stage 4 chronic kidney disease, or unspecified chronic kidney disease: Secondary | ICD-10-CM | POA: Diagnosis not present

## 2022-06-01 DIAGNOSIS — I5033 Acute on chronic diastolic (congestive) heart failure: Secondary | ICD-10-CM | POA: Diagnosis not present

## 2022-06-01 DIAGNOSIS — K559 Vascular disorder of intestine, unspecified: Secondary | ICD-10-CM | POA: Diagnosis not present

## 2022-06-01 DIAGNOSIS — J449 Chronic obstructive pulmonary disease, unspecified: Secondary | ICD-10-CM | POA: Diagnosis not present

## 2022-06-02 ENCOUNTER — Telehealth: Payer: Self-pay | Admitting: Pharmacist

## 2022-06-02 NOTE — Telephone Encounter (Signed)
If she wants to see [place] prior to may visit- happy to work her in . Jim Desanctis to still be her pcp!

## 2022-06-02 NOTE — Progress Notes (Signed)
Care Management & Coordination Services Pharmacy Team  Reason for Encounter: CHF Adherence Call   Contacted patient for general health update and medication adherence call.  Spoke with patient on 06/02/2022    Have you had any of the following symptoms:   Feeling dizzy or lightheaded? Patient states she recently had an MRI that showed she has vertigo.  Weight gain of three or more pounds in one day? No  Weight gain of five pounds in one week? No  Unusual swelling in the legs, feet, hands, or abdomen? Some  A persistent cough or chest congestion (the cough may be dry or hacking)? No  Increasing fatigue or a sudden decrease in your ability to do normal activities? Yes, patient has been in bed for 3 weeks and is now walking with a walker. - Patient states she is still recovering from her recent hospital stay.  A loss of appetite or nausea? No  A feeling of fullness or bloating in your stomach? No  Confusion or restlessness? No  Chart Updates:  Recent office visits:  None  Recent consult visits:  04/26/2022 OV (Phys Med) Izora Ribas, MD; no medication changes indicated.  Hospital visits:  05/04/2022 ED to Hospital Admission due to orthostatic hypotension -Metoprolol --increased dose as able -resume other meds  Medications: Outpatient Encounter Medications as of 06/02/2022  Medication Sig Note   acetaminophen (TYLENOL) 500 MG tablet Take 2 tablets (1,000 mg total) by mouth 2 (two) times daily.    apixaban (ELIQUIS) 5 MG TABS tablet Take 1 tablet by mouth twice daily    atorvastatin (LIPITOR) 40 MG tablet Take 1 tablet by mouth once daily    diclofenac Sodium (VOLTAREN) 1 % GEL Apply 2 g topically 4 (four) times daily.    famotidine (PEPCID) 20 MG tablet Take 35m in the evening for 1 week. Then take as needed for indigestion.    furosemide (LASIX) 40 MG tablet Take 1 tablet (40 mg total) by mouth daily as needed for edema.    ipratropium (ATROVENT) 0.06 % nasal spray  USE 2 SPRAY(S) IN EACH NOSTRIL TWICE DAILY 05/04/2022: prn   lidocaine (LIDODERM) 5 % Place 1 patch onto the skin daily. Remove & Discard patch within 12 hours or as directed by MD 05/04/2022: prn   lisinopril (ZESTRIL) 5 MG tablet Take 1 tablet (5 mg total) by mouth daily.    meclizine (ANTIVERT) 25 MG tablet Take 1 tablet (25 mg total) by mouth 3 (three) times daily as needed for dizziness.    melatonin 3 MG TABS tablet Take 2 tablets (6 mg total) by mouth at bedtime as needed (sleep).    metoprolol succinate (TOPROL-XL) 100 MG 24 hr tablet Take 1 tablet (100 mg total) by mouth daily. Take with or immediately following a meal.    Multiple Vitamin (MULTIVITAMIN WITH MINERALS) TABS tablet Take 1 tablet by mouth daily.    pantoprazole (PROTONIX) 40 MG tablet TAKE 1 TABLET BY MOUTH TWICE DAILY AS DIRECTED    RESTASIS 0.05 % ophthalmic emulsion Place 1 drop into both eyes 2 (two) times daily as needed (dry eyes).     senna-docusate (SENOKOT-S) 8.6-50 MG tablet Take 1 tablet by mouth at bedtime as needed for mild constipation.    No facility-administered encounter medications on file as of 06/02/2022.    Recent vitals BP Readings from Last 3 Encounters:  05/10/22 (!) 154/90  04/26/22 133/83  03/01/22 (!) 150/99   Pulse Readings from Last 3 Encounters:  05/10/22 100  04/26/22 86  03/01/22 96   Wt Readings from Last 3 Encounters:  05/04/22 166 lb 0.1 oz (75.3 kg)  04/26/22 166 lb (75.3 kg)  03/01/22 164 lb (74.4 kg)   BMI Readings from Last 3 Encounters:  05/04/22 33.53 kg/m  04/26/22 33.53 kg/m  03/01/22 33.12 kg/m    Recent lab results    Component Value Date/Time   NA 138 05/08/2022 0259   NA 140 02/24/2022 0947   K 4.0 05/08/2022 0259   CL 102 05/08/2022 0259   CO2 27 05/08/2022 0259   GLUCOSE 123 (H) 05/08/2022 0259   BUN 13 05/08/2022 0259   BUN 36 02/24/2022 0947   CREATININE 0.95 05/08/2022 0259   CREATININE 1.19 (H) 02/19/2020 1347   CALCIUM 8.8 (L) 05/08/2022 0259     Lab Results  Component Value Date   CREATININE 0.95 05/08/2022   GFR 34.71 (L) 01/12/2022   EGFR 36 (L) 02/24/2022   GFRNONAA 55 (L) 05/08/2022   GFRAA 46 (L) 02/19/2020   Lab Results  Component Value Date/Time   HGBA1C 6.0 (H) 05/05/2022 06:52 AM   HGBA1C 6.1 08/20/2020 11:59 AM    Lab Results  Component Value Date   CHOL 106 05/06/2022   HDL 46 05/06/2022   LDLCALC 37 05/06/2022   LDLDIRECT 46.0 06/10/2019   TRIG 116 05/06/2022   CHOLHDL 2.3 05/06/2022    Care Gaps: Annual wellness visit in last year? Yes  Star Rating Drugs:  Atorvastatin 40 mg last filled 03/14/2022 90 DS Lisinopril 5 mg last filled 04/27/2022 90 DS  Future Appointments  Date Time Provider [place]  06/30/2022 11:00 AM Raulkar, Clide Deutscher, MD [facility] CPR  07/12/2022  4:00 PM Edythe Clarity, [place] None  08/26/2022 10:00 AM Marin Olp, MD [facility] PEC  02/10/2023  1:00 PM [place]   April D Calhoun, [place] Pharmacist Assistant [phone]

## 2022-06-02 NOTE — Telephone Encounter (Signed)
Called and spoek with pt and Pt states she is back home as of Tuesday and the PCP listed was for while she was at [facility] and that you are still her PCP. Pt states she does not need to be seen she was doing what she was told to do by MEDICARE.

## 2022-06-03 DIAGNOSIS — N1831 Chronic kidney disease, stage 3a: Secondary | ICD-10-CM | POA: Diagnosis not present

## 2022-06-03 DIAGNOSIS — I5033 Acute on chronic diastolic (congestive) heart failure: Secondary | ICD-10-CM | POA: Diagnosis not present

## 2022-06-03 DIAGNOSIS — I130 Hypertensive heart and chronic kidney disease with heart failure and stage 1 through stage 4 chronic kidney disease, or unspecified chronic kidney disease: Secondary | ICD-10-CM | POA: Diagnosis not present

## 2022-06-03 DIAGNOSIS — J449 Chronic obstructive pulmonary disease, unspecified: Secondary | ICD-10-CM | POA: Diagnosis not present

## 2022-06-03 DIAGNOSIS — M1030 Gout due to renal impairment, unspecified site: Secondary | ICD-10-CM | POA: Diagnosis not present

## 2022-06-03 DIAGNOSIS — K559 Vascular disorder of intestine, unspecified: Secondary | ICD-10-CM | POA: Diagnosis not present

## 2022-06-07 DIAGNOSIS — J449 Chronic obstructive pulmonary disease, unspecified: Secondary | ICD-10-CM | POA: Diagnosis not present

## 2022-06-07 DIAGNOSIS — N1831 Chronic kidney disease, stage 3a: Secondary | ICD-10-CM | POA: Diagnosis not present

## 2022-06-07 DIAGNOSIS — I5033 Acute on chronic diastolic (congestive) heart failure: Secondary | ICD-10-CM | POA: Diagnosis not present

## 2022-06-07 DIAGNOSIS — K559 Vascular disorder of intestine, unspecified: Secondary | ICD-10-CM | POA: Diagnosis not present

## 2022-06-07 DIAGNOSIS — I130 Hypertensive heart and chronic kidney disease with heart failure and stage 1 through stage 4 chronic kidney disease, or unspecified chronic kidney disease: Secondary | ICD-10-CM | POA: Diagnosis not present

## 2022-06-07 DIAGNOSIS — M1030 Gout due to renal impairment, unspecified site: Secondary | ICD-10-CM | POA: Diagnosis not present

## 2022-06-09 DIAGNOSIS — I5033 Acute on chronic diastolic (congestive) heart failure: Secondary | ICD-10-CM | POA: Diagnosis not present

## 2022-06-09 DIAGNOSIS — K559 Vascular disorder of intestine, unspecified: Secondary | ICD-10-CM | POA: Diagnosis not present

## 2022-06-09 DIAGNOSIS — N1831 Chronic kidney disease, stage 3a: Secondary | ICD-10-CM | POA: Diagnosis not present

## 2022-06-09 DIAGNOSIS — J449 Chronic obstructive pulmonary disease, unspecified: Secondary | ICD-10-CM | POA: Diagnosis not present

## 2022-06-09 DIAGNOSIS — M1030 Gout due to renal impairment, unspecified site: Secondary | ICD-10-CM | POA: Diagnosis not present

## 2022-06-09 DIAGNOSIS — I130 Hypertensive heart and chronic kidney disease with heart failure and stage 1 through stage 4 chronic kidney disease, or unspecified chronic kidney disease: Secondary | ICD-10-CM | POA: Diagnosis not present

## 2022-06-10 ENCOUNTER — Telehealth: Payer: Self-pay | Admitting: Family Medicine

## 2022-06-10 DIAGNOSIS — M1030 Gout due to renal impairment, unspecified site: Secondary | ICD-10-CM | POA: Diagnosis not present

## 2022-06-10 DIAGNOSIS — K559 Vascular disorder of intestine, unspecified: Secondary | ICD-10-CM | POA: Diagnosis not present

## 2022-06-10 DIAGNOSIS — J449 Chronic obstructive pulmonary disease, unspecified: Secondary | ICD-10-CM | POA: Diagnosis not present

## 2022-06-10 DIAGNOSIS — I130 Hypertensive heart and chronic kidney disease with heart failure and stage 1 through stage 4 chronic kidney disease, or unspecified chronic kidney disease: Secondary | ICD-10-CM | POA: Diagnosis not present

## 2022-06-10 DIAGNOSIS — N1831 Chronic kidney disease, stage 3a: Secondary | ICD-10-CM | POA: Diagnosis not present

## 2022-06-10 DIAGNOSIS — I5033 Acute on chronic diastolic (congestive) heart failure: Secondary | ICD-10-CM | POA: Diagnosis not present

## 2022-06-10 NOTE — Telephone Encounter (Signed)
Called and lm on [place] vm with VO.

## 2022-06-10 NOTE — Telephone Encounter (Signed)
Home Health Verbal Orders  Agency:  [facility]  Caller: Dyann Kief and title  Requesting OT/ PT/ Skilled nursing/ Social Work/ Speech:    Reason for Request:  Verbal order for Home PT  Frequency:    [place] needs F2F w/in last 30 days     [phone]

## 2022-06-14 DIAGNOSIS — I5033 Acute on chronic diastolic (congestive) heart failure: Secondary | ICD-10-CM | POA: Diagnosis not present

## 2022-06-14 DIAGNOSIS — N1831 Chronic kidney disease, stage 3a: Secondary | ICD-10-CM | POA: Diagnosis not present

## 2022-06-14 DIAGNOSIS — K559 Vascular disorder of intestine, unspecified: Secondary | ICD-10-CM | POA: Diagnosis not present

## 2022-06-14 DIAGNOSIS — J449 Chronic obstructive pulmonary disease, unspecified: Secondary | ICD-10-CM | POA: Diagnosis not present

## 2022-06-14 DIAGNOSIS — M1030 Gout due to renal impairment, unspecified site: Secondary | ICD-10-CM | POA: Diagnosis not present

## 2022-06-14 DIAGNOSIS — I130 Hypertensive heart and chronic kidney disease with heart failure and stage 1 through stage 4 chronic kidney disease, or unspecified chronic kidney disease: Secondary | ICD-10-CM | POA: Diagnosis not present

## 2022-06-15 DIAGNOSIS — K559 Vascular disorder of intestine, unspecified: Secondary | ICD-10-CM | POA: Diagnosis not present

## 2022-06-15 DIAGNOSIS — M1030 Gout due to renal impairment, unspecified site: Secondary | ICD-10-CM | POA: Diagnosis not present

## 2022-06-15 DIAGNOSIS — N1831 Chronic kidney disease, stage 3a: Secondary | ICD-10-CM | POA: Diagnosis not present

## 2022-06-15 DIAGNOSIS — I5033 Acute on chronic diastolic (congestive) heart failure: Secondary | ICD-10-CM | POA: Diagnosis not present

## 2022-06-15 DIAGNOSIS — I130 Hypertensive heart and chronic kidney disease with heart failure and stage 1 through stage 4 chronic kidney disease, or unspecified chronic kidney disease: Secondary | ICD-10-CM | POA: Diagnosis not present

## 2022-06-15 DIAGNOSIS — J449 Chronic obstructive pulmonary disease, unspecified: Secondary | ICD-10-CM | POA: Diagnosis not present

## 2022-06-22 ENCOUNTER — Other Ambulatory Visit: Payer: Self-pay | Admitting: Family Medicine

## 2022-06-22 DIAGNOSIS — M1030 Gout due to renal impairment, unspecified site: Secondary | ICD-10-CM | POA: Diagnosis not present

## 2022-06-22 DIAGNOSIS — I130 Hypertensive heart and chronic kidney disease with heart failure and stage 1 through stage 4 chronic kidney disease, or unspecified chronic kidney disease: Secondary | ICD-10-CM | POA: Diagnosis not present

## 2022-06-22 DIAGNOSIS — N1831 Chronic kidney disease, stage 3a: Secondary | ICD-10-CM | POA: Diagnosis not present

## 2022-06-22 DIAGNOSIS — K559 Vascular disorder of intestine, unspecified: Secondary | ICD-10-CM | POA: Diagnosis not present

## 2022-06-22 DIAGNOSIS — I5033 Acute on chronic diastolic (congestive) heart failure: Secondary | ICD-10-CM | POA: Diagnosis not present

## 2022-06-22 DIAGNOSIS — J449 Chronic obstructive pulmonary disease, unspecified: Secondary | ICD-10-CM | POA: Diagnosis not present

## 2022-06-29 ENCOUNTER — Telehealth: Payer: Self-pay | Admitting: Family Medicine

## 2022-06-29 NOTE — Telephone Encounter (Signed)
.  Home Health Certification or Plan of Care Tracking  Is this a Certification or Plan of Care? Yes  [place] Agency: [place] Number:  [id number]  Has charge sheet been attached? Yes  Where has form been placed:  In provider's box   Faxed to:   [phone]

## 2022-06-30 ENCOUNTER — Encounter: Payer: Self-pay | Admitting: Physical Medicine and Rehabilitation

## 2022-06-30 ENCOUNTER — Encounter
Payer: Medicare Other | Attending: Physical Medicine and Rehabilitation | Admitting: Physical Medicine and Rehabilitation

## 2022-06-30 VITALS — BP 130/80 | HR 72 | Ht 59.0 in | Wt 166.0 lb

## 2022-06-30 DIAGNOSIS — M791 Myalgia, unspecified site: Secondary | ICD-10-CM | POA: Diagnosis not present

## 2022-06-30 MED ORDER — LIDOCAINE HCL 1 % IJ SOLN: 4.0000 mL | Freq: Once | INTRAMUSCULAR | Status: DC

## 2022-06-30 NOTE — Progress Notes (Signed)
Trigger Point Injection  Indication: Lumbar myofascial pain not relieved by medication management and other conservative care.  Informed consent was obtained after describing risk and benefits of the procedure with the patient, this includes bleeding, bruising, infection and medication side effects.  The patient wishes to proceed and has given written consent.  The patient was placed in a seated position.  The area of pain was marked and prepped with Betadine.  It was entered with a 25-gauge 1/2 inch needle and a total of 5 mL of 1% lidocaine and normal saline was injected into a total of 4 trigger points, after negative draw back for blood.  The patient tolerated the procedure well.  Post procedure instructions were given.

## 2022-07-01 ENCOUNTER — Other Ambulatory Visit: Payer: Self-pay | Admitting: Family Medicine

## 2022-07-04 NOTE — Telephone Encounter (Signed)
Placed in providers box.

## 2022-07-06 ENCOUNTER — Telehealth: Payer: Self-pay

## 2022-07-06 NOTE — Telephone Encounter (Signed)
Please call pt back @ [phone], around 4pm, she is out on the road. She has no idea what we are talking about with the Home Health order. Please advise.

## 2022-07-06 NOTE — Telephone Encounter (Signed)
Please schedule pt face to face visit in order for home health orders to be signed off on.

## 2022-07-07 ENCOUNTER — Telehealth: Payer: Self-pay | Admitting: Pharmacist

## 2022-07-07 NOTE — Telephone Encounter (Signed)
Unfortunately I cannot sign off on the services without seeing her even if they have already completed them-there is a chance that insurance will end up not covering those services if she does not have the face-to-face-I am not trying to force her to have a visit I just want to be honest about the situation.  If there was another physician that she saw during this timeframe she could ask for those papers to be sent to them and see if they would sign them if they have seen her face-to-face within that timeframe

## 2022-07-07 NOTE — Telephone Encounter (Signed)
Fyi called and spoke with pt and she states they completed this therapy with her and have discharged her about 2 weeks ago so she is no longer needing their services.

## 2022-07-07 NOTE — Progress Notes (Signed)
   Patient called wanting to know if you recommend for her to take RSV vaccine. She also wants to know if there is a new COVID vaccine? Should she consider taking a new COVID vaccine? Should she wait 6 months since her last COVID vaccination?  Her last COVID vaccine 02/18/2022 [place]  April D Calhoun, [place] Pharmacist Assistant [phone]

## 2022-07-08 NOTE — Telephone Encounter (Signed)
Lmtcb.

## 2022-07-11 ENCOUNTER — Telehealth: Payer: Self-pay | Admitting: Pharmacist

## 2022-07-11 NOTE — Progress Notes (Signed)
Care Management & Coordination Services Pharmacy Team  Reason for Encounter: Appointment Reminder  Contacted patient to confirm telephone appointment with Leata Mouse, PharmD on 07/12/2022 at 4 pm. Spoke with patient on 07/11/2022    Star Rating Drugs:  Atorvastatin 40 mg last filled 07/01/2022 90 DS Lisinopril 5 mg last filled 04/27/2022 90 DS   Care Gaps: Annual wellness visit in last year? Yes   Future Appointments  Date Time Provider [place]  07/12/2022  4:00 PM Edythe Clarity, [place] [facility] None  08/11/2022 10:45 AM Raulkar, Clide Deutscher, MD [facility] CPR  08/26/2022 10:00 AM Marin Olp, MD [facility] PEC  02/10/2023  1:00 PM [facility] Cherly Anderson VISIT 1 [facility] PEC   April D Calhoun, [place] Pharmacist Assistant [phone]

## 2022-07-12 ENCOUNTER — Ambulatory Visit: Payer: Medicare Other | Admitting: Pharmacist

## 2022-07-12 NOTE — Progress Notes (Unsigned)
Care Management & Coordination Services Pharmacy Note  07/14/2022 Name:  [name] [name] MRN:  [id number] DOB:  03-Mar-1925  Summary: PharmD FU.  Recent episode of dizziness requiring hospital admission.  Today she reports doing better.  Has not had dizziness - BP is stable.  Counseled on importance of monitoring weight and BP for HF exacerbations.  Recommendations/Changes made from today's visit: No changes to meds  Follow up plan: FU 6 months CMA to call regularly for weights and fluid status   Subjective: [name] [name] is an 89 y.o. year old female who is a primary patient of Yong Channel, Brayton Mars, MD.  The care coordination team was consulted for assistance with disease management and care coordination needs.    Engaged with patient by telephone for follow up visit.  Recent office visits:  None   Recent consult visits:  04/26/2022 OV (Phys Med) Izora Ribas, MD; no medication changes indicated.   Hospital visits:  05/04/2022 ED to Hospital Admission due to orthostatic hypotension -Metoprolol --increased dose as able -resume other meds   Objective:  Lab Results  Component Value Date   CREATININE 0.95 05/08/2022   BUN 13 05/08/2022   GFR 34.71 (L) 01/12/2022   EGFR 36 (L) 02/24/2022   GFRNONAA 55 (L) 05/08/2022   GFRAA 46 (L) 02/19/2020   NA 138 05/08/2022   K 4.0 05/08/2022   CALCIUM 8.8 (L) 05/08/2022   CO2 27 05/08/2022   GLUCOSE 123 (H) 05/08/2022    Lab Results  Component Value Date/Time   HGBA1C 6.0 (H) 05/05/2022 06:52 AM   HGBA1C 6.1 08/20/2020 11:59 AM   GFR 34.71 (L) 01/12/2022 09:47 AM   GFR 40.56 (L) 08/20/2020 11:59 AM    Last diabetic Eye exam:  Lab Results  Component Value Date/Time   HMDIABEYEEXA normal 05/29/2008 12:00 AM    Last diabetic Foot exam: No results found for: "HMDIABFOOTEX"   Lab Results  Component Value Date   CHOL 106 05/06/2022   HDL 46 05/06/2022   LDLCALC 37 05/06/2022   LDLDIRECT 46.0 06/10/2019   TRIG 116  05/06/2022   CHOLHDL 2.3 05/06/2022       Latest Ref Rng & Units 05/04/2022   11:40 AM 02/24/2022    9:47 AM 11/04/2021   10:17 AM  Hepatic Function  Total Protein 6.5 - 8.1 g/dL 6.0  6.5  6.7   Albumin 3.5 - 5.0 g/dL 3.9  4.4  4.5   AST 15 - 41 U/L 16  21  17    ALT 0 - 44 U/L 8  10  10    Alk Phosphatase 38 - 126 U/L 62  85  99   Total Bilirubin 0.3 - 1.2 mg/dL 0.8  0.8  0.4     Lab Results  Component Value Date/Time   TSH 4.860 (H) 11/04/2021 10:17 AM   TSH 3.190 11/27/2019 10:26 AM   FREET4 1.36 11/27/2019 10:26 AM   FREET4 0.67 07/01/2013 02:26 PM       Latest Ref Rng & Units 05/08/2022    2:59 AM 05/06/2022    4:18 AM 05/05/2022    6:52 AM  CBC  WBC 4.0 - 10.5 K/uL 10.3  7.6  7.2   Hemoglobin 12.0 - 15.0 g/dL 12.5  12.6  12.4   Hematocrit 36.0 - 46.0 % 38.6  37.6  35.9   Platelets 150 - 400 K/uL 129  121  120     Lab Results  Component Value Date/Time   VD25OH 49  09/13/2010 11:50 AM   VITAMINB12 444 08/20/2020 11:59 AM   VITAMINB12 339 04/10/2006 08:12 AM    Clinical ASCVD: No  The ASCVD Risk score (Arnett DK, et al., 2019) failed to calculate for the following reasons:   The 2019 ASCVD risk score is only valid for ages 5 to 50        06/30/2022   11:06 AM 04/26/2022   10:59 AM 02/22/2022   11:13 AM  Depression screen PHQ 2/9  Decreased Interest 0 0 0  Down, Depressed, Hopeless 0 0 0  PHQ - 2 Score 0 0 0     Social History   Tobacco Use  Smoking Status Former   Packs/day: 2.00   Years: 30.00   Additional pack years: 0.00   Total pack years: 60.00   Types: Cigarettes   Quit date: 06/18/1993   Years since quitting: 29.0  Smokeless Tobacco Never  Tobacco Comments   QUIT IN 1995   BP Readings from Last 3 Encounters:  06/30/22 130/80  05/10/22 (!) 154/90  04/26/22 133/83   Pulse Readings from Last 3 Encounters:  06/30/22 72  05/10/22 100  04/26/22 86   Wt Readings from Last 3 Encounters:  06/30/22 166 lb (75.3 kg)  05/04/22 166 lb 0.1 oz  (75.3 kg)  04/26/22 166 lb (75.3 kg)   BMI Readings from Last 3 Encounters:  06/30/22 33.53 kg/m  05/04/22 33.53 kg/m  04/26/22 33.53 kg/m    Allergies  Allergen Reactions   Sulfa Antibiotics Other (See Comments)    Cold sweat light headed and disorientation   Tiotropium Bromide Shortness Of Breath and Other (See Comments)    Sore throat also   Latex Itching and Rash   Ultram [Tramadol] Nausea And Vomiting   5-Alpha Reductase Inhibitors     Medications Reviewed Today     Reviewed by Edythe Clarity, [facility] (Pharmacist) on 07/14/22 at 1107  Med List Status: <None>   Medication Order Taking? Sig Documenting Provider Last Dose Status Informant  acetaminophen (TYLENOL) 500 MG tablet [id number] Yes Take 2 tablets (1,000 mg total) by mouth 2 (two) times daily. Geradine Girt, DO Taking Active   apixaban (ELIQUIS) 5 MG TABS tablet [id number] Yes Take 1 tablet by mouth twice daily Skeet Latch, MD Taking Active Self  atorvastatin (LIPITOR) 40 MG tablet [id number] Yes Take 1 tablet by mouth once daily Marin Olp, MD Taking Active   diclofenac Sodium (VOLTAREN) 1 % GEL [id number] Yes Apply 2 g topically 4 (four) times daily. Geradine Girt, DO Taking Active   famotidine (PEPCID) 20 MG tablet [id number] Yes Take 20mg  in the evening for 1 week. Then take as needed for indigestion. Loel Dubonnet, NP Taking Active Self  furosemide (LASIX) 40 MG tablet [id number] Yes Take 1 tablet (40 mg total) by mouth daily as needed for edema. Geradine Girt, DO Taking Active   ipratropium (ATROVENT) 0.06 % nasal spray [id number] Yes USE 2 SPRAY(S) IN EACH NOSTRIL TWICE DAILY Kozlow, Donnamarie Poag, MD Taking Active Self           Med Note Nigel Mormon, KIMBERLY L   Wed May 04, 2022  1:22 PM) prn  lidocaine (LIDODERM) 5 % [id number] Yes Place 1 patch onto the skin daily. Remove & Discard patch within 12 hours or as directed by MD Raulkar, Clide Deutscher, MD Taking Active Self           Med Note Nigel Mormon,  [facility] L   Wed May 04, 2022  1:22 PM) prn  lidocaine (XYLOCAINE) 1 % (with pres) injection 4 mL [id number]   Raulkar, Clide Deutscher, MD  Active   lisinopril (ZESTRIL) 5 MG tablet [id number] Yes Take 1 tablet (5 mg total) by mouth daily. Loel Dubonnet, NP Taking Active Self  meclizine (ANTIVERT) 25 MG tablet [id number] Yes Take 1 tablet (25 mg total) by mouth 3 (three) times daily as needed for dizziness. Geradine Girt, DO Taking Active   melatonin 3 MG TABS tablet [id number] Yes Take 2 tablets (6 mg total) by mouth at bedtime as needed (sleep). Geradine Girt, DO Taking Active   metoprolol succinate (TOPROL-XL) 100 MG 24 hr tablet [id number] Yes Take 1 tablet (100 mg total) by mouth daily. Take with or immediately following a meal. Skeet Latch, MD Taking Active Self  Multiple Vitamin (MULTIVITAMIN WITH MINERALS) TABS tablet [id number] Yes Take 1 tablet by mouth daily. [provider] Taking Active Self  pantoprazole (PROTONIX) 40 MG tablet [id number] Yes TAKE 1 TABLET BY MOUTH TWICE DAILY AS DIRECTED Marin Olp, MD Taking Active   RESTASIS 0.05 % ophthalmic emulsion [id number] Yes Place 1 drop into both eyes 2 (two) times daily as needed (dry eyes).  [provider] Taking Active Self  senna-docusate (SENOKOT-S) 8.6-50 MG tablet [id number] Yes Take 1 tablet by mouth at bedtime as needed for mild constipation. Geradine Girt, DO Taking Active   Med List Note Deneise Lever, MD 08/05/10 2224): Allergy vaccine 1:10 GH            SDOH:  (Social Determinants of Health) assessments and interventions performed: NO, done within year Financial Resource Strain: Low Risk  (02/03/2022)   Overall Financial Resource Strain (CARDIA)    Difficulty of Paying Living Expenses: Not hard at all   Food Insecurity: No Food Insecurity (05/04/2022)   Hunger Vital Sign    Worried About Running Out of Food in the Last Year: Never true    Ran Out of Food in the Last Year: Never true     SDOH Interventions    Flowsheet Row Clinical Support from 02/03/2022 in [place] Interventions Intervention Not Indicated  Housing Interventions Intervention Not Indicated  Transportation Interventions Intervention Not Indicated  Financial Strain Interventions Intervention Not Indicated  Physical Activity Interventions Intervention Not Indicated  Stress Interventions Intervention Not Indicated  Social Connections Interventions Intervention Not Indicated       Medication Assistance: None required.  Patient affirms current coverage meets needs.  Medication Access: Within the past 30 days, how often has patient missed a dose of medication? 0 Is a pillbox or other method used to improve adherence? No  Factors that may affect medication adherence? no barriers identified Are meds synced by current pharmacy? No  Are meds delivered by current pharmacy? No  Does patient experience delays in picking up medications due to transportation concerns? No   [facility] Services Reviewed: Is patient disadvantaged to use [facility]?: No  Current Rx insurance plan: Medicare Name and location of Current pharmacy:  [place], [place] [address] Phone: [phone] Fax: [place] [address] [name] [address] Phone: [phone] Fax: [phone]  [facility] services reviewed with patient today?: Yes  Patient requests to transfer care to [facility]?: No  Reason patient declined to change pharmacies: Loyalty to other  pharmacy/Patient preference  Compliance/Adherence/Medication fill history: Atorvastatin 40 mg last filled 07/01/2022 90 DS Lisinopril 5 mg last filled 04/27/2022 90 DS     Care Gaps: Annual wellness visit in last year?  Yes   Assessment/Plan    Hypertension (BP goal <130/80) -Controlled, not assessed -Current treatment: Lisinopril 5mg  daily Appropriate, Effective, Safe, Accessible Metoprolol XL 100mg  daily Appropriate, Effective, Safe, Accessible -Medications previously tried: Valsartan, HCTZ  -Current home readings: controlled, has not checked it as much -Current dietary habits: eats what taste good to her -Current exercise habits: She stands up on her toes, does kicks from side to side and rides stationary bike 20 -30 minutes a day. -Denies hypotensive/hypertensive symptoms -Educated on BP goals and benefits of medications for prevention of heart attack, stroke and kidney damage; Importance of home blood pressure monitoring; Symptoms of hypotension and importance of maintaining adequate hydration; -Counseled to monitor BP at home a few times per week, document, and provide log at future appointments -No changes needed at this time, continue active lifestyle  Hyperlipidemia: (LDL goal < 70) -Controlled, not assessed -Current treatment: Atorvastatin 40mg  daily Appropriate, Effective, Safe, Accessible -Medications previously tried: none noted  -Current dietary patterns: see HTN -Current exercise habits: see HTN -Educated on Cholesterol goals;  Benefits of statin for ASCVD risk reduction; Importance of limiting foods high in cholesterol; -Recommended to continue current medication Most recent LDL is excellent, continue routine screenings  Atrial Fibrillation (Goal: prevent stroke and major bleeding) 07/14/22 -Current treatment: Rate control: Metoprolol XL 100mg  tablets daily Appropriate, Effective, Safe, Accessible Anticoagulation: Eliquis 5mg  BID Appropriate, Effective, Safe, Accessible -Medications previously tried: Warfarin -Home BP and HR readings: see HF  -Counseled on increased risk of stroke due to Afib and benefits of anticoagulation for stroke prevention; bleeding risk associated  with Eliquis and importance of self-monitoring for signs/symptoms of bleeding; -Recommended to continue current medication Eliquis dose still appropriate continues to tolerate with no concerns.  Recent bout with dizziness.  HR remains controlled.  Continue to monitor for now.  Heart Failure (Goal: manage symptoms and prevent exacerbations) 07/12/22 -Controlled -Last ejection fraction: 60-65%  -HF type: Diastolic -Current treatment: Furosemide 40mg  daily Appropriate, Effective, Safe, Accessible Metoprolol XL 100mg  daily Appropriate, Effective, Safe, Accessible -Medications previously tried: spironolactone (brain fog) -Current home BP/HR readings: normal -Current dietary habits: eats pretty much whatever tastes good to her at the moment -Current exercise habits: see above -Educated on Benefits of medications for managing symptoms and prolonging life Importance of weighing daily; if you gain more than 3 pounds in one day or 5 pounds in one week, contact providers -Doing well since hospital stay.  Discussed importance of weight monitoring to prevent HF exacerbation.  Will have CMA call to check regularly on weights/fluids for preventative purposes. No changes to meds at this time.   Patient Goals/Self-Care Activities Patient will:  - take medications as prescribed as evidenced by patient report and record review check blood pressure periodically, document, and provide at future appointments  Follow Up Plan: The care management team will reach out to the patient again over the next 180 days.           Beverly Milch, PharmD Clinical Pharmacist  [facility] [phone]

## 2022-07-15 ENCOUNTER — Other Ambulatory Visit: Payer: Self-pay | Admitting: Family Medicine

## 2022-07-27 ENCOUNTER — Telehealth: Payer: Self-pay | Admitting: Cardiovascular Disease

## 2022-07-27 ENCOUNTER — Other Ambulatory Visit: Payer: Self-pay | Admitting: Cardiovascular Disease

## 2022-07-27 DIAGNOSIS — I4819 Other persistent atrial fibrillation: Secondary | ICD-10-CM

## 2022-07-27 NOTE — Telephone Encounter (Signed)
Pt would like a callback to discuss her most recent hospital visit and how she's been feeling lately as far as being very tired. Please advise

## 2022-07-27 NOTE — Telephone Encounter (Signed)
Returned call to patient,   Patient states that she had a question and she doesn't really feel good. She states she woke up with vertigo in January and was taken to the hospital via EMS. She went to [facility] when she was discharged and she just went home. She states that last Friday she woke up and she just doesn't feel good. Lightheaded, no energy, and if she gets up and moves her head clears up but she gets so tired. BP and weight good. She states she is retaining a little more fluid than she usually does. She has not been taking her meclizine, she states she forgot about being prescribed that. She states she has been taking it everyday but is not seeing much change in her fluid status.   Reviewed uses for Meclizine and when to take it. She states she was going to look for her meclizine when we got off the phone.    Will route to the provider for further review.

## 2022-07-27 NOTE — Telephone Encounter (Signed)
Please review for refill. Thank you! 

## 2022-07-27 NOTE — Telephone Encounter (Signed)
Pt last saw Dr Oval Linsey 02/14/22, last labs 05/08/22 Creat 0.95, age 89, weight 75.3kg, based on specified criteria pt is on appropriate dosage of Eliquis 5mg  BID for afib.  Will refill rx.

## 2022-07-28 NOTE — Telephone Encounter (Signed)
Returned call to patient. Call disconnected. Recalled patient and finished reviewing recommendations.     "Meclizine can cause drowsiness for some people, so while it helps with the vertigo, it can make you sleepy.  If she is still having vertigo, she might try just 1/2 tablet"

## 2022-07-28 NOTE — Telephone Encounter (Signed)
Meclizine can cause drowsiness for some people, so while it helps with the vertigo, it can make you sleepy.  If she is still having vertigo, she might try just 1/2 tablet

## 2022-08-01 ENCOUNTER — Telehealth: Payer: Self-pay | Admitting: Family Medicine

## 2022-08-01 DIAGNOSIS — R5383 Other fatigue: Secondary | ICD-10-CM

## 2022-08-01 NOTE — Telephone Encounter (Signed)
Can check CBC, CMP, TSH under fatigue unspecified and see if patient or family is requesting any other labs

## 2022-08-01 NOTE — Telephone Encounter (Signed)
See below

## 2022-08-01 NOTE — Telephone Encounter (Signed)
Patient's daughter Aram Beecham requests to be called regarding: states Patient has been having fatigue, possible ongoing Vertigo. States Dr. Durene Cal may want to do labs prior to the appointment scheduled for OV 08/05/22.

## 2022-08-02 NOTE — Addendum Note (Signed)
Addended by: Gwenette Greet on: 08/02/2022 01:05 PM   Modules accepted: Orders

## 2022-08-02 NOTE — Telephone Encounter (Signed)
Called and lm for pt mom tcb to get pt scheduled for lab visit prior to 04/12.

## 2022-08-03 ENCOUNTER — Telehealth: Payer: Self-pay | Admitting: Family Medicine

## 2022-08-03 ENCOUNTER — Other Ambulatory Visit (INDEPENDENT_AMBULATORY_CARE_PROVIDER_SITE_OTHER): Payer: Medicare Other

## 2022-08-03 DIAGNOSIS — R5383 Other fatigue: Secondary | ICD-10-CM

## 2022-08-03 LAB — CBC WITH DIFFERENTIAL/PLATELET
Basophils Absolute: 0.1 10*3/uL (ref 0.0–0.1)
Basophils Relative: 0.7 % (ref 0.0–3.0)
Eosinophils Absolute: 0.2 10*3/uL (ref 0.0–0.7)
Eosinophils Relative: 1.6 % (ref 0.0–5.0)
HCT: 41.1 % (ref 36.0–46.0)
Hemoglobin: 13.9 g/dL (ref 12.0–15.0)
Lymphocytes Relative: 25.4 % (ref 12.0–46.0)
Lymphs Abs: 2.4 10*3/uL (ref 0.7–4.0)
MCHC: 33.7 g/dL (ref 30.0–36.0)
MCV: 85.1 fl (ref 78.0–100.0)
Monocytes Absolute: 0.7 10*3/uL (ref 0.1–1.0)
Monocytes Relative: 7 % (ref 3.0–12.0)
Neutro Abs: 6.2 10*3/uL (ref 1.4–7.7)
Neutrophils Relative %: 65.3 % (ref 43.0–77.0)
Platelets: 173 10*3/uL (ref 150.0–400.0)
RBC: 4.84 Mil/uL (ref 3.87–5.11)
RDW: 14.8 % (ref 11.5–15.5)
WBC: 9.4 10*3/uL (ref 4.0–10.5)

## 2022-08-03 LAB — TSH: TSH: 4.2 u[IU]/mL (ref 0.35–5.50)

## 2022-08-03 LAB — COMPREHENSIVE METABOLIC PANEL
ALT: 9 U/L (ref 0–35)
AST: 15 U/L (ref 0–37)
Albumin: 4.5 g/dL (ref 3.5–5.2)
Alkaline Phosphatase: 78 U/L (ref 39–117)
BUN: 31 mg/dL — ABNORMAL HIGH (ref 6–23)
CO2: 33 mEq/L — ABNORMAL HIGH (ref 19–32)
Calcium: 9.7 mg/dL (ref 8.4–10.5)
Chloride: 98 mEq/L (ref 96–112)
Creatinine, Ser: 1.34 mg/dL — ABNORMAL HIGH (ref 0.40–1.20)
GFR: 33.65 mL/min — ABNORMAL LOW (ref >60.00)
Glucose, Bld: 117 mg/dL — ABNORMAL HIGH (ref 70–99)
Potassium: 4.1 mEq/L (ref 3.5–5.1)
Sodium: 141 mEq/L (ref 135–145)
Total Bilirubin: 0.7 mg/dL (ref 0.2–1.2)
Total Protein: 6.6 g/dL (ref 6.0–8.3)

## 2022-08-03 NOTE — Telephone Encounter (Signed)
Home Health Certification or Plan of Care Tracking  Is this a Certification or Plan of Care? Yes   HH Agency:[facility]   Order Number:  [id number]  Has charge sheet been attached?yes   Where has form been placed:  providers folder   Faxed to:   [phone]

## 2022-08-04 ENCOUNTER — Telehealth: Payer: Self-pay | Admitting: Pharmacist

## 2022-08-04 NOTE — Progress Notes (Signed)
Care Management & Coordination Services Pharmacy Team  Reason for Encounter: CHF update   Contacted patient for general health update and medication adherence call.  Spoke with patient on 08/04/2022   Are you having any of the following symptoms:  Shortness of breath with activity or when lying down? Yes, with activity.  Fatigue and weakness? Yes, very tired and weak.  Swelling in the legs, ankles and feet? Yes. Patient states she has had swelling since August 2023.  Rapid or irregular heartbeat? No  Reduced ability to exercise? Yes  Wheezing? No.  A cough that doesn't go away or a cough that brings up white or pink mucus with spots of blood? Patient states she has a cough with some mucus. She denies seeing any blood.  Swelling of the belly area? Yes -patient states this is concerning for her.  Very rapid weight gain from fluid buildup? Weight fluctuates 3-4 lbs  Nausea and lack of appetite? Yes to lack of appetite.  Difficulty concentrating or decreased alertness? No  Patient states she is really concerned about how weak she has been recently.  Patient states she has been taking Meclizine 1/2 tablet three times a day and that is helping her dizziness/vertigo. Patient states she is running out of said medication and needs a refills. Will Dr. Durene Cal refill this medication for her?   Chart Updates:  Recent office visits:  None since last PharmD visit  Recent consult visits:  None since last PharmD visit  Hospital visits:  None since last PharmD visit  Medications: Outpatient Encounter Medications as of 08/04/2022  Medication Sig Note   acetaminophen (TYLENOL) 500 MG tablet Take 2 tablets (1,000 mg total) by mouth 2 (two) times daily.    apixaban (ELIQUIS) 5 MG TABS tablet Take 1 tablet by mouth twice daily    atorvastatin (LIPITOR) 40 MG tablet Take 1 tablet by mouth once daily    diclofenac Sodium (VOLTAREN) 1 % GEL Apply 2 g topically 4 (four) times daily.     famotidine (PEPCID) 20 MG tablet Take 20mg  in the evening for 1 week. Then take as needed for indigestion.    furosemide (LASIX) 40 MG tablet Take 1 tablet (40 mg total) by mouth daily as needed for edema.    ipratropium (ATROVENT) 0.06 % nasal spray USE 2 SPRAY(S) IN EACH NOSTRIL TWICE DAILY 05/04/2022: prn   lidocaine (LIDODERM) 5 % Place 1 patch onto the skin daily. Remove & Discard patch within 12 hours or as directed by MD 05/04/2022: prn   lisinopril (ZESTRIL) 5 MG tablet Take 1 tablet (5 mg total) by mouth daily.    meclizine (ANTIVERT) 25 MG tablet Take 1 tablet (25 mg total) by mouth 3 (three) times daily as needed for dizziness.    melatonin 3 MG TABS tablet Take 2 tablets (6 mg total) by mouth at bedtime as needed (sleep).    metoprolol succinate (TOPROL-XL) 100 MG 24 hr tablet Take 1 tablet (100 mg total) by mouth daily. Take with or immediately following a meal.    Multiple Vitamin (MULTIVITAMIN WITH MINERALS) TABS tablet Take 1 tablet by mouth daily.    pantoprazole (PROTONIX) 40 MG tablet TAKE 1 TABLET BY MOUTH TWICE DAILY AS DIRECTED    RESTASIS 0.05 % ophthalmic emulsion Place 1 drop into both eyes 2 (two) times daily as needed (dry eyes).     senna-docusate (SENOKOT-S) 8.6-50 MG tablet Take 1 tablet by mouth at bedtime as needed for mild constipation.    Facility-Administered  Encounter Medications as of 08/04/2022  Medication   lidocaine (XYLOCAINE) 1 % (with pres) injection 4 mL    Recent vitals BP Readings from Last 3 Encounters:  06/30/22 130/80  05/10/22 (!) 154/90  04/26/22 133/83   Pulse Readings from Last 3 Encounters:  06/30/22 72  05/10/22 100  04/26/22 86   Wt Readings from Last 3 Encounters:  06/30/22 166 lb (75.3 kg)  05/04/22 166 lb 0.1 oz (75.3 kg)  04/26/22 166 lb (75.3 kg)   BMI Readings from Last 3 Encounters:  06/30/22 33.53 kg/m  05/04/22 33.53 kg/m  04/26/22 33.53 kg/m    Recent lab results    Component Value Date/Time   NA 141  08/03/2022 1419   NA 140 02/24/2022 0947   K 4.1 08/03/2022 1419   CL 98 08/03/2022 1419   CO2 33 (H) 08/03/2022 1419   GLUCOSE 117 (H) 08/03/2022 1419   BUN 31 (H) 08/03/2022 1419   BUN 36 02/24/2022 0947   CREATININE 1.34 (H) 08/03/2022 1419   CREATININE 1.19 (H) 02/19/2020 1347   CALCIUM 9.7 08/03/2022 1419    Lab Results  Component Value Date   CREATININE 1.34 (H) 08/03/2022   GFR 33.65 (L) 08/03/2022   EGFR 36 (L) 02/24/2022   GFRNONAA 55 (L) 05/08/2022   GFRAA 46 (L) 02/19/2020   Lab Results  Component Value Date/Time   HGBA1C 6.0 (H) 05/05/2022 06:52 AM   HGBA1C 6.1 08/20/2020 11:59 AM    Lab Results  Component Value Date   CHOL 106 05/06/2022   HDL 46 05/06/2022   LDLCALC 37 05/06/2022   LDLDIRECT 46.0 06/10/2019   TRIG 116 05/06/2022   CHOLHDL 2.3 05/06/2022    Care Gaps: Annual wellness visit in last year? Yes  Star Rating Drugs:  Atorvastatin 40 mg last filled 07/01/2022 90 DS Lisinopril 5 mg last filled 07/24/2022 90 DS   Future Appointments  Date Time Provider Department Center  08/05/2022  3:00 PM Shelva Majestic, MD [facility]  08/11/2022 10:45 AM Raulkar, Drema Pry, MD [facility]  08/26/2022 10:00 AM Shelva Majestic, MD [facility]  09/16/2022  3:10 PM Alver Sorrow, NP [facility]  01/16/2023  2:45 PM Erroll Luna, RPH [facility] None  02/10/2023  1:00 PM [facility] ANNUAL WELLNESS VISIT 1 [facility]   April D Calhoun, [facility] Clinical Pharmacist Assistant [phone]

## 2022-08-05 ENCOUNTER — Encounter: Payer: Self-pay | Admitting: Family Medicine

## 2022-08-05 ENCOUNTER — Ambulatory Visit (INDEPENDENT_AMBULATORY_CARE_PROVIDER_SITE_OTHER): Payer: Medicare Other | Admitting: Family Medicine

## 2022-08-05 VITALS — BP 112/60 | HR 94 | Temp 97.8°F | Ht 59.0 in | Wt 165.8 lb

## 2022-08-05 DIAGNOSIS — E7800 Pure hypercholesterolemia, unspecified: Secondary | ICD-10-CM

## 2022-08-05 DIAGNOSIS — I10 Essential (primary) hypertension: Secondary | ICD-10-CM | POA: Diagnosis not present

## 2022-08-05 DIAGNOSIS — R42 Dizziness and giddiness: Secondary | ICD-10-CM | POA: Diagnosis not present

## 2022-08-05 DIAGNOSIS — I5032 Chronic diastolic (congestive) heart failure: Secondary | ICD-10-CM | POA: Diagnosis not present

## 2022-08-05 DIAGNOSIS — N183 Chronic kidney disease, stage 3 unspecified: Secondary | ICD-10-CM | POA: Diagnosis not present

## 2022-08-05 MED ORDER — MECLIZINE HCL 25 MG PO TABS: 12.5000 mg | ORAL_TABLET | Freq: Three times a day (TID) | ORAL | 0 refills | Status: DC | PRN

## 2022-08-05 NOTE — Assessment & Plan Note (Signed)
S:she states symptoms started back in January before hospitalization. Even sitting forward in chair might get sensation of entire head still moving when she stops and sensation that head is too heavy. Turning head quickly can cause it and loses focus. Can even happen with turning over bed- has based on be very careful with head movement  May have better days and then worse days- hard to make plans as a result  Hospitalized in January and had reassuring MRI, orthostatic vital signs negative- was given meclizine and vestibular rehab in the hospital.   Reports she has fatigue all the time with or without meclizine. Everyday feels fatigued. Swelling not particularly worse and weight fluctuates but no major swings.   ROS- No facial or extremity weakness. No slurred words or trouble swallowing. no blurry vision or double vision. No new paresthesias. No progressive confusion or word finding difficulties.   A/P: This certainly sounds like BPPV with recurrent vertigo episodes with head movement and visible nystagmus with head movement as well as prior response to vestibular rehab-we cuttle is reasonable to retrial this.  She is also had some fatigue with this but no reported chest pain or shortness of breath-Labs were reassuring in recent days including TSH, CBC, CMP on 08/03/2022 other than increased BUN to creatinine ratio but she is using the Lasix daily -Strongly doubt stroke and therefore we did not repeat MRI imaging completed May 04, 2022 which was reassuring

## 2022-08-05 NOTE — Telephone Encounter (Signed)
Form has been given to provider to review with pt at ov today.

## 2022-08-05 NOTE — Patient Instructions (Addendum)
If you do not hear by Tuesday or Wednesday of next week call us back to get update on vestibular rehab referral.   Please let us know if new or worsening symptoms but I am cautiously optimistic this will help  KEEP using walker- avoiding falls one of best things you can do for your health

## 2022-08-05 NOTE — Progress Notes (Signed)
Phone [phone] In person visit   Subjective:   [name] [name] is a 89 y.o. year old very pleasant female patient who presents for/with See problem oriented charting Chief Complaint  Patient presents with   Fatigue    Pt c/o on going vertigo and fatigue.   review labs    Pt had labs drawn yesterday and would like to discuss results in person.   Past Medical History-  Patient Active Problem List   Diagnosis Date Noted   Chronic diastolic heart failure 12/30/2020    Priority: High   Status post femoral-popliteal bypass surgery 12/09/2019    Priority: High   Persistent atrial fibrillation 12/03/2019    Priority: High   Pain in right ankle and joints of right foot 08/28/2014    Priority: High   Peripheral arterial disease 06/29/2007    Priority: High   Vertigo 05/04/2022    Priority: Medium    Left groin hernia 05/22/2020    Priority: Medium    CKD (chronic kidney disease), stage III 08/07/2017    Priority: Medium    Acute gout of left foot 03/22/2016    Priority: Medium    Trigeminal neuralgia     Priority: Medium    Chronic low back pain 09/13/2010    Priority: Medium    COPD mixed type 01/22/2010    Priority: Medium    Insulin resistance 01/06/2009    Priority: Medium    Hyperlipidemia 10/23/2006    Priority: Medium    Essential hypertension 10/23/2006    Priority: Medium    ESOPHAGEAL STRICTURE 08/09/1993    Priority: Medium    Secondary hypercoagulable state 12/03/2019    Priority: Low   Syncope 12/29/2011    Priority: Low   Allergic rhinitis due to pollen 06/25/2010    Priority: Low   Solitary pulmonary nodule 01/12/2010    Priority: Low   Osteopenia 01/06/2009    Priority: Low   Eczema 11/26/2007    Priority: Low   Chronic bilateral low back pain with right-sided sciatica 08/29/2019    Priority: 1.   Unilateral primary osteoarthritis, right knee 04/07/2016    Priority: 1.   Mild sleep apnea 05/03/2021   Anxiety state    Rectal bleeding     Ischemic colitis    Gross hematuria 06/01/2018    Medications- reviewed and updated Current Outpatient Medications  Medication Sig Dispense Refill   acetaminophen (TYLENOL) 500 MG tablet Take 2 tablets (1,000 mg total) by mouth 2 (two) times daily. 30 tablet 0   apixaban (ELIQUIS) 5 MG TABS tablet Take 1 tablet by mouth twice daily 180 tablet 1   atorvastatin (LIPITOR) 40 MG tablet Take 1 tablet by mouth once daily 90 tablet 0   diclofenac Sodium (VOLTAREN) 1 % GEL Apply 2 g topically 4 (four) times daily.     famotidine (PEPCID) 20 MG tablet Take  in the evening for 1 week. Then take as needed for indigestion. 30 tablet 2   furosemide (LASIX) 40 MG tablet Take 1 tablet (40 mg total) by mouth daily as needed for edema. 30 tablet 11   ipratropium (ATROVENT) 0.06 % nasal spray USE 2 SPRAY(S) IN EACH NOSTRIL TWICE DAILY 15 mL 0   lidocaine (LIDODERM) 5 % Place 1 patch onto the skin daily. Remove & Discard patch within 12 hours or as directed by MD 90 patch 3   lisinopril (ZESTRIL) 5 MG tablet Take 1 tablet (5 mg total) by mouth daily. 90 tablet 3  melatonin 3 MG TABS tablet Take 2 tablets (6 mg total) by mouth at bedtime as needed (sleep).  0   metoprolol succinate (TOPROL-XL) 100 MG 24 hr tablet Take 1 tablet (100 mg total) by mouth daily. Take with or immediately following a meal. 90 tablet 3   Multiple Vitamin (MULTIVITAMIN WITH MINERALS) TABS tablet Take 1 tablet by mouth daily.     pantoprazole (PROTONIX) 40 MG tablet TAKE 1 TABLET BY MOUTH TWICE DAILY AS DIRECTED 60 tablet 0   RESTASIS 0.05 % ophthalmic emulsion Place 1 drop into both eyes 2 (two) times daily as needed (dry eyes).   99   senna-docusate (SENOKOT-S) 8.6-50 MG tablet Take 1 tablet by mouth at bedtime as needed for mild constipation.     meclizine (ANTIVERT) 25 MG tablet Take 0.5 tablets (12.5 mg total) by mouth 3 (three) times daily as needed (vertigo). 30 tablet 0   No current facility-administered medications for this  visit.     Objective:  BP 112/60   Pulse 94   Temp 97.8 F (36.6 C)   Ht 4\' 11"  (1.499 m)   Wt 165 lb 12.8 oz (75.2 kg)   SpO2 97%   BMI 33.49 kg/m  Gen: NAD, resting comfortably CV: RRR no murmurs rubs or gallops Lungs: CTAB no crackles, wheeze, rhonchi Ext: trace edema Skin: warm, dry Neuro: Patient reports of vertigo with head movement-able to note nystagmus at times with moving her head    Assessment and Plan   # Vertigo S:she states symptoms started back in January before hospitalization. Even sitting forward in chair might get sensation of entire head still moving when she stops and sensation that head is too heavy. Turning head quickly can cause it and loses focus. Can even happen with turning over bed- has based on be very careful with head movement  May have better days and then worse days- hard to make plans as a result  Hospitalized in January and had reassuring MRI, orthostatic vital signs negative- was given meclizine and vestibular rehab in the hospital.   Reports she has fatigue all the time with or without meclizine. Everyday feels fatigued. Swelling not particularly worse and weight fluctuates but no major swings.   ROS- No facial or extremity weakness. No slurred words or trouble swallowing. no blurry vision or double vision. No new paresthesias. No progressive confusion or word finding difficulties.   A/P: This certainly sounds like BPPV with recurrent vertigo episodes with head movement and visible nystagmus with head movement as well as prior response to vestibular rehab-we thought it is reasonable to retrial this.  She is also had some fatigue with this but no reported chest pain or shortness of breath-Labs were reassuring in recent days including TSH, CBC, CMP on 08/03/2022 other than increased BUN to creatinine ratio but she is using the Lasix daily -Strongly doubt stroke and therefore we did not repeat MRI imaging completed May 04, 2022 which was  reassuring -Offered EKG but no palpitations, chest pain, shortness of breath-she opted to hold off until next visit unless symptoms fail to improve with therapy   # Atrial fibrillation S: Rate controlled with metoprolol 100 mg extended release Anticoagulated with Eliquis 5 mg twice daily- reevaluate dose if cr raises above 1.5-but at 1.3 on last check A/P: appropriately anticoagulated and rate controlled- continue current medicine   #Diastolic CHF- sees dr. Duke Salvia #Essential hypertension S: medication: Lisinopril 5 mg, metoprolol 100 mg XR and lasix 40 mg daily prn -prior  on irbesartan 30 mg and hydrochlorothiazide 25 mg but stopped after august 2021 hospitalization.   Edema: None on daily Lasix  Weight gain: Weight within 1 pound of last office visit   BP Readings from Last 3 Encounters:  08/05/22 112/60  06/30/22 130/80  05/10/22 (!) 154/90    A/P: Diastolic CHF appears euvolemic-continue current medication Hypertension-controlled. Continue current medications.  #Chronic kidney disease stage III S: Patient's GFR has been stable in the 30s in 2023-seemed to improve in early 2024 but on most recent check back at 33.  She knows to avoid NSAIDs A/P: Stable problem continue to monitor without medication  Recommended follow up: Return for next already scheduled visit or sooner if needed. Future Appointments  Date Time Provider Department Center  08/11/2022 10:45 AM Raulkar, Drema Pry, MD [facility]  08/26/2022 10:00 AM Shelva Majestic, MD [facility]  09/16/2022  3:10 PM Alver Sorrow, NP [facility]  01/16/2023  2:45 PM Willa Frater L, RPH [facility] None  02/10/2023  1:00 PM [facility] ANNUAL WELLNESS VISIT 1 [facility]    Lab/Order associations:   ICD-10-CM   1. Vertigo  R42 Ambulatory referral to Home Health    CANCELED: Ambulatory referral to Physical Therapy    CANCELED: Ambulatory referral to Home Health    2. Chronic diastolic heart failure  I50.32     3. Stage  3 chronic kidney disease, unspecified whether stage 3a or 3b CKD  N18.30     4. Essential hypertension  I10     5. Pure hypercholesterolemia  E78.00       Meds ordered this encounter  Medications   meclizine (ANTIVERT) 25 MG tablet    Sig: Take 0.5 tablets (12.5 mg total) by mouth 3 (three) times daily as needed (vertigo).    Dispense:  30 tablet    Refill:  0    Return precautions advised.  Tana Conch, MD

## 2022-08-11 ENCOUNTER — Encounter
Payer: Medicare Other | Attending: Physical Medicine and Rehabilitation | Admitting: Physical Medicine and Rehabilitation

## 2022-08-11 ENCOUNTER — Encounter: Payer: Self-pay | Admitting: Physical Medicine and Rehabilitation

## 2022-08-11 VITALS — BP 130/77 | HR 86 | Ht 59.0 in | Wt 166.6 lb

## 2022-08-11 DIAGNOSIS — M5441 Lumbago with sciatica, right side: Secondary | ICD-10-CM

## 2022-08-11 DIAGNOSIS — G8929 Other chronic pain: Secondary | ICD-10-CM | POA: Diagnosis not present

## 2022-08-11 MED ORDER — CAPSAICIN-CLEANSING GEL 8 % EX KIT
1.0000 | PACK | Freq: Once | CUTANEOUS | Status: AC
  Administered 2022-08-11: 1 via TOPICAL

## 2022-08-11 NOTE — Progress Notes (Signed)
Subjective:    Patient ID: [name] [name], female    DOB: [birth year], 89 y.o.   MRN: [id number]  HPI  Female with pmh/psh of PAD, COPD, DVT, HTN, Gout, trigeminal neuralgia, post herpetic neuralgia, OA of right knee with injections, lumbar fusion present for follow up of pain in her back > leg pain.   Initially stated: Pt stated she has had back pain "all her life".  Getting progressively worse.  Movement improves the pain.  Standing and walking exacerbate the pain. Dull pain.  Non-radiating.  Intermittent.  Denies associated numbness, tingling, weakness.  Lidoderm patch help. Pain limits pt from doing things around the house.   She has been using tylenol, TENS, and lidocaine patches. She does not like heating pads as does not want to be tied down. Se has not tried any other medicines for  Since that time, pt states her back is hurting. She is doing HEP. She notes drowsiness with Baclofen. Denies falls. Rocking back and forth can help. Pain does radiate down her legs.   She feels the pain worst in the morning- then she can hardly get out of bed because the pain is severe.   She is ready to try Qutenza patch today  Pain Inventory Average Pain 6 Pain Right Now 6 My pain is constant, sharp and stabbing  In the last 24 hours, has pain interfered with the following? General activity 6 Relation with others 3 Enjoyment of life 7 What TIME of day is your pain at its worst? morning  and night Sleep (in general) Poor  Pain is worse with: walking, standing, and some activites Pain improves with: rest and therapy/exercise Relief from Meds: 6  Family History  Problem Relation Age of Onset   Arthritis Mother    Hypertension Mother    Heart disease Father    Colon polyps Father    Breast cancer Sister    Breast cancer Daughter    Hypertension Son    Lung cancer Brother    Colon cancer Sister    Brain cancer Sister    Lung cancer Sister    Anesthesia problems Neg Hx    Hypotension  Neg Hx    Malignant hyperthermia Neg Hx    Pseudochol deficiency Neg Hx    Social History   Socioeconomic History   Marital status: Widowed    Spouse name: Not on file   Number of children: 4   Years of education: 76   Highest education level: Not on file  Occupational History   Occupation: retired    Associate Professor: RETIRED  Tobacco Use   Smoking status: Former    Packs/day: 2.00    Years: 30.00    Additional pack years: 0.00    Total pack years: 60.00    Types: Cigarettes    Quit date: 06/18/1993    Years since quitting: 29.1   Smokeless tobacco: Never   Tobacco comments:    QUIT IN 1995  Vaping Use   Vaping Use: Never used  Substance and Sexual Activity   Alcohol use: No    Alcohol/week: 0.0 standard drinks of alcohol   Drug use: No   Sexual activity: Not on file  Other Topics Concern   Not on file  Social History Narrative   Patient is widowed with 4 children. 2 grandkids. Lives alone.    Patient is right handed.   Patient has high school education.   Patient drinks 1 cup daily.  Retired from [employer] for 28 years, works 2 days a week until 2016.       Hobbies: volunteers at [employer], [place] from church visits people.       No caffeine.    Social Determinants of Health   Financial Resource Strain: Low Risk  (02/03/2022)   Overall Financial Resource Strain (CARDIA)    Difficulty of Paying Living Expenses: Not hard at all  Food Insecurity: No Food Insecurity (05/04/2022)   Hunger Vital Sign    Worried About Running Out of Food in the Last Year: Never true    Ran Out of Food in the Last Year: Never true  Transportation Needs: No Transportation Needs (05/04/2022)   PRAPARE - Administrator, Civil Service (Medical): No    Lack of Transportation (Non-Medical): No  Physical Activity: Sufficiently Active (02/03/2022)   Exercise Vital Sign    Days of Exercise per Week: 7 days    Minutes of Exercise per Session: 30 min  Stress: No  Stress Concern Present (02/03/2022)   [employer] of Occupational Health - Occupational Stress Questionnaire    Feeling of Stress : Not at all  Social Connections: Moderately Integrated (02/03/2022)   Social Connection and Isolation Panel [NHANES]    Frequency of Communication with Friends and Family: More than three times a week    Frequency of Social Gatherings with Friends and Family: More than three times a week    Attends Religious Services: More than 4 times per year    Active Member of [employer] or Organizations: Yes    Attends Banker Meetings: 1 to 4 times per year    Marital Status: Widowed   Past Surgical History:  Procedure Laterality Date   ABDOMINAL AORTAGRAM N/A 06/17/2011   Procedure: ABDOMINAL Ronny Flurry;  Surgeon: Sherren Kerns, MD;  Location: [facility] CATH LAB;  Service: Cardiovascular;  Laterality: N/A;   ABDOMINAL AORTOGRAM W/LOWER EXTREMITY Bilateral 06/22/2018   Procedure: ABDOMINAL AORTOGRAM W/LOWER EXTREMITY;  Surgeon: Sherren Kerns, MD;  Location: [facility] INVASIVE CV LAB;  Service: Cardiovascular;  Laterality: Bilateral;   ABDOMINAL AORTOGRAM W/LOWER EXTREMITY Bilateral 11/29/2019   Procedure: ABDOMINAL AORTOGRAM W/LOWER EXTREMITY;  Surgeon: Sherren Kerns, MD;  Location: [facility] INVASIVE CV LAB;  Service: Cardiovascular;  Laterality: Bilateral;   CATARACT EXTRACTION, BILATERAL     w IOL   CHOLECYSTECTOMY  1998   COLONOSCOPY     ENDARTERECTOMY FEMORAL Left 12/09/2019   Procedure: ENDARTERECTOMY FEMORAL with bovine patch angioplasty.;  Surgeon: Sherren Kerns, MD;  Location: [facility] OR;  Service: Vascular;  Laterality: Left;   FEMORAL-POPLITEAL BYPASS GRAFT        x2 surgeries 1990's & 2009   FEMORAL-POPLITEAL BYPASS GRAFT Right 12/09/2019   Procedure: REDO RIGHT FEMORAL-POPLITEAL ARTERY BYPASS GRAFT;  Surgeon: Sherren Kerns, MD;  Location: [facility] OR;  Service: Vascular;  Laterality: Right;   INJECTION KNEE Right Aug. 2016   Gel injection for pain   LUMBAR  FUSION  07/06/2011   TONSILLECTOMY     as a teenager    TUBAL LIGATION     Past Surgical History:  Procedure Laterality Date   ABDOMINAL AORTAGRAM N/A 06/17/2011   Procedure: ABDOMINAL Ronny Flurry;  Surgeon: Sherren Kerns, MD;  Location: [facility] CATH LAB;  Service: Cardiovascular;  Laterality: N/A;   ABDOMINAL AORTOGRAM W/LOWER EXTREMITY Bilateral 06/22/2018   Procedure: ABDOMINAL AORTOGRAM W/LOWER EXTREMITY;  Surgeon: Sherren Kerns, MD;  Location: [facility] INVASIVE CV LAB;  Service: Cardiovascular;  Laterality: Bilateral;   ABDOMINAL AORTOGRAM W/LOWER EXTREMITY Bilateral 11/29/2019   Procedure: ABDOMINAL AORTOGRAM W/LOWER EXTREMITY;  Surgeon: Sherren Kerns, MD;  Location: [facility] INVASIVE CV LAB;  Service: Cardiovascular;  Laterality: Bilateral;   CATARACT EXTRACTION, BILATERAL     w IOL   CHOLECYSTECTOMY  1998   COLONOSCOPY     ENDARTERECTOMY FEMORAL Left 12/09/2019   Procedure: ENDARTERECTOMY FEMORAL with bovine patch angioplasty.;  Surgeon: Sherren Kerns, MD;  Location: [facility] OR;  Service: Vascular;  Laterality: Left;   FEMORAL-POPLITEAL BYPASS GRAFT        x2 surgeries 1990's & 2009   FEMORAL-POPLITEAL BYPASS GRAFT Right 12/09/2019   Procedure: REDO RIGHT FEMORAL-POPLITEAL ARTERY BYPASS GRAFT;  Surgeon: Sherren Kerns, MD;  Location: [facility] OR;  Service: Vascular;  Laterality: Right;   INJECTION KNEE Right Aug. 2016   Gel injection for pain   LUMBAR FUSION  07/06/2011   TONSILLECTOMY     as a teenager    TUBAL LIGATION     Past Medical History:  Diagnosis Date   Acute on chronic diastolic heart failure 12/30/2020   Allergic rhinoconjunctivitis    Allergy    Anxiety    pt. managed- uses deep breathing    Arthritis    low back , stenosis   COLONIC POLYPS, RECURRENT 08/29/2006   2008 last colonoscopy. No further colonoscopy.      COPD (chronic obstructive pulmonary disease)    Diverticulosis    Dysrhythmia    afib   Eczema    Environmental allergies    allergy shot- q friday in Dr.  Roxy Cedar office. PFT's abnormal- recommended Spiriva to use preop & will d/c after surgery   GERD (gastroesophageal reflux disease)    Hiatal hernia    Hyperlipidemia    Hypertension    Lung nodule 2011   Paroxysmal atrial fibrillation 11/27/2019   Peripheral vascular disease    Shingles    Stenosis of popliteal artery    blood clots in legs long ago     Trigeminal neuralgia    Left buttocks   BP 130/77   Pulse 86   Ht 4\' 11"  (1.499 m)   Wt 166 lb 9.6 oz (75.6 kg)   SpO2 93%   BMI 33.65 kg/m   Opioid Risk Score:   Fall Risk Score:  `1  Depression screen [facility] 2/9     06/30/2022   11:06 AM 04/26/2022   10:59 AM 02/22/2022   11:13 AM 02/03/2022    1:03 PM 12/28/2021   11:03 AM 11/02/2021   10:17 AM 09/07/2021   11:16 AM  Depression screen [facility] 2/9  Decreased Interest 0 0 0 0 0 0 0  Down, Depressed, Hopeless 0 0 0 0 0 0 0  PHQ - 2 Score 0 0 0 0 0 0 0   Review of Systems  Constitutional: Negative.   HENT: Negative.    Eyes: Negative.   Respiratory: Negative.    Cardiovascular: Negative.   Gastrointestinal: Negative.   Endocrine: Negative.   Genitourinary: Negative.   Musculoskeletal:  Positive for back pain and gait problem.  Skin: Negative.   Allergic/Immunologic: Negative.   Hematological:  Bruises/bleeds easily.       Eliquis  Psychiatric/Behavioral: Negative.    All other systems reviewed and are negative.     Objective:   Physical Exam  Performed via phone.     Assessment & Plan:  Female with pmhpsh of PAD, COPD, DVT, HTN, Gout, trigeminal neuralgia, post herpetic neuralgia,  OA of right knee with injections, lumbar fusion present for follow up of pain in her back > leg pain.     1. Chronic low back pain with failed back syndrome              Xray of L-spine and right SI joint reviewed, SI joint unremarkable, degenerative changes in spine with right scoliosis.  -Discussed Qutenza as an option for neuropathic pain control. Discussed that this is a capsaicin patch,  stronger than capsaicin cream. Discussed that it is currently approved for diabetic peripheral neuropathy and post-herpetic neuralgia, but that it has also shown benefit in treating other forms of neuropathy. Provided patient with link to site to learn more about the patch: https://www.clark.biz/. Discussed that the patch would be placed in office and benefits usually last 3 months. Discussed that unintended exposure to capsaicin can cause severe irritation of eyes, mucous membranes, respiratory tract, and skin, but that Qutenza is a local treatment and does not have the systemic side effects of other nerve medications. Discussed that there may be pain, itching, erythema, and decreased sensory function associated with the application of Qutenza. Side effects usually subside within 1 week. A cold pack of analgesic medications can help with these side effects. Blood pressure can also be increased due to pain associated with administration of the patch.  1 patch of Qutenza was applied to the area of pain. Ice packs were applied during the procedure to ensure patient comfort. Blood pressure was monitored every 15 minutes. The patient tolerated the procedure well. Post-procedure instructions were given and follow-up has been scheduled.                Will avoid NSAIDs due to coumadin             Stopped using Robaxin due to nausea             Unable to tolerate Gabapentin             Cont heat  Repeat MRI lumbar spine, last 2012, to help see if facet athropathy.              Cont tylenol, limit to 2000mg /day             Continue Lidoderm patches  Will refer to PT now that patient has transportation             Resume TENS unit PRN             Continue  Cymbalta to 60 mg  Baclofen changed to 5 qhs  -Lumbar transforaminal ESI is indicated for the following levels: The patient has low back pain; lower extremity pain; paresthesias; increased pain with coughing, sneezing, and/or straining; lower extremity  weakness; and gait disturbance. The patient has no bowel or bladder dysfunction or saddle anesthesia. The patient has failed months of conservative care with physical therapy and HEP, medications, activity and lifestyle modification, and TENS. MRI was performed and shows stenosis throughout her spine- worse at the lumbar and thoracic levels. The patient has never had ESI before.    -Discussed that Eliquis would have to be stopped for 3 days before procedure so she would like to clear with Dr. Duke Salvia before.   -Discussed Meloxicam- she will clear with Dr Duke Salvia   2. Possible Sacroiliitis bilaterally             Xray unremakrable             Minimal benefit with bracing  Discussed b/l SI joint injections -Discussed benefits of exercise in reducing pain. -Discussed following foods that may reduce pain: 1) Ginger (especially studied for arthritis)- reduce leukotriene production to decrease inflammation 2) Blueberries- high in phytonutrients that decrease inflammation 3) Salmon- marine omega-3s reduce joint swelling and pain 4) Pumpkin seeds- reduce inflammation 5) dark chocolate- reduces inflammation 6) turmeric- reduces inflammation 7) tart cherries - reduce pain and stiffness 8) extra virgin olive oil - its compound olecanthal helps to block prostaglandins  9) chili peppers- can be eaten or applied topically via capsaicin 10) mint- helpful for headache, muscle aches, joint pain, and itching 11) garlic- reduces inflammation  Link to further information on diet for chronic pain: http://www.bray.com/  3. Sleep disturbance             Continue melatonin             Encouraged sleep hygiene   4. Neuropathic pain             See #1   5. Gait abnormality             Improved             Continue rolling walker, attempting to transition to cane             Cont HEP             Information given on core  strengthening exercises, patient more compliant             Will refer for PT   6. Myalgia             Good benefit with trigger point injections, will schedule again with Dr. Dalene Carrow   7. Right knee pain             MRI reviewed from 2016, showing meniscal/cartilage damage             Receiving injections from Ortho, cont             [place] denied             Cont voltaren gel             Cont lidoderm on knee             Improved   8. Right ankle pain - likely gout flare             Lidoderm patches, pt using Voltaren gel             Xrays reviewed, relatively uremarkable             Uric acid elevated             Improved    9. Rectal bleeding             Resolved   10.  Trigeminal neuralgia.              Improved per patient, recommend follow up with Neuro regarding medication d/c questions             Continue Lamictal 100 mg twice daily    11. PVD s/p redo right femoral to below-knee popliteal bypass as well as left common femoral enterectomy 12/09/2019.   Cont to follow up with Vascular

## 2022-08-11 NOTE — Patient Instructions (Signed)
 Subjective:    Patient ID: [name] [name], female    DOB: 11/18/1924, 89 y.o.   MRN: [id number]  HPI An audio/video tele-health visit is felt to be the most appropriate encounter for this patient at this time. See MyChart message from today for the patient's consent to a tele-health encounter with [facility]. This is a follow up tele-visit via phone. The patient is at home. MD is at office.    Female with pmh/psh of PAD, COPD, DVT, HTN, Gout, trigeminal neuralgia, post herpetic neuralgia, OA of right knee with injections, lumbar fusion present for follow up of pain in her back > leg pain.   Initially stated: Pt stated she has had back pain "all her life".  Getting progressively worse.  Movement improves the pain.  Standing and walking exacerbate the pain. Dull pain.  Non-radiating.  Intermittent.  Denies associated numbness, tingling, weakness.  Lidoderm patch help. Pain limits pt from doing things around the house.   She has been using tylenol, TENS, and lidocaine patches. She does not like heating pads as does not want to be tied down. Se has not tried any other medicines for  Since that time, pt states her back is hurting. She is doing HEP. She notes drowsiness with Baclofen. Denies falls. Rocking back and forth can help. Pain does radiate down her legs.   She feels the pain worst in the morning- then she can hardly get out of bed because the pain is severe.   Pain Inventory Average Pain 6 Pain Right Now 6 My pain is constant, sharp and stabbing  In the last 24 hours, has pain interfered with the following? General activity 6 Relation with others 3 Enjoyment of life 7 What TIME of day is your pain at its worst? morning  and night Sleep (in general) Poor  Pain is worse with: walking, standing, and some activites Pain improves with: rest and therapy/exercise Relief from Meds: 6  Family History  Problem Relation Age of Onset   Arthritis Mother     Hypertension Mother    Heart disease Father    Colon polyps Father    Breast cancer Sister    Breast cancer Daughter    Hypertension Son    Lung cancer Brother    Colon cancer Sister    Brain cancer Sister    Lung cancer Sister    Anesthesia problems Neg Hx    Hypotension Neg Hx    Malignant hyperthermia Neg Hx    Pseudochol deficiency Neg Hx    Social History   Socioeconomic History   Marital status: Single    Spouse name: Not on file   Number of children: 4   Years of education: 12   Highest education level: Not on file  Occupational History   Occupation: retired    Employer: RETIRED  Tobacco Use   Smoking status: Former    Packs/day: 2.00    Years: 30.00    Pack years: 60.00    Types: Cigarettes    Quit date: 06/18/1993    Years since quitting: 27.5   Smokeless tobacco: Never   Tobacco comments:    QUIT IN 1995  Vaping Use   Vaping Use: Never used  Substance and Sexual Activity   Alcohol use: No    Alcohol/week: 0.0 standard drinks   Drug use: No   Sexual activity: Not on file  Other Topics Concern   Not on file  Social History Narrative     Patient is widowed with 4 children. 2 grandkids. Lives alone.    Patient is right handed.   Patient has high school education.   Patient drinks 1 cup daily.      Retired from [employer] for 28 years, works 2 days a week until 2016.       Hobbies: volunteers at [place], [employer] from church visits people.       No caffeine.    Social Determinants of Health   Financial Resource Strain: Low Risk    Difficulty of Paying Living Expenses: Not hard at all  Food Insecurity: No Food Insecurity   Worried About Running Out of Food in the Last Year: Never true   Ran Out of Food in the Last Year: Never true  Transportation Needs: No Transportation Needs   Lack of Transportation (Medical): No   Lack of Transportation (Non-Medical): No  Physical Activity: Sufficiently Active   Days of Exercise per Week: 7  days   Minutes of Exercise per Session: 30 min  Stress: No Stress Concern Present   Feeling of Stress : Not at all  Social Connections: Socially Isolated   Frequency of Communication with Friends and Family: More than three times a week   Frequency of Social Gatherings with Friends and Family: Never   Attends Religious Services: Never   Active Member of Clubs or Organizations: No   Attends Club or Organization Meetings: Never   Marital Status: Widowed   Past Surgical History:  Procedure Laterality Date   ABDOMINAL AORTAGRAM N/A 06/17/2011   Procedure: ABDOMINAL AORTAGRAM;  Surgeon: Charles E Fields, MD;  Location: [facility];  Service: Cardiovascular;  Laterality: N/A;   ABDOMINAL AORTOGRAM W/LOWER EXTREMITY Bilateral 06/22/2018   Procedure: ABDOMINAL AORTOGRAM W/LOWER EXTREMITY;  Surgeon: Fields, Charles E, MD;  Location: [facility];  Service: Cardiovascular;  Laterality: Bilateral;   ABDOMINAL AORTOGRAM W/LOWER EXTREMITY Bilateral 11/29/2019   Procedure: ABDOMINAL AORTOGRAM W/LOWER EXTREMITY;  Surgeon: Fields, Charles E, MD;  Location: [facility];  Service: Cardiovascular;  Laterality: Bilateral;   CATARACT EXTRACTION, BILATERAL     w IOL   CHOLECYSTECTOMY  1998   COLONOSCOPY     ENDARTERECTOMY FEMORAL Left 12/09/2019   Procedure: ENDARTERECTOMY FEMORAL with bovine patch angioplasty.;  Surgeon: Fields, Charles E, MD;  Location: [facility];  Service: Vascular;  Laterality: Left;   FEMORAL-POPLITEAL BYPASS GRAFT        x2 surgeries 1990's & 2009   FEMORAL-POPLITEAL BYPASS GRAFT Right 12/09/2019   Procedure: REDO RIGHT FEMORAL-POPLITEAL ARTERY BYPASS GRAFT;  Surgeon: Fields, Charles E, MD;  Location: [facility];  Service: Vascular;  Laterality: Right;   INJECTION KNEE Right Aug. 2016   Gel injection for pain   LUMBAR FUSION  07/06/2011   TONSILLECTOMY     as a teenager    TUBAL LIGATION     Past Surgical History:  Procedure Laterality Date   ABDOMINAL AORTAGRAM N/A 06/17/2011    Procedure: ABDOMINAL AORTAGRAM;  Surgeon: Charles E Fields, MD;  Location: [facility];  Service: Cardiovascular;  Laterality: N/A;   ABDOMINAL AORTOGRAM W/LOWER EXTREMITY Bilateral 06/22/2018   Procedure: ABDOMINAL AORTOGRAM W/LOWER EXTREMITY;  Surgeon: Fields, Charles E, MD;  [facility]: [facility];  Service: Cardiovascular;  Laterality: Bilateral;   ABDOMINAL AORTOGRAM W/LOWER EXTREMITY Bilateral 11/29/2019   Procedure: ABDOMINAL AORTOGRAM W/LOWER EXTREMITY;  Surgeon: Fields, Charles E, MD;  [facility]: [facility];  Service: Cardiovascular;  Laterality: Bilateral;   CATARACT EXTRACTION, BILATERAL       w IOL   CHOLECYSTECTOMY  1998   COLONOSCOPY     ENDARTERECTOMY FEMORAL Left 12/09/2019   Procedure: ENDARTERECTOMY FEMORAL with bovine patch angioplasty.;  Surgeon: Fields, Charles E, MD;  Location: [facility];  Service: Vascular;  Laterality: Left;   FEMORAL-POPLITEAL BYPASS GRAFT        x2 surgeries 1990's & 2009   FEMORAL-POPLITEAL BYPASS GRAFT Right 12/09/2019   Procedure: REDO RIGHT FEMORAL-POPLITEAL ARTERY BYPASS GRAFT;  Surgeon: Fields, Charles E, MD;  Location: [facility];  Service: Vascular;  Laterality: Right;   INJECTION KNEE Right Aug. 2016   Gel injection for pain   LUMBAR FUSION  07/06/2011   TONSILLECTOMY     as a teenager    TUBAL LIGATION     Past Medical History:  Diagnosis Date   Acute on chronic diastolic heart failure (HCC) 12/30/2020   Allergic rhinoconjunctivitis    Allergy    Anxiety    pt. managed- uses deep breathing    Arthritis    low back , stenosis   COLONIC POLYPS, RECURRENT 08/29/2006   2008 last colonoscopy. No further colonoscopy.      COPD (chronic obstructive pulmonary disease) (HCC)    Diverticulosis    Dysrhythmia    afib   Eczema    Environmental allergies    allergy shot- q friday in Dr. Young's office. PFT's abnormal- recommended Spiriva to use preop & will d/c after surgery   GERD (gastroesophageal reflux disease)    Hiatal hernia     Hyperlipidemia    Hypertension    Lung nodule 2011   Paroxysmal atrial fibrillation (HCC) 11/27/2019   Peripheral vascular disease (HCC)    Shingles    Stenosis of popliteal artery (HCC)    blood clots in legs long ago     Trigeminal neuralgia    Left buttocks   There were no vitals taken for this visit.  Opioid Risk Score:   Fall Risk Score:  `1  Depression screen PHQ 2/9  Depression screen PHQ 2/9 12/09/2020 09/04/2020 08/18/2020 05/22/2020 02/20/2020 01/16/2020 12/05/2018  Decreased Interest 0 0 0 0 1 0 0  Down, Depressed, Hopeless 0 0 0 0 1 0 0  PHQ - 2 Score 0 0 0 0 2 0 0  Some recent data might be hidden   Review of Systems  Constitutional: Negative.   HENT: Negative.    Eyes: Negative.   Respiratory: Negative.    Cardiovascular: Negative.   Gastrointestinal: Negative.   Endocrine: Negative.   Genitourinary: Negative.   Musculoskeletal:  Positive for back pain and gait problem.  Skin: Negative.   Allergic/Immunologic: Negative.   Hematological:  Bruises/bleeds easily.       Eliquis  Psychiatric/Behavioral: Negative.    All other systems reviewed and are negative.    Objective:   Physical Exam  Performed via phone.     Assessment & Plan:  Female with pmhpsh of PAD, COPD, DVT, HTN, Gout, trigeminal neuralgia, post herpetic neuralgia, OA of right knee with injections, lumbar fusion present for follow up of pain in her back > leg pain.     1. Chronic low back pain with failed back syndrome              Xray of L-spine and right SI joint reviewed, SI joint unremarkable, degenerative changes in spine with right scoliosis.               Will avoid NSAIDs due to coumadin               Stopped using Robaxin due to nausea             Unable to tolerate Gabapentin             Cont heat  Repeat MRI lumbar spine, last 2012, to help see if facet athropathy.              Cont tylenol, limit to 2000mg/day             Continue Lidoderm patches  Will refer to PT now that patient  has transportation             Resume TENS unit PRN             Continue  Cymbalta to 60 mg  Baclofen changed to 5 qhs  -Lumbar transforaminal ESI is indicated for the following levels: The patient has low back pain; lower extremity pain; paresthesias; increased pain with coughing, sneezing, and/or straining; lower extremity weakness; and gait disturbance. The patient has no bowel or bladder dysfunction or saddle anesthesia. The patient has failed months of conservative care with physical therapy and HEP, medications, activity and lifestyle modification, and TENS. MRI was performed and shows stenosis throughout her spine- worse at the lumbar and thoracic levels. The patient has never had ESI before.    -Discussed that Eliquis would have to be stopped for 3 days before procedure so she would like to clear with Dr. [place] before.   -Discussed Meloxicam- she will clear with Dr [place]   2. Possible Sacroiliitis bilaterally             Xray unremakrable             Minimal benefit with bracing              Discussed b/l SI joint injections -Discussed benefits of exercise in reducing pain. -Discussed following foods that may reduce pain: 1) Ginger (especially studied for arthritis)- reduce leukotriene production to decrease inflammation 2) Blueberries- high in phytonutrients that decrease inflammation 3) Salmon- marine omega-3s reduce joint swelling and pain 4) Pumpkin seeds- reduce inflammation 5) dark chocolate- reduces inflammation 6) turmeric- reduces inflammation 7) tart cherries - reduce pain and stiffness 8) extra virgin olive oil - its compound olecanthal helps to block prostaglandins  9) chili peppers- can be eaten or applied topically via capsaicin 10) mint- helpful for headache, muscle aches, joint pain, and itching 11) garlic- reduces inflammation  Link to further information on diet for chronic pain:  [email or web address]  3. Sleep disturbance             Continue melatonin             Encouraged sleep hygiene   4. Neuropathic pain             See #1   5. Gait abnormality             Improved             Continue rolling walker, attempting to transition to cane             Cont HEP             Information given on core strengthening exercises, patient more compliant             Will refer for PT   6. Myalgia             Good benefit with trigger point injections, will schedule again with Dr. Raulker   7. Right   knee pain             MRI reviewed from 2016, showing meniscal/cartilage damage             Receiving injections from Ortho, cont             [place] denied             Cont voltaren gel             Cont lidoderm on knee             Improved   8. Right ankle pain - likely gout flare             Lidoderm patches, pt using Voltaren gel             Xrays reviewed, relatively uremarkable             Uric acid elevated             Improved    9. Rectal bleeding             Resolved   10.  Trigeminal neuralgia.              Improved per patient, recommend follow up with Neuro regarding medication d/c questions             Continue Lamictal 100 mg twice daily    11. PVD s/p redo right femoral to below-knee popliteal bypass as well as left common femoral enterectomy 12/09/2019.   Cont to follow up with Vascular  10 minutes spent in discussion of her pain, her MRI results, the risks and benefits of steroids injections and meloxicam, clearing with cardiology 

## 2022-08-15 ENCOUNTER — Other Ambulatory Visit: Payer: Self-pay | Admitting: Family Medicine

## 2022-08-18 ENCOUNTER — Encounter: Payer: Medicare Other | Admitting: Physical Medicine and Rehabilitation

## 2022-08-22 ENCOUNTER — Other Ambulatory Visit: Payer: Self-pay | Admitting: Family Medicine

## 2022-08-26 ENCOUNTER — Encounter: Payer: Self-pay | Admitting: Family Medicine

## 2022-08-26 ENCOUNTER — Ambulatory Visit (INDEPENDENT_AMBULATORY_CARE_PROVIDER_SITE_OTHER): Payer: Medicare Other | Admitting: Family Medicine

## 2022-08-26 VITALS — BP 120/70 | HR 88 | Temp 97.3°F | Ht 59.0 in | Wt 167.8 lb

## 2022-08-26 DIAGNOSIS — I5032 Chronic diastolic (congestive) heart failure: Secondary | ICD-10-CM | POA: Diagnosis not present

## 2022-08-26 DIAGNOSIS — I4819 Other persistent atrial fibrillation: Secondary | ICD-10-CM | POA: Diagnosis not present

## 2022-08-26 DIAGNOSIS — N183 Chronic kidney disease, stage 3 unspecified: Secondary | ICD-10-CM | POA: Diagnosis not present

## 2022-08-26 DIAGNOSIS — I739 Peripheral vascular disease, unspecified: Secondary | ICD-10-CM

## 2022-08-26 NOTE — Patient Instructions (Addendum)
discussed could maybe one day a week take lasix 40 mg about 6 hours apart and see if that helps  - otherwise continue daily  Agree with follow up with cardiology to discuss monitoring  Let us know if fatigue worsens further  Hope back pain continues to improve  Recommended follow up: Return in about 3 months (around 11/26/2022) for followup or sooner if needed.Schedule b4 you leave.

## 2022-08-26 NOTE — Progress Notes (Signed)
Phone [phone] In person visit   Subjective:   [name] [name] is a 89 y.o. year old very pleasant female patient who presents for/with See problem oriented charting Chief Complaint  Patient presents with   Medical Management of Chronic Issues   Hypertension   Hyperlipidemia   Past Medical History-  Patient Active Problem List   Diagnosis Date Noted   Chronic diastolic heart failure (HCC) 12/30/2020    Priority: High   Status post femoral-popliteal bypass surgery 12/09/2019    Priority: High   Persistent atrial fibrillation (HCC) 12/03/2019    Priority: High   Pain in right ankle and joints of right foot 08/28/2014    Priority: High   Peripheral arterial disease (HCC) 06/29/2007    Priority: High   Vertigo 05/04/2022    Priority: Medium    Left groin hernia 05/22/2020    Priority: Medium    CKD (chronic kidney disease), stage III (HCC) 08/07/2017    Priority: Medium    Acute gout of left foot 03/22/2016    Priority: Medium    Trigeminal neuralgia     Priority: Medium    Chronic low back pain 09/13/2010    Priority: Medium    COPD mixed type (HCC) 01/22/2010    Priority: Medium    Insulin resistance 01/06/2009    Priority: Medium    Hyperlipidemia 10/23/2006    Priority: Medium    Essential hypertension 10/23/2006    Priority: Medium    ESOPHAGEAL STRICTURE 08/09/1993    Priority: Medium    Secondary hypercoagulable state (HCC) 12/03/2019    Priority: Low   Syncope 12/29/2011    Priority: Low   Allergic rhinitis due to pollen 06/25/2010    Priority: Low   Solitary pulmonary nodule 01/12/2010    Priority: Low   Osteopenia 01/06/2009    Priority: Low   Eczema 11/26/2007    Priority: Low   Chronic bilateral low back pain with right-sided sciatica 08/29/2019    Priority: 1.   Unilateral primary osteoarthritis, right knee 04/07/2016    Priority: 1.   Mild sleep apnea 05/03/2021   Anxiety state    Rectal bleeding    Ischemic colitis (HCC)    Gross  hematuria 06/01/2018    Medications- reviewed and updated Current Outpatient Medications  Medication Sig Dispense Refill   acetaminophen (TYLENOL) 500 MG tablet Take 2 tablets (1,000 mg total) by mouth 2 (two) times daily. 30 tablet 0   apixaban (ELIQUIS) 5 MG TABS tablet Take 1 tablet by mouth twice daily 180 tablet 1   atorvastatin (LIPITOR) 40 MG tablet Take 1 tablet by mouth once daily 90 tablet 0   diclofenac Sodium (VOLTAREN) 1 % GEL Apply 2 g topically 4 (four) times daily.     famotidine (PEPCID) 20 MG tablet Take 20mg  in the evening for 1 week. Then take as needed for indigestion. 30 tablet 2   furosemide (LASIX) 40 MG tablet Take 1 tablet (40 mg total) by mouth daily as needed for edema. 30 tablet 11   ipratropium (ATROVENT) 0.06 % nasal spray USE 2 SPRAY(S) IN EACH NOSTRIL TWICE DAILY 15 mL 0   lidocaine (LIDODERM) 5 % Place 1 patch onto the skin daily. Remove & Discard patch within 12 hours or as directed by MD 90 patch 3   lisinopril (ZESTRIL) 5 MG tablet Take 1 tablet (5 mg total) by mouth daily. 90 tablet 3   meclizine (ANTIVERT) 25 MG tablet TAKE 1/2 (ONE-HALF) TABLET BY MOUTH THREE  TIMES DAILY AS NEEDED FOR  VERTIGO 30 tablet 0   melatonin 3 MG TABS tablet Take 2 tablets (6 mg total) by mouth at bedtime as needed (sleep).  0   metoprolol succinate (TOPROL-XL) 100 MG 24 hr tablet Take 1 tablet (100 mg total) by mouth daily. Take with or immediately following a meal. 90 tablet 3   Multiple Vitamin (MULTIVITAMIN WITH MINERALS) TABS tablet Take 1 tablet by mouth daily.     pantoprazole (PROTONIX) 40 MG tablet TAKE 1 TABLET BY MOUTH TWICE DAILY AS DIRECTED 60 tablet 0   RESTASIS 0.05 % ophthalmic emulsion Place 1 drop into both eyes 2 (two) times daily as needed (dry eyes).   99   senna-docusate (SENOKOT-S) 8.6-50 MG tablet Take 1 tablet by mouth at bedtime as needed for mild constipation.     No current facility-administered medications for this visit.     Objective:  BP  120/70   Pulse 88   Temp (!) 97.3 F (36.3 C)   Ht 4\' 11"  (1.499 m)   Wt 167 lb 12.8 oz (76.1 kg)   SpO2 96%   BMI 33.89 kg/m  Gen: NAD, resting comfortably CV: irregularly irregular  no murmurs rubs or gallops Lungs: CTAB no crackles, wheeze, rhonchi Ext: trace or perhaps slightly greater than that edema Skin: warm, dry    Assessment and Plan   #Back pain- reports "chilli pack" really helpful with capsaicin. Also with plan to consider ERSI. Working with Dr. Dalene Carrow  #vertigo- likely BENIGN PAROXYSMAL POSITIONAL VERTIGO (BPPV)- vestibular rehab helpful in past- trying to do through home health but she did not hear back from them- we are working with referral team  #Fatigue- has some fatigue and family wonders if related to eating habits- most of her life only eats if really hungry so does not always eat consistently. Ends up just wanting desserts a lot and those are not the best choices. Discussed fruits/veggies/proteins could help her feel better- trying to get some ensure in and eating good meals- feels better on those days.  -aging and pain likely contribute to fatigue. Labs reassuring last month TSH, CMP, CBC and not due for a1c yet - possibly being in a fib could contribute- feels more tired today and appears to be in a fib but cannot confirm without EKG- may do cardiac monitoring with cardiology  #PAD-actually on warfarin for arterial thrombosis in the past now on Eliquis for A. fib as well. Dr. Darrick Penna (now Dr. Chestine Spore) ok with compression stockings #Hyperlipidemia  S: Compliant with atorvastatin 40 mg.  LDL goal 70 or less due to arterial disease. -no calf or thigh pain with walking Lab Results  Component Value Date   CHOL 106 05/06/2022   HDL 46 05/06/2022   LDLCALC 37 05/06/2022   LDLDIRECT 46.0 06/10/2019   TRIG 116 05/06/2022   CHOLHDL 2.3 05/06/2022  A/P: peripheral arterial disease stable- continue current medicines  Lipids at goal for peripheral arterial disease  under 70 for LDL- continue current medications   # Atrial fibrillation S: Rate controlled with metoprolol 100 mg extended release Anticoagulated with Eliquis 5 mg twice daily- reevaluate dose if cr raises above 1.5   A/P: appropriately anticoagulated and rate controlled- continue current medicine   #Diastolic CHF- sees dr. Duke Salvia #Essential hypertension S: medication: Lisinopril 5 mg, metoprolol 100 mg XR and lasix 40 mg daily prn-in 2024 taking daily  Edema:  up slightly Weight gain: up 2 lbs from last visit A/P: CHF possibly with  slightly more fluid on board- discussed could maybe one day a week take lasix 6 hours apart and see if that helps    #Chronic kidney disease stage III S: Patient's GFR has been stable in the 30s in 2023.  She knows to avoid NSAIDs A/P:overall stable- continue to monitor- just had labs a month ago  #Insulin resistance/obesity/prediabetes- peak a1c 6.2 in 2021 S: noted Lab Results  Component Value Date   HGBA1C 6.0 (H) 05/05/2022   HGBA1C 6.1 08/20/2020   HGBA1C 5.6 02/19/2020  A/P: reasonably stable- too soon for repeat   Recommended follow up: Return in about 3 months (around 11/26/2022) for followup or sooner if needed.Schedule b4 you leave. Future Appointments  Date Time Provider Department Center  09/16/2022  3:10 PM Alver Sorrow, NP [facility]  01/16/2023  2:45 PM Erroll Luna, RPH [facility] None  02/10/2023  1:00 PM [facility] ANNUAL WELLNESS VISIT 1 [facility]    Lab/Order associations:   ICD-10-CM   1. Chronic diastolic heart failure (HCC)  Z61.09     2. Peripheral arterial disease (HCC)  I73.9     3. Persistent atrial fibrillation (HCC)  I48.19     4. Stage 3 chronic kidney disease, unspecified whether stage 3a or 3b CKD (HCC)  N18.30       No orders of the defined types were placed in this encounter.   Return precautions advised.  Tana Conch, MD

## 2022-08-29 ENCOUNTER — Telehealth: Payer: Self-pay | Admitting: Orthopaedic Surgery

## 2022-08-29 NOTE — Telephone Encounter (Signed)
VOB submitted for Monovisc, right knee  

## 2022-08-29 NOTE — Telephone Encounter (Signed)
Pt called requesting to submit for her 6 month SynviscOne knee gel injection. Please call pt when approved

## 2022-08-30 NOTE — Telephone Encounter (Signed)
Form was updated & sent back to provider. Placed in provider's box for completion.

## 2022-08-30 NOTE — Telephone Encounter (Signed)
yes

## 2022-08-31 ENCOUNTER — Telehealth: Payer: Self-pay | Admitting: Family Medicine

## 2022-08-31 NOTE — Telephone Encounter (Signed)
[facility] is calling about a verbal order that they faxed to [place] on 08/11/22 and 08/24/22. Please call her back. It is very important and needs to be expedited ASAP Because of insurance.   Lynden Ang [phone]

## 2022-09-01 NOTE — Telephone Encounter (Signed)
Form completed and faxed. 

## 2022-09-01 NOTE — Telephone Encounter (Signed)
Form has been completed and faxed.

## 2022-09-12 ENCOUNTER — Other Ambulatory Visit: Payer: Self-pay | Admitting: Family Medicine

## 2022-09-14 ENCOUNTER — Ambulatory Visit: Payer: Medicare Other | Admitting: Physical Therapy

## 2022-09-16 ENCOUNTER — Ambulatory Visit (INDEPENDENT_AMBULATORY_CARE_PROVIDER_SITE_OTHER): Payer: Medicare Other | Admitting: Family

## 2022-09-16 ENCOUNTER — Encounter (HOSPITAL_BASED_OUTPATIENT_CLINIC_OR_DEPARTMENT_OTHER): Payer: Self-pay | Admitting: Family

## 2022-09-16 VITALS — BP 126/72 | HR 63 | Ht 59.0 in | Wt 165.0 lb

## 2022-09-16 DIAGNOSIS — I4819 Other persistent atrial fibrillation: Secondary | ICD-10-CM | POA: Diagnosis not present

## 2022-09-16 DIAGNOSIS — D6859 Other primary thrombophilia: Secondary | ICD-10-CM

## 2022-09-16 DIAGNOSIS — Z6833 Body mass index (BMI) 33.0-33.9, adult: Secondary | ICD-10-CM

## 2022-09-16 DIAGNOSIS — R251 Tremor, unspecified: Secondary | ICD-10-CM

## 2022-09-16 DIAGNOSIS — I5032 Chronic diastolic (congestive) heart failure: Secondary | ICD-10-CM

## 2022-09-16 DIAGNOSIS — I10 Essential (primary) hypertension: Secondary | ICD-10-CM | POA: Diagnosis not present

## 2022-09-16 NOTE — Progress Notes (Signed)
Office Visit    Patient Name: [name] [name] Date of Encounter: 09/16/2022  PCP:  [name] Majestic, MD   [facility]  Cardiologist:  [name] Si, MD  Advanced Practice Provider:  No care team member to display Electrophysiologist:  None      Chief Complaint    [name] [name] is a 89 y.o. female presents today for heart failure and atrial fibrillation follow-up  Past Medical History    Past Medical History:  Diagnosis Date   Acute on chronic diastolic heart failure (HCC) 12/30/2020   Allergic rhinoconjunctivitis    Allergy    Anxiety    pt. managed- uses deep breathing    Arthritis    low back , stenosis   COLONIC POLYPS, RECURRENT 08/29/2006   2008 last colonoscopy. No further colonoscopy.      COPD (chronic obstructive pulmonary disease) (HCC)    Diverticulosis    Dysrhythmia    afib   Eczema    Environmental allergies    allergy shot- q friday in Dr. Roxy Cedar office. PFT's abnormal- recommended Spiriva to use preop & will d/c after surgery   GERD (gastroesophageal reflux disease)    Hiatal hernia    Hyperlipidemia    Hypertension    Lung nodule 2011   Paroxysmal atrial fibrillation (HCC) 11/27/2019   Peripheral vascular disease (HCC)    Shingles    Stenosis of popliteal artery (HCC)    blood clots in legs long ago     Trigeminal neuralgia    Left buttocks   Past Surgical History:  Procedure Laterality Date   ABDOMINAL AORTAGRAM N/A 06/17/2011   Procedure: ABDOMINAL Ronny Flurry;  Surgeon: Sherren Kerns, MD;  Location: [facility] CATH LAB;  Service: Cardiovascular;  Laterality: N/A;   ABDOMINAL AORTOGRAM W/LOWER EXTREMITY Bilateral 06/22/2018   Procedure: ABDOMINAL AORTOGRAM W/LOWER EXTREMITY;  Surgeon: Sherren Kerns, MD;  Location: [facility] INVASIVE CV LAB;  Service: Cardiovascular;  Laterality: Bilateral;   ABDOMINAL AORTOGRAM W/LOWER EXTREMITY Bilateral 11/29/2019   Procedure: ABDOMINAL AORTOGRAM W/LOWER EXTREMITY;  Surgeon: Sherren Kerns, MD;  Location: [facility] INVASIVE CV LAB;  Service: Cardiovascular;  Laterality: Bilateral;   CATARACT EXTRACTION, BILATERAL     w IOL   CHOLECYSTECTOMY  1998   COLONOSCOPY     ENDARTERECTOMY FEMORAL Left 12/09/2019   Procedure: ENDARTERECTOMY FEMORAL with bovine patch angioplasty.;  Surgeon: Sherren Kerns, MD;  Location: [facility] OR;  Service: Vascular;  Laterality: Left;   FEMORAL-POPLITEAL BYPASS GRAFT        x2 surgeries 1990's & 2009   FEMORAL-POPLITEAL BYPASS GRAFT Right 12/09/2019   Procedure: REDO RIGHT FEMORAL-POPLITEAL ARTERY BYPASS GRAFT;  Surgeon: Sherren Kerns, MD;  Location: [facility] OR;  Service: Vascular;  Laterality: Right;   INJECTION KNEE Right Aug. 2016   Gel injection for pain   LUMBAR FUSION  07/06/2011   TONSILLECTOMY     as a teenager    TUBAL LIGATION     Allergies  Allergies  Allergen Reactions   Sulfa Antibiotics Other (See Comments)    Cold sweat light headed and disorientation   Tiotropium Bromide Shortness Of Breath and Other (See Comments)    Sore throat also   Latex Itching and Rash   Ultram [Tramadol] Nausea And Vomiting   5-Alpha Reductase Inhibitors     History of Present Illness    [name] [name] is a 89 y.o. female with a hx of hypertension, hyperlipidemia, COPD, persist atrial fibrillation, PAD s/p right  fifth femoral to above-knee popliteal bypass with revision in 2010 in last seen 02/14/2022 by Dr. Duke [name]  At last clinic visit 6/60/23 she was transitioned from lisinopril to [facility] half tablet of 24-26 mg twice daily due to lower extremity edema.  She is to take Lasix 40 mg daily for 2 days then 20 mg daily.  Echo ordered and performed 10/12/2021 with LVEF 65 to 70%, no RWMA, mild LVH, indeterminate diastolic parameters due to atrial fibrillation, LA mildly dilated, mild MR. She contacted the office 10/20/2021 noting feeling as if she was in fog since starting [place].  She was recommended to stop Entresto and resume lisinopril 36 hours later.  She  was started on spironolactone however did not tolerate due to nausea, fuzzy brained, fatigue.  Last saw Dr. Duke [name] 01/2022 doing well from a cardiac perspective recommend follow-up in 6 months.  Admitted 1/10 - 05/10/2022 with vertigo discharged with vestibular rehab.  Discharged to SNF and eventually return home.  She presents today for follow-up with her son Lorin [name]. Notes earlier this week she fell for the first time. No noted prior symptoms but felt as if someone knocked her knees out from under her and landed on her knees. No loss of consciousness. She notes recently has felt a few little "tremors" at different spots in her body. Felt her knees trembled a bit prior to her falling. Notes they are infrequent, short. No noted exacerbating nor relieving factors.  She and her family members are interested in pursuing monitor related to atrial fibrillation as previously discussed to rule out significant pauses or determine how much of her symptomology related to atrial fibrillation.  She has been taking furosemide daily in the morning and an extra tablet in the afternoon on Wednesdays.  Does note more swelling recently which improved with extra fluid pill this week.  Weight at home 160-162 pounds at her lowest.  No orthopnea, PND. Reviewed low-sodium diet and fluid restrictions which she has not routinely been following.  GERD lengthy discussion regarding pathophysiology and treatment of atrial fibrillation as well as diastolic heart failure.  Prior antihypertensives Entresto-fatigue Spironolactone-nausea, fatigue  EKGs/Labs/Other Studies Reviewed:   The following studies were reviewed today:  Cardiac Studies & Procedures       ECHOCARDIOGRAM  ECHOCARDIOGRAM COMPLETE 05/05/2022  Narrative ECHOCARDIOGRAM REPORT    Patient Name:   [name] [name] Date of Exam: 05/05/2022 Medical Rec #:  [id number]        Height:       59.0 in Accession #:    [id number]       Weight:       166.0 lb Date of  Birth:  1925-01-01        BSA:          1.704 m Patient Age:    89 years         BP:           120/79 mmHg Patient Gender: F                HR:           91 bpm. Exam Location:  Inpatient  Procedure: 2D Echo, 3D Echo, Cardiac Doppler and Color Doppler  Indications:    TIA G45.9  History:        Patient has prior history of Echocardiogram examinations, most recent 10/20/2021. COPD and PAD, Arrythmias:Atrial Fibrillation; Risk Factors:Former Smoker, Hypertension and Dyslipidemia.  Sonographer:    Leta Jungling RDCS Referring Phys: [phone] DEBBY  CROSLEY  IMPRESSIONS   1. Left ventricular ejection fraction, by estimation, is 65 to 70%. The left ventricle has normal function. The left ventricle has no regional wall motion abnormalities. Left ventricular diastolic parameters are indeterminate. 2. Right ventricular systolic function is low normal. The right ventricular size is normal. There is normal pulmonary artery systolic pressure. 3. The mitral valve is degenerative. Trivial mitral valve regurgitation. Mild mitral stenosis. The mean mitral valve gradient is 2.0 mmHg. 4. The aortic valve is calcified. Aortic valve regurgitation is not visualized. Aortic valve sclerosis/calcification is present, without any evidence of aortic stenosis. Aortic valve mean gradient measures 4.5 mmHg. Aortic valve Vmax measures 1.43 m/s. 5. The inferior vena cava is normal in size with greater than 50% respiratory variability, suggesting right atrial pressure of 3 mmHg.  FINDINGS Left Ventricle: Left ventricular ejection fraction, by estimation, is 65 to 70%. The left ventricle has normal function. The left ventricle has no regional wall motion abnormalities. The left ventricular internal cavity size was normal in size. There is no left ventricular hypertrophy. Left ventricular diastolic parameters are indeterminate.  Right Ventricle: The right ventricular size is normal. No increase in right ventricular wall  thickness. Right ventricular systolic function is low normal. There is normal pulmonary artery systolic pressure. The tricuspid regurgitant velocity is 2.59 m/s, and with an assumed right atrial pressure of 3 mmHg, the estimated right ventricular systolic pressure is 29.8 mmHg.  Left Atrium: Left atrial size was normal in size.  Right Atrium: Right atrial size was normal in size.  Pericardium: There is no evidence of pericardial effusion. Presence of epicardial fat layer.  Mitral Valve: The mitral valve is degenerative in appearance. There is moderate thickening of the mitral valve leaflet(s). There is moderate calcification of the mitral valve leaflet(s). Moderately decreased mobility of the mitral valve leaflets. Mild to moderate mitral annular calcification. Trivial mitral valve regurgitation. Mild mitral valve stenosis. MV peak gradient, 6.3 mmHg. The mean mitral valve gradient is 2.0 mmHg.  Tricuspid Valve: The tricuspid valve is grossly normal. Tricuspid valve regurgitation is mild . No evidence of tricuspid stenosis.  Aortic Valve: The aortic valve is calcified. Aortic valve regurgitation is not visualized. Aortic valve sclerosis/calcification is present, without any evidence of aortic stenosis. Aortic valve mean gradient measures 4.5 mmHg. Aortic valve peak gradient measures 8.2 mmHg.  Pulmonic Valve: The pulmonic valve was not well visualized. Pulmonic valve regurgitation is not visualized. No evidence of pulmonic stenosis.  Aorta: The aortic root and ascending aorta are structurally normal, with no evidence of dilitation.  Venous: The inferior vena cava is normal in size with greater than 50% respiratory variability, suggesting right atrial pressure of 3 mmHg.  IAS/Shunts: No atrial level shunt detected by color flow Doppler.   LEFT VENTRICLE PLAX 2D LVIDd:         4.20 cm   Diastology LVIDs:         2.00 cm   LV e' medial:    3.87 cm/s LV PW:         0.90 cm   LV E/e' medial:   34.2 LV IVS:        1.00 cm   LV e' lateral:   6.55 cm/s LVOT diam:     1.60 cm   LV E/e' lateral: 20.2 LVOT Area:     2.01 cm  3D Volume EF: 3D EF:        54 % LV EDV:       53 ml  LV ESV:       24 ml LV SV:        29 ml  RIGHT VENTRICLE RV S prime:     8.11 cm/s TAPSE (M-mode): 1.2 cm  LEFT ATRIUM             Index        RIGHT ATRIUM           Index LA diam:        4.20 cm 2.46 cm/m   RA Area:     11.00 cm LA Vol (A2C):   38.1 ml 22.36 ml/m  RA Volume:   19.70 ml  11.56 ml/m LA Vol (A4C):   45.2 ml 26.53 ml/m LA Biplane Vol: 43.5 ml 25.53 ml/m AORTIC VALVE AV Vmax:      143.50 cm/s AV Vmean:     100.170 cm/s AV VTI:       0.302 m AV Peak Grad: 8.2 mmHg AV Mean Grad: 4.5 mmHg  AORTA Ao Root diam: 3.30 cm Ao Asc diam:  3.20 cm  MITRAL VALVE                TRICUSPID VALVE MV Area (PHT): 4.12 cm     TR Peak grad:   26.8 mmHg MV Peak grad:  6.3 mmHg     TR Vmax:        259.00 cm/s MV Mean grad:  2.0 mmHg MV Vmax:       1.25 m/s     SHUNTS MV Vmean:      61.5 cm/s    Systemic Diam: 1.60 cm MV Decel Time: 184 msec MV E velocity: 132.33 cm/s  Dietrich Pates MD Electronically signed by Dietrich Pates MD Signature Date/Time: 05/05/2022/9:19:35 AM    Final              EKG: No EKG today  Recent Labs: 11/04/2021: BNP 212.9 08/03/2022: ALT 9; BUN 31; Creatinine, Ser 1.34; Hemoglobin 13.9; Platelets 173.0; Potassium 4.1; Sodium 141; TSH 4.20  Recent Lipid Panel    Component Value Date/Time   CHOL 106 05/06/2022 0418   CHOL 129 02/24/2022 0947   TRIG 116 05/06/2022 0418   TRIG 141 04/10/2006 0812   HDL 46 05/06/2022 0418   HDL 53 02/24/2022 0947   CHOLHDL 2.3 05/06/2022 0418   VLDL 23 05/06/2022 0418   LDLCALC 37 05/06/2022 0418   LDLCALC 53 02/24/2022 0947   LDLDIRECT 46.0 06/10/2019 1132    Risk Assessment/Calculations:    CHA2DS2-VASc Score = 6   This indicates a 9.7% annual risk of stroke. The patient's score is based upon: CHF History: 1 HTN  History: 1 Diabetes History: 0 Stroke History: 0 Vascular Disease History: 1 Age Score: 2 Gender Score: 1        Home Medications   Current Meds  Medication Sig   acetaminophen (TYLENOL) 500 MG tablet Take 2 tablets (1,000 mg total) by mouth 2 (two) times daily.   apixaban (ELIQUIS) 5 MG TABS tablet Take 1 tablet by mouth twice daily   atorvastatin (LIPITOR) 40 MG tablet Take 1 tablet by mouth once daily   diclofenac Sodium (VOLTAREN) 1 % GEL Apply 2 g topically 4 (four) times daily.   famotidine (PEPCID) 20 MG tablet Take 20mg  in the evening for 1 week. Then take as needed for indigestion.   furosemide (LASIX) 40 MG tablet Take 1 tablet (40 mg total) by mouth daily as needed for edema.   ipratropium (ATROVENT) 0.06 %  nasal spray USE 2 SPRAY(S) IN EACH NOSTRIL TWICE DAILY   lidocaine (LIDODERM) 5 % Place 1 patch onto the skin daily. Remove & Discard patch within 12 hours or as directed by MD   lisinopril (ZESTRIL) 5 MG tablet Take 1 tablet (5 mg total) by mouth daily.   meclizine (ANTIVERT) 25 MG tablet TAKE 1/2 (ONE-HALF) TABLET BY MOUTH THREE TIMES DAILY AS NEEDED FOR VERTIGO   melatonin 3 MG TABS tablet Take 2 tablets (6 mg total) by mouth at bedtime as needed (sleep).   metoprolol succinate (TOPROL-XL) 100 MG 24 hr tablet Take 1 tablet (100 mg total) by mouth daily. Take with or immediately following a meal.   Multiple Vitamin (MULTIVITAMIN WITH MINERALS) TABS tablet Take 1 tablet by mouth daily.   pantoprazole (PROTONIX) 40 MG tablet TAKE 1 TABLET BY MOUTH TWICE DAILY AS DIRECTED   RESTASIS 0.05 % ophthalmic emulsion Place 1 drop into both eyes 2 (two) times daily as needed (dry eyes).    senna-docusate (SENOKOT-S) 8.6-50 MG tablet Take 1 tablet by mouth at bedtime as needed for mild constipation.     Review of Systems      All other systems reviewed and are otherwise negative except as noted above.  Physical Exam    VS:  BP 126/72   Pulse 63   Ht 4\' 11"  (1.499 m)   Wt  165 lb (74.8 kg)   SpO2 99%   BMI 33.33 kg/m  , BMI Body mass index is 33.33 kg/m.  Wt Readings from Last 3 Encounters:  09/16/22 165 lb (74.8 kg)  08/26/22 167 lb 12.8 oz (76.1 kg)  08/11/22 166 lb 9.6 oz (75.6 kg)     GEN: Well nourished, overweight, well developed, in no acute distress. HEENT: normal. Neck: Supple, no JVD, carotid bruits, or masses. Cardiac: IRIR, no murmurs, rubs, or gallops. No clubbing, cyanosis. Non pitting LE edema.  Radials/PT 2+ and equal bilaterally.  Respiratory:  Respirations regular and unlabored, clear to auscultation bilaterally. GI: Soft, nontender, nondistended. MS: No deformity or atrophy. Skin: Warm and dry, no rash. Neuro:  Strength and sensation are intact. Psych: Normal affect.  Assessment & Plan    HTN - BP well controlled. Continue current antihypertensive regimen lisinopril 5 mg nightly, Toprol 100 mg daily, Lasix 40 mg daily with additional 40 mg in the afternoon on Wednesdays.  Did not tolerate Entresto due to fatigue.  Chronic diastolic heart failure /lower extremity edema/mild mitral stenosis-echo 04/2022 normal LVEF 65 to 70%, no RWMA, diastolic function indeterminate, mild mitral stenosis.  Previously intolerant to Entresto, spironolactone.  Encouraged low-sodium diet and restriction of less than 2 L of fluid per day. Lower extremity elevation as well as compression socks encouraged.  Continue Lasix 40 mg daily with additional 40 mg in the afternoon on Wednesdays.  She may take an extra tablet as needed for weight gain of 2 pounds overnight or 5 pounds in 1 week.  Update BMP for monitoring of renal function.  Tremor - intermittent with no clear pattern. No exacerbating or relieving factors.  Happens in her knee or hand or other extremity.  Update magnesium level and potassium to rule out electrolyte abnormality.  Persistent atrial fibrillation /hypercoagulable state -rate controlled auscultation today.  Continue Toprol 100 mg daily.   Continue Eliquis 5 mg twice daily.  Careful monitoring of renal function to ensure creatinine remains less than 1.5. CHA2DS2-VASc Score = 6 [CHF History: 1, HTN History: 1, Diabetes History: 0, Stroke History: 0,  Vascular Disease History: 1, Age Score: 2, Gender Score: 1].  Therefore, the patient's annual risk of stroke is 9.7 %.    Plan for 14-day monitor due to fatigue and recent fall to rule out significant pauses and to assess atrial fibrillation burden as well as how her symptoms relate (or may not relate) to atrial fibrillation.   BMI 33 - Weight loss via diet and exercise encouraged. Discussed the impact being overweight would have on cardiovascular risk.   Vertigo - Admission 04/2022. Vestibular consult next week.   GERD / Hiatal hernia - Continue to follow with PCP.   PAD -s/p redo right femoropopliteal bypass 11/2019.  Follows with [facility].  Denies claudication symptoms. No aspirin due to [facility].Continue Atorvastatin.   Disposition: Follow up in 2 months with [name] Si, MD   Signed, Alver Sorrow, NP 09/16/2022, 4:58 PM [facility]

## 2022-09-16 NOTE — Patient Instructions (Addendum)
Medication Instructions:  Your physician has recommended you make the following change in your medication: Continue Lasix 40mg  daily int he morning with extra dose in the afternoon on Wednesdays.   May take an extra dose in the afternoon as needed for weight gain of 2lbs overnight or 5 lbs in one week.  *If you need a refill on your cardiac medications before your next appointment, please call your pharmacy*   Lab Work: Your physician recommends that you return for lab work today: BMP, magnesium  If you have labs (blood work) drawn today and your tests are completely normal, you will receive your results only by: MyChart Message (if you have MyChart) OR A paper copy in the mail If you have any lab test that is abnormal or we need to change your treatment, we will call you to review the results.   Testing/Procedures: Your physician has recommended that you wear a Zio monitor.   This monitor is a medical device that records the heart's electrical activity. Doctors most often use these monitors to diagnose arrhythmias. Arrhythmias are problems with the speed or rhythm of the heartbeat. The monitor is a small device applied to your chest. You can wear one while you do your normal daily activities. While wearing this monitor if you have any symptoms to push the button and record what you felt. Once you have worn this monitor for the period of time provider prescribed (14 days), you will return the monitor device in the postage paid box. Once it is returned they will download the data collected and provide [place] with a report which the provider will then review and we will call you with those results. Important tips:  Avoid showering during the first 24 hours of wearing the monitor. Avoid excessive sweating to help maximize wear time. Do not submerge the device, no hot tubs, and no swimming pools. Keep any lotions or oils away from the patch. After 24 hours you may shower with the patch on. Take  brief showers with your back facing the shower head.  Do not remove patch once it has been placed because that will interrupt data and decrease adhesive wear time. Push the button when you have any symptoms and write down what you were feeling. Once you have completed wearing your monitor, remove and place into box which has postage paid and place in your outgoing mailbox.  If for some reason you have misplaced your box then call our office and we can provide another box and/or mail it off for you.  Follow-Up: At [facility], you and your health needs are our priority.  As part of our continuing mission to provide you with exceptional heart care, we have created designated Provider Care Teams.  These Care Teams include your primary Cardiologist (physician) and Advanced Practice Providers (APPs -  Physician Assistants and Nurse Practitioners) who all work together to provide you with the care you need, when you need it.  We recommend signing up for the patient portal called "MyChart".  Sign up information is provided on this After Visit Summary.  MyChart is used to connect with patients for Virtual Visits (Telemedicine).  Patients are able to view lab/test results, encounter notes, upcoming appointments, etc.  Non-urgent messages can be sent to your provider as well.   To learn more about what you can do with MyChart, go to [email or web address].    Your next appointment:   2-3 month(s)  Provider:   Chilton Si, MD or [facility]  Walker, NP    Other Instructions  To prevent or reduce lower extremity swelling: Eat a low salt diet. Salt makes the body hold onto extra fluid which causes swelling. Sit with legs elevated. For example, in the recliner or on an ottoman.  Wear knee-high compression stockings during the daytime. Ones labeled 15-20 mmHg provide good compression.   Heart Healthy Diet Recommendations: A low-salt diet is recommended. Meats should be grilled, baked, or  boiled. Avoid fried foods. Focus on lean protein sources like fish or chicken with vegetables and fruits. The American Heart Association is a Chief Technology Officer!  American Heart Association Diet and Lifeystyle Recommendations   Recommend restricting to less than 64 oz (2 liters) of fluid per day   Recommend weighing daily and keeping a log. Please call our office if you have weight gain of 2 pounds overnight or 5 pounds in 1 week.   Date  Time Weight

## 2022-09-17 LAB — BASIC METABOLIC PANEL
BUN/Creatinine Ratio: 20 (ref 12–28)
BUN: 38 mg/dL — ABNORMAL HIGH (ref 10–36)
CO2: 27 mmol/L (ref 20–29)
Calcium: 9.9 mg/dL (ref 8.7–10.3)
Chloride: 98 mmol/L (ref 96–106)
Creatinine, Ser: 1.88 mg/dL — ABNORMAL HIGH (ref 0.57–1.00)
Glucose: 110 mg/dL — ABNORMAL HIGH (ref 70–99)
Potassium: 5.6 mmol/L — ABNORMAL HIGH (ref 3.5–5.2)
Sodium: 145 mmol/L — ABNORMAL HIGH (ref 134–144)
eGFR: 24 mL/min/{1.73_m2} — ABNORMAL LOW (ref >59)

## 2022-09-17 LAB — MAGNESIUM: Magnesium: 1.8 mg/dL (ref 1.6–2.3)

## 2022-09-20 ENCOUNTER — Telehealth (HOSPITAL_BASED_OUTPATIENT_CLINIC_OR_DEPARTMENT_OTHER): Payer: Self-pay

## 2022-09-20 ENCOUNTER — Ambulatory Visit: Payer: Medicare Other | Attending: Family

## 2022-09-20 DIAGNOSIS — I10 Essential (primary) hypertension: Secondary | ICD-10-CM

## 2022-09-20 DIAGNOSIS — I4819 Other persistent atrial fibrillation: Secondary | ICD-10-CM

## 2022-09-20 DIAGNOSIS — D6859 Other primary thrombophilia: Secondary | ICD-10-CM | POA: Diagnosis not present

## 2022-09-20 NOTE — Telephone Encounter (Addendum)
Consulted with NP about lasix, we will leave Lasix as changed at last office visit as daily PRN. Still have labs repeated on Thursday or Friday!   Called patient and reviewed the labs with the patient. She is agreeable to repeat labs later this week!   ----- Message from Alver Sorrow, NP sent at 09/19/2022  7:34 PM EDT ----- Normal magnesium. Kidney function decreased from prior. Potassium mildly elevated.    Ensure avoiding potassium supplement, salt substitute, electrolyte drinks.   Repeat BMP Thursday or Friday for monitoring of renal function and potassium.

## 2022-09-20 NOTE — Progress Notes (Unsigned)
Enrolled patient for a 14 day Zio XT monitor to be mailed to patients home   Dr Phelps to read 

## 2022-09-21 NOTE — Therapy (Signed)
OUTPATIENT PHYSICAL THERAPY VESTIBULAR EVALUATION     Patient Name: [name] [name] MRN: [id number] DOB:Nov 13, 1924, 89 y.o., female Today's Date: 09/22/2022  END OF SESSION:  PT End of Session - 09/22/22 1246     Visit Number 1    Number of Visits 9    Date for PT Re-Evaluation 10/22/22    Authorization Type Medicare    PT Start Time 0933    PT Stop Time 1022    PT Time Calculation (min) 49 min    Equipment Utilized During Treatment Gait belt    Activity Tolerance Patient tolerated treatment well    Behavior During Therapy WFL for tasks assessed/performed             Past Medical History:  Diagnosis Date   Acute on chronic diastolic heart failure (HCC) 12/30/2020   Allergic rhinoconjunctivitis    Allergy    Anxiety    pt. managed- uses deep breathing    Arthritis    low back , stenosis   COLONIC POLYPS, RECURRENT 08/29/2006   2008 last colonoscopy. No further colonoscopy.      COPD (chronic obstructive pulmonary disease) (HCC)    Diverticulosis    Dysrhythmia    afib   Eczema    Environmental allergies    allergy shot- q friday in Dr. Roxy Cedar office. PFT's abnormal- recommended Spiriva to use preop & will d/c after surgery   GERD (gastroesophageal reflux disease)    Hiatal hernia    Hyperlipidemia    Hypertension    Lung nodule 2011   Paroxysmal atrial fibrillation (HCC) 11/27/2019   Peripheral vascular disease (HCC)    Shingles    Stenosis of popliteal artery (HCC)    blood clots in legs long ago     Trigeminal neuralgia    Left buttocks   Past Surgical History:  Procedure Laterality Date   ABDOMINAL AORTAGRAM N/A 06/17/2011   Procedure: ABDOMINAL Ronny Flurry;  Surgeon: Sherren Kerns, MD;  Location: [facility] CATH LAB;  Service: Cardiovascular;  Laterality: N/A;   ABDOMINAL AORTOGRAM W/LOWER EXTREMITY Bilateral 06/22/2018   Procedure: ABDOMINAL AORTOGRAM W/LOWER EXTREMITY;  Surgeon: Sherren Kerns, MD;  Location: [facility] INVASIVE CV LAB;  Service: Cardiovascular;   Laterality: Bilateral;   ABDOMINAL AORTOGRAM W/LOWER EXTREMITY Bilateral 11/29/2019   Procedure: ABDOMINAL AORTOGRAM W/LOWER EXTREMITY;  Surgeon: Sherren Kerns, MD;  Location: [facility] INVASIVE CV LAB;  Service: Cardiovascular;  Laterality: Bilateral;   CATARACT EXTRACTION, BILATERAL     w IOL   CHOLECYSTECTOMY  1998   COLONOSCOPY     ENDARTERECTOMY FEMORAL Left 12/09/2019   Procedure: ENDARTERECTOMY FEMORAL with bovine patch angioplasty.;  Surgeon: Sherren Kerns, MD;  Location: [facility] OR;  Service: Vascular;  Laterality: Left;   FEMORAL-POPLITEAL BYPASS GRAFT        x2 surgeries 1990's & 2009   FEMORAL-POPLITEAL BYPASS GRAFT Right 12/09/2019   Procedure: REDO RIGHT FEMORAL-POPLITEAL ARTERY BYPASS GRAFT;  Surgeon: Sherren Kerns, MD;  Location: [facility] OR;  Service: Vascular;  Laterality: Right;   INJECTION KNEE Right Aug. 2016   Gel injection for pain   LUMBAR FUSION  07/06/2011   TONSILLECTOMY     as a teenager    TUBAL LIGATION     Patient Active Problem List   Diagnosis Date Noted   Vertigo 05/04/2022   Mild sleep apnea 05/03/2021   Chronic diastolic heart failure (HCC) 12/30/2020   Left groin hernia 05/22/2020   Anxiety state    Rectal bleeding    Ischemic  colitis (HCC)    Status post femoral-popliteal bypass surgery 12/09/2019   Persistent atrial fibrillation (HCC) 12/03/2019   Secondary hypercoagulable state (HCC) 12/03/2019   Chronic bilateral low back pain with right-sided sciatica 08/29/2019   Gross hematuria 06/01/2018   CKD (chronic kidney disease), stage III (HCC) 08/07/2017   Unilateral primary osteoarthritis, right knee 04/07/2016   Acute gout of left foot 03/22/2016   Pain in right ankle and joints of right foot 08/28/2014   Trigeminal neuralgia    Syncope 12/29/2011   Chronic low back pain 09/13/2010   Allergic rhinitis due to pollen 06/25/2010   COPD mixed type (HCC) 01/22/2010   Solitary pulmonary nodule 01/12/2010   Insulin resistance 01/06/2009   Osteopenia  01/06/2009   Eczema 11/26/2007   Peripheral arterial disease (HCC) 06/29/2007   Hyperlipidemia 10/23/2006   Essential hypertension 10/23/2006   ESOPHAGEAL STRICTURE 08/09/1993    PCP: Shelva Majestic, MD  REFERRING PROVIDER:  Shelva Majestic, MD  REFERRING DIAG: R42 (ICD-10-CM) - Vertigo   THERAPY DIAG:  BPPV (benign paroxysmal positional vertigo), left  Unsteadiness on feet  Dizziness and giddiness  ONSET DATE: 08/05/2022  Rationale for Evaluation and Treatment: Rehabilitation  SUBJECTIVE:   SUBJECTIVE STATEMENT: Was hospitalized in Jan 2024 for dizziness and was found to have BPPV. Then went to a nursing home and was discharged back home. Also got home health PT in Feb/March. Reports now that the dizziness is back and started about 2-3 weeks ago. Has trouble focusing her eyes. Dizziness will come and go. Yesterday morning felt good. Has a funny feeling that her head is not right. Gets a little nauseated. Never knows until she gets out of bed in the morning if she is going to have a spell. Went down on her knees last week - all of a sudden her knees went out. Has been using RW off and on for years. Prior to dizziness episode, was using a cane when away from the house and no AD in the house. No headaches. Taking 1/2 pill of Meclizine 3x a day. Has not taken it yet today as she does make her dizzy.  Pt accompanied by: self  PERTINENT HISTORY: PMH:  PAD, COPD, DVT, HTN, A fib, Gout, trigeminal neuralgia, post herpetic neuralgia, OA of right knee with injections, hx of lumbar fusion, chronic diastolic heart failure  Per PCP on 08/05/22: :she states symptoms started back in January before hospitalization. Even sitting forward in chair might get sensation of entire head still moving when she stops and sensation that head is too heavy. Turning head quickly can cause it and loses focus. Can even happen with turning over bed- has based on be very careful with head movement  May have  better days and then worse days- hard to make plans as a result   Pt hospitalized Jan 2024 with syncope/dizziness, per PT note was found to have vestibular hypofunction and L posterior canal and horizontal canal BPPV.   PAIN:  Are you having pain? No  PRECAUTIONS: None  WEIGHT BEARING RESTRICTIONS: No  FALLS: Has patient fallen in last 6 months? Yes. Number of falls 1  LIVING ENVIRONMENT: Lives with: lives alone, son lives close by and is over almost everyday, helps with grocery shopping  Lives in: Other Condo Stairs: No Has following equipment at home: Single point cane, Environmental consultant - 2 wheeled, Environmental consultant - 4 wheeled, shower chair, and Grab bars  PLOF: Independent with household mobility with device and Independent with community mobility with  device Drives according to the way she feels.   PATIENT GOALS: Wants to be able to walk and get out and do things, walk longer distances. Wants to be more independent   OBJECTIVE:   DIAGNOSTIC FINDINGS: MRI brain 04/2022:  IMPRESSION: Normal brain MRI for age.  No acute intracranial abnormality.  COGNITION: Overall cognitive status: Within functional limits for tasks assessed   POSTURE:  rounded shoulders  GAIT: Gait pattern: step through pattern and decreased stride length Distance walked: Clinic distances  Assistive device utilized: Environmental consultant - 2 wheeled Level of assistance: SBA   PATIENT SURVEYS:  FOTO DPS: 46, DFS: 39.7  VESTIBULAR ASSESSMENT:  GENERAL OBSERVATION: Ambulates in with RW    SYMPTOM BEHAVIOR:  Subjective history: See above   Non-Vestibular symptoms: changes in vision and nausea/vomiting  Type of dizziness: Imbalance (Disequilibrium), Spinning/Vertigo, and "feels like she is not quite all there", feels like brain is foggy, head doesn't belong to her body  Frequency: Every other day   Duration: Sometimes a short amount of time, sometimes off and on all day   Aggravating factors: Induced by motion: looking up at the  ceiling  Relieving factors:  sitting down and being quiet   Progression of symptoms:  happening more frequently   OCULOMOTOR EXAM:  Ocular Alignment: normal  Ocular ROM: No Limitations  Spontaneous Nystagmus: absent  Gaze-Induced Nystagmus: absent  Smooth Pursuits: intact and reports feeling sleepy  Saccades: intact and going superiorly and in horizontal directions, reporting feeling dizzy   VESTIBULAR - OCULAR REFLEX:   Slow VOR: Normal and Comment: mild dizziness   VOR Cancellation: Comment: mild/mod dizziness, feels like head is floating back and forth   Head-Impulse Test: HIT Right: positive HIT Left: positive  Reports some nausea afterwards   Dynamic Visual Acuity: Not yet assessed.    POSITIONAL TESTING: Right Dix-Hallpike: no nystagmus and pt reporting feeling dizzy and off  Left Dix-Hallpike: upbeating, left nystagmus and lasting approx. 10-15 seconds    VESTIBULAR TREATMENT:                                                                                                   DATE: 09/22/22  Canalith Repositioning:  Epley Left: Number of Reps: 1, Response to Treatment: symptoms resolved, and Comment: pt reporting improved symptoms afterwards, performed with pillow under back. Did not get to re-assess at end of session due to time constraints. Pt reporting dizziness in all 3 positions    PATIENT EDUCATION: Education details: Clinical findings, POC, etiology of BPPV and treatment via Epley.  Person educated: Patient Education method: Explanation, Demonstration, and Handouts Education comprehension: verbalized understanding and returned demonstration  HOME EXERCISE PROGRAM: Will provide at future session  GOALS: Goals reviewed with patient? Yes  SHORT TERM GOALS: ALL STGS = LTGS  LONG TERM GOALS: Target date: 10/20/2022  Pt will be independent with final HEP for vestibular deficits in order to build upon functional gains made in therapy. Baseline:  Goal status:  INITIAL  2.  Pt will improve DPS to at least 59 in order to demo improved functional outcomes related  to dizziness.   Baseline: 46 Goal status: INITIAL  3.  Pt will demo negative positional testing in order to demo resolution of L BPPV.  Baseline: L posterior canal BPPV Goal status: INITIAL  4.  DVA to be assessed with goal written as appropriate.  Baseline:  Goal status: INITIAL    ASSESSMENT:  CLINICAL IMPRESSION: Patient is a 89 year old female referred to Neuro OPPT for Vertigo.   Pt's PMH is significant for: PAD, COPD, DVT, HTN, A fib, Gout, trigeminal neuralgia, post herpetic neuralgia, OA of right knee with injections, hx of lumbar fusion, chronic diastolic heart failure. The following deficits were present during the exam: positive HIT bilaterally (pt more symptomatic when going to the R), dizziness with saccades, and L upbeating rotary nystagmus lasting approx 10-15 seconds, indicating L posterior canal BPPV. Treated with x1 rep of the Epley with pt reporting feeling better afterwards. Did not get to re-assess due to time constraints. Pt would benefit from skilled PT to address these impairments and functional limitations to maximize functional mobility independence and decr dizziness.    OBJECTIVE IMPAIRMENTS: Abnormal gait, decreased activity tolerance, decreased balance, difficulty walking, dizziness, and postural dysfunction.   ACTIVITY LIMITATIONS: bending, bed mobility, reach over head, and locomotion level  PARTICIPATION LIMITATIONS: meal prep, driving, shopping, and community activity  PERSONAL FACTORS: Age, Past/current experiences, Time since onset of injury/illness/exacerbation, and 3+ comorbidities: PAD, COPD, DVT, HTN, A fib, Gout, trigeminal neuralgia, post herpetic neuralgia, OA of right knee with injections, hx of lumbar fusion, chronic diastolic heart failure  are also affecting patient's functional outcome.   REHAB POTENTIAL: Good  CLINICAL DECISION MAKING:  Evolving/moderate complexity  EVALUATION COMPLEXITY: Moderate   PLAN:  PT FREQUENCY: 2x/week  PT DURATION: 4 weeks  PLANNED INTERVENTIONS: Therapeutic exercises, Therapeutic activity, Neuromuscular re-education, Balance training, Gait training, Patient/Family education, Self Care, Vestibular training, Canalith repositioning, DME instructions, and Re-evaluation  PLAN FOR NEXT SESSION: Re-assess L posterior canal BPPV and other canals. Treat with Epley. HEP for VOR as needed    Drake Leach, PT, DPT  09/22/2022, 12:52 PM

## 2022-09-22 ENCOUNTER — Encounter: Payer: Self-pay | Admitting: Physical Therapy

## 2022-09-22 ENCOUNTER — Telehealth: Payer: Self-pay | Admitting: Family Medicine

## 2022-09-22 ENCOUNTER — Telehealth: Payer: Self-pay | Admitting: Orthopaedic Surgery

## 2022-09-22 ENCOUNTER — Other Ambulatory Visit: Payer: Self-pay

## 2022-09-22 ENCOUNTER — Ambulatory Visit: Payer: Medicare Other | Attending: Physical Medicine and Rehabilitation | Admitting: Physical Therapy

## 2022-09-22 VITALS — BP 118/65

## 2022-09-22 DIAGNOSIS — R42 Dizziness and giddiness: Secondary | ICD-10-CM

## 2022-09-22 DIAGNOSIS — H8112 Benign paroxysmal vertigo, left ear: Secondary | ICD-10-CM | POA: Insufficient documentation

## 2022-09-22 DIAGNOSIS — M1711 Unilateral primary osteoarthritis, right knee: Secondary | ICD-10-CM

## 2022-09-22 DIAGNOSIS — R2681 Unsteadiness on feet: Secondary | ICD-10-CM | POA: Insufficient documentation

## 2022-09-22 NOTE — Telephone Encounter (Signed)
Referral placed with [facility] requested in referral comments.

## 2022-09-22 NOTE — Telephone Encounter (Signed)
Caller states pt had vestibular evaluation [facility] who informed her that her ear crystals have shifted again. Caller states they informed pt she would need to come in for treatment twice a week for 2 1/2-3 weeks.   Caller is requesting her treatment be changed from [facility] neuro rehab to [facility] health. Caller states their vestibular therapist was really good and it's a easier/safer option for pt. Please Advise.

## 2022-09-22 NOTE — Telephone Encounter (Signed)
thanks

## 2022-09-22 NOTE — Telephone Encounter (Signed)
No Problem :) 

## 2022-09-22 NOTE — Telephone Encounter (Signed)
Patient asking to get a gel injection in her knee. Please advise

## 2022-09-22 NOTE — Telephone Encounter (Signed)
Previously submitted. Talked with patient and appointment has been scheduled for gel injection.

## 2022-09-23 ENCOUNTER — Ambulatory Visit: Payer: Medicare Other | Admitting: Physician Assistant

## 2022-09-26 ENCOUNTER — Ambulatory Visit: Payer: Medicare Other | Attending: Physical Medicine and Rehabilitation | Admitting: Physical Therapy

## 2022-09-26 ENCOUNTER — Encounter: Payer: Self-pay | Admitting: Physical Therapy

## 2022-09-26 ENCOUNTER — Other Ambulatory Visit: Payer: Self-pay

## 2022-09-26 DIAGNOSIS — H8112 Benign paroxysmal vertigo, left ear: Secondary | ICD-10-CM | POA: Diagnosis not present

## 2022-09-26 DIAGNOSIS — R2681 Unsteadiness on feet: Secondary | ICD-10-CM | POA: Diagnosis not present

## 2022-09-26 DIAGNOSIS — R42 Dizziness and giddiness: Secondary | ICD-10-CM | POA: Diagnosis not present

## 2022-09-26 NOTE — Therapy (Signed)
OUTPATIENT PHYSICAL THERAPY VESTIBULAR TREATMENT      Patient Name: [name] [name] MRN: [id number] DOB:10/16/1924, 89 y.o., female Today's Date: 09/26/2022  END OF SESSION:  PT End of Session - 09/26/22 1022     Visit Number 2    Number of Visits 9    Date for PT Re-Evaluation 10/22/22    Authorization Type Medicare    PT Start Time 1020   pt late to session   PT Stop Time 1105    PT Time Calculation (min) 45 min    Equipment Utilized During Treatment --    Activity Tolerance Patient tolerated treatment well   dizziness/nausea   Behavior During Therapy WFL for tasks assessed/performed             Past Medical History:  Diagnosis Date   Acute on chronic diastolic heart failure (HCC) 12/30/2020   Allergic rhinoconjunctivitis    Allergy    Anxiety    pt. managed- uses deep breathing    Arthritis    low back , stenosis   COLONIC POLYPS, RECURRENT 08/29/2006   2008 last colonoscopy. No further colonoscopy.      COPD (chronic obstructive pulmonary disease) (HCC)    Diverticulosis    Dysrhythmia    afib   Eczema    Environmental allergies    allergy shot- q friday in Dr. Roxy Cedar office. PFT's abnormal- recommended Spiriva to use preop & will d/c after surgery   GERD (gastroesophageal reflux disease)    Hiatal hernia    Hyperlipidemia    Hypertension    Lung nodule 2011   Paroxysmal atrial fibrillation (HCC) 11/27/2019   Peripheral vascular disease (HCC)    Shingles    Stenosis of popliteal artery (HCC)    blood clots in legs long ago     Trigeminal neuralgia    Left buttocks   Past Surgical History:  Procedure Laterality Date   ABDOMINAL AORTAGRAM N/A 06/17/2011   Procedure: ABDOMINAL Ronny Flurry;  Surgeon: Sherren Kerns, MD;  Location: [facility] CATH LAB;  Service: Cardiovascular;  Laterality: N/A;   ABDOMINAL AORTOGRAM W/LOWER EXTREMITY Bilateral 06/22/2018   Procedure: ABDOMINAL AORTOGRAM W/LOWER EXTREMITY;  Surgeon: Sherren Kerns, MD;  Location: [facility] INVASIVE CV  LAB;  Service: Cardiovascular;  Laterality: Bilateral;   ABDOMINAL AORTOGRAM W/LOWER EXTREMITY Bilateral 11/29/2019   Procedure: ABDOMINAL AORTOGRAM W/LOWER EXTREMITY;  Surgeon: Sherren Kerns, MD;  Location: [facility] INVASIVE CV LAB;  Service: Cardiovascular;  Laterality: Bilateral;   CATARACT EXTRACTION, BILATERAL     w IOL   CHOLECYSTECTOMY  1998   COLONOSCOPY     ENDARTERECTOMY FEMORAL Left 12/09/2019   Procedure: ENDARTERECTOMY FEMORAL with bovine patch angioplasty.;  Surgeon: Sherren Kerns, MD;  Location: [facility] OR;  Service: Vascular;  Laterality: Left;   FEMORAL-POPLITEAL BYPASS GRAFT        x2 surgeries 1990's & 2009   FEMORAL-POPLITEAL BYPASS GRAFT Right 12/09/2019   Procedure: REDO RIGHT FEMORAL-POPLITEAL ARTERY BYPASS GRAFT;  Surgeon: Sherren Kerns, MD;  Location: [facility] OR;  Service: Vascular;  Laterality: Right;   INJECTION KNEE Right Aug. 2016   Gel injection for pain   LUMBAR FUSION  07/06/2011   TONSILLECTOMY     as a teenager    TUBAL LIGATION     Patient Active Problem List   Diagnosis Date Noted   Vertigo 05/04/2022   Mild sleep apnea 05/03/2021   Chronic diastolic heart failure (HCC) 12/30/2020   Left groin hernia 05/22/2020   Anxiety state  Rectal bleeding    Ischemic colitis (HCC)    Status post femoral-popliteal bypass surgery 12/09/2019   Persistent atrial fibrillation (HCC) 12/03/2019   Secondary hypercoagulable state (HCC) 12/03/2019   Chronic bilateral low back pain with right-sided sciatica 08/29/2019   Gross hematuria 06/01/2018   CKD (chronic kidney disease), stage III (HCC) 08/07/2017   Unilateral primary osteoarthritis, right knee 04/07/2016   Acute gout of left foot 03/22/2016   Pain in right ankle and joints of right foot 08/28/2014   Trigeminal neuralgia    Syncope 12/29/2011   Chronic low back pain 09/13/2010   Allergic rhinitis due to pollen 06/25/2010   COPD mixed type (HCC) 01/22/2010   Solitary pulmonary nodule 01/12/2010   Insulin  resistance 01/06/2009   Osteopenia 01/06/2009   Eczema 11/26/2007   Peripheral arterial disease (HCC) 06/29/2007   Hyperlipidemia 10/23/2006   Essential hypertension 10/23/2006   ESOPHAGEAL STRICTURE 08/09/1993    PCP: Shelva Majestic, MD  REFERRING PROVIDER:  Shelva Majestic, MD  REFERRING DIAG: R42 (ICD-10-CM) - Vertigo   THERAPY DIAG:  BPPV (benign paroxysmal positional vertigo), left  Unsteadiness on feet  Dizziness and giddiness  ONSET DATE: 08/05/2022  Rationale for Evaluation and Treatment: Rehabilitation  SUBJECTIVE:   SUBJECTIVE STATEMENT: Feeling a little fuzzy this morning. Overall dizziness is doing much better. Also reports improvements in her vision. Had a good weekend    Pt accompanied by: self  PERTINENT HISTORY: PMH:  PAD, COPD, DVT, HTN, A fib, Gout, trigeminal neuralgia, post herpetic neuralgia, OA of right knee with injections, hx of lumbar fusion, chronic diastolic heart failure  Per PCP on 08/05/22: :she states symptoms started back in January before hospitalization. Even sitting forward in chair might get sensation of entire head still moving when she stops and sensation that head is too heavy. Turning head quickly can cause it and loses focus. Can even happen with turning over bed- has based on be very careful with head movement  May have better days and then worse days- hard to make plans as a result   Pt hospitalized Jan 2024 with syncope/dizziness, per PT note was found to have vestibular hypofunction and L posterior canal and horizontal canal BPPV.   PAIN:  Are you having pain? No  PRECAUTIONS: None  WEIGHT BEARING RESTRICTIONS: No  FALLS: Has patient fallen in last 6 months? Yes. Number of falls 1  LIVING ENVIRONMENT: Lives with: lives alone, son lives close by and is over almost everyday, helps with grocery shopping  Lives in: Other Condo Stairs: No Has following equipment at home: Single point cane, Environmental consultant - 2 wheeled, Environmental consultant - 4  wheeled, shower chair, and Grab bars  PLOF: Independent with household mobility with device and Independent with community mobility with device Drives according to the way she feels.   PATIENT GOALS: Wants to be able to walk and get out and do things, walk longer distances. Wants to be more independent   OBJECTIVE:   DIAGNOSTIC FINDINGS: MRI brain 04/2022:  IMPRESSION: Normal brain MRI for age.  No acute intracranial abnormality.  COGNITION: Overall cognitive status: Within functional limits for tasks assessed GAIT: Gait pattern: step through pattern and decreased stride length Distance walked: Clinic distances  Assistive device utilized: Walker - 2 wheeled Level of assistance: SBA  VESTIBULAR TREATMENT:  DATE: 09/26/22   POSITIONAL TESTING: Right Dix-Hallpike: no nystagmus and no dizziness Left Dix-Hallpike: upbeating, left nystagmus and lasting approx. 10 seconds. With re-assessment after Epley, pt with no nystagmus or dizziness  Right Roll Test: no nystagmus and only turned pt's head  Left Roll Test: geotropic nystagmus, symptoms worse on left side, and saw when pt rolled entire body vs. Just head, pt with geotropic nystagmus lasting approx. 50- 60 seconds  Right Sidelying: no nystagmus Left Sidelying: pt demonstrating geotropic nystagmus lasting 50-60 seconds, pt needing to close eyes due to dizziness    Canalith Repositioning:  Epley Left: Number of Reps: 1, Response to Treatment: symptoms resolved, and Comment: pt reporting improved symptoms afterwards, performed with pillow under back. Pt with incr dizziness in position 3. Upon re-assessment in L DixHallpike afterwards, pt with no nystagmus/dizziness     and Appiani/Gufoni Horizontal Geotropic: Number of Reps: 1, Response to Treatment: symptoms improved, and Comment: performed for L horizontal canalithiasis (sidelying on R side).  Pt with improvement in symptoms at end of session, but did not get to re-assess due to time constraints.   Soda (did not have ginger ale available) provided after Epley and Gufoni due to pt feeling nausea. Pt reporting feeling better with seated rest break.   Pt able to ambulate out of session with RW with supervision and feeling better.   PATIENT EDUCATION: Education details: Clinical findings, BPPV education regarding posterior canal and L horizontal canal (discussed could have converted or could have been there all along and was just found today with assessment as pt also had horizontal canal in the hospital), treatment for BPPV with maneuvers, performing repeated rolling tomorrow to help with horizontal canal BPPV. Pt has referral to home health (as it can be hard to get pt to appts), discussed insurance won't cover home health and OPPT at the same time, pt to keep her next appt with OPPT before possibly transitioning to Home Health.  Person educated: Patient and Child(ren) (also educated pt's son had end of session) Education method: Explanation, Demonstration, and Handouts Education comprehension: verbalized understanding and returned demonstration  HOME EXERCISE PROGRAM: Repeated rolling for horizontal canal BPPV   GOALS: Goals reviewed with patient? Yes  SHORT TERM GOALS: ALL STGS = LTGS  LONG TERM GOALS: Target date: 10/20/2022  Pt will be independent with final HEP for vestibular deficits in order to build upon functional gains made in therapy. Baseline:  Goal status: INITIAL  2.  Pt will improve DPS to at least 59 in order to demo improved functional outcomes related to dizziness.   Baseline: 46 Goal status: INITIAL  3.  Pt will demo negative positional testing in order to demo resolution of L BPPV.  Baseline: L posterior canal BPPV Goal status: INITIAL  4.  DVA to be assessed with goal written as appropriate.  Baseline:  Goal status:  INITIAL    ASSESSMENT:  CLINICAL IMPRESSION: Pt reporting feeling better over the weekend, but feeling a little fuzzy this morning. Pt demonstrating upbeating L rotary nystagmus in L DixHallpike, lasting approx. 10 seconds, it was intense but very brief. Treated x1 rep of Epley maneuver and pt demonstrating resolution with re-assessment. When assessing all canals, pt demonstrating geotropic nystagmus in L sidelying and L roll test position lasting approx. 50-60 seconds, indicating L horizontal canalithiasis. Treated x1 rep of Gufoni maneuver. Did not get to re-assess at end of session due to time constraints, but pt reporting feeling better when leaving session. Educated on multi-canal BPPV  and instructed on repeated rolling exercise for home. Pt will continue to benefit from skilled PT to address BPPV in order to decr dizziness and fall risk. Will continue to progress towards LTGs.    OBJECTIVE IMPAIRMENTS: Abnormal gait, decreased activity tolerance, decreased balance, difficulty walking, dizziness, and postural dysfunction.   ACTIVITY LIMITATIONS: bending, bed mobility, reach over head, and locomotion level  PARTICIPATION LIMITATIONS: meal prep, driving, shopping, and community activity  PERSONAL FACTORS: Age, Past/current experiences, Time since onset of injury/illness/exacerbation, and 3+ comorbidities: PAD, COPD, DVT, HTN, A fib, Gout, trigeminal neuralgia, post herpetic neuralgia, OA of right knee with injections, hx of lumbar fusion, chronic diastolic heart failure  are also affecting patient's functional outcome.   REHAB POTENTIAL: Good  CLINICAL DECISION MAKING: Evolving/moderate complexity  EVALUATION COMPLEXITY: Moderate   PLAN:  PT FREQUENCY: 2x/week  PT DURATION: 4 weeks  PLANNED INTERVENTIONS: Therapeutic exercises, Therapeutic activity, Neuromuscular re-education, Balance training, Gait training, Patient/Family education, Self Care, Vestibular training, Canalith  repositioning, DME instructions, and Re-evaluation  PLAN FOR NEXT SESSION: Re-assess L posterior canal BPPV and horizontal canal. HEP for VOR as needed    Drake Leach, PT, DPT  09/26/2022, 12:19 PM

## 2022-09-27 ENCOUNTER — Ambulatory Visit (INDEPENDENT_AMBULATORY_CARE_PROVIDER_SITE_OTHER): Payer: Medicare Other | Admitting: Orthopaedic Surgery

## 2022-09-27 DIAGNOSIS — M1711 Unilateral primary osteoarthritis, right knee: Secondary | ICD-10-CM

## 2022-09-27 MED ORDER — HYALURONAN 88 MG/4ML IX SOSY
88.0000 mg | PREFILLED_SYRINGE | INTRA_ARTICULAR | Status: AC | PRN
  Administered 2022-09-27: 88 mg via INTRA_ARTICULAR

## 2022-09-27 NOTE — Progress Notes (Signed)
Office Visit Note   Patient: [name] [name]           Date of Birth: 08/26/1924           MRN: [id number] Visit Date: 09/27/2022              Requested by: Shelva Majestic, MD [address] [place],  [place] [address] PCP: Shelva Majestic, MD   Assessment & Plan: Visit Diagnoses:  1. Unilateral primary osteoarthritis, right knee     Plan: Impression is right knee osteoarthritis.  Today, proceed with right knee Monovisc injection.  She tolerated this well.  She will follow-up with [place] as needed. Expiration date 02/14/2025 Lot #[id number]   Follow-Up Instructions: Return if symptoms worsen or fail to improve.   Orders:  No orders of the defined types were placed in this encounter.  No orders of the defined types were placed in this encounter.     Procedures: Large Joint Inj: R knee on 09/27/2022 8:24 PM Indications: pain Details: 22 G needle  Arthrogram: No  Medications: 88 mg Hyaluronan 88 MG/4ML Outcome: tolerated well, no immediate complications Patient was prepped and draped in the usual sterile fashion.       Clinical Data: No additional findings.   Subjective: Chief Complaint  Patient presents with   Right Knee - Follow-up    HPI patient is a pleasant 89 year old female who comes in today with right knee osteoarthritis.  She is here for right knee Monovisc injection.  She has had viscosupplementation injections in the past with good relief.  Last injection was Monovisc which was on 02/08/2022.  Review of Systems as detailed in HPI.  All others reviewed and are negative.   Objective: Vital Signs: There were no vitals taken for this visit.  Physical Exam well-developed well-nourished female in no acute distress.  Alert and oriented x 3.  Ortho Exam stable right knee exam  Specialty Comments:  No specialty comments available.  Imaging: No new imaging   PMFS History: Patient Active Problem List   Diagnosis Date Noted   Vertigo  05/04/2022   Mild sleep apnea 05/03/2021   Chronic diastolic heart failure (HCC) 12/30/2020   Left groin hernia 05/22/2020   Anxiety state    Rectal bleeding    Ischemic colitis (HCC)    Status post femoral-popliteal bypass surgery 12/09/2019   Persistent atrial fibrillation (HCC) 12/03/2019   Secondary hypercoagulable state (HCC) 12/03/2019   Chronic bilateral low back pain with right-sided sciatica 08/29/2019   Gross hematuria 06/01/2018   CKD (chronic kidney disease), stage III (HCC) 08/07/2017   Unilateral primary osteoarthritis, right knee 04/07/2016   Acute gout of left foot 03/22/2016   Pain in right ankle and joints of right foot 08/28/2014   Trigeminal neuralgia    Syncope 12/29/2011   Chronic low back pain 09/13/2010   Allergic rhinitis due to pollen 06/25/2010   COPD mixed type (HCC) 01/22/2010   Solitary pulmonary nodule 01/12/2010   Insulin resistance 01/06/2009   Osteopenia 01/06/2009   Eczema 11/26/2007   Peripheral arterial disease (HCC) 06/29/2007   Hyperlipidemia 10/23/2006   Essential hypertension 10/23/2006   ESOPHAGEAL STRICTURE 08/09/1993   Past Medical History:  Diagnosis Date   Acute on chronic diastolic heart failure (HCC) 12/30/2020   Allergic rhinoconjunctivitis    Allergy    Anxiety    pt. managed- uses deep breathing    Arthritis    low back , stenosis   COLONIC POLYPS, RECURRENT 08/29/2006  2008 last colonoscopy. No further colonoscopy.      COPD (chronic obstructive pulmonary disease) (HCC)    Diverticulosis    Dysrhythmia    afib   Eczema    Environmental allergies    allergy shot- q friday in Dr. Roxy Cedar office. PFT's abnormal- recommended Spiriva to use preop & will d/c after surgery   GERD (gastroesophageal reflux disease)    Hiatal hernia    Hyperlipidemia    Hypertension    Lung nodule 2011   Paroxysmal atrial fibrillation (HCC) 11/27/2019   Peripheral vascular disease (HCC)    Shingles    Stenosis of popliteal artery (HCC)     blood clots in legs long ago     Trigeminal neuralgia    Left buttocks    Family History  Problem Relation Age of Onset   Arthritis Mother    Hypertension Mother    Heart disease Father    Colon polyps Father    Breast cancer Sister    Breast cancer Daughter    Hypertension Son    Lung cancer Brother    Colon cancer Sister    Brain cancer Sister    Lung cancer Sister    Anesthesia problems Neg Hx    Hypotension Neg Hx    Malignant hyperthermia Neg Hx    Pseudochol deficiency Neg Hx     Past Surgical History:  Procedure Laterality Date   ABDOMINAL AORTAGRAM N/A 06/17/2011   Procedure: ABDOMINAL Ronny Flurry;  Surgeon: Sherren Kerns, MD;  Location: [facility] CATH LAB;  Service: Cardiovascular;  Laterality: N/A;   ABDOMINAL AORTOGRAM W/LOWER EXTREMITY Bilateral 06/22/2018   Procedure: ABDOMINAL AORTOGRAM W/LOWER EXTREMITY;  Surgeon: Sherren Kerns, MD;  Location: [facility] INVASIVE CV LAB;  Service: Cardiovascular;  Laterality: Bilateral;   ABDOMINAL AORTOGRAM W/LOWER EXTREMITY Bilateral 11/29/2019   Procedure: ABDOMINAL AORTOGRAM W/LOWER EXTREMITY;  Surgeon: Sherren Kerns, MD;  Location: [facility] INVASIVE CV LAB;  Service: Cardiovascular;  Laterality: Bilateral;   CATARACT EXTRACTION, BILATERAL     w IOL   CHOLECYSTECTOMY  1998   COLONOSCOPY     ENDARTERECTOMY FEMORAL Left 12/09/2019   Procedure: ENDARTERECTOMY FEMORAL with bovine patch angioplasty.;  Surgeon: Sherren Kerns, MD;  Location: [facility] OR;  Service: Vascular;  Laterality: Left;   FEMORAL-POPLITEAL BYPASS GRAFT        x2 surgeries 1990's & 2009   FEMORAL-POPLITEAL BYPASS GRAFT Right 12/09/2019   Procedure: REDO RIGHT FEMORAL-POPLITEAL ARTERY BYPASS GRAFT;  Surgeon: Sherren Kerns, MD;  Location: [facility] OR;  Service: Vascular;  Laterality: Right;   INJECTION KNEE Right Aug. 2016   Gel injection for pain   LUMBAR FUSION  07/06/2011   TONSILLECTOMY     as a teenager    TUBAL LIGATION     Social History   Occupational History    Occupation: retired    Associate Professor: RETIRED  Tobacco Use   Smoking status: Former    Packs/day: 2.00    Years: 30.00    Additional pack years: 0.00    Total pack years: 60.00    Types: Cigarettes    Quit date: 06/18/1993    Years since quitting: 29.2   Smokeless tobacco: Never   Tobacco comments:    QUIT IN 1995  Vaping Use   Vaping Use: Never used  Substance and Sexual Activity   Alcohol use: No    Alcohol/week: 0.0 standard drinks of alcohol   Drug use: No   Sexual activity: Not on file

## 2022-09-28 ENCOUNTER — Ambulatory Visit: Payer: Medicare Other | Admitting: Physical Therapy

## 2022-09-28 ENCOUNTER — Encounter: Payer: Self-pay | Admitting: Physical Therapy

## 2022-09-28 VITALS — BP 121/74 | HR 77

## 2022-09-28 DIAGNOSIS — R42 Dizziness and giddiness: Secondary | ICD-10-CM | POA: Diagnosis not present

## 2022-09-28 DIAGNOSIS — H8112 Benign paroxysmal vertigo, left ear: Secondary | ICD-10-CM | POA: Diagnosis not present

## 2022-09-28 DIAGNOSIS — R2681 Unsteadiness on feet: Secondary | ICD-10-CM | POA: Diagnosis not present

## 2022-09-28 NOTE — Patient Instructions (Signed)
Gaze Stabilization: Sitting    Keeping eyes on target on wall a few feet away, tilt head down 15-30 and move head side to side for __30__ seconds. Repeat while moving head up and down for _30___ seconds.  Perform 2-3 sets of each.   Do _2___ sessions per day.   Copyright  VHI. All rights reserved.   

## 2022-09-28 NOTE — Therapy (Signed)
OUTPATIENT PHYSICAL THERAPY VESTIBULAR TREATMENT      Patient Name: [name] [name] MRN: [id number] DOB:[birth year], 89 y.o., female Today's Date: 09/28/2022  END OF SESSION:  PT End of Session - 09/28/22 1019     Visit Number 3    Number of Visits 9    Date for PT Re-Evaluation 10/22/22    Authorization Type Medicare    PT Start Time 1017    PT Stop Time 1058    PT Time Calculation (min) 41 min    Activity Tolerance Patient tolerated treatment well    Behavior During Therapy WFL for tasks assessed/performed             Past Medical History:  Diagnosis Date   Acute on chronic diastolic heart failure (HCC) 12/30/2020   Allergic rhinoconjunctivitis    Allergy    Anxiety    pt. managed- uses deep breathing    Arthritis    low back , stenosis   COLONIC POLYPS, RECURRENT 08/29/2006   2008 last colonoscopy. No further colonoscopy.      COPD (chronic obstructive pulmonary disease) (HCC)    Diverticulosis    Dysrhythmia    afib   Eczema    Environmental allergies    allergy shot- q friday in Dr. Roxy Cedar office. PFT's abnormal- recommended Spiriva to use preop & will d/c after surgery   GERD (gastroesophageal reflux disease)    Hiatal hernia    Hyperlipidemia    Hypertension    Lung nodule 2011   Paroxysmal atrial fibrillation (HCC) 11/27/2019   Peripheral vascular disease (HCC)    Shingles    Stenosis of popliteal artery (HCC)    blood clots in legs long ago     Trigeminal neuralgia    Left buttocks   Past Surgical History:  Procedure Laterality Date   ABDOMINAL AORTAGRAM N/A 06/17/2011   Procedure: ABDOMINAL Ronny Flurry;  Surgeon: Sherren Kerns, MD;  Location: [facility] CATH LAB;  Service: Cardiovascular;  Laterality: N/A;   ABDOMINAL AORTOGRAM W/LOWER EXTREMITY Bilateral 06/22/2018   Procedure: ABDOMINAL AORTOGRAM W/LOWER EXTREMITY;  Surgeon: Sherren Kerns, MD;  Location: [facility] INVASIVE CV LAB;  Service: Cardiovascular;  Laterality: Bilateral;   ABDOMINAL AORTOGRAM  W/LOWER EXTREMITY Bilateral 11/29/2019   Procedure: ABDOMINAL AORTOGRAM W/LOWER EXTREMITY;  Surgeon: Sherren Kerns, MD;  Location: [facility] INVASIVE CV LAB;  Service: Cardiovascular;  Laterality: Bilateral;   CATARACT EXTRACTION, BILATERAL     w IOL   CHOLECYSTECTOMY  1998   COLONOSCOPY     ENDARTERECTOMY FEMORAL Left 12/09/2019   Procedure: ENDARTERECTOMY FEMORAL with bovine patch angioplasty.;  Surgeon: Sherren Kerns, MD;  Location: [facility] OR;  Service: Vascular;  Laterality: Left;   FEMORAL-POPLITEAL BYPASS GRAFT        x2 surgeries 1990's & 2009   FEMORAL-POPLITEAL BYPASS GRAFT Right 12/09/2019   Procedure: REDO RIGHT FEMORAL-POPLITEAL ARTERY BYPASS GRAFT;  Surgeon: Sherren Kerns, MD;  Location: [facility] OR;  Service: Vascular;  Laterality: Right;   INJECTION KNEE Right Aug. 2016   Gel injection for pain   LUMBAR FUSION  07/06/2011   TONSILLECTOMY     as a teenager    TUBAL LIGATION     Patient Active Problem List   Diagnosis Date Noted   Vertigo 05/04/2022   Mild sleep apnea 05/03/2021   Chronic diastolic heart failure (HCC) 12/30/2020   Left groin hernia 05/22/2020   Anxiety state    Rectal bleeding    Ischemic colitis (HCC)    Status post femoral-popliteal  bypass surgery 12/09/2019   Persistent atrial fibrillation (HCC) 12/03/2019   Secondary hypercoagulable state (HCC) 12/03/2019   Chronic bilateral low back pain with right-sided sciatica 08/29/2019   Gross hematuria 06/01/2018   CKD (chronic kidney disease), stage III (HCC) 08/07/2017   Unilateral primary osteoarthritis, right knee 04/07/2016   Acute gout of left foot 03/22/2016   Pain in right ankle and joints of right foot 08/28/2014   Trigeminal neuralgia    Syncope 12/29/2011   Chronic low back pain 09/13/2010   Allergic rhinitis due to pollen 06/25/2010   COPD mixed type (HCC) 01/22/2010   Solitary pulmonary nodule 01/12/2010   Insulin resistance 01/06/2009   Osteopenia 01/06/2009   Eczema 11/26/2007   Peripheral  arterial disease (HCC) 06/29/2007   Hyperlipidemia 10/23/2006   Essential hypertension 10/23/2006   ESOPHAGEAL STRICTURE 08/09/1993    PCP: Shelva Majestic, MD  REFERRING PROVIDER:  Shelva Majestic, MD  REFERRING DIAG: R42 (ICD-10-CM) - Vertigo   THERAPY DIAG:  BPPV (benign paroxysmal positional vertigo), left  Unsteadiness on feet  Dizziness and giddiness  ONSET DATE: 08/05/2022  Rationale for Evaluation and Treatment: Rehabilitation  SUBJECTIVE:   SUBJECTIVE STATEMENT: Feeling a little sluggish this morning. Has been doing her rolling in bed and not been dizzy.   Pt accompanied by: self  PERTINENT HISTORY: PMH:  PAD, COPD, DVT, HTN, A fib, Gout, trigeminal neuralgia, post herpetic neuralgia, OA of right knee with injections, hx of lumbar fusion, chronic diastolic heart failure  Per PCP on 08/05/22: :she states symptoms started back in January before hospitalization. Even sitting forward in chair might get sensation of entire head still moving when she stops and sensation that head is too heavy. Turning head quickly can cause it and loses focus. Can even happen with turning over bed- has based on be very careful with head movement  May have better days and then worse days- hard to make plans as a result   Pt hospitalized Jan 2024 with syncope/dizziness, per PT note was found to have vestibular hypofunction and L posterior canal and horizontal canal BPPV.   PAIN:  Are you having pain? No  Vitals:   09/28/22 1027  BP: 121/74  Pulse: 77     PRECAUTIONS: None  WEIGHT BEARING RESTRICTIONS: No  FALLS: Has patient fallen in last 6 months? Yes. Number of falls 1  LIVING ENVIRONMENT: Lives with: lives alone, son lives close by and is over almost everyday, helps with grocery shopping  Lives in: Other Condo Stairs: No Has following equipment at home: Single point cane, Environmental consultant - 2 wheeled, Environmental consultant - 4 wheeled, shower chair, and Grab bars  PLOF: Independent with  household mobility with device and Independent with community mobility with device Drives according to the way she feels.   PATIENT GOALS: Wants to be able to walk and get out and do things, walk longer distances. Wants to be more independent   OBJECTIVE:   DIAGNOSTIC FINDINGS: MRI brain 04/2022:  IMPRESSION: Normal brain MRI for age.  No acute intracranial abnormality.  COGNITION: Overall cognitive status: Within functional limits for tasks assessed GAIT: Gait pattern: step through pattern and decreased stride length Distance walked: Clinic distances  Assistive device utilized: Walker - 2 wheeled Level of assistance: SBA  VESTIBULAR TREATMENT:  DATE: 09/28/22   POSITIONAL TESTING: Right Dix-Hallpike: no nystagmus Left Dix-Hallpike: upbeating, left nystagmus and very brief lasting only 1-2 seconds  Right Roll Test: no nystagmus Left Roll Test: no nystagmus Right Sidelying: no nystagmus Left Sidelying: no nystagmus   Canalith Repositioning:  Epley Left: Number of Reps: 1, Response to Treatment: symptoms resolved, and Comment: pt reporting no dizziness during testing positions, some dizziness with return to upright.    Seated VOR: Horizontal direction: x30 seconds Vertical direction: x30 seconds Pt with no dizziness, just having more difficulty in vertical direction.   Pt able to ambulate in and out of session independently with RW.   PATIENT EDUCATION: Education details: Educated about etiology of BPPV with pt's daughter present during this session and how to treat, reoccurrence. Discussed resolution of BPPV and beginning on seated VOR exercises for improved gaze stabilization (pt was given these previously by a vestibular PT). Discussed will keep visits next week and if pt continues to have no spinning/BPPV episodes, then can be discharged from PT and then can get HHPT (pt and pt's  daughter reports that this is much easier for them). Both in agreement with plan.  Person educated: Patient and Child(ren)  Education method: Explanation, Demonstration, and Handouts Education comprehension: verbalized understanding and returned demonstration  HOME EXERCISE PROGRAM: Repeated rolling for horizontal canal BPPV   GOALS: Goals reviewed with patient? Yes  SHORT TERM GOALS: ALL STGS = LTGS  LONG TERM GOALS: Target date: 10/20/2022  Pt will be independent with final HEP for vestibular deficits in order to build upon functional gains made in therapy. Baseline:  Goal status: INITIAL  2.  Pt will improve DPS to at least 59 in order to demo improved functional outcomes related to dizziness.   Baseline: 46 Goal status: INITIAL  3.  Pt will demo negative positional testing in order to demo resolution of L BPPV.  Baseline: L posterior canal BPPV Goal status: INITIAL  4.  DVA to be assessed with goal written as appropriate.  Baseline:  Goal status: INITIAL    ASSESSMENT:  CLINICAL IMPRESSION: Re-assessed positional testing with pt negative for horizontal canalithiasis today. Pt with very slight upbeating rotary nystagmus in L DixHallpike and pt reporting very slight dizziness. Treated x1 rep of the Epley maneuver with pt demonstrating resolution of BPPV afterwards and pt feeling good at the end of the session. Got pt started on VOR exercises for gaze stabilization. Discussed will keep next week's appts and if BPPV is cleared, then pt can be discharged and go to HHPT to work on balance training (this is easier for pt and pt's family). Both pt and pt's daughter in agreement with plan.    OBJECTIVE IMPAIRMENTS: Abnormal gait, decreased activity tolerance, decreased balance, difficulty walking, dizziness, and postural dysfunction.   ACTIVITY LIMITATIONS: bending, bed mobility, reach over head, and locomotion level  PARTICIPATION LIMITATIONS: meal prep, driving, shopping, and  community activity  PERSONAL FACTORS: Age, Past/current experiences, Time since onset of injury/illness/exacerbation, and 3+ comorbidities: PAD, COPD, DVT, HTN, A fib, Gout, trigeminal neuralgia, post herpetic neuralgia, OA of right knee with injections, hx of lumbar fusion, chronic diastolic heart failure  are also affecting patient's functional outcome.   REHAB POTENTIAL: Good  CLINICAL DECISION MAKING: Evolving/moderate complexity  EVALUATION COMPLEXITY: Moderate   PLAN:  PT FREQUENCY: 2x/week  PT DURATION: 4 weeks  PLANNED INTERVENTIONS: Therapeutic exercises, Therapeutic activity, Neuromuscular re-education, Balance training, Gait training, Patient/Family education, Self Care, Vestibular training, Canalith repositioning, DME instructions, and Re-evaluation  PLAN FOR NEXT SESSION: DO FOTO!!! any more BPPV or dizziness spells? Progress VOR. If things good, pt can be discharged and can follow up with HHPT    Drake Leach, PT, DPT  09/28/2022, 12:02 PM

## 2022-10-03 ENCOUNTER — Other Ambulatory Visit: Payer: Self-pay | Admitting: Family Medicine

## 2022-10-04 ENCOUNTER — Ambulatory Visit: Payer: Medicare Other | Admitting: Physical Therapy

## 2022-10-04 ENCOUNTER — Encounter: Payer: Self-pay | Admitting: Physical Therapy

## 2022-10-04 VITALS — BP 135/64 | HR 56

## 2022-10-04 DIAGNOSIS — R42 Dizziness and giddiness: Secondary | ICD-10-CM

## 2022-10-04 DIAGNOSIS — H8112 Benign paroxysmal vertigo, left ear: Secondary | ICD-10-CM

## 2022-10-04 DIAGNOSIS — R2681 Unsteadiness on feet: Secondary | ICD-10-CM

## 2022-10-04 NOTE — Therapy (Signed)
OUTPATIENT PHYSICAL THERAPY VESTIBULAR TREATMENT      Patient Name: [name] [name] MRN: [id number] DOB:1924-05-08, 89 y.o., female Today's Date: 10/04/2022  END OF SESSION:  PT End of Session - 10/04/22 1017     Visit Number 4    Number of Visits 9    Date for PT Re-Evaluation 10/22/22    Authorization Type Medicare    PT Start Time 1015    PT Stop Time 1058    PT Time Calculation (min) 43 min    Activity Tolerance Patient tolerated treatment well    Behavior During Therapy WFL for tasks assessed/performed             Past Medical History:  Diagnosis Date   Acute on chronic diastolic heart failure (HCC) 12/30/2020   Allergic rhinoconjunctivitis    Allergy    Anxiety    pt. managed- uses deep breathing    Arthritis    low back , stenosis   COLONIC POLYPS, RECURRENT 08/29/2006   2008 last colonoscopy. No further colonoscopy.      COPD (chronic obstructive pulmonary disease) (HCC)    Diverticulosis    Dysrhythmia    afib   Eczema    Environmental allergies    allergy shot- q friday in Dr. Roxy Cedar office. PFT's abnormal- recommended Spiriva to use preop & will d/c after surgery   GERD (gastroesophageal reflux disease)    Hiatal hernia    Hyperlipidemia    Hypertension    Lung nodule 2011   Paroxysmal atrial fibrillation (HCC) 11/27/2019   Peripheral vascular disease (HCC)    Shingles    Stenosis of popliteal artery (HCC)    blood clots in legs long ago     Trigeminal neuralgia    Left buttocks   Past Surgical History:  Procedure Laterality Date   ABDOMINAL AORTAGRAM N/A 06/17/2011   Procedure: ABDOMINAL Ronny Flurry;  Surgeon: Sherren Kerns, MD;  Location: [facility] CATH LAB;  Service: Cardiovascular;  Laterality: N/A;   ABDOMINAL AORTOGRAM W/LOWER EXTREMITY Bilateral 06/22/2018   Procedure: ABDOMINAL AORTOGRAM W/LOWER EXTREMITY;  Surgeon: Sherren Kerns, MD;  Location: [facility] INVASIVE CV LAB;  Service: Cardiovascular;  Laterality: Bilateral;   ABDOMINAL AORTOGRAM  W/LOWER EXTREMITY Bilateral 11/29/2019   Procedure: ABDOMINAL AORTOGRAM W/LOWER EXTREMITY;  Surgeon: Sherren Kerns, MD;  Location: [facility] INVASIVE CV LAB;  Service: Cardiovascular;  Laterality: Bilateral;   CATARACT EXTRACTION, BILATERAL     w IOL   CHOLECYSTECTOMY  1998   COLONOSCOPY     ENDARTERECTOMY FEMORAL Left 12/09/2019   Procedure: ENDARTERECTOMY FEMORAL with bovine patch angioplasty.;  Surgeon: Sherren Kerns, MD;  Location: [facility] OR;  Service: Vascular;  Laterality: Left;   FEMORAL-POPLITEAL BYPASS GRAFT        x2 surgeries 1990's & 2009   FEMORAL-POPLITEAL BYPASS GRAFT Right 12/09/2019   Procedure: REDO RIGHT FEMORAL-POPLITEAL ARTERY BYPASS GRAFT;  Surgeon: Sherren Kerns, MD;  Location: [facility] OR;  Service: Vascular;  Laterality: Right;   INJECTION KNEE Right Aug. 2016   Gel injection for pain   LUMBAR FUSION  07/06/2011   TONSILLECTOMY     as a teenager    TUBAL LIGATION     Patient Active Problem List   Diagnosis Date Noted   Vertigo 05/04/2022   Mild sleep apnea 05/03/2021   Chronic diastolic heart failure (HCC) 12/30/2020   Left groin hernia 05/22/2020   Anxiety state    Rectal bleeding    Ischemic colitis (HCC)    Status post femoral-popliteal  bypass surgery 12/09/2019   Persistent atrial fibrillation (HCC) 12/03/2019   Secondary hypercoagulable state (HCC) 12/03/2019   Chronic bilateral low back pain with right-sided sciatica 08/29/2019   Gross hematuria 06/01/2018   CKD (chronic kidney disease), stage III (HCC) 08/07/2017   Unilateral primary osteoarthritis, right knee 04/07/2016   Acute gout of left foot 03/22/2016   Pain in right ankle and joints of right foot 08/28/2014   Trigeminal neuralgia    Syncope 12/29/2011   Chronic low back pain 09/13/2010   Allergic rhinitis due to pollen 06/25/2010   COPD mixed type (HCC) 01/22/2010   Solitary pulmonary nodule 01/12/2010   Insulin resistance 01/06/2009   Osteopenia 01/06/2009   Eczema 11/26/2007   Peripheral  arterial disease (HCC) 06/29/2007   Hyperlipidemia 10/23/2006   Essential hypertension 10/23/2006   ESOPHAGEAL STRICTURE 08/09/1993    PCP: Shelva Majestic, MD  REFERRING PROVIDER:  Shelva Majestic, MD  REFERRING DIAG: R42 (ICD-10-CM) - Vertigo   THERAPY DIAG:  BPPV (benign paroxysmal positional vertigo), left  Unsteadiness on feet  Dizziness and giddiness  ONSET DATE: 08/05/2022  Rationale for Evaluation and Treatment: Rehabilitation  SUBJECTIVE:   SUBJECTIVE STATEMENT: Reports not having a good day today. Notices "something is there" with her vision that his bothering her. Not real spots and nothing really blocking her vision. "Just something is there that isn't the way its supposed to be". Had a piercing pain behind her R eye over the weekend and it lasted 30 minutes. Reports it just went away on its own. Went to church yesterday and did pretty good. Has done her exercises at home. Had a little spinning yesterday - came and gone very quickly, didn't really realize it. Has a little bit of dizziness each time when looking up to her thermostat.   Pt accompanied by: self  PERTINENT HISTORY: PMH:  PAD, COPD, DVT, HTN, A fib, Gout, trigeminal neuralgia, post herpetic neuralgia, OA of right knee with injections, hx of lumbar fusion, chronic diastolic heart failure  Per PCP on 08/05/22: :she states symptoms started back in January before hospitalization. Even sitting forward in chair might get sensation of entire head still moving when she stops and sensation that head is too heavy. Turning head quickly can cause it and loses focus. Can even happen with turning over bed- has based on be very careful with head movement  May have better days and then worse days- hard to make plans as a result   Pt hospitalized Jan 2024 with syncope/dizziness, per PT note was found to have vestibular hypofunction and L posterior canal and horizontal canal BPPV.   PAIN:  Are you having pain?  No  Vitals:   10/04/22 1025  BP: 135/64  Pulse: (!) 56      PRECAUTIONS: None  WEIGHT BEARING RESTRICTIONS: No  FALLS: Has patient fallen in last 6 months? Yes. Number of falls 1  LIVING ENVIRONMENT: Lives with: lives alone, son lives close by and is over almost everyday, helps with grocery shopping  Lives in: Other Condo Stairs: No Has following equipment at home: Single point cane, Environmental consultant - 2 wheeled, Environmental consultant - 4 wheeled, shower chair, and Grab bars  PLOF: Independent with household mobility with device and Independent with community mobility with device Drives according to the way she feels.   PATIENT GOALS: Wants to be able to walk and get out and do things, walk longer distances. Wants to be more independent   OBJECTIVE:   DIAGNOSTIC FINDINGS: MRI brain  04/2022:  IMPRESSION: Normal brain MRI for age.  No acute intracranial abnormality.  COGNITION: Overall cognitive status: Within functional limits for tasks assessed GAIT: Gait pattern: step through pattern and decreased stride length Distance walked: Clinic distances  Assistive device utilized: Walker - 2 wheeled Level of assistance: SBA  VESTIBULAR TREATMENT:                                                                                                   DATE: 10/04/22   POSITIONAL TESTING: Right Dix-Hallpike: no nystagmus Left Dix-Hallpike: upbeating, left nystagmus and lasting approx. 10-12 seconds, dizziness with return to upright. 2nd assessment with dizziness mild and lasting approx. 5 seconds.  Right Sidelying: no nystagmus Left Sidelying: no nystagmus   Canalith Repositioning:  Epley Left: Number of Reps: 2, Response to Treatment: symptoms improved, and Comment: pt reporting no dizziness during testing positions, some dizziness with return to upright.    Access Code: [email or web address] URL: [email or web address] Date: 10/04/2022 Prepared by: Sherlie Ban  Showed pt how to do [facility]  exercises at home for VERY MILD L posterior canal BPPV remaining. Pt with very mild and very brief dizziness in L sidelying and with return to upright. Pt's symptoms improved with each rep. Provided as HEP to work on at home.   Exercises - Brandt-Daroff Vestibular Exercise  - 2 x daily - 7 x weekly - 1 sets - 4-5 reps  PATIENT EDUCATION: Education details: Return of BPPV and improvement with Epley maneuvers x2 reps, Austin Miles exercises for home and continue with VOR x1  Person educated: Patient  Education method: Explanation, Demonstration, and Handouts Education comprehension: verbalized understanding and returned demonstration  HOME EXERCISE PROGRAM: Repeated rolling for horizontal canal BPPV (don't need this anymore) Seated VOR x30 seconds   Access Code: [email or web address] URL: [email or web address] Date: 10/04/2022 Prepared by: Sherlie Ban  Exercises - Brandt-Daroff Vestibular Exercise  - 2 x daily - 7 x weekly - 1 sets - 4-5 reps  GOALS: Goals reviewed with patient? Yes  SHORT TERM GOALS: ALL STGS = LTGS  LONG TERM GOALS: Target date: 10/20/2022  Pt will be independent with final HEP for vestibular deficits in order to build upon functional gains made in therapy. Baseline:  Goal status: INITIAL  2.  Pt will improve DPS to at least 59 in order to demo improved functional outcomes related to dizziness.   Baseline: 46 Goal status: INITIAL  3.  Pt will demo negative positional testing in order to demo resolution of L BPPV.  Baseline: L posterior canal BPPV Goal status: INITIAL  4.  DVA to be assessed with goal written as appropriate.  Baseline:  Goal status: INITIAL    ASSESSMENT:  CLINICAL IMPRESSION: Pt reporting vision changes today and feeling more off. Assessed BP and WNL and pt with no other signs/sx. Re-assessed positional testing with pt with L upbeating rotary nystagmus lasting approx 10-12 seconds, indicating L posterior canalithiasis. Treated  x2 reps of the Epley with pt demonstrating an improvement in symptoms. Pt only remaining with VERY MILD and brief nystagmus. Provided pt with Austin Miles exercises  for home ,with pt reporting feeling better with each rep with less symptoms. Pt also reports at the end of session that vision feels back to normal and pt feeling much better than when she came in. Will continue per POC.      OBJECTIVE IMPAIRMENTS: Abnormal gait, decreased activity tolerance, decreased balance, difficulty walking, dizziness, and postural dysfunction.   ACTIVITY LIMITATIONS: bending, bed mobility, reach over head, and locomotion level  PARTICIPATION LIMITATIONS: meal prep, driving, shopping, and community activity  PERSONAL FACTORS: Age, Past/current experiences, Time since onset of injury/illness/exacerbation, and 3+ comorbidities: PAD, COPD, DVT, HTN, A fib, Gout, trigeminal neuralgia, post herpetic neuralgia, OA of right knee with injections, hx of lumbar fusion, chronic diastolic heart failure  are also affecting patient's functional outcome.   REHAB POTENTIAL: Good  CLINICAL DECISION MAKING: Evolving/moderate complexity  EVALUATION COMPLEXITY: Moderate   PLAN:  PT FREQUENCY: 2x/week  PT DURATION: 4 weeks  PLANNED INTERVENTIONS: Therapeutic exercises, Therapeutic activity, Neuromuscular re-education, Balance training, Gait training, Patient/Family education, Self Care, Vestibular training, Canalith repositioning, DME instructions, and Re-evaluation  PLAN FOR NEXT SESSION: DO FOTO!!! any more BPPV or dizziness spells? Progress VOR. If things good, pt can be discharged and can follow up with HHPT    Drake Leach, PT, DPT  10/04/2022, 12:04 PM

## 2022-10-05 DIAGNOSIS — H5711 Ocular pain, right eye: Secondary | ICD-10-CM | POA: Diagnosis not present

## 2022-10-05 DIAGNOSIS — H02834 Dermatochalasis of left upper eyelid: Secondary | ICD-10-CM | POA: Diagnosis not present

## 2022-10-05 DIAGNOSIS — H40053 Ocular hypertension, bilateral: Secondary | ICD-10-CM | POA: Diagnosis not present

## 2022-10-05 DIAGNOSIS — H10413 Chronic giant papillary conjunctivitis, bilateral: Secondary | ICD-10-CM | POA: Diagnosis not present

## 2022-10-05 DIAGNOSIS — H11123 Conjunctival concretions, bilateral: Secondary | ICD-10-CM | POA: Diagnosis not present

## 2022-10-05 DIAGNOSIS — H02831 Dermatochalasis of right upper eyelid: Secondary | ICD-10-CM | POA: Diagnosis not present

## 2022-10-05 DIAGNOSIS — Z961 Presence of intraocular lens: Secondary | ICD-10-CM | POA: Diagnosis not present

## 2022-10-06 ENCOUNTER — Encounter: Payer: Self-pay | Admitting: Family Medicine

## 2022-10-06 ENCOUNTER — Ambulatory Visit: Payer: Medicare Other | Admitting: Physical Therapy

## 2022-10-06 ENCOUNTER — Encounter: Payer: Self-pay | Admitting: Physical Therapy

## 2022-10-06 VITALS — BP 114/69 | HR 89

## 2022-10-06 DIAGNOSIS — R42 Dizziness and giddiness: Secondary | ICD-10-CM | POA: Diagnosis not present

## 2022-10-06 DIAGNOSIS — R2681 Unsteadiness on feet: Secondary | ICD-10-CM | POA: Diagnosis not present

## 2022-10-06 DIAGNOSIS — H8112 Benign paroxysmal vertigo, left ear: Secondary | ICD-10-CM | POA: Diagnosis not present

## 2022-10-06 DIAGNOSIS — I4819 Other persistent atrial fibrillation: Secondary | ICD-10-CM | POA: Diagnosis not present

## 2022-10-06 NOTE — Therapy (Addendum)
OUTPATIENT PHYSICAL THERAPY VESTIBULAR TREATMENT      Patient Name: [name] [name] MRN: [id number] DOB:11-03-1924, 89 y.o., female Today's Date: 10/06/2022  END OF SESSION:  PT End of Session - 10/06/22 0847     Visit Number 5    Number of Visits 9    Date for PT Re-Evaluation 10/22/22    Authorization Type Medicare    PT Start Time 0843    PT Stop Time 0928    PT Time Calculation (min) 45 min    Activity Tolerance Patient tolerated treatment well    Behavior During Therapy [facility] for tasks assessed/performed             Past Medical History:  Diagnosis Date   Acute on chronic diastolic heart failure (HCC) 12/30/2020   Allergic rhinoconjunctivitis    Allergy    Anxiety    pt. managed- uses deep breathing    Arthritis    low back , stenosis   COLONIC POLYPS, RECURRENT 08/29/2006   2008 last colonoscopy. No further colonoscopy.      COPD (chronic obstructive pulmonary disease) (HCC)    Diverticulosis    Dysrhythmia    afib   Eczema    Environmental allergies    allergy shot- q friday in Dr. Roxy Cedar office. PFT's abnormal- recommended Spiriva to use preop & will d/c after surgery   GERD (gastroesophageal reflux disease)    Hiatal hernia    Hyperlipidemia    Hypertension    Lung nodule 2011   Paroxysmal atrial fibrillation (HCC) 11/27/2019   Peripheral vascular disease (HCC)    Shingles    Stenosis of popliteal artery (HCC)    blood clots in legs long ago     Trigeminal neuralgia    Left buttocks   Past Surgical History:  Procedure Laterality Date   ABDOMINAL AORTAGRAM N/A 06/17/2011   Procedure: ABDOMINAL Ronny Flurry;  Surgeon: Sherren Kerns, MD;  Location: [facility] CATH LAB;  Service: Cardiovascular;  Laterality: N/A;   ABDOMINAL AORTOGRAM W/LOWER EXTREMITY Bilateral 06/22/2018   Procedure: ABDOMINAL AORTOGRAM W/LOWER EXTREMITY;  Surgeon: Sherren Kerns, MD;  Location: [facility] INVASIVE CV LAB;  Service: Cardiovascular;  Laterality: Bilateral;   ABDOMINAL AORTOGRAM  W/LOWER EXTREMITY Bilateral 11/29/2019   Procedure: ABDOMINAL AORTOGRAM W/LOWER EXTREMITY;  Surgeon: Sherren Kerns, MD;  Location: [facility] INVASIVE CV LAB;  Service: Cardiovascular;  Laterality: Bilateral;   CATARACT EXTRACTION, BILATERAL     w IOL   CHOLECYSTECTOMY  1998   COLONOSCOPY     ENDARTERECTOMY FEMORAL Left 12/09/2019   Procedure: ENDARTERECTOMY FEMORAL with bovine patch angioplasty.;  Surgeon: Sherren Kerns, MD;  Location: [facility] OR;  Service: Vascular;  Laterality: Left;   FEMORAL-POPLITEAL BYPASS GRAFT        x2 surgeries 1990's & 2009   FEMORAL-POPLITEAL BYPASS GRAFT Right 12/09/2019   Procedure: REDO RIGHT FEMORAL-POPLITEAL ARTERY BYPASS GRAFT;  Surgeon: Sherren Kerns, MD;  Location: [facility] OR;  Service: Vascular;  Laterality: Right;   INJECTION KNEE Right Aug. 2016   Gel injection for pain   LUMBAR FUSION  07/06/2011   TONSILLECTOMY     as a teenager    TUBAL LIGATION     Patient Active Problem List   Diagnosis Date Noted   Vertigo 05/04/2022   Mild sleep apnea 05/03/2021   Chronic diastolic heart failure (HCC) 12/30/2020   Left groin hernia 05/22/2020   Anxiety state    Rectal bleeding    Ischemic colitis (HCC)    Status post femoral-popliteal  bypass surgery 12/09/2019   Persistent atrial fibrillation (HCC) 12/03/2019   Secondary hypercoagulable state (HCC) 12/03/2019   Chronic bilateral low back pain with right-sided sciatica 08/29/2019   Gross hematuria 06/01/2018   CKD (chronic kidney disease), stage III (HCC) 08/07/2017   Unilateral primary osteoarthritis, right knee 04/07/2016   Acute gout of left foot 03/22/2016   Pain in right ankle and joints of right foot 08/28/2014   Trigeminal neuralgia    Syncope 12/29/2011   Chronic low back pain 09/13/2010   Allergic rhinitis due to pollen 06/25/2010   COPD mixed type (HCC) 01/22/2010   Solitary pulmonary nodule 01/12/2010   Insulin resistance 01/06/2009   Osteopenia 01/06/2009   Eczema 11/26/2007   Peripheral  arterial disease (HCC) 06/29/2007   Hyperlipidemia 10/23/2006   Essential hypertension 10/23/2006   ESOPHAGEAL STRICTURE 08/09/1993    PCP: Shelva Majestic, MD  REFERRING PROVIDER:  Shelva Majestic, MD  REFERRING DIAG: R42 (ICD-10-CM) - Vertigo   THERAPY DIAG:  Unsteadiness on feet  BPPV (benign paroxysmal positional vertigo), left  Dizziness and giddiness  ONSET DATE: 08/05/2022  Rationale for Evaluation and Treatment: Rehabilitation  SUBJECTIVE:   SUBJECTIVE STATEMENT: Tried the [employer] exercises at home, sometimes they went well and sometimes they didn't. Saw Dr. Dione Booze (eye doctor) yesterday and everything is good with vision.  Thinks that pain behind R eye could be something in regards to her hx of trigeminal neuralgia. Wants her to go back to Dr. Everlena Cooper. Reports feeling swimmy headed right now and feels like something is over her eyes.   Pt accompanied by: self  PERTINENT HISTORY: PMH:  PAD, COPD, DVT, HTN, A fib, Gout, trigeminal neuralgia, post herpetic neuralgia, OA of right knee with injections, hx of lumbar fusion, chronic diastolic heart failure  Per PCP on 08/05/22: :she states symptoms started back in January before hospitalization. Even sitting forward in chair might get sensation of entire head still moving when she stops and sensation that head is too heavy. Turning head quickly can cause it and loses focus. Can even happen with turning over bed- has based on be very careful with head movement  May have better days and then worse days- hard to make plans as a result   Pt hospitalized Jan 2024 with syncope/dizziness, per PT note was found to have vestibular hypofunction and L posterior canal and horizontal canal BPPV.   PAIN:  Are you having pain? No  Vitals:   10/06/22 0901  BP: 114/69  Pulse: 89       PRECAUTIONS: None  WEIGHT BEARING RESTRICTIONS: No  FALLS: Has patient fallen in last 6 months? Yes. Number of falls 1  LIVING  ENVIRONMENT: Lives with: lives alone, son lives close by and is over almost everyday, helps with grocery shopping  Lives in: Other Condo Stairs: No Has following equipment at home: Single point cane, Environmental consultant - 2 wheeled, Environmental consultant - 4 wheeled, shower chair, and Grab bars  PLOF: Independent with household mobility with device and Independent with community mobility with device Drives according to the way she feels.   PATIENT GOALS: Wants to be able to walk and get out and do things, walk longer distances. Wants to be more independent   OBJECTIVE:   DIAGNOSTIC FINDINGS: MRI brain 04/2022:  IMPRESSION: Normal brain MRI for age.  No acute intracranial abnormality.  COGNITION: Overall cognitive status: Within functional limits for tasks assessed GAIT: Gait pattern: step through pattern and decreased stride length Distance walked: Clinic distances  Assistive device utilized: Walker - 2 wheeled Level of assistance: SBA  VESTIBULAR TREATMENT:                                                                                                   DATE: 10/06/22   POSITIONAL TESTING: Left Dix-Hallpike: upbeating, left nystagmus and lasting approx. 10 seconds, with re-assessment after 1 rep of Epley, pt with 1-2 bouts of nystagmus. Performed 2 re-assessments after 2 reps of the Epley with no dizziness/nystagmus in position or return to upright.   Right Roll Test: no nystagmus Left Roll Test: no nystagmus   Canalith Repositioning:  Epley Left: Number of Reps: 2, Response to Treatment: symptoms improved then resolved, and Comment: pt reporting no dizziness during testing positions, some dizziness with return to upright. Performed with 4" risers under mat table for incr extension   2nd PT helping with rolling pt into the 3rd position of the Epley and when sitting upright   Pt needing seated rest breaks between each rep   PATIENT EDUCATION: Education details: Resolution of BPPV at this time, reviewed  etiology of BPPV with pt and pt's daughter, discussed over the weekend performing Francee Piccolo Daroff exercises starting tomorrow and keeping a notebook of when/if she gets dizzy, staying hydrated, keeping next week's appts for OPPT before transitioning to [facility] vestibular PT  Person educated: Patient  Education method: Medical illustrator Education comprehension: verbalized understanding and returned demonstration  HOME EXERCISE PROGRAM:  Seated VOR x30 seconds   Access Code: [email or web address] URL: [email or web address] Date: 10/04/2022 Prepared by: Sherlie Ban  Exercises - Brandt-Daroff Vestibular Exercise  - 2 x daily - 7 x weekly - 1 sets - 4-5 reps  GOALS: Goals reviewed with patient? Yes  SHORT TERM GOALS: ALL STGS = LTGS  LONG TERM GOALS: Target date: 10/20/2022  Pt will be independent with final HEP for vestibular deficits in order to build upon functional gains made in therapy. Baseline:  Goal status: INITIAL  2.  Pt will improve DPS to at least 59 in order to demo improved functional outcomes related to dizziness.   Baseline: 46 Goal status: INITIAL  3.  Pt will demo negative positional testing in order to demo resolution of L BPPV.  Baseline: L posterior canal BPPV Goal status: INITIAL  4.  DVA to be assessed with goal written as appropriate.  Baseline:  Goal status: INITIAL    ASSESSMENT:  CLINICAL IMPRESSION: Pt reporting vision changes today and feeling more off. Assessed BP and WNL.  Re-assessed positional testing with pt with L upbeating rotary nystagmus lasting approx 10, indicating continued L posterior canalithiasis. Treated x2 reps of the Epley with elevating the mat table with 4" risers to help get more extension with pt demonstrating resolution of BPPV after and no dizziness with re-assessment of L [employer]. Educated over the weekend to perform [employer] exercises before returning to therapy and to keep note of any times that she  becomes dizzy. Pt verbalized understanding. Pt also reports at the end of session that vision feels back to normal and pt feeling much better than  when she came in. Will continue per POC.      OBJECTIVE IMPAIRMENTS: Abnormal gait, decreased activity tolerance, decreased balance, difficulty walking, dizziness, and postural dysfunction.   ACTIVITY LIMITATIONS: bending, bed mobility, reach over head, and locomotion level  PARTICIPATION LIMITATIONS: meal prep, driving, shopping, and community activity  PERSONAL FACTORS: Age, Past/current experiences, Time since onset of injury/illness/exacerbation, and 3+ comorbidities: PAD, COPD, DVT, HTN, A fib, Gout, trigeminal neuralgia, post herpetic neuralgia, OA of right knee with injections, hx of lumbar fusion, chronic diastolic heart failure  are also affecting patient's functional outcome.   REHAB POTENTIAL: Good  CLINICAL DECISION MAKING: Evolving/moderate complexity  EVALUATION COMPLEXITY: Moderate   PLAN:  PT FREQUENCY: 2x/week  PT DURATION: 4 weeks  PLANNED INTERVENTIONS: Therapeutic exercises, Therapeutic activity, Neuromuscular re-education, Balance training, Gait training, Patient/Family education, Self Care, Vestibular training, Canalith repositioning, DME instructions, and Re-evaluation  PLAN FOR NEXT SESSION: DO FOTO!!! any more BPPV or dizziness spells? Progress VOR. If things good, pt can be discharged and can follow up with HHPT    Drake Leach, PT, DPT  10/06/2022, 10:05 AM

## 2022-10-11 ENCOUNTER — Encounter: Payer: Self-pay | Admitting: Physical Therapy

## 2022-10-11 ENCOUNTER — Ambulatory Visit: Payer: Medicare Other | Admitting: Physical Therapy

## 2022-10-11 DIAGNOSIS — H8112 Benign paroxysmal vertigo, left ear: Secondary | ICD-10-CM

## 2022-10-11 DIAGNOSIS — R42 Dizziness and giddiness: Secondary | ICD-10-CM | POA: Diagnosis not present

## 2022-10-11 DIAGNOSIS — R2681 Unsteadiness on feet: Secondary | ICD-10-CM | POA: Diagnosis not present

## 2022-10-11 NOTE — Therapy (Signed)
OUTPATIENT PHYSICAL THERAPY VESTIBULAR TREATMENT      Patient Name: [name] [name] MRN: [id number] DOB:07/23/1924, 89 y.o., female Today's Date: 10/11/2022  END OF SESSION:  PT End of Session - 10/11/22 1019     Visit Number 6    Number of Visits 9    Date for PT Re-Evaluation 10/22/22    Authorization Type Medicare    PT Start Time 1017    PT Stop Time 1100    PT Time Calculation (min) 43 min    Activity Tolerance Patient tolerated treatment well   limited by dizziness   Behavior During Therapy [facility] for tasks assessed/performed             Past Medical History:  Diagnosis Date   Acute on chronic diastolic heart failure (HCC) 12/30/2020   Allergic rhinoconjunctivitis    Allergy    Anxiety    pt. managed- uses deep breathing    Arthritis    low back , stenosis   COLONIC POLYPS, RECURRENT 08/29/2006   2008 last colonoscopy. No further colonoscopy.      COPD (chronic obstructive pulmonary disease) (HCC)    Diverticulosis    Dysrhythmia    afib   Eczema    Environmental allergies    allergy shot- q friday in Dr. Roxy Cedar office. PFT's abnormal- recommended Spiriva to use preop & will d/c after surgery   GERD (gastroesophageal reflux disease)    Hiatal hernia    Hyperlipidemia    Hypertension    Lung nodule 2011   Paroxysmal atrial fibrillation (HCC) 11/27/2019   Peripheral vascular disease (HCC)    Shingles    Stenosis of popliteal artery (HCC)    blood clots in legs long ago     Trigeminal neuralgia    Left buttocks   Past Surgical History:  Procedure Laterality Date   ABDOMINAL AORTAGRAM N/A 06/17/2011   Procedure: ABDOMINAL Ronny Flurry;  Surgeon: Sherren Kerns, MD;  Location: [facility] CATH LAB;  Service: Cardiovascular;  Laterality: N/A;   ABDOMINAL AORTOGRAM W/LOWER EXTREMITY Bilateral 06/22/2018   Procedure: ABDOMINAL AORTOGRAM W/LOWER EXTREMITY;  Surgeon: Sherren Kerns, MD;  Location: [facility] INVASIVE CV LAB;  Service: Cardiovascular;  Laterality: Bilateral;    ABDOMINAL AORTOGRAM W/LOWER EXTREMITY Bilateral 11/29/2019   Procedure: ABDOMINAL AORTOGRAM W/LOWER EXTREMITY;  Surgeon: Sherren Kerns, MD;  Location: [facility] INVASIVE CV LAB;  Service: Cardiovascular;  Laterality: Bilateral;   CATARACT EXTRACTION, BILATERAL     w IOL   CHOLECYSTECTOMY  1998   COLONOSCOPY     ENDARTERECTOMY FEMORAL Left 12/09/2019   Procedure: ENDARTERECTOMY FEMORAL with bovine patch angioplasty.;  Surgeon: Sherren Kerns, MD;  Location: [facility] OR;  Service: Vascular;  Laterality: Left;   FEMORAL-POPLITEAL BYPASS GRAFT        x2 surgeries 1990's & 2009   FEMORAL-POPLITEAL BYPASS GRAFT Right 12/09/2019   Procedure: REDO RIGHT FEMORAL-POPLITEAL ARTERY BYPASS GRAFT;  Surgeon: Sherren Kerns, MD;  Location: [facility] OR;  Service: Vascular;  Laterality: Right;   INJECTION KNEE Right Aug. 2016   Gel injection for pain   LUMBAR FUSION  07/06/2011   TONSILLECTOMY     as a teenager    TUBAL LIGATION     Patient Active Problem List   Diagnosis Date Noted   Vertigo 05/04/2022   Mild sleep apnea 05/03/2021   Chronic diastolic heart failure (HCC) 12/30/2020   Left groin hernia 05/22/2020   Anxiety state    Rectal bleeding    Ischemic colitis (HCC)  Status post femoral-popliteal bypass surgery 12/09/2019   Persistent atrial fibrillation (HCC) 12/03/2019   Secondary hypercoagulable state (HCC) 12/03/2019   Chronic bilateral low back pain with right-sided sciatica 08/29/2019   Gross hematuria 06/01/2018   CKD (chronic kidney disease), stage III (HCC) 08/07/2017   Unilateral primary osteoarthritis, right knee 04/07/2016   Acute gout of left foot 03/22/2016   Pain in right ankle and joints of right foot 08/28/2014   Trigeminal neuralgia    Syncope 12/29/2011   Chronic low back pain 09/13/2010   Allergic rhinitis due to pollen 06/25/2010   COPD mixed type (HCC) 01/22/2010   Solitary pulmonary nodule 01/12/2010   Insulin resistance 01/06/2009   Osteopenia 01/06/2009   Eczema  11/26/2007   Peripheral arterial disease (HCC) 06/29/2007   Hyperlipidemia 10/23/2006   Essential hypertension 10/23/2006   ESOPHAGEAL STRICTURE 08/09/1993    PCP: Shelva Majestic, MD  REFERRING PROVIDER:  Shelva Majestic, MD  REFERRING DIAG: R42 (ICD-10-CM) - Vertigo   THERAPY DIAG:  Unsteadiness on feet  Dizziness and giddiness  BPPV (benign paroxysmal positional vertigo), left  ONSET DATE: 08/05/2022  Rationale for Evaluation and Treatment: Rehabilitation  SUBJECTIVE:   SUBJECTIVE STATEMENT: Tried the [employer] exercises at home, they went well and had no dizziness. Kept a log at home of dizziness, just felt some dizziness when looking up when adjusting the thermostat. Sleeping better at night, not having the dizziness when rolling around in bed.   Pt accompanied by: self  PERTINENT HISTORY: PMH:  PAD, COPD, DVT, HTN, A fib, Gout, trigeminal neuralgia, post herpetic neuralgia, OA of right knee with injections, hx of lumbar fusion, chronic diastolic heart failure  Per PCP on 08/05/22: :she states symptoms started back in January before hospitalization. Even sitting forward in chair might get sensation of entire head still moving when she stops and sensation that head is too heavy. Turning head quickly can cause it and loses focus. Can even happen with turning over bed- has based on be very careful with head movement  May have better days and then worse days- hard to make plans as a result   Pt hospitalized Jan 2024 with syncope/dizziness, per PT note was found to have vestibular hypofunction and L posterior canal and horizontal canal BPPV.   PAIN:  Are you having pain? No  There were no vitals filed for this visit.      PRECAUTIONS: None  WEIGHT BEARING RESTRICTIONS: No  FALLS: Has patient fallen in last 6 months? Yes. Number of falls 1  LIVING ENVIRONMENT: Lives with: lives alone, son lives close by and is over almost everyday, helps with grocery  shopping  Lives in: Other Condo Stairs: No Has following equipment at home: Single point cane, Environmental consultant - 2 wheeled, Environmental consultant - 4 wheeled, shower chair, and Grab bars  PLOF: Independent with household mobility with device and Independent with community mobility with device Drives according to the way she feels.   PATIENT GOALS: Wants to be able to walk and get out and do things, walk longer distances. Wants to be more independent   OBJECTIVE:   DIAGNOSTIC FINDINGS: MRI brain 04/2022:  IMPRESSION: Normal brain MRI for age.  No acute intracranial abnormality.  COGNITION: Overall cognitive status: Within functional limits for tasks assessed GAIT: Gait pattern: step through pattern and decreased stride length Distance walked: Clinic distances  Assistive device utilized: Walker - 2 wheeled Level of assistance: SBA  VESTIBULAR TREATMENT:  DATE: 10/11/22  FOTO: DPS:65 DFS: 60.6   POSITIONAL TESTING: Left Dix-Hallpike: upbeating, left nystagmus and lasting approx. 5 seconds .   Right Roll Test: no nystagmus Left Roll Test: no nystagmus    Canalith Repositioning:  Epley Left: Number of Reps: 1, Response to Treatment: symptoms resolved and Comment: pt reporting no dizziness during testing positions, some dizziness with return to upright. Performed with 4" risers under mat table for incr extension   Pt's son helping with rolling pt into the 3rd position of the Epley and when sitting upright   When going to re-assess after 1 rep of the Epley, pt with no nystagmus or dizziness, sat pt up and pt felt very slightly off, PT did a 2nd re-assessment just to make sure with pt taking incr time to lay back into position, pt then demonstrating geotropic nystagmus, indicating conversion to L horizontal canal. Pt reporting not feeling well with this and had seated rest break before treated.      Appiani/Gufoni Horizontal Geotropic: Number of Reps: 1, Response to Treatment: symptoms improved, and Comment: R sidelying to treat L horizontal canalithiasis. Did not get to re-assess at end of session due to time constraints    PATIENT EDUCATION: Education details: Results of FOTO (at beginning of session), discussed conversion to horizontal canal and for pt to work on repeated rolling at home to help with this, keeping Thursday's appt to continue to address BPPV due to recent conversion  Person educated: Patient  Education method: Explanation and Demonstration Education comprehension: verbalized understanding and returned demonstration  HOME EXERCISE PROGRAM:  Seated VOR x30 seconds   Access Code: [email or web address] URL: [email or web address] Date: 10/04/2022 Prepared by: Sherlie Ban  Exercises - Brandt-Daroff Vestibular Exercise  - 2 x daily - 7 x weekly - 1 sets - 4-5 reps  GOALS: Goals reviewed with patient? Yes  SHORT TERM GOALS: ALL STGS = LTGS  LONG TERM GOALS: Target date: 10/20/2022  Pt will be independent with final HEP for vestibular deficits in order to build upon functional gains made in therapy. Baseline:  Goal status: INITIAL  2.  Pt will improve DPS to at least 59 in order to demo improved functional outcomes related to dizziness.   Baseline: 46; 65 ON 10/11/22 Goal status: MET  3.  Pt will demo negative positional testing in order to demo resolution of L BPPV.  Baseline: L posterior canal BPPV Goal status: INITIAL  4.  DVA to be assessed with goal written as appropriate.  Baseline:  Goal status: INITIAL    ASSESSMENT:  CLINICAL IMPRESSION: Pt filled out FOTO today and met LTG #2 and demonstrated improvements in functional outcomes in regards to her dizziness. With re-assessing positional testing at start of session, pt with very slight L upbeating rotary nystagmus lasting approx. 5 seconds, indicating continued L posterior  canalithiasis. Treated x1 rep of the Epley with resolution afterwards. When going to re-assess a 2nd time just to make sure, it converted to L horizontal canal indicated by geotropic nystagmus. Treated x1 rep of the Gufoni maneuver - did not get to check again at end of session due to time constraints, but educated pt to work on repeated rolling at home before next appt. Pt able to ambulate out of session with RW with no issues and reported feeling better. Will continue per POC.      OBJECTIVE IMPAIRMENTS: Abnormal gait, decreased activity tolerance, decreased balance, difficulty walking, dizziness, and postural dysfunction.   ACTIVITY LIMITATIONS: bending, bed mobility, reach over  head, and locomotion level  PARTICIPATION LIMITATIONS: meal prep, driving, shopping, and community activity  PERSONAL FACTORS: Age, Past/current experiences, Time since onset of injury/illness/exacerbation, and 3+ comorbidities: PAD, COPD, DVT, HTN, A fib, Gout, trigeminal neuralgia, post herpetic neuralgia, OA of right knee with injections, hx of lumbar fusion, chronic diastolic heart failure  are also affecting patient's functional outcome.   REHAB POTENTIAL: Good  CLINICAL DECISION MAKING: Evolving/moderate complexity  EVALUATION COMPLEXITY: Moderate   PLAN:  PT FREQUENCY: 2x/week  PT DURATION: 4 weeks  PLANNED INTERVENTIONS: Therapeutic exercises, Therapeutic activity, Neuromuscular re-education, Balance training, Gait training, Patient/Family education, Self Care, Vestibular training, Canalith repositioning, DME instructions, and Re-evaluation  PLAN FOR NEXT SESSION: any more BPPV or dizziness spells? Progress VOR. If things good, pt can be discharged and can follow up with HHPT    Drake Leach, PT, DPT  10/11/2022, 6:51 PM

## 2022-10-13 ENCOUNTER — Ambulatory Visit: Payer: Medicare Other | Admitting: Physical Therapy

## 2022-10-13 ENCOUNTER — Encounter: Payer: Self-pay | Admitting: Physical Therapy

## 2022-10-13 DIAGNOSIS — R42 Dizziness and giddiness: Secondary | ICD-10-CM | POA: Diagnosis not present

## 2022-10-13 DIAGNOSIS — H8112 Benign paroxysmal vertigo, left ear: Secondary | ICD-10-CM

## 2022-10-13 DIAGNOSIS — R2681 Unsteadiness on feet: Secondary | ICD-10-CM | POA: Diagnosis not present

## 2022-10-13 NOTE — Therapy (Signed)
OUTPATIENT PHYSICAL THERAPY VESTIBULAR TREATMENT      Patient Name: [name] [name] MRN: [id number] DOB:Apr 01, 1925, 89 y.o., female Today's Date: 10/13/2022  END OF SESSION:  PT End of Session - 10/13/22 1313     Visit Number 7    Number of Visits 9    Date for PT Re-Evaluation 10/22/22    Authorization Type Medicare    PT Start Time 1311    PT Stop Time 1335   full time not used due to resolution of BPPV   PT Time Calculation (min) 24 min    Activity Tolerance Patient tolerated treatment well    Behavior During Therapy [facility] for tasks assessed/performed             Past Medical History:  Diagnosis Date   Acute on chronic diastolic heart failure (HCC) 12/30/2020   Allergic rhinoconjunctivitis    Allergy    Anxiety    pt. managed- uses deep breathing    Arthritis    low back , stenosis   COLONIC POLYPS, RECURRENT 08/29/2006   2008 last colonoscopy. No further colonoscopy.      COPD (chronic obstructive pulmonary disease) (HCC)    Diverticulosis    Dysrhythmia    afib   Eczema    Environmental allergies    allergy shot- q friday in Dr. Roxy Cedar office. PFT's abnormal- recommended Spiriva to use preop & will d/c after surgery   GERD (gastroesophageal reflux disease)    Hiatal hernia    Hyperlipidemia    Hypertension    Lung nodule 2011   Paroxysmal atrial fibrillation (HCC) 11/27/2019   Peripheral vascular disease (HCC)    Shingles    Stenosis of popliteal artery (HCC)    blood clots in legs long ago     Trigeminal neuralgia    Left buttocks   Past Surgical History:  Procedure Laterality Date   ABDOMINAL AORTAGRAM N/A 06/17/2011   Procedure: ABDOMINAL Ronny Flurry;  Surgeon: Sherren Kerns, MD;  Location: [facility] CATH LAB;  Service: Cardiovascular;  Laterality: N/A;   ABDOMINAL AORTOGRAM W/LOWER EXTREMITY Bilateral 06/22/2018   Procedure: ABDOMINAL AORTOGRAM W/LOWER EXTREMITY;  Surgeon: Sherren Kerns, MD;  Location: [facility] INVASIVE CV LAB;  Service: Cardiovascular;   Laterality: Bilateral;   ABDOMINAL AORTOGRAM W/LOWER EXTREMITY Bilateral 11/29/2019   Procedure: ABDOMINAL AORTOGRAM W/LOWER EXTREMITY;  Surgeon: Sherren Kerns, MD;  Location: [facility] INVASIVE CV LAB;  Service: Cardiovascular;  Laterality: Bilateral;   CATARACT EXTRACTION, BILATERAL     w IOL   CHOLECYSTECTOMY  1998   COLONOSCOPY     ENDARTERECTOMY FEMORAL Left 12/09/2019   Procedure: ENDARTERECTOMY FEMORAL with bovine patch angioplasty.;  Surgeon: Sherren Kerns, MD;  Location: [facility] OR;  Service: Vascular;  Laterality: Left;   FEMORAL-POPLITEAL BYPASS GRAFT        x2 surgeries 1990's & 2009   FEMORAL-POPLITEAL BYPASS GRAFT Right 12/09/2019   Procedure: REDO RIGHT FEMORAL-POPLITEAL ARTERY BYPASS GRAFT;  Surgeon: Sherren Kerns, MD;  Location: [facility] OR;  Service: Vascular;  Laterality: Right;   INJECTION KNEE Right Aug. 2016   Gel injection for pain   LUMBAR FUSION  07/06/2011   TONSILLECTOMY     as a teenager    TUBAL LIGATION     Patient Active Problem List   Diagnosis Date Noted   Vertigo 05/04/2022   Mild sleep apnea 05/03/2021   Chronic diastolic heart failure (HCC) 12/30/2020   Left groin hernia 05/22/2020   Anxiety state    Rectal bleeding  Ischemic colitis (HCC)    Status post femoral-popliteal bypass surgery 12/09/2019   Persistent atrial fibrillation (HCC) 12/03/2019   Secondary hypercoagulable state (HCC) 12/03/2019   Chronic bilateral low back pain with right-sided sciatica 08/29/2019   Gross hematuria 06/01/2018   CKD (chronic kidney disease), stage III (HCC) 08/07/2017   Unilateral primary osteoarthritis, right knee 04/07/2016   Acute gout of left foot 03/22/2016   Pain in right ankle and joints of right foot 08/28/2014   Trigeminal neuralgia    Syncope 12/29/2011   Chronic low back pain 09/13/2010   Allergic rhinitis due to pollen 06/25/2010   COPD mixed type (HCC) 01/22/2010   Solitary pulmonary nodule 01/12/2010   Insulin resistance 01/06/2009   Osteopenia  01/06/2009   Eczema 11/26/2007   Peripheral arterial disease (HCC) 06/29/2007   Hyperlipidemia 10/23/2006   Essential hypertension 10/23/2006   ESOPHAGEAL STRICTURE 08/09/1993    PCP: Shelva Majestic, MD  REFERRING PROVIDER:  Shelva Majestic, MD  REFERRING DIAG: R42 (ICD-10-CM) - Vertigo   THERAPY DIAG:  Unsteadiness on feet  Dizziness and giddiness  BPPV (benign paroxysmal positional vertigo), left  ONSET DATE: 08/05/2022  Rationale for Evaluation and Treatment: Rehabilitation  SUBJECTIVE:   SUBJECTIVE STATEMENT: Reports no dizziness with rolling. Had a little dizziness when looking up with the thermostat. Has been working on her computer with no issues.   Pt accompanied by: self  PERTINENT HISTORY: PMH:  PAD, COPD, DVT, HTN, A fib, Gout, trigeminal neuralgia, post herpetic neuralgia, OA of right knee with injections, hx of lumbar fusion, chronic diastolic heart failure  Per PCP on 08/05/22: :she states symptoms started back in January before hospitalization. Even sitting forward in chair might get sensation of entire head still moving when she stops and sensation that head is too heavy. Turning head quickly can cause it and loses focus. Can even happen with turning over bed- has based on be very careful with head movement  May have better days and then worse days- hard to make plans as a result   Pt hospitalized Jan 2024 with syncope/dizziness, per PT note was found to have vestibular hypofunction and L posterior canal and horizontal canal BPPV.   PAIN:  Are you having pain? No  There were no vitals filed for this visit.      PRECAUTIONS: None  WEIGHT BEARING RESTRICTIONS: No  FALLS: Has patient fallen in last 6 months? Yes. Number of falls 1  LIVING ENVIRONMENT: Lives with: lives alone, son lives close by and is over almost everyday, helps with grocery shopping  Lives in: Other Condo Stairs: No Has following equipment at home: Single point cane, Environmental consultant -  2 wheeled, Environmental consultant - 4 wheeled, shower chair, and Grab bars  PLOF: Independent with household mobility with device and Independent with community mobility with device Drives according to the way she feels.   PATIENT GOALS: Wants to be able to walk and get out and do things, walk longer distances. Wants to be more independent   OBJECTIVE:   DIAGNOSTIC FINDINGS: MRI brain 04/2022:  IMPRESSION: Normal brain MRI for age.  No acute intracranial abnormality.  COGNITION: Overall cognitive status: Within functional limits for tasks assessed GAIT: Gait pattern: step through pattern and decreased stride length Distance walked: Clinic distances  Assistive device utilized: Walker - 2 wheeled Level of assistance: SBA  VESTIBULAR TREATMENT:  DATE: 10/13/22    POSITIONAL TESTING:  Left Dix-Hallpike: upbeating, left nystagmus and lasting approx. 8 seconds  Right Roll Test: no nystagmus Left Roll Test: L upbeating rotary nystagmus with mild dizziness. Pt with no geotropic nystagmus      Canalith Repositioning:  Epley Left: Number of Reps: 1, Response to Treatment: symptoms resolved and Comment: pt only reporting dizziness in position 1 of Epley and with return to upright. With re-assessment after 1 rep, pt with no nystagmus or dizziness, indicating resolution of BPPV   Pt's son helping with rolling pt into the 3rd position of the Epley and when sitting upright   PATIENT EDUCATION: Education details: Discussed resolution of BPPV, can continue with [place] Miles exercises. Discussed will hold open pt's chart for the next 30 days in case it returns and pt needs to get back into PT. Otherwise, if not, will discharge pt and pt can get HHPT. Pt and son in agreement. Or if it comes back in future >1 month, can get a new referral from her doctor to return  Person educated: Patient  Education method:  Explanation and Demonstration Education comprehension: verbalized understanding and returned demonstration  HOME EXERCISE PROGRAM:  Seated VOR x30 seconds   Access Code: [email or web address] URL: [email or web address] Date: 10/04/2022 Prepared by: Sherlie Ban  Exercises - Brandt-Daroff Vestibular Exercise  - 2 x daily - 7 x weekly - 1 sets - 4-5 reps  GOALS: Goals reviewed with patient? Yes  SHORT TERM GOALS: ALL STGS = LTGS  LONG TERM GOALS: Target date: 10/20/2022  Pt will be independent with final HEP for vestibular deficits in order to build upon functional gains made in therapy. Baseline:  Goal status: MET  2.  Pt will improve DPS to at least 59 in order to demo improved functional outcomes related to dizziness.   Baseline: 46; 65 ON 10/11/22 Goal status: MET  3.  Pt will demo negative positional testing in order to demo resolution of L BPPV.  Baseline: L posterior canal BPPV Goal status: MET  4.  DVA to be assessed with goal written as appropriate.  Baseline: goal not needed  Goal status: N/A    ASSESSMENT:  CLINICAL IMPRESSION: Pt demonstrating resolution of horizontal canal BPPV with no geotropic nystagmus. Pt also reporting feeling better since last appt. Pt with mild L upbeating rotary nystagmus in L Dix-Hallpike for approx. 8 seconds with mild sx. Treated x1 rep of the Epley maneuver with pt demonstrating resolution of BPPV afterwards when re-assessed. Pt is pleased with how she is feeling and discussed will keep pt's chart open for the next 30 days in case BPPV returns and pt needs to come back in. Otherwise, if not, then pt will be discharged and pt plans to do HHPT for balance, as it is hard for her to get out of the house. Pt and pt's son in agreement with plan.    OBJECTIVE IMPAIRMENTS: Abnormal gait, decreased activity tolerance, decreased balance, difficulty walking, dizziness, and postural dysfunction.   ACTIVITY LIMITATIONS: bending, bed  mobility, reach over head, and locomotion level  PARTICIPATION LIMITATIONS: meal prep, driving, shopping, and community activity  PERSONAL FACTORS: Age, Past/current experiences, Time since onset of injury/illness/exacerbation, and 3+ comorbidities: PAD, COPD, DVT, HTN, A fib, Gout, trigeminal neuralgia, post herpetic neuralgia, OA of right knee with injections, hx of lumbar fusion, chronic diastolic heart failure  are also affecting patient's functional outcome.   REHAB POTENTIAL: Good  CLINICAL DECISION MAKING: Evolving/moderate complexity  EVALUATION COMPLEXITY: Moderate  PLAN:  PT FREQUENCY: 2x/week  PT DURATION: 4 weeks  PLANNED INTERVENTIONS: Therapeutic exercises, Therapeutic activity, Neuromuscular re-education, Balance training, Gait training, Patient/Family education, Self Care, Vestibular training, Canalith repositioning, DME instructions, and Re-evaluation  PLAN FOR NEXT SESSION: hold pt's chart open for the next month in case it returns    Drake Leach, PT, DPT  10/13/2022, 1:50 PM

## 2022-10-14 ENCOUNTER — Telehealth: Payer: Self-pay | Admitting: Family Medicine

## 2022-10-14 ENCOUNTER — Telehealth (HOSPITAL_BASED_OUTPATIENT_CLINIC_OR_DEPARTMENT_OTHER): Payer: Self-pay | Admitting: *Deleted

## 2022-10-14 DIAGNOSIS — J449 Chronic obstructive pulmonary disease, unspecified: Secondary | ICD-10-CM | POA: Diagnosis not present

## 2022-10-14 DIAGNOSIS — M48061 Spinal stenosis, lumbar region without neurogenic claudication: Secondary | ICD-10-CM | POA: Diagnosis not present

## 2022-10-14 DIAGNOSIS — F411 Generalized anxiety disorder: Secondary | ICD-10-CM | POA: Diagnosis not present

## 2022-10-14 DIAGNOSIS — I130 Hypertensive heart and chronic kidney disease with heart failure and stage 1 through stage 4 chronic kidney disease, or unspecified chronic kidney disease: Secondary | ICD-10-CM | POA: Diagnosis not present

## 2022-10-14 DIAGNOSIS — Z6832 Body mass index (BMI) 32.0-32.9, adult: Secondary | ICD-10-CM | POA: Diagnosis not present

## 2022-10-14 DIAGNOSIS — E875 Hyperkalemia: Secondary | ICD-10-CM | POA: Diagnosis not present

## 2022-10-14 DIAGNOSIS — Z87891 Personal history of nicotine dependence: Secondary | ICD-10-CM | POA: Diagnosis not present

## 2022-10-14 DIAGNOSIS — D6869 Other thrombophilia: Secondary | ICD-10-CM | POA: Diagnosis not present

## 2022-10-14 DIAGNOSIS — N1831 Chronic kidney disease, stage 3a: Secondary | ICD-10-CM | POA: Diagnosis not present

## 2022-10-14 DIAGNOSIS — I739 Peripheral vascular disease, unspecified: Secondary | ICD-10-CM | POA: Diagnosis not present

## 2022-10-14 DIAGNOSIS — I4819 Other persistent atrial fibrillation: Secondary | ICD-10-CM | POA: Diagnosis not present

## 2022-10-14 DIAGNOSIS — M1030 Gout due to renal impairment, unspecified site: Secondary | ICD-10-CM | POA: Diagnosis not present

## 2022-10-14 DIAGNOSIS — R7303 Prediabetes: Secondary | ICD-10-CM | POA: Diagnosis not present

## 2022-10-14 DIAGNOSIS — M5116 Intervertebral disc disorders with radiculopathy, lumbar region: Secondary | ICD-10-CM | POA: Diagnosis not present

## 2022-10-14 DIAGNOSIS — I5033 Acute on chronic diastolic (congestive) heart failure: Secondary | ICD-10-CM | POA: Diagnosis not present

## 2022-10-14 DIAGNOSIS — R5383 Other fatigue: Secondary | ICD-10-CM | POA: Diagnosis not present

## 2022-10-14 DIAGNOSIS — Z86718 Personal history of other venous thrombosis and embolism: Secondary | ICD-10-CM | POA: Diagnosis not present

## 2022-10-14 DIAGNOSIS — E785 Hyperlipidemia, unspecified: Secondary | ICD-10-CM | POA: Diagnosis not present

## 2022-10-14 DIAGNOSIS — M4726 Other spondylosis with radiculopathy, lumbar region: Secondary | ICD-10-CM | POA: Diagnosis not present

## 2022-10-14 DIAGNOSIS — E669 Obesity, unspecified: Secondary | ICD-10-CM | POA: Diagnosis not present

## 2022-10-14 DIAGNOSIS — I10 Essential (primary) hypertension: Secondary | ICD-10-CM

## 2022-10-14 DIAGNOSIS — Z7901 Long term (current) use of anticoagulants: Secondary | ICD-10-CM | POA: Diagnosis not present

## 2022-10-14 NOTE — Telephone Encounter (Signed)
When labs result will also need review of DOAC dosing given age>80 and Cr trends.

## 2022-10-14 NOTE — Telephone Encounter (Signed)
Advised patient, verbalized understanding.   Labs ordered STAT Patient does not drive but will reach out to son to see if he can bring her today. Strongly encouraged labs to be done today secondary to abnormal potassium and kidney functions

## 2022-10-14 NOTE — Telephone Encounter (Signed)
I'm ok with verbal orders but would need face to face within 30 days of [place] giving verbal orders- need to set that up before verbal orders

## 2022-10-14 NOTE — Telephone Encounter (Signed)
Verbal given.  Patient scheduled.

## 2022-10-14 NOTE — Telephone Encounter (Signed)
-----   Message from [name], PA-C sent at 10/14/2022 10:33 AM EDT ----- Covering Caitlin's inbox. Monitor done for fatigue and recent fall. Per chart review patient has been in atrial fib per EKGs going back the last few years so monitor not unsurprisingly showed atrial fib 100% of the time. Average HR is acceptable for age. There were rare skips in heartbeat but no sustained arrhythmias or pauses. Overall, no unexpected findings. Would follow up with PCP for fatigue/fall and recent lab abnormalities but otherwise keep f/u with [facility] in July as planned.  MORE TIME SENSITIVE: Also, it does look like Luther Parody had ordered a BMET on 5/24 which showed abnormal potassium and worsening kidney function. Patient was supposed to have this re-drawn within a few days but I do not see she returned to have this done. Nothing in [facility] or [facility] either - would suggest to return to have BMET drawn ASAP, update order as stat given the abnormalities by last lab and duration since this was last checked. Would also get a CBC given the fatigue and acute kidney injury at that time. Juliette Alcide, RN notified to address time sensitive result.

## 2022-10-14 NOTE — Telephone Encounter (Signed)
Home Health Verbal Orders  Agency:  [facility]  Caller: Louanne Belton and title Ph# [phone] (Secure line)-PT  Requesting  PT:    Reason for Request:  Generalized strengthening, balance and stamina  Frequency:  1 x per week for 8 weeks  HH needs F2F w/in last 30 days

## 2022-10-15 LAB — BASIC METABOLIC PANEL
BUN/Creatinine Ratio: 25 (ref 12–28)
BUN: 31 mg/dL (ref 10–36)
CO2: 23 mmol/L (ref 20–29)
Calcium: 9.5 mg/dL (ref 8.7–10.3)
Chloride: 99 mmol/L (ref 96–106)
Creatinine, Ser: 1.22 mg/dL — ABNORMAL HIGH (ref 0.57–1.00)
Glucose: 120 mg/dL — ABNORMAL HIGH (ref 70–99)
Potassium: 3.9 mmol/L (ref 3.5–5.2)
Sodium: 141 mmol/L (ref 134–144)
eGFR: 41 mL/min/{1.73_m2} — ABNORMAL LOW (ref >59)

## 2022-10-15 LAB — CBC WITH DIFFERENTIAL/PLATELET
Basophils Absolute: 0.1 10*3/uL (ref 0.0–0.2)
Basos: 1 %
EOS (ABSOLUTE): 0.2 10*3/uL (ref 0.0–0.4)
Eos: 2 %
Hematocrit: 40.4 % (ref 34.0–46.6)
Hemoglobin: 13.6 g/dL (ref 11.1–15.9)
Immature Grans (Abs): 0 10*3/uL (ref 0.0–0.1)
Immature Granulocytes: 0 %
Lymphocytes Absolute: 3 10*3/uL (ref 0.7–3.1)
Lymphs: 30 %
MCH: 29 pg (ref 26.6–33.0)
MCHC: 33.7 g/dL (ref 31.5–35.7)
MCV: 86 fL (ref 79–97)
Monocytes Absolute: 0.7 10*3/uL (ref 0.1–0.9)
Monocytes: 7 %
Neutrophils Absolute: 6.1 10*3/uL (ref 1.4–7.0)
Neutrophils: 60 %
Platelets: 175 10*3/uL (ref 150–450)
RBC: 4.69 x10E6/uL (ref 3.77–5.28)
RDW: 13.5 % (ref 11.7–15.4)
WBC: 10 10*3/uL (ref 3.4–10.8)

## 2022-10-17 ENCOUNTER — Other Ambulatory Visit: Payer: Self-pay | Admitting: Family Medicine

## 2022-10-17 ENCOUNTER — Telehealth (HOSPITAL_BASED_OUTPATIENT_CLINIC_OR_DEPARTMENT_OTHER): Payer: Self-pay

## 2022-10-17 NOTE — Telephone Encounter (Signed)
Updated labs with creatinine 1.22. Continue present dose OAC.   Alver Sorrow, NP

## 2022-10-17 NOTE — Telephone Encounter (Addendum)
Results called to patient who verbalizes understanding!    ----- Message from Alver Sorrow, NP sent at 10/17/2022  7:41 AM EDT ----- Kidney function improved from previous. Potassium has normalized. CBC with no evidence of anemia nor infection.  Good result!

## 2022-10-18 DIAGNOSIS — I4819 Other persistent atrial fibrillation: Secondary | ICD-10-CM | POA: Diagnosis not present

## 2022-10-18 DIAGNOSIS — J449 Chronic obstructive pulmonary disease, unspecified: Secondary | ICD-10-CM | POA: Diagnosis not present

## 2022-10-18 DIAGNOSIS — I739 Peripheral vascular disease, unspecified: Secondary | ICD-10-CM | POA: Diagnosis not present

## 2022-10-18 DIAGNOSIS — I5033 Acute on chronic diastolic (congestive) heart failure: Secondary | ICD-10-CM | POA: Diagnosis not present

## 2022-10-18 DIAGNOSIS — I130 Hypertensive heart and chronic kidney disease with heart failure and stage 1 through stage 4 chronic kidney disease, or unspecified chronic kidney disease: Secondary | ICD-10-CM | POA: Diagnosis not present

## 2022-10-18 DIAGNOSIS — N1831 Chronic kidney disease, stage 3a: Secondary | ICD-10-CM | POA: Diagnosis not present

## 2022-10-19 ENCOUNTER — Telehealth: Payer: Self-pay | Admitting: Family Medicine

## 2022-10-19 NOTE — Telephone Encounter (Signed)
Patient dropped off document Home Health Certificate (Order ID [id number]), to be filled out by provider. Patient requested to send it back via Fax within 5-days. Document is located in providers tray at front office.Please advise

## 2022-10-20 NOTE — Telephone Encounter (Signed)
Forms signed and faxed back.

## 2022-10-27 ENCOUNTER — Other Ambulatory Visit: Payer: Self-pay | Admitting: Family Medicine

## 2022-11-03 DIAGNOSIS — J449 Chronic obstructive pulmonary disease, unspecified: Secondary | ICD-10-CM | POA: Diagnosis not present

## 2022-11-03 DIAGNOSIS — I4819 Other persistent atrial fibrillation: Secondary | ICD-10-CM | POA: Diagnosis not present

## 2022-11-03 DIAGNOSIS — I130 Hypertensive heart and chronic kidney disease with heart failure and stage 1 through stage 4 chronic kidney disease, or unspecified chronic kidney disease: Secondary | ICD-10-CM | POA: Diagnosis not present

## 2022-11-03 DIAGNOSIS — N1831 Chronic kidney disease, stage 3a: Secondary | ICD-10-CM | POA: Diagnosis not present

## 2022-11-03 DIAGNOSIS — I5033 Acute on chronic diastolic (congestive) heart failure: Secondary | ICD-10-CM | POA: Diagnosis not present

## 2022-11-03 DIAGNOSIS — I739 Peripheral vascular disease, unspecified: Secondary | ICD-10-CM | POA: Diagnosis not present

## 2022-11-05 ENCOUNTER — Other Ambulatory Visit: Payer: Self-pay | Admitting: Family Medicine

## 2022-11-08 DIAGNOSIS — I5033 Acute on chronic diastolic (congestive) heart failure: Secondary | ICD-10-CM | POA: Diagnosis not present

## 2022-11-08 DIAGNOSIS — N1831 Chronic kidney disease, stage 3a: Secondary | ICD-10-CM | POA: Diagnosis not present

## 2022-11-08 DIAGNOSIS — J449 Chronic obstructive pulmonary disease, unspecified: Secondary | ICD-10-CM | POA: Diagnosis not present

## 2022-11-08 DIAGNOSIS — I4819 Other persistent atrial fibrillation: Secondary | ICD-10-CM | POA: Diagnosis not present

## 2022-11-08 DIAGNOSIS — I130 Hypertensive heart and chronic kidney disease with heart failure and stage 1 through stage 4 chronic kidney disease, or unspecified chronic kidney disease: Secondary | ICD-10-CM | POA: Diagnosis not present

## 2022-11-08 DIAGNOSIS — I739 Peripheral vascular disease, unspecified: Secondary | ICD-10-CM | POA: Diagnosis not present

## 2022-11-13 DIAGNOSIS — M5116 Intervertebral disc disorders with radiculopathy, lumbar region: Secondary | ICD-10-CM | POA: Diagnosis not present

## 2022-11-13 DIAGNOSIS — D6869 Other thrombophilia: Secondary | ICD-10-CM | POA: Diagnosis not present

## 2022-11-13 DIAGNOSIS — N1831 Chronic kidney disease, stage 3a: Secondary | ICD-10-CM | POA: Diagnosis not present

## 2022-11-13 DIAGNOSIS — M4726 Other spondylosis with radiculopathy, lumbar region: Secondary | ICD-10-CM | POA: Diagnosis not present

## 2022-11-13 DIAGNOSIS — Z7901 Long term (current) use of anticoagulants: Secondary | ICD-10-CM | POA: Diagnosis not present

## 2022-11-13 DIAGNOSIS — M1030 Gout due to renal impairment, unspecified site: Secondary | ICD-10-CM | POA: Diagnosis not present

## 2022-11-13 DIAGNOSIS — E785 Hyperlipidemia, unspecified: Secondary | ICD-10-CM | POA: Diagnosis not present

## 2022-11-13 DIAGNOSIS — R7303 Prediabetes: Secondary | ICD-10-CM | POA: Diagnosis not present

## 2022-11-13 DIAGNOSIS — M48061 Spinal stenosis, lumbar region without neurogenic claudication: Secondary | ICD-10-CM | POA: Diagnosis not present

## 2022-11-13 DIAGNOSIS — E669 Obesity, unspecified: Secondary | ICD-10-CM | POA: Diagnosis not present

## 2022-11-13 DIAGNOSIS — J449 Chronic obstructive pulmonary disease, unspecified: Secondary | ICD-10-CM | POA: Diagnosis not present

## 2022-11-13 DIAGNOSIS — F411 Generalized anxiety disorder: Secondary | ICD-10-CM | POA: Diagnosis not present

## 2022-11-13 DIAGNOSIS — I5033 Acute on chronic diastolic (congestive) heart failure: Secondary | ICD-10-CM | POA: Diagnosis not present

## 2022-11-13 DIAGNOSIS — I4819 Other persistent atrial fibrillation: Secondary | ICD-10-CM | POA: Diagnosis not present

## 2022-11-13 DIAGNOSIS — I739 Peripheral vascular disease, unspecified: Secondary | ICD-10-CM | POA: Diagnosis not present

## 2022-11-13 DIAGNOSIS — Z87891 Personal history of nicotine dependence: Secondary | ICD-10-CM | POA: Diagnosis not present

## 2022-11-13 DIAGNOSIS — I130 Hypertensive heart and chronic kidney disease with heart failure and stage 1 through stage 4 chronic kidney disease, or unspecified chronic kidney disease: Secondary | ICD-10-CM | POA: Diagnosis not present

## 2022-11-13 DIAGNOSIS — Z86718 Personal history of other venous thrombosis and embolism: Secondary | ICD-10-CM | POA: Diagnosis not present

## 2022-11-13 DIAGNOSIS — Z6832 Body mass index (BMI) 32.0-32.9, adult: Secondary | ICD-10-CM | POA: Diagnosis not present

## 2022-11-15 ENCOUNTER — Ambulatory Visit (INDEPENDENT_AMBULATORY_CARE_PROVIDER_SITE_OTHER): Payer: Medicare Other | Admitting: Family

## 2022-11-15 ENCOUNTER — Encounter (HOSPITAL_BASED_OUTPATIENT_CLINIC_OR_DEPARTMENT_OTHER): Payer: Self-pay | Admitting: Family

## 2022-11-15 VITALS — BP 116/72 | HR 76 | Ht 59.0 in | Wt 168.9 lb

## 2022-11-15 DIAGNOSIS — I10 Essential (primary) hypertension: Secondary | ICD-10-CM | POA: Diagnosis not present

## 2022-11-15 DIAGNOSIS — I739 Peripheral vascular disease, unspecified: Secondary | ICD-10-CM | POA: Diagnosis not present

## 2022-11-15 DIAGNOSIS — I4819 Other persistent atrial fibrillation: Secondary | ICD-10-CM

## 2022-11-15 DIAGNOSIS — I5032 Chronic diastolic (congestive) heart failure: Secondary | ICD-10-CM | POA: Diagnosis not present

## 2022-11-15 DIAGNOSIS — D6859 Other primary thrombophilia: Secondary | ICD-10-CM | POA: Diagnosis not present

## 2022-11-15 DIAGNOSIS — Z6833 Body mass index (BMI) 33.0-33.9, adult: Secondary | ICD-10-CM

## 2022-11-15 NOTE — Progress Notes (Signed)
Cardiology Office Note:  .   Date:  11/15/2022  ID:  [name] [name], DOB Sep 06, 1924, MRN [id number] PCP: Shelva Majestic, MD  [facility] Providers Cardiologist:  Chilton Si, MD    History of Present Illness: .   [name] [name] is a 89 y.o. female with a hx of hypertension, hyperlipidemia, COPD, persist atrial fibrillation, PAD s/p right fifth femoral to above-knee popliteal bypass with revision in 2010 last seen 09/16/22.   At last clinic visit 6/60/23 she was transitioned from lisinopril to [facility] half tablet of 24-26 mg twice daily due to lower extremity edema. However did not tolerate and was returned to Lisinopril. Echo ordered and performed 10/12/2021 with LVEF 65 to 70%, no RWMA, mild LVH, indeterminate diastolic parameters due to atrial fibrillation, LA mildly dilated, mild MR. She contacted the office 10/20/2021 noting feeling as if she was in fog since starting [place].  She was started on spironolactone however did not tolerate due to nausea, fuzzy brained, fatigue. Seen by Dr. Duke Salvia 01/2022 doing well from a cardiac perspective recommend follow-up in 6 months.   Admitted 1/10 - 05/10/2022 with vertigo discharged with vestibular rehab.  Discharged to SNF and eventually return home. Last seen 09/16/22 noting fall one week prior, tremors. Monitor placed to rule out pause with atrial fibrillation revealed 100% atrial fibrillation burden with well controlled rate, no pauses, 6 beats of NSVT which was asymptomatic.    She presents today for follow-up with her son [name] [name]. Reassured by results of monitor. A few weeks ago had about a week of diarrhea treated with OTC immodium and has been gaining strength back since that time. Has been improving her stamina by moving around. Recently celebrated her 63th birthday by getting a new computer. Very active in her church. Reports no shortness of breath at rest and stable mild dyspnea on exertion. Reports no chest pain, pressure, or  tightness. No edema, orthopnea, PND. Taking Lasix daily. Reports no palpitations.     Prior antihypertensives Entresto-fatigue Spironolactone-nausea, fatigue   ROS: Please see the history of present illness.    All other systems reviewed and are negative.   Studies Reviewed: .        Cardiac Studies & Procedures       ECHOCARDIOGRAM  ECHOCARDIOGRAM COMPLETE 05/05/2022  Narrative ECHOCARDIOGRAM REPORT    Patient Name:   [name] [name] Date of Exam: 05/05/2022 Medical Rec #:  [id number]        Height:       59.0 in Accession #:    [id number]       Weight:       166.0 lb Date of Birth:  08-18-1924        BSA:          1.704 m Patient Age:    89 years         BP:           120/79 mmHg Patient Gender: F                HR:           91 bpm. Exam Location:  Inpatient  Procedure: 2D Echo, 3D Echo, Cardiac Doppler and Color Doppler  Indications:    TIA G45.9  History:        Patient has prior history of Echocardiogram examinations, most recent 10/20/2021. COPD and PAD, Arrythmias:Atrial Fibrillation; Risk Factors:Former Smoker, Hypertension and Dyslipidemia.  Sonographer:    Leta Jungling RDCS Referring Phys: [id number]  DEBBY CROSLEY  IMPRESSIONS   1. Left ventricular ejection fraction, by estimation, is 65 to 70%. The left ventricle has normal function. The left ventricle has no regional wall motion abnormalities. Left ventricular diastolic parameters are indeterminate. 2. Right ventricular systolic function is low normal. The right ventricular size is normal. There is normal pulmonary artery systolic pressure. 3. The mitral valve is degenerative. Trivial mitral valve regurgitation. Mild mitral stenosis. The mean mitral valve gradient is 2.0 mmHg. 4. The aortic valve is calcified. Aortic valve regurgitation is not visualized. Aortic valve sclerosis/calcification is present, without any evidence of aortic stenosis. Aortic valve mean gradient measures 4.5 mmHg. Aortic valve Vmax  measures 1.43 m/s. 5. The inferior vena cava is normal in size with greater than 50% respiratory variability, suggesting right atrial pressure of 3 mmHg.  FINDINGS Left Ventricle: Left ventricular ejection fraction, by estimation, is 65 to 70%. The left ventricle has normal function. The left ventricle has no regional wall motion abnormalities. The left ventricular internal cavity size was normal in size. There is no left ventricular hypertrophy. Left ventricular diastolic parameters are indeterminate.  Right Ventricle: The right ventricular size is normal. No increase in right ventricular wall thickness. Right ventricular systolic function is low normal. There is normal pulmonary artery systolic pressure. The tricuspid regurgitant velocity is 2.59 m/s, and with an assumed right atrial pressure of 3 mmHg, the estimated right ventricular systolic pressure is 29.8 mmHg.  Left Atrium: Left atrial size was normal in size.  Right Atrium: Right atrial size was normal in size.  Pericardium: There is no evidence of pericardial effusion. Presence of epicardial fat layer.  Mitral Valve: The mitral valve is degenerative in appearance. There is moderate thickening of the mitral valve leaflet(s). There is moderate calcification of the mitral valve leaflet(s). Moderately decreased mobility of the mitral valve leaflets. Mild to moderate mitral annular calcification. Trivial mitral valve regurgitation. Mild mitral valve stenosis. MV peak gradient, 6.3 mmHg. The mean mitral valve gradient is 2.0 mmHg.  Tricuspid Valve: The tricuspid valve is grossly normal. Tricuspid valve regurgitation is mild . No evidence of tricuspid stenosis.  Aortic Valve: The aortic valve is calcified. Aortic valve regurgitation is not visualized. Aortic valve sclerosis/calcification is present, without any evidence of aortic stenosis. Aortic valve mean gradient measures 4.5 mmHg. Aortic valve peak gradient measures 8.2 mmHg.  Pulmonic  Valve: The pulmonic valve was not well visualized. Pulmonic valve regurgitation is not visualized. No evidence of pulmonic stenosis.  Aorta: The aortic root and ascending aorta are structurally normal, with no evidence of dilitation.  Venous: The inferior vena cava is normal in size with greater than 50% respiratory variability, suggesting right atrial pressure of 3 mmHg.  IAS/Shunts: No atrial level shunt detected by color flow Doppler.   LEFT VENTRICLE PLAX 2D LVIDd:         4.20 cm   Diastology LVIDs:         2.00 cm   LV e' medial:    3.87 cm/s LV PW:         0.90 cm   LV E/e' medial:  34.2 LV IVS:        1.00 cm   LV e' lateral:   6.55 cm/s LVOT diam:     1.60 cm   LV E/e' lateral: 20.2 LVOT Area:     2.01 cm  3D Volume EF: 3D EF:        54 % LV EDV:       53  ml LV ESV:       24 ml LV SV:        29 ml  RIGHT VENTRICLE RV S prime:     8.11 cm/s TAPSE (M-mode): 1.2 cm  LEFT ATRIUM             Index        RIGHT ATRIUM           Index LA diam:        4.20 cm 2.46 cm/m   RA Area:     11.00 cm LA Vol (A2C):   38.1 ml 22.36 ml/m  RA Volume:   19.70 ml  11.56 ml/m LA Vol (A4C):   45.2 ml 26.53 ml/m LA Biplane Vol: 43.5 ml 25.53 ml/m AORTIC VALVE AV Vmax:      143.50 cm/s AV Vmean:     100.170 cm/s AV VTI:       0.302 m AV Peak Grad: 8.2 mmHg AV Mean Grad: 4.5 mmHg  AORTA Ao Root diam: 3.30 cm Ao Asc diam:  3.20 cm  MITRAL VALVE                TRICUSPID VALVE MV Area (PHT): 4.12 cm     TR Peak grad:   26.8 mmHg MV Peak grad:  6.3 mmHg     TR Vmax:        259.00 cm/s MV Mean grad:  2.0 mmHg MV Vmax:       1.25 m/s     SHUNTS MV Vmean:      61.5 cm/s    Systemic Diam: 1.60 cm MV Decel Time: 184 msec MV E velocity: 132.33 cm/s  Dietrich Pates MD Electronically signed by Dietrich Pates MD Signature Date/Time: 05/05/2022/9:19:35 AM    Final    MONITORS  LONG TERM MONITOR (3-14 DAYS) 10/06/2022  Narrative 13 Day Zio Monitor  Quality: Fair.  Baseline  artifact. Predominant rhythm: atrial fibrillation noted continuously Average heart rate: 85 bpm Max heart rate: 140 bpm Min heart rate: 48 bpm Pauses >2.5 seconds: none  Rare (<1%) PVCs 6 beats NSVT  Tiffany C. Duke Salvia, MD, [facility] 10/14/2022 9:44 AM           Risk Assessment/Calculations:    CHA2DS2-VASc Score = 6   This indicates a 9.7% annual risk of stroke. The patient's score is based upon: CHF History: 1 HTN History: 1 Diabetes History: 0 Stroke History: 0 Vascular Disease History: 1 Age Score: 2 Gender Score: 1            Physical Exam:   VS:  BP 116/72 (BP Location: Left Arm, Patient Position: Sitting, Cuff Size: Large)   Pulse 76   Ht 4\' 11"  (1.499 m)   Wt 168 lb 14.4 oz (76.6 kg)   SpO2 95%   BMI 34.11 kg/m    Wt Readings from Last 3 Encounters:  11/15/22 168 lb 14.4 oz (76.6 kg)  09/16/22 165 lb (74.8 kg)  08/26/22 167 lb 12.8 oz (76.1 kg)    GEN: Well nourished, well developed in no acute distress NECK: No JVD; No carotid bruits CARDIAC: IRIR, no murmurs, rubs, gallops RESPIRATORY:  Clear to auscultation without rales, wheezing or rhonchi  ABDOMEN: Soft, non-tender, non-distended EXTREMITIES:  No edema; No deformity   ASSESSMENT AND PLAN: .   HTN - BP well controlled. Continue current antihypertensive regimen lisinopril 5 mg nightly, Toprol 100 mg daily, Lasix 40 mg daily with additional 40 mg in the afternoon on Wednesdays.  Did not tolerate Entresto due to fatigue.   Chronic diastolic heart failure /lower extremity edema/mild mitral stenosis-echo 04/2022 normal LVEF 65 to 70%, no RWMA, diastolic function indeterminate, mild mitral stenosis.  Previously intolerant to Entresto, spironolactone.  Encouraged low-sodium diet and restriction of less than 2 L of fluid per day. Lower extremity elevation as well as compression socks encouraged.  Continue Lasix 40 mg daily.  She may take an extra tablet as needed for weight gain of 2 pounds overnight or 5 pounds  in 1 week.    Permanent atrial fibrillation /hypercoagulable state -rate controlled auscultation today.  Recent monitor with 100% burden with controlled rate. Continue Toprol 100 mg daily.  Continue Eliquis 5 mg twice daily.  Careful monitoring of renal function to ensure creatinine remains less than 1.5. CHA2DS2-VASc Score = 6 [CHF History: 1, HTN History: 1, Diabetes History: 0, Stroke History: 0, Vascular Disease History: 1, Age Score: 2, Gender Score: 1].  Therefore, the patient's annual risk of stroke is 9.7 %.     BMI 33 - Weight loss via diet and exercise encouraged. Discussed the impact being overweight would have on cardiovascular risk.   GERD / Hiatal hernia - Continue to follow with PCP.    PAD -s/p redo right femoropopliteal bypass 11/2019.  Follows with [facility].  Denies claudication symptoms. No aspirin due to [facility].Continue Atorvastatin.        Dispo: follow up in 6 months  Signed, Alver Sorrow, NP

## 2022-11-15 NOTE — Patient Instructions (Signed)
Medication Instructions:  Your physician recommends that you continue on your current medications as directed. Please refer to the Current Medication list given to you today.  *If you need a refill on your cardiac medications before your next appointment, please call your pharmacy*   Follow-Up: At [facility], you and your health needs are our priority.  As part of our continuing mission to provide you with exceptional heart care, we have created designated Provider Care Teams.  These Care Teams include your primary Cardiologist (physician) and Advanced Practice Providers (APPs -  Physician Assistants and Nurse Practitioners) who all work together to provide you with the care you need, when you need it.  We recommend signing up for the patient portal called "MyChart".  Sign up information is provided on this After Visit Summary.  MyChart is used to connect with patients for Virtual Visits (Telemedicine).  Patients are able to view lab/test results, encounter notes, upcoming appointments, etc.  Non-urgent messages can be sent to your provider as well.   To learn more about what you can do with MyChart, go to [email or web address].    Your next appointment:   6 months Dr. Duke Salvia or Gillian Shields, NP

## 2022-11-18 DIAGNOSIS — I4819 Other persistent atrial fibrillation: Secondary | ICD-10-CM | POA: Diagnosis not present

## 2022-11-18 DIAGNOSIS — I5033 Acute on chronic diastolic (congestive) heart failure: Secondary | ICD-10-CM | POA: Diagnosis not present

## 2022-11-18 DIAGNOSIS — N1831 Chronic kidney disease, stage 3a: Secondary | ICD-10-CM | POA: Diagnosis not present

## 2022-11-18 DIAGNOSIS — I130 Hypertensive heart and chronic kidney disease with heart failure and stage 1 through stage 4 chronic kidney disease, or unspecified chronic kidney disease: Secondary | ICD-10-CM | POA: Diagnosis not present

## 2022-11-18 DIAGNOSIS — J449 Chronic obstructive pulmonary disease, unspecified: Secondary | ICD-10-CM | POA: Diagnosis not present

## 2022-11-18 DIAGNOSIS — I739 Peripheral vascular disease, unspecified: Secondary | ICD-10-CM | POA: Diagnosis not present

## 2022-11-21 ENCOUNTER — Other Ambulatory Visit (HOSPITAL_BASED_OUTPATIENT_CLINIC_OR_DEPARTMENT_OTHER): Payer: Self-pay | Admitting: Cardiovascular Disease

## 2022-11-21 ENCOUNTER — Other Ambulatory Visit: Payer: Self-pay | Admitting: Family Medicine

## 2022-11-21 NOTE — Telephone Encounter (Signed)
Rx request sent to pharmacy.  

## 2022-11-23 DIAGNOSIS — N1831 Chronic kidney disease, stage 3a: Secondary | ICD-10-CM | POA: Diagnosis not present

## 2022-11-23 DIAGNOSIS — I5033 Acute on chronic diastolic (congestive) heart failure: Secondary | ICD-10-CM | POA: Diagnosis not present

## 2022-11-23 DIAGNOSIS — I739 Peripheral vascular disease, unspecified: Secondary | ICD-10-CM | POA: Diagnosis not present

## 2022-11-23 DIAGNOSIS — J449 Chronic obstructive pulmonary disease, unspecified: Secondary | ICD-10-CM | POA: Diagnosis not present

## 2022-11-23 DIAGNOSIS — I130 Hypertensive heart and chronic kidney disease with heart failure and stage 1 through stage 4 chronic kidney disease, or unspecified chronic kidney disease: Secondary | ICD-10-CM | POA: Diagnosis not present

## 2022-11-23 DIAGNOSIS — I4819 Other persistent atrial fibrillation: Secondary | ICD-10-CM | POA: Diagnosis not present

## 2022-11-28 ENCOUNTER — Encounter: Payer: Self-pay | Admitting: Family Medicine

## 2022-11-28 ENCOUNTER — Ambulatory Visit: Payer: Medicare Other | Admitting: Family Medicine

## 2022-11-28 VITALS — BP 110/80 | HR 70 | Temp 97.2°F | Ht 59.0 in | Wt 166.2 lb

## 2022-11-28 DIAGNOSIS — I10 Essential (primary) hypertension: Secondary | ICD-10-CM | POA: Diagnosis not present

## 2022-11-28 DIAGNOSIS — E7800 Pure hypercholesterolemia, unspecified: Secondary | ICD-10-CM

## 2022-11-28 DIAGNOSIS — N183 Chronic kidney disease, stage 3 unspecified: Secondary | ICD-10-CM | POA: Diagnosis not present

## 2022-11-28 DIAGNOSIS — I5032 Chronic diastolic (congestive) heart failure: Secondary | ICD-10-CM | POA: Diagnosis not present

## 2022-11-28 DIAGNOSIS — I4819 Other persistent atrial fibrillation: Secondary | ICD-10-CM | POA: Diagnosis not present

## 2022-11-28 MED ORDER — PANTOPRAZOLE SODIUM 40 MG PO TBEC: DELAYED_RELEASE_TABLET | ORAL | 3 refills | Status: DC

## 2022-11-28 NOTE — Patient Instructions (Addendum)
Let us know if you get your Flu or covid vaccine at your pharmacy.  Since you had labs in late June- you wanted to wait for labs next visit- that is reasonable and we will check next visit  Glad you are feeling better!   Recommended follow up: Return in about 6 months (around 05/31/2023) for followup or sooner if needed.Schedule b4 you leave.

## 2022-11-28 NOTE — Progress Notes (Signed)
Phone [phone] In person visit   Subjective:   [name] [name] is a 89 y.o. year old very pleasant female patient who presents for/with See problem oriented charting Chief Complaint  Patient presents with   Congestive Heart Failure    Past Medical History-  Patient Active Problem List   Diagnosis Date Noted   Chronic diastolic heart failure (HCC) 12/30/2020    Priority: High   Status post femoral-popliteal bypass surgery 12/09/2019    Priority: High   Persistent atrial fibrillation (HCC) 12/03/2019    Priority: High   Pain in right ankle and joints of right foot 08/28/2014    Priority: High   Peripheral arterial disease (HCC) 06/29/2007    Priority: High   Vertigo 05/04/2022    Priority: Medium    Left groin hernia 05/22/2020    Priority: Medium    CKD (chronic kidney disease), stage III (HCC) 08/07/2017    Priority: Medium    Acute gout of left foot 03/22/2016    Priority: Medium    Trigeminal neuralgia     Priority: Medium    Chronic low back pain 09/13/2010    Priority: Medium    COPD mixed type (HCC) 01/22/2010    Priority: Medium    Insulin resistance 01/06/2009    Priority: Medium    Hyperlipidemia 10/23/2006    Priority: Medium    Essential hypertension 10/23/2006    Priority: Medium    ESOPHAGEAL STRICTURE 08/09/1993    Priority: Medium    Secondary hypercoagulable state (HCC) 12/03/2019    Priority: Low   Syncope 12/29/2011    Priority: Low   Allergic rhinitis due to pollen 06/25/2010    Priority: Low   Solitary pulmonary nodule 01/12/2010    Priority: Low   Osteopenia 01/06/2009    Priority: Low   Eczema 11/26/2007    Priority: Low   Chronic bilateral low back pain with right-sided sciatica 08/29/2019    Priority: 1.   Unilateral primary osteoarthritis, right knee 04/07/2016    Priority: 1.   Mild sleep apnea 05/03/2021   Anxiety state    Rectal bleeding    Ischemic colitis (HCC)    Gross hematuria 06/01/2018    Medications-  reviewed and updated Current Outpatient Medications  Medication Sig Dispense Refill   acetaminophen (TYLENOL) 500 MG tablet Take 2 tablets (1,000 mg total) by mouth 2 (two) times daily. 30 tablet 0   apixaban (ELIQUIS) 5 MG TABS tablet Take 1 tablet by mouth twice daily 180 tablet 1   atorvastatin (LIPITOR) 40 MG tablet Take 1 tablet by mouth once daily 90 tablet 0   diclofenac Sodium (VOLTAREN) 1 % GEL Apply 2 g topically 4 (four) times daily.     famotidine (PEPCID) 20 MG tablet Take 20mg  in the evening for 1 week. Then take as needed for indigestion. 30 tablet 2   furosemide (LASIX) 40 MG tablet Take 1 tablet by mouth once daily 90 tablet 1   ipratropium (ATROVENT) 0.06 % nasal spray USE 2 SPRAY(S) IN EACH NOSTRIL TWICE DAILY 15 mL 0   lidocaine (LIDODERM) 5 % Place 1 patch onto the skin daily. Remove & Discard patch within 12 hours or as directed by MD 90 patch 3   meclizine (ANTIVERT) 25 MG tablet TAKE 1/2 (ONE-HALF) TABLET BY MOUTH THREE TIMES DAILY AS NEEDED FOR VERTIGO 30 tablet 0   melatonin 3 MG TABS tablet Take 2 tablets (6 mg total) by mouth at bedtime as needed (sleep).  0  metoprolol succinate (TOPROL-XL) 100 MG 24 hr tablet TAKE 1 TABLET BY MOUTH ONCE DAILY. TAKE WITH OR IMMEDIATELY FOLLOWING A MEAL 90 tablet 1   Multiple Vitamin (MULTIVITAMIN WITH MINERALS) TABS tablet Take 1 tablet by mouth daily.     RESTASIS 0.05 % ophthalmic emulsion Place 1 drop into both eyes 2 (two) times daily as needed (dry eyes).   99   senna-docusate (SENOKOT-S) 8.6-50 MG tablet Take 1 tablet by mouth at bedtime as needed for mild constipation.     lisinopril (ZESTRIL) 5 MG tablet Take 1 tablet (5 mg total) by mouth daily. 90 tablet 3   pantoprazole (PROTONIX) 40 MG tablet TAKE 1 TABLET BY MOUTH TWICE DAILY AS DIRECTED 180 tablet 3   No current facility-administered medications for this visit.     Objective:  BP 110/80   Pulse 70   Temp (!) 97.2 F (36.2 C)   Ht 4\' 11"  (1.499 m)   Wt 166 lb  3.2 oz (75.4 kg)   SpO2 96%   BMI 33.57 kg/m  Gen: NAD, resting comfortably CV: irregularly irregular  Lungs: CTAB no crackles, wheeze, rhonchi Ext: trace edema Skin: warm, dry     Assessment and Plan   #social update- was even able to drive and get into the office herself today. Enjoyed her 89th birthday. Makes bed and clothes herself before leaves bedroom - recent monovisc helped recently and usually good for 6 months -still working with PM&right and capsaicin/chilli high dose packs  -no falls since last visit -prior fatigue has improved since eating healthier more robust meals and getting more protein -she thinks prior vertigo was an issue with fatigue- they did rehab at home and that was helpful- sounds like still working with her  #PAD-actually on warfarin for arterial thrombosis in the past now on Eliquis for A. fib as well. Dr. Darrick Penna (now Dr. Chestine Spore) ok with compression stockings #Hyperlipidemia  S: Compliant with atorvastatin 40 mg.  LDL goal 70 or less due to arterial disease. -denies recent claudication Lab Results  Component Value Date   CHOL 106 05/06/2022   HDL 46 05/06/2022   LDLCALC 37 05/06/2022   LDLDIRECT 46.0 06/10/2019   TRIG 116 05/06/2022   CHOLHDL 2.3 05/06/2022  A/P: cholesterol has been well controlled - continue current medications  Peripheral arterial disease- reports no claudication- continue risk factor modification  # Atrial fibrillation S: Rate controlled with metoprolol 100 mg extended release Anticoagulated with Eliquis 5 mg twice daily- reevaluate dose if cr raises above 1.5  - last creatinine 1.22  A/P: appropriately anticoagulated and rate controlled- continue current medicine   #Diastolic CHF- sees dr. Duke Salvia #Essential hypertension S: medication: Lisinopril 5 mg, metoprolol 100 mg XR and lasix 40 mg daily prn-in 2024 taking daily- takes extra dose on Wednesday- stable on this  Edema: none  Weight gain: none- actually down 1  lb  A/P: CHF well controlled continue current medications. She is trying to eat more complete meals Hypertension at goal continue current medications     #Chronic kidney disease stage III S: Patient's GFR has been stable in the 30s in 2023.  She knows to avoid NSAIDs A/P:did have a recent bump in creatinine but trended back down thankfully with last GFR actually at 41   #Insulin resistance/obesity/prediabetes- peak a1c 6.2 in 2021 S:  trying to eat healthier meals Lab Results  Component Value Date   HGBA1C 6.0 (H) 05/05/2022   HGBA1C 6.1 08/20/2020   HGBA1C 5.6 02/19/2020  A/P: want to check a1c but shed prefer to have labs next visit as just had labs in June with cardiology   # GERD with history of stricture S:Medication: pantoprazole 40 mg twice daily   B12 levels related to PPI use: Lab Results  Component Value Date   VITAMINB12 444 08/20/2020  A/P: doing well- continue current medications - consider recheck B12 next visit with high dose proton pump inhibitor (PPI) stomach acid reducer   Recommended follow up: Return in about 6 months (around 05/31/2023) for followup or sooner if needed.Schedule b4 you leave. Future Appointments  Date Time Provider Department Center  02/10/2023  1:00 PM [facility] ANNUAL WELLNESS VISIT 1 [facility]   Lab/Order associations:   ICD-10-CM   1. Persistent atrial fibrillation (HCC)  I48.19     2. Chronic diastolic heart failure (HCC)  S06.30     3. Essential hypertension  I10     4. Pure hypercholesterolemia  E78.00     5. Stage 3 chronic kidney disease, unspecified whether stage 3a or 3b CKD (HCC)  N18.30      Meds ordered this encounter  Medications   pantoprazole (PROTONIX) 40 MG tablet    Sig: TAKE 1 TABLET BY MOUTH TWICE DAILY AS DIRECTED    Dispense:  180 tablet    Refill:  3   Return precautions advised.  Tana Conch, MD

## 2022-11-30 DIAGNOSIS — N1831 Chronic kidney disease, stage 3a: Secondary | ICD-10-CM | POA: Diagnosis not present

## 2022-11-30 DIAGNOSIS — I739 Peripheral vascular disease, unspecified: Secondary | ICD-10-CM | POA: Diagnosis not present

## 2022-11-30 DIAGNOSIS — I4819 Other persistent atrial fibrillation: Secondary | ICD-10-CM | POA: Diagnosis not present

## 2022-11-30 DIAGNOSIS — J449 Chronic obstructive pulmonary disease, unspecified: Secondary | ICD-10-CM | POA: Diagnosis not present

## 2022-11-30 DIAGNOSIS — I130 Hypertensive heart and chronic kidney disease with heart failure and stage 1 through stage 4 chronic kidney disease, or unspecified chronic kidney disease: Secondary | ICD-10-CM | POA: Diagnosis not present

## 2022-11-30 DIAGNOSIS — I5033 Acute on chronic diastolic (congestive) heart failure: Secondary | ICD-10-CM | POA: Diagnosis not present

## 2022-12-09 DIAGNOSIS — I739 Peripheral vascular disease, unspecified: Secondary | ICD-10-CM | POA: Diagnosis not present

## 2022-12-09 DIAGNOSIS — I4819 Other persistent atrial fibrillation: Secondary | ICD-10-CM | POA: Diagnosis not present

## 2022-12-09 DIAGNOSIS — I5033 Acute on chronic diastolic (congestive) heart failure: Secondary | ICD-10-CM | POA: Diagnosis not present

## 2022-12-09 DIAGNOSIS — J449 Chronic obstructive pulmonary disease, unspecified: Secondary | ICD-10-CM | POA: Diagnosis not present

## 2022-12-09 DIAGNOSIS — I130 Hypertensive heart and chronic kidney disease with heart failure and stage 1 through stage 4 chronic kidney disease, or unspecified chronic kidney disease: Secondary | ICD-10-CM | POA: Diagnosis not present

## 2022-12-09 DIAGNOSIS — N1831 Chronic kidney disease, stage 3a: Secondary | ICD-10-CM | POA: Diagnosis not present

## 2022-12-13 DIAGNOSIS — M5116 Intervertebral disc disorders with radiculopathy, lumbar region: Secondary | ICD-10-CM | POA: Diagnosis not present

## 2022-12-13 DIAGNOSIS — Z86718 Personal history of other venous thrombosis and embolism: Secondary | ICD-10-CM | POA: Diagnosis not present

## 2022-12-13 DIAGNOSIS — F411 Generalized anxiety disorder: Secondary | ICD-10-CM | POA: Diagnosis not present

## 2022-12-13 DIAGNOSIS — E88819 Insulin resistance, unspecified: Secondary | ICD-10-CM | POA: Diagnosis not present

## 2022-12-13 DIAGNOSIS — M4726 Other spondylosis with radiculopathy, lumbar region: Secondary | ICD-10-CM | POA: Diagnosis not present

## 2022-12-13 DIAGNOSIS — I739 Peripheral vascular disease, unspecified: Secondary | ICD-10-CM | POA: Diagnosis not present

## 2022-12-13 DIAGNOSIS — E669 Obesity, unspecified: Secondary | ICD-10-CM | POA: Diagnosis not present

## 2022-12-13 DIAGNOSIS — M1030 Gout due to renal impairment, unspecified site: Secondary | ICD-10-CM | POA: Diagnosis not present

## 2022-12-13 DIAGNOSIS — Z7901 Long term (current) use of anticoagulants: Secondary | ICD-10-CM | POA: Diagnosis not present

## 2022-12-13 DIAGNOSIS — I130 Hypertensive heart and chronic kidney disease with heart failure and stage 1 through stage 4 chronic kidney disease, or unspecified chronic kidney disease: Secondary | ICD-10-CM | POA: Diagnosis not present

## 2022-12-13 DIAGNOSIS — D6869 Other thrombophilia: Secondary | ICD-10-CM | POA: Diagnosis not present

## 2022-12-13 DIAGNOSIS — Z6832 Body mass index (BMI) 32.0-32.9, adult: Secondary | ICD-10-CM | POA: Diagnosis not present

## 2022-12-13 DIAGNOSIS — N1831 Chronic kidney disease, stage 3a: Secondary | ICD-10-CM | POA: Diagnosis not present

## 2022-12-13 DIAGNOSIS — R7303 Prediabetes: Secondary | ICD-10-CM | POA: Diagnosis not present

## 2022-12-13 DIAGNOSIS — I4819 Other persistent atrial fibrillation: Secondary | ICD-10-CM | POA: Diagnosis not present

## 2022-12-13 DIAGNOSIS — J449 Chronic obstructive pulmonary disease, unspecified: Secondary | ICD-10-CM | POA: Diagnosis not present

## 2022-12-13 DIAGNOSIS — I5032 Chronic diastolic (congestive) heart failure: Secondary | ICD-10-CM | POA: Diagnosis not present

## 2022-12-13 DIAGNOSIS — Z87891 Personal history of nicotine dependence: Secondary | ICD-10-CM | POA: Diagnosis not present

## 2022-12-13 DIAGNOSIS — M48061 Spinal stenosis, lumbar region without neurogenic claudication: Secondary | ICD-10-CM | POA: Diagnosis not present

## 2022-12-13 DIAGNOSIS — E785 Hyperlipidemia, unspecified: Secondary | ICD-10-CM | POA: Diagnosis not present

## 2022-12-14 DIAGNOSIS — I5032 Chronic diastolic (congestive) heart failure: Secondary | ICD-10-CM | POA: Diagnosis not present

## 2022-12-14 DIAGNOSIS — I739 Peripheral vascular disease, unspecified: Secondary | ICD-10-CM | POA: Diagnosis not present

## 2022-12-14 DIAGNOSIS — J449 Chronic obstructive pulmonary disease, unspecified: Secondary | ICD-10-CM | POA: Diagnosis not present

## 2022-12-14 DIAGNOSIS — N1831 Chronic kidney disease, stage 3a: Secondary | ICD-10-CM | POA: Diagnosis not present

## 2022-12-14 DIAGNOSIS — I4819 Other persistent atrial fibrillation: Secondary | ICD-10-CM | POA: Diagnosis not present

## 2022-12-14 DIAGNOSIS — I130 Hypertensive heart and chronic kidney disease with heart failure and stage 1 through stage 4 chronic kidney disease, or unspecified chronic kidney disease: Secondary | ICD-10-CM | POA: Diagnosis not present

## 2022-12-20 DIAGNOSIS — I5032 Chronic diastolic (congestive) heart failure: Secondary | ICD-10-CM | POA: Diagnosis not present

## 2022-12-20 DIAGNOSIS — J449 Chronic obstructive pulmonary disease, unspecified: Secondary | ICD-10-CM | POA: Diagnosis not present

## 2022-12-20 DIAGNOSIS — I4819 Other persistent atrial fibrillation: Secondary | ICD-10-CM | POA: Diagnosis not present

## 2022-12-20 DIAGNOSIS — I130 Hypertensive heart and chronic kidney disease with heart failure and stage 1 through stage 4 chronic kidney disease, or unspecified chronic kidney disease: Secondary | ICD-10-CM | POA: Diagnosis not present

## 2022-12-20 DIAGNOSIS — I739 Peripheral vascular disease, unspecified: Secondary | ICD-10-CM | POA: Diagnosis not present

## 2022-12-20 DIAGNOSIS — N1831 Chronic kidney disease, stage 3a: Secondary | ICD-10-CM | POA: Diagnosis not present

## 2022-12-27 DIAGNOSIS — I130 Hypertensive heart and chronic kidney disease with heart failure and stage 1 through stage 4 chronic kidney disease, or unspecified chronic kidney disease: Secondary | ICD-10-CM | POA: Diagnosis not present

## 2022-12-27 DIAGNOSIS — I739 Peripheral vascular disease, unspecified: Secondary | ICD-10-CM | POA: Diagnosis not present

## 2022-12-27 DIAGNOSIS — I5032 Chronic diastolic (congestive) heart failure: Secondary | ICD-10-CM | POA: Diagnosis not present

## 2022-12-27 DIAGNOSIS — I4819 Other persistent atrial fibrillation: Secondary | ICD-10-CM | POA: Diagnosis not present

## 2022-12-27 DIAGNOSIS — N1831 Chronic kidney disease, stage 3a: Secondary | ICD-10-CM | POA: Diagnosis not present

## 2022-12-27 DIAGNOSIS — J449 Chronic obstructive pulmonary disease, unspecified: Secondary | ICD-10-CM | POA: Diagnosis not present

## 2023-01-03 ENCOUNTER — Other Ambulatory Visit: Payer: Self-pay | Admitting: Family Medicine

## 2023-01-03 DIAGNOSIS — I739 Peripheral vascular disease, unspecified: Secondary | ICD-10-CM | POA: Diagnosis not present

## 2023-01-03 DIAGNOSIS — I4819 Other persistent atrial fibrillation: Secondary | ICD-10-CM | POA: Diagnosis not present

## 2023-01-03 DIAGNOSIS — I5032 Chronic diastolic (congestive) heart failure: Secondary | ICD-10-CM | POA: Diagnosis not present

## 2023-01-03 DIAGNOSIS — I130 Hypertensive heart and chronic kidney disease with heart failure and stage 1 through stage 4 chronic kidney disease, or unspecified chronic kidney disease: Secondary | ICD-10-CM | POA: Diagnosis not present

## 2023-01-03 DIAGNOSIS — J449 Chronic obstructive pulmonary disease, unspecified: Secondary | ICD-10-CM | POA: Diagnosis not present

## 2023-01-03 DIAGNOSIS — N1831 Chronic kidney disease, stage 3a: Secondary | ICD-10-CM | POA: Diagnosis not present

## 2023-01-09 DIAGNOSIS — I4819 Other persistent atrial fibrillation: Secondary | ICD-10-CM | POA: Diagnosis not present

## 2023-01-09 DIAGNOSIS — I739 Peripheral vascular disease, unspecified: Secondary | ICD-10-CM | POA: Diagnosis not present

## 2023-01-09 DIAGNOSIS — J449 Chronic obstructive pulmonary disease, unspecified: Secondary | ICD-10-CM | POA: Diagnosis not present

## 2023-01-09 DIAGNOSIS — I130 Hypertensive heart and chronic kidney disease with heart failure and stage 1 through stage 4 chronic kidney disease, or unspecified chronic kidney disease: Secondary | ICD-10-CM | POA: Diagnosis not present

## 2023-01-09 DIAGNOSIS — N1831 Chronic kidney disease, stage 3a: Secondary | ICD-10-CM | POA: Diagnosis not present

## 2023-01-09 DIAGNOSIS — I5032 Chronic diastolic (congestive) heart failure: Secondary | ICD-10-CM | POA: Diagnosis not present

## 2023-01-10 DIAGNOSIS — Z23 Encounter for immunization: Secondary | ICD-10-CM | POA: Diagnosis not present

## 2023-01-12 DIAGNOSIS — M1030 Gout due to renal impairment, unspecified site: Secondary | ICD-10-CM | POA: Diagnosis not present

## 2023-01-12 DIAGNOSIS — I739 Peripheral vascular disease, unspecified: Secondary | ICD-10-CM | POA: Diagnosis not present

## 2023-01-12 DIAGNOSIS — R7303 Prediabetes: Secondary | ICD-10-CM | POA: Diagnosis not present

## 2023-01-12 DIAGNOSIS — I130 Hypertensive heart and chronic kidney disease with heart failure and stage 1 through stage 4 chronic kidney disease, or unspecified chronic kidney disease: Secondary | ICD-10-CM | POA: Diagnosis not present

## 2023-01-12 DIAGNOSIS — Z87891 Personal history of nicotine dependence: Secondary | ICD-10-CM | POA: Diagnosis not present

## 2023-01-12 DIAGNOSIS — E88819 Insulin resistance, unspecified: Secondary | ICD-10-CM | POA: Diagnosis not present

## 2023-01-12 DIAGNOSIS — M4726 Other spondylosis with radiculopathy, lumbar region: Secondary | ICD-10-CM | POA: Diagnosis not present

## 2023-01-12 DIAGNOSIS — D6869 Other thrombophilia: Secondary | ICD-10-CM | POA: Diagnosis not present

## 2023-01-12 DIAGNOSIS — E669 Obesity, unspecified: Secondary | ICD-10-CM | POA: Diagnosis not present

## 2023-01-12 DIAGNOSIS — Z6832 Body mass index (BMI) 32.0-32.9, adult: Secondary | ICD-10-CM | POA: Diagnosis not present

## 2023-01-12 DIAGNOSIS — M48061 Spinal stenosis, lumbar region without neurogenic claudication: Secondary | ICD-10-CM | POA: Diagnosis not present

## 2023-01-12 DIAGNOSIS — J449 Chronic obstructive pulmonary disease, unspecified: Secondary | ICD-10-CM | POA: Diagnosis not present

## 2023-01-12 DIAGNOSIS — I5032 Chronic diastolic (congestive) heart failure: Secondary | ICD-10-CM | POA: Diagnosis not present

## 2023-01-12 DIAGNOSIS — Z86718 Personal history of other venous thrombosis and embolism: Secondary | ICD-10-CM | POA: Diagnosis not present

## 2023-01-12 DIAGNOSIS — I4819 Other persistent atrial fibrillation: Secondary | ICD-10-CM | POA: Diagnosis not present

## 2023-01-12 DIAGNOSIS — M5116 Intervertebral disc disorders with radiculopathy, lumbar region: Secondary | ICD-10-CM | POA: Diagnosis not present

## 2023-01-12 DIAGNOSIS — N1831 Chronic kidney disease, stage 3a: Secondary | ICD-10-CM | POA: Diagnosis not present

## 2023-01-12 DIAGNOSIS — F411 Generalized anxiety disorder: Secondary | ICD-10-CM | POA: Diagnosis not present

## 2023-01-12 DIAGNOSIS — E785 Hyperlipidemia, unspecified: Secondary | ICD-10-CM | POA: Diagnosis not present

## 2023-01-12 DIAGNOSIS — Z7901 Long term (current) use of anticoagulants: Secondary | ICD-10-CM | POA: Diagnosis not present

## 2023-01-13 ENCOUNTER — Telehealth: Payer: Self-pay | Admitting: Family Medicine

## 2023-01-13 NOTE — Telephone Encounter (Signed)
Sent by fax, document Home Health Certificate (Order ID [id number]), to be filled out by provider. Patient requested to send it back via Fax within 5-days. Document is located in providers tray at front office.Please advise

## 2023-01-16 ENCOUNTER — Encounter: Payer: Medicare Other | Admitting: Pharmacist

## 2023-01-16 DIAGNOSIS — I130 Hypertensive heart and chronic kidney disease with heart failure and stage 1 through stage 4 chronic kidney disease, or unspecified chronic kidney disease: Secondary | ICD-10-CM | POA: Diagnosis not present

## 2023-01-16 DIAGNOSIS — I739 Peripheral vascular disease, unspecified: Secondary | ICD-10-CM | POA: Diagnosis not present

## 2023-01-16 DIAGNOSIS — I4819 Other persistent atrial fibrillation: Secondary | ICD-10-CM | POA: Diagnosis not present

## 2023-01-16 DIAGNOSIS — J449 Chronic obstructive pulmonary disease, unspecified: Secondary | ICD-10-CM | POA: Diagnosis not present

## 2023-01-16 DIAGNOSIS — I5032 Chronic diastolic (congestive) heart failure: Secondary | ICD-10-CM | POA: Diagnosis not present

## 2023-01-16 DIAGNOSIS — N1831 Chronic kidney disease, stage 3a: Secondary | ICD-10-CM | POA: Diagnosis not present

## 2023-01-16 NOTE — Telephone Encounter (Signed)
Forms signed and faxed back.

## 2023-01-24 DIAGNOSIS — J449 Chronic obstructive pulmonary disease, unspecified: Secondary | ICD-10-CM | POA: Diagnosis not present

## 2023-01-24 DIAGNOSIS — I739 Peripheral vascular disease, unspecified: Secondary | ICD-10-CM | POA: Diagnosis not present

## 2023-01-24 DIAGNOSIS — I4819 Other persistent atrial fibrillation: Secondary | ICD-10-CM | POA: Diagnosis not present

## 2023-01-24 DIAGNOSIS — I130 Hypertensive heart and chronic kidney disease with heart failure and stage 1 through stage 4 chronic kidney disease, or unspecified chronic kidney disease: Secondary | ICD-10-CM | POA: Diagnosis not present

## 2023-01-24 DIAGNOSIS — I5032 Chronic diastolic (congestive) heart failure: Secondary | ICD-10-CM | POA: Diagnosis not present

## 2023-01-24 DIAGNOSIS — N1831 Chronic kidney disease, stage 3a: Secondary | ICD-10-CM | POA: Diagnosis not present

## 2023-01-28 ENCOUNTER — Other Ambulatory Visit: Payer: Self-pay | Admitting: Cardiovascular Disease

## 2023-01-28 DIAGNOSIS — I4819 Other persistent atrial fibrillation: Secondary | ICD-10-CM

## 2023-01-30 NOTE — Telephone Encounter (Signed)
Eliquis refill please °

## 2023-01-30 NOTE — Telephone Encounter (Signed)
Prescription refill request for Eliquis received. Indication:AFIB Last office visit:7/24 Scr:1.22  6/24 Age: 89 Weight:75.4  kg  Prescription refilled

## 2023-01-31 DIAGNOSIS — I739 Peripheral vascular disease, unspecified: Secondary | ICD-10-CM | POA: Diagnosis not present

## 2023-01-31 DIAGNOSIS — I4819 Other persistent atrial fibrillation: Secondary | ICD-10-CM | POA: Diagnosis not present

## 2023-01-31 DIAGNOSIS — I130 Hypertensive heart and chronic kidney disease with heart failure and stage 1 through stage 4 chronic kidney disease, or unspecified chronic kidney disease: Secondary | ICD-10-CM | POA: Diagnosis not present

## 2023-01-31 DIAGNOSIS — J449 Chronic obstructive pulmonary disease, unspecified: Secondary | ICD-10-CM | POA: Diagnosis not present

## 2023-01-31 DIAGNOSIS — I5032 Chronic diastolic (congestive) heart failure: Secondary | ICD-10-CM | POA: Diagnosis not present

## 2023-01-31 DIAGNOSIS — N1831 Chronic kidney disease, stage 3a: Secondary | ICD-10-CM | POA: Diagnosis not present

## 2023-02-09 ENCOUNTER — Ambulatory Visit: Payer: Medicare Other

## 2023-02-09 VITALS — BP 118/68 | HR 92 | Wt 167.2 lb

## 2023-02-09 DIAGNOSIS — Z0000 Encounter for general adult medical examination without abnormal findings: Secondary | ICD-10-CM | POA: Diagnosis not present

## 2023-02-09 DIAGNOSIS — Z23 Encounter for immunization: Secondary | ICD-10-CM | POA: Diagnosis not present

## 2023-02-09 NOTE — Patient Instructions (Signed)
Ms. [name] [name] , Thank you for taking time to come for your Medicare Wellness Visit. I appreciate your ongoing commitment to your health goals. Please review the following plan we discussed and let me know if I can assist you in the future.   Referrals/Orders/Follow-Ups/Clinician Recommendations: maintain health and activity   Aim for 30 minutes of exercise or brisk walking, 6-8 glasses of water, and 5 servings of fruits and vegetables each day.   This is a list of the screening recommended for you and due dates:  Health Maintenance  Topic Date Due   COVID-19 Vaccine (6 - 2023-24 season) 12/25/2022   Medicare Annual Wellness Visit  02/04/2023   Flu Shot  07/24/2023*   DTaP/Tdap/Td vaccine (3 - Td or Tdap) 01/13/2029   Pneumonia Vaccine  Completed   DEXA scan (bone density measurement)  Completed   Zoster (Shingles) Vaccine  Completed   HPV Vaccine  Aged Out  *Topic was postponed. The date shown is not the original due date.    Advanced directives: (In Chart) A copy of your advanced directives are scanned into your chart should your provider ever need it.  Next Medicare Annual Wellness Visit scheduled for next year: Yes

## 2023-02-09 NOTE — Progress Notes (Addendum)
Subjective:   [name] [name] is a 89 y.o. female who presents for Medicare Annual (Subsequent) preventive examination. Along with [name] [name] son   Visit Complete: Virtual I connected with  [name] [name] on 02/09/23 by a audio enabled telemedicine application and verified that I am speaking with the correct person using two identifiers.  Patient Location: Home  Provider Location: Office/Clinic  I discussed the limitations of evaluation and management by telemedicine. The patient expressed understanding and agreed to proceed.  Vital Signs: Because this visit was a virtual/telehealth visit, some criteria may be missing or patient reported. Any vitals not documented were not able to be obtained and vitals that have been documented are patient reported.   Cardiac Risk Factors include: advanced age (>21men, >51 women);dyslipidemia;hypertension;obesity (BMI >30kg/m2)     Objective:    Today's Vitals   02/09/23 1136  BP: 118/68  Pulse: 92  SpO2: 96%  Weight: 167 lb 3.2 oz (75.8 kg)   Body mass index is 33.77 kg/m.     02/09/2023   11:49 AM 09/22/2022    9:36 AM 05/04/2022   11:36 AM 02/03/2022    1:05 PM 11/02/2021   10:17 AM 05/18/2021    1:50 PM 03/03/2021    9:49 AM  Advanced Directives  Does Patient Have a Medical Advance Directive? Yes Yes No Yes Yes Yes No  Type of Estate agent of [place];Living will   Healthcare Power of [place];Living will Healthcare Power of [place];Living will Healthcare Power of [place];Living will;Out of facility DNR (pink MOST or yellow form)   Does patient want to make changes to medical advance directive?    No - Patient declined     Copy of Healthcare Power of Attorney in Chart? No - copy requested   Yes - validated most recent copy scanned in chart (See row information)     Would patient like information on creating a medical advance directive?   No - Patient declined        Current Medications  (verified) Outpatient Encounter Medications as of 02/09/2023  Medication Sig   acetaminophen (TYLENOL) 500 MG tablet Take 2 tablets (1,000 mg total) by mouth 2 (two) times daily.   apixaban (ELIQUIS) 5 MG TABS tablet Take 1 tablet by mouth twice daily   atorvastatin (LIPITOR) 40 MG tablet Take 1 tablet by mouth once daily   diclofenac Sodium (VOLTAREN) 1 % GEL Apply 2 g topically 4 (four) times daily.   famotidine (PEPCID) 20 MG tablet Take 20mg  in the evening for 1 week. Then take as needed for indigestion.   furosemide (LASIX) 40 MG tablet Take 1 tablet by mouth once daily   ipratropium (ATROVENT) 0.06 % nasal spray USE 2 SPRAY(S) IN EACH NOSTRIL TWICE DAILY   lidocaine (LIDODERM) 5 % Place 1 patch onto the skin daily. Remove & Discard patch within 12 hours or as directed by MD   melatonin 3 MG TABS tablet Take 2 tablets (6 mg total) by mouth at bedtime as needed (sleep).   metoprolol succinate (TOPROL-XL) 100 MG 24 hr tablet TAKE 1 TABLET BY MOUTH ONCE DAILY. TAKE WITH OR IMMEDIATELY FOLLOWING A MEAL   Multiple Vitamin (MULTIVITAMIN WITH MINERALS) TABS tablet Take 1 tablet by mouth daily.   pantoprazole (PROTONIX) 40 MG tablet TAKE 1 TABLET BY MOUTH TWICE DAILY AS DIRECTED   RESTASIS 0.05 % ophthalmic emulsion Place 1 drop into both eyes 2 (two) times daily as needed (dry eyes).    lisinopril (ZESTRIL)  5 MG tablet Take 1 tablet (5 mg total) by mouth daily.   meclizine (ANTIVERT) 25 MG tablet TAKE 1/2 (ONE-HALF) TABLET BY MOUTH THREE TIMES DAILY AS NEEDED FOR VERTIGO (Patient not taking: Reported on 02/09/2023)   senna-docusate (SENOKOT-S) 8.6-50 MG tablet Take 1 tablet by mouth at bedtime as needed for mild constipation. (Patient not taking: Reported on 02/09/2023)   No facility-administered encounter medications on file as of 02/09/2023.    Allergies (verified) Sulfa antibiotics, Tiotropium bromide, Latex, Ultram [tramadol], and 5-alpha reductase inhibitors   History: Past Medical  History:  Diagnosis Date   Acute on chronic diastolic heart failure (HCC) 12/30/2020   Allergic rhinoconjunctivitis    Allergy    Anxiety    pt. managed- uses deep breathing    Arthritis    low back , stenosis   COLONIC POLYPS, RECURRENT 08/29/2006   2008 last colonoscopy. No further colonoscopy.      COPD (chronic obstructive pulmonary disease) (HCC)    Diverticulosis    Dysrhythmia    afib   Eczema    Environmental allergies    allergy shot- q friday in Dr. Roxy Cedar office. PFT's abnormal- recommended Spiriva to use preop & will d/c after surgery   GERD (gastroesophageal reflux disease)    Hiatal hernia    Hyperlipidemia    Hypertension    Lung nodule 2011   Paroxysmal atrial fibrillation (HCC) 11/27/2019   Peripheral vascular disease (HCC)    Shingles    Stenosis of popliteal artery (HCC)    blood clots in legs long ago     Trigeminal neuralgia    Left buttocks   Past Surgical History:  Procedure Laterality Date   ABDOMINAL AORTAGRAM N/A 06/17/2011   Procedure: ABDOMINAL Ronny Flurry;  Surgeon: Sherren Kerns, MD;  Location[facility] CATH LAB;  Service: Cardiovascular;  Laterality: N/A;   ABDOMINAL AORTOGRAM W/LOWER EXTREMITY Bilateral 06/22/2018   Procedure: ABDOMINAL AORTOGRAM W/LOWER EXTREMITY;  Surgeon: Sherren Kerns, MD;  Location: [facility] INVASIVE CV LAB;  Service: Cardiovascular;  Laterality: Bilateral;   ABDOMINAL AORTOGRAM W/LOWER EXTREMITY Bilateral 11/29/2019   Procedure: ABDOMINAL AORTOGRAM W/LOWER EXTREMITY;  Surgeon: Sherren Kerns, MD;  Location: [facility] INVASIVE CV LAB;  Service: Cardiovascular;  Laterality: Bilateral;   CATARACT EXTRACTION, BILATERAL     w IOL   CHOLECYSTECTOMY  1998   COLONOSCOPY     ENDARTERECTOMY FEMORAL Left 12/09/2019   Procedure: ENDARTERECTOMY FEMORAL with bovine patch angioplasty.;  Surgeon: Sherren Kerns, MD;  Location[facility] OR;  Service: Vascular;  Laterality: Left;   FEMORAL-POPLITEAL BYPASS GRAFT        x2 surgeries 1990's & 2009    FEMORAL-POPLITEAL BYPASS GRAFT Right 12/09/2019   Procedure: REDO RIGHT FEMORAL-POPLITEAL ARTERY BYPASS GRAFT;  Surgeon: Sherren Kerns, MD;  Location: [facility];  Service: Vascular;  Laterality: Right;   INJECTION KNEE Right Aug. 2016   Gel injection for pain   LUMBAR FUSION  07/06/2011   TONSILLECTOMY     as a teenager    TUBAL LIGATION     Family History  Problem Relation Age of Onset   Arthritis Mother    Hypertension Mother    Heart disease Father    Colon polyps Father    Breast cancer Sister    Breast cancer Daughter    Hypertension Son    Lung cancer Brother    Colon cancer Sister    Brain cancer Sister    Lung cancer Sister    Anesthesia problems Neg Hx  Hypotension Neg Hx    Malignant hyperthermia Neg Hx    Pseudochol deficiency Neg Hx    Social History   Socioeconomic History   Marital status: Widowed    Spouse name: Not on file   Number of children: 4   Years of education: 26   Highest education level: Not on file  Occupational History   Occupation: retired    Associate Professor: RETIRED  Tobacco Use   Smoking status: Former    Current packs/day: 0.00    Average packs/day: 2.0 packs/day for 30.0 years (60.0 ttl pk-yrs)    Types: Cigarettes    Start date: 06/19/1963    Quit date: 06/18/1993    Years since quitting: 29.6   Smokeless tobacco: Never   Tobacco comments:    QUIT IN 1995  Vaping Use   Vaping status: Never Used  Substance and Sexual Activity   Alcohol use: No    Alcohol/week: 0.0 standard drinks of alcohol   Drug use: No   Sexual activity: Not on file  Other Topics Concern   Not on file  Social History Narrative   Patient is widowed with 4 children. 2 grandkids. Lives alone.    Patient is right handed.   Patient has high school education.   Patient drinks 1 cup daily.      Retired from [employer] for 28 years, works 2 days a week until 2016.       Hobbies: volunteers at [employer], [place] from church visits people.       No  caffeine.    Social Determinants of Health   Financial Resource Strain: Low Risk  (02/09/2023)   Overall Financial Resource Strain (CARDIA)    Difficulty of Paying Living Expenses: Not hard at all  Food Insecurity: No Food Insecurity (02/09/2023)   Hunger Vital Sign    Worried About Running Out of Food in the Last Year: Never true    Ran Out of Food in the Last Year: Never true  Transportation Needs: No Transportation Needs (02/09/2023)   PRAPARE - Administrator, Civil Service (Medical): No    Lack of Transportation (Non-Medical): No  Physical Activity: Sufficiently Active (02/09/2023)   Exercise Vital Sign    Days of Exercise per Week: 7 days    Minutes of Exercise per Session: 30 min  Stress: No Stress Concern Present (02/09/2023)   [employer] of Occupational Health - Occupational Stress Questionnaire    Feeling of Stress : Not at all  Social Connections: Moderately Integrated (02/09/2023)   Social Connection and Isolation Panel [NHANES]    Frequency of Communication with Friends and Family: More than three times a week    Frequency of Social Gatherings with Friends and Family: More than three times a week    Attends Religious Services: More than 4 times per year    Active Member of [employer] or Organizations: Yes    Attends Banker Meetings: 1 to 4 times per year    Marital Status: Widowed    Tobacco Counseling Counseling given: Not Answered Tobacco comments: QUIT IN 1995   Clinical Intake:  Pre-visit preparation completed: Yes  Pain : No/denies pain     BMI - recorded: 33.77 Nutritional Status: BMI > 30  Obese Nutritional Risks: None Diabetes: No  How often do you need to have someone help you when you read instructions, pamphlets, or other written materials from your doctor or pharmacy?: 1 - Never  Interpreter Needed?: No  Information entered by :: Lanier Ensign, LPN   Activities of Daily Living    02/09/2023   11:43 AM  05/04/2022    8:34 PM  In your present state of health, do you have any difficulty performing the following activities:  Hearing? 0 0  Vision? 0 0  Difficulty concentrating or making decisions? 0 0  Walking or climbing stairs? 0 0  Dressing or bathing? 0 0  Doing errands, shopping? 0 0  Preparing Food and eating ? N   Using the Toilet? N   In the past six months, have you accidently leaked urine? N   Do you have problems with loss of bowel control? N   Managing your Medications? N   Managing your Finances? N   Housekeeping or managing your Housekeeping? N     Patient Care Team: Shelva Majestic, MD as PCP - General (Family Medicine) Chilton Si, MD as PCP - Cardiology (Cardiology) Tarry Kos, MD as Attending Physician (Orthopedic Surgery) Marcello Fennel, MD as Consulting Physician (Physical Medicine and Rehabilitation) Sallye Lat, MD as Consulting Physician (Ophthalmology) Drema [place], DO as Consulting Physician (Neurology) Erroll Luna, [facility] (Inactive) as Pharmacist (Pharmacist)  Indicate any recent Medical Services you may have received from other than [facility] providers in the past year (date may be approximate).     Assessment:   This is a routine wellness examination for [name] [name].  Hearing/Vision screen Hearing Screening - Comments:: Pt denies any hearing issues  Vision Screening - Comments:: Pt follows up with Dr Dione Booze for annual eye exams    Goals Addressed             This Visit's Progress    Patient Stated       Maintain health and activity        Depression Screen    02/09/2023   11:46 AM 06/30/2022   11:06 AM 04/26/2022   10:59 AM 02/22/2022   11:13 AM 02/03/2022    1:03 PM 12/28/2021   11:03 AM 11/02/2021   10:17 AM  PHQ 2/9 Scores  PHQ - 2 Score 0 0 0 0 0 0 0    Fall Risk    02/09/2023   11:49 AM 06/30/2022   11:06 AM 04/26/2022   10:59 AM 02/22/2022   11:13 AM 02/03/2022    1:08 PM  Fall Risk   Falls in the past year? 0  0 0 0 0  Number falls in past yr: 0 0 0  0  Injury with Fall? 0 0 0  0  Risk for fall due to : Impaired vision    Impaired balance/gait;Impaired vision  Follow up Falls prevention discussed    Falls prevention discussed    MEDICARE RISK AT HOME: Medicare Risk at Home Any stairs in or around the home?: No If so, are there any without handrails?: No Home free of loose throw rugs in walkways, pet beds, electrical cords, etc?: Yes Adequate lighting in your home to reduce risk of falls?: Yes Life alert?: Yes Use of a cane, walker or w/c?: Yes Grab bars in the bathroom?: Yes Shower chair or bench in shower?: Yes Elevated toilet seat or a handicapped toilet?: Yes  TIMED UP AND GO:  Was the test performed?  No    Cognitive Function:        02/09/2023   11:50 AM 02/03/2022    1:10 PM 01/21/2021   11:10 AM 01/16/2020   10:32 AM  6CIT Screen  What  Year? 0 points 0 points 0 points 0 points  What month? 0 points 0 points 0 points 0 points  What time? 0 points 0 points 0 points   Count back from 20 0 points 0 points 0 points 0 points  Months in reverse 0 points 0 points 0 points 0 points  Repeat phrase 0 points 0 points 2 points 4 points  Total Score 0 points 0 points 2 points     Immunizations Immunization History  Administered Date(s) Administered   Fluad Quad(high Dose 65+) 01/14/2019, 02/12/2020, 01/21/2021   Influenza Split 12/25/2011   Influenza Whole 01/23/2010   Influenza,inj,Quad PF,6+ Mos 12/31/2012, 12/26/2014, 01/08/2016   Influenza-Unspecified 01/23/2014, 12/25/2017, 12/28/2021   PFIZER Comirnaty(Gray Top)Covid-19 Tri-Sucrose Vaccine 02/18/2022   PFIZER(Purple Top)SARS-COV-2 Vaccination 05/14/2019, 06/03/2019, 01/22/2020, 08/05/2020   PPD Test 06/24/2011   Pneumococcal Conjugate-13 08/31/2015   Pneumococcal Polysaccharide-23 04/25/2005   Td 04/25/2005   Tdap 01/14/2019   Zoster Recombinant(Shingrix) 04/20/2017, 06/29/2017   Zoster, Live 03/02/2010    TDAP  status: Up to date  Flu Vaccine status: Due, Education has been provided regarding the importance of this vaccine. Advised may receive this vaccine at local pharmacy or Health Dept. Aware to provide a copy of the vaccination record if obtained from local pharmacy or Health Dept. Verbalized acceptance and understanding.  Pneumococcal vaccine status: Up to date  Covid-19 vaccine status: Information provided on how to obtain vaccines.   Qualifies for Shingles Vaccine? Yes   Zostavax completed Yes   Shingrix Completed?: Yes  Screening Tests Health Maintenance  Topic Date Due   COVID-19 Vaccine (6 - 2023-24 season) 12/25/2022   INFLUENZA VACCINE  07/24/2023 (Originally 11/24/2022)   Medicare Annual Wellness (AWV)  02/09/2024   DTaP/Tdap/Td (3 - Td or Tdap) 01/13/2029   Pneumonia Vaccine 83+ Years old  Completed   DEXA SCAN  Completed   Zoster Vaccines- Shingrix  Completed   HPV VACCINES  Aged Out    Health Maintenance  Health Maintenance Due  Topic Date Due   COVID-19 Vaccine (6 - 2023-24 season) 12/25/2022    Colorectal cancer screening: No longer required.   Mammogram status: No longer required due to age .  Bone Density status: Completed 04/26/08. Results reflect: Bone density results: OSTEOPENIA. Repeat every 2 years.  Additional Screening:  Vision Screening: Recommended annual ophthalmology exams for early detection of glaucoma and other disorders of the eye. Is the patient up to date with their annual eye exam?  Yes  Who is the provider or what is the name of the office in which the patient attends annual eye exams? Dr Dione Booze  If pt is not established with a provider, would they like to be referred to a provider to establish care? No .   Dental Screening: Recommended annual dental exams for proper oral hygiene    Community Resource Referral / Chronic Care Management: CRR required this visit?  No   CCM required this visit?  No     Plan:     I have personally  reviewed and noted the following in the patient's chart:   Medical and social history Use of alcohol, tobacco or illicit drugs  Current medications and supplements including opioid prescriptions. Patient is not currently taking opioid prescriptions. Functional ability and status Nutritional status Physical activity Advanced directives List of other physicians Hospitalizations, surgeries, and ER visits in previous 12 months Vitals Screenings to include cognitive, depression, and falls Referrals and appointments  In addition, I have reviewed and discussed with  patient certain preventive protocols, quality metrics, and best practice recommendations. A written personalized care plan for preventive services as well as general preventive health recommendations were provided to patient.     Marzella Schlein, LPN   16/01/[id number]   After Visit Summary: (MyChart) Due to this being a telephonic visit, the after visit summary with patients personalized plan was offered to patient via MyChart   Nurse Notes: none

## 2023-02-11 ENCOUNTER — Other Ambulatory Visit: Payer: Self-pay | Admitting: Family

## 2023-02-13 NOTE — Progress Notes (Signed)
Subjective:   [name] [name] is a 89 y.o. female who presents for Medicare Annual (Subsequent) preventive examination. Along with Jettie Pagan son    Patient Location: Home  Provider Location: Office/Clinic  I discussed the limitations of evaluation and management by telemedicine. The patient expressed understanding and agreed to proceed.    Cardiac Risk Factors include: advanced age (>62men, >31 women);dyslipidemia;hypertension;obesity (BMI >30kg/m2)     Objective:    Today's Vitals   02/09/23 1136  BP: 118/68  Pulse: 92  SpO2: 96%  Weight: 167 lb 3.2 oz (75.8 kg)   Body mass index is 33.77 kg/m.     02/09/2023   11:49 AM 09/22/2022    9:36 AM 05/04/2022   11:36 AM 02/03/2022    1:05 PM 11/02/2021   10:17 AM 05/18/2021    1:50 PM 03/03/2021    9:49 AM  Advanced Directives  Does Patient Have a Medical Advance Directive? Yes Yes No Yes Yes Yes No  Type of Estate agent of [place];Living will   Healthcare Power of [place];Living will Healthcare Power of [place];Living will Healthcare Power of [place];Living will;Out of facility DNR (pink MOST or yellow form)   Does patient want to make changes to medical advance directive?    No - Patient declined     Copy of Healthcare Power of Attorney in Chart? No - copy requested   Yes - validated most recent copy scanned in chart (See row information)     Would patient like information on creating a medical advance directive?   No - Patient declined        Current Medications (verified) Outpatient Encounter Medications as of 02/09/2023  Medication Sig   acetaminophen (TYLENOL) 500 MG tablet Take 2 tablets (1,000 mg total) by mouth 2 (two) times daily.   apixaban (ELIQUIS) 5 MG TABS tablet Take 1 tablet by mouth twice daily   atorvastatin (LIPITOR) 40 MG tablet Take 1 tablet by mouth once daily   diclofenac Sodium (VOLTAREN) 1 % GEL Apply 2 g topically 4 (four) times daily.   famotidine (PEPCID) 20 MG  tablet Take 20mg  in the evening for 1 week. Then take as needed for indigestion.   furosemide (LASIX) 40 MG tablet Take 1 tablet by mouth once daily   ipratropium (ATROVENT) 0.06 % nasal spray USE 2 SPRAY(S) IN EACH NOSTRIL TWICE DAILY   lidocaine (LIDODERM) 5 % Place 1 patch onto the skin daily. Remove & Discard patch within 12 hours or as directed by MD   melatonin 3 MG TABS tablet Take 2 tablets (6 mg total) by mouth at bedtime as needed (sleep).   metoprolol succinate (TOPROL-XL) 100 MG 24 hr tablet TAKE 1 TABLET BY MOUTH ONCE DAILY. TAKE WITH OR IMMEDIATELY FOLLOWING A MEAL   Multiple Vitamin (MULTIVITAMIN WITH MINERALS) TABS tablet Take 1 tablet by mouth daily.   pantoprazole (PROTONIX) 40 MG tablet TAKE 1 TABLET BY MOUTH TWICE DAILY AS DIRECTED   RESTASIS 0.05 % ophthalmic emulsion Place 1 drop into both eyes 2 (two) times daily as needed (dry eyes).    lisinopril (ZESTRIL) 5 MG tablet Take 1 tablet (5 mg total) by mouth daily.   meclizine (ANTIVERT) 25 MG tablet TAKE 1/2 (ONE-HALF) TABLET BY MOUTH THREE TIMES DAILY AS NEEDED FOR VERTIGO (Patient not taking: Reported on 02/09/2023)   senna-docusate (SENOKOT-S) 8.6-50 MG tablet Take 1 tablet by mouth at bedtime as needed for mild constipation. (Patient not taking: Reported on 02/09/2023)   No facility-administered  encounter medications on file as of 02/09/2023.    Allergies (verified) Sulfa antibiotics, Tiotropium bromide, Latex, Ultram [tramadol], and 5-alpha reductase inhibitors   History: Past Medical History:  Diagnosis Date   Acute on chronic diastolic heart failure (HCC) 12/30/2020   Allergic rhinoconjunctivitis    Allergy    Anxiety    pt. managed- uses deep breathing    Arthritis    low back , stenosis   COLONIC POLYPS, RECURRENT 08/29/2006   2008 last colonoscopy. No further colonoscopy.      COPD (chronic obstructive pulmonary disease) (HCC)    Diverticulosis    Dysrhythmia    afib   Eczema    Environmental allergies     allergy shot- q friday in Dr. Roxy Cedar office. PFT's abnormal- recommended Spiriva to use preop & will d/c after surgery   GERD (gastroesophageal reflux disease)    Hiatal hernia    Hyperlipidemia    Hypertension    Lung nodule 2011   Paroxysmal atrial fibrillation (HCC) 11/27/2019   Peripheral vascular disease (HCC)    Shingles    Stenosis of popliteal artery (HCC)    blood clots in legs long ago     Trigeminal neuralgia    Left buttocks   Past Surgical History:  Procedure Laterality Date   ABDOMINAL AORTAGRAM N/A 06/17/2011   Procedure: ABDOMINAL Ronny Flurry;  Surgeon: Sherren Kerns, MD;  Location: [facility] CATH LAB;  Service: Cardiovascular;  Laterality: N/A;   ABDOMINAL AORTOGRAM W/LOWER EXTREMITY Bilateral 06/22/2018   Procedure: ABDOMINAL AORTOGRAM W/LOWER EXTREMITY;  Surgeon: Sherren Kerns, MD;  Location: [facility] INVASIVE CV LAB;  Service: Cardiovascular;  Laterality: Bilateral;   ABDOMINAL AORTOGRAM W/LOWER EXTREMITY Bilateral 11/29/2019   Procedure: ABDOMINAL AORTOGRAM W/LOWER EXTREMITY;  Surgeon: Sherren Kerns, MD;  Location: [facility] INVASIVE CV LAB;  Service: Cardiovascular;  Laterality: Bilateral;   CATARACT EXTRACTION, BILATERAL     w IOL   CHOLECYSTECTOMY  1998   COLONOSCOPY     ENDARTERECTOMY FEMORAL Left 12/09/2019   Procedure: ENDARTERECTOMY FEMORAL with bovine patch angioplasty.;  Surgeon: Sherren Kerns, MD;  Location: [facility] OR;  Service: Vascular;  Laterality: Left;   FEMORAL-POPLITEAL BYPASS GRAFT        x2 surgeries 1990's & 2009   FEMORAL-POPLITEAL BYPASS GRAFT Right 12/09/2019   Procedure: REDO RIGHT FEMORAL-POPLITEAL ARTERY BYPASS GRAFT;  Surgeon: Sherren Kerns, MD;  Location: [facility] OR;  Service: Vascular;  Laterality: Right;   INJECTION KNEE Right Aug. 2016   Gel injection for pain   LUMBAR FUSION  07/06/2011   TONSILLECTOMY     as a teenager    TUBAL LIGATION     Family History  Problem Relation Age of Onset   Arthritis Mother    Hypertension Mother    Heart  disease Father    Colon polyps Father    Breast cancer Sister    Breast cancer Daughter    Hypertension Son    Lung cancer Brother    Colon cancer Sister    Brain cancer Sister    Lung cancer Sister    Anesthesia problems Neg Hx    Hypotension Neg Hx    Malignant hyperthermia Neg Hx    Pseudochol deficiency Neg Hx    Social History   Socioeconomic History   Marital status: Widowed    Spouse name: Not on file   Number of children: 4   Years of education: 18   Highest education level: Not on file  Occupational History   Occupation:  retired    Associate Professor: RETIRED  Tobacco Use   Smoking status: Former    Current packs/day: 0.00    Average packs/day: 2.0 packs/day for 30.0 years (60.0 ttl pk-yrs)    Types: Cigarettes    Start date: 06/19/1963    Quit date: 06/18/1993    Years since quitting: 29.6   Smokeless tobacco: Never   Tobacco comments:    QUIT IN 1995  Vaping Use   Vaping status: Never Used  Substance and Sexual Activity   Alcohol use: No    Alcohol/week: 0.0 standard drinks of alcohol   Drug use: No   Sexual activity: Not on file  Other Topics Concern   Not on file  Social History Narrative   Patient is widowed with 4 children. 2 grandkids. Lives alone.    Patient is right handed.   Patient has high school education.   Patient drinks 1 cup daily.      Retired from [employer] for 28 years, works 2 days a week until 2016.       Hobbies: volunteers at [employer], [place] from church visits people.       No caffeine.    Social Determinants of Health   Financial Resource Strain: Low Risk  (02/09/2023)   Overall Financial Resource Strain (CARDIA)    Difficulty of Paying Living Expenses: Not hard at all  Food Insecurity: No Food Insecurity (02/09/2023)   Hunger Vital Sign    Worried About Running Out of Food in the Last Year: Never true    Ran Out of Food in the Last Year: Never true  Transportation Needs: No Transportation Needs (02/09/2023)    PRAPARE - Administrator, Civil Service (Medical): No    Lack of Transportation (Non-Medical): No  Physical Activity: Sufficiently Active (02/09/2023)   Exercise Vital Sign    Days of Exercise per Week: 7 days    Minutes of Exercise per Session: 30 min  Stress: No Stress Concern Present (02/09/2023)   [employer] of Occupational Health - Occupational Stress Questionnaire    Feeling of Stress : Not at all  Social Connections: Moderately Integrated (02/09/2023)   Social Connection and Isolation Panel [NHANES]    Frequency of Communication with Friends and Family: More than three times a week    Frequency of Social Gatherings with Friends and Family: More than three times a week    Attends Religious Services: More than 4 times per year    Active Member of [employer] or Organizations: Yes    Attends Banker Meetings: 1 to 4 times per year    Marital Status: Widowed    Tobacco Counseling Counseling given: Not Answered Tobacco comments: QUIT IN 1995   Clinical Intake:  Pre-visit preparation completed: Yes  Pain : No/denies pain     BMI - recorded: 33.77 Nutritional Status: BMI > 30  Obese Nutritional Risks: None Diabetes: No  How often do you need to have someone help you when you read instructions, pamphlets, or other written materials from your doctor or pharmacy?: 1 - Never  Interpreter Needed?: No  Information entered by :: Lanier Ensign, LPN   Activities of Daily Living    02/09/2023   11:43 AM 05/04/2022    8:34 PM  In your present state of health, do you have any difficulty performing the following activities:  Hearing? 0 0  Vision? 0 0  Difficulty concentrating or making decisions? 0 0  Walking or climbing stairs? 0  0  Dressing or bathing? 0 0  Doing errands, shopping? 0 0  Preparing Food and eating ? N   Using the Toilet? N   In the past six months, have you accidently leaked urine? N   Do you have problems with loss of bowel  control? N   Managing your Medications? N   Managing your Finances? N   Housekeeping or managing your Housekeeping? N     Patient Care Team: Shelva Majestic, MD as PCP - General (Family Medicine) Chilton Si, MD as PCP - Cardiology (Cardiology) Tarry Kos, MD as Attending Physician (Orthopedic Surgery) Marcello Fennel, MD as Consulting Physician (Physical Medicine and Rehabilitation) Sallye Lat, MD as Consulting Physician (Ophthalmology) Drema Dallas, DO as Consulting Physician (Neurology) Erroll Luna, [facility] (Inactive) as Pharmacist (Pharmacist)  Indicate any recent Medical Services you may have received from other than [facility] providers in the past year (date may be approximate).     Assessment:   This is a routine wellness examination for Rhyen.  Hearing/Vision screen Hearing Screening - Comments:: Pt denies any hearing issues  Vision Screening - Comments:: Pt follows up with Dr Dione Booze for annual eye exams    Goals Addressed             This Visit's Progress    Patient Stated       Maintain health and activity        Depression Screen    02/09/2023   11:46 AM 06/30/2022   11:06 AM 04/26/2022   10:59 AM 02/22/2022   11:13 AM 02/03/2022    1:03 PM 12/28/2021   11:03 AM 11/02/2021   10:17 AM  PHQ 2/9 Scores  PHQ - 2 Score 0 0 0 0 0 0 0    Fall Risk    02/09/2023   11:49 AM 06/30/2022   11:06 AM 04/26/2022   10:59 AM 02/22/2022   11:13 AM 02/03/2022    1:08 PM  Fall Risk   Falls in the past year? 0 0 0 0 0  Number falls in past yr: 0 0 0  0  Injury with Fall? 0 0 0  0  Risk for fall due to : Impaired vision    Impaired balance/gait;Impaired vision  Follow up Falls prevention discussed    Falls prevention discussed    MEDICARE RISK AT HOME: Medicare Risk at Home Any stairs in or around the home?: No If so, are there any without handrails?: No Home free of loose throw rugs in walkways, pet beds, electrical cords, etc?: Yes Adequate  lighting in your home to reduce risk of falls?: Yes Life alert?: Yes Use of a cane, walker or w/c?: Yes Grab bars in the bathroom?: Yes Shower chair or bench in shower?: Yes Elevated toilet seat or a handicapped toilet?: Yes  TIMED UP AND GO:  Was the test performed?  No    Cognitive Function:        02/09/2023   11:50 AM 02/03/2022    1:10 PM 01/21/2021   11:10 AM 01/16/2020   10:32 AM  6CIT Screen  What Year? 0 points 0 points 0 points 0 points  What month? 0 points 0 points 0 points 0 points  What time? 0 points 0 points 0 points   Count back from 20 0 points 0 points 0 points 0 points  Months in reverse 0 points 0 points 0 points 0 points  Repeat phrase 0 points 0 points 2 points 4  points  Total Score 0 points 0 points 2 points     Immunizations Immunization History  Administered Date(s) Administered   Fluad Quad(high Dose 65+) 01/14/2019, 02/12/2020, 01/21/2021   Influenza Split 12/25/2011   Influenza Whole 01/23/2010   Influenza,inj,Quad PF,6+ Mos 12/31/2012, 12/26/2014, 01/08/2016   Influenza-Unspecified 01/23/2014, 12/25/2017, 12/28/2021   PFIZER Comirnaty(Gray Top)Covid-19 Tri-Sucrose Vaccine 02/18/2022   PFIZER(Purple Top)SARS-COV-2 Vaccination 05/14/2019, 06/03/2019, 01/22/2020, 08/05/2020   PPD Test 06/24/2011   Pneumococcal Conjugate-13 08/31/2015   Pneumococcal Polysaccharide-23 04/25/2005   Td 04/25/2005   Tdap 01/14/2019   Zoster Recombinant(Shingrix) 04/20/2017, 06/29/2017   Zoster, Live 03/02/2010    TDAP status: Up to date  Flu Vaccine status: Due, Education has been provided regarding the importance of this vaccine. Advised may receive this vaccine at local pharmacy or Health Dept. Aware to provide a copy of the vaccination record if obtained from local pharmacy or Health Dept. Verbalized acceptance and understanding.  Pneumococcal vaccine status: Up to date  Covid-19 vaccine status: Information provided on how to obtain vaccines.   Qualifies  for Shingles Vaccine? Yes   Zostavax completed Yes   Shingrix Completed?: Yes  Screening Tests Health Maintenance  Topic Date Due   COVID-19 Vaccine (6 - 2023-24 season) 12/25/2022   INFLUENZA VACCINE  07/24/2023 (Originally 11/24/2022)   Medicare Annual Wellness (AWV)  02/09/2024   DTaP/Tdap/Td (3 - Td or Tdap) 01/13/2029   Pneumonia Vaccine 78+ Years old  Completed   DEXA SCAN  Completed   Zoster Vaccines- Shingrix  Completed   HPV VACCINES  Aged Out    Health Maintenance  Health Maintenance Due  Topic Date Due   COVID-19 Vaccine (6 - 2023-24 season) 12/25/2022    Colorectal cancer screening: No longer required.   Mammogram status: No longer required due to age .  Bone Density status: Completed 04/26/08. Results reflect: Bone density results: OSTEOPENIA. Repeat every 2 years.  Additional Screening:  Vision Screening: Recommended annual ophthalmology exams for early detection of glaucoma and other disorders of the eye. Is the patient up to date with their annual eye exam?  Yes  Who is the provider or what is the name of the office in which the patient attends annual eye exams? Dr Dione Booze  If pt is not established with a provider, would they like to be referred to a provider to establish care? No .   Dental Screening: Recommended annual dental exams for proper oral hygiene    Community Resource Referral / Chronic Care Management: CRR required this visit?  No   CCM required this visit?  No     Plan:     I have personally reviewed and noted the following in the patient's chart:   Medical and social history Use of alcohol, tobacco or illicit drugs  Current medications and supplements including opioid prescriptions. Patient is not currently taking opioid prescriptions. Functional ability and status Nutritional status Physical activity Advanced directives List of other physicians Hospitalizations, surgeries, and ER visits in previous 12 months Vitals Screenings to  include cognitive, depression, and falls Referrals and appointments  In addition, I have reviewed and discussed with patient certain preventive protocols, quality metrics, and best practice recommendations. A written personalized care plan for preventive services as well as general preventive health recommendations were provided to patient.     Marzella Schlein, LPN   [id number]   After Visit Summary Pt declined will follow up on my chart   Nurse Notes: none

## 2023-02-15 NOTE — Progress Notes (Signed)
Subjective:   [name] [name] is a 89 y.o. female who presents for Medicare Annual (Subsequent) preventive examination. Along with Jettie Pagan son    Patient office in person   Provider Location: Office/Clinic    Cardiac Risk Factors include: advanced age (>60men, >23 women);dyslipidemia;hypertension;obesity (BMI >30kg/m2)     Objective:    Today's Vitals   02/09/23 1136  BP: 118/68  Pulse: 92  SpO2: 96%  Weight: 167 lb 3.2 oz (75.8 kg)   Body mass index is 33.77 kg/m.     02/09/2023   11:49 AM 09/22/2022    9:36 AM 05/04/2022   11:36 AM 02/03/2022    1:05 PM 11/02/2021   10:17 AM 05/18/2021    1:50 PM 03/03/2021    9:49 AM  Advanced Directives  Does Patient Have a Medical Advance Directive? Yes Yes No Yes Yes Yes No  Type of Estate agent of [place];Living will   Healthcare Power of [place];Living will Healthcare Power of [place];Living will Healthcare Power of [place];Living will;Out of facility DNR (pink MOST or yellow form)   Does patient want to make changes to medical advance directive?    No - Patient declined     Copy of Healthcare Power of Attorney in Chart? No - copy requested   Yes - validated most recent copy scanned in chart (See row information)     Would patient like information on creating a medical advance directive?   No - Patient declined        Current Medications (verified) Outpatient Encounter Medications as of 02/09/2023  Medication Sig   acetaminophen (TYLENOL) 500 MG tablet Take 2 tablets (1,000 mg total) by mouth 2 (two) times daily.   apixaban (ELIQUIS) 5 MG TABS tablet Take 1 tablet by mouth twice daily   atorvastatin (LIPITOR) 40 MG tablet Take 1 tablet by mouth once daily   diclofenac Sodium (VOLTAREN) 1 % GEL Apply 2 g topically 4 (four) times daily.   famotidine (PEPCID) 20 MG tablet Take 20mg  in the evening for 1 week. Then take as needed for indigestion.   furosemide (LASIX) 40 MG tablet Take 1 tablet by  mouth once daily   ipratropium (ATROVENT) 0.06 % nasal spray USE 2 SPRAY(S) IN EACH NOSTRIL TWICE DAILY   lidocaine (LIDODERM) 5 % Place 1 patch onto the skin daily. Remove & Discard patch within 12 hours or as directed by MD   melatonin 3 MG TABS tablet Take 2 tablets (6 mg total) by mouth at bedtime as needed (sleep).   metoprolol succinate (TOPROL-XL) 100 MG 24 hr tablet TAKE 1 TABLET BY MOUTH ONCE DAILY. TAKE WITH OR IMMEDIATELY FOLLOWING A MEAL   Multiple Vitamin (MULTIVITAMIN WITH MINERALS) TABS tablet Take 1 tablet by mouth daily.   pantoprazole (PROTONIX) 40 MG tablet TAKE 1 TABLET BY MOUTH TWICE DAILY AS DIRECTED   RESTASIS 0.05 % ophthalmic emulsion Place 1 drop into both eyes 2 (two) times daily as needed (dry eyes).    meclizine (ANTIVERT) 25 MG tablet TAKE 1/2 (ONE-HALF) TABLET BY MOUTH THREE TIMES DAILY AS NEEDED FOR VERTIGO (Patient not taking: Reported on 02/09/2023)   senna-docusate (SENOKOT-S) 8.6-50 MG tablet Take 1 tablet by mouth at bedtime as needed for mild constipation. (Patient not taking: Reported on 02/09/2023)   [DISCONTINUED] lisinopril (ZESTRIL) 5 MG tablet Take 1 tablet (5 mg total) by mouth daily.   No facility-administered encounter medications on file as of 02/09/2023.    Allergies (verified) Sulfa antibiotics, Tiotropium bromide,  Latex, Ultram [tramadol], and 5-alpha reductase inhibitors   History: Past Medical History:  Diagnosis Date   Acute on chronic diastolic heart failure (HCC) 12/30/2020   Allergic rhinoconjunctivitis    Allergy    Anxiety    pt. managed- uses deep breathing    Arthritis    low back , stenosis   COLONIC POLYPS, RECURRENT 08/29/2006   2008 last colonoscopy. No further colonoscopy.      COPD (chronic obstructive pulmonary disease) (HCC)    Diverticulosis    Dysrhythmia    afib   Eczema    Environmental allergies    allergy shot- q friday in Dr. Roxy Cedar office. PFT's abnormal- recommended Spiriva to use preop & will d/c after  surgery   GERD (gastroesophageal reflux disease)    Hiatal hernia    Hyperlipidemia    Hypertension    Lung nodule 2011   Paroxysmal atrial fibrillation (HCC) 11/27/2019   Peripheral vascular disease (HCC)    Shingles    Stenosis of popliteal artery (HCC)    blood clots in legs long ago     Trigeminal neuralgia    Left buttocks   Past Surgical History:  Procedure Laterality Date   ABDOMINAL AORTAGRAM N/A 06/17/2011   Procedure: ABDOMINAL Ronny Flurry;  Surgeon: Sherren Kerns, MD;  Location: [facility] CATH LAB;  Service: Cardiovascular;  Laterality: N/A;   ABDOMINAL AORTOGRAM W/LOWER EXTREMITY Bilateral 06/22/2018   Procedure: ABDOMINAL AORTOGRAM W/LOWER EXTREMITY;  Surgeon: Sherren Kerns, MD;  Location: [facility] INVASIVE CV LAB;  Service: Cardiovascular;  Laterality: Bilateral;   ABDOMINAL AORTOGRAM W/LOWER EXTREMITY Bilateral 11/29/2019   Procedure: ABDOMINAL AORTOGRAM W/LOWER EXTREMITY;  Surgeon: Sherren Kerns, MD;  Location: [facility] INVASIVE CV LAB;  Service: Cardiovascular;  Laterality: Bilateral;   CATARACT EXTRACTION, BILATERAL     w IOL   CHOLECYSTECTOMY  1998   COLONOSCOPY     ENDARTERECTOMY FEMORAL Left 12/09/2019   Procedure: ENDARTERECTOMY FEMORAL with bovine patch angioplasty.;  Surgeon: Sherren Kerns, MD;  Location: [facility] OR;  Service: Vascular;  Laterality: Left;   FEMORAL-POPLITEAL BYPASS GRAFT        x2 surgeries 1990's & 2009   FEMORAL-POPLITEAL BYPASS GRAFT Right 12/09/2019   Procedure: REDO RIGHT FEMORAL-POPLITEAL ARTERY BYPASS GRAFT;  Surgeon: Sherren Kerns, MD;  [facility] OR;  Service: Vascular;  Laterality: Right;   INJECTION KNEE Right Aug. 2016   Gel injection for pain   LUMBAR FUSION  07/06/2011   TONSILLECTOMY     as a teenager    TUBAL LIGATION     Family History  Problem Relation Age of Onset   Arthritis Mother    Hypertension Mother    Heart disease Father    Colon polyps Father    Breast cancer Sister    Breast cancer Daughter    Hypertension Son     Lung cancer Brother    Colon cancer Sister    Brain cancer Sister    Lung cancer Sister    Anesthesia problems Neg Hx    Hypotension Neg Hx    Malignant hyperthermia Neg Hx    Pseudochol deficiency Neg Hx    Social History   Socioeconomic History   Marital status: Widowed    Spouse name: Not on file   Number of children: 4   Years of education: 12   Highest education level: Not on file  Occupational History   Occupation: retired    Associate Professor: RETIRED  Tobacco Use   Smoking status: Former  Current packs/day: 0.00    Average packs/day: 2.0 packs/day for 30.0 years (60.0 ttl pk-yrs)    Types: Cigarettes    Start date: 06/19/1963    Quit date: 06/18/1993    Years since quitting: 29.6   Smokeless tobacco: Never   Tobacco comments:    QUIT IN 1995  Vaping Use   Vaping status: Never Used  Substance and Sexual Activity   Alcohol use: No    Alcohol/week: 0.0 standard drinks of alcohol   Drug use: No   Sexual activity: Not on file  Other Topics Concern   Not on file  Social History Narrative   Patient is widowed with 4 children. 2 grandkids. Lives alone.    Patient is right handed.   Patient has high school education.   Patient drinks 1 cup daily.      Retired from [employer] for 28 years, works 2 days a week until 2016.       Hobbies: volunteers at [employer], [place] from church visits people.       No caffeine.    Social Determinants of Health   Financial Resource Strain: Low Risk  (02/09/2023)   Overall Financial Resource Strain (CARDIA)    Difficulty of Paying Living Expenses: Not hard at all  Food Insecurity: No Food Insecurity (02/09/2023)   Hunger Vital Sign    Worried About Running Out of Food in the Last Year: Never true    Ran Out of Food in the Last Year: Never true  Transportation Needs: No Transportation Needs (02/09/2023)   PRAPARE - Administrator, Civil Service (Medical): No    Lack of Transportation (Non-Medical): No   Physical Activity: Sufficiently Active (02/09/2023)   Exercise Vital Sign    Days of Exercise per Week: 7 days    Minutes of Exercise per Session: 30 min  Stress: No Stress Concern Present (02/09/2023)   [employer] of Occupational Health - Occupational Stress Questionnaire    Feeling of Stress : Not at all  Social Connections: Moderately Integrated (02/09/2023)   Social Connection and Isolation Panel [NHANES]    Frequency of Communication with Friends and Family: More than three times a week    Frequency of Social Gatherings with Friends and Family: More than three times a week    Attends Religious Services: More than 4 times per year    Active Member of [employer] or Organizations: Yes    Attends Banker Meetings: 1 to 4 times per year    Marital Status: Widowed    Tobacco Counseling Counseling given: Not Answered Tobacco comments: QUIT IN 1995   Clinical Intake:  Pre-visit preparation completed: Yes  Pain : No/denies pain     BMI - recorded: 33.77 Nutritional Status: BMI > 30  Obese Nutritional Risks: None Diabetes: No  How often do you need to have someone help you when you read instructions, pamphlets, or other written materials from your doctor or pharmacy?: 1 - Never  Interpreter Needed?: No  Information entered by :: Lanier Ensign, LPN   Activities of Daily Living    02/09/2023   11:43 AM 05/04/2022    8:34 PM  In your present state of health, do you have any difficulty performing the following activities:  Hearing? 0 0  Vision? 0 0  Difficulty concentrating or making decisions? 0 0  Walking or climbing stairs? 0 0  Dressing or bathing? 0 0  Doing errands, shopping? 0 0  Preparing Food and  eating ? N   Using the Toilet? N   In the past six months, have you accidently leaked urine? N   Do you have problems with loss of bowel control? N   Managing your Medications? N   Managing your Finances? N   Housekeeping or managing your  Housekeeping? N     Patient Care Team: Shelva Majestic, MD as PCP - General (Family Medicine) Chilton Si, MD as PCP - Cardiology (Cardiology) Tarry Kos, MD as Attending Physician (Orthopedic Surgery) Marcello Fennel, MD as Consulting Physician (Physical Medicine and Rehabilitation) Sallye Lat, MD as Consulting Physician (Ophthalmology) Drema Dallas, DO as Consulting Physician (Neurology) Erroll Luna, [facility] (Inactive) as Pharmacist (Pharmacist)  Indicate any recent Medical Services you may have received from other than [facility] providers in the past year (date may be approximate).     Assessment:   This is a routine wellness examination for Beverlie.  Hearing/Vision screen Hearing Screening - Comments:: Pt denies any hearing issues  Vision Screening - Comments:: Pt follows up with Dr Dione Booze for annual eye exams    Goals Addressed             This Visit's Progress    Patient Stated       Maintain health and activity        Depression Screen    02/09/2023   11:46 AM 06/30/2022   11:06 AM 04/26/2022   10:59 AM 02/22/2022   11:13 AM 02/03/2022    1:03 PM 12/28/2021   11:03 AM 11/02/2021   10:17 AM  PHQ 2/9 Scores  PHQ - 2 Score 0 0 0 0 0 0 0    Fall Risk    02/09/2023   11:49 AM 06/30/2022   11:06 AM 04/26/2022   10:59 AM 02/22/2022   11:13 AM 02/03/2022    1:08 PM  Fall Risk   Falls in the past year? 0 0 0 0 0  Number falls in past yr: 0 0 0  0  Injury with Fall? 0 0 0  0  Risk for fall due to : Impaired vision    Impaired balance/gait;Impaired vision  Follow up Falls prevention discussed    Falls prevention discussed    MEDICARE RISK AT HOME: Medicare Risk at Home Any stairs in or around the home?: No If so, are there any without handrails?: No Home free of loose throw rugs in walkways, pet beds, electrical cords, etc?: Yes Adequate lighting in your home to reduce risk of falls?: Yes Life alert?: Yes Use of a cane, walker or w/c?:  Yes Grab bars in the bathroom?: Yes Shower chair or bench in shower?: Yes Elevated toilet seat or a handicapped toilet?: Yes  TIMED UP AND GO:  Was the test performed?  No    Cognitive Function:        02/09/2023   11:50 AM 02/03/2022    1:10 PM 01/21/2021   11:10 AM 01/16/2020   10:32 AM  6CIT Screen  What Year? 0 points 0 points 0 points 0 points  What month? 0 points 0 points 0 points 0 points  What time? 0 points 0 points 0 points   Count back from 20 0 points 0 points 0 points 0 points  Months in reverse 0 points 0 points 0 points 0 points  Repeat phrase 0 points 0 points 2 points 4 points  Total Score 0 points 0 points 2 points     Immunizations Immunization History  Administered Date(s) Administered   Fluad Quad(high Dose 65+) 01/14/2019, 02/12/2020, 01/21/2021   Influenza Split 12/25/2011   Influenza Whole 01/23/2010   Influenza,inj,Quad PF,6+ Mos 12/31/2012, 12/26/2014, 01/08/2016   Influenza-Unspecified 01/23/2014, 12/25/2017, 12/28/2021   PFIZER Comirnaty(Gray Top)Covid-19 Tri-Sucrose Vaccine 02/18/2022   PFIZER(Purple Top)SARS-COV-2 Vaccination 05/14/2019, 06/03/2019, 01/22/2020, 08/05/2020   PPD Test 06/24/2011   Pneumococcal Conjugate-13 08/31/2015   Pneumococcal Polysaccharide-23 04/25/2005   Td 04/25/2005   Tdap 01/14/2019   Zoster Recombinant(Shingrix) 04/20/2017, 06/29/2017   Zoster, Live 03/02/2010    TDAP status: Up to date  Flu Vaccine status: Due, Education has been provided regarding the importance of this vaccine. Advised may receive this vaccine at local pharmacy or Health Dept. Aware to provide a copy of the vaccination record if obtained from local pharmacy or Health Dept. Verbalized acceptance and understanding.  Pneumococcal vaccine status: Up to date  Covid-19 vaccine status: Information provided on how to obtain vaccines.   Qualifies for Shingles Vaccine? Yes   Zostavax completed Yes   Shingrix Completed?: Yes  Screening  Tests Health Maintenance  Topic Date Due   COVID-19 Vaccine (6 - 2023-24 season) 12/25/2022   INFLUENZA VACCINE  07/24/2023 (Originally 11/24/2022)   Medicare Annual Wellness (AWV)  02/09/2024   DTaP/Tdap/Td (3 - Td or Tdap) 01/13/2029   Pneumonia Vaccine 38+ Years old  Completed   DEXA SCAN  Completed   Zoster Vaccines- Shingrix  Completed   HPV VACCINES  Aged Out    Health Maintenance  Health Maintenance Due  Topic Date Due   COVID-19 Vaccine (6 - 2023-24 season) 12/25/2022    Colorectal cancer screening: No longer required.   Mammogram status: No longer required due to age .  Bone Density status: Completed 04/26/08. Results reflect: Bone density results: OSTEOPENIA. Repeat every 2 years.  Additional Screening:  Vision Screening: Recommended annual ophthalmology exams for early detection of glaucoma and other disorders of the eye. Is the patient up to date with their annual eye exam?  Yes  Who is the provider or what is the name of the office in which the patient attends annual eye exams? Dr Dione Booze  If pt is not established with a provider, would they like to be referred to a provider to establish care? No .   Dental Screening: Recommended annual dental exams for proper oral hygiene    Community Resource Referral / Chronic Care Management: CRR required this visit?  No   CCM required this visit?  No     Plan:     I have personally reviewed and noted the following in the patient's chart:   Medical and social history Use of alcohol, tobacco or illicit drugs  Current medications and supplements including opioid prescriptions. Patient is not currently taking opioid prescriptions. Functional ability and status Nutritional status Physical activity Advanced directives List of other physicians Hospitalizations, surgeries, and ER visits in previous 12 months Vitals Screenings to include cognitive, depression, and falls Referrals and appointments  In addition, I have  reviewed and discussed with patient certain preventive protocols, quality metrics, and best practice recommendations. A written personalized care plan for preventive services as well as general preventive health recommendations were provided to patient.     Marzella Schlein, LPN   29/52/[id number]   After Visit Summary Pt declined will follow up on my chart   Nurse Notes: none

## 2023-02-27 ENCOUNTER — Ambulatory Visit: Payer: Medicare Other | Admitting: Physical Therapy

## 2023-03-01 ENCOUNTER — Ambulatory Visit: Payer: Medicare Other | Admitting: Physical Therapy

## 2023-03-03 ENCOUNTER — Other Ambulatory Visit: Payer: Self-pay

## 2023-03-03 DIAGNOSIS — I739 Peripheral vascular disease, unspecified: Secondary | ICD-10-CM

## 2023-03-14 ENCOUNTER — Ambulatory Visit (HOSPITAL_COMMUNITY)
Admission: RE | Admit: 2023-03-14 | Discharge: 2023-03-14 | Disposition: A | Discharge status: Home / Self Care | Payer: Medicare Other | Source: Ambulatory Visit | Attending: Vascular Surgery | Admitting: Vascular Surgery

## 2023-03-14 ENCOUNTER — Ambulatory Visit: Payer: Medicare Other | Admitting: Vascular Surgery

## 2023-03-14 ENCOUNTER — Ambulatory Visit (INDEPENDENT_AMBULATORY_CARE_PROVIDER_SITE_OTHER)
Admission: RE | Admit: 2023-03-14 | Discharge: 2023-03-14 | Disposition: A | Discharge status: Home / Self Care | Payer: Medicare Other | Source: Ambulatory Visit | Attending: Surgery | Admitting: Surgery

## 2023-03-14 DIAGNOSIS — I739 Peripheral vascular disease, unspecified: Secondary | ICD-10-CM | POA: Insufficient documentation

## 2023-03-14 LAB — VAS US ABI WITH/WO TBI
Left ABI: 1.29
Right ABI: 1

## 2023-03-21 ENCOUNTER — Ambulatory Visit (INDEPENDENT_AMBULATORY_CARE_PROVIDER_SITE_OTHER): Payer: Medicare Other | Admitting: Vascular Surgery

## 2023-03-21 ENCOUNTER — Encounter: Payer: Self-pay | Admitting: Vascular Surgery

## 2023-03-21 VITALS — BP 162/96 | HR 91 | Temp 97.3°F | Ht 59.0 in | Wt 170.0 lb

## 2023-03-21 DIAGNOSIS — I739 Peripheral vascular disease, unspecified: Secondary | ICD-10-CM

## 2023-03-21 NOTE — Progress Notes (Signed)
Patient name: [name] [name] MRN: [id number] DOB: 1925-02-08 Sex: female  REASON FOR VISIT: 1 year follow-up for surveillance of bilateral lower extremity bypass grafts  HPI: [name] [name] is a 89 y.o. female with multiple medical problems as listed below that presents for 1 year follow-up for surveillance of her bilateral lower extremity bypasses. No new lower extremity complaints today.  Patient was previously under the care of Dr. Darrick Penna who retired from our practice.  Most recently she underwent a redo right common femoral to below-knee popliteal bypass with PTFE and a left common femoral endarterectomy and bovine patch on 12/09/2019.    Prior to this she had a left common femoral to above-knee popliteal bypass in 2006 by Dr. Orson Slick.  She had a right common femoral to above-knee popliteal bypass also in 2006 by Dr. Orson Slick it was extended below the knee in 2010.  Past Medical History:  Diagnosis Date   Acute on chronic diastolic heart failure (HCC) 12/30/2020   Allergic rhinoconjunctivitis    Allergy    Anxiety    pt. managed- uses deep breathing    Arthritis    low back , stenosis   COLONIC POLYPS, RECURRENT 08/29/2006   2008 last colonoscopy. No further colonoscopy.      COPD (chronic obstructive pulmonary disease) (HCC)    Diverticulosis    Dysrhythmia    afib   Eczema    Environmental allergies    allergy shot- q friday in Dr. Roxy Cedar office. PFT's abnormal- recommended Spiriva to use preop & will d/c after surgery   GERD (gastroesophageal reflux disease)    Hiatal hernia    Hyperlipidemia    Hypertension    Lung nodule 2011   Paroxysmal atrial fibrillation (HCC) 11/27/2019   Peripheral vascular disease (HCC)    Shingles    Stenosis of popliteal artery (HCC)    blood clots in legs long ago     Trigeminal neuralgia    Left buttocks    Past Surgical History:  Procedure Laterality Date   ABDOMINAL AORTAGRAM N/A 06/17/2011   Procedure: ABDOMINAL Ronny Flurry;  Surgeon:  Sherren Kerns, MD;  Location: [facility] CATH LAB;  Service: Cardiovascular;  Laterality: N/A;   ABDOMINAL AORTOGRAM W/LOWER EXTREMITY Bilateral 06/22/2018   Procedure: ABDOMINAL AORTOGRAM W/LOWER EXTREMITY;  Surgeon: Sherren Kerns, MD;  Location: [facility] INVASIVE CV LAB;  Service: Cardiovascular;  Laterality: Bilateral;   ABDOMINAL AORTOGRAM W/LOWER EXTREMITY Bilateral 11/29/2019   Procedure: ABDOMINAL AORTOGRAM W/LOWER EXTREMITY;  Surgeon: Sherren Kerns, MD;  Location: [facility] INVASIVE CV LAB;  Service: Cardiovascular;  Laterality: Bilateral;   CATARACT EXTRACTION, BILATERAL     w IOL   CHOLECYSTECTOMY  1998   COLONOSCOPY     ENDARTERECTOMY FEMORAL Left 12/09/2019   Procedure: ENDARTERECTOMY FEMORAL with bovine patch angioplasty.;  Surgeon: Sherren Kerns, MD;  Location: [facility] OR;  Service: Vascular;  Laterality: Left;   FEMORAL-POPLITEAL BYPASS GRAFT        x2 surgeries 1990's & 2009   FEMORAL-POPLITEAL BYPASS GRAFT Right 12/09/2019   Procedure: REDO RIGHT FEMORAL-POPLITEAL ARTERY BYPASS GRAFT;  Surgeon: Sherren Kerns, MD;  Location: [facility] OR;  Service: Vascular;  Laterality: Right;   INJECTION KNEE Right Aug. 2016   Gel injection for pain   LUMBAR FUSION  07/06/2011   TONSILLECTOMY     as a teenager    TUBAL LIGATION      Family History  Problem Relation Age of Onset   Arthritis Mother    Hypertension Mother  Heart disease Father    Colon polyps Father    Breast cancer Sister    Breast cancer Daughter    Hypertension Son    Lung cancer Brother    Colon cancer Sister    Brain cancer Sister    Lung cancer Sister    Anesthesia problems Neg Hx    Hypotension Neg Hx    Malignant hyperthermia Neg Hx    Pseudochol deficiency Neg Hx     SOCIAL HISTORY: Social History   Tobacco Use   Smoking status: Former    Current packs/day: 0.00    Average packs/day: 2.0 packs/day for 30.0 years (60.0 ttl pk-yrs)    Types: Cigarettes    Start date: 06/19/1963    Quit date: 06/18/1993    Years  since quitting: 29.7   Smokeless tobacco: Never   Tobacco comments:    QUIT IN 1995  Substance Use Topics   Alcohol use: No    Alcohol/week: 0.0 standard drinks of alcohol    Allergies  Allergen Reactions   Sulfa Antibiotics Other (See Comments)    Cold sweat light headed and disorientation   Tiotropium Bromide Shortness Of Breath and Other (See Comments)    Sore throat also   Latex Itching and Rash   Ultram [Tramadol] Nausea And Vomiting   5-Alpha Reductase Inhibitors     Current Outpatient Medications  Medication Sig Dispense Refill   acetaminophen (TYLENOL) 500 MG tablet Take 2 tablets (1,000 mg total) by mouth 2 (two) times daily. 30 tablet 0   apixaban (ELIQUIS) 5 MG TABS tablet Take 1 tablet by mouth twice daily 180 tablet 1   atorvastatin (LIPITOR) 40 MG tablet Take 1 tablet by mouth once daily 90 tablet 0   diclofenac Sodium (VOLTAREN) 1 % GEL Apply 2 g topically 4 (four) times daily.     famotidine (PEPCID) 20 MG tablet Take 20mg  in the evening for 1 week. Then take as needed for indigestion. 30 tablet 2   furosemide (LASIX) 40 MG tablet Take 1 tablet by mouth once daily 90 tablet 1   ipratropium (ATROVENT) 0.06 % nasal spray USE 2 SPRAY(S) IN EACH NOSTRIL TWICE DAILY 15 mL 0   lidocaine (LIDODERM) 5 % Place 1 patch onto the skin daily. Remove & Discard patch within 12 hours or as directed by MD 90 patch 3   lisinopril (ZESTRIL) 5 MG tablet Take 1 tablet by mouth once daily 90 tablet 3   meclizine (ANTIVERT) 25 MG tablet TAKE 1/2 (ONE-HALF) TABLET BY MOUTH THREE TIMES DAILY AS NEEDED FOR VERTIGO 30 tablet 0   melatonin 3 MG TABS tablet Take 2 tablets (6 mg total) by mouth at bedtime as needed (sleep).  0   metoprolol succinate (TOPROL-XL) 100 MG 24 hr tablet TAKE 1 TABLET BY MOUTH ONCE DAILY. TAKE WITH OR IMMEDIATELY FOLLOWING A MEAL 90 tablet 1   Multiple Vitamin (MULTIVITAMIN WITH MINERALS) TABS tablet Take 1 tablet by mouth daily.     pantoprazole (PROTONIX) 40 MG  tablet TAKE 1 TABLET BY MOUTH TWICE DAILY AS DIRECTED 180 tablet 3   RESTASIS 0.05 % ophthalmic emulsion Place 1 drop into both eyes 2 (two) times daily as needed (dry eyes).   99   senna-docusate (SENOKOT-S) 8.6-50 MG tablet Take 1 tablet by mouth at bedtime as needed for mild constipation.     No current facility-administered medications for this visit.    REVIEW OF SYSTEMS:  [X]  denotes positive finding, [ ]  denotes negative finding  Cardiac  Comments:  Chest pain or chest pressure:    Shortness of breath upon exertion:    Short of breath when lying flat:    Irregular heart rhythm:        Vascular    Pain in calf, thigh, or hip brought on by ambulation:    Pain in feet at night that wakes you up from your sleep:     Blood clot in your veins:    Leg swelling:         Pulmonary    Oxygen at home:    Productive cough:     Wheezing:         Neurologic    Sudden weakness in arms or legs:     Sudden numbness in arms or legs:     Sudden onset of difficulty speaking or slurred speech:    Temporary loss of vision in one eye:     Problems with dizziness:         Gastrointestinal    Blood in stool:     Vomited blood:         Genitourinary    Burning when urinating:     Blood in urine:        Psychiatric    Major depression:         Hematologic    Bleeding problems:    Problems with blood clotting too easily:        Skin    Rashes or ulcers:        Constitutional    Fever or chills:      PHYSICAL EXAM: Vitals:   03/21/23 1222  BP: (!) 162/96  Pulse: 91  Temp: (!) 97.3 F (36.3 C)  TempSrc: Temporal  SpO2: 93%  Weight: 170 lb (77.1 kg)  Height: 4\' 11"  (1.499 m)    GENERAL: The patient is a well-nourished female, in no acute distress. The vital signs are documented above. CARDIAC: There is a regular rate and rhythm.  VASCULAR:  Bilateral femoral pulses palpable Bilateral DP PT signals brisk by Doppler PULMONARY: No respiratory distress ABDOMEN: Soft and  non-tender. MUSCULOSKELETAL: There are no major deformities or cyanosis. NEUROLOGIC: No focal weakness or paresthesias are detected. SKIN: There are no ulcers or rashes noted. PSYCHIATRIC: The patient has a normal affect.  DATA:   ABI 1.0 right biphasic and 1.29 left biphasic - stable over past year.   Bilateral lower extremity bypasses are patent on duplex with no stenosis visualized  Assessment/Plan:  89 year old female presents for surveillance of her bilateral lower extremity bypass grafts.  Most recently she underwent a redo right common femoral to below-knee popliteal bypass with PTFE and also a left common femoral endarterectomy with bovine patch by Dr. Darrick Penna on 12/09/2019 (this is in the setting of a previous left common femoral to above-knee popliteal bypass by Dr. Orson Slick).  Discussed both of her bypass grafts are patent with no stenosis visualized on duplex.  She has no lower extremity complaints.  We will plan next follow-up in 1 year with bilateral lower extremity arterial duplexes and ABIs for ongoing surveillance.   Cephus Shelling, MD [facility] Office: [phone]

## 2023-03-28 ENCOUNTER — Other Ambulatory Visit: Payer: Self-pay

## 2023-03-28 DIAGNOSIS — I739 Peripheral vascular disease, unspecified: Secondary | ICD-10-CM

## 2023-04-01 ENCOUNTER — Other Ambulatory Visit: Payer: Self-pay | Admitting: Family Medicine

## 2023-05-04 ENCOUNTER — Telehealth: Payer: Self-pay | Admitting: Orthopaedic Surgery

## 2023-05-04 NOTE — Telephone Encounter (Signed)
 Patient called. She would like a gel injection. Would like a call back [phone]

## 2023-05-09 ENCOUNTER — Telehealth: Payer: Self-pay | Admitting: Orthopaedic Surgery

## 2023-05-09 NOTE — Telephone Encounter (Signed)
 VOB submitted for Monovisc, right knee

## 2023-05-09 NOTE — Telephone Encounter (Signed)
 See previous message

## 2023-05-09 NOTE — Telephone Encounter (Signed)
 Patient called needing to get the gel injection again in her right knee. The number to contact patient is [phone]

## 2023-05-20 ENCOUNTER — Other Ambulatory Visit (HOSPITAL_BASED_OUTPATIENT_CLINIC_OR_DEPARTMENT_OTHER): Payer: Self-pay | Admitting: Cardiovascular Disease

## 2023-05-26 ENCOUNTER — Other Ambulatory Visit (HOSPITAL_BASED_OUTPATIENT_CLINIC_OR_DEPARTMENT_OTHER): Payer: Self-pay | Admitting: Cardiovascular Disease

## 2023-05-30 ENCOUNTER — Telehealth: Payer: Self-pay | Admitting: Orthopedic Surgery

## 2023-05-30 NOTE — Telephone Encounter (Signed)
[name] [name] is calling back to check on the status of the authorization for Monovisc in her right knee.  Looks like benefits were checked in January. She would like to get on the schedule soon.

## 2023-05-30 NOTE — Telephone Encounter (Signed)
Noted. Will call patient to schedule.

## 2023-05-31 ENCOUNTER — Encounter: Payer: Self-pay | Admitting: Physical Therapy

## 2023-05-31 ENCOUNTER — Encounter: Payer: Self-pay | Admitting: Family Medicine

## 2023-05-31 ENCOUNTER — Ambulatory Visit: Payer: Medicare Other | Admitting: Family Medicine

## 2023-05-31 VITALS — BP 110/70 | HR 88 | Temp 97.5°F | Ht 59.0 in | Wt 169.0 lb

## 2023-05-31 DIAGNOSIS — E88819 Insulin resistance, unspecified: Secondary | ICD-10-CM | POA: Diagnosis not present

## 2023-05-31 DIAGNOSIS — J449 Chronic obstructive pulmonary disease, unspecified: Secondary | ICD-10-CM | POA: Diagnosis not present

## 2023-05-31 DIAGNOSIS — Z79899 Other long term (current) drug therapy: Secondary | ICD-10-CM

## 2023-05-31 DIAGNOSIS — I5032 Chronic diastolic (congestive) heart failure: Secondary | ICD-10-CM | POA: Diagnosis not present

## 2023-05-31 DIAGNOSIS — Z131 Encounter for screening for diabetes mellitus: Secondary | ICD-10-CM | POA: Diagnosis not present

## 2023-05-31 DIAGNOSIS — N183 Chronic kidney disease, stage 3 unspecified: Secondary | ICD-10-CM | POA: Diagnosis not present

## 2023-05-31 DIAGNOSIS — E7800 Pure hypercholesterolemia, unspecified: Secondary | ICD-10-CM

## 2023-05-31 DIAGNOSIS — I4819 Other persistent atrial fibrillation: Secondary | ICD-10-CM | POA: Diagnosis not present

## 2023-05-31 LAB — HEMOGLOBIN A1C: Hgb A1c MFr Bld: 6.3 % (ref 4.6–6.5)

## 2023-05-31 LAB — COMPREHENSIVE METABOLIC PANEL
ALT: 10 U/L (ref 0–35)
AST: 17 U/L (ref 0–37)
Albumin: 4.3 g/dL (ref 3.5–5.2)
Alkaline Phosphatase: 72 U/L (ref 39–117)
BUN: 29 mg/dL — ABNORMAL HIGH (ref 6–23)
CO2: 28 meq/L (ref 19–32)
Calcium: 9 mg/dL (ref 8.4–10.5)
Chloride: 103 meq/L (ref 96–112)
Creatinine, Ser: 1.28 mg/dL — ABNORMAL HIGH (ref 0.40–1.20)
GFR: 35.35 mL/min — ABNORMAL LOW (ref >60.00)
Glucose, Bld: 123 mg/dL — ABNORMAL HIGH (ref 70–99)
Potassium: 3.8 meq/L (ref 3.5–5.1)
Sodium: 142 meq/L (ref 135–145)
Total Bilirubin: 0.7 mg/dL (ref 0.2–1.2)
Total Protein: 6.6 g/dL (ref 6.0–8.3)

## 2023-05-31 LAB — CBC WITH DIFFERENTIAL/PLATELET
Basophils Absolute: 0.1 10*3/uL (ref 0.0–0.1)
Basophils Relative: 0.6 % (ref 0.0–3.0)
Eosinophils Absolute: 0.2 10*3/uL (ref 0.0–0.7)
Eosinophils Relative: 1.9 % (ref 0.0–5.0)
HCT: 38.6 % (ref 36.0–46.0)
Hemoglobin: 12.9 g/dL (ref 12.0–15.0)
Lymphocytes Relative: 23.8 % (ref 12.0–46.0)
Lymphs Abs: 2.3 10*3/uL (ref 0.7–4.0)
MCHC: 33.4 g/dL (ref 30.0–36.0)
MCV: 86.9 fL (ref 78.0–100.0)
Monocytes Absolute: 0.6 10*3/uL (ref 0.1–1.0)
Monocytes Relative: 6.2 % (ref 3.0–12.0)
Neutro Abs: 6.5 10*3/uL (ref 1.4–7.7)
Neutrophils Relative %: 67.5 % (ref 43.0–77.0)
Platelets: 174 10*3/uL (ref 150.0–400.0)
RBC: 4.45 Mil/uL (ref 3.87–5.11)
RDW: 14.6 % (ref 11.5–15.5)
WBC: 9.7 10*3/uL (ref 4.0–10.5)

## 2023-05-31 LAB — LIPID PANEL
Cholesterol: 119 mg/dL (ref 0–200)
HDL: 59.8 mg/dL (ref >39.00)
LDL Cholesterol: 37 mg/dL (ref 0–99)
NonHDL: 59.65
Total CHOL/HDL Ratio: 2
Triglycerides: 111 mg/dL (ref 0.0–149.0)
VLDL: 22.2 mg/dL (ref 0.0–40.0)

## 2023-05-31 LAB — VITAMIN B12: Vitamin B-12: 719 pg/mL (ref 211–911)

## 2023-05-31 NOTE — Progress Notes (Signed)
 Phone [phone] In person visit   Subjective:   [name] [name] is a 89 y.o. year old very pleasant female patient who presents for/with See problem oriented charting Chief Complaint  Patient presents with   Medical Management of Chronic Issues   Hypertension   Hyperlipidemia   Past Medical History-  Patient Active Problem List   Diagnosis Date Noted   Chronic diastolic heart failure (HCC) 12/30/2020    Priority: High   Status post femoral-popliteal bypass surgery 12/09/2019    Priority: High   Persistent atrial fibrillation (HCC) 12/03/2019    Priority: High   Pain in right ankle and joints of right foot 08/28/2014    Priority: High   Peripheral arterial disease (HCC) 06/29/2007    Priority: High   Vertigo 05/04/2022    Priority: Medium    Left groin hernia 05/22/2020    Priority: Medium    CKD (chronic kidney disease), stage III (HCC) 08/07/2017    Priority: Medium    Acute gout of left foot 03/22/2016    Priority: Medium    Trigeminal neuralgia     Priority: Medium    Chronic low back pain 09/13/2010    Priority: Medium    COPD mixed type (HCC) 01/22/2010    Priority: Medium    Insulin resistance 01/06/2009    Priority: Medium    Hyperlipidemia 10/23/2006    Priority: Medium    Essential hypertension 10/23/2006    Priority: Medium    ESOPHAGEAL STRICTURE 08/09/1993    Priority: Medium    Secondary hypercoagulable state (HCC) 12/03/2019    Priority: Low   Syncope 12/29/2011    Priority: Low   Allergic rhinitis due to pollen 06/25/2010    Priority: Low   Solitary pulmonary nodule 01/12/2010    Priority: Low   Osteopenia 01/06/2009    Priority: Low   Eczema 11/26/2007    Priority: Low   Chronic bilateral low back pain with right-sided sciatica 08/29/2019    Priority: 1.   Unilateral primary osteoarthritis, right knee 04/07/2016    Priority: 1.   Mild sleep apnea 05/03/2021   Anxiety state    Rectal bleeding    Ischemic colitis (HCC)    Gross  hematuria 06/01/2018    Medications- reviewed and updated Current Outpatient Medications  Medication Sig Dispense Refill   acetaminophen  (TYLENOL ) 500 MG tablet Take 2 tablets (1,000 mg total) by mouth 2 (two) times daily. 30 tablet 0   apixaban  (ELIQUIS ) 5 MG TABS tablet Take 1 tablet by mouth twice daily 180 tablet 1   atorvastatin  (LIPITOR) 40 MG tablet Take 1 tablet by mouth once daily 90 tablet 0   diclofenac  Sodium (VOLTAREN ) 1 % GEL Apply 2 g topically 4 (four) times daily.     famotidine  (PEPCID ) 20 MG tablet Take 20mg  in the evening for 1 week. Then take as needed for indigestion. 30 tablet 2   furosemide  (LASIX ) 40 MG tablet Take 1 tablet by mouth once daily 90 tablet 3   ipratropium (ATROVENT ) 0.06 % nasal spray USE 2 SPRAY(S) IN EACH NOSTRIL TWICE DAILY 15 mL 0   lidocaine  (LIDODERM ) 5 % Place 1 patch onto the skin daily. Remove & Discard patch within 12 hours or as directed by MD 90 patch 3   lisinopril  (ZESTRIL ) 5 MG tablet Take 1 tablet by mouth once daily 90 tablet 3   meclizine  (ANTIVERT ) 25 MG tablet TAKE 1/2 (ONE-HALF) TABLET BY MOUTH THREE TIMES DAILY AS NEEDED FOR VERTIGO 30 tablet  0   melatonin 3 MG TABS tablet Take 2 tablets (6 mg total) by mouth at bedtime as needed (sleep).  0   metoprolol  succinate (TOPROL -XL) 100 MG 24 hr tablet TAKE 1 TABLET BY MOUTH ONCE DAILY WITH OR IMMEDIATELY FOLLOWING A MEAL 90 tablet 3   Multiple Vitamin (MULTIVITAMIN WITH MINERALS) TABS tablet Take 1 tablet by mouth daily.     pantoprazole  (PROTONIX ) 40 MG tablet TAKE 1 TABLET BY MOUTH TWICE DAILY AS DIRECTED 180 tablet 3   RESTASIS 0.05 % ophthalmic emulsion Place 1 drop into both eyes 2 (two) times daily as needed (dry eyes).   99   senna-docusate (SENOKOT-S) 8.6-50 MG tablet Take 1 tablet by mouth at bedtime as needed for mild constipation.     No current facility-administered medications for this visit.     Objective:  BP 110/70   Pulse 88   Temp (!) 97.5 F (36.4 C)   Ht 4' 11  (1.499 m)   Wt 169 lb (76.7 kg)   SpO2 97%   BMI 34.13 kg/m  Gen: NAD, resting comfortably CV: RRR no murmurs rubs or gallops Lungs: CTAB no crackles, wheeze, rhonchi Ext: trace edema worse on the left Skin: warm, dry     Assessment and Plan   #social update- drove herself today again. Walking with walker -trying acupuncture for trigeminal neuralgia- found helpful -lives by herself but children incredibly helpful! She is so thankful as am I !  #PAD-actually on warfarin for arterial thrombosis in the past now on Eliquis  for A. fib as well. Dr. Harvey (now Dr. Gretta) ok with compression stockings #Hyperlipidemia  S: Compliant with atorvastatin  40 mg.  LDL goal 70 or less due to arterial disease. -recent visit with Dr. Gretta and good report- yearly follow up  Lab Results  Component Value Date   CHOL 106 05/06/2022   HDL 46 05/06/2022   LDLCALC 37 05/06/2022   LDLDIRECT 46.0 06/10/2019   TRIG 116 05/06/2022   CHOLHDL 2.3 05/06/2022  A/P: peripheral arterial disease asymptomatic - continue current medications  Lipids at goal last year- update today- continue current medications    # Atrial fibrillation S: Rate controlled with metoprolol  100 mg extended release Anticoagulated with Eliquis  5 mg twice daily- reevaluate dose if cr raises above 1.5   A/P: appropriately anticoagulated and rate controlled- continue current medicine   #Diastolic CHF- sees dr. raford #Essential hypertension S: medication: Lisinopril  5 mg, metoprolol  100 mg XR and lasix  40 mg daily for most part -reports weights stable at home- swelling perhaps mildly less - able to do some exercise A/P: CHF euvolemic/doing well- continue current medications     Hypertension- doing well today- usually has white coat hypertension but doing better today- continue current medications   #Chronic kidney disease stage III S: Patient's GFR has been stable in the 30s in 2023.  She knows to avoid NSAIDs A/P:creatinine  stable at 1.22 last visit- update with labs- likely slightly worse than a few years ago with more consistent lasix   #Insulin resistance/obesity/prediabetes- peak a1c 6.2 in 2021- has reduced sugar- update a1c and monitor this today Lab Results  Component Value Date   HGBA1C 6.0 (H) 05/05/2022   HGBA1C 6.1 08/20/2020   HGBA1C 5.6 02/19/2020   #recently started multivitamin with B12- is on pantoprazole  so we will check B12 levels to make sure still adequate Lab Results  Component Value Date   VITAMINB12 444 08/20/2020   Recommended follow up: Return in about 6 months (  around 11/28/2023) for followup or sooner if needed.Schedule b4 you leave. Future Appointments  Date Time Provider Department Center  06/06/2023  8:20 AM Raford Riggs, MD [facility]  02/15/2024 11:15 AM [facility] ANNUAL WELLNESS VISIT 1 [facility]   Lab/Order associations: quiche today   ICD-10-CM   1. Pure hypercholesterolemia  E78.00 Comprehensive metabolic panel    CBC with Differential/Platelet    Lipid panel    2. Chronic diastolic heart failure (HCC) Chronic I50.32     3. COPD mixed type (HCC) Chronic J44.9     4. Persistent atrial fibrillation (HCC) Chronic I48.19     5. Stage 3 chronic kidney disease, unspecified whether stage 3a or 3b CKD (HCC) Chronic N18.30     6. Insulin resistance  E88.819 Hemoglobin A1c    7. Screening for diabetes mellitus  Z13.1 Hemoglobin A1c    8. High risk medication use  Z79.899 Vitamin B12      No orders of the defined types were placed in this encounter.   Return precautions advised.  Garnette Lukes, MD

## 2023-05-31 NOTE — Patient Instructions (Addendum)
 Please stop by lab before you go If you have mychart- we will send your results within 3 business days of us  receiving them.  If you do not have mychart- we will call you about results within 5 business days of us  receiving them.  *please also note that you will see labs on mychart as soon as they post. I will later go in and write notes on them- will say notes from Dr. Katrinka   Recommended follow up: Return in about 6 months (around 11/28/2023) for followup or sooner if needed.Schedule b4 you leave.

## 2023-05-31 NOTE — Telephone Encounter (Signed)
 Called and left a VM for patient to CB to schedule for gel injection with Dr. Christiane Cowing or Loris Ros.

## 2023-06-01 ENCOUNTER — Encounter: Payer: Self-pay | Admitting: Family Medicine

## 2023-06-01 ENCOUNTER — Other Ambulatory Visit: Payer: Self-pay

## 2023-06-01 DIAGNOSIS — M1711 Unilateral primary osteoarthritis, right knee: Secondary | ICD-10-CM

## 2023-06-06 ENCOUNTER — Encounter (HOSPITAL_BASED_OUTPATIENT_CLINIC_OR_DEPARTMENT_OTHER): Payer: Self-pay | Admitting: Cardiovascular Disease

## 2023-06-06 ENCOUNTER — Ambulatory Visit (INDEPENDENT_AMBULATORY_CARE_PROVIDER_SITE_OTHER): Payer: Medicare Other | Admitting: Physician Assistant

## 2023-06-06 ENCOUNTER — Ambulatory Visit (INDEPENDENT_AMBULATORY_CARE_PROVIDER_SITE_OTHER): Payer: Medicare Other | Admitting: Cardiovascular Disease

## 2023-06-06 VITALS — BP 128/66 | HR 81 | Ht 59.0 in | Wt 168.1 lb

## 2023-06-06 DIAGNOSIS — I4819 Other persistent atrial fibrillation: Secondary | ICD-10-CM | POA: Diagnosis not present

## 2023-06-06 DIAGNOSIS — I10 Essential (primary) hypertension: Secondary | ICD-10-CM

## 2023-06-06 DIAGNOSIS — I5032 Chronic diastolic (congestive) heart failure: Secondary | ICD-10-CM

## 2023-06-06 DIAGNOSIS — M1711 Unilateral primary osteoarthritis, right knee: Secondary | ICD-10-CM | POA: Diagnosis not present

## 2023-06-06 DIAGNOSIS — E7800 Pure hypercholesterolemia, unspecified: Secondary | ICD-10-CM | POA: Diagnosis not present

## 2023-06-06 DIAGNOSIS — I739 Peripheral vascular disease, unspecified: Secondary | ICD-10-CM

## 2023-06-06 MED ORDER — LIDOCAINE HCL 1 % IJ SOLN
2.0000 mL | INTRAMUSCULAR | Status: AC | PRN
Start: 2023-06-06 — End: 2023-06-06
  Administered 2023-06-06: 2 mL

## 2023-06-06 MED ORDER — HYALURONAN 88 MG/4ML IX SOSY
88.0000 mg | PREFILLED_SYRINGE | INTRA_ARTICULAR | Status: AC | PRN
Start: 2023-06-06 — End: 2023-06-06
  Administered 2023-06-06: 88 mg via INTRA_ARTICULAR

## 2023-06-06 MED ORDER — BUPIVACAINE HCL 0.25 % IJ SOLN
2.0000 mL | INTRAMUSCULAR | Status: AC | PRN
  Administered 2023-06-06: 2 mL via INTRA_ARTICULAR

## 2023-06-06 NOTE — Progress Notes (Signed)
Cardiology Office Note:  .   Date:  06/06/2023  ID:  [name] [name], DOB 05-09-1924, MRN [id number] PCP: Shelva Majestic, MD  [facility] Providers Cardiologist:  Chilton Si, MD    History of Present Illness: .    [name] [name] is a 89 y.o. female with persistent atrial fibrillation, hypertension, hyperlipidemia, COPD, PAF, and PAD status post right femoral to above-knee popliteal bypass (2006) here for follow up.  She has a known stenosis of her peripheral bypass based on an arteriogram 05/2018 which revealed 80% narrowing of the mid right femoral-popliteal bypass graft.  She also had 70% stenosis of her left common femoral..  She previously deferred intervention because of concern about having an operation at her age.  However she has had increasing symptoms of claudication and last saw Dr. Darrick Penna on 7/29.  She previously underwent revision of her femoral to above-knee bypass in 2010.  This was extended to below the knee popliteal artery.  She is on chronic warfarin therapy since her thrombolysis procedure several years ago.  She is scheduled to undergo aortogram with lower extremity runoff on 11/29/2019.  She was referred to cardiology for presurgical risk assessment prior to redo of her right leg bypass on 8/16.  [name] [name] had a stress echo 12/2011 that revealed normal systolic function and no ischemia.   [name] [name] was seen in 2021 for preoperative risk assessment prior to redo femoral-popliteal bypass grafting.  At that appointment she was noted to be in atrial fibrillation, which was a new finding.  She was started on metoprolol for rate control and was already on warfarin.  After her surgery she developed rectal bleeding due to acute colitis.  That hospitalization was also complicated by atrial fibrillation with RVR.  She followed up with Edd Fabian, NP on 02/2020 and was doing well.  Prior to that appointment her furosemide was increased due to volume overload.  Echo 11/2019  revealed LVEF 60 to 65% with mild left atrial enlargement and no other significant abnormalities.  She had ABIs 04/2020 which were 0.97 on the right and 1.29 on the left.  Arterial Dopplers revealed patent bypass grafts without stenosis.   She followed up with vascular 04/2021 and was stable. BP was uncontrolled so metoprolol was increased and lisinopril was switched to Entresto. She was volume overloaded and Lasix was increased. Se felt poorly and switched Entresto back to Lisinopril. BP was in the 110-120s over 70s. She followed up with Gillian Shields NP on 11/04/2021 and was feeling better but reported frequent nocturia.   At her visit 01/2022 she was struggling with PAD symptoms.  She was treated for vertigo 04/2022.  She wore a monitor 09/2022 that revealed 100% atrial fibrillation with an average rate of 85 bpm.  [name] [name] is a 89 year old female with atrial fibrillation who presents with a sensation of heart beating hard and weakness. She is accompanied by her daughter, [name] [name].  She experiences a sensation of her heart beating hard, described as 'beating hard,' which returned unexpectedly this morning after a long absence. No associated pain is noted. Her heart rate has been previously checked and found to be in atrial fibrillation, which is her usual state. Blood pressure is generally stable, ranging from 120s/60s to 70s.  She feels weak and less energized over the past two to three weeks, without any associated pain. Shortness of breath occurs upon exertion and resolves with rest. She engages in some physical activity, such as exercises  during chores and occasionally riding a stationary bike for five to ten minutes, which leaves her very out of breath. She does not have a regular exercise routine but remains active in daily activities.  She experiences intermittent swelling, particularly in the left ankle, a recurring issue. She takes Lasix twice daily for fluid retention. Her daughter notes  improvement in her legs recently, although some swelling is present today, which is not unusual.  Her left arm experiences paresthesia if she lays on it or leans on it in a certain way, occurring when in bed or sitting in a recliner watching TV, suggesting a possible nerve issue.  Her blood sugar levels are a concern, with a recent A1c of 6.3, indicating prediabetes. She has a history of high sugar consumption and has made efforts to reduce sugar intake, which she believes has helped with her trigeminal nerve pain on the left side, previously treated successfully with acupuncture.      ROS:  As per HPI  Studies Reviewed: Marland Kitchen   EKG Interpretation Date/Time:  Tuesday June 06 2023 08:33:03 EST Ventricular Rate:  81 PR Interval:    QRS Duration:  62 QT Interval:  370 QTC Calculation: 429 R Axis:   -2  Text Interpretation: Atrial fibrillation Low voltage QRS When compared with ECG of 04-May-2022 12:05, PREVIOUS ECG IS PRESENT Confirmed by Chilton Si ([id number]) on 06/06/2023 8:42:34 AM   13 Day Zio Monitor 09/2022:   Quality: Fair.  Baseline artifact. Predominant rhythm: atrial fibrillation noted continuously Average heart rate: 85 bpm Max heart rate: 140 bpm Min heart rate: 48 bpm Pauses >2.5 seconds: none   Rare (<1%) PVCs 6 beats NSVT  Risk Assessment/Calculations:    CHA2DS2-VASc Score = 6   This indicates a 9.7% annual risk of stroke. The patient's score is based upon: CHF History: 1 HTN History: 1 Diabetes History: 0 Stroke History: 0 Vascular Disease History: 1 Age Score: 2 Gender Score: 1            Physical Exam:   VS:  BP 128/66 (BP Location: Left Arm, Patient Position: Sitting)   Pulse 81   Ht 4\' 11"  (1.499 m)   Wt 168 lb 1.6 oz (76.2 kg)   SpO2 97%   BMI 33.95 kg/m  , BMI Body mass index is 33.95 kg/m. GENERAL:  Well appearing HEENT: Pupils equal round and reactive, fundi not visualized, oral mucosa unremarkable NECK:  No jugular venous  distention, waveform within normal limits, carotid upstroke brisk and symmetric, no bruits, no thyromegaly LUNGS:  Clear to auscultation bilaterally HEART:  RRR.  PMI not displaced or sustained,S1 and S2 within normal limits, no S3, no S4, no clicks, no rubs, no murmurs ABD:  Flat, positive bowel sounds normal in frequency in pitch, no bruits, no rebound, no guarding, no midline pulsatile mass, no hepatomegaly, no splenomegaly EXT:  2 plus pulses throughout, no edema, no cyanosis no clubbing SKIN:  No rashes no nodules NEURO:  Cranial nerves II through XII grossly intact, motor grossly intact throughout PSYCH:  Cognitively intact, oriented to person place and time   ASSESSMENT AND PLAN: .    # Persistent Atrial Fibrillation:  Persistent AFib with occasional sensation of heart beating hard. Blood pressure controlled and heart rate within normal limits. -Continue current medications: Metoprolol and Eliquis -Monitor symptoms and consider adding as-needed Metoprolol if symptoms become more frequent.  She declines additional medication at this time.    # Hyperlipidemia:  Lipids are well-controlled on atorvastatin.   #  Hypertension:  BP controlled on lisinopril and metoprolol.  # Prediabetes A1c 6.3, indicating prediabetes. Patient has reduced sugar intake and is mindful of diet. -Continue current dietary modifications. -Encourage regular exercise, including use of stationary bike.  # Peripheral Edema Mild swelling noted in left ankle. Patient is on Lasix twice daily. -Continue Lasix as prescribed. -Monitor for worsening edema. Elevation and compression stockings.   # Trigeminal Neuralgia Previous episodes of trigeminal neuralgia managed with acupuncture. -Continue with acupuncture as needed for symptom management.  General Health Maintenance -Encourage regular exercise, including use of stationary bike. -Plan for annual follow-up, or sooner if any issues arise.             Dispo: f/u with Brayon Bielefeld C. Duke Salvia, MD, [facility] in 1 year.  Signed, Chilton Si, MD

## 2023-06-06 NOTE — Patient Instructions (Signed)
Medication Instructions:  Your physician recommends that you continue on your current medications as directed. Please refer to the Current Medication list given to you today.  *If you need a refill on your cardiac medications before your next appointment, please call your pharmacy*  Lab Work: NONE  Testing/Procedures: NONE  Follow-Up: At [facility], you and your health needs are our priority.  As part of our continuing mission to provide you with exceptional heart care, we have created designated Provider Care Teams.  These Care Teams include your primary Cardiologist (physician) and Advanced Practice Providers (APPs -  Physician Assistants and Nurse Practitioners) who all work together to provide you with the care you need, when you need it.  We recommend signing up for the patient portal called "MyChart".  Sign up information is provided on this After Visit Summary.  MyChart is used to connect with patients for Virtual Visits (Telemedicine).  Patients are able to view lab/test results, encounter notes, upcoming appointments, etc.  Non-urgent messages can be sent to your provider as well.   To learn more about what you can do with MyChart, go to [email or web address].    Your next appointment:   12 month(s)  The format for your next appointment:   In Person  Provider:   Chilton Si, MD or Ronn Melena NP

## 2023-06-06 NOTE — Progress Notes (Signed)
Office Visit Note   Patient: [name] [name]           Date of Birth: 07/25/1924           MRN: [id number] Visit Date: 06/06/2023              Requested by: Shelva Majestic, MD [address] [place],  [place] [address] PCP: Shelva Majestic, MD   Assessment & Plan: Visit Diagnoses:  1. Unilateral primary osteoarthritis, right knee     Plan: Impression is right knee osteoarthritis.  Today, proceed with right knee Monovisc injection.  She tolerated this well.  Follow-up as needed.  Follow-Up Instructions: Return if symptoms worsen or fail to improve.   Orders:  Orders Placed This Encounter  Procedures   Large Joint Inj   No orders of the defined types were placed in this encounter.     Procedures: Large Joint Inj: R knee on 06/06/2023 2:43 PM Indications: pain Details: 22 G needle, anterolateral approach Medications: 2 mL lidocaine 1 %; 2 mL bupivacaine 0.25 %; 88 mg Hyaluronan 88 MG/4ML      Clinical Data: No additional findings.   Subjective: Chief Complaint  Patient presents with   Right Knee - Follow-up    monovisc    HPI patient is a very pleasant 89 year old female with underlying right knee OA comes in today for Monovisc injection to the right knee.  She has had these in the past with great relief.  Symptoms have started to return about 4 to 5 weeks ago.     Objective: Vital Signs: There were no vitals taken for this visit.    Ortho Exam unchanged right knee exam  Specialty Comments:  No specialty comments available.  Imaging: No new imaging   PMFS History: Patient Active Problem List   Diagnosis Date Noted   Vertigo 05/04/2022   Mild sleep apnea 05/03/2021   Chronic diastolic heart failure (HCC) 12/30/2020   Left groin hernia 05/22/2020   Anxiety state    Rectal bleeding    Ischemic colitis (HCC)    Status post femoral-popliteal bypass surgery 12/09/2019   Persistent atrial fibrillation (HCC) 12/03/2019   Secondary  hypercoagulable state (HCC) 12/03/2019   Chronic bilateral low back pain with right-sided sciatica 08/29/2019   Gross hematuria 06/01/2018   CKD (chronic kidney disease), stage III (HCC) 08/07/2017   Unilateral primary osteoarthritis, right knee 04/07/2016   Acute gout of left foot 03/22/2016   Pain in right ankle and joints of right foot 08/28/2014   Trigeminal neuralgia    Syncope 12/29/2011   Chronic low back pain 09/13/2010   Allergic rhinitis due to pollen 06/25/2010   COPD mixed type (HCC) 01/22/2010   Solitary pulmonary nodule 01/12/2010   Insulin resistance 01/06/2009   Osteopenia 01/06/2009   Eczema 11/26/2007   Peripheral arterial disease (HCC) 06/29/2007   Hyperlipidemia 10/23/2006   Essential hypertension 10/23/2006   ESOPHAGEAL STRICTURE 08/09/1993   Past Medical History:  Diagnosis Date   Acute on chronic diastolic heart failure (HCC) 12/30/2020   Allergic rhinoconjunctivitis    Allergy    Anxiety    pt. managed- uses deep breathing    Arthritis    low back , stenosis   COLONIC POLYPS, RECURRENT 08/29/2006   2008 last colonoscopy. No further colonoscopy.      COPD (chronic obstructive pulmonary disease) (HCC)    Diverticulosis    Dysrhythmia    afib   Eczema    Environmental allergies  allergy shot- q friday in Dr. Roxy Cedar office. PFT's abnormal- recommended Spiriva to use preop & will d/c after surgery   GERD (gastroesophageal reflux disease)    Hiatal hernia    Hyperlipidemia    Hypertension    Lung nodule 2011   Paroxysmal atrial fibrillation (HCC) 11/27/2019   Peripheral vascular disease (HCC)    Shingles    Stenosis of popliteal artery (HCC)    blood clots in legs long ago     Trigeminal neuralgia    Left buttocks    Family History  Problem Relation Age of Onset   Arthritis Mother    Hypertension Mother    Heart disease Father    Colon polyps Father    Breast cancer Sister    Breast cancer Daughter    Hypertension Son    Lung cancer Brother     Colon cancer Sister    Brain cancer Sister    Lung cancer Sister    Anesthesia problems Neg Hx    Hypotension Neg Hx    Malignant hyperthermia Neg Hx    Pseudochol deficiency Neg Hx     Past Surgical History:  Procedure Laterality Date   ABDOMINAL AORTAGRAM N/A 06/17/2011   Procedure: ABDOMINAL Ronny Flurry;  Surgeon: Sherren Kerns, MD;  Location: [facility] CATH LAB;  Service: Cardiovascular;  Laterality: N/A;   ABDOMINAL AORTOGRAM W/LOWER EXTREMITY Bilateral 06/22/2018   Procedure: ABDOMINAL AORTOGRAM W/LOWER EXTREMITY;  Surgeon: Sherren Kerns, MD;  Location: [facility] INVASIVE CV LAB;  Service: Cardiovascular;  Laterality: Bilateral;   ABDOMINAL AORTOGRAM W/LOWER EXTREMITY Bilateral 11/29/2019   Procedure: ABDOMINAL AORTOGRAM W/LOWER EXTREMITY;  Surgeon: Sherren Kerns, MD;  Location: [facility] INVASIVE CV LAB;  Service: Cardiovascular;  Laterality: Bilateral;   CATARACT EXTRACTION, BILATERAL     w IOL   CHOLECYSTECTOMY  1998   COLONOSCOPY     ENDARTERECTOMY FEMORAL Left 12/09/2019   Procedure: ENDARTERECTOMY FEMORAL with bovine patch angioplasty.;  Surgeon: Sherren Kerns, MD;  Location: [facility] OR;  Service: Vascular;  Laterality: Left;   FEMORAL-POPLITEAL BYPASS GRAFT        x2 surgeries 1990's & 2009   FEMORAL-POPLITEAL BYPASS GRAFT Right 12/09/2019   Procedure: REDO RIGHT FEMORAL-POPLITEAL ARTERY BYPASS GRAFT;  Surgeon: Sherren Kerns, MD;  Location: [facility] OR;  Service: Vascular;  Laterality: Right;   INJECTION KNEE Right Aug. 2016   Gel injection for pain   LUMBAR FUSION  07/06/2011   TONSILLECTOMY     as a teenager    TUBAL LIGATION     Social History   Occupational History   Occupation: retired    Associate Professor: RETIRED  Tobacco Use   Smoking status: Former    Current packs/day: 0.00    Average packs/day: 2.0 packs/day for 30.0 years (60.0 ttl pk-yrs)    Types: Cigarettes    Start date: 06/19/1963    Quit date: 06/18/1993    Years since quitting: 29.9   Smokeless tobacco: Never    Tobacco comments:    QUIT IN 1995  Vaping Use   Vaping status: Never Used  Substance and Sexual Activity   Alcohol use: No    Alcohol/week: 0.0 standard drinks of alcohol   Drug use: No   Sexual activity: Not on file

## 2023-06-30 ENCOUNTER — Other Ambulatory Visit: Payer: Self-pay | Admitting: Family Medicine

## 2023-07-13 ENCOUNTER — Other Ambulatory Visit: Payer: Self-pay | Admitting: Allergy and Immunology

## 2023-07-18 ENCOUNTER — Other Ambulatory Visit: Payer: Self-pay

## 2023-07-18 MED ORDER — IPRATROPIUM BROMIDE 0.06 % NA SOLN: 2.0000 | Freq: Two times a day (BID) | NASAL | 3 refills | Status: AC

## 2023-07-29 ENCOUNTER — Other Ambulatory Visit: Payer: Self-pay | Admitting: Cardiovascular Disease

## 2023-07-29 DIAGNOSIS — I4819 Other persistent atrial fibrillation: Secondary | ICD-10-CM

## 2023-07-31 NOTE — Telephone Encounter (Signed)
 Prescription refill request for Eliquis received. Indication:afib Last office visit:2/25 Scr:1.28  2/25 Age: 89 Weight:76.2  kg  Prescription refilled

## 2023-09-23 ENCOUNTER — Other Ambulatory Visit: Payer: Self-pay | Admitting: Family Medicine

## 2023-10-06 ENCOUNTER — Other Ambulatory Visit: Payer: Self-pay | Admitting: Family Medicine

## 2023-12-01 ENCOUNTER — Ambulatory Visit: Payer: Self-pay | Admitting: Family Medicine

## 2023-12-01 ENCOUNTER — Other Ambulatory Visit: Payer: Self-pay | Admitting: Family Medicine

## 2023-12-01 ENCOUNTER — Encounter: Payer: Self-pay | Admitting: Family Medicine

## 2023-12-01 ENCOUNTER — Ambulatory Visit: Payer: Medicare Other | Admitting: Family Medicine

## 2023-12-01 VITALS — BP 112/68 | HR 86 | Temp 97.5°F | Ht 59.0 in | Wt 167.6 lb

## 2023-12-01 DIAGNOSIS — R7303 Prediabetes: Secondary | ICD-10-CM

## 2023-12-01 DIAGNOSIS — I4819 Other persistent atrial fibrillation: Secondary | ICD-10-CM

## 2023-12-01 DIAGNOSIS — I10 Essential (primary) hypertension: Secondary | ICD-10-CM

## 2023-12-01 DIAGNOSIS — I5032 Chronic diastolic (congestive) heart failure: Secondary | ICD-10-CM | POA: Diagnosis not present

## 2023-12-01 DIAGNOSIS — Z131 Encounter for screening for diabetes mellitus: Secondary | ICD-10-CM

## 2023-12-01 DIAGNOSIS — I739 Peripheral vascular disease, unspecified: Secondary | ICD-10-CM | POA: Diagnosis not present

## 2023-12-01 DIAGNOSIS — E7800 Pure hypercholesterolemia, unspecified: Secondary | ICD-10-CM | POA: Diagnosis not present

## 2023-12-01 LAB — COMPREHENSIVE METABOLIC PANEL WITH GFR
ALT: 9 U/L (ref 0–35)
AST: 15 U/L (ref 0–37)
Albumin: 4.3 g/dL (ref 3.5–5.2)
Alkaline Phosphatase: 64 U/L (ref 39–117)
BUN: 33 mg/dL — ABNORMAL HIGH (ref 6–23)
CO2: 29 meq/L (ref 19–32)
Calcium: 9.4 mg/dL (ref 8.4–10.5)
Chloride: 101 meq/L (ref 96–112)
Creatinine, Ser: 1.4 mg/dL — ABNORMAL HIGH (ref 0.40–1.20)
GFR: 31.63 mL/min — ABNORMAL LOW (ref >60.00)
Glucose, Bld: 116 mg/dL — ABNORMAL HIGH (ref 70–99)
Potassium: 4 meq/L (ref 3.5–5.1)
Sodium: 141 meq/L (ref 135–145)
Total Bilirubin: 0.6 mg/dL (ref 0.2–1.2)
Total Protein: 6.4 g/dL (ref 6.0–8.3)

## 2023-12-01 LAB — CBC WITH DIFFERENTIAL/PLATELET
Basophils Absolute: 0.1 K/uL (ref 0.0–0.1)
Basophils Relative: 0.7 % (ref 0.0–3.0)
Eosinophils Absolute: 0.2 K/uL (ref 0.0–0.7)
Eosinophils Relative: 1.6 % (ref 0.0–5.0)
HCT: 39 % (ref 36.0–46.0)
Hemoglobin: 13.1 g/dL (ref 12.0–15.0)
Lymphocytes Relative: 24.8 % (ref 12.0–46.0)
Lymphs Abs: 2.6 K/uL (ref 0.7–4.0)
MCHC: 33.5 g/dL (ref 30.0–36.0)
MCV: 85.5 fl (ref 78.0–100.0)
Monocytes Absolute: 0.6 K/uL (ref 0.1–1.0)
Monocytes Relative: 5.6 % (ref 3.0–12.0)
Neutro Abs: 7 K/uL (ref 1.4–7.7)
Neutrophils Relative %: 67.3 % (ref 43.0–77.0)
Platelets: 167 K/uL (ref 150.0–400.0)
RBC: 4.56 Mil/uL (ref 3.87–5.11)
RDW: 15.1 % (ref 11.5–15.5)
WBC: 10.5 K/uL (ref 4.0–10.5)

## 2023-12-01 LAB — HEMOGLOBIN A1C: Hgb A1c MFr Bld: 6.6 % — ABNORMAL HIGH (ref 4.6–6.5)

## 2023-12-01 NOTE — Patient Instructions (Addendum)
 Heart failure doing well-continue current medication including daily Lasix - but she can skip on days where she is going out of the home as bothered by urinary frequency and if really bothering her still could try every other day but needs to watch weight daily to make sure not going up and swelling daily as well . Weight going up 3 lbs in a day or 5 lbs in 3 days would be very concerning -could also alternate 40 mg and 20 mg on alternate days  Please stop by lab before you go If you have mychart- we will send your results within 3 business days of us  receiving them.  If you do not have mychart- we will call you about results within 5 business days of us  receiving them.  *please also note that you will see labs on mychart as soon as they post. I will later go in and write notes on them- will say notes from Dr. Katrinka   Recommended follow up: Return in about 6 months (around 06/02/2024) for followup or sooner if needed.Schedule b4 you leave.

## 2023-12-01 NOTE — Progress Notes (Addendum)
 Phone [phone] In person visit   Subjective:   [name] [name] is a 89 y.o. year old very pleasant female patient who presents for/with See problem oriented charting Chief Complaint  Patient presents with   Medical Management of Chronic Issues   Hypertension   Hyperlipidemia   Back Pain    Pt c/o back pain that is getting worse   Past Medical History-  Patient Active Problem List   Diagnosis Date Noted   Chronic diastolic heart failure (HCC) 12/30/2020    Priority: High   Status post femoral-popliteal bypass surgery 12/09/2019    Priority: High   Persistent atrial fibrillation (HCC) 12/03/2019    Priority: High   Pain in right ankle and joints of right foot 08/28/2014    Priority: High   Peripheral arterial disease (HCC) 06/29/2007    Priority: High   Vertigo 05/04/2022    Priority: Medium    Left groin hernia 05/22/2020    Priority: Medium    CKD (chronic kidney disease), stage III (HCC) 08/07/2017    Priority: Medium    Acute gout of left foot 03/22/2016    Priority: Medium    Trigeminal neuralgia     Priority: Medium    Chronic low back pain 09/13/2010    Priority: Medium    COPD mixed type (HCC) 01/22/2010    Priority: Medium    Insulin resistance 01/06/2009    Priority: Medium    Hyperlipidemia 10/23/2006    Priority: Medium    Essential hypertension 10/23/2006    Priority: Medium    ESOPHAGEAL STRICTURE 08/09/1993    Priority: Medium    Secondary hypercoagulable state (HCC) 12/03/2019    Priority: Low   Syncope 12/29/2011    Priority: Low   Allergic rhinitis due to pollen 06/25/2010    Priority: Low   Solitary pulmonary nodule 01/12/2010    Priority: Low   Osteopenia 01/06/2009    Priority: Low   Eczema 11/26/2007    Priority: Low   Chronic bilateral low back pain with right-sided sciatica 08/29/2019    Priority: 1.   Unilateral primary osteoarthritis, right knee 04/07/2016    Priority: 1.   Mild sleep apnea 05/03/2021   Anxiety state     Rectal bleeding    Ischemic colitis (HCC)    Gross hematuria 06/01/2018    Medications- reviewed and updated Current Outpatient Medications  Medication Sig Dispense Refill   acetaminophen  (TYLENOL ) 500 MG tablet Take 2 tablets (1,000 mg total) by mouth 2 (two) times daily. 30 tablet 0   apixaban  (ELIQUIS ) 5 MG TABS tablet Take 1 tablet by mouth twice daily 180 tablet 1   atorvastatin  (LIPITOR) 40 MG tablet Take 1 tablet by mouth once daily 90 tablet 0   diclofenac  Sodium (VOLTAREN ) 1 % GEL Apply 2 g topically 4 (four) times daily.     famotidine  (PEPCID ) 20 MG tablet Take 20mg  in the evening for 1 week. Then take as needed for indigestion. 30 tablet 2   furosemide  (LASIX ) 40 MG tablet Take 1 tablet by mouth once daily 90 tablet 3   ipratropium (ATROVENT ) 0.06 % nasal spray Place 2 sprays into both nostrils 2 (two) times daily. 15 mL 3   lidocaine  (LIDODERM ) 5 % Place 1 patch onto the skin daily. Remove & Discard patch within 12 hours or as directed by MD 90 patch 3   lisinopril  (ZESTRIL ) 5 MG tablet Take 1 tablet by mouth once daily 90 tablet 3   meclizine  (ANTIVERT ) 25  MG tablet TAKE 1/2 (ONE-HALF) TABLET BY MOUTH THREE TIMES DAILY AS NEEDED FOR VERTIGO 30 tablet 0   melatonin 3 MG TABS tablet Take 2 tablets (6 mg total) by mouth at bedtime as needed (sleep).  0   metoprolol  succinate (TOPROL -XL) 100 MG 24 hr tablet TAKE 1 TABLET BY MOUTH ONCE DAILY WITH OR IMMEDIATELY FOLLOWING A MEAL 90 tablet 3   Multiple Vitamin (MULTIVITAMIN WITH MINERALS) TABS tablet Take 1 tablet by mouth daily.     pantoprazole  (PROTONIX ) 40 MG tablet TAKE 1 TABLET BY MOUTH TWICE DAILY AS DIRECTED 180 tablet 3   RESTASIS 0.05 % ophthalmic emulsion Place 1 drop into both eyes 2 (two) times daily as needed (dry eyes).   99   senna-docusate (SENOKOT-S) 8.6-50 MG tablet Take 1 tablet by mouth at bedtime as needed for mild constipation.     No current facility-administered medications for this visit.     Objective:   BP 112/68   Pulse 86   Temp (!) 97.5 F (36.4 C)   Ht 4' 11 (1.499 m)   Wt 167 lb 9.6 oz (76 kg)   SpO2 96%   BMI 33.85 kg/m  Gen: NAD, resting comfortably CV: irregularly irregular  no murmurs rubs or gallops Lungs: CTAB no crackles, wheeze, rhonchi Ext: trace edema Skin: warm, dry Neuro: walks with walker    Assessment and Plan   # Chronic back pain S: for chronic back pain-has worked with PM&R-was getting shots in her back that are working better than cymbalta  was prior to that.  At most recent visits was reporting capsaicin /chilli packs most helpful- reports q3 month(s).  Working with Dr. Atha 2024-last visit was 08/11/2022 - she reports has been worsening and living/tolerating it- 2 tylenol  eases for first 3 hours of day- takes 2 before leaving room. Does pain cream.  - capsaicin /chilli packs not helping much anymore- creams more helpful  A/P: ongoing pain and not a lot of great options left available - wants to hold off on return to PM&R continue cream and tylenol   #PAD-actually on warfarin for arterial thrombosis in the past now on Eliquis  for A. fib as well. Dr. Harvey (now Dr. Gretta last seen novemebr 2024) ok with compression stockings #Hyperlipidemia  S: Compliant with atorvastatin  40 mg.  LDL goal 70 or less due to arterial disease. -foot pedaling and knee lifts from seated position help some.  Lab Results  Component Value Date   CHOL 119 05/31/2023   HDL 59.80 05/31/2023   LDLCALC 37 05/31/2023   LDLDIRECT 46.0 06/10/2019   TRIG 111.0 05/31/2023   CHOLHDL 2 05/31/2023  A/P: Activity is limited but no obvious claudication.  Lipids very well-controlled with LDL under 70-continue current medication.  Remain on Eliquis  as well for history of arterial thrombosis - keep up activity as tolerated  # Atrial fibrillation S: Rate controlled with metoprolol  100 mg extended release Anticoagulated with Eliquis  5 mg twice daily- reevaluate dose if cr raises above 1.5 but  most recently at 1.28 A/P:  appropriately anticoagulated and rate controlled- continue current medicine  #Diastolic CHF- sees dr. raford #Essential hypertension S: medication: Lisinopril  5 mg, metoprolol  100 mg XR and lasix  40 mg daily prn-in 2024 taking daily  -Entresto  caused fatigue  Edema: No increase Weight gain: No increase A/P: Blood pressure well-controlled-continue current medication  CHF appears euvolemic-continue current medication including daily Lasix - but she can skip on days where she is going out of the home as bothered by urinary frequency  and if really bothering her still could try every other day but needs to watch weight daily to make sure not going up and swelling daily as well   #Chronic kidney disease stage III S: Patient's GFR has been stable in the 30s.  She knows to avoid NSAIDs A/P:Thankfully stable-update level today  #Insulin resistance/obesity/prediabetes- peak a1c 6.2 in 2021 S: A1c was trending up slightly at last visit-encouraged dietary improvement and activity as tolerated. Did have good birthday with amazing cake and worried may be up some Lab Results  Component Value Date   HGBA1C 6.3 05/31/2023   HGBA1C 6.0 (H) 05/05/2022   HGBA1C 6.1 08/20/2020  A/P: prediabetes noted- update a1c   Recommended follow up: Return in about 6 months (around 06/02/2024) for followup or sooner if needed.Schedule b4 you leave. Future Appointments  Date Time Provider Department Center  02/15/2024 11:20 AM [facility] ANNUAL WELLNESS VISIT 1 [facility]   Lab/Order associations:   ICD-10-CM   1. Essential hypertension  I10     2. Pure hypercholesterolemia  E78.00     3. Chronic diastolic heart failure (HCC)  P49.67     4. Peripheral arterial disease (HCC)  I73.9     5. Persistent atrial fibrillation (HCC)  I48.19     6. Screening for diabetes mellitus  Z13.1     7. Prediabetes  R73.03       No orders of the defined types were placed in this  encounter.   Return precautions advised.  Garnette Lukes, MD

## 2023-12-23 ENCOUNTER — Other Ambulatory Visit: Payer: Self-pay | Admitting: Family Medicine

## 2023-12-29 ENCOUNTER — Telehealth: Payer: Self-pay | Admitting: Family Medicine

## 2023-12-29 NOTE — Telephone Encounter (Signed)
 Please see patient message and advise.    Copied from CRM [id number]. Topic: Clinical - Medical Advice >> Dec 29, 2023 12:25 PM [name] [name] wrote: Pt would like to know if Dr. Katrinka, Garnette recommends that she takes the Covid and flu vaccine

## 2023-12-29 NOTE — Telephone Encounter (Signed)
 Spoke with patient. She is aware it is okay to proceed with vaccines. No further questions at this time.

## 2023-12-29 NOTE — Telephone Encounter (Signed)
 Yes I would as long as she has tolerated these previously

## 2024-01-17 DIAGNOSIS — Z23 Encounter for immunization: Secondary | ICD-10-CM | POA: Diagnosis not present

## 2024-01-27 ENCOUNTER — Other Ambulatory Visit: Payer: Self-pay | Admitting: Cardiovascular Disease

## 2024-01-27 DIAGNOSIS — I4819 Other persistent atrial fibrillation: Secondary | ICD-10-CM

## 2024-01-29 NOTE — Telephone Encounter (Signed)
 Prescription refill request for Eliquis  received. Indication:afib Last office visit:2/25 Scr:1.40  8/25 Age: 89 Weight:76  kg  Prescription refilled

## 2024-02-03 ENCOUNTER — Other Ambulatory Visit: Payer: Self-pay | Admitting: Family

## 2024-02-05 ENCOUNTER — Telehealth: Payer: Self-pay | Admitting: Orthopaedic Surgery

## 2024-02-05 NOTE — Telephone Encounter (Signed)
 Patient called. She would like a gel injection in her knee.

## 2024-02-08 ENCOUNTER — Ambulatory Visit: Admitting: Physician Assistant

## 2024-02-08 DIAGNOSIS — Z23 Encounter for immunization: Secondary | ICD-10-CM | POA: Diagnosis not present

## 2024-02-08 NOTE — Telephone Encounter (Signed)
 Noted

## 2024-02-15 ENCOUNTER — Other Ambulatory Visit: Payer: Self-pay

## 2024-02-15 ENCOUNTER — Ambulatory Visit: Payer: Medicare Other

## 2024-02-15 VITALS — BP 118/68 | HR 94 | Temp 97.3°F | Ht <= 58 in | Wt 166.8 lb

## 2024-02-15 DIAGNOSIS — M1711 Unilateral primary osteoarthritis, right knee: Secondary | ICD-10-CM

## 2024-02-15 DIAGNOSIS — Z0000 Encounter for general adult medical examination without abnormal findings: Secondary | ICD-10-CM

## 2024-02-15 NOTE — Progress Notes (Signed)
 Subjective:   [name] [name] is a 89 y.o. who presents for a Medicare Wellness preventive visit.  As a reminder, Annual Wellness Visits don't include a physical exam, and some assessments may be limited, especially if this visit is performed virtually. We may recommend an in-person follow-up visit with your provider if needed.  Visit Complete: In person    Persons Participating in Visit: Patient assisted by son Glendia.  AWV Questionnaire: No: Patient Medicare AWV questionnaire was not completed prior to this visit.  Cardiac Risk Factors include: advanced age (>19men, >9 women);dyslipidemia;hypertension;obesity (BMI >30kg/m2)     Objective:    Today's Vitals   02/15/24 1131  BP: 118/68  Pulse: 94  Temp: (!) 97.3 F (36.3 C)  SpO2: 94%  Weight: 166 lb 12.8 oz (75.7 kg)  Height: 4' 10 (1.473 m)   Body mass index is 34.86 kg/m.     02/15/2024   11:50 AM 02/09/2023   11:49 AM 09/22/2022    9:36 AM 05/04/2022   11:36 AM 02/03/2022    1:05 PM 11/02/2021   10:17 AM 05/18/2021    1:50 PM  Advanced Directives  Does Patient Have a Medical Advance Directive? Yes Yes Yes No Yes Yes Yes  Type of Estate agent of [place];Living will Healthcare Power of [place];Living will   Healthcare Power of [place];Living will Healthcare Power of [place];Living will Healthcare Power of [place];Living will;Out of facility DNR (pink MOST or yellow form)  Does patient want to make changes to medical advance directive?     No - Patient declined    Copy of Healthcare Power of Attorney in Chart? No - copy requested No - copy requested   Yes - validated most recent copy scanned in chart (See row information)    Would patient like information on creating a medical advance directive?    No - Patient declined       Current Medications (verified) Outpatient Encounter Medications as of 02/15/2024  Medication Sig   acetaminophen  (TYLENOL ) 500 MG tablet Take 2 tablets (1,000 mg  total) by mouth 2 (two) times daily.   apixaban  (ELIQUIS ) 5 MG TABS tablet Take 1 tablet by mouth twice daily   atorvastatin  (LIPITOR) 40 MG tablet Take 1 tablet by mouth once daily   diclofenac  Sodium (VOLTAREN ) 1 % GEL Apply 2 g topically 4 (four) times daily.   famotidine  (PEPCID ) 20 MG tablet Take 20mg  in the evening for 1 week. Then take as needed for indigestion.   furosemide  (LASIX ) 40 MG tablet Take 1 tablet by mouth once daily   ipratropium (ATROVENT ) 0.06 % nasal spray Place 2 sprays into both nostrils 2 (two) times daily.   lidocaine  (LIDODERM ) 5 % Place 1 patch onto the skin daily. Remove & Discard patch within 12 hours or as directed by MD   lisinopril  (ZESTRIL ) 5 MG tablet Take 1 tablet by mouth once daily   meclizine  (ANTIVERT ) 25 MG tablet TAKE 1/2 (ONE-HALF) TABLET BY MOUTH THREE TIMES DAILY AS NEEDED FOR VERTIGO   melatonin 3 MG TABS tablet Take 2 tablets (6 mg total) by mouth at bedtime as needed (sleep).   metoprolol  succinate (TOPROL -XL) 100 MG 24 hr tablet TAKE 1 TABLET BY MOUTH ONCE DAILY WITH OR IMMEDIATELY FOLLOWING A MEAL   Multiple Vitamin (MULTIVITAMIN WITH MINERALS) TABS tablet Take 1 tablet by mouth daily.   pantoprazole  (PROTONIX ) 40 MG tablet TAKE 1 TABLET BY MOUTH TWICE DAILY AS DIRECTED   RESTASIS 0.05 % ophthalmic emulsion  Place 1 drop into both eyes 2 (two) times daily as needed (dry eyes).    senna-docusate (SENOKOT-S) 8.6-50 MG tablet Take 1 tablet by mouth at bedtime as needed for mild constipation.   No facility-administered encounter medications on file as of 02/15/2024.    Allergies (verified) Sulfa antibiotics, Tiotropium bromide , Latex, Ultram  [tramadol ], and 5-alpha reductase inhibitors   History: Past Medical History:  Diagnosis Date   Acute on chronic diastolic heart failure (HCC) 12/30/2020   Allergic rhinoconjunctivitis    Allergy     Anxiety    pt. managed- uses deep breathing    Arthritis    low back , stenosis   COLONIC POLYPS,  RECURRENT 08/29/2006   2008 last colonoscopy. No further colonoscopy.      COPD (chronic obstructive pulmonary disease) (HCC)    Diverticulosis    Dysrhythmia    afib   Eczema    Environmental allergies    allergy  shot- q friday in Dr. Saundra office. PFT's abnormal- recommended Spiriva  to use preop & will d/c after surgery   GERD (gastroesophageal reflux disease)    Hiatal hernia    Hyperlipidemia    Hypertension    Lung nodule 2011   Paroxysmal atrial fibrillation (HCC) 11/27/2019   Peripheral vascular disease    Shingles    Stenosis of popliteal artery    blood clots in legs long ago     Trigeminal neuralgia    Left buttocks   Past Surgical History:  Procedure Laterality Date   ABDOMINAL AORTAGRAM N/A 06/17/2011   Procedure: ABDOMINAL EZELLA;  Surgeon: Carlin FORBES Haddock, MD;  Location: [facility] CATH LAB;  Service: Cardiovascular;  Laterality: N/A;   ABDOMINAL AORTOGRAM W/LOWER EXTREMITY Bilateral 06/22/2018   Procedure: ABDOMINAL AORTOGRAM W/LOWER EXTREMITY;  Surgeon: Haddock Carlin FORBES, MD;  Location: [facility] INVASIVE CV LAB;  Service: Cardiovascular;  Laterality: Bilateral;   ABDOMINAL AORTOGRAM W/LOWER EXTREMITY Bilateral 11/29/2019   Procedure: ABDOMINAL AORTOGRAM W/LOWER EXTREMITY;  Surgeon: Haddock Carlin FORBES, MD;  Location: [facility] INVASIVE CV LAB;  Service: Cardiovascular;  Laterality: Bilateral;   CATARACT EXTRACTION, BILATERAL     w IOL   CHOLECYSTECTOMY  1998   COLONOSCOPY     ENDARTERECTOMY FEMORAL Left 12/09/2019   Procedure: ENDARTERECTOMY FEMORAL with bovine patch angioplasty.;  Surgeon: Haddock Carlin FORBES, MD;  Location: [facility] OR;  Service: Vascular;  Laterality: Left;   FEMORAL-POPLITEAL BYPASS GRAFT        x2 surgeries 1990's & 2009   FEMORAL-POPLITEAL BYPASS GRAFT Right 12/09/2019   Procedure: REDO RIGHT FEMORAL-POPLITEAL ARTERY BYPASS GRAFT;  Surgeon: Haddock Carlin FORBES, MD;  Location: [facility] OR;  Service: Vascular;  Laterality: Right;   INJECTION KNEE Right Aug. 2016   Gel injection for  pain   LUMBAR FUSION  07/06/2011   TONSILLECTOMY     as a teenager    TUBAL LIGATION     Family History  Problem Relation Age of Onset   Arthritis Mother    Hypertension Mother    Heart disease Father    Colon polyps Father    Breast cancer Sister    Breast cancer Daughter    Hypertension Son    Lung cancer Brother    Colon cancer Sister    Brain cancer Sister    Lung cancer Sister    Anesthesia problems Neg Hx    Hypotension Neg Hx    Malignant hyperthermia Neg Hx    Pseudochol deficiency Neg Hx    Social History   Socioeconomic History   Marital  status: Widowed    Spouse name: Not on file   Number of children: 4   Years of education: 94   Highest education level: Not on file  Occupational History   Occupation: retired    Associate Professor: RETIRED  Tobacco Use   Smoking status: Former    Current packs/day: 0.00    Average packs/day: 2.0 packs/day for 30.0 years (60.0 ttl pk-yrs)    Types: Cigarettes    Start date: 06/19/1963    Quit date: 06/18/1993    Years since quitting: 30.6   Smokeless tobacco: Never   Tobacco comments:    QUIT IN 1995  Vaping Use   Vaping status: Never Used  Substance and Sexual Activity   Alcohol use: No    Alcohol/week: 0.0 standard drinks of alcohol   Drug use: No   Sexual activity: Not on file  Other Topics Concern   Not on file  Social History Narrative   Patient is widowed with 4 children. 2 grandkids. Lives alone.    Patient is right handed.   Patient has high school education.   Patient drinks 1 cup daily.      Retired from [employer] for 28 years, works 2 days a week until 2016.       Hobbies: volunteers at [employer], Garnette Silver from church visits people.       No caffeine.    Social Drivers of Corporate investment banker Strain: Low Risk  (02/15/2024)   Overall Financial Resource Strain (CARDIA)    Difficulty of Paying Living Expenses: Not hard at all  Food Insecurity: No Food Insecurity (02/15/2024)   Hunger  Vital Sign    Worried About Running Out of Food in the Last Year: Never true    Ran Out of Food in the Last Year: Never true  Transportation Needs: No Transportation Needs (02/15/2024)   PRAPARE - Administrator, Civil Service (Medical): No    Lack of Transportation (Non-Medical): No  Physical Activity: Sufficiently Active (02/15/2024)   Exercise Vital Sign    Days of Exercise per Week: 7 days    Minutes of Exercise per Session: 30 min  Stress: No Stress Concern Present (02/15/2024)   [employer] of Occupational Health - Occupational Stress Questionnaire    Feeling of Stress: Not at all  Social Connections: Moderately Integrated (02/15/2024)   Social Connection and Isolation Panel    Frequency of Communication with Friends and Family: More than three times a week    Frequency of Social Gatherings with Friends and Family: More than three times a week    Attends Religious Services: More than 4 times per year    Active Member of [employer] or Organizations: Yes    Attends Banker Meetings: 1 to 4 times per year    Marital Status: Widowed    Tobacco Counseling Counseling given: Not Answered Tobacco comments: QUIT IN 1995    Clinical Intake:  Pre-visit preparation completed: Yes  Pain : No/denies pain     BMI - recorded: 34.86 Nutritional Status: BMI > 30  Obese Nutritional Risks: None Diabetes: No  Lab Results  Component Value Date   HGBA1C 6.6 (H) 12/01/2023   HGBA1C 6.3 05/31/2023   HGBA1C 6.0 (H) 05/05/2022     How often do you need to have someone help you when you read instructions, pamphlets, or other written materials from your doctor or pharmacy?: 1 - Never  Interpreter Needed?: No  Information entered by ::  Ellouise Haws, LPN   Activities of Daily Living     02/15/2024   11:43 AM  In your present state of health, do you have any difficulty performing the following activities:  Hearing? 1  Comment has hearing aids  Vision?  0  Difficulty concentrating or making decisions? 0  Walking or climbing stairs? 1  Comment uses walker  Dressing or bathing? 1  Comment daughters assist  Doing errands, shopping? 0  Preparing Food and eating ? Y  Comment scott cooks  Using the Toilet? N  In the past six months, have you accidently leaked urine? N  Do you have problems with loss of bowel control? N  Managing your Medications? N  Managing your Finances? N  Housekeeping or managing your Housekeeping? N    Patient Care Team: Katrinka Garnette KIDD, MD as PCP - General (Family Medicine) Raford Riggs, MD as PCP - Cardiology (Cardiology) Jerri Kay HERO, MD as Attending Physician (Orthopedic Surgery) Tobie Burgess [place], MD as Consulting Physician (Physical Medicine and Rehabilitation) Octavia Bruckner, MD as Consulting Physician (Ophthalmology) Skeet Juliene SAUNDERS, DO as Consulting Physician (Neurology) Nicholaus Sherlean CROME, [facility] (Inactive) as Pharmacist (Pharmacist)  I have updated your Care Teams any recent Medical Services you may have received from other providers in the past year.     Assessment:   This is a routine wellness examination for Raylynne.  Hearing/Vision screen Hearing Screening - Comments:: Pt has hearing aids don't wear  Vision Screening - Comments:: Wears rx glasses - up to date with routine eye exams with DR Octavia    Goals Addressed             This Visit's Progress    Patient Stated       Maintain health and activity        Depression Screen     02/15/2024   11:45 AM 02/09/2023   11:46 AM 06/30/2022   11:06 AM 04/26/2022   10:59 AM 02/22/2022   11:13 AM 02/03/2022    1:03 PM 12/28/2021   11:03 AM  PHQ 2/9 Scores  PHQ - 2 Score 0 0 0 0 0 0 0    Fall Risk     02/15/2024   11:49 AM 02/09/2023   11:49 AM 06/30/2022   11:06 AM 04/26/2022   10:59 AM 02/22/2022   11:13 AM  Fall Risk   Falls in the past year? 0 0 0 0 0  Number falls in past yr: 0 0 0 0   Injury with Fall? 0 0 0 0   Risk for  fall due to : Impaired balance/gait;Impaired mobility Impaired vision     Follow up Falls prevention discussed Falls prevention discussed       MEDICARE RISK AT HOME:  Medicare Risk at Home Any stairs in or around the home?: No If so, are there any without handrails?: No Home free of loose throw rugs in walkways, pet beds, electrical cords, etc?: Yes Life alert?: Yes Use of a cane, walker or w/c?: Yes Grab bars in the bathroom?: Yes Shower chair or bench in shower?: Yes Elevated toilet seat or a handicapped toilet?: Yes  TIMED UP AND GO:  Was the test performed?  Yes  Length of time to ambulate 10 feet: 20 sec Gait slow and steady with assistive device  Cognitive Function: 6CIT completed        02/15/2024   11:50 AM 02/09/2023   11:50 AM 02/03/2022    1:10 PM 01/21/2021  11:10 AM 01/16/2020   10:32 AM  6CIT Screen  What Year? 0 points 0 points 0 points 0 points 0 points  What month? 0 points 0 points 0 points 0 points 0 points  What time? 0 points 0 points 0 points 0 points   Count back from 20 0 points 0 points 0 points 0 points 0 points  Months in reverse 0 points 0 points 0 points 0 points 0 points  Repeat phrase 0 points 0 points 0 points 2 points 4 points  Total Score 0 points 0 points 0 points 2 points     Immunizations Immunization History  Administered Date(s) Administered   Fluad Quad(high Dose 65+) 01/14/2019, 02/12/2020, 01/21/2021   Fluad Trivalent(High Dose 65+) 02/09/2023   Influenza Split 12/25/2011   Influenza Whole 01/23/2010   Influenza,inj,Quad PF,6+ Mos 12/31/2012, 12/26/2014, 01/08/2016   Influenza-Unspecified 01/23/2014, 12/25/2017, 12/28/2021   PFIZER Comirnaty(Gray Top)Covid-19 Tri-Sucrose Vaccine 02/18/2022   PFIZER(Purple Top)SARS-COV-2 Vaccination 05/14/2019, 06/03/2019, 01/22/2020, 08/05/2020   PPD Test 06/24/2011   Pneumococcal Conjugate-13 08/31/2015   Pneumococcal Polysaccharide-23 04/25/2005   Td 04/25/2005   Tdap 01/14/2019    Zoster Recombinant(Shingrix) 04/20/2017, 06/29/2017   Zoster, Live 03/02/2010    Screening Tests Health Maintenance  Topic Date Due   COVID-19 Vaccine (6 - 2025-26 season) 12/25/2023   Influenza Vaccine  07/23/2024 (Originally 11/24/2023)   Medicare Annual Wellness (AWV)  02/14/2025   DTaP/Tdap/Td (3 - Td or Tdap) 01/13/2029   Pneumococcal Vaccine: 50+ Years  Completed   DEXA SCAN  Completed   Zoster Vaccines- Shingrix  Completed   Meningococcal B Vaccine  Aged Out    Health Maintenance Items Addressed: See Nurse Notes at the end of this note  Additional Screening:  Vision Screening: Recommended annual ophthalmology exams for early detection of glaucoma and other disorders of the eye. Is the patient up to date with their annual eye exam?  Yes  Who is the provider or what is the name of the office in which the patient attends annual eye exams? Dr Octavia   Dental Screening: Recommended annual dental exams for proper oral hygiene  Community Resource Referral / Chronic Care Management: CRR required this visit?  No   CCM required this visit?  No   Plan:    I have personally reviewed and noted the following in the patient's chart:   Medical and social history Use of alcohol, tobacco or illicit drugs  Current medications and supplements including opioid prescriptions. Patient is not currently taking opioid prescriptions. Functional ability and status Nutritional status Physical activity Advanced directives List of other physicians Hospitalizations, surgeries, and ER visits in previous 12 months Vitals Screenings to include cognitive, depression, and falls Referrals and appointments  In addition, I have reviewed and discussed with patient certain preventive protocols, quality metrics, and best practice recommendations. A written personalized care plan for preventive services as well as general preventive health recommendations were provided to patient.   Ellouise VEAR Haws,  LPN   [id number]   After Visit Summary: (In Person-Printed) AVS printed and given to the patient  Notes: Nothing significant to report at this time.

## 2024-02-15 NOTE — Patient Instructions (Signed)
 Ms. Smolinski,  Thank you for taking the time for your Medicare Wellness Visit. I appreciate your continued commitment to your health goals. Please review the care plan we discussed, and feel free to reach out if I can assist you further.  Medicare recommends these wellness visits once per year to help you and your care team stay ahead of potential health issues. These visits are designed to focus on prevention, allowing your provider to concentrate on managing your acute and chronic conditions during your regular appointments.  Please note that Annual Wellness Visits do not include a physical exam. Some assessments may be limited, especially if the visit was conducted virtually. If needed, we may recommend a separate in-person follow-up with your provider.  Ongoing Care Seeing your primary care provider every 3 to 6 months helps us  monitor your health and provide consistent, personalized care.   Referrals If a referral was made during today's visit and you haven't received any updates within two weeks, please contact the referred provider directly to check on the status.  Recommended Screenings:  Health Maintenance  Topic Date Due   COVID-19 Vaccine (6 - 2025-26 season) 12/25/2023   Medicare Annual Wellness Visit  02/09/2024   Flu Shot  07/23/2024*   DTaP/Tdap/Td vaccine (3 - Td or Tdap) 01/13/2029   Pneumococcal Vaccine for age over 72  Completed   DEXA scan (bone density measurement)  Completed   Zoster (Shingles) Vaccine  Completed   Meningitis B Vaccine  Aged Out  *Topic was postponed. The date shown is not the original due date.       02/09/2023   11:49 AM  Advanced Directives  Does Patient Have a Medical Advance Directive? Yes  Type of Estate agent of [place];Living will  Copy of Healthcare Power of Attorney in Chart? No - copy requested   Advance Care Planning is important because it: Ensures you receive medical care that aligns with your values,  goals, and preferences. Provides guidance to your family and loved ones, reducing the emotional burden of decision-making during critical moments.  Vision: Annual vision screenings are recommended for early detection of glaucoma, cataracts, and diabetic retinopathy. These exams can also reveal signs of chronic conditions such as diabetes and high blood pressure.  Dental: Annual dental screenings help detect early signs of oral cancer, gum disease, and other conditions linked to overall health, including heart disease and diabetes.  Please see the attached documents for additional preventive care recommendations.

## 2024-02-15 NOTE — Telephone Encounter (Signed)
VOB has been submitted  

## 2024-02-24 ENCOUNTER — Other Ambulatory Visit: Payer: Self-pay | Admitting: Family Medicine

## 2024-02-26 ENCOUNTER — Encounter: Payer: Self-pay | Admitting: Radiology

## 2024-02-26 ENCOUNTER — Telehealth: Payer: Self-pay | Admitting: Family Medicine

## 2024-02-26 NOTE — Telephone Encounter (Signed)
 Pt's daughter called with an update on this. She states that the only thing that seems to help during these episodes is the PT . She was just hoping to get this moved along. RN stated understanding and advised would send the message to clinic staff.

## 2024-02-26 NOTE — Telephone Encounter (Signed)
 Yes thanks you may refer to physical therapy/vestibular therapy with office they are requesting-please clarify [facility] neurology though-I do not know exactly what they mean

## 2024-02-26 NOTE — Telephone Encounter (Signed)
 Okay to send referral?    Copied from CRM [id number]. Topic: Clinical - Medication Question >> Feb 26, 2024  8:51 AM Anairis L wrote: Reason for CRM: [name] [name] is calling because she believe her mother is having another vertigo episode and is requesting pt for the vertigo, at [facility] neurology.   Please call [phone]

## 2024-02-27 ENCOUNTER — Other Ambulatory Visit: Payer: Self-pay | Admitting: Family Medicine

## 2024-02-27 DIAGNOSIS — R42 Dizziness and giddiness: Secondary | ICD-10-CM

## 2024-02-27 NOTE — Telephone Encounter (Signed)
 Spoke with daughter. Referral placed for vestibular therapy.

## 2024-02-28 ENCOUNTER — Ambulatory Visit: Admitting: Physician Assistant

## 2024-02-28 DIAGNOSIS — M1711 Unilateral primary osteoarthritis, right knee: Secondary | ICD-10-CM

## 2024-02-28 MED ORDER — LIDOCAINE HCL 1 % IJ SOLN
2.0000 mL | INTRAMUSCULAR | Status: AC | PRN
  Administered 2024-02-28: 2 mL

## 2024-02-28 MED ORDER — BUPIVACAINE HCL 0.25 % IJ SOLN
2.0000 mL | INTRAMUSCULAR | Status: AC | PRN
  Administered 2024-02-28: 2 mL via INTRA_ARTICULAR

## 2024-02-28 MED ORDER — HYALURONAN 88 MG/4ML IX SOSY
88.0000 mg | PREFILLED_SYRINGE | INTRA_ARTICULAR | Status: AC | PRN
  Administered 2024-02-28: 88 mg via INTRA_ARTICULAR

## 2024-02-28 NOTE — Progress Notes (Signed)
   Procedure Note  Patient: [name] [name]             Date of Birth: 07-22-1924           MRN: [id number]             Visit Date: 02/28/2024  Procedures: Visit Diagnoses:  1. Unilateral primary osteoarthritis, right knee     Large Joint Inj: R knee on 02/28/2024 10:00 AM Indications: pain Details: 22 G needle, anterolateral approach Medications: 2 mL lidocaine  1 %; 2 mL bupivacaine  0.25 %; 88 mg Hyaluronan 88 MG/4ML

## 2024-03-05 DIAGNOSIS — R42 Dizziness and giddiness: Secondary | ICD-10-CM | POA: Diagnosis not present

## 2024-03-05 DIAGNOSIS — H8113 Benign paroxysmal vertigo, bilateral: Secondary | ICD-10-CM | POA: Diagnosis not present

## 2024-03-20 DIAGNOSIS — H8112 Benign paroxysmal vertigo, left ear: Secondary | ICD-10-CM | POA: Diagnosis not present

## 2024-03-20 DIAGNOSIS — R42 Dizziness and giddiness: Secondary | ICD-10-CM | POA: Diagnosis not present

## 2024-03-23 ENCOUNTER — Other Ambulatory Visit: Payer: Self-pay | Admitting: Family Medicine

## 2024-03-28 DIAGNOSIS — R42 Dizziness and giddiness: Secondary | ICD-10-CM | POA: Diagnosis not present

## 2024-03-28 DIAGNOSIS — H8112 Benign paroxysmal vertigo, left ear: Secondary | ICD-10-CM | POA: Diagnosis not present

## 2024-04-13 ENCOUNTER — Other Ambulatory Visit: Payer: Self-pay | Admitting: Family Medicine

## 2024-05-05 ENCOUNTER — Other Ambulatory Visit (HOSPITAL_BASED_OUTPATIENT_CLINIC_OR_DEPARTMENT_OTHER): Payer: Self-pay | Admitting: Cardiovascular Disease

## 2024-05-25 ENCOUNTER — Other Ambulatory Visit: Payer: Self-pay | Admitting: Family Medicine

## 2024-06-04 ENCOUNTER — Ambulatory Visit: Admitting: Family Medicine

## 2025-02-25 ENCOUNTER — Ambulatory Visit
